# Patient Record
Sex: Male | Born: 1945
Health system: Southern US, Community
[De-identification: ages and names within clinical notes are randomized; demographics above are authoritative.]

## PROBLEM LIST (undated history)

## (undated) DIAGNOSIS — K219 Gastro-esophageal reflux disease without esophagitis: Secondary | ICD-10-CM

## (undated) DIAGNOSIS — I890 Lymphedema, not elsewhere classified: Secondary | ICD-10-CM

## (undated) DIAGNOSIS — M1990 Unspecified osteoarthritis, unspecified site: Secondary | ICD-10-CM

## (undated) DIAGNOSIS — M1712 Unilateral primary osteoarthritis, left knee: Secondary | ICD-10-CM

## (undated) DIAGNOSIS — R609 Edema, unspecified: Secondary | ICD-10-CM

## (undated) DIAGNOSIS — F329 Major depressive disorder, single episode, unspecified: Secondary | ICD-10-CM

## (undated) DIAGNOSIS — M5137 Other intervertebral disc degeneration, lumbosacral region: Secondary | ICD-10-CM

## (undated) DIAGNOSIS — E119 Type 2 diabetes mellitus without complications: Secondary | ICD-10-CM

## (undated) DIAGNOSIS — F32A Depression, unspecified: Secondary | ICD-10-CM

## (undated) DIAGNOSIS — I509 Heart failure, unspecified: Secondary | ICD-10-CM

## (undated) DIAGNOSIS — E1140 Type 2 diabetes mellitus with diabetic neuropathy, unspecified: Secondary | ICD-10-CM

## (undated) DIAGNOSIS — I10 Essential (primary) hypertension: Secondary | ICD-10-CM

## (undated) DIAGNOSIS — K7581 Nonalcoholic steatohepatitis (NASH): Secondary | ICD-10-CM

## (undated) DIAGNOSIS — E785 Hyperlipidemia, unspecified: Secondary | ICD-10-CM

## (undated) DIAGNOSIS — D509 Iron deficiency anemia, unspecified: Secondary | ICD-10-CM

## (undated) DIAGNOSIS — M51379 Other intervertebral disc degeneration, lumbosacral region without mention of lumbar back pain or lower extremity pain: Secondary | ICD-10-CM

## (undated) DIAGNOSIS — M48061 Spinal stenosis, lumbar region without neurogenic claudication: Secondary | ICD-10-CM

## (undated) DIAGNOSIS — J449 Chronic obstructive pulmonary disease, unspecified: Secondary | ICD-10-CM

## (undated) DIAGNOSIS — I5189 Other ill-defined heart diseases: Secondary | ICD-10-CM

## (undated) HISTORY — DX: Type 2 diabetes mellitus with diabetic neuropathy, unspecified: E11.40

## (undated) HISTORY — DX: Hyperlipidemia, unspecified: E78.5

## (undated) HISTORY — DX: Nonalcoholic steatohepatitis (NASH): K75.81

## (undated) HISTORY — DX: Other intervertebral disc degeneration, lumbosacral region: M51.37

## (undated) HISTORY — DX: Chronic obstructive pulmonary disease, unspecified: J44.9

## (undated) HISTORY — DX: Edema, unspecified: R60.9

## (undated) HISTORY — PX: SHOULDER SURGERY: SHX246

## (undated) HISTORY — DX: Other ill-defined heart diseases: I51.89

## (undated) HISTORY — DX: Other intervertebral disc degeneration, lumbosacral region without mention of lumbar back pain or lower extremity pain: M51.379

## (undated) HISTORY — PX: OTHER SURGICAL HISTORY: SHX169

## (undated) HISTORY — DX: Unilateral primary osteoarthritis, left knee: M17.12

## (undated) HISTORY — DX: Spinal stenosis, lumbar region without neurogenic claudication: M48.061

## (undated) HISTORY — PX: HERNIA REPAIR: SHX51

## (undated) HISTORY — PX: TOOTH EXTRACTION: SUR596

---

## 2002-02-19 ENCOUNTER — Emergency Department (HOSPITAL_COMMUNITY): Admission: EM | Admit: 2002-02-19 | Discharge: 2002-02-20 | Payer: Self-pay | Admitting: *Deleted

## 2002-02-20 ENCOUNTER — Encounter: Payer: Self-pay | Admitting: *Deleted

## 2002-04-16 ENCOUNTER — Encounter: Payer: Self-pay | Admitting: Family Medicine

## 2002-04-16 ENCOUNTER — Ambulatory Visit (HOSPITAL_COMMUNITY): Admission: RE | Admit: 2002-04-16 | Discharge: 2002-04-16 | Payer: Self-pay | Admitting: Family Medicine

## 2002-06-02 ENCOUNTER — Encounter: Payer: Self-pay | Admitting: *Deleted

## 2002-06-02 ENCOUNTER — Emergency Department (HOSPITAL_COMMUNITY): Admission: EM | Admit: 2002-06-02 | Discharge: 2002-06-02 | Payer: Self-pay | Admitting: *Deleted

## 2002-06-22 ENCOUNTER — Encounter: Payer: Self-pay | Admitting: Family Medicine

## 2002-06-22 ENCOUNTER — Ambulatory Visit (HOSPITAL_COMMUNITY): Admission: RE | Admit: 2002-06-22 | Discharge: 2002-06-22 | Payer: Self-pay | Admitting: Family Medicine

## 2003-06-24 ENCOUNTER — Ambulatory Visit (HOSPITAL_COMMUNITY): Admission: RE | Admit: 2003-06-24 | Discharge: 2003-06-24 | Payer: Self-pay | Admitting: Internal Medicine

## 2003-06-24 ENCOUNTER — Encounter: Payer: Self-pay | Admitting: Internal Medicine

## 2003-08-19 ENCOUNTER — Ambulatory Visit (HOSPITAL_COMMUNITY): Admission: RE | Admit: 2003-08-19 | Discharge: 2003-08-19 | Payer: Self-pay | Admitting: *Deleted

## 2003-08-26 ENCOUNTER — Ambulatory Visit (HOSPITAL_COMMUNITY): Admission: RE | Admit: 2003-08-26 | Discharge: 2003-08-26 | Payer: Self-pay | Admitting: Internal Medicine

## 2003-10-24 ENCOUNTER — Ambulatory Visit (HOSPITAL_COMMUNITY): Admission: RE | Admit: 2003-10-24 | Discharge: 2003-10-24 | Payer: Self-pay | Admitting: Internal Medicine

## 2004-02-29 ENCOUNTER — Other Ambulatory Visit: Admission: RE | Admit: 2004-02-29 | Discharge: 2004-02-29 | Payer: Self-pay | Admitting: Internal Medicine

## 2004-05-10 ENCOUNTER — Ambulatory Visit (HOSPITAL_COMMUNITY): Admission: RE | Admit: 2004-05-10 | Discharge: 2004-05-10 | Payer: Self-pay | Admitting: Orthopedic Surgery

## 2004-06-15 ENCOUNTER — Ambulatory Visit (HOSPITAL_COMMUNITY): Admission: RE | Admit: 2004-06-15 | Discharge: 2004-06-15 | Payer: Self-pay | Admitting: Orthopedic Surgery

## 2004-06-19 ENCOUNTER — Encounter (HOSPITAL_COMMUNITY): Admission: RE | Admit: 2004-06-19 | Discharge: 2004-06-29 | Payer: Self-pay | Admitting: Orthopedic Surgery

## 2004-07-02 ENCOUNTER — Encounter (HOSPITAL_COMMUNITY): Admission: RE | Admit: 2004-07-02 | Discharge: 2004-08-01 | Payer: Self-pay | Admitting: Orthopedic Surgery

## 2004-08-03 ENCOUNTER — Encounter (HOSPITAL_COMMUNITY): Admission: RE | Admit: 2004-08-03 | Discharge: 2004-09-02 | Payer: Self-pay | Admitting: Orthopedic Surgery

## 2004-08-20 ENCOUNTER — Ambulatory Visit: Payer: Self-pay | Admitting: Orthopedic Surgery

## 2004-08-31 ENCOUNTER — Ambulatory Visit (HOSPITAL_COMMUNITY): Admission: RE | Admit: 2004-08-31 | Discharge: 2004-08-31 | Payer: Self-pay | Admitting: Family Medicine

## 2004-09-03 ENCOUNTER — Encounter (HOSPITAL_COMMUNITY): Admission: RE | Admit: 2004-09-03 | Discharge: 2004-10-03 | Payer: Self-pay | Admitting: Orthopedic Surgery

## 2004-09-06 ENCOUNTER — Ambulatory Visit (HOSPITAL_COMMUNITY): Admission: RE | Admit: 2004-09-06 | Discharge: 2004-09-06 | Payer: Self-pay | Admitting: Family Medicine

## 2004-09-17 ENCOUNTER — Ambulatory Visit: Payer: Self-pay | Admitting: Orthopedic Surgery

## 2004-10-03 ENCOUNTER — Ambulatory Visit: Payer: Self-pay | Admitting: Internal Medicine

## 2004-10-05 ENCOUNTER — Ambulatory Visit (HOSPITAL_COMMUNITY): Admission: RE | Admit: 2004-10-05 | Discharge: 2004-10-05 | Payer: Self-pay | Admitting: Internal Medicine

## 2004-10-05 ENCOUNTER — Encounter (HOSPITAL_COMMUNITY): Admission: RE | Admit: 2004-10-05 | Discharge: 2004-11-04 | Payer: Self-pay | Admitting: Orthopedic Surgery

## 2004-10-08 ENCOUNTER — Ambulatory Visit: Payer: Self-pay | Admitting: Orthopedic Surgery

## 2004-11-05 ENCOUNTER — Ambulatory Visit: Payer: Self-pay | Admitting: Orthopedic Surgery

## 2005-09-25 ENCOUNTER — Encounter (INDEPENDENT_AMBULATORY_CARE_PROVIDER_SITE_OTHER): Payer: Self-pay | Admitting: General Surgery

## 2005-09-25 ENCOUNTER — Ambulatory Visit (HOSPITAL_COMMUNITY): Admission: RE | Admit: 2005-09-25 | Discharge: 2005-09-25 | Payer: Self-pay | Admitting: General Surgery

## 2006-02-28 ENCOUNTER — Ambulatory Visit (HOSPITAL_COMMUNITY): Admission: RE | Admit: 2006-02-28 | Discharge: 2006-02-28 | Payer: Self-pay | Admitting: Family Medicine

## 2006-10-06 ENCOUNTER — Ambulatory Visit: Payer: Self-pay | Admitting: Internal Medicine

## 2006-10-06 DIAGNOSIS — I498 Other specified cardiac arrhythmias: Secondary | ICD-10-CM | POA: Insufficient documentation

## 2006-10-06 LAB — CONVERTED CEMR LAB
Hgb A1c MFr Bld: 7.4 %
Hgb A1c MFr Bld: 7.4 %

## 2006-10-07 ENCOUNTER — Encounter (INDEPENDENT_AMBULATORY_CARE_PROVIDER_SITE_OTHER): Payer: Self-pay | Admitting: Internal Medicine

## 2006-10-07 LAB — CONVERTED CEMR LAB
ALT: 54 units/L — ABNORMAL HIGH (ref 0–53)
AST: 38 units/L — ABNORMAL HIGH (ref 0–37)
Albumin: 4.7 g/dL (ref 3.5–5.2)
Alkaline Phosphatase: 73 units/L (ref 39–117)
BUN: 18 mg/dL (ref 6–23)
Basophils Absolute: 0.1 10*3/uL (ref 0.0–0.1)
Basophils Relative: 2 % — ABNORMAL HIGH (ref 0–1)
CO2: 22 meq/L (ref 19–32)
Calcium: 9.9 mg/dL (ref 8.4–10.5)
Chloride: 105 meq/L (ref 96–112)
Cholesterol: 252 mg/dL — ABNORMAL HIGH (ref 0–200)
Creatinine, Ser: 0.62 mg/dL (ref 0.40–1.50)
Eosinophils Relative: 2 % (ref 0–5)
Glucose, Bld: 172 mg/dL — ABNORMAL HIGH (ref 70–99)
HCT: 46.3 % (ref 39.0–52.0)
HDL: 52 mg/dL (ref >39)
Hemoglobin: 15.9 g/dL (ref 13.0–17.0)
LDL Cholesterol: 136 mg/dL — ABNORMAL HIGH (ref 0–99)
Lymphocytes Relative: 30 % (ref 12–46)
Lymphs Abs: 2.7 10*3/uL (ref 0.7–3.3)
MCHC: 34.3 g/dL (ref 30.0–36.0)
MCV: 85.4 fL (ref 78.0–100.0)
Monocytes Absolute: 0.6 10*3/uL (ref 0.2–0.7)
Monocytes Relative: 7 % (ref 3–11)
Neutro Abs: 5.3 10*3/uL (ref 1.7–7.7)
Neutrophils Relative %: 60 % (ref 43–77)
Platelets: 303 10*3/uL (ref 150–400)
Potassium: 4.4 meq/L (ref 3.5–5.3)
RBC: 5.42 M/uL (ref 4.22–5.81)
RDW: 14.2 % — ABNORMAL HIGH (ref 11.5–14.0)
Sodium: 143 meq/L (ref 135–145)
TSH: 2.718 microintl units/mL (ref 0.350–5.50)
Total Bilirubin: 0.4 mg/dL (ref 0.3–1.2)
Total CHOL/HDL Ratio: 4.8
Total Protein: 7.3 g/dL (ref 6.0–8.3)
Triglycerides: 321 mg/dL — ABNORMAL HIGH (ref <150)
VLDL: 64 mg/dL — ABNORMAL HIGH (ref 0–40)
WBC: 8.8 10*3/uL (ref 4.0–10.5)

## 2006-10-14 ENCOUNTER — Ambulatory Visit: Payer: Self-pay | Admitting: Internal Medicine

## 2006-10-15 ENCOUNTER — Encounter: Payer: Self-pay | Admitting: Internal Medicine

## 2006-10-15 DIAGNOSIS — G5600 Carpal tunnel syndrome, unspecified upper limb: Secondary | ICD-10-CM

## 2006-10-15 DIAGNOSIS — K219 Gastro-esophageal reflux disease without esophagitis: Secondary | ICD-10-CM | POA: Insufficient documentation

## 2006-10-15 DIAGNOSIS — E785 Hyperlipidemia, unspecified: Secondary | ICD-10-CM

## 2006-10-15 DIAGNOSIS — I10 Essential (primary) hypertension: Secondary | ICD-10-CM | POA: Insufficient documentation

## 2006-10-17 ENCOUNTER — Ambulatory Visit (HOSPITAL_COMMUNITY): Admission: RE | Admit: 2006-10-17 | Discharge: 2006-10-17 | Payer: Self-pay | Admitting: Internal Medicine

## 2006-11-13 ENCOUNTER — Ambulatory Visit: Payer: Self-pay | Admitting: Internal Medicine

## 2006-11-19 ENCOUNTER — Ambulatory Visit: Payer: Self-pay | Admitting: Internal Medicine

## 2006-11-19 DIAGNOSIS — B9789 Other viral agents as the cause of diseases classified elsewhere: Secondary | ICD-10-CM | POA: Insufficient documentation

## 2006-11-19 LAB — CONVERTED CEMR LAB: Inflenza A Ag: NEGATIVE

## 2007-01-01 ENCOUNTER — Ambulatory Visit: Payer: Self-pay | Admitting: Internal Medicine

## 2007-01-01 LAB — CONVERTED CEMR LAB: Hgb A1c MFr Bld: 7.4 %

## 2007-01-05 LAB — CONVERTED CEMR LAB
Creatinine, Urine: 62.3 mg/dL
Microalb Creat Ratio: 15.2 mg/g (ref 0.0–30.0)
Microalb, Ur: 0.95 mg/dL (ref 0.00–1.89)

## 2007-01-06 ENCOUNTER — Encounter (INDEPENDENT_AMBULATORY_CARE_PROVIDER_SITE_OTHER): Payer: Self-pay | Admitting: Internal Medicine

## 2007-01-08 LAB — CONVERTED CEMR LAB
ALT: 58 units/L — ABNORMAL HIGH (ref 0–53)
AST: 42 units/L — ABNORMAL HIGH (ref 0–37)
Albumin: 4.4 g/dL (ref 3.5–5.2)
Alkaline Phosphatase: 60 units/L (ref 39–117)
BUN: 10 mg/dL (ref 6–23)
CO2: 25 meq/L (ref 19–32)
Calcium: 9.6 mg/dL (ref 8.4–10.5)
Chloride: 102 meq/L (ref 96–112)
Cholesterol: 184 mg/dL (ref 0–200)
Creatinine, Ser: 0.66 mg/dL (ref 0.40–1.50)
Creatinine, Urine: 126 mg/dL
Glucose, Bld: 160 mg/dL — ABNORMAL HIGH (ref 70–99)
HDL: 46 mg/dL (ref >39)
LDL Cholesterol: 111 mg/dL — ABNORMAL HIGH (ref 0–99)
Microalb Creat Ratio: 22.2 mg/g (ref 0.0–30.0)
Microalb, Ur: 2.8 mg/dL — ABNORMAL HIGH (ref 0.00–1.89)
Potassium: 4.7 meq/L (ref 3.5–5.3)
Sodium: 143 meq/L (ref 135–145)
Total Bilirubin: 0.3 mg/dL (ref 0.3–1.2)
Total CHOL/HDL Ratio: 4
Total Protein: 6.4 g/dL (ref 6.0–8.3)
Triglycerides: 134 mg/dL (ref <150)
VLDL: 27 mg/dL (ref 0–40)

## 2007-03-05 ENCOUNTER — Ambulatory Visit: Payer: Self-pay | Admitting: Internal Medicine

## 2007-03-05 ENCOUNTER — Ambulatory Visit (HOSPITAL_COMMUNITY): Admission: RE | Admit: 2007-03-05 | Discharge: 2007-03-05 | Payer: Self-pay | Admitting: Internal Medicine

## 2007-05-20 ENCOUNTER — Ambulatory Visit: Payer: Self-pay | Admitting: Internal Medicine

## 2007-05-20 DIAGNOSIS — E1149 Type 2 diabetes mellitus with other diabetic neurological complication: Secondary | ICD-10-CM

## 2007-05-20 LAB — CONVERTED CEMR LAB
Blood Glucose, Fingerstick: 184
Hgb A1c MFr Bld: 7.7 %

## 2007-05-21 ENCOUNTER — Encounter (INDEPENDENT_AMBULATORY_CARE_PROVIDER_SITE_OTHER): Payer: Self-pay | Admitting: Internal Medicine

## 2007-05-21 ENCOUNTER — Telehealth (INDEPENDENT_AMBULATORY_CARE_PROVIDER_SITE_OTHER): Payer: Self-pay | Admitting: *Deleted

## 2007-05-21 LAB — CONVERTED CEMR LAB
HCV Ab: NEGATIVE
Hep B S Ab: NEGATIVE
Hepatitis B Surface Ag: NEGATIVE

## 2007-05-22 ENCOUNTER — Telehealth (INDEPENDENT_AMBULATORY_CARE_PROVIDER_SITE_OTHER): Payer: Self-pay | Admitting: *Deleted

## 2007-05-22 DIAGNOSIS — K7581 Nonalcoholic steatohepatitis (NASH): Secondary | ICD-10-CM | POA: Insufficient documentation

## 2007-05-22 LAB — CONVERTED CEMR LAB
ALT: 80 units/L — ABNORMAL HIGH (ref 0–53)
AST: 75 units/L — ABNORMAL HIGH (ref 0–37)
Albumin: 4.6 g/dL (ref 3.5–5.2)
Alkaline Phosphatase: 64 units/L (ref 39–117)
BUN: 11 mg/dL (ref 6–23)
Basophils Absolute: 0.1 10*3/uL (ref 0.0–0.1)
Basophils Relative: 2 % — ABNORMAL HIGH (ref 0–1)
CO2: 24 meq/L (ref 19–32)
Calcium: 9.7 mg/dL (ref 8.4–10.5)
Chloride: 103 meq/L (ref 96–112)
Cholesterol: 176 mg/dL (ref 0–200)
Creatinine, Ser: 0.64 mg/dL (ref 0.40–1.50)
Eosinophils Absolute: 0.2 10*3/uL (ref 0.0–0.7)
Eosinophils Relative: 3 % (ref 0–5)
Glucose, Bld: 161 mg/dL — ABNORMAL HIGH (ref 70–99)
HCT: 44.9 % (ref 39.0–52.0)
HDL: 46 mg/dL (ref >39)
Hemoglobin: 14 g/dL (ref 13.0–17.0)
LDL Cholesterol: 97 mg/dL (ref 0–99)
Lymphocytes Relative: 36 % (ref 12–46)
Lymphs Abs: 2 10*3/uL (ref 0.7–3.3)
MCHC: 31.2 g/dL (ref 30.0–36.0)
MCV: 87 fL (ref 78.0–100.0)
Monocytes Absolute: 0.3 10*3/uL (ref 0.2–0.7)
Monocytes Relative: 6 % (ref 3–11)
Neutro Abs: 2.9 10*3/uL (ref 1.7–7.7)
Neutrophils Relative %: 53 % (ref 43–77)
Platelets: 250 10*3/uL (ref 150–400)
Potassium: 4.7 meq/L (ref 3.5–5.3)
RBC: 5.16 M/uL (ref 4.22–5.81)
RDW: 15.1 % — ABNORMAL HIGH (ref 11.5–14.0)
Sodium: 140 meq/L (ref 135–145)
Total Bilirubin: 0.6 mg/dL (ref 0.3–1.2)
Total CHOL/HDL Ratio: 3.8
Total Protein: 6.8 g/dL (ref 6.0–8.3)
Triglycerides: 163 mg/dL — ABNORMAL HIGH (ref <150)
VLDL: 33 mg/dL (ref 0–40)
WBC: 5.4 10*3/uL (ref 4.0–10.5)

## 2007-06-22 ENCOUNTER — Encounter (INDEPENDENT_AMBULATORY_CARE_PROVIDER_SITE_OTHER): Payer: Self-pay | Admitting: Internal Medicine

## 2007-08-21 ENCOUNTER — Encounter (INDEPENDENT_AMBULATORY_CARE_PROVIDER_SITE_OTHER): Payer: Self-pay | Admitting: Family Medicine

## 2007-09-01 ENCOUNTER — Telehealth (INDEPENDENT_AMBULATORY_CARE_PROVIDER_SITE_OTHER): Payer: Self-pay | Admitting: *Deleted

## 2007-09-01 ENCOUNTER — Encounter (INDEPENDENT_AMBULATORY_CARE_PROVIDER_SITE_OTHER): Payer: Self-pay | Admitting: Internal Medicine

## 2007-09-14 ENCOUNTER — Encounter (INDEPENDENT_AMBULATORY_CARE_PROVIDER_SITE_OTHER): Payer: Self-pay | Admitting: Family Medicine

## 2008-03-08 ENCOUNTER — Encounter (INDEPENDENT_AMBULATORY_CARE_PROVIDER_SITE_OTHER): Payer: Self-pay | Admitting: Internal Medicine

## 2009-09-30 HISTORY — PX: OTHER SURGICAL HISTORY: SHX169

## 2009-09-30 HISTORY — PX: COLONOSCOPY: SHX174

## 2011-02-15 NOTE — Procedures (Signed)
NAME:  ZAYVEN, POWE NO.:  [id number]   MEDICAL RECORD NO.:  [id number]          PATIENT TYPE:  OUT   LOCATION:  RAD                           FACILITY:  [facility]   PHYSICIAN:  Fay Records, MD, FACCDATE OF BIRTH:  Jul 24, 1946   DATE OF PROCEDURE:  10/17/2006  DATE OF DISCHARGE:                                ECHOCARDIOGRAM   TEST INDICATIONS:  The patient is a 65 year old with a history of  palpitations, tachycardia, and hypertension.   2D ECHO WITH ECHO DOPPLER:  Left ventricle is mildly thickened,  interventricular septum at 14 mm, posterior wall at 15 mm.  Left atrium  is mildly dilated. Right atrium and right ventricle are normal.   The aortic root is normal.   The aortic valve is mildly thickened.  There is no insufficiency.  No  stenosis.  The mitral valve is mildly thickened with mild annular  calcification.  There is no insufficiency.  The pulmonic valve is  normal.  No insufficiency.  The tricuspid valve is normal with no  insufficiency.   Overall LV systolic function is normal with an LVEF of approximately  60%.  RVEF is normal.  Note very mild diastolic dysfunction.  No  pericardial effusion is seen.      Fay Records, MD, [facility]  Electronically Signed     PVR/MEDQ  D:  10/17/2006  T:  10/17/2006  Job:  [id number]

## 2011-02-15 NOTE — Op Note (Signed)
NAME:  Michael [name], SCHEFFEL NO.:  [id number]   MEDICAL RECORD NO.:  [id number]          PATIENT TYPE:  AMB   LOCATION:  DAY                           FACILITY:  [facility]   PHYSICIAN:  Jamesetta So, M.D.  DATE OF BIRTH:  [birth year]   DATE OF PROCEDURE:  09/25/2005  DATE OF DISCHARGE:                                 OPERATIVE REPORT   PREOPERATIVE DIAGNOSIS:  Ventral hernia.   POSTOPERATIVE DIAGNOSIS:  Ventral hernia.   PROCEDURE:  Ventral herniorrhaphy with mesh.   SURGEON:  Dr. Aviva Signs.   ANESTHESIA:  General endotracheal.   INDICATIONS:  The patient is a 65 year old white male who is referred for  evaluation and treatment of a ventral hernia. Risks and benefits of the  procedure including bleeding, infection and recurrence of the hernia were  fully explained to the patient, who gave informed consent.   PROCEDURE NOTE:  The patient was placed in the supine position. After  induction of general endotracheal anesthesia, the abdomen was prepped and  draped using the usual sterile technique with Betadine. Surgical site  confirmation was performed.   A midline incision was made just above the umbilicus. This was taken down to  the fascia. The patient was noted to have a hernia sac with omentum within  it. The hernia sac was excised to the fascial edges without difficulty. It  was sent to pathology for further examination. The omentum was reduced into  the abdominal cavity. Polypropylene mesh patch was then secured  circumferentially to the fascia using 0 Prolene interrupted sutures. This  defect measured approximately 3 cm in its greatest diameter. The  subcutaneous layer was reapproximated using 2-0 Vicryl interrupted sutures.  The skin was closed using staples. Sensorcaine 0.5% was instilled into the  surrounding wound. Betadine ointment and dry sterile dressing were applied.   All tape and needle counts were correct at the end of the procedure. The  patient  was extubated in the operating room and went back to recovery room  awake in stable condition.   COMPLICATIONS:  None.   SPECIMEN:  Hernia sac.   BLOOD LOSS:  Minimal.      Jamesetta So, M.D.  Electronically Signed     MAJ/MEDQ  D:  09/25/2005  T:  09/25/2005  Job:  [id number]

## 2011-02-15 NOTE — H&P (Signed)
NAME:  Michael [name], Michael [name] NO.:  [id number]   MEDICAL RECORD NO.:  [id number]          PATIENT TYPE:  AMB   LOCATION:                                FACILITY:  [facility]   PHYSICIAN:  Jamesetta So, M.D.  DATE OF BIRTH:  01/08/1946   DATE OF ADMISSION:  DATE OF DISCHARGE:  LH                                HISTORY & PHYSICAL   CHIEF COMPLAINT:  Ventral hernia.   HISTORY OF PRESENT ILLNESS:  The patient is a 65 year old white male who is  referred for evaluation and treatment of a ventral hernia. It has been  present for some time but is increasing in size and is causing him  discomfort. It is made worse with straining.   PAST MEDICAL HISTORY:  1.  Hypertension.  2.  Noninsulin-dependent diabetes mellitus.   PAST SURGICAL HISTORY:  Shoulder surgery.   CURRENT MEDICATIONS:  Januvia, Lotrel, Vytorin, Nexium.   ALLERGIES:  No known drug allergies.   REVIEW OF SYSTEMS:  The patient denies drinking or smoking.   PHYSICAL EXAMINATION:  GENERAL:  The patient is a well-developed, well-  nourished, white male in no acute distress.  LUNGS:  Clear to auscultation with equal breath sounds bilaterally.  HEART:  Reveals regular rate and rhythm without S3, S4, or murmurs.  ABDOMEN:  Soft and nondistended. He is tender in the supraumbilical region  with a hernia present with 5 cm in size. No hepatosplenomegaly or masses are  noted.   IMPRESSION:  Ventral hernia.   PLAN:  The patient is scheduled for ventral herniorrhaphy on September 25, 2005. The risks and benefits of the procedure including bleeding, infection  and the possibility of recurrence of the hernia were fully explained to the  patient, who gave informed consent.      Jamesetta So, M.D.  Electronically Signed     MAJ/MEDQ  D:  09/12/2005  T:  09/12/2005  Job:  [id number]   cc:   [facility]  Fax: [phone]   Leonides Grills, M.D.  Fax: [phone]

## 2012-05-07 ENCOUNTER — Encounter: Payer: Self-pay | Admitting: Family Medicine

## 2012-05-07 ENCOUNTER — Ambulatory Visit (INDEPENDENT_AMBULATORY_CARE_PROVIDER_SITE_OTHER): Payer: Medicare Other | Admitting: Family Medicine

## 2012-05-07 ENCOUNTER — Ambulatory Visit (HOSPITAL_COMMUNITY)
Admission: RE | Admit: 2012-05-07 | Discharge: 2012-05-07 | Disposition: A | Discharge status: Home / Self Care | Payer: Medicare Other | Source: Ambulatory Visit | Attending: Family Medicine | Admitting: Family Medicine

## 2012-05-07 VITALS — BP 120/70 | HR 103 | Resp 16 | Ht 63.0 in | Wt 211.4 lb

## 2012-05-07 DIAGNOSIS — M25569 Pain in unspecified knee: Secondary | ICD-10-CM

## 2012-05-07 DIAGNOSIS — E785 Hyperlipidemia, unspecified: Secondary | ICD-10-CM

## 2012-05-07 DIAGNOSIS — E119 Type 2 diabetes mellitus without complications: Secondary | ICD-10-CM

## 2012-05-07 DIAGNOSIS — I10 Essential (primary) hypertension: Secondary | ICD-10-CM

## 2012-05-07 DIAGNOSIS — M25561 Pain in right knee: Secondary | ICD-10-CM

## 2012-05-07 NOTE — Patient Instructions (Addendum)
Get the xrays done of the knees-  Continue the current medications Call me when you get the Medicaid F/U 8 weeks

## 2012-05-07 NOTE — Progress Notes (Signed)
  Subjective:    Patient ID: Michael [name], male    DOB: 1946/02/24, 66 y.o.   MRN: [id number]  HPI Pt here to establish care, [place] vet- [employer], followed at [facility] Medications and History reviewed Now on medicare and wishes to establish locally as transportation to [place] very difficult. Chronic knee pain- history of bone spur and OA, given NSAIDS and ultram , has not seen ortho because of waiting list at [facility] DM- last A1C 6.7 in June 2013, has neuropathy as well, on Novolog 70/30 17 units BID Anemia- had transfuion in 2011, seen by hematology, ? Slow GI bleed, had capsule endoscopy, colonoscopy and EGD all normal per report    Review of Systems   GEN- denies fatigue, fever, weight loss,weakness, recent illness HEENT- denies eye drainage, change in vision, nasal discharge, CVS- denies chest pain, palpitations RESP- denies SOB, cough, wheeze ABD- denies N/V, change in stools, abd pain GU- denies dysuria, hematuria, dribbling, incontinence MSK- + joint pain, muscle aches, injury Neuro- denies headache, dizziness, syncope, seizure activity      Objective:   Physical Exam GEN- NAD, alert and oriented x3,  HEENT- PERRL, EOMI, non injected sclera, pink conjunctiva, MMM, oropharynx clear Neck- Supple,  CVS- RRR, no murmur RESP-CTAB ABD-NABS,soft,NT,ND  EXT-trace edema, small varicose veins, mild erythematous hyperpigementation, TTP from calf to ankle  Pulses- Radial, DP- 2+ MSK- Bilateral knee- normal inspection,no effusion, no crepitus, TTP medial and inferior aspects L>R antaglic gait       Assessment & Plan:

## 2012-05-10 ENCOUNTER — Encounter: Payer: Self-pay | Admitting: Family Medicine

## 2012-05-10 NOTE — Assessment & Plan Note (Signed)
Well controlled, no change to meds 

## 2012-05-10 NOTE — Assessment & Plan Note (Signed)
Last A1C looks good, continue 70/30 he has precison xtra monitoring system

## 2012-05-10 NOTE — Assessment & Plan Note (Signed)
Obtain last lipid profile on statin therapy

## 2012-05-10 NOTE — Assessment & Plan Note (Signed)
Xrays show mild arthritis changes, he wants to establish medicaid before seeing ortho for ongoing pain, needs weight loss Continue ultram

## 2012-05-10 NOTE — Assessment & Plan Note (Signed)
Little activity with knee pain

## 2012-06-11 ENCOUNTER — Encounter: Payer: Self-pay | Admitting: Family Medicine

## 2012-06-11 ENCOUNTER — Ambulatory Visit (INDEPENDENT_AMBULATORY_CARE_PROVIDER_SITE_OTHER): Payer: Medicare Other | Admitting: Family Medicine

## 2012-06-11 VITALS — BP 140/74 | HR 100 | Resp 18 | Ht 63.0 in | Wt 211.0 lb

## 2012-06-11 DIAGNOSIS — E119 Type 2 diabetes mellitus without complications: Secondary | ICD-10-CM

## 2012-06-11 DIAGNOSIS — M25561 Pain in right knee: Secondary | ICD-10-CM

## 2012-06-11 DIAGNOSIS — M79606 Pain in leg, unspecified: Secondary | ICD-10-CM

## 2012-06-11 DIAGNOSIS — M25569 Pain in unspecified knee: Secondary | ICD-10-CM

## 2012-06-11 DIAGNOSIS — M79609 Pain in unspecified limb: Secondary | ICD-10-CM

## 2012-06-11 DIAGNOSIS — Z23 Encounter for immunization: Secondary | ICD-10-CM

## 2012-06-11 DIAGNOSIS — Z79899 Other long term (current) drug therapy: Secondary | ICD-10-CM

## 2012-06-11 MED ORDER — HYDROCODONE-ACETAMINOPHEN 5-500 MG PO TABS: 1.0000 | ORAL_TABLET | Freq: Four times a day (QID) | ORAL | Status: DC | PRN

## 2012-06-11 NOTE — Patient Instructions (Addendum)
I will refer you to orthopedics for your knees Start the pain medication as needed I will have the blood flow checked in your legs  Flu shot given/ pneumonia vaccine Get the labs done fasting- we will call with results  Changes f/u in 2 months

## 2012-06-13 LAB — CBC
Hemoglobin: 11.7 g/dL — ABNORMAL LOW (ref 13.0–17.0)
MCHC: 33.5 g/dL (ref 30.0–36.0)
Platelets: 239 10*3/uL (ref 150–400)
RDW: 16.2 % — ABNORMAL HIGH (ref 11.5–15.5)

## 2012-06-13 LAB — COMPREHENSIVE METABOLIC PANEL
Alkaline Phosphatase: 53 U/L (ref 39–117)
Creat: 0.75 mg/dL (ref 0.50–1.35)
Glucose, Bld: 142 mg/dL — ABNORMAL HIGH (ref 70–99)
Sodium: 139 mEq/L (ref 135–145)
Total Bilirubin: 0.5 mg/dL (ref 0.3–1.2)
Total Protein: 6.9 g/dL (ref 6.0–8.3)

## 2012-06-13 LAB — LIPID PANEL
LDL Cholesterol: 75 mg/dL (ref 0–99)
Total CHOL/HDL Ratio: 3.2 Ratio
Triglycerides: 108 mg/dL (ref <150)
VLDL: 22 mg/dL (ref 0–40)

## 2012-06-13 LAB — VITAMIN B12: Vitamin B-12: 483 pg/mL (ref 211–911)

## 2012-06-13 LAB — HEMOGLOBIN A1C: Mean Plasma Glucose: 160 mg/dL — ABNORMAL HIGH (ref <117)

## 2012-06-14 ENCOUNTER — Encounter: Payer: Self-pay | Admitting: Family Medicine

## 2012-06-14 NOTE — Assessment & Plan Note (Signed)
I think his pain in calves and thighs may be referred from knee, he is diabetic, therefore with fatigue with walking will check ABI

## 2012-06-14 NOTE — Progress Notes (Signed)
  Subjective:    Patient ID: Michael [name], male    DOB: 04-May-1946, 66 y.o.   MRN: [id number]  HPI Pt presents with continue knee pain L>R, his legs are also painful,they feels very tired after walking, his knee pops and gives out on him with walking, no swelling. Ultram did not help   Review of Systems - per above  GEN- denies fatigue, fever, weight loss,weakness, recent illness HEENT- denies eye drainage, change in vision, nasal discharge, CVS- denies chest pain, palpitations GU- denies dysuria, hematuria, dribbling, incontinence MSK- + joint pain, muscle aches, injury       Objective:   Physical Exam  GEN-NAD, alert and oriented x3, short stature MSK- Bilateral knee- normal inspection, while ambulating left knee gave out on pt in office, no swelling,liagmens in tact, TTP 2cm above knee, medal and lateral aspects Ext-trace pedal edema, neg homans Pulses- equal bilat      Assessment & Plan:

## 2012-06-14 NOTE — Assessment & Plan Note (Signed)
Vicodin given, referral to ortho

## 2012-07-01 ENCOUNTER — Encounter: Payer: Self-pay | Admitting: Orthopedic Surgery

## 2012-07-01 ENCOUNTER — Ambulatory Visit (INDEPENDENT_AMBULATORY_CARE_PROVIDER_SITE_OTHER): Payer: Medicare Other | Admitting: Orthopedic Surgery

## 2012-07-01 VITALS — BP 138/60 | Ht 63.0 in | Wt 211.0 lb

## 2012-07-01 DIAGNOSIS — M7650 Patellar tendinitis, unspecified knee: Secondary | ICD-10-CM | POA: Insufficient documentation

## 2012-07-01 MED ORDER — DICLOFENAC SODIUM 1 % TD GEL: 1.0000 "application " | Freq: Four times a day (QID) | TRANSDERMAL | Status: DC

## 2012-07-01 NOTE — Patient Instructions (Addendum)
Patellar Tendinitis, Jumper's Knee with Rehab Tendinitis is inflammation of a tendon. Tendonitis of the tendon below the kneecap (patella) is known as patellar tendonitis. Patellar tendonitis is a common cause of pain below the kneecap (infrapatella). Patellar tendonitis may involve a tear (strain) in the ligament. Strains are classified into three categories. Grade 1 strains cause pain, but the tendon is not lengthened. Grade 2 strains include a lengthened ligament, due to the ligament being stretched or partially ruptured. With grade 2 strains there is still function, although function may be decreased. Grade 3 strains involve a complete tear of the tendon or muscle, and function is usually impaired. Patellar tendon strains are usually grade 1 or 2.   SYMPTOMS    Pain, tenderness, swelling, warmth, or redness over the patellar tendon (just below the kneecap).   Pain and loss of strength (sometimes), with forcefully straightening the knee (especially when jumping or rising from a seated or squatting position), or bending the knee completely (squatting or kneeling).   Crackling sound (crepitation) when the tendon is moved or touched.  CAUSES   Patellar tendonitis is caused by injury to the patellar tendon. The inflammation is the body's healing response. Common causes of injury include:  Stress from a sudden increase in intensity, frequency, or duration of training.   Overuse of the quadriceps thigh muscles and patellar tendon.   Direct hit (trauma) to the knee or patellar tendon.  RISK INCREASES WITH:  Sports that require sudden, explosive thigh muscle (quadriceps) contraction, such as jumping, quick starts, or kicking.   Running sports, especially running down hills.   Poor strength and flexibility of the thigh and knee.   Flat feet.  PREVENTION  Warm up and stretch properly before activity.   Allow for adequate recovery between workouts.   Maintain physical fitness:   Strength,  flexibility, and endurance.   Cardiovascular fitness.   Protect the knee joint with taping, protective strapping, bracing, or elastic compression bandage.   Wear arch supports (orthotics).  PROGNOSIS   If treated properly, patellar tendonitis usually heals within 6 weeks.   RELATED COMPLICATIONS    Longer healing time, if not properly treated or if not given enough time to heal.   Recurring symptoms, if activity is resumed too soon, with overuse, with a direct blow, or when using poor technique.   If untreated, tendon rupture requiring surgery.  TREATMENT Treatment first involves the use of ice and medicine, to reduce pain and inflammation. The use of strengthening and stretching exercises may help reduce pain with activity. These exercises may be performed at home or with a therapist. Serious cases of tendonitis may require restraining the knee for 10 to 14 days, to prevent stress on the tendon and to promote healing. Crutches may be used (uncommon) until you can walk without a limp. For cases in which non-surgical treatment is unsuccessful, surgery may be advised, to remove the inflamed tendon lining (sheath). Surgery is rare, and is only advised after at least 6 months of non-surgical treatment. MEDICATION    If pain medicine is needed, nonsteroidal anti-inflammatory medicines (aspirin and ibuprofen), or other minor pain relievers (acetaminophen), are often advised.   Do not take pain medicine for 7 days before surgery.   Prescription pain relievers may be given, if your caregiver thinks they are needed. Use only as directed and only as much as you need.  HEAT AND COLD  Cold treatment (icing) should be applied for 10 to 15 minutes every 2 to 3  hours for inflammation and pain, and immediately after activity that aggravates your symptoms. Use ice packs or an ice massage.   Heat treatment may be used before performing stretching and strengthening activities prescribed by your caregiver,  physical therapist, or athletic trainer. Use a heat pack or a warm water soak.  SEEK MEDICAL CARE IF:  Symptoms get worse or do not improve in 2 weeks, despite treatment.   New, unexplained symptoms develop. (Drugs used in treatment may produce side effects.)    Start PT, call hospital   You have received a steroid shot. 15% of patients experience increased pain at the injection site with in the next 24 hours. This is best treated with ice and tylenol extra strength 2 tabs every 8 hours. If you are still having pain please call the office.

## 2012-07-01 NOTE — Progress Notes (Signed)
Patient ID: Michael [name], male   DOB: April 10, 1946, 66 y.o.   MRN: [id number] Chief Complaint  Patient presents with  . Knee Pain    bilateral knee pain x 1 year, gradual onset, no known injury    REFERRAL : DR Buelah Manis   The patient complains of bilateral anterior knee pain, which started a year ago, and worse over the last 6 months unrelieved by tramadol, and hydrocodone.  He has trouble climbing steps. He is having pain at night with catching. The LEFT knee is worse than the RIGHT. His pain is described as sharp, dull, throbbing, stabbing, and 10 out of 10, and it has become constant. He is also on Lodine, and that hasn't helped as well.  History of fatigue and joint pain joint swelling muscle pain. Otherwise, he lists his review of systems as normal. Current Outpatient Prescriptions on File Prior to Visit  Medication Sig Dispense Refill  . albuterol (PROVENTIL HFA;VENTOLIN HFA) 108 (90 BASE) MCG/ACT inhaler Inhale 2 puffs into the lungs every 6 (six) hours as needed.      Marland Kitchen amLODipine (NORVASC) 10 MG tablet Take 10 mg by mouth daily.      Marland Kitchen aspirin 81 MG tablet Take 81 mg by mouth daily.      . benazepril (LOTENSIN) 40 MG tablet Take 40 mg by mouth daily.      . cloNIDine (CATAPRES) 0.1 MG tablet Take 0.1 mg by mouth 2 (two) times daily.      Marland Kitchen etodolac (LODINE) 400 MG tablet Take 400 mg by mouth 2 (two) times daily.      . ferrous sulfate 325 (65 FE) MG tablet Take 325 mg by mouth. 2 tabs three times daily      . gabapentin (NEURONTIN) 300 MG capsule Take 300 mg by mouth 3 (three) times daily.      Marland Kitchen glipiZIDE (GLUCOTROL) 5 MG tablet Take 10 mg by mouth 2 (two) times daily before a meal.      . hydrochlorothiazide (HYDRODIURIL) 25 MG tablet Take 25 mg by mouth daily.      Marland Kitchen HYDROcodone-acetaminophen (VICODIN) 5-500 MG per tablet Take 1 tablet by mouth every 6 (six) hours as needed for pain.  30 tablet  1  . insulin aspart protamine-insulin aspart (NOVOLOG 70/30) (70-30) 100 UNIT/ML  injection Inject 17 Units into the skin 2 (two) times daily with a meal.      . metFORMIN (GLUCOPHAGE) 1000 MG tablet Take 1,000 mg by mouth 2 (two) times daily with a meal.      . omeprazole (PRILOSEC) 20 MG capsule Take 20 mg by mouth 2 (two) times daily.      . simvastatin (ZOCOR) 40 MG tablet Take 20 mg by mouth every evening.      . traMADol (ULTRAM) 50 MG tablet Take 50 mg by mouth 3 (three) times daily.      . vitamin C (ASCORBIC ACID) 500 MG tablet Take 500 mg by mouth daily.        Past Medical History  Diagnosis Date  . COPD (chronic obstructive pulmonary disease)   . Diabetes mellitus   . Hyperlipidemia   . Hypertension   . Anemia   . NASH (nonalcoholic steatohepatitis)    Past Surgical History  Procedure Date  . Hernia repair   . Shoulder surgery     right  . Laparoscopy     stomach    BP 138/60  Ht 5' 3"  (1.6 m)  Wt 211 lb (  95.709 kg)  BMI 37.38 kg/m2  Vital signs are stable as recorded  General appearance is normal  The patient is alert and oriented x3  The patient's mood and affect are normal  Gait assessment: difficulty with ambulation on with significant limp  The cardiovascular exam reveals normal pulses and temperature without edema swelling.  The lymphatic system is negative for palpable lymph nodes  The sensory exam is normal.  There are no pathologic reflexes.  Balance is normal.   Exam of the knees Inspection anterior knee pain and tenderness over the patellar tendon  Range of motion normal  Stability normal ligaments  Strength normal  Skin normal   (X-rays were done at the hospital: I've reviewed the report and the films and I agree with the report )  Minimal degenerative changes in both x-rays.  Impression bilateral patellar tendinitis.  Recommend physical therapy.  Bilateral cortisone injections.  voltaren Gel 4 times a day.  Followup 3 months   Knee  Injection Procedure Note  Pre-operative Diagnosis: left knee oa    Post-operative Diagnosis: same  Indications: pain  Anesthesia: ethyl chloride   Procedure Details   Verbal consent was obtained for the procedure. Time out was completed.The joint was prepped with alcohol, followed by  Ethyl chloride spray and A 20 gauge needle was inserted into the knee via lateral approach; 49m 1% lidocaine and 1 ml of depomedrol  was then injected into the joint . The needle was removed and the area cleansed and dressed.  Complications:  None; patient tolerated the procedure well. Knee  Injection Procedure Note  Pre-operative Diagnosis: right knee oa  Post-operative Diagnosis: same  Indications: pain  Anesthesia: ethyl chloride   Procedure Details   Verbal consent was obtained for the procedure. Time out was completed.The joint was prepped with alcohol, followed by  Ethyl chloride spray and A 20 gauge needle was inserted into the knee via lateral approach; 471m1% lidocaine and 1 ml of depomedrol  was then injected into the joint . The needle was removed and the area cleansed and dressed.  Complications:  None; patient tolerated the procedure well.

## 2012-07-02 ENCOUNTER — Ambulatory Visit: Payer: Medicare Other | Admitting: Family Medicine

## 2012-07-02 ENCOUNTER — Other Ambulatory Visit: Payer: Self-pay | Admitting: *Deleted

## 2012-07-02 DIAGNOSIS — M7650 Patellar tendinitis, unspecified knee: Secondary | ICD-10-CM

## 2012-07-03 ENCOUNTER — Encounter: Payer: Self-pay | Admitting: Family Medicine

## 2012-07-03 ENCOUNTER — Other Ambulatory Visit: Payer: Self-pay | Admitting: Family Medicine

## 2012-07-03 ENCOUNTER — Ambulatory Visit (INDEPENDENT_AMBULATORY_CARE_PROVIDER_SITE_OTHER): Payer: Medicare Other | Admitting: Family Medicine

## 2012-07-03 ENCOUNTER — Telehealth: Payer: Self-pay | Admitting: Family Medicine

## 2012-07-03 VITALS — BP 146/78 | HR 98 | Resp 15 | Ht 63.0 in | Wt 214.0 lb

## 2012-07-03 DIAGNOSIS — M25561 Pain in right knee: Secondary | ICD-10-CM

## 2012-07-03 DIAGNOSIS — I10 Essential (primary) hypertension: Secondary | ICD-10-CM

## 2012-07-03 DIAGNOSIS — E119 Type 2 diabetes mellitus without complications: Secondary | ICD-10-CM

## 2012-07-03 DIAGNOSIS — M79606 Pain in leg, unspecified: Secondary | ICD-10-CM

## 2012-07-03 DIAGNOSIS — M25569 Pain in unspecified knee: Secondary | ICD-10-CM

## 2012-07-03 DIAGNOSIS — M79609 Pain in unspecified limb: Secondary | ICD-10-CM

## 2012-07-03 MED ORDER — HYDROCODONE-ACETAMINOPHEN 5-500 MG PO TABS: 1.0000 | ORAL_TABLET | Freq: Four times a day (QID) | ORAL | Status: DC | PRN

## 2012-07-03 NOTE — Telephone Encounter (Signed)
Noted  

## 2012-07-03 NOTE — Progress Notes (Signed)
  Subjective:    Patient ID: Michael [name], male    DOB: 1946/03/14, 66 y.o.   MRN: [id number]  HPI Patient here to follow chronic medical problems. He's been seen by orthopedics for his joint pain is to have physical therapy. He request a handicap sticker temporary he is also using a cane at this time. Blood sugars have been low 100s. His last A1c was 7.2% He took his blood pressure medication just prior to visit   Review of Systems  GEN- denies fatigue, fever, weight loss,weakness, recent illness HEENT- denies eye drainage, change in vision, nasal discharge, CVS- denies chest pain, palpitations RESP- denies SOB, cough, wheeze ABD- denies N/V, change in stools, abd pain GU- denies dysuria, hematuria, dribbling, incontinence MSK- + joint pain, muscle aches, injury Neuro- denies headache, dizziness, syncope, seizure activity      Objective:   Physical Exam GEN- NAD, alert and oriented x3, obese,walks with four point cane  HEENT- PERRL, EOMI, non injected sclera, pink conjunctiva, MMM, oropharynx clear Neck- Supple, no thryomegaly CVS- RRR, no murmur RESP-CTAB EXT- trace edema, varicose veins Pulses- Radial, DP- 2+        Assessment & Plan:

## 2012-07-03 NOTE — Assessment & Plan Note (Signed)
BP suboptimal today, recently took meds, trend, no change

## 2012-07-03 NOTE — Assessment & Plan Note (Signed)
Patellar tendinitis, s/p injections, given temporary handicap form

## 2012-07-03 NOTE — Assessment & Plan Note (Signed)
A1C 7.2%, doing well no change to meds on ACEI He plans to schedule podiatry exam

## 2012-07-03 NOTE — Assessment & Plan Note (Signed)
ABI to be scheduled, + risk factors for PAD

## 2012-07-03 NOTE — Assessment & Plan Note (Signed)
Weight gain 3lbs noted, discussed diet, he is limited in activity currently with his knees and is in PT

## 2012-07-03 NOTE — Patient Instructions (Signed)
Call Dr. Berline Lopes for your foot exam  Pain meds refilled Handi-cap form  F/U 3 months

## 2012-07-13 ENCOUNTER — Other Ambulatory Visit: Payer: Self-pay | Admitting: Family Medicine

## 2012-07-13 DIAGNOSIS — E119 Type 2 diabetes mellitus without complications: Secondary | ICD-10-CM

## 2012-07-13 DIAGNOSIS — M79606 Pain in leg, unspecified: Secondary | ICD-10-CM

## 2012-07-14 ENCOUNTER — Ambulatory Visit (HOSPITAL_COMMUNITY): Payer: Medicare Other

## 2012-07-14 ENCOUNTER — Ambulatory Visit (HOSPITAL_COMMUNITY)
Admission: RE | Admit: 2012-07-14 | Discharge: 2012-07-14 | Disposition: A | Discharge status: Home / Self Care | Payer: Medicare Other | Source: Ambulatory Visit | Attending: Orthopedic Surgery | Admitting: Orthopedic Surgery

## 2012-07-14 ENCOUNTER — Ambulatory Visit (HOSPITAL_COMMUNITY)
Admission: RE | Admit: 2012-07-14 | Discharge: 2012-07-14 | Disposition: A | Discharge status: Home / Self Care | Payer: Medicare Other | Source: Ambulatory Visit | Attending: Family Medicine | Admitting: Family Medicine

## 2012-07-14 DIAGNOSIS — E785 Hyperlipidemia, unspecified: Secondary | ICD-10-CM | POA: Insufficient documentation

## 2012-07-14 DIAGNOSIS — M6281 Muscle weakness (generalized): Secondary | ICD-10-CM | POA: Insufficient documentation

## 2012-07-14 DIAGNOSIS — E119 Type 2 diabetes mellitus without complications: Secondary | ICD-10-CM | POA: Insufficient documentation

## 2012-07-14 DIAGNOSIS — M79609 Pain in unspecified limb: Secondary | ICD-10-CM | POA: Insufficient documentation

## 2012-07-14 DIAGNOSIS — M7650 Patellar tendinitis, unspecified knee: Secondary | ICD-10-CM | POA: Diagnosis present

## 2012-07-14 DIAGNOSIS — M25469 Effusion, unspecified knee: Secondary | ICD-10-CM | POA: Insufficient documentation

## 2012-07-14 DIAGNOSIS — I10 Essential (primary) hypertension: Secondary | ICD-10-CM | POA: Insufficient documentation

## 2012-07-14 DIAGNOSIS — R262 Difficulty in walking, not elsewhere classified: Secondary | ICD-10-CM | POA: Insufficient documentation

## 2012-07-14 DIAGNOSIS — IMO0001 Reserved for inherently not codable concepts without codable children: Secondary | ICD-10-CM | POA: Insufficient documentation

## 2012-07-14 DIAGNOSIS — M79606 Pain in leg, unspecified: Secondary | ICD-10-CM

## 2012-07-14 DIAGNOSIS — M25569 Pain in unspecified knee: Secondary | ICD-10-CM | POA: Insufficient documentation

## 2012-07-14 DIAGNOSIS — N644 Mastodynia: Secondary | ICD-10-CM | POA: Insufficient documentation

## 2012-07-14 NOTE — Evaluation (Signed)
Physical Therapy Evaluation  Patient Details  Name: Michael [name] MRN: [id number] Date of Birth: [birth year]  Today's Date: 07/14/2012 Time: 1400-1440 PT Time Calculation (min): 40 min Charges: 1 eval, 1 ice, 8' TE Visit#: 1  of 8   Re-eval: 08/13/12 Assessment Diagnosis: B patellar tendonitis Next MD Visit: Dr. Aline Brochure - unscheduled Prior Therapy: [facility] and was D/C secondary to inability to pay co-pays  Authorization: [facility] medicare  Authorization Time Period:    Authorization Visit#: 1  of 10    Past Medical History:  Past Medical History  Diagnosis Date  . COPD (chronic obstructive pulmonary disease)   . Diabetes mellitus   . Hyperlipidemia   . Hypertension   . Anemia   . NASH (nonalcoholic steatohepatitis)    Past Surgical History:  Past Surgical History  Procedure Date  . Hernia repair   . Shoulder surgery     right  . Laparoscopy     stomach   Subjective Symptoms/Limitations Symptoms: see hx section for PMH Pertinent History: Pt is referred to PT secondary to L knee pain which started about a year ago.  He reports that he was given conservative treatment options, however none have helped, except ice.  He reports he had been riding his bike and stretching and trying to exercise for weight loss when his legs started to give him intense pain.  his c/co is the inability to walk for exercise in order to control weight for his diabetes.  He reports he attended [facility] visits [facility] and was unable to continue due to financial reasons.  He also states he will only be able to attend 2x/week due to same financial reasons.  How long can you stand comfortably?: With cane no problem.  Without cane always uncomfortable How long can you walk comfortably?: Unable to ambulate without cane.  Special Tests: B Knees: + pain with Valgus and Varus tests with hard end feels, - balatable test Patient Stated Goals: "I want to be able to control my weight and walk  again." Pain Assessment Currently in Pain?: Yes Pain Score:   8 (L>R, Pain range: 5-8/10. ) Pain Location: Knee Pain Orientation: Left;Right Pain Relieving Factors: laying in his recliner or straight chair.  Currently on Voltaren gel Effect of Pain on Daily Activities: unable to walk for exercise to control weight  Prior Functioning  Prior Function Vocation: Retired Comments: He enjoys walking for exercise and weight loss.  Going to church and visiting with freinds and family  Cognition/Observation Observation/Other Assessments Observations: notable popping to his L knee during SLS Other Assessments: abdominal hernia.   Sensation/Coordination/Flexibility/Functional Tests Functional Tests Functional Tests: LEFS: 29/80  Assessment RLE PROM (degrees) Right Knee Extension: 3  Right Knee Flexion: 80  (prone) RLE Strength Right Hip Flexion: 4/5 (w/trunk posterior lean) Right Hip Extension: 3-/5 Right Hip ABduction: 3/5 Right Knee Flexion: 4/5 (painful) Right Knee Extension: 4/5 (painful) LLE AROM (degrees) Left Knee Extension: 3  Left Knee Flexion: 90  (prone) LLE Strength Left Hip Flexion: 4/5 (w/trunk posterior lean) Left Hip Extension: 3-/5 Left Hip ABduction: 3/5 Left Knee Flexion: 3+/5 (painful) Left Knee Extension: 4/5 (painful) Palpation Palpation: significant pain and tenderness to B anterior knee joint lines (L>R) as well as to B quadricep musculature and ITB region (L>R)  Mobility/Balance  Ambulation/Gait Ambulation/Gait: Yes Assistive device: Small based quad cane (R hand) Static Standing Balance Single Leg Stance - Right Leg: 10  Single Leg Stance - Left  Leg: 10  Tandem Stance - Right Leg: 10  Tandem Stance - Left Leg: 10  Rhomberg - Eyes Opened: 10  Rhomberg - Eyes Closed: 10    Exercise/Treatments Stretches Hip Flexor Stretch: 1 rep;30 seconds (LLE only off side of bed) Supine Quad Sets: Both;5 reps Bridges: 10 reps;Both Straight Leg Raises:  Both;10 reps Other Supine Knee Exercises: Leg Roll (significant decrease in L hip IR) x10  Modalities Modalities: Cryotherapy Manual Therapy Manual Therapy: Joint mobilization Joint Mobilization: Patellar mobilizations (pt instructed on and educated to complete at home) Grade I-III to B fibular head AP and PA to decrease pain and improve mobility.  Cryotherapy Number Minutes Cryotherapy: 10 Minutes Cryotherapy Location: Knee (bilateral)  Physical Therapy Assessment and Plan PT Assessment and Plan Clinical Impression Statement: Pt is a 66 year old male referred to PT for B knee tendonitis.  After examination pt demonstrates significant pain to B quadriceps and ITB region which is reduced after manual techniques and ice. He demonstrates significant weakness to B LE with reports of increased muscular spasms which is likely due to overall deconditioning.  Pt will benefit from skilled therapeutic intervention in order to improve on the following deficits: Pain;Impaired perceived functional ability;Impaired flexibility;Decreased strength;Difficulty walking;Decreased range of motion Rehab Potential: Fair Clinical Impairments Affecting Rehab Potential: Pt only able to attend 2x week due to financial reasons PT Frequency: Min 2X/week (2-3x/week) PT Duration: 6 weeks PT Treatment/Interventions: Gait training;Functional mobility training;Stair training;Therapeutic activities;Therapeutic exercise;DME instruction;Balance training;Patient/family education;Neuromuscular re-education;Manual techniques;Modalities PT Plan: Add strengthening exercises as pt is able to tolerate (squats, heel/toe raises, ITB stretch, gastroc and soleus stretch, quad stretch), gait training.  Progress to dynamic balance training and decrease pain.  Encourage to continue with ice at home. Manual technqiues to decrease pain into his quads and ITB as needed.    Goals Home Exercise Program Pt will Perform Home Exercise Program:  Independently PT Goal: Perform Home Exercise Program - Progress: Goal set today PT Short Term Goals Time to Complete Short Term Goals: 2 weeks PT Short Term Goal 1: Pt will report greater ease when sitting in a pew at church.  PT Short Term Goal 2: Pt will improve LE strength by 1 muscle grade. PT Short Term Goal 3: Pt will improve prone knee flexion to 95 degrees to BLE. PT Long Term Goals Time to Complete Long Term Goals: 4 weeks PT Long Term Goal 1: Pt will improve LE strength to WNL in order report greater ease with standing and walking ability.  PT Long Term Goal 2: Pt wil improve LE balance in order to ambulate independently in a closed environment.  Long Term Goal 3: Pt will improve LEFS to 40/80 for improved percieved functional ability.  Problem List Patient Active Problem List  Diagnosis  . DIABETES MELLITUS, WITHOUT COMPLICATIONS  . HYPERLIPIDEMIA  . OBESITY, MORBID  . HYPERTENSION  . GERD  . ARTHRITIS  . NASH (nonalcoholic steatohepatitis)  . Knee pain, bilateral  . Leg pain  . Patellar tendonitis  . Leg weakness    PT - End of Session Activity Tolerance: Patient limited by pain  GP Functional Assessment Tool Used: LEFS Functional Limitation: Mobility: Walking and moving around Mobility: Walking and Moving Around Current Status ([id number]): At least 40 percent but less than 60 percent impaired, limited or restricted Mobility: Walking and Moving Around Goal Status [phone]): At least 20 percent but less than 40 percent impaired, limited or restricted  Ladelle Teodoro, PT 07/14/2012, 4:12 PM  Physician Documentation Your  signature is required to indicate approval of the treatment plan as stated above.  Please sign and either send electronically or make a copy of this report for your files and return this physician signed original.   Please mark one 1.__approve of plan  2. ___approve of plan with the following conditions.   ______________________________                                                           _____________________ Physician Signature                                                                                                             Date

## 2012-07-16 ENCOUNTER — Ambulatory Visit (HOSPITAL_COMMUNITY)
Admission: RE | Admit: 2012-07-16 | Discharge: 2012-07-16 | Disposition: A | Discharge status: Home / Self Care | Payer: Medicare Other | Source: Ambulatory Visit | Attending: Orthopedic Surgery | Admitting: Orthopedic Surgery

## 2012-07-16 NOTE — Progress Notes (Addendum)
Physical Therapy Treatment Patient Details  Name: Michael [name] MRN: [id number] Date of Birth: 1946-08-21  Today's Date: 07/16/2012 Time: 0935-1021 PT Time Calculation (min): 46 min Charges: 1 e-stim, 1 ice, 10' TE, 20' manual  Visit#: 2  of 8   Re-eval: 08/13/12   Authorization: [employer] medicare  Authorization Time Period:    Authorization Visit#: 2  of 10    Subjective: Symptoms/Limitations Symptoms: Pt reports that he is still hurting.  I have already done my exercises this morning and may be why its a little sore.  Pain Assessment Currently in Pain?: Yes Pain Score:   6 Pain Location: Knee Pain Orientation: Left;Right  Exercise/Treatments Stretches Hip Flexor Stretch: 3 reps;30 seconds (BLE) Knee: Self-Stretch to increase Flexion: Limitations Knee: Self-Stretch Limitations: ITB stretch 3x30 sec BLE Gastroc Stretch: 3 reps;30 seconds (BLE)  Modalities Modalities: Cryotherapy;Electrical Stimulation Manual Therapy Manual Therapy: Joint mobilization Joint Mobilization: Patellar mobilizations (pt instructed on and educated to complete at home) Grade I-III to B fibular head AP and PA to decrease pain and improve mobility w/STM after to L quadriceps region.   Cryotherapy Number Minutes Cryotherapy: 15 Minutes Cryotherapy Location: Knee (bilateral) Electrical Stimulation Electrical Stimulation Location: to L anterior thigh w/pt in supine with legs elevated  Electrical Stimulation Action: IFES hi/low sweep  Electrical Stimulation Goals: Pain  Physical Therapy Assessment and Plan PT Assessment and Plan Clinical Impression Statement: Pt is limited by pain to his quadriceps region with only minimal fascial restrictions, but continues to have significant pain with PROM L knee flexion in supine position.   Discussed with him talking to his MD about medications and educated on Statin toxicity.  Added stretching exercises to improve flexibility and decrease pain. Pt reports pain 2/10  after e-stim and ice.  PT Frequency:  (2-3x/week) PT Plan: Add strengthening exercises as pt is able to tolerate (squats, heel/toe raises, ITB stretch, gastroc and soleus stretch, quad stretch), gait training.  Progress to dynamic balance training and decrease pain.  Encourage to continue with ice at home. Manual technqiues to decrease pain into his quads and ITB as needed.    Goals    Problem List Patient Active Problem List  Diagnosis  . DIABETES MELLITUS, WITHOUT COMPLICATIONS  . HYPERLIPIDEMIA  . OBESITY, MORBID  . HYPERTENSION  . GERD  . ARTHRITIS  . NASH (nonalcoholic steatohepatitis)  . Knee pain, bilateral  . Leg pain  . Patellar tendonitis  . Leg weakness    PT - End of Session Activity Tolerance: Patient limited by pain  GP  Mervil Wacker, PT 07/16/2012, 10:15 AM

## 2012-07-21 ENCOUNTER — Ambulatory Visit (HOSPITAL_COMMUNITY)
Admission: RE | Admit: 2012-07-21 | Discharge: 2012-07-21 | Disposition: A | Discharge status: Home / Self Care | Payer: Medicare Other | Source: Ambulatory Visit | Attending: Orthopedic Surgery | Admitting: Orthopedic Surgery

## 2012-07-21 NOTE — Progress Notes (Signed)
Physical Therapy Treatment Patient Details  Name: Michael [name] MRN: [id number] Date of Birth: 1946-05-23  Today's Date: 07/21/2012 Time: 2836-6294 PT Time Calculation (min): 48 min Charges: 17' Manual, 8' NMR, 8' TE, 1 estim, 1 ice/heat Visit#: 3  of 8   Re-eval: 08/13/12   Authorization: [employer] medicare  Authorization Time Period:    Authorization Visit#: 3  of 10    Subjective: Symptoms/Limitations Symptoms: Pt continues to report increased pain to anterior thigh.  He denies speaking with doctor about medications.    Exercise/Treatments Seated Other Seated Knee Exercises: Heel Roll outs 7x10 sec holds Supine Bridges: 10 reps Sidelying Hip ADduction: Left;10 reps Clams: manual facilitation to for gluteus medius activation.  4x10sec holds after NMR.   Modalities Modalities: Electrical Stimulation;Moist Heat;Cryotherapy Manual Therapy Manual Therapy: Other (comment) Other Manual Therapy: AROM to L hip IR: 5, R hip IR: 32.  L S/L R pelvic mobilizations w/R hip flexor stretch following.  PRONE: Grade II-III L hip IR mobs to improve mobility, w/quad and hip flexor stretch to follow.  Moist Heat Therapy Number Minutes Moist Heat: 10 Minutes (initally w/estim - pt reports increased discomfort) Cryotherapy Number Minutes Cryotherapy: 5 Minutes (thigh for last 5 minutes of e-stim) Cryotherapy Location: Other (comment) (thigh) Type of Cryotherapy: Ice pack Gaffer Location: to L anterior thigh w/pt in supine with legs elevated  Electrical Stimulation Action: IFES hi/low sweep  Electrical Stimulation Goals: Pain  Physical Therapy Assessment and Plan PT Assessment and Plan Clinical Impression Statement: Pt is significantly limited by R hip IR motion due to restricitions and decreased strenght.  Treatment focused on manually moving hip into appropriate IR position and added exercises to promote hip strengthening.   PT Frequency:   (2-3x/week) PT Plan: Continue to address hip ROM deficits and improve hip strength.  Progress to core strengthening w/LE movemements.     Goals Home Exercise Program Pt will Perform Home Exercise Program: Independently PT Goal: Perform Home Exercise Program - Progress: Progressing toward goal PT Short Term Goals Time to Complete Short Term Goals: 2 weeks PT Short Term Goal 1: Pt will report greater ease when sitting in a pew at church.  PT Short Term Goal 1 - Progress: Progressing toward goal PT Short Term Goal 2: Pt will improve LE strength by 1 muscle grade. PT Short Term Goal 2 - Progress: Progressing toward goal PT Short Term Goal 3: Pt will improve prone knee flexion to 95 degrees to BLE. PT Short Term Goal 3 - Progress: Progressing toward goal PT Long Term Goals Time to Complete Long Term Goals: 4 weeks PT Long Term Goal 1: Pt will improve LE strength to WNL in order report greater ease with standing and walking ability.  PT Long Term Goal 1 - Progress: Progressing toward goal PT Long Term Goal 2: Pt wil improve LE balance in order to ambulate independently in a closed environment.  PT Long Term Goal 2 - Progress: Progressing toward goal Long Term Goal 3: Pt will improve LEFS to 40/80 for improved percieved functional ability. Long Term Goal 3 Progress: Progressing toward goal  Problem List Patient Active Problem List  Diagnosis  . DIABETES MELLITUS, WITHOUT COMPLICATIONS  . HYPERLIPIDEMIA  . OBESITY, MORBID  . HYPERTENSION  . GERD  . ARTHRITIS  . NASH (nonalcoholic steatohepatitis)  . Knee pain, bilateral  . Leg pain  . Patellar tendonitis  . Leg weakness    PT - End of Session Activity Tolerance: Patient limited by  pain  Taneil Lazarus, PT 07/21/2012, 9:58 AM

## 2012-07-23 ENCOUNTER — Ambulatory Visit (HOSPITAL_COMMUNITY)
Admission: RE | Admit: 2012-07-23 | Discharge: 2012-07-23 | Disposition: A | Discharge status: Home / Self Care | Payer: Medicare Other | Source: Ambulatory Visit | Attending: Orthopedic Surgery | Admitting: Orthopedic Surgery

## 2012-07-23 NOTE — Progress Notes (Signed)
Physical Therapy Treatment Patient Details  Name: Michael [name] MRN: [id number] Date of Birth: [birth year]  Today's Date: 07/23/2012 Time: 0806-0856 PT Time Calculation (min): 50 min  Visit#: 4  of 8   Re-eval: 08/13/12 Diagnosis: B patellar tendonitis Next MD Visit: Dr. Aline Brochure -January Prior Therapy: [facility] and was D/C secondary to inability to pay co-pays Authorization: [place] Visit#: 4  of 10   Charges:  therex 8', manual 24', [place] 8'  Subjective: Symptoms/Limitations Symptoms: Pt. states he's not seeing much of an improvement.  States he believes it's all in his knee and something is wrong; states he plans on returning to the MD.  Currently pain 6/10 in L knee. Pain Assessment Currently in Pain?: Yes Pain Score:   6 Pain Location: Knee Pain Orientation: Left   Exercise/Treatments Stretches Hip Flexor Stretch: 3 reps;30 seconds Knee: Self-Stretch to increase Flexion: Limitations Knee: Self-Stretch Limitations: ITB stretch 3x30 sec BLE Gastroc Stretch: 3 reps;30 seconds;Limitations Gastroc Stretch Limitations: B LE's Seated Other Seated Knee Exercises: Heel Roll outs 10x10 sec holds Supine Bridges: 10 reps Straight Leg Raises: Both;10 reps   Modalities Modalities: Ultrasound Manual Therapy Manual Therapy: Myofascial release Myofascial Release: to lateral thigh, gastroc and medial/lateral patellar region following [place] Cryotherapy Cryotherapy Location: Other (comment) (thigh) Ultrasound Ultrasound Location: Medial and lateral patella L Ultrasound Parameters: 1.4 w/cm2 continuous with 3 MHz, 4' on each side Ultrasound Goals: Pain  Physical Therapy Assessment and Plan PT Assessment and Plan Clinical Impression Statement: Overall improvement noted in L thigh with less pain, soreness to touch.  Pt. with difficulty relaxing mm for stretch; grasps L knee in pain frequently with all L LE movement, esp. transitioning from  flexion<->extension.  Focused on pain relief today.  Added [place] to  patellar tendon and MFR techniques to lateral thigh, gastroc and around L knee.  Most tightness found in central gastroc area and distal thigh near knee.  Pt. able to move knee without c/o pain following treatment.  Reported pain reduction of 3 levels to 3/10 at end of session. PT Frequency:  (2-3x/week) PT Plan: Focus on relieving L knee pain while improving LE strength and ROM.  Assess how well [place] helped to relieve pain next visit.      Problem List Patient Active Problem List  Diagnosis  . DIABETES MELLITUS, WITHOUT COMPLICATIONS  . HYPERLIPIDEMIA  . OBESITY, MORBID  . HYPERTENSION  . GERD  . ARTHRITIS  . NASH (nonalcoholic steatohepatitis)  . Knee pain, bilateral  . Leg pain  . Patellar tendonitis  . Leg weakness    PT - End of Session Activity Tolerance: Patient tolerated treatment well General Behavior During Session: [facility] for tasks performed Cognition: [facility] for tasks performed   Teena Irani, PTA/CLT 07/23/2012, 9:14 AM

## 2012-07-27 ENCOUNTER — Ambulatory Visit (INDEPENDENT_AMBULATORY_CARE_PROVIDER_SITE_OTHER): Payer: Medicare Other | Admitting: Family Medicine

## 2012-07-27 ENCOUNTER — Encounter: Payer: Self-pay | Admitting: Family Medicine

## 2012-07-27 VITALS — BP 120/60 | HR 100 | Resp 16 | Ht 63.0 in | Wt 210.4 lb

## 2012-07-27 DIAGNOSIS — M25561 Pain in right knee: Secondary | ICD-10-CM

## 2012-07-27 DIAGNOSIS — I10 Essential (primary) hypertension: Secondary | ICD-10-CM

## 2012-07-27 DIAGNOSIS — M79609 Pain in unspecified limb: Secondary | ICD-10-CM

## 2012-07-27 DIAGNOSIS — M79606 Pain in leg, unspecified: Secondary | ICD-10-CM

## 2012-07-27 DIAGNOSIS — M25569 Pain in unspecified knee: Secondary | ICD-10-CM

## 2012-07-27 DIAGNOSIS — M549 Dorsalgia, unspecified: Secondary | ICD-10-CM

## 2012-07-27 MED ORDER — HYDROCODONE-ACETAMINOPHEN 5-500 MG PO TABS: 1.0000 | ORAL_TABLET | Freq: Four times a day (QID) | ORAL | Status: DC | PRN

## 2012-07-27 MED ORDER — CYCLOBENZAPRINE HCL 10 MG PO TABS: 10.0000 mg | ORAL_TABLET | Freq: Three times a day (TID) | ORAL | Status: DC | PRN

## 2012-07-27 MED ORDER — TRAMADOL HCL 50 MG PO TABS: 50.0000 mg | ORAL_TABLET | Freq: Three times a day (TID) | ORAL | Status: DC

## 2012-07-27 NOTE — Assessment & Plan Note (Signed)
His ABIs looked good with some mild changes from his diabetes in hypertension. I will give him a trial off of his statin drug to see if this affects his leg pains and aches.

## 2012-07-27 NOTE — Progress Notes (Signed)
  Subjective:    Patient ID: Michael [name], male    DOB: Apr 13, 1946, 66 y.o.   MRN: [id number]  HPI Patient here to followup knee pain and back pain. He states that he is been going to physical therapy for approximately 6 weeks and is left knee is no better. He was seen by orthopedic surgery set up physical therapy. He is using full tear in jail as well as alternating tramadol and Vicodin. He continues to use a cane to ambulate. He states he was talking with a physical therapist and was told that his medications may be causing his knee pain. He states he also told that his hernia was causing his knee pain and that he is a grinding from his knee back and he felt in his back. Since his last therapy (Thurs)  appointment he's had a pulling in his Right lower back and side. He denies any change in bowel or bladder. He continues to get muscle cramps and leg pains    Review of Systems  GEN- denies fatigue, fever, weight loss,weakness, recent illness HEENT- denies eye drainage, change in vision, nasal discharge, CVS- denies chest pain, palpitations RESP- denies SOB, cough, wheeze ABD- denies N/V, change in stools, abd pain GU- denies dysuria, hematuria, dribbling, incontinence MSK- + joint pain, muscle aches, injury Neuro- denies headache, dizziness, syncope, seizure activity      Objective:   Physical Exam GEN- NAD, alert and oriented x3, obese,walks with four point cane  HEENT- PERRL, EOMI, non injected sclera, pink conjunctiva, MMM, oropharynx clear CVS- RRR, no murmur RESP-CTAB ABS-NABS,soft,NT,ND, small ventral hernia Back- Spine NT, neg SLR, TTP Right lateral aspect of back, + spasm noted, pain with lateral rotation, flexion, extension, decreased ROM Left knee- no effusion, decreased ROM  EXT- trace edema, varicose veins Pulses- Radial 2+      Assessment & Plan:

## 2012-07-27 NOTE — Assessment & Plan Note (Signed)
Bilateral knee pain with left greater than right. I will have him followup with orthopedic surgery to see if MRI is needed. I've refilled his Vicodin and his tramadol. I discussed that I not think that his blood pressure pills for his diabetes medication is contributing to his knee pain.

## 2012-07-27 NOTE — Assessment & Plan Note (Signed)
Acute back pain after exercising/PT. Muscle relaxant continue other meds, heating pad, stretch

## 2012-07-27 NOTE — Assessment & Plan Note (Signed)
Blood pressure is much improved today. At this point I would not stop any of his antihypertensive. It appears he's had very difficult to control blood pressure in the past and is currently on 4 different agents. I will need to trend his blood pressure over time as he is a new patient to me

## 2012-07-27 NOTE — Patient Instructions (Signed)
Try the muscle relaxant at bedtime for your back Stop the statin ( Zocor) which is the cholesterol medication Continue all other medications  Keep previous f/u appointment

## 2012-07-28 ENCOUNTER — Ambulatory Visit (HOSPITAL_COMMUNITY)
Admission: RE | Admit: 2012-07-28 | Discharge: 2012-07-28 | Disposition: A | Discharge status: Home / Self Care | Payer: Medicare Other | Source: Ambulatory Visit | Attending: Orthopedic Surgery | Admitting: Orthopedic Surgery

## 2012-07-28 NOTE — Progress Notes (Signed)
Physical Therapy Treatment Patient Details  Name: Michael [name] MRN: [id number] Date of Birth: [birth year]  Today's Date: 07/28/2012 Time: 6789-3810 PT Time Calculation (min): 49 min  Visit#: 5  of 8   Re-eval: 08/13/12  Charge: manual 10', [place] 8', self care 10', therex 20'  Authorization: [place] [employer]  Authorization Time Period:    Authorization Visit#: 5  of 10    Subjective: Symptoms/Limitations Symptoms: Pt reported good relief with [place] last session.  Pt stated he strained back muscle last session getting off mat, js on muscle relaxors now for LBP.  Pain is currently 6/10 L knee. Pain Assessment Currently in Pain?: Yes Pain Score:   6 Pain Location: Knee Pain Orientation: Left  Objective:   Exercise/Treatments Stretches Hip Flexor Stretch: 3 reps;30 seconds ITB Stretch: 2 reps;30 seconds Gastroc Stretch: 3 reps;30 seconds;Limitations Gastroc Stretch Limitations: B LE's Supine Bridges: 5 reps;Limitations Bridges Limitations: limited by LBP Sidelying Hip ADduction: Left;10 reps Clams: manual facilitation to for gluteus medius activation max vc-ing to relax hamstring with activity.  4x10sec holds after NMR.  Other Sidelying Knee Exercises: instructed proper technique for sit to s/l to roll only back Other Sidelying Knee Exercises: rolling with R UE and LE leading with cueing to reduce compensation  Modalities Modalities: Ultrasound Manual Therapy Joint Mobilization: Patellar mobilizations (pt instructed on and educated to complete at home) Grade I-III to B fibular head AP and PA to decrease pain and improve mobility w/STM after to L quadriceps region Myofascial Release: to lateral thigh, gastroc and medial/lateral patellar region following [place]  Ultrasound Ultrasound Location: Medial and lateral patella L knee Ultrasound Parameters: 1.4 w/cm2 continuous with 3 MHz, 4' on each side  Ultrasound Goals: Pain  Physical Therapy Assessment and Plan PT Assessment and  Plan Clinical Impression Statement: Reviewed proper technique getting in and out of bed to reduce risk of further injury.  Added rolling to POC to improve functional core and LE stability with multimodal cueing to reduce compensation with new activity.  Pt with difficulty transitioning knee from flexion to extension, limited by pain.  Pain reduced at end of session following [place] and manual MFR techniques to lateral thigh, gastroc and around L knee.  Most tightness found in central gastroc area and distal thigh near knee. PT Plan: Focus on relieving L knee pain while improving LE strength and ROM.  Continue with [place] and manual techniques to relieve pain    Goals    Problem List Patient Active Problem List  Diagnosis  . DIABETES MELLITUS, WITHOUT COMPLICATIONS  . HYPERLIPIDEMIA  . OBESITY, MORBID  . HYPERTENSION  . GERD  . ARTHRITIS  . NASH (nonalcoholic steatohepatitis)  . Knee pain, bilateral  . Leg pain  . Patellar tendonitis  . Leg weakness  . Back pain    PT - End of Session Activity Tolerance: Patient tolerated treatment well;Patient limited by pain General Behavior During Session: [facility] for tasks performed Cognition: [facility] for tasks performed  GP    Aldona Lento 07/28/2012, 11:33 AM

## 2012-07-30 ENCOUNTER — Ambulatory Visit (HOSPITAL_COMMUNITY)
Admission: RE | Admit: 2012-07-30 | Discharge: 2012-07-30 | Disposition: A | Discharge status: Home / Self Care | Payer: Medicare Other | Source: Ambulatory Visit | Attending: Orthopedic Surgery | Admitting: Orthopedic Surgery

## 2012-07-30 NOTE — Progress Notes (Signed)
Physical Therapy Treatment Patient Details  Name: Michael [name] MRN: [id number] Date of Birth: March 12, 1946  Today's Date: 07/30/2012 Time: 0301-3143 PT Time Calculation (min): 48 min Charges: 8' [place], 30' TE, 1 ice Visit#: 6  of 8   Re-eval: 08/13/12    Authorization: [facility] Medicare  Authorization Time Period:    Authorization Visit#: 6  of 10    Subjective: Symptoms/Limitations Symptoms: Pt reports that he has been off his statin medication for about a day and is not having as much pain in his quadricep.  He has most of his pain to his patellar tendon this morning. he reports that he is moving around better Pain Assessment Currently in Pain?: Yes Pain Score:   5 Pain Location: Knee Pain Orientation: Left  Precautions/Restrictions     Exercise/Treatments Stretches Passive Hamstring Stretch: 3 reps;30 seconds Gastroc Stretch: 3 reps;30 seconds;Limitations Gastroc Stretch Limitations: B LE on slant board Standing Rocker Board: 1 minute;2 minutes;Limitations Rocker Board Limitations: 1' AP, 2' S<>S Other Standing Knee Exercises: Hip ABD and Extension w/manual faciliation x10 BLE Seated Other Seated Knee Exercises: Heel Roll outs 10x10 sec holds w/soccer ball Supine Short Arc Quad Sets: Left;20 reps Straight Leg Raises: Left;20 reps Sidelying Hip ABduction: Left;15 reps  Modalities Modalities: Ultrasound Cryotherapy Number Minutes Cryotherapy: 10 Minutes Cryotherapy Location: Knee Type of Cryotherapy: Ice pack (end of session) Ultrasound Ultrasound Location: L Patellar Tendon  Ultrasound Parameters: 3 Mhz, 0.8 w/cm2 x8 minutes @ beginning of session.   Ultrasound Goals: Pain  Physical Therapy Assessment and Plan PT Assessment and Plan Clinical Impression Statement: Pt continues to have greatest limitation with overall decreased core, hip and L LE strength.  Requires max manual facilitation to improve L foot mechanics with standing activities.   PT Plan: Continue  with non-thermal [place] at beginning of tx to decrease inflammation, continue with LE strengthening with proper L foot mechanics (encourage neutral foot) and improve LE strength.  Add squats and hee and toe raises if able.     Goals    Problem List Patient Active Problem List  Diagnosis  . DIABETES MELLITUS, WITHOUT COMPLICATIONS  . HYPERLIPIDEMIA  . OBESITY, MORBID  . HYPERTENSION  . GERD  . ARTHRITIS  . NASH (nonalcoholic steatohepatitis)  . Knee pain, bilateral  . Leg pain  . Patellar tendonitis  . Leg weakness  . Back pain    PT - End of Session Activity Tolerance: Patient tolerated treatment well;Patient limited by pain General Behavior During Session: [facility] for tasks performed Cognition: [facility] for tasks performed  GP    Michael [name] 07/30/2012, 8:45 AM

## 2012-08-04 ENCOUNTER — Ambulatory Visit (HOSPITAL_COMMUNITY)
Admission: RE | Admit: 2012-08-04 | Discharge: 2012-08-04 | Disposition: A | Discharge status: Home / Self Care | Payer: Medicare Other | Source: Ambulatory Visit | Attending: Orthopedic Surgery | Admitting: Orthopedic Surgery

## 2012-08-04 NOTE — Progress Notes (Addendum)
Physical Therapy Treatment Patient Details  Name: Michael [name] MRN: [id number] Date of Birth: [birth year]  Today's Date: 08/04/2012 Time: 0801-0848 PT Time Calculation (min): 47 min  Visit#: 7  of 8   Re-eval: 08/13/12  Charge: therex 35'  [place] 8'  Authorization: [facility] Medicare  Authorization Time Period:    Authorization Visit#: 7  of 10    Subjective: Symptoms/Limitations Symptoms: Pt reported he is sore this morning following his exercises at home.  Pain scale 3/10 Pain Assessment Currently in Pain?: Yes Pain Score:   3  Objective:   Exercise/Treatments Stretches Passive Hamstring Stretch: 3 reps;30 seconds Gastroc Stretch: 3 reps;30 seconds;Limitations Gastroc Stretch Limitations: B LE on slant board Standing Heel Raises: 15 reps;Limitations Heel Raises Limitations: toe raises 15 reps Functional Squat: 10 reps Rocker Board: 2 minutes Rocker Board Limitations: A/P and R/L x 2' Other Standing Knee Exercises: Hip ABD and Extension w/manual faciliation x15 BLE Seated Other Seated Knee Exercises: Heel Roll outs 10x10 sec holds w/ orange ball Sidelying Hip ABduction: Left;15 reps Clams: manual facilitation to for gluteus medius activation max vc-ing to relax hamstring with activity.  10x10sec holds after NMR.   Modalities Modalities: Ultrasound Ultrasound Ultrasound Location: L patellar tendon Ultrasound Parameters: 3MHZ 0.8 w/cm2 pulsed x 8 minutes  Physical Therapy Assessment and Plan PT Assessment and Plan Clinical Impression Statement: Added heel/toe raises and functional squats for LE strengthening.  Pt required max manual faciliatiton to improve L foot mechanics with standing activities. PT Plan: Reassess next session, gcode.  Continue with non-thermal [place] at beginning of tx to decrease inflammation, continue with LE strengthening with proper L foot mechanics (encourage neutral foot) and improve LE strength    Goals    Problem List Patient Active Problem List    Diagnosis  . DIABETES MELLITUS, WITHOUT COMPLICATIONS  . HYPERLIPIDEMIA  . OBESITY, MORBID  . HYPERTENSION  . GERD  . ARTHRITIS  . NASH (nonalcoholic steatohepatitis)  . Knee pain, bilateral  . Leg pain  . Patellar tendonitis  . Leg weakness  . Back pain    PT - End of Session Activity Tolerance: Patient tolerated treatment well General Behavior During Session: [facility] for tasks performed Cognition: [facility] for tasks performed  GP    Aldona Lento 08/04/2012, 9:01 AM

## 2012-08-06 ENCOUNTER — Ambulatory Visit (HOSPITAL_COMMUNITY)
Admission: RE | Admit: 2012-08-06 | Discharge: 2012-08-06 | Disposition: A | Discharge status: Home / Self Care | Payer: Medicare Other | Source: Ambulatory Visit | Attending: Orthopedic Surgery | Admitting: Orthopedic Surgery

## 2012-08-06 DIAGNOSIS — M6281 Muscle weakness (generalized): Secondary | ICD-10-CM | POA: Insufficient documentation

## 2012-08-06 DIAGNOSIS — M25469 Effusion, unspecified knee: Secondary | ICD-10-CM | POA: Insufficient documentation

## 2012-08-06 DIAGNOSIS — IMO0001 Reserved for inherently not codable concepts without codable children: Secondary | ICD-10-CM | POA: Insufficient documentation

## 2012-08-06 DIAGNOSIS — I10 Essential (primary) hypertension: Secondary | ICD-10-CM | POA: Insufficient documentation

## 2012-08-06 DIAGNOSIS — R262 Difficulty in walking, not elsewhere classified: Secondary | ICD-10-CM | POA: Insufficient documentation

## 2012-08-06 DIAGNOSIS — E119 Type 2 diabetes mellitus without complications: Secondary | ICD-10-CM | POA: Insufficient documentation

## 2012-08-06 DIAGNOSIS — M25569 Pain in unspecified knee: Secondary | ICD-10-CM | POA: Insufficient documentation

## 2012-08-06 NOTE — Evaluation (Signed)
Physical Therapy Discharge/G-code  Patient Details  Name: Michael [name] MRN: [id number] Date of Birth: Feb 12, 1946  Today's Date: 08/06/2012 Time: 7106-2694 PT Time Calculation (min): 38 min Treatment/re-eval completed by Michael [name], PT Visit#: 8  of 8   Re-eval: 08/13/12  Authorization: [employer] Medicare  Authorization Visit#: 8  of 10   Charges:  MMT, ROM testing, PPT, therex 15'  Diagnosis: B patellar tendonitis Next MD Visit: Dr. Aline [name] - unscheduled Prior Therapy: [facility] and was D/C secondary to inability to pay co-pays  Subjective Symptoms/Limitations Symptoms: Pt reports that he had to get ready for a yard sale this weekend and climbed up a small ladder and is having increased pain overall. He reports before this weekend he was doing really well and thought that he  Pain Assessment Currently in Pain?: Yes Pain Score:   6 Pain Location: Knee Pain Orientation: Left   Sensation/Coordination/Flexibility/Functional Tests Functional Tests: LEFS: 34/80 (was 29/80)  Assessment LLE AROM (degrees) LLE Overall AROM Comments: Prone hip IR: 0 degress Left Knee Extension: 3 (was 3 degrees) Left Knee Flexion: 90 (was 90 degrees)  LLE Strength Left Hip Flexion: 3/5 (limited by pain to knee, was 4/5) Left Hip Extension: 3/5 (was 3-/5) Left Hip ABduction: 3+/5 (was 3/5) Left Knee Flexion: 3+/5 (was 3+/5) Left Knee Extension: 3/5 (too painful to take resistance; was 4/5)  Palpation Palpation: allodynia to medial knee and distal quadriceps region.  Pain with PROM.  - Valgus and Varus, - anteiror drawer with hard end feels. Unable to perform McMurray due to significant pain with rotation of knee.   Exercise/Treatments Mobility/Balance  Ambulation/Gait Ambulation/Gait: Yes Assistive device: Straight cane (was using QC)  Sidelying Hip ABduction: Left;10 reps Prone  Hamstring Curl: 10 reps Hip Extension: Left;10 reps  Physical Therapy Assessment and  Plan PT Assessment and Plan Clinical Impression Statement: Re-eval and G-code updated for D/C. Michael [name] has attended 8 OP PT visits to address patellar tendonitis (in this clinic, 2 previous visits from another clinic).  Pt initally had pain in thigh region, he has since d/c his statin medication and has decreased overall thigh pain, however continues to has allodynia to knee with active and passive ROM activities to the anterior and medial knee region. At this point pt has minimal gains with strength and continues to have significant limitations with L hip IR which may also be causing increased knee pain.  I remained concerend about his pain with PROM activities and strongly recommend pt to f/u w/MD. PT Plan: D/C w/HEP.    Goals Home Exercise Program Pt will Perform Home Exercise Program: Independently PT Goal: Perform Home Exercise Program - Progress: Progressing toward goal  PT Short Term Goals Time to Complete Short Term Goals: 2 weeks PT Short Term Goal 1: Pt will report greater ease when sitting in a pew at church.  PT Short Term Goal 1 - Progress: Met PT Short Term Goal 2: Pt will improve LE strength by 1 muscle grade. PT Short Term Goal 2 - Progress: Not met PT Short Term Goal 3: Pt will improve prone knee flexion to 95 degrees to BLE. PT Short Term Goal 3 - Progress: Progressing toward goal (90 degrees)  PT Long Term Goals Time to Complete Long Term Goals: 4 weeks PT Long Term Goal 1: Pt will improve LE strength to WNL in order report greater ease with standing and walking ability.  PT Long Term Goal 1 - Progress: Not met PT Long Term  Goal 2: Pt wil improve LE balance in order to ambulate independently in a closed environment.  PT Long Term Goal 2 - Progress: Not met (ambulating with SPC) Long Term Goal 3: Pt will improve LEFS to 40/80 for improved percieved functional ability. Long Term Goal 3 Progress: Not met (34/80)  Problem List Patient Active Problem List  Diagnosis   . DIABETES MELLITUS, WITHOUT COMPLICATIONS  . HYPERLIPIDEMIA  . OBESITY, MORBID  . HYPERTENSION  . GERD  . ARTHRITIS  . NASH (nonalcoholic steatohepatitis)  . Knee pain, bilateral  . Leg pain  . Patellar tendonitis  . Leg weakness  . Back pain    PT - End of Session Activity Tolerance: Patient tolerated treatment well General Behavior During Session: [facility] for tasks performed Cognition: [facility] for tasks performed PT Plan of Care PT Patient Instructions: Discussed concerning symptoms of pain w/PROM activities.  Suggested pt f/u with MD.  Michael [name] and Agree with Plan of Care: Patient  GP Functional Assessment Tool Used: LEFS: 34/80 Functional Limitation: Mobility: Walking and moving around Mobility: Walking and Moving Around Goal Status [phone]): At least 20 percent but less than 40 percent impaired, limited or restricted Mobility: Walking and Moving Around Discharge Status [phone]): At least 40 percent but less than 60 percent impaired, limited or restricted  Michael [name], PTA/CLT 08/06/2012, 8:50 AM

## 2012-08-10 ENCOUNTER — Telehealth: Payer: Self-pay | Admitting: Orthopedic Surgery

## 2012-08-10 NOTE — Telephone Encounter (Signed)
He is to come back in 3 months as scheduled (last appt 07/01/2012)  No other treatment advised

## 2012-08-10 NOTE — Telephone Encounter (Signed)
Michael [name] has been in therapy  for 6 weeks for bilateral knees.  He says the right knee is much better, but the left is actually a lot worse..Today the left knee is swollen and hard to walk on it.  Do you need to see him or order other test before he comes back? His phone # [phone]

## 2012-08-10 NOTE — Telephone Encounter (Signed)
Relayed doctor's reply to the patient

## 2012-08-11 ENCOUNTER — Ambulatory Visit (HOSPITAL_COMMUNITY): Payer: Medicare Other | Admitting: Physical Therapy

## 2012-08-13 ENCOUNTER — Ambulatory Visit (HOSPITAL_COMMUNITY): Payer: Medicare Other

## 2012-08-18 ENCOUNTER — Ambulatory Visit (HOSPITAL_COMMUNITY): Payer: Medicare Other

## 2012-08-20 ENCOUNTER — Ambulatory Visit (HOSPITAL_COMMUNITY): Payer: Medicare Other | Admitting: Physical Therapy

## 2012-09-01 ENCOUNTER — Other Ambulatory Visit: Payer: Self-pay | Admitting: Family Medicine

## 2012-10-02 ENCOUNTER — Ambulatory Visit (INDEPENDENT_AMBULATORY_CARE_PROVIDER_SITE_OTHER): Payer: Medicare Other | Admitting: Family Medicine

## 2012-10-02 ENCOUNTER — Encounter: Payer: Self-pay | Admitting: Family Medicine

## 2012-10-02 VITALS — BP 120/70 | HR 99 | Resp 16 | Ht 63.0 in | Wt 214.0 lb

## 2012-10-02 DIAGNOSIS — M79609 Pain in unspecified limb: Secondary | ICD-10-CM

## 2012-10-02 DIAGNOSIS — D649 Anemia, unspecified: Secondary | ICD-10-CM

## 2012-10-02 DIAGNOSIS — Z125 Encounter for screening for malignant neoplasm of prostate: Secondary | ICD-10-CM

## 2012-10-02 DIAGNOSIS — M25561 Pain in right knee: Secondary | ICD-10-CM

## 2012-10-02 DIAGNOSIS — M25569 Pain in unspecified knee: Secondary | ICD-10-CM

## 2012-10-02 DIAGNOSIS — M79606 Pain in leg, unspecified: Secondary | ICD-10-CM

## 2012-10-02 DIAGNOSIS — E119 Type 2 diabetes mellitus without complications: Secondary | ICD-10-CM

## 2012-10-02 DIAGNOSIS — E785 Hyperlipidemia, unspecified: Secondary | ICD-10-CM

## 2012-10-02 DIAGNOSIS — I10 Essential (primary) hypertension: Secondary | ICD-10-CM

## 2012-10-02 MED ORDER — HYDROCODONE-ACETAMINOPHEN 5-500 MG PO TABS: ORAL_TABLET | ORAL | Status: DC

## 2012-10-02 MED ORDER — OMEPRAZOLE 20 MG PO CPDR: 20.0000 mg | DELAYED_RELEASE_CAPSULE | Freq: Two times a day (BID) | ORAL | Status: DC

## 2012-10-02 MED ORDER — CLONIDINE HCL 0.1 MG PO TABS: 0.1000 mg | ORAL_TABLET | Freq: Two times a day (BID) | ORAL | Status: DC

## 2012-10-02 MED ORDER — INSULIN ASPART PROT & ASPART (70-30 MIX) 100 UNIT/ML ~~LOC~~ SUSP: 20.0000 [IU] | Freq: Two times a day (BID) | SUBCUTANEOUS | Status: DC

## 2012-10-02 MED ORDER — METFORMIN HCL 1000 MG PO TABS: 1000.0000 mg | ORAL_TABLET | Freq: Two times a day (BID) | ORAL | Status: DC

## 2012-10-02 NOTE — Assessment & Plan Note (Signed)
Followup orthopedics I have refilled his hydrocodone they may not be much she can do secondary to the tendinitis in some osteoarthritis

## 2012-10-02 NOTE — Assessment & Plan Note (Signed)
Per above

## 2012-10-02 NOTE — Assessment & Plan Note (Signed)
His LDL 75 he will can team you on Zocor there is no change with his leg pains

## 2012-10-02 NOTE — Patient Instructions (Addendum)
Continue current medications Take your blood sugars fasting each day and record Continue pain meds, follow up with orthopedics for your leg Referral to foot doctor Call your insurance about the eye doctor Labs 1 week before next visit F/U 3 months for Medicare physical

## 2012-10-02 NOTE — Assessment & Plan Note (Signed)
He has gained some weight he has limited mobility due to his leg pains however we stressed the importance of his diet

## 2012-10-02 NOTE — Assessment & Plan Note (Signed)
He is to restart testing blood sugars and discuss the importance of this and adherence to diet. No change the medications at this time last A1c 7%

## 2012-10-02 NOTE — Assessment & Plan Note (Signed)
Well controlled 

## 2012-10-02 NOTE — Progress Notes (Signed)
  Subjective:    Patient ID: Michael [name], male    DOB: 1946/06/28, 67 y.o.   MRN: [id number]  HPI  Patient presents to follow chronic medical problems. Medications reviewed.  Diabetes mellitus he's not been taking his blood sugars because he felt stressed with his leg it was a holiday time anemia he was overeating. She's gained 4 pounds. He still injecting NovoLog 70 3020 units twice a day and taking his metformin and glipizide. His last A1c was 7%.  Leg pain he continues to have pain with bilateral legs and knees. He has been seen by orthopedics. He did not do well with physical therapy but completed in 6 weeks as prescribed. He still using tramadol and Vicodin for pain. He requires the use of a cane. He is followup with orthopedics next week for possible MRI or further imaging. He did stop the statin drug for a few weeks but there was no change in his legs therefore restart his cholesterol medication.  He's not seen a podiatrist and is due to see the eye doctor.  Colonoscopy has had colonoscopy in 2011 with the [facility] after he had an upper GI bleed he states he also had a colonoscopy here in town a few years back  Review of Systems   GEN- denies fatigue, fever, weight loss,weakness, recent illness HEENT- denies eye drainage, change in vision, nasal discharge, CVS- denies chest pain, palpitations RESP- denies SOB, cough, wheeze ABD- denies N/V, change in stools, abd pain GU- denies dysuria, hematuria, dribbling, incontinence MSK- + joint pain, muscle aches, injury Neuro- denies headache, dizziness, syncope, seizure activity      Objective:   Physical Exam GEN- NAD, alert and oriented x3 HEENT- PERRL, EOMI, non injected sclera, pink conjunctiva, MMM, oropharynx clear Neck- Supple,  CVS- RRR, no murmur RESP-CTAB ABD-NABS,soft,NT,ND, ventral hernia  EXT- Trace right pedal edema Pulses- Radial, DP- 2+ See Diabetic Foot Exam        Assessment & Plan:

## 2012-10-07 ENCOUNTER — Encounter: Payer: Self-pay | Admitting: Orthopedic Surgery

## 2012-10-07 ENCOUNTER — Ambulatory Visit (INDEPENDENT_AMBULATORY_CARE_PROVIDER_SITE_OTHER): Payer: Medicare Other | Admitting: Orthopedic Surgery

## 2012-10-07 VITALS — BP 136/70 | Ht 63.0 in | Wt 211.0 lb

## 2012-10-07 DIAGNOSIS — IMO0002 Reserved for concepts with insufficient information to code with codable children: Secondary | ICD-10-CM

## 2012-10-07 DIAGNOSIS — S83209A Unspecified tear of unspecified meniscus, current injury, unspecified knee, initial encounter: Secondary | ICD-10-CM | POA: Insufficient documentation

## 2012-10-07 DIAGNOSIS — M1710 Unilateral primary osteoarthritis, unspecified knee: Secondary | ICD-10-CM | POA: Insufficient documentation

## 2012-10-07 NOTE — Progress Notes (Signed)
Patient ID: Michael [name], male   DOB: November 23, 1945, 67 y.o.   MRN: [id number] Chief Complaint  Patient presents with  . Follow-up    recheck sp left knee injection   Michael [name] presents back after treatment with physical therapy and injection for anterior knee pain. He presents now with diffuse knee pain. X-rays show osteoarthritis. He did not tolerate physical therapy. His knee is actually worse than it was before. He has diffuse knee pain, inability to walk, loss of range of motion a clicking sensation, and giving out of his LEFT knee.  Past Medical History  Diagnosis Date  . COPD (chronic obstructive pulmonary disease)   . Diabetes mellitus   . Hyperlipidemia   . Hypertension   . Anemia   . NASH (nonalcoholic steatohepatitis)   . Diabetic neuropathy     His review of systems reveals no new abnormalities. He says his breathing is normal, now. He's had no diabetic related complications. Her symptoms.  He says his blood pressures been good.  BP 136/70  Ht 5' 3"  (1.6 m)  Wt 211 lb (95.709 kg)  BMI 37.38 kg/m2  Physical Exam(12)  Vital signs:   GENERAL: normal development   CDV: pulses are normal   Skin: normal  Lymph: nodes were not palpable/normal  Psychiatric: awake, alert and oriented  Neuro: normal sensation  MSK  Gait: He is ambulatory with a cane, and a significant limp, favoring his LEFT leg 1 Inspection  LEFT knee shows he has a joint effusion with medial and lateral joint line tenderness 2 Range of Motion Is limited to, 90 of flexion, and a 10 loss of extension 3 Motor Grade 5 4 Stability Normal  + McMurray's test medial meniscal tear  Other side:  5 Inspection of the RIGHT knee shows that he has no effusion, but he does have some anterior knee pain, tenderness. His patellar tendon swelling in the retropatellar tendon fat pad. His range of motion is normal. He has a grade 5. Motor exam 6 Stability test were normal  Imaging Previous imaging shows a  medial joint space narrowing with osteophyte formation  Assessment: Our initial diagnosis of patellar tendinitis, but I think he now has either a torn meniscus or exacerbation of underlying osteoarthritis. He failed physical therapy, failed injection. He is on pain medication, which is not helping    Plan: MRI LEFT knee. If his meniscus is torn I would recommend he have a meniscectomy of his knee. He has significant arthritic changes that I recommend he have a knee replacement.  He is to discuss this with me after reading our material on knee replacement on his next visit

## 2012-10-07 NOTE — Patient Instructions (Addendum)
You have been scheduled for an MRI scan.  Your insurance company requires a precertification prior to scheduling the MRI.  If the MRI scan is not approved we will let you know and make further treatment recommendations according to your insurance's guidelines.  We will schedule you for another  appointment to review the results and make further treatment recommendations    Total Knee Replacement Total knee replacement is a procedure to replace your knee joint with an artificial knee joint (prosthetic knee joint). The purpose of this surgery is to reduce pain and improve your knee function. LET YOUR CAREGIVER KNOW ABOUT:    Any allergies you have.   Any medicines you are taking, including vitamins, herbs, eyedrops, over-the-counter medicines, and creams.   Any problems you have had with the use of anesthetics.   Family history of problems with the use of anesthetics.   Any blood disorders you have, including bleeding problems or clotting problems.   Previous surgeries you have had.  RISKS AND COMPLICATIONS   Generally, total knee replacement is a safe procedure. However, as with any surgical procedure, complications can occur. Possible complications associated with total knee replacement include:  Loss of range of motion of the knee or instability.   Loosening of the prosthesis.   Infection.   Persistent pain.  BEFORE THE PROCEDURE    Your caregiver will instruct you when you need to stop eating and drinking.   Ask your caregiver if you need to change or stop any regular medicines.  PROCEDURE   Just before the procedure you will receive medicine that will make you drowsy (sedative). This will be given through a tube that is inserted into one of your veins (intravenous [IV] tube). Then you will either receive medicine to block pain from the waist down through your legs (spinal block) or medicine to also receive medicine to make you fall asleep (general anesthetic). You may also  receive medicine to block feeling in your leg (nerve block) to help ease pain after surgery. An incision will be made in your knee. Your surgeon will take out any damaged cartilage and bone by sawing off the damaged surfaces. Then the surgeon will put a new metal liner over the sawed off portion of your thigh bone (femur) and a plastic liner over the sawed off portion of one of the bones of your lower leg (tibia). This is to restore alignment and function to your knee. A plastic piece is often used to restore the surface of your knee cap. AFTER THE PROCEDURE   You will be taken to the recovery area. You may have drainage tubes to drain excess fluid from your knee. These tubes attach to a device that removes these fluids. Once you are awake, stable, and taking fluids well, you will be taken to your hospital room. You will receive physical therapy as prescribed by your caregiver. The length of your stay in the hospital after a knee replacement is 2 4 days. Your surgeon may recommend that you spend time (usually an additional 10 14 days) in an extended-care facility to help you begin walking again and improve your range of motion before you go home. You may also be prescribed blood-thinning medicine to decrease your risk of developing blood clots in your leg. Document Released: 12/23/2000 Document Revised: 03/17/2012 Document Reviewed: 10/27/2011 [facility] Patient Information 2013 [place].

## 2012-10-12 ENCOUNTER — Telehealth: Payer: Self-pay | Admitting: Radiology

## 2012-10-12 NOTE — Telephone Encounter (Signed)
Patient has MRI at [facility] on 10-14-12 at 10:45. Patient has [facility], authorization # [id number] and it expires on 11-26-12. Patient will follow up back here in the office for his results.

## 2012-10-14 ENCOUNTER — Ambulatory Visit (HOSPITAL_COMMUNITY)
Admission: RE | Admit: 2012-10-14 | Discharge: 2012-10-14 | Disposition: A | Discharge status: Home / Self Care | Payer: Medicare Other | Source: Ambulatory Visit | Attending: Orthopedic Surgery | Admitting: Orthopedic Surgery

## 2012-10-14 DIAGNOSIS — S83209A Unspecified tear of unspecified meniscus, current injury, unspecified knee, initial encounter: Secondary | ICD-10-CM

## 2012-10-14 DIAGNOSIS — M25569 Pain in unspecified knee: Secondary | ICD-10-CM | POA: Insufficient documentation

## 2012-10-14 DIAGNOSIS — M23329 Other meniscus derangements, posterior horn of medial meniscus, unspecified knee: Secondary | ICD-10-CM | POA: Insufficient documentation

## 2012-10-18 ENCOUNTER — Other Ambulatory Visit: Payer: Self-pay | Admitting: Family Medicine

## 2012-10-26 ENCOUNTER — Encounter: Payer: Self-pay | Admitting: Orthopedic Surgery

## 2012-10-26 ENCOUNTER — Ambulatory Visit (INDEPENDENT_AMBULATORY_CARE_PROVIDER_SITE_OTHER): Payer: Medicare Other | Admitting: Orthopedic Surgery

## 2012-10-26 ENCOUNTER — Telehealth: Payer: Self-pay | Admitting: Orthopedic Surgery

## 2012-10-26 VITALS — BP 130/64 | Ht 63.0 in | Wt 211.0 lb

## 2012-10-26 DIAGNOSIS — S83209A Unspecified tear of unspecified meniscus, current injury, unspecified knee, initial encounter: Secondary | ICD-10-CM

## 2012-10-26 DIAGNOSIS — IMO0002 Reserved for concepts with insufficient information to code with codable children: Secondary | ICD-10-CM

## 2012-10-26 DIAGNOSIS — M1710 Unilateral primary osteoarthritis, unspecified knee: Secondary | ICD-10-CM

## 2012-10-26 DIAGNOSIS — M1712 Unilateral primary osteoarthritis, left knee: Secondary | ICD-10-CM

## 2012-10-26 NOTE — Patient Instructions (Addendum)
Surgery Feb 7th   Arthroscopic Procedure, Knee An arthroscopic procedure can find what is wrong with your knee. PROCEDURE Arthroscopy is a surgical technique that allows your orthopedic surgeon to diagnose and treat your knee injury with accuracy. They will look into your knee through a small instrument. This is almost like a small (pencil sized) telescope. Because arthroscopy affects your knee less than open knee surgery, you can anticipate a more rapid recovery. Taking an active role by following your caregiver's instructions will help with rapid and complete recovery. Use crutches, rest, elevation, ice, and knee exercises as instructed. The length of recovery depends on various factors including type of injury, age, physical condition, medical conditions, and your rehabilitation. Your knee is the joint between the large bones (femur and tibia) in your leg. Cartilage covers these bone ends which are smooth and slippery and allow your knee to bend and move smoothly. Two menisci, thick, semi-lunar shaped pads of cartilage which form a rim inside the joint, help absorb shock and stabilize your knee. Ligaments bind the bones together and support your knee joint. Muscles move the joint, help support your knee, and take stress off the joint itself. Because of this all programs and physical therapy to rehabilitate an injured or repaired knee require rebuilding and strengthening your muscles. AFTER THE PROCEDURE  After the procedure, you will be moved to a recovery area until most of the effects of the medication have worn off. Your caregiver will discuss the test results with you.   Only take over-the-counter or prescription medicines for pain, discomfort, or fever as directed by your caregiver.    You have been scheduled for arthroscocpic knee surgery.  All surgeries carry some risk.  Remember you always have the option of continued nonsurgical treatment. However in this situation the risks vs. the  benefits favor surgery as the best treatment option. The risks of the surgery includes the following but is not limited to bleeding, infection, pulmonary embolus, death from anesthesia, nerve injury vascular injury or need for further surgery, continued pain.  Specific to this procedure the following risks and complications are rare but possible Stiffness, pain, weakness, giving out  I expect  recovery will be in 3-4 weeks some patients take 6 weeks.  You  will need physical therapy after the procedure  Stop any blood thinning medication: such as warfarin, coumadin, naprosyn, ibuprofen, advil, diclofenac, aspirin

## 2012-10-26 NOTE — Telephone Encounter (Signed)
ERROR BE:QUHK issued for work 10/26/12 - Patient did not receive it (Entered in error) and does not need a Work note.  DISREGARD.

## 2012-10-27 ENCOUNTER — Other Ambulatory Visit: Payer: Self-pay | Admitting: *Deleted

## 2012-10-27 DIAGNOSIS — M1712 Unilateral primary osteoarthritis, left knee: Secondary | ICD-10-CM | POA: Insufficient documentation

## 2012-10-27 NOTE — Progress Notes (Signed)
Patient ID: Michael [name], male   DOB: [birth year], 67 y.o.   MRN: [id number] Chief Complaint  Patient presents with  . Follow-up    MRI results/ left knee     Followup patient still complained of severe left knee pain  The patient had an MRI shows torn medial meniscus however has severe 3 compartment arthritis as well  I discussed this with him I gave him 2 options one was surgical treatment one was nonoperative treatment of the 2 surgical options I gave him an option of arthroscopy versus knee replacement  After discussing this with him he decided to go with the arthroscopic surgery mainly because he is not have help at home that it would require  He also was also she. This point in his life and he wishes to try to remain more independent  We have scheduled surgery for February 7  This is converted her preoperative visit and a full H&P will be dictated. It is incorporated by reference.

## 2012-10-28 ENCOUNTER — Encounter (HOSPITAL_COMMUNITY): Payer: Self-pay | Admitting: Pharmacy Technician

## 2012-10-29 ENCOUNTER — Telehealth: Payer: Self-pay | Admitting: Family Medicine

## 2012-10-30 ENCOUNTER — Telehealth: Payer: Self-pay | Admitting: Orthopedic Surgery

## 2012-10-30 NOTE — Telephone Encounter (Signed)
[place], [employer], ph# [phone], regarding out-patient surgery scheduled 11/06/12 at [facility], CPT codes: [phone], [phone], ICD9 836.0, 717.2, 719.46.  Spoke with Leonette Most, intake coordinator - no pre-authorization is required. Her name and today's date 10/30/12, 2:13pm, for reference.

## 2012-10-30 NOTE — Patient Instructions (Addendum)
GARIN MATA  10/30/2012   Your procedure is scheduled on:  11/06/12  Report to [facility] at [place] [facility].  Call this number if you have problems the morning of surgery: [phone]   Remember:   Do not eat food or drink liquids after midnight.   Take these medicines the morning of surgery with A SIP OF WATER: catapres, prilosec, norvasc, lotensin, neurontin   Do not wear jewelry, make-up or nail polish.  Do not wear lotions, powders, or perfumes. You may wear deodorant.  Do not shave 48 hours prior to surgery. Men may shave face and neck.  Do not bring valuables to the hospital.  Contacts, dentures or bridgework may not be worn into surgery.  Leave suitcase in the car. After surgery it may be brought to your room.  For patients admitted to the hospital, checkout time is 11:00 AM the day of  discharge.   Patients discharged the day of surgery will not be allowed to drive  home.  Name and phone number of your driver: family  Special Instructions: Shower using CHG 2 nights before surgery and the night before surgery.  If you shower the day of surgery use CHG.  Use special wash - you have one bottle of CHG for all showers.  You should use approximately 1/3 of the bottle for each shower.   Please read over the following fact sheets that you were given: Pain Booklet, MRSA Information, Surgical Site Infection Prevention, Anesthesia Post-op Instructions and Care and Recovery After Surgery   PATIENT INSTRUCTIONS POST-ANESTHESIA  IMMEDIATELY FOLLOWING SURGERY:  Do not drive or operate machinery for the first twenty four hours after surgery.  Do not make any important decisions for twenty four hours after surgery or while taking narcotic pain medications or sedatives.  If you develop intractable nausea and vomiting or a severe headache please notify your doctor immediately.  FOLLOW-UP:  Please make an appointment with your surgeon as instructed. You do not need to follow up with anesthesia unless  specifically instructed to do so.  WOUND CARE INSTRUCTIONS (if applicable):  Keep a dry clean dressing on the anesthesia/puncture wound site if there is drainage.  Once the wound has quit draining you may leave it open to air.  Generally you should leave the bandage intact for twenty four hours unless there is drainage.  If the epidural site drains for more than 36-48 hours please call the anesthesia department.  QUESTIONS?:  Please feel free to call your physician or the hospital operator if you have any questions, and they will be happy to assist you.      Arthroscopic Procedure, Knee An arthroscopic procedure can find what is wrong with your knee. PROCEDURE Arthroscopy is a surgical technique that allows your orthopedic surgeon to diagnose and treat your knee injury with accuracy. They will look into your knee through a small instrument. This is almost like a small (pencil sized) telescope. Because arthroscopy affects your knee less than open knee surgery, you can anticipate a more rapid recovery. Taking an active role by following your caregiver's instructions will help with rapid and complete recovery. Use crutches, rest, elevation, ice, and knee exercises as instructed. The length of recovery depends on various factors including type of injury, age, physical condition, medical conditions, and your rehabilitation. Your knee is the joint between the large bones (femur and tibia) in your leg. Cartilage covers these bone ends which are smooth and slippery and allow your knee to bend and move  smoothly. Two menisci, thick, semi-lunar shaped pads of cartilage which form a rim inside the joint, help absorb shock and stabilize your knee. Ligaments bind the bones together and support your knee joint. Muscles move the joint, help support your knee, and take stress off the joint itself. Because of this all programs and physical therapy to rehabilitate an injured or repaired knee require rebuilding and  strengthening your muscles. AFTER THE PROCEDURE  After the procedure, you will be moved to a recovery area until most of the effects of the medication have worn off. Your caregiver will discuss the test results with you.  Only take over-the-counter or prescription medicines for pain, discomfort, or fever as directed by your caregiver. SEEK MEDICAL CARE IF:   You have increased bleeding from your wounds.  You see redness, swelling, or have increasing pain in your wounds.  You have pus coming from your wound.  You have an oral temperature above 102 F (38.9 C).  You notice a bad smell coming from the wound or dressing.  You have severe pain with any motion of your knee. SEEK IMMEDIATE MEDICAL CARE IF:   You develop a rash.  You have difficulty breathing.  You have any allergic problems. Document Released: 09/13/2000 Document Revised: 12/09/2011 Document Reviewed: 04/06/2008 [facility] Patient Information 2013 [place].

## 2012-10-31 ENCOUNTER — Other Ambulatory Visit: Payer: Self-pay | Admitting: Family Medicine

## 2012-11-02 ENCOUNTER — Encounter (HOSPITAL_COMMUNITY)
Admission: RE | Admit: 2012-11-02 | Discharge: 2012-11-02 | Disposition: A | Discharge status: Home / Self Care | Payer: Medicare Other | Source: Ambulatory Visit | Attending: Orthopedic Surgery | Admitting: Orthopedic Surgery

## 2012-11-02 ENCOUNTER — Encounter (HOSPITAL_COMMUNITY): Payer: Self-pay

## 2012-11-02 HISTORY — DX: Gastro-esophageal reflux disease without esophagitis: K21.9

## 2012-11-02 LAB — BASIC METABOLIC PANEL
Calcium: 10.3 mg/dL (ref 8.4–10.5)
GFR calc non Af Amer: >90 mL/min (ref >90)
Glucose, Bld: 129 mg/dL — ABNORMAL HIGH (ref 70–99)
Sodium: 130 mEq/L — ABNORMAL LOW (ref 135–145)

## 2012-11-02 LAB — CBC WITH DIFFERENTIAL/PLATELET
Basophils Absolute: 0.1 10*3/uL (ref 0.0–0.1)
Basophils Relative: 1 % (ref 0–1)
Eosinophils Absolute: 0.2 10*3/uL (ref 0.0–0.7)
Eosinophils Relative: 3 % (ref 0–5)
Lymphs Abs: 1.9 10*3/uL (ref 0.7–4.0)
MCH: 27.2 pg (ref 26.0–34.0)
MCHC: 34.3 g/dL (ref 30.0–36.0)
MCV: 79.3 fL (ref 78.0–100.0)
Platelets: 258 10*3/uL (ref 150–400)
RDW: 14.4 % (ref 11.5–15.5)

## 2012-11-02 LAB — SURGICAL PCR SCREEN: MRSA, PCR: NEGATIVE

## 2012-11-02 MED ORDER — CHLORHEXIDINE GLUCONATE 4 % EX LIQD: 60.0000 mL | Freq: Once | CUTANEOUS | Status: DC

## 2012-11-02 NOTE — Progress Notes (Signed)
11/02/12 0924  OBSTRUCTIVE SLEEP APNEA  Have you ever been diagnosed with sleep apnea through a sleep study? No  Do you snore loudly (loud enough to be heard through closed doors)?  0  Do you often feel tired, fatigued, or sleepy during the daytime? 1  Has anyone observed you stop breathing during your sleep? 0  Do you have, or are you being treated for high blood pressure? 1  BMI more than 35 kg/m2? 1  Age over 66 years old? 1  Neck circumference greater than 40 cm/18 inches? 1  Gender: 1  Obstructive Sleep Apnea Score 6   Score 4 or greater  Results sent to PCP

## 2012-11-04 ENCOUNTER — Other Ambulatory Visit: Payer: Self-pay

## 2012-11-04 MED ORDER — SIMVASTATIN 40 MG PO TABS: 20.0000 mg | ORAL_TABLET | Freq: Every evening | ORAL | Status: DC

## 2012-11-04 MED ORDER — GLIPIZIDE 5 MG PO TABS: 10.0000 mg | ORAL_TABLET | Freq: Two times a day (BID) | ORAL | Status: DC

## 2012-11-04 MED ORDER — ETODOLAC 400 MG PO TABS: 400.0000 mg | ORAL_TABLET | Freq: Two times a day (BID) | ORAL | Status: DC

## 2012-11-04 MED ORDER — AMLODIPINE BESYLATE 10 MG PO TABS: 10.0000 mg | ORAL_TABLET | Freq: Every day | ORAL | Status: DC

## 2012-11-04 NOTE — Telephone Encounter (Signed)
meds sent

## 2012-11-05 NOTE — H&P (Signed)
Michael [name] is an 67 y.o. male.   Chief Complaint   Patient presents with   .  Knee Pain     bilateral knee pain x 1 year, gradual onset, no known injury   REFERRAL : DR Buelah Manis  The patient complains of bilateral anterior knee pain, which started a year ago, and worse over the last 6 months unrelieved by tramadol, and hydrocodone.  He has trouble climbing steps. He is having pain at night with catching. The LEFT knee is worse than the RIGHT. His pain is described as sharp, dull, throbbing, stabbing, and 10 out of 10, and it has become constant. He is also on Lodine, and that hasn't helped as well.  History of fatigue and joint pain joint swelling muscle pain. Otherwise, he lists his review of systems as normal.   Past Medical History  Diagnosis Date  . COPD (chronic obstructive pulmonary disease)   . Diabetes mellitus   . Hyperlipidemia   . Hypertension   . Anemia   . NASH (nonalcoholic steatohepatitis)   . Diabetic neuropathy   . GERD (gastroesophageal reflux disease)     Past Surgical History  Procedure Date  . Hernia repair   . Shoulder surgery     right  . Laparoscopy     stomach    Family History  Problem Relation Age of Onset  . Heart disease Mother   . Hyperlipidemia Mother   . Hypertension Mother   . Depression Mother   . Diabetes Mother   . Heart disease Father   . Hypertension Father   . Hyperlipidemia Father   . Diabetes Father   . Heart disease Sister   . Hyperlipidemia Sister   . Hypertension Sister   . Diabetes Sister   . Diabetes Brother   . Heart disease Sister   . Hyperlipidemia Sister   . Hypertension Sister   . Heart disease Sister   . Hyperlipidemia Sister   . Hypertension Sister   . Diabetes Sister   . Diabetes Brother    Social History:  reports that he has quit smoking. He quit smokeless tobacco use about 2 years ago. He reports that he does not drink alcohol or use illicit drugs.  Allergies: No Known Allergies  No prescriptions  prior to admission    No results found for this or any previous visit (from the past 48 hour(s)). No results found.  ROS see notes above  There were no vitals taken for this visit. Physical Exam  Nursing note and vitals reviewed. Constitutional: He is oriented to person, place, and time. He appears well-developed and well-nourished. No distress.  HENT:  Head: Normocephalic and atraumatic.  Eyes: Pupils are equal, round, and reactive to light.  Neck: Normal range of motion. Neck supple. No JVD present. No tracheal deviation present. No thyromegaly present.  Cardiovascular: Normal rate and intact distal pulses.   Respiratory: Effort normal.  GI: Soft. He exhibits no distension.  Musculoskeletal:       Upper extremity exam  The right and left upper extremity:  Inspection and palpation revealed no abnormalities in the upper extremities.  Range of motion is full without contracture. Motor exam is normal with grade 5 strength. The joints are fully reduced without subluxation. There is no atrophy or tremor and muscle tone is normal.  All joints are stable.       Lymphadenopathy:    He has no cervical adenopathy.  Neurological: He is alert and oriented to person, place,  and time. He has normal reflexes.  Skin: Skin is warm and dry. No rash noted. He is not diaphoretic. No erythema. No pallor.  Psychiatric: He has a normal mood and affect. His behavior is normal. Judgment and thought content normal.    Lower extremity exam  The right knee appears to be normal in terms of its range of motion strength stability and is without palpable tenderness although there is prominence of the tibial tubercle and mild tendinitis/tenderness at the patellar tendon insertions.  The left knee overall has a varus alignment with medial joint line tenderness no significant joint effusion no ligamentous laxity normal strength positive McMurray sign   Assessment/Plan Medial meniscal tear with  osteoarthritis as indicated on MRI  Recommend arthroscopy left knee partial medial meniscectomy  Arther Abbott 11/05/2012, 11:59 AM

## 2012-11-06 ENCOUNTER — Encounter (HOSPITAL_COMMUNITY): Payer: Self-pay | Admitting: Anesthesiology

## 2012-11-06 ENCOUNTER — Encounter (HOSPITAL_COMMUNITY): Payer: Self-pay | Admitting: *Deleted

## 2012-11-06 ENCOUNTER — Encounter (HOSPITAL_COMMUNITY)
Admission: RE | Disposition: A | Discharge status: Home / Self Care | Payer: Self-pay | Source: Ambulatory Visit | Attending: Orthopedic Surgery

## 2012-11-06 ENCOUNTER — Ambulatory Visit (HOSPITAL_COMMUNITY): Payer: Medicare Other | Admitting: Anesthesiology

## 2012-11-06 ENCOUNTER — Ambulatory Visit (HOSPITAL_COMMUNITY)
Admission: RE | Admit: 2012-11-06 | Discharge: 2012-11-06 | Disposition: A | Discharge status: Home / Self Care | Payer: Medicare Other | Source: Ambulatory Visit | Attending: Orthopedic Surgery | Admitting: Orthopedic Surgery

## 2012-11-06 DIAGNOSIS — Z01810 Encounter for preprocedural cardiovascular examination: Secondary | ICD-10-CM | POA: Insufficient documentation

## 2012-11-06 DIAGNOSIS — Z01812 Encounter for preprocedural laboratory examination: Secondary | ICD-10-CM | POA: Insufficient documentation

## 2012-11-06 DIAGNOSIS — E119 Type 2 diabetes mellitus without complications: Secondary | ICD-10-CM | POA: Insufficient documentation

## 2012-11-06 DIAGNOSIS — IMO0002 Reserved for concepts with insufficient information to code with codable children: Secondary | ICD-10-CM

## 2012-11-06 DIAGNOSIS — S83209A Unspecified tear of unspecified meniscus, current injury, unspecified knee, initial encounter: Secondary | ICD-10-CM

## 2012-11-06 DIAGNOSIS — I10 Essential (primary) hypertension: Secondary | ICD-10-CM | POA: Insufficient documentation

## 2012-11-06 DIAGNOSIS — J449 Chronic obstructive pulmonary disease, unspecified: Secondary | ICD-10-CM | POA: Insufficient documentation

## 2012-11-06 DIAGNOSIS — M23329 Other meniscus derangements, posterior horn of medial meniscus, unspecified knee: Secondary | ICD-10-CM | POA: Insufficient documentation

## 2012-11-06 DIAGNOSIS — J4489 Other specified chronic obstructive pulmonary disease: Secondary | ICD-10-CM | POA: Insufficient documentation

## 2012-11-06 HISTORY — PX: KNEE ARTHROSCOPY WITH MEDIAL MENISECTOMY: SHX5651

## 2012-11-06 SURGERY — ARTHROSCOPY, KNEE, WITH MEDIAL MENISCECTOMY
Anesthesia: General | Site: Knee | Laterality: Left | Wound class: Clean

## 2012-11-06 MED ORDER — ROCURONIUM BROMIDE 50 MG/5ML IV SOLN
INTRAVENOUS | Status: AC
  Filled 2012-11-06: qty 1

## 2012-11-06 MED ORDER — BUPIVACAINE-EPINEPHRINE PF 0.5-1:200000 % IJ SOLN
INTRAMUSCULAR | Status: AC
  Filled 2012-11-06: qty 20

## 2012-11-06 MED ORDER — HYDROCODONE-ACETAMINOPHEN 7.5-325 MG PO TABS
ORAL_TABLET | ORAL | Status: AC
  Filled 2012-11-06: qty 1

## 2012-11-06 MED ORDER — SODIUM CHLORIDE 0.9 % IR SOLN
Status: DC | PRN
  Administered 2012-11-06: 1000 mL

## 2012-11-06 MED ORDER — KETOROLAC TROMETHAMINE 30 MG/ML IJ SOLN
INTRAMUSCULAR | Status: AC
  Filled 2012-11-06: qty 1

## 2012-11-06 MED ORDER — FENTANYL CITRATE 0.05 MG/ML IJ SOLN
INTRAMUSCULAR | Status: AC
  Filled 2012-11-06: qty 4

## 2012-11-06 MED ORDER — MIDAZOLAM HCL 2 MG/2ML IJ SOLN
INTRAMUSCULAR | Status: AC
  Filled 2012-11-06: qty 2

## 2012-11-06 MED ORDER — ONDANSETRON HCL 4 MG/2ML IJ SOLN
INTRAMUSCULAR | Status: AC
  Filled 2012-11-06: qty 2

## 2012-11-06 MED ORDER — CEFAZOLIN SODIUM-DEXTROSE 2-3 GM-% IV SOLR
INTRAVENOUS | Status: DC | PRN
  Administered 2012-11-06: 2 g via INTRAVENOUS

## 2012-11-06 MED ORDER — EPINEPHRINE HCL 1 MG/ML IJ SOLN
INTRAMUSCULAR | Status: AC
  Filled 2012-11-06: qty 5

## 2012-11-06 MED ORDER — PROMETHAZINE HCL 12.5 MG PO TABS: 12.5000 mg | ORAL_TABLET | Freq: Four times a day (QID) | ORAL | Status: DC | PRN

## 2012-11-06 MED ORDER — ROCURONIUM BROMIDE 100 MG/10ML IV SOLN
INTRAVENOUS | Status: DC | PRN
  Administered 2012-11-06: 25 mg via INTRAVENOUS

## 2012-11-06 MED ORDER — SODIUM CHLORIDE 0.9 % IR SOLN
Status: DC | PRN
  Administered 2012-11-06 (×4)

## 2012-11-06 MED ORDER — HYDROCODONE-ACETAMINOPHEN 5-325 MG PO TABS
1.0000 | ORAL_TABLET | Freq: Once | ORAL | Status: AC
  Administered 2012-11-06: 1 via ORAL

## 2012-11-06 MED ORDER — KETOROLAC TROMETHAMINE 30 MG/ML IJ SOLN
30.0000 mg | Freq: Once | INTRAMUSCULAR | Status: AC
  Administered 2012-11-06: 30 mg via INTRAVENOUS

## 2012-11-06 MED ORDER — PROPOFOL 10 MG/ML IV EMUL
INTRAVENOUS | Status: AC
  Filled 2012-11-06: qty 20

## 2012-11-06 MED ORDER — SUCCINYLCHOLINE CHLORIDE 20 MG/ML IJ SOLN
INTRAMUSCULAR | Status: DC | PRN
  Administered 2012-11-06: 160 mg via INTRAVENOUS

## 2012-11-06 MED ORDER — ONDANSETRON HCL 4 MG/2ML IJ SOLN
4.0000 mg | Freq: Once | INTRAMUSCULAR | Status: AC | PRN
  Administered 2012-11-06: 4 mg via INTRAVENOUS

## 2012-11-06 MED ORDER — MIDAZOLAM HCL 2 MG/2ML IJ SOLN
1.0000 mg | INTRAMUSCULAR | Status: DC | PRN
  Administered 2012-11-06 (×2): 2 mg via INTRAVENOUS

## 2012-11-06 MED ORDER — HYDROCODONE-ACETAMINOPHEN 5-325 MG PO TABS
ORAL_TABLET | ORAL | Status: AC
  Filled 2012-11-06: qty 1

## 2012-11-06 MED ORDER — FENTANYL CITRATE 0.05 MG/ML IJ SOLN
INTRAMUSCULAR | Status: AC
  Filled 2012-11-06: qty 2

## 2012-11-06 MED ORDER — CEFAZOLIN SODIUM-DEXTROSE 2-3 GM-% IV SOLR: 2.0000 g | INTRAVENOUS | Status: DC

## 2012-11-06 MED ORDER — ONDANSETRON HCL 4 MG/2ML IJ SOLN: 4.0000 mg | Freq: Once | INTRAMUSCULAR | Status: DC

## 2012-11-06 MED ORDER — SUCCINYLCHOLINE CHLORIDE 20 MG/ML IJ SOLN
INTRAMUSCULAR | Status: AC
  Filled 2012-11-06: qty 1

## 2012-11-06 MED ORDER — FENTANYL CITRATE 0.05 MG/ML IJ SOLN
25.0000 ug | INTRAMUSCULAR | Status: DC | PRN
  Administered 2012-11-06 (×2): 50 ug via INTRAVENOUS

## 2012-11-06 MED ORDER — FENTANYL CITRATE 0.05 MG/ML IJ SOLN
INTRAMUSCULAR | Status: DC | PRN
  Administered 2012-11-06: 100 ug via INTRAVENOUS
  Administered 2012-11-06 (×4): 50 ug via INTRAVENOUS
  Administered 2012-11-06: 100 ug via INTRAVENOUS

## 2012-11-06 MED ORDER — PROPOFOL 10 MG/ML IV BOLUS
INTRAVENOUS | Status: DC | PRN
Start: 2012-11-06 — End: 2012-11-06
  Administered 2012-11-06: 170 mg via INTRAVENOUS

## 2012-11-06 MED ORDER — CEFAZOLIN SODIUM-DEXTROSE 2-3 GM-% IV SOLR
INTRAVENOUS | Status: AC
  Filled 2012-11-06: qty 50

## 2012-11-06 MED ORDER — BUPIVACAINE-EPINEPHRINE PF 0.5-1:200000 % IJ SOLN
INTRAMUSCULAR | Status: DC | PRN
  Administered 2012-11-06: 60 mL

## 2012-11-06 MED ORDER — MIDAZOLAM HCL 5 MG/5ML IJ SOLN
INTRAMUSCULAR | Status: DC | PRN
  Administered 2012-11-06: 2 mg via INTRAVENOUS

## 2012-11-06 MED ORDER — LIDOCAINE HCL (PF) 1 % IJ SOLN
INTRAMUSCULAR | Status: AC
  Filled 2012-11-06: qty 5

## 2012-11-06 MED ORDER — ONDANSETRON HCL 4 MG/2ML IJ SOLN
4.0000 mg | Freq: Once | INTRAMUSCULAR | Status: AC
  Administered 2012-11-06: 4 mg via INTRAVENOUS

## 2012-11-06 MED ORDER — HYDROCODONE-ACETAMINOPHEN 7.5-325 MG PO TABS: 1.0000 | ORAL_TABLET | ORAL | Status: DC | PRN

## 2012-11-06 MED ORDER — LIDOCAINE HCL 1 % IJ SOLN
INTRAMUSCULAR | Status: DC | PRN
  Administered 2012-11-06: 50 mg via INTRADERMAL

## 2012-11-06 MED ORDER — LACTATED RINGERS IV SOLN
INTRAVENOUS | Status: DC
  Administered 2012-11-06: 1000 mL via INTRAVENOUS

## 2012-11-06 SURGICAL SUPPLY — 53 items
ARTHROWAND PARAGON T2 (SURGICAL WAND)
BAG HAMPER (MISCELLANEOUS) ×2 IMPLANT
BANDAGE ELASTIC 6 VELCRO NS (GAUZE/BANDAGES/DRESSINGS) ×2 IMPLANT
BLADE AGGRESSIVE PLUS 4.0 (BLADE) ×2 IMPLANT
BLADE SURG SZ11 CARB STEEL (BLADE) ×2 IMPLANT
CHLORAPREP W/TINT 26ML (MISCELLANEOUS) ×4 IMPLANT
CLOTH BEACON ORANGE TIMEOUT ST (SAFETY) ×2 IMPLANT
COOLER CRYO IC GRAV AND TUBE (ORTHOPEDIC SUPPLIES) ×2 IMPLANT
CUFF CRYO KNEE LG 20X31 COOLER (ORTHOPEDIC SUPPLIES) IMPLANT
CUFF CRYO KNEE18X23 MED (MISCELLANEOUS) ×1 IMPLANT
CUFF TOURNIQUET SINGLE 34IN LL (TOURNIQUET CUFF) ×1 IMPLANT
CUFF TOURNIQUET SINGLE 44IN (TOURNIQUET CUFF) IMPLANT
CUTTER ANGLED DBL BITE 4.5 (BURR) IMPLANT
DECANTER SPIKE VIAL GLASS SM (MISCELLANEOUS) ×4 IMPLANT
GAUZE SPONGE 4X4 16PLY XRAY LF (GAUZE/BANDAGES/DRESSINGS) ×2 IMPLANT
GAUZE XEROFORM 5X9 LF (GAUZE/BANDAGES/DRESSINGS) ×2 IMPLANT
GLOVE INDICATOR 7.0 STRL GRN (GLOVE) ×1 IMPLANT
GLOVE INDICATOR 8.0 STRL GRN (GLOVE) ×1 IMPLANT
GLOVE SKINSENSE NS SZ8.0 LF (GLOVE) ×1
GLOVE SKINSENSE STRL SZ8.0 LF (GLOVE) ×1 IMPLANT
GLOVE SS BIOGEL STRL SZ 6.5 (GLOVE) IMPLANT
GLOVE SS N UNI LF 8.5 STRL (GLOVE) ×2 IMPLANT
GLOVE SUPERSENSE BIOGEL SZ 6.5 (GLOVE) ×1
GOWN STRL REIN XL XLG (GOWN DISPOSABLE) ×5 IMPLANT
HLDR LEG FOAM (MISCELLANEOUS) ×1 IMPLANT
IV NS IRRIG 3000ML ARTHROMATIC (IV SOLUTION) ×6 IMPLANT
KIT BLADEGUARD II DBL (SET/KITS/TRAYS/PACK) ×2 IMPLANT
KIT ROOM TURNOVER AP CYSTO (KITS) ×2 IMPLANT
LEG HOLDER FOAM (MISCELLANEOUS) ×1
MANIFOLD NEPTUNE II (INSTRUMENTS) ×2 IMPLANT
MARKER SKIN DUAL TIP RULER LAB (MISCELLANEOUS) ×2 IMPLANT
NDL HYPO 18GX1.5 BLUNT FILL (NEEDLE) ×1 IMPLANT
NDL HYPO 21X1.5 SAFETY (NEEDLE) ×1 IMPLANT
NDL SPNL 18GX3.5 QUINCKE PK (NEEDLE) ×1 IMPLANT
NEEDLE HYPO 18GX1.5 BLUNT FILL (NEEDLE) ×2 IMPLANT
NEEDLE HYPO 21X1.5 SAFETY (NEEDLE) ×2 IMPLANT
NEEDLE SPNL 18GX3.5 QUINCKE PK (NEEDLE) ×2 IMPLANT
NS IRRIG 1000ML POUR BTL (IV SOLUTION) ×2 IMPLANT
PACK ARTHRO LIMB DRAPE STRL (MISCELLANEOUS) ×2 IMPLANT
PAD ABD 5X9 TENDERSORB (GAUZE/BANDAGES/DRESSINGS) ×2 IMPLANT
PAD ARMBOARD 7.5X6 YLW CONV (MISCELLANEOUS) ×2 IMPLANT
PADDING CAST COTTON 6X4 STRL (CAST SUPPLIES) ×2 IMPLANT
SET ARTHROSCOPY INST (INSTRUMENTS) ×2 IMPLANT
SET ARTHROSCOPY PUMP TUBE (IRRIGATION / IRRIGATOR) ×2 IMPLANT
SET BASIN LINEN APH (SET/KITS/TRAYS/PACK) ×2 IMPLANT
SPONGE GAUZE 4X4 12PLY (GAUZE/BANDAGES/DRESSINGS) ×2 IMPLANT
SUT ETHILON 3 0 FSL (SUTURE) ×1 IMPLANT
SYR 30ML LL (SYRINGE) ×2 IMPLANT
SYRINGE 10CC LL (SYRINGE) ×2 IMPLANT
WAND 50 DEG COVAC W/CORD (SURGICAL WAND) IMPLANT
WAND 90 DEG TURBOVAC W/CORD (SURGICAL WAND) IMPLANT
WAND ARTHRO PARAGON T2 (SURGICAL WAND) IMPLANT
YANKAUER SUCT BULB TIP 10FT TU (MISCELLANEOUS) ×6 IMPLANT

## 2012-11-06 NOTE — Interval H&P Note (Signed)
History and Physical Interval Note:  11/06/2012 9:25 AM  Michael [name]  has presented today for surgery, with the diagnosis of torn medial meniscus left knee  The various methods of treatment have been discussed with the patient and family. After consideration of risks, benefits and other options for treatment, the patient has consented to  Procedure(s) (LRB) with comments: KNEE ARTHROSCOPY WITH MEDIAL MENISECTOMY (Left) as a surgical intervention .  The patient's history has been reviewed, patient examined, no change in status, stable for surgery.  I have reviewed the patient's chart and labs.  Questions were answered to the patient's satisfaction.     Arther Abbott

## 2012-11-06 NOTE — Addendum Note (Signed)
Addendum  created 11/06/12 1145 by Charmaine Downs, CRNA   Modules edited:Anesthesia Medication Administration

## 2012-11-06 NOTE — Op Note (Signed)
11/06/2012  10:45 AM  PATIENT:  Michael [name]  67 y.o. male  PRE-OPERATIVE DIAGNOSIS:  torn medial meniscus left knee  POST-OPERATIVE DIAGNOSIS:  torn medial meniscus left knee  FINDINGS: MEDIAL MENISCUS TEAR, POSTERIOR HORN; GRADE 2 CHONDRAL LESION PATELLA, SYNOVITIS MODERATE    PROCEDURE:  Procedure(s) (LRB) with comments: KNEE ARTHROSCOPY WITH MEDIAL MENISECTOMY (Left)  Details of procedure: The patient was identified in the preop area and the surgical site was confirmed as left knee and marked. The chart was reviewed and updated. The patient was taken to the operating room and given the appropriate dose of antibiotics per protocol. After intubation the left leg was prepped and draped in sterile technique.  With the left leg in the left leg holder a timeout procedure was completed and and a diagnostic arthroscopy was performed through a lateral portal. A medial portal was established and a second diagnostic scope was performed. Through the medial portal the meniscal tear was resected and the meniscal fragments were removed with a motorized shaver. The meniscus was then probed for stability and found to be stable. Rate debridements were done to improve visualization there was a moderate amount of synovitis in the joint. There was a grade 2 chondral lesion of the lateral facet of the patella which was left intact.  The knee was irrigated and suctioned for any residual meniscal fragments and then closed with 3-0 nylon. Marcaine with epinephrine 60 cc was injected into the knee. A sterile bandage was applied. Cryo/Cuff was placed and activated.  Extubation was performed and the patient was taken to the postoperative suite  for recovery  SURGEON:  Surgeon(s) and Role:    * Carole Civil, MD - Primary  PHYSICIAN ASSISTANT:   ASSISTANTS: none   ANESTHESIA:   general  EBL:  Total I/O In: 800 [I.V.:800] Out: -   BLOOD ADMINISTERED:none  DRAINS: none   LOCAL MEDICATIONS USED:   MARCAINE     SPECIMEN:  No Specimen  DISPOSITION OF SPECIMEN:  N/A  COUNTS:  YES  TOURNIQUET:  * Missing tourniquet times found for documented tourniquets in log:  81950 *  DICTATION: .Dragon Dictation  PLAN OF CARE: Discharge to home after PACU  PATIENT DISPOSITION:  PACU - hemodynamically stable.   Delay start of Pharmacological VTE agent (>24hrs) due to surgical blood loss or risk of bleeding: not applicable

## 2012-11-06 NOTE — OR Nursing (Signed)
30 second scrub to left knee while in pre-procedure.

## 2012-11-06 NOTE — Anesthesia Procedure Notes (Signed)
Procedure Name: Intubation Date/Time: 11/06/2012 9:44 AM Performed by: Charmaine Downs Pre-anesthesia Checklist: Emergency Drugs available, Suction available, Patient being monitored and Patient identified Patient Re-evaluated:Patient Re-evaluated prior to inductionOxygen Delivery Method: Circle system utilized Preoxygenation: Pre-oxygenation with 100% oxygen Intubation Type: IV induction, Cricoid Pressure applied and Rapid sequence Ventilation: Mask ventilation without difficulty Laryngoscope Size: Mac and 3 Grade View: Grade I Tube type: Oral Tube size: 8.0 mm Number of attempts: 1 Airway Equipment and Method: Stylet Placement Confirmation: ETT inserted through vocal cords under direct vision,  positive ETCO2 and breath sounds checked- equal and bilateral Secured at: 22 cm Tube secured with: Tape Dental Injury: Teeth and Oropharynx as per pre-operative assessment

## 2012-11-06 NOTE — Anesthesia Preprocedure Evaluation (Addendum)
Anesthesia Evaluation  Patient identified by MRN, date of birth, ID band Patient awake    Reviewed: Allergy & Precautions, H&P , NPO status , Patient's Chart, lab work & pertinent test results  Airway Mallampati: II TM Distance: >3 FB     Dental  (+) Teeth Intact and Poor Dentition   Pulmonary COPDformer smoker,  breath sounds clear to auscultation        Cardiovascular hypertension, Pt. on medications Rhythm:Regular Rate:Normal     Neuro/Psych    GI/Hepatic GERD-  Medicated and Controlled,(+) Hepatitis - ([facility])  Endo/Other  diabetes, Type 2, Oral Hypoglycemic Agents  Renal/GU      Musculoskeletal   Abdominal   Peds  Hematology   Anesthesia Other Findings   Reproductive/Obstetrics                          Anesthesia Physical Anesthesia Plan  ASA: III  Anesthesia Plan: General   Post-op Pain Management:    Induction: Intravenous, Rapid sequence and Cricoid pressure planned  Airway Management Planned:   Additional Equipment:   Intra-op Plan:   Post-operative Plan: Extubation in OR  Informed Consent: I have reviewed the patients History and Physical, chart, labs and discussed the procedure including the risks, benefits and alternatives for the proposed anesthesia with the patient or authorized representative who has indicated his/her understanding and acceptance.     Plan Discussed with:   Anesthesia Plan Comments:         Anesthesia Quick Evaluation

## 2012-11-06 NOTE — Anesthesia Postprocedure Evaluation (Signed)
  Anesthesia Post-op Note  Patient: Michael [name]  Procedure(s) Performed: Procedure(s) (LRB) with comments: KNEE ARTHROSCOPY WITH MEDIAL MENISECTOMY (Left)  Patient Location: PACU  Anesthesia Type:General  Level of Consciousness: awake, alert , oriented and patient cooperative  Airway and Oxygen Therapy: Patient Spontanous Breathing  Post-op Pain: 3 /10, mild  Post-op Assessment: Post-op Vital signs reviewed, Patient's Cardiovascular Status Stable, Respiratory Function Stable, Patent Airway, Adequate PO intake and Pain level controlled  Post-op Vital Signs: Reviewed and stable  Complications: No apparent anesthesia complications

## 2012-11-06 NOTE — Brief Op Note (Addendum)
11/06/2012  10:45 AM  PATIENT:  Michael [name]  67 y.o. male  PRE-OPERATIVE DIAGNOSIS:  torn medial meniscus left knee  POST-OPERATIVE DIAGNOSIS:  torn medial meniscus left knee  FINDINGS: MEDIAL MENISCUS TEAR, POSTERIOR HORN; GRADE 2 CHONDRAL LESION PATELLA, SYNOVITIS MODERATE    PROCEDURE:  Procedure(s) (LRB) with comments: KNEE ARTHROSCOPY WITH MEDIAL MENISECTOMY (Left)  Details of procedure: The patient was identified in the preop area and the surgical site was confirmed as left knee and marked. The chart was reviewed and updated. The patient was taken to the operating room and given the appropriate dose of antibiotics per protocol. After intubation the left leg was prepped and draped in sterile technique.  With the left leg in the left leg holder a timeout procedure was completed and and a diagnostic arthroscopy was performed through a lateral portal. A medial portal was established and a second diagnostic scope was performed. Through the medial portal the meniscal tear was resected and the meniscal fragments were removed with a motorized shaver. The meniscus was then probed for stability and found to be stable. Rate debridements were done to improve visualization there was a moderate amount of synovitis in the joint. There was a grade 2 chondral lesion of the lateral facet of the patella which was left intact.  The knee was irrigated and suctioned for any residual meniscal fragments and then closed with 3-0 nylon. Marcaine with epinephrine 60 cc was injected into the knee. A sterile bandage was applied. Cryo/Cuff was placed and activated.  Extubation was performed and the patient was taken to the postoperative suite  for recovery  SURGEON:  Surgeon(s) and Role:    * Carole Civil, MD - Primary  PHYSICIAN ASSISTANT:   ASSISTANTS: none   ANESTHESIA:   general  EBL:  Total I/O In: 800 [I.V.:800] Out: -   BLOOD ADMINISTERED:none  DRAINS: none   LOCAL MEDICATIONS USED:   MARCAINE     SPECIMEN:  No Specimen  DISPOSITION OF SPECIMEN:  N/A  COUNTS:  YES  TOURNIQUET:  * Missing tourniquet times found for documented tourniquets in log:  81950 *  DICTATION: .Dragon Dictation  PLAN OF CARE: Discharge to home after PACU  PATIENT DISPOSITION:  PACU - hemodynamically stable.   Delay start of Pharmacological VTE agent (>24hrs) due to surgical blood loss or risk of bleeding: not applicable

## 2012-11-06 NOTE — Transfer of Care (Signed)
Immediate Anesthesia Transfer of Care Note  Patient: Michael [name]  Procedure(s) Performed: Procedure(s) (LRB) with comments: KNEE ARTHROSCOPY WITH MEDIAL MENISECTOMY (Left)  Patient Location: PACU  Anesthesia Type:General  Level of Consciousness: awake and patient cooperative  Airway & Oxygen Therapy: Patient Spontanous Breathing and Patient connected to face mask oxygen  Post-op Assessment: Report given to PACU RN, Post -op Vital signs reviewed and stable and Patient moving all extremities  Post vital signs: Reviewed and stable  Complications: No apparent anesthesia complications

## 2012-11-06 NOTE — Addendum Note (Signed)
Addendum  created 11/06/12 1530 by Vista Deck, CRNA   Modules edited:Charges VN

## 2012-11-09 ENCOUNTER — Encounter (HOSPITAL_COMMUNITY): Payer: Self-pay | Admitting: Orthopedic Surgery

## 2012-11-09 ENCOUNTER — Ambulatory Visit (INDEPENDENT_AMBULATORY_CARE_PROVIDER_SITE_OTHER): Payer: Medicare Other | Admitting: Orthopedic Surgery

## 2012-11-09 VITALS — BP 132/60 | Ht 63.0 in | Wt 211.0 lb

## 2012-11-09 DIAGNOSIS — Z9889 Other specified postprocedural states: Secondary | ICD-10-CM

## 2012-11-09 NOTE — Patient Instructions (Addendum)
Continue ice 3 x a day   Call hospital to arrange PT

## 2012-11-09 NOTE — Progress Notes (Signed)
Patient ID: Michael [name], male   DOB: 08/22/1946, 67 y.o.   MRN: [id number] Chief Complaint  Patient presents with  . Follow-up    Post op 1 SALK DOS 11/06/12   BP 132/60  Ht 5' 3"  (1.6 m)  Wt 211 lb (95.709 kg)  BMI 37.39 kg/m2  Status post arthroscopy left knee partial medial meniscectomy the patient had some mild chondromalacia grade 2 of his patellofemoral joint.  He is doing well pain is well controlled is ready to start therapy sutures are removed suture lines looked good his flexion is 95  Followup 3 weeks start therapy

## 2012-11-16 ENCOUNTER — Ambulatory Visit: Payer: Medicare Other | Admitting: Orthopedic Surgery

## 2012-11-17 ENCOUNTER — Ambulatory Visit (HOSPITAL_COMMUNITY)
Admission: RE | Admit: 2012-11-17 | Discharge: 2012-11-17 | Disposition: A | Discharge status: Home / Self Care | Payer: Medicare Other | Source: Ambulatory Visit | Attending: Orthopedic Surgery | Admitting: Orthopedic Surgery

## 2012-11-17 DIAGNOSIS — E119 Type 2 diabetes mellitus without complications: Secondary | ICD-10-CM | POA: Insufficient documentation

## 2012-11-17 DIAGNOSIS — M25569 Pain in unspecified knee: Secondary | ICD-10-CM | POA: Insufficient documentation

## 2012-11-17 DIAGNOSIS — I10 Essential (primary) hypertension: Secondary | ICD-10-CM | POA: Insufficient documentation

## 2012-11-17 DIAGNOSIS — IMO0001 Reserved for inherently not codable concepts without codable children: Secondary | ICD-10-CM | POA: Insufficient documentation

## 2012-11-17 DIAGNOSIS — M6281 Muscle weakness (generalized): Secondary | ICD-10-CM | POA: Insufficient documentation

## 2012-11-17 NOTE — Evaluation (Signed)
Physical Therapy Evaluation  Patient Details  Name: TAMARICK KOVALCIK MRN: [id number] Date of Birth: Jul 09, 1946  Today's Date: 11/17/2012 Time: 0805-0845 PT Time Calculation (min): 40 min Charges: 1 eval, 15' TE Visit#: 1 of 9  Re-eval: 11/30/12 (re-eval prior to MD apt on 12/01/12) Assessment Diagnosis: L knee SALK procedure Surgical Date: 11/06/12 Next MD Visit: Dr. Aline Brochure - November 30 2012 Prior Therapy: Conservative treatment  Authorization: MEDICARE  Authorization Time Period:    Authorization Visit#: 1 of 10   Past Medical History:  Past Medical History  Diagnosis Date  . COPD (chronic obstructive pulmonary disease)   . Diabetes mellitus   . Hyperlipidemia   . Hypertension   . Anemia   . NASH (nonalcoholic steatohepatitis)   . Diabetic neuropathy   . GERD (gastroesophageal reflux disease)    Past Surgical History:  Past Surgical History  Procedure Laterality Date  . Hernia repair    . Shoulder surgery      right  . Laparoscopy      stomach  . Knee arthroscopy with medial menisectomy Left 11/06/2012    Procedure: KNEE ARTHROSCOPY WITH MEDIAL MENISECTOMY;  Surgeon: Carole Civil, MD;  Location: [facility];  Service: Orthopedics;  Laterality: Left;    Subjective Symptoms/Limitations Pertinent History: Pt is referred to PT s/p L knee SALK on 11/06/12.  He was a previous pt of this facility who attend rehab for conservative treatment before surgery with no lasting effect.  He states that his pain has defintly improved since the surgery. He is using a SPC for community mobility and furniture walking at home. C/co are pain to the knee and hip and difficulty walking independently.  He states he has been doing 3 sets of 25 heel slides each day.  How long can you sit comfortably?: pain with STS How long can you stand comfortably?: less than 5 minutes How long can you walk comfortably?: 5 minutes Patient Stated Goals: "I want to finally ge rid of my SPC" Pain  Assessment Currently in Pain?: Yes Pain Score:   6 Pain Location: Knee Pain Orientation: Left Pain Type: Acute pain;Surgical pain Pain Relieving Factors: hydrocodine, ice Effect of Pain on Daily Activities: unable to ambulate independently.   Sensation/Coordination/Flexibility/Functional Tests Functional Tests Functional Tests: ABC: 24% Functional Tests: 30 sec STS: 0x complete  Assessment LLE AROM (degrees) Left Knee Extension: 0 Left Knee Flexion: 100 LLE PROM (degrees) Left Knee Extension: 0 Left Knee Flexion: 105 LLE Strength Left Hip Flexion: 3/5 Left Hip Extension: 2-/5 Left Hip ABduction: 3+/5 Left Hip ADduction: 2/5 Left Knee Flexion: 3/5 Left Knee Extension: 3/5  Mobility/Balance  Ambulation/Gait Ambulation/Gait: Yes Assistive device: Straight cane Gait Pattern: Trunk flexed;Antalgic;Lateral hip instability Stairs: Yes Stairs Assistance: 6: Modified independent (Device/Increase time) Stair Management Technique: Two rails Posture/Postural Control Posture/Postural Control: Postural limitations Postural Limitations: 18 degrees trunk flexion in standing Static Standing Balance Single Leg Stance - Right Leg: 10 Single Leg Stance - Left Leg: 5 Tandem Stance - Right Leg: 10 Tandem Stance - Left Leg: 5 Rhomberg - Eyes Opened: 10 Rhomberg - Eyes Closed: 10   Exercise/Treatments Standing Heel Raises: 10 reps;Limitations Heel Raises Limitations: Toe raises 10 reps Knee Flexion: Left;Limitations Knee Flexion Limitations: 25 Functional Squat: 10 reps Stairs: 3 RT w/2 handrails on small steps in gym -instruction for reciprocal pattern Seated Long Arc Quad: Left;15 reps Heel Slides: Left;Limitations Heel Slides Limitations: 25 Sidelying Hip ABduction: Left;5 reps Hip ADduction: Left;5 reps Clams: Left; 10 reps  Physical Therapy  Assessment and Plan PT Assessment and Plan Clinical Impression Statement: Pt is a 67 year old male s/p SALK procedure on 11/06/12  and has a hx of conservative PT treatment prior to surgery.  After evaluation, pt is most limited by his impaired gait mechanics and decreased L LE strength.   Pt will benefit from skilled therapeutic intervention in order to improve on the following deficits: Abnormal gait;Decreased balance;Difficulty walking;Decreased strength;Decreased range of motion;Decreased mobility;Increased edema;Impaired perceived functional ability;Pain;Improper body mechanics Rehab Potential: Good PT Frequency: Min 3X/week PT Duration:  (3 weeks) PT Treatment/Interventions: Gait training;Stair training;Functional mobility training;Therapeutic activities;Therapeutic exercise;Balance training;Neuromuscular re-education;Patient/family education;Manual techniques;Modalities PT Plan: Iontophoresis and TENS unit requested from MD if needed.  Focus on improving LE strength and confidence with balance to progress towards independent gait to reach functional goals.     Goals Home Exercise Program Pt will Perform Home Exercise Program: Independently PT Goal: Perform Home Exercise Program - Progress: Goal set today PT Short Term Goals Time to Complete Short Term Goals: 3 weeks PT Short Term Goal 1: PT will report pain less than 3/10 for 75% of his day for improved QOL>  PT Short Term Goal 2: Pt will improve LE strength to [facility] in order to ambulate independently x30 minutes to continue comfortably with community ambulation.  PT Short Term Goal 3: Pt will improve dynamic balance in order to ambulate independently in outdoor environment.  PT Short Term Goal 4: Pt will improve his ABC to greater than 40% for improved percieved functional ability.  PT Short Term Goal 5: Pt will improve L knee AROM to [facility] in order to go from STS w/o UE A.   Problem List Patient Active Problem List  Diagnosis  . DIABETES MELLITUS, WITHOUT COMPLICATIONS  . HYPERLIPIDEMIA  . OBESITY, MORBID  . HYPERTENSION  . GERD  . ARTHRITIS  . NASH  (nonalcoholic steatohepatitis)  . Knee pain, bilateral  . Leg pain  . Patellar tendonitis  . Leg weakness  . Back pain  . Meniscus tear  . Osteoarthritis of left knee  . S/P arthroscopy of left knee    PT Plan of Care PT Home Exercise Plan: see scanned report PT Patient Instructions:  importance of HEP Consulted and Agree with Plan of Care: Patient  GP Functional Assessment Tool Used: ABC Functional Limitation: Mobility: Walking and moving around Mobility: Walking and Moving Around Current Status ([id number]): At least 60 percent but less than 80 percent impaired, limited or restricted Mobility: Walking and Moving Around Goal Status [phone]): At least 40 percent but less than 60 percent impaired, limited or restricted  Jalen Daluz, PT 11/17/2012, 8:56 AM  Physician Documentation Your signature is required to indicate approval of the treatment plan as stated above.  Please sign and either send electronically or make a copy of this report for your files and return this physician signed original.   Please mark one 1.__approve of plan  2. ___approve of plan with the following conditions.   ______________________________                                                          _____________________ Physician Signature  Date

## 2012-11-19 ENCOUNTER — Ambulatory Visit (HOSPITAL_COMMUNITY)
Admission: RE | Admit: 2012-11-19 | Discharge: 2012-11-19 | Disposition: A | Discharge status: Home / Self Care | Payer: Medicare Other | Source: Ambulatory Visit | Attending: Orthopedic Surgery | Admitting: Orthopedic Surgery

## 2012-11-19 NOTE — Progress Notes (Signed)
Physical Therapy Treatment Patient Details  Name: Michael [name] MRN: [id number] Date of Birth: [birth year]  Today's Date: 11/19/2012 Time: 0800-0856 PT Time Calculation (min): 56 min  Visit#: 2 of 9  Re-eval: 11/30/12  Charge: Gait 8', therex 84', ice 10  Authorization: MEDICARE  Authorization Time Period:    Authorization Visit#: 2 of 10   Subjective: Symptoms/Limitations Symptoms: Pt stated knee can tell bad weather is on the way.  Pain scale 6/10 today. Pain Assessment Pain Score:   6 Pain Location: Knee Pain Orientation: Left  Objective:   Exercise/Treatments Aerobic Stationary Bike: 6' for ROM, statrted backwards then able to go forwards Standing Heel Raises: 10 reps;Limitations Heel Raises Limitations: Toe raises 10 reps Knee Flexion: Left;Limitations Knee Flexion Limitations: 25 Lateral Step Up: Left;5 reps;Hand Hold: 1;Step Height: 4" Functional Squat: 15 reps Gait Training: Gait training 48' with QC with cueing for equalized gait mechanics and posture x 48mn Other Standing Knee Exercises: tandem stance 1x 30" each foot in front, tandem stance with R on 4in step 3x 30" Other Standing Knee Exercises: Posture cueing x 1' to decrease trunk flexion prior gait;  hip extension x 10 Seated Long Arc Quad: Left;Limitations Long Arc Quad Limitations: 25 Supine Bridges: 10 reps;Limitations Bridges Limitations: ball between knees Sidelying Hip ABduction: Left;Limitations Hip ADduction: Left;10 reps Clams: Left; 10 reps   Modalities Modalities: Cryotherapy Cryotherapy Number Minutes Cryotherapy: 10 Minutes Cryotherapy Location: Knee Type of Cryotherapy: Ice pack  Physical Therapy Assessment and Plan PT Assessment and Plan Clinical Impression Statement: Gait training to imporove mechanics with QC for heel to toe pattern and equalized stride length to improved gait mechanic and reduce antalgic gait, pt required cueing for posture to reduce trunk flexion. Began  therex for LE strengthenig, pt able to demonstrate appropriate techniques with min cueing for proper positioning for maximum benefits. PT Plan: Iontophoresis and TENS unit requested from MD if needed.  Focus on improving LE strength and confidence with balance to progress towards independent gait to reach functional goals.     Goals    Problem List Patient Active Problem List  Diagnosis  . DIABETES MELLITUS, WITHOUT COMPLICATIONS  . HYPERLIPIDEMIA  . OBESITY, MORBID  . HYPERTENSION  . GERD  . ARTHRITIS  . NASH (nonalcoholic steatohepatitis)  . Knee pain, bilateral  . Leg pain  . Patellar tendonitis  . Leg weakness  . Back pain  . Meniscus tear  . Osteoarthritis of left knee  . S/P arthroscopy of left knee       GP    CAldona Lento2/20/2014, 1:01 PM

## 2012-11-23 ENCOUNTER — Ambulatory Visit (HOSPITAL_COMMUNITY)
Admission: RE | Admit: 2012-11-23 | Discharge: 2012-11-23 | Disposition: A | Discharge status: Home / Self Care | Payer: Medicare Other | Source: Ambulatory Visit | Attending: Orthopedic Surgery | Admitting: Orthopedic Surgery

## 2012-11-23 NOTE — Progress Notes (Signed)
Physical Therapy Treatment Patient Details  Name: MESHILEM MACHUCA MRN: [id number] Date of Birth: 1946/07/14  Today's Date: 11/23/2012 Time: 3785-8850 PT Time Calculation (min): 44 min Charges: 8' [place], 12' Manual, 24' TE Visit#: 3 of 9  Re-eval: 11/30/12    Authorization: MEDICARE  Authorization Time Period:    Authorization Visit#: 3 of 10   Subjective: Symptoms/Limitations Symptoms: Pt reports that he is really sore this weekned.  He has been doing his exercises.  He states that he went to the flea market for 4 hours and only sat about 1x.  He thinks he may have stood on it too long.  Pain Assessment Pain Score:   6 Pain Location: Knee Pain Orientation: Left  Precautions/Restrictions     Exercise/Treatments Stretches Gastroc Stretch: 3 reps;30 seconds Soleus Stretch: 3 reps;60 seconds Standing Other Standing Knee Exercises: tandem gait 1 RT, retro gait 1/2 trip increased popping to knee; heel and toe walking 1 RT Supine Bridges: 10 reps;Limitations Bridges Limitations: 5 sec holds Straight Leg Raises: AAROM;Left;10 reps;Limitations Straight Leg Raises Limitations: iinstruction for isometric holds Other Supine Knee Exercises: L LE marching x15 Sidelying Hip ABduction: Left;15 reps Prone  Hamstring Curl: 10 reps;Limitations Hamstring Curl Limitations: 3# Hip Extension: Left;10 reps;Limitations Hip Extension Limitations: tactile guiding Other Prone Exercises: Hip IR x15 tactile guiding Other Prone Exercises: TKE 10x tactile guding   Modalities Modalities: Ultrasound Manual Therapy Manual Therapy: Myofascial release Myofascial Release: supine w/legs elevated to distal L quadricep to decrease fascial restrictions w/STM after x12 minutes Ultrasound Ultrasound Location: distal quadricep (VMO and ITB region) Ultrasound Parameters: 8 minutes, continous, 1.0 w/cm2 3 mhz (medium head)  Physical Therapy Assessment and Plan PT Assessment and Plan Clinical Impression  Statement: Added balance exercises and prone activities to improve hip strength. Pt has significant atrophy to proximal hip flexor region and demonstrates increased pain with supine SLR secondary to increased activation of distal quadricep due to weakness to proximal quadricep and psoas.  Added [place] at end of session to improve mobility and decrease scar tissue.  PT Plan: Iontophoresis and TENS unit requested from MD if needed.  Continue to improve hip flexor strengthening (standing marching, seated isometric marching).    Goals    Problem List Patient Active Problem List  Diagnosis  . DIABETES MELLITUS, WITHOUT COMPLICATIONS  . HYPERLIPIDEMIA  . OBESITY, MORBID  . HYPERTENSION  . GERD  . ARTHRITIS  . NASH (nonalcoholic steatohepatitis)  . Knee pain, bilateral  . Leg pain  . Patellar tendonitis  . Leg weakness  . Back pain  . Meniscus tear  . Osteoarthritis of left knee  . S/P arthroscopy of left knee   Gracianna Vink. PT 11/23/2012, 9:27 AM

## 2012-11-25 ENCOUNTER — Ambulatory Visit (HOSPITAL_COMMUNITY)
Admission: RE | Admit: 2012-11-25 | Discharge: 2012-11-25 | Disposition: A | Discharge status: Home / Self Care | Payer: Medicare Other | Source: Ambulatory Visit | Attending: Orthopedic Surgery | Admitting: Orthopedic Surgery

## 2012-11-25 NOTE — Progress Notes (Signed)
Physical Therapy Treatment Patient Details  Name: Michael [name] MRN: [id number] Date of Birth: [birth year]  Today's Date: 11/25/2012 Time: 5852-7782 PT Time Calculation (min): 43 min Charges: 35' TE, 8' [place] Visit#: 4 of 9  Re-eval: 11/30/12    Authorization: MEDICARE  Authorization Time Period:    Authorization Visit#: 4 of 10   Subjective: Symptoms/Limitations Symptoms: states the [place] helped a lot.  He has been doing a lot of stair training and his exercises at home. Pain Assessment Pain Score:   5 Pain Location: Knee Pain Orientation: Left  Exercise/Treatments Standing Heel Raises: 15 reps (no HHA) Heel Raises Limitations: Toe raises 15 reps Knee Flexion: Left;15 reps Knee Flexion Limitations: 2# Lateral Step Up: Left;10 reps;Hand Hold: 0;Step Height: 2" Forward Step Up: Left;10 reps;Hand Hold: 0;Step Height: 2" Step Down: Left;10 reps Rocker Board: 2 minutes (S<>S) Other Standing Knee Exercises: tandem gait 1 RT; heel and toe walking 1 RT Other Standing Knee Exercises: 6 in. hurdles R foot leading 1 RT Sidelying Hip ABduction: Left;20 reps Clams: Left; 5 reos 10 sec holds Prone  Hip Extension: Left;10 reps Hip Extension Limitations: tactile guiding Other Prone Exercises: Hip IR x15 tactile guiding; heel squeeze 10x5 sec holds tactile guiding Other Prone Exercises: TKE 10x tactile guding 5 sec holds   Modalities Modalities: Ultrasound Ultrasound Ultrasound Location: distal lateral quadricep Ultrasound Parameters: 8 minutes, continoous, 1.2 w/cm2, 3 mhz (medium head) Ultrasound Goals: Pain  Physical Therapy Assessment and Plan PT Assessment and Plan Clinical Impression Statement: Continues to improve his LE AROM and knee strength, however continues to have greatest difficulty with hip ROM and strength.  Encouraged pt to be aware of using his R hip while walking and w/stairs and encouraged to continue with 4 way SLR.   PT Plan: Iontophoresis and TENS unit  requested from MD if needed.  Continue to improve hip flexor strengthening (standing marching, seated isometric marching).    Goals    Problem List Patient Active Problem List  Diagnosis  . DIABETES MELLITUS, WITHOUT COMPLICATIONS  . HYPERLIPIDEMIA  . OBESITY, MORBID  . HYPERTENSION  . GERD  . ARTHRITIS  . NASH (nonalcoholic steatohepatitis)  . Knee pain, bilateral  . Leg pain  . Patellar tendonitis  . Leg weakness  . Back pain  . Meniscus tear  . Osteoarthritis of left knee  . S/P arthroscopy of left knee       GP    Ysabel Cowgill 11/25/2012, 9:32 AM

## 2012-11-27 ENCOUNTER — Ambulatory Visit (HOSPITAL_COMMUNITY)
Admission: RE | Admit: 2012-11-27 | Discharge: 2012-11-27 | Disposition: A | Discharge status: Home / Self Care | Payer: Medicare Other | Source: Ambulatory Visit | Attending: Orthopedic Surgery | Admitting: Orthopedic Surgery

## 2012-11-27 NOTE — Progress Notes (Signed)
Physical Therapy Treatment Patient Details  Name: Michael [name] MRN: [id number] Date of Birth: April 01, 1946  Today's Date: 11/27/2012 Time: 2355-7322 PT Time Calculation (min): 54 min Charges: 8' [place], 10' Manual, 36' TE Visit#: 5 of 9  Re-eval: 11/30/12    Authorization: MEDICARE  Authorization Time Period:    Authorization Visit#: 5 of 10   Subjective: Symptoms/Limitations Symptoms: Pt reports that he is working a lot on his HEP.  He states that he remains about a 5/10 to his knee.  Pain Assessment Pain Score:   5  Precautions/Restrictions     Exercise/Treatments Standing Lateral Step Up: Left;15 reps;Hand Hold: 2;Step Height: 2";Limitations Lateral Step Up Limitations: attempted hip hikes x5 w/audible popping Forward Step Up: Left;15 reps;Hand Hold: 1;Step Height: 2" Step Down: Left;10 reps;Hand Hold: 1;Step Height: 2" Functional Squat: 15 reps;5 seconds Other Standing Knee Exercises: Hip abduction 10x5 sec holds Other Standing Knee Exercises: 6 in. hurdles R foot leading 1 RT, audbile popping Supine Bridges: Both;15 reps Bridges Limitations: 5 sec holds Straight Leg Raises: Left;2 sets;Limitations Straight Leg Raises Limitations: 7 reps Sidelying Hip ABduction: Left;20 reps Clams: Left; 10 reps 5-10 sec holds Other Sidelying Knee Exercises: Hip IR 5x5 sec holds Prone  Hamstring Curl: 15 reps Hamstring Curl Limitations: 4# Other Prone Exercises: Hip IR x15 tactile guiding   Modalities Modalities: Ultrasound Manual Therapy Manual Therapy: Myofascial release Myofascial Release: supine w/legs elevated to distal quadricep to decrease fascial restrictions w/CTR  Ultrasound Ultrasound Location: distal lateral quadriceps  Ultrasound Parameters: 8 minutes continous 1.2 w/cm2 3 mHz (medium head) Ultrasound Goals: Pain  Physical Therapy Assessment and Plan PT Assessment and Plan Clinical Impression Statement: Pt continues to gait overall strength in his L LE and  focused on improving L hip strength to decrease trendelenberg gait mechanics.  Continues to have significant audible popping throughout L knee when in complete extension and fully weighted position (w/hurdles, hip hikes and SLS). Left hip IR: 8 degrees, R hip IR 28 degrees.  L knee AROM: 0-115 PT Plan: Re-eval next visit prior to MD apt.  Iontophoresis and TENS unit requested from MD if needed.  Continue to improve hip flexor strengthening (standing marching, seated isometric marching).    Goals Home Exercise Program Pt will Perform Home Exercise Program: Independently PT Goal: Perform Home Exercise Program - Progress: Met PT Short Term Goals Time to Complete Short Term Goals: 3 weeks PT Short Term Goal 1: PT will report pain less than 3/10 for 75% of his day for improved QOL>  PT Short Term Goal 1 - Progress: Progressing toward goal PT Short Term Goal 2: Pt will improve LE strength to [facility] in order to ambulate independently x30 minutes to continue comfortably with community ambulation.  PT Short Term Goal 2 - Progress: Progressing toward goal PT Short Term Goal 3: Pt will improve dynamic balance in order to ambulate independently in outdoor environment.  PT Short Term Goal 3 - Progress: Progressing toward goal PT Short Term Goal 4: Pt will improve his ABC to greater than 40% for improved percieved functional ability.  PT Short Term Goal 4 - Progress: Progressing toward goal PT Short Term Goal 5: Pt will improve L knee AROM to [facility] in order to go from STS w/o UE A.  PT Short Term Goal 5 - Progress: Progressing toward goal  Problem List Patient Active Problem List  Diagnosis  . DIABETES MELLITUS, WITHOUT COMPLICATIONS  . HYPERLIPIDEMIA  . OBESITY, MORBID  . HYPERTENSION  . GERD  .  ARTHRITIS  . NASH (nonalcoholic steatohepatitis)  . Knee pain, bilateral  . Leg pain  . Patellar tendonitis  . Leg weakness  . Back pain  . Meniscus tear  . Osteoarthritis of left knee  . S/P arthroscopy  of left knee   Laymon Stockert, PT 11/27/2012, 9:46 AM

## 2012-11-30 ENCOUNTER — Ambulatory Visit (HOSPITAL_COMMUNITY)
Admission: RE | Admit: 2012-11-30 | Discharge: 2012-11-30 | Disposition: A | Discharge status: Home / Self Care | Payer: Medicare Other | Source: Ambulatory Visit | Attending: Orthopedic Surgery | Admitting: Orthopedic Surgery

## 2012-11-30 DIAGNOSIS — M6281 Muscle weakness (generalized): Secondary | ICD-10-CM | POA: Insufficient documentation

## 2012-11-30 DIAGNOSIS — E119 Type 2 diabetes mellitus without complications: Secondary | ICD-10-CM | POA: Insufficient documentation

## 2012-11-30 DIAGNOSIS — IMO0001 Reserved for inherently not codable concepts without codable children: Secondary | ICD-10-CM | POA: Insufficient documentation

## 2012-11-30 DIAGNOSIS — I10 Essential (primary) hypertension: Secondary | ICD-10-CM | POA: Insufficient documentation

## 2012-11-30 DIAGNOSIS — M25569 Pain in unspecified knee: Secondary | ICD-10-CM | POA: Insufficient documentation

## 2012-11-30 NOTE — Progress Notes (Addendum)
Physical Therapy Re-evaluation/treatment  Patient Details  Name: Michael [name] MRN: [id number] Date of Birth: Oct 29, 1945  Today's Date: 11/30/2012 Time: 0800-0850 PT Time Calculation (min): 50 min Charge : MMT x 1, ROM Measurement x 1, [place] x 8', therex 23', self care (ABC scale, self care discussion) x 8 Visit#: 6 of 9  Re-eval: 12/28/12 Authorization: MEDICARE  Authorization Time Period:    Authorization Visit#: 6 of 16   Diagnosis: L knee SALK procedure Surgical Date: 11/06/12 Next MD Visit: Dr. Aline Brochure - November 30 2012 Prior Therapy: Conservative treatment  Subjective Symptoms/Limitations Symptoms: Pt reported pain scale 5/10 L knee believes the weather has to do with increased pain this morning.  Pr stated he thinks overall he has improved 80% overall. How long can you sit comfortably?: Can sit comfortably through church 45 minutes to an hour How long can you stand comfortably?: Able to stand comfortably for 30 minutes How long can you walk comfortably?: Abke to walk comfortably for 15 minutes, able to walk to mail box, through grocery store or mall with AD. Pain Assessment Currently in Pain?: Yes Pain Score:   5 Pain Location: Knee Pain Orientation: Left  Objective:  Sensation/Coordination/Flexibility/Functional Tests Functional Tests Functional Tests: ABC scale 62.81% (was ABC 24%) Functional Tests: 30 sec 8 STS w/o HHA  LLE AROM (degrees) Left Knee Extension: 0 (0) Left Knee Flexion: 105 (100) LLE PROM (degrees) Left Knee Extension: 0 (0) Left Knee Flexion: 108 (105)  LLE Strength Left Hip Flexion: 3/5 (was 3/5) Left Hip Extension: 2+/5 (was 2-/5) Left Hip ABduction: 4/5 (was 3+/5) Left Hip ADduction: 2+/5 (was 2/5) Left Knee Flexion: 3+/5 (was 3/5) Left Knee Extension:  (3+/5 was 3/5)  Exercise/Treatments Standing Other Standing Knee Exercises: hip march hold 5x 10" Seated Other Seated Knee Exercises: seated march 5x 10" with cueing for  posture Supine Other Supine Knee Exercises: isometric hip flexion 5x 10"  Modalities Modalities: Ultrasound Ultrasound Ultrasound Location: distal lateral quadriceps Ultrasound Parameters: 8 minutes continuous 65mz 1.2 w/cm2 (medium size [place]) Ultrasound Goals: Pain  Physical Therapy Assessment and Plan PT Assessment and Plan Clinical Impression Statement: Re-eval complete prior MD apt tomorrow, Mr. GFawcetthas had 6 OPPT sessions over 2 weeks with the following findings:  pt is independent with HEP and compliant 3x daily, pt reported pain average 4-5/10 for 75% of day, improved overall strength though continues to have weak hip and knee musculature, pt able to demonstrate 8 STS with no HHA in 30 seconda and has improved perceived functional ability based on ABC scale, pt continues to ambulate with AD but able to sit, stand and walk comfortably for longer periods of time.  AROM continues to improve 0-105, PROM 108 knee flexion. Frequency: 2x/week Duration: 3 weeks PT Plan: Recommend continuing OPPT for 2 more weeks to improve strength, balance, AROM and reduce pain. Begin iontophoresis next session for pain relief.      Goals Home Exercise Program: Independently: Met (3x a day) PT Short Term Goals: 3 weeks PT Short Term Goal 1: PT will report pain less than 3/10 for 75% of his day for improved QOL> : Progressing toward goal (pain range from 4-7/10 average 5/10) PT Short Term Goal 2: Pt will improve LE strength to [facility] order to ambulate independently x30 minutes to continue comfortably with community ambulation. : Progressing toward goal PT Short Term Goal 3: Pt will improve dynamic balance in order to ambulate independently in outdoor environment.: Progressing toward goal PT Short Term Goal  4: Pt will improve his ABC to greater than 40% for improved percieved functional ability. Met (ABC score 62.81%) PT Short Term Goal 5: Pt will improve L knee AROM to [facility] in order to go from STS w/o  UE A. : Progressing toward goal (Able to complete 8 STS w/o HHA in 30"; AROM 0-105)  Problem List Patient Active Problem List  Diagnosis  . DIABETES MELLITUS, WITHOUT COMPLICATIONS  . HYPERLIPIDEMIA  . OBESITY, MORBID  . HYPERTENSION  . GERD  . ARTHRITIS  . NASH (nonalcoholic steatohepatitis)  . Knee pain, bilateral  . Leg pain  . Patellar tendonitis  . Leg weakness  . Back pain  . Meniscus tear  . Osteoarthritis of left knee  . S/P arthroscopy of left knee    PT - End of Session Activity Tolerance: Patient tolerated treatment well General Behavior During Session: [facility] for tasks performed Cognition: [facility] for tasks performed  GP Functional Assessment Tool Used: ABC Functional Limitation: Mobility: Walking and moving around Mobility: Walking and Moving Around Current Status ([id number]): At least 20 percent but less than 40 percent impaired, limited or restricted Mobility: Walking and Moving Around Goal Status [phone]): At least 1 percent but less than 20 percent impaired, limited or restricted  Aldona Lento 11/30/2012, 10:09 AM

## 2012-12-01 ENCOUNTER — Ambulatory Visit: Payer: Medicare Other | Admitting: Orthopedic Surgery

## 2012-12-02 ENCOUNTER — Ambulatory Visit (HOSPITAL_COMMUNITY)
Admission: RE | Admit: 2012-12-02 | Discharge: 2012-12-02 | Disposition: A | Discharge status: Home / Self Care | Payer: Medicare Other | Source: Ambulatory Visit | Attending: Orthopedic Surgery | Admitting: Orthopedic Surgery

## 2012-12-02 ENCOUNTER — Ambulatory Visit (INDEPENDENT_AMBULATORY_CARE_PROVIDER_SITE_OTHER): Payer: Medicare Other | Admitting: Orthopedic Surgery

## 2012-12-02 VITALS — BP 140/60 | Ht 63.0 in | Wt 211.0 lb

## 2012-12-02 DIAGNOSIS — M1712 Unilateral primary osteoarthritis, left knee: Secondary | ICD-10-CM

## 2012-12-02 DIAGNOSIS — Z9889 Other specified postprocedural states: Secondary | ICD-10-CM

## 2012-12-02 DIAGNOSIS — S83207D Unspecified tear of unspecified meniscus, current injury, left knee, subsequent encounter: Secondary | ICD-10-CM

## 2012-12-02 DIAGNOSIS — M1710 Unilateral primary osteoarthritis, unspecified knee: Secondary | ICD-10-CM

## 2012-12-02 DIAGNOSIS — Z5189 Encounter for other specified aftercare: Secondary | ICD-10-CM

## 2012-12-02 MED ORDER — HYDROCODONE-ACETAMINOPHEN 5-325 MG PO TABS: 1.0000 | ORAL_TABLET | ORAL | Status: DC | PRN

## 2012-12-02 NOTE — Progress Notes (Signed)
Physical Therapy Treatment Patient Details  Name: Michael [name] MRN: [id number] Date of Birth: 06/28/1946  Today's Date: 12/02/2012 Time: 0800-0900 PT Time Calculation (min): 60 min  Visit#: 7 of 12  Re-eval: 12/28/12 Authorization: MEDICARE  Authorization Visit#: 7 of 16  Charges:  therex 40', ionto X 16'  Subjective: Symptoms/Limitations Symptoms: Pt. states his pain is 5/10 today, mostly superior L knee.   Exercise/Treatments Standing Lateral Step Up: 15 reps;Step Height: 2";Hand Hold: 0;Left Forward Step Up: 15 reps;Step Height: 4";Hand Hold: 0;Left Step Down: 15 reps;Step Height: 2";Hand Hold: 1;Left Functional Squat: 15 reps Other Standing Knee Exercises: hip march hold 10x 5" Supine Bridges: Both;15 reps Straight Leg Raises: Left;2 sets;Limitations Straight Leg Raises Limitations: 7 reps Other Supine Knee Exercises: isometric hip flexion 10x 5" Sidelying Hip ABduction: Left;20 reps Clams: Left; 10 reps 5-10 sec holds Prone  Hamstring Curl: 15 reps Hamstring Curl Limitations: 4#   Modalities Modalities: Iontophoresis Iontophoresis Type of Iontophoresis: Dexamethasone Location: L superior knee Dose: 2cc (59m) Time: 12  Physical Therapy Assessment and Plan PT Assessment and Plan Clinical Impression Statement: Pt. returns to MD today (r/s due to inclement weather).  L IR continues to be very tight, requiring tactile cues to complete correctly.  Began iontophoresis with 2cc dexamethasone to help reduce pain in L knee. PT Plan: Continue PT for improved strength, balance, AROM and reduce pain. Continue iontophoresis X 5 more sessions.      Problem List Patient Active Problem List  Diagnosis  . DIABETES MELLITUS, WITHOUT COMPLICATIONS  . HYPERLIPIDEMIA  . OBESITY, MORBID  . HYPERTENSION  . GERD  . ARTHRITIS  . NASH (nonalcoholic steatohepatitis)  . Knee pain, bilateral  . Leg pain  . Patellar tendonitis  . Leg weakness  . Back pain  . Meniscus tear   . Osteoarthritis of left knee  . S/P arthroscopy of left knee    PT - End of Session Activity Tolerance: Patient tolerated treatment well General Behavior During Session: [facility] Cognition: [facility]for tasks performed    12/02/2012, 9:15 AM ATeena Irani PTA/CLT

## 2012-12-02 NOTE — Progress Notes (Signed)
Patient ID: Michael [name], male   DOB: 12-02-1945, 67 y.o.   MRN: [id number] Chief Complaint  Patient presents with  . Follow-up    Post op #2 SALK DOS 11/06/12    Status post arthroscopy left knee partial medial meniscectomy the patient had some mild chondromalacia grade 2 of his patellofemoral joint.   The patient reports some popping in his knee but overall the swelling is going down he has some mild balance issues which are chronic. Therapy is working on that. He has good flexion good extension good straight leg raise  Recommend followup in a month continue therapy until it runs out. Medication Norco 5 mg for pain.

## 2012-12-04 ENCOUNTER — Ambulatory Visit (HOSPITAL_COMMUNITY): Payer: Medicare Other

## 2012-12-07 ENCOUNTER — Ambulatory Visit (HOSPITAL_COMMUNITY)
Admission: RE | Admit: 2012-12-07 | Discharge: 2012-12-07 | Disposition: A | Discharge status: Home / Self Care | Payer: Medicare Other | Source: Ambulatory Visit | Attending: Orthopedic Surgery | Admitting: Orthopedic Surgery

## 2012-12-07 NOTE — Progress Notes (Signed)
Physical Therapy Treatment Patient Details  Name: Michael [name] MRN: [id number] Date of Birth: [birth year]  Today's Date: 12/07/2012 Time: 6004-5997 PT Time Calculation (min): 50 min  Visit#: 8 of 12  Re-eval: 12/28/12 Charges: Therex x 30' Manual x 8' Ice x 10'  Authorization: MEDICARE  Authorization Visit#: 8 of 16   Subjective: Symptoms/Limitations Symptoms: Pt states that he had a reaction to the iontophoresis. Pain Assessment Currently in Pain?: Yes Pain Score:   5 Pain Location: Knee Pain Orientation: Left   Exercise/Treatments Machines for Strengthening Cybex Knee Extension: 1pl x 10 BLE Cybex Knee Flexion: 2pl x 10 BLE Standing Lateral Step Up: 15 reps;Left;Step Height: 4";Hand Hold: 2 Forward Step Up: 15 reps;Step Height: 4";Hand Hold: 0;Left Step Down: 15 reps;Hand Hold: 1;Left;Step Height: 4" Functional Squat: 15 reps Other Standing Knee Exercises: hip march hold 10x 5"   Manual Therapy Manual Therapy: Myofascial release Myofascial Release: MFR to L distal quad with knee in flexion. Cryotherapy Number Minutes Cryotherapy: 10 Minutes Cryotherapy Location: Knee (Left) Type of Cryotherapy: Ice pack  Physical Therapy Assessment and Plan PT Assessment and Plan Clinical Impression Statement: Pt tolerates increased step height with step ups with minimal difficulty. Began cybex machines for quad and ham strength. Pt display significant lack of strength in both mm groups. Completed quad strengthening in supine and encouraged pt to complete TKE and quad sets at home. MFR completed to distal quad to decrease pain, spasms and adhesions. Ice applied to L knee at end of session to limit pain and inflammation. Iontophoresis no completed secondary to reaction after last tx. PT Plan: Continue to improve strength, balance, AROM and reduce pain.     Problem List Patient Active Problem List  Diagnosis  . DIABETES MELLITUS, WITHOUT COMPLICATIONS  . HYPERLIPIDEMIA  .  OBESITY, MORBID  . HYPERTENSION  . GERD  . ARTHRITIS  . NASH (nonalcoholic steatohepatitis)  . Knee pain, bilateral  . Leg pain  . Patellar tendonitis  . Leg weakness  . Back pain  . Meniscus tear  . Osteoarthritis of left knee  . S/P arthroscopy of left knee    PT - End of Session Activity Tolerance: Patient tolerated treatment well General Behavior During Session: [facility] for tasks performed Cognition: [facility] for tasks performed  Rachelle Hora, PTA 12/07/2012, 9:42 AM

## 2012-12-09 ENCOUNTER — Ambulatory Visit (HOSPITAL_COMMUNITY): Payer: Medicare Other

## 2012-12-10 ENCOUNTER — Ambulatory Visit (HOSPITAL_COMMUNITY)
Admission: RE | Admit: 2012-12-10 | Discharge: 2012-12-10 | Disposition: A | Discharge status: Home / Self Care | Payer: Medicare Other | Source: Ambulatory Visit | Attending: Orthopedic Surgery | Admitting: Orthopedic Surgery

## 2012-12-10 NOTE — Progress Notes (Signed)
Physical Therapy Treatment Patient Details  Name: Michael [name] MRN: [id number] Date of Birth: [birth year]  Today's Date: 12/10/2012 Time: 0801-0850 PT Time Calculation (min): 49 min Charge : therex 30', manual 8'. ice 10'  Visit#: 9 of 12  Re-eval: 12/28/12 Assessment Diagnosis: L knee SALK procedure Surgical Date: 11/06/12 Next MD Visit: Dr. Aline Brochure- April Prior Therapy: Conservative treatment  Authorization: MEDICARE  Authorization Time Period:    Authorization Visit#: 9 of 16   Subjective: Symptoms/Limitations Symptoms: Pt reported pain posterior knee to hip, pain scale 5/10 Pain Assessment Currently in Pain?: Yes Pain Score:   5 Pain Location: Knee Pain Orientation: Left;Posterior;Anterior  Objective :   Exercise/Treatments Stretches Passive Hamstring Stretch: 3 reps;60 seconds Machines for Strengthening Cybex Knee Extension: 1pl x 10 BLE Cybex Knee Flexion: 2.5pl x 10 BLE Standing Lateral Step Up: 15 reps;Left;Step Height: 4";Hand Hold: 2 Forward Step Up: 15 reps;Step Height: 4";Hand Hold: 0;Left Step Down: 15 reps;Hand Hold: 1;Left;Step Height: 4" Functional Squat: 15 reps Rocker Board: 2 minutes (R/L intermittent HHA) Gait Training: Gait training 40' with QC with cueing for equalized gait mechanics and posture x 74mn Other Standing Knee Exercises: hip march hold 3x 5"   Modalities Modalities: Cryotherapy Manual Therapy Manual Therapy: Myofascial release Joint Mobilization: patella mobs all directions and grade I-III tib/fib for ROM, decrease adhesions and pain. Myofascial Release: MFR to L distal quad with knee in flexion Cryotherapy Number Minutes Cryotherapy: 10 Minutes Cryotherapy Location: Knee Type of Cryotherapy: Ice pack  Physical Therapy Assessment and Plan PT Assessment and Plan Clinical Impression Statement: Pt with increase antalgic gait pattern initially this session, resumed rockerboard to equalize/increase weight distribution left  LE.and gait training with cueing for proper sequencing with QC and posture to reduce limping.  Audible crepitus noted with stair training this session.  Passive stretches complete for improved hamstring flexibility and pain relief following ice.   PT Plan: Continue to improve strength, balance, AROM and reduce pain.    Goals    Problem List Patient Active Problem List  Diagnosis  . DIABETES MELLITUS, WITHOUT COMPLICATIONS  . HYPERLIPIDEMIA  . OBESITY, MORBID  . HYPERTENSION  . GERD  . ARTHRITIS  . NASH (nonalcoholic steatohepatitis)  . Knee pain, bilateral  . Leg pain  . Patellar tendonitis  . Leg weakness  . Back pain  . Meniscus tear  . Osteoarthritis of left knee  . S/P arthroscopy of left knee    PT - End of Session Activity Tolerance: Patient tolerated treatment well;Patient limited by pain General Behavior During Session: [facility] performed Cognition: [facility]  GP    CAldona Lento3/13/2014, 9:55 AM

## 2012-12-11 ENCOUNTER — Ambulatory Visit (HOSPITAL_COMMUNITY): Payer: Medicare Other | Admitting: Physical Therapy

## 2012-12-14 ENCOUNTER — Ambulatory Visit (HOSPITAL_COMMUNITY): Payer: Medicare Other | Admitting: Physical Therapy

## 2012-12-16 ENCOUNTER — Ambulatory Visit (HOSPITAL_COMMUNITY)
Admission: RE | Admit: 2012-12-16 | Discharge: 2012-12-16 | Disposition: A | Discharge status: Home / Self Care | Payer: Medicare Other | Source: Ambulatory Visit | Attending: Orthopedic Surgery | Admitting: Orthopedic Surgery

## 2012-12-16 NOTE — Progress Notes (Signed)
Physical Therapy Treatment Patient Details  Name: Michael [name] MRN: [id number] Date of Birth: 1945/10/13  Today's Date: 12/16/2012 Time: 0803-0858 PT Time Calculation (min): 55 min  Visit#: 10 of 12  Re-eval: 12/28/12 Authorization: MEDICARE  Authorization Visit#: 40 of 16  Charges:  therex 35'  , manual 8', ice 10'  Subjective:  Pt. States his pain remains 3/10 in the same spot at distal left quad.   Exercise/Treatments Machines for Strengthening Cybex Knee Extension: 1pl  2x10 BLE Cybex Knee Flexion: 2.5pl  2x10 BLE Standing Heel Raises: 15 reps;Limitations Heel Raises Limitations: Toe raises 15 reps Lateral Step Up: 15 reps;Left;Step Height: 4";Hand Hold: 2 Forward Step Up: 15 reps;Step Height: 4";Hand Hold: 0;Left Step Down: 15 reps;Hand Hold: 1;Left;Step Height: 4" Functional Squat: 15 reps Rocker Board: 2 minutes Other Standing Knee Exercises: hip march hold 5x 5"   Modalities Modalities: Cryotherapy Manual Therapy Manual Therapy: Myofascial release Myofascial Release: MFR to Lt distal quad with knee in flexion  Cryotherapy Number Minutes Cryotherapy: 10 Minutes Cryotherapy Location: Knee Type of Cryotherapy: Ice pack  Physical Therapy Assessment and Plan PT Assessment and Plan Clinical Impression Statement: Pt with improving gait since working with therapist last visit.  Continues to have pinpoint pain distal quad with related spasm that remains 3-5/10 with audible crepitus with flexion to extension transitioning.  Able to add one set of reps to cybex strengthening exercises. PT Plan: Continue to improve strength, balance, AROM and reduce pain.  Re-eval X 2 visits.     Problem List Patient Active Problem List  Diagnosis  . DIABETES MELLITUS, WITHOUT COMPLICATIONS  . HYPERLIPIDEMIA  . OBESITY, MORBID  . HYPERTENSION  . GERD  . ARTHRITIS  . NASH (nonalcoholic steatohepatitis)  . Knee pain, bilateral  . Leg pain  . Patellar tendonitis  . Leg weakness   . Back pain  . Meniscus tear  . Osteoarthritis of left knee  . S/P arthroscopy of left knee    PT - End of Session Activity Tolerance: Patient tolerated treatment well;Patient limited by pain General Behavior During Session: [facility] for tasks performed Cognition: [facility] for tasks performed  GP Functional Assessment Tool Used: ABC; updated at visit [place], PTA/CLT 12/16/2012, 9:50 AM

## 2012-12-22 ENCOUNTER — Inpatient Hospital Stay (HOSPITAL_COMMUNITY): Admission: RE | Admit: 2012-12-22 | Payer: Medicare Other | Source: Ambulatory Visit

## 2012-12-24 ENCOUNTER — Ambulatory Visit (HOSPITAL_COMMUNITY)
Admission: RE | Admit: 2012-12-24 | Discharge: 2012-12-24 | Disposition: A | Discharge status: Home / Self Care | Payer: Medicare Other | Source: Ambulatory Visit | Attending: Orthopedic Surgery | Admitting: Orthopedic Surgery

## 2012-12-24 NOTE — Progress Notes (Signed)
Physical Therapy Treatment Patient Details  Name: Michael [name] MRN: [id number] Date of Birth: 1946/02/27  Today's Date: 12/24/2012 Time: 0800-0856 PT Time Calculation (min): 56 min  Visit#: 11 of 12  Re-eval: 12/28/12 Authorization: MEDICARE  Authorization Visit#: 11 of 12  Charges:  therex 40' , icepack 10'  Subjective: Symptoms/Limitations Symptoms: Pt. states his pain is 3/10 in the top of his knee, on the knee cap, especially when he walks.  States it feels like it's torn when he walks.  Pt also reports it pops and rubs when he walks. Pain Assessment Currently in Pain?: Yes Pain Score:   3 Pain Location: Knee Pain Orientation: Left;Posterior;Anterior   Exercise/Treatments Machines for Strengthening Cybex Knee Extension: 1pl  2x15 BLE Cybex Knee Flexion: 2.5pl  2x15 BLE Standing Heel Raises: 15 reps;Limitations Heel Raises Limitations: Toe raises 15 reps Lateral Step Up: 15 reps;Left;Step Height: 4";Hand Hold: 2 Forward Step Up: 15 reps;Step Height: 4";Hand Hold: 0;Left Step Down: 15 reps;Hand Hold: 1;Left;Step Height: 4" Functional Squat: 15 reps Rocker Board: 2 minutes Other Standing Knee Exercises: hip march hold 5x 5"   Modalities Modalities: Cryotherapy Manual Therapy Manual Therapy: Myofascial release Myofascial Release: MFR to Lt distal quad with knee in flexion  Cryotherapy Number Minutes Cryotherapy: 10 Minutes Cryotherapy Location: Knee Type of Cryotherapy: Ice pack  Physical Therapy Assessment and Plan PT Assessment and Plan Clinical Impression Statement: Pt continues to have pinpoint pain distal quad (superior patella) audible crepitus with flexion to extension transitioning and weight bearing.  Pt continues to have most difficulty with  step ups due to this.  Pt. is due for re-evaluation next visit. PT Plan: Re-eval next visit.     Problem List Patient Active Problem List  Diagnosis  . DIABETES MELLITUS, WITHOUT COMPLICATIONS  .  HYPERLIPIDEMIA  . OBESITY, MORBID  . HYPERTENSION  . GERD  . ARTHRITIS  . NASH (nonalcoholic steatohepatitis)  . Knee pain, bilateral  . Leg pain  . Patellar tendonitis  . Leg weakness  . Back pain  . Meniscus tear  . Osteoarthritis of left knee  . S/P arthroscopy of left knee    PT - End of Session Activity Tolerance: Patient tolerated treatment well;Patient limited by pain General Behavior During Session: [facility] for tasks performed Cognition: [facility] for tasks performed   Teena Irani, PTA/CLT 12/24/2012, 8:57 AM

## 2012-12-29 ENCOUNTER — Ambulatory Visit (HOSPITAL_COMMUNITY): Payer: Medicare Other | Admitting: Physical Therapy

## 2012-12-29 ENCOUNTER — Ambulatory Visit (HOSPITAL_COMMUNITY)
Admission: RE | Admit: 2012-12-29 | Discharge: 2012-12-29 | Disposition: A | Discharge status: Home / Self Care | Payer: Medicare Other | Source: Ambulatory Visit | Attending: Orthopedic Surgery | Admitting: Orthopedic Surgery

## 2012-12-29 DIAGNOSIS — M25569 Pain in unspecified knee: Secondary | ICD-10-CM | POA: Insufficient documentation

## 2012-12-29 DIAGNOSIS — I10 Essential (primary) hypertension: Secondary | ICD-10-CM | POA: Insufficient documentation

## 2012-12-29 DIAGNOSIS — M6281 Muscle weakness (generalized): Secondary | ICD-10-CM | POA: Insufficient documentation

## 2012-12-29 DIAGNOSIS — IMO0001 Reserved for inherently not codable concepts without codable children: Secondary | ICD-10-CM | POA: Insufficient documentation

## 2012-12-29 DIAGNOSIS — E119 Type 2 diabetes mellitus without complications: Secondary | ICD-10-CM | POA: Insufficient documentation

## 2012-12-29 LAB — LIPID PANEL
HDL: 49 mg/dL (ref >39)
Total CHOL/HDL Ratio: 2.9 Ratio
VLDL: 22 mg/dL (ref 0–40)

## 2012-12-29 LAB — CBC
HCT: 33.4 % — ABNORMAL LOW (ref 39.0–52.0)
MCH: 25.4 pg — ABNORMAL LOW (ref 26.0–34.0)
MCV: 74.6 fL — ABNORMAL LOW (ref 78.0–100.0)
Platelets: 269 10*3/uL (ref 150–400)
RDW: 15.2 % (ref 11.5–15.5)

## 2012-12-29 LAB — COMPREHENSIVE METABOLIC PANEL
ALT: 42 U/L (ref 0–53)
AST: 28 U/L (ref 0–37)
Alkaline Phosphatase: 63 U/L (ref 39–117)
Creat: 0.72 mg/dL (ref 0.50–1.35)
Sodium: 134 mEq/L — ABNORMAL LOW (ref 135–145)
Total Bilirubin: 0.4 mg/dL (ref 0.3–1.2)
Total Protein: 6.7 g/dL (ref 6.0–8.3)

## 2012-12-29 LAB — PSA, MEDICARE: PSA: 2.27 ng/mL (ref <=4.00)

## 2012-12-29 NOTE — Evaluation (Signed)
Physical Therapy Evaluation  Patient Details  Name: Michael [name] MRN: [id number] Date of Birth: [birth year]  Today's Date: 12/29/2012 Time: 0850-0930 PT Time Calculation (min): 40 min Charges: 1 ROM, 1 MMT, 38' Self Care             Visit#: 12 of 12  Re-eval: 12/28/12  Diagnosis: L knee SALK procedure Surgical Date: 11/06/12 Next MD Visit: Dr. Aline Brochure- April 10  Authorization: MEDICARE    Authorization Time Period:    Authorization Visit#: 12 of 12   Subjective Symptoms/Limitations Symptoms: Pt states that his knee is about 30% better since starting therapy.  Pt still has pain and is still having difficulty walking without his quad cane.  Continues to have increased popping in his knee when walking.  How long can you sit comfortably?: Able to sit for an hour now was 45 minutes. How long can you stand comfortably?: The patient is able to stand with his cane for for 30 minutes( was 30 minutes)  Without cane pt is able to stand for one minute before his leg begins to hurt.   How long can you walk comfortably?: With his cane he is only walking 15 minutes at one time.  Was 15 minutes. Patient Stated Goals: "I want to finally ge rid of my SPC" Pain Assessment Currently in Pain?: Yes Pain Score:   3 (greatest in the past week has been a 5/10) Pain Location: Knee Pain Orientation: Left  Sensation/Coordination/Flexibility/Functional Tests Functional Tests Functional Tests: ABC scale 74% (was 62.8%) Functional Tests: 30 sec STS w/o HHA: 12 reps complete (was 8 reps)  LLE AROM (degrees) Left Knee Extension:  (was 0) Left Knee Flexion:  (was 150) LLE PROM (degrees) Left Knee Extension: 0 (was 0) Left Knee Flexion: 120 (was 108)  LLE Strength Left Hip Flexion: 4/5 (was 3/5) Left Hip Extension: 4/5 (was 2+/5) Left Hip ABduction: 5/5 (was 4/5) Left Hip ADduction: 4/5 (was 2+/5) Left Knee Flexion: 4/5 (was 3+/5) Left Knee Extension: 4/5 (was 3+/5)  Mobility/Balance   Ambulation/Gait Assistive device:  (was SPC) Stairs Assistance: 6: Modified independent (Device/Increase time) Static Standing Balance Single Leg Stance - Right Leg: 10 (was 10) Single Leg Stance - Left Leg: 8 (was 8)   Physical Therapy Assessment and Plan PT Assessment and Plan Clinical Impression Statement: Michael [name] has attended 12 OP PT visits over 4 weeks with the following findings: improved knee AROM 0-120, strength overall 4/5, improved balance confidence to 75%, continues to be limited by severe popping with single leg stance on LLE which causes increased pain.  Discharge session discussed: continuing with SPC due to pain, possibilities and preperations to make if pt decides to undergo a TKR.  Updated HEP to continue with strengtheing.  PT Plan: D/C    Goals Home Exercise Program PT Goal: Perform Home Exercise Program - Progress: Met PT Short Term Goals: 3 weeks PT Short Term Goal 1: PT will report pain less than 3/10 for 75% of his day for improved QOL>  (5/10on average, today 3/10): Progressing toward goal PT Short Term Goal 2: Pt will improve LE strength to [facility] in order to ambulate independently x30 minutes to continue comfortably with community ambulation.  (w/SPC): Partly met PT Short Term Goal 3: Pt will improve dynamic balance in order to ambulate independently in outdoor environment. : Progressing toward goal PT Short Term Goal 4: Pt will improve his ABC to greater than 40% for improved percieved functional ability.  (74%) Met PT Short Term  Goal 5: Pt will improve L knee AROM to [facility] in order to go from STS w/o UE A. : Met  Problem List Patient Active Problem List  Diagnosis  . DIABETES MELLITUS, WITHOUT COMPLICATIONS  . HYPERLIPIDEMIA  . OBESITY, MORBID  . HYPERTENSION  . GERD  . ARTHRITIS  . NASH (nonalcoholic steatohepatitis)  . Knee pain, bilateral  . Leg pain  . Patellar tendonitis  . Leg weakness  . Back pain  . Meniscus tear  . Osteoarthritis of left  knee  . S/P arthroscopy of left knee    PT - End of Session Activity Tolerance: Patient tolerated treatment well;Patient limited by pain General Behavior During Session: [facility] for tasks performed Cognition: [facility] for tasks performed PT Plan of Care PT Home Exercise Plan: see updated report PT Patient Instructions: discussed preperations and procedure of a TKR, use of SPC for pain control, f/u w/MD and advanced HEP.  Consulted and Agree with Plan of Care: Patient  GP Functional Assessment Tool Used: ABC:  Functional Limitation: Mobility: Walking and moving around Mobility: Walking and Moving Around Goal Status [phone]): At least 20 percent but less than 40 percent impaired, limited or restricted Mobility: Walking and Moving Around Discharge Status [id number]): At least 1 percent but less than 20 percent impaired, limited or restricted  Michael [name] 12/29/2012, 9:44 AM  Physician Documentation Your signature is required to indicate approval of the treatment plan as stated above.  Please sign and either send electronically or make a copy of this report for your files and return this physician signed original.   Please mark one 1.__approve of plan  2. ___approve of plan with the following conditions.   ______________________________                                                          _____________________ Physician Signature                                                                                                             Date

## 2013-01-05 ENCOUNTER — Encounter: Payer: Self-pay | Admitting: Family Medicine

## 2013-01-05 ENCOUNTER — Ambulatory Visit: Payer: Medicare Other | Admitting: Orthopedic Surgery

## 2013-01-05 ENCOUNTER — Ambulatory Visit (INDEPENDENT_AMBULATORY_CARE_PROVIDER_SITE_OTHER): Payer: Medicare Other | Admitting: Family Medicine

## 2013-01-05 VITALS — BP 132/70 | HR 99 | Resp 16 | Wt 212.1 lb

## 2013-01-05 DIAGNOSIS — Z1211 Encounter for screening for malignant neoplasm of colon: Secondary | ICD-10-CM

## 2013-01-05 DIAGNOSIS — M1710 Unilateral primary osteoarthritis, unspecified knee: Secondary | ICD-10-CM

## 2013-01-05 DIAGNOSIS — M1712 Unilateral primary osteoarthritis, left knee: Secondary | ICD-10-CM

## 2013-01-05 DIAGNOSIS — I10 Essential (primary) hypertension: Secondary | ICD-10-CM

## 2013-01-05 DIAGNOSIS — Z0000 Encounter for general adult medical examination without abnormal findings: Secondary | ICD-10-CM

## 2013-01-05 DIAGNOSIS — G5691 Unspecified mononeuropathy of right upper limb: Secondary | ICD-10-CM

## 2013-01-05 DIAGNOSIS — IMO0002 Reserved for concepts with insufficient information to code with codable children: Secondary | ICD-10-CM

## 2013-01-05 DIAGNOSIS — Z9889 Other specified postprocedural states: Secondary | ICD-10-CM

## 2013-01-05 DIAGNOSIS — E119 Type 2 diabetes mellitus without complications: Secondary | ICD-10-CM

## 2013-01-05 DIAGNOSIS — G609 Hereditary and idiopathic neuropathy, unspecified: Secondary | ICD-10-CM

## 2013-01-05 DIAGNOSIS — G629 Polyneuropathy, unspecified: Secondary | ICD-10-CM

## 2013-01-05 DIAGNOSIS — G5690 Unspecified mononeuropathy of unspecified upper limb: Secondary | ICD-10-CM

## 2013-01-05 LAB — POC HEMOCCULT BLD/STL (OFFICE/1-CARD/DIAGNOSTIC): Fecal Occult Blood, POC: NEGATIVE

## 2013-01-05 MED ORDER — HYDROCHLOROTHIAZIDE 25 MG PO TABS: 25.0000 mg | ORAL_TABLET | Freq: Every day | ORAL | Status: DC

## 2013-01-05 MED ORDER — GLIPIZIDE 10 MG PO TABS: 10.0000 mg | ORAL_TABLET | Freq: Two times a day (BID) | ORAL | Status: DC

## 2013-01-05 MED ORDER — HYDROCODONE-ACETAMINOPHEN 5-325 MG PO TABS: 1.0000 | ORAL_TABLET | ORAL | Status: DC | PRN

## 2013-01-05 MED ORDER — NAPROXEN 375 MG PO TABS: 375.0000 mg | ORAL_TABLET | Freq: Two times a day (BID) | ORAL | Status: DC

## 2013-01-05 NOTE — Patient Instructions (Addendum)
Labs look good! Get the shingles vaccine from your pharmacy Referral to Eye doctor Referral to neurologist for the neuropathy in your hand Continue current medications Check your blood sugars fasting and in the evening and record New anti-inflammatory naprosyn twice a day  Desitin cream twice a day for next week Start your iron tablets twice a day F/U 3 months

## 2013-01-05 NOTE — Assessment & Plan Note (Signed)
On hydrocodone , will change NSAID to low dose naprosyn, he did not see much difference with etodolac F/U ortho

## 2013-01-05 NOTE — Assessment & Plan Note (Signed)
Known neuropathy in feet Now with symptoms in Right hand, ? Median nerve releated vs diabetes or other neuropathy, as this is interfering with ADL's will send to neurology for nerve conduction studies

## 2013-01-05 NOTE — Addendum Note (Signed)
Addended by: Denman George B on: 01/05/2013 05:10 PM   Modules accepted: Orders

## 2013-01-05 NOTE — Progress Notes (Signed)
Subjective:    Patient ID: Michael [name], male    DOB: 1946/07/18, 67 y.o.   MRN: [id number]  HPI Subjective:   Patient presents for Medicare Annual/Subsequent preventive examination.    Patient here for annual Medicare physical exam. She's concerned about tingling and numbness in his right hand which are worsened over the past 3 weeks he is now been dropping his utensils to feel like he cannot grass as he used to. He is using his right hand more with his cane secondary to left knee pains and status post arthroscopic left knee. His blood sugars fasting have been 100 to 140s he is taking insulin 23 units twice a day without any problems. Fasting labs reviewed today. He is to followup with podiatry after he settles his bill. He is due for eye exam  Review Past Medical/Family/Social: - Reviewed in EMR   Risk Factors  Current exercise habits:  None Dietary issues discussed:  Yes   Cardiac risk factors: Obesity (BMI >= 30 kg/m2).   Depression Screen  (Note: if answer to either of the following is "Yes", a more complete depression screening is indicated)  Over the past two weeks, have you felt down, depressed or hopeless? No Over the past two weeks, have you felt little interest or pleasure in doing things? No Have you lost interest or pleasure in daily life? No Do you often feel hopeless? No Do you cry easily over simple problems? No   Activities of Daily Living  In your present state of health, do you have any difficulty performing the following activities?:  Driving? No  Managing money? No  Feeding yourself? No  Getting from bed to chair? No  Climbing a flight of stairs? Yes Preparing food and eating?: No  Bathing or showering? No  Getting dressed: No  Getting to the toilet? No  Using the toilet:No  Moving around from place to place: No  In the past year have you fallen or had a near fall?:No  Are you sexually active? No  Do you have more than one partner? No   Hearing  Difficulties: No  Do you often ask people to speak up or repeat themselves? No  Do you experience ringing or noises in your ears? No Do you have difficulty understanding soft or whispered voices? No  Do you feel that you have a problem with memory? No Do you often misplace items? No  Do you feel safe at home? Yes  Cognitive Testing  Alert? Yes Normal Appearance?Yes  Oriented to person? Yes Place? Yes  Time? Yes  Recall of three objects? Yes  Can perform simple calculations? Yes  Displays appropriate judgment?Yes  Can read the correct time from a watch face?Yes   List the Names of Other Physician/Practitioners you currently use:    Dr. Aline Brochure- Orthopedics,  Dr. Berline Lopes- Podiatry   Indicate any recent Medical Services you may have received from other than [facility] providers in the past year (date may be approximate).   Screening Tests / Date Colonoscopy      - UTD             Zostavax - DUE Tetanus/tdap- unable to due insurance     Assessment:    Annual wellness medicare exam   Plan:    During the course of the visit the patient was educated and counseled about appropriate screening and preventive services including:   Shingles vaccine. Prescription given to that she can get the vaccine at the pharmacy or  Medicare part D.  Screen negative for depression.  Diet review for nutrition referral? Yes ____ Not Indicated __x__  Patient Instructions (the written plan) was given to the patient.  Medicare Attestation  I have personally reviewed:  The patient's medical and social history  Their use of alcohol, tobacco or illicit drugs  Their current medications and supplements  The patient's functional ability including ADLs,fall risks, home safety risks, cognitive, and hearing and visual impairment  Diet and physical activities  Evidence for depression or mood disorders  The patient's weight, height, BMI, and visual acuity have been recorded in the chart. I have made referrals,  counseling, and provided education to the patient based on review of the above and I have provided the patient with a written personalized care plan for preventive services.        Review of Systems - per above    GEN- denies fatigue, fever, weight loss,weakness, recent illness HEENT- denies eye drainage, change in vision, nasal discharge, CVS- denies chest pain, palpitations RESP- denies SOB, cough, wheeze ABD- denies N/V, change in stools, abd pain GU- denies dysuria, hematuria, dribbling, incontinence MSK- + joint pain, muscle aches, injury Neuro- denies headache, dizziness, syncope, seizure activity, + tingling numbness in Right hand/fingers      Objective:   Physical Exam  GEN- NAD, alert and oriented x3 HEENT- PERRL, EOMI, non injected sclera, pink conjunctiva, MMM, oropharynx clear Neck- Supple,  CVS- Tachycardia, ? PVC, no murmur RESP-CTAB ABD-NABS,soft,NT,ND GU- gluteal crease mild erythema with small liner crack in skin, no drainage, prostate smooth, not enlarged, no nodules EXT- Trace pedal edema Pulses- Radial, DP- 2+ Diabetic foot exam below  EKG-NSR with sinus arrythmia, compared to Feb 2014     Assessment & Plan:

## 2013-01-05 NOTE — Assessment & Plan Note (Signed)
Well controlled 

## 2013-01-05 NOTE — Assessment & Plan Note (Signed)
A1C much improved , continue current regimen Referral to opthalmology Podiatry pending

## 2013-01-05 NOTE — Assessment & Plan Note (Signed)
He is being more active with watching his diet, decreased mobility

## 2013-01-06 LAB — MICROALBUMIN / CREATININE URINE RATIO: Microalb, Ur: 0.5 mg/dL (ref 0.00–1.89)

## 2013-01-07 ENCOUNTER — Encounter: Payer: Self-pay | Admitting: Orthopedic Surgery

## 2013-01-07 ENCOUNTER — Ambulatory Visit (INDEPENDENT_AMBULATORY_CARE_PROVIDER_SITE_OTHER): Payer: Medicare Other | Admitting: Orthopedic Surgery

## 2013-01-07 VITALS — BP 116/60 | Ht 63.0 in | Wt 211.0 lb

## 2013-01-07 DIAGNOSIS — Z9889 Other specified postprocedural states: Secondary | ICD-10-CM

## 2013-01-07 NOTE — Progress Notes (Signed)
Patient ID: Michael [name], male   DOB: 15-Oct-1945, 67 y.o.   MRN: [id number] Chief Complaint  Patient presents with  . Follow-up    1 month follow up SALK DOS 11/06/12    BP 116/60  Ht 5' 3"  (1.6 m)  Wt 211 lb (95.709 kg)  BMI 37.39 kg/m2  The patient complains of soreness in his knees therapy is finished she still using a cane he has good range of motion but has soreness in his quads medial hamstrings posterior hamstrings bursal tissue medially  Recommend anti-inflammatory continue Norco follow up with [place] in 3 months

## 2013-01-07 NOTE — Patient Instructions (Addendum)
Pick up naproxen when you can   Apply ice as needed   Use cane   Return in 3 months

## 2013-01-25 ENCOUNTER — Encounter: Payer: Self-pay | Admitting: Family Medicine

## 2013-01-28 ENCOUNTER — Other Ambulatory Visit: Payer: Self-pay | Admitting: Family Medicine

## 2013-02-08 ENCOUNTER — Other Ambulatory Visit: Payer: Self-pay | Admitting: Family Medicine

## 2013-02-10 ENCOUNTER — Telehealth: Payer: Self-pay | Admitting: Family Medicine

## 2013-02-10 ENCOUNTER — Other Ambulatory Visit: Payer: Self-pay

## 2013-02-10 MED ORDER — INSULIN ASPART PROT & ASPART (70-30 MIX) 100 UNIT/ML ~~LOC~~ SUSP: 23.0000 [IU] | Freq: Two times a day (BID) | SUBCUTANEOUS | Status: DC

## 2013-02-10 NOTE — Telephone Encounter (Signed)
He is out needs a new RX for 23 units 2 x times a day

## 2013-02-10 NOTE — Telephone Encounter (Signed)
This has been sent in

## 2013-02-23 ENCOUNTER — Encounter: Payer: Self-pay | Admitting: Family Medicine

## 2013-03-09 ENCOUNTER — Other Ambulatory Visit: Payer: Self-pay | Admitting: Family Medicine

## 2013-03-09 NOTE — Telephone Encounter (Signed)
Okay to refill- with 3 refills

## 2013-03-09 NOTE — Telephone Encounter (Signed)
?  ok to refill °

## 2013-03-10 NOTE — Telephone Encounter (Signed)
Med refilled.

## 2013-03-12 ENCOUNTER — Other Ambulatory Visit: Payer: Self-pay | Admitting: Family Medicine

## 2013-03-16 NOTE — Telephone Encounter (Signed)
Already refilled with 3 refills on 03/09/13

## 2013-03-29 ENCOUNTER — Other Ambulatory Visit: Payer: Self-pay | Admitting: Family Medicine

## 2013-04-06 ENCOUNTER — Ambulatory Visit: Payer: Medicare Other | Admitting: Family Medicine

## 2013-04-06 ENCOUNTER — Ambulatory Visit: Payer: Self-pay | Admitting: Family Medicine

## 2013-04-08 ENCOUNTER — Ambulatory Visit: Payer: Medicare Other | Admitting: Orthopedic Surgery

## 2013-04-08 ENCOUNTER — Other Ambulatory Visit: Payer: Self-pay

## 2013-04-13 ENCOUNTER — Encounter: Payer: Self-pay | Admitting: Orthopedic Surgery

## 2013-04-13 ENCOUNTER — Ambulatory Visit (INDEPENDENT_AMBULATORY_CARE_PROVIDER_SITE_OTHER): Payer: Medicare Other

## 2013-04-13 ENCOUNTER — Other Ambulatory Visit: Payer: Self-pay | Admitting: Family Medicine

## 2013-04-13 ENCOUNTER — Ambulatory Visit (INDEPENDENT_AMBULATORY_CARE_PROVIDER_SITE_OTHER): Payer: Medicare Other | Admitting: Orthopedic Surgery

## 2013-04-13 ENCOUNTER — Encounter: Payer: Self-pay | Admitting: Family Medicine

## 2013-04-13 ENCOUNTER — Ambulatory Visit (INDEPENDENT_AMBULATORY_CARE_PROVIDER_SITE_OTHER): Payer: Medicare Other | Admitting: Family Medicine

## 2013-04-13 VITALS — BP 144/78 | Ht 63.0 in | Wt 211.0 lb

## 2013-04-13 VITALS — BP 132/70 | HR 68 | Temp 98.6°F | Resp 18 | Wt 210.0 lb

## 2013-04-13 DIAGNOSIS — M79609 Pain in unspecified limb: Secondary | ICD-10-CM

## 2013-04-13 DIAGNOSIS — R609 Edema, unspecified: Secondary | ICD-10-CM

## 2013-04-13 DIAGNOSIS — I890 Lymphedema, not elsewhere classified: Secondary | ICD-10-CM | POA: Insufficient documentation

## 2013-04-13 DIAGNOSIS — Z9889 Other specified postprocedural states: Secondary | ICD-10-CM | POA: Insufficient documentation

## 2013-04-13 DIAGNOSIS — E119 Type 2 diabetes mellitus without complications: Secondary | ICD-10-CM

## 2013-04-13 DIAGNOSIS — G609 Hereditary and idiopathic neuropathy, unspecified: Secondary | ICD-10-CM

## 2013-04-13 DIAGNOSIS — M5137 Other intervertebral disc degeneration, lumbosacral region: Secondary | ICD-10-CM | POA: Insufficient documentation

## 2013-04-13 DIAGNOSIS — I10 Essential (primary) hypertension: Secondary | ICD-10-CM

## 2013-04-13 DIAGNOSIS — M79605 Pain in left leg: Secondary | ICD-10-CM

## 2013-04-13 DIAGNOSIS — M1710 Unilateral primary osteoarthritis, unspecified knee: Secondary | ICD-10-CM

## 2013-04-13 DIAGNOSIS — M1712 Unilateral primary osteoarthritis, left knee: Secondary | ICD-10-CM

## 2013-04-13 DIAGNOSIS — G5600 Carpal tunnel syndrome, unspecified upper limb: Secondary | ICD-10-CM

## 2013-04-13 DIAGNOSIS — G629 Polyneuropathy, unspecified: Secondary | ICD-10-CM

## 2013-04-13 DIAGNOSIS — G5601 Carpal tunnel syndrome, right upper limb: Secondary | ICD-10-CM

## 2013-04-13 DIAGNOSIS — IMO0002 Reserved for concepts with insufficient information to code with codable children: Secondary | ICD-10-CM

## 2013-04-13 DIAGNOSIS — M51379 Other intervertebral disc degeneration, lumbosacral region without mention of lumbar back pain or lower extremity pain: Secondary | ICD-10-CM | POA: Insufficient documentation

## 2013-04-13 LAB — CBC
Platelets: 281 10*3/uL (ref 150–400)
RBC: 4.37 MIL/uL (ref 4.22–5.81)
RDW: 15.9 % — ABNORMAL HIGH (ref 11.5–15.5)
WBC: 5.2 10*3/uL (ref 4.0–10.5)

## 2013-04-13 LAB — HEMOGLOBIN A1C
Hgb A1c MFr Bld: 6.2 % — ABNORMAL HIGH (ref <5.7)
Mean Plasma Glucose: 131 mg/dL — ABNORMAL HIGH (ref <117)

## 2013-04-13 LAB — BASIC METABOLIC PANEL
CO2: 26 mEq/L (ref 19–32)
Chloride: 98 mEq/L (ref 96–112)
Potassium: 4.9 mEq/L (ref 3.5–5.3)
Sodium: 138 mEq/L (ref 135–145)

## 2013-04-13 MED ORDER — GABAPENTIN 400 MG PO CAPS: 400.0000 mg | ORAL_CAPSULE | Freq: Three times a day (TID) | ORAL | Status: DC

## 2013-04-13 MED ORDER — BLOOD GLUCOSE METER KIT: PACK | Status: DC

## 2013-04-13 MED ORDER — FUROSEMIDE 20 MG PO TABS: 20.0000 mg | ORAL_TABLET | Freq: Every day | ORAL | Status: DC

## 2013-04-13 MED ORDER — LANCETS 28G MISC: 1.0000 | Freq: Three times a day (TID) | Status: DC

## 2013-04-13 MED ORDER — GLUCOSE BLOOD VI STRP: ORAL_STRIP | Status: DC

## 2013-04-13 NOTE — Assessment & Plan Note (Signed)
He has ongoing weakness and pain of his left knee he will followup with orthopedics. I will continue providing his hydrocodone

## 2013-04-13 NOTE — Progress Notes (Signed)
  Subjective:    Patient ID: Michael [name], male    DOB: 02-Jan-1946, 67 y.o.   MRN: [id number]  HPI   History of follow chronic medical problems. He continues to have pain with his left knee he is status post arthroscopic secondary to meniscal tear and osteoarthritis. He is followup with orthopedic pending. He is still using the hydrocodone.  Diabetes mellitus he's been out of his test strips and needs a new meter. He denies any symptoms of hypoglycemia. He is still taking his insulin as well as oral medications as prescribed. He was seen by the eye Dr. his podiatry visit is still pending  He noticed more leg swelling after walking around during the day. Often this makes his legs very tight and painful. It is worse on the left side where he has any troubles. He denies any shortness of breath or chest pain.  Neuropathy of right hand. He continues to have tingling and numbness and dropping objects. He also has difficulty tying his shoes he was seen by neurology in [place] however he did not have a good experience care. He states he had a plate 3 hours before he was seen by the physician. He was set up to have nerve conduction studies but decided that he would like to see a different neurologist for his care. He was given a prescription for a brace for his arm for likely diagnosis of carpal, however he did not try this   Review of Systems   GEN- denies fatigue, fever, weight loss,weakness, recent illness HEENT- denies eye drainage, change in vision, nasal discharge, CVS- denies chest pain, palpitations RESP- denies SOB, cough, wheeze ABD- denies N/V, change in stools, abd pain GU- denies dysuria, hematuria, dribbling, incontinence MSK- +joint pain, muscle aches, injury Neuro- denies headache, dizziness, syncope, seizure activity      Objective:   Physical Exam GEN- NAD, alert and oriented x3 HEENT- PERRL, EOMI, non injected sclera, pink conjunctiva, MMM, oropharynx clear Neck- Supple,   CVS- RRR, no murmur RESP-CTAB EXT- +pitting edema Pulses- Radial, DP- 2+ Feet- no open lesions, long nails, thick nails       Assessment & Plan:

## 2013-04-13 NOTE — Addendum Note (Signed)
Addended by: Daylene Posey T on: 04/13/2013 09:59 AM   Modules accepted: Orders

## 2013-04-13 NOTE — Progress Notes (Signed)
Patient ID: Michael [name], male   DOB: 06-22-1946, 67 y.o.   MRN: [id number] Chief Complaint  Patient presents with  . Follow-up    3 month recheck left knee SALK 11/06/12   PRE-OPERATIVE DIAGNOSIS: torn medial meniscus left knee  POST-OPERATIVE DIAGNOSIS: torn medial meniscus left knee  FINDINGS: MEDIAL MENISCUS TEAR, POSTERIOR HORN; GRADE 2 CHONDRAL LESION PATELLA, SYNOVITIS MODERATE    The patient is complaining of left thigh pain some mild left knee pain. His findings from operation are noted and reviewed. He said the pain started just after surgery he thought it might have been the way was walking but it has persisted and gotten worse. He is a diabetic. He had a back x-ray in 2003 which showed degenerative disc disease at L3 and 4 [place] L4 and 5. He was having some back pain back at that time he denies back pain at this time but the pain in his left thigh has become severe  GI system still working fine GU system working fine as well  BP 144/78  Ht 5' 3"  (1.6 m)  Wt 211 lb (95.709 kg)  BMI 37.39 kg/m2 General appearance is normal, the patient is alert and oriented x3 with normal mood and affect. Ambulating with a cane which has persisted pre-and postop. His left knee has some tenderness over the medial joint line some lateral joint line tenderness mild effusion but his range of motion is excellent strength is good he has normal sensation in his leg he has no instability muscle tone is normal skin portals are nice and clean no peripheral edema  He has no back pain or tenderness but he is tenderness left thigh he occasionally will get some pain behind his left knee and left thigh that'll radiate down into his foot  New x-rays were obtained which shows degenerative disc disease starting at T11-T12 and intermittently throughout the lumbar spine  I think this is where his pain source is  I will like to start him on steroids but he is a diabetic he takes insulin sawed opted against that at  this point and recommended that he take 400 mg  gabapentin every 8 hours instead of 300. If you do not improve MRI to give him epidural steroid injections  Encounter Diagnosis  Name Primary?  . Left leg pain Yes

## 2013-04-13 NOTE — Assessment & Plan Note (Signed)
His diabetes has been well-controlled appear we will give him a new meter with supplies. He will continue his current dose of 70 3023 units twice a day. He will continue the glipizide and metformin I will recheck an A1c

## 2013-04-13 NOTE — Assessment & Plan Note (Signed)
I will DC the hydrochlorothiazide. I think this is due to some pooling especially after his surgery his edema has worsened. I will start him on Lasix once a day

## 2013-04-13 NOTE — Assessment & Plan Note (Signed)
Well controlled 

## 2013-04-13 NOTE — Assessment & Plan Note (Signed)
Diagnoses given by neurology he'll be referred to a new neurologist for second opinion and conduction studies

## 2013-04-13 NOTE — Patient Instructions (Addendum)
Continue current medications Iron table once a day Stop the HCTZ, start lasix in morning for leg swelling We will call with lab results Referral to [facility] in [place] diabetes supplies sent  F/U 3 months

## 2013-04-13 NOTE — Assessment & Plan Note (Signed)
I will send him to the neurologist and at his request. She does need conduction studies done on his pain. He was diagnosed with likely carpal tunnel syndrome he has known peripheral neuropathy in his feet secondary to diabetes

## 2013-04-14 ENCOUNTER — Other Ambulatory Visit: Payer: Self-pay | Admitting: Family Medicine

## 2013-04-14 DIAGNOSIS — D509 Iron deficiency anemia, unspecified: Secondary | ICD-10-CM

## 2013-04-14 DIAGNOSIS — Z8719 Personal history of other diseases of the digestive system: Secondary | ICD-10-CM

## 2013-04-14 LAB — IRON AND TIBC
%SAT: 6 % — ABNORMAL LOW (ref 20–55)
TIBC: 410 ug/dL (ref 215–435)
UIBC: 387 ug/dL (ref 125–400)

## 2013-04-18 ENCOUNTER — Telehealth: Payer: Self-pay | Admitting: Family Medicine

## 2013-04-21 ENCOUNTER — Other Ambulatory Visit: Payer: Self-pay | Admitting: Family Medicine

## 2013-04-21 MED ORDER — NEEDLES & SYRINGES MISC: 23.0000 [IU] | Freq: Two times a day (BID) | Status: DC

## 2013-04-21 NOTE — Telephone Encounter (Signed)
Med refilled while on phone with pt

## 2013-04-25 NOTE — Telephone Encounter (Signed)
Completed 04/21/13

## 2013-04-27 ENCOUNTER — Other Ambulatory Visit: Payer: Self-pay | Admitting: Family Medicine

## 2013-04-27 NOTE — Telephone Encounter (Signed)
Medication refilled per protocol. 

## 2013-04-28 ENCOUNTER — Telehealth: Payer: Self-pay | Admitting: Family Medicine

## 2013-04-28 NOTE — Telephone Encounter (Signed)
Stop the lasix- it is possible that the rash is from the medicine, use benadryl for the rash  Elevate legs and take off compression hose as this may irritate the rash If  The rash does not improve by Monday come in to have me look at it After rash clears - go back to the HCTZ and compression hose for now

## 2013-04-28 NOTE — Telephone Encounter (Signed)
..  Patient aware and requested appt. for Monday just to follow up. Appt. Made for 05/03/13.

## 2013-04-30 ENCOUNTER — Telehealth: Payer: Self-pay | Admitting: Family Medicine

## 2013-04-30 NOTE — Telephone Encounter (Signed)
Refill to soon, we ordered this med on 02/2013 with 3 refills its tooo soon

## 2013-05-02 ENCOUNTER — Emergency Department (HOSPITAL_COMMUNITY): Payer: Medicare Other

## 2013-05-02 ENCOUNTER — Emergency Department (HOSPITAL_COMMUNITY)
Admission: EM | Admit: 2013-05-02 | Discharge: 2013-05-02 | Disposition: A | Discharge status: Home / Self Care | Payer: Medicare Other | Attending: Emergency Medicine | Admitting: Emergency Medicine

## 2013-05-02 ENCOUNTER — Encounter (HOSPITAL_COMMUNITY): Payer: Self-pay | Admitting: *Deleted

## 2013-05-02 DIAGNOSIS — J4489 Other specified chronic obstructive pulmonary disease: Secondary | ICD-10-CM | POA: Insufficient documentation

## 2013-05-02 DIAGNOSIS — I10 Essential (primary) hypertension: Secondary | ICD-10-CM | POA: Insufficient documentation

## 2013-05-02 DIAGNOSIS — Z87891 Personal history of nicotine dependence: Secondary | ICD-10-CM | POA: Insufficient documentation

## 2013-05-02 DIAGNOSIS — K219 Gastro-esophageal reflux disease without esophagitis: Secondary | ICD-10-CM | POA: Insufficient documentation

## 2013-05-02 DIAGNOSIS — R5381 Other malaise: Secondary | ICD-10-CM | POA: Insufficient documentation

## 2013-05-02 DIAGNOSIS — Z7982 Long term (current) use of aspirin: Secondary | ICD-10-CM | POA: Insufficient documentation

## 2013-05-02 DIAGNOSIS — Z79899 Other long term (current) drug therapy: Secondary | ICD-10-CM | POA: Insufficient documentation

## 2013-05-02 DIAGNOSIS — J449 Chronic obstructive pulmonary disease, unspecified: Secondary | ICD-10-CM | POA: Insufficient documentation

## 2013-05-02 DIAGNOSIS — R5383 Other fatigue: Secondary | ICD-10-CM

## 2013-05-02 DIAGNOSIS — D649 Anemia, unspecified: Secondary | ICD-10-CM | POA: Insufficient documentation

## 2013-05-02 DIAGNOSIS — M7989 Other specified soft tissue disorders: Secondary | ICD-10-CM | POA: Insufficient documentation

## 2013-05-02 DIAGNOSIS — E785 Hyperlipidemia, unspecified: Secondary | ICD-10-CM | POA: Insufficient documentation

## 2013-05-02 DIAGNOSIS — Z791 Long term (current) use of non-steroidal anti-inflammatories (NSAID): Secondary | ICD-10-CM | POA: Insufficient documentation

## 2013-05-02 DIAGNOSIS — E1149 Type 2 diabetes mellitus with other diabetic neurological complication: Secondary | ICD-10-CM | POA: Insufficient documentation

## 2013-05-02 DIAGNOSIS — E1142 Type 2 diabetes mellitus with diabetic polyneuropathy: Secondary | ICD-10-CM | POA: Insufficient documentation

## 2013-05-02 LAB — CBC WITH DIFFERENTIAL/PLATELET
Basophils Absolute: 0.1 10*3/uL (ref 0.0–0.1)
Eosinophils Absolute: 0.1 10*3/uL (ref 0.0–0.7)
Hemoglobin: 9.7 g/dL — ABNORMAL LOW (ref 13.0–17.0)
Lymphocytes Relative: 22 % (ref 12–46)
MCHC: 30.1 g/dL (ref 30.0–36.0)
Neutrophils Relative %: 65 % (ref 43–77)
Platelets: 183 10*3/uL (ref 150–400)
RDW: 17.8 % — ABNORMAL HIGH (ref 11.5–15.5)

## 2013-05-02 LAB — BASIC METABOLIC PANEL
BUN: 9 mg/dL (ref 6–23)
Calcium: 10.1 mg/dL (ref 8.4–10.5)
Creatinine, Ser: 0.6 mg/dL (ref 0.50–1.35)
GFR calc Af Amer: >90 mL/min (ref >90)
GFR calc non Af Amer: >90 mL/min (ref >90)
Potassium: 4 mEq/L (ref 3.5–5.1)

## 2013-05-02 LAB — URINALYSIS, ROUTINE W REFLEX MICROSCOPIC
Hgb urine dipstick: NEGATIVE
Ketones, ur: NEGATIVE mg/dL
Nitrite: NEGATIVE
Urobilinogen, UA: 0.2 mg/dL (ref 0.0–1.0)

## 2013-05-02 LAB — TROPONIN I: Troponin I: <0.3 ng/mL (ref <0.30)

## 2013-05-02 NOTE — ED Notes (Signed)
Pt ambulated in hall with cane, slow and steady gait. Denies dizziness/weakness. c/o soreness to left hip and knee. nad noted.

## 2013-05-02 NOTE — ED Provider Notes (Signed)
CSN: [id number]     Arrival date & time 05/02/13  1234 History     First MD Initiated Contact with Patient 05/02/13 1254     Chief Complaint  Patient presents with  . Fatigue    Patient is a 67 y.o. male presenting with weakness. The history is provided by the patient.  Weakness This is a new problem. The current episode started more than 2 days ago. The problem occurs daily. The problem has not changed since onset.Pertinent negatives include no chest pain, no abdominal pain, no headaches and no shortness of breath. The symptoms are aggravated by exertion. The symptoms are relieved by rest. He has tried rest for the symptoms. The treatment provided no relief.    Past Medical History  Diagnosis Date  . COPD (chronic obstructive pulmonary disease)   . Diabetes mellitus   . Hyperlipidemia   . Hypertension   . Anemia   . NASH (nonalcoholic steatohepatitis)   . Diabetic neuropathy   . GERD (gastroesophageal reflux disease)    Past Surgical History  Procedure Laterality Date  . Hernia repair    . Shoulder surgery      right  . Laparoscopy      stomach  . Knee arthroscopy with medial menisectomy Left 11/06/2012    Procedure: KNEE ARTHROSCOPY WITH MEDIAL MENISECTOMY;  Surgeon: Carole Civil, MD;  Location: [facility];  Service: Orthopedics;  Laterality: Left;   Family History  Problem Relation Age of Onset  . Heart disease Mother   . Hyperlipidemia Mother   . Hypertension Mother   . Depression Mother   . Diabetes Mother   . Heart disease Father   . Hypertension Father   . Hyperlipidemia Father   . Diabetes Father   . Heart disease Sister   . Hyperlipidemia Sister   . Hypertension Sister   . Diabetes Sister   . Diabetes Brother   . Heart disease Sister   . Hyperlipidemia Sister   . Hypertension Sister   . Heart disease Sister   . Hyperlipidemia Sister   . Hypertension Sister   . Diabetes Sister   . Diabetes Brother    History  Substance Use Topics  . Smoking  status: Former Smoker -- 2.50 packs/day for 44 years  . Smokeless tobacco: Former Systems developer    Quit date: 09/30/2010  . Alcohol Use: No    Review of Systems  Constitutional: Positive for fatigue. Negative for fever.  Respiratory: Negative for cough and shortness of breath.   Cardiovascular: Positive for leg swelling. Negative for chest pain.  Gastrointestinal: Negative for vomiting, abdominal pain, diarrhea and blood in stool.  Genitourinary: Negative for dysuria.  Musculoskeletal: Negative for back pain.  Neurological: Positive for weakness. Negative for dizziness and headaches.  All other systems reviewed and are negative.    Allergies  Review of patient's allergies indicates no known allergies.  Home Medications   Current Outpatient Rx  Name  Route  Sig  Dispense  Refill  . amLODipine (NORVASC) 10 MG tablet   Oral   Take 1 tablet (10 mg total) by mouth daily.   90 tablet   1   . aspirin 81 MG tablet   Oral   Take 81 mg by mouth daily.         . benazepril (LOTENSIN) 40 MG tablet   Oral   Take 40 mg by mouth daily.         . Blood Glucose Monitoring Suppl (BLOOD GLUCOSE METER) kit  Use as instructed  DX250.00   1 each   1   . cloNIDine (CATAPRES) 0.1 MG tablet   Oral   Take 1 tablet (0.1 mg total) by mouth 2 (two) times daily.   60 tablet   6   . diclofenac sodium (VOLTAREN) 1 % GEL   Topical   Apply 1 application topically 4 (four) times daily.   5 Tube   3   . ferrous sulfate 325 (65 FE) MG tablet   Oral   Take 325 mg by mouth 2 (two) times daily.          . furosemide (LASIX) 20 MG tablet   Oral   Take 1 tablet (20 mg total) by mouth daily.   30 tablet   3   . gabapentin (NEURONTIN) 400 MG capsule   Oral   Take 1 capsule (400 mg total) by mouth 3 (three) times daily.   60 capsule   1   . glipiZIDE (GLUCOTROL) 10 MG tablet      TAKE 1 TABLET BY MOUTH 2 TIMES DAILY BEFORE A MEAL.   90 tablet   1   . glucose blood test strip       Use as instructed  DX 250.00   100 each   1   . HYDROcodone-acetaminophen (NORCO/VICODIN) 5-325 MG per tablet      TAKE 1 TABLET EVERY 4 HOURS AS NEEDED FOR PAIN   60 tablet   3   . insulin aspart protamine- aspart (NOVOLOG 70/30) (70-30) 100 UNIT/ML injection   Subcutaneous   Inject 0.23 mLs (23 Units total) into the skin 2 (two) times daily with a meal.   10 mL   5   . Lancets 28G MISC   Does not apply   1 each by Does not apply route 3 (three) times daily. Test three times a day  DX 250.00   1 each   1   . metFORMIN (GLUCOPHAGE) 1000 MG tablet   Oral   Take 1 tablet (1,000 mg total) by mouth 2 (two) times daily with a meal.   60 tablet   6   . naproxen (NAPROSYN) 375 MG tablet      TAKE 1 TABLET (375 MG TOTAL) BY MOUTH 2 (TWO) TIMES DAILY WITH A MEAL. FOR ARTHRITIS   60 tablet   2   . Needles & Syringes MISC   Does not apply   23 Units by Does not apply route 2 (two) times daily.   100 each   1     DX:250.00   . omeprazole (PRILOSEC) 20 MG capsule   Oral   Take 1 capsule (20 mg total) by mouth 2 (two) times daily.   60 capsule   6   . simvastatin (ZOCOR) 40 MG tablet   Oral   Take 0.5 tablets (20 mg total) by mouth every evening.   45 tablet   1   . traMADol (ULTRAM) 50 MG tablet      TAKE 1 TABLET BY MOUTH 3 TIMES DAILY.   60 tablet   3   . vitamin C (ASCORBIC ACID) 500 MG tablet   Oral   Take 500 mg by mouth daily.          BP 168/90  Pulse 105  Temp(Src) 98.7 F (37.1 C) (Oral)  Resp 16  Ht 5' 4"  (1.626 m)  Wt 210 lb (95.255 kg)  BMI 36.03 kg/m2  SpO2 99% Physical Exam CONSTITUTIONAL: Well  developed/well nourished HEAD: Normocephalic/atraumatic EYES: EOMI/PERRL ENMT: Mucous membranes moist NECK: supple no meningeal signs SPINE:entire spine nontender CV: S1/S2 noted, no murmurs/rubs/gallops noted LUNGS: Lungs are clear to auscultation bilaterally, no apparent distress ABDOMEN: soft, nontender, no rebound or  guarding Obese Easily reducible umbilical hernia noted.  No warmth or significant tenderness noted GU:no cva tenderness NEURO: Pt is awake/alert, moves all extremitiesx4, no arm or leg drift.  No facial droop EXTREMITIES: pulses normal, full ROM Symmetric pitting edema to bilateral LE SKIN: warm, color normal PSYCH: no abnormalities of mood noted  ED Course   Procedures   Labs Reviewed  CBC WITH DIFFERENTIAL - Abnormal; Notable for the following:    Hemoglobin 9.7 (*)    HCT 32.2 (*)    MCV 72.9 (*)    MCH 21.9 (*)    RDW 17.8 (*)    All other components within normal limits  BASIC METABOLIC PANEL  TROPONIN I  URINALYSIS, ROUTINE W REFLEX MICROSCOPIC   Pt presents for fatigue for one week.   No focal weakness No cp/sob, doubt ACS at this time No signs of acute anemia He is ambulatory at baseline He has f/u with PCP this week It appears his LE edema is chronic, doubt acute CHF We discussed strict return precautions  MDM  Nursing notes including past medical history and social history reviewed and considered in documentation xrays reviewed and considered Labs/vital reviewed and considered     Date: 05/02/2013  Rate: 86  Rhythm: normal sinus rhythm  QRS Axis: normal  Intervals: normal  ST/T Wave abnormalities: nonspecific ST changes  Conduction Disutrbances:none  Narrative Interpretation:   Old EKG Reviewed: unchanged from 10/2012      Sharyon Cable, MD 05/02/13 1515

## 2013-05-02 NOTE — ED Notes (Signed)
Pt c/o generalized weakness, bilateral lower extremity swelling that started two weeks ago, is concerned that his "blood count" may be low, states that he had to have a blood transfusion in the past and feels the same way. Denies any blood in stool. C/o pain to left hip area and bilateral lower legs.

## 2013-05-03 ENCOUNTER — Ambulatory Visit (INDEPENDENT_AMBULATORY_CARE_PROVIDER_SITE_OTHER): Payer: Medicare Other | Admitting: Family Medicine

## 2013-05-03 ENCOUNTER — Encounter: Payer: Self-pay | Admitting: Family Medicine

## 2013-05-03 VITALS — BP 142/72 | HR 72 | Temp 98.7°F | Resp 18 | Wt 207.0 lb

## 2013-05-03 DIAGNOSIS — R609 Edema, unspecified: Secondary | ICD-10-CM

## 2013-05-03 DIAGNOSIS — R600 Localized edema: Secondary | ICD-10-CM

## 2013-05-03 DIAGNOSIS — I10 Essential (primary) hypertension: Secondary | ICD-10-CM

## 2013-05-03 MED ORDER — HYDROCODONE-ACETAMINOPHEN 5-325 MG PO TABS: ORAL_TABLET | ORAL | Status: DC

## 2013-05-03 MED ORDER — TORSEMIDE 20 MG PO TABS: 20.0000 mg | ORAL_TABLET | Freq: Every day | ORAL | Status: DC

## 2013-05-03 NOTE — Progress Notes (Signed)
  Subjective:    Patient ID: Michael [name], male    DOB: 1946-05-29, 67 y.o.   MRN: [id number]  HPI  Patient here to followup ER visit. He was seen in the ER yesterday secondary to some nonspecific he and weakness he thought he was having a mini stroke. Evaluation was benign with the exception of worsening leg swelling. His blood pressure was also slightly elevated. He's here today for followup of his leg swelling. He was started on Lasix at the last visit however he developed a red itchy rash of bilateral legs after starting this. Since discontinuing the rash has resolved but his swelling has worsened. He's not had any recent steroid therapy. He denies any chest pain, shortness of breath. ER visit and labs reviewed  Review of Systems - per above  GEN- denies fatigue, fever, weight loss,weakness, recent illness HEENT- denies eye drainage, change in vision, nasal discharge, CVS- denies chest pain, palpitations, +leg swelling RESP- denies SOB, cough, wheeze ABD- denies N/V, change in stools, abd pain GU- denies dysuria, hematuria, dribbling, incontinence MSK- denies +pain, muscle aches, injury        Objective:   Physical Exam GEN- NAD, alert and oriented x3 HEENT- PERRL, EOMI, non injected sclera, pink conjunctiva, MMM, oropharynx clear Neck- Supple, no JVD CVS- RRR, no murmur RESP-CTAB ABD-NABS,soft,NT,ND, no hepatomegaly EXT- + 1+ pitting edema to shins Pulses- Radial 2+ DP difficult to palpate        Assessment & Plan:

## 2013-05-03 NOTE — Assessment & Plan Note (Addendum)
Worsening edema. Differentials include medication side effects such as amlodipine in the increase of gabapentin recently. Cardiovascular disease, renal or liver disease. His recent labs do not show any renal or liver disease. It is possible that he does have some underlying cardiovascular disease. I will change him to demadex 20 mg daily. I will stop the amlodipine he will followup on Friday. And we have had no significant change I will continue to change his diuretics an echocardiogram will be ordered

## 2013-05-03 NOTE — Patient Instructions (Addendum)
Hold amlodipine or norvasc (brand name) Do not take lasix Do not take HCTZ F/U Friday for recheck

## 2013-05-03 NOTE — Assessment & Plan Note (Signed)
His leg swelling has worsened. His blood pressure is a little above goal as well. Unfortunately with the swelling I will stop the amlodipine and see . if this is contributing

## 2013-05-07 ENCOUNTER — Encounter: Payer: Self-pay | Admitting: Family Medicine

## 2013-05-07 ENCOUNTER — Ambulatory Visit (INDEPENDENT_AMBULATORY_CARE_PROVIDER_SITE_OTHER): Payer: Medicare Other | Admitting: Family Medicine

## 2013-05-07 VITALS — BP 130/70 | HR 70 | Temp 97.0°F | Resp 18 | Wt 205.0 lb

## 2013-05-07 DIAGNOSIS — R609 Edema, unspecified: Secondary | ICD-10-CM

## 2013-05-07 DIAGNOSIS — R600 Localized edema: Secondary | ICD-10-CM

## 2013-05-07 LAB — BASIC METABOLIC PANEL
BUN: 12 mg/dL (ref 6–23)
Chloride: 98 mEq/L (ref 96–112)
Creat: 0.76 mg/dL (ref 0.50–1.35)
Glucose, Bld: 105 mg/dL — ABNORMAL HIGH (ref 70–99)

## 2013-05-07 MED ORDER — POTASSIUM CHLORIDE CRYS ER 10 MEQ PO TBCR: 10.0000 meq | EXTENDED_RELEASE_TABLET | Freq: Every day | ORAL | Status: DC

## 2013-05-07 NOTE — Progress Notes (Signed)
  Subjective:    Patient ID: Michael [name], male    DOB: [birth year], 67 y.o.   MRN: [id number]  HPI  Pt here to f/u leg edema , seen Monday, significant edema, started on demadex 69m due to rash with lasix. Has done well, legs have gone down some, now able to put on regular shoe, decreased pain in feet with ambulation. Urinating with medication  Review of Systems  GEN- denies fatigue, fever, weight loss,weakness, recent illness HEENT- denies eye drainage, change in vision, nasal discharge, CVS- denies chest pain, palpitations RESP- denies SOB, cough, wheeze Neuro- denies headache, dizziness, syncope, seizure activity      Objective:   Physical Exam GEN- NAD, alert and oriented x3 CVS- RRR, no murmur RESP-CTAB EXT- +pitting edema, improved since Monday, just above ankles Pulses- Radial 2+, DP equal bilat       Assessment & Plan:

## 2013-05-07 NOTE — Patient Instructions (Addendum)
2D echo to be done  We will call with lab results For the weekend take demadex 1 tablet twice a day, take the potassium with the morning dose Keep previous appt

## 2013-05-09 NOTE — Assessment & Plan Note (Addendum)
Improved but still present, increase to demadex 32m BID, with potassium as his K has been on low normal side CHeck renal function Obtain 2D Echo with his other co-morbidites

## 2013-05-13 ENCOUNTER — Ambulatory Visit (HOSPITAL_COMMUNITY)
Admission: RE | Admit: 2013-05-13 | Discharge: 2013-05-13 | Disposition: A | Discharge status: Home / Self Care | Payer: Medicare Other | Source: Ambulatory Visit | Attending: Family Medicine | Admitting: Family Medicine

## 2013-05-13 DIAGNOSIS — I517 Cardiomegaly: Secondary | ICD-10-CM

## 2013-05-13 DIAGNOSIS — E119 Type 2 diabetes mellitus without complications: Secondary | ICD-10-CM | POA: Insufficient documentation

## 2013-05-13 DIAGNOSIS — E785 Hyperlipidemia, unspecified: Secondary | ICD-10-CM | POA: Insufficient documentation

## 2013-05-13 DIAGNOSIS — I10 Essential (primary) hypertension: Secondary | ICD-10-CM | POA: Insufficient documentation

## 2013-05-13 DIAGNOSIS — R609 Edema, unspecified: Secondary | ICD-10-CM | POA: Insufficient documentation

## 2013-05-13 NOTE — Progress Notes (Signed)
*  PRELIMINARY RESULTS* Echocardiogram 2D Echocardiogram has been performed.  Michael [name] 05/13/2013, 1:11 PM

## 2013-05-17 ENCOUNTER — Ambulatory Visit: Payer: Medicare Other | Admitting: Gastroenterology

## 2013-05-25 ENCOUNTER — Ambulatory Visit: Payer: Medicare Other | Admitting: Orthopedic Surgery

## 2013-06-01 ENCOUNTER — Telehealth: Payer: Self-pay | Admitting: Family Medicine

## 2013-06-01 ENCOUNTER — Other Ambulatory Visit: Payer: Self-pay | Admitting: *Deleted

## 2013-06-01 DIAGNOSIS — M5137 Other intervertebral disc degeneration, lumbosacral region: Secondary | ICD-10-CM

## 2013-06-01 MED ORDER — METFORMIN HCL 1000 MG PO TABS: 1000.0000 mg | ORAL_TABLET | Freq: Two times a day (BID) | ORAL | Status: DC

## 2013-06-01 MED ORDER — GABAPENTIN 400 MG PO CAPS: 400.0000 mg | ORAL_CAPSULE | Freq: Three times a day (TID) | ORAL | Status: DC

## 2013-06-01 NOTE — Telephone Encounter (Signed)
Meds refilled.

## 2013-06-01 NOTE — Telephone Encounter (Signed)
Metformin HCL 1000 mg tab 1 BID #60

## 2013-06-07 ENCOUNTER — Ambulatory Visit: Payer: Medicare Other | Admitting: Gastroenterology

## 2013-06-11 ENCOUNTER — Telehealth: Payer: Self-pay | Admitting: Family Medicine

## 2013-06-11 MED ORDER — TRAMADOL HCL 50 MG PO TABS: ORAL_TABLET | ORAL | Status: DC

## 2013-06-11 NOTE — Telephone Encounter (Signed)
Okay to refill? 

## 2013-06-11 NOTE — Telephone Encounter (Signed)
meds refilled 

## 2013-06-15 ENCOUNTER — Ambulatory Visit (INDEPENDENT_AMBULATORY_CARE_PROVIDER_SITE_OTHER): Payer: Medicare Other | Admitting: Orthopedic Surgery

## 2013-06-15 ENCOUNTER — Encounter: Payer: Self-pay | Admitting: Orthopedic Surgery

## 2013-06-15 VITALS — BP 170/81 | Ht 63.0 in | Wt 211.0 lb

## 2013-06-15 DIAGNOSIS — M5126 Other intervertebral disc displacement, lumbar region: Secondary | ICD-10-CM

## 2013-06-15 DIAGNOSIS — F40298 Other specified phobia: Secondary | ICD-10-CM

## 2013-06-15 DIAGNOSIS — F40240 Claustrophobia: Secondary | ICD-10-CM

## 2013-06-15 MED ORDER — DIAZEPAM 10 MG PO TABS: 10.0000 mg | ORAL_TABLET | Freq: Once | ORAL | Status: DC

## 2013-06-15 MED ORDER — PREDNISONE (PAK) 10 MG PO TABS: 10.0000 mg | ORAL_TABLET | Freq: Every day | ORAL | Status: DC

## 2013-06-15 NOTE — Progress Notes (Signed)
Patient ID: Michael [name], male   DOB: 12/12/1945, 67 y.o.   MRN: [id number]  Chief Complaint  Patient presents with  . Follow-up    6 week recheck left thigh pain    BP 170/81  Ht 5' 3"  (1.6 m)  Wt 211 lb (95.709 kg)  BMI 37.39 kg/m2   The patient is complaining of left thigh pain some mild left knee pain. His findings from operation are noted and reviewed. He said the pain started just after surgery he thought it might have been the way was walking but it has persisted and gotten worse. He is a diabetic. He had a back x-ray in 2003 which showed degenerative disc disease at L3 and 4 Summit L4 and 5. He was having some back pain back at that time he denies back pain at this time but the pain in his left thigh has become severe   Archie Balboa tells me that he has not improved after gabapentin.  His bowel and bladder function remain intact he still has a lateral leg pain with numbness and tingling  General appearance is normal, the patient is alert and oriented x3 with normal mood and affect. BP 170/81  Ht 5' 3"  (1.6 m)  Wt 211 lb (95.709 kg)  BMI 37.39 kg/m2 Is walking with a cane he has a mild limp his motor function remains normal.  Encounter Diagnoses  Name Primary?  . Claustrophobia   . HNP (herniated nucleus pulposus), lumbar Yes   Recommend MRI lumbar spine in preparation for epidural steroid injections  Sterapred double strength Dosepak in the interim  Followup after MRI to review results

## 2013-06-15 NOTE — Patient Instructions (Addendum)
MRI  Start prednisone dose pack

## 2013-06-16 ENCOUNTER — Telehealth: Payer: Self-pay | Admitting: *Deleted

## 2013-06-16 NOTE — Telephone Encounter (Signed)
MRI lumbar spine without contrast CPT code [id number] Authr# [id number] valid for 45 calendar days expires 07/31/2013 per Malachy Mood B. Follow up with Dr. Aline Brochure 07/06/13 at 11:00 am Patient is aware of dates and times

## 2013-06-21 ENCOUNTER — Ambulatory Visit (HOSPITAL_COMMUNITY)
Admission: RE | Admit: 2013-06-21 | Discharge: 2013-06-21 | Disposition: A | Discharge status: Home / Self Care | Payer: Medicare Other | Source: Ambulatory Visit | Attending: Orthopedic Surgery | Admitting: Orthopedic Surgery

## 2013-06-21 ENCOUNTER — Ambulatory Visit (HOSPITAL_COMMUNITY): Payer: Medicare Other

## 2013-06-21 DIAGNOSIS — R209 Unspecified disturbances of skin sensation: Secondary | ICD-10-CM | POA: Insufficient documentation

## 2013-06-21 DIAGNOSIS — M5126 Other intervertebral disc displacement, lumbar region: Secondary | ICD-10-CM | POA: Insufficient documentation

## 2013-06-21 DIAGNOSIS — R5381 Other malaise: Secondary | ICD-10-CM | POA: Insufficient documentation

## 2013-06-21 DIAGNOSIS — M545 Low back pain, unspecified: Secondary | ICD-10-CM | POA: Insufficient documentation

## 2013-06-21 DIAGNOSIS — M48061 Spinal stenosis, lumbar region without neurogenic claudication: Secondary | ICD-10-CM | POA: Insufficient documentation

## 2013-06-28 ENCOUNTER — Telehealth: Payer: Self-pay | Admitting: Family Medicine

## 2013-06-28 ENCOUNTER — Other Ambulatory Visit: Payer: Self-pay | Admitting: Family Medicine

## 2013-06-28 NOTE — Telephone Encounter (Signed)
Meds refilled.

## 2013-06-28 NOTE — Telephone Encounter (Signed)
Novolog Mix 70-30 vial inject 0.23 mLs into the skin BID #10.0 mL BD Insulin Syr 0.5 mL [id number] use 23 units BID #100

## 2013-06-29 MED ORDER — INSULIN ASPART PROT & ASPART (70-30 MIX) 100 UNIT/ML ~~LOC~~ SUSP: 23.0000 [IU] | Freq: Two times a day (BID) | SUBCUTANEOUS | Status: DC

## 2013-06-29 MED ORDER — NEEDLES & SYRINGES MISC: 23.0000 [IU] | Freq: Two times a day (BID) | Status: DC

## 2013-06-29 NOTE — Telephone Encounter (Signed)
Meds refilled.

## 2013-07-06 ENCOUNTER — Ambulatory Visit (INDEPENDENT_AMBULATORY_CARE_PROVIDER_SITE_OTHER): Payer: Medicare Other | Admitting: Orthopedic Surgery

## 2013-07-06 ENCOUNTER — Encounter: Payer: Self-pay | Admitting: Orthopedic Surgery

## 2013-07-06 VITALS — BP 170/75

## 2013-07-06 DIAGNOSIS — M48061 Spinal stenosis, lumbar region without neurogenic claudication: Secondary | ICD-10-CM | POA: Insufficient documentation

## 2013-07-06 MED ORDER — HYDROCODONE-ACETAMINOPHEN 5-325 MG PO TABS: ORAL_TABLET | ORAL | Status: DC

## 2013-07-06 NOTE — Patient Instructions (Addendum)
Refer to Neurosurgeon for Spinal Stenosis

## 2013-07-06 NOTE — Progress Notes (Signed)
Patient ID: Michael [name], male   DOB: 1946/02/08, 67 y.o.   MRN: [id number]  MRI F/U back and leg pain   The patient reports per persistent symptoms of pain in his left leg radiating from his back with weakness and aching leg pain despite medical management with Norco, NSAIDs. Had knee arthroscopy with arthritic symptoms noted in the left knee.  He does not complain of bowel or bladder symptoms at this time he has no perineal numbness or tingling  MRI findings are reviewed and listed below and I agree with the report  L2-3: Moderate bulge/ broad-based protrusion combined with mild facet joint degenerative changes and slightly short pedicles causes baseline moderate spinal stenosis. Superimposed left paracentral/ posterior lateral small focal protrusion with mild cephalad extension elevating the posterior longitudinal ligament further compresses the left aspect of the thecal sac.   L3-4: Bulge/broad-based protrusion and spur. Slightly short pedicles. Multifactorial mild spinal stenosis. Lateral extension disk approaches the exiting right L3 nerve root which is not compressed.   L4-5: Mild to moderate facet joint degenerative changes. Bulge. Short pedicles. Multifactorial mild spinal stenosis.   L5-S1: Moderate facet joint degenerative changes. Minimal bulge. No significant spinal stenosis or foraminal narrowing.   IMPRESSION: Degenerative changes and subsequent spinal stenosis most notable on The patient is complaining of left thigh pain some mild left knee pain. His findings from operation are noted and reviewed. He said the pain started just after surgery he thought it might have been the way was walking but it has persisted and gotten worse. He is a diabetic. He had a back x-ray in 2003 which showed degenerative disc disease at L3 and 4 Summit L4 and 5. He was having some back pain back at that time he denies back pain at this time but the pain in his left thigh has become  severe  the left at the L2-3 level as detailed above.   Altered signal intensity of bone marrow most notable L3 and L1 vertebral body. Follow-up as discussed above.  I recommend he see a neurosurgeon for further evaluation is made in management. This is not part of my practice of orthopedics  Meds ordered this encounter  Medications  . HYDROcodone-acetaminophen (NORCO/VICODIN) 5-325 MG per tablet    Sig: TAKE 1 TABLET EVERY 4 HOURS AS NEEDED FOR PAIN    Dispense:  120 tablet    Refill:  0

## 2013-07-07 ENCOUNTER — Other Ambulatory Visit: Payer: Self-pay | Admitting: *Deleted

## 2013-07-07 ENCOUNTER — Telehealth: Payer: Self-pay | Admitting: *Deleted

## 2013-07-07 DIAGNOSIS — M48061 Spinal stenosis, lumbar region without neurogenic claudication: Secondary | ICD-10-CM

## 2013-07-07 NOTE — Telephone Encounter (Signed)
Faxed referral and office notes to [facility]. Awaiting appointment.

## 2013-07-12 ENCOUNTER — Other Ambulatory Visit: Payer: Self-pay | Admitting: *Deleted

## 2013-07-12 DIAGNOSIS — M7650 Patellar tendinitis, unspecified knee: Secondary | ICD-10-CM

## 2013-07-14 ENCOUNTER — Ambulatory Visit (INDEPENDENT_AMBULATORY_CARE_PROVIDER_SITE_OTHER): Payer: Medicare Other | Admitting: Family Medicine

## 2013-07-14 ENCOUNTER — Encounter: Payer: Self-pay | Admitting: Family Medicine

## 2013-07-14 VITALS — BP 136/70 | HR 68 | Temp 98.3°F | Resp 18 | Wt 212.0 lb

## 2013-07-14 DIAGNOSIS — Z23 Encounter for immunization: Secondary | ICD-10-CM

## 2013-07-14 DIAGNOSIS — D649 Anemia, unspecified: Secondary | ICD-10-CM | POA: Insufficient documentation

## 2013-07-14 DIAGNOSIS — M48061 Spinal stenosis, lumbar region without neurogenic claudication: Secondary | ICD-10-CM

## 2013-07-14 DIAGNOSIS — M5137 Other intervertebral disc degeneration, lumbosacral region: Secondary | ICD-10-CM

## 2013-07-14 DIAGNOSIS — R609 Edema, unspecified: Secondary | ICD-10-CM

## 2013-07-14 DIAGNOSIS — I10 Essential (primary) hypertension: Secondary | ICD-10-CM

## 2013-07-14 DIAGNOSIS — E119 Type 2 diabetes mellitus without complications: Secondary | ICD-10-CM

## 2013-07-14 LAB — CBC WITH DIFFERENTIAL/PLATELET
Basophils Absolute: 0.1 10*3/uL (ref 0.0–0.1)
Basophils Relative: 2 % — ABNORMAL HIGH (ref 0–1)
Eosinophils Absolute: 0.2 10*3/uL (ref 0.0–0.7)
HCT: 32.3 % — ABNORMAL LOW (ref 39.0–52.0)
MCH: 22.6 pg — ABNORMAL LOW (ref 26.0–34.0)
MCHC: 31.3 g/dL (ref 30.0–36.0)
Monocytes Absolute: 0.3 10*3/uL (ref 0.1–1.0)
Monocytes Relative: 8 % (ref 3–12)
Neutro Abs: 2.1 10*3/uL (ref 1.7–7.7)
RDW: 18 % — ABNORMAL HIGH (ref 11.5–15.5)

## 2013-07-14 LAB — HEMOGLOBIN A1C
Hgb A1c MFr Bld: 7.3 % — ABNORMAL HIGH (ref <5.7)
Mean Plasma Glucose: 163 mg/dL — ABNORMAL HIGH (ref <117)

## 2013-07-14 MED ORDER — GABAPENTIN 400 MG PO CAPS: 400.0000 mg | ORAL_CAPSULE | Freq: Three times a day (TID) | ORAL | Status: DC

## 2013-07-14 MED ORDER — AMLODIPINE BESYLATE 10 MG PO TABS: 10.0000 mg | ORAL_TABLET | Freq: Every day | ORAL | Status: DC

## 2013-07-14 NOTE — Assessment & Plan Note (Addendum)
He is taking iron tablets once a day. He put off a gastroenterology visit because of the co-pay , denies any active bleeding. I will recheck his CBC today as well as his iron level

## 2013-07-14 NOTE — Assessment & Plan Note (Signed)
Would check his A1c today continue insulin as well as oral medications to

## 2013-07-14 NOTE — Patient Instructions (Addendum)
Continue current medications We will call with lab results Take a water pill and potassium today Script for shingles vaccine given Flu shot given F/U 3 months

## 2013-07-14 NOTE — Progress Notes (Signed)
  Subjective:    Patient ID: Michael [name], male    DOB: 1945/12/21, 67 y.o.   MRN: [id number]  HPI  Patient here to follow chronic medical problems. Diabetes mellitus he recently was on a steroid dose pack at that time his blood sugars were elevated now his fastings are back down to 140s. He is due for A1c checked today no hypoglycemia  Back pain he was seen by his orthopedic surgeon regarding continued thigh pain MRI of the back was then done which showed multiple disc bulge and some spinal stenosis therefore he is been referred to neurosurgery for evaluation treatment. He was given prescription for pain medication by orthopedics he also uses Ultram as needed.  Leg swelling his legs have been doing well last night he ate some hot dogs and states this morning they're a little swollen he does not use any water pill  He requests shingles vaccine prescription   Review of Systems  GEN- denies fatigue, fever, weight loss,weakness, recent illness HEENT- denies eye drainage, change in vision, nasal discharge, CVS- denies chest pain, palpitations RESP- denies SOB, cough, wheeze ABD- denies N/V, change in stools, abd pain GU- denies dysuria, hematuria, dribbling, incontinence MSK- +joint pain, muscle aches, injury Neuro- denies headache, dizziness, syncope, seizure activity      Objective:   Physical Exam GEN- NAD, alert and oriented x3 HEENT- PERRL, EOMI, non injected sclera, pink conjunctiva, MMM, oropharynx clear Neck- Supple,  CVS- RRR, no murmur RESP-CTAB EXT- + pedal edema Pulses- Radial 2+ DP difficult to palpate      Assessment & Plan:

## 2013-07-14 NOTE — Assessment & Plan Note (Signed)
Blood pressure is well-controlled no change the medications

## 2013-07-14 NOTE — Assessment & Plan Note (Signed)
Reviewed MRI and orthopedic note. We'll await neurosurgery appointment. We'll continue him on the gabapentin

## 2013-07-14 NOTE — Assessment & Plan Note (Signed)
Elevate feet, take dose of Demadex today, reiterated diet

## 2013-07-27 ENCOUNTER — Telehealth: Payer: Self-pay | Admitting: Family Medicine

## 2013-07-27 DIAGNOSIS — M48061 Spinal stenosis, lumbar region without neurogenic claudication: Secondary | ICD-10-CM

## 2013-07-27 NOTE — Telephone Encounter (Signed)
Okay to refill, let him know he wont be able to get from the pharmacy until Nov 7

## 2013-07-27 NOTE — Telephone Encounter (Signed)
Needs refill on Hydrocodone 5-337m, will pick up today

## 2013-07-28 MED ORDER — HYDROCODONE-ACETAMINOPHEN 5-325 MG PO TABS: ORAL_TABLET | ORAL | Status: DC

## 2013-07-28 NOTE — Telephone Encounter (Signed)
Rx printed and ready for signature and pick up

## 2013-07-29 ENCOUNTER — Telehealth: Payer: Self-pay | Admitting: Family Medicine

## 2013-07-29 NOTE — Telephone Encounter (Signed)
Patient is needing his Naproxen 375 mg, HCTZ  25 mg , ZOCOR 40 mg .Patient said that Dr. Buelah Manis had changed is SIG for his Novolog and the Pharmacy is needing a new script for it with new SIG:   [facility]  [place]. [phone]

## 2013-07-30 NOTE — Telephone Encounter (Signed)
Patient has an appointment with Dr. Luiz Ochoa on 08/04/13 at 9:45 am. Patient is aware.

## 2013-08-03 MED ORDER — INSULIN ASPART PROT & ASPART (70-30 MIX) 100 UNIT/ML ~~LOC~~ SUSP: 25.0000 [IU] | Freq: Two times a day (BID) | SUBCUTANEOUS | Status: DC

## 2013-08-03 MED ORDER — NAPROXEN 375 MG PO TABS: 375.0000 mg | ORAL_TABLET | Freq: Two times a day (BID) | ORAL | Status: DC

## 2013-08-03 MED ORDER — HYDROCHLOROTHIAZIDE 25 MG PO TABS: 25.0000 mg | ORAL_TABLET | Freq: Every day | ORAL | Status: DC

## 2013-08-03 NOTE — Telephone Encounter (Signed)
Medication refilled per protocol. 

## 2013-08-06 ENCOUNTER — Other Ambulatory Visit: Payer: Self-pay | Admitting: Neurosurgery

## 2013-08-13 ENCOUNTER — Telehealth: Payer: Self-pay | Admitting: *Deleted

## 2013-08-13 MED ORDER — SIMVASTATIN 40 MG PO TABS: 20.0000 mg | ORAL_TABLET | Freq: Every evening | ORAL | Status: DC

## 2013-08-13 NOTE — Telephone Encounter (Signed)
Pharmacy called stating that pt needed refill on Simvastatin, medication refilled

## 2013-08-18 ENCOUNTER — Encounter (HOSPITAL_COMMUNITY): Payer: Self-pay | Admitting: Pharmacy Technician

## 2013-08-23 NOTE — Pre-Procedure Instructions (Signed)
JUNIEL GROENE  08/23/2013   Your procedure is scheduled on:  Mon, Dec 1 @ 7:30 AM  Report to [facility] at 5:30 AM.  Call this number if you have problems the morning of surgery: [phone]   Remember:   Do not eat food or drink liquids after midnight.   Take these medicines the morning of surgery with A SIP OF WATER: Amlodipine(Norvasc),Gabapentin(Neurontin),Pain Pill(if needed),and Omeprazole(Prilosec)              Stop taking your Aleve and Aspirin.No Goody's,BC's,Ibuprofen,Fish Oil,or any Herbal Medications   Do not wear jewelry  Do not wear lotions, powders, or colognes. You may wear deodorant.  Men may shave face and neck.  Do not bring valuables to the hospital.  [facility] is not responsible                  for any belongings or valuables.               Contacts, dentures or bridgework may not be worn into surgery.  Leave suitcase in the car. After surgery it may be brought to your room.  For patients admitted to the hospital, discharge time is determined by your                treatment team.               Patients discharged the day of surgery will not be allowed to drive  home.    Special Instructions: Shower using CHG 2 nights before surgery and the night before surgery.  If you shower the day of surgery use CHG.  Use special wash - you have one bottle of CHG for all showers.  You should use approximately 1/3 of the bottle for each shower.   Please read over the following fact sheets that you were given: Pain Booklet, Coughing and Deep Breathing, MRSA Information and Surgical Site Infection Prevention

## 2013-08-24 ENCOUNTER — Encounter (HOSPITAL_COMMUNITY)
Admission: RE | Admit: 2013-08-24 | Discharge: 2013-08-24 | Disposition: A | Discharge status: Home / Self Care | Payer: Medicare Other | Source: Ambulatory Visit | Attending: Neurosurgery | Admitting: Neurosurgery

## 2013-08-24 ENCOUNTER — Encounter (HOSPITAL_COMMUNITY): Payer: Self-pay

## 2013-08-24 DIAGNOSIS — Z01818 Encounter for other preprocedural examination: Secondary | ICD-10-CM | POA: Insufficient documentation

## 2013-08-24 DIAGNOSIS — Z01812 Encounter for preprocedural laboratory examination: Secondary | ICD-10-CM | POA: Insufficient documentation

## 2013-08-24 HISTORY — DX: Unspecified osteoarthritis, unspecified site: M19.90

## 2013-08-24 LAB — BASIC METABOLIC PANEL
BUN: 10 mg/dL (ref 6–23)
CO2: 26 mEq/L (ref 19–32)
Chloride: 92 mEq/L — ABNORMAL LOW (ref 96–112)
Creatinine, Ser: 0.62 mg/dL (ref 0.50–1.35)

## 2013-08-24 LAB — CBC WITH DIFFERENTIAL/PLATELET
Basophils Absolute: 0.1 10*3/uL (ref 0.0–0.1)
Basophils Relative: 2 % — ABNORMAL HIGH (ref 0–1)
Eosinophils Relative: 3 % (ref 0–5)
HCT: 32.2 % — ABNORMAL LOW (ref 39.0–52.0)
Hemoglobin: 10.6 g/dL — ABNORMAL LOW (ref 13.0–17.0)
Lymphs Abs: 2 10*3/uL (ref 0.7–4.0)
MCHC: 32.9 g/dL (ref 30.0–36.0)
MCV: 71.1 fL — ABNORMAL LOW (ref 78.0–100.0)
Monocytes Absolute: 0.5 10*3/uL (ref 0.1–1.0)
Monocytes Relative: 7 % (ref 3–12)
Neutro Abs: 4.1 10*3/uL (ref 1.7–7.7)
RDW: 15.2 % (ref 11.5–15.5)
WBC: 6.9 10*3/uL (ref 4.0–10.5)

## 2013-08-24 LAB — URINALYSIS, ROUTINE W REFLEX MICROSCOPIC
Bilirubin Urine: NEGATIVE
Glucose, UA: NEGATIVE mg/dL
Hgb urine dipstick: NEGATIVE
Specific Gravity, Urine: 1.007 (ref 1.005–1.030)
pH: 6 (ref 5.0–8.0)

## 2013-08-24 LAB — SURGICAL PCR SCREEN
MRSA, PCR: NEGATIVE
Staphylococcus aureus: NEGATIVE

## 2013-08-29 MED ORDER — CEFAZOLIN SODIUM-DEXTROSE 2-3 GM-% IV SOLR
2.0000 g | INTRAVENOUS | Status: AC
  Administered 2013-08-30: 2 g via INTRAVENOUS
  Filled 2013-08-29: qty 50

## 2013-08-30 ENCOUNTER — Encounter (HOSPITAL_COMMUNITY): Payer: Self-pay | Admitting: *Deleted

## 2013-08-30 ENCOUNTER — Ambulatory Visit (HOSPITAL_COMMUNITY)
Admission: RE | Admit: 2013-08-30 | Discharge: 2013-08-30 | Disposition: A | Discharge status: Home / Self Care | Payer: Medicare Other | Source: Ambulatory Visit | Attending: Neurosurgery | Admitting: Neurosurgery

## 2013-08-30 ENCOUNTER — Encounter (HOSPITAL_COMMUNITY)
Admission: RE | Disposition: A | Discharge status: Home / Self Care | Payer: Self-pay | Source: Ambulatory Visit | Attending: Neurosurgery

## 2013-08-30 ENCOUNTER — Ambulatory Visit (HOSPITAL_COMMUNITY): Payer: Medicare Other | Admitting: Anesthesiology

## 2013-08-30 ENCOUNTER — Encounter (HOSPITAL_COMMUNITY): Payer: Medicare Other | Admitting: Anesthesiology

## 2013-08-30 ENCOUNTER — Ambulatory Visit (HOSPITAL_COMMUNITY): Payer: Medicare Other

## 2013-08-30 DIAGNOSIS — M5126 Other intervertebral disc displacement, lumbar region: Secondary | ICD-10-CM | POA: Insufficient documentation

## 2013-08-30 HISTORY — PX: LUMBAR LAMINECTOMY/DECOMPRESSION MICRODISCECTOMY: SHX5026

## 2013-08-30 LAB — GLUCOSE, CAPILLARY
Glucose-Capillary: 115 mg/dL — ABNORMAL HIGH (ref 70–99)
Glucose-Capillary: 155 mg/dL — ABNORMAL HIGH (ref 70–99)

## 2013-08-30 SURGERY — LUMBAR LAMINECTOMY/DECOMPRESSION MICRODISCECTOMY 1 LEVEL
Anesthesia: General | Laterality: Left | Wound class: Clean

## 2013-08-30 MED ORDER — FENTANYL CITRATE 0.05 MG/ML IJ SOLN: 50.0000 ug | Freq: Once | INTRAMUSCULAR | Status: DC

## 2013-08-30 MED ORDER — KETOROLAC TROMETHAMINE 30 MG/ML IJ SOLN: 30.0000 mg | Freq: Four times a day (QID) | INTRAMUSCULAR | Status: DC

## 2013-08-30 MED ORDER — ARTIFICIAL TEARS OP OINT
TOPICAL_OINTMENT | OPHTHALMIC | Status: DC | PRN
  Administered 2013-08-30: 1 via OPHTHALMIC

## 2013-08-30 MED ORDER — HYDROCODONE-ACETAMINOPHEN 5-325 MG PO TABS: 1.0000 | ORAL_TABLET | ORAL | Status: DC | PRN

## 2013-08-30 MED ORDER — FENTANYL CITRATE 0.05 MG/ML IJ SOLN
INTRAMUSCULAR | Status: DC | PRN
  Administered 2013-08-30 (×3): 50 ug via INTRAVENOUS

## 2013-08-30 MED ORDER — SIMVASTATIN 20 MG PO TABS
20.0000 mg | ORAL_TABLET | Freq: Every evening | ORAL | Status: DC
  Filled 2013-08-30: qty 1

## 2013-08-30 MED ORDER — BISACODYL 10 MG RE SUPP: 10.0000 mg | Freq: Every day | RECTAL | Status: DC | PRN

## 2013-08-30 MED ORDER — ASPIRIN EC 81 MG PO TBEC
81.0000 mg | DELAYED_RELEASE_TABLET | Freq: Every day | ORAL | Status: DC
  Administered 2013-08-30: 81 mg via ORAL
  Filled 2013-08-30: qty 1

## 2013-08-30 MED ORDER — NEOSTIGMINE METHYLSULFATE 1 MG/ML IJ SOLN
INTRAMUSCULAR | Status: DC | PRN
  Administered 2013-08-30: 3 mg via INTRAVENOUS

## 2013-08-30 MED ORDER — GABAPENTIN 400 MG PO CAPS
400.0000 mg | ORAL_CAPSULE | Freq: Two times a day (BID) | ORAL | Status: DC
  Administered 2013-08-30: 400 mg via ORAL
  Filled 2013-08-30 (×2): qty 1

## 2013-08-30 MED ORDER — METFORMIN HCL 500 MG PO TABS
1000.0000 mg | ORAL_TABLET | Freq: Two times a day (BID) | ORAL | Status: DC
  Filled 2013-08-30 (×2): qty 2

## 2013-08-30 MED ORDER — ACETAMINOPHEN 650 MG RE SUPP: 650.0000 mg | RECTAL | Status: DC | PRN

## 2013-08-30 MED ORDER — GLYCOPYRROLATE 0.2 MG/ML IJ SOLN
INTRAMUSCULAR | Status: DC | PRN
  Administered 2013-08-30: 0.4 mg via INTRAVENOUS

## 2013-08-30 MED ORDER — KETOROLAC TROMETHAMINE 30 MG/ML IJ SOLN
INTRAMUSCULAR | Status: AC
  Filled 2013-08-30: qty 1

## 2013-08-30 MED ORDER — LIDOCAINE-EPINEPHRINE 1 %-1:100000 IJ SOLN
INTRAMUSCULAR | Status: DC | PRN
  Administered 2013-08-30: 20 mL

## 2013-08-30 MED ORDER — SODIUM CHLORIDE 0.9 % IJ SOLN: 3.0000 mL | Freq: Two times a day (BID) | INTRAMUSCULAR | Status: DC

## 2013-08-30 MED ORDER — TORSEMIDE 20 MG PO TABS
20.0000 mg | ORAL_TABLET | Freq: Every day | ORAL | Status: DC | PRN
  Filled 2013-08-30: qty 1

## 2013-08-30 MED ORDER — INSULIN ASPART PROT & ASPART (70-30 MIX) 100 UNIT/ML ~~LOC~~ SUSP
25.0000 [IU] | Freq: Two times a day (BID) | SUBCUTANEOUS | Status: DC
  Filled 2013-08-30: qty 10

## 2013-08-30 MED ORDER — PROMETHAZINE HCL 25 MG/ML IJ SOLN: 6.2500 mg | INTRAMUSCULAR | Status: DC | PRN

## 2013-08-30 MED ORDER — PANTOPRAZOLE SODIUM 40 MG PO TBEC: 40.0000 mg | DELAYED_RELEASE_TABLET | Freq: Every day | ORAL | Status: DC

## 2013-08-30 MED ORDER — AMLODIPINE BESYLATE 10 MG PO TABS: 10.0000 mg | ORAL_TABLET | Freq: Every day | ORAL | Status: DC

## 2013-08-30 MED ORDER — GLIPIZIDE 10 MG PO TABS
10.0000 mg | ORAL_TABLET | Freq: Two times a day (BID) | ORAL | Status: DC
  Filled 2013-08-30 (×2): qty 1

## 2013-08-30 MED ORDER — HEMOSTATIC AGENTS (NO CHARGE) OPTIME
TOPICAL | Status: DC | PRN
  Administered 2013-08-30: 1 via TOPICAL

## 2013-08-30 MED ORDER — HYDROMORPHONE HCL PF 1 MG/ML IJ SOLN: 0.2500 mg | INTRAMUSCULAR | Status: DC | PRN

## 2013-08-30 MED ORDER — ONDANSETRON HCL 4 MG/2ML IJ SOLN
INTRAMUSCULAR | Status: DC | PRN
  Administered 2013-08-30: 4 mg via INTRAVENOUS

## 2013-08-30 MED ORDER — ONDANSETRON HCL 4 MG/2ML IJ SOLN: 4.0000 mg | INTRAMUSCULAR | Status: DC | PRN

## 2013-08-30 MED ORDER — KETOROLAC TROMETHAMINE 30 MG/ML IJ SOLN
30.0000 mg | Freq: Four times a day (QID) | INTRAMUSCULAR | Status: DC
  Filled 2013-08-30: qty 1

## 2013-08-30 MED ORDER — INSULIN ASPART 100 UNIT/ML ~~LOC~~ SOLN
0.0000 [IU] | SUBCUTANEOUS | Status: DC
  Administered 2013-08-30: 2 [IU] via SUBCUTANEOUS

## 2013-08-30 MED ORDER — METHOCARBAMOL 100 MG/ML IJ SOLN: 500.0000 mg | Freq: Four times a day (QID) | INTRAVENOUS | Status: DC | PRN

## 2013-08-30 MED ORDER — LACTATED RINGERS IV SOLN
INTRAVENOUS | Status: DC | PRN
  Administered 2013-08-30 (×2): via INTRAVENOUS

## 2013-08-30 MED ORDER — SODIUM CHLORIDE 0.9 % IR SOLN
Status: DC | PRN
  Administered 2013-08-30: 07:00:00

## 2013-08-30 MED ORDER — OXYCODONE-ACETAMINOPHEN 5-325 MG PO TABS
1.0000 | ORAL_TABLET | ORAL | Status: DC | PRN
  Administered 2013-08-30: 2 via ORAL
  Filled 2013-08-30: qty 2

## 2013-08-30 MED ORDER — LIDOCAINE HCL (CARDIAC) 20 MG/ML IV SOLN
INTRAVENOUS | Status: DC | PRN
  Administered 2013-08-30: 100 mg via INTRAVENOUS

## 2013-08-30 MED ORDER — METHOCARBAMOL 500 MG PO TABS: 500.0000 mg | ORAL_TABLET | Freq: Four times a day (QID) | ORAL | Status: DC | PRN

## 2013-08-30 MED ORDER — MORPHINE SULFATE 2 MG/ML IJ SOLN: 1.0000 mg | INTRAMUSCULAR | Status: DC | PRN

## 2013-08-30 MED ORDER — DOCUSATE SODIUM 100 MG PO CAPS
100.0000 mg | ORAL_CAPSULE | Freq: Two times a day (BID) | ORAL | Status: DC
  Administered 2013-08-30: 100 mg via ORAL
  Filled 2013-08-30 (×2): qty 1

## 2013-08-30 MED ORDER — PROPOFOL 10 MG/ML IV BOLUS
INTRAVENOUS | Status: DC | PRN
  Administered 2013-08-30: 160 mg via INTRAVENOUS

## 2013-08-30 MED ORDER — CYCLOBENZAPRINE HCL 10 MG PO TABS: 10.0000 mg | ORAL_TABLET | Freq: Three times a day (TID) | ORAL | Status: DC | PRN

## 2013-08-30 MED ORDER — MAGNESIUM HYDROXIDE 400 MG/5ML PO SUSP: 30.0000 mL | Freq: Every day | ORAL | Status: DC | PRN

## 2013-08-30 MED ORDER — HYDROCHLOROTHIAZIDE 25 MG PO TABS
25.0000 mg | ORAL_TABLET | Freq: Every day | ORAL | Status: DC
  Administered 2013-08-30: 25 mg via ORAL
  Filled 2013-08-30: qty 1

## 2013-08-30 MED ORDER — OXYCODONE HCL 5 MG PO TABS: 5.0000 mg | ORAL_TABLET | Freq: Once | ORAL | Status: DC | PRN

## 2013-08-30 MED ORDER — MIDAZOLAM HCL 2 MG/2ML IJ SOLN: 1.0000 mg | INTRAMUSCULAR | Status: DC | PRN

## 2013-08-30 MED ORDER — PROMETHAZINE HCL 25 MG/ML IJ SOLN: 12.5000 mg | INTRAMUSCULAR | Status: DC | PRN

## 2013-08-30 MED ORDER — OXYCODONE HCL 5 MG/5ML PO SOLN: 5.0000 mg | Freq: Once | ORAL | Status: DC | PRN

## 2013-08-30 MED ORDER — THROMBIN 5000 UNITS EX SOLR
CUTANEOUS | Status: DC | PRN
  Administered 2013-08-30 (×2): 5000 [IU] via TOPICAL

## 2013-08-30 MED ORDER — PROMETHAZINE HCL 25 MG PO TABS: 12.5000 mg | ORAL_TABLET | ORAL | Status: DC | PRN

## 2013-08-30 MED ORDER — LACTATED RINGERS IV SOLN: INTRAVENOUS | Status: DC

## 2013-08-30 MED ORDER — POTASSIUM CHLORIDE CRYS ER 10 MEQ PO TBCR
10.0000 meq | EXTENDED_RELEASE_TABLET | Freq: Every day | ORAL | Status: DC | PRN
  Filled 2013-08-30: qty 1

## 2013-08-30 MED ORDER — 0.9 % SODIUM CHLORIDE (POUR BTL) OPTIME
TOPICAL | Status: DC | PRN
  Administered 2013-08-30: 1000 mL

## 2013-08-30 MED ORDER — MIDAZOLAM HCL 5 MG/5ML IJ SOLN
INTRAMUSCULAR | Status: DC | PRN
  Administered 2013-08-30: 2 mg via INTRAVENOUS

## 2013-08-30 MED ORDER — ACETAMINOPHEN 325 MG PO TABS: 650.0000 mg | ORAL_TABLET | ORAL | Status: DC | PRN

## 2013-08-30 MED ORDER — SODIUM CHLORIDE 0.9 % IJ SOLN: 3.0000 mL | INTRAMUSCULAR | Status: DC | PRN

## 2013-08-30 MED ORDER — ALBUTEROL SULFATE HFA 108 (90 BASE) MCG/ACT IN AERS
INHALATION_SPRAY | RESPIRATORY_TRACT | Status: DC | PRN
  Administered 2013-08-30 (×2): 4 via RESPIRATORY_TRACT

## 2013-08-30 MED ORDER — CEFAZOLIN SODIUM 1-5 GM-% IV SOLN
1.0000 g | Freq: Three times a day (TID) | INTRAVENOUS | Status: DC
  Filled 2013-08-30 (×2): qty 50

## 2013-08-30 MED ORDER — ROCURONIUM BROMIDE 100 MG/10ML IV SOLN
INTRAVENOUS | Status: DC | PRN
  Administered 2013-08-30: 50 mg via INTRAVENOUS

## 2013-08-30 SURGICAL SUPPLY — 51 items
APL SKNCLS STERI-STRIP NONHPOA (GAUZE/BANDAGES/DRESSINGS) ×1
BAG DECANTER FOR FLEXI CONT (MISCELLANEOUS) ×2 IMPLANT
BENZOIN TINCTURE PRP APPL 2/3 (GAUZE/BANDAGES/DRESSINGS) ×2 IMPLANT
BLADE SURG ROTATE 9660 (MISCELLANEOUS) IMPLANT
BUR ROUND FLUTED 5 RND (BURR) ×2 IMPLANT
CANISTER SUCT 3000ML (MISCELLANEOUS) ×2 IMPLANT
CONT SPEC 4OZ CLIKSEAL STRL BL (MISCELLANEOUS) IMPLANT
DRAPE LAPAROTOMY 100X72X124 (DRAPES) ×2 IMPLANT
DRAPE MICROSCOPE LEICA (MISCELLANEOUS) ×2 IMPLANT
DRAPE POUCH INSTRU U-SHP 10X18 (DRAPES) ×2 IMPLANT
DRAPE SURG 17X23 STRL (DRAPES) ×2 IMPLANT
DRESSING TELFA 8X3 (GAUZE/BANDAGES/DRESSINGS) ×2 IMPLANT
DURAPREP 26ML APPLICATOR (WOUND CARE) ×2 IMPLANT
ELECT REM PT RETURN 9FT ADLT (ELECTROSURGICAL) ×2
ELECTRODE REM PT RTRN 9FT ADLT (ELECTROSURGICAL) ×1 IMPLANT
GAUZE SPONGE 4X4 16PLY XRAY LF (GAUZE/BANDAGES/DRESSINGS) IMPLANT
GLOVE ECLIPSE 7.5 STRL STRAW (GLOVE) ×2 IMPLANT
GLOVE ECLIPSE 8.5 STRL (GLOVE) ×1 IMPLANT
GLOVE EXAM NITRILE LRG STRL (GLOVE) IMPLANT
GLOVE EXAM NITRILE MD LF STRL (GLOVE) IMPLANT
GLOVE EXAM NITRILE XL STR (GLOVE) IMPLANT
GLOVE EXAM NITRILE XS STR PU (GLOVE) IMPLANT
GLOVE INDICATOR 7.5 STRL GRN (GLOVE) ×2 IMPLANT
GLOVE SURG SS PI 7.0 STRL IVOR (GLOVE) ×4 IMPLANT
GOWN BRE IMP SLV AUR LG STRL (GOWN DISPOSABLE) ×2 IMPLANT
GOWN BRE IMP SLV AUR XL STRL (GOWN DISPOSABLE) ×2 IMPLANT
GOWN STRL REIN 2XL LVL4 (GOWN DISPOSABLE) IMPLANT
KIT BASIN OR (CUSTOM PROCEDURE TRAY) ×2 IMPLANT
KIT ROOM TURNOVER OR (KITS) ×2 IMPLANT
NDL HYPO 18GX1.5 BLUNT FILL (NEEDLE) IMPLANT
NEEDLE HYPO 18GX1.5 BLUNT FILL (NEEDLE) IMPLANT
NEEDLE HYPO 22GX1.5 SAFETY (NEEDLE) ×4 IMPLANT
NS IRRIG 1000ML POUR BTL (IV SOLUTION) ×2 IMPLANT
PACK LAMINECTOMY NEURO (CUSTOM PROCEDURE TRAY) ×2 IMPLANT
PAD ARMBOARD 7.5X6 YLW CONV (MISCELLANEOUS) ×6 IMPLANT
PATTIES SURGICAL .75X.75 (GAUZE/BANDAGES/DRESSINGS) ×2 IMPLANT
RUBBERBAND STERILE (MISCELLANEOUS) ×4 IMPLANT
SPONGE GAUZE 4X4 12PLY (GAUZE/BANDAGES/DRESSINGS) ×2 IMPLANT
SPONGE LAP 4X18 X RAY DECT (DISPOSABLE) IMPLANT
SPONGE SURGIFOAM ABS GEL SZ50 (HEMOSTASIS) ×2 IMPLANT
STRIP CLOSURE SKIN 1/2X4 (GAUZE/BANDAGES/DRESSINGS) ×2 IMPLANT
SUT PROLENE 6 0 BV (SUTURE) IMPLANT
SUT VIC AB 0 CT1 18XCR BRD8 (SUTURE) ×1 IMPLANT
SUT VIC AB 0 CT1 8-18 (SUTURE) ×2
SUT VIC AB 2-0 CP2 18 (SUTURE) ×2 IMPLANT
SUT VIC AB 3-0 SH 8-18 (SUTURE) ×2 IMPLANT
SYR 20ML ECCENTRIC (SYRINGE) ×2 IMPLANT
SYR 5ML LL (SYRINGE) IMPLANT
TOWEL OR 17X24 6PK STRL BLUE (TOWEL DISPOSABLE) ×2 IMPLANT
TOWEL OR 17X26 10 PK STRL BLUE (TOWEL DISPOSABLE) ×2 IMPLANT
WATER STERILE IRR 1000ML POUR (IV SOLUTION) ×2 IMPLANT

## 2013-08-30 NOTE — Interval H&P Note (Signed)
History and Physical Interval Note:  08/30/2013 8:07 AM  Michael [name]  has presented today for surgery, with the diagnosis of Lumbar hnp without myelopathy  The various methods of treatment have been discussed with the patient and family. After consideration of risks, benefits and other options for treatment, the patient has consented to  Procedure(s) with comments: LUMBAR LAMINECTOMY/DECOMPRESSION MICRODISCECTOMY 1 LEVEL (Left) - Left L2-3 Microdiskectomy as a surgical intervention .  The patient's history has been reviewed, patient examined, no change in status, stable for surgery.  I have reviewed the patient's chart and labs.  Questions were answered to the patient's satisfaction.     Verneda Hollopeter,[place] R

## 2013-08-30 NOTE — Anesthesia Postprocedure Evaluation (Signed)
  Anesthesia Post-op Note  Patient: Michael [name]  Procedure(s) Performed: Procedure(s): LUMBAR LAMINECTOMY/DECOMPRESSION MICRODISCECTOMY LEFT  LUMBAR TWO THREE (Left)  Patient Location: PACU  Anesthesia Type:General  Level of Consciousness: awake  Airway and Oxygen Therapy: Patient Spontanous Breathing  Post-op Pain: mild  Post-op Assessment: Post-op Vital signs reviewed, Patient's Cardiovascular Status Stable, Respiratory Function Stable, Patent Airway, No signs of Nausea or vomiting and Pain level controlled  Post-op Vital Signs: Reviewed and stable  Complications: No apparent anesthesia complications

## 2013-08-30 NOTE — Discharge Summary (Signed)
Physician Discharge Summary  Patient ID: Michael [name] MRN: [id number] DOB/AGE: 66-Jul-1947 66 y.o.  Admit date: 08/30/2013 Discharge date: 08/30/2013  Admission Diagnoses:Lumbar herniated nucleus pulposus without myelopathy, Left L2-3   Discharge Diagnoses: Lumbar herniated nucleus pulposus without myelopathy, Left L2-3  Active Problems:   HNP (herniated nucleus pulposus), lumbar   Discharged Condition: good  Hospital Course: pt admitted on day of surgery  - underwent procedure below  - pt doing well  Consults: None  Treatments: surgery: LUMBAR LAMINECTOMY/DECOMPRESSION MICRODISCECTOMY LEFT LUMBAR TWO THREE, microdisection   Discharge Exam: Blood pressure 138/63, pulse 86, temperature 97.3 F (36.3 C), temperature source Oral, resp. rate 16, SpO2 97.00%. Wound:c/d/i  Disposition: home   Future Appointments Provider Department Dept Phone   10/15/2013 9:00 AM [name], MD [facility] [phone]       Medication List         amLODipine 10 MG tablet  Commonly known as:  NORVASC  Take 1 tablet (10 mg total) by mouth daily.     aspirin EC 81 MG tablet  Take 81 mg by mouth daily.     diclofenac sodium 1 % Gel  Commonly known as:  VOLTAREN  Apply 1 application topically 4 (four) times daily.     gabapentin 400 MG capsule  Commonly known as:  NEURONTIN  Take 400 mg by mouth 2 (two) times daily.     glipiZIDE 5 MG tablet  Commonly known as:  GLUCOTROL  Take 10 mg by mouth 2 (two) times daily before a meal.     hydrochlorothiazide 25 MG tablet  Commonly known as:  HYDRODIURIL  Take 1 tablet (25 mg total) by mouth daily.     HYDROcodone-acetaminophen 5-325 MG per tablet  Commonly known as:  NORCO/VICODIN  Take 1 tablet by mouth every 4 (four) hours as needed for moderate pain.     insulin aspart protamine- aspart (70-30) 100 UNIT/ML injection  Commonly known as:  NOVOLOG MIX 70/30  Inject 0.25 mLs (25 Units total) into the skin 2 (two)  times daily with a meal.     metFORMIN 1000 MG tablet  Commonly known as:  GLUCOPHAGE  Take 1 tablet (1,000 mg total) by mouth 2 (two) times daily with a meal.     naproxen 375 MG tablet  Commonly known as:  NAPROSYN  Take 1 tablet (375 mg total) by mouth 2 (two) times daily with a meal.     omeprazole 20 MG capsule  Commonly known as:  PRILOSEC  Take 1 capsule (20 mg total) by mouth 2 (two) times daily.     potassium chloride 10 MEQ tablet  Commonly known as:  K-DUR,KLOR-CON  Take 10 mEq by mouth daily as needed (when taking torsemide).     simvastatin 40 MG tablet  Commonly known as:  ZOCOR  Take 0.5 tablets (20 mg total) by mouth every evening.     torsemide 20 MG tablet  Commonly known as:  DEMADEX  Take 20 mg by mouth daily as needed (swelling).         SignedOtilio Connors, MD 08/30/2013, 9:48 AM

## 2013-08-30 NOTE — Preoperative (Signed)
Beta Blockers   Reason not to administer Beta Blockers:Not Applicable 

## 2013-08-30 NOTE — Progress Notes (Signed)
Pt doing very well. Home Health was set-up by Stanton Kidney, CM per MD order prior to D/C. Pt given D/C instructions with Rx's, verbal understanding given. Pt D/C'd home via wheelchair @ 1630 per MD order. Holli Humbles, RN

## 2013-08-30 NOTE — H&P (Signed)
See H& P.

## 2013-08-30 NOTE — Progress Notes (Signed)
08/30/13 Spoke with patient about HHC. He selected [facility] from [employer]. agencies list. Alfonzo Beers With [facility] and set up [place] and [place]. No other d/c needs identified. Fuller Plan RN, BSN, CCM

## 2013-08-30 NOTE — Anesthesia Procedure Notes (Signed)
Procedure Name: Intubation Date/Time: 08/30/2013 8:17 AM Performed by: Julian Reil Pre-anesthesia Checklist: Patient identified, Emergency Drugs available, Suction available and Patient being monitored Patient Re-evaluated:Patient Re-evaluated prior to inductionOxygen Delivery Method: Circle system utilized Preoxygenation: Pre-oxygenation with 100% oxygen Intubation Type: IV induction Ventilation: Mask ventilation without difficulty and Oral airway inserted - appropriate to patient size Laryngoscope Size: Mac and 4 Grade View: Grade II Tube type: Oral Tube size: 7.5 mm Number of attempts: 1 Airway Equipment and Method: Stylet Placement Confirmation: ETT inserted through vocal cords under direct vision,  positive ETCO2 and breath sounds checked- equal and bilateral Secured at: 22 cm Tube secured with: Tape Dental Injury: Teeth and Oropharynx as per pre-operative assessment

## 2013-08-30 NOTE — Op Note (Signed)
08/30/2013  9:43 AM  PATIENT:  [name]  67 y.o. male  PRE-OPERATIVE DIAGNOSIS:  Lumbar herniated nucleus pulposus  without myelopathy, Left L2-3  POST-OPERATIVE DIAGNOSIS: same  PROCEDURE:  Procedure(s): LUMBAR LAMINECTOMY/DECOMPRESSION MICRODISCECTOMY LEFT  LUMBAR TWO THREE,  microdisection  SURGEON:  Surgeon(s): Otilio Connors, MD Charlie Pitter, MD -assist  ANESTHESIA:   general  EBL:  Total I/O In: 1000 [I.V.:1000] Out: 20 [Blood:50]  BLOOD ADMINISTERED:none  DRAINS: none   SPECIMEN:  No Specimen  DICTATION: Patient with left hip and thigh and knee pain trouble walking. MRI lumbar spine was done showing disc herniation left-sided L2-3 root compression patient improved with time in for management and decided to proceed with surgical correction left L2-3 lamina discectomy.  And rendered operating room general anesthesia induced patient placed in prone position Wilson frame all pressure points padded. Patient from Dr. fashion segments inject with 20 cc morcellized with epinephrine. He was placed interspace x-rays attention needle was putting at the L2-3 disc incision was then made centered with needle was incision taken the fascia hemostasis obtained cauterization fascia incised on the left side and subperiosteal dissection over the L2 and 3 spinous process lamina to the facets are became retractors placed markers placed interspace another x-rays obtained and this confirmed or positioning at the L2-3 level.  Microscope was brought in for microdissection high-speed drill was used are semi-hemi-laminectomy medial facetectomy is completed with Kerrison punches , ligament flavum  was removed.  We explored the epidural space a large subligamentous disc herniation and the disc space was incised with 15 blade and discectomy done with pituitary rongeurs and curettes. We were finished we did decompression the central canal and the 2 and III nerve roots the.  It hemostasis with bipolar  cauterization Gelfoam thrombin Gelfoam was. Out we. About solution retractors were then removed we did hemostasis fascia closed with 0 Vicryl interrupted sutures excess tissue closed with 021 through Vicryl interrupted sutures skin closed benzoin Steri-Strips dressing was placed patient placed back in spine position woken (and transferred recovery.  PLAN OF CARE: Admit for overnight observation  PATIENT DISPOSITION:  PACU - hemodynamically stable.

## 2013-08-30 NOTE — Transfer of Care (Signed)
Immediate Anesthesia Transfer of Care Note  Patient: [name]  Procedure(s) Performed: Procedure(s): LUMBAR LAMINECTOMY/DECOMPRESSION MICRODISCECTOMY LEFT  LUMBAR TWO THREE (Left)  Patient Location: PACU  Anesthesia Type:General  Level of Consciousness: sedated and responds to stimulation  Airway & Oxygen Therapy: Patient Spontanous Breathing and Patient connected to nasal cannula oxygen  Post-op Assessment: Report given to PACU RN, Post -op Vital signs reviewed and stable and Patient moving all extremities  Post vital signs: Reviewed and stable  Complications: No apparent anesthesia complications

## 2013-08-30 NOTE — Anesthesia Preprocedure Evaluation (Signed)
Anesthesia Evaluation  Patient identified by MRN, date of birth, ID band Patient awake    Reviewed: Allergy & Precautions, H&P , NPO status , Patient's Chart, lab work & pertinent test results  Airway Mallampati: II TM Distance: <3 FB Neck ROM: Full    Dental   Pulmonary shortness of breath, COPDformer smoker,  + rhonchi   + decreased breath sounds      Cardiovascular hypertension, Rhythm:Regular Rate:Normal     Neuro/Psych Spinal stenosis  Neuromuscular disease    GI/Hepatic GERD-  Medicated,  Endo/Other  diabetes  Renal/GU      Musculoskeletal   Abdominal (+) + obese,   Peds  Hematology  (+) anemia ,   Anesthesia Other Findings   Reproductive/Obstetrics                           Anesthesia Physical Anesthesia Plan  ASA: III  Anesthesia Plan: General   Post-op Pain Management:    Induction: Intravenous  Airway Management Planned: Oral ETT  Additional Equipment:   Intra-op Plan:   Post-operative Plan: Extubation in OR  Informed Consent: I have reviewed the patients History and Physical, chart, labs and discussed the procedure including the risks, benefits and alternatives for the proposed anesthesia with the patient or authorized representative who has indicated his/her understanding and acceptance.     Plan Discussed with: CRNA and Surgeon  Anesthesia Plan Comments:         Anesthesia Quick Evaluation

## 2013-08-31 ENCOUNTER — Encounter (HOSPITAL_COMMUNITY): Payer: Self-pay | Admitting: Neurosurgery

## 2013-09-01 ENCOUNTER — Telehealth: Payer: Self-pay | Admitting: Family Medicine

## 2013-09-01 MED ORDER — PRAVASTATIN SODIUM 40 MG PO TABS: 40.0000 mg | ORAL_TABLET | Freq: Every day | ORAL | Status: DC

## 2013-09-01 NOTE — Telephone Encounter (Signed)
scipt sent in and Angie aware of change adn will let pt know

## 2013-09-01 NOTE — Telephone Encounter (Signed)
Called Angie back and aware that waiting on response

## 2013-09-01 NOTE — Telephone Encounter (Signed)
D/C Zocor Change to pravastatin 33m 1 tablet at bedtime, send new scrip  #30 R 3

## 2013-09-01 NOTE — Telephone Encounter (Signed)
The two drugs that are interacting are the simvastin and amlodipine, What do you want to do?

## 2013-09-01 NOTE — Telephone Encounter (Signed)
Level 1 drug interaction she is required to report it and hear back in the same day Michael [name] from [place]

## 2013-09-01 NOTE — Telephone Encounter (Signed)
Michael [name] has called back [phone] is call back number

## 2013-09-14 ENCOUNTER — Telehealth: Payer: Self-pay | Admitting: Family Medicine

## 2013-09-14 NOTE — Telephone Encounter (Signed)
Call back number [phone] Pt is needing a refill on his Hydrocodone

## 2013-09-15 ENCOUNTER — Other Ambulatory Visit: Payer: Self-pay | Admitting: *Deleted

## 2013-09-15 MED ORDER — HYDROCODONE-ACETAMINOPHEN 5-325 MG PO TABS: 1.0000 | ORAL_TABLET | ORAL | Status: DC | PRN

## 2013-09-15 NOTE — Telephone Encounter (Signed)
Med printed ready for dr approval and pt to pick up

## 2013-09-15 NOTE — Telephone Encounter (Signed)
Okay to refill? 

## 2013-09-15 NOTE — Telephone Encounter (Signed)
?   Ok to refill, last refill 08/03/13

## 2013-09-15 NOTE — Telephone Encounter (Signed)
Sent prescription with quanity of 120

## 2013-10-15 ENCOUNTER — Ambulatory Visit: Payer: Medicare Other | Admitting: Family Medicine

## 2013-11-01 ENCOUNTER — Telehealth: Payer: Self-pay | Admitting: *Deleted

## 2013-11-01 MED ORDER — HYDROCHLOROTHIAZIDE 25 MG PO TABS: 25.0000 mg | ORAL_TABLET | Freq: Every day | ORAL | Status: DC

## 2013-11-01 MED ORDER — NAPROXEN 375 MG PO TABS: 375.0000 mg | ORAL_TABLET | Freq: Two times a day (BID) | ORAL | Status: DC

## 2013-11-01 NOTE — Telephone Encounter (Signed)
meds refilled, called pt to let him know needs a 3 mos follow up on vm

## 2013-11-02 ENCOUNTER — Encounter: Payer: Self-pay | Admitting: Family Medicine

## 2013-11-02 ENCOUNTER — Other Ambulatory Visit: Payer: Self-pay | Admitting: Family Medicine

## 2013-11-02 NOTE — Telephone Encounter (Signed)
Medication refill for one time only.  Patient needs to be seen.  Letter sent for patient to call and schedule 

## 2013-11-16 ENCOUNTER — Ambulatory Visit: Payer: Medicare Other | Admitting: Family Medicine

## 2013-11-19 ENCOUNTER — Encounter: Payer: Self-pay | Admitting: Family Medicine

## 2013-11-19 ENCOUNTER — Ambulatory Visit (INDEPENDENT_AMBULATORY_CARE_PROVIDER_SITE_OTHER): Payer: Commercial Managed Care - HMO | Admitting: Family Medicine

## 2013-11-19 VITALS — BP 138/80 | HR 78 | Temp 98.5°F | Resp 18 | Ht 60.0 in | Wt 204.0 lb

## 2013-11-19 DIAGNOSIS — K7689 Other specified diseases of liver: Secondary | ICD-10-CM

## 2013-11-19 DIAGNOSIS — I10 Essential (primary) hypertension: Secondary | ICD-10-CM

## 2013-11-19 DIAGNOSIS — IMO0002 Reserved for concepts with insufficient information to code with codable children: Secondary | ICD-10-CM

## 2013-11-19 DIAGNOSIS — E119 Type 2 diabetes mellitus without complications: Secondary | ICD-10-CM

## 2013-11-19 DIAGNOSIS — D649 Anemia, unspecified: Secondary | ICD-10-CM

## 2013-11-19 DIAGNOSIS — M1712 Unilateral primary osteoarthritis, left knee: Secondary | ICD-10-CM

## 2013-11-19 DIAGNOSIS — R609 Edema, unspecified: Secondary | ICD-10-CM

## 2013-11-19 DIAGNOSIS — K7581 Nonalcoholic steatohepatitis (NASH): Secondary | ICD-10-CM

## 2013-11-19 DIAGNOSIS — M1710 Unilateral primary osteoarthritis, unspecified knee: Secondary | ICD-10-CM

## 2013-11-19 MED ORDER — HYDROCODONE-ACETAMINOPHEN 5-325 MG PO TABS: 1.0000 | ORAL_TABLET | ORAL | Status: DC | PRN

## 2013-11-19 MED ORDER — GLIPIZIDE 10 MG PO TABS: 10.0000 mg | ORAL_TABLET | Freq: Two times a day (BID) | ORAL | Status: DC

## 2013-11-19 NOTE — Patient Instructions (Signed)
Continue current medications Next 3 days- take water pill with potassium  Get the xray of the knee Prevnar shot given Schedule foot doctor appt F/U 3 months

## 2013-11-19 NOTE — Assessment & Plan Note (Signed)
Fasting labs, continue novolog 25units twice a day

## 2013-11-21 NOTE — Assessment & Plan Note (Signed)
Recheck CBC , he wants to hold on GI at this time, we will send pending labs

## 2013-11-21 NOTE — Progress Notes (Signed)
Patient ID: Michael [name], male   DOB: 06-01-1946, 68 y.o.   MRN: [id number]   Subjective:    Patient ID: Michael [name], male    DOB: 05-05-1946, 68 y.o.   MRN: [id number]  Patient presents for 3 mos follow up DM- blood sugars fasting has been 113-137, no hypoglygemia, he has been giving Insulin 23 units twice a day, taking oral medications as prescribed, last A1C 7.3%. Due for fasting labs  Osteoarthritis- needs xrays of left knee, ortho planning for total knee replacement and due to insurance this needs to be ordered by PCP, pain meds also need to be refilled  Anemia- has not seen GI, no active bleeding due for repeat CBC  PREVNAR 13 due    Review Of Systems:  GEN- denies fatigue, fever, weight loss,weakness, recent illness HEENT- denies eye drainage, change in vision, nasal discharge, CVS- denies chest pain, palpitations RESP- denies SOB, cough, wheeze ABD- denies N/V, change in stools, abd pain GU- denies dysuria, hematuria, dribbling, incontinence MSK- denies joint pain, muscle aches, injury Neuro- denies headache, dizziness, syncope, seizure activity       Objective:    BP 138/80  Pulse 78  Temp(Src) 98.5 F (36.9 C) (Oral)  Resp 18  Ht 5' (1.524 m)  Wt 204 lb (92.534 kg)  BMI 39.84 kg/m2 GEN- NAD, alert and oriented x3 HEENT- PERRL, EOMI, non injected sclera, pink conjunctiva, MMM, oropharynx clear CVS- RRR, no murmur RESP-CTAB ABD-NABS,soft,NT,ND EXT- 1+ edema LE 2-3 cm above ankles  Pulses- Radial, DP- 2+        Assessment & Plan:      Problem List Items Addressed This Visit   Osteoarthritis of left knee - Primary   Relevant Medications      HYDROcodone-acetaminophen (NORCO/VICODIN) 5-325 MG per tablet   Other Relevant Orders      DG Knee Complete 4 Views Left   DIABETES MELLITUS, WITHOUT COMPLICATIONS     Fasting labs, continue novolog 25units twice a day      Relevant Medications      glipiZIDE (GLUCOTROL) tablet   Other Relevant Orders     CBC with Differential      Comprehensive metabolic panel      Hemoglobin A1c      Microalbumin / creatinine urine ratio      Lipid panel      Note: This dictation was prepared with Dragon dictation along with smaller phrase technology. Any transcriptional errors that result from this process are unintentional.

## 2013-11-21 NOTE — Assessment & Plan Note (Signed)
Restart diureitc with potassium, elevate legs

## 2013-11-21 NOTE — Assessment & Plan Note (Signed)
Check FLP and LFT

## 2013-11-21 NOTE — Assessment & Plan Note (Signed)
Well controlled, no change to meds 

## 2013-11-26 ENCOUNTER — Ambulatory Visit (HOSPITAL_COMMUNITY)
Admission: RE | Admit: 2013-11-26 | Discharge: 2013-11-26 | Disposition: A | Discharge status: Home / Self Care | Payer: Medicare HMO | Source: Ambulatory Visit | Attending: Family Medicine | Admitting: Family Medicine

## 2013-11-26 DIAGNOSIS — E1149 Type 2 diabetes mellitus with other diabetic neurological complication: Secondary | ICD-10-CM | POA: Insufficient documentation

## 2013-11-26 DIAGNOSIS — M1710 Unilateral primary osteoarthritis, unspecified knee: Secondary | ICD-10-CM | POA: Insufficient documentation

## 2013-11-26 DIAGNOSIS — E1142 Type 2 diabetes mellitus with diabetic polyneuropathy: Secondary | ICD-10-CM | POA: Insufficient documentation

## 2013-11-26 DIAGNOSIS — M1712 Unilateral primary osteoarthritis, left knee: Secondary | ICD-10-CM

## 2013-11-26 DIAGNOSIS — I10 Essential (primary) hypertension: Secondary | ICD-10-CM | POA: Insufficient documentation

## 2013-11-26 LAB — CBC WITH DIFFERENTIAL/PLATELET
BASOS ABS: 0.2 10*3/uL — AB (ref 0.0–0.1)
Basophils Relative: 2 % — ABNORMAL HIGH (ref 0–1)
EOS ABS: 0.1 10*3/uL (ref 0.0–0.7)
EOS PCT: 1 % (ref 0–5)
HCT: 32.1 % — ABNORMAL LOW (ref 39.0–52.0)
Hemoglobin: 9.6 g/dL — ABNORMAL LOW (ref 13.0–17.0)
Lymphocytes Relative: 24 % (ref 12–46)
Lymphs Abs: 1.8 10*3/uL (ref 0.7–4.0)
MCH: 19.5 pg — AB (ref 26.0–34.0)
MCHC: 29.9 g/dL — AB (ref 30.0–36.0)
MCV: 65.2 fL — AB (ref 78.0–100.0)
Monocytes Absolute: 0.5 10*3/uL (ref 0.1–1.0)
Monocytes Relative: 7 % (ref 3–12)
Neutro Abs: 5 10*3/uL (ref 1.7–7.7)
Neutrophils Relative %: 66 % (ref 43–77)
PLATELETS: 295 10*3/uL (ref 150–400)
RBC: 4.92 MIL/uL (ref 4.22–5.81)
RDW: 17 % — AB (ref 11.5–15.5)
WBC: 7.6 10*3/uL (ref 4.0–10.5)

## 2013-11-26 LAB — COMPREHENSIVE METABOLIC PANEL
ALT: 31 U/L (ref 0–53)
AST: 29 U/L (ref 0–37)
Albumin: 4.6 g/dL (ref 3.5–5.2)
Alkaline Phosphatase: 57 U/L (ref 39–117)
BILIRUBIN TOTAL: 0.3 mg/dL (ref 0.2–1.2)
BUN: 9 mg/dL (ref 6–23)
CO2: 27 meq/L (ref 19–32)
Calcium: 9.6 mg/dL (ref 8.4–10.5)
Chloride: 95 mEq/L — ABNORMAL LOW (ref 96–112)
Creat: 0.76 mg/dL (ref 0.50–1.35)
Glucose, Bld: 62 mg/dL — ABNORMAL LOW (ref 70–99)
Potassium: 4.4 mEq/L (ref 3.5–5.3)
Sodium: 133 mEq/L — ABNORMAL LOW (ref 135–145)
Total Protein: 6.8 g/dL (ref 6.0–8.3)

## 2013-11-26 LAB — HEMOGLOBIN A1C
HEMOGLOBIN A1C: 6.5 % — AB (ref <5.7)
Mean Plasma Glucose: 140 mg/dL — ABNORMAL HIGH (ref <117)

## 2013-11-26 LAB — LIPID PANEL
Cholesterol: 147 mg/dL (ref 0–200)
HDL: 37 mg/dL — AB (ref >39)
LDL Cholesterol: 77 mg/dL (ref 0–99)
Total CHOL/HDL Ratio: 4 Ratio
Triglycerides: 165 mg/dL — ABNORMAL HIGH (ref <150)
VLDL: 33 mg/dL (ref 0–40)

## 2013-11-27 LAB — MICROALBUMIN / CREATININE URINE RATIO
Creatinine, Urine: 46.5 mg/dL
Microalb Creat Ratio: 39.8 mg/g — ABNORMAL HIGH (ref 0.0–30.0)
Microalb, Ur: 1.85 mg/dL (ref 0.00–1.89)

## 2013-11-28 ENCOUNTER — Other Ambulatory Visit: Payer: Self-pay | Admitting: Family Medicine

## 2013-11-28 MED ORDER — BENAZEPRIL HCL 40 MG PO TABS: 40.0000 mg | ORAL_TABLET | Freq: Every day | ORAL | Status: DC

## 2013-11-29 NOTE — Progress Notes (Signed)
LMTRC

## 2013-11-29 NOTE — Progress Notes (Signed)
Call placed to patient and patient made aware.   States that he is taking benazepril QD, but he is only taking torsemide as needed for swelling.

## 2013-11-29 NOTE — Progress Notes (Signed)
Call placed to patient and patient made aware.   Patient states he has appointment with orthopedic on 11/30/2013.

## 2013-11-30 ENCOUNTER — Ambulatory Visit (INDEPENDENT_AMBULATORY_CARE_PROVIDER_SITE_OTHER): Payer: Medicare HMO | Admitting: Orthopedic Surgery

## 2013-11-30 ENCOUNTER — Other Ambulatory Visit: Payer: Self-pay | Admitting: *Deleted

## 2013-11-30 VITALS — BP 154/78 | Ht 63.0 in | Wt 204.0 lb

## 2013-11-30 DIAGNOSIS — Z9889 Other specified postprocedural states: Secondary | ICD-10-CM

## 2013-11-30 DIAGNOSIS — IMO0002 Reserved for concepts with insufficient information to code with codable children: Secondary | ICD-10-CM

## 2013-11-30 DIAGNOSIS — M7650 Patellar tendinitis, unspecified knee: Secondary | ICD-10-CM

## 2013-11-30 DIAGNOSIS — M1710 Unilateral primary osteoarthritis, unspecified knee: Secondary | ICD-10-CM

## 2013-11-30 DIAGNOSIS — M1712 Unilateral primary osteoarthritis, left knee: Secondary | ICD-10-CM

## 2013-11-30 MED ORDER — NAPROXEN 375 MG PO TABS: 375.0000 mg | ORAL_TABLET | Freq: Two times a day (BID) | ORAL | Status: DC

## 2013-11-30 MED ORDER — OMEPRAZOLE 20 MG PO CPDR: 20.0000 mg | DELAYED_RELEASE_CAPSULE | Freq: Two times a day (BID) | ORAL | Status: DC

## 2013-11-30 NOTE — Telephone Encounter (Signed)
Refill appropriate and filled per protocol. 

## 2013-11-30 NOTE — Patient Instructions (Signed)
You have received a steroid shot. 15% of patients experience increased pain at the injection site with in the next 24 hours. This is best treated with ice and tylenol extra strength 2 tabs every 8 hours. If you are still having pain please call the office.    

## 2013-12-01 ENCOUNTER — Encounter: Payer: Self-pay | Admitting: Orthopedic Surgery

## 2013-12-01 NOTE — Progress Notes (Signed)
Patient ID: Michael [name], male   DOB: [birth year], 68 y.o.   MRN: [id number]  Chief Complaint  Patient presents with  . Knee Pain    Recurrent Left knee pain   HISTORY: 68 year old male 1 year status post arthroscopy left knee for medial meniscal tear and osteoarthritis with a chronic history patellar tendinitis, also status post recent lumbar laminectomy presents with recurrent diffuse left knee pain. No history of trauma. The patient reports crepitance pain swelling decreased range of motion. Denies catching locking giving way  Other review of systems negative  The past, family history and social history have been reviewed and are recorded in the corresponding sections of epic   Vital signs:   General the patient is well-developed and well-nourished grooming and hygiene are normal Oriented x3 Mood and affect normal Ambulation normal  Inspection of the left knee: Diffuse swelling and diffuse tenderness on the medial and lateral sides of the joint as well as the patellar tendon and quadriceps tendon. Range of motion is 5-125 ligaments remain stable motor exam is normal skin is clean dry and intact portal sites are nontender Cardiovascular exam is normal Sensory exam normal  Imaging Mild joint space narrowing  Impression Encounter Diagnoses  Name Primary?  . Status post arthroscopy of left knee Yes  . Osteoarthritis of left knee   . Patellar tendonitis     Assessment: The patient most likely has acute synovitis of the joint. His x-ray was taken recently and does not show any progression of his arthritis from a tibiofemoral joint space perspective  Recommend injection of the joint and follow as needed he has adequate pain medication.  Procedure Injection left knee Medications: Ethyl chloride for topical anesthetic. Lidocaine 1% 3 cc. 40 mg of Depo-Medrol per mL, 1 mL. Verbal consent. Timeout to confirm site of injection as left knee Alcohol was used to clean the skin  followed by ethyl chloride to anesthetize the skin. A lateral approach was used to inject the knee with lidocaine and Depo-Medrol.  No complications were noted. A sterile bandage was applied.

## 2013-12-28 ENCOUNTER — Telehealth: Payer: Self-pay | Admitting: *Deleted

## 2013-12-28 MED ORDER — HYDROCODONE-ACETAMINOPHEN 5-325 MG PO TABS: 1.0000 | ORAL_TABLET | ORAL | Status: DC | PRN

## 2013-12-28 NOTE — Telephone Encounter (Signed)
Message copied by Sheral Flow on Tue Dec 28, 2013  3:29 PM ------      Message from: [name]      Created: Tue Dec 28, 2013  2:39 PM      Contact: [phone]       Okay to refill, put into chart, you can convert this to a phone note      ----- Message -----         From: Eden Lathe Myleka Moncure, LPN         Sent: 8/32/9191   2:07 PM           To: [name], MD            Ok to refill??            Last office visit 11/19/2013.             Last refill 11/19/2013.            ----- Message -----         From: Charlynne Cousins [place]         Sent: 12/28/2013   9:51 AM           To: Eden Lathe Exa Bomba, LPN            Needs Rx for Hydrocodone             ------

## 2013-12-28 NOTE — Telephone Encounter (Signed)
Prescription printed and patient made aware to come to office to pick up.

## 2013-12-29 ENCOUNTER — Other Ambulatory Visit: Payer: Self-pay | Admitting: Family Medicine

## 2013-12-29 NOTE — Telephone Encounter (Signed)
Message copied by Sheral Flow on Wed Dec 29, 2013 10:32 AM ------      Message from: [facility], Charlynne Cousins      Created: Wed Dec 29, 2013 10:23 AM      Contact: [phone]       He needs refills on the following meds:      Novalog mix 70/30      Metformin 1000 mg tabs      Amlodipine Besylate 10 mgs tabs      HCTZ 25 mg      Pravastatin 40 mgs       ------

## 2013-12-29 NOTE — Telephone Encounter (Signed)
Refill appropriate and filled per protocol. 

## 2014-01-03 ENCOUNTER — Other Ambulatory Visit: Payer: Self-pay | Admitting: *Deleted

## 2014-01-03 MED ORDER — METFORMIN HCL 1000 MG PO TABS: 1000.0000 mg | ORAL_TABLET | Freq: Two times a day (BID) | ORAL | Status: DC

## 2014-01-03 NOTE — Telephone Encounter (Signed)
Refill appropriate and filled per protocol. 

## 2014-01-04 ENCOUNTER — Ambulatory Visit: Payer: Medicare Other | Admitting: Orthopedic Surgery

## 2014-02-08 ENCOUNTER — Ambulatory Visit (INDEPENDENT_AMBULATORY_CARE_PROVIDER_SITE_OTHER): Payer: Commercial Managed Care - HMO | Admitting: Family Medicine

## 2014-02-08 ENCOUNTER — Encounter: Payer: Self-pay | Admitting: Family Medicine

## 2014-02-08 VITALS — BP 136/78 | HR 86 | Temp 97.9°F | Resp 18 | Ht 60.0 in | Wt 202.0 lb

## 2014-02-08 DIAGNOSIS — M1712 Unilateral primary osteoarthritis, left knee: Secondary | ICD-10-CM

## 2014-02-08 DIAGNOSIS — S83209A Unspecified tear of unspecified meniscus, current injury, unspecified knee, initial encounter: Secondary | ICD-10-CM

## 2014-02-08 DIAGNOSIS — M1710 Unilateral primary osteoarthritis, unspecified knee: Secondary | ICD-10-CM

## 2014-02-08 DIAGNOSIS — IMO0002 Reserved for concepts with insufficient information to code with codable children: Secondary | ICD-10-CM

## 2014-02-08 MED ORDER — HYDROCODONE-ACETAMINOPHEN 10-325 MG PO TABS: 1.0000 | ORAL_TABLET | Freq: Three times a day (TID) | ORAL | Status: DC | PRN

## 2014-02-08 NOTE — Assessment & Plan Note (Signed)
Status post arthroscopic  will forward previous notes

## 2014-02-08 NOTE — Assessment & Plan Note (Signed)
Increase his hydrocodone to 10/325 mg to decrease the Tylenol that he is taking. He'll be sent to orthopedics for second opinion appear

## 2014-02-08 NOTE — Progress Notes (Signed)
Patient ID: Michael [name], male   DOB: 20-Feb-1946, 68 y.o.   MRN: [id number]   Subjective:    Patient ID: Michael [name], male    DOB: [birth year], 69 y.o.   MRN: [id number]  Patient presents for Pain in feet and legs  Patient here secondary to worsening bilateral knee pain. He's been seen by orthopedics. He request a referral to have a second opinion regarding his knee pain. He does not have very much arthritis but has had a meniscal tear as well as arthroscopically on the left knee. He feels like he is favoring the right knee there for that is been causing more pain. She continues to get locking of the left knee and sometimes it gives out. He also gets fluid from time to time. He is using Voltaren Gel on his knee and he was using hydrocodone 5/325 some days he did take 1-2 tablets at a time to help ease the pain. He is now out of this medication.   Review Of Systems:  GEN- denies fatigue, fever, weight loss,weakness, recent illness MSK- + joint pain, muscle aches, injury Neuro- denies headache, dizziness, syncope, seizure activity       Objective:    BP 136/78  Pulse 86  Temp(Src) 97.9 F (36.6 C) (Oral)  Resp 18  Ht 5' (1.524 m)  Wt 202 lb (91.627 kg)  BMI 39.45 kg/m2 GEN- NAD, alert and oriented x3 MSK- Left knee- mild effusion, fair ROM, +crepitus, TTP inferior knee, Right knee- normal inspection, fair ROM, walks with cane EXT- trace edema Pulses- Radial 2+        Assessment & Plan:      Problem List Items Addressed This Visit   None      Note: This dictation was prepared with Dragon dictation along with smaller phrase technology. Any transcriptional errors that result from this process are unintentional.

## 2014-02-08 NOTE — Patient Instructions (Signed)
Referral to Dr. Noemi Chapel Change pain medication to hydrocodone 10-339m  F/U 2 months for diabetes

## 2014-02-14 ENCOUNTER — Telehealth: Payer: Self-pay | Admitting: *Deleted

## 2014-02-14 NOTE — Telephone Encounter (Signed)
Faxed [facility] mgmt [employer] on 02/09/14 today received authorization from [facility] with authorization number [id number] to Dr. Fredonia Highland at [facility]

## 2014-03-09 ENCOUNTER — Telehealth: Payer: Self-pay | Admitting: *Deleted

## 2014-03-09 MED ORDER — HYDROCODONE-ACETAMINOPHEN 10-325 MG PO TABS: 1.0000 | ORAL_TABLET | Freq: Three times a day (TID) | ORAL | Status: DC | PRN

## 2014-03-09 NOTE — Telephone Encounter (Signed)
Okay to refill? 

## 2014-03-09 NOTE — Telephone Encounter (Signed)
Pt called needing refill on HYDROcodone-acetaminophen (NORCO) 10-325 MG per tablet

## 2014-03-09 NOTE — Telephone Encounter (Signed)
Med printed ready for provider signature and pt to pick up

## 2014-03-15 ENCOUNTER — Other Ambulatory Visit (HOSPITAL_COMMUNITY): Payer: Self-pay | Admitting: Orthopedic Surgery

## 2014-03-15 DIAGNOSIS — M8430XA Stress fracture, unspecified site, initial encounter for fracture: Secondary | ICD-10-CM

## 2014-03-18 ENCOUNTER — Ambulatory Visit (HOSPITAL_COMMUNITY): Payer: Medicare HMO

## 2014-03-23 ENCOUNTER — Ambulatory Visit (HOSPITAL_COMMUNITY)
Admission: RE | Admit: 2014-03-23 | Discharge: 2014-03-23 | Disposition: A | Discharge status: Home / Self Care | Payer: Medicare HMO | Source: Ambulatory Visit | Attending: Orthopedic Surgery | Admitting: Orthopedic Surgery

## 2014-03-23 DIAGNOSIS — M2390 Unspecified internal derangement of unspecified knee: Secondary | ICD-10-CM | POA: Insufficient documentation

## 2014-03-23 DIAGNOSIS — M8430XA Stress fracture, unspecified site, initial encounter for fracture: Secondary | ICD-10-CM

## 2014-03-23 DIAGNOSIS — M25569 Pain in unspecified knee: Secondary | ICD-10-CM | POA: Insufficient documentation

## 2014-03-23 DIAGNOSIS — M159 Polyosteoarthritis, unspecified: Secondary | ICD-10-CM | POA: Insufficient documentation

## 2014-03-30 ENCOUNTER — Other Ambulatory Visit: Payer: Self-pay | Admitting: Family Medicine

## 2014-03-31 NOTE — Telephone Encounter (Signed)
Prescription sent to pharmacy.

## 2014-04-12 ENCOUNTER — Ambulatory Visit (INDEPENDENT_AMBULATORY_CARE_PROVIDER_SITE_OTHER): Payer: Commercial Managed Care - HMO | Admitting: Family Medicine

## 2014-04-12 ENCOUNTER — Encounter: Payer: Self-pay | Admitting: Family Medicine

## 2014-04-12 VITALS — BP 132/76 | HR 72 | Temp 98.0°F | Resp 16 | Ht 60.0 in | Wt 205.0 lb

## 2014-04-12 DIAGNOSIS — M1710 Unilateral primary osteoarthritis, unspecified knee: Secondary | ICD-10-CM

## 2014-04-12 DIAGNOSIS — I10 Essential (primary) hypertension: Secondary | ICD-10-CM

## 2014-04-12 DIAGNOSIS — G609 Hereditary and idiopathic neuropathy, unspecified: Secondary | ICD-10-CM

## 2014-04-12 DIAGNOSIS — G629 Polyneuropathy, unspecified: Secondary | ICD-10-CM

## 2014-04-12 DIAGNOSIS — L259 Unspecified contact dermatitis, unspecified cause: Secondary | ICD-10-CM

## 2014-04-12 DIAGNOSIS — Z23 Encounter for immunization: Secondary | ICD-10-CM

## 2014-04-12 DIAGNOSIS — E785 Hyperlipidemia, unspecified: Secondary | ICD-10-CM

## 2014-04-12 DIAGNOSIS — L309 Dermatitis, unspecified: Secondary | ICD-10-CM

## 2014-04-12 DIAGNOSIS — E119 Type 2 diabetes mellitus without complications: Secondary | ICD-10-CM

## 2014-04-12 DIAGNOSIS — M1712 Unilateral primary osteoarthritis, left knee: Secondary | ICD-10-CM

## 2014-04-12 MED ORDER — HYDROCODONE-ACETAMINOPHEN 10-325 MG PO TABS: 1.0000 | ORAL_TABLET | Freq: Three times a day (TID) | ORAL | Status: DC | PRN

## 2014-04-12 MED ORDER — CLOBETASOL PROPIONATE 0.05 % EX CREA: 1.0000 "application " | TOPICAL_CREAM | Freq: Two times a day (BID) | CUTANEOUS | Status: DC

## 2014-04-12 NOTE — Assessment & Plan Note (Signed)
He'll have repeat fasting lipids his goal is an A1c less than 7%. He also received Prevnar 13 today

## 2014-04-12 NOTE — Assessment & Plan Note (Signed)
Blood pressure is well-controlled no change in medication 

## 2014-04-12 NOTE — Progress Notes (Signed)
Patient ID: GEOVANNIE [name], male   DOB: [birth year], 68 y.o.   MRN: [id number]   Subjective:    Patient ID: Michael [name], male    DOB: 1946-07-30, 68 y.o.   MRN: [id number]  Patient presents for 2 month F/u and Sores in head  Patient here to followup chronic medical problems. Diabetes mellitus he hasn't been checking his blood sugars on arrival basis his last A1c was 6.5% he is given insulin 70 3023 units twice a day. No hypoglycemia.  Osteoarthritis of the knee status meniscal tear he did have a second opinion with orthopedics he had another steroid injection which has improved his pain. He continues to rely on pain medication he will also undergo another round of physical therapy. Handicap sticker was given to him today   Scalp itching he states that he has a history of psoriasis though mild with a past couple months he's noticed a small itchy bumps on his scalp when he is when out of the sun without baseball cap he actually improved. He is to have a cream that he be used on his elbows as well but he has not required this.   Review Of Systems:  GEN- denies fatigue, fever, weight loss,weakness, recent illness HEENT- denies eye drainage, change in vision, nasal discharge, CVS- denies chest pain, palpitations RESP- denies SOB, cough, wheeze ABD- denies N/V, change in stools, abd pain GU- denies dysuria, hematuria, dribbling, incontinence MSK- + joint pain, muscle aches, injury Neuro- denies headache, dizziness, syncope, seizure activity       Objective:    BP 132/76  Pulse 72  Temp(Src) 98 F (36.7 C) (Oral)  Resp 16  Ht 5' (1.524 m)  Wt 205 lb (92.987 kg)  BMI 40.04 kg/m2 GEN- NAD, alert and oriented x3 HEENT- PERRL, EOMI, non injected sclera, pink conjunctiva, MMM, oropharynx clear Neck- Supple, no bruit CVS- RRR, no murmur RESP-CTAB SKIN- scalp- 2-3 scaley white lesions on right parietal region,mild erythema, no other lesions noted, no lesions on elbows EXT-1+  edema Pulses- Radial, DP- 2+        Assessment & Plan:      Problem List Items Addressed This Visit   HYPERTENSION - Primary   HYPERLIPIDEMIA   Relevant Orders      Lipid panel   DIABETES MELLITUS, WITHOUT COMPLICATIONS   Relevant Orders      CBC with Differential      Comprehensive metabolic panel      Hemoglobin A1c   Dermatitis    Other Visit Diagnoses   Need for prophylactic vaccination against Streptococcus pneumoniae (pneumococcus)        Relevant Orders       Pneumococcal conjugate vaccine 13-valent (Completed)       Note: This dictation was prepared with Dragon dictation along with smaller phrase technology. Any transcriptional errors that result from this process are unintentional.

## 2014-04-12 NOTE — Assessment & Plan Note (Signed)
Secondary to diabetes symptoms are well-controlled

## 2014-04-12 NOTE — Assessment & Plan Note (Signed)
I will give him clobetasol to apply to the lesions in his scalp after washing possible this is psoriasis related he does not have any significant plaques therefore difficult to tell.

## 2014-04-12 NOTE — Patient Instructions (Signed)
Get the labs done fasting, we will call with results Use cream to scalp twice a day  Pain medication for July and August given Prevnar 13 shot given F/U 3 months

## 2014-04-12 NOTE — Assessment & Plan Note (Signed)
Chronic pain medications refilled

## 2014-04-18 ENCOUNTER — Other Ambulatory Visit: Payer: Self-pay | Admitting: *Deleted

## 2014-04-18 ENCOUNTER — Telehealth: Payer: Self-pay | Admitting: *Deleted

## 2014-04-18 DIAGNOSIS — Z1211 Encounter for screening for malignant neoplasm of colon: Secondary | ICD-10-CM

## 2014-04-18 DIAGNOSIS — D509 Iron deficiency anemia, unspecified: Secondary | ICD-10-CM

## 2014-04-18 LAB — CBC WITH DIFFERENTIAL/PLATELET
BASOS PCT: 3 % — AB (ref 0–1)
Basophils Absolute: 0.1 10*3/uL (ref 0.0–0.1)
EOS ABS: 0.1 10*3/uL (ref 0.0–0.7)
Eosinophils Relative: 2 % (ref 0–5)
HEMATOCRIT: 27.3 % — AB (ref 39.0–52.0)
HEMOGLOBIN: 8 g/dL — AB (ref 13.0–17.0)
LYMPHS ABS: 1.4 10*3/uL (ref 0.7–4.0)
Lymphocytes Relative: 31 % (ref 12–46)
MCH: 18.3 pg — AB (ref 26.0–34.0)
MCHC: 29.3 g/dL — ABNORMAL LOW (ref 30.0–36.0)
MCV: 62.5 fL — ABNORMAL LOW (ref 78.0–100.0)
MONO ABS: 0.4 10*3/uL (ref 0.1–1.0)
MONOS PCT: 8 % (ref 3–12)
NEUTROS PCT: 56 % (ref 43–77)
Neutro Abs: 2.6 10*3/uL (ref 1.7–7.7)
Platelets: 230 10*3/uL (ref 150–400)
RBC: 4.37 MIL/uL (ref 4.22–5.81)
RDW: 18.4 % — ABNORMAL HIGH (ref 11.5–15.5)
WBC: 4.6 10*3/uL (ref 4.0–10.5)

## 2014-04-18 LAB — COMPREHENSIVE METABOLIC PANEL
ALBUMIN: 4.2 g/dL (ref 3.5–5.2)
ALK PHOS: 57 U/L (ref 39–117)
ALT: 44 U/L (ref 0–53)
AST: 30 U/L (ref 0–37)
BUN: 13 mg/dL (ref 6–23)
CALCIUM: 8.9 mg/dL (ref 8.4–10.5)
CHLORIDE: 99 meq/L (ref 96–112)
CO2: 30 mEq/L (ref 19–32)
Creat: 0.75 mg/dL (ref 0.50–1.35)
Glucose, Bld: 94 mg/dL (ref 70–99)
POTASSIUM: 4.7 meq/L (ref 3.5–5.3)
SODIUM: 137 meq/L (ref 135–145)
TOTAL PROTEIN: 6.1 g/dL (ref 6.0–8.3)
Total Bilirubin: 0.4 mg/dL (ref 0.2–1.2)

## 2014-04-18 LAB — LIPID PANEL
CHOL/HDL RATIO: 3.2 ratio
CHOLESTEROL: 141 mg/dL (ref 0–200)
HDL: 44 mg/dL (ref >39)
LDL Cholesterol: 82 mg/dL (ref 0–99)
Triglycerides: 77 mg/dL (ref <150)
VLDL: 15 mg/dL (ref 0–40)

## 2014-04-18 LAB — HEMOGLOBIN A1C
Hgb A1c MFr Bld: 7.1 % — ABNORMAL HIGH (ref <5.7)
Mean Plasma Glucose: 157 mg/dL — ABNORMAL HIGH (ref <117)

## 2014-04-18 NOTE — Telephone Encounter (Signed)
Noted, pt to see GI

## 2014-04-18 NOTE — Telephone Encounter (Signed)
Joaquim Lai from [place] [facility] calling for a critical lab stating that pts hemoglobin is 8.0

## 2014-04-19 ENCOUNTER — Encounter: Payer: Self-pay | Admitting: Gastroenterology

## 2014-04-19 ENCOUNTER — Ambulatory Visit (HOSPITAL_COMMUNITY): Payer: Medicare HMO | Attending: Orthopedic Surgery | Admitting: Physical Therapy

## 2014-04-30 ENCOUNTER — Other Ambulatory Visit: Payer: Self-pay | Admitting: Family Medicine

## 2014-05-02 NOTE — Telephone Encounter (Signed)
Refill appropriate and filled per protocol. 

## 2014-05-03 ENCOUNTER — Ambulatory Visit (HOSPITAL_COMMUNITY)
Admission: RE | Admit: 2014-05-03 | Discharge: 2014-05-03 | Disposition: A | Discharge status: Home / Self Care | Payer: Commercial Managed Care - HMO | Source: Ambulatory Visit | Attending: Orthopedic Surgery | Admitting: Orthopedic Surgery

## 2014-05-03 DIAGNOSIS — M25669 Stiffness of unspecified knee, not elsewhere classified: Secondary | ICD-10-CM | POA: Insufficient documentation

## 2014-05-03 DIAGNOSIS — IMO0001 Reserved for inherently not codable concepts without codable children: Secondary | ICD-10-CM | POA: Insufficient documentation

## 2014-05-03 DIAGNOSIS — M25569 Pain in unspecified knee: Secondary | ICD-10-CM | POA: Insufficient documentation

## 2014-05-03 DIAGNOSIS — M25659 Stiffness of unspecified hip, not elsewhere classified: Secondary | ICD-10-CM

## 2014-05-03 DIAGNOSIS — Z9889 Other specified postprocedural states: Secondary | ICD-10-CM

## 2014-05-03 DIAGNOSIS — G8929 Other chronic pain: Secondary | ICD-10-CM

## 2014-05-03 DIAGNOSIS — M25562 Pain in left knee: Secondary | ICD-10-CM

## 2014-05-03 DIAGNOSIS — R262 Difficulty in walking, not elsewhere classified: Secondary | ICD-10-CM

## 2014-05-03 NOTE — Evaluation (Signed)
Physical Therapy Evaluation  Patient Details  Name: Michael [name] MRN: [id number] Date of Birth: 29-Jul-1946  Today's Date: 05/03/2014 Time: 0803-0850 PT Time Calculation (min): 47 min Charge:  Evaluation              Visit#: 1 of 16  Re-eval: 06/02/14 Assessment Diagnosis: Lt knee pain  Surgical Date: 12/09/12 Next MD Visit: 06/05/2014 Prior Therapy: 2014  Authorization: [employer]are    Authorization Visit#: 1 of 10   Past Medical History:  Past Medical History  Diagnosis Date  . COPD (chronic obstructive pulmonary disease)   . Diabetes mellitus   . Hyperlipidemia   . Hypertension   . Anemia   . NASH (nonalcoholic steatohepatitis)   . Diabetic neuropathy   . GERD (gastroesophageal reflux disease)   . Arthritis    Past Surgical History:  Past Surgical History  Procedure Laterality Date  . Hernia repair    . Shoulder surgery      right  . Laparoscopy      stomach  . Knee arthroscopy with medial menisectomy Left 11/06/2012    Procedure: KNEE ARTHROSCOPY WITH MEDIAL MENISECTOMY;  Surgeon: Carole Civil, MD;  Location: [facility];  Service: Orthopedics;  Laterality: Left;  . Lumbar laminectomy/decompression microdiscectomy Left 08/30/2013    Procedure: LUMBAR LAMINECTOMY/DECOMPRESSION MICRODISCECTOMY LEFT  LUMBAR TWO THREE;  Surgeon: Otilio Connors, MD;  Location: [facility];  Service: Neurosurgery;  Laterality: Left;    Subjective Symptoms/Limitations Symptoms: [name] states that he has had a meniscus repair in 2014 and went through therapy but his pain never got any better.  He has been using pain medication to get rest.  He recently has been to see Dr. Noemi Chapel who stated that he is not ready for a TKR and that he does not need arthroscopic surgery.  He did inject his knee with a steroid that helped his pain about 50% for three days but then the pain returned.   Pertinent History: arthroscopic surgery of Lt knee  2014; back surgery 2014. How long can you sit  comfortably?: thirty minutes How long can you stand comfortably?: 15 minutes with a cane How long can you walk comfortably?: with a cane x 5 minutes.  Patient Stated Goals: to have less pain, be able to walk longer and without his cane, be able stand longer.  Pain Assessment Currently in Pain?: Yes Pain Score: 6  (used voltaren gel ) Pain Location: Knee Pain Orientation: Left (more distal quad and quad tendon.) Pain Type: Chronic pain Pain Onset: More than a month ago Pain Frequency: Constant Pain Relieving Factors: pain medication  Effect of Pain on Daily Activities: increases   Balance Screening Balance Screen Has the patient fallen in the past 6 months: No  Prior Functioning  Prior Function Vocation: On disability Leisure: Hobbies-no    Sensation/Coordination/Flexibility/Functional Tests Flexibility Thomas: Positive Obers: Positive 90/90: Positive Functional Tests Functional Tests: foto 26  Assessment LLE AROM (degrees) Left Knee Extension: 5 Left Knee Flexion: 110 LLE Strength Left Hip Flexion: 2+/5 Left Hip Extension: 2-/5 Left Hip ABduction: 2+/5 Left Hip ADduction: 3-/5 Left Knee Flexion: 3+/5 Left Knee Extension: 3/5 Left Ankle Dorsiflexion: 3/5  Exercise/Treatments Mobility/Balance  Ambulation/Gait Ambulation/Gait: Yes Ambulation/Gait Assistance: 6: Modified independent (Device/Increase time) Assistive device: Small based quad cane Gait Pattern: Decreased step length - left;Decreased hip/knee flexion - left;Left circumduction    Sitting Long Arc Quad: 10 reps Other Seated Knee Exercises: Heel/toe raisex 10 Supine Quad Sets: 10 reps Heel Slides:  5 reps Straight Leg Raises: 5 reps     Physical Therapy Assessment and Plan PT Assessment and Plan Clinical Impression Statement: Pt is a 68 yo male who had arthroscopic surgery in March of 2014 but never had pain reduction.  He sought a second opinion who verified that he does not need a TKR.  He has  been sent to therapy to improve his functional ability.  Mr. Nou exam verifies decreased strength, extreme stiffness in his hips, altered gait pattern and increased pain.  He will benefit from skilled PT to address these issues and maximize his functional capability.   Pt will benefit from skilled therapeutic intervention in order to improve on the following deficits: Abnormal gait;Decreased activity tolerance;Decreased balance;Difficulty walking;Decreased strength;Pain;Decreased range of motion Rehab Potential: Good PT Frequency: Min 2X/week PT Duration: 8 weeks PT Treatment/Interventions: Gait training;Therapeutic activities;Therapeutic exercise;Balance training;Manual techniques;Patient/family education PT Plan: Pt to begin stretching, strengthening, balance  and gt training to return pt to maximal functional potential     Goals Home Exercise Program Pt/caregiver will Perform Home Exercise Program: For increased strengthening PT Goal: Perform Home Exercise Program - Progress: Goal set today PT Short Term Goals Time to Complete Short Term Goals: 2 weeks PT Short Term Goal 1: Pain level to be no greater than a 6/10 80% of the day. PT Short Term Goal 2: Pt to be ambulating in his home without his cane 50% of the time PT Short Term Goal 3: Pt to be able to sit for an hour without increased pain to be able to sit thru church service PT Long Term Goals Time to Complete Long Term Goals: 4 weeks PT Long Term Goal 1: I in advance HEP PT Long Term Goal 2: Pt to be able to stand for 15 minutes without increased pain to be able to make a small meal Long Term Goal 3: Pt to be able to ambulate inside without an assistive device. Long Term Goal 4: Pt to be able to walk for 15 minutes for better health habits.   Problem List Patient Active Problem List   Diagnosis Date Noted  . Difficulty in walking(719.7) 05/03/2014  . Knee pain, chronic 05/03/2014  . Stiffness of joint, not elsewhere  classified, pelvic region and thigh 05/03/2014  . Dermatitis 04/12/2014  . HNP (herniated nucleus pulposus), lumbar 08/30/2013  . Anemia 07/14/2013  . Spinal stenosis of lumbar region 07/06/2013  . Carpal tunnel syndrome 04/13/2013  . Peripheral edema 04/13/2013  . Status post arthroscopy of left knee 04/13/2013  . DDD (degenerative disc disease), lumbosacral 04/13/2013  . Peripheral neuropathy 01/05/2013  . S/P arthroscopy of left knee 11/09/2012  . Osteoarthritis of left knee 10/27/2012  . Meniscus tear 10/07/2012  . Patellar tendonitis 07/01/2012  . NASH (nonalcoholic steatohepatitis) 05/22/2007  . DIABETES MELLITUS, WITHOUT COMPLICATIONS 19/37/9024  . HYPERLIPIDEMIA 10/15/2006  . OBESITY, MORBID 10/15/2006  . HYPERTENSION 10/15/2006  . GERD 10/15/2006  . ARTHRITIS 10/15/2006    PT Plan of Care PT Home Exercise Plan: given  GP Functional Assessment Tool Used: foto  Functional Limitation: Mobility: Walking and moving around Mobility: Walking and Moving Around Current Status (O9735): At least 60 percent but less than 80 percent impaired, limited or restricted Mobility: Walking and Moving Around Goal Status [phone]): At least 40 percent but less than 60 percent impaired, limited or restricted  Basha Krygier,CINDY 05/03/2014, 9:23 AM  Physician Documentation Your signature is required to indicate approval of the treatment plan as stated above.  Please sign and  either send electronically or make a copy of this report for your files and return this physician signed original.   Please mark one 1.__approve of plan  2. ___approve of plan with the following conditions.   ______________________________                                                          _____________________ Physician Signature                                                                                                             Date

## 2014-05-04 ENCOUNTER — Ambulatory Visit (HOSPITAL_COMMUNITY)
Admission: RE | Admit: 2014-05-04 | Discharge: 2014-05-04 | Disposition: A | Discharge status: Home / Self Care | Payer: Commercial Managed Care - HMO | Source: Ambulatory Visit | Attending: Orthopedic Surgery | Admitting: Orthopedic Surgery

## 2014-05-04 DIAGNOSIS — G8929 Other chronic pain: Secondary | ICD-10-CM

## 2014-05-04 DIAGNOSIS — M25562 Pain in left knee: Secondary | ICD-10-CM

## 2014-05-04 DIAGNOSIS — R262 Difficulty in walking, not elsewhere classified: Secondary | ICD-10-CM

## 2014-05-04 DIAGNOSIS — M25659 Stiffness of unspecified hip, not elsewhere classified: Secondary | ICD-10-CM

## 2014-05-04 NOTE — Progress Notes (Signed)
Physical Therapy Treatment Patient Details  Name: Michael [name] MRN: [id number] Date of Birth: 06/10/1946  Today's Date: 05/04/2014 Time: 7473-4037 PT Time Calculation (min): 40 min Charge:  There ex [id number] Visit#: 2 of 16  Re-eval: 06/02/14   Subjective: Symptoms/Limitations Symptoms: Pt has no quesitons on exercises that he was given last treatment. Pain Assessment Pain Score: 6  Pain Location: Neck   Exercise/Treatments Mobility/Balance         Stretches Gastroc Stretch: 3 reps;30 seconds (slant board ) Soleus Stretch:  (passive stretching for hip ER, 3 x 1') Aerobic Stationary Bike: nustep hills (3)  level 4 x 8'   Standing Heel Raises: 10 reps Knee Flexion: 10 reps Terminal Knee Extension: 10 reps Rocker Board: 1 minute Seated Long Arc Quad: 10 reps;Weights Long Arc Quad Weight: 4 lbs. Supine Quad Sets: 10 reps Heel Slides: 10 reps Terminal Knee Extension: 10 reps    Physical Therapy Assessment and Plan PT Assessment and Plan Clinical Impression Statement: Pt has extreme tight hip mm.  Completed standing exercises with therapist faciilatiation for proper alignment.  PT Plan: begin prone exercises as well as SLS next treatment     Goals  progessing  Problem List Patient Active Problem List   Diagnosis Date Noted  . Difficulty in walking(719.7) 05/03/2014  . Knee pain, chronic 05/03/2014  . Stiffness of joint, not elsewhere classified, pelvic region and thigh 05/03/2014  . Dermatitis 04/12/2014  . HNP (herniated nucleus pulposus), lumbar 08/30/2013  . Anemia 07/14/2013  . Spinal stenosis of lumbar region 07/06/2013  . Carpal tunnel syndrome 04/13/2013  . Peripheral edema 04/13/2013  . Status post arthroscopy of left knee 04/13/2013  . DDD (degenerative disc disease), lumbosacral 04/13/2013  . Peripheral neuropathy 01/05/2013  . S/P arthroscopy of left knee 11/09/2012  . Osteoarthritis of left knee 10/27/2012  . Meniscus tear 10/07/2012  .  Patellar tendonitis 07/01/2012  . NASH (nonalcoholic steatohepatitis) 05/22/2007  . DIABETES MELLITUS, WITHOUT COMPLICATIONS 09/64/3838  . HYPERLIPIDEMIA 10/15/2006  . OBESITY, MORBID 10/15/2006  . HYPERTENSION 10/15/2006  . GERD 10/15/2006  . ARTHRITIS 10/15/2006       GP    RUSSELL,CINDY 05/04/2014, 3:31 PM

## 2014-05-10 ENCOUNTER — Ambulatory Visit (HOSPITAL_COMMUNITY): Payer: Commercial Managed Care - HMO | Admitting: Physical Therapy

## 2014-05-11 ENCOUNTER — Telehealth: Payer: Self-pay | Admitting: *Deleted

## 2014-05-11 ENCOUNTER — Telehealth: Payer: Self-pay | Admitting: Family Medicine

## 2014-05-11 ENCOUNTER — Other Ambulatory Visit: Payer: Self-pay | Admitting: Family Medicine

## 2014-05-11 DIAGNOSIS — D649 Anemia, unspecified: Secondary | ICD-10-CM

## 2014-05-11 LAB — BASIC METABOLIC PANEL
BUN: 10 mg/dL (ref 6–23)
CALCIUM: 9.5 mg/dL (ref 8.4–10.5)
CO2: 26 meq/L (ref 19–32)
Chloride: 93 mEq/L — ABNORMAL LOW (ref 96–112)
Creat: 0.76 mg/dL (ref 0.50–1.35)
Glucose, Bld: 132 mg/dL — ABNORMAL HIGH (ref 70–99)
POTASSIUM: 4.4 meq/L (ref 3.5–5.3)
SODIUM: 135 meq/L (ref 135–145)

## 2014-05-11 LAB — CBC WITH DIFFERENTIAL/PLATELET
Basophils Absolute: 0.1 10*3/uL (ref 0.0–0.1)
Basophils Relative: 2 % — ABNORMAL HIGH (ref 0–1)
Eosinophils Absolute: 0.1 10*3/uL (ref 0.0–0.7)
Eosinophils Relative: 2 % (ref 0–5)
HCT: 31.4 % — ABNORMAL LOW (ref 39.0–52.0)
HEMOGLOBIN: 9.1 g/dL — AB (ref 13.0–17.0)
LYMPHS ABS: 1.8 10*3/uL (ref 0.7–4.0)
Lymphocytes Relative: 25 % (ref 12–46)
MCH: 19.5 pg — ABNORMAL LOW (ref 26.0–34.0)
MCHC: 29 g/dL — ABNORMAL LOW (ref 30.0–36.0)
MCV: 67.4 fL — ABNORMAL LOW (ref 78.0–100.0)
MONOS PCT: 8 % (ref 3–12)
Monocytes Absolute: 0.6 10*3/uL (ref 0.1–1.0)
Neutro Abs: 4.5 10*3/uL (ref 1.7–7.7)
Neutrophils Relative %: 63 % (ref 43–77)
Platelets: 345 10*3/uL (ref 150–400)
RBC: 4.66 MIL/uL (ref 4.22–5.81)
RDW: 20.9 % — ABNORMAL HIGH (ref 11.5–15.5)
WBC: 7.1 10*3/uL (ref 4.0–10.5)

## 2014-05-11 NOTE — Telephone Encounter (Signed)
Place order for CBC/BMET - he can get this done in [place] if needed, have them send it STAT

## 2014-05-11 NOTE — Telephone Encounter (Signed)
Patient is calling back to talk with [place] about his test results

## 2014-05-11 NOTE — Telephone Encounter (Signed)
Orders placed.   Call placed to patient to make aware.   [place].

## 2014-05-11 NOTE — Telephone Encounter (Signed)
Pt called back and aware to have blood work done and can go to [facility], pt is going to try to go today.

## 2014-05-11 NOTE — Telephone Encounter (Signed)
Orders faxed to [facility].

## 2014-05-11 NOTE — Telephone Encounter (Signed)
Received call from patient.   Reports that GI consult is scheduled for 05/21/2014, but he is not feeling well. States that he is weak and fatigued. Reports that he is concerned that his anemia is worsened.   MD please advise.

## 2014-05-12 ENCOUNTER — Ambulatory Visit (HOSPITAL_COMMUNITY)
Admission: RE | Admit: 2014-05-12 | Discharge: 2014-05-12 | Disposition: A | Discharge status: Home / Self Care | Payer: Commercial Managed Care - HMO | Source: Ambulatory Visit | Attending: Orthopedic Surgery | Admitting: Orthopedic Surgery

## 2014-05-12 DIAGNOSIS — R262 Difficulty in walking, not elsewhere classified: Secondary | ICD-10-CM

## 2014-05-12 DIAGNOSIS — M25562 Pain in left knee: Secondary | ICD-10-CM

## 2014-05-12 DIAGNOSIS — G8929 Other chronic pain: Secondary | ICD-10-CM

## 2014-05-12 DIAGNOSIS — M25659 Stiffness of unspecified hip, not elsewhere classified: Secondary | ICD-10-CM

## 2014-05-12 NOTE — Progress Notes (Signed)
Physical Therapy Treatment Patient Details  Name: Michael [name] MRN: [id number] Date of Birth: 27-Jun-1946  Today's Date: 05/12/2014 Time: 7672-0947 PT Time Calculation (min): 45 min   Charges: [phone] Manual [id number] Visit#: 3 of 16  Re-eval: 06/02/14 Assessment Diagnosis: Lt knee pain  Surgical Date: 12/09/12 Next MD Visit: 06/05/2014 Prior Therapy: 2014  Authorization: [employer] medicare  Authorization Time Period:    Authorization Visit#: 3 of 10   Subjective: Symptoms/Limitations Symptoms: Patient returns to therapy reporting that his red blood cell count was critiacally low , having been hospitalized and now having improved though still low blood cell count patient returns to therapy noting that he is quick to fatigue and has contineud to have kne. pain.  Pertinent History: arthroscopic surgery of Lt knee  2014; back surgery 2014. How long can you sit comfortably?: thirty minutes How long can you stand comfortably?: 15 minutes with a cane How long can you walk comfortably?: with a cane x 5 minutes.  Patient Stated Goals: to have less pain, be able to walk longer and without his cane, be able stand longer.  Pain Assessment Currently in Pain?: Yes Pain Score: 5  Pain Location: Knee Pain Orientation: Left  Exercise/Treatments Stretches Active Hamstring Stretch: 3 reps;30 seconds Quad Stretch: 3 reps;30 seconds Hip Flexor Stretch: 3 reps;30 seconds Gastroc Stretch: 30 seconds;3 reps Aerobic Stationary Bike: nustep hills (3)  level 4 x 8' Standing Functional Squat: 10 reps   Manual therapy: therapist facilitated stretching of quad and rectus femoris, and hip flexors.   Physical Therapy Assessment and Plan PT Assessment and Plan Clinical Impression Statement: ptient continued to display forward flexion posture secondary to excessively limited hip flexor and quadraceps mobility.Therapy was limited in intensity due to patient's having low red blood cell count and  being quick to fatigue. Patient noted improved posture follwoign exercises PT Plan: begin prone exercises as well as SLS next treatement     Goals PT Short Term Goals PT Short Term Goal 1: Pain level to be no greater than a 6/10 80% of the day. PT Short Term Goal 1 - Progress: Progressing toward goal PT Short Term Goal 2: Pt to be ambulating in his home without his cane 50% of the time PT Short Term Goal 2 - Progress: Progressing toward goal PT Short Term Goal 3: Pt to be able to sit for an hour without increased pain to be able to sit thru church service PT Short Term Goal 3 - Progress: Progressing toward goal PT Long Term Goals PT Long Term Goal 1: I in advance HEP PT Long Term Goal 1 - Progress: Progressing toward goal PT Long Term Goal 2: Pt to be able to stand for 15 minutes without increased pain to be able to make a small meal PT Long Term Goal 2 - Progress: Progressing toward goal Long Term Goal 3: Pt to be able to ambulate inside without an assistive device. Long Term Goal 3 Progress: Progressing toward goal Long Term Goal 4: Pt to be able to walk for 15 minutes for better health habits.  Long Term Goal 4 Progress: Progressing toward goal  Problem List Patient Active Problem List   Diagnosis Date Noted  . Difficulty in walking(719.7) 05/03/2014  . Knee pain, chronic 05/03/2014  . Stiffness of joint, not elsewhere classified, pelvic region and thigh 05/03/2014  . Dermatitis 04/12/2014  . HNP (herniated nucleus pulposus), lumbar 08/30/2013  . Anemia 07/14/2013  . Spinal stenosis of lumbar region 07/06/2013  . Carpal  tunnel syndrome 04/13/2013  . Peripheral edema 04/13/2013  . Status post arthroscopy of left knee 04/13/2013  . DDD (degenerative disc disease), lumbosacral 04/13/2013  . Peripheral neuropathy 01/05/2013  . S/P arthroscopy of left knee 11/09/2012  . Osteoarthritis of left knee 10/27/2012  . Meniscus tear 10/07/2012  . Patellar tendonitis 07/01/2012  . NASH  (nonalcoholic steatohepatitis) 05/22/2007  . DIABETES MELLITUS, WITHOUT COMPLICATIONS 95/39/6728  . HYPERLIPIDEMIA 10/15/2006  . OBESITY, MORBID 10/15/2006  . HYPERTENSION 10/15/2006  . GERD 10/15/2006  . ARTHRITIS 10/15/2006    PT - End of Session Activity Tolerance: Patient tolerated treatment well General Behavior During Therapy: [facility] for tasks assessed/performed  GP    Elvera Almario R 05/12/2014, 6:54 PM

## 2014-05-16 ENCOUNTER — Other Ambulatory Visit: Payer: Self-pay | Admitting: Family Medicine

## 2014-05-16 ENCOUNTER — Inpatient Hospital Stay (HOSPITAL_COMMUNITY): Admission: RE | Admit: 2014-05-16 | Payer: Medicare HMO | Source: Ambulatory Visit | Admitting: Physical Therapy

## 2014-05-17 ENCOUNTER — Telehealth: Payer: Self-pay | Admitting: Family Medicine

## 2014-05-17 NOTE — Telephone Encounter (Signed)
Call placed to patient. LMTRC.  

## 2014-05-17 NOTE — Telephone Encounter (Signed)
Refill appropriate and filled per protocol. 

## 2014-05-17 NOTE — Telephone Encounter (Signed)
Patient is calling to say that [facility] sent him a letter saying that there was a recall on his hydrochlorothiazide and would like to talk to you about this    [phone]

## 2014-05-18 NOTE — Telephone Encounter (Signed)
Pt received a letter from [facility] stating that there could have been a mix up with his medications. It states that he could potentially have Plavix in his bottle with his HCTZ and it informed him to call his PCP. Spoke to pt has he has checked all his pills and they all match so he does not think he has taken any of the Plavix. Informed pt that there is not much to be done about it as long as he was not having any unusual bleeding. Pt states that he is not having any bleeding problems at all. He is scheduled for a colonoscopy next month. He is denies any blood in stool, blood in urine, stomach pain or unusual bruising. Informed pt that if any of these symptoms arise to please call our office back and to continue to check his pills for the next few months just precautionary. He verbalizes understanding.

## 2014-05-18 NOTE — Telephone Encounter (Signed)
Call placed to patient. LMTRC.  

## 2014-05-19 ENCOUNTER — Ambulatory Visit (HOSPITAL_COMMUNITY): Payer: Medicare HMO

## 2014-05-24 ENCOUNTER — Ambulatory Visit (HOSPITAL_COMMUNITY): Payer: Medicare HMO | Admitting: Physical Therapy

## 2014-05-25 ENCOUNTER — Other Ambulatory Visit: Payer: Self-pay | Admitting: Gastroenterology

## 2014-05-25 ENCOUNTER — Ambulatory Visit (INDEPENDENT_AMBULATORY_CARE_PROVIDER_SITE_OTHER): Payer: Commercial Managed Care - HMO | Admitting: Gastroenterology

## 2014-05-25 ENCOUNTER — Encounter: Payer: Self-pay | Admitting: Gastroenterology

## 2014-05-25 ENCOUNTER — Encounter (INDEPENDENT_AMBULATORY_CARE_PROVIDER_SITE_OTHER): Payer: Self-pay

## 2014-05-25 VITALS — BP 155/86 | HR 117 | Temp 97.9°F | Ht 62.0 in | Wt 212.4 lb

## 2014-05-25 DIAGNOSIS — D649 Anemia, unspecified: Secondary | ICD-10-CM

## 2014-05-25 DIAGNOSIS — R160 Hepatomegaly, not elsewhere classified: Secondary | ICD-10-CM

## 2014-05-25 DIAGNOSIS — K7689 Other specified diseases of liver: Secondary | ICD-10-CM | POA: Insufficient documentation

## 2014-05-25 NOTE — Progress Notes (Addendum)
Primary Care Physician:  Vic Blackbird, MD Primary Gastroenterologist:  Dr. Gala Romney  Chief Complaint  Patient presents with  . Anemia  . Colonoscopy    HPI:   Michael [name] presents today at the request of Dr. Buelah Manis secondary to anemia and need for colonoscopy. Patient reports colonoscopy/EGD by Dr. Gala Romney around 2005. Then repeat colonoscopy/EGD and capsule study in [place] about 2-3 years ago.  [facility]. Reports not available at time of visit. Last blood transfusion at [facility] in [place] a few years ago. Was off iron in July and dropped to 8 grams. Now 9.1 most recently. Appears he hangs around in the 9/10 range. Last Hematology in [place]. No hematochezia, melena. Has a large ventral hernia. Not an operable candidate. No N/V, reflux exacerbations. No esophageal dysphagia. Occasional Aleve but tries to avoid. No aspirin powders. Mobic daily. Prilosec BID.   Past Medical History  Diagnosis Date  . COPD (chronic obstructive pulmonary disease)   . Diabetes mellitus   . Hyperlipidemia   . Hypertension   . Anemia   . NASH (nonalcoholic steatohepatitis)   . Diabetic neuropathy   . GERD (gastroesophageal reflux disease)   . Arthritis     Past Surgical History  Procedure Laterality Date  . Hernia repair    . Shoulder surgery      right  . Lipoma removal      stomach  . Knee arthroscopy with medial menisectomy Left 11/06/2012    Procedure: KNEE ARTHROSCOPY WITH MEDIAL MENISECTOMY;  Surgeon: Carole Civil, MD;  Location: [facility];  Service: Orthopedics;  Laterality: Left;  . Lumbar laminectomy/decompression microdiscectomy Left 08/30/2013    Procedure: LUMBAR LAMINECTOMY/DECOMPRESSION MICRODISCECTOMY LEFT  LUMBAR TWO THREE;  Surgeon: Otilio Connors, MD;  Location: [place] [facility];  Service: Neurosurgery;  Laterality: Left;  . Egd with enteroscopy  2011    [facility]: normal esophagus and stomach. Normal duodenum, jejunum. No evidence of AVMs.   . Colonoscopy  2011      [facility]: normal colon, normal distal ileum    Current Outpatient Prescriptions  Medication Sig Dispense Refill  . amLODipine (NORVASC) 10 MG tablet TAKE 1 TABLET (10 MG TOTAL) BY MOUTH DAILY.  90 tablet  1  . aspirin EC 81 MG tablet Take 81 mg by mouth daily.      . benazepril (LOTENSIN) 40 MG tablet Take 1 tablet (40 mg total) by mouth daily.  30 tablet  3  . clobetasol cream (TEMOVATE) 3.53 % Apply 1 application topically 2 (two) times daily. Apply to scalp BID for dermatitis  30 g  3  . diclofenac sodium (VOLTAREN) 1 % GEL Apply 1 application topically 4 (four) times daily.  5 Tube  3  . glipiZIDE (GLUCOTROL) 10 MG tablet TAKE 1 TABLET BY MOUTH TWICE A DAY BEFORE MEALS  60 tablet  3  . hydrochlorothiazide (HYDRODIURIL) 25 MG tablet TAKE 1 TABLET BY MOUTH EVERY DAY  30 tablet  3  . meloxicam (MOBIC) 15 MG tablet Take 15 mg by mouth daily.       . metFORMIN (GLUCOPHAGE) 1000 MG tablet Take 1 tablet (1,000 mg total) by mouth 2 (two) times daily with a meal.  60 tablet  6  . omeprazole (PRILOSEC) 20 MG capsule Take 1 capsule (20 mg total) by mouth 2 (two) times daily.  60 capsule  6  . potassium chloride (K-DUR,KLOR-CON) 10 MEQ tablet Take 10 mEq by mouth daily as needed (when taking  torsemide).      . pravastatin (PRAVACHOL) 40 MG tablet TAKE 1 TABLET BY MOUTH EVERY DAY  30 tablet  3  . torsemide (DEMADEX) 20 MG tablet Take 20 mg by mouth daily as needed (swelling).      . B-D INS SYRINGE 0.5CC/31GX5/16 31G X 5/16" 0.5 ML MISC 23 UNITS BY DOES NOT APPLY ROUTE 2 (TWO) TIMES DAILY.  100 each  5  . gabapentin (NEURONTIN) 400 MG capsule TAKE 1 CAPSULE (400 MG TOTAL) BY MOUTH 2 TIMES DAILY.      Marland Kitchen HYDROcodone-acetaminophen (NORCO) 10-325 MG per tablet Take 1 tablet by mouth every 8 (eight) hours as needed.  90 tablet  0  . insulin aspart protamine- aspart (NOVOLOG MIX 70/30) (70-30) 100 UNIT/ML injection INJECT 20- 30 units INTO THE SKIN 2 (TWO) TIMES DAILY WITH A MEAL.  20 mL  6   No current  facility-administered medications for this visit.    Allergies as of 05/25/2014  . (No Known Allergies)    Family History  Problem Relation Age of Onset  . Heart disease Mother   . Hyperlipidemia Mother   . Hypertension Mother   . Depression Mother   . Diabetes Mother   . Heart disease Father   . Hypertension Father   . Hyperlipidemia Father   . Diabetes Father   . Heart disease Sister   . Hyperlipidemia Sister   . Hypertension Sister   . Diabetes Sister   . Diabetes Brother   . Heart disease Sister   . Hyperlipidemia Sister   . Hypertension Sister   . Heart disease Sister   . Hyperlipidemia Sister   . Hypertension Sister   . Diabetes Sister   . Diabetes Brother   . Rectal cancer Mother     History   Social History  . Marital Status: Widowed    Spouse Name: N/A    Number of Children: N/A  . Years of Education: 12   Occupational History  . Not on file.   Social History Main Topics  . Smoking status: Former Smoker -- 2.50 packs/day for 44 years  . Smokeless tobacco: Former Systems developer    Quit date: 09/30/2010  . Alcohol Use: No  . Drug Use: No  . Sexual Activity: Not on file   Other Topics Concern  . Not on file   Social History Narrative  . No narrative on file    Review of Systems: Gen: see HPI CV: Denies chest pain, heart palpitations, peripheral edema, syncope.  Resp: Denies shortness of breath at rest or with exertion. Denies wheezing or cough.  GI: see HPI GU : nocturnal urination MS: +joint pain, knees Derm: Denies rash, itching, dry skin Psych: Denies depression, anxiety, memory loss, and confusion Heme: Denies bruising, bleeding, and enlarged lymph nodes.  Physical Exam: BP 155/86  Pulse 117  Temp(Src) 97.9 F (36.6 C) (Oral)  Ht 5' 2"  (1.575 m)  Wt 212 lb 6.4 oz (96.344 kg)  BMI 38.84 kg/m2 General:   Alert and oriented. Pleasant and cooperative. Well-nourished and well-developed.  Head:  Normocephalic and atraumatic. Eyes:  Without  icterus, sclera clear and conjunctiva pink.  Ears:  Normal auditory acuity. Nose:  No deformity, discharge,  or lesions. Mouth:  No deformity or lesions, oral mucosa pink.  Lungs:  Clear to auscultation bilaterally. No wheezes, rales, or rhonchi. No distress.  Heart:  S1, S2 present without murmurs appreciated.  Abdomen:  +BS, soft, non-tender and non-distended. Ventral hernia. Query hepatomegaly  Rectal:  Deferred  Msk:  Symmetrical without gross deformities. Normal posture. Extremities:  1+ lower extremity edema Neurologic:  Alert and  oriented x4;  grossly normal neurologically. Skin:  Intact without significant lesions or rashes. Psych:  Alert and cooperative. Normal mood and affect.   Lab Results  Component Value Date   WBC 7.1 05/11/2014   HGB 9.1* 05/11/2014   HCT 31.4* 05/11/2014   MCV 67.4* 05/11/2014   PLT 345 05/11/2014   Lab Results  Component Value Date   ALT 44 04/18/2014   AST 30 04/18/2014   ALKPHOS 57 04/18/2014   BILITOT 0.4 04/18/2014   Lab Results  Component Value Date   IRON 20* 06/07/2014   TIBC 410 04/13/2013   FERRITIN 7* 06/07/2014

## 2014-05-25 NOTE — Patient Instructions (Signed)
Please have blood work completed when you are able. I am checking your iron levels specifically.  I need to request all of the records to see exactly what has been done.   In the meantime, let's get a special ultrasound of your liver to evaluate due to your history of fatty liver.

## 2014-05-26 ENCOUNTER — Telehealth: Payer: Self-pay | Admitting: *Deleted

## 2014-05-26 ENCOUNTER — Ambulatory Visit (HOSPITAL_COMMUNITY): Payer: Medicare HMO | Admitting: Physical Therapy

## 2014-05-26 NOTE — Telephone Encounter (Signed)
Release form was mailed to pt to get these records.

## 2014-05-26 NOTE — Telephone Encounter (Signed)
I believe it was at the [place]? In [place]?

## 2014-05-26 NOTE — Telephone Encounter (Signed)
Submitted [employer] referral thru acuity connect for authorization for [place] with approval number [id number], once received will fax to [facility].

## 2014-05-26 NOTE — Telephone Encounter (Signed)
[facility] called back and stated they do not have pt in their system.

## 2014-05-26 NOTE — Telephone Encounter (Signed)
Trish from [place] records called stating they do not have anything on this pt for a colonoscopy or a EGD Wannetta Sender stated she went as far back as 1997 and did not see anything. I called [place] and had to [facility].

## 2014-05-28 ENCOUNTER — Other Ambulatory Visit: Payer: Self-pay | Admitting: Family Medicine

## 2014-05-28 NOTE — Telephone Encounter (Signed)
Refill appropriate and filled per protocol. 

## 2014-05-31 DIAGNOSIS — D509 Iron deficiency anemia, unspecified: Secondary | ICD-10-CM | POA: Insufficient documentation

## 2014-05-31 NOTE — Assessment & Plan Note (Signed)
68 year old with history of IDA and reportedly an extensive work-up in [place] a few years ago. No overt signs of GI bleeding; need outside reports from the [place]. As of note, mother with history of rectal cancer. Check CBC, iron, ferritin now. Further recommendations after review of outside records. Will likely need TCS/EGD in near future.

## 2014-05-31 NOTE — Assessment & Plan Note (Signed)
On exam. Known fatty liver. Proceed with elastography.

## 2014-06-01 ENCOUNTER — Other Ambulatory Visit: Payer: Self-pay | Admitting: Family Medicine

## 2014-06-01 NOTE — Progress Notes (Signed)
Cc to pcp °

## 2014-06-07 LAB — IRON: IRON: 20 ug/dL — AB (ref 42–165)

## 2014-06-07 LAB — FERRITIN: Ferritin: 7 ng/mL — ABNORMAL LOW (ref 22–322)

## 2014-06-08 ENCOUNTER — Ambulatory Visit (HOSPITAL_COMMUNITY)
Admission: RE | Admit: 2014-06-08 | Discharge: 2014-06-08 | Disposition: A | Discharge status: Home / Self Care | Payer: Medicare HMO | Source: Ambulatory Visit | Attending: Gastroenterology | Admitting: Gastroenterology

## 2014-06-08 ENCOUNTER — Telehealth: Payer: Self-pay | Admitting: Gastroenterology

## 2014-06-08 DIAGNOSIS — R160 Hepatomegaly, not elsewhere classified: Secondary | ICD-10-CM | POA: Insufficient documentation

## 2014-06-08 DIAGNOSIS — K7689 Other specified diseases of liver: Secondary | ICD-10-CM | POA: Diagnosis present

## 2014-06-08 NOTE — Progress Notes (Signed)
Quick Note:  Metavir score of 4, which likely indicates early cirrhosis. Probably need to check viral markers. Other than that, his last LFTs are normal. Could hold off on in depth work-up, as this is likely NASH. If LFTs bumped, then I would check autoimmune/PBC serologies. For now, just viral markers.   ______

## 2014-06-08 NOTE — Progress Notes (Signed)
Quick Note:  Ferritin 7. Iron 20. IDA. I have requested medical records so we can plan on the next step. ______

## 2014-06-08 NOTE — Telephone Encounter (Signed)
Michael [name] mailed the release of information to the patient on 05/25/2014 and Patient brought me his signed release of information yesterday and I faxed it to the [facility] and it's marked that we need records ASAP. I will fax it again today.

## 2014-06-08 NOTE — Telephone Encounter (Signed)
We need to contact patient and find out where exactly he had his prior work-up. He had an extensive work-up at the [place] I believe.   Please call him and find out where his last EGD, TCS, capsule study was performed. Let's try to get the records ASAP if we can. Thanks!

## 2014-06-15 ENCOUNTER — Ambulatory Visit (INDEPENDENT_AMBULATORY_CARE_PROVIDER_SITE_OTHER): Payer: Commercial Managed Care - HMO | Admitting: Family Medicine

## 2014-06-15 ENCOUNTER — Encounter: Payer: Self-pay | Admitting: Family Medicine

## 2014-06-15 VITALS — BP 140/78 | HR 68 | Temp 98.3°F | Resp 16 | Ht 60.0 in | Wt 209.0 lb

## 2014-06-15 DIAGNOSIS — M1710 Unilateral primary osteoarthritis, unspecified knee: Secondary | ICD-10-CM

## 2014-06-15 DIAGNOSIS — R262 Difficulty in walking, not elsewhere classified: Secondary | ICD-10-CM

## 2014-06-15 DIAGNOSIS — M5137 Other intervertebral disc degeneration, lumbosacral region: Secondary | ICD-10-CM

## 2014-06-15 DIAGNOSIS — M1712 Unilateral primary osteoarthritis, left knee: Secondary | ICD-10-CM

## 2014-06-15 DIAGNOSIS — Z23 Encounter for immunization: Secondary | ICD-10-CM

## 2014-06-15 MED ORDER — HYDROCODONE-ACETAMINOPHEN 10-325 MG PO TABS: 1.0000 | ORAL_TABLET | Freq: Three times a day (TID) | ORAL | Status: DC | PRN

## 2014-06-15 MED ORDER — INSULIN ASPART PROT & ASPART (70-30 MIX) 100 UNIT/ML ~~LOC~~ SUSP: SUBCUTANEOUS | Status: DC

## 2014-06-15 NOTE — Progress Notes (Signed)
Patient ID: Michael [name], male   DOB: 1946/04/17, 68 y.o.   MRN: [id number]   Subjective:    Patient ID: Michael [name], male    DOB: [birth year], 68 y.o.   MRN: [id number]  Patient presents for B Leg Pain and Flu Vaccination  patient here with chronic knee pain. He's been seen by a [facility] he is status post a gel injection in both knees and they're waiting to see how he responds to this. He would like a refill on his hydrocodone which helps him on a regular basis achieve his ADLs. He also requests a shower bench as he is having difficulty bending his knee and up to get into the shower and also a grabber to help assist with dressing and picking up things around the house.  He's also due for flu shot    Review Of Systems:  GEN- denies fatigue, fever, weight loss,weakness, recent illness HEENT- denies eye drainage, change in vision, nasal discharge, CVS- denies chest pain, palpitations RESP- denies SOB, cough, wheeze ABD- denies N/V, change in stools, abd pain GU- denies dysuria, hematuria, dribbling, incontinence MSK- + joint pain, muscle aches, injury Neuro- denies headache, dizziness, syncope, seizure activity       Objective:    BP 140/78  Pulse 68  Temp(Src) 98.3 F (36.8 C) (Oral)  Resp 16  Ht 5' (1.524 m)  Wt 209 lb (94.802 kg)  BMI 40.82 kg/m2 GEN- NAD, alert and oriented x3 MSK- Decreased ROM bilat knees. Mild swelling of left knee, +crepitus Ext- trace pedal edema        Assessment & Plan:      Problem List Items Addressed This Visit   Osteoarthritis of left knee     I refilled his hydrocodone which keeps him a least active with his bilateral knee pain. He will continue following up with orthopedics I have given a prescription for this month as well as next month. I think he would benefit from the shower bench as well as a grabber to keep him active in his home    Relevant Medications      HYDROcodone-acetaminophen (NORCO) 10-325 MG per  tablet   Difficulty in walking(719.7)   DDD (degenerative disc disease), lumbosacral   Relevant Medications      HYDROcodone-acetaminophen (NORCO) 10-325 MG per tablet    Other Visit Diagnoses   Need for prophylactic vaccination and inoculation against influenza    -  Primary       Note: This dictation was prepared with Dragon dictation along with smaller phrase technology. Any transcriptional errors that result from this process are unintentional.

## 2014-06-15 NOTE — Assessment & Plan Note (Signed)
I refilled his hydrocodone which keeps him a least active with his bilateral knee pain. He will continue following up with orthopedics I have given a prescription for this month as well as next month. I think he would benefit from the shower bench as well as a grabber to keep him active in his home

## 2014-06-15 NOTE — Addendum Note (Signed)
Addended by: Vic Blackbird F on: 06/15/2014 02:09 PM   Modules accepted: Orders

## 2014-06-15 NOTE — Patient Instructions (Signed)
Flu shot given Pain medication given Continue current medications F/U as previous for diabetes

## 2014-06-17 ENCOUNTER — Encounter: Payer: Self-pay | Admitting: Gastroenterology

## 2014-06-17 NOTE — Progress Notes (Signed)
Patient had a colonoscopy and EGD with enteroscopy in 2011. No source for IDA at that time. I do not see where he had a capsule study.  Needs TCS/EGD with Dr. Gala Romney as soon as possible. His 30 days are almost up. It took awhile to get records as we had to have them mailed.   Will likely need capsule study later if TCS/EGD benign.

## 2014-06-17 NOTE — Progress Notes (Signed)
Quick Note:  Needs Hep B surface antigen and Hep C antibody ______

## 2014-06-20 NOTE — Progress Notes (Signed)
[facility]MR does not have anytime before his 30 days runs out. He will have to come back into the office.Please advise

## 2014-06-21 ENCOUNTER — Other Ambulatory Visit: Payer: Self-pay | Admitting: Family Medicine

## 2014-06-21 NOTE — Telephone Encounter (Signed)
Refill appropriate and filled per protocol. 

## 2014-06-23 ENCOUNTER — Encounter: Payer: Self-pay | Admitting: General Practice

## 2014-06-23 ENCOUNTER — Other Ambulatory Visit: Payer: Self-pay

## 2014-06-23 ENCOUNTER — Other Ambulatory Visit: Payer: Self-pay | Admitting: Gastroenterology

## 2014-06-23 ENCOUNTER — Telehealth: Payer: Self-pay | Admitting: General Practice

## 2014-06-23 DIAGNOSIS — K7460 Unspecified cirrhosis of liver: Secondary | ICD-10-CM

## 2014-06-23 NOTE — Progress Notes (Signed)
I called the patient and Michael [name]

## 2014-06-23 NOTE — Progress Notes (Signed)
Per Vicente Males the patient will need to come back in for an updated H&P, she will see him next week in an urgent appt slot.   He will not be charged for that visit since it took so long to get outside records.

## 2014-06-23 NOTE — Progress Notes (Signed)
Pt is aware of appt

## 2014-06-23 NOTE — Telephone Encounter (Signed)
I spoke with pt about results.

## 2014-06-23 NOTE — Progress Notes (Signed)
Pt has an appt 9/30 at 11:30am

## 2014-06-23 NOTE — Telephone Encounter (Signed)
Patient would like his lab results.    Routing to [employer]

## 2014-06-23 NOTE — Progress Notes (Signed)
Letter mailed concerning appt time

## 2014-06-29 ENCOUNTER — Ambulatory Visit (INDEPENDENT_AMBULATORY_CARE_PROVIDER_SITE_OTHER): Payer: Medicare HMO | Admitting: Gastroenterology

## 2014-06-29 ENCOUNTER — Other Ambulatory Visit: Payer: Self-pay

## 2014-06-29 ENCOUNTER — Encounter: Payer: Self-pay | Admitting: Gastroenterology

## 2014-06-29 VITALS — BP 142/80 | HR 76 | Temp 98.7°F | Ht 62.0 in | Wt 212.0 lb

## 2014-06-29 DIAGNOSIS — K7400 Hepatic fibrosis, unspecified: Secondary | ICD-10-CM

## 2014-06-29 DIAGNOSIS — D500 Iron deficiency anemia secondary to blood loss (chronic): Secondary | ICD-10-CM | POA: Insufficient documentation

## 2014-06-29 DIAGNOSIS — K7460 Unspecified cirrhosis of liver: Secondary | ICD-10-CM

## 2014-06-29 DIAGNOSIS — D509 Iron deficiency anemia, unspecified: Secondary | ICD-10-CM | POA: Insufficient documentation

## 2014-06-29 MED ORDER — PEG 3350-KCL-NA BICARB-NACL 420 G PO SOLR: 4000.0000 mL | ORAL | Status: DC

## 2014-06-29 NOTE — Progress Notes (Signed)
Referring Provider: [name], MD Primary Care Physician:  Vic Blackbird, MD Primary GI: Dr. Gala Romney   Chief Complaint  Patient presents with  . Follow-up    update H & P    HPI:   Michael [name] presents today for an H&P prior to colonoscopy/EGD due to IDA. He has a history of IDA with last GI evaluation in 2011 at the [facility]. At that time, EGD with enteroscopy showed normal esophagus, stomach, normal duodenum and jejunum. No evidence of AVMs. Colonoscopy normal with normal TI. Recent ferritin 7, iron 20. Hgb 9. [place] with elastography performed in September with Metavir score of 4. Needs viral markers. LFTs normal. Likely dealing with NASH.  States he woke up during the procedures in [place]. Needs Propofol. Some low-volume hematochezia. No melena. 3-4 soft bowel movements a day. On Meloxicam and Naproxen once a day. Prilosec BID. On iron once daily. Feels fatigued. Lower leg pain, knee pain. Receiving injections in knees.   Past Medical History  Diagnosis Date  . COPD (chronic obstructive pulmonary disease)   . Diabetes mellitus   . Hyperlipidemia   . Hypertension   . Anemia   . NASH (nonalcoholic steatohepatitis)   . Diabetic neuropathy   . GERD (gastroesophageal reflux disease)   . Arthritis     Past Surgical History  Procedure Laterality Date  . Hernia repair    . Shoulder surgery      right  . Lipoma removal      stomach  . Knee arthroscopy with medial menisectomy Left 11/06/2012    Procedure: KNEE ARTHROSCOPY WITH MEDIAL MENISECTOMY;  Surgeon: Carole Civil, MD;  Location: [facility];  Service: Orthopedics;  Laterality: Left;  . Lumbar laminectomy/decompression microdiscectomy Left 08/30/2013    Procedure: LUMBAR LAMINECTOMY/DECOMPRESSION MICRODISCECTOMY LEFT  LUMBAR TWO THREE;  Surgeon: Otilio Connors, MD;  Location: [place] [facility];  Service: Neurosurgery;  Laterality: Left;  . Egd with enteroscopy  2011    [facility]: normal esophagus and stomach. Normal  duodenum, jejunum. No evidence of AVMs.   . Colonoscopy  2011    [facility]: normal colon, normal distal ileum    Current Outpatient Prescriptions  Medication Sig Dispense Refill  . amLODipine (NORVASC) 10 MG tablet TAKE 1 TABLET (10 MG TOTAL) BY MOUTH DAILY.  90 tablet  1  . aspirin EC 81 MG tablet Take 81 mg by mouth daily.      . benazepril (LOTENSIN) 40 MG tablet Take 1 tablet (40 mg total) by mouth daily.  30 tablet  3  . clobetasol cream (TEMOVATE) 2.45 % Apply 1 application topically 2 (two) times daily. Apply to scalp BID for dermatitis  30 g  3  . diclofenac sodium (VOLTAREN) 1 % GEL Apply 1 application topically 4 (four) times daily.  5 Tube  3  . Ferrous Sulfate (IRON) 325 (65 FE) MG TABS Take by mouth.      . gabapentin (NEURONTIN) 400 MG capsule TAKE 1 CAPSULE (400 MG TOTAL) BY MOUTH 2 TIMES DAILY.      Marland Kitchen glipiZIDE (GLUCOTROL) 10 MG tablet TAKE 1 TABLET BY MOUTH TWICE A DAY BEFORE MEALS  60 tablet  3  . hydrochlorothiazide (HYDRODIURIL) 25 MG tablet TAKE 1 TABLET BY MOUTH EVERY DAY  30 tablet  3  . HYDROcodone-acetaminophen (NORCO) 10-325 MG per tablet Take 1 tablet by mouth every 8 (eight) hours as needed.  90 tablet  0  . insulin aspart protamine- aspart (  NOVOLOG MIX 70/30) (70-30) 100 UNIT/ML injection INJECT 20- 30 units INTO THE SKIN 2 (TWO) TIMES DAILY WITH A MEAL.  20 mL  6  . meloxicam (MOBIC) 15 MG tablet Take 15 mg by mouth daily.       . metFORMIN (GLUCOPHAGE) 1000 MG tablet Take 1 tablet (1,000 mg total) by mouth 2 (two) times daily with a meal.  60 tablet  6  . naproxen (NAPROSYN) 375 MG tablet TAKE 1 TABLET BY MOUTH TWICE A DAY WITH FOOD  60 tablet  6  . omeprazole (PRILOSEC) 20 MG capsule Take 1 capsule (20 mg total) by mouth 2 (two) times daily.  60 capsule  6  . potassium chloride (K-DUR,KLOR-CON) 10 MEQ tablet Take 10 mEq by mouth daily as needed (when taking torsemide).      . pravastatin (PRAVACHOL) 40 MG tablet TAKE 1 TABLET BY MOUTH EVERY DAY  30 tablet  3  .  torsemide (DEMADEX) 20 MG tablet Take 20 mg by mouth daily as needed (swelling).      . B-D INS SYRINGE 0.5CC/31GX5/16 31G X 5/16" 0.5 ML MISC 23 UNITS BY DOES NOT APPLY ROUTE 2 (TWO) TIMES DAILY.  100 each  5   No current facility-administered medications for this visit.    Allergies as of 06/29/2014  . (No Known Allergies)    Family History  Problem Relation Age of Onset  . Heart disease Mother   . Hyperlipidemia Mother   . Hypertension Mother   . Depression Mother   . Diabetes Mother   . Heart disease Father   . Hypertension Father   . Hyperlipidemia Father   . Diabetes Father   . Heart disease Sister   . Hyperlipidemia Sister   . Hypertension Sister   . Diabetes Sister   . Diabetes Brother   . Heart disease Sister   . Hyperlipidemia Sister   . Hypertension Sister   . Heart disease Sister   . Hyperlipidemia Sister   . Hypertension Sister   . Diabetes Sister   . Diabetes Brother   . Rectal cancer Mother     History   Social History  . Marital Status: Widowed    Spouse Name: N/A    Number of Children: N/A  . Years of Education: 46   Social History Main Topics  . Smoking status: Former Smoker -- 2.50 packs/day for 44 years  . Smokeless tobacco: Former Systems developer    Quit date: 09/30/2010  . Alcohol Use: No  . Drug Use: No  . Sexual Activity: None   Other Topics Concern  . None   Social History Narrative  . None    Review of Systems: As mentioned in HPI  Physical Exam: BP 142/80  Pulse 76  Temp(Src) 98.7 F (37.1 C) (Oral)  Ht 5' 2"  (1.575 m)  Wt 212 lb (96.163 kg)  BMI 38.77 kg/m2 General:   Alert and oriented. No distress noted. Pleasant and cooperative.  Head:  Normocephalic and atraumatic. Eyes:  Conjuctiva clear without scleral icterus. Mouth:  Oral mucosa pink and moist.  Heart:  S1, S2 present without murmurs, rubs, or gallops. Regular rate and rhythm. Abdomen:  +BS, soft, non-tender and non-distended. No rebound or guarding. Hepatomegaly.    Msk:  Stooped over, shuffling gait Extremities:  1+ edema Neurologic:  Alert and  oriented x4;  grossly normal neurologically. Skin:  Intact without significant lesions or rashes. Psych:  Alert and cooperative. Normal mood and affect.  Lab Results  Component  Value Date   WBC 7.1 05/11/2014   HGB 9.1* 05/11/2014   HCT 31.4* 05/11/2014   MCV 67.4* 05/11/2014   PLT 345 05/11/2014   Lab Results  Component Value Date   IRON 20* 06/07/2014   TIBC 410 04/13/2013   FERRITIN 7* 06/07/2014   Lab Results  Component Value Date   ALT 44 04/18/2014   AST 30 04/18/2014   ALKPHOS 57 04/18/2014   BILITOT 0.4 04/18/2014

## 2014-06-29 NOTE — Assessment & Plan Note (Addendum)
68 year old male with IDA, prior work-up in 2011 at [facility] with negative EGD/enteroscopy and colonoscopy. Patient reports capsule study done at this time but no records available. No overt signs of GI bleeding or concerning GI symptoms. On Mobic AND naprosyn, which could cause insult anywhere in the GI tract. Mother with history of rectal cancer. Prior history of failed sedation.  Proceed with TCS/EGD with Dr. Gala Romney in near future: the risks, benefits, and alternatives have been discussed with the patient in detail. The patient states understanding and desires to proceed. PROPOFOL for sedation Limit naprosyn and Mobic May ultimately need capsule study Increase iron to BID

## 2014-06-29 NOTE — Progress Notes (Signed)
cc'ed to pcp °

## 2014-06-29 NOTE — Patient Instructions (Signed)
Increase iron to twice a day.   We have scheduled you for a colonoscopy and upper endoscopy with Dr. Gala Romney.   Take 1/2 dose of glipizide the evening before the procedure, take 1/2 dose of metformin the evening before the procedure, take 1/2 dose of Novolog the evening before the procedure. NO diabetes medication the day of the procedure.

## 2014-06-29 NOTE — Assessment & Plan Note (Signed)
elastography ordered due to hepatomegaly. Metavir score of F4. Obtain viral markers. LFTs normal. Likely secondary to NASH. EGD already planned due to IDA. Needs [place] every 6 months and hepatitis vaccinations. Prescription provided.

## 2014-06-30 LAB — HEPATITIS B SURFACE ANTIGEN: Hepatitis B Surface Ag: NEGATIVE

## 2014-06-30 LAB — HEPATITIS C ANTIBODY: HCV Ab: NEGATIVE

## 2014-07-04 NOTE — Progress Notes (Signed)
Quick Note:  Viral markers negative. Likely fibrosis secondary to NASH, possible early cirrhosis.  EGD/colonoscopy upcoming. ______

## 2014-07-05 ENCOUNTER — Encounter (HOSPITAL_COMMUNITY): Payer: Self-pay | Admitting: Pharmacy Technician

## 2014-07-13 ENCOUNTER — Encounter: Payer: Self-pay | Admitting: Family Medicine

## 2014-07-13 ENCOUNTER — Ambulatory Visit (INDEPENDENT_AMBULATORY_CARE_PROVIDER_SITE_OTHER): Payer: Commercial Managed Care - HMO | Admitting: Family Medicine

## 2014-07-13 VITALS — BP 130/82 | HR 68 | Temp 98.0°F | Resp 16 | Ht 60.0 in | Wt 212.0 lb

## 2014-07-13 DIAGNOSIS — K7400 Hepatic fibrosis, unspecified: Secondary | ICD-10-CM

## 2014-07-13 DIAGNOSIS — E1149 Type 2 diabetes mellitus with other diabetic neurological complication: Secondary | ICD-10-CM

## 2014-07-13 DIAGNOSIS — E785 Hyperlipidemia, unspecified: Secondary | ICD-10-CM

## 2014-07-13 DIAGNOSIS — Z23 Encounter for immunization: Secondary | ICD-10-CM

## 2014-07-13 DIAGNOSIS — M1712 Unilateral primary osteoarthritis, left knee: Secondary | ICD-10-CM

## 2014-07-13 DIAGNOSIS — G629 Polyneuropathy, unspecified: Secondary | ICD-10-CM

## 2014-07-13 DIAGNOSIS — K7581 Nonalcoholic steatohepatitis (NASH): Secondary | ICD-10-CM

## 2014-07-13 DIAGNOSIS — E1140 Type 2 diabetes mellitus with diabetic neuropathy, unspecified: Secondary | ICD-10-CM

## 2014-07-13 DIAGNOSIS — I10 Essential (primary) hypertension: Secondary | ICD-10-CM

## 2014-07-13 MED ORDER — HYDROCODONE-ACETAMINOPHEN 10-325 MG PO TABS: 1.0000 | ORAL_TABLET | Freq: Four times a day (QID) | ORAL | Status: DC | PRN

## 2014-07-13 NOTE — Assessment & Plan Note (Signed)
I will obtain the labs from the [facility] his last A1c was 7.1% just 2 months ago. No changes were made to his medications. Regarding her home health aide I think that he would benefit from having a to the home to assist him with activities of daily living he is significant amount of difficulty getting around his home as well as leaving him to come to doctor's appointment treatment to get to a grocery store. He would benefit from the shower stool and bedside commode as well to assist him in the home. His balance is poor due to the OA of his knees and his gait is unstable. He will start using the walker for the time being to assist with ambulation

## 2014-07-13 NOTE — Patient Instructions (Signed)
Continue current meds Release of records- Last set of labs from [place]  Hepatitis B/A vaccine given  F/U 3 months

## 2014-07-13 NOTE — Assessment & Plan Note (Signed)
Well controlled no change to meds

## 2014-07-13 NOTE — Assessment & Plan Note (Signed)
Chronic knee pain, pain meds given for Oct , Nov  F/u with ortho

## 2014-07-13 NOTE — Progress Notes (Signed)
Patient ID: Michael [name], male   DOB: Mar 03, 1946, 68 y.o.   MRN: [id number]   Subjective:    Patient ID: Michael [name], male    DOB: 1946-05-25, 68 y.o.   MRN: [id number]  Patient presents for 3 month F/U  patient seen by his physician at the [facility] had blood work done there he states his A1c was 7% he also had cholesterol done which was normal and he was told that his anemia was good. He was seen by gastroenterology they're planning for colonoscopy. There is concern for non-alcoholic steatohepatitis it was recommended that he get the vaccination for hepatitis A and B. which she was very weary about therefore he came into this visit did discuss this. He also continues to have difficulties regarding his knee pain he was seen by orthopedics had gel injections done continues to have significant pain he is using his cane but feels very unsteady with this. We also tried to get him some eighth at home to assist him in the rest room however this was denied by Medicare but the [employer] will pick up the For this. He also requests a nurse aide to assist him at home because of his declining health and joint issues.    Review Of Systems:  GEN- denies fatigue, fever, weight loss,weakness, recent illness HEENT- denies eye drainage, change in vision, nasal discharge, CVS- denies chest pain, palpitations RESP- denies SOB, cough, wheeze ABD- denies N/V, change in stools, abd pain GU- denies dysuria, hematuria, dribbling, incontinence MSK- + joint pain, muscle aches, injury Neuro- denies headache, dizziness, syncope, seizure activity       Objective:    BP 130/82  Pulse 68  Temp(Src) 98 F (36.7 C) (Oral)  Resp 16  Ht 5' (1.524 m)  Wt 212 lb (96.163 kg)  BMI 41.40 kg/m2 GEN- NAD, alert and oriented x3 HEENT- PERRL, EOMI, non injected sclera, pink conjunctiva, MMM, oropharynx clear Neck- Supple,  CVS- RRR, no murmur RESP-CTAB MSK- Decreased ROM bilat knees, no  effusion, antalgic gait, walks with cane- unsteady, Decreased ROM Spine EXT- trace edema Pulses- Radial, DP- 2+        Assessment & Plan:      Problem List Items Addressed This Visit   Type II diabetes mellitus with neurological manifestations - Primary     I will obtain the labs from the [facility] his last A1c was 7.1% just 2 months ago. No changes were made to his medications. Regarding her home health aide I think that he would benefit from having a to the home to assist him with activities of daily living he is significant amount of difficulty getting around his home as well as leaving him to come to doctor's appointment treatment to get to a grocery store. He would benefit from the shower stool and bedside commode as well to assist him in the home. His balance is poor due to the OA of his knees and his gait is unstable. He will start using the walker for the time being to assist with ambulation    Peripheral neuropathy   Osteoarthritis of left knee     Chronic knee pain, pain meds given for Oct , Nov  F/u with ortho    Relevant Medications      HYDROcodone-acetaminophen (NORCO) 10-325 MG per tablet   OBESITY, MORBID   NASH (nonalcoholic steatohepatitis)     Hep A and B Vaccine Given    Liver fibrosis   Hyperlipidemia  Essential hypertension    Other Visit Diagnoses   Need for prophylactic vaccination and inoculation against viral hepatitis        Relevant Orders       Hepatitis A hepatitis B combined vaccine IM (Completed)       Note: This dictation was prepared with Dragon dictation along with smaller phrase technology. Any transcriptional errors that result from this process are unintentional.

## 2014-07-13 NOTE — Assessment & Plan Note (Signed)
Hep A and B Vaccine Given

## 2014-07-15 ENCOUNTER — Other Ambulatory Visit: Payer: Self-pay

## 2014-07-15 ENCOUNTER — Encounter (HOSPITAL_COMMUNITY)
Admission: RE | Admit: 2014-07-15 | Discharge: 2014-07-15 | Disposition: A | Discharge status: Home / Self Care | Payer: Medicare HMO | Source: Ambulatory Visit | Attending: Internal Medicine | Admitting: Internal Medicine

## 2014-07-15 ENCOUNTER — Encounter (HOSPITAL_COMMUNITY): Payer: Self-pay

## 2014-07-15 DIAGNOSIS — Z01818 Encounter for other preprocedural examination: Secondary | ICD-10-CM | POA: Diagnosis not present

## 2014-07-15 DIAGNOSIS — R000 Tachycardia, unspecified: Secondary | ICD-10-CM | POA: Diagnosis not present

## 2014-07-15 HISTORY — DX: Major depressive disorder, single episode, unspecified: F32.9

## 2014-07-15 HISTORY — DX: Depression, unspecified: F32.A

## 2014-07-15 LAB — BASIC METABOLIC PANEL
Anion gap: 15 (ref 5–15)
BUN: 12 mg/dL (ref 6–23)
CO2: 25 mEq/L (ref 19–32)
Calcium: 9.7 mg/dL (ref 8.4–10.5)
Chloride: 92 mEq/L — ABNORMAL LOW (ref 96–112)
Creatinine, Ser: 0.72 mg/dL (ref 0.50–1.35)
GFR calc non Af Amer: >90 mL/min (ref >90)
GLUCOSE: 169 mg/dL — AB (ref 70–99)
POTASSIUM: 4.5 meq/L (ref 3.7–5.3)
Sodium: 132 mEq/L — ABNORMAL LOW (ref 137–147)

## 2014-07-15 LAB — HEMOGLOBIN AND HEMATOCRIT, BLOOD
HCT: 33.5 % — ABNORMAL LOW (ref 39.0–52.0)
HEMOGLOBIN: 10.3 g/dL — AB (ref 13.0–17.0)

## 2014-07-15 NOTE — Patient Instructions (Signed)
AZUL BRUMETT  07/15/2014   Your procedure is scheduled on:  07/21/2014  Report to [facility] at  43 AM    Call this number if you have problems the morning of surgery: [phone]   Remember:   Do not eat food or drink liquids after midnight.   Take these medicines the morning of surgery with A SIP OF WATER:  Norvasc, benazepril, gabapentin, hydrocodone, mobic, prilosec   Do not wear jewelry, make-up or nail polish.  Do not wear lotions, powders, or perfumes.  Do not shave 48 hours prior to surgery. Men may shave face and neck.  Do not bring valuables to the hospital.  [facility] is not responsible  for any belongings or valuables.               Contacts, dentures or bridgework may not be worn into surgery.  Leave suitcase in the car. After surgery it may be brought to your room.  For patients admitted to the hospital, discharge time is determined by your treatment team.               Patients discharged the day of surgery will not be allowed to drive home.  Name and phone number of your driver: family  Special Instructions: N/A   Please read over the following fact sheets that you were given: Pain Booklet, Coughing and Deep Breathing, Surgical Site Infection Prevention, Anesthesia Post-op Instructions and Care and Recovery After Surgery Esophagogastroduodenoscopy Esophagogastroduodenoscopy (EGD) is a procedure to examine the lining of the esophagus, stomach, and first part of the small intestine (duodenum). A long, flexible, lighted tube with a camera attached (endoscope) is inserted down the throat to view these organs. This procedure is done to detect problems or abnormalities, such as inflammation, bleeding, ulcers, or growths, in order to treat them. The procedure lasts about 5-20 minutes. It is usually an outpatient procedure, but it may need to be performed in emergency cases in the hospital. LET YOUR CAREGIVER KNOW ABOUT:   Allergies to food or  medicine.  All medicines you are taking, including vitamins, herbs, eyedrops, and over-the-counter medicines and creams.  Use of steroids (by mouth or creams).  Previous problems you or members of your family have had with the use of anesthetics.  Any blood disorders you have.  Previous surgeries you have had.  Other health problems you have.  Possibility of pregnancy, if this applies. RISKS AND COMPLICATIONS  Generally, EGD is a safe procedure. However, as with any procedure, complications can occur. Possible complications include:  Infection.  Bleeding.  Tearing (perforation) of the esophagus, stomach, or duodenum.  Difficulty breathing or not being able to breath.  Excessive sweating.  Spasms of the larynx.  Slowed heartbeat.  Low blood pressure. BEFORE THE PROCEDURE  Do not eat or drink anything for 6-8 hours before the procedure or as directed by your caregiver.  Ask your caregiver about changing or stopping your regular medicines.  If you wear dentures, be prepared to remove them before the procedure.  Arrange for someone to drive you home after the procedure. PROCEDURE   A vein will be accessed to give medicines and fluids. A medicine to relax you (sedative) and a pain reliever will be given through that access into the vein.  A numbing medicine (local anesthetic) may be sprayed on your throat for comfort and to stop you from gagging or coughing.  A mouth guard may be placed in your  mouth to protect your teeth and to keep you from biting on the endoscope.  You will be asked to lie on your left side.  The endoscope is inserted down your throat and into the esophagus, stomach, and duodenum.  Air is put through the endoscope to allow your caregiver to view the lining of your esophagus clearly.  The esophagus, stomach, and duodenum is then examined. During the exam, your caregiver may:  Remove tissue to be examined under a microscope (biopsy) for  inflammation, infection, or other medical problems.  Remove growths.  Remove objects (foreign bodies) that are stuck.  Treat any bleeding with medicines or other devices that stop tissues from bleeding (hot cautery, clipping devices).  Widen (dilate) or stretch narrowed areas of the esophagus and stomach.  The endoscope will then be withdrawn. AFTER THE PROCEDURE  You will be taken to a recovery area to be monitored. You will be able to go home once you are stable and alert.  Do not eat or drink anything until the local anesthetic and numbing medicines have worn off. You may choke.  It is normal to feel bloated, have pain with swallowing, or have a sore throat for a short time. This will wear off.  Your caregiver should be able to discuss his or her findings with you. It will take longer to discuss the test results if any biopsies were taken. Document Released: 01/17/2005 Document Revised: 01/31/2014 Document Reviewed: 08/19/2012 [facility] Patient Information 2015 [place], [place]. This information is not intended to replace advice given to you by your health care provider. Make sure you discuss any questions you have with your health care provider. Colonoscopy A colonoscopy is an exam to look at the entire large intestine (colon). This exam can help find problems such as tumors, polyps, inflammation, and areas of bleeding. The exam takes about 1 hour.  LET [facility] CARE PROVIDER KNOW ABOUT:   Any allergies you have.  All medicines you are taking, including vitamins, herbs, eye drops, creams, and over-the-counter medicines.  Previous problems you or members of your family have had with the use of anesthetics.  Any blood disorders you have.  Previous surgeries you have had.  Medical conditions you have. RISKS AND COMPLICATIONS  Generally, this is a safe procedure. However, as with any procedure, complications can occur. Possible complications include:  Bleeding.  Tearing or  rupture of the colon wall.  Reaction to medicines given during the exam.  Infection (rare). BEFORE THE PROCEDURE   Ask your health care provider about changing or stopping your regular medicines.  You may be prescribed an oral bowel prep. This involves drinking a large amount of medicated liquid, starting the day before your procedure. The liquid will cause you to have multiple loose stools until your stool is almost clear or light green. This cleans out your colon in preparation for the procedure.  Do not eat or drink anything else once you have started the bowel prep, unless your health care provider tells you it is safe to do so.  Arrange for someone to drive you home after the procedure. PROCEDURE   You will be given medicine to help you relax (sedative).  You will lie on your side with your knees bent.  A long, flexible tube with a light and camera on the end (colonoscope) will be inserted through the rectum and into the colon. The camera sends video back to a computer screen as it moves through the colon. The colonoscope also releases  carbon dioxide gas to inflate the colon. This helps your health care provider see the area better.  During the exam, your health care provider may take a small tissue sample (biopsy) to be examined under a microscope if any abnormalities are found.  The exam is finished when the entire colon has been viewed. AFTER THE PROCEDURE   Do not drive for 24 hours after the exam.  You may have a small amount of blood in your stool.  You may pass moderate amounts of gas and have mild abdominal cramping or bloating. This is caused by the gas used to inflate your colon during the exam.  Ask when your test results will be ready and how you will get your results. Make sure you get your test results. Document Released: 09/13/2000 Document Revised: 07/07/2013 Document Reviewed: 05/24/2013 [facility] Patient Information 2015 [place], [place]. This information is  not intended to replace advice given to you by your health care provider. Make sure you discuss any questions you have with your health care provider. PATIENT INSTRUCTIONS POST-ANESTHESIA  IMMEDIATELY FOLLOWING SURGERY:  Do not drive or operate machinery for the first twenty four hours after surgery.  Do not make any important decisions for twenty four hours after surgery or while taking narcotic pain medications or sedatives.  If you develop intractable nausea and vomiting or a severe headache please notify your doctor immediately.  FOLLOW-UP:  Please make an appointment with your surgeon as instructed. You do not need to follow up with anesthesia unless specifically instructed to do so.  WOUND CARE INSTRUCTIONS (if applicable):  Keep a dry clean dressing on the anesthesia/puncture wound site if there is drainage.  Once the wound has quit draining you may leave it open to air.  Generally you should leave the bandage intact for twenty four hours unless there is drainage.  If the epidural site drains for more than 36-48 hours please call the anesthesia department.  QUESTIONS?:  Please feel free to call your physician or the hospital operator if you have any questions, and they will be happy to assist you.

## 2014-07-15 NOTE — Progress Notes (Signed)
07/15/14 1013  OBSTRUCTIVE SLEEP APNEA  Have you ever been diagnosed with sleep apnea through a sleep study? No  Do you snore loudly (loud enough to be heard through closed doors)?  0  Do you often feel tired, fatigued, or sleepy during the daytime? 0  Has anyone observed you stop breathing during your sleep? 0  Do you have, or are you being treated for high blood pressure? 1  BMI more than 35 kg/m2? 1  Age over 67 years old? 1  Neck circumference greater than 40 cm/16 inches? 0  Gender: 1  Obstructive Sleep Apnea Score 4  Score 4 or greater  Results sent to PCP

## 2014-07-15 NOTE — Pre-Procedure Instructions (Signed)
Patient given information to sign up for my chart at home. 

## 2014-07-21 ENCOUNTER — Ambulatory Visit: Admit: 2014-07-21 | Payer: Self-pay | Admitting: Internal Medicine

## 2014-07-21 ENCOUNTER — Ambulatory Visit (HOSPITAL_COMMUNITY): Payer: Medicare HMO | Admitting: Certified Registered Nurse Anesthetist

## 2014-07-21 ENCOUNTER — Observation Stay (HOSPITAL_COMMUNITY)
Admission: RE | Admit: 2014-07-21 | Discharge: 2014-07-22 | Disposition: A | Discharge status: Home / Self Care | Payer: Medicare HMO | Source: Ambulatory Visit | Attending: Internal Medicine | Admitting: Internal Medicine

## 2014-07-21 ENCOUNTER — Encounter (HOSPITAL_COMMUNITY)
Admission: RE | Disposition: A | Discharge status: Home / Self Care | Payer: Self-pay | Source: Ambulatory Visit | Attending: Internal Medicine

## 2014-07-21 ENCOUNTER — Other Ambulatory Visit: Payer: Self-pay

## 2014-07-21 ENCOUNTER — Encounter (HOSPITAL_COMMUNITY): Payer: Self-pay | Admitting: *Deleted

## 2014-07-21 DIAGNOSIS — Z87891 Personal history of nicotine dependence: Secondary | ICD-10-CM | POA: Insufficient documentation

## 2014-07-21 DIAGNOSIS — E1140 Type 2 diabetes mellitus with diabetic neuropathy, unspecified: Secondary | ICD-10-CM

## 2014-07-21 DIAGNOSIS — D649 Anemia, unspecified: Secondary | ICD-10-CM | POA: Diagnosis present

## 2014-07-21 DIAGNOSIS — G629 Polyneuropathy, unspecified: Secondary | ICD-10-CM

## 2014-07-21 DIAGNOSIS — L309 Dermatitis, unspecified: Secondary | ICD-10-CM

## 2014-07-21 DIAGNOSIS — I10 Essential (primary) hypertension: Secondary | ICD-10-CM | POA: Diagnosis not present

## 2014-07-21 DIAGNOSIS — E66813 Obesity, class 3: Secondary | ICD-10-CM | POA: Diagnosis present

## 2014-07-21 DIAGNOSIS — K3189 Other diseases of stomach and duodenum: Secondary | ICD-10-CM

## 2014-07-21 DIAGNOSIS — K2970 Gastritis, unspecified, without bleeding: Secondary | ICD-10-CM | POA: Diagnosis not present

## 2014-07-21 DIAGNOSIS — R600 Localized edema: Secondary | ICD-10-CM

## 2014-07-21 DIAGNOSIS — F329 Major depressive disorder, single episode, unspecified: Secondary | ICD-10-CM | POA: Insufficient documentation

## 2014-07-21 DIAGNOSIS — D509 Iron deficiency anemia, unspecified: Secondary | ICD-10-CM | POA: Diagnosis present

## 2014-07-21 DIAGNOSIS — R160 Hepatomegaly, not elsewhere classified: Secondary | ICD-10-CM

## 2014-07-21 DIAGNOSIS — J449 Chronic obstructive pulmonary disease, unspecified: Secondary | ICD-10-CM | POA: Insufficient documentation

## 2014-07-21 DIAGNOSIS — K7400 Hepatic fibrosis, unspecified: Secondary | ICD-10-CM

## 2014-07-21 DIAGNOSIS — K219 Gastro-esophageal reflux disease without esophagitis: Secondary | ICD-10-CM | POA: Diagnosis present

## 2014-07-21 DIAGNOSIS — M5126 Other intervertebral disc displacement, lumbar region: Secondary | ICD-10-CM

## 2014-07-21 DIAGNOSIS — M48061 Spinal stenosis, lumbar region without neurogenic claudication: Secondary | ICD-10-CM

## 2014-07-21 DIAGNOSIS — E1149 Type 2 diabetes mellitus with other diabetic neurological complication: Secondary | ICD-10-CM

## 2014-07-21 DIAGNOSIS — Z860100 Personal history of colon polyps, unspecified: Secondary | ICD-10-CM

## 2014-07-21 DIAGNOSIS — K7581 Nonalcoholic steatohepatitis (NASH): Secondary | ICD-10-CM

## 2014-07-21 DIAGNOSIS — R001 Bradycardia, unspecified: Secondary | ICD-10-CM | POA: Diagnosis not present

## 2014-07-21 DIAGNOSIS — R609 Edema, unspecified: Secondary | ICD-10-CM

## 2014-07-21 DIAGNOSIS — E785 Hyperlipidemia, unspecified: Secondary | ICD-10-CM | POA: Diagnosis not present

## 2014-07-21 DIAGNOSIS — Z5309 Procedure and treatment not carried out because of other contraindication: Secondary | ICD-10-CM | POA: Insufficient documentation

## 2014-07-21 DIAGNOSIS — Z6837 Body mass index (BMI) 37.0-37.9, adult: Secondary | ICD-10-CM | POA: Diagnosis not present

## 2014-07-21 DIAGNOSIS — M51379 Other intervertebral disc degeneration, lumbosacral region without mention of lumbar back pain or lower extremity pain: Secondary | ICD-10-CM

## 2014-07-21 DIAGNOSIS — M5137 Other intervertebral disc degeneration, lumbosacral region: Secondary | ICD-10-CM

## 2014-07-21 DIAGNOSIS — I455 Other specified heart block: Secondary | ICD-10-CM

## 2014-07-21 DIAGNOSIS — Z9889 Other specified postprocedural states: Secondary | ICD-10-CM

## 2014-07-21 HISTORY — DX: Type 2 diabetes mellitus without complications: E11.9

## 2014-07-21 HISTORY — PX: BIOPSY: SHX5522

## 2014-07-21 HISTORY — PX: COLONOSCOPY WITH PROPOFOL: SHX5780

## 2014-07-21 HISTORY — PX: ESOPHAGOGASTRODUODENOSCOPY (EGD) WITH PROPOFOL: SHX5813

## 2014-07-21 HISTORY — DX: Iron deficiency anemia, unspecified: D50.9

## 2014-07-21 HISTORY — DX: Essential (primary) hypertension: I10

## 2014-07-21 LAB — COMPREHENSIVE METABOLIC PANEL
ALT: 70 U/L — ABNORMAL HIGH (ref 0–53)
AST: 68 U/L — AB (ref 0–37)
Albumin: 3.9 g/dL (ref 3.5–5.2)
Alkaline Phosphatase: 73 U/L (ref 39–117)
Anion gap: 10 (ref 5–15)
BUN: 7 mg/dL (ref 6–23)
CO2: 29 mEq/L (ref 19–32)
Calcium: 9.7 mg/dL (ref 8.4–10.5)
Chloride: 97 mEq/L (ref 96–112)
Creatinine, Ser: 0.78 mg/dL (ref 0.50–1.35)
GFR calc Af Amer: >90 mL/min (ref >90)
GFR calc non Af Amer: >90 mL/min (ref >90)
Glucose, Bld: 226 mg/dL — ABNORMAL HIGH (ref 70–99)
Potassium: 4.6 mEq/L (ref 3.7–5.3)
Sodium: 136 mEq/L — ABNORMAL LOW (ref 137–147)
TOTAL PROTEIN: 7 g/dL (ref 6.0–8.3)
Total Bilirubin: 0.2 mg/dL — ABNORMAL LOW (ref 0.3–1.2)

## 2014-07-21 LAB — CBC
HCT: 33.8 % — ABNORMAL LOW (ref 39.0–52.0)
Hemoglobin: 10.5 g/dL — ABNORMAL LOW (ref 13.0–17.0)
MCH: 21.9 pg — AB (ref 26.0–34.0)
MCHC: 31.1 g/dL (ref 30.0–36.0)
MCV: 70.4 fL — AB (ref 78.0–100.0)
PLATELETS: 247 10*3/uL (ref 150–400)
RBC: 4.8 MIL/uL (ref 4.22–5.81)
RDW: 18.3 % — ABNORMAL HIGH (ref 11.5–15.5)
WBC: 3.8 10*3/uL — ABNORMAL LOW (ref 4.0–10.5)

## 2014-07-21 LAB — TROPONIN I
Troponin I: <0.3 ng/mL (ref <0.30)
Troponin I: <0.3 ng/mL (ref <0.30)

## 2014-07-21 LAB — GLUCOSE, CAPILLARY
GLUCOSE-CAPILLARY: 131 mg/dL — AB (ref 70–99)
Glucose-Capillary: 180 mg/dL — ABNORMAL HIGH (ref 70–99)
Glucose-Capillary: 131 mg/dL — ABNORMAL HIGH (ref 70–99)

## 2014-07-21 LAB — MAGNESIUM: Magnesium: 1.8 mg/dL (ref 1.5–2.5)

## 2014-07-21 LAB — MRSA PCR SCREENING: MRSA BY PCR: NEGATIVE

## 2014-07-21 SURGERY — COLONOSCOPY WITH PROPOFOL
Anesthesia: Monitor Anesthesia Care

## 2014-07-21 SURGERY — COLONOSCOPY
Anesthesia: Monitor Anesthesia Care

## 2014-07-21 MED ORDER — INSULIN ASPART 100 UNIT/ML ~~LOC~~ SOLN
0.0000 [IU] | Freq: Three times a day (TID) | SUBCUTANEOUS | Status: DC
  Administered 2014-07-21: 3 [IU] via SUBCUTANEOUS
  Administered 2014-07-22 (×2): 2 [IU] via SUBCUTANEOUS

## 2014-07-21 MED ORDER — PROPOFOL INFUSION 10 MG/ML OPTIME
INTRAVENOUS | Status: DC | PRN
  Administered 2014-07-21: 100 ug/kg/min via INTRAVENOUS

## 2014-07-21 MED ORDER — FERROUS SULFATE 325 (65 FE) MG PO TABS
325.0000 mg | ORAL_TABLET | Freq: Every day | ORAL | Status: DC
Start: 2014-07-21 — End: 2014-07-22
  Administered 2014-07-21 – 2014-07-22 (×2): 325 mg via ORAL
  Filled 2014-07-21 (×2): qty 1

## 2014-07-21 MED ORDER — BISACODYL 10 MG RE SUPP: 10.0000 mg | Freq: Every day | RECTAL | Status: DC | PRN

## 2014-07-21 MED ORDER — POTASSIUM CHLORIDE CRYS ER 10 MEQ PO TBCR: 10.0000 meq | EXTENDED_RELEASE_TABLET | Freq: Every day | ORAL | Status: DC | PRN

## 2014-07-21 MED ORDER — ALUM & MAG HYDROXIDE-SIMETH 200-200-20 MG/5ML PO SUSP: 30.0000 mL | Freq: Four times a day (QID) | ORAL | Status: DC | PRN

## 2014-07-21 MED ORDER — GABAPENTIN 400 MG PO CAPS
400.0000 mg | ORAL_CAPSULE | Freq: Two times a day (BID) | ORAL | Status: DC
  Administered 2014-07-21 – 2014-07-22 (×3): 400 mg via ORAL
  Filled 2014-07-21 (×3): qty 1

## 2014-07-21 MED ORDER — STERILE WATER FOR IRRIGATION IR SOLN
Status: DC | PRN
  Administered 2014-07-21: 11:00:00

## 2014-07-21 MED ORDER — MORPHINE SULFATE 2 MG/ML IJ SOLN: 1.0000 mg | INTRAMUSCULAR | Status: DC | PRN

## 2014-07-21 MED ORDER — METFORMIN HCL 500 MG PO TABS
1000.0000 mg | ORAL_TABLET | Freq: Two times a day (BID) | ORAL | Status: DC
  Administered 2014-07-21 – 2014-07-22 (×2): 1000 mg via ORAL
  Filled 2014-07-21 (×2): qty 2

## 2014-07-21 MED ORDER — ONDANSETRON HCL 4 MG/2ML IJ SOLN: 4.0000 mg | Freq: Four times a day (QID) | INTRAMUSCULAR | Status: DC | PRN

## 2014-07-21 MED ORDER — FENTANYL CITRATE 0.05 MG/ML IJ SOLN: 25.0000 ug | INTRAMUSCULAR | Status: DC | PRN

## 2014-07-21 MED ORDER — ONDANSETRON HCL 4 MG/2ML IJ SOLN
4.0000 mg | Freq: Once | INTRAMUSCULAR | Status: AC
  Administered 2014-07-21: 4 mg via INTRAVENOUS

## 2014-07-21 MED ORDER — ENOXAPARIN SODIUM 40 MG/0.4ML ~~LOC~~ SOLN
40.0000 mg | SUBCUTANEOUS | Status: DC
  Administered 2014-07-21: 40 mg via SUBCUTANEOUS
  Filled 2014-07-21: qty 0.4

## 2014-07-21 MED ORDER — LIDOCAINE VISCOUS 2 % MT SOLN
7.5000 mL | Freq: Once | OROMUCOSAL | Status: AC
  Administered 2014-07-21: 7.5 mL via OROMUCOSAL
  Filled 2014-07-21: qty 10

## 2014-07-21 MED ORDER — HYDROCHLOROTHIAZIDE 25 MG PO TABS
25.0000 mg | ORAL_TABLET | Freq: Every day | ORAL | Status: DC
  Administered 2014-07-21 – 2014-07-22 (×2): 25 mg via ORAL
  Filled 2014-07-21 (×2): qty 1

## 2014-07-21 MED ORDER — GLYCOPYRROLATE 0.2 MG/ML IJ SOLN
INTRAMUSCULAR | Status: DC | PRN
  Administered 2014-07-21: 0.2 mg via INTRAVENOUS

## 2014-07-21 MED ORDER — PROPOFOL 10 MG/ML IV EMUL
INTRAVENOUS | Status: AC
Start: 2014-07-21 — End: 2014-07-21
  Filled 2014-07-21: qty 20

## 2014-07-21 MED ORDER — FENTANYL CITRATE 0.05 MG/ML IJ SOLN
INTRAMUSCULAR | Status: AC
  Filled 2014-07-21: qty 2

## 2014-07-21 MED ORDER — PRAVASTATIN SODIUM 40 MG PO TABS
40.0000 mg | ORAL_TABLET | Freq: Every day | ORAL | Status: DC
  Administered 2014-07-21 – 2014-07-22 (×2): 40 mg via ORAL
  Filled 2014-07-21 (×2): qty 1

## 2014-07-21 MED ORDER — LIDOCAINE VISCOUS 2 % MT SOLN
15.0000 mL | Freq: Once | OROMUCOSAL | Status: AC
  Administered 2014-07-21: 7.5 mL via OROMUCOSAL
  Filled 2014-07-21: qty 15

## 2014-07-21 MED ORDER — MAGNESIUM CITRATE PO SOLN: 1.0000 | Freq: Once | ORAL | Status: AC | PRN

## 2014-07-21 MED ORDER — SENNOSIDES-DOCUSATE SODIUM 8.6-50 MG PO TABS: 1.0000 | ORAL_TABLET | Freq: Every evening | ORAL | Status: DC | PRN

## 2014-07-21 MED ORDER — AMLODIPINE BESYLATE 5 MG PO TABS
10.0000 mg | ORAL_TABLET | Freq: Every day | ORAL | Status: DC
  Administered 2014-07-21 – 2014-07-22 (×2): 10 mg via ORAL
  Filled 2014-07-21 (×2): qty 2

## 2014-07-21 MED ORDER — HYDROCODONE-ACETAMINOPHEN 10-325 MG PO TABS
1.0000 | ORAL_TABLET | Freq: Four times a day (QID) | ORAL | Status: DC | PRN
  Administered 2014-07-21 – 2014-07-22 (×2): 1 via ORAL
  Filled 2014-07-21 (×2): qty 1

## 2014-07-21 MED ORDER — SODIUM CHLORIDE 0.9 % IJ SOLN
3.0000 mL | Freq: Two times a day (BID) | INTRAMUSCULAR | Status: DC
Start: 2014-07-21 — End: 2014-07-22
  Administered 2014-07-21 – 2014-07-22 (×3): 3 mL via INTRAVENOUS

## 2014-07-21 MED ORDER — LIDOCAINE VISCOUS 2 % MT SOLN
OROMUCOSAL | Status: AC
  Filled 2014-07-21: qty 15

## 2014-07-21 MED ORDER — SODIUM CHLORIDE 0.9 % IV SOLN: 250.0000 mL | INTRAVENOUS | Status: DC | PRN

## 2014-07-21 MED ORDER — SODIUM CHLORIDE 0.9 % IJ SOLN: 3.0000 mL | INTRAMUSCULAR | Status: DC | PRN

## 2014-07-21 MED ORDER — BENAZEPRIL HCL 10 MG PO TABS
40.0000 mg | ORAL_TABLET | Freq: Every day | ORAL | Status: DC
  Administered 2014-07-21 – 2014-07-22 (×2): 40 mg via ORAL
  Filled 2014-07-21 (×2): qty 4

## 2014-07-21 MED ORDER — INSULIN ASPART 100 UNIT/ML ~~LOC~~ SOLN: 0.0000 [IU] | Freq: Every day | SUBCUTANEOUS | Status: DC

## 2014-07-21 MED ORDER — ATROPINE SULFATE 1 MG/ML IJ SOLN
INTRAMUSCULAR | Status: DC | PRN
  Administered 2014-07-21 (×2): .5 mg via INTRAVENOUS

## 2014-07-21 MED ORDER — PANTOPRAZOLE SODIUM 40 MG PO TBEC
40.0000 mg | DELAYED_RELEASE_TABLET | Freq: Every day | ORAL | Status: DC
  Administered 2014-07-21 – 2014-07-22 (×2): 40 mg via ORAL
  Filled 2014-07-21 (×2): qty 1

## 2014-07-21 MED ORDER — LACTATED RINGERS IV SOLN
INTRAVENOUS | Status: DC
  Administered 2014-07-21: 1000 mL via INTRAVENOUS
  Administered 2014-07-21: 10:00:00 via INTRAVENOUS

## 2014-07-21 MED ORDER — MIDAZOLAM HCL 2 MG/2ML IJ SOLN
1.0000 mg | INTRAMUSCULAR | Status: DC | PRN
  Administered 2014-07-21: 2 mg via INTRAVENOUS

## 2014-07-21 MED ORDER — ASPIRIN EC 81 MG PO TBEC
81.0000 mg | DELAYED_RELEASE_TABLET | Freq: Every day | ORAL | Status: DC
  Administered 2014-07-21 – 2014-07-22 (×2): 81 mg via ORAL
  Filled 2014-07-21 (×2): qty 1

## 2014-07-21 MED ORDER — GLIPIZIDE 5 MG PO TABS
10.0000 mg | ORAL_TABLET | Freq: Two times a day (BID) | ORAL | Status: DC
  Administered 2014-07-21 – 2014-07-22 (×2): 10 mg via ORAL
  Filled 2014-07-21 (×2): qty 2

## 2014-07-21 MED ORDER — LIDOCAINE HCL (PF) 1 % IJ SOLN
INTRAMUSCULAR | Status: AC
  Filled 2014-07-21: qty 5

## 2014-07-21 MED ORDER — WATER FOR IRRIGATION, STERILE IR SOLN
Status: DC | PRN
  Administered 2014-07-21: 1000 mL

## 2014-07-21 MED ORDER — LIDOCAINE HCL (CARDIAC) 20 MG/ML IV SOLN
INTRAVENOUS | Status: DC | PRN
  Administered 2014-07-21: 50 mg via INTRAVENOUS

## 2014-07-21 MED ORDER — DICLOFENAC SODIUM 1 % TD GEL
1.0000 "application " | Freq: Four times a day (QID) | TRANSDERMAL | Status: DC
  Administered 2014-07-21 – 2014-07-22 (×3): 1 via TOPICAL
  Filled 2014-07-21: qty 100

## 2014-07-21 MED ORDER — GLYCOPYRROLATE 0.2 MG/ML IJ SOLN
INTRAMUSCULAR | Status: AC
  Filled 2014-07-21: qty 1

## 2014-07-21 MED ORDER — ONDANSETRON HCL 4 MG/2ML IJ SOLN
INTRAMUSCULAR | Status: AC
  Filled 2014-07-21: qty 2

## 2014-07-21 MED ORDER — TRAZODONE HCL 50 MG PO TABS: 25.0000 mg | ORAL_TABLET | Freq: Every evening | ORAL | Status: DC | PRN

## 2014-07-21 MED ORDER — FENTANYL CITRATE 0.05 MG/ML IJ SOLN
25.0000 ug | INTRAMUSCULAR | Status: DC
  Administered 2014-07-21: 25 ug via INTRAVENOUS
  Filled 2014-07-21: qty 2

## 2014-07-21 MED ORDER — MELOXICAM 7.5 MG PO TABS
15.0000 mg | ORAL_TABLET | Freq: Every day | ORAL | Status: DC
  Administered 2014-07-21 – 2014-07-22 (×2): 15 mg via ORAL
  Filled 2014-07-21 (×5): qty 2

## 2014-07-21 MED ORDER — ONDANSETRON HCL 4 MG PO TABS: 4.0000 mg | ORAL_TABLET | Freq: Four times a day (QID) | ORAL | Status: DC | PRN

## 2014-07-21 MED ORDER — MIDAZOLAM HCL 2 MG/2ML IJ SOLN
INTRAMUSCULAR | Status: AC
  Filled 2014-07-21: qty 2

## 2014-07-21 MED ORDER — ONDANSETRON HCL 4 MG/2ML IJ SOLN: 4.0000 mg | Freq: Once | INTRAMUSCULAR | Status: DC | PRN

## 2014-07-21 SURGICAL SUPPLY — 27 items
BLOCK BITE 60FR ADLT L/F BLUE (MISCELLANEOUS) ×2 IMPLANT
DEVICE CLIP HEMOSTAT 235CM (CLIP) IMPLANT
ELECT REM PT RETURN 9FT ADLT (ELECTROSURGICAL)
ELECTRODE REM PT RTRN 9FT ADLT (ELECTROSURGICAL) IMPLANT
FCP BXJMBJMB 240X2.8X (CUTTING FORCEPS)
FLOOR PAD 36X40 (MISCELLANEOUS) ×3
FORCEPS BIOP RAD 4 LRG CAP 4 (CUTTING FORCEPS) ×2 IMPLANT
FORCEPS BIOP RJ4 240 W/NDL (CUTTING FORCEPS)
FORCEPS BXJMBJMB 240X2.8X (CUTTING FORCEPS) IMPLANT
FORMALIN 10 PREFIL 20ML (MISCELLANEOUS) ×4 IMPLANT
INJECTOR/SNARE I SNARE (MISCELLANEOUS) IMPLANT
KIT CLEAN ENDO COMPLIANCE (KITS) ×3 IMPLANT
LUBRICANT JELLY 4.5OZ STERILE (MISCELLANEOUS) ×2 IMPLANT
MANIFOLD NEPTUNE II (INSTRUMENTS) ×2 IMPLANT
NDL SCLEROTHERAPY 25GX240 (NEEDLE) IMPLANT
NEEDLE SCLEROTHERAPY 25GX240 (NEEDLE) IMPLANT
PAD FLOOR 36X40 (MISCELLANEOUS) IMPLANT
PROBE APC STR FIRE (PROBE) IMPLANT
PROBE INJECTION GOLD (MISCELLANEOUS)
PROBE INJECTION GOLD 7FR (MISCELLANEOUS) IMPLANT
SNARE ROTATE MED OVAL 20MM (MISCELLANEOUS) IMPLANT
SNARE SHORT THROW 13M SML OVAL (MISCELLANEOUS) ×3 IMPLANT
SYR 50ML LL SCALE MARK (SYRINGE) IMPLANT
SYR INFLATION 60ML (SYRINGE) ×3 IMPLANT
TRAP SPECIMEN MUCOUS 40CC (MISCELLANEOUS) IMPLANT
TUBING IRRIGATION ENDOGATOR (MISCELLANEOUS) ×2 IMPLANT
WATER STERILE IRR 1000ML POUR (IV SOLUTION) ×2 IMPLANT

## 2014-07-21 NOTE — Op Note (Signed)
[facility] [address] [place], [id number]   ENDOSCOPY PROCEDURE REPORT  PATIENT: Michael [name], Michael [name]  MR#: [id number] BIRTHDATE: December 06, 1945 , 57  yrs. old GENDER: male ENDOSCOPIST: R.  Garfield Cornea, MD FACP FACG REFERRED BY:  Vic Blackbird, M.D. PROCEDURE DATE:  08/08/14 PROCEDURE:  EGD w/ biopsy INDICATIONS:  iron deficiency anemia. MEDICATIONS: deep sedation per Dr.  [facility] ASA CLASS:      Class II  CONSENT: The risks, benefits, limitations, alternatives and imponderables have been discussed.  The potential for biopsy, esophogeal dilation, etc. have also been reviewed.  Questions have been answered.  All parties agreeable.  Please see the history and physical in the medical record for more information.  DESCRIPTION OF PROCEDURE: After the risks benefits and alternatives of the procedure were thoroughly explained, informed consent was obtained.  The    endoscope was introduced through the mouth and advanced to the second portion of the duodenum , limited by Without limitations. The instrument was slowly withdrawn as the mucosa was fully examined.    Normal esophagus.  Stomach empty.  fibrotic-appearing mucosa with overlying erosions in the antrum extending down the pyloric channel.  Single erosion in the pyloric channel itself easily bled with gentle scope manipulation.  No obvious infiltrating process or Frank peptic ulcer.  Pylorus patent and easily traversed. Examination of bulb and second portion revealed no abnormalities. Retroflexed views revealed no abnormalities and Retroflexed views revealed as previously described.  Biopsies of the abnormal antrum and biopsies of normal-appearing duodenal mucosa taken for histologic study   The scope was then withdrawn from the patient and the procedure completed.  COMPLICATIONS: There were no immediate complications.  ENDOSCOPIC IMPRESSION: Inflammatory changes involving the gastric  mucosa most likely NSAID effect. I suspect NSAID effect has much to do with his iron deficiency anemia -  status post gastric and duodenal biopsy (latter to screen for celiac disease)  RECOMMENDATIONS: Followup on pathology. See colonoscopy report.  REPEAT EXAM:  eSigned:  R. Garfield Cornea, MD Rosalita Chessman [facility] 08-08-2014 10:58 AM    CC:  CPT CODES: ICD CODES:  The ICD and CPT codes recommended by this software are interpretations from the data that the clinical staff has captured with the software.  The verification of the translation of this report to the ICD and CPT codes and modifiers is the sole responsibility of the health care institution and practicing physician where this report was generated.  [employer] will not be held responsible for the validity of the ICD and CPT codes included on this report.  AMA assumes no liability for data contained or not contained herein. CPT is a Designer, television/film set of the [employer].  PATIENT NAME:  [name], [name] MR#: [id number]

## 2014-07-21 NOTE — Plan of Care (Signed)
Bases on Pt HR issues in OR (during current procedures) and orders for pt to go to stepdown bed, pt is being transferred to ICU. Pt and family notified of change and report called to ICU, RN.

## 2014-07-21 NOTE — H&P (View-Only) (Signed)
Referring Provider: [name], MD Primary Care Physician:  Vic Blackbird, MD Primary GI: Dr. Gala Romney   Chief Complaint  Patient presents with  . Follow-up    update H & P    HPI:   Michael [name] presents today for an H&P prior to colonoscopy/EGD due to IDA. He has a history of IDA with last GI evaluation in 2011 at the [facility]. At that time, EGD with enteroscopy showed normal esophagus, stomach, normal duodenum and jejunum. No evidence of AVMs. Colonoscopy normal with normal TI. Recent ferritin 7, iron 20. Hgb 9. [place] with elastography performed in September with Metavir score of 4. Needs viral markers. LFTs normal. Likely dealing with NASH.  States he woke up during the procedures in [place]. Needs Propofol. Some low-volume hematochezia. No melena. 3-4 soft bowel movements a day. On Meloxicam and Naproxen once a day. Prilosec BID. On iron once daily. Feels fatigued. Lower leg pain, knee pain. Receiving injections in knees.   Past Medical History  Diagnosis Date  . COPD (chronic obstructive pulmonary disease)   . Diabetes mellitus   . Hyperlipidemia   . Hypertension   . Anemia   . NASH (nonalcoholic steatohepatitis)   . Diabetic neuropathy   . GERD (gastroesophageal reflux disease)   . Arthritis     Past Surgical History  Procedure Laterality Date  . Hernia repair    . Shoulder surgery      right  . Lipoma removal      stomach  . Knee arthroscopy with medial menisectomy Left 11/06/2012    Procedure: KNEE ARTHROSCOPY WITH MEDIAL MENISECTOMY;  Surgeon: Carole Civil, MD;  Location: [facility];  Service: Orthopedics;  Laterality: Left;  . Lumbar laminectomy/decompression microdiscectomy Left 08/30/2013    Procedure: LUMBAR LAMINECTOMY/DECOMPRESSION MICRODISCECTOMY LEFT  LUMBAR TWO THREE;  Surgeon: Otilio Connors, MD;  Location: [place] [facility];  Service: Neurosurgery;  Laterality: Left;  . Egd with enteroscopy  2011    [facility]: normal esophagus and stomach. Normal  duodenum, jejunum. No evidence of AVMs.   . Colonoscopy  2011    [facility]: normal colon, normal distal ileum    Current Outpatient Prescriptions  Medication Sig Dispense Refill  . amLODipine (NORVASC) 10 MG tablet TAKE 1 TABLET (10 MG TOTAL) BY MOUTH DAILY.  90 tablet  1  . aspirin EC 81 MG tablet Take 81 mg by mouth daily.      . benazepril (LOTENSIN) 40 MG tablet Take 1 tablet (40 mg total) by mouth daily.  30 tablet  3  . clobetasol cream (TEMOVATE) 0.93 % Apply 1 application topically 2 (two) times daily. Apply to scalp BID for dermatitis  30 g  3  . diclofenac sodium (VOLTAREN) 1 % GEL Apply 1 application topically 4 (four) times daily.  5 Tube  3  . Ferrous Sulfate (IRON) 325 (65 FE) MG TABS Take by mouth.      . gabapentin (NEURONTIN) 400 MG capsule TAKE 1 CAPSULE (400 MG TOTAL) BY MOUTH 2 TIMES DAILY.      Marland Kitchen glipiZIDE (GLUCOTROL) 10 MG tablet TAKE 1 TABLET BY MOUTH TWICE A DAY BEFORE MEALS  60 tablet  3  . hydrochlorothiazide (HYDRODIURIL) 25 MG tablet TAKE 1 TABLET BY MOUTH EVERY DAY  30 tablet  3  . HYDROcodone-acetaminophen (NORCO) 10-325 MG per tablet Take 1 tablet by mouth every 8 (eight) hours as needed.  90 tablet  0  . insulin aspart protamine- aspart (  NOVOLOG MIX 70/30) (70-30) 100 UNIT/ML injection INJECT 20- 30 units INTO THE SKIN 2 (TWO) TIMES DAILY WITH A MEAL.  20 mL  6  . meloxicam (MOBIC) 15 MG tablet Take 15 mg by mouth daily.       . metFORMIN (GLUCOPHAGE) 1000 MG tablet Take 1 tablet (1,000 mg total) by mouth 2 (two) times daily with a meal.  60 tablet  6  . naproxen (NAPROSYN) 375 MG tablet TAKE 1 TABLET BY MOUTH TWICE A DAY WITH FOOD  60 tablet  6  . omeprazole (PRILOSEC) 20 MG capsule Take 1 capsule (20 mg total) by mouth 2 (two) times daily.  60 capsule  6  . potassium chloride (K-DUR,KLOR-CON) 10 MEQ tablet Take 10 mEq by mouth daily as needed (when taking torsemide).      . pravastatin (PRAVACHOL) 40 MG tablet TAKE 1 TABLET BY MOUTH EVERY DAY  30 tablet  3  .  torsemide (DEMADEX) 20 MG tablet Take 20 mg by mouth daily as needed (swelling).      . B-D INS SYRINGE 0.5CC/31GX5/16 31G X 5/16" 0.5 ML MISC 23 UNITS BY DOES NOT APPLY ROUTE 2 (TWO) TIMES DAILY.  100 each  5   No current facility-administered medications for this visit.    Allergies as of 06/29/2014  . (No Known Allergies)    Family History  Problem Relation Age of Onset  . Heart disease Mother   . Hyperlipidemia Mother   . Hypertension Mother   . Depression Mother   . Diabetes Mother   . Heart disease Father   . Hypertension Father   . Hyperlipidemia Father   . Diabetes Father   . Heart disease Sister   . Hyperlipidemia Sister   . Hypertension Sister   . Diabetes Sister   . Diabetes Brother   . Heart disease Sister   . Hyperlipidemia Sister   . Hypertension Sister   . Heart disease Sister   . Hyperlipidemia Sister   . Hypertension Sister   . Diabetes Sister   . Diabetes Brother   . Rectal cancer Mother     History   Social History  . Marital Status: Widowed    Spouse Name: N/A    Number of Children: N/A  . Years of Education: 47   Social History Main Topics  . Smoking status: Former Smoker -- 2.50 packs/day for 44 years  . Smokeless tobacco: Former Systems developer    Quit date: 09/30/2010  . Alcohol Use: No  . Drug Use: No  . Sexual Activity: None   Other Topics Concern  . None   Social History Narrative  . None    Review of Systems: As mentioned in HPI  Physical Exam: BP 142/80  Pulse 76  Temp(Src) 98.7 F (37.1 C) (Oral)  Ht 5' 2"  (1.575 m)  Wt 212 lb (96.163 kg)  BMI 38.77 kg/m2 General:   Alert and oriented. No distress noted. Pleasant and cooperative.  Head:  Normocephalic and atraumatic. Eyes:  Conjuctiva clear without scleral icterus. Mouth:  Oral mucosa pink and moist.  Heart:  S1, S2 present without murmurs, rubs, or gallops. Regular rate and rhythm. Abdomen:  +BS, soft, non-tender and non-distended. No rebound or guarding. Hepatomegaly.    Msk:  Stooped over, shuffling gait Extremities:  1+ edema Neurologic:  Alert and  oriented x4;  grossly normal neurologically. Skin:  Intact without significant lesions or rashes. Psych:  Alert and cooperative. Normal mood and affect.  Lab Results  Component  Value Date   WBC 7.1 05/11/2014   HGB 9.1* 05/11/2014   HCT 31.4* 05/11/2014   MCV 67.4* 05/11/2014   PLT 345 05/11/2014   Lab Results  Component Value Date   IRON 20* 06/07/2014   TIBC 410 04/13/2013   FERRITIN 7* 06/07/2014   Lab Results  Component Value Date   ALT 44 04/18/2014   AST 30 04/18/2014   ALKPHOS 57 04/18/2014   BILITOT 0.4 04/18/2014

## 2014-07-21 NOTE — Anesthesia Preprocedure Evaluation (Signed)
Anesthesia Evaluation  Patient identified by MRN, date of birth, ID band Patient awake    Reviewed: Allergy & Precautions, H&P , NPO status , Patient's Chart, lab work & pertinent test results  Airway Mallampati: II TM Distance: >3 FB     Dental  (+) Teeth Intact, Poor Dentition,    Pulmonary COPDformer smoker,  breath sounds clear to auscultation        Cardiovascular hypertension, Pt. on medications Rhythm:Regular Rate:Normal     Neuro/Psych    GI/Hepatic GERD-  Medicated and Controlled,(+) Hepatitis - (NASH)  Endo/Other  diabetes, Type 2, Oral Hypoglycemic Agents  Renal/GU      Musculoskeletal   Abdominal   Peds  Hematology  (+) anemia ,   Anesthesia Other Findings   Reproductive/Obstetrics                           Anesthesia Physical Anesthesia Plan  ASA: III  Anesthesia Plan: MAC   Post-op Pain Management:    Induction: Intravenous  Airway Management Planned: Simple Face Mask  Additional Equipment:   Intra-op Plan:   Post-operative Plan:   Informed Consent: I have reviewed the patients History and Physical, chart, labs and discussed the procedure including the risks, benefits and alternatives for the proposed anesthesia with the patient or authorized representative who has indicated his/her understanding and acceptance.     Plan Discussed with:   Anesthesia Plan Comments:         Anesthesia Quick Evaluation

## 2014-07-21 NOTE — Consult Note (Signed)
Consulting cardiologist: Dr. Satira Sark  Clinical Summary Michael [name] is a 68 y.o.male with past medical history outlined below, referred today for upper and lower endoscopy with Dr. Gala Romney for further evaluation of iron deficiency anemia. Sedation was managed by the Anesthesia service. Patient initially treated with Versed and fentanyl, underwent EGD without event. Also treated with propofol for colonoscopy. Procedure had to be interrupted due to development of severe bradycardia and sinus pause, heart rate down in the 20s for a period of time. Patient was hemodynamically stable and did not require CPR. He was treated with Robinul, had withdrawal of the scope, and heart rate did increase after this. Also received of atropine.  I was asked by Dr. Gala Romney to evaluate the patient in the PACU. On my interview, patient's heart rate was in the 90-100 range in sinus rhythm and he was awake, not complaining of any chest pain or breathlessness. Still with oxygen on for general supportive measures. He denies any history of known heart disease, specifically no previous myocardial infarction, no arrhythmias, no unexplained syncope.   ECG done in the PACU showed sinus tachycardia at 102 beats per minute with poor anterior R wave progression, rule out old anterior infarct pattern. Prior tracings reviewed with similar findings.  Echocardiogram from August 2014 reported mild LVH with LVEF 98-92%, grade 1 diastolic dysfunction with increased filling pressures, mild left atrial enlargement, unable to assess PASP.  No Known Allergies  Medications No current facility-administered medications on file prior to encounter.   Current Outpatient Prescriptions on File Prior to Encounter  Medication Sig Dispense Refill  . aspirin EC 81 MG tablet Take 81 mg by mouth daily.      . benazepril (LOTENSIN) 40 MG tablet Take 1 tablet (40 mg total) by mouth daily.  30 tablet  3  . diclofenac sodium (VOLTAREN) 1 % GEL  Apply 1 application topically 4 (four) times daily.  5 Tube  3  . Ferrous Sulfate (IRON) 325 (65 FE) MG TABS Take 1 tablet by mouth daily.       . meloxicam (MOBIC) 15 MG tablet Take 15 mg by mouth daily.       . metFORMIN (GLUCOPHAGE) 1000 MG tablet Take 1 tablet (1,000 mg total) by mouth 2 (two) times daily with a meal.  60 tablet  6  . omeprazole (PRILOSEC) 20 MG capsule Take 1 capsule (20 mg total) by mouth 2 (two) times daily.  60 capsule  6  . polyethylene glycol-electrolytes (TRILYTE) 420 G solution Take 4,000 mLs by mouth as directed.  4000 mL  0  . potassium chloride (K-DUR,KLOR-CON) 10 MEQ tablet Take 10 mEq by mouth daily as needed (when taking torsemide).      . torsemide (DEMADEX) 20 MG tablet Take 20 mg by mouth daily as needed (swelling).        Past Medical History  Diagnosis Date  . COPD (chronic obstructive pulmonary disease)   . Type 2 diabetes mellitus   . Hyperlipidemia   . Essential hypertension   . Iron deficiency anemia   . NASH (nonalcoholic steatohepatitis)   . Diabetic neuropathy   . GERD (gastroesophageal reflux disease)   . Arthritis   . Depression     Past Surgical History  Procedure Laterality Date  . Hernia repair    . Shoulder surgery      Rght-rotator cuff  . Lipoma removal      Stomach  . Knee arthroscopy with medial menisectomy Left 11/06/2012  Procedure: KNEE ARTHROSCOPY WITH MEDIAL MENISECTOMY;  Surgeon: Carole Civil, MD;  Location: [facility];  Service: Orthopedics;  Laterality: Left;  . Lumbar laminectomy/decompression microdiscectomy Left 08/30/2013    Procedure: LUMBAR LAMINECTOMY/DECOMPRESSION MICRODISCECTOMY LEFT  LUMBAR TWO THREE;  Surgeon: Otilio Connors, MD;  Location: [facility];  Service: Neurosurgery;  Laterality: Left;  . Egd with enteroscopy  2011    [facility]: normal esophagus and stomach. Normal duodenum, jejunum. No evidence of AVMs.   . Colonoscopy  2011    [facility]: normal colon, normal distal ileum    Family  History  Problem Relation Age of Onset  . Heart disease Mother   . Hyperlipidemia Mother   . Hypertension Mother   . Depression Mother   . Diabetes Mother   . Heart disease Father   . Hypertension Father   . Hyperlipidemia Father   . Diabetes Father   . Heart disease Sister   . Hyperlipidemia Sister   . Hypertension Sister   . Diabetes Sister   . Diabetes Brother   . Heart disease Sister   . Hyperlipidemia Sister   . Hypertension Sister   . Heart disease Sister   . Hyperlipidemia Sister   . Hypertension Sister   . Diabetes Sister   . Diabetes Brother   . Rectal cancer Mother     Social History Mr. Williard reports that he has quit smoking. His smoking use included Cigarettes. He has a 110 pack-year smoking history. He quit smokeless tobacco use about 14 years ago. Mr. Mcfarland reports that he does not drink alcohol.  Review of Systems Patient reports feeling tired recently. No exertional chest pain. No palpitations or syncope. Hemoglobin as low as 8.0 as of July, most recently up to 10.3 on iron. No fevers or chills. No orthopnea or PND. Other systems reviewed and negative.  Physical Examination Blood pressure 128/66, pulse 85, temperature 97.7 F (36.5 C), temperature source Oral, resp. rate 17, SpO2 100.00%.  Intake/Output Summary (Last 24 hours) at 07/21/14 1110 Last data filed at 07/21/14 1050  Gross per 24 hour  Intake    500 ml  Output      0 ml  Net    500 ml    Obese male, wearing supplemental oxygen, no distress. HEENT: Conjunctiva and lids normal, oropharynx clear. Neck: Supple, increased girth, no obvious elevated JVP or carotid bruits, no thyromegaly. Lungs: Clear to auscultation, nonlabored breathing at rest. Cardiac: Regular rate and rhythm, no S3 or significant systolic murmur, no pericardial rub. Abdomen: Soft, nontender, bowel sounds present. Extremities: No pitting edema, distal pulses 2+. Skin: Warm and dry. Musculoskeletal: No  kyphosis. Neuropsychiatric: Alert and oriented x3, affect grossly appropriate.   Lab Results  Basic Metabolic Panel:  Recent Labs Lab 07/15/14 1024  NA 132*  K 4.5  CL 92*  CO2 25  GLUCOSE 169*  BUN 12  CREATININE 0.72  CALCIUM 9.7    CBC:  Recent Labs Lab 07/15/14 1024  HGB 10.3*  HCT 33.5*    Impression  1. Status post episode of profound bradycardia and sinus pause, most likely attributable to medications and increased vagal tone during colonoscopy. Patient has no known history of conduction system disease or syncope. Baseline ECG shows normal intervals, poor R wave progression which is old compared to prior tracings.  2. Essential hypertension, on Norvasc, HCTZ, and Lotensin as an outpatient.  3. Fatigue with iron deficiency anemia, GI workup ongoing. EGD was completed, colonoscopy interrupted.  4. COPD.  5. Type 2 diabetes mellitus with diabetic neuropathy.   Recommendations  Discussed with Dr. Gala Romney and reviewed with the patient. Would recommend observation overnight on telemetry, mainly to exclude any additional episodes of significant bradycardia or pauses that are not in the setting of any obvious precipitants. At this point doubt significant conduction system disease. Continue current baseline medical regimen. Followup ECG in the a.m.  Satira Sark, M.D., F.A.C.C.

## 2014-07-21 NOTE — Plan of Care (Signed)
Problem: Food- and Nutrition-Related Knowledge Deficit (NB-1.1) Goal: Nutrition education Formal process to instruct or train a patient/client in a skill or to impart knowledge to help patients/clients voluntarily manage or modify food choices and eating behavior to maintain or improve health. Outcome: Adequate for Discharge  RD consulted for nutrition education regarding diabetes and weight loss.     Lab Results  Component Value Date    HGBA1C 7.1* 04/18/2014   RD provided "weight Loss Tips", "Plate Method" handout and Healthy Food Shopping Guide. Discussed different food groups and their effects on blood sugar, emphasizing carbohydrate-containing foods. Provided list of carbohydrates and recommended serving sizes of common foods.  Discussed importance of controlled and consistent carbohydrate intake throughout the day. Provided examples of ways to balance meals/snacks and encouraged intake of high-fiber, whole grain complex carbohydrates. Emphasized the importance of hydration with calorie-free beverages and limiting sugar-sweetened beverages. Encouraged pt to discuss physical activity options with physician. Teach back method used.  Expect good compliance  Body mass index is 37.84 kg/(m^2). Pt meets criteria for obesity class II based on current BMI.  Current diet order is CHO Modified pt is consuming approximately 50-75% of meals at this time. Labs and medications reviewed. No further nutrition interventions warranted at this time. RD contact information provided. If additional nutrition issues arise, please re-consult RD.  Colman Cater MS,RD,CSG,LDN Office: [phone] Pager: [phone]

## 2014-07-21 NOTE — H&P (Signed)
[facility] History and Physical  Michael [name] NGE:[id number] DOB: 1946/05/05 DOA: 07/21/2014  Referring physician: Gala Romney PCP: Vic Blackbird, MD   Chief Complaint: bradycardia  HPI: Michael [name] is a very pleasant 68 y.o. male with a past medical history that includes diabetes type 2, COPD, hypertension, iron deficiency anemia, NASH who while undergoing colonoscopy today developed severe bradycardia with a heart rate in the 20s for 2-3 minutes per GI note. Procedure was interrupted [facility] asked to admit.  Chart review indicates patient scheduled for upper and lower endoscopy with Dr. work for evaluation of ongoing iron deficiency anemia. Patient was given Versed and fentanyl underwent EGD without problem. He was given propofol for colonoscopy but developed severe bradycardia. He was treated with robinul and atropine and the procedure was terminated. His heart rate rebounded quickly. He was evaluated by Dr. Domenic Polite with cardiology while in the PACU. who opined severe bradycardia and sinus pauses likely related to medications and increased vagal tone during colonoscopy.  The time of my exam he is afebrile hemodynamically stable with a heart rate of 98. He denies any chest pain palpitations or history of shortness of breath.   Review of Systems:  10 point review of systems complete. Patient does indicate chronic bilateral knee and hip pain and occasional lower extremity edema, otherwise all systems are negative  Past Medical History  Diagnosis Date  . COPD (chronic obstructive pulmonary disease)   . Type 2 diabetes mellitus   . Hyperlipidemia   . Essential hypertension   . Iron deficiency anemia   . NASH (nonalcoholic steatohepatitis)   . Diabetic neuropathy   . GERD (gastroesophageal reflux disease)   . Arthritis   . Depression    Past Surgical History  Procedure Laterality Date  . Hernia repair    . Shoulder surgery      Rght-rotator cuff  . Lipoma  removal      Stomach  . Knee arthroscopy with medial menisectomy Left 11/06/2012    Procedure: KNEE ARTHROSCOPY WITH MEDIAL MENISECTOMY;  Surgeon: Carole Civil, MD;  Location: [facility];  Service: Orthopedics;  Laterality: Left;  . Lumbar laminectomy/decompression microdiscectomy Left 08/30/2013    Procedure: LUMBAR LAMINECTOMY/DECOMPRESSION MICRODISCECTOMY LEFT  LUMBAR TWO THREE;  Surgeon: Otilio Connors, MD;  Location: [place] [facility];  Service: Neurosurgery;  Laterality: Left;  . Egd with enteroscopy  2011    [facility]: normal esophagus and stomach. Normal duodenum, jejunum. No evidence of AVMs.   . Colonoscopy  2011    [facility]: normal colon, normal distal ileum   Social History:  reports that he has quit smoking. His smoking use included Cigarettes. He has a 110 pack-year smoking history. He quit smokeless tobacco use about 14 years ago. He reports that he does not drink alcohol or use illicit drugs.  No Known Allergies  Family History  Problem Relation Age of Onset  . Heart disease Mother   . Hyperlipidemia Mother   . Hypertension Mother   . Depression Mother   . Diabetes Mother   . Heart disease Father   . Hypertension Father   . Hyperlipidemia Father   . Diabetes Father   . Heart disease Sister   . Hyperlipidemia Sister   . Hypertension Sister   . Diabetes Sister   . Diabetes Brother   . Heart disease Sister   . Hyperlipidemia Sister   . Hypertension Sister   . Heart disease Sister   . Hyperlipidemia Sister   . Hypertension  Sister   . Diabetes Sister   . Diabetes Brother   . Rectal cancer Mother      Prior to Admission medications   Medication Sig Start Date End Date Taking? Authorizing Provider  amLODipine (NORVASC) 10 MG tablet Take 10 mg by mouth daily.   Yes Historical Provider, MD  aspirin EC 81 MG tablet Take 81 mg by mouth daily.   Yes Historical Provider, MD  B-D INS SYRINGE 0.5CC/31GX5/16 31G X 5/16" 0.5 ML MISC  07/07/14  Yes Historical Provider, MD    benazepril (LOTENSIN) 40 MG tablet Take 1 tablet (40 mg total) by mouth daily. 11/28/13  Yes [name], MD  diclofenac sodium (VOLTAREN) 1 % GEL Apply 1 application topically 4 (four) times daily. 07/01/12  Yes Carole Civil, MD  Ferrous Sulfate (IRON) 325 (65 FE) MG TABS Take 1 tablet by mouth daily.    Yes Historical Provider, MD  gabapentin (NEURONTIN) 400 MG capsule Take 400 mg by mouth 2 (two) times daily.   Yes Historical Provider, MD  glipiZIDE (GLUCOTROL) 10 MG tablet Take 10 mg by mouth 2 (two) times daily before a meal.   Yes Historical Provider, MD  hydrochlorothiazide (HYDRODIURIL) 25 MG tablet Take 25 mg by mouth daily.   Yes Historical Provider, MD  HYDROcodone-acetaminophen (NORCO) 10-325 MG per tablet Take 1 tablet by mouth every 6 (six) hours as needed for moderate pain. 07/13/14  Yes [name], MD  insulin aspart protamine- aspart (NOVOLOG MIX 70/30) (70-30) 100 UNIT/ML injection Inject 23 Units into the skin 2 (two) times daily with a meal.   Yes Historical Provider, MD  meloxicam (MOBIC) 15 MG tablet Take 15 mg by mouth daily.  04/05/14  Yes Historical Provider, MD  metFORMIN (GLUCOPHAGE) 1000 MG tablet Take 1 tablet (1,000 mg total) by mouth 2 (two) times daily with a meal. 01/03/14  Yes [name], MD  omeprazole (PRILOSEC) 20 MG capsule Take 1 capsule (20 mg total) by mouth 2 (two) times daily. 11/30/13  Yes [name], MD  polyethylene glycol-electrolytes (TRILYTE) 420 G solution Take 4,000 mLs by mouth as directed. 06/29/14  Yes Daneil Dolin, MD  potassium chloride (K-DUR,KLOR-CON) 10 MEQ tablet Take 10 mEq by mouth daily as needed (when taking torsemide).   Yes Historical Provider, MD  pravastatin (PRAVACHOL) 40 MG tablet Take 40 mg by mouth daily.   Yes Historical Provider, MD  torsemide (DEMADEX) 20 MG tablet Take 20 mg by mouth daily as needed (swelling).   Yes Historical Provider, MD   Physical Exam: Filed Vitals:   07/21/14 1005 07/21/14 1047  07/21/14 1115 07/21/14 1130  BP: 128/66 119/61 135/73 131/74  Pulse:   93 104  Temp:  97.7 F (36.5 C)    TempSrc:      Resp: 15 17 15 18   SpO2: 97% 100% 100% 100%    Wt Readings from Last 3 Encounters:  07/13/14 96.163 kg (212 lb)  06/29/14 96.163 kg (212 lb)  06/15/14 94.802 kg (209 lb)    General:  Appears calm and comfortable, obese Eyes: PERRL, normal lids, irises & conjunctiva ENT: grossly normal hearing, mucous membranes of his mouth pink but slightly dry Neck: no LAD, masses or thyromegaly Cardiovascular: RRR, no m/r/g. Trace lower extremity edema Respiratory: CTA bilaterally, no w/r/r. Normal respiratory effort. Abdomen: soft, ntnd, obese positive bowel sounds  Skin: no rash or induration seen on limited exam Musculoskeletal: grossly normal tone BUE/BLE Psychiatric: grossly normal mood and affect,  speech fluent and appropriate Neurologic: grossly non-focal. Speech clear facial symmetry           Labs on Admission:  Basic Metabolic Panel:  Recent Labs Lab 07/15/14 1024  NA 132*  K 4.5  CL 92*  CO2 25  GLUCOSE 169*  BUN 12  CREATININE 0.72  CALCIUM 9.7   Liver Function Tests: No results found for this basename: AST, ALT, ALKPHOS, BILITOT, PROT, ALBUMIN,  in the last 168 hours No results found for this basename: LIPASE, AMYLASE,  in the last 168 hours No results found for this basename: AMMONIA,  in the last 168 hours CBC:  Recent Labs Lab 07/15/14 1024  HGB 10.3*  HCT 33.5*   Cardiac Enzymes: No results found for this basename: CKTOTAL, CKMB, CKMBINDEX, TROPONINI,  in the last 168 hours  BNP (last 3 results) No results found for this basename: PROBNP,  in the last 8760 hours CBG:  Recent Labs Lab 07/21/14 [place]*    Radiological Exams on Admission: No results found.  EKG: Independently reviewed sinus tachycardia  Assessment/Plan Principal Problem:   Bradycardia/sinus pause:  Evaluated in the PACU by cardiology who opined  likely secondary to indications and increased vagal tone during colonoscopy. No history of chest pain palpitations shortness of breath syncope or near-syncope. EKG with sinus tach. Will admit for observation to the step down unit. Will repeat EKG in the morning. Apprecieate cardiology assistance  Active Problems:  Anemia: Chronic iron deficiency anemia. Being followed by Dr. Donzetta Kohut. Chart review indicates patient had GI evaluation in 2011 at [facility]. EGD done today reveals inflammatory changes involving the gastric mucosa most likely NSAID related. Colonoscopy interrupted as noted above. Get CBC to monitor hemoglobin. Followup outpatient    Type II diabetes mellitus with neurological manifestations: Chart review indicates hemoglobin A1c 72 months ago. I will repeat. I will continue his oral agents and use sliding scale insulin for optimal control. Provide with a heart healthy car modified diet    Hyperlipidemia: Obtain a lipid panel. Continue statin    Essential hypertension: Controlled in the PACU. I medications include Norvasc, and as a PERRL, hydrochlorothiazide, torsemide. Will continue these for now. Watch her closely.    GERD: Continue PPI    Arthritis. History of bilateral knee and hip pain. Reports his PCP recommended a walker. Will request physical therapy evaluation tomorrow may benefit from home health     Dr Domenic Polite Code Status: full DVT Prophylaxis: Family Communication: none present Disposition Plan: home hopefully in am  Time spent: 66 minutes  [facility] Pager [phone]

## 2014-07-21 NOTE — Transfer of Care (Signed)
Immediate Anesthesia Transfer of Care Note  Patient: Michael [name]  Procedure(s) Performed: Procedure(s): ATTEMPTED COLONOSCOPY WITH PROPOFOL-HAD TO STOP DUE TO BRADYCARDIA (N/A) ESOPHAGOGASTRODUODENOSCOPY (EGD) WITH PROPOFOL (N/A) BIOPSY (N/A)  Patient Location: PACU  Anesthesia Type:MAC  Level of Consciousness: awake, alert  and oriented  Airway & Oxygen Therapy: Patient Spontanous Breathing and Patient connected to face mask oxygen  Post-op Assessment: Report given to PACU RN and Post -op Vital signs reviewed and stable  Post vital signs: Reviewed and stable  Complications: No apparent anesthesia complications

## 2014-07-21 NOTE — Interval H&P Note (Signed)
History and Physical Interval Note:  07/21/2014 9:10 AM  Michael [name]  has presented today for surgery, with the diagnosis of ida  The various methods of treatment have been discussed with the patient and family. After consideration of risks, benefits and other options for treatment, the patient has consented to  Procedure(s) with comments: COLONOSCOPY WITH PROPOFOL (N/A) - 945 ESOPHAGOGASTRODUODENOSCOPY (EGD) WITH PROPOFOL (N/A) as a surgical intervention .  The patient's history has been reviewed, patient examined, no change in status, stable for surgery.  I have reviewed the patient's chart and labs.  Questions were answered to the patient's satisfaction.     Michael [name]  No change. EGD and colonoscopy per plan.The risks, benefits, limitations, imponderables and alternatives regarding both EGD and colonoscopy have been reviewed with the patient. Questions have been answered. All parties agreeable.

## 2014-07-21 NOTE — Anesthesia Postprocedure Evaluation (Signed)
  Anesthesia Post-op Note  Patient: Michael [name]  Procedure(s) Performed: Procedure(s): ATTEMPTED COLONOSCOPY WITH PROPOFOL-HAD TO STOP DUE TO BRADYCARDIA (N/A) ESOPHAGOGASTRODUODENOSCOPY (EGD) WITH PROPOFOL (N/A) BIOPSY (N/A)  Patient Location: PACU  Anesthesia Type:MAC  Level of Consciousness: awake, alert  and oriented  Airway and Oxygen Therapy: Patient Spontanous Breathing  Post-op Pain: none  Post-op Assessment: Post-op Vital signs reviewed, Patient's Cardiovascular Status Stable, Respiratory Function Stable, Patent Airway and No signs of Nausea or vomiting  Post-op Vital Signs: Reviewed and stable  Last Vitals:  Filed Vitals:   07/21/14 1344  BP: 129/65  Pulse: 88  Temp: 36.7 C  Resp: 25    Complications: No apparent anesthesia complications  Late entry, patient seen before discharge.

## 2014-07-21 NOTE — Op Note (Addendum)
[facility] [address] [place], [address]   COLONOSCOPY PROCEDURE REPORT  PATIENT: Michael [name], Michael [name]  MR#: [id number] BIRTHDATE: [birth year] , 65  yrs. old GENDER: male ENDOSCOPIST: R.  Garfield Cornea, MD FACP [facility] REFERRED TK:[id number] , M.D. PROCEDURE DATE:  08-08-14 PROCEDURE:   Colonoscopy, diagnostic incomplete INDICATIONS:iron deficiency anemia. MEDICATIONS: deep sedation per Dr.  [facility]/robinal and atropine ASA CLASS:       Class III  CONSENT: The risks, benefits, alternatives and imponderables including but not limited to bleeding, perforation as well as the possibility of a missed lesion have been reviewed.  The potential for biopsy, lesion removal, etc. have also been discussed. Questions have been answered.  All parties agreeable.  Please see the history and physical in the medical record for more information.  DESCRIPTION OF PROCEDURE:   After the risks benefits and alternatives of the procedure were thoroughly explained, informed consent was obtained.  The digital rectal exam revealed no rectal mass.   The     endoscope was introduced through the anus and advanced to the descending colon.      The quality of the prep was The instrument was then slowly withdrawn as the colon was fully examined.      COLON FINDINGS: The scope was easily advanced from the rectum well into the descending colon.  It was a straight shot.  During this advancement, the patient was noted to suddenly become bradycardiac in the 20s.  The scope was withdrawn.  The patient remained bradycardia in the 20s for 2-3 minutes in spite of Robinol and atropine.  He did not require CPR.  His heart rate subsequently rebounded.  Procedure terminated.  Was moved to the PACU.  Where he remained stable.   Not applicable.  Retroflexion was not performed. .  Withdrawal time=minutes 0 seconds.  The scope was withdrawn and the procedure completed.cecal withdrawal  time not applicable. COMPLICATIONS: There were no immediate complications.  ENDOSCOPIC IMPRESSION: Incomplete colonoscopy secondary termination of procedure because of bradycardia  RECOMMENDATIONS: Discussed  with Dr. Patsey Berthold. I discussed with Dr. Domenic Polite, the cardiologist. I discussed with Dr. Roderic Palau the hospitalist. He will be seen by cardiology in the PACU. He will be observed overnight at a minimum. We'll have to plan on assessing his colon at a later date.    I have discussed at length with the patient's sister, who accompanies him today at the hospital.  eSigned:  R. Garfield Cornea, MD [facility] Aug 08, 2014 11:14 AM Revised: 08-08-14 11:14 AM  cc:  CPT CODES: ICD CODES:  The ICD and CPT codes recommended by this software are interpretations from the data that the clinical staff has captured with the software.  The verification of the translation of this report to the ICD and CPT codes and modifiers is the sole responsibility of the health care institution and practicing physician where this report was generated.  [place]. will not be held responsible for the validity of the ICD and CPT codes included on this report.  AMA assumes no liability for data contained or not contained herein. CPT is a Designer, television/film set of the [employer].

## 2014-07-21 NOTE — H&P (Signed)
Patient seen and examined. Note reviewed.  Patient was undergoing EGD and colonoscopy today for further evaluation of anemia. During his colonoscopy, he became bradycardic with a heart rate in the 20s. Patient received one dose of atropine with improvement of his heart rate. He was seen by cardiology and was noted that he did not have any history of conduction system disease. EKG showed sinus tachycardia. It was felt that his bradycardia may be related to medications received as well as vagal response during colonoscopy. He'll be monitored overnight. If his heart rate/blood pressure remained stable, he can likely discharge home tomorrow.  MEMON,JEHANZEB

## 2014-07-22 ENCOUNTER — Encounter (HOSPITAL_COMMUNITY): Payer: Self-pay | Admitting: Internal Medicine

## 2014-07-22 ENCOUNTER — Other Ambulatory Visit: Payer: Self-pay | Admitting: Family Medicine

## 2014-07-22 DIAGNOSIS — D509 Iron deficiency anemia, unspecified: Secondary | ICD-10-CM | POA: Diagnosis not present

## 2014-07-22 LAB — GLUCOSE, CAPILLARY
GLUCOSE-CAPILLARY: 134 mg/dL — AB (ref 70–99)
Glucose-Capillary: 123 mg/dL — ABNORMAL HIGH (ref 70–99)

## 2014-07-22 LAB — HEMOGLOBIN A1C
Hgb A1c MFr Bld: 7 % — ABNORMAL HIGH (ref <5.7)
Mean Plasma Glucose: 154 mg/dL — ABNORMAL HIGH (ref <117)

## 2014-07-22 LAB — TROPONIN I

## 2014-07-22 LAB — TSH: TSH: 4.59 u[IU]/mL — AB (ref 0.350–4.500)

## 2014-07-22 NOTE — Telephone Encounter (Signed)
Refill appropriate and filled per protocol. 

## 2014-07-22 NOTE — Discharge Summary (Signed)
Physician Discharge Summary  Michael [name] MVV:[id number] DOB: November 09, 1945 DOA: 07/21/2014  PCP: Vic Blackbird, MD  Admit date: 07/21/2014 Discharge date: 07/22/2014  Time spent: 40 minutes  Recommendations for Outpatient Follow-up:  1. Follow up with Dr Gala Romney in 1-2 weeks for reschedule  Discharge Diagnoses:  Principal Problem:   Bradycardia Active Problems:   Type II diabetes mellitus with neurological manifestations   Hyperlipidemia   OBESITY, MORBID   Essential hypertension   GERD   Anemia   Sinus pause   Discharge Condition: stable  Diet recommendation: heart healthy carb modified  Filed Weights   07/21/14 1355 07/22/14 0500  Weight: 90.8 kg (200 lb 2.8 oz) 90.5 kg (199 lb 8.3 oz)    History of present illness:  Michael [name] is a very pleasant 68 y.o. male with a past medical history that includes diabetes type 2, COPD, hypertension, iron deficiency anemia, NASH who while undergoing colonoscopy 07/21/14 developed severe bradycardia with a heart rate in the 20s for 2-3 minutes per GI note. Procedure was interrupted [facility] asked to admit.  Chart review indicated patient scheduled for upper and lower endoscopy with Dr. Gala Romney for evaluation of ongoing iron deficiency anemia. Patient was given Versed and fentanyl underwent EGD without problem. He was given propofol for colonoscopy but developed severe bradycardia. He was treated with robinul and atropine and the procedure was terminated. His heart rate rebounded quickly. He was evaluated by Dr. Domenic Polite with cardiology while in the PACU. who opined severe bradycardia and sinus pauses likely related to medications and increased vagal tone during colonoscopy.  The time of my exam he was afebrile hemodynamically stable with a heart rate of 98. He denied any chest pain palpitations or history of shortness of breath.    Hospital Course:  Bradycardia/sinus pause: Evaluated in the PACU by cardiology who opined likely  secondary to medications and increased vagal tone during colonoscopy. No history of chest pain palpitations shortness of breath syncope or near-syncope. EKG with sinus tach. Repeat EKG no acute changes. No further episodes  Active Problems:  Anemia: Chronic iron deficiency anemia. Being followed by Dr. Donzetta Kohut. Chart review indicates patient had GI evaluation in 2011 at [facility]. EGD done 07/21/14 reveals inflammatory changes involving the gastric mucosa most likely NSAID related. Colonoscopy interrupted as noted above. Dr Roseanne Kaufman office will contact for follow up   Type II diabetes mellitus with neurological manifestations: Chart review indicates hemoglobin A1c 7 2 months ago. Provided with education regarding diet for improved glucose control and weight loss.   Hyperlipidemia  Essential hypertension: Controlled.   GERD: Continue PPI  Arthritis. History of bilateral knee and hip pain. Reports his PCP recommended a walker. Will request physical therapy evaluation tomorrow may benefit from home health    Procedures:  EGD 07/21/14  Consultations:  Cardiology   GI  Discharge Exam: [name] Vitals:   07/22/14 1202  BP:   Pulse:   Temp: 98.7 F (37.1 C)  Resp:     General: well nourished sitting on side of bed Cardiovascular: RRR No MGR No LE edema Respiratory: normal effort BS clear bilaterally  Discharge Instructions You were cared for by a hospitalist during your hospital stay. If you have any questions about your discharge medications or the care you received while you were in the hospital after you are discharged, you can call the unit and asked to speak with the hospitalist on call if the hospitalist that took care of you is not available. Once you  are discharged, your primary care physician will handle any further medical issues. Please note that NO REFILLS for any discharge medications will be authorized once you are discharged, as it is imperative that you return  to your primary care physician (or establish a relationship with a primary care physician if you do not have one) for your aftercare needs so that they can reassess your need for medications and monitor your lab values.  Discharge Instructions   Diet - low sodium heart healthy    Complete by:  As directed      Discharge instructions    Complete by:  As directed   Take medications as directed Dr Roseanne Kaufman office will follow up     Increase activity slowly    Complete by:  As directed           Current Discharge Medication List    CONTINUE these medications which have NOT CHANGED   Details  amLODipine (NORVASC) 10 MG tablet Take 10 mg by mouth daily.    aspirin EC 81 MG tablet Take 81 mg by mouth daily.    B-D INS SYRINGE 0.5CC/31GX5/16 31G X 5/16" 0.5 ML MISC     benazepril (LOTENSIN) 40 MG tablet Take 1 tablet (40 mg total) by mouth daily. Qty: 30 tablet, Refills: 3    diclofenac sodium (VOLTAREN) 1 % GEL Apply 1 application topically 4 (four) times daily. Qty: 5 Tube, Refills: 3   Associated Diagnoses: Patellar tendonitis    Ferrous Sulfate (IRON) 325 (65 FE) MG TABS Take 1 tablet by mouth daily.     gabapentin (NEURONTIN) 400 MG capsule Take 400 mg by mouth 2 (two) times daily.    glipiZIDE (GLUCOTROL) 10 MG tablet Take 10 mg by mouth 2 (two) times daily before a meal.    hydrochlorothiazide (HYDRODIURIL) 25 MG tablet Take 25 mg by mouth daily.    HYDROcodone-acetaminophen (NORCO) 10-325 MG per tablet Take 1 tablet by mouth every 6 (six) hours as needed for moderate pain. Qty: 90 tablet, Refills: 0    insulin aspart protamine- aspart (NOVOLOG MIX 70/30) (70-30) 100 UNIT/ML injection Inject 23 Units into the skin 2 (two) times daily with a meal.    meloxicam (MOBIC) 15 MG tablet Take 15 mg by mouth daily.     omeprazole (PRILOSEC) 20 MG capsule Take 1 capsule (20 mg total) by mouth 2 (two) times daily. Qty: 60 capsule, Refills: 6    polyethylene glycol-electrolytes  (TRILYTE) 420 G solution Take 4,000 mLs by mouth as directed. Qty: 4000 mL, Refills: 0    potassium chloride (K-DUR,KLOR-CON) 10 MEQ tablet Take 10 mEq by mouth daily as needed (when taking torsemide).    pravastatin (PRAVACHOL) 40 MG tablet Take 40 mg by mouth daily.    torsemide (DEMADEX) 20 MG tablet Take 20 mg by mouth daily as needed (swelling).    metFORMIN (GLUCOPHAGE) 1000 MG tablet TAKE 1 TABLET (1,000 MG TOTAL) BY MOUTH 2 (TWO) TIMES DAILY WITH A MEAL. Qty: 60 tablet, Refills: 6    naproxen (NAPROSYN) 375 MG tablet        No Known Allergies Follow-up Information   Follow up with Manus Rudd, MD. (office will contact for follow)    Specialty:  Gastroenterology   Contact information:   [address] [place] [address] [phone]        The results of significant diagnostics from this hospitalization (including imaging, microbiology, ancillary and laboratory) are listed below for reference.    Significant Diagnostic  Studies: No results found.  Microbiology: Recent Results (from the past 240 hour(s))  MRSA PCR SCREENING     Status: None   Collection Time    07/21/14  2:15 PM      Result Value Ref Range Status   MRSA by PCR NEGATIVE  NEGATIVE Final   Comment:            The GeneXpert MRSA Assay (FDA     approved for NASAL specimens     only), is one component of a     comprehensive MRSA colonization     surveillance program. It is not     intended to diagnose MRSA     infection nor to guide or     monitor treatment for     MRSA infections.     Labs: Basic Metabolic Panel:  Recent Labs Lab 07/21/14 1505  NA 136*  K 4.6  CL 97  CO2 29  GLUCOSE 226*  BUN 7  CREATININE 0.78  CALCIUM 9.7  MG 1.8   Liver Function Tests:  Recent Labs Lab 07/21/14 1505  AST 68*  ALT 70*  ALKPHOS 73  BILITOT 0.2*  PROT 7.0  ALBUMIN 3.9   No results found for this basename: LIPASE, AMYLASE,  in the last 168 hours No results found for this basename:  AMMONIA,  in the last 168 hours CBC:  Recent Labs Lab 07/21/14 1505  WBC 3.8*  HGB 10.5*  HCT 33.8*  MCV 70.4*  PLT 247   Cardiac Enzymes:  Recent Labs Lab 07/21/14 1505 07/21/14 1924 07/22/14 0114  TROPONINI <0.30 <0.30 <0.30   BNP: BNP (last 3 results) No results found for this basename: PROBNP,  in the last 8760 hours CBG:  Recent Labs Lab 07/21/14 0939 07/21/14 1715 07/21/14 2116 07/22/14 0746 07/22/14 1117  GLUCAP 131* 180* 131* 123* 134*       Signed:  Michelangelo [name] M  [facility] 07/22/2014, 12:24 PM

## 2014-07-22 NOTE — Plan of Care (Signed)
Problem: Phase I Progression Outcomes Goal: Pain controlled with appropriate interventions Outcome: Progressing Taking PRN's as ordered for pain Goal: OOB as tolerated unless otherwise ordered Outcome: Completed/Met Date Met:  07/22/14 OOB, BRP, walks with a cane without incident, gait steady Goal: Initial discharge plan identified Outcome: Completed/Met Date Met:  07/22/14 Home Goal: Voiding-avoid urinary catheter unless indicated Outcome: Completed/Met Date Met:  07/22/14 Voiding without issue    Goal: Hemodynamically stable Outcome: Progressing Vital signs remain stable without intervention

## 2014-07-22 NOTE — Progress Notes (Signed)
REVIEWED.  

## 2014-07-22 NOTE — Evaluation (Signed)
Physical Therapy Evaluation Patient Details Name: KENDALE REMBOLD MRN: [id number] DOB: 24-Apr-1946 Today's Date: 07/22/2014   History of Present Illness  JACOREY DONAWAY is a very pleasant 68 y.o. male with a past medical history that includes diabetes type 2, COPD, hypertension, iron deficiency anemia, NASH who while undergoing colonoscopy today developed severe bradycardia with a heart rate in the 20s for 2-3 minutes per GI note. Procedure was interrupted [facility] hospitalists asked to admit.  Clinical Impression  Pt presents with some general LE weakness and decrease balance affecting his mobility and Independence. Pt lives alone and ambulated with a quad cane. Pt is currently not safe to use his cane without assistance. Pt is modified Independent with a RW. Pt is agreeable to use a RW at home. PT recommends d/c home with RW and follow-up HHPT for LE strengthening, balance and improved safety. Pt is agreeable to above recommendations. Pt is d/c from acute PT services and follow-up with HHPT.    Follow Up Recommendations Home health PT    Equipment Recommendations  None recommended by PT    Recommendations for Other Services       Precautions / Restrictions Precautions Precautions: Fall Restrictions Weight Bearing Restrictions: No      Mobility  Bed Mobility Overal bed mobility: Modified Independent                Transfers Overall transfer level: Modified independent Equipment used: Rolling walker (2 wheeled)             General transfer comment: verbal cues to increase forward weight shift to aaist with initial rise up, cues for hand placement with RW. After therapist cuing, pt returned demonstrated transfer with mod. Independence.  Ambulation/Gait Ambulation/Gait assistance: Modified independent (Device/Increase time) Ambulation Distance (Feet): 90 Feet Assistive device: Rolling walker (2 wheeled) Gait Pattern/deviations: Step-through pattern;Decreased stride  length;Trunk flexed Gait velocity: decreased Gait velocity interpretation: Below normal speed for age/gender General Gait Details: Pt ambulated with a quad PTA. Pt is currently min assist with quad cane on level surfaces. Pt with significant trunk flexion and imbalance with cane. Recommend using a RW for improved safety and Independence.  Stairs            Wheelchair Mobility    Modified Rankin (Stroke Patients Only)       Balance Overall balance assessment: Needs assistance   Sitting balance-Leahy Scale: Good     Standing balance support: No upper extremity supported Standing balance-Leahy Scale: Fair                               Pertinent Vitals/Pain Pain Assessment: No/denies pain    Home Living Family/patient expects to be discharged to:: Private residence Living Arrangements: Alone   Type of Home: Apartment Home Access: Stairs to enter   [employer] of Steps: 1 Home Layout: One level Home Equipment: Walker - 2 wheels;Cane - quad      Prior Function Level of Independence: Independent with assistive device(s)               Hand Dominance        Extremity/Trunk Assessment   Upper Extremity Assessment: Defer to OT evaluation           Lower Extremity Assessment: Generalized weakness         Communication   Communication: No difficulties  Cognition Arousal/Alertness: Awake/alert Behavior During Therapy: WFL for tasks assessed/performed Overall Cognitive Status: Within  Functional Limits for tasks assessed                      General Comments      Exercises General Exercises - Lower Extremity Long Arc Quad: AROM;Strengthening;Both;5 reps;Seated Hip ABduction/ADduction: AROM;Strengthening;Both;5 reps;Seated (isometric adductor pillow squeeze) Hip Flexion/Marching: AROM;Strengthening;Both;5 reps;Standing Heel Raises: AROM;Strengthening;Both;5 reps;Standing Mini-Sqauts: AROM;Strengthening;Both;5  reps;Standing      Assessment/Plan    PT Assessment All further PT needs can be met in the next venue of care  PT Diagnosis Difficulty walking;Generalized weakness   PT Problem List Decreased strength;Decreased balance;Decreased mobility;Decreased activity tolerance  PT Treatment Interventions     PT Goals (Current goals can be found in the Care Plan section)      Frequency     Barriers to discharge        Co-evaluation               End of Session Equipment Utilized During Treatment: Gait belt Activity Tolerance: Patient tolerated treatment well Patient left: in bed;with call bell/phone within reach Nurse Communication: Mobility status    Functional Assessment Tool Used: clinical judgement Functional Limitation: Mobility: Walking and moving around Mobility: Walking and Moving Around Current Status (R4854): At least 1 percent but less than 20 percent impaired, limited or restricted Mobility: Walking and Moving Around Goal Status [phone]): At least 1 percent but less than 20 percent impaired, limited or restricted Mobility: Walking and Moving Around Discharge Status [phone]): At least 1 percent but less than 20 percent impaired, limited or restricted    Time: 0814-0844 PT Time Calculation (min): 30 min   Charges:   PT Evaluation $Initial PT Evaluation Tier I: 1 Procedure PT Treatments $Gait Training: 8-22 mins   PT G Codes:   Functional Assessment Tool Used: clinical judgement Functional Limitation: Mobility: Walking and moving around    Constantine, Colon Flattery 07/22/2014, 8:54 AM

## 2014-07-22 NOTE — Progress Notes (Signed)
Telemetry shows NSR and sinus tach overnight, no bradycardia or pauses. EKG this AM NSR. No further cardiac testing indicated at this time. Will signoff inpatient care.   [name] MD

## 2014-07-22 NOTE — Discharge Summary (Signed)
Patient seen and examined. Note reviewed.  This patient was admitted to the hospital after he was noted to be significantly bradycardic during colonoscopy. Patient was undergoing endoscopy and colonoscopy for evaluation of anemia. During his colonoscopy he became bradycardic with a heart rate into the 20s. This lasted several minutes and he received one dose of atropine. Bradycardia then resolved and he did not have any recurrence. EKG did not show any signs of heart block. He was seen by cardiology they recommended overnight observation. EKG was repeated the following morning and again did not show any acute abnormalities. Cardiac enzymes were found to be negative. The patient is otherwise asymptomatic. His bradycardia was felt to be related to medications versus possible vagal response during colonoscopy. He is otherwise stable for discharge today.  Michael [name]

## 2014-07-22 NOTE — Progress Notes (Signed)
Subjective:  Feels fine. No further bradycardia last night.   Objective: Vital signs in last 24 hours: Temp:  [97.7 F (36.5 C)-98.8 F (37.1 C)] 98 F (36.7 C) (10/23 0400) Pulse Rate:  [71-113] 75 (10/23 0700) Resp:  [11-47] 14 (10/23 0700) BP: (77-149)/(28-126) 134/50 mmHg (10/23 0700) SpO2:  [88 %-100 %] 99 % (10/23 0700) Weight:  [199 lb 8.3 oz (90.5 kg)-200 lb 2.8 oz (90.8 kg)] 199 lb 8.3 oz (90.5 kg) (10/23 0500) Last BM Date: 07/21/14 General:   Alert,  Well-developed, well-nourished, pleasant and cooperative in NAD Head:  Normocephalic and atraumatic. Eyes:  Sclera clear, no icterus.  Abdomen:  Soft, nontender and nondistended.  Extremities:  Without clubbing, deformity or edema. Neurologic:  Alert and  oriented x4;  grossly normal neurologically. Skin:  Intact without significant lesions or rashes. Psych:  Alert and cooperative. Normal mood and affect.  Intake/Output from previous day: 10/22 0701 - 10/23 0700 In: 2023 [P.O.:1320; I.V.:703] Out: 2160 [Urine:2160] Intake/Output this shift:    Lab Results: CBC  Recent Labs  07/21/14 1505  WBC 3.8*  HGB 10.5*  HCT 33.8*  MCV 70.4*  PLT 247   BMET  Recent Labs  07/21/14 1505  NA 136*  K 4.6  CL 97  CO2 29  GLUCOSE 226*  BUN 7  CREATININE 0.78  CALCIUM 9.7   LFTs  Recent Labs  07/21/14 1505  BILITOT 0.2*  ALKPHOS 73  AST 68*  ALT 70*  PROT 7.0  ALBUMIN 3.9   No results found for this basename: LIPASE,  in the last 72 hours PT/INR No results found for this basename: LABPROT, INR,  in the last 72 hours    Imaging Studies: No results found.[2 weeks]   Assessment: 68 y/o with h/o IDA who underwent EGD yesterday which revealed inflammatory changes involving gastric mucosa (s/p bx) and while undergoing colonoscopy he developed bradycardia in the 20s which lasted for 2-3 minutes after scope was removed and given Robinol and atropine. Procedure terminated and patient admitted for observation.    Hepatic fibrosis, Metavir F4, likely NASH. Followed as outpatient. Mild bump in AST/ALT yesterday. F/u as outpatient as already scheduled.   Plan: 1. F/u pending path. 2. Will plan on elective colonoscopy at a later date.    LOS: 1 day   Neil Crouch  07/22/2014, 8:11 AM

## 2014-07-22 NOTE — Progress Notes (Signed)
Pt is to be discharged home today. Pt is in NAD, IV is out, all paperwork has been reviewed/discussed with patient, and there are no questions/concerns at this time. Assessment is unchanged from this morning. Pt is to be accompanied downstairs by staff and family via wheelchair.  

## 2014-07-24 ENCOUNTER — Encounter: Payer: Self-pay | Admitting: Internal Medicine

## 2014-07-25 ENCOUNTER — Telehealth: Payer: Self-pay

## 2014-07-25 ENCOUNTER — Encounter: Payer: Self-pay | Admitting: Internal Medicine

## 2014-07-25 NOTE — Telephone Encounter (Signed)
Letter from: Daneil Dolin    Send letter to patient.  Send copy of letter with path to referring provider and PCP.   Need ov to re-group and get tcs re-scheduled with propofol. Will need pre-medicaion w atropine

## 2014-07-25 NOTE — Telephone Encounter (Signed)
APPT MADE, LETTER SENT

## 2014-07-25 NOTE — Anesthesia Postprocedure Evaluation (Signed)
  Anesthesia Post-op Note  Patient: Michael [name]  Procedure(s) Performed: Procedure(s): ATTEMPTED COLONOSCOPY WITH PROPOFOL-HAD TO STOP DUE TO BRADYCARDIA (N/A) ESOPHAGOGASTRODUODENOSCOPY (EGD) WITH PROPOFOL (N/A) BIOPSY (N/A)  Patient Location: room 3a  Anesthesia Type:MAC  Level of Consciousness: awake, alert , oriented and patient cooperative  Airway and Oxygen Therapy: Patient Spontanous Breathing and Patient connected to face mask oxygen  Post-op Pain: none  Post-op Assessment: Post-op Vital signs reviewed, Patient's Cardiovascular Status Stable, Respiratory Function Stable, Patent Airway and No signs of Nausea or vomiting  Post-op Vital Signs: Reviewed and stable  Last Vitals:  Filed Vitals:   07/22/14 1202  BP:   Pulse:   Temp: 37.1 C  Resp:     Complications: No apparent anesthesia complications

## 2014-07-25 NOTE — Telephone Encounter (Signed)
Letter mailed to pt. Michael [name], please schedule ov

## 2014-07-25 NOTE — Addendum Note (Signed)
Addendum created 07/25/14 0845 by Charmaine Downs, CRNA   Modules edited: Notes Section   Notes Section:  File: [id number]

## 2014-07-28 ENCOUNTER — Other Ambulatory Visit: Payer: Self-pay | Admitting: Family Medicine

## 2014-07-28 NOTE — Telephone Encounter (Signed)
Refill appropriate and filled per protocol. 

## 2014-08-02 ENCOUNTER — Telehealth: Payer: Self-pay | Admitting: Family Medicine

## 2014-08-02 NOTE — Telephone Encounter (Signed)
I did change to every 6 hours if needed but still quantity 90, so go ahead and dispense 90, he can come get a new script for December and I will increase pill count to 100, they can tear up the script that states december I dont want him taking four every single day

## 2014-08-02 NOTE — Telephone Encounter (Signed)
Pharmacy given provider directives and will inform patient

## 2014-08-02 NOTE — Telephone Encounter (Signed)
Pt had Rx dated 06/15/14 for Hydrocodone #90 q8hr and he filled it om 07/12/14.  Now he is there today with two new Rx's.  Dated 07/13/14.  They are for #90 but Q6hrs.  This is not a 30 day supply??  Pharmacy wants to clarify.  Pt says he is out, if taking 4 x day, he would be out today?  Please advise?

## 2014-08-23 ENCOUNTER — Other Ambulatory Visit: Payer: Self-pay | Admitting: Family Medicine

## 2014-08-23 NOTE — Telephone Encounter (Signed)
Medication refilled per protocol. 

## 2014-08-27 ENCOUNTER — Other Ambulatory Visit: Payer: Self-pay | Admitting: Family Medicine

## 2014-08-30 ENCOUNTER — Ambulatory Visit: Payer: Commercial Managed Care - HMO | Admitting: Gastroenterology

## 2014-08-30 NOTE — Telephone Encounter (Signed)
Refill appropriate and filled per protocol. 

## 2014-08-31 ENCOUNTER — Other Ambulatory Visit: Payer: Self-pay | Admitting: *Deleted

## 2014-08-31 DIAGNOSIS — M7650 Patellar tendinitis, unspecified knee: Secondary | ICD-10-CM

## 2014-08-31 MED ORDER — DICLOFENAC SODIUM 1 % TD GEL: 4.0000 g | Freq: Four times a day (QID) | TRANSDERMAL | Status: DC

## 2014-09-02 ENCOUNTER — Ambulatory Visit (INDEPENDENT_AMBULATORY_CARE_PROVIDER_SITE_OTHER): Payer: Commercial Managed Care - HMO | Admitting: Family Medicine

## 2014-09-02 ENCOUNTER — Encounter: Payer: Self-pay | Admitting: Family Medicine

## 2014-09-02 VITALS — BP 136/78 | HR 82 | Temp 98.3°F | Resp 16 | Ht 60.0 in | Wt 207.0 lb

## 2014-09-02 DIAGNOSIS — M1712 Unilateral primary osteoarthritis, left knee: Secondary | ICD-10-CM

## 2014-09-02 DIAGNOSIS — M4806 Spinal stenosis, lumbar region: Secondary | ICD-10-CM

## 2014-09-02 DIAGNOSIS — M48061 Spinal stenosis, lumbar region without neurogenic claudication: Secondary | ICD-10-CM

## 2014-09-02 MED ORDER — HYDROCODONE-ACETAMINOPHEN 10-325 MG PO TABS: 1.0000 | ORAL_TABLET | Freq: Four times a day (QID) | ORAL | Status: DC | PRN

## 2014-09-02 NOTE — Patient Instructions (Signed)
Pain medications refilled F/U as previous

## 2014-09-02 NOTE — Progress Notes (Signed)
Patient ID: Michael [name], male   DOB: 1945/10/07, 68 y.o.   MRN: [id number]   Subjective:    Patient ID: Michael [name], male    DOB: 1946/07/16, 68 y.o.   MRN: [id number]  Patient presents for Follow-up  patient here for medication check. He was unable to get his hydrocodone I was planning to give him 100 tablets for this month but he does not have the prescription. She is doing fairly well with the hydrocodone but does have to take an extra one every now live for his knees. He is still being followed by orthopedics. Of note he was recently admitted secondary to a reaction with purple fall while he was undergoing his colonoscopy he had severe bradycardia and at one point his heart stop and CPR was initiated he was seen by cardiology didn't think that this was likely a fluke is not have any known cardiac problems. There was unable to proceed with the colonoscopy    Review Of Systems:  GEN- denies fatigue, fever, weight loss,weakness, recent illness HEENT- denies eye drainage, change in vision, nasal discharge, CVS- denies chest pain, palpitations RESP- denies SOB, cough, wheeze ABD- denies N/V, change in stools, abd pain GU- denies dysuria, hematuria, dribbling, incontinence MSK- +joint pain, muscle aches, injury Neuro- denies headache, dizziness, syncope, seizure activity       Objective:    BP 136/78 mmHg  Pulse 82  Temp(Src) 98.3 F (36.8 C)  Resp 16  Ht 5' (1.524 m)  Wt 207 lb (93.895 kg)  BMI 40.43 kg/m2 GEN- NAD, alert and oriented x3 CVS- RRR, no murmur RESP-CTAB EXT- pedal  edema Pulses- Radial 2+        Assessment & Plan:      Problem List Items Addressed This Visit    None      Note: This dictation was prepared with Dragon dictation along with smaller phrase technology. Any transcriptional errors that result from this process are unintentional.

## 2014-09-02 NOTE — Assessment & Plan Note (Addendum)
Pain meds changed to quantity 100, no other changes needed Handicap form also completed for permanent card Scripts given for Dec and Jan

## 2014-09-04 ENCOUNTER — Other Ambulatory Visit: Payer: Self-pay | Admitting: Family Medicine

## 2014-09-06 NOTE — Telephone Encounter (Signed)
Refill appropriate and filled per protocol. 

## 2014-09-27 ENCOUNTER — Other Ambulatory Visit: Payer: Self-pay | Admitting: Family Medicine

## 2014-10-12 ENCOUNTER — Encounter: Payer: Self-pay | Admitting: Family Medicine

## 2014-10-12 ENCOUNTER — Ambulatory Visit (INDEPENDENT_AMBULATORY_CARE_PROVIDER_SITE_OTHER): Payer: Medicare Other | Admitting: Family Medicine

## 2014-10-12 ENCOUNTER — Ambulatory Visit: Payer: Commercial Managed Care - HMO | Admitting: Family Medicine

## 2014-10-12 VITALS — BP 134/70 | HR 72 | Temp 98.0°F | Resp 16 | Ht 60.0 in | Wt 204.0 lb

## 2014-10-12 DIAGNOSIS — R7989 Other specified abnormal findings of blood chemistry: Secondary | ICD-10-CM

## 2014-10-12 DIAGNOSIS — I10 Essential (primary) hypertension: Secondary | ICD-10-CM | POA: Diagnosis not present

## 2014-10-12 DIAGNOSIS — B372 Candidiasis of skin and nail: Secondary | ICD-10-CM

## 2014-10-12 DIAGNOSIS — K7581 Nonalcoholic steatohepatitis (NASH): Secondary | ICD-10-CM | POA: Diagnosis not present

## 2014-10-12 DIAGNOSIS — M1712 Unilateral primary osteoarthritis, left knee: Secondary | ICD-10-CM | POA: Diagnosis not present

## 2014-10-12 DIAGNOSIS — G629 Polyneuropathy, unspecified: Secondary | ICD-10-CM

## 2014-10-12 DIAGNOSIS — E785 Hyperlipidemia, unspecified: Secondary | ICD-10-CM

## 2014-10-12 DIAGNOSIS — E1149 Type 2 diabetes mellitus with other diabetic neurological complication: Secondary | ICD-10-CM

## 2014-10-12 DIAGNOSIS — E1140 Type 2 diabetes mellitus with diabetic neuropathy, unspecified: Secondary | ICD-10-CM | POA: Diagnosis not present

## 2014-10-12 LAB — CBC WITH DIFFERENTIAL/PLATELET
BASOS PCT: 1 % (ref 0–1)
Basophils Absolute: 0.1 10*3/uL (ref 0.0–0.1)
Eosinophils Absolute: 0.1 10*3/uL (ref 0.0–0.7)
Eosinophils Relative: 1 % (ref 0–5)
HCT: 34.1 % — ABNORMAL LOW (ref 39.0–52.0)
Hemoglobin: 11.2 g/dL — ABNORMAL LOW (ref 13.0–17.0)
Lymphocytes Relative: 26 % (ref 12–46)
Lymphs Abs: 1.5 10*3/uL (ref 0.7–4.0)
MCH: 24.2 pg — ABNORMAL LOW (ref 26.0–34.0)
MCHC: 32.8 g/dL (ref 30.0–36.0)
MCV: 73.7 fL — AB (ref 78.0–100.0)
MPV: 8.7 fL (ref 8.6–12.4)
Monocytes Absolute: 0.4 10*3/uL (ref 0.1–1.0)
Monocytes Relative: 8 % (ref 3–12)
NEUTROS ABS: 3.6 10*3/uL (ref 1.7–7.7)
Neutrophils Relative %: 64 % (ref 43–77)
Platelets: 301 10*3/uL (ref 150–400)
RBC: 4.63 MIL/uL (ref 4.22–5.81)
RDW: 17.8 % — AB (ref 11.5–15.5)
WBC: 5.6 10*3/uL (ref 4.0–10.5)

## 2014-10-12 LAB — T3, FREE: T3, Free: 2.9 pg/mL (ref 2.3–4.2)

## 2014-10-12 LAB — TSH: TSH: 6.124 u[IU]/mL — ABNORMAL HIGH (ref 0.350–4.500)

## 2014-10-12 LAB — T4, FREE: Free T4: 1.16 ng/dL (ref 0.80–1.80)

## 2014-10-12 MED ORDER — INSULIN GLARGINE 100 UNIT/ML SOLOSTAR PEN: 30.0000 [IU] | PEN_INJECTOR | Freq: Every day | SUBCUTANEOUS | Status: DC

## 2014-10-12 MED ORDER — NYSTATIN 100000 UNIT/GM EX CREA: 1.0000 "application " | TOPICAL_CREAM | Freq: Two times a day (BID) | CUTANEOUS | Status: DC

## 2014-10-12 NOTE — Assessment & Plan Note (Signed)
Recheck liver function as well as cholesterol level from recent hospitalization

## 2014-10-12 NOTE — Assessment & Plan Note (Signed)
Blood pressure well controlled

## 2014-10-12 NOTE — Assessment & Plan Note (Signed)
A1C at goal, d/c 70/30 due to coverage change to Lantus 30 units at bedtime, continue oral meds

## 2014-10-12 NOTE — Patient Instructions (Addendum)
Reschedule with foot doctor Use fungal cream twice a day  Insulin changed to Lantus 30 units at bedtime, continue Metformin We will call with lab results Use the antibiotic cream once a day  F/U 3 months

## 2014-10-12 NOTE — Assessment & Plan Note (Signed)
Continue with pain medication and follow-up with orthopedics

## 2014-10-12 NOTE — Assessment & Plan Note (Signed)
Continue gabapentin he will also make a follow-up with podiatry for nail trimming and foot care

## 2014-10-12 NOTE — Progress Notes (Signed)
Patient ID: Michael [name], male   DOB: [birth year], 68 y.o.   MRN: [id number]   Subjective:    Patient ID: Michael [name], male    DOB: 1945/11/10, 69 y.o.   MRN: [id number]  Patient presents for F/U and Rash  patient to follow-up chronic medical problems. Diabetes mellitus his last A1c was 7% 2 months ago he needs to switch his insulin as it is no longer covered by his insurance. No hypoglycemia symptoms. He did not bring his meter with him today.  He is concerned about a rash that has been on his buttocks for the past few weeks. He thinks it started when he received a new shower bench. It was red and itchy he started using some cortisone and then some type of wound care cream he did discontinue this as it dried up however then he noticed that it was red again and he used Voltarem gel and ETOH and  the skin opened up. He has not had any drainage from the lesion recently    Review Of Systems:  GEN- denies fatigue, fever, weight loss,weakness, recent illness HEENT- denies eye drainage, change in vision, nasal discharge, CVS- denies chest pain, palpitations RESP- denies SOB, cough, wheeze ABD- denies N/V, change in stools, abd pain GU- denies dysuria, hematuria, dribbling, incontinence MSK- +joint pain, muscle aches, injury Neuro- denies headache, dizziness, syncope, seizure activity       Objective:    BP 134/70 mmHg  Pulse 72  Temp(Src) 98 F (36.7 C) (Oral)  Resp 16  Ht 5' (1.524 m)  Wt 204 lb (92.534 kg)  BMI 39.84 kg/m2 GEN- NAD, alert and oriented x3 HEENT- PERRL, EOMI, non injected sclera, pink conjunctiva, MMM, oropharynx clear Neck- Supple, no thyromegaly CVS- RRR, no murmur RESP-CTAB Skin- erythema of gluteal clert, small quarter size of redness with scab right cheek, no induration, no fluctuance, mild TTP EXT- pedal L >R  edema Pulses- Radial, DP- 2+        Assessment & Plan:      Problem List Items Addressed This Visit      Unprioritized   Type II  diabetes mellitus with neurological manifestations   Relevant Medications   Insulin Glargine (LANTUS) 100 UNIT/ML Solostar Pen   Peripheral neuropathy   Osteoarthritis of left knee   OBESITY, MORBID   Relevant Medications   Insulin Glargine (LANTUS) 100 UNIT/ML Solostar Pen   NASH (nonalcoholic steatohepatitis)   Relevant Orders   Lipid panel   Essential hypertension - Primary   Relevant Orders   Comprehensive metabolic panel   CBC with Differential    Other Visit Diagnoses    Abnormal TSH        Relevant Orders    TSH    T3, free    T4, free    Candidal intertrigo        He has some use in the gluteal cleft with a lot of moisture however have him air dry and to use a little topical nystatin he will use topical antibiotic cream  On buttocks lesion    Relevant Medications    nystatin cream (MYCOSTATIN)       Note: This dictation was prepared with Dragon dictation along with smaller phrase technology. Any transcriptional errors that result from this process are unintentional.

## 2014-10-13 ENCOUNTER — Telehealth: Payer: Self-pay | Admitting: Family Medicine

## 2014-10-13 LAB — COMPREHENSIVE METABOLIC PANEL
ALBUMIN: 4.2 g/dL (ref 3.5–5.2)
ALT: 40 U/L (ref 0–53)
AST: 32 U/L (ref 0–37)
Alkaline Phosphatase: 59 U/L (ref 39–117)
BUN: 12 mg/dL (ref 6–23)
CALCIUM: 10 mg/dL (ref 8.4–10.5)
CHLORIDE: 86 meq/L — AB (ref 96–112)
CO2: 25 mEq/L (ref 19–32)
Creat: 0.69 mg/dL (ref 0.50–1.35)
Glucose, Bld: 100 mg/dL — ABNORMAL HIGH (ref 70–99)
POTASSIUM: 5 meq/L (ref 3.5–5.3)
Sodium: 127 mEq/L — ABNORMAL LOW (ref 135–145)
Total Bilirubin: 0.5 mg/dL (ref 0.2–1.2)
Total Protein: 6.8 g/dL (ref 6.0–8.3)

## 2014-10-13 LAB — LIPID PANEL
Cholesterol: 132 mg/dL (ref 0–200)
HDL: 42 mg/dL (ref >39)
LDL CALC: 68 mg/dL (ref 0–99)
Total CHOL/HDL Ratio: 3.1 Ratio
Triglycerides: 108 mg/dL (ref <150)
VLDL: 22 mg/dL (ref 0–40)

## 2014-10-13 NOTE — Telephone Encounter (Signed)
Patient called requesting that we fax a list of all of his copays to his landlord for reduction of rent, I agreed to do this until I spoke with Larene Beach and we concluded that we needed to mail this to him instead of faxing to landlord, I call the patient back to let him know this and it was fine with him, patient has no transportation.

## 2014-10-14 NOTE — Telephone Encounter (Signed)
I have printed and mailed patient an itemized statement for dates of service 09/30/13-09/29/14.

## 2014-10-17 ENCOUNTER — Other Ambulatory Visit: Payer: Self-pay | Admitting: *Deleted

## 2014-10-17 ENCOUNTER — Telehealth: Payer: Self-pay | Admitting: Family Medicine

## 2014-10-17 DIAGNOSIS — E871 Hypo-osmolality and hyponatremia: Secondary | ICD-10-CM

## 2014-10-17 MED ORDER — LEVOTHYROXINE SODIUM 25 MCG PO TABS: 25.0000 ug | ORAL_TABLET | Freq: Every day | ORAL | Status: DC

## 2014-11-02 ENCOUNTER — Telehealth: Payer: Self-pay | Admitting: Family Medicine

## 2014-11-02 MED ORDER — HYDROCODONE-ACETAMINOPHEN 10-325 MG PO TABS: 1.0000 | ORAL_TABLET | Freq: Four times a day (QID) | ORAL | Status: DC | PRN

## 2014-11-02 NOTE — Telephone Encounter (Signed)
Okay to refill give both Feb and March

## 2014-11-02 NOTE — Addendum Note (Signed)
Addended by: Sheral Flow on: 11/02/2014 02:01 PM   Modules accepted: Orders, Medications

## 2014-11-02 NOTE — Telephone Encounter (Signed)
Ok to refill??  Last office visit 10/12/2014.  Last refill 09/02/2014, prescription printed for 09/2014.

## 2014-11-02 NOTE — Telephone Encounter (Signed)
Prescription printed and patient made aware to come to office to pick up.

## 2014-11-02 NOTE — Telephone Encounter (Signed)
Patient calling to get refill on hydrocodone  [phone]

## 2014-11-07 DIAGNOSIS — E1151 Type 2 diabetes mellitus with diabetic peripheral angiopathy without gangrene: Secondary | ICD-10-CM | POA: Diagnosis not present

## 2014-11-07 DIAGNOSIS — E1140 Type 2 diabetes mellitus with diabetic neuropathy, unspecified: Secondary | ICD-10-CM | POA: Diagnosis not present

## 2014-11-23 ENCOUNTER — Other Ambulatory Visit: Payer: Self-pay | Admitting: Family Medicine

## 2014-11-30 ENCOUNTER — Ambulatory Visit (INDEPENDENT_AMBULATORY_CARE_PROVIDER_SITE_OTHER): Payer: Medicare Other | Admitting: Family Medicine

## 2014-11-30 ENCOUNTER — Encounter: Payer: Self-pay | Admitting: Family Medicine

## 2014-11-30 VITALS — BP 146/88 | HR 80 | Temp 98.8°F | Resp 18 | Ht 59.0 in | Wt 207.0 lb

## 2014-11-30 DIAGNOSIS — L03317 Cellulitis of buttock: Secondary | ICD-10-CM | POA: Diagnosis not present

## 2014-11-30 DIAGNOSIS — R609 Edema, unspecified: Secondary | ICD-10-CM

## 2014-11-30 DIAGNOSIS — E038 Other specified hypothyroidism: Secondary | ICD-10-CM

## 2014-11-30 DIAGNOSIS — E039 Hypothyroidism, unspecified: Secondary | ICD-10-CM | POA: Insufficient documentation

## 2014-11-30 DIAGNOSIS — M1712 Unilateral primary osteoarthritis, left knee: Secondary | ICD-10-CM | POA: Diagnosis not present

## 2014-11-30 LAB — TSH: TSH: 3.985 u[IU]/mL (ref 0.350–4.500)

## 2014-11-30 LAB — T3, FREE: T3, Free: 2.6 pg/mL (ref 2.3–4.2)

## 2014-11-30 LAB — T4, FREE: Free T4: 1.26 ng/dL (ref 0.80–1.80)

## 2014-11-30 MED ORDER — SULFAMETHOXAZOLE-TRIMETHOPRIM 800-160 MG PO TABS: 1.0000 | ORAL_TABLET | Freq: Two times a day (BID) | ORAL | Status: DC

## 2014-11-30 MED ORDER — OXYCODONE-ACETAMINOPHEN 5-325 MG PO TABS: 1.0000 | ORAL_TABLET | Freq: Four times a day (QID) | ORAL | Status: DC | PRN

## 2014-11-30 MED ORDER — POTASSIUM CHLORIDE CRYS ER 10 MEQ PO TBCR: EXTENDED_RELEASE_TABLET | ORAL | Status: DC

## 2014-11-30 MED ORDER — TORSEMIDE 20 MG PO TABS: 20.0000 mg | ORAL_TABLET | Freq: Every day | ORAL | Status: DC

## 2014-11-30 NOTE — Assessment & Plan Note (Signed)
Change to percocet, f/u with ortho for further recommendations

## 2014-11-30 NOTE — Assessment & Plan Note (Signed)
Recheck TFT on synthroid, titrate as needed

## 2014-11-30 NOTE — Assessment & Plan Note (Signed)
Increase demadex to 67m BID with the potassium Discussed low salt diet

## 2014-11-30 NOTE — Progress Notes (Signed)
Patient ID: Michael [name], male   DOB: [birth year], 69 y.o.   MRN: [id number]   Subjective:    Patient ID: Michael [name], male    DOB: 08-23-1946, 69 y.o.   MRN: [id number]  Patient presents for Leg Pain/ BLE Edema  patient here with bilateral lower extremity edema. He states that he started taking the [facility] last week and he has been starting to P but the swelling has not gone down all the way. He uses Meals [employer] Wheels and unfortunately often gets canned foods and things with a lot of salt in them. He is also not very mobile because of arthritis in his knees which is worsening. He requested a stronger pain medication as the hydrocodone is not helping. He is seeing his orthopedist next week. He denies any chest pain or shortness of breath with the leg swelling    Review Of Systems:  GEN- denies fatigue, fever, weight loss,weakness, recent illness HEENT- denies eye drainage, change in vision, nasal discharge, CVS- denies chest pain, palpitations RESP- denies SOB, cough, wheeze ABD- denies N/V, change in stools, abd pain GU- denies dysuria, hematuria, dribbling, incontinence MSK- denies joint pain, muscle aches, injury Neuro- denies headache, dizziness, syncope, seizure activity       Objective:    BP 146/88 mmHg  Pulse 80  Temp(Src) 98.8 F (37.1 C) (Oral)  Resp 18  Ht 4' 11"  (1.499 m)  Wt 207 lb (93.895 kg)  BMI 41.79 kg/m2 GEN- NAD, alert and oriented x3 HEENT- PERRL, EOMI, non injected sclera, pink conjunctiva, MMM, oropharynx clear Neck- Supple, no JVD CVS- RRR, no murmur RESP-CTAB Skin- erythema of gluteal  Right buttocks surrounding small quarter size scab right cheek,, no induration, no fluctuance, mild TTP EXT- 1+ pitting pedal L >R  edema Pulses- Radial 2+, DP- 1+        Assessment & Plan:      Problem List Items Addressed This Visit      Unprioritized   Peripheral edema   Osteoarthritis of left knee   Relevant Medications   OXYCODONE-acetaminophen   5-325 mg po tabs   Hypothyroidism - Primary   Relevant Orders   TSH (Completed)   T3, free (Completed)   T4, free (Completed)    Other Visit Diagnoses    Cellulitis of buttock        Treat with bactrim BID as not healed and appears a little worse today       Note: This dictation was prepared with Dragon dictation along with smaller phrase technology. Any transcriptional errors that result from this process are unintentional.

## 2014-11-30 NOTE — Patient Instructions (Signed)
Pain medication changed to percocet every 6 hours as needed Increase water pill Demadex to 67m twice a day, take with potassium for 1 week Hold the HCTZ while taking this Take antibiotic as prescribed We will call with lab results F/U as previous

## 2014-12-05 ENCOUNTER — Encounter: Payer: Self-pay | Admitting: *Deleted

## 2014-12-06 ENCOUNTER — Telehealth: Payer: Self-pay | Admitting: Family Medicine

## 2014-12-06 DIAGNOSIS — S83242A Other tear of medial meniscus, current injury, left knee, initial encounter: Secondary | ICD-10-CM | POA: Diagnosis not present

## 2014-12-06 DIAGNOSIS — S83241A Other tear of medial meniscus, current injury, right knee, initial encounter: Secondary | ICD-10-CM | POA: Diagnosis not present

## 2014-12-06 NOTE — Telephone Encounter (Signed)
Called Stacy back and she stated that pt had informed her that he had changed insurance company and wanted to be sure we have him with [facility] Medicare Complete and not [employer], pt does have [employer] complete so does not need a referral place to be seen.

## 2014-12-06 NOTE — Telephone Encounter (Signed)
Stacy from dr Penelope Coop office calling to get referral for appt he has today  Please call her at [phone]

## 2014-12-07 ENCOUNTER — Other Ambulatory Visit (HOSPITAL_COMMUNITY): Payer: Self-pay | Admitting: Orthopedic Surgery

## 2014-12-07 DIAGNOSIS — M25561 Pain in right knee: Secondary | ICD-10-CM

## 2014-12-14 ENCOUNTER — Other Ambulatory Visit: Payer: Self-pay | Admitting: Family Medicine

## 2014-12-14 NOTE — Telephone Encounter (Signed)
Call pt and see why he is requesting antibiotic refill

## 2014-12-14 NOTE — Telephone Encounter (Signed)
Ok to refill Bactrim DS?  Last ov 03/16

## 2014-12-15 ENCOUNTER — Ambulatory Visit (HOSPITAL_COMMUNITY)
Admission: RE | Admit: 2014-12-15 | Discharge: 2014-12-15 | Disposition: A | Discharge status: Home / Self Care | Payer: Medicare Other | Source: Ambulatory Visit | Attending: Orthopedic Surgery | Admitting: Orthopedic Surgery

## 2014-12-15 DIAGNOSIS — M25561 Pain in right knee: Secondary | ICD-10-CM

## 2014-12-20 DIAGNOSIS — M170 Bilateral primary osteoarthritis of knee: Secondary | ICD-10-CM | POA: Diagnosis not present

## 2014-12-20 NOTE — Telephone Encounter (Signed)
Call placed to patient. LMTRC.  

## 2014-12-20 NOTE — Telephone Encounter (Signed)
See why pt is requesting refill

## 2014-12-21 NOTE — Telephone Encounter (Signed)
Noted, will see on Friday, no further antibiotics

## 2014-12-21 NOTE — Telephone Encounter (Signed)
Patient returned call.   States that he completed ABTx for cellulitis to buttocks, but the open area has not resolved. States that there is some drainage remaining, but the drainage is clear in coloration. Reports that his friend who is a CNA looked at the area and states that area appeared to be pressure related.   Appointment scheduled for patient to come in on Friday for F/U to assess area.

## 2014-12-23 ENCOUNTER — Encounter: Payer: Self-pay | Admitting: Family Medicine

## 2014-12-23 ENCOUNTER — Ambulatory Visit (INDEPENDENT_AMBULATORY_CARE_PROVIDER_SITE_OTHER): Payer: Medicare Other | Admitting: Family Medicine

## 2014-12-23 VITALS — BP 138/70 | HR 76 | Temp 98.1°F | Resp 18 | Ht 59.0 in | Wt 207.0 lb

## 2014-12-23 DIAGNOSIS — L89312 Pressure ulcer of right buttock, stage 2: Secondary | ICD-10-CM | POA: Diagnosis not present

## 2014-12-23 DIAGNOSIS — B372 Candidiasis of skin and nail: Secondary | ICD-10-CM

## 2014-12-23 MED ORDER — OXYCODONE-ACETAMINOPHEN 5-325 MG PO TABS: 1.0000 | ORAL_TABLET | Freq: Four times a day (QID) | ORAL | Status: DC | PRN

## 2014-12-23 MED ORDER — NYSTATIN 100000 UNIT/GM EX CREA
1.0000 | TOPICAL_CREAM | Freq: Two times a day (BID) | CUTANEOUS | Status: DC
Start: 2014-12-23 — End: 2016-04-16

## 2014-12-23 NOTE — Patient Instructions (Signed)
Nurse to be set up Keep duoderm on No further antibiotics Use cream in crease of buttocks Try to switch positions  F/U as previous

## 2014-12-23 NOTE — Addendum Note (Signed)
Addended by: Vic Blackbird F on: 12/23/2014 02:00 PM   Modules accepted: Level of Service

## 2014-12-23 NOTE — Progress Notes (Signed)
Patient ID: Michael [name], male   DOB: September 08, 1946, 69 y.o.   MRN: [id number]   Subjective:    Patient ID: Michael [name], male    DOB: 1946/03/27, 69 y.o.   MRN: [id number]  Patient presents for Open Are to Buttocks  Patient here to follow-up open area to his buttocks. He complained of this back in January at that time he thought it made in due to a new shower seat he was sitting on his also found to have significant intertrigo in the gluteal cleft which I treated with topical antifungal this improved however the area persisted on his buttocks. He returned on March 2 at that time he had evidence of cellulitis with some drainage therefore put him on a course of Bactrim. He tends to sit most of the day because of his chronic knee pain and he also gets a lot of sweating in the area. He states that the area still open and a little sore and that it drains clear fluid and occasionally blood if he is rubbing it the wrong way. He states he has tried sitting on pillows to help take off some of the stress from the area. He is unable to visualize the area on his own   Review Of Systems:  GEN- denies fatigue, fever, weight loss,weakness, recent illness HEENT- denies eye drainage, change in vision, nasal discharge, CVS- denies chest pain, palpitations RESP- denies SOB, cough, wheeze ABD- denies N/V, change in stools, abd pain GU- denies dysuria, hematuria, dribbling, incontinence MSK-+ joint pain, muscle aches, injury Neuro- denies headache, dizziness, syncope, seizure activity       Objective:    BP 138/70 mmHg  Pulse 76  Temp(Src) 98.1 F (36.7 C) (Oral)  Resp 18  Ht 4' 11"  (1.499 m)  Wt 207 lb (93.895 kg)  BMI 41.79 kg/m2   GEN-NAD,alert and oriented x 3 Skin- mild erythema of gluteal cleft,  Right buttocks surrounding small nickle size scab right cheek,, no induration, no fluctuance, mild TTP, no draiange EXT- mild pedal L >R  edema Pulses- Radial 2+,         Assessment & Plan:      Problem List Items Addressed This Visit    None    Visit Diagnoses    Pressure sore on buttocks, left, stage II    -  Primary    Now more pressure sore with poor healing, diabetes fairly well controlled, no sign of infection but still has some yeast in cleft. Applied duoderm, will have [place] dress wound as pt unable to see it and has difficulty, high risk for further breakdown       Note: This dictation was prepared with Dragon dictation along with smaller phrase technology. Any transcriptional errors that result from this process are unintentional.

## 2014-12-24 DIAGNOSIS — G629 Polyneuropathy, unspecified: Secondary | ICD-10-CM | POA: Diagnosis not present

## 2014-12-24 DIAGNOSIS — I10 Essential (primary) hypertension: Secondary | ICD-10-CM | POA: Diagnosis not present

## 2014-12-24 DIAGNOSIS — E119 Type 2 diabetes mellitus without complications: Secondary | ICD-10-CM | POA: Diagnosis not present

## 2014-12-24 DIAGNOSIS — K7581 Nonalcoholic steatohepatitis (NASH): Secondary | ICD-10-CM | POA: Diagnosis not present

## 2014-12-24 DIAGNOSIS — M1712 Unilateral primary osteoarthritis, left knee: Secondary | ICD-10-CM | POA: Diagnosis not present

## 2014-12-24 DIAGNOSIS — E669 Obesity, unspecified: Secondary | ICD-10-CM | POA: Diagnosis not present

## 2014-12-24 DIAGNOSIS — Z794 Long term (current) use of insulin: Secondary | ICD-10-CM | POA: Diagnosis not present

## 2014-12-24 DIAGNOSIS — L89312 Pressure ulcer of right buttock, stage 2: Secondary | ICD-10-CM | POA: Diagnosis not present

## 2014-12-24 DIAGNOSIS — M4806 Spinal stenosis, lumbar region: Secondary | ICD-10-CM | POA: Diagnosis not present

## 2014-12-24 DIAGNOSIS — D509 Iron deficiency anemia, unspecified: Secondary | ICD-10-CM | POA: Diagnosis not present

## 2014-12-26 DIAGNOSIS — L89312 Pressure ulcer of right buttock, stage 2: Secondary | ICD-10-CM | POA: Diagnosis not present

## 2014-12-30 ENCOUNTER — Telehealth: Payer: Self-pay | Admitting: *Deleted

## 2014-12-30 DIAGNOSIS — E119 Type 2 diabetes mellitus without complications: Secondary | ICD-10-CM | POA: Diagnosis not present

## 2014-12-30 DIAGNOSIS — E669 Obesity, unspecified: Secondary | ICD-10-CM | POA: Diagnosis not present

## 2014-12-30 DIAGNOSIS — Z794 Long term (current) use of insulin: Secondary | ICD-10-CM | POA: Diagnosis not present

## 2014-12-30 DIAGNOSIS — G629 Polyneuropathy, unspecified: Secondary | ICD-10-CM | POA: Diagnosis not present

## 2014-12-30 DIAGNOSIS — M1712 Unilateral primary osteoarthritis, left knee: Secondary | ICD-10-CM | POA: Diagnosis not present

## 2014-12-30 DIAGNOSIS — K7581 Nonalcoholic steatohepatitis (NASH): Secondary | ICD-10-CM | POA: Diagnosis not present

## 2014-12-30 DIAGNOSIS — L89312 Pressure ulcer of right buttock, stage 2: Secondary | ICD-10-CM | POA: Diagnosis not present

## 2014-12-30 DIAGNOSIS — I10 Essential (primary) hypertension: Secondary | ICD-10-CM | POA: Diagnosis not present

## 2014-12-30 DIAGNOSIS — D509 Iron deficiency anemia, unspecified: Secondary | ICD-10-CM | POA: Diagnosis not present

## 2014-12-30 DIAGNOSIS — M4806 Spinal stenosis, lumbar region: Secondary | ICD-10-CM | POA: Diagnosis not present

## 2014-12-30 NOTE — Telephone Encounter (Signed)
okay

## 2014-12-30 NOTE — Telephone Encounter (Signed)
Received call from [facility] with [facility].   Requested verbal order for PT/OT/ SW to eval and tx as indicated for weakness and HH Adie. Verbal orders given.   Also states that patient glucometer was noted not working on 12/30/2014. Requested new orders to be called to [facility] for replacement glucometer. Prescription called to pharmacy.  MD to be made aware.

## 2015-01-03 ENCOUNTER — Telehealth: Payer: Self-pay | Admitting: Family Medicine

## 2015-01-03 DIAGNOSIS — E119 Type 2 diabetes mellitus without complications: Secondary | ICD-10-CM | POA: Diagnosis not present

## 2015-01-03 DIAGNOSIS — I10 Essential (primary) hypertension: Secondary | ICD-10-CM | POA: Diagnosis not present

## 2015-01-03 DIAGNOSIS — K7581 Nonalcoholic steatohepatitis (NASH): Secondary | ICD-10-CM | POA: Diagnosis not present

## 2015-01-03 DIAGNOSIS — E669 Obesity, unspecified: Secondary | ICD-10-CM | POA: Diagnosis not present

## 2015-01-03 DIAGNOSIS — L89312 Pressure ulcer of right buttock, stage 2: Secondary | ICD-10-CM

## 2015-01-03 DIAGNOSIS — L89152 Pressure ulcer of sacral region, stage 2: Secondary | ICD-10-CM

## 2015-01-03 DIAGNOSIS — M1712 Unilateral primary osteoarthritis, left knee: Secondary | ICD-10-CM | POA: Diagnosis not present

## 2015-01-03 DIAGNOSIS — G629 Polyneuropathy, unspecified: Secondary | ICD-10-CM | POA: Diagnosis not present

## 2015-01-03 DIAGNOSIS — Z794 Long term (current) use of insulin: Secondary | ICD-10-CM | POA: Diagnosis not present

## 2015-01-03 DIAGNOSIS — M4806 Spinal stenosis, lumbar region: Secondary | ICD-10-CM | POA: Diagnosis not present

## 2015-01-03 DIAGNOSIS — D509 Iron deficiency anemia, unspecified: Secondary | ICD-10-CM | POA: Diagnosis not present

## 2015-01-03 MED ORDER — UNABLE TO FIND
Status: DC
Start: 2015-01-03 — End: 2015-05-01

## 2015-01-03 NOTE — Telephone Encounter (Signed)
Okay to write orders i will sign

## 2015-01-03 NOTE — Telephone Encounter (Signed)
Rx for hosp bed to [facility]

## 2015-01-03 NOTE — Telephone Encounter (Signed)
Pt has Stage Two pressure sore.  Has been sleeping in recliner because can't get in and out of bed.  Please send written RX to [facility] for [facility] bed with 1/2 rails with gel overlay.  Fax # [phone].

## 2015-01-04 DIAGNOSIS — M4806 Spinal stenosis, lumbar region: Secondary | ICD-10-CM | POA: Diagnosis not present

## 2015-01-04 DIAGNOSIS — L89312 Pressure ulcer of right buttock, stage 2: Secondary | ICD-10-CM

## 2015-01-04 DIAGNOSIS — G629 Polyneuropathy, unspecified: Secondary | ICD-10-CM | POA: Diagnosis not present

## 2015-01-04 DIAGNOSIS — K7581 Nonalcoholic steatohepatitis (NASH): Secondary | ICD-10-CM | POA: Diagnosis not present

## 2015-01-04 DIAGNOSIS — E669 Obesity, unspecified: Secondary | ICD-10-CM | POA: Diagnosis not present

## 2015-01-04 DIAGNOSIS — Z794 Long term (current) use of insulin: Secondary | ICD-10-CM | POA: Diagnosis not present

## 2015-01-04 DIAGNOSIS — M1712 Unilateral primary osteoarthritis, left knee: Secondary | ICD-10-CM

## 2015-01-04 DIAGNOSIS — I10 Essential (primary) hypertension: Secondary | ICD-10-CM

## 2015-01-04 DIAGNOSIS — E119 Type 2 diabetes mellitus without complications: Secondary | ICD-10-CM

## 2015-01-04 DIAGNOSIS — D509 Iron deficiency anemia, unspecified: Secondary | ICD-10-CM | POA: Diagnosis not present

## 2015-01-04 NOTE — Progress Notes (Signed)
Addendum to note for Medicare DME: Pt requires Gel Overlay for bed, to help with skin breakdown and prevent worsening of sacral ulcers  Due to debility caused by his chronic knee pain, currently uses walker, getting up from typical recline and bed also difficult and he is not turning as needed. I recommend hospital bed to assist with positioning in bed to prevent worsening of current ulcer and prevention of new ulcers

## 2015-01-05 DIAGNOSIS — E119 Type 2 diabetes mellitus without complications: Secondary | ICD-10-CM | POA: Diagnosis not present

## 2015-01-05 DIAGNOSIS — Z794 Long term (current) use of insulin: Secondary | ICD-10-CM | POA: Diagnosis not present

## 2015-01-05 DIAGNOSIS — M1712 Unilateral primary osteoarthritis, left knee: Secondary | ICD-10-CM | POA: Diagnosis not present

## 2015-01-05 DIAGNOSIS — M4806 Spinal stenosis, lumbar region: Secondary | ICD-10-CM | POA: Diagnosis not present

## 2015-01-05 DIAGNOSIS — D509 Iron deficiency anemia, unspecified: Secondary | ICD-10-CM | POA: Diagnosis not present

## 2015-01-05 DIAGNOSIS — E669 Obesity, unspecified: Secondary | ICD-10-CM | POA: Diagnosis not present

## 2015-01-05 DIAGNOSIS — L89312 Pressure ulcer of right buttock, stage 2: Secondary | ICD-10-CM | POA: Diagnosis not present

## 2015-01-05 DIAGNOSIS — G629 Polyneuropathy, unspecified: Secondary | ICD-10-CM | POA: Diagnosis not present

## 2015-01-05 DIAGNOSIS — I10 Essential (primary) hypertension: Secondary | ICD-10-CM | POA: Diagnosis not present

## 2015-01-05 DIAGNOSIS — K7581 Nonalcoholic steatohepatitis (NASH): Secondary | ICD-10-CM | POA: Diagnosis not present

## 2015-01-06 DIAGNOSIS — E119 Type 2 diabetes mellitus without complications: Secondary | ICD-10-CM | POA: Diagnosis not present

## 2015-01-06 DIAGNOSIS — M1712 Unilateral primary osteoarthritis, left knee: Secondary | ICD-10-CM | POA: Diagnosis not present

## 2015-01-06 DIAGNOSIS — E669 Obesity, unspecified: Secondary | ICD-10-CM | POA: Diagnosis not present

## 2015-01-06 DIAGNOSIS — Z794 Long term (current) use of insulin: Secondary | ICD-10-CM | POA: Diagnosis not present

## 2015-01-06 DIAGNOSIS — K7581 Nonalcoholic steatohepatitis (NASH): Secondary | ICD-10-CM | POA: Diagnosis not present

## 2015-01-06 DIAGNOSIS — M4806 Spinal stenosis, lumbar region: Secondary | ICD-10-CM | POA: Diagnosis not present

## 2015-01-06 DIAGNOSIS — D509 Iron deficiency anemia, unspecified: Secondary | ICD-10-CM | POA: Diagnosis not present

## 2015-01-06 DIAGNOSIS — L89312 Pressure ulcer of right buttock, stage 2: Secondary | ICD-10-CM | POA: Diagnosis not present

## 2015-01-06 DIAGNOSIS — G629 Polyneuropathy, unspecified: Secondary | ICD-10-CM | POA: Diagnosis not present

## 2015-01-06 DIAGNOSIS — I10 Essential (primary) hypertension: Secondary | ICD-10-CM | POA: Diagnosis not present

## 2015-01-09 ENCOUNTER — Telehealth: Payer: Self-pay | Admitting: *Deleted

## 2015-01-09 ENCOUNTER — Other Ambulatory Visit: Payer: Self-pay | Admitting: Family Medicine

## 2015-01-09 DIAGNOSIS — M1712 Unilateral primary osteoarthritis, left knee: Secondary | ICD-10-CM | POA: Diagnosis not present

## 2015-01-09 DIAGNOSIS — M4806 Spinal stenosis, lumbar region: Secondary | ICD-10-CM | POA: Diagnosis not present

## 2015-01-09 DIAGNOSIS — K7581 Nonalcoholic steatohepatitis (NASH): Secondary | ICD-10-CM | POA: Diagnosis not present

## 2015-01-09 DIAGNOSIS — G629 Polyneuropathy, unspecified: Secondary | ICD-10-CM | POA: Diagnosis not present

## 2015-01-09 DIAGNOSIS — Z794 Long term (current) use of insulin: Secondary | ICD-10-CM | POA: Diagnosis not present

## 2015-01-09 DIAGNOSIS — D509 Iron deficiency anemia, unspecified: Secondary | ICD-10-CM | POA: Diagnosis not present

## 2015-01-09 DIAGNOSIS — E119 Type 2 diabetes mellitus without complications: Secondary | ICD-10-CM | POA: Diagnosis not present

## 2015-01-09 DIAGNOSIS — I10 Essential (primary) hypertension: Secondary | ICD-10-CM | POA: Diagnosis not present

## 2015-01-09 DIAGNOSIS — E669 Obesity, unspecified: Secondary | ICD-10-CM | POA: Diagnosis not present

## 2015-01-09 DIAGNOSIS — L89312 Pressure ulcer of right buttock, stage 2: Secondary | ICD-10-CM | POA: Diagnosis not present

## 2015-01-09 NOTE — Telephone Encounter (Signed)
Okay 

## 2015-01-09 NOTE — Telephone Encounter (Signed)
Refill appropriate and filled per protocol. 

## 2015-01-09 NOTE — Telephone Encounter (Signed)
Received a call from [place] from [facility] stating she is seeing this patient and needs verbal order for OT twice a week for 3 weeks and also for a [place] aide for 2 weeks to help him with some ADL's. I went ahead and gave the orders and told CAT that I will call her if anything changes.

## 2015-01-10 DIAGNOSIS — M4806 Spinal stenosis, lumbar region: Secondary | ICD-10-CM | POA: Diagnosis not present

## 2015-01-10 DIAGNOSIS — I10 Essential (primary) hypertension: Secondary | ICD-10-CM | POA: Diagnosis not present

## 2015-01-10 DIAGNOSIS — E669 Obesity, unspecified: Secondary | ICD-10-CM | POA: Diagnosis not present

## 2015-01-10 DIAGNOSIS — G629 Polyneuropathy, unspecified: Secondary | ICD-10-CM | POA: Diagnosis not present

## 2015-01-10 DIAGNOSIS — K7581 Nonalcoholic steatohepatitis (NASH): Secondary | ICD-10-CM | POA: Diagnosis not present

## 2015-01-10 DIAGNOSIS — D509 Iron deficiency anemia, unspecified: Secondary | ICD-10-CM | POA: Diagnosis not present

## 2015-01-10 DIAGNOSIS — E119 Type 2 diabetes mellitus without complications: Secondary | ICD-10-CM | POA: Diagnosis not present

## 2015-01-10 DIAGNOSIS — Z794 Long term (current) use of insulin: Secondary | ICD-10-CM | POA: Diagnosis not present

## 2015-01-10 DIAGNOSIS — L89312 Pressure ulcer of right buttock, stage 2: Secondary | ICD-10-CM | POA: Diagnosis not present

## 2015-01-10 DIAGNOSIS — M1712 Unilateral primary osteoarthritis, left knee: Secondary | ICD-10-CM | POA: Diagnosis not present

## 2015-01-11 DIAGNOSIS — E119 Type 2 diabetes mellitus without complications: Secondary | ICD-10-CM | POA: Diagnosis not present

## 2015-01-11 DIAGNOSIS — M1712 Unilateral primary osteoarthritis, left knee: Secondary | ICD-10-CM | POA: Diagnosis not present

## 2015-01-11 DIAGNOSIS — M4806 Spinal stenosis, lumbar region: Secondary | ICD-10-CM | POA: Diagnosis not present

## 2015-01-11 DIAGNOSIS — I10 Essential (primary) hypertension: Secondary | ICD-10-CM | POA: Diagnosis not present

## 2015-01-11 DIAGNOSIS — L89312 Pressure ulcer of right buttock, stage 2: Secondary | ICD-10-CM | POA: Diagnosis not present

## 2015-01-11 DIAGNOSIS — K7581 Nonalcoholic steatohepatitis (NASH): Secondary | ICD-10-CM | POA: Diagnosis not present

## 2015-01-11 DIAGNOSIS — Z794 Long term (current) use of insulin: Secondary | ICD-10-CM | POA: Diagnosis not present

## 2015-01-11 DIAGNOSIS — D509 Iron deficiency anemia, unspecified: Secondary | ICD-10-CM | POA: Diagnosis not present

## 2015-01-11 DIAGNOSIS — G629 Polyneuropathy, unspecified: Secondary | ICD-10-CM | POA: Diagnosis not present

## 2015-01-11 DIAGNOSIS — E669 Obesity, unspecified: Secondary | ICD-10-CM | POA: Diagnosis not present

## 2015-01-11 NOTE — Progress Notes (Signed)
Addendum:   Due to debility with OA knees, gait instability, he now has sacral decubitus ulcer which is worsening.  Pt requires frequent re-positioning of the body in ways that can not be achieved with a regular bed or pillow to eliminate pain and reduce pressure on areas of concern.  He also needs gel overlay due to known peripheral neuropathy that alters his sensory preception

## 2015-01-12 DIAGNOSIS — M1712 Unilateral primary osteoarthritis, left knee: Secondary | ICD-10-CM | POA: Diagnosis not present

## 2015-01-12 DIAGNOSIS — D509 Iron deficiency anemia, unspecified: Secondary | ICD-10-CM | POA: Diagnosis not present

## 2015-01-12 DIAGNOSIS — K7581 Nonalcoholic steatohepatitis (NASH): Secondary | ICD-10-CM | POA: Diagnosis not present

## 2015-01-12 DIAGNOSIS — G629 Polyneuropathy, unspecified: Secondary | ICD-10-CM | POA: Diagnosis not present

## 2015-01-12 DIAGNOSIS — I10 Essential (primary) hypertension: Secondary | ICD-10-CM | POA: Diagnosis not present

## 2015-01-12 DIAGNOSIS — E669 Obesity, unspecified: Secondary | ICD-10-CM | POA: Diagnosis not present

## 2015-01-12 DIAGNOSIS — Z794 Long term (current) use of insulin: Secondary | ICD-10-CM | POA: Diagnosis not present

## 2015-01-12 DIAGNOSIS — L89312 Pressure ulcer of right buttock, stage 2: Secondary | ICD-10-CM | POA: Diagnosis not present

## 2015-01-12 DIAGNOSIS — M4806 Spinal stenosis, lumbar region: Secondary | ICD-10-CM | POA: Diagnosis not present

## 2015-01-12 DIAGNOSIS — E119 Type 2 diabetes mellitus without complications: Secondary | ICD-10-CM | POA: Diagnosis not present

## 2015-01-13 DIAGNOSIS — G629 Polyneuropathy, unspecified: Secondary | ICD-10-CM | POA: Diagnosis not present

## 2015-01-13 DIAGNOSIS — Z794 Long term (current) use of insulin: Secondary | ICD-10-CM | POA: Diagnosis not present

## 2015-01-13 DIAGNOSIS — I10 Essential (primary) hypertension: Secondary | ICD-10-CM | POA: Diagnosis not present

## 2015-01-13 DIAGNOSIS — M4806 Spinal stenosis, lumbar region: Secondary | ICD-10-CM | POA: Diagnosis not present

## 2015-01-13 DIAGNOSIS — E669 Obesity, unspecified: Secondary | ICD-10-CM | POA: Diagnosis not present

## 2015-01-13 DIAGNOSIS — M1712 Unilateral primary osteoarthritis, left knee: Secondary | ICD-10-CM | POA: Diagnosis not present

## 2015-01-13 DIAGNOSIS — D509 Iron deficiency anemia, unspecified: Secondary | ICD-10-CM | POA: Diagnosis not present

## 2015-01-13 DIAGNOSIS — L89312 Pressure ulcer of right buttock, stage 2: Secondary | ICD-10-CM | POA: Diagnosis not present

## 2015-01-13 DIAGNOSIS — E119 Type 2 diabetes mellitus without complications: Secondary | ICD-10-CM | POA: Diagnosis not present

## 2015-01-13 DIAGNOSIS — K7581 Nonalcoholic steatohepatitis (NASH): Secondary | ICD-10-CM | POA: Diagnosis not present

## 2015-01-17 DIAGNOSIS — Z794 Long term (current) use of insulin: Secondary | ICD-10-CM | POA: Diagnosis not present

## 2015-01-17 DIAGNOSIS — M1712 Unilateral primary osteoarthritis, left knee: Secondary | ICD-10-CM | POA: Diagnosis not present

## 2015-01-17 DIAGNOSIS — E669 Obesity, unspecified: Secondary | ICD-10-CM | POA: Diagnosis not present

## 2015-01-17 DIAGNOSIS — K7581 Nonalcoholic steatohepatitis (NASH): Secondary | ICD-10-CM | POA: Diagnosis not present

## 2015-01-17 DIAGNOSIS — E119 Type 2 diabetes mellitus without complications: Secondary | ICD-10-CM | POA: Diagnosis not present

## 2015-01-17 DIAGNOSIS — G629 Polyneuropathy, unspecified: Secondary | ICD-10-CM | POA: Diagnosis not present

## 2015-01-17 DIAGNOSIS — I10 Essential (primary) hypertension: Secondary | ICD-10-CM | POA: Diagnosis not present

## 2015-01-17 DIAGNOSIS — L89312 Pressure ulcer of right buttock, stage 2: Secondary | ICD-10-CM | POA: Diagnosis not present

## 2015-01-17 DIAGNOSIS — D509 Iron deficiency anemia, unspecified: Secondary | ICD-10-CM | POA: Diagnosis not present

## 2015-01-17 DIAGNOSIS — M4806 Spinal stenosis, lumbar region: Secondary | ICD-10-CM | POA: Diagnosis not present

## 2015-01-18 ENCOUNTER — Encounter: Payer: Self-pay | Admitting: Family Medicine

## 2015-01-18 ENCOUNTER — Ambulatory Visit (INDEPENDENT_AMBULATORY_CARE_PROVIDER_SITE_OTHER): Payer: Medicare Other | Admitting: Family Medicine

## 2015-01-18 ENCOUNTER — Ambulatory Visit: Payer: Medicare Other | Admitting: Family Medicine

## 2015-01-18 VITALS — BP 136/78 | HR 76 | Temp 98.6°F | Resp 16 | Ht 59.0 in | Wt 208.0 lb

## 2015-01-18 DIAGNOSIS — D229 Melanocytic nevi, unspecified: Secondary | ICD-10-CM

## 2015-01-18 DIAGNOSIS — Z0000 Encounter for general adult medical examination without abnormal findings: Secondary | ICD-10-CM | POA: Diagnosis not present

## 2015-01-18 DIAGNOSIS — E1149 Type 2 diabetes mellitus with other diabetic neurological complication: Secondary | ICD-10-CM

## 2015-01-18 DIAGNOSIS — D239 Other benign neoplasm of skin, unspecified: Secondary | ICD-10-CM | POA: Diagnosis not present

## 2015-01-18 DIAGNOSIS — I10 Essential (primary) hypertension: Secondary | ICD-10-CM | POA: Diagnosis not present

## 2015-01-18 DIAGNOSIS — E785 Hyperlipidemia, unspecified: Secondary | ICD-10-CM | POA: Diagnosis not present

## 2015-01-18 DIAGNOSIS — E1140 Type 2 diabetes mellitus with diabetic neuropathy, unspecified: Secondary | ICD-10-CM

## 2015-01-18 LAB — CBC WITH DIFFERENTIAL/PLATELET
Basophils Absolute: 0.1 10*3/uL (ref 0.0–0.1)
Basophils Relative: 2 % — ABNORMAL HIGH (ref 0–1)
EOS PCT: 1 % (ref 0–5)
Eosinophils Absolute: 0.1 10*3/uL (ref 0.0–0.7)
HEMATOCRIT: 33.2 % — AB (ref 39.0–52.0)
Hemoglobin: 10.6 g/dL — ABNORMAL LOW (ref 13.0–17.0)
Lymphocytes Relative: 22 % (ref 12–46)
Lymphs Abs: 1.3 10*3/uL (ref 0.7–4.0)
MCH: 22.7 pg — AB (ref 26.0–34.0)
MCHC: 31.9 g/dL (ref 30.0–36.0)
MCV: 71.2 fL — ABNORMAL LOW (ref 78.0–100.0)
MONO ABS: 0.5 10*3/uL (ref 0.1–1.0)
MPV: 8.9 fL (ref 8.6–12.4)
Monocytes Relative: 8 % (ref 3–12)
NEUTROS ABS: 3.8 10*3/uL (ref 1.7–7.7)
Neutrophils Relative %: 67 % (ref 43–77)
PLATELETS: 272 10*3/uL (ref 150–400)
RBC: 4.66 MIL/uL (ref 4.22–5.81)
RDW: 16.6 % — AB (ref 11.5–15.5)
WBC: 5.7 10*3/uL (ref 4.0–10.5)

## 2015-01-18 LAB — COMPREHENSIVE METABOLIC PANEL
ALBUMIN: 4.1 g/dL (ref 3.5–5.2)
ALT: 46 U/L (ref 0–53)
AST: 33 U/L (ref 0–37)
Alkaline Phosphatase: 59 U/L (ref 39–117)
BUN: 9 mg/dL (ref 6–23)
CALCIUM: 9.3 mg/dL (ref 8.4–10.5)
CO2: 22 mEq/L (ref 19–32)
Chloride: 96 mEq/L (ref 96–112)
Creat: 0.59 mg/dL (ref 0.50–1.35)
Glucose, Bld: 92 mg/dL (ref 70–99)
POTASSIUM: 4.1 meq/L (ref 3.5–5.3)
Sodium: 133 mEq/L — ABNORMAL LOW (ref 135–145)
Total Bilirubin: 0.4 mg/dL (ref 0.2–1.2)
Total Protein: 6.3 g/dL (ref 6.0–8.3)

## 2015-01-18 LAB — MICROALBUMIN / CREATININE URINE RATIO
CREATININE, URINE: 48 mg/dL
MICROALB/CREAT RATIO: 45.8 mg/g — AB (ref 0.0–30.0)
Microalb, Ur: 2.2 mg/dL — ABNORMAL HIGH (ref <2.0)

## 2015-01-18 LAB — HEMOGLOBIN A1C
Hgb A1c MFr Bld: 6.4 % — ABNORMAL HIGH (ref <5.7)
Mean Plasma Glucose: 137 mg/dL — ABNORMAL HIGH (ref <117)

## 2015-01-18 MED ORDER — OXYCODONE-ACETAMINOPHEN 5-325 MG PO TABS: 1.0000 | ORAL_TABLET | Freq: Four times a day (QID) | ORAL | Status: DC | PRN

## 2015-01-18 NOTE — Progress Notes (Signed)
Patient ID: Michael [name], male   DOB: 09-26-1946, 69 y.o.   MRN: [id number] Subjective:   Patient presents for Medicare Annual/Subsequent preventive examination.    Pt here for CPE. No particular concerns. He needs a letter stating he has never had CVA, on some hospital record this was found by his life insurance company, I have no record of any CVA.   DM- CBG range 70-100 fasting, 1 episode of hypoglycemia, resolved with eating, he is taking meds as prescribed. No change in his weight, and his mobility has declined.  Currently being followed by [facility] for sacral decub which is healing nicely. Also getting PT/OT   He has mole on left cheek that has been increasing in size Review Past Medical/Family/Social: Per EMR   Risk Factors  Current exercise habits: None Dietary issues discussed: Yes  Cardiac risk factors: Obesity (BMI >= 30 kg/m2). DM, HTN  Depression Screen  (Note: if answer to either of the following is "Yes", a more complete depression screening is indicated)  Over the past two weeks, have you felt down, depressed or hopeless? Yes Over the past two weeks, have you felt little interest or pleasure in doing things? Yes Have you lost interest or pleasure in daily life? No Do you often feel hopeless? No Do you cry easily over simple problems? No   Activities of Daily Living  In your present state of health, do you have any difficulty performing the following activities?:  Driving? yes Managing money? No  Feeding yourself? No  Getting from bed to chair? No  Climbing a flight of stairs? yes  Preparing food and eating?: No  Bathing or showering? No  Getting dressed: No  Getting to the toilet? No  Using the toilet:No  Moving around from place to place: yes In the past year have you fallen or had a near fall?:No  Are you sexually active? No  Do you have more than one partner? No   Hearing Difficulties: No  Do you often ask people to speak up or repeat themselves? No  Do you  experience ringing or noises in your ears? No Do you have difficulty understanding soft or whispered voices? No  Do you feel that you have a problem with memory? No Do you often misplace items? No  Do you feel safe at home? Yes  Cognitive Testing  Alert? Yes Normal Appearance?Yes  Oriented to person? Yes Place? Yes  Time? Yes  Recall of three objects? Yes  Can perform simple calculations? Yes  Displays appropriate judgment?Yes  Can read the correct time from a watch face?Yes   List the Names of Other Physician/Practitioners you currently use: [facility], orthopedics, HH, GI    Screening Tests / Date Colonoscopy    - UTD                 Zostavax - UTD Influenza Vaccine -UTD Tetanus/tdap- unable due fiances  ROS: GEN- denies fatigue, fever, weight loss,weakness, recent illness HEENT- denies eye drainage, change in vision, nasal discharge, CVS- denies chest pain, palpitations RESP- denies SOB, cough, wheeze ABD- denies N/V, change in stools, abd pain GU- denies dysuria, hematuria, dribbling, incontinence MSK-+ joint pain, muscle aches, injury Neuro- denies headache, dizziness, syncope, seizure activity  PHYSICAL: GEN- NAD, alert and oriented x3,walks with walker HEENT- PERRL, EOMI, non injected sclera, pink conjunctiva, MMM, oropharynx clear Neck- Supple,  CVS- RRR, no murmur ABD-NABS,soft,NT,ND RESP-CTAB Skin- erythematous mole on lower left lid, hyperpigmented pedunculated Nevus lesion on left cheek/ multiple  SK on arms, back, moles on neck EXT- 1+ pedal edema Pulses- Radial 2+, DP- 1+    Assessment:    Annual wellness medicare exam   Plan:    During the course of the visit the patient was educated and counseled about appropriate screening and preventive services including:  TDAP- will be done as needed for any injury, unable to afford at this time .  Screen + for depression. PHQ- 9 score of 12, his chronic knee pain, decreased mobility has lead to his low mood, he  does not need meds at this time, will work on daily activities he enjoys to keep his mood uplifted and monitor for now.   Diet review for nutrition referral? Yes ____ Not Indicated __x__  Patient Instructions (the written plan) was given to the patient.  Medicare Attestation  I have personally reviewed:  The patient's medical and social history  Their use of alcohol, tobacco or illicit drugs  Their current medications and supplements  The patient's functional ability including ADLs,fall risks, home safety risks, cognitive, and hearing and visual impairment  Diet and physical activities  Evidence for depression or mood disorders  The patient's weight, height, BMI, and visual acuity have been recorded in the chart. I have made referrals, counseling, and provided education to the patient based on review of the above and I have provided the patient with a written personalized care plan for preventive services.

## 2015-01-18 NOTE — Assessment & Plan Note (Signed)
cholesteorl at goal, continue statin drug

## 2015-01-18 NOTE — Patient Instructions (Signed)
We will call with lab results Continue all other medication Pain medication as prescribed  Schedule Eye doctor appointment Referral to Dermatology for mole removal F/U 4 months

## 2015-01-18 NOTE — Assessment & Plan Note (Signed)
Blood pressure, looks good today off HCTZ, continue norvasc and ACEI

## 2015-01-18 NOTE — Assessment & Plan Note (Addendum)
Recheck A1C, will also recheck sodium he had mild hyponatremia at last visit.  He diabetes has been fairly well controlled He will set up eye doctor appointment Followed by podiatry

## 2015-01-18 NOTE — Assessment & Plan Note (Signed)
Mole on face enlarging in size, needs removal Also has lesion on lower eyelid that needs evaluation Referral to dermatology

## 2015-01-19 DIAGNOSIS — G629 Polyneuropathy, unspecified: Secondary | ICD-10-CM | POA: Diagnosis not present

## 2015-01-19 DIAGNOSIS — L89312 Pressure ulcer of right buttock, stage 2: Secondary | ICD-10-CM | POA: Diagnosis not present

## 2015-01-19 DIAGNOSIS — E119 Type 2 diabetes mellitus without complications: Secondary | ICD-10-CM | POA: Diagnosis not present

## 2015-01-19 DIAGNOSIS — D509 Iron deficiency anemia, unspecified: Secondary | ICD-10-CM | POA: Diagnosis not present

## 2015-01-19 DIAGNOSIS — I10 Essential (primary) hypertension: Secondary | ICD-10-CM | POA: Diagnosis not present

## 2015-01-19 DIAGNOSIS — M4806 Spinal stenosis, lumbar region: Secondary | ICD-10-CM | POA: Diagnosis not present

## 2015-01-19 DIAGNOSIS — K7581 Nonalcoholic steatohepatitis (NASH): Secondary | ICD-10-CM | POA: Diagnosis not present

## 2015-01-19 DIAGNOSIS — E669 Obesity, unspecified: Secondary | ICD-10-CM | POA: Diagnosis not present

## 2015-01-19 DIAGNOSIS — M1712 Unilateral primary osteoarthritis, left knee: Secondary | ICD-10-CM | POA: Diagnosis not present

## 2015-01-19 DIAGNOSIS — Z794 Long term (current) use of insulin: Secondary | ICD-10-CM | POA: Diagnosis not present

## 2015-01-20 DIAGNOSIS — Z794 Long term (current) use of insulin: Secondary | ICD-10-CM | POA: Diagnosis not present

## 2015-01-20 DIAGNOSIS — E119 Type 2 diabetes mellitus without complications: Secondary | ICD-10-CM | POA: Diagnosis not present

## 2015-01-20 DIAGNOSIS — L89312 Pressure ulcer of right buttock, stage 2: Secondary | ICD-10-CM | POA: Diagnosis not present

## 2015-01-20 DIAGNOSIS — I10 Essential (primary) hypertension: Secondary | ICD-10-CM | POA: Diagnosis not present

## 2015-01-20 DIAGNOSIS — K7581 Nonalcoholic steatohepatitis (NASH): Secondary | ICD-10-CM | POA: Diagnosis not present

## 2015-01-20 DIAGNOSIS — D509 Iron deficiency anemia, unspecified: Secondary | ICD-10-CM | POA: Diagnosis not present

## 2015-01-20 DIAGNOSIS — E669 Obesity, unspecified: Secondary | ICD-10-CM | POA: Diagnosis not present

## 2015-01-20 DIAGNOSIS — M4806 Spinal stenosis, lumbar region: Secondary | ICD-10-CM | POA: Diagnosis not present

## 2015-01-20 DIAGNOSIS — M1712 Unilateral primary osteoarthritis, left knee: Secondary | ICD-10-CM | POA: Diagnosis not present

## 2015-01-20 DIAGNOSIS — G629 Polyneuropathy, unspecified: Secondary | ICD-10-CM | POA: Diagnosis not present

## 2015-01-24 DIAGNOSIS — K7581 Nonalcoholic steatohepatitis (NASH): Secondary | ICD-10-CM | POA: Diagnosis not present

## 2015-01-24 DIAGNOSIS — Z794 Long term (current) use of insulin: Secondary | ICD-10-CM | POA: Diagnosis not present

## 2015-01-24 DIAGNOSIS — M1712 Unilateral primary osteoarthritis, left knee: Secondary | ICD-10-CM | POA: Diagnosis not present

## 2015-01-24 DIAGNOSIS — E119 Type 2 diabetes mellitus without complications: Secondary | ICD-10-CM | POA: Diagnosis not present

## 2015-01-24 DIAGNOSIS — D509 Iron deficiency anemia, unspecified: Secondary | ICD-10-CM | POA: Diagnosis not present

## 2015-01-24 DIAGNOSIS — L89312 Pressure ulcer of right buttock, stage 2: Secondary | ICD-10-CM | POA: Diagnosis not present

## 2015-01-24 DIAGNOSIS — G629 Polyneuropathy, unspecified: Secondary | ICD-10-CM | POA: Diagnosis not present

## 2015-01-24 DIAGNOSIS — M4806 Spinal stenosis, lumbar region: Secondary | ICD-10-CM | POA: Diagnosis not present

## 2015-01-24 DIAGNOSIS — I10 Essential (primary) hypertension: Secondary | ICD-10-CM | POA: Diagnosis not present

## 2015-01-24 DIAGNOSIS — E669 Obesity, unspecified: Secondary | ICD-10-CM | POA: Diagnosis not present

## 2015-01-25 DIAGNOSIS — D509 Iron deficiency anemia, unspecified: Secondary | ICD-10-CM | POA: Diagnosis not present

## 2015-01-25 DIAGNOSIS — E119 Type 2 diabetes mellitus without complications: Secondary | ICD-10-CM | POA: Diagnosis not present

## 2015-01-25 DIAGNOSIS — L89312 Pressure ulcer of right buttock, stage 2: Secondary | ICD-10-CM | POA: Diagnosis not present

## 2015-01-25 DIAGNOSIS — K7581 Nonalcoholic steatohepatitis (NASH): Secondary | ICD-10-CM | POA: Diagnosis not present

## 2015-01-25 DIAGNOSIS — G629 Polyneuropathy, unspecified: Secondary | ICD-10-CM | POA: Diagnosis not present

## 2015-01-25 DIAGNOSIS — E669 Obesity, unspecified: Secondary | ICD-10-CM | POA: Diagnosis not present

## 2015-01-25 DIAGNOSIS — M1712 Unilateral primary osteoarthritis, left knee: Secondary | ICD-10-CM | POA: Diagnosis not present

## 2015-01-25 DIAGNOSIS — M4806 Spinal stenosis, lumbar region: Secondary | ICD-10-CM | POA: Diagnosis not present

## 2015-01-25 DIAGNOSIS — I10 Essential (primary) hypertension: Secondary | ICD-10-CM | POA: Diagnosis not present

## 2015-01-25 DIAGNOSIS — Z794 Long term (current) use of insulin: Secondary | ICD-10-CM | POA: Diagnosis not present

## 2015-01-26 DIAGNOSIS — M1712 Unilateral primary osteoarthritis, left knee: Secondary | ICD-10-CM | POA: Diagnosis not present

## 2015-01-26 DIAGNOSIS — I10 Essential (primary) hypertension: Secondary | ICD-10-CM | POA: Diagnosis not present

## 2015-01-26 DIAGNOSIS — M4806 Spinal stenosis, lumbar region: Secondary | ICD-10-CM | POA: Diagnosis not present

## 2015-01-26 DIAGNOSIS — Z794 Long term (current) use of insulin: Secondary | ICD-10-CM | POA: Diagnosis not present

## 2015-01-26 DIAGNOSIS — L89312 Pressure ulcer of right buttock, stage 2: Secondary | ICD-10-CM | POA: Diagnosis not present

## 2015-01-26 DIAGNOSIS — K7581 Nonalcoholic steatohepatitis (NASH): Secondary | ICD-10-CM | POA: Diagnosis not present

## 2015-01-26 DIAGNOSIS — E119 Type 2 diabetes mellitus without complications: Secondary | ICD-10-CM | POA: Diagnosis not present

## 2015-01-26 DIAGNOSIS — L918 Other hypertrophic disorders of the skin: Secondary | ICD-10-CM | POA: Diagnosis not present

## 2015-01-26 DIAGNOSIS — L820 Inflamed seborrheic keratosis: Secondary | ICD-10-CM | POA: Diagnosis not present

## 2015-01-26 DIAGNOSIS — E669 Obesity, unspecified: Secondary | ICD-10-CM | POA: Diagnosis not present

## 2015-01-26 DIAGNOSIS — G629 Polyneuropathy, unspecified: Secondary | ICD-10-CM | POA: Diagnosis not present

## 2015-01-26 DIAGNOSIS — D225 Melanocytic nevi of trunk: Secondary | ICD-10-CM | POA: Diagnosis not present

## 2015-01-26 DIAGNOSIS — D509 Iron deficiency anemia, unspecified: Secondary | ICD-10-CM | POA: Diagnosis not present

## 2015-02-04 DIAGNOSIS — L89312 Pressure ulcer of right buttock, stage 2: Secondary | ICD-10-CM | POA: Diagnosis not present

## 2015-02-07 ENCOUNTER — Encounter: Payer: Self-pay | Admitting: Orthopedic Surgery

## 2015-02-07 ENCOUNTER — Other Ambulatory Visit: Payer: Self-pay | Admitting: Orthopedic Surgery

## 2015-02-07 ENCOUNTER — Ambulatory Visit (HOSPITAL_COMMUNITY)
Admission: RE | Admit: 2015-02-07 | Discharge: 2015-02-07 | Disposition: A | Discharge status: Home / Self Care | Payer: Medicare Other | Source: Ambulatory Visit | Attending: Orthopedic Surgery | Admitting: Orthopedic Surgery

## 2015-02-07 ENCOUNTER — Ambulatory Visit (INDEPENDENT_AMBULATORY_CARE_PROVIDER_SITE_OTHER): Payer: Medicare Other | Admitting: Orthopedic Surgery

## 2015-02-07 VITALS — BP 169/93 | Ht 59.0 in | Wt 208.0 lb

## 2015-02-07 DIAGNOSIS — M25562 Pain in left knee: Secondary | ICD-10-CM | POA: Diagnosis not present

## 2015-02-07 DIAGNOSIS — M1712 Unilateral primary osteoarthritis, left knee: Secondary | ICD-10-CM

## 2015-02-07 DIAGNOSIS — M5137 Other intervertebral disc degeneration, lumbosacral region: Secondary | ICD-10-CM

## 2015-02-07 DIAGNOSIS — Z9889 Other specified postprocedural states: Secondary | ICD-10-CM

## 2015-02-07 DIAGNOSIS — M7652 Patellar tendinitis, left knee: Secondary | ICD-10-CM | POA: Diagnosis not present

## 2015-02-07 NOTE — Progress Notes (Signed)
Patient ID: Michael [name], male   DOB: 1946/03/31, 69 y.o.   MRN: [id number]  Chief Complaint  Patient presents with  . Follow-up    recurring left knee pain, last seen 11/30/13, salk mm 11/06/12    Mr. Mendenhall has had knee arthroscopy in 2014 at a torn medial meniscus and grade 2 chondral changes of the medial femoral condyle presents now with no new complaints of injury but increasing left knee pain over the medial joint line and somewhat globally around the knee. He was treated with hyaluronic acid injection 3 in the left knee with no relief this was done in [place]  He had an MRI after his meniscal surgery in June 2015 and it showed no new tear.  He's had back surgery since I saw him by Dr. Luiz Ochoa for bulging disks. He presents now for evaluation of his increasing knee pain on the left  We do note on review of systems he developed skin problem with a pressure ulcer, he has bilateral pitting edema which is severe he is under treatment with torsemide. He has weakness in his legs and sometimes has bilateral leg pain    BP 169/93 mmHg  Ht 4' 11"  (1.499 m)  Wt 208 lb (94.348 kg)  BMI 41.99 kg/m2 he is ambulatory with a walker. He has trouble getting out of the chair with weakness in his proximal thigh musculature. His body habitus is mesomorphic is oriented 3 his mood is normal he is ambulatory  He has bilateral pitting edema skin discoloration from fluid overload or venous stasis disease pulses are intact  Medial joint line of the left knee is tender right knee has not  His knee flexion is 115 flexion. His knee does not quite come to full extension but is stable  I think we need to start with a new plain film weightbearing of possible to see if his arthritis has progressed  We also need to work on the edema in his legs because if he needs surgery he has too much edema at this point to do anything surgically. He also has possibility of a new pressure sore which needs to be addressed  prior to any surgery  Diagnosis osteoarthritis of the knee lumbar degenerative disc disease chronic pain Encounter Diagnoses  Name Primary?  . Status post arthroscopy of left knee Yes  . Primary osteoarthritis of left knee   . Patellar tendonitis, left   . DDD (degenerative disc disease), lumbosacral    Follow-up in one day with x-rays

## 2015-02-08 ENCOUNTER — Encounter: Payer: Self-pay | Admitting: Orthopedic Surgery

## 2015-02-08 ENCOUNTER — Ambulatory Visit (INDEPENDENT_AMBULATORY_CARE_PROVIDER_SITE_OTHER): Payer: Medicare Other | Admitting: Orthopedic Surgery

## 2015-02-08 VITALS — BP 153/88 | Ht 59.0 in | Wt 214.0 lb

## 2015-02-08 DIAGNOSIS — M48061 Spinal stenosis, lumbar region without neurogenic claudication: Secondary | ICD-10-CM

## 2015-02-08 DIAGNOSIS — Z9889 Other specified postprocedural states: Secondary | ICD-10-CM

## 2015-02-08 DIAGNOSIS — M4806 Spinal stenosis, lumbar region: Secondary | ICD-10-CM | POA: Diagnosis not present

## 2015-02-08 DIAGNOSIS — M5137 Other intervertebral disc degeneration, lumbosacral region: Secondary | ICD-10-CM | POA: Diagnosis not present

## 2015-02-08 DIAGNOSIS — M1712 Unilateral primary osteoarthritis, left knee: Secondary | ICD-10-CM

## 2015-02-08 NOTE — Patient Instructions (Signed)
You have decided to proceed with knee replacement surgery. You have decided not to continue with nonoperative measures such as but not limited to oral medication, weight loss, activity modification, physical therapy, bracing, or injection.  We will perform the procedure commonly known as total knee replacement. Some of the risks associated with knee replacement surgery include but are not limited to Bleeding Infection Swelling Stiffness Blood clot Pain that persists even after surgery  Infection is especially devastating complication of knee surgery although rare. If infection does occur your implant will usually have to be removed and several surgeries will be needed to eradicate the infection prior to performing a repeat replacement.   If you're not comfortable with these risks and would like to continue with nonoperative treatment please let Dr. Aline Brochure know prior to your surgery.

## 2015-02-08 NOTE — Progress Notes (Signed)
Patient ID: Michael [name], male   DOB: 1945-10-20, 68 y.o.   MRN: [id number] Chief Complaint  Patient presents with  . Follow-up    follow up left knee    Mr. Godeaux x-rays were done at the [facility] diagnostic center yesterday as our x-ray equipment was down. He complains of aggressive left knee pain proximal weakness in his legs status post lumbar discectomy and fusion. Presents with medial knee pain which is increasing  His x-ray shows that his joint spaces narrowed significantly has a varus tendency towards alignment in the knee. I did retest his extensor mechanism and it is intact  His major problem right now is peripheral edema. I will have to get his medical doctor involved to try to get the fluid off of his legs as this places him at increased risk of blood clot because he has venous stasis disease and wound problems  Second problem is he had a pressure ulcer which he thinks might be opening back up and that needs to be cleared before he can have surgery  We discussed the surgery today and I'll see him in a month just like to keep track of what's going on when we can pull the trigger on the surgery

## 2015-02-13 ENCOUNTER — Ambulatory Visit (INDEPENDENT_AMBULATORY_CARE_PROVIDER_SITE_OTHER): Payer: Medicare Other | Admitting: Family Medicine

## 2015-02-13 ENCOUNTER — Encounter: Payer: Self-pay | Admitting: Family Medicine

## 2015-02-13 ENCOUNTER — Telehealth: Payer: Self-pay | Admitting: Family Medicine

## 2015-02-13 VITALS — BP 140/82 | HR 90 | Temp 98.4°F | Resp 18 | Wt 212.0 lb

## 2015-02-13 DIAGNOSIS — M1712 Unilateral primary osteoarthritis, left knee: Secondary | ICD-10-CM | POA: Diagnosis not present

## 2015-02-13 DIAGNOSIS — R609 Edema, unspecified: Secondary | ICD-10-CM | POA: Diagnosis not present

## 2015-02-13 DIAGNOSIS — L89152 Pressure ulcer of sacral region, stage 2: Secondary | ICD-10-CM | POA: Diagnosis not present

## 2015-02-13 MED ORDER — OXYCODONE-ACETAMINOPHEN 7.5-325 MG PO TABS: 1.0000 | ORAL_TABLET | Freq: Three times a day (TID) | ORAL | Status: DC | PRN

## 2015-02-13 MED ORDER — TORSEMIDE 20 MG PO TABS: ORAL_TABLET | ORAL | Status: DC

## 2015-02-13 MED ORDER — POTASSIUM CHLORIDE CRYS ER 10 MEQ PO TBCR: EXTENDED_RELEASE_TABLET | ORAL | Status: DC

## 2015-02-13 NOTE — Telephone Encounter (Signed)
Called pt back and he stated that he never received the lasix that was supposed to have been sent to phaarmacy. I informed pt that all the meds that Dr. Buelah Manis was sent in and that his pharmacy shuld have delivered the fluid pill aso and call them and see what the problem was.

## 2015-02-13 NOTE — Assessment & Plan Note (Signed)
Increase demadex to 53m in AM and 263min PM with potassium Recheck in 2 weeks, needs BMET then as well

## 2015-02-13 NOTE — Telephone Encounter (Signed)
[phone] PT has called and left a VM stating that Dr Buelah Manis was going to call him in a fluid pill but when [place] delivered it it was a potassium pill and he already has that one.

## 2015-02-13 NOTE — Progress Notes (Signed)
Patient ID: Michael [name], male   DOB: 01-Jun-1946, 69 y.o.   MRN: [id number]   Subjective:    Patient ID: Michael [name], male    DOB: 1946-03-21, 69 y.o.   MRN: [id number]  Patient presents for discuss knee surgery; Pressure Ulcer; and Medication Refill  patient here secondary to worsening leg swelling. He was seen by his orthopedic surgeon who would like to replace his left knee however he had bilateral swelling as well as the return of his sacral decubitus ulcer. His pain is also not controlled and they will not prescribe medications until after his surgery. He has been taken his diuretic 20 mg twice a day that he typically takes no morning and the second dose around noon. He states that his legs will go down some but as he is up throughout the day they swell again. Because he has been trying to elevate his legs some he thinks that the sacral ulcer has started again. He is no longer getting care from skilled nursing.     Review Of Systems:  GEN- denies fatigue, fever, weight loss,weakness, recent illness HEENT- denies eye drainage, change in vision, nasal discharge, CVS- denies chest pain, palpitations RESP- denies SOB, cough, wheeze ABD- denies N/V, change in stools, abd pain GU- denies dysuria, hematuria, dribbling, incontinence MSK-+ joint pain, muscle aches, injury Neuro- denies headache, dizziness, syncope, seizure activity       Objective:    BP 140/82 mmHg  Pulse 90  Temp(Src) 98.4 F (36.9 C) (Oral)  Resp 18  Wt 212 lb (96.163 kg) GEN- NAD, alert and oriented x3,walks with walker HEENT- PERRL, EOMI, non injected sclera, pink conjunctiva, MMM, oropharynx clear CVS- RRR, no murmur RESP-CTAB ABD-NABS,soft,NT,ND EXT- 1+ edema Skin- stage II ulceration -same as previous right buttocks, mild erythema of gluteal cleft Pulses- Radial, DP- 1+        Assessment & Plan:      Problem List Items Addressed This Visit    Stage II pressure ulcer of sacral region    Referral  for [place] to assist again, he has hospital bed, discussed not sitting all day and chagning positions He has limited mobility therefore sits due to his severe OA of knees      Peripheral edema    Increase demadex to 28m in AM and 229min PM with potassium Recheck in 2 weeks, needs BMET then as well      Osteoarthritis of left knee - Primary    Change pain meds to perococet 7.58m58mID #90 given      Relevant Medications   oxyCODONE-acetaminophen (PERCOCET) 7.5-325 MG per tablet      Note: This dictation was prepared with Dragon dictation along with smaller phrase technology. Any transcriptional errors that result from this process are unintentional.

## 2015-02-13 NOTE — Assessment & Plan Note (Signed)
Referral for [facility] to assist again, he has hospital bed, discussed not sitting all day and chagning positions He has limited mobility therefore sits due to his severe OA of knees

## 2015-02-13 NOTE — Assessment & Plan Note (Signed)
Change pain meds to perococet 7.46m TID #90 given

## 2015-02-13 NOTE — Patient Instructions (Signed)
Pain medication made stronger Nurse for sacral ulcer Increase demadex to 2 in morning and 1 in evening at 5pm, take with potassium F/U in 2 weeks

## 2015-02-16 DIAGNOSIS — Z794 Long term (current) use of insulin: Secondary | ICD-10-CM | POA: Diagnosis not present

## 2015-02-16 DIAGNOSIS — L89152 Pressure ulcer of sacral region, stage 2: Secondary | ICD-10-CM | POA: Diagnosis not present

## 2015-02-16 DIAGNOSIS — I872 Venous insufficiency (chronic) (peripheral): Secondary | ICD-10-CM | POA: Diagnosis not present

## 2015-02-16 DIAGNOSIS — K7581 Nonalcoholic steatohepatitis (NASH): Secondary | ICD-10-CM | POA: Diagnosis not present

## 2015-02-16 DIAGNOSIS — M1712 Unilateral primary osteoarthritis, left knee: Secondary | ICD-10-CM | POA: Diagnosis not present

## 2015-02-16 DIAGNOSIS — E1140 Type 2 diabetes mellitus with diabetic neuropathy, unspecified: Secondary | ICD-10-CM | POA: Diagnosis not present

## 2015-02-16 DIAGNOSIS — M25561 Pain in right knee: Secondary | ICD-10-CM | POA: Diagnosis not present

## 2015-02-17 ENCOUNTER — Telehealth: Payer: Self-pay | Admitting: *Deleted

## 2015-02-17 NOTE — Telephone Encounter (Signed)
Received a call from [place] at [facility] stating she has received prescription for compression stockings with Measurement of 20-30 strength and stated it was off the charts. Measurements cant fit him and may need custom speciality fit.  If questions please call Sharee Pimple back at [phone]  See had faxed me the prescription to to me to review. I placed it on your desk Margreta Journey

## 2015-02-17 NOTE — Telephone Encounter (Signed)
Call placed to [facility].   States that patient measurements are unique.   Reports that ankle measurements are large where calf measurements are smaller.   States that no hose in any compression strength will accommodate unique measurements.   Advised to discard order.

## 2015-02-17 NOTE — Telephone Encounter (Signed)
If they are custom why can't they fit him?, do they have any other type of compression sock or Ted hose

## 2015-02-20 NOTE — Telephone Encounter (Signed)
Due to patient ankle width, no compression hose will be able to fit calves correctly.   HH is able to perform ACE wraps with compression and teach patient how to apply.   Verbal order given.

## 2015-02-21 DIAGNOSIS — E1140 Type 2 diabetes mellitus with diabetic neuropathy, unspecified: Secondary | ICD-10-CM | POA: Diagnosis not present

## 2015-02-21 DIAGNOSIS — Z794 Long term (current) use of insulin: Secondary | ICD-10-CM | POA: Diagnosis not present

## 2015-02-21 DIAGNOSIS — M25561 Pain in right knee: Secondary | ICD-10-CM | POA: Diagnosis not present

## 2015-02-21 DIAGNOSIS — I872 Venous insufficiency (chronic) (peripheral): Secondary | ICD-10-CM | POA: Diagnosis not present

## 2015-02-21 DIAGNOSIS — M1712 Unilateral primary osteoarthritis, left knee: Secondary | ICD-10-CM | POA: Diagnosis not present

## 2015-02-21 DIAGNOSIS — K7581 Nonalcoholic steatohepatitis (NASH): Secondary | ICD-10-CM | POA: Diagnosis not present

## 2015-02-21 DIAGNOSIS — L89152 Pressure ulcer of sacral region, stage 2: Secondary | ICD-10-CM | POA: Diagnosis not present

## 2015-02-22 ENCOUNTER — Other Ambulatory Visit: Payer: Self-pay | Admitting: Family Medicine

## 2015-02-22 NOTE — Telephone Encounter (Signed)
Medication refilled per protocol. 

## 2015-02-23 DIAGNOSIS — E1140 Type 2 diabetes mellitus with diabetic neuropathy, unspecified: Secondary | ICD-10-CM | POA: Diagnosis not present

## 2015-02-23 DIAGNOSIS — M1712 Unilateral primary osteoarthritis, left knee: Secondary | ICD-10-CM | POA: Diagnosis not present

## 2015-02-23 DIAGNOSIS — K7581 Nonalcoholic steatohepatitis (NASH): Secondary | ICD-10-CM | POA: Diagnosis not present

## 2015-02-23 DIAGNOSIS — I872 Venous insufficiency (chronic) (peripheral): Secondary | ICD-10-CM | POA: Diagnosis not present

## 2015-02-23 DIAGNOSIS — L89152 Pressure ulcer of sacral region, stage 2: Secondary | ICD-10-CM | POA: Diagnosis not present

## 2015-02-23 DIAGNOSIS — Z794 Long term (current) use of insulin: Secondary | ICD-10-CM | POA: Diagnosis not present

## 2015-02-23 DIAGNOSIS — M25561 Pain in right knee: Secondary | ICD-10-CM | POA: Diagnosis not present

## 2015-02-27 DIAGNOSIS — Z794 Long term (current) use of insulin: Secondary | ICD-10-CM | POA: Diagnosis not present

## 2015-02-27 DIAGNOSIS — K7581 Nonalcoholic steatohepatitis (NASH): Secondary | ICD-10-CM | POA: Diagnosis not present

## 2015-02-27 DIAGNOSIS — I872 Venous insufficiency (chronic) (peripheral): Secondary | ICD-10-CM | POA: Diagnosis not present

## 2015-02-27 DIAGNOSIS — M25561 Pain in right knee: Secondary | ICD-10-CM | POA: Diagnosis not present

## 2015-02-27 DIAGNOSIS — L89152 Pressure ulcer of sacral region, stage 2: Secondary | ICD-10-CM | POA: Diagnosis not present

## 2015-02-27 DIAGNOSIS — M1712 Unilateral primary osteoarthritis, left knee: Secondary | ICD-10-CM | POA: Diagnosis not present

## 2015-02-27 DIAGNOSIS — E1140 Type 2 diabetes mellitus with diabetic neuropathy, unspecified: Secondary | ICD-10-CM | POA: Diagnosis not present

## 2015-02-28 ENCOUNTER — Telehealth: Payer: Self-pay | Admitting: Family Medicine

## 2015-02-28 ENCOUNTER — Ambulatory Visit: Payer: Medicare Other | Admitting: Family Medicine

## 2015-02-28 DIAGNOSIS — L89152 Pressure ulcer of sacral region, stage 2: Secondary | ICD-10-CM | POA: Diagnosis not present

## 2015-02-28 NOTE — Telephone Encounter (Signed)
[phone] Juliann Pulse a nurse from [facility] called and left a message on the VM regarding the pt and his legs swelling

## 2015-02-28 NOTE — Telephone Encounter (Signed)
Returned call to [place], [facility] SN with [facility].   Reports that patient was seen on 02/27/2015. States that he has not been able to reach legs to do ACE wraps himself, and has no teachable help in home. Reports that Pro4 wraps were begun, but patient states that he cannot tolerate.   Per Juliann Pulse, patient stated that wraps were too tight and caused severe itching. Juliann Pulse states that there is some petechiae noted on L lower extremity, but no blisters.  Patient refused to allow application of Pro4 wraps on 02/27/2015.  Juliann Pulse states that [facility], PT has 1 visit remaining. States that she feels that patient would benefit from going to [facility] for edema, and patient can try Pro4 Lite wraps.   Patient has F/U appointment on 03/01/2015.  MD to be made aware.

## 2015-03-01 ENCOUNTER — Ambulatory Visit (INDEPENDENT_AMBULATORY_CARE_PROVIDER_SITE_OTHER): Payer: Medicare Other | Admitting: Family Medicine

## 2015-03-01 VITALS — BP 138/78 | HR 98 | Temp 98.4°F | Resp 18

## 2015-03-01 DIAGNOSIS — R609 Edema, unspecified: Secondary | ICD-10-CM | POA: Diagnosis not present

## 2015-03-01 DIAGNOSIS — L8991 Pressure ulcer of unspecified site, stage 1: Secondary | ICD-10-CM | POA: Diagnosis not present

## 2015-03-01 DIAGNOSIS — I10 Essential (primary) hypertension: Secondary | ICD-10-CM

## 2015-03-01 LAB — BASIC METABOLIC PANEL
BUN: 9 mg/dL (ref 6–23)
CO2: 23 mEq/L (ref 19–32)
CREATININE: 0.7 mg/dL (ref 0.50–1.35)
Calcium: 9.3 mg/dL (ref 8.4–10.5)
Chloride: 96 mEq/L (ref 96–112)
Glucose, Bld: 194 mg/dL — ABNORMAL HIGH (ref 70–99)
Potassium: 4.4 mEq/L (ref 3.5–5.3)
SODIUM: 134 meq/L — AB (ref 135–145)

## 2015-03-01 MED ORDER — OXYCODONE-ACETAMINOPHEN 7.5-325 MG PO TABS: 1.0000 | ORAL_TABLET | Freq: Three times a day (TID) | ORAL | Status: DC | PRN

## 2015-03-01 NOTE — Patient Instructions (Signed)
Compression wraps Buttocks is improved We will call with lab results F/U as previous

## 2015-03-02 ENCOUNTER — Encounter: Payer: Self-pay | Admitting: Family Medicine

## 2015-03-02 DIAGNOSIS — E1140 Type 2 diabetes mellitus with diabetic neuropathy, unspecified: Secondary | ICD-10-CM | POA: Diagnosis not present

## 2015-03-02 DIAGNOSIS — M25561 Pain in right knee: Secondary | ICD-10-CM | POA: Diagnosis not present

## 2015-03-02 DIAGNOSIS — L89152 Pressure ulcer of sacral region, stage 2: Secondary | ICD-10-CM | POA: Diagnosis not present

## 2015-03-02 DIAGNOSIS — K7581 Nonalcoholic steatohepatitis (NASH): Secondary | ICD-10-CM | POA: Diagnosis not present

## 2015-03-02 DIAGNOSIS — Z794 Long term (current) use of insulin: Secondary | ICD-10-CM | POA: Diagnosis not present

## 2015-03-02 DIAGNOSIS — M1712 Unilateral primary osteoarthritis, left knee: Secondary | ICD-10-CM | POA: Diagnosis not present

## 2015-03-02 DIAGNOSIS — I872 Venous insufficiency (chronic) (peripheral): Secondary | ICD-10-CM | POA: Diagnosis not present

## 2015-03-02 NOTE — Progress Notes (Signed)
Patient ID: OLUWASEYI RAFFEL, male   DOB: 08-07-1946, 69 y.o.   MRN: [id number]   Subjective:    Patient ID: CAINEN BURNHAM, male    DOB: 19-Sep-1946, 69 y.o.   MRN: [id number]  Patient presents for 2 week F/U  Pt here for interim follow up on sacral decub and leg swelling. Has been on torsemide 6m in AM and 263min PM, states he urinates 20 times a day. Elevates legs, HH has  Tried a compression wraps but he was not able to tolerate, they want a new order for ProLite wraps. In general though leg swelling is much improved, he states in the AM his legs are normal.  HH also caring for recurrent stage II sacral decub  No new concerns today     Review Of Systems:  GEN- denies fatigue, fever, weight loss,weakness, recent illness HEENT- denies eye drainage, change in vision, nasal discharge, CVS- denies chest pain, palpitations RESP- denies SOB, cough, wheeze ABD- denies N/V, change in stools, abd pain GU- denies dysuria, hematuria, dribbling, incontinence MSK- +joint pain, muscle aches, injury Neuro- denies headache, dizziness, syncope, seizure activity       Objective:    BP 138/78 mmHg  Pulse 98  Temp(Src) 98.4 F (36.9 C) (Oral)  Resp 18 GEN- NAD, alert and oriented x3,walks with walker CVS- RRR, no murmur RESP-CTAB EXT- 1+ edema Skin- stage I sacral decub -right buttocks, mild erythema of gluteal cleft Pulses- Radial, DP- 1+       Assessment & Plan:      Problem List Items Addressed This Visit    Peripheral edema - Primary   Relevant Orders   Basic metabolic panel (Completed)   Essential hypertension      Note: This dictation was prepared with Dragon dictation along with smaller phrase technology. Any transcriptional errors that result from this process are unintentional.

## 2015-03-02 NOTE — Assessment & Plan Note (Signed)
BMET done today, I do see improvement in leg swelling today He would benefit from the wraps HH to apply Pro-Lite so he can tolerate Continue elevating feet

## 2015-03-02 NOTE — Assessment & Plan Note (Signed)
BP tolerating Diuretic

## 2015-03-02 NOTE — Assessment & Plan Note (Signed)
Improved pressure sore, will continue to dress for next week or 2, then this should resolve as long as he tries to move around and not sit all day

## 2015-03-07 DIAGNOSIS — I872 Venous insufficiency (chronic) (peripheral): Secondary | ICD-10-CM | POA: Diagnosis not present

## 2015-03-07 DIAGNOSIS — M25561 Pain in right knee: Secondary | ICD-10-CM | POA: Diagnosis not present

## 2015-03-07 DIAGNOSIS — M1712 Unilateral primary osteoarthritis, left knee: Secondary | ICD-10-CM | POA: Diagnosis not present

## 2015-03-07 DIAGNOSIS — Z794 Long term (current) use of insulin: Secondary | ICD-10-CM | POA: Diagnosis not present

## 2015-03-07 DIAGNOSIS — L89152 Pressure ulcer of sacral region, stage 2: Secondary | ICD-10-CM | POA: Diagnosis not present

## 2015-03-07 DIAGNOSIS — L89312 Pressure ulcer of right buttock, stage 2: Secondary | ICD-10-CM | POA: Diagnosis not present

## 2015-03-07 DIAGNOSIS — K7581 Nonalcoholic steatohepatitis (NASH): Secondary | ICD-10-CM | POA: Diagnosis not present

## 2015-03-07 DIAGNOSIS — E1140 Type 2 diabetes mellitus with diabetic neuropathy, unspecified: Secondary | ICD-10-CM | POA: Diagnosis not present

## 2015-03-13 ENCOUNTER — Ambulatory Visit (INDEPENDENT_AMBULATORY_CARE_PROVIDER_SITE_OTHER): Payer: Medicare Other | Admitting: Orthopedic Surgery

## 2015-03-13 ENCOUNTER — Encounter: Payer: Self-pay | Admitting: Orthopedic Surgery

## 2015-03-13 ENCOUNTER — Other Ambulatory Visit: Payer: Self-pay | Admitting: Family Medicine

## 2015-03-13 VITALS — BP 159/85 | Ht 59.0 in | Wt 209.0 lb

## 2015-03-13 DIAGNOSIS — M48061 Spinal stenosis, lumbar region without neurogenic claudication: Secondary | ICD-10-CM

## 2015-03-13 DIAGNOSIS — M1712 Unilateral primary osteoarthritis, left knee: Secondary | ICD-10-CM

## 2015-03-13 DIAGNOSIS — M4806 Spinal stenosis, lumbar region: Secondary | ICD-10-CM | POA: Diagnosis not present

## 2015-03-13 NOTE — Progress Notes (Signed)
Chief Complaint  Patient presents with  . Follow-up    1 month recheck on left knee and discuss surgery.    The patient comes in with 2 major issues preventing him from getting knee replacement wine he had severe peripheral edema and that has been treated with torsemide and compression wraps  He also had a stage II decubitus sacral ulcer which I reviewed and looked at today and it has healed  Mr. Rosero is somewhat in denial that his spinal stenosis is contributing to his dysfunction at this point. He walks with a walker walks with his back severely flexed. He had a laminectomy by Dr. Trenton Gammon and Dr. Luiz Ochoa back in 2014 recovered well but doesn't seem to understand that this is causing some of his problem with walking.  Review of systems he denies shortness of breath he denies chest pain he does have the peripheral edema he has left knee pain no back pain  BP 159/85 mmHg  Ht 4' 11"  (1.499 m)  Wt 209 lb (94.802 kg)  BMI 42.19 kg/m2 Medium build with obesity which is truncal. Mood is pleasant gait is supported by a walker with a severely flexed posture in his lumbar sacral area. He has tenderness around the patella medial and lateral joint line no effusion knee flexion approximately 115 mild flexion contracture knee is stable his extension strength is normal skin is intact over his left knee and sacral decubitus has healed since her exam is normal distally has peripheral edema bilaterally which is pitting much improved from his last visit  X-rays show arthritis of his knee his knee scope showed arthritis severe  He presents for left total knee arthroplasty  Risks and benefits of the surgery have been explained I have specifically reviewed the following related to his lumbar spine #1 he has spinal stenosis and that is creating some of his giving way symptoms in his left knee #2 he can get left knee pain from spinal stenosis #3 he has severe peripheral edema which may worsen after surgery.

## 2015-03-13 NOTE — Telephone Encounter (Signed)
Refill appropriate and filled per protocol. 

## 2015-03-13 NOTE — Patient Instructions (Signed)
SURGERY 04/05/15 LEFT TOTAL KNEE REPLACEMENT  You have decided to proceed with knee replacement surgery. You have decided not to continue with nonoperative measures such as but not limited to oral medication, weight loss, activity modification, physical therapy, bracing, or injection.  We will perform the procedure commonly known as total knee replacement. Some of the risks associated with knee replacement surgery include but are not limited to Bleeding Infection Swelling Stiffness Blood clot Pain that persists even after surgery  Infection is especially devastating complication of knee surgery although rare. If infection does occur your implant will usually have to be removed and several surgeries will be needed to eradicate the infection prior to performing a repeat replacement.   If you're not comfortable with these risks and would like to continue with nonoperative treatment please let Dr. Aline Brochure know prior to your surgery.   Total Knee Replacement  Total knee replacement is a surgery to replace your damaged knee joint. Your knee joint is replaced with a man-made (artificial) knee joint. The man-made knee joint is called a prosthesis. This surgery is done to reduce pain. It is also done to allow you to be more active. BEFORE THE PROCEDURE   Do not eat or drink anything after midnight on the night before the procedure or as told by your doctor.  Ask your doctor if you need to change or stop any medicines. This is important if you take diabetes medicines or blood thinners. PROCEDURE   You will be given medicine that makes you relax (sedative). This will be given through an IV tube.  Then you will be given one of the following:  A medicine that numbs your body from the waist down (spinal anesthetic).  A medicine that makes you fall asleep (general anesthetic).  A medicine to block feeling in your leg (nerve block).  A cut (incision) will be made in the front of your  knee.  Damaged parts of your knee joint will be taken out.  A new metal liner will be put over the part of the thigh bone that is taken out.  A plastic liner will be put over the shin bone.  A plastic piece will often be put over the surface of your knee cap. AFTER THE PROCEDURE   You will be taken to the recovery area.  You may have tubes to drain fluid from your knee.  Once you are doing okay, you will be taken to your hospital room.  You will do some exercises (physical therapy) at the hospital.  Your doctor may want you to spend some time in an extended-care facility. They can help you begin walking again before you go home.  Your doctor may have you take medicine to thin your blood. This helps to prevent blood clots in your leg. Document Released: 12/09/2011 Document Revised: 01/31/2014 Document Reviewed: 12/09/2011 [facility] Patient Information 2015 [place], [place]. This information is not intended to replace advice given to you by your health care provider. Make sure you discuss any questions you have with your health care provider.

## 2015-03-14 ENCOUNTER — Telehealth: Payer: Self-pay | Admitting: Orthopedic Surgery

## 2015-03-14 NOTE — Telephone Encounter (Addendum)
Regarding in-patient/admit surgery scheduled at [facility] for 04/05/15*Update: Surgery Date changed to 04/07/15, for CPT 27447, ICD-10 M17.12, Z98.89, contacted insurer [employer].  Per Shirlean Mylar T, 03/14/15, 5:36p.m, received "Pending" Authorization# [id number]; states we will be notified regarding faxing of clinicals. Spoke with Maurine Cane, 03/15/15 regarding the procedure date change; awaiting faxed request for clinicals from Utilization review department.

## 2015-03-15 ENCOUNTER — Other Ambulatory Visit: Payer: Self-pay | Admitting: *Deleted

## 2015-03-15 DIAGNOSIS — M1712 Unilateral primary osteoarthritis, left knee: Secondary | ICD-10-CM | POA: Diagnosis not present

## 2015-03-15 DIAGNOSIS — M25561 Pain in right knee: Secondary | ICD-10-CM | POA: Diagnosis not present

## 2015-03-15 DIAGNOSIS — Z794 Long term (current) use of insulin: Secondary | ICD-10-CM | POA: Diagnosis not present

## 2015-03-15 DIAGNOSIS — L89152 Pressure ulcer of sacral region, stage 2: Secondary | ICD-10-CM | POA: Diagnosis not present

## 2015-03-15 DIAGNOSIS — K7581 Nonalcoholic steatohepatitis (NASH): Secondary | ICD-10-CM | POA: Diagnosis not present

## 2015-03-15 DIAGNOSIS — E1140 Type 2 diabetes mellitus with diabetic neuropathy, unspecified: Secondary | ICD-10-CM | POA: Diagnosis not present

## 2015-03-15 DIAGNOSIS — I872 Venous insufficiency (chronic) (peripheral): Secondary | ICD-10-CM | POA: Diagnosis not present

## 2015-03-17 ENCOUNTER — Telehealth: Payer: Self-pay | Admitting: *Deleted

## 2015-03-17 NOTE — Telephone Encounter (Signed)
03/16/15 Faxed clinicals as per [employer] request to Fax # [phone]. Status pending.

## 2015-03-17 NOTE — Telephone Encounter (Signed)
noted 

## 2015-03-17 NOTE — Telephone Encounter (Signed)
Received call from [place], [place] with [facility].   Reports that she has gotten approval from company to continue services x3 weeks for edema. States that patient is scheduled for surgery in 2 weeks.   Requested verbal order to continue services x3 weeks.   VO given.

## 2015-03-20 DIAGNOSIS — L89152 Pressure ulcer of sacral region, stage 2: Secondary | ICD-10-CM | POA: Diagnosis not present

## 2015-03-21 DIAGNOSIS — M25561 Pain in right knee: Secondary | ICD-10-CM | POA: Diagnosis not present

## 2015-03-21 DIAGNOSIS — L89152 Pressure ulcer of sacral region, stage 2: Secondary | ICD-10-CM | POA: Diagnosis not present

## 2015-03-21 DIAGNOSIS — E1140 Type 2 diabetes mellitus with diabetic neuropathy, unspecified: Secondary | ICD-10-CM | POA: Diagnosis not present

## 2015-03-21 DIAGNOSIS — Z794 Long term (current) use of insulin: Secondary | ICD-10-CM | POA: Diagnosis not present

## 2015-03-21 DIAGNOSIS — K7581 Nonalcoholic steatohepatitis (NASH): Secondary | ICD-10-CM | POA: Diagnosis not present

## 2015-03-21 DIAGNOSIS — I872 Venous insufficiency (chronic) (peripheral): Secondary | ICD-10-CM | POA: Diagnosis not present

## 2015-03-21 DIAGNOSIS — M1712 Unilateral primary osteoarthritis, left knee: Secondary | ICD-10-CM | POA: Diagnosis not present

## 2015-03-24 ENCOUNTER — Other Ambulatory Visit: Payer: Self-pay | Admitting: Family Medicine

## 2015-03-24 NOTE — Telephone Encounter (Signed)
Medication refilled per protocol. 

## 2015-03-28 DIAGNOSIS — M1712 Unilateral primary osteoarthritis, left knee: Secondary | ICD-10-CM | POA: Diagnosis not present

## 2015-03-28 DIAGNOSIS — Z794 Long term (current) use of insulin: Secondary | ICD-10-CM | POA: Diagnosis not present

## 2015-03-28 DIAGNOSIS — K7581 Nonalcoholic steatohepatitis (NASH): Secondary | ICD-10-CM | POA: Diagnosis not present

## 2015-03-28 DIAGNOSIS — E1140 Type 2 diabetes mellitus with diabetic neuropathy, unspecified: Secondary | ICD-10-CM | POA: Diagnosis not present

## 2015-03-28 DIAGNOSIS — I872 Venous insufficiency (chronic) (peripheral): Secondary | ICD-10-CM | POA: Diagnosis not present

## 2015-03-28 DIAGNOSIS — M25561 Pain in right knee: Secondary | ICD-10-CM | POA: Diagnosis not present

## 2015-03-28 DIAGNOSIS — L89152 Pressure ulcer of sacral region, stage 2: Secondary | ICD-10-CM | POA: Diagnosis not present

## 2015-04-04 ENCOUNTER — Other Ambulatory Visit: Payer: Self-pay | Admitting: *Deleted

## 2015-04-04 ENCOUNTER — Telehealth: Payer: Self-pay | Admitting: Family Medicine

## 2015-04-04 ENCOUNTER — Encounter (HOSPITAL_COMMUNITY)
Admission: RE | Admit: 2015-04-04 | Discharge: 2015-04-04 | Disposition: A | Discharge status: Home / Self Care | Payer: Medicare Other | Source: Ambulatory Visit | Attending: Orthopedic Surgery | Admitting: Orthopedic Surgery

## 2015-04-04 ENCOUNTER — Encounter (HOSPITAL_COMMUNITY): Payer: Self-pay

## 2015-04-04 ENCOUNTER — Other Ambulatory Visit: Payer: Self-pay

## 2015-04-04 DIAGNOSIS — Z01818 Encounter for other preprocedural examination: Secondary | ICD-10-CM | POA: Diagnosis not present

## 2015-04-04 LAB — CBC WITH DIFFERENTIAL/PLATELET
Basophils Absolute: 0.1 10*3/uL (ref 0.0–0.1)
Basophils Relative: 2 % — ABNORMAL HIGH (ref 0–1)
Eosinophils Absolute: 0.1 10*3/uL (ref 0.0–0.7)
Eosinophils Relative: 3 % (ref 0–5)
HCT: 31.9 % — ABNORMAL LOW (ref 39.0–52.0)
HEMOGLOBIN: 9.7 g/dL — AB (ref 13.0–17.0)
LYMPHS PCT: 28 % (ref 12–46)
Lymphs Abs: 1.3 10*3/uL (ref 0.7–4.0)
MCH: 22.5 pg — ABNORMAL LOW (ref 26.0–34.0)
MCHC: 30.4 g/dL (ref 30.0–36.0)
MCV: 73.8 fL — AB (ref 78.0–100.0)
Monocytes Absolute: 0.3 10*3/uL (ref 0.1–1.0)
Monocytes Relative: 6 % (ref 3–12)
NEUTROS PCT: 62 % (ref 43–77)
Neutro Abs: 3 10*3/uL (ref 1.7–7.7)
Platelets: 234 10*3/uL (ref 150–400)
RBC: 4.32 MIL/uL (ref 4.22–5.81)
RDW: 16.6 % — ABNORMAL HIGH (ref 11.5–15.5)
WBC: 4.8 10*3/uL (ref 4.0–10.5)

## 2015-04-04 LAB — ABO/RH: ABO/RH(D): A POS

## 2015-04-04 LAB — BASIC METABOLIC PANEL
ANION GAP: 10 (ref 5–15)
BUN: 11 mg/dL (ref 6–20)
CALCIUM: 9 mg/dL (ref 8.9–10.3)
CHLORIDE: 101 mmol/L (ref 101–111)
CO2: 25 mmol/L (ref 22–32)
Creatinine, Ser: 0.62 mg/dL (ref 0.61–1.24)
GFR calc Af Amer: >60 mL/min (ref >60)
GFR calc non Af Amer: >60 mL/min (ref >60)
Glucose, Bld: 147 mg/dL — ABNORMAL HIGH (ref 65–99)
Potassium: 3.9 mmol/L (ref 3.5–5.1)
Sodium: 136 mmol/L (ref 135–145)

## 2015-04-04 LAB — SURGICAL PCR SCREEN
MRSA, PCR: NEGATIVE
STAPHYLOCOCCUS AUREUS: NEGATIVE

## 2015-04-04 LAB — APTT: APTT: 26 s (ref 24–37)

## 2015-04-04 LAB — PROTIME-INR
INR: 1.11 (ref 0.00–1.49)
Prothrombin Time: 14.5 seconds (ref 11.6–15.2)

## 2015-04-04 NOTE — Telephone Encounter (Signed)
Okay to refill, call and see what came up at his Pre-op

## 2015-04-04 NOTE — Telephone Encounter (Signed)
[phone] Pt called and is wanting a refill on oxyCODONE-acetaminophen (PERCOCET) 7.5-325 MG per tablet (he states that he was suppose to have surgery Friday but due to something that came up in his pre op today he is not going to be able to have the surgery)

## 2015-04-04 NOTE — Telephone Encounter (Signed)
Ok to refill??  Last office visit/ refill 03/01/2015.

## 2015-04-04 NOTE — Patient Instructions (Signed)
Michael [name]  04/04/2015     @PREFPERIOPPHARMACY @   Your procedure is scheduled on  04/07/2015  Report to [facility] at  615  A.M.  Call this number if you have problems the morning of surgery:  [phone]   Remember:  Do not eat food or drink liquids after midnight.  Take these medicines the morning of surgery with A SIP OF WATER  Amlodipine, benazepril, neurontin, levothyroxine, mobic, prilosec, oxycodone.   Do not wear jewelry, make-up or nail polish.  Do not wear lotions, powders, or perfumes.   Do not shave 48 hours prior to surgery.  Men may shave face and neck.  Do not bring valuables to the hospital.  [facility] is not responsible for any belongings or valuables.  Contacts, dentures or bridgework may not be worn into surgery.  Leave your suitcase in the car.  After surgery it may be brought to your room.  For patients admitted to the hospital, discharge time will be determined by your treatment team.  Patients discharged the day of surgery will not be allowed to drive home.   Name and phone number of your driver:   family Special instructions:  none  Please read over the following fact sheets that you were given. Pain Booklet, Coughing and Deep Breathing, Surgical Site Infection Prevention, Anesthesia Post-op Instructions and Care and Recovery After Surgery      Total Knee Replacement Total knee replacement is a procedure to replace your knee joint with an artificial knee joint (prosthetic knee joint). The purpose of this surgery is to reduce pain and improve your knee function. LET [facility] CARE PROVIDER KNOW ABOUT:   Any allergies you have.  All medicines you are taking, including vitamins, herbs, eye drops, creams, and over-the-counter medicines.  Previous problems you or members of your family have had with the use of anesthetics.  Any blood disorders you have.  Previous surgeries you have had.  Medical conditions you have. RISKS AND  COMPLICATIONS  Generally, total knee replacement is a safe procedure. However, problems can occur and include:  Loss of range of motion of the knee or instability.  Loosening of the prosthesis.  Infection.  Persistent pain. BEFORE THE PROCEDURE   Do not eat or drink anything after midnight on the night before the procedure or as directed by your health care provider.  Ask your health care provider about changing or stopping your regular medicines. This is especially important if you are taking diabetes medicines or blood thinners. PROCEDURE   Just before the procedure, you will receive medicine that will make you drowsy (sedative). This will be given through a tube that is inserted into one of your veins (IV tube).  Then you will be given one of the following:  A medicine injected into your spine that numbs your body below the waist (spinal anesthetic).  A medicine that makes you fall asleep (general anesthetic).  You may also receive medicine to block feeling in your leg (nerve block) to help ease pain after surgery.  An incision will be made in your knee. Your surgeon will take out any damaged cartilage and bone by sawing off the damaged surfaces.  The surgeon will then put a new metal liner over the sawed-off portion of your thigh bone (femur) and a plastic liner over the sawed-off portion of one of the bones of your lower leg (tibia). This is to restore alignment and function to your knee. A  plastic piece is often used to restore the surface of your knee cap. AFTER THE PROCEDURE   You will be taken to the recovery area.  You may have drainage tubes to drain excess fluid from your knee. These tubes attach to a device that removes these fluids.  Once you are awake, stable, and taking fluids well, you will be taken to your hospital room.  You will receive physical therapy as prescribed by your health care provider.  Your surgeon may recommend that you spend time (usually an  additional 10-14 days) in an extended-care facility to help you begin walking again and improve your range of motion before you go home.  You may also be prescribed blood-thinning medicine to decrease your risk of developing blood clots in your leg. Document Released: 12/23/2000 Document Revised: 01/31/2014 Document Reviewed: 10/27/2011 [facility] Patient Information 2015 [place], [place]. This information is not intended to replace advice given to you by your health care provider. Make sure you discuss any questions you have with your health care provider. PATIENT INSTRUCTIONS POST-ANESTHESIA  IMMEDIATELY FOLLOWING SURGERY:  Do not drive or operate machinery for the first twenty four hours after surgery.  Do not make any important decisions for twenty four hours after surgery or while taking narcotic pain medications or sedatives.  If you develop intractable nausea and vomiting or a severe headache please notify your doctor immediately.  FOLLOW-UP:  Please make an appointment with your surgeon as instructed. You do not need to follow up with anesthesia unless specifically instructed to do so.  WOUND CARE INSTRUCTIONS (if applicable):  Keep a dry clean dressing on the anesthesia/puncture wound site if there is drainage.  Once the wound has quit draining you may leave it open to air.  Generally you should leave the bandage intact for twenty four hours unless there is drainage.  If the epidural site drains for more than 36-48 hours please call the anesthesia department.  QUESTIONS?:  Please feel free to call your physician or the hospital operator if you have any questions, and they will be happy to assist you.

## 2015-04-04 NOTE — Progress Notes (Signed)
   04/04/15 1004  OBSTRUCTIVE SLEEP APNEA  Have you ever been diagnosed with sleep apnea through a sleep study? No  Do you snore loudly (loud enough to be heard through closed doors)?  0  Do you often feel tired, fatigued, or sleepy during the daytime? 0  Has anyone observed you stop breathing during your sleep? 0  Do you have, or are you being treated for high blood pressure? 1  BMI more than 35 kg/m2? 1  Age over 68 years old? 1  Neck circumference greater than 40 cm/16 inches? 0  Gender: 1

## 2015-04-04 NOTE — Pre-Procedure Instructions (Signed)
Patient given information to sign up for my chart at home. Patient does not have smart phone or computer and does not want to stay and watch emmi video.

## 2015-04-05 ENCOUNTER — Other Ambulatory Visit: Payer: Self-pay | Admitting: *Deleted

## 2015-04-05 DIAGNOSIS — M1712 Unilateral primary osteoarthritis, left knee: Secondary | ICD-10-CM | POA: Diagnosis not present

## 2015-04-05 DIAGNOSIS — M25561 Pain in right knee: Secondary | ICD-10-CM | POA: Diagnosis not present

## 2015-04-05 DIAGNOSIS — D649 Anemia, unspecified: Secondary | ICD-10-CM

## 2015-04-05 DIAGNOSIS — K7581 Nonalcoholic steatohepatitis (NASH): Secondary | ICD-10-CM | POA: Diagnosis not present

## 2015-04-05 DIAGNOSIS — E1140 Type 2 diabetes mellitus with diabetic neuropathy, unspecified: Secondary | ICD-10-CM | POA: Diagnosis not present

## 2015-04-05 DIAGNOSIS — Z794 Long term (current) use of insulin: Secondary | ICD-10-CM | POA: Diagnosis not present

## 2015-04-05 DIAGNOSIS — I872 Venous insufficiency (chronic) (peripheral): Secondary | ICD-10-CM | POA: Diagnosis not present

## 2015-04-05 DIAGNOSIS — L89152 Pressure ulcer of sacral region, stage 2: Secondary | ICD-10-CM | POA: Diagnosis not present

## 2015-04-05 MED ORDER — OXYCODONE-ACETAMINOPHEN 7.5-325 MG PO TABS: 1.0000 | ORAL_TABLET | Freq: Three times a day (TID) | ORAL | Status: DC | PRN

## 2015-04-05 NOTE — Telephone Encounter (Signed)
Call pt he will need to f/u with GI about his anemia before the surgery , he can schedule this

## 2015-04-05 NOTE — Telephone Encounter (Signed)
Call placed to patient.   States that blood work noted too low at pre-op and he will be set up for injections. Reviewed labs and noted Hgb 9.7/ Hct 31.9.  Prescription printed and patient made aware to come to office to pick up after 2pm on 04/05/2015.  Also spoke with [place], [place] with [facility]. Reports that SN will have to discharge as no further SN visits allowed by pt insurance. Patient to be discharged as of 04/05/2015.  MD to be made aware.

## 2015-04-05 NOTE — Telephone Encounter (Signed)
Received call from [facility] with [employer].   Reports that patient filled pain prescription on 03/13/2015 for 90 tabs and is to early to fill at this time. Patient is completely out of medication and reports that he has been taking medication more frequently than TID PRN ordered on prescription.   As prescription is written now, patient cannot fill until at least 04/10/2015. Dorothea Ogle states that directions and quantity can be increased via phone and patient can pick up today.   MD please advise.

## 2015-04-05 NOTE — Telephone Encounter (Signed)
Okay to change to eery 6 hours

## 2015-04-05 NOTE — Telephone Encounter (Signed)
04/04/15 Status is "Approved" for surgery as noted, for admit/in-patient date 04/05/15, under same Authorization/Reference, # [id number]

## 2015-04-06 ENCOUNTER — Telehealth: Payer: Self-pay | Admitting: Orthopedic Surgery

## 2015-04-06 ENCOUNTER — Other Ambulatory Visit: Payer: Self-pay | Admitting: *Deleted

## 2015-04-06 ENCOUNTER — Telehealth: Payer: Self-pay | Admitting: Family Medicine

## 2015-04-06 DIAGNOSIS — L89312 Pressure ulcer of right buttock, stage 2: Secondary | ICD-10-CM | POA: Diagnosis not present

## 2015-04-06 NOTE — Telephone Encounter (Signed)
He has a prescription of Percocet 7-1/2 mg every 8 hours dated July 6

## 2015-04-06 NOTE — Telephone Encounter (Signed)
Call placed to pharmacy and Dorothea Ogle made aware.   No change in quantity given.   Call placed to patient to make aware.

## 2015-04-06 NOTE — Telephone Encounter (Signed)
Routing to Dr Harrison 

## 2015-04-06 NOTE — Telephone Encounter (Signed)
Update 04/06/15 - Patient's surgery is now cancelled, per Dr Aline Brochure, due to medical reasons; no re-schedule date has been determined at this time. [employer] to notify; relayed to [employer], 04/06/15,5:35PM,done

## 2015-04-06 NOTE — Telephone Encounter (Signed)
Per phone with resolution specialist, Wyonia Hough, at [employer]; verified that Admit date/Surgery date (CPT [phone]) is 04/07/15; Confirmed this date is now on file under same Authorization# [id number].

## 2015-04-06 NOTE — Telephone Encounter (Signed)
Patient is calling stating that he cant sleep at night due to pain, he is asking for pain medication until Knee Replacement can be done, please advise?

## 2015-04-06 NOTE — Telephone Encounter (Signed)
NOTED

## 2015-04-06 NOTE — Telephone Encounter (Signed)
Patient is calling about some lab results that were done at [facility] for surgery  [phone]

## 2015-04-06 NOTE — Telephone Encounter (Signed)
Call placed to patient.   Advised to discuss lab results with Dr. Aline Brochure.

## 2015-04-07 ENCOUNTER — Encounter (HOSPITAL_COMMUNITY): Admission: RE | Payer: Self-pay | Source: Ambulatory Visit

## 2015-04-07 ENCOUNTER — Inpatient Hospital Stay (HOSPITAL_COMMUNITY): Admission: RE | Admit: 2015-04-07 | Payer: Medicare Other | Source: Ambulatory Visit | Admitting: Orthopedic Surgery

## 2015-04-07 DIAGNOSIS — L89152 Pressure ulcer of sacral region, stage 2: Secondary | ICD-10-CM | POA: Diagnosis not present

## 2015-04-07 DIAGNOSIS — M1712 Unilateral primary osteoarthritis, left knee: Secondary | ICD-10-CM | POA: Diagnosis not present

## 2015-04-07 DIAGNOSIS — M25561 Pain in right knee: Secondary | ICD-10-CM | POA: Diagnosis not present

## 2015-04-07 DIAGNOSIS — E1140 Type 2 diabetes mellitus with diabetic neuropathy, unspecified: Secondary | ICD-10-CM | POA: Diagnosis not present

## 2015-04-07 SURGERY — ARTHROPLASTY, KNEE, TOTAL
Anesthesia: Spinal | Laterality: Left

## 2015-04-09 LAB — TYPE AND SCREEN
ABO/RH(D): A POS
ANTIBODY SCREEN: NEGATIVE
UNIT DIVISION: 0
Unit division: 0
Unit division: 0
Unit division: 0

## 2015-04-10 ENCOUNTER — Other Ambulatory Visit: Payer: Self-pay | Admitting: Family Medicine

## 2015-04-10 ENCOUNTER — Telehealth: Payer: Self-pay | Admitting: Orthopedic Surgery

## 2015-04-10 NOTE — Telephone Encounter (Signed)
Michael [name] is calling asking about the injections he was supposed to be getting at the hospital for low Hgb, he has not heard a word, please advise?

## 2015-04-10 NOTE — Telephone Encounter (Signed)
Patient aware that shortstay will call him with appt

## 2015-04-11 NOTE — Telephone Encounter (Signed)
Refill appropriate and filled per protocol. 

## 2015-04-14 NOTE — Progress Notes (Signed)
Incomplete orders received from Dr Aline Brochure for [place]. Dr Aline Brochure notified. Instructed to cancel order for procrit and he would take care of new order on Monday 04/10/15.

## 2015-04-17 ENCOUNTER — Other Ambulatory Visit: Payer: Self-pay | Admitting: Family Medicine

## 2015-04-17 ENCOUNTER — Ambulatory Visit: Payer: Medicare Other | Admitting: Orthopedic Surgery

## 2015-04-17 ENCOUNTER — Other Ambulatory Visit: Payer: Self-pay | Admitting: *Deleted

## 2015-04-17 ENCOUNTER — Telehealth: Payer: Self-pay | Admitting: Orthopedic Surgery

## 2015-04-17 DIAGNOSIS — D649 Anemia, unspecified: Secondary | ICD-10-CM

## 2015-04-17 MED ORDER — PRAVASTATIN SODIUM 40 MG PO TABS: 40.0000 mg | ORAL_TABLET | Freq: Every evening | ORAL | Status: DC

## 2015-04-17 NOTE — Telephone Encounter (Signed)
Routing to Carol

## 2015-04-17 NOTE — Telephone Encounter (Signed)
No

## 2015-04-17 NOTE — Telephone Encounter (Signed)
(  1) I called patient and notified that this appointment for 04/20/15(which was for post op) has been cancelled until surgery is re-scheduled. (2) Received call back from [place], ph# [phone], to follow up on hemoglobin injection. Please call.

## 2015-04-17 NOTE — Telephone Encounter (Signed)
Does patient need to keep post op appt?

## 2015-04-17 NOTE — Telephone Encounter (Signed)
Call received from patient regarding (1) the hemoglobin injection, which has not yet been scheduled; he states he had called to [facility] to follow up on status; states he had asked for Bard Herbert, who in turn contacted supervisor at [facility].  Our office had faxed the order as noted, including a 3rd time today, to [facility].  Patient was given name of Abbie Sons, at [facility], who was to be working on this.  (2) patient's appointment slot is still held for 04/20/15, which was to be his post op#1 visit (surgery had been cancelled due to the medical reasons previously noted.  Does he need to be seen for a visit at this time for any reason?

## 2015-04-17 NOTE — Telephone Encounter (Signed)
Medication refilled per protocol. 

## 2015-04-18 NOTE — Telephone Encounter (Signed)
Michael [name] states patient is aware that hematology will be handling the injection

## 2015-04-20 ENCOUNTER — Ambulatory Visit: Payer: Medicare Other | Admitting: Orthopedic Surgery

## 2015-04-20 ENCOUNTER — Encounter: Payer: Self-pay | Admitting: Physician Assistant

## 2015-04-20 ENCOUNTER — Ambulatory Visit (INDEPENDENT_AMBULATORY_CARE_PROVIDER_SITE_OTHER): Payer: Medicare Other | Admitting: Physician Assistant

## 2015-04-20 DIAGNOSIS — R600 Localized edema: Secondary | ICD-10-CM

## 2015-04-20 DIAGNOSIS — I8311 Varicose veins of right lower extremity with inflammation: Secondary | ICD-10-CM | POA: Diagnosis not present

## 2015-04-20 DIAGNOSIS — I10 Essential (primary) hypertension: Secondary | ICD-10-CM | POA: Diagnosis not present

## 2015-04-20 DIAGNOSIS — I872 Venous insufficiency (chronic) (peripheral): Secondary | ICD-10-CM

## 2015-04-20 DIAGNOSIS — I8312 Varicose veins of left lower extremity with inflammation: Secondary | ICD-10-CM

## 2015-04-20 MED ORDER — NEBIVOLOL HCL 10 MG PO TABS: 10.0000 mg | ORAL_TABLET | Freq: Every day | ORAL | Status: DC

## 2015-04-20 NOTE — Progress Notes (Signed)
Patient ID: Michael [name] MRN: [id number], DOB: Feb 13, 1946, 69 y.o. Date of Encounter: _0 @  Chief Complaint:  Chief Complaint  Patient presents with  . infected leg    left leg and feels like it has moved to right leg, has some itching and burning and tingles in left leg, some edema  . OTHER    needs RX for oxycodone refilled( was refilled on 04/05/15)    HPI: 69 y.o. year old white male  presents with above.   The reason that he came in today and scheduled this visit today was because he has noticed some spots on his lower legs for the past 1-1/2 weeks. He is concerned that these are some type of infection such as MRSA that he may have caught while at the hospital and it may prevent him from undergoing knee surgery that he is trying to have scheduled.  Says that these bumps start out as "pimples "and then clear liquid comes out.  I asked him how he thinks his swelling today compares to how it has been. He says that home health nurse was coming and doing wraps that Dr. Buelah Manis ordered. Says  that the [facility] probably stopped coming about one month ago. He thinks that the swelling today is about the same as it was a month ago. That the amount of swelling is stable.   Past Medical History  Diagnosis Date  . COPD (chronic obstructive pulmonary disease)   . Type 2 diabetes mellitus   . Hyperlipidemia   . Essential hypertension   . Iron deficiency anemia   . NASH (nonalcoholic steatohepatitis)   . Diabetic neuropathy   . GERD (gastroesophageal reflux disease)   . Arthritis   . Depression      Home Meds: Outpatient Prescriptions Prior to Visit  Medication Sig Dispense Refill  . aspirin EC 81 MG tablet Take 81 mg by mouth daily.    . B-D INS SYRINGE 0.5CC/31GX5/16 31G X 5/16" 0.5 ML MISC     . benazepril (LOTENSIN) 40 MG tablet TAKE ONE TABLET BY MOUTH DAILY. 90 tablet 1  . diclofenac sodium (VOLTAREN) 1 % GEL Apply 4 g topically 4 (four) times daily. 5 Tube 5  . Ferrous  Sulfate (IRON) 325 (65 FE) MG TABS Take 1 tablet by mouth daily.     Marland Kitchen gabapentin (NEURONTIN) 400 MG capsule TAKE ONE CAPSULE BY MOUTH THREE TIMES DAILY. 90 capsule 2  . glipiZIDE (GLUCOTROL) 10 MG tablet TAKE ONE TABLET BY MOUTH TWICE DAILY BEFORE MEALS. 180 tablet 1  . Insulin Glargine (LANTUS) 100 UNIT/ML Solostar Pen Inject 30 Units into the skin at bedtime. 15 mL 11  . levothyroxine (SYNTHROID, LEVOTHROID) 25 MCG tablet TAKE 1 TABLET BY MOUTH DAILY BEFORE BREAKFAST. 30 tablet 6  . meloxicam (MOBIC) 15 MG tablet Take 15 mg by mouth daily.     . metFORMIN (GLUCOPHAGE) 1000 MG tablet TAKE ONE TABLET BY MOUTH TWICE DAILY WITH A MEAL. 60 tablet 6  . naproxen (NAPROSYN) 375 MG tablet     . nystatin cream (MYCOSTATIN) Apply 1 application topically 2 (two) times daily. 45 g 1  . omeprazole (PRILOSEC) 20 MG capsule TAKE ONE CAPSULE BY MOUTH TWICE DAILY. 60 capsule 11  . oxyCODONE-acetaminophen (PERCOCET) 7.5-325 MG per tablet Take 1 tablet by mouth every 8 (eight) hours as needed for severe pain. 90 tablet 0  . polyethylene glycol-electrolytes (TRILYTE) 420 G solution Take 4,000 mLs by mouth as directed. 4000 mL 0  .  potassium chloride (KLOR-CON M10) 10 MEQ tablet TAKE 1 TABLET (10 MEQ TOTAL) BY MOUTH twice a day with fluid pill (Patient taking differently: Take 10 mEq by mouth 2 (two) times daily. TAKE 1 TABLET (10 MEQ TOTAL) BY MOUTH twice a day with fluid pill) 60 tablet 3  . pravastatin (PRAVACHOL) 40 MG tablet Take 1 tablet (40 mg total) by mouth every evening. 90 tablet 1  . torsemide (DEMADEX) 20 MG tablet Take 2 in AM and 1 at 5pm with potassium (Patient taking differently: Take 40 mg by mouth daily. Take 2 in the morning.) 90 tablet 3  . UNABLE TO FIND Deliver one Semi-Electric Hospital Bed with 1/2 rails and Gel overlay. 1 each 0  . amLODipine (NORVASC) 10 MG tablet TAKE ONE TABLET BY MOUTH DAILY. 90 tablet 1   No facility-administered medications prior to visit.    Allergies: No Known  Allergies  History   Social History  . Marital Status: Widowed    Spouse Name: N/A  . Number of Children: N/A  . Years of Education: 12   Occupational History  . Not on file.   Social History Main Topics  . Smoking status: Former Smoker -- 2.50 packs/day for 44 years    Types: Cigarettes  . Smokeless tobacco: Former Systems developer    Quit date: 10/01/1999  . Alcohol Use: No  . Drug Use: No  . Sexual Activity: Yes    Birth Control/ Protection: None   Other Topics Concern  . Not on file   Social History Narrative    Family History  Problem Relation Age of Onset  . Heart disease Mother   . Hyperlipidemia Mother   . Hypertension Mother   . Depression Mother   . Diabetes Mother   . Heart disease Father   . Hypertension Father   . Hyperlipidemia Father   . Diabetes Father   . Heart disease Sister   . Hyperlipidemia Sister   . Hypertension Sister   . Diabetes Sister   . Diabetes Brother   . Heart disease Sister   . Hyperlipidemia Sister   . Hypertension Sister   . Heart disease Sister   . Hyperlipidemia Sister   . Hypertension Sister   . Diabetes Sister   . Diabetes Brother   . Rectal cancer Mother      Review of Systems:  See HPI for pertinent ROS. All other ROS negative.    Physical Exam: Blood pressure 140/80, pulse 90, temperature 98.6 F (37 C), temperature source Oral, resp. rate 20, weight 216 lb (97.977 kg)., Body mass index is 37.06 kg/(m^2). General: Morbidly Obese WM. Appears in no acute distress. Neck: Supple. No thyromegaly. No lymphadenopathy. Lungs: Clear bilaterally to auscultation without wheezes, rales, or rhonchi. Breathing is unlabored. Heart: RRR with S1 S2. No murmurs, rubs, or gallops. Musculoskeletal:  Strength and tone normal for age. Extremities/Skin: He has 3+ pitting edema up to just above ankles.  Has 2+edema 1/3-1/2 way up calves bilaterally.  He has 5 "spots" on his left shin and 2 "spots" on his right lateral shin. Each of these  "spots" are minimally raised, pink areas. Each is <1cm diameter.  No signs of infection and no signs of cellulitis.   Neuro: Alert and oriented X 3. Moves all extremities spontaneously. Gait is normal. CNII-XII grossly in tact. Psych:  Responds to questions appropriately with a normal affect.     ASSESSMENT AND PLAN:  69 y.o. year old male with  1. OBESITY, MORBID  2. Essential hypertension Stop Norvasc in case this is contributing to edema.  Will start Bystolic to keep blood pressure controlled with stopping of Norvasc. - nebivolol (BYSTOLIC) 10 MG tablet; Take 1 tablet (10 mg total) by mouth daily.  Dispense: 30 tablet; Refill: 3  3. Venous stasis dermatitis of both lower extremities  4. Bilateral lower extremity edema   When I went to discuss new dosing of Demadex as I was planning to increase the dose--- he says that currently he is just doing the 2 pills in the morning and is not doing a pill at 5 PM anymore. When Dr. Buelah Manis arranged for the nurse to do the wraps to his legs that the evening dose was stopped. Told him to restart that 5 PM dose. He turned to taking 2 in the morning and 1 at 5 PM. I reviewed that he had a be met 04/04/15 at which time potassium BUN and creatinine were all normal.  Also discussed with him: TED hose--- he says that in the past he went to [place] and they even discussed ordering in a larger size but said that they did not get in and size large enough to fit his ankles and his ankles were 2 big to fit even the largest size they could order. I talked to him about checking with [facility] or [facility] to see if they can get a larger size.  He also referred to "elevating his legs "during our conversation. Says that he sits in the recliner with his legs elevated. I explained to him that all this does is gets the fluid up to his thighs. Not getting the fluid all the way back up to his heart. He needs to lie flat on the  couch/sofa or on a bed and then have his legs elevated up higher than heart level.  He is to stop Norvasc and replace this with Bystolic. He is to resume the p.m. dose of Demadex.  He is to schedule follow-up visit with Dr. Buelah Manis an approximate 5 days. The beginning of the upcoming week.     Signed, [address] [place], [place], [facility] 04/20/2015 10:47 AM

## 2015-04-24 ENCOUNTER — Encounter: Payer: Self-pay | Admitting: Family Medicine

## 2015-04-24 ENCOUNTER — Ambulatory Visit (INDEPENDENT_AMBULATORY_CARE_PROVIDER_SITE_OTHER): Payer: Medicare Other | Admitting: Family Medicine

## 2015-04-24 VITALS — BP 138/72 | HR 88 | Temp 98.2°F | Resp 16 | Ht 59.0 in | Wt 216.0 lb

## 2015-04-24 DIAGNOSIS — I5189 Other ill-defined heart diseases: Secondary | ICD-10-CM

## 2015-04-24 DIAGNOSIS — I872 Venous insufficiency (chronic) (peripheral): Secondary | ICD-10-CM

## 2015-04-24 DIAGNOSIS — I8312 Varicose veins of left lower extremity with inflammation: Secondary | ICD-10-CM | POA: Diagnosis not present

## 2015-04-24 DIAGNOSIS — R609 Edema, unspecified: Secondary | ICD-10-CM | POA: Diagnosis not present

## 2015-04-24 DIAGNOSIS — I8311 Varicose veins of right lower extremity with inflammation: Secondary | ICD-10-CM

## 2015-04-24 DIAGNOSIS — I519 Heart disease, unspecified: Secondary | ICD-10-CM | POA: Diagnosis not present

## 2015-04-24 DIAGNOSIS — E1140 Type 2 diabetes mellitus with diabetic neuropathy, unspecified: Secondary | ICD-10-CM | POA: Diagnosis not present

## 2015-04-24 DIAGNOSIS — E1149 Type 2 diabetes mellitus with other diabetic neurological complication: Secondary | ICD-10-CM

## 2015-04-24 LAB — HEMOGLOBIN A1C, FINGERSTICK: HEMOGLOBIN A1C, FINGERSTICK: 6.7 % — AB (ref <5.7)

## 2015-04-24 MED ORDER — CLOBETASOL PROPIONATE 0.05 % EX CREA: 1.0000 "application " | TOPICAL_CREAM | Freq: Two times a day (BID) | CUTANEOUS | Status: DC

## 2015-04-24 MED ORDER — OXYCODONE-ACETAMINOPHEN 7.5-325 MG PO TABS: 1.0000 | ORAL_TABLET | Freq: Four times a day (QID) | ORAL | Status: DC | PRN

## 2015-04-24 MED ORDER — BUMETANIDE 1 MG PO TABS: 1.0000 mg | ORAL_TABLET | Freq: Two times a day (BID) | ORAL | Status: DC

## 2015-04-24 NOTE — Progress Notes (Signed)
Patient ID: Michael [name], male   DOB: Apr 15, 1946, 69 y.o.   MRN: [id number]   Subjective:    Patient ID: Michael [name], male    DOB: 10-06-1945, 69 y.o.   MRN: [id number]  Patient presents for F/U Edema  agent here for interim follow-up on his chronic peripheral edema. This is been persistent for the past 3-4 months. He was on Lasix in the past this did not help therefore he was changed to furosemide which is working for some time. He has been elevating his legs however he has been unable to get compression hose that fit. He is urinating a significant amount but still has edema. We tried compression wraps the past few weeks with [facility] but this did not help the swelling actually now has some irritation to the skin after the wraps were last taken off. He describes this as an itching sensation states that he had 2 spots that leak some clear fluid. He was seen last week at our office at that time amlodipine was discontinued which I believe we have tried this in the past but did not help his swelling. There is no significant difference with his swelling. He is currently on bi-systolic. He is unable to have his knee replacement done secondary to the swelling as well as chronic anemia which she does have follow-up with hematology for    Review Of Systems:  GEN- denies fatigue, fever, weight loss,weakness, recent illness HEENT- denies eye drainage, change in vision, nasal discharge, CVS- denies chest pain, palpitations RESP- denies SOB, cough, wheeze ABD- denies N/V, change in stools, abd pain GU- denies dysuria, hematuria, dribbling, incontinence MSK- +joint pain, muscle aches, injury Neuro- denies headache, dizziness, syncope, seizure activity       Objective:    BP 138/72 mmHg  Pulse 88  Temp(Src) 98.2 F (36.8 C) (Oral)  Resp 16  Ht 4' 11"  (1.499 m)  Wt 216 lb (97.977 kg)  BMI 43.60 kg/m2 GEN- NAD, alert and oriented x3 CVS- RRR, no  murmur RESP-CTAB ABD-NABS,soft,NT,ND EXT- 1+ pitting edema to kneeds Pulses- Radial, DP- 1+        Assessment & Plan:      Problem List Items Addressed This Visit    Venous stasis dermatitis - Primary    Clobetasol topical twice a day      Type II diabetes mellitus with neurological manifestations    A1c in office today 6.7% showing very good control of his diabetes to change her current regimen      Relevant Orders   Hemoglobin A1C, fingerstick (Completed)   Peripheral edema    Chronic peripheral edema with some mild diastolic dysfunction noted on echo performed in 2014. He is tried and failed Lasix currently on higher dose torsemide and he is urinating very well however there is no change in the edema. The edema is prohibiting him from having his knee surgery. I will change him to Bumex and I'm also going to consult cardiology. We have tried getting compression hose however due to his dimensions they're finding difficulty getting stockings for him. We've also tried compression wraps for the past month but these did not help      Relevant Orders   Ambulatory referral to Cardiology   Diastolic dysfunction   Relevant Orders   Ambulatory referral to Cardiology      Note: This dictation was prepared with Dragon dictation along with smaller phrase technology. Any transcriptional errors that result from this process are unintentional.

## 2015-04-24 NOTE — Assessment & Plan Note (Signed)
A1c in office today 6.7% showing very good control of his diabetes to change her current regimen

## 2015-04-24 NOTE — Patient Instructions (Signed)
Referral to cardiology Start bumex twice a day  Stop the demadex Use the cream on legs  F/U as previous

## 2015-04-24 NOTE — Assessment & Plan Note (Signed)
Chronic peripheral edema with some mild diastolic dysfunction noted on echo performed in 2014. He is tried and failed Lasix currently on higher dose torsemide and he is urinating very well however there is no change in the edema. The edema is prohibiting him from having his knee surgery. I will change him to Bumex and I'm also going to consult cardiology. We have tried getting compression hose however due to his dimensions they're finding difficulty getting stockings for him. We've also tried compression wraps for the past month but these did not help

## 2015-04-24 NOTE — Assessment & Plan Note (Signed)
Clobetasol topical twice a day

## 2015-05-01 ENCOUNTER — Encounter: Payer: Medicare Other | Admitting: Family Medicine

## 2015-05-01 ENCOUNTER — Encounter: Payer: Self-pay | Admitting: Cardiology

## 2015-05-01 ENCOUNTER — Ambulatory Visit (INDEPENDENT_AMBULATORY_CARE_PROVIDER_SITE_OTHER): Payer: Medicare Other | Admitting: Cardiology

## 2015-05-01 VITALS — BP 138/72 | HR 78 | Ht 64.0 in | Wt 224.0 lb

## 2015-05-01 DIAGNOSIS — R0602 Shortness of breath: Secondary | ICD-10-CM

## 2015-05-01 DIAGNOSIS — R600 Localized edema: Secondary | ICD-10-CM

## 2015-05-01 MED ORDER — METOLAZONE 5 MG PO TABS: ORAL_TABLET | ORAL | Status: DC

## 2015-05-01 NOTE — Patient Instructions (Signed)
Your physician recommends that you schedule a follow-up appointment in:  1 week    Take Metolazone 5 mg tomorrow am only    Get lab work in 1 week    Your physician has requested that you have an echocardiogram. Echocardiography is a painless test that uses sound waves to create images of your heart. It provides your doctor with information about the size and shape of your heart and how well your heart's chambers and valves are working. This procedure takes approximately one hour. There are no restrictions for this procedure.      Thank you for choosing [facility] !

## 2015-05-01 NOTE — Progress Notes (Signed)
Patient ID: ZI [place], male   DOB: 01/21/1946, 69 y.o.   MRN: [id number]     Clinical Summary Mr. Gelles is a 69 y.o.male seen today as a new patient for the following medical problems.  1. Lower extremity edema - symptoms started 4 months ago.  - norvasc stopped, no change in symptoms - notes some SOB over that time. Sleeps in recliner chronically for leg comfort but also reports orthopnea over the last few months - was not able to be fitted for compression stockings due to leg size - recently changed to bumex by pcp as lasix and toresmide ineffective, taking bumex58m bid. Weight 216 up to 224 lbs. Last labs 3 weeks ago Cr 0.62 - 04/2013 echo LVEF 552-84% grade I diastolic dysfunction  2. COPD - followed by pcp  3. Hx of NASH  4. OSA screen - unsure if snores, + daytime somnolence  Past Medical History  Diagnosis Date  . COPD (chronic obstructive pulmonary disease)   . Type 2 diabetes mellitus   . Hyperlipidemia   . Essential hypertension   . Iron deficiency anemia   . NASH (nonalcoholic steatohepatitis)   . Diabetic neuropathy   . GERD (gastroesophageal reflux disease)   . Arthritis   . Depression   . Diastolic dysfunction      No Known Allergies   Current Outpatient Prescriptions  Medication Sig Dispense Refill  . ACCU-CHEK AVIVA PLUS test strip     . aspirin EC 81 MG tablet Take 81 mg by mouth daily.    . B-D INS SYRINGE 0.5CC/31GX5/16 31G X 5/16" 0.5 ML MISC     . benazepril (LOTENSIN) 40 MG tablet TAKE ONE TABLET BY MOUTH DAILY. 90 tablet 1  . bumetanide (BUMEX) 1 MG tablet Take 1 tablet (1 mg total) by mouth 2 (two) times daily. 60 tablet 3  . clobetasol cream (TEMOVATE) 01.32% Apply 1 application topically 2 (two) times daily. 45 g 1  . diclofenac sodium (VOLTAREN) 1 % GEL Apply 4 g topically 4 (four) times daily. 5 Tube 5  . Ferrous Sulfate (IRON) 325 (65 FE) MG TABS Take 1 tablet by mouth daily.     .Marland Kitchengabapentin (NEURONTIN) 400 MG capsule TAKE ONE  CAPSULE BY MOUTH THREE TIMES DAILY. 90 capsule 2  . glipiZIDE (GLUCOTROL) 10 MG tablet TAKE ONE TABLET BY MOUTH TWICE DAILY BEFORE MEALS. 180 tablet 1  . Insulin Glargine (LANTUS) 100 UNIT/ML Solostar Pen Inject 30 Units into the skin at bedtime. 15 mL 11  . levothyroxine (SYNTHROID, LEVOTHROID) 25 MCG tablet TAKE 1 TABLET BY MOUTH DAILY BEFORE BREAKFAST. 30 tablet 6  . meloxicam (MOBIC) 15 MG tablet Take 15 mg by mouth daily.     . metFORMIN (GLUCOPHAGE) 1000 MG tablet TAKE ONE TABLET BY MOUTH TWICE DAILY WITH A MEAL. 60 tablet 6  . naproxen (NAPROSYN) 375 MG tablet     . nebivolol (BYSTOLIC) 10 MG tablet Take 1 tablet (10 mg total) by mouth daily. 30 tablet 3  . nystatin cream (MYCOSTATIN) Apply 1 application topically 2 (two) times daily. 45 g 1  . omeprazole (PRILOSEC) 20 MG capsule TAKE ONE CAPSULE BY MOUTH TWICE DAILY. 60 capsule 11  . oxyCODONE-acetaminophen (PERCOCET) 7.5-325 MG per tablet Take 1 tablet by mouth every 6 (six) hours as needed for severe pain. 90 tablet 0  . polyethylene glycol-electrolytes (TRILYTE) 420 G solution Take 4,000 mLs by mouth as directed. 4000 mL 0  . potassium chloride (KLOR-CON M10) 10 MEQ  tablet TAKE 1 TABLET (10 MEQ TOTAL) BY MOUTH twice a day with fluid pill (Patient taking differently: Take 10 mEq by mouth 2 (two) times daily. TAKE 1 TABLET (10 MEQ TOTAL) BY MOUTH twice a day with fluid pill) 60 tablet 3  . pravastatin (PRAVACHOL) 40 MG tablet Take 1 tablet (40 mg total) by mouth every evening. 90 tablet 1  . UNABLE TO FIND Deliver one Semi-Electric Hospital Bed with 1/2 rails and Gel overlay. 1 each 0   No current facility-administered medications for this visit.     Past Surgical History  Procedure Laterality Date  . Hernia repair    . Shoulder surgery      Rght-rotator cuff  . Lipoma removal      Stomach  . Knee arthroscopy with medial menisectomy Left 11/06/2012    Procedure: KNEE ARTHROSCOPY WITH MEDIAL MENISECTOMY;  Surgeon: Carole Civil, MD;  Location: [facility];  Service: Orthopedics;  Laterality: Left;  . Lumbar laminectomy/decompression microdiscectomy Left 08/30/2013    Procedure: LUMBAR LAMINECTOMY/DECOMPRESSION MICRODISCECTOMY LEFT  LUMBAR TWO THREE;  Surgeon: Otilio Connors, MD;  Location: [place] [facility];  Service: Neurosurgery;  Laterality: Left;  . Egd with enteroscopy  2011    [facility]: normal esophagus and stomach. Normal duodenum, jejunum. No evidence of AVMs.   . Colonoscopy  2011    [facility]: normal colon, normal distal ileum  . Colonoscopy with propofol N/A 07/21/2014    Procedure: ATTEMPTED COLONOSCOPY WITH PROPOFOL-HAD TO STOP DUE TO BRADYCARDIA;  Surgeon: Daneil Dolin, MD;  Location: [facility];  Service: Endoscopy;  Laterality: N/A;  . Esophagogastroduodenoscopy (egd) with propofol N/A 07/21/2014    Procedure: ESOPHAGOGASTRODUODENOSCOPY (EGD) WITH PROPOFOL;  Surgeon: Daneil Dolin, MD;  Location: [facility];  Service: Endoscopy;  Laterality: N/A;  . Esophageal biopsy N/A 07/21/2014    Procedure: BIOPSY;  Surgeon: Daneil Dolin, MD;  Location: [facility];  Service: Endoscopy;  Laterality: N/A;     No Known Allergies    Family History  Problem Relation Age of Onset  . Heart disease Mother   . Hyperlipidemia Mother   . Hypertension Mother   . Depression Mother   . Diabetes Mother   . Heart disease Father   . Hypertension Father   . Hyperlipidemia Father   . Diabetes Father   . Heart disease Sister   . Hyperlipidemia Sister   . Hypertension Sister   . Diabetes Sister   . Diabetes Brother   . Heart disease Sister   . Hyperlipidemia Sister   . Hypertension Sister   . Heart disease Sister   . Hyperlipidemia Sister   . Hypertension Sister   . Diabetes Sister   . Diabetes Brother   . Rectal cancer Mother      Social History Mr. Rathert reports that he has quit smoking. His smoking use included Cigarettes. He has a 110 pack-year smoking history. He quit smokeless tobacco use about 15 years  ago. Mr. Poplaski reports that he does not drink alcohol.   Review of Systems CONSTITUTIONAL: No weight loss, fever, chills, weakness or fatigue.  HEENT: Eyes: No visual loss, blurred vision, double vision or yellow sclerae.No hearing loss, sneezing, congestion, runny nose or sore throat.  SKIN: No rash or itching.  CARDIOVASCULAR: per HPI RESPIRATORY: +SOB GASTROINTESTINAL: No anorexia, nausea, vomiting or diarrhea. No abdominal pain or blood.  GENITOURINARY: No burning on urination, no polyuria NEUROLOGICAL: No headache, dizziness, syncope, paralysis, ataxia, numbness or tingling in the extremities.  No change in bowel or bladder control.  MUSCULOSKELETAL: leg pain  LYMPHATICS: No enlarged nodes. No history of splenectomy.  PSYCHIATRIC: No history of depression or anxiety.  ENDOCRINOLOGIC: No reports of sweating, cold or heat intolerance. No polyuria or polydipsia.  Marland Kitchen   Physical Examination Filed Vitals:   05/01/15 1348  BP: 138/72  Pulse: 78   Filed Vitals:   05/01/15 1348  Height: 5' 4"  (1.626 m)  Weight: 224 lb (101.606 kg)    Gen: resting comfortably, no acute distress HEENT: no scleral icterus, pupils equal round and reactive, no palptable cervical adenopathy,  CV:: RRR, no m/r/g, + JVD just below angle of jaw Resp: Clear to auscultation bilaterally GI: abdomen is soft, non-tender, non-distended, normal bowel sounds, no hepatosplenomegaly MSK: extremities are warm, 2+ bilateral edema Skin: warm, no rash Neuro:  no focal deficits Psych: appropriate affect     Assessment and Plan   1. LE edema - did not responsd to lasix or torsemide, changed to bumex recenlty and still gaining weight - will repeat echo - will give metolazone 28m once, continue bumex. Follow up in 1 week, if no significant diuresis plan is for hospital admission form clinic for IV diuresis.      JArnoldo Lenis M.D.

## 2015-05-02 ENCOUNTER — Encounter (HOSPITAL_COMMUNITY): Payer: Medicare Other

## 2015-05-02 ENCOUNTER — Encounter (HOSPITAL_COMMUNITY): Payer: Medicare Other | Attending: Oncology | Admitting: Oncology

## 2015-05-02 ENCOUNTER — Encounter (HOSPITAL_COMMUNITY): Payer: Self-pay | Admitting: Oncology

## 2015-05-02 ENCOUNTER — Other Ambulatory Visit (HOSPITAL_COMMUNITY): Payer: Self-pay

## 2015-05-02 VITALS — BP 136/66 | HR 77 | Temp 98.5°F | Resp 18 | Ht 59.0 in | Wt 222.0 lb

## 2015-05-02 DIAGNOSIS — R0600 Dyspnea, unspecified: Secondary | ICD-10-CM | POA: Diagnosis not present

## 2015-05-02 DIAGNOSIS — M19041 Primary osteoarthritis, right hand: Secondary | ICD-10-CM

## 2015-05-02 DIAGNOSIS — M19042 Primary osteoarthritis, left hand: Secondary | ICD-10-CM | POA: Diagnosis not present

## 2015-05-02 DIAGNOSIS — M25562 Pain in left knee: Secondary | ICD-10-CM

## 2015-05-02 DIAGNOSIS — D649 Anemia, unspecified: Secondary | ICD-10-CM

## 2015-05-02 DIAGNOSIS — Z808 Family history of malignant neoplasm of other organs or systems: Secondary | ICD-10-CM

## 2015-05-02 DIAGNOSIS — E119 Type 2 diabetes mellitus without complications: Secondary | ICD-10-CM

## 2015-05-02 DIAGNOSIS — D509 Iron deficiency anemia, unspecified: Secondary | ICD-10-CM | POA: Diagnosis not present

## 2015-05-02 LAB — CBC WITH DIFFERENTIAL/PLATELET
BASOS ABS: 0.2 10*3/uL — AB (ref 0.0–0.1)
Basophils Relative: 3 % — ABNORMAL HIGH (ref 0–1)
Eosinophils Absolute: 0.1 10*3/uL (ref 0.0–0.7)
Eosinophils Relative: 2 % (ref 0–5)
HCT: 31.7 % — ABNORMAL LOW (ref 39.0–52.0)
Hemoglobin: 9.5 g/dL — ABNORMAL LOW (ref 13.0–17.0)
LYMPHS ABS: 1.4 10*3/uL (ref 0.7–4.0)
Lymphocytes Relative: 27 % (ref 12–46)
MCH: 21.8 pg — AB (ref 26.0–34.0)
MCHC: 30 g/dL (ref 30.0–36.0)
MCV: 72.9 fL — ABNORMAL LOW (ref 78.0–100.0)
MONO ABS: 0.4 10*3/uL (ref 0.1–1.0)
Monocytes Relative: 8 % (ref 3–12)
Neutro Abs: 3.1 10*3/uL (ref 1.7–7.7)
Neutrophils Relative %: 60 % (ref 43–77)
Platelets: 238 10*3/uL (ref 150–400)
RBC: 4.35 MIL/uL (ref 4.22–5.81)
RDW: 17 % — ABNORMAL HIGH (ref 11.5–15.5)
WBC: 5.2 10*3/uL (ref 4.0–10.5)

## 2015-05-02 LAB — IRON AND TIBC
Iron: 20 ug/dL — ABNORMAL LOW (ref 45–182)
SATURATION RATIOS: 5 % — AB (ref 17.9–39.5)
TIBC: 434 ug/dL (ref 250–450)
UIBC: 414 ug/dL

## 2015-05-02 LAB — COMPREHENSIVE METABOLIC PANEL
ALBUMIN: 4 g/dL (ref 3.5–5.0)
ALT: 47 U/L (ref 17–63)
ANION GAP: 12 (ref 5–15)
AST: 53 U/L — ABNORMAL HIGH (ref 15–41)
Alkaline Phosphatase: 70 U/L (ref 38–126)
BILIRUBIN TOTAL: 0.4 mg/dL (ref 0.3–1.2)
BUN: 10 mg/dL (ref 6–20)
CO2: 27 mmol/L (ref 22–32)
Calcium: 9.2 mg/dL (ref 8.9–10.3)
Chloride: 95 mmol/L — ABNORMAL LOW (ref 101–111)
Creatinine, Ser: 0.63 mg/dL (ref 0.61–1.24)
GFR calc non Af Amer: >60 mL/min (ref >60)
Glucose, Bld: 122 mg/dL — ABNORMAL HIGH (ref 65–99)
POTASSIUM: 4.4 mmol/L (ref 3.5–5.1)
SODIUM: 134 mmol/L — AB (ref 135–145)
Total Protein: 6.5 g/dL (ref 6.5–8.1)

## 2015-05-02 LAB — VITAMIN B12: Vitamin B-12: 291 pg/mL (ref 180–914)

## 2015-05-02 LAB — FOLATE: Folate: 26.9 ng/mL (ref >5.9)

## 2015-05-02 LAB — RETICULOCYTES
RBC.: 4.35 MIL/uL (ref 4.22–5.81)
RETIC COUNT ABSOLUTE: 82.7 10*3/uL (ref 19.0–186.0)
RETIC CT PCT: 1.9 % (ref 0.4–3.1)

## 2015-05-02 LAB — FERRITIN: FERRITIN: 8 ng/mL — AB (ref 24–336)

## 2015-05-02 LAB — LACTATE DEHYDROGENASE: LDH: 175 U/L (ref 98–192)

## 2015-05-02 LAB — MAGNESIUM: Magnesium: 1.5 mg/dL — ABNORMAL LOW (ref 1.7–2.4)

## 2015-05-02 LAB — TSH: TSH: 8.067 u[IU]/mL — ABNORMAL HIGH (ref 0.350–4.500)

## 2015-05-02 NOTE — Patient Instructions (Signed)
[place] at [facility] Discharge Instructions  RECOMMENDATIONS MADE BY THE CONSULTANT AND ANY TEST RESULTS WILL BE SENT TO YOUR REFERRING PHYSICIAN.  Exam and discussion today with Kirby Crigler, PA-C. Lab work today. Return as scheduled for follow-up office visit. Please call the clinic should you have any questions or concerns.   Thank you for choosing [place] at [facility] to provide your oncology and hematology care.  To afford each patient quality time with our provider, please arrive at least 15 minutes before your scheduled appointment time.    You need to re-schedule your appointment should you arrive 10 or more minutes late.  We strive to give you quality time with our providers, and arriving late affects you and other patients whose appointments are after yours.  Also, if you no show three or more times for appointments you may be dismissed from the clinic at the providers discretion.     Again, thank you for choosing [facility].  Our hope is that these requests will decrease the amount of time that you wait before being seen by our physicians.       _____________________________________________________________  Should you have questions after your visit to [facility], please contact our office at [phone] between the hours of 8:30 a.m. and 4:30 p.m.  Voicemails left after 4:30 p.m. will not be returned until the following business day.  For prescription refill requests, have your pharmacy contact our office.

## 2015-05-02 NOTE — Progress Notes (Unsigned)
LABS DRAWN

## 2015-05-02 NOTE — Progress Notes (Signed)
[facility] Hematology/Oncology Consultation   Name: Michael [name]      MRN: [id number]    Location: Room/bed info not found  Date: 05/05/2015 Time:7:20 AM   REFERRING PHYSICIAN:  Arther Abbott, MD  REASON FOR CONSULT:  Anemia   DIAGNOSIS:  Microcytic, hypochromic anemia  HISTORY OF PRESENT ILLNESS:   Michael [name] is a 69 year old white man with a past medical history significant for DM, spinal stenosis, PN, morbid obesity, NASH, hypothyroidism, hyperlipidemia, hepatomegaly, diastolic cardiac dysfunction, DDD and documented history of iron deficiency anemia who was referred to the [facility] for anemia in the pre-op setting with request for Procrit injections to increase Hgb prior to total knee arthroplasty.  Chart is reviewed.  Of note, the patient's Hgb was WNL until about Sept 2013 at which time he was noted to be anemic and persistently anemic from then on.  I personally reviewed and went over laboratory results with the patient.  The results are noted within this dictation.  After review of labs I note a ferritin of 7 in Sept 2015 when he was evaluated by GI.  EGD and Colonoscopy were recommended and on 07/21/2014, the patient underwent EGD/Colonoscopy.  EGD demonstrated inflammatory changes associated with NSAID use.  Colonoscopy was aborted due to bradycardia requiring 24 hour observation in the inpatient setting and work-up by cardiology.  Renal function is WNL.    He reports a history of anemia that has been worked-up by [facility].  He describes his anemia cause being from small bowel AVMs.  He reports a history of PRBC transfusion in [place], [place] for a Hgb of 4.5 g/dL.  He denies any blood in his stools or dark sticky stools.  He denies any B symptoms and he has actually gains weight during chart review, but I suspect most of that is from dependent edema.  He denies any Pica or ice cravings.     PAST MEDICAL HISTORY:   Past Medical History    Diagnosis Date  . COPD (chronic obstructive pulmonary disease)   . Type 2 diabetes mellitus   . Hyperlipidemia   . Essential hypertension   . Iron deficiency anemia   . NASH (nonalcoholic steatohepatitis)   . Diabetic neuropathy   . GERD (gastroesophageal reflux disease)   . Arthritis   . Depression   . Diastolic dysfunction   . Left knee DJD     ALLERGIES: No Known Allergies    MEDICATIONS: I have reviewed the patient's current medications.    Current Outpatient Prescriptions on File Prior to Visit  Medication Sig Dispense Refill  . ACCU-CHEK AVIVA PLUS test strip     . aspirin EC 81 MG tablet Take 81 mg by mouth daily.    . B-D INS SYRINGE 0.5CC/31GX5/16 31G X 5/16" 0.5 ML MISC     . benazepril (LOTENSIN) 40 MG tablet TAKE ONE TABLET BY MOUTH DAILY. 90 tablet 1  . bumetanide (BUMEX) 1 MG tablet Take 1 tablet (1 mg total) by mouth 2 (two) times daily. 60 tablet 3  . clobetasol cream (TEMOVATE) 8.45 % Apply 1 application topically 2 (two) times daily. 45 g 1  . diclofenac sodium (VOLTAREN) 1 % GEL Apply 4 g topically 4 (four) times daily. 5 Tube 5  . Ferrous Sulfate (IRON) 325 (65 FE) MG TABS Take 1 tablet by mouth daily.     Marland Kitchen gabapentin (NEURONTIN) 400 MG capsule TAKE ONE CAPSULE BY MOUTH  THREE TIMES DAILY. 90 capsule 2  . glipiZIDE (GLUCOTROL) 10 MG tablet TAKE ONE TABLET BY MOUTH TWICE DAILY BEFORE MEALS. 180 tablet 1  . Insulin Glargine (LANTUS) 100 UNIT/ML Solostar Pen Inject 30 Units into the skin at bedtime. 15 mL 11  . meloxicam (MOBIC) 15 MG tablet Take 15 mg by mouth daily.     . metFORMIN (GLUCOPHAGE) 1000 MG tablet TAKE ONE TABLET BY MOUTH TWICE DAILY WITH A MEAL. 60 tablet 6  . naproxen (NAPROSYN) 375 MG tablet     . nebivolol (BYSTOLIC) 10 MG tablet Take 1 tablet (10 mg total) by mouth daily. 30 tablet 3  . nystatin cream (MYCOSTATIN) Apply 1 application topically 2 (two) times daily. 45 g 1  . omeprazole (PRILOSEC) 20 MG capsule TAKE ONE CAPSULE BY MOUTH TWICE  DAILY. 60 capsule 11  . oxyCODONE-acetaminophen (PERCOCET) 7.5-325 MG per tablet Take 1 tablet by mouth every 6 (six) hours as needed for severe pain. 90 tablet 0  . potassium chloride (KLOR-CON M10) 10 MEQ tablet TAKE 1 TABLET (10 MEQ TOTAL) BY MOUTH twice a day with fluid pill (Patient taking differently: Take 10 mEq by mouth 2 (two) times daily. TAKE 1 TABLET (10 MEQ TOTAL) BY MOUTH twice a day with fluid pill) 60 tablet 3  . pravastatin (PRAVACHOL) 40 MG tablet Take 1 tablet (40 mg total) by mouth every evening. 90 tablet 1   No current facility-administered medications on file prior to visit.     PAST SURGICAL HISTORY Past Surgical History  Procedure Laterality Date  . Hernia repair    . Shoulder surgery      Rght-rotator cuff  . Lipoma removal      Stomach  . Knee arthroscopy with medial menisectomy Left 11/06/2012    Procedure: KNEE ARTHROSCOPY WITH MEDIAL MENISECTOMY;  Surgeon: Carole Civil, MD;  Location: [facility];  Service: Orthopedics;  Laterality: Left;  . Lumbar laminectomy/decompression microdiscectomy Left 08/30/2013    Procedure: LUMBAR LAMINECTOMY/DECOMPRESSION MICRODISCECTOMY LEFT  LUMBAR TWO THREE;  Surgeon: Otilio Connors, MD;  Location: [place] [facility];  Service: Neurosurgery;  Laterality: Left;  . Egd with enteroscopy  2011    [facility]: normal esophagus and stomach. Normal duodenum, jejunum. No evidence of AVMs.   . Colonoscopy  2011    [facility]: normal colon, normal distal ileum  . Colonoscopy with propofol N/A 07/21/2014    Procedure: ATTEMPTED COLONOSCOPY WITH PROPOFOL-HAD TO STOP DUE TO BRADYCARDIA;  Surgeon: Daneil Dolin, MD;  Location: [facility];  Service: Endoscopy;  Laterality: N/A;  . Esophagogastroduodenoscopy (egd) with propofol N/A 07/21/2014    Procedure: ESOPHAGOGASTRODUODENOSCOPY (EGD) WITH PROPOFOL;  Surgeon: Daneil Dolin, MD;  Location: [facility];  Service: Endoscopy;  Laterality: N/A;  . Esophageal biopsy N/A 07/21/2014    Procedure: BIOPSY;   Surgeon: Daneil Dolin, MD;  Location: [facility];  Service: Endoscopy;  Laterality: N/A;    FAMILY HISTORY: Family History  Problem Relation Age of Onset  . Heart disease Mother   . Hyperlipidemia Mother   . Hypertension Mother   . Depression Mother   . Diabetes Mother   . Heart disease Father   . Hypertension Father   . Hyperlipidemia Father   . Diabetes Father   . Heart disease Sister   . Hyperlipidemia Sister   . Hypertension Sister   . Diabetes Sister   . Diabetes Brother   . Heart disease Sister   . Hyperlipidemia Sister   . Hypertension Sister   .  Heart disease Sister   . Hyperlipidemia Sister   . Hypertension Sister   . Diabetes Sister   . Diabetes Brother   . Rectal cancer Mother     SOCIAL HISTORY:  reports that he has quit smoking. His smoking use included Cigarettes. He has a 110 pack-year smoking history. He quit smokeless tobacco use about 15 years ago. He reports that he does not drink alcohol or use illicit drugs.  PERFORMANCE STATUS: The patient's performance status is 2 - Symptomatic, <50% confined to bed  PHYSICAL EXAM: Most Recent Vital Signs: Blood pressure 136/66, pulse 77, temperature 98.5 F (36.9 C), temperature source Oral, resp. rate 18, height 4' 11"  (1.499 m), weight 222 lb (100.699 kg), SpO2 96 %. General appearance: alert, cooperative, no distress, morbidly obese and accompanied by friend, Faith Head: Normocephalic, without obvious abnormality, atraumatic Eyes: negative findings: lids and lashes normal, conjunctivae and sclerae normal, corneas clear and pupils equal, round, reactive to light and accomodation Neck: no adenopathy and supple, symmetrical, trachea midline Lungs: clear to auscultation bilaterally and normal percussion bilaterally Heart: regular rate and rhythm, S1, S2 normal, no murmur, click, rub or gallop Abdomen: normal findings: bowel sounds normal and soft, non-tender and abnormal findings:  obese Extremities: edema dependent  bilaterally 3+ pitting, osteoarthritis of fingers, particularly at the DIPs. Skin: Skin color, texture, turgor normal. No rashes or lesions Lymph nodes: Cervical, supraclavicular, and axillary nodes normal. Neurologic: Grossly normal  LABORATORY DATA:  CBC    Component Value Date/Time   WBC 5.2 05/02/2015 1330   RBC 4.35 05/02/2015 1330   RBC 4.35 05/02/2015 1330   HGB 9.5* 05/02/2015 1330   HCT 31.7* 05/02/2015 1330   PLT 238 05/02/2015 1330   MCV 72.9* 05/02/2015 1330   MCH 21.8* 05/02/2015 1330   MCHC 30.0 05/02/2015 1330   RDW 17.0* 05/02/2015 1330   LYMPHSABS 1.4 05/02/2015 1330   MONOABS 0.4 05/02/2015 1330   EOSABS 0.1 05/02/2015 1330   BASOSABS 0.2* 05/02/2015 1330    Lab Results  Component Value Date   IRON 20* 05/02/2015   TIBC 434 05/02/2015   FERRITIN 8* 05/02/2015     Chemistry      Component Value Date/Time   NA 134* 05/02/2015 1330   K 4.4 05/02/2015 1330   CL 95* 05/02/2015 1330   CO2 27 05/02/2015 1330   BUN 10 05/02/2015 1330   CREATININE 0.63 05/02/2015 1330   CREATININE 0.70 03/01/2015 1245      Component Value Date/Time   CALCIUM 9.2 05/02/2015 1330   ALKPHOS 70 05/02/2015 1330   AST 53* 05/02/2015 1330   ALT 47 05/02/2015 1330   BILITOT 0.4 05/02/2015 1330        PATHOLOGY:  None   ASSESSMENT:  1. Microcytic, hypochromic anemia, one ferrous sulfate 325 mg with poor compliance. 2. Left knee pain, causing debility 3. LE edema, dependent 4. Osteoarthritis of fingers, particularly DIP  Patient Active Problem List   Diagnosis Date Noted  . Diastolic dysfunction 29/56/2130  . Venous stasis dermatitis 04/20/2015  . Stage I pressure sore 02/13/2015  . Atypical nevi 01/18/2015  . Intertriginous candidiasis 12/23/2014  . Hypothyroidism 11/30/2014  . Bradycardia 07/21/2014  . Sinus pause 07/21/2014  . Liver fibrosis 06/29/2014  . IDA (iron deficiency anemia) 06/29/2014  . Anemia, unspecified 05/31/2014  . Hepatomegaly 05/25/2014  .  Difficulty in walking(719.7) 05/03/2014  . Knee pain, chronic 05/03/2014  . Stiffness of joint, not elsewhere classified, pelvic region and thigh 05/03/2014  .  Dermatitis 04/12/2014  . HNP (herniated nucleus pulposus), lumbar 08/30/2013  . Anemia 07/14/2013  . Spinal stenosis of lumbar region 07/06/2013  . Carpal tunnel syndrome 04/13/2013  . Peripheral edema 04/13/2013  . Status post arthroscopy of left knee 04/13/2013  . DDD (degenerative disc disease), lumbosacral 04/13/2013  . Peripheral neuropathy 01/05/2013  . S/P arthroscopy of left knee 11/09/2012  . Osteoarthritis of left knee 10/27/2012  . Meniscus tear 10/07/2012  . Patellar tendonitis 07/01/2012  . NASH (nonalcoholic steatohepatitis) 05/22/2007  . Type II diabetes mellitus with neurological manifestations 05/20/2007  . Hyperlipidemia 10/15/2006  . OBESITY, MORBID 10/15/2006  . Essential hypertension 10/15/2006  . GERD 10/15/2006  . ARTHRITIS 10/15/2006     PLAN:  1. Chart reviewed 2. I personally reviewed and went over laboratory results with the patient.  The results are noted within this dictation. 3. I personally reviewed and went over radiographic studies with the patient.  The results are noted within this dictation.   4. Incomplete anemia work-up.  Now patient is planning for total knee arthoplasty by Dr. Aline Brochure and this is on hold due to anemia.  It is requested that we perform Procrit injections in the pre-operative setting, but anemia needs to be worked-up prior to ESA therapy since the patient has no signs of chronic renal disease and chronic diseases associated with anemia of chronic disease. 5. Labs today: CBC diff, CMET, anemia panel, retic count, Haptoglobin, EPO level, LDH, MM panel, and peripheral smear for pathology review.  Will add Mg and TSH for Dr. Harl Bowie to save the patient an additional venipuncture.  Additionally, TSH will help [place] for anemia work-up. 6. Stool cards x 3 7. I suspect iron  deficiency anemia given his microcytosis and hypochromicity with a documented ferritin of 7 in September 2015. Also known history of AVMS' per the patient's report.   If this is the case, Iron replacement should be first line of therapy and if normal marrow function would expect a brisk response to adequate iron replacement.  EGD is reviewed from September 2015 and is noted to demonstrate inflammatory changes associated with NSAID use.  Procrit is indicated in the perioperative setting with the goal of a hemoglobin of 10- 13 g/dL WITH prophylactic anticoagulation ) 300 units/kg/day for 15 days total, beginning 10 days prior to surgery, on the day of surgery, and for 4 days after surgery or 600 units/kg once weekly for 4 doses given on day 21-, 14-, 7- days prior to surgery and on the day of surgery. Once Iron is replaced, if no other obvious cause for anemia is found and patient remains anemic will initiate procrit as above. Additional anemia eval can then occur postoperatively. 7. Return next week for review of lab results and intervention as indicated.    All questions were answered. The patient knows to call the clinic with any problems, questions or concerns. We can certainly see the patient much sooner if necessary.  This note is electronically signed by: Molli Hazard, MD 05/05/2015 7:20 AM

## 2015-05-03 ENCOUNTER — Other Ambulatory Visit (HOSPITAL_COMMUNITY): Payer: Self-pay | Admitting: Oncology

## 2015-05-03 ENCOUNTER — Other Ambulatory Visit: Payer: Self-pay | Admitting: *Deleted

## 2015-05-03 ENCOUNTER — Ambulatory Visit (HOSPITAL_COMMUNITY)
Admission: RE | Admit: 2015-05-03 | Discharge: 2015-05-03 | Disposition: A | Discharge status: Home / Self Care | Payer: Medicare Other | Source: Ambulatory Visit | Attending: Cardiology | Admitting: Cardiology

## 2015-05-03 DIAGNOSIS — R0602 Shortness of breath: Secondary | ICD-10-CM | POA: Diagnosis not present

## 2015-05-03 DIAGNOSIS — D509 Iron deficiency anemia, unspecified: Secondary | ICD-10-CM

## 2015-05-03 LAB — PATHOLOGIST SMEAR REVIEW

## 2015-05-03 LAB — KAPPA/LAMBDA LIGHT CHAINS
Kappa free light chain: 20.1 mg/L — ABNORMAL HIGH (ref 3.30–19.40)
Kappa, lambda light chain ratio: 1.18 (ref 0.26–1.65)
Lambda free light chains: 17.07 mg/L (ref 5.71–26.30)

## 2015-05-03 LAB — ERYTHROPOIETIN: ERYTHROPOIETIN: 170.4 m[IU]/mL — AB (ref 2.6–18.5)

## 2015-05-03 LAB — HAPTOGLOBIN: Haptoglobin: 151 mg/dL (ref 34–200)

## 2015-05-03 MED ORDER — LEVOTHYROXINE SODIUM 50 MCG PO TABS
50.0000 ug | ORAL_TABLET | Freq: Every day | ORAL | Status: DC
Start: 2015-05-03 — End: 2015-10-09

## 2015-05-04 ENCOUNTER — Telehealth: Payer: Self-pay | Admitting: *Deleted

## 2015-05-04 LAB — MULTIPLE MYELOMA PANEL, SERUM
ALBUMIN SERPL ELPH-MCNC: 3.7 g/dL (ref 2.9–4.4)
ALBUMIN/GLOB SERPL: 1.5 (ref 0.7–1.7)
Alpha 1: 0.2 g/dL (ref 0.0–0.4)
Alpha2 Glob SerPl Elph-Mcnc: 0.8 g/dL (ref 0.4–1.0)
B-GLOBULIN SERPL ELPH-MCNC: 1.1 g/dL (ref 0.7–1.3)
GAMMA GLOB SERPL ELPH-MCNC: 0.4 g/dL (ref 0.4–1.8)
Globulin, Total: 2.5 g/dL (ref 2.2–3.9)
IGA: 158 mg/dL (ref 61–437)
IgG (Immunoglobin G), Serum: 473 mg/dL — ABNORMAL LOW (ref 700–1600)
IgM, Serum: 38 mg/dL (ref 20–172)
Total Protein ELP: 6.2 g/dL (ref 6.0–8.5)

## 2015-05-04 MED ORDER — METOLAZONE 5 MG PO TABS: ORAL_TABLET | ORAL | Status: DC

## 2015-05-04 NOTE — Telephone Encounter (Signed)
Patient notified and voiced understanding of instructions

## 2015-05-04 NOTE — Telephone Encounter (Signed)
-----   Message from [name], MD sent at 05/04/2015 10:00 AM EDT ----- Arloa Koh write him a prescription for metolazone 49m q week prn swelling. He already took one dose this week, I'd like him to take another tomorrow.   JZandra AbtsMD ----- Message -----    From: LLevonne Hubert LPN    Sent: 84/02/478  9:51 AM      To: JArnoldo Lenis MD  Patient states that the swelling he had at last visit has not improved.

## 2015-05-05 ENCOUNTER — Encounter (HOSPITAL_BASED_OUTPATIENT_CLINIC_OR_DEPARTMENT_OTHER): Payer: Medicare Other

## 2015-05-05 VITALS — BP 137/79 | HR 84 | Temp 98.0°F | Resp 18

## 2015-05-05 DIAGNOSIS — D509 Iron deficiency anemia, unspecified: Secondary | ICD-10-CM

## 2015-05-05 DIAGNOSIS — D649 Anemia, unspecified: Secondary | ICD-10-CM

## 2015-05-05 MED ORDER — SODIUM CHLORIDE 0.9 % IJ SOLN
10.0000 mL | Freq: Once | INTRAMUSCULAR | Status: AC
  Administered 2015-05-05: 10 mL via INTRAVENOUS

## 2015-05-05 MED ORDER — SODIUM CHLORIDE 0.9 % IV SOLN
510.0000 mg | Freq: Once | INTRAVENOUS | Status: AC
  Administered 2015-05-05: 510 mg via INTRAVENOUS
  Filled 2015-05-05: qty 17

## 2015-05-05 MED ORDER — SODIUM CHLORIDE 0.9 % IV SOLN
INTRAVENOUS | Status: DC
  Administered 2015-05-05: 09:00:00 via INTRAVENOUS

## 2015-05-05 NOTE — Progress Notes (Signed)
Tolerated iron infusion well.

## 2015-05-05 NOTE — Patient Instructions (Signed)
[place] at [facility] Discharge Instructions  RECOMMENDATIONS MADE BY THE CONSULTANT AND ANY TEST RESULTS WILL BE SENT TO YOUR REFERRING PHYSICIAN.  Today you received an iron infusion of Feraheme 510 mg. Return as scheduled.  Thank you for choosing [place] at [facility] to provide your oncology and hematology care.  To afford each patient quality time with our provider, please arrive at least 15 minutes before your scheduled appointment time.    You need to re-schedule your appointment should you arrive 10 or more minutes late.  We strive to give you quality time with our providers, and arriving late affects you and other patients whose appointments are after yours.  Also, if you no show three or more times for appointments you may be dismissed from the clinic at the providers discretion.     Again, thank you for choosing [facility].  Our hope is that these requests will decrease the amount of time that you wait before being seen by our physicians.       _____________________________________________________________  Should you have questions after your visit to [facility], please contact our office at [phone] between the hours of 8:30 a.m. and 4:30 p.m.  Voicemails left after 4:30 p.m. will not be returned until the following business day.  For prescription refill requests, have your pharmacy contact our office.

## 2015-05-07 DIAGNOSIS — L89312 Pressure ulcer of right buttock, stage 2: Secondary | ICD-10-CM | POA: Diagnosis not present

## 2015-05-08 ENCOUNTER — Ambulatory Visit (INDEPENDENT_AMBULATORY_CARE_PROVIDER_SITE_OTHER): Payer: Medicare Other | Admitting: Adult Health

## 2015-05-08 ENCOUNTER — Encounter: Payer: Self-pay | Admitting: Adult Health

## 2015-05-08 VITALS — BP 124/62 | HR 82 | Ht 59.0 in | Wt 216.4 lb

## 2015-05-08 DIAGNOSIS — Z79899 Other long term (current) drug therapy: Secondary | ICD-10-CM | POA: Diagnosis not present

## 2015-05-08 MED ORDER — BUMETANIDE 1 MG PO TABS: ORAL_TABLET | ORAL | Status: DC

## 2015-05-08 MED ORDER — POTASSIUM CHLORIDE ER 10 MEQ PO TBCR: EXTENDED_RELEASE_TABLET | ORAL | Status: DC

## 2015-05-08 NOTE — Progress Notes (Deleted)
Name: Michael [name]    DOB: 1946/06/18  Age: 69 y.o.  MR#: [id number]       PCP:  Vic Blackbird, MD      Insurance: Payor: [employer] MEDICARE / Plan: [facility] MEDICARE / Product Type: *No Product type* /   CC:    Chief Complaint  Patient presents with  . Leg Swelling  . Hypertension    VS Filed Vitals:   05/08/15 1517  BP: 124/62  Pulse: 82  Height: 4' 11"  (1.499 m)  Weight: 216 lb 6.4 oz (98.158 kg)  SpO2: 97%    Weights Current Weight  05/08/15 216 lb 6.4 oz (98.158 kg)  05/02/15 222 lb (100.699 kg)  05/01/15 224 lb (101.606 kg)    Blood Pressure  BP Readings from Last 3 Encounters:  05/08/15 124/62  05/05/15 137/79  05/02/15 136/66     Admit date:  (Not on file) Last encounter with RMR:  Visit date not found   Allergy Review of patient's allergies indicates no known allergies.  Current Outpatient Prescriptions  Medication Sig Dispense Refill  . ACCU-CHEK AVIVA PLUS test strip     . aspirin EC 81 MG tablet Take 81 mg by mouth daily.    . B-D INS SYRINGE 0.5CC/31GX5/16 31G X 5/16" 0.5 ML MISC     . benazepril (LOTENSIN) 40 MG tablet TAKE ONE TABLET BY MOUTH DAILY. 90 tablet 1  . bumetanide (BUMEX) 1 MG tablet Take 1 tablet (1 mg total) by mouth 2 (two) times daily. 60 tablet 3  . clobetasol cream (TEMOVATE) 0.62 % Apply 1 application topically 2 (two) times daily. 45 g 1  . diclofenac sodium (VOLTAREN) 1 % GEL Apply 4 g topically 4 (four) times daily. 5 Tube 5  . Ferrous Sulfate (IRON) 325 (65 FE) MG TABS Take 1 tablet by mouth daily.     Marland Kitchen gabapentin (NEURONTIN) 400 MG capsule TAKE ONE CAPSULE BY MOUTH THREE TIMES DAILY. 90 capsule 2  . glipiZIDE (GLUCOTROL) 10 MG tablet TAKE ONE TABLET BY MOUTH TWICE DAILY BEFORE MEALS. 180 tablet 1  . Insulin Glargine (LANTUS) 100 UNIT/ML Solostar Pen Inject 30 Units into the skin at bedtime. 15 mL 11  . levothyroxine (SYNTHROID, LEVOTHROID) 50 MCG tablet Take 1 tablet (50 mcg total) by mouth daily before breakfast. 30  tablet 1  . meloxicam (MOBIC) 15 MG tablet Take 15 mg by mouth daily.     . metFORMIN (GLUCOPHAGE) 1000 MG tablet TAKE ONE TABLET BY MOUTH TWICE DAILY WITH A MEAL. 60 tablet 6  . metolazone (ZAROXOLYN) 5 MG tablet TAKE ONE TABLET BY MOUTH WEEKLY AS NEEDED FOR SWELLING 5 tablet 6  . naproxen (NAPROSYN) 375 MG tablet     . nebivolol (BYSTOLIC) 10 MG tablet Take 1 tablet (10 mg total) by mouth daily. 30 tablet 3  . nystatin cream (MYCOSTATIN) Apply 1 application topically 2 (two) times daily. 45 g 1  . omeprazole (PRILOSEC) 20 MG capsule TAKE ONE CAPSULE BY MOUTH TWICE DAILY. 60 capsule 11  . oxyCODONE-acetaminophen (PERCOCET) 7.5-325 MG per tablet Take 1 tablet by mouth every 6 (six) hours as needed for severe pain. 90 tablet 0  . potassium chloride (KLOR-CON M10) 10 MEQ tablet TAKE 1 TABLET (10 MEQ TOTAL) BY MOUTH twice a day with fluid pill (Patient taking differently: Take 10 mEq by mouth 2 (two) times daily. TAKE 1 TABLET (10 MEQ TOTAL) BY MOUTH twice a day with fluid pill) 60 tablet 3  . pravastatin (PRAVACHOL) 40  MG tablet Take 1 tablet (40 mg total) by mouth every evening. 90 tablet 1   No current facility-administered medications for this visit.    Discontinued Meds:   There are no discontinued medications.  Patient Active Problem List   Diagnosis Date Noted  . Diastolic dysfunction 35/57/3220  . Venous stasis dermatitis 04/20/2015  . Stage I pressure sore 02/13/2015  . Atypical nevi 01/18/2015  . Intertriginous candidiasis 12/23/2014  . Hypothyroidism 11/30/2014  . Bradycardia 07/21/2014  . Sinus pause 07/21/2014  . Liver fibrosis 06/29/2014  . IDA (iron deficiency anemia) 06/29/2014  . Anemia, unspecified 05/31/2014  . Hepatomegaly 05/25/2014  . Difficulty in walking(719.7) 05/03/2014  . Knee pain, chronic 05/03/2014  . Stiffness of joint, not elsewhere classified, pelvic region and thigh 05/03/2014  . Dermatitis 04/12/2014  . HNP (herniated nucleus pulposus), lumbar  08/30/2013  . Anemia 07/14/2013  . Spinal stenosis of lumbar region 07/06/2013  . Carpal tunnel syndrome 04/13/2013  . Peripheral edema 04/13/2013  . Status post arthroscopy of left knee 04/13/2013  . DDD (degenerative disc disease), lumbosacral 04/13/2013  . Peripheral neuropathy 01/05/2013  . S/P arthroscopy of left knee 11/09/2012  . Osteoarthritis of left knee 10/27/2012  . Meniscus tear 10/07/2012  . Patellar tendonitis 07/01/2012  . NASH (nonalcoholic steatohepatitis) 05/22/2007  . Type II diabetes mellitus with neurological manifestations 05/20/2007  . Hyperlipidemia 10/15/2006  . OBESITY, MORBID 10/15/2006  . Essential hypertension 10/15/2006  . GERD 10/15/2006  . ARTHRITIS 10/15/2006    LABS    Component Value Date/Time   NA 134* 05/02/2015 1330   NA 136 04/04/2015 1015   NA 134* 03/01/2015 1245   K 4.4 05/02/2015 1330   K 3.9 04/04/2015 1015   K 4.4 03/01/2015 1245   CL 95* 05/02/2015 1330   CL 101 04/04/2015 1015   CL 96 03/01/2015 1245   CO2 27 05/02/2015 1330   CO2 25 04/04/2015 1015   CO2 23 03/01/2015 1245   GLUCOSE 122* 05/02/2015 1330   GLUCOSE 147* 04/04/2015 1015   GLUCOSE 194* 03/01/2015 1245   BUN 10 05/02/2015 1330   BUN 11 04/04/2015 1015   BUN 9 03/01/2015 1245   CREATININE 0.63 05/02/2015 1330   CREATININE 0.62 04/04/2015 1015   CREATININE 0.70 03/01/2015 1245   CREATININE 0.59 01/18/2015 0847   CREATININE 0.69 10/12/2014 0849   CREATININE 0.78 07/21/2014 1505   CALCIUM 9.2 05/02/2015 1330   CALCIUM 9.0 04/04/2015 1015   CALCIUM 9.3 03/01/2015 1245   GFRNONAA >60 05/02/2015 1330   GFRNONAA >60 04/04/2015 1015   GFRNONAA >90 07/21/2014 1505   GFRAA >60 05/02/2015 1330   GFRAA >60 04/04/2015 1015   GFRAA >90 07/21/2014 1505   CMP     Component Value Date/Time   NA 134* 05/02/2015 1330   K 4.4 05/02/2015 1330   CL 95* 05/02/2015 1330   CO2 27 05/02/2015 1330   GLUCOSE 122* 05/02/2015 1330   BUN 10 05/02/2015 1330   CREATININE  0.63 05/02/2015 1330   CREATININE 0.70 03/01/2015 1245   CALCIUM 9.2 05/02/2015 1330   PROT 6.5 05/02/2015 1330   ALBUMIN 4.0 05/02/2015 1330   AST 53* 05/02/2015 1330   ALT 47 05/02/2015 1330   ALKPHOS 70 05/02/2015 1330   BILITOT 0.4 05/02/2015 1330   GFRNONAA >60 05/02/2015 1330   GFRAA >60 05/02/2015 1330       Component Value Date/Time   WBC 5.2 05/02/2015 1330   WBC 4.8 04/04/2015 1015   WBC 5.7  01/18/2015 0847   HGB 9.5* 05/02/2015 1330   HGB 9.7* 04/04/2015 1015   HGB 10.6* 01/18/2015 0847   HCT 31.7* 05/02/2015 1330   HCT 31.9* 04/04/2015 1015   HCT 33.2* 01/18/2015 0847   MCV 72.9* 05/02/2015 1330   MCV 73.8* 04/04/2015 1015   MCV 71.2* 01/18/2015 0847    Lipid Panel     Component Value Date/Time   CHOL 132 10/12/2014 0849   TRIG 108 10/12/2014 0849   HDL 42 10/12/2014 0849   CHOLHDL 3.1 10/12/2014 0849   VLDL 22 10/12/2014 0849   LDLCALC 68 10/12/2014 0849    ABG No results found for: PHART, PCO2ART, PO2ART, HCO3, TCO2, ACIDBASEDEF, O2SAT   Lab Results  Component Value Date   TSH 8.067* 05/02/2015   BNP (last 3 results) No results for input(s): BNP in the last 8760 hours.  ProBNP (last 3 results) No results for input(s): PROBNP in the last 8760 hours.  Cardiac Panel (last 3 results) No results for input(s): CKTOTAL, CKMB, TROPONINI, RELINDX in the last 72 hours.  Iron/TIBC/Ferritin/ %Sat    Component Value Date/Time   IRON 20* 05/02/2015 1331   TIBC 434 05/02/2015 1331   FERRITIN 8* 05/02/2015 1331   IRONPCTSAT 5* 05/02/2015 1331   IRONPCTSAT 6* 04/13/2013 0846     EKG Orders placed or performed in visit on 04/04/15  . EKG 12-Lead  . EKG 12-Lead     Prior Assessment and Plan Problem List as of 05/08/2015      Cardiovascular and Mediastinum   Essential hypertension   Last Assessment & Plan 03/01/2015 Office Visit Written 03/02/2015 10:40 AM by [name], MD    BP tolerating Diuretic      Sinus pause     Digestive   GERD    NASH (nonalcoholic steatohepatitis)   Last Assessment & Plan 10/12/2014 Office Visit Written 10/12/2014  1:35 PM by [name], MD    Recheck liver function as well as cholesterol level from recent hospitalization      Hepatomegaly   Last Assessment & Plan 05/25/2014 Office Visit Written 05/31/2014  5:12 PM by Orvil Feil, NP    On exam. Known fatty liver. Proceed with elastography.       Liver fibrosis   Last Assessment & Plan 06/29/2014 Office Visit Written 06/29/2014  2:31 PM by Orvil Feil, NP    elastography ordered due to hepatomegaly. Metavir score of F4. Obtain viral markers. LFTs normal. Likely secondary to NASH. EGD already planned due to IDA. Needs [place] every 6 months and hepatitis vaccinations. Prescription provided.         Endocrine   Type II diabetes mellitus with neurological manifestations   Last Assessment & Plan 04/24/2015 Office Visit Written 04/24/2015  1:57 PM by [name], MD    A1c in office today 6.7% showing very good control of his diabetes to change her current regimen      Hypothyroidism   Last Assessment & Plan 11/30/2014 Office Visit Written 11/30/2014  9:13 PM by [name], MD    Recheck TFT on synthroid, titrate as needed        Nervous and Auditory   Peripheral neuropathy   Last Assessment & Plan 10/12/2014 Office Visit Written 10/12/2014  1:36 PM by [name], MD    Continue gabapentin he will also make a follow-up with podiatry for nail trimming and foot care      Carpal tunnel syndrome   Last  Assessment & Plan 04/13/2013 Office Visit Written 04/13/2013  9:29 AM by [name], MD    Diagnoses given by neurology he'll be referred to a new neurologist for second opinion and conduction studies        Musculoskeletal and Integument   ARTHRITIS   Patellar tendonitis   Meniscus tear   Last Assessment & Plan 02/08/2014 Office Visit Written 02/08/2014  4:47 PM by [name], MD    Status post arthroscopic  will forward  previous notes      Osteoarthritis of left knee   Last Assessment & Plan 02/13/2015 Office Visit Written 02/13/2015  5:18 PM by [name], MD    Change pain meds to perococet 7.41m TID #90 given      DDD (degenerative disc disease), lumbosacral   HNP (herniated nucleus pulposus), lumbar   Dermatitis   Last Assessment & Plan 04/12/2014 Office Visit Written 04/12/2014  5:22 PM by KAlycia Rossetti MD    I will give him clobetasol to apply to the lesions in his scalp after washing possible this is psoriasis related he does not have any significant plaques therefore difficult to tell.      Atypical nevi   Last Assessment & Plan 01/18/2015 Office Visit Written 01/18/2015  8:50 AM by KAlycia Rossetti MD    Mole on face enlarging in size, needs removal Also has lesion on lower eyelid that needs evaluation Referral to dermatology      Stage I pressure sore   Last Assessment & Plan 03/01/2015 Office Visit Written 03/02/2015 10:41 AM by KAlycia Rossetti MD    Improved pressure sore, will continue to dress for next week or 2, then this should resolve as long as he tries to move around and not sit all day      Venous stasis dermatitis   Last Assessment & Plan 04/24/2015 Office Visit Written 04/24/2015  1:56 PM by KAlycia Rossetti MD    Clobetasol topical twice a day        Other   Hyperlipidemia   Last Assessment & Plan 01/18/2015 Office Visit Written 01/18/2015  8:48 AM by KAlycia Rossetti MD    cholesteorl at goal, continue statin drug      OBESITY, MORBID   Last Assessment & Plan 01/05/2013 Office Visit Written 01/05/2013  8:53 AM by KAlycia Rossetti MD    He is being more active with watching his diet, decreased mobility      S/P arthroscopy of left knee   Peripheral edema   Last Assessment & Plan 04/24/2015 Office Visit Written 04/24/2015  1:58 PM by KAlycia Rossetti MD    Chronic peripheral edema with some mild diastolic dysfunction noted on echo performed in 2014. He is tried and  failed Lasix currently on higher dose torsemide and he is urinating very well however there is no change in the edema. The edema is prohibiting him from having his knee surgery. I will change him to Bumex and I'm also going to consult cardiology. We have tried getting compression hose however due to his dimensions they're finding difficulty getting stockings for him. We've also tried compression wraps for the past month but these did not help      Status post arthroscopy of left knee   Spinal stenosis of lumbar region   Last Assessment & Plan 07/14/2013 Office Visit Written 07/14/2013  5:46 PM by KAlycia Rossetti MD    Reviewed MRI and orthopedic note. We'll  await neurosurgery appointment. We'll continue him on the gabapentin      Anemia   Last Assessment & Plan 11/19/2013 Office Visit Written 11/21/2013  8:30 AM by [name], MD    Recheck CBC , he wants to hold on GI at this time, we will send pending labs      Difficulty in walking(719.7)   Knee pain, chronic   Stiffness of joint, not elsewhere classified, pelvic region and thigh   Anemia, unspecified   Last Assessment & Plan 05/25/2014 Office Visit Written 05/31/2014  5:12 PM by Orvil Feil, NP    69 year old with history of IDA and reportedly an extensive work-up in [place] a few years ago. No overt signs of GI bleeding; need outside reports from the [place]. As of note, mother with history of rectal cancer. Check CBC, iron, ferritin now. Further recommendations after review of outside records. Will likely need TCS/EGD in near future.       IDA (iron deficiency anemia)   Last Assessment & Plan 06/29/2014 Office Visit Edited 06/29/2014  2:30 PM by Orvil Feil, NP    69 year old male with IDA, prior work-up in 2011 at [facility] with negative EGD/enteroscopy and colonoscopy. Patient reports capsule study done at this time but no records available. No overt signs of GI bleeding or concerning GI symptoms. On Mobic AND naprosyn, which could  cause insult anywhere in the GI tract. Mother with history of rectal cancer. Prior history of failed sedation.  Proceed with TCS/EGD with Dr. Gala Romney in near future: the risks, benefits, and alternatives have been discussed with the patient in detail. The patient states understanding and desires to proceed. PROPOFOL for sedation Limit naprosyn and Mobic May ultimately need capsule study Increase iron to BID      Bradycardia   Intertriginous candidiasis   Diastolic dysfunction       Imaging: No results found.

## 2015-05-08 NOTE — Progress Notes (Signed)
Cardiology Office Note   Date:  05/08/2015   ID:  Aniruddh, Ciavarella July 02, 1946, MRN [id number]  PCP:  Vic Blackbird, MD  Cardiologist:  Cloria Spring, NP   Chief Complaint  Patient presents with  . Leg Swelling  . Hypertension      History of Present Illness: ROALD LUKACS is a 69 y.o. male who presents for ongoing assessment and management of hypertension, with history of COPD, diabetes, hyperlipidemia.  Most recent echocardiogram dated 05/03/2015 demonstrated normal LVEF of 60-65% with grade II diastolic dysfunction, no change from prior echo.  The patient was given metolazone and was to continue Bumex.    Left ventricle: The cavity size was normal. Wall thickness was increased in a pattern of mild LVH. Systolic function was normal. The estimated ejection fraction was in the range of 60% to 65%. Wall motion was normal; there were no regional wall motion abnormalities. Features are consistent with a pseudonormal left ventricular filling pattern, with concomitant abnormal relaxation and increased filling pressure (grade 2 diastolic dysfunction). Doppler parameters are consistent with high ventricular filling pressure. - Aortic valve: Mildly calcified annulus. - Mitral valve: Mildly calcified annulus. There was trivial regurgitation. - Left atrium: The atrium was mildly to moderately dilated.  He states he is feeling much better. Has lost 6 lbs, and can fit into his shoes and socks again. However, he is still eating salty foods. He states that he has stopped adding salt to his food except when he eats tomato sandwiches.  He still sleeps in a recliner as it is much more comfortable for him. He is medically complaint.   Past Medical History  Diagnosis Date  . COPD (chronic obstructive pulmonary disease)   . Type 2 diabetes mellitus   . Hyperlipidemia   . Essential hypertension   . Iron deficiency anemia   . NASH (nonalcoholic steatohepatitis)    . Diabetic neuropathy   . GERD (gastroesophageal reflux disease)   . Arthritis   . Depression   . Diastolic dysfunction   . Left knee DJD     Past Surgical History  Procedure Laterality Date  . Hernia repair    . Shoulder surgery      Rght-rotator cuff  . Lipoma removal      Stomach  . Knee arthroscopy with medial menisectomy Left 11/06/2012    Procedure: KNEE ARTHROSCOPY WITH MEDIAL MENISECTOMY;  Surgeon: Carole Civil, MD;  Location: [facility];  Service: Orthopedics;  Laterality: Left;  . Lumbar laminectomy/decompression microdiscectomy Left 08/30/2013    Procedure: LUMBAR LAMINECTOMY/DECOMPRESSION MICRODISCECTOMY LEFT  LUMBAR TWO THREE;  Surgeon: Otilio Connors, MD;  Location: [place] [facility];  Service: Neurosurgery;  Laterality: Left;  . Egd with enteroscopy  2011    [facility]: normal esophagus and stomach. Normal duodenum, jejunum. No evidence of AVMs.   . Colonoscopy  2011    [facility]: normal colon, normal distal ileum  . Colonoscopy with propofol N/A 07/21/2014    Procedure: ATTEMPTED COLONOSCOPY WITH PROPOFOL-HAD TO STOP DUE TO BRADYCARDIA;  Surgeon: Daneil Dolin, MD;  Location: [facility];  Service: Endoscopy;  Laterality: N/A;  . Esophagogastroduodenoscopy (egd) with propofol N/A 07/21/2014    Procedure: ESOPHAGOGASTRODUODENOSCOPY (EGD) WITH PROPOFOL;  Surgeon: Daneil Dolin, MD;  Location: [facility];  Service: Endoscopy;  Laterality: N/A;  . Esophageal biopsy N/A 07/21/2014    Procedure: BIOPSY;  Surgeon: Daneil Dolin, MD;  Location: [facility];  Service: Endoscopy;  Laterality: N/A;  Current Outpatient Prescriptions  Medication Sig Dispense Refill  . ACCU-CHEK AVIVA PLUS test strip     . aspirin EC 81 MG tablet Take 81 mg by mouth daily.    . B-D INS SYRINGE 0.5CC/31GX5/16 31G X 5/16" 0.5 ML MISC     . benazepril (LOTENSIN) 40 MG tablet TAKE ONE TABLET BY MOUTH DAILY. 90 tablet 1  . bumetanide (BUMEX) 1 MG tablet Take 1 tablet (1 mg total) by mouth 2 (two) times daily.  60 tablet 3  . clobetasol cream (TEMOVATE) 9.41 % Apply 1 application topically 2 (two) times daily. 45 g 1  . diclofenac sodium (VOLTAREN) 1 % GEL Apply 4 g topically 4 (four) times daily. 5 Tube 5  . Ferrous Sulfate (IRON) 325 (65 FE) MG TABS Take 1 tablet by mouth daily.     Marland Kitchen gabapentin (NEURONTIN) 400 MG capsule TAKE ONE CAPSULE BY MOUTH THREE TIMES DAILY. 90 capsule 2  . glipiZIDE (GLUCOTROL) 10 MG tablet TAKE ONE TABLET BY MOUTH TWICE DAILY BEFORE MEALS. 180 tablet 1  . Insulin Glargine (LANTUS) 100 UNIT/ML Solostar Pen Inject 30 Units into the skin at bedtime. 15 mL 11  . levothyroxine (SYNTHROID, LEVOTHROID) 50 MCG tablet Take 1 tablet (50 mcg total) by mouth daily before breakfast. 30 tablet 1  . meloxicam (MOBIC) 15 MG tablet Take 15 mg by mouth daily.     . metFORMIN (GLUCOPHAGE) 1000 MG tablet TAKE ONE TABLET BY MOUTH TWICE DAILY WITH A MEAL. 60 tablet 6  . metolazone (ZAROXOLYN) 5 MG tablet TAKE ONE TABLET BY MOUTH WEEKLY AS NEEDED FOR SWELLING 5 tablet 6  . naproxen (NAPROSYN) 375 MG tablet     . nebivolol (BYSTOLIC) 10 MG tablet Take 1 tablet (10 mg total) by mouth daily. 30 tablet 3  . nystatin cream (MYCOSTATIN) Apply 1 application topically 2 (two) times daily. 45 g 1  . omeprazole (PRILOSEC) 20 MG capsule TAKE ONE CAPSULE BY MOUTH TWICE DAILY. 60 capsule 11  . oxyCODONE-acetaminophen (PERCOCET) 7.5-325 MG per tablet Take 1 tablet by mouth every 6 (six) hours as needed for severe pain. 90 tablet 0  . potassium chloride (KLOR-CON M10) 10 MEQ tablet TAKE 1 TABLET (10 MEQ TOTAL) BY MOUTH twice a day with fluid pill (Patient taking differently: Take 10 mEq by mouth 2 (two) times daily. TAKE 1 TABLET (10 MEQ TOTAL) BY MOUTH twice a day with fluid pill) 60 tablet 3  . pravastatin (PRAVACHOL) 40 MG tablet Take 1 tablet (40 mg total) by mouth every evening. 90 tablet 1   No current facility-administered medications for this visit.    Allergies:   Review of patient's allergies  indicates no known allergies.    Social History:  The patient  reports that he quit smoking about 15 years ago. His smoking use included Cigarettes. He has a 110 pack-year smoking history. He quit smokeless tobacco use about 15 years ago. He reports that he does not drink alcohol or use illicit drugs.   Family History:  The patient's family history includes Depression in his mother; Diabetes in his brother, brother, father, mother, sister, and sister; Heart disease in his father, mother, sister, sister, and sister; Hyperlipidemia in his father, mother, sister, sister, and sister; Hypertension in his father, mother, sister, sister, and sister; Rectal cancer in his mother.    ROS: .   All other systems are reviewed and negative.Unless otherwise mentioned in  H&P above.   PHYSICAL EXAM: VS:  BP 124/62 mmHg  Pulse  82  Ht 4' 11"  (1.499 m)  Wt 216 lb 6.4 oz (98.158 kg)  BMI 43.68 kg/m2  SpO2 97% , BMI Body mass index is 43.68 kg/(m^2). GEN: Well nourished, well developed, in no acute distress HEENT: normal Neck: no JVD, carotid bruits, or masses Cardiac: RRR soft S3.; no murmurs, rubs, or gallops 2+-3+ pitting pretibial  Edema, but better according to the patient.  Respiratory:  clear to auscultation bilaterally, normal work of breathing GI: soft, nontender, nondistended, + BS MS: no deformity or atrophy Skin: warm and dry, no rash Neuro:  Strength and sensation are intact Psych: euthymic mood, full affect  Recent Labs: 05/02/2015: ALT 47; BUN 10; Creatinine, Ser 0.63; Hemoglobin 9.5*; Magnesium 1.5*; Platelets 238; Potassium 4.4; Sodium 134*; TSH 8.067*    Lipid Panel    Component Value Date/Time   CHOL 132 10/12/2014 0849   TRIG 108 10/12/2014 0849   HDL 42 10/12/2014 0849   CHOLHDL 3.1 10/12/2014 0849   VLDL 22 10/12/2014 0849   LDLCALC 68 10/12/2014 0849      Wt Readings from Last 3 Encounters:  05/08/15 216 lb 6.4 oz (98.158 kg)  05/02/15 222 lb (100.699 kg)  05/01/15 224  lb (101.606 kg)      Other studies Reviewed: Additional studies/ records that were reviewed today include: None Review of the above records demonstrates: None   ASSESSMENT AND PLAN:  1. Chronic Diastolic Heart Failure:  He continues to have fluid overload with 2+-3+ pretibial edema, which he states is significantly better per patient.  I will icnrease his Bumex to 2 mg in am and continue 1 mg in afternoon. He will continue metolazone weekly. He states his lowest wt was 201 lbs, making him 15 lbs heavier but 6 lbs less than he was on last week's visit.  He will go up on his potassium to 2 tablets (20 mEq total). I will order repeat labs in one week. He will be seen in a week. He is counseled on low sodium diet. He states he will put his salt shaker away. Will try to keep him out of the hospital If he continues to be edematous, will need to admit.   2.Anemia: Iron deficiency. Receives iron transfusions from oncology. Last Hgb was 9.5 on 05/02/2015. Uncertain if this is from moderate CHF vs iron deficiency. Will see him again in one week with labs as discussed above. LFT's are reviewed with mildly elevated AST noted at 53.  Will monitor with increased dose of Bumex.   3. Hypertension: BP is currently controlled. May need to decrease lisinopril if BP drops with further diureses. Will follow up closely.    Current medicines are reviewed at length with the patient today.    Labs/ tests ordered today include: CBC and BMET No orders of the defined types were placed in this encounter.     Disposition:   FU with me or DR. BRANCH in one week.  Signed, Jory Sims, NP  05/08/2015 3:19 PM    [facility] [address], [place], [place] [address] Phone: [phone]; Fax: [phone]

## 2015-05-08 NOTE — Patient Instructions (Addendum)
Your physician recommends that you schedule a follow-up appointment in: 1 month   Your physician recommends that you return for lab work in: 1 week - CMET , CBC  Increase your Bumex to 2 mg in the AM (continue to take 1 mg in the PM)  Increase your Potassium to 2 tablets in the AM (continue taking 1 tablet in the PM)  Thank you for choosing [facility]!

## 2015-05-10 ENCOUNTER — Ambulatory Visit (HOSPITAL_COMMUNITY): Payer: Medicare Other | Admitting: Oncology

## 2015-05-12 ENCOUNTER — Encounter (HOSPITAL_COMMUNITY): Payer: Medicare Other

## 2015-05-12 ENCOUNTER — Encounter (HOSPITAL_BASED_OUTPATIENT_CLINIC_OR_DEPARTMENT_OTHER): Payer: Medicare Other

## 2015-05-12 ENCOUNTER — Encounter (HOSPITAL_BASED_OUTPATIENT_CLINIC_OR_DEPARTMENT_OTHER): Payer: Medicare Other | Admitting: Oncology

## 2015-05-12 VITALS — BP 140/60 | HR 77 | Temp 98.3°F | Resp 20

## 2015-05-12 VITALS — BP 123/60 | HR 68 | Temp 98.3°F | Resp 20

## 2015-05-12 DIAGNOSIS — K7400 Hepatic fibrosis, unspecified: Secondary | ICD-10-CM

## 2015-05-12 DIAGNOSIS — D509 Iron deficiency anemia, unspecified: Secondary | ICD-10-CM

## 2015-05-12 DIAGNOSIS — E038 Other specified hypothyroidism: Secondary | ICD-10-CM

## 2015-05-12 DIAGNOSIS — D649 Anemia, unspecified: Secondary | ICD-10-CM | POA: Diagnosis not present

## 2015-05-12 LAB — COMPREHENSIVE METABOLIC PANEL
ALK PHOS: 66 U/L (ref 38–126)
ALT: 62 U/L (ref 17–63)
AST: 54 U/L — AB (ref 15–41)
Albumin: 4 g/dL (ref 3.5–5.0)
Anion gap: 9 (ref 5–15)
BUN: 9 mg/dL (ref 6–20)
CALCIUM: 9 mg/dL (ref 8.9–10.3)
CHLORIDE: 97 mmol/L — AB (ref 101–111)
CO2: 26 mmol/L (ref 22–32)
Creatinine, Ser: 0.63 mg/dL (ref 0.61–1.24)
GFR calc Af Amer: >60 mL/min (ref >60)
GLUCOSE: 110 mg/dL — AB (ref 65–99)
POTASSIUM: 4.5 mmol/L (ref 3.5–5.1)
Sodium: 132 mmol/L — ABNORMAL LOW (ref 135–145)
Total Bilirubin: 0.3 mg/dL (ref 0.3–1.2)
Total Protein: 6.6 g/dL (ref 6.5–8.1)

## 2015-05-12 LAB — CBC WITH DIFFERENTIAL/PLATELET
Basophils Absolute: 0.1 10*3/uL (ref 0.0–0.1)
Basophils Relative: 2 % — ABNORMAL HIGH (ref 0–1)
EOS ABS: 0.1 10*3/uL (ref 0.0–0.7)
EOS PCT: 2 % (ref 0–5)
HEMATOCRIT: 33.4 % — AB (ref 39.0–52.0)
HEMOGLOBIN: 10.3 g/dL — AB (ref 13.0–17.0)
LYMPHS PCT: 27 % (ref 12–46)
Lymphs Abs: 1.8 10*3/uL (ref 0.7–4.0)
MCH: 23 pg — ABNORMAL LOW (ref 26.0–34.0)
MCHC: 30.8 g/dL (ref 30.0–36.0)
MCV: 74.6 fL — ABNORMAL LOW (ref 78.0–100.0)
Monocytes Absolute: 0.3 10*3/uL (ref 0.1–1.0)
Monocytes Relative: 5 % (ref 3–12)
Neutro Abs: 4.2 10*3/uL (ref 1.7–7.7)
Neutrophils Relative %: 64 % (ref 43–77)
Platelets: 230 10*3/uL (ref 150–400)
RBC: 4.48 MIL/uL (ref 4.22–5.81)
RDW: 18.2 % — AB (ref 11.5–15.5)
WBC: 6.5 10*3/uL (ref 4.0–10.5)

## 2015-05-12 LAB — MAGNESIUM: Magnesium: 1.5 mg/dL — ABNORMAL LOW (ref 1.7–2.4)

## 2015-05-12 LAB — TSH: TSH: 7.591 u[IU]/mL — ABNORMAL HIGH (ref 0.350–4.500)

## 2015-05-12 MED ORDER — SODIUM CHLORIDE 0.9 % IV SOLN
INTRAVENOUS | Status: DC
  Administered 2015-05-12: 14:00:00 via INTRAVENOUS

## 2015-05-12 MED ORDER — SODIUM CHLORIDE 0.9 % IV SOLN
510.0000 mg | Freq: Once | INTRAVENOUS | Status: AC
  Administered 2015-05-12: 510 mg via INTRAVENOUS
  Filled 2015-05-12: qty 17

## 2015-05-12 NOTE — Progress Notes (Signed)
Michael [name] Tolerated iron infusion well discharged via wheelchair

## 2015-05-12 NOTE — Patient Instructions (Signed)
[place] at [facility] Discharge Instructions  RECOMMENDATIONS MADE BY THE CONSULTANT AND ANY TEST RESULTS WILL BE SENT TO YOUR REFERRING PHYSICIAN.  IV iron infusion  Please follow up as scheduled Call the clinic if you have any questions or concerns  Thank you for choosing [place] at [facility] to provide your oncology and hematology care.  To afford each patient quality time with our provider, please arrive at least 15 minutes before your scheduled appointment time.    You need to re-schedule your appointment should you arrive 10 or more minutes late.  We strive to give you quality time with our providers, and arriving late affects you and other patients whose appointments are after yours.  Also, if you no show three or more times for appointments you may be dismissed from the clinic at the providers discretion.     Again, thank you for choosing [facility]  Our hope is that these requests will decrease the amount of time that you wait before being seen by our physicians.       _____________________________________________________________  Should you have questions after your visit to [facility], please contact our office at [phone] between the hours of 8:30 a.m. and 4:30 p.m.  Voicemails left after 4:30 p.m. will not be returned until the following business day.  For prescription refill requests, have your pharmacy contact our office.

## 2015-05-12 NOTE — Assessment & Plan Note (Addendum)
Iron deficiency anemia  Patient was referred to [place] from his orthopod, Dr. Aline Brochure, when he was noted to be anemic as part of the pre-op work-up.    Work-up reveals a microcytic, hypochromic anemia with a single-digit ferritin.  As a result, his iron deficit was calculated to be around 900.  Therefore, he was started on IV feraheme, 510 mg, on 05/05/2015 and he is receiving his second dose today, 05/12/2015.  Will repeat labs in 1 week and if Hgb is improved, will let Dr. Aline Brochure know so surgery can be planned again.  Labs in 6 weeks: cbc diff, ferritin  Return in 6 weeks for follow-up.

## 2015-05-12 NOTE — Progress Notes (Signed)
Michael Blackbird, MD [address] [place] [address]  Iron deficiency anemia - Plan: CBC, CBC with Differential, Ferritin  CURRENT THERAPY: S/P IV Feraheme 510 mg on 05/04/2014 with plans to administer a second dose today (05/12/2015).  INTERVAL HISTORY: Michael [name] 69 y.o. male returns for followup of iron deficiency anemia.  I personally reviewed and went over laboratory results with the patient.  The results are noted within this dictation.  He is educated regarding iron deficiency anemia.  Iron deficiency anemia is the most common anemia.  Beside playing a critical role as an oxygen carrier in the heme group of hemoglobin, iron is found in many key proteins in the cells, such as cytochromes and myoglobin, so it is not unexpected that a lack of iron has effects other than anemia.  Three studies have focused on nonanemic iron deficiency leading to fatigue.  Two studies showed that oral iron supplementation reduces fatigue, with no significant change in hemoglobin levels, in women with a ferritin level of less than 50 ng/mL, and a third study showed a lessening of fatigue with parental iron administration in women with a ferritin level of 15 ng/mL or less or an iron saturation of 20% or less.   Owing to obligate iron loss through menses, women are at greater risk for iron deficiency than men.  Iron loss in all women averages 1-3 ng per day, and dietary intake is often inadequate to maintain a positive iron balance.  A 1967 study showed that 25% of healthy, college-age women had no bone marrow iron stores and that another 33% had low stores.  Pregnancy adds to demands for iron, with requirements increasing to 6 ng per day by the end of pregnancy.  Athletes are another group at risk for iron deficiency.  Gastrointestinal tract blood is the source of iron loss, and exercise-induced hemolysis leads to urinary iron losses.  Decreased absorption of iron has also been implicated  as a cause of iron deficiency, because of levels of hepcidin are often elevated in athletes owing to training-induced inflammation.    Obesity and its surgical treatment are also at risk factors for iron deficiency.  Obese patients are often iron-deficient, with increased hepcidin level being implicated in decreased absorption.  After bariatric surgery, the incidence of iron deficiency can be as high as 50%.  Because the main site of iron absorption is the duodenum, surgeries that involve bypassing this part of the bowel are associated with an increased incidence of iron deficiency.  However, iron deficiency is seen as a sequela of most types of bariatric surgery.    -NEJM Volume 371, No 14, pg 1325-1326   We will replace his IV iron with a second dose of IV Feraheme today.  I suspect within 1-2 weeks, his Hgb will be appropriate for surgery and will defer timing of that to Dr. Aline Brochure.    Past Medical History  Diagnosis Date  . COPD (chronic obstructive pulmonary disease)   . Type 2 diabetes mellitus   . Hyperlipidemia   . Essential hypertension   . Iron deficiency anemia   . NASH (nonalcoholic steatohepatitis)   . Diabetic neuropathy   . GERD (gastroesophageal reflux disease)   . Arthritis   . Depression   . Diastolic dysfunction   . Left knee DJD     has Type II diabetes mellitus with neurological manifestations; Hyperlipidemia; OBESITY, MORBID; Essential hypertension; GERD; ARTHRITIS; NASH (nonalcoholic steatohepatitis); Patellar tendonitis;  Meniscus tear; Osteoarthritis of left knee; S/P arthroscopy of left knee; Peripheral neuropathy; Carpal tunnel syndrome; Peripheral edema; Status post arthroscopy of left knee; DDD (degenerative disc disease), lumbosacral; Spinal stenosis of lumbar region; Anemia; HNP (herniated nucleus pulposus), lumbar; Dermatitis; Difficulty in walking(719.7); Knee pain, chronic; Stiffness of joint, not elsewhere classified, pelvic region and thigh;  Hepatomegaly; Iron deficiency anemia; Liver fibrosis; IDA (iron deficiency anemia); Bradycardia; Sinus pause; Hypothyroidism; Intertriginous candidiasis; Atypical nevi; Stage I pressure sore; Venous stasis dermatitis; and Diastolic dysfunction on his problem list.     has No Known Allergies.  Current Outpatient Prescriptions on File Prior to Visit  Medication Sig Dispense Refill  . ACCU-CHEK AVIVA PLUS test strip     . aspirin EC 81 MG tablet Take 81 mg by mouth daily.    . B-D INS SYRINGE 0.5CC/31GX5/16 31G X 5/16" 0.5 ML MISC     . benazepril (LOTENSIN) 40 MG tablet TAKE ONE TABLET BY MOUTH DAILY. 90 tablet 1  . bumetanide (BUMEX) 1 MG tablet Take 2 mg in the AM / continue to take 1 mg in the PM 75 tablet 3  . clobetasol cream (TEMOVATE) 9.32 % Apply 1 application topically 2 (two) times daily. 45 g 1  . diclofenac sodium (VOLTAREN) 1 % GEL Apply 4 g topically 4 (four) times daily. 5 Tube 5  . Ferrous Sulfate (IRON) 325 (65 FE) MG TABS Take 1 tablet by mouth daily.     Marland Kitchen gabapentin (NEURONTIN) 400 MG capsule TAKE ONE CAPSULE BY MOUTH THREE TIMES DAILY. 90 capsule 2  . glipiZIDE (GLUCOTROL) 10 MG tablet TAKE ONE TABLET BY MOUTH TWICE DAILY BEFORE MEALS. 180 tablet 1  . Insulin Glargine (LANTUS) 100 UNIT/ML Solostar Pen Inject 30 Units into the skin at bedtime. 15 mL 11  . levothyroxine (SYNTHROID, LEVOTHROID) 50 MCG tablet Take 1 tablet (50 mcg total) by mouth daily before breakfast. 30 tablet 1  . meloxicam (MOBIC) 15 MG tablet Take 15 mg by mouth daily.     . metFORMIN (GLUCOPHAGE) 1000 MG tablet TAKE ONE TABLET BY MOUTH TWICE DAILY WITH A MEAL. 60 tablet 6  . metolazone (ZAROXOLYN) 5 MG tablet TAKE ONE TABLET BY MOUTH WEEKLY AS NEEDED FOR SWELLING 5 tablet 6  . naproxen (NAPROSYN) 375 MG tablet     . nebivolol (BYSTOLIC) 10 MG tablet Take 1 tablet (10 mg total) by mouth daily. 30 tablet 3  . nystatin cream (MYCOSTATIN) Apply 1 application topically 2 (two) times daily. 45 g 1  .  omeprazole (PRILOSEC) 20 MG capsule TAKE ONE CAPSULE BY MOUTH TWICE DAILY. 60 capsule 11  . oxyCODONE-acetaminophen (PERCOCET) 7.5-325 MG per tablet Take 1 tablet by mouth every 6 (six) hours as needed for severe pain. 90 tablet 0  . potassium chloride (K-DUR) 10 MEQ tablet Take 2 tablets in the AM / Take 1 tablet in the PM 90 tablet 3  . pravastatin (PRAVACHOL) 40 MG tablet Take 1 tablet (40 mg total) by mouth every evening. 90 tablet 1   No current facility-administered medications on file prior to visit.    Past Surgical History  Procedure Laterality Date  . Hernia repair    . Shoulder surgery      Rght-rotator cuff  . Lipoma removal      Stomach  . Knee arthroscopy with medial menisectomy Left 11/06/2012    Procedure: KNEE ARTHROSCOPY WITH MEDIAL MENISECTOMY;  Surgeon: Carole Civil, MD;  Location: [facility];  Service: Orthopedics;  Laterality: Left;  .  Lumbar laminectomy/decompression microdiscectomy Left 08/30/2013    Procedure: LUMBAR LAMINECTOMY/DECOMPRESSION MICRODISCECTOMY LEFT  LUMBAR TWO THREE;  Surgeon: Otilio Connors, MD;  Location: [facility];  Service: Neurosurgery;  Laterality: Left;  . Egd with enteroscopy  2011    [facility]: normal esophagus and stomach. Normal duodenum, jejunum. No evidence of AVMs.   . Colonoscopy  2011    [facility]: normal colon, normal distal ileum  . Colonoscopy with propofol N/A 07/21/2014    Procedure: ATTEMPTED COLONOSCOPY WITH PROPOFOL-HAD TO STOP DUE TO BRADYCARDIA;  Surgeon: Daneil Dolin, MD;  Location: [facility];  Service: Endoscopy;  Laterality: N/A;  . Esophagogastroduodenoscopy (egd) with propofol N/A 07/21/2014    Procedure: ESOPHAGOGASTRODUODENOSCOPY (EGD) WITH PROPOFOL;  Surgeon: Daneil Dolin, MD;  Location: [facility];  Service: Endoscopy;  Laterality: N/A;  . Esophageal biopsy N/A 07/21/2014    Procedure: BIOPSY;  Surgeon: Daneil Dolin, MD;  Location: [facility];  Service: Endoscopy;  Laterality: N/A;    Denies any headaches,  dizziness, double vision, fevers, chills, night sweats, nausea, vomiting, diarrhea, constipation, chest pain, heart palpitations, shortness of breath, blood in stool, black tarry stool, urinary pain, urinary burning, urinary frequency, hematuria.   PHYSICAL EXAMINATION  ECOG PERFORMANCE STATUS: 2 - Symptomatic, <50% confined to bed  There were no vitals filed for this visit.  GENERAL:alert, no distress, well developed, comfortable, cooperative, obese, smiling and unaccompanied today, in chemo-recliner for IV Feraheme. SKIN: skin color, texture, turgor are normal, no rashes or significant lesions HEAD: Normocephalic, No masses, lesions, tenderness or abnormalities EYES: normal, PERRLA, EOMI, Conjunctiva are pink and non-injected EARS: External ears normal OROPHARYNX:lips, buccal mucosa, and tongue normal and mucous membranes are moist  NECK: supple, no adenopathy, thyroid normal size, non-tender, without nodularity, no stridor, non-tender, trachea midline LYMPH:  no palpable lymphadenopathy BREAST:not examined LUNGS: clear to auscultation  HEART: regular rate & rhythm ABDOMEN:abdomen soft, non-tender, obese and normal bowel sounds BACK: Back symmetric, no curvature. EXTREMITIES:less then 2 second capillary refill, no joint deformities, effusion, or inflammation, no skin discoloration, positive findings:  edema B/L LE edema, 2+ pitting.  NEURO: alert & oriented x 3 with fluent speech, no focal motor/sensory deficits   LABORATORY DATA: CBC    Component Value Date/Time   WBC 5.2 05/02/2015 1330   RBC 4.35 05/02/2015 1330   RBC 4.35 05/02/2015 1330   HGB 9.5* 05/02/2015 1330   HCT 31.7* 05/02/2015 1330   PLT 238 05/02/2015 1330   MCV 72.9* 05/02/2015 1330   MCH 21.8* 05/02/2015 1330   MCHC 30.0 05/02/2015 1330   RDW 17.0* 05/02/2015 1330   LYMPHSABS 1.4 05/02/2015 1330   MONOABS 0.4 05/02/2015 1330   EOSABS 0.1 05/02/2015 1330   BASOSABS 0.2* 05/02/2015 1330      Chemistry       Component Value Date/Time   NA 134* 05/02/2015 1330   K 4.4 05/02/2015 1330   CL 95* 05/02/2015 1330   CO2 27 05/02/2015 1330   BUN 10 05/02/2015 1330   CREATININE 0.63 05/02/2015 1330   CREATININE 0.70 03/01/2015 1245      Component Value Date/Time   CALCIUM 9.2 05/02/2015 1330   ALKPHOS 70 05/02/2015 1330   AST 53* 05/02/2015 1330   ALT 47 05/02/2015 1330   BILITOT 0.4 05/02/2015 1330      Lab Results  Component Value Date   IRON 20* 05/02/2015   TIBC 434 05/02/2015   FERRITIN 8* 05/02/2015   Lab Results  Component Value Date  VXYIAXKP53 291 05/02/2015   Lab Results  Component Value Date   FOLATE 26.9 05/02/2015   Lab Results  Component Value Date   PROT 6.5 05/02/2015   IGGSERUM 473* 05/02/2015   IGA 158 05/02/2015   IGMSERUM 38 05/02/2015   KPAFRELGTCHN 20.10* 05/02/2015   LAMBDASER 17.07 05/02/2015   KAPLAMBRATIO 1.18 05/02/2015     PENDING LABS:   RADIOGRAPHIC STUDIES:  No results found.   PATHOLOGY:    ASSESSMENT AND PLAN:  Iron deficiency anemia Iron deficiency anemia  Patient was referred to [place] from his orthopod, Dr. Aline Brochure, when he was noted to be anemic as part of the pre-op work-up.    Work-up reveals a microcytic, hypochromic anemia with a single-digit ferritin.  As a result, his iron deficit was calculated to be around 900.  Therefore, he was started on IV feraheme, 510 mg, on 05/05/2015 and he is receiving his second dose today, 05/12/2015.  Will repeat labs in 1 week and if Hgb is improved, will let Dr. Aline Brochure know so surgery can be planned again.  Labs in 6 weeks: cbc diff, ferritin  Return in 6 weeks for follow-up.   THERAPY PLAN:  IV Feraheme today as planned to complete a total dose of 1020 mg.  I suspect his Hgb will be appropriate for surgery within 2 weeks.  When his Hgb is improved, we will contact Dr. Ruthe Mannan office accordingly.  All questions were answered. The patient knows to call the clinic with any  problems, questions or concerns. We can certainly see the patient much sooner if necessary.  Patient and plan discussed with Dr. Ancil Linsey and she is in agreement with the aforementioned.   This note is electronically signed by: Doy Mince 05/12/2015 12:57 PM

## 2015-05-12 NOTE — Addendum Note (Signed)
Addended by: Gerhard Perches on: 05/12/2015 02:22 PM   Modules accepted: Medications

## 2015-05-12 NOTE — Patient Instructions (Signed)
[name] at [facility] Discharge Instructions  RECOMMENDATIONS MADE BY THE CONSULTANT AND ANY TEST RESULTS WILL BE SENT TO YOUR REFERRING PHYSICIAN.  Labs in 1 week .  If HGB looks good, we will not do labs next week and [name] will call Dr. Aline Brochure  Labs in 6 weeks and to see [name] in 5 weeks.  Thank you for choosing [place] at [facility] to provide your oncology and hematology care.  To afford each patient quality time with our provider, please arrive at least 15 minutes before your scheduled appointment time.    You need to re-schedule your appointment should you arrive 10 or more minutes late.  We strive to give you quality time with our providers, and arriving late affects you and other patients whose appointments are after yours.  Also, if you no show three or more times for appointments you may be dismissed from the clinic at the providers discretion.     Again, thank you for choosing [facility].  Our hope is that these requests will decrease the amount of time that you wait before being seen by our physicians.       _____________________________________________________________  Should you have questions after your visit to [facility], please contact our office at [phone] between the hours of 8:30 a.m. and 4:30 p.m.  Voicemails left after 4:30 p.m. will not be returned until the following business day.  For prescription refill requests, have your pharmacy contact our office.

## 2015-05-16 ENCOUNTER — Ambulatory Visit (INDEPENDENT_AMBULATORY_CARE_PROVIDER_SITE_OTHER): Payer: Medicare Other | Admitting: Adult Health

## 2015-05-16 ENCOUNTER — Encounter: Payer: Self-pay | Admitting: Adult Health

## 2015-05-16 VITALS — BP 124/58 | HR 77 | Ht 59.0 in | Wt 216.0 lb

## 2015-05-16 DIAGNOSIS — I10 Essential (primary) hypertension: Secondary | ICD-10-CM | POA: Diagnosis not present

## 2015-05-16 DIAGNOSIS — I5032 Chronic diastolic (congestive) heart failure: Secondary | ICD-10-CM

## 2015-05-16 NOTE — Progress Notes (Deleted)
Name: Michael [name]    DOB: 09/12/1946  Age: 69 y.o.  MR#: [id number]       PCP:  Vic Blackbird, MD      Insurance: Payor: [employer] MEDICARE / Plan: [facility] MEDICARE / Product Type: *No Product type* /   CC:    Chief Complaint  Patient presents with  . Hypertension  . Edema    VS Filed Vitals:   05/16/15 1320  BP: 124/58  Pulse: 77  Height: 4' 11"  (1.499 m)  Weight: 216 lb (97.977 kg)  SpO2: 98%    Weights Current Weight  05/16/15 216 lb (97.977 kg)  05/08/15 216 lb 6.4 oz (98.158 kg)  05/02/15 222 lb (100.699 kg)    Blood Pressure  BP Readings from Last 3 Encounters:  05/16/15 124/58  05/12/15 140/60  05/12/15 123/60     Admit date:  (Not on file) Last encounter with RMR:  05/08/2015   Allergy Review of patient's allergies indicates no known allergies.  Current Outpatient Prescriptions  Medication Sig Dispense Refill  . ACCU-CHEK AVIVA PLUS test strip     . aspirin EC 81 MG tablet Take 81 mg by mouth daily.    . B-D INS SYRINGE 0.5CC/31GX5/16 31G X 5/16" 0.5 ML MISC     . benazepril (LOTENSIN) 40 MG tablet TAKE ONE TABLET BY MOUTH DAILY. 90 tablet 1  . bumetanide (BUMEX) 1 MG tablet Take 2 mg in the AM / continue to take 1 mg in the PM 75 tablet 3  . clobetasol cream (TEMOVATE) 6.29 % Apply 1 application topically 2 (two) times daily. 45 g 1  . diclofenac sodium (VOLTAREN) 1 % GEL Apply 4 g topically 4 (four) times daily. 5 Tube 5  . Ferrous Sulfate (IRON) 325 (65 FE) MG TABS Take 1 tablet by mouth daily.     Marland Kitchen gabapentin (NEURONTIN) 400 MG capsule TAKE ONE CAPSULE BY MOUTH THREE TIMES DAILY. 90 capsule 2  . glipiZIDE (GLUCOTROL) 10 MG tablet TAKE ONE TABLET BY MOUTH TWICE DAILY BEFORE MEALS. 180 tablet 1  . Insulin Glargine (LANTUS) 100 UNIT/ML Solostar Pen Inject 30 Units into the skin at bedtime. 15 mL 11  . levothyroxine (SYNTHROID, LEVOTHROID) 50 MCG tablet Take 1 tablet (50 mcg total) by mouth daily before breakfast. 30 tablet 1  . meloxicam (MOBIC) 15  MG tablet Take 15 mg by mouth daily.     . metFORMIN (GLUCOPHAGE) 1000 MG tablet TAKE ONE TABLET BY MOUTH TWICE DAILY WITH A MEAL. 60 tablet 6  . metolazone (ZAROXOLYN) 5 MG tablet TAKE ONE TABLET BY MOUTH WEEKLY AS NEEDED FOR SWELLING 5 tablet 6  . naproxen (NAPROSYN) 375 MG tablet     . nebivolol (BYSTOLIC) 10 MG tablet Take 1 tablet (10 mg total) by mouth daily. 30 tablet 3  . nystatin cream (MYCOSTATIN) Apply 1 application topically 2 (two) times daily. 45 g 1  . omeprazole (PRILOSEC) 20 MG capsule TAKE ONE CAPSULE BY MOUTH TWICE DAILY. 60 capsule 11  . oxyCODONE-acetaminophen (PERCOCET) 7.5-325 MG per tablet Take 1 tablet by mouth every 6 (six) hours as needed for severe pain. 90 tablet 0  . potassium chloride (K-DUR) 10 MEQ tablet Take 2 tablets in the AM / Take 1 tablet in the PM 90 tablet 3  . pravastatin (PRAVACHOL) 40 MG tablet Take 1 tablet (40 mg total) by mouth every evening. 90 tablet 1   No current facility-administered medications for this visit.    Discontinued Meds:  There are no discontinued medications.  Patient Active Problem List   Diagnosis Date Noted  . Diastolic dysfunction 25/42/7062  . Venous stasis dermatitis 04/20/2015  . Stage I pressure sore 02/13/2015  . Atypical nevi 01/18/2015  . Intertriginous candidiasis 12/23/2014  . Hypothyroidism 11/30/2014  . Bradycardia 07/21/2014  . Sinus pause 07/21/2014  . Liver fibrosis 06/29/2014  . IDA (iron deficiency anemia) 06/29/2014  . Iron deficiency anemia 05/31/2014  . Hepatomegaly 05/25/2014  . Difficulty in walking(719.7) 05/03/2014  . Knee pain, chronic 05/03/2014  . Stiffness of joint, not elsewhere classified, pelvic region and thigh 05/03/2014  . Dermatitis 04/12/2014  . HNP (herniated nucleus pulposus), lumbar 08/30/2013  . Anemia 07/14/2013  . Spinal stenosis of lumbar region 07/06/2013  . Carpal tunnel syndrome 04/13/2013  . Peripheral edema 04/13/2013  . Status post arthroscopy of left knee  04/13/2013  . DDD (degenerative disc disease), lumbosacral 04/13/2013  . Peripheral neuropathy 01/05/2013  . S/P arthroscopy of left knee 11/09/2012  . Osteoarthritis of left knee 10/27/2012  . Meniscus tear 10/07/2012  . Patellar tendonitis 07/01/2012  . NASH (nonalcoholic steatohepatitis) 05/22/2007  . Type II diabetes mellitus with neurological manifestations 05/20/2007  . Hyperlipidemia 10/15/2006  . OBESITY, MORBID 10/15/2006  . Essential hypertension 10/15/2006  . GERD 10/15/2006  . ARTHRITIS 10/15/2006    LABS    Component Value Date/Time   NA 132* 05/12/2015 1355   NA 134* 05/02/2015 1330   NA 136 04/04/2015 1015   K 4.5 05/12/2015 1355   K 4.4 05/02/2015 1330   K 3.9 04/04/2015 1015   CL 97* 05/12/2015 1355   CL 95* 05/02/2015 1330   CL 101 04/04/2015 1015   CO2 26 05/12/2015 1355   CO2 27 05/02/2015 1330   CO2 25 04/04/2015 1015   GLUCOSE 110* 05/12/2015 1355   GLUCOSE 122* 05/02/2015 1330   GLUCOSE 147* 04/04/2015 1015   BUN 9 05/12/2015 1355   BUN 10 05/02/2015 1330   BUN 11 04/04/2015 1015   CREATININE 0.63 05/12/2015 1355   CREATININE 0.63 05/02/2015 1330   CREATININE 0.62 04/04/2015 1015   CREATININE 0.70 03/01/2015 1245   CREATININE 0.59 01/18/2015 0847   CREATININE 0.69 10/12/2014 0849   CALCIUM 9.0 05/12/2015 1355   CALCIUM 9.2 05/02/2015 1330   CALCIUM 9.0 04/04/2015 1015   GFRNONAA >60 05/12/2015 1355   GFRNONAA >60 05/02/2015 1330   GFRNONAA >60 04/04/2015 1015   GFRAA >60 05/12/2015 1355   GFRAA >60 05/02/2015 1330   GFRAA >60 04/04/2015 1015   CMP     Component Value Date/Time   NA 132* 05/12/2015 1355   K 4.5 05/12/2015 1355   CL 97* 05/12/2015 1355   CO2 26 05/12/2015 1355   GLUCOSE 110* 05/12/2015 1355   BUN 9 05/12/2015 1355   CREATININE 0.63 05/12/2015 1355   CREATININE 0.70 03/01/2015 1245   CALCIUM 9.0 05/12/2015 1355   PROT 6.6 05/12/2015 1355   ALBUMIN 4.0 05/12/2015 1355   AST 54* 05/12/2015 1355   ALT 62 05/12/2015  1355   ALKPHOS 66 05/12/2015 1355   BILITOT 0.3 05/12/2015 1355   GFRNONAA >60 05/12/2015 1355   GFRAA >60 05/12/2015 1355       Component Value Date/Time   WBC 6.5 05/12/2015 1355   WBC 5.2 05/02/2015 1330   WBC 4.8 04/04/2015 1015   HGB 10.3* 05/12/2015 1355   HGB 9.5* 05/02/2015 1330   HGB 9.7* 04/04/2015 1015   HCT 33.4* 05/12/2015 1355   HCT 31.7*  05/02/2015 1330   HCT 31.9* 04/04/2015 1015   MCV 74.6* 05/12/2015 1355   MCV 72.9* 05/02/2015 1330   MCV 73.8* 04/04/2015 1015    Lipid Panel     Component Value Date/Time   CHOL 132 10/12/2014 0849   TRIG 108 10/12/2014 0849   HDL 42 10/12/2014 0849   CHOLHDL 3.1 10/12/2014 0849   VLDL 22 10/12/2014 0849   LDLCALC 68 10/12/2014 0849    ABG No results found for: PHART, PCO2ART, PO2ART, HCO3, TCO2, ACIDBASEDEF, O2SAT   Lab Results  Component Value Date   TSH 7.591* 05/12/2015   BNP (last 3 results) No results for input(s): BNP in the last 8760 hours.  ProBNP (last 3 results) No results for input(s): PROBNP in the last 8760 hours.  Cardiac Panel (last 3 results) No results for input(s): CKTOTAL, CKMB, TROPONINI, RELINDX in the last 72 hours.  Iron/TIBC/Ferritin/ %Sat    Component Value Date/Time   IRON 20* 05/02/2015 1331   TIBC 434 05/02/2015 1331   FERRITIN 8* 05/02/2015 1331   IRONPCTSAT 5* 05/02/2015 1331   IRONPCTSAT 6* 04/13/2013 0846     EKG Orders placed or performed in visit on 04/04/15  . EKG 12-Lead  . EKG 12-Lead     Prior Assessment and Plan Problem List as of 05/16/2015      Cardiovascular and Mediastinum   Essential hypertension   Last Assessment & Plan 03/01/2015 Office Visit Written 03/02/2015 10:40 AM by [name], MD    BP tolerating Diuretic      Sinus pause     Digestive   GERD   NASH (nonalcoholic steatohepatitis)   Last Assessment & Plan 10/12/2014 Office Visit Written 10/12/2014  1:35 PM by [name], MD    Recheck liver function as well as cholesterol level  from recent hospitalization      Hepatomegaly   Last Assessment & Plan 05/25/2014 Office Visit Written 05/31/2014  5:12 PM by Orvil Feil, NP    On exam. Known fatty liver. Proceed with elastography.       Liver fibrosis   Last Assessment & Plan 06/29/2014 Office Visit Written 06/29/2014  2:31 PM by Orvil Feil, NP    elastography ordered due to hepatomegaly. Metavir score of F4. Obtain viral markers. LFTs normal. Likely secondary to NASH. EGD already planned due to IDA. Needs [place] every 6 months and hepatitis vaccinations. Prescription provided.         Endocrine   Type II diabetes mellitus with neurological manifestations   Last Assessment & Plan 04/24/2015 Office Visit Written 04/24/2015  1:57 PM by [name], MD    A1c in office today 6.7% showing very good control of his diabetes to change her current regimen      Hypothyroidism   Last Assessment & Plan 11/30/2014 Office Visit Written 11/30/2014  9:13 PM by [name], MD    Recheck TFT on synthroid, titrate as needed        Nervous and Auditory   Peripheral neuropathy   Last Assessment & Plan 10/12/2014 Office Visit Written 10/12/2014  1:36 PM by [name], MD    Continue gabapentin he will also make a follow-up with podiatry for nail trimming and foot care      Carpal tunnel syndrome   Last Assessment & Plan 04/13/2013 Office Visit Written 04/13/2013  9:29 AM by [name], MD    Diagnoses given by neurology he'll be referred to a new neurologist  for second opinion and conduction studies        Musculoskeletal and Integument   ARTHRITIS   Patellar tendonitis   Meniscus tear   Last Assessment & Plan 02/08/2014 Office Visit Written 02/08/2014  4:47 PM by [name], MD    Status post arthroscopic  will forward previous notes      Osteoarthritis of left knee   Last Assessment & Plan 02/13/2015 Office Visit Written 02/13/2015  5:18 PM by [name], MD    Change pain meds to perococet 7.14m TID #90  given      DDD (degenerative disc disease), lumbosacral   HNP (herniated nucleus pulposus), lumbar   Dermatitis   Last Assessment & Plan 04/12/2014 Office Visit Written 04/12/2014  5:22 PM by KAlycia Rossetti MD    I will give him clobetasol to apply to the lesions in his scalp after washing possible this is psoriasis related he does not have any significant plaques therefore difficult to tell.      Atypical nevi   Last Assessment & Plan 01/18/2015 Office Visit Written 01/18/2015  8:50 AM by KAlycia Rossetti MD    Mole on face enlarging in size, needs removal Also has lesion on lower eyelid that needs evaluation Referral to dermatology      Stage I pressure sore   Last Assessment & Plan 03/01/2015 Office Visit Written 03/02/2015 10:41 AM by KAlycia Rossetti MD    Improved pressure sore, will continue to dress for next week or 2, then this should resolve as long as he tries to move around and not sit all day      Venous stasis dermatitis   Last Assessment & Plan 04/24/2015 Office Visit Written 04/24/2015  1:56 PM by KAlycia Rossetti MD    Clobetasol topical twice a day        Other   Hyperlipidemia   Last Assessment & Plan 01/18/2015 Office Visit Written 01/18/2015  8:48 AM by KAlycia Rossetti MD    cholesteorl at goal, continue statin drug      OBESITY, MORBID   Last Assessment & Plan 01/05/2013 Office Visit Written 01/05/2013  8:53 AM by KAlycia Rossetti MD    He is being more active with watching his diet, decreased mobility      S/P arthroscopy of left knee   Peripheral edema   Last Assessment & Plan 04/24/2015 Office Visit Written 04/24/2015  1:58 PM by KAlycia Rossetti MD    Chronic peripheral edema with some mild diastolic dysfunction noted on echo performed in 2014. He is tried and failed Lasix currently on higher dose torsemide and he is urinating very well however there is no change in the edema. The edema is prohibiting him from having his knee surgery. I will change him to  Bumex and I'm also going to consult cardiology. We have tried getting compression hose however due to his dimensions they're finding difficulty getting stockings for him. We've also tried compression wraps for the past month but these did not help      Status post arthroscopy of left knee   Spinal stenosis of lumbar region   Last Assessment & Plan 07/14/2013 Office Visit Written 07/14/2013  5:46 PM by KAlycia Rossetti MD    Reviewed MRI and orthopedic note. We'll await neurosurgery appointment. We'll continue him on the gabapentin      Anemia   Last Assessment & Plan 11/19/2013 Office Visit Written 11/21/2013  8:30 AM by  [name], MD    Recheck CBC , he wants to hold on GI at this time, we will send pending labs      Difficulty in walking(719.7)   Knee pain, chronic   Stiffness of joint, not elsewhere classified, pelvic region and thigh   Iron deficiency anemia   Last Assessment & Plan 05/12/2015 Office Visit Edited 05/12/2015 12:57 PM by Baird Cancer, PA-C    Iron deficiency anemia  Patient was referred to [place] from his orthopod, Dr. Aline Brochure, when he was noted to be anemic as part of the pre-op work-up.    Work-up reveals a microcytic, hypochromic anemia with a single-digit ferritin.  As a result, his iron deficit was calculated to be around 900.  Therefore, he was started on IV feraheme, 510 mg, on 05/05/2015 and he is receiving his second dose today, 05/12/2015.  Will repeat labs in 1 week and if Hgb is improved, will let Dr. Aline Brochure know so surgery can be planned again.  Labs in 6 weeks: cbc diff, ferritin  Return in 6 weeks for follow-up.      IDA (iron deficiency anemia)   Last Assessment & Plan 06/29/2014 Office Visit Edited 06/29/2014  2:30 PM by Orvil Feil, NP    69 year old male with IDA, prior work-up in 2011 at [facility] with negative EGD/enteroscopy and colonoscopy. Patient reports capsule study done at this time but no records available. No overt signs of GI  bleeding or concerning GI symptoms. On Mobic AND naprosyn, which could cause insult anywhere in the GI tract. Mother with history of rectal cancer. Prior history of failed sedation.  Proceed with TCS/EGD with Dr. Gala Romney in near future: the risks, benefits, and alternatives have been discussed with the patient in detail. The patient states understanding and desires to proceed. PROPOFOL for sedation Limit naprosyn and Mobic May ultimately need capsule study Increase iron to BID      Bradycardia   Intertriginous candidiasis   Diastolic dysfunction       Imaging: No results found.

## 2015-05-16 NOTE — Progress Notes (Signed)
Cardiology Office Note   Date:  05/16/2015   ID:  [name], DOB 30-Nov-1945, MRN [id number]  PCP:  Vic Blackbird, MD  Cardiologist:  Cloria Spring, NP   Chief Complaint  Patient presents with  . Hypertension  . Edema      History of Present Illness: Michael [name] is a 69 y.o. male who presents for ongoing assessment and management of hypertension, with history of lower extremity edema, COPD, diabetes, and hyperlipidemia. This is a focused visit for chronic diastolic CHF.   The patient echocardiogram dated 05/02/2005 he demonstrated no LVEF of 60-65% with grade 2 diastolic dysfunction.  He was last seen in the office on 05/08/2015, he lost 6 pounds of fluid.  Unfortunately, he continued to have evidence of fluid overload with 2+3+ pretibial edema, even as the patient stated it was much better.  I increased his Bumex to 2 mg in a.m,  1 mg in the afternoon.  He was to continue metolazone weekly.  His lowest weight was 201 pounds, and a last office visit he was 15 pounds heavier than he was at that weight.  A repeat be met was ordered, along with a CBC.  Blood pressure was controlled.  Followup labs dated 05/12/2015, sodium 132, potassium 4.5, chloride 97, CO2 26, glucose 110, creatinine 0.63, BUN 9, AST 54; Mg 1.5, Hgb 10.3/Hct 33.4.   On this office visit, he has not lost any weight despite the increased dose of Bumex. He is eating salty frozen dinners."Hungry Man" but denies adding salt. He has continued LEE edema. He is medically compliant.   Past Medical History  Diagnosis Date  . COPD (chronic obstructive pulmonary disease)   . Type 2 diabetes mellitus   . Hyperlipidemia   . Essential hypertension   . Iron deficiency anemia   . NASH (nonalcoholic steatohepatitis)   . Diabetic neuropathy   . GERD (gastroesophageal reflux disease)   . Arthritis   . Depression   . Diastolic dysfunction   . Left knee DJD     Past Surgical History  Procedure Laterality Date   . Hernia repair    . Shoulder surgery      Rght-rotator cuff  . Lipoma removal      Stomach  . Knee arthroscopy with medial menisectomy Left 11/06/2012    Procedure: KNEE ARTHROSCOPY WITH MEDIAL MENISECTOMY;  Surgeon: Carole Civil, MD;  Location: [facility];  Service: Orthopedics;  Laterality: Left;  . Lumbar laminectomy/decompression microdiscectomy Left 08/30/2013    Procedure: LUMBAR LAMINECTOMY/DECOMPRESSION MICRODISCECTOMY LEFT  LUMBAR TWO THREE;  Surgeon: Otilio Connors, MD;  Location: [facility];  Service: Neurosurgery;  Laterality: Left;  . Egd with enteroscopy  2011    [facility]: normal esophagus and stomach. Normal duodenum, jejunum. No evidence of AVMs.   . Colonoscopy  2011    [facility]: normal colon, normal distal ileum  . Colonoscopy with propofol N/A 07/21/2014    Procedure: ATTEMPTED COLONOSCOPY WITH PROPOFOL-HAD TO STOP DUE TO BRADYCARDIA;  Surgeon: Daneil Dolin, MD;  Location: [facility];  Service: Endoscopy;  Laterality: N/A;  . Esophagogastroduodenoscopy (egd) with propofol N/A 07/21/2014    Procedure: ESOPHAGOGASTRODUODENOSCOPY (EGD) WITH PROPOFOL;  Surgeon: Daneil Dolin, MD;  Location: [facility];  Service: Endoscopy;  Laterality: N/A;  . Esophageal biopsy N/A 07/21/2014    Procedure: BIOPSY;  Surgeon: Daneil Dolin, MD;  Location: [facility];  Service: Endoscopy;  Laterality: N/A;     Current Outpatient Prescriptions  Medication  Sig Dispense Refill  . ACCU-CHEK AVIVA PLUS test strip     . aspirin EC 81 MG tablet Take 81 mg by mouth daily.    . B-D INS SYRINGE 0.5CC/31GX5/16 31G X 5/16" 0.5 ML MISC     . benazepril (LOTENSIN) 40 MG tablet TAKE ONE TABLET BY MOUTH DAILY. 90 tablet 1  . bumetanide (BUMEX) 1 MG tablet Take 2 mg in the AM / continue to take 1 mg in the PM 75 tablet 3  . clobetasol cream (TEMOVATE) 6.97 % Apply 1 application topically 2 (two) times daily. 45 g 1  . diclofenac sodium (VOLTAREN) 1 % GEL Apply 4 g topically 4 (four) times daily. 5 Tube 5  .  Ferrous Sulfate (IRON) 325 (65 FE) MG TABS Take 1 tablet by mouth daily.     Marland Kitchen gabapentin (NEURONTIN) 400 MG capsule TAKE ONE CAPSULE BY MOUTH THREE TIMES DAILY. 90 capsule 2  . glipiZIDE (GLUCOTROL) 10 MG tablet TAKE ONE TABLET BY MOUTH TWICE DAILY BEFORE MEALS. 180 tablet 1  . Insulin Glargine (LANTUS) 100 UNIT/ML Solostar Pen Inject 30 Units into the skin at bedtime. 15 mL 11  . levothyroxine (SYNTHROID, LEVOTHROID) 50 MCG tablet Take 1 tablet (50 mcg total) by mouth daily before breakfast. 30 tablet 1  . meloxicam (MOBIC) 15 MG tablet Take 15 mg by mouth daily.     . metFORMIN (GLUCOPHAGE) 1000 MG tablet TAKE ONE TABLET BY MOUTH TWICE DAILY WITH A MEAL. 60 tablet 6  . metolazone (ZAROXOLYN) 5 MG tablet TAKE ONE TABLET BY MOUTH WEEKLY AS NEEDED FOR SWELLING 5 tablet 6  . naproxen (NAPROSYN) 375 MG tablet     . nebivolol (BYSTOLIC) 10 MG tablet Take 1 tablet (10 mg total) by mouth daily. 30 tablet 3  . nystatin cream (MYCOSTATIN) Apply 1 application topically 2 (two) times daily. 45 g 1  . omeprazole (PRILOSEC) 20 MG capsule TAKE ONE CAPSULE BY MOUTH TWICE DAILY. 60 capsule 11  . oxyCODONE-acetaminophen (PERCOCET) 7.5-325 MG per tablet Take 1 tablet by mouth every 6 (six) hours as needed for severe pain. 90 tablet 0  . potassium chloride (K-DUR) 10 MEQ tablet Take 2 tablets in the AM / Take 1 tablet in the PM 90 tablet 3  . pravastatin (PRAVACHOL) 40 MG tablet Take 1 tablet (40 mg total) by mouth every evening. 90 tablet 1   No current facility-administered medications for this visit.    Allergies:   Review of patient's allergies indicates no known allergies.    Social History:  The patient  reports that he quit smoking about 15 years ago. His smoking use included Cigarettes. He has a 110 pack-year smoking history. He quit smokeless tobacco use about 15 years ago. He reports that he does not drink alcohol or use illicit drugs.   Family History:  The patient's family history includes  Depression in his mother; Diabetes in his brother, brother, father, mother, sister, and sister; Heart disease in his father, mother, sister, sister, and sister; Hyperlipidemia in his father, mother, sister, sister, and sister; Hypertension in his father, mother, sister, sister, and sister; Rectal cancer in his mother.    ROS: .   All other systems are reviewed and negative.Unless otherwise mentioned in  H&P above.   PHYSICAL EXAM: VS:  BP 124/58 mmHg  Pulse 77  Ht _0  (1.499 m)  Wt 216 lb (97.977 kg)  BMI 43.60 kg/m2  SpO2 98% , BMI Body mass index is 43.6 kg/(m^2). GEN: Well  nourished, well developed, in no acute distress HEENT: normal Neck: no JVD, carotid bruits, or masses Cardiac: IRRR; no murmurs, rubs, or gallops,no edema  Respiratory:  clear to auscultation bilaterally, normal work of breathing GI: soft, nontender, mildly distended, + BS MS: no deformity or atrophy2+=3+ pitting edema bilaterally to the feet.  Skin: warm and dry, no rash Neuro:  Strength and sensation are intact Psych: euthymic mood, full affect   Recent Labs: 05/12/2015: ALT 62; BUN 9; Creatinine, Ser 0.63; Hemoglobin 10.3*; Magnesium 1.5*; Platelets 230; Potassium 4.5; Sodium 132*; TSH 7.591*    Lipid Panel    Component Value Date/Time   CHOL 132 10/12/2014 0849   TRIG 108 10/12/2014 0849   HDL 42 10/12/2014 0849   CHOLHDL 3.1 10/12/2014 0849   VLDL 22 10/12/2014 0849   LDLCALC 68 10/12/2014 0849      Wt Readings from Last 3 Encounters:  05/16/15 216 lb (97.977 kg)  05/08/15 216 lb 6.4 oz (98.158 kg)  05/02/15 222 lb (100.699 kg)      Other studies Reviewed: Additional studies/ records that were reviewed today include: N/A Review of the above records demonstrates: N/A   ASSESSMENT AND PLAN:  1.Chronic Diastolic Dysfunction: I have had a long discussion with him concerning the salt he is still ingesting, causing him to continue to retain fluid. I am considering having him admitted for  IV diureses. He is not in favor of admission and wants to try harder at home concerning his salt content.  I have asked Dr. Harl Bowie, his primary cardiologist to help me make a decision about admission vs continued medical management.   Dr. Harl Bowie has seen and examined the patient with me. He recommends increasing metolazone dose to 3 times a week. He has already taken metolazone 5 mg on Monday, he will take it again on Wed, and again on Friday. He will increase his potassium to 20 mEq BID on the days he takes the metolazone. He will also be given magnesium oxide, 400 mg BID as his magnesium was 1.5. He will have BMET drawn in one week and be seen again in one week. If he has not lost any weight and remains edematous. He will be admitted. Patient verbalizes understanding.    Current medicines are reviewed at length with the patient today.    Labs/ tests ordered today include: BMET No orders of the defined types were placed in this encounter.     Disposition:   FU with in one week.  Signed, Jory Sims, NP  05/16/2015 1:22 PM    [facility] [address] [place], [place] [address] Phone: [phone]; Fax: [phone]

## 2015-05-16 NOTE — Patient Instructions (Addendum)
Your physician recommends that you schedule a follow-up appointment in: 1 week with Jory Sims, NP.  Your physician has recommended you make the following change in your medication:   Please take Metolazone on Wednesday and Friday. On the days that you take your Metolazone take Potassium 20 meq in the morning and 20 meq in the evening.  Start Magnesium 400 mg Two times daily.   Your physician recommends that you return for lab work on Monday (BET, Mag)  Thank you for choosing [facility]!

## 2015-05-18 ENCOUNTER — Encounter (HOSPITAL_COMMUNITY): Payer: Medicare Other

## 2015-05-18 DIAGNOSIS — D509 Iron deficiency anemia, unspecified: Secondary | ICD-10-CM

## 2015-05-18 DIAGNOSIS — D649 Anemia, unspecified: Secondary | ICD-10-CM | POA: Diagnosis not present

## 2015-05-18 LAB — CBC
HEMATOCRIT: 36 % — AB (ref 39.0–52.0)
HEMOGLOBIN: 11.4 g/dL — AB (ref 13.0–17.0)
MCH: 23.8 pg — ABNORMAL LOW (ref 26.0–34.0)
MCHC: 31.7 g/dL (ref 30.0–36.0)
MCV: 75.2 fL — AB (ref 78.0–100.0)
Platelets: 240 10*3/uL (ref 150–400)
RBC: 4.79 MIL/uL (ref 4.22–5.81)
RDW: 20.2 % — AB (ref 11.5–15.5)
WBC: 6.6 10*3/uL (ref 4.0–10.5)

## 2015-05-19 ENCOUNTER — Other Ambulatory Visit (HOSPITAL_COMMUNITY): Payer: Medicare Other

## 2015-05-22 ENCOUNTER — Ambulatory Visit (INDEPENDENT_AMBULATORY_CARE_PROVIDER_SITE_OTHER): Payer: Medicare Other | Admitting: Family Medicine

## 2015-05-22 ENCOUNTER — Encounter: Payer: Self-pay | Admitting: Family Medicine

## 2015-05-22 VITALS — BP 136/74 | HR 78 | Temp 98.2°F | Resp 16 | Ht 59.0 in | Wt 210.0 lb

## 2015-05-22 DIAGNOSIS — E038 Other specified hypothyroidism: Secondary | ICD-10-CM | POA: Diagnosis not present

## 2015-05-22 DIAGNOSIS — I5032 Chronic diastolic (congestive) heart failure: Secondary | ICD-10-CM | POA: Diagnosis not present

## 2015-05-22 DIAGNOSIS — D509 Iron deficiency anemia, unspecified: Secondary | ICD-10-CM | POA: Diagnosis not present

## 2015-05-22 DIAGNOSIS — I10 Essential (primary) hypertension: Secondary | ICD-10-CM

## 2015-05-22 DIAGNOSIS — R609 Edema, unspecified: Secondary | ICD-10-CM | POA: Diagnosis not present

## 2015-05-22 MED ORDER — OXYCODONE-ACETAMINOPHEN 7.5-325 MG PO TABS: 1.0000 | ORAL_TABLET | Freq: Four times a day (QID) | ORAL | Status: DC | PRN

## 2015-05-22 NOTE — Progress Notes (Signed)
Patient ID: Michael [name], male   DOB: 08-04-1946, 69 y.o.   MRN: [id number]   Subjective:    Patient ID: Michael [name], male    DOB: 1946/02/08, 69 y.o.   MRN: [id number]  Patient presents for 4 month F/U  patient here for follow-up on chronic medical problems. Chronic iron deficiency anemia secondary to questionable slow GI bleed which we never found the source of. He has been evaluated by GI multiple times. When he tried to have his preop done he did not have his knee surgery because his hemoglobin was too low he was then referred to hematology he's had 2 iron transfusions and his hemoglobin is now up to 11.4. He has been cleared to have surgery from hematology standpoint. Her second issue has been his peripheral edema which has been very difficult to control he was seen by cardiology echocardiogram was repeated and showed diastolic dysfunction he was titrated on his Bumex and Metalazone was added he was not losing any significant weight until this past week his weight is down 6 pounds he states that he cut out the salt intake and has been taking his evening dose of medication.     Review Of Systems:  GEN- denies fatigue, fever, weight loss,weakness, recent illness HEENT- denies eye drainage, change in vision, nasal discharge, CVS- denies chest pain, palpitations RESP- denies SOB, cough, wheeze ABD- denies N/V, change in stools, abd pain GU- denies dysuria, hematuria, dribbling, incontinence MSK- + joint pain, muscle aches, injury Neuro- denies headache, dizziness, syncope, seizure activity       Objective:    BP 136/74 mmHg  Pulse 78  Temp(Src) 98.2 F (36.8 C) (Oral)  Resp 16  Ht 4' 11"  (1.499 m)  Wt 210 lb (95.255 kg)  BMI 42.39 kg/m2 GEN- NAD, alert and oriented x3,sitting in chair, walks with walker  CVS- RRR, no murmur RESP-CTAB ABD-NABS,soft,NT,ND EXT- improved 1+ pitting edema to knees  Pulses- Radial, DP- 1+         Assessment & Plan:      Problem List  Items Addressed This Visit    None      Note: This dictation was prepared with Dragon dictation along with smaller phrase technology. Any transcriptional errors that result from this process are unintentional.

## 2015-05-22 NOTE — Assessment & Plan Note (Signed)
His fluid retention is much improved. F/U with cardiology tomorrow, hopefully he can be cleared for his knee replacement His BMET and mag level will be rechecked today

## 2015-05-22 NOTE — Assessment & Plan Note (Signed)
Hb improved, above baseline, cleared for surgery

## 2015-05-22 NOTE — Patient Instructions (Addendum)
Continue current medications We will call with lab results We will let you know when you are cleared for surgery  F/U 2 months

## 2015-05-22 NOTE — Assessment & Plan Note (Signed)
TFT abnormal past 2 weeks, however with anemia and signficant edema causing stress on body, I will wait about 8 weeks and recheck these levels, no change to medication dose

## 2015-05-22 NOTE — Assessment & Plan Note (Signed)
Blood pressure improved, with diuresis

## 2015-05-23 ENCOUNTER — Ambulatory Visit (INDEPENDENT_AMBULATORY_CARE_PROVIDER_SITE_OTHER): Payer: Medicare Other | Admitting: Adult Health

## 2015-05-23 ENCOUNTER — Encounter: Payer: Self-pay | Admitting: Adult Health

## 2015-05-23 VITALS — BP 128/74 | HR 74 | Ht 59.0 in | Wt 205.0 lb

## 2015-05-23 DIAGNOSIS — I10 Essential (primary) hypertension: Secondary | ICD-10-CM | POA: Diagnosis not present

## 2015-05-23 DIAGNOSIS — I5032 Chronic diastolic (congestive) heart failure: Secondary | ICD-10-CM | POA: Diagnosis not present

## 2015-05-23 LAB — BASIC METABOLIC PANEL
BUN: 10 mg/dL (ref 7–25)
CALCIUM: 9.3 mg/dL (ref 8.6–10.3)
CO2: 22 mmol/L (ref 20–31)
CREATININE: 0.6 mg/dL — AB (ref 0.70–1.25)
Chloride: 87 mmol/L — ABNORMAL LOW (ref 98–110)
Glucose, Bld: 97 mg/dL (ref 65–99)
Potassium: 4.3 mmol/L (ref 3.5–5.3)
Sodium: 124 mmol/L — ABNORMAL LOW (ref 135–146)

## 2015-05-23 LAB — MAGNESIUM: Magnesium: 1.4 mg/dL — ABNORMAL LOW (ref 1.5–2.5)

## 2015-05-23 MED ORDER — MAGNESIUM 400 MG PO TABS: 400.0000 mg | ORAL_TABLET | Freq: Every day | ORAL | Status: DC

## 2015-05-23 NOTE — Patient Instructions (Addendum)
Your physician recommends that you schedule a follow-up appointment in: 2 weeks with K.Lawrence, NP  Your physician has recommended you make the following change in your medication:   STOP Metolazone   Start Magnesium 400 mg Take 2 Tablets today and Starting Tomorrow Take 1 Tablet   Your physician recommends that you return for lab work in: BMET, Mg   Thank you for choosing [facility]!

## 2015-05-23 NOTE — Progress Notes (Signed)
Name: Michael [name]    DOB: 1946/07/12  Age: 69 y.o.  MR#: [id number]       PCP:  Vic Blackbird, MD      Insurance: Payor: [employer] MEDICARE / Plan: [facility] MEDICARE / Product Type: *No Product type* /   CC:   No chief complaint on file.   VS Filed Vitals:   05/23/15 1302  BP: 128/74  Pulse: 74  Height: 4' 11"  (1.499 m)  Weight: 205 lb (92.987 kg)  SpO2: 97%    Weights Current Weight  05/23/15 205 lb (92.987 kg)  05/22/15 210 lb (95.255 kg)  05/16/15 216 lb (97.977 kg)    Blood Pressure  BP Readings from Last 3 Encounters:  05/23/15 128/74  05/22/15 136/74  05/16/15 124/58     Admit date:  (Not on file) Last encounter with RMR:  05/16/2015   Allergy Review of patient's allergies indicates no known allergies.  Current Outpatient Prescriptions  Medication Sig Dispense Refill  . ACCU-CHEK AVIVA PLUS test strip     . aspirin EC 81 MG tablet Take 81 mg by mouth daily.    . B-D INS SYRINGE 0.5CC/31GX5/16 31G X 5/16" 0.5 ML MISC     . benazepril (LOTENSIN) 40 MG tablet TAKE ONE TABLET BY MOUTH DAILY. 90 tablet 1  . bumetanide (BUMEX) 1 MG tablet Take 2 mg in the AM / continue to take 1 mg in the PM 75 tablet 3  . clobetasol cream (TEMOVATE) 6.62 % Apply 1 application topically 2 (two) times daily. 45 g 1  . diclofenac sodium (VOLTAREN) 1 % GEL Apply 4 g topically 4 (four) times daily. 5 Tube 5  . Ferrous Sulfate (IRON) 325 (65 FE) MG TABS Take 1 tablet by mouth daily.     Marland Kitchen gabapentin (NEURONTIN) 400 MG capsule TAKE ONE CAPSULE BY MOUTH THREE TIMES DAILY. 90 capsule 2  . glipiZIDE (GLUCOTROL) 10 MG tablet TAKE ONE TABLET BY MOUTH TWICE DAILY BEFORE MEALS. 180 tablet 1  . Insulin Glargine (LANTUS) 100 UNIT/ML Solostar Pen Inject 30 Units into the skin at bedtime. 15 mL 11  . levothyroxine (SYNTHROID, LEVOTHROID) 50 MCG tablet Take 1 tablet (50 mcg total) by mouth daily before breakfast. 30 tablet 1  . Magnesium 400 MG TABS Take 400 mg by mouth daily. 30 tablet 6  .  meloxicam (MOBIC) 15 MG tablet Take 15 mg by mouth daily.     . metFORMIN (GLUCOPHAGE) 1000 MG tablet TAKE ONE TABLET BY MOUTH TWICE DAILY WITH A MEAL. 60 tablet 6  . metolazone (ZAROXOLYN) 5 MG tablet TAKE ONE TABLET BY MOUTH WEEKLY AS NEEDED FOR SWELLING 5 tablet 6  . naproxen (NAPROSYN) 375 MG tablet     . nebivolol (BYSTOLIC) 10 MG tablet Take 1 tablet (10 mg total) by mouth daily. 30 tablet 3  . nystatin cream (MYCOSTATIN) Apply 1 application topically 2 (two) times daily. 45 g 1  . omeprazole (PRILOSEC) 20 MG capsule TAKE ONE CAPSULE BY MOUTH TWICE DAILY. 60 capsule 11  . oxyCODONE-acetaminophen (PERCOCET) 7.5-325 MG per tablet Take 1 tablet by mouth every 6 (six) hours as needed for severe pain. 90 tablet 0  . potassium chloride (K-DUR) 10 MEQ tablet Take 2 tablets in the AM / Take 1 tablet in the PM 90 tablet 3  . pravastatin (PRAVACHOL) 40 MG tablet Take 1 tablet (40 mg total) by mouth every evening. 90 tablet 1   No current facility-administered medications for this visit.  Discontinued Meds:   There are no discontinued medications.  Patient Active Problem List   Diagnosis Date Noted  . Diastolic CHF 03/50/0938  . Diastolic dysfunction 18/29/9371  . Venous stasis dermatitis 04/20/2015  . Stage I pressure sore 02/13/2015  . Atypical nevi 01/18/2015  . Intertriginous candidiasis 12/23/2014  . Hypothyroidism 11/30/2014  . Bradycardia 07/21/2014  . Sinus pause 07/21/2014  . Liver fibrosis 06/29/2014  . IDA (iron deficiency anemia) 06/29/2014  . Iron deficiency anemia 05/31/2014  . Hepatomegaly 05/25/2014  . Difficulty in walking(719.7) 05/03/2014  . Knee pain, chronic 05/03/2014  . Stiffness of joint, not elsewhere classified, pelvic region and thigh 05/03/2014  . Dermatitis 04/12/2014  . HNP (herniated nucleus pulposus), lumbar 08/30/2013  . Spinal stenosis of lumbar region 07/06/2013  . Carpal tunnel syndrome 04/13/2013  . Peripheral edema 04/13/2013  . Status post  arthroscopy of left knee 04/13/2013  . DDD (degenerative disc disease), lumbosacral 04/13/2013  . Peripheral neuropathy 01/05/2013  . S/P arthroscopy of left knee 11/09/2012  . Osteoarthritis of left knee 10/27/2012  . Meniscus tear 10/07/2012  . Patellar tendonitis 07/01/2012  . NASH (nonalcoholic steatohepatitis) 05/22/2007  . Type II diabetes mellitus with neurological manifestations 05/20/2007  . Hyperlipidemia 10/15/2006  . OBESITY, MORBID 10/15/2006  . Essential hypertension 10/15/2006  . GERD 10/15/2006    LABS    Component Value Date/Time   NA 124* 05/22/2015 0935   NA 132* 05/12/2015 1355   NA 134* 05/02/2015 1330   K 4.3 05/22/2015 0935   K 4.5 05/12/2015 1355   K 4.4 05/02/2015 1330   CL 87* 05/22/2015 0935   CL 97* 05/12/2015 1355   CL 95* 05/02/2015 1330   CO2 22 05/22/2015 0935   CO2 26 05/12/2015 1355   CO2 27 05/02/2015 1330   GLUCOSE 97 05/22/2015 0935   GLUCOSE 110* 05/12/2015 1355   GLUCOSE 122* 05/02/2015 1330   BUN 10 05/22/2015 0935   BUN 9 05/12/2015 1355   BUN 10 05/02/2015 1330   CREATININE 0.60* 05/22/2015 0935   CREATININE 0.63 05/12/2015 1355   CREATININE 0.63 05/02/2015 1330   CREATININE 0.62 04/04/2015 1015   CREATININE 0.70 03/01/2015 1245   CREATININE 0.59 01/18/2015 0847   CALCIUM 9.3 05/22/2015 0935   CALCIUM 9.0 05/12/2015 1355   CALCIUM 9.2 05/02/2015 1330   GFRNONAA >60 05/12/2015 1355   GFRNONAA >60 05/02/2015 1330   GFRNONAA >60 04/04/2015 1015   GFRAA >60 05/12/2015 1355   GFRAA >60 05/02/2015 1330   GFRAA >60 04/04/2015 1015   CMP     Component Value Date/Time   NA 124* 05/22/2015 0935   K 4.3 05/22/2015 0935   CL 87* 05/22/2015 0935   CO2 22 05/22/2015 0935   GLUCOSE 97 05/22/2015 0935   BUN 10 05/22/2015 0935   CREATININE 0.60* 05/22/2015 0935   CREATININE 0.63 05/12/2015 1355   CALCIUM 9.3 05/22/2015 0935   PROT 6.6 05/12/2015 1355   ALBUMIN 4.0 05/12/2015 1355   AST 54* 05/12/2015 1355   ALT 62 05/12/2015  1355   ALKPHOS 66 05/12/2015 1355   BILITOT 0.3 05/12/2015 1355   GFRNONAA >60 05/12/2015 1355   GFRAA >60 05/12/2015 1355       Component Value Date/Time   WBC 6.6 05/18/2015 1218   WBC 6.5 05/12/2015 1355   WBC 5.2 05/02/2015 1330   HGB 11.4* 05/18/2015 1218   HGB 10.3* 05/12/2015 1355   HGB 9.5* 05/02/2015 1330   HCT 36.0* 05/18/2015 1218   HCT  33.4* 05/12/2015 1355   HCT 31.7* 05/02/2015 1330   MCV 75.2* 05/18/2015 1218   MCV 74.6* 05/12/2015 1355   MCV 72.9* 05/02/2015 1330    Lipid Panel     Component Value Date/Time   CHOL 132 10/12/2014 0849   TRIG 108 10/12/2014 0849   HDL 42 10/12/2014 0849   CHOLHDL 3.1 10/12/2014 0849   VLDL 22 10/12/2014 0849   LDLCALC 68 10/12/2014 0849    ABG No results found for: PHART, PCO2ART, PO2ART, HCO3, TCO2, ACIDBASEDEF, O2SAT   Lab Results  Component Value Date   TSH 7.591* 05/12/2015   BNP (last 3 results) No results for input(s): BNP in the last 8760 hours.  ProBNP (last 3 results) No results for input(s): PROBNP in the last 8760 hours.  Cardiac Panel (last 3 results) No results for input(s): CKTOTAL, CKMB, TROPONINI, RELINDX in the last 72 hours.  Iron/TIBC/Ferritin/ %Sat    Component Value Date/Time   IRON 20* 05/02/2015 1331   TIBC 434 05/02/2015 1331   FERRITIN 8* 05/02/2015 1331   IRONPCTSAT 5* 05/02/2015 1331   IRONPCTSAT 6* 04/13/2013 0846     EKG Orders placed or performed in visit on 04/04/15  . EKG 12-Lead  . EKG 12-Lead     Prior Assessment and Plan Problem List as of 05/23/2015      Cardiovascular and Mediastinum   Essential hypertension   Last Assessment & Plan 05/22/2015 Office Visit Written 05/22/2015  9:37 AM by [name], MD    Blood pressure improved, with diuresis      Sinus pause   Diastolic CHF     Digestive   GERD   NASH (nonalcoholic steatohepatitis)   Last Assessment & Plan 10/12/2014 Office Visit Written 10/12/2014  1:35 PM by [name], MD    Recheck liver  function as well as cholesterol level from recent hospitalization      Hepatomegaly   Last Assessment & Plan 05/25/2014 Office Visit Written 05/31/2014  5:12 PM by Orvil Feil, NP    On exam. Known fatty liver. Proceed with elastography.       Liver fibrosis   Last Assessment & Plan 06/29/2014 Office Visit Written 06/29/2014  2:31 PM by Orvil Feil, NP    elastography ordered due to hepatomegaly. Metavir score of F4. Obtain viral markers. LFTs normal. Likely secondary to NASH. EGD already planned due to IDA. Needs [place] every 6 months and hepatitis vaccinations. Prescription provided.         Endocrine   Type II diabetes mellitus with neurological manifestations   Last Assessment & Plan 04/24/2015 Office Visit Written 04/24/2015  1:57 PM by [name], MD    A1c in office today 6.7% showing very good control of his diabetes to change her current regimen      Hypothyroidism   Last Assessment & Plan 05/22/2015 Office Visit Written 05/22/2015  9:37 AM by [name], MD    TFT abnormal past 2 weeks, however with anemia and signficant edema causing stress on body, I will wait about 8 weeks and recheck these levels, no change to medication dose        Nervous and Auditory   Peripheral neuropathy   Last Assessment & Plan 10/12/2014 Office Visit Written 10/12/2014  1:36 PM by [name], MD    Continue gabapentin he will also make a follow-up with podiatry for nail trimming and foot care      Carpal tunnel syndrome  Last Assessment & Plan 04/13/2013 Office Visit Written 04/13/2013  9:29 AM by [name], MD    Diagnoses given by neurology he'll be referred to a new neurologist for second opinion and conduction studies        Musculoskeletal and Integument   Patellar tendonitis   Meniscus tear   Last Assessment & Plan 02/08/2014 Office Visit Written 02/08/2014  4:47 PM by [name], MD    Status post arthroscopic  will forward previous notes      Osteoarthritis of  left knee   Last Assessment & Plan 02/13/2015 Office Visit Written 02/13/2015  5:18 PM by [name], MD    Change pain meds to perococet 7.15m TID #90 given      DDD (degenerative disc disease), lumbosacral   HNP (herniated nucleus pulposus), lumbar   Dermatitis   Last Assessment & Plan 04/12/2014 Office Visit Written 04/12/2014  5:22 PM by KAlycia Rossetti MD    I will give him clobetasol to apply to the lesions in his scalp after washing possible this is psoriasis related he does not have any significant plaques therefore difficult to tell.      Atypical nevi   Last Assessment & Plan 01/18/2015 Office Visit Written 01/18/2015  8:50 AM by KAlycia Rossetti MD    Mole on face enlarging in size, needs removal Also has lesion on lower eyelid that needs evaluation Referral to dermatology      Stage I pressure sore   Last Assessment & Plan 03/01/2015 Office Visit Written 03/02/2015 10:41 AM by KAlycia Rossetti MD    Improved pressure sore, will continue to dress for next week or 2, then this should resolve as long as he tries to move around and not sit all day      Venous stasis dermatitis   Last Assessment & Plan 04/24/2015 Office Visit Written 04/24/2015  1:56 PM by KAlycia Rossetti MD    Clobetasol topical twice a day        Other   Hyperlipidemia   Last Assessment & Plan 01/18/2015 Office Visit Written 01/18/2015  8:48 AM by KAlycia Rossetti MD    cholesteorl at goal, continue statin drug      OBESITY, MORBID   Last Assessment & Plan 01/05/2013 Office Visit Written 01/05/2013  8:53 AM by KAlycia Rossetti MD    He is being more active with watching his diet, decreased mobility      S/P arthroscopy of left knee   Peripheral edema   Last Assessment & Plan 05/22/2015 Office Visit Written 05/22/2015  9:36 AM by KAlycia Rossetti MD    His fluid retention is much improved. F/U with cardiology tomorrow, hopefully he can be cleared for his knee replacement His BMET and mag level will be  rechecked today       Status post arthroscopy of left knee   Spinal stenosis of lumbar region   Last Assessment & Plan 07/14/2013 Office Visit Written 07/14/2013  5:46 PM by KAlycia Rossetti MD    Reviewed MRI and orthopedic note. We'll await neurosurgery appointment. We'll continue him on the gabapentin      Difficulty in walking(719.7)   Knee pain, chronic   Stiffness of joint, not elsewhere classified, pelvic region and thigh   Iron deficiency anemia   Last Assessment & Plan 05/22/2015 Office Visit Written 05/22/2015  9:36 AM by KAlycia Rossetti MD    Hb improved, above baseline, cleared for  surgery       IDA (iron deficiency anemia)   Last Assessment & Plan 06/29/2014 Office Visit Edited 06/29/2014  2:30 PM by Orvil Feil, NP    69 year old male with IDA, prior work-up in 2011 at [facility] with negative EGD/enteroscopy and colonoscopy. Patient reports capsule study done at this time but no records available. No overt signs of GI bleeding or concerning GI symptoms. On Mobic AND naprosyn, which could cause insult anywhere in the GI tract. Mother with history of rectal cancer. Prior history of failed sedation.  Proceed with TCS/EGD with Dr. Gala Romney in near future: the risks, benefits, and alternatives have been discussed with the patient in detail. The patient states understanding and desires to proceed. PROPOFOL for sedation Limit naprosyn and Mobic May ultimately need capsule study Increase iron to BID      Bradycardia   Intertriginous candidiasis   Diastolic dysfunction       Imaging: No results found.

## 2015-05-23 NOTE — Progress Notes (Signed)
Cardiology Office Note   Date:  05/23/2015   ID:  Biran, Mayberry 11-07-1945, MRN [id number]  PCP:  Vic Blackbird, MD  Cardiologist:  Cloria Spring, NP   No chief complaint on file.     History of Present Illness: Michael [name] is a 69 y.o. male who presents for ongoing assessment and management of hypertension, with history of lower extremity edema, COPD, diabetes, and hyperlipidemia. This is a focused visit for chronic diastolic CHF.  The patient echocardiogram dated 05/02/2005 he demonstrated no LVEF of 60-65% with grade 2 diastolic dysfunction. He was last seen in the office on 08/016/2016,. Unfortunately, he continued to have evidence of fluid overload with 2+3+ pretibial edema, even as the patient stated it was much better. I increased his Bumex to 2 mg in a.m, 1 mg in the afternoon.He was started on metolazone 3 times a week. Marland Kitchen His lowest weight was 201 pounds, and a last office visit he was 15 pounds heavier than he was at that weight. BMET was ordered on follow up.  Na-124, Potassium 4.3, Ch 87, Creatinine 0.60. Magnesium 1.4! He was to start po magnesium replacement and decrease metolazone twice a week based upon the labs. He is here today to evaluate status and need to hospitalize if he is not improved. He has seen Dr. Buelah Manis in the interim. He had lost 6 lbs. Today his wt is down an additional 5 lbs for a total of 11 lbs. He is feeling much better.   Past Medical History  Diagnosis Date  . COPD (chronic obstructive pulmonary disease)   . Type 2 diabetes mellitus   . Hyperlipidemia   . Essential hypertension   . Iron deficiency anemia   . NASH (nonalcoholic steatohepatitis)   . Diabetic neuropathy   . GERD (gastroesophageal reflux disease)   . Arthritis   . Depression   . Diastolic dysfunction   . Left knee DJD     Past Surgical History  Procedure Laterality Date  . Hernia repair    . Shoulder surgery      Rght-rotator cuff  . Lipoma  removal      Stomach  . Knee arthroscopy with medial menisectomy Left 11/06/2012    Procedure: KNEE ARTHROSCOPY WITH MEDIAL MENISECTOMY;  Surgeon: Carole Civil, MD;  Location: [facility];  Service: Orthopedics;  Laterality: Left;  . Lumbar laminectomy/decompression microdiscectomy Left 08/30/2013    Procedure: LUMBAR LAMINECTOMY/DECOMPRESSION MICRODISCECTOMY LEFT  LUMBAR TWO THREE;  Surgeon: Otilio Connors, MD;  Location: [place] [facility];  Service: Neurosurgery;  Laterality: Left;  . Egd with enteroscopy  2011    [facility]: normal esophagus and stomach. Normal duodenum, jejunum. No evidence of AVMs.   . Colonoscopy  2011    [facility]: normal colon, normal distal ileum  . Colonoscopy with propofol N/A 07/21/2014    Procedure: ATTEMPTED COLONOSCOPY WITH PROPOFOL-HAD TO STOP DUE TO BRADYCARDIA;  Surgeon: Daneil Dolin, MD;  Location: [facility];  Service: Endoscopy;  Laterality: N/A;  . Esophagogastroduodenoscopy (egd) with propofol N/A 07/21/2014    Procedure: ESOPHAGOGASTRODUODENOSCOPY (EGD) WITH PROPOFOL;  Surgeon: Daneil Dolin, MD;  Location: [facility];  Service: Endoscopy;  Laterality: N/A;  . Esophageal biopsy N/A 07/21/2014    Procedure: BIOPSY;  Surgeon: Daneil Dolin, MD;  Location: [facility];  Service: Endoscopy;  Laterality: N/A;     Current Outpatient Prescriptions  Medication Sig Dispense Refill  . ACCU-CHEK AVIVA PLUS test strip     . aspirin  EC 81 MG tablet Take 81 mg by mouth daily.    . B-D INS SYRINGE 0.5CC/31GX5/16 31G X 5/16" 0.5 ML MISC     . benazepril (LOTENSIN) 40 MG tablet TAKE ONE TABLET BY MOUTH DAILY. 90 tablet 1  . bumetanide (BUMEX) 1 MG tablet Take 2 mg in the AM / continue to take 1 mg in the PM 75 tablet 3  . clobetasol cream (TEMOVATE) 1.94 % Apply 1 application topically 2 (two) times daily. 45 g 1  . diclofenac sodium (VOLTAREN) 1 % GEL Apply 4 g topically 4 (four) times daily. 5 Tube 5  . Ferrous Sulfate (IRON) 325 (65 FE) MG TABS Take 1 tablet by mouth daily.      Marland Kitchen gabapentin (NEURONTIN) 400 MG capsule TAKE ONE CAPSULE BY MOUTH THREE TIMES DAILY. 90 capsule 2  . glipiZIDE (GLUCOTROL) 10 MG tablet TAKE ONE TABLET BY MOUTH TWICE DAILY BEFORE MEALS. 180 tablet 1  . Insulin Glargine (LANTUS) 100 UNIT/ML Solostar Pen Inject 30 Units into the skin at bedtime. 15 mL 11  . levothyroxine (SYNTHROID, LEVOTHROID) 50 MCG tablet Take 1 tablet (50 mcg total) by mouth daily before breakfast. 30 tablet 1  . Magnesium 400 MG TABS Take 400 mg by mouth daily. 30 tablet 6  . meloxicam (MOBIC) 15 MG tablet Take 15 mg by mouth daily.     . metFORMIN (GLUCOPHAGE) 1000 MG tablet TAKE ONE TABLET BY MOUTH TWICE DAILY WITH A MEAL. 60 tablet 6  . metolazone (ZAROXOLYN) 5 MG tablet TAKE ONE TABLET BY MOUTH WEEKLY AS NEEDED FOR SWELLING 5 tablet 6  . naproxen (NAPROSYN) 375 MG tablet     . nebivolol (BYSTOLIC) 10 MG tablet Take 1 tablet (10 mg total) by mouth daily. 30 tablet 3  . nystatin cream (MYCOSTATIN) Apply 1 application topically 2 (two) times daily. 45 g 1  . omeprazole (PRILOSEC) 20 MG capsule TAKE ONE CAPSULE BY MOUTH TWICE DAILY. 60 capsule 11  . oxyCODONE-acetaminophen (PERCOCET) 7.5-325 MG per tablet Take 1 tablet by mouth every 6 (six) hours as needed for severe pain. 90 tablet 0  . potassium chloride (K-DUR) 10 MEQ tablet Take 2 tablets in the AM / Take 1 tablet in the PM 90 tablet 3  . pravastatin (PRAVACHOL) 40 MG tablet Take 1 tablet (40 mg total) by mouth every evening. 90 tablet 1   No current facility-administered medications for this visit.    Allergies:   Review of patient's allergies indicates no known allergies.    Social History:  The patient  reports that he quit smoking about 15 years ago. His smoking use included Cigarettes. He has a 110 pack-year smoking history. He quit smokeless tobacco use about 15 years ago. He reports that he does not drink alcohol or use illicit drugs.   Family History:  The patient's family history includes Depression in his  mother; Diabetes in his brother, brother, father, mother, sister, and sister; Heart disease in his father, mother, sister, sister, and sister; Hyperlipidemia in his father, mother, sister, sister, and sister; Hypertension in his father, mother, sister, sister, and sister; Rectal cancer in his mother.    ROS: .   All other systems are reviewed and negative.Unless otherwise mentioned in H&P above.   PHYSICAL EXAM: VS:  Ht 4' 11"  (1.499 m)  Wt 205 lb (92.987 kg)  BMI 41.38 kg/m2 , BMI Body mass index is 41.38 kg/(m^2). GEN: Well nourished, well developed, in no acute distress HEENT: normal Neck: no JVD,  carotid bruits, or masses Cardiac: RRR; no murmurs, rubs, or gallops,no edema  Respiratory:  clear to auscultation bilaterally, normal work of breathing GI: soft, nontender, nondistended, + BS MS: no deformity or atrophyMild ankle edema in the dependent position.  Skin: warm and dry, no rash Neuro:  Strength is diminished  and sensation are intact Psych: euthymic mood, full affect  Recent Labs: 05/12/2015: ALT 62; TSH 7.591* 05/18/2015: Hemoglobin 11.4*; Platelets 240 05/22/2015: BUN 10; Creat 0.60*; Magnesium 1.4*; Potassium 4.3; Sodium 124*    Lipid Panel    Component Value Date/Time   CHOL 132 10/12/2014 0849   TRIG 108 10/12/2014 0849   HDL 42 10/12/2014 0849   CHOLHDL 3.1 10/12/2014 0849   VLDL 22 10/12/2014 0849   LDLCALC 68 10/12/2014 0849      Wt Readings from Last 3 Encounters:  05/23/15 205 lb (92.987 kg)  05/22/15 210 lb (95.255 kg)  05/16/15 216 lb (97.977 kg)      Other studies Reviewed: Additional studies/ records that were reviewed today include: Recent labs.  Review of the above records demonstrates: Mg 1.4, Na of 124.    ASSESSMENT AND PLAN:  1.  Chronic Diastolic CHF: He has diuresed very well from metolazone and low sodium diet. Will stop metolazone and continue Bumex 2 mg in am and 1 mg in pm. He will be given magnesium replacement. Will repeat labs  in one week and see him in two weeks. He is instructed to adhere to low sodium diet and continue to weigh himself daily.    2. Hypertension: Very well controlled. No changes in regimen other than discussed above.    Current medicines are reviewed at length with the patient today.    Labs/ tests ordered today include: BMET and Mg in one week.  No orders of the defined types were placed in this encounter.     Disposition:   FU with cardiology in 2 weeks.   Signed, Michael Sims, NP  05/23/2015 1:04 PM    [facility] [address] [place], [place] [address] Phone: [phone]; Fax: [phone]

## 2015-05-23 NOTE — Addendum Note (Signed)
Addended by: Debbora Lacrosse R on: 05/23/2015 08:27 AM   Modules accepted: Orders

## 2015-05-30 ENCOUNTER — Other Ambulatory Visit (HOSPITAL_COMMUNITY)
Admission: RE | Admit: 2015-05-30 | Discharge: 2015-05-30 | Disposition: A | Discharge status: Home / Self Care | Payer: Medicare Other | Source: Ambulatory Visit | Attending: Adult Health | Admitting: Adult Health

## 2015-05-30 DIAGNOSIS — I5032 Chronic diastolic (congestive) heart failure: Secondary | ICD-10-CM | POA: Insufficient documentation

## 2015-05-30 DIAGNOSIS — I10 Essential (primary) hypertension: Secondary | ICD-10-CM | POA: Diagnosis not present

## 2015-05-30 DIAGNOSIS — Z79899 Other long term (current) drug therapy: Secondary | ICD-10-CM | POA: Diagnosis not present

## 2015-05-30 LAB — BASIC METABOLIC PANEL
ANION GAP: 10 (ref 5–15)
BUN: 13 mg/dL (ref 6–20)
CO2: 25 mmol/L (ref 22–32)
Calcium: 8.6 mg/dL — ABNORMAL LOW (ref 8.9–10.3)
Chloride: 92 mmol/L — ABNORMAL LOW (ref 101–111)
Creatinine, Ser: 0.71 mg/dL (ref 0.61–1.24)
GLUCOSE: 198 mg/dL — AB (ref 65–99)
POTASSIUM: 4.1 mmol/L (ref 3.5–5.1)
Sodium: 127 mmol/L — ABNORMAL LOW (ref 135–145)

## 2015-05-30 LAB — MAGNESIUM: Magnesium: 1.2 mg/dL — ABNORMAL LOW (ref 1.7–2.4)

## 2015-05-31 ENCOUNTER — Telehealth: Payer: Self-pay | Admitting: *Deleted

## 2015-05-31 MED ORDER — MAGNESIUM 400 MG PO TABS: 400.0000 mg | ORAL_TABLET | Freq: Two times a day (BID) | ORAL | Status: DC

## 2015-05-31 NOTE — Telephone Encounter (Signed)
-----   Message from Lendon Colonel, NP sent at 05/30/2015 12:56 PM EDT ----- Increase magnesium to 400 mg BID. Use metolazone PRN only. Continue daily wts. Call for wt gain.

## 2015-05-31 NOTE — Telephone Encounter (Signed)
Patient notified of changes. Orders placed patient voiced understanding. Patient would like to know if this is postponing his clearance for knee surgery. Please advise.

## 2015-06-01 NOTE — Telephone Encounter (Signed)
Uncertain. Will discuss on follow up appt planned with Dr. Harl Bowie. Please make sure he has the appt scheduled with Dr. Harl Bowie

## 2015-06-02 ENCOUNTER — Other Ambulatory Visit: Payer: Self-pay | Admitting: Family Medicine

## 2015-06-06 NOTE — Telephone Encounter (Signed)
Refill appropriate and filled per protocol. 

## 2015-06-07 DIAGNOSIS — L89312 Pressure ulcer of right buttock, stage 2: Secondary | ICD-10-CM | POA: Diagnosis not present

## 2015-06-08 ENCOUNTER — Ambulatory Visit: Payer: Medicare Other | Admitting: Adult Health

## 2015-06-09 ENCOUNTER — Ambulatory Visit (INDEPENDENT_AMBULATORY_CARE_PROVIDER_SITE_OTHER): Payer: Medicare Other | Admitting: Family Medicine

## 2015-06-09 ENCOUNTER — Encounter: Payer: Self-pay | Admitting: Family Medicine

## 2015-06-09 VITALS — BP 138/74 | HR 78 | Temp 98.1°F | Resp 14 | Ht 59.0 in | Wt 214.0 lb

## 2015-06-09 DIAGNOSIS — Z23 Encounter for immunization: Secondary | ICD-10-CM

## 2015-06-09 DIAGNOSIS — R609 Edema, unspecified: Secondary | ICD-10-CM

## 2015-06-09 DIAGNOSIS — L89152 Pressure ulcer of sacral region, stage 2: Secondary | ICD-10-CM

## 2015-06-09 NOTE — Patient Instructions (Addendum)
Referral to [facility] Take the the zaroxolyn water pill Elevate your legs and keep pressure off the right buttocks Continue current medications F/U as previous

## 2015-06-09 NOTE — Assessment & Plan Note (Signed)
Significant edema noted today his weight is also up again. advised to take another tablet of Zaroxolyn

## 2015-06-09 NOTE — Progress Notes (Signed)
Patient ID: Michael [name], male   DOB: 1946/01/14, 69 y.o.   MRN: [id number]   Subjective:    Patient ID: Michael [name], male    DOB: 1946/05/27, 69 y.o.   MRN: [id number]  Patient presents for Open Area to Buttocks and Immunization  patient here with recurrent  Sacral decubitus ulcer. He states a few days ago he noticed a blister and it popped when he was sliding on his shower bench he is unable to reach the area to clean it. He has some discomfort with sitting on the ulcer.     Review Of Systems:  GEN- denies fatigue, fever, weight loss,weakness, recent illness HEENT- denies eye drainage, change in vision, nasal discharge, CVS- denies chest pain, palpitations RESP- denies SOB, cough, wheeze ABD- denies N/V, change in stools, abd pain GU- denies dysuria, hematuria, dribbling, incontinence MSK- denies joint pain, muscle aches, injury Neuro- denies headache, dizziness, syncope, seizure activity       Objective:    BP 138/74 mmHg  Pulse 78  Temp(Src) 98.1 F (36.7 C) (Oral)  Resp 14  Ht 4' 11"  (1.499 m)  Wt 214 lb (97.07 kg)  BMI 43.20 kg/m2 GEN- NAD, alert and oriented x3,walks with walker CVS- RRR, no murmur RESP-CTAB EXT- 1+ edema Skin- stage 2 sacral decub -right buttocks, mild erythema of gluteal cleft ( quarter size ulceration) Pulses- Radial, DP- 1+      Assessment & Plan:      Problem List Items Addressed This Visit    None    Visit Diagnoses    Need for prophylactic vaccination and inoculation against influenza    -  Primary    Relevant Orders    Flu Vaccine QUAD 36+ mos PF IM (Fluarix & Fluzone Quad PF)       Note: This dictation was prepared with Dragon dictation along with smaller Company secretary. Any transcriptional errors that result from this process are unintentional.

## 2015-06-09 NOTE — Assessment & Plan Note (Signed)
Duoderm applied to the ulcer here in the office we did not have any hydrocolloid dressing. We'll get him set up for home health again. Was important to have him not sitting in one position since he has had his knee problems and his leg swelling he is very immobile continues to get breakdown in the saying spot. He has a hospital bed also advised him to alternate pillows to take the pressure off of the sacral region.

## 2015-06-11 DIAGNOSIS — Z794 Long term (current) use of insulin: Secondary | ICD-10-CM | POA: Diagnosis not present

## 2015-06-11 DIAGNOSIS — I5030 Unspecified diastolic (congestive) heart failure: Secondary | ICD-10-CM | POA: Diagnosis not present

## 2015-06-11 DIAGNOSIS — I10 Essential (primary) hypertension: Secondary | ICD-10-CM | POA: Diagnosis not present

## 2015-06-11 DIAGNOSIS — Z4800 Encounter for change or removal of nonsurgical wound dressing: Secondary | ICD-10-CM | POA: Diagnosis not present

## 2015-06-11 DIAGNOSIS — K7581 Nonalcoholic steatohepatitis (NASH): Secondary | ICD-10-CM | POA: Diagnosis not present

## 2015-06-11 DIAGNOSIS — E1140 Type 2 diabetes mellitus with diabetic neuropathy, unspecified: Secondary | ICD-10-CM | POA: Diagnosis not present

## 2015-06-11 DIAGNOSIS — D649 Anemia, unspecified: Secondary | ICD-10-CM | POA: Diagnosis not present

## 2015-06-11 DIAGNOSIS — E1149 Type 2 diabetes mellitus with other diabetic neurological complication: Secondary | ICD-10-CM | POA: Diagnosis not present

## 2015-06-11 DIAGNOSIS — R609 Edema, unspecified: Secondary | ICD-10-CM | POA: Diagnosis not present

## 2015-06-11 DIAGNOSIS — L89152 Pressure ulcer of sacral region, stage 2: Secondary | ICD-10-CM | POA: Diagnosis not present

## 2015-06-12 ENCOUNTER — Ambulatory Visit (INDEPENDENT_AMBULATORY_CARE_PROVIDER_SITE_OTHER): Payer: Medicare Other | Admitting: Cardiology

## 2015-06-12 ENCOUNTER — Encounter: Payer: Self-pay | Admitting: Cardiology

## 2015-06-12 VITALS — BP 120/70 | HR 68 | Ht 59.0 in | Wt 210.4 lb

## 2015-06-12 DIAGNOSIS — I5032 Chronic diastolic (congestive) heart failure: Secondary | ICD-10-CM

## 2015-06-12 DIAGNOSIS — R600 Localized edema: Secondary | ICD-10-CM

## 2015-06-12 MED ORDER — METOLAZONE 2.5 MG PO TABS: ORAL_TABLET | ORAL | Status: DC

## 2015-06-12 MED ORDER — BUMETANIDE 2 MG PO TABS: 2.0000 mg | ORAL_TABLET | Freq: Two times a day (BID) | ORAL | Status: DC

## 2015-06-12 NOTE — Progress Notes (Signed)
Patient ID: Michael [name], male   DOB: [birth year], 69 y.o.   MRN: [id number]     Clinical Summary Michael [name] is a 69 y.o.male seen today for follow up of the following medical problems.   1. Chronic diastolic heart failure - echo 05/2015 with normal LVEF 60-65%, grade II diastolic dysfunction - over the last few months severe edema. Initial diuretics not succesful in diuresing, titrated up to bumex along with intermittent dosing of metolazone.  - he was 224 lbs on our first visit 05/01/15 with severe LE edema. Last visit 05/23/15 with NP Lawrence weight down to 205 lbs. Weight at pcp appointment up to 214 lbs. Weight down to 210 lbs today.  - Na down to 124 with metolazone, have stopped and now trending up. K normal, Cr remains stable. Mg is low and he has been on replacement, recently increased to 434m bid.   - continues to have some LE edema  2. COPD - followed by pcp  3. Hx of NASH  4. Hypothyroid - recent increase with synthroid  Past Medical History  Diagnosis Date  . COPD (chronic obstructive pulmonary disease)   . Type 2 diabetes mellitus   . Hyperlipidemia   . Essential hypertension   . Iron deficiency anemia   . NASH (nonalcoholic steatohepatitis)   . Diabetic neuropathy   . GERD (gastroesophageal reflux disease)   . Arthritis   . Depression   . Diastolic dysfunction   . Left knee DJD      No Known Allergies   Current Outpatient Prescriptions  Medication Sig Dispense Refill  . ACCU-CHEK AVIVA PLUS test strip     . aspirin EC 81 MG tablet Take 81 mg by mouth daily.    . B-D INS SYRINGE 0.5CC/31GX5/16 31G X 5/16" 0.5 ML MISC     . benazepril (LOTENSIN) 40 MG tablet TAKE ONE TABLET BY MOUTH DAILY. 90 tablet 1  . bumetanide (BUMEX) 1 MG tablet Take 2 mg in the AM / continue to take 1 mg in the PM 75 tablet 3  . clobetasol cream (TEMOVATE) 0.05 % APPLY TO AFFECTED AREA 2 TIMES DAILY. 45 g 11  . diclofenac sodium (VOLTAREN) 1 % GEL Apply 4 g topically 4 (four) times  daily. 5 Tube 5  . Ferrous Sulfate (IRON) 325 (65 FE) MG TABS Take 1 tablet by mouth daily.     .Marland Kitchengabapentin (NEURONTIN) 400 MG capsule TAKE ONE CAPSULE BY MOUTH THREE TIMES DAILY. 90 capsule 2  . glipiZIDE (GLUCOTROL) 10 MG tablet TAKE ONE TABLET BY MOUTH TWICE DAILY BEFORE MEALS. 180 tablet 1  . Insulin Glargine (LANTUS) 100 UNIT/ML Solostar Pen Inject 30 Units into the skin at bedtime. 15 mL 11  . levothyroxine (SYNTHROID, LEVOTHROID) 50 MCG tablet Take 1 tablet (50 mcg total) by mouth daily before breakfast. 30 tablet 1  . Magnesium 400 MG TABS Take 400 mg by mouth 2 (two) times daily. 60 tablet 6  . meloxicam (MOBIC) 15 MG tablet Take 15 mg by mouth daily.     . metFORMIN (GLUCOPHAGE) 1000 MG tablet TAKE ONE TABLET BY MOUTH TWICE DAILY WITH A MEAL. 60 tablet 6  . metolazone (ZAROXOLYN) 5 MG tablet TAKE ONE TABLET BY MOUTH WEEKLY AS NEEDED FOR SWELLING 5 tablet 6  . naproxen (NAPROSYN) 375 MG tablet     . nebivolol (BYSTOLIC) 10 MG tablet Take 1 tablet (10 mg total) by mouth daily. 30 tablet 3  . nystatin cream (MYCOSTATIN) Apply 1 application  topically 2 (two) times daily. 45 g 1  . omeprazole (PRILOSEC) 20 MG capsule TAKE ONE CAPSULE BY MOUTH TWICE DAILY. 60 capsule 11  . oxyCODONE-acetaminophen (PERCOCET) 7.5-325 MG per tablet Take 1 tablet by mouth every 6 (six) hours as needed for severe pain. 90 tablet 0  . potassium chloride (K-DUR) 10 MEQ tablet Take 2 tablets in the AM / Take 1 tablet in the PM 90 tablet 3  . pravastatin (PRAVACHOL) 40 MG tablet Take 1 tablet (40 mg total) by mouth every evening. 90 tablet 1   No current facility-administered medications for this visit.     Past Surgical History  Procedure Laterality Date  . Hernia repair    . Shoulder surgery      Rght-rotator cuff  . Lipoma removal      Stomach  . Knee arthroscopy with medial menisectomy Left 11/06/2012    Procedure: KNEE ARTHROSCOPY WITH MEDIAL MENISECTOMY;  Surgeon: Carole Civil, MD;  Location: [facility];  Service: Orthopedics;  Laterality: Left;  . Lumbar laminectomy/decompression microdiscectomy Left 08/30/2013    Procedure: LUMBAR LAMINECTOMY/DECOMPRESSION MICRODISCECTOMY LEFT  LUMBAR TWO THREE;  Surgeon: Otilio Connors, MD;  Location: [place] NEUR[facility] ORS;  Service: Neurosurgery;  Laterality: Left;  . Egd with enteroscopy  2011    [facility]: normal esophagus and stomach. Normal duodenum, jejunum. No evidence of AVMs.   . Colonoscopy  2011    [facility]: normal colon, normal distal ileum  . Colonoscopy with propofol N/A 07/21/2014    Procedure: ATTEMPTED COLONOSCOPY WITH PROPOFOL-HAD TO STOP DUE TO BRADYCARDIA;  Surgeon: Daneil Dolin, MD;  Location: [facility];  Service: Endoscopy;  Laterality: N/A;  . Esophagogastroduodenoscopy (egd) with propofol N/A 07/21/2014    Procedure: ESOPHAGOGASTRODUODENOSCOPY (EGD) WITH PROPOFOL;  Surgeon: Daneil Dolin, MD;  Location: [facility];  Service: Endoscopy;  Laterality: N/A;  . Esophageal biopsy N/A 07/21/2014    Procedure: BIOPSY;  Surgeon: Daneil Dolin, MD;  Location: [facility];  Service: Endoscopy;  Laterality: N/A;     No Known Allergies    Family History  Problem Relation Age of Onset  . Heart disease Mother   . Hyperlipidemia Mother   . Hypertension Mother   . Depression Mother   . Diabetes Mother   . Heart disease Father   . Hypertension Father   . Hyperlipidemia Father   . Diabetes Father   . Heart disease Sister   . Hyperlipidemia Sister   . Hypertension Sister   . Diabetes Sister   . Diabetes Brother   . Heart disease Sister   . Hyperlipidemia Sister   . Hypertension Sister   . Heart disease Sister   . Hyperlipidemia Sister   . Hypertension Sister   . Diabetes Sister   . Diabetes Brother   . Rectal cancer Mother      Social History Michael [name] reports that he quit smoking about 15 years ago. His smoking use included Cigarettes. He has a 110 pack-year smoking history. He quit smokeless tobacco use about 15 years ago. Michael [name]  reports that he does not drink alcohol.   Review of Systems CONSTITUTIONAL: No weight loss, fever, chills, weakness or fatigue.  HEENT: Eyes: No visual loss, blurred vision, double vision or yellow sclerae.No hearing loss, sneezing, congestion, runny nose or sore throat.  SKIN: No rash or itching.  CARDIOVASCULAR: per HPI RESPIRATORY: No cough or sputum.  GASTROINTESTINAL: No anorexia, nausea, vomiting or diarrhea. No abdominal pain or blood.  GENITOURINARY: No  burning on urination, no polyuria NEUROLOGICAL: No headache, dizziness, syncope, paralysis, ataxia, numbness or tingling in the extremities. No change in bowel or bladder control.  MUSCULOSKELETAL: No muscle, back pain, joint pain or stiffness.  LYMPHATICS: No enlarged nodes. No history of splenectomy.  PSYCHIATRIC: No history of depression or anxiety.  ENDOCRINOLOGIC: No reports of sweating, cold or heat intolerance. No polyuria or polydipsia.  Marland Kitchen   Physical Examination Filed Vitals:   06/12/15 1537  BP: 120/70  Pulse: 68   Filed Vitals:   06/12/15 1537  Height: 4' 11"  (1.499 m)  Weight: 210 lb 6.4 oz (95.437 kg)    Gen: resting comfortably, no acute distress HEENT: no scleral icterus, pupils equal round and reactive, no palptable cervical adenopathy,  CV: RRR, no m/r/g, no JVD Resp: Clear to auscultation bilaterally GI: abdomen is soft, non-tender, non-distended, normal bowel sounds, no hepatosplenomegaly MSK: extremities are warm, 2+ bilateral LE edema  Skin: warm, no rash Neuro:  no focal deficits Psych: appropriate affect   Diagnostic Studies  05/2015 echo Study Conclusions  - Left ventricle: The cavity size was normal. Wall thickness was increased in a pattern of mild LVH. Systolic function was normal. The estimated ejection fraction was in the range of 60% to 65%. Wall motion was normal; there were no regional wall motion abnormalities. Features are consistent with a pseudonormal  left ventricular filling pattern, with concomitant abnormal relaxation and increased filling pressure (grade 2 diastolic dysfunction). Doppler parameters are consistent with high ventricular filling pressure. - Aortic valve: Mildly calcified annulus. - Mitral valve: Mildly calcified annulus. There was trivial regurgitation. - Left atrium: The atrium was mildly to moderately dilated. Volume/bsa, S: 34.4 ml/m^2. Volume/bsa, ES (1-plane Simpson&'s, A4C): 31.3 ml/m^2.   Assessment and Plan  1. LE edema - did not responsd to lasix or torsemide, currently on bumex with prn metolazone. Significant hyponatremia when he took his metolazone more frequently - will increase bumex to 40m bid, decrease metolazone to 2.534mprn, at most to take once a week   F/u 2 weeks. Will need repeat BMET and Mg at that appointment       JoArnoldo LenisM.D.

## 2015-06-12 NOTE — Patient Instructions (Signed)
Your physician recommends that you schedule a follow-up appointment in: 2 WEEKS WITH DR. BRANCH OR Jory Sims, NP IN [place]   Your physician has recommended you make the following change in your medication:   INCREASE BUMEX 2 MG TWICE DAILY  DECREASE METOLAZONE 2.5 MG WEEKLY AS NEEDED FOR SEVERE SWELLING/WEIGHT GAIN  Thank you for choosing [facility]!!

## 2015-06-13 DIAGNOSIS — E1140 Type 2 diabetes mellitus with diabetic neuropathy, unspecified: Secondary | ICD-10-CM | POA: Diagnosis not present

## 2015-06-13 DIAGNOSIS — I10 Essential (primary) hypertension: Secondary | ICD-10-CM | POA: Diagnosis not present

## 2015-06-13 DIAGNOSIS — R609 Edema, unspecified: Secondary | ICD-10-CM | POA: Diagnosis not present

## 2015-06-13 DIAGNOSIS — E1149 Type 2 diabetes mellitus with other diabetic neurological complication: Secondary | ICD-10-CM | POA: Diagnosis not present

## 2015-06-13 DIAGNOSIS — I5030 Unspecified diastolic (congestive) heart failure: Secondary | ICD-10-CM | POA: Diagnosis not present

## 2015-06-13 DIAGNOSIS — Z794 Long term (current) use of insulin: Secondary | ICD-10-CM | POA: Diagnosis not present

## 2015-06-13 DIAGNOSIS — Z4800 Encounter for change or removal of nonsurgical wound dressing: Secondary | ICD-10-CM | POA: Diagnosis not present

## 2015-06-13 DIAGNOSIS — D649 Anemia, unspecified: Secondary | ICD-10-CM | POA: Diagnosis not present

## 2015-06-13 DIAGNOSIS — K7581 Nonalcoholic steatohepatitis (NASH): Secondary | ICD-10-CM | POA: Diagnosis not present

## 2015-06-13 DIAGNOSIS — L89152 Pressure ulcer of sacral region, stage 2: Secondary | ICD-10-CM | POA: Diagnosis not present

## 2015-06-19 ENCOUNTER — Telehealth: Payer: Self-pay | Admitting: Family Medicine

## 2015-06-19 MED ORDER — OXYCODONE-ACETAMINOPHEN 7.5-325 MG PO TABS: 1.0000 | ORAL_TABLET | Freq: Four times a day (QID) | ORAL | Status: DC | PRN

## 2015-06-19 NOTE — Telephone Encounter (Signed)
ok 

## 2015-06-19 NOTE — Telephone Encounter (Signed)
PT is calling for a refill of pain medication.  Ph: [phone]

## 2015-06-19 NOTE — Telephone Encounter (Signed)
Ok to refill??  Last office visit 06/09/2015.  Last refill 05/22/2015.

## 2015-06-19 NOTE — Telephone Encounter (Signed)
Prescription printed and patient made aware to come to office to pick up after 2pm on 06/19/2015.

## 2015-06-20 DIAGNOSIS — R609 Edema, unspecified: Secondary | ICD-10-CM | POA: Diagnosis not present

## 2015-06-20 DIAGNOSIS — I5030 Unspecified diastolic (congestive) heart failure: Secondary | ICD-10-CM | POA: Diagnosis not present

## 2015-06-20 DIAGNOSIS — D649 Anemia, unspecified: Secondary | ICD-10-CM | POA: Diagnosis not present

## 2015-06-20 DIAGNOSIS — K7581 Nonalcoholic steatohepatitis (NASH): Secondary | ICD-10-CM | POA: Diagnosis not present

## 2015-06-20 DIAGNOSIS — E1140 Type 2 diabetes mellitus with diabetic neuropathy, unspecified: Secondary | ICD-10-CM | POA: Diagnosis not present

## 2015-06-20 DIAGNOSIS — L89152 Pressure ulcer of sacral region, stage 2: Secondary | ICD-10-CM | POA: Diagnosis not present

## 2015-06-20 DIAGNOSIS — Z794 Long term (current) use of insulin: Secondary | ICD-10-CM | POA: Diagnosis not present

## 2015-06-20 DIAGNOSIS — I10 Essential (primary) hypertension: Secondary | ICD-10-CM | POA: Diagnosis not present

## 2015-06-20 DIAGNOSIS — Z4800 Encounter for change or removal of nonsurgical wound dressing: Secondary | ICD-10-CM | POA: Diagnosis not present

## 2015-06-20 DIAGNOSIS — E1149 Type 2 diabetes mellitus with other diabetic neurological complication: Secondary | ICD-10-CM | POA: Diagnosis not present

## 2015-06-22 ENCOUNTER — Encounter (HOSPITAL_COMMUNITY): Payer: Self-pay | Admitting: Hematology & Oncology

## 2015-06-22 ENCOUNTER — Encounter (HOSPITAL_COMMUNITY): Payer: Medicare Other | Attending: Oncology | Admitting: Hematology & Oncology

## 2015-06-22 ENCOUNTER — Encounter (HOSPITAL_COMMUNITY): Payer: Medicare Other

## 2015-06-22 VITALS — BP 117/63 | HR 76 | Temp 98.5°F | Resp 18 | Wt 212.7 lb

## 2015-06-22 DIAGNOSIS — I5030 Unspecified diastolic (congestive) heart failure: Secondary | ICD-10-CM | POA: Diagnosis not present

## 2015-06-22 DIAGNOSIS — K7581 Nonalcoholic steatohepatitis (NASH): Secondary | ICD-10-CM | POA: Diagnosis not present

## 2015-06-22 DIAGNOSIS — D509 Iron deficiency anemia, unspecified: Secondary | ICD-10-CM

## 2015-06-22 DIAGNOSIS — L89152 Pressure ulcer of sacral region, stage 2: Secondary | ICD-10-CM | POA: Diagnosis not present

## 2015-06-22 DIAGNOSIS — R609 Edema, unspecified: Secondary | ICD-10-CM | POA: Diagnosis not present

## 2015-06-22 DIAGNOSIS — I10 Essential (primary) hypertension: Secondary | ICD-10-CM | POA: Diagnosis not present

## 2015-06-22 DIAGNOSIS — Z794 Long term (current) use of insulin: Secondary | ICD-10-CM | POA: Diagnosis not present

## 2015-06-22 DIAGNOSIS — D649 Anemia, unspecified: Secondary | ICD-10-CM | POA: Insufficient documentation

## 2015-06-22 DIAGNOSIS — E1140 Type 2 diabetes mellitus with diabetic neuropathy, unspecified: Secondary | ICD-10-CM | POA: Diagnosis not present

## 2015-06-22 DIAGNOSIS — E1149 Type 2 diabetes mellitus with other diabetic neurological complication: Secondary | ICD-10-CM | POA: Diagnosis not present

## 2015-06-22 DIAGNOSIS — Z4800 Encounter for change or removal of nonsurgical wound dressing: Secondary | ICD-10-CM | POA: Diagnosis not present

## 2015-06-22 LAB — CBC WITH DIFFERENTIAL/PLATELET
BASOS ABS: 0.1 10*3/uL (ref 0.0–0.1)
BASOS PCT: 2 %
EOS ABS: 0.1 10*3/uL (ref 0.0–0.7)
EOS PCT: 2 %
HEMATOCRIT: 34.6 % — AB (ref 39.0–52.0)
Hemoglobin: 11.5 g/dL — ABNORMAL LOW (ref 13.0–17.0)
Lymphocytes Relative: 30 %
Lymphs Abs: 1.4 10*3/uL (ref 0.7–4.0)
MCH: 26.1 pg (ref 26.0–34.0)
MCHC: 33.2 g/dL (ref 30.0–36.0)
MCV: 78.5 fL (ref 78.0–100.0)
MONO ABS: 0.3 10*3/uL (ref 0.1–1.0)
MONOS PCT: 6 %
NEUTROS ABS: 2.9 10*3/uL (ref 1.7–7.7)
Neutrophils Relative %: 60 %
PLATELETS: 226 10*3/uL (ref 150–400)
RBC: 4.41 MIL/uL (ref 4.22–5.81)
RDW: 20.1 % — AB (ref 11.5–15.5)
WBC: 4.8 10*3/uL (ref 4.0–10.5)

## 2015-06-22 LAB — FERRITIN: Ferritin: 107 ng/mL (ref 24–336)

## 2015-06-22 NOTE — Patient Instructions (Signed)
[place] at [facility] Discharge Instructions  RECOMMENDATIONS MADE BY THE CONSULTANT AND ANY TEST RESULTS WILL BE SENT TO YOUR REFERRING PHYSICIAN.  Exam and discussion by Dr. Whitney Muse. Will let you know if you need any additional iron infusions. Report increased shortness of breath, increased fatigue or other concerns.  Labs every 6 weeks and office visit in 4 months.  Thank you for choosing [place] at [facility] to provide your oncology and hematology care.  To afford each patient quality time with our provider, please arrive at least 15 minutes before your scheduled appointment time.    You need to re-schedule your appointment should you arrive 10 or more minutes late.  We strive to give you quality time with our providers, and arriving late affects you and other patients whose appointments are after yours.  Also, if you no show three or more times for appointments you may be dismissed from the clinic at the providers discretion.     Again, thank you for choosing [facility].  Our hope is that these requests will decrease the amount of time that you wait before being seen by our physicians.       _____________________________________________________________  Should you have questions after your visit to [facility], please contact our office at [phone] between the hours of 8:30 a.m. and 4:30 p.m.  Voicemails left after 4:30 p.m. will not be returned until the following business day.  For prescription refill requests, have your pharmacy contact our office.

## 2015-06-22 NOTE — Progress Notes (Unsigned)
Labs drawn

## 2015-06-22 NOTE — Progress Notes (Signed)
[facility] Hematology/Oncology Consultation   Name: Michael [name]      MRN: [id number]     Date: 06/22/2015 Time:10:24 AM   REFERRING PHYSICIAN:  Arther Abbott, MD  REASON FOR CONSULT:  Anemia   DIAGNOSIS:  Microcytic, hypochromic anemia Iron deficiency IV iron replacement  HISTORY OF PRESENT ILLNESS:   Michael [name] is a 69 year old white man with a past medical history significant for DM, spinal stenosis, PN, morbid obesity, NASH, hypothyroidism, hyperlipidemia, hepatomegaly, diastolic cardiac dysfunction, DDD and documented history of iron deficiency anemia who was referred to the [facility] for anemia in the pre-op setting with request for Procrit injections to increase Hgb prior to total knee arthroplasty.  Chart is reviewed.  Of note, the patient's Hgb was WNL until about Sept 2013 at which time he was noted to be anemic and persistently anemic from then on. After review of labs I note a ferritin of 7 in Sept 2015 when he was evaluated by GI.  EGD and Colonoscopy were recommended and on 07/21/2014, the patient underwent EGD/Colonoscopy.  EGD demonstrated inflammatory changes associated with NSAID use.  Colonoscopy was aborted due to bradycardia requiring 24 hour observation in the inpatient setting and work-up by cardiology.  Renal function is WNL.    He reports a history of anemia that has been worked-up by [place].  He describes his anemia cause being from small bowel AVMs.  He reports a history of PRBC transfusion in [place], [place] for a Hgb of 4.5 g/dL.  He denies any blood in his stools or dark sticky stools.  He denies any B symptoms and he has actually gains weight during chart review, but I suspect most of that is from dependent edema.  He denies any Pica or ice cravings.    The patient did not have his knee surgery with Dr. Aline Brochure due to "too much fluid." He notes that he has been waiting for this surgery for a long time and he is ready to  get it done.  He is present in a wheelchair today.  He wonders why he has to continue following with [place] since his blood counts are better.  PAST MEDICAL HISTORY:   Past Medical History  Diagnosis Date  . COPD (chronic obstructive pulmonary disease)   . Type 2 diabetes mellitus   . Hyperlipidemia   . Essential hypertension   . Iron deficiency anemia   . NASH (nonalcoholic steatohepatitis)   . Diabetic neuropathy   . GERD (gastroesophageal reflux disease)   . Arthritis   . Depression   . Diastolic dysfunction   . Left knee DJD     ALLERGIES: No Known Allergies    MEDICATIONS: I have reviewed the patient's current medications.    Current Outpatient Prescriptions on File Prior to Visit  Medication Sig Dispense Refill  . ACCU-CHEK AVIVA PLUS test strip     . aspirin EC 81 MG tablet Take 81 mg by mouth daily.    . B-D INS SYRINGE 0.5CC/31GX5/16 31G X 5/16" 0.5 ML MISC     . benazepril (LOTENSIN) 40 MG tablet TAKE ONE TABLET BY MOUTH DAILY. 90 tablet 1  . bumetanide (BUMEX) 2 MG tablet Take 1 tablet (2 mg total) by mouth 2 (two) times daily. 60 tablet 1  . clobetasol cream (TEMOVATE) 0.05 % APPLY TO AFFECTED AREA 2 TIMES DAILY. 45 g 11  . diclofenac sodium (VOLTAREN) 1 % GEL Apply 4  g topically 4 (four) times daily. 5 Tube 5  . Ferrous Sulfate (IRON) 325 (65 FE) MG TABS Take 1 tablet by mouth daily.     Marland Kitchen gabapentin (NEURONTIN) 400 MG capsule TAKE ONE CAPSULE BY MOUTH THREE TIMES DAILY. 90 capsule 2  . glipiZIDE (GLUCOTROL) 10 MG tablet TAKE ONE TABLET BY MOUTH TWICE DAILY BEFORE MEALS. 180 tablet 1  . Insulin Glargine (LANTUS) 100 UNIT/ML Solostar Pen Inject 30 Units into the skin at bedtime. 15 mL 11  . levothyroxine (SYNTHROID, LEVOTHROID) 50 MCG tablet Take 1 tablet (50 mcg total) by mouth daily before breakfast. 30 tablet 1  . Magnesium 400 MG TABS Take 400 mg by mouth 2 (two) times daily. 60 tablet 6  . meloxicam (MOBIC) 15 MG tablet Take 15 mg by mouth daily.     . metFORMIN  (GLUCOPHAGE) 1000 MG tablet TAKE ONE TABLET BY MOUTH TWICE DAILY WITH A MEAL. 60 tablet 6  . metolazone (ZAROXOLYN) 2.5 MG tablet TAKE 1 TAB WEEKLY AS NEEDED FOR SEVERE SWELLING/WEIGHT GAIN 90 tablet 3  . nebivolol (BYSTOLIC) 10 MG tablet Take 1 tablet (10 mg total) by mouth daily. 30 tablet 3  . nystatin cream (MYCOSTATIN) Apply 1 application topically 2 (two) times daily. 45 g 1  . omeprazole (PRILOSEC) 20 MG capsule TAKE ONE CAPSULE BY MOUTH TWICE DAILY. 60 capsule 11  . oxyCODONE-acetaminophen (PERCOCET) 7.5-325 MG per tablet Take 1 tablet by mouth every 6 (six) hours as needed for severe pain. 90 tablet 0  . potassium chloride (K-DUR) 10 MEQ tablet Take 2 tablets in the AM / Take 1 tablet in the PM 90 tablet 3  . pravastatin (PRAVACHOL) 40 MG tablet Take 1 tablet (40 mg total) by mouth every evening. 90 tablet 1   No current facility-administered medications on file prior to visit.     PAST SURGICAL HISTORY Past Surgical History  Procedure Laterality Date  . Hernia repair    . Shoulder surgery      Rght-rotator cuff  . Lipoma removal      Stomach  . Knee arthroscopy with medial menisectomy Left 11/06/2012    Procedure: KNEE ARTHROSCOPY WITH MEDIAL MENISECTOMY;  Surgeon: Carole Civil, MD;  Location: [facility];  Service: Orthopedics;  Laterality: Left;  . Lumbar laminectomy/decompression microdiscectomy Left 08/30/2013    Procedure: LUMBAR LAMINECTOMY/DECOMPRESSION MICRODISCECTOMY LEFT  LUMBAR TWO THREE;  Surgeon: Otilio Connors, MD;  Location: [place] [facility]UR[facility];  Service: Neurosurgery;  Laterality: Left;  . Egd with enteroscopy  2011    [facility]: normal esophagus and stomach. Normal duodenum, jejunum. No evidence of AVMs.   . Colonoscopy  2011    [facility]: normal colon, normal distal ileum  . Colonoscopy with propofol N/A 07/21/2014    Procedure: ATTEMPTED COLONOSCOPY WITH PROPOFOL-HAD TO STOP DUE TO BRADYCARDIA;  Surgeon: Daneil Dolin, MD;  Location: [facility];  Service: Endoscopy;   Laterality: N/A;  . Esophagogastroduodenoscopy (egd) with propofol N/A 07/21/2014    Procedure: ESOPHAGOGASTRODUODENOSCOPY (EGD) WITH PROPOFOL;  Surgeon: Daneil Dolin, MD;  Location: [facility];  Service: Endoscopy;  Laterality: N/A;  . Esophageal biopsy N/A 07/21/2014    Procedure: BIOPSY;  Surgeon: Daneil Dolin, MD;  Location: [facility];  Service: Endoscopy;  Laterality: N/A;    FAMILY HISTORY: Family History  Problem Relation Age of Onset  . Heart disease Mother   . Hyperlipidemia Mother   . Hypertension Mother   . Depression Mother   . Diabetes Mother   .  Heart disease Father   . Hypertension Father   . Hyperlipidemia Father   . Diabetes Father   . Heart disease Sister   . Hyperlipidemia Sister   . Hypertension Sister   . Diabetes Sister   . Diabetes Brother   . Heart disease Sister   . Hyperlipidemia Sister   . Hypertension Sister   . Heart disease Sister   . Hyperlipidemia Sister   . Hypertension Sister   . Diabetes Sister   . Diabetes Brother   . Rectal cancer Mother     SOCIAL HISTORY:  reports that he quit smoking about 15 years ago. His smoking use included Cigarettes. He has a 110 pack-year smoking history. He quit smokeless tobacco use about 15 years ago. He reports that he does not drink alcohol or use illicit drugs.  PERFORMANCE STATUS: The patient's performance status is 2 - Symptomatic, <50% confined to bed   PHYSICAL EXAM: Most Recent Vital Signs: Blood pressure 117/63, pulse 76, temperature 98.5 F (36.9 C), temperature source Oral, resp. rate 18, weight 212 lb 11.2 oz (96.48 kg), SpO2 98 %. General appearance: alert, cooperative, no distress, morbidly obese and accompanied by friend, Faith.  Present in Wheelchair Head: Normocephalic, without obvious abnormality, atraumatic Eyes: negative findings: lids and lashes normal, conjunctivae and sclerae normal, corneas clear and pupils equal, round, reactive to light and accomodation Neck: no adenopathy and  supple, symmetrical, trachea midline Lungs: clear to auscultation bilaterally and normal percussion bilaterally Heart: regular rate and rhythm, S1, S2 normal, no murmur, click, rub or gallop Abdomen: normal findings: bowel sounds normal and soft, non-tender and abnormal findings:  obese Extremities: edema dependent bilaterally 3+ pitting, osteoarthritis of fingers, particularly at the DIPs. Skin: Skin color, texture, turgor normal. No rashes or lesions Lymph nodes: Cervical, supraclavicular, and axillary nodes normal. Neurologic: Grossly normal  LABORATORY DATA:  I have reviewed the data below as listed. CBC    Component Value Date/Time   WBC 4.8 06/22/2015 0951   RBC 4.41 06/22/2015 0951   RBC 4.35 05/02/2015 1330   HGB 11.5* 06/22/2015 0951   HCT 34.6* 06/22/2015 0951   PLT 226 06/22/2015 0951   MCV 78.5 06/22/2015 0951   MCH 26.1 06/22/2015 0951   MCHC 33.2 06/22/2015 0951   RDW 20.1* 06/22/2015 0951   LYMPHSABS 1.4 06/22/2015 0951   MONOABS 0.3 06/22/2015 0951   EOSABS 0.1 06/22/2015 0951   BASOSABS 0.1 06/22/2015 0951    Lab Results  Component Value Date   IRON 20* 05/02/2015   TIBC 434 05/02/2015   FERRITIN 8* 05/02/2015     Chemistry      Component Value Date/Time   NA 127* 05/30/2015 1030   K 4.1 05/30/2015 1030   CL 92* 05/30/2015 1030   CO2 25 05/30/2015 1030   BUN 13 05/30/2015 1030   CREATININE 0.71 05/30/2015 1030   CREATININE 0.60* 05/22/2015 0935      Component Value Date/Time   CALCIUM 8.6* 05/30/2015 1030   ALKPHOS 66 05/12/2015 1355   AST 54* 05/12/2015 1355   ALT 62 05/12/2015 1355   BILITOT 0.3 05/12/2015 1355        PATHOLOGY:  None   ASSESSMENT:  1. Microcytic, hypochromic anemia, one ferrous sulfate 325 mg with poor compliance. 2. Left knee pain, causing debility 3. LE edema, dependent 4. Osteoarthritis of fingers, particularly DIP 5. CHF  Patient Active Problem List   Diagnosis Date Noted  . Diastolic CHF 97/10/6376  .  Diastolic dysfunction 58/85/0277  .  Venous stasis dermatitis 04/20/2015  . Stage II pressure ulcer of sacral region 02/13/2015  . Atypical nevi 01/18/2015  . Intertriginous candidiasis 12/23/2014  . Hypothyroidism 11/30/2014  . Bradycardia 07/21/2014  . Sinus pause 07/21/2014  . Liver fibrosis 06/29/2014  . IDA (iron deficiency anemia) 06/29/2014  . Iron deficiency anemia 05/31/2014  . Hepatomegaly 05/25/2014  . Difficulty in walking(719.7) 05/03/2014  . Knee pain, chronic 05/03/2014  . Stiffness of joint, not elsewhere classified, pelvic region and thigh 05/03/2014  . Dermatitis 04/12/2014  . HNP (herniated nucleus pulposus), lumbar 08/30/2013  . Spinal stenosis of lumbar region 07/06/2013  . Carpal tunnel syndrome 04/13/2013  . Peripheral edema 04/13/2013  . Status post arthroscopy of left knee 04/13/2013  . DDD (degenerative disc disease), lumbosacral 04/13/2013  . Peripheral neuropathy 01/05/2013  . S/P arthroscopy of left knee 11/09/2012  . Osteoarthritis of left knee 10/27/2012  . Meniscus tear 10/07/2012  . Patellar tendonitis 07/01/2012  . NASH (nonalcoholic steatohepatitis) 05/22/2007  . Type II diabetes mellitus with neurological manifestations 05/20/2007  . Hyperlipidemia 10/15/2006  . OBESITY, MORBID 10/15/2006  . Essential hypertension 10/15/2006  . GERD 10/15/2006   I reviewed with the patient that his blood counts are better after iron replacement. From a hematologic perspective he was okay to proceed with surgery. Unfortunately his cardiac function needs to be better prior to surgery.  I advised that he should continue follow-up with [place] as I anticipate his iron deficiency is from his history of AVMs. In the interval between now and when he has his knee replacement he could certainly become quite anemic again. I have recommended following a CBC and ferritin level every 6 weeks. Based upon the stability of these labs we can space those checks out over time. I  anticipate seeing him back in 4 months for an office visit.  All questions were answered. The patient knows to call the clinic with any problems, questions or concerns. We can certainly see the patient much sooner if necessary.   This document serves as a record of services personally performed by Ancil Linsey, MD. It was created on her behalf by Janace Hoard, a trained medical scribe. The creation of this record is based on the scribe's personal observations and the provider's statements to them. This document has been checked and approved by the attending provider.  I have reviewed the above documentation for accuracy and completeness, and I agree with the above.  This note is electronically signed.  Kelby Fam. Whitney Muse, MD

## 2015-06-26 ENCOUNTER — Encounter: Payer: Self-pay | Admitting: Adult Health

## 2015-06-26 ENCOUNTER — Telehealth: Payer: Self-pay | Admitting: Orthopedic Surgery

## 2015-06-26 ENCOUNTER — Ambulatory Visit (INDEPENDENT_AMBULATORY_CARE_PROVIDER_SITE_OTHER): Payer: Medicare Other | Admitting: Adult Health

## 2015-06-26 VITALS — BP 98/52 | HR 77 | Ht 59.0 in | Wt 214.0 lb

## 2015-06-26 DIAGNOSIS — I10 Essential (primary) hypertension: Secondary | ICD-10-CM

## 2015-06-26 DIAGNOSIS — L89152 Pressure ulcer of sacral region, stage 2: Secondary | ICD-10-CM | POA: Diagnosis not present

## 2015-06-26 DIAGNOSIS — I5030 Unspecified diastolic (congestive) heart failure: Secondary | ICD-10-CM | POA: Diagnosis not present

## 2015-06-26 DIAGNOSIS — I5032 Chronic diastolic (congestive) heart failure: Secondary | ICD-10-CM | POA: Diagnosis not present

## 2015-06-26 DIAGNOSIS — R609 Edema, unspecified: Secondary | ICD-10-CM | POA: Diagnosis not present

## 2015-06-26 NOTE — Telephone Encounter (Signed)
Routing to Dr Harrison 

## 2015-06-26 NOTE — Patient Instructions (Signed)
Your physician recommends that you continue on your current medications as directed. Please refer to the Current Medication list given to you today.     Your physician recommends that you schedule a follow-up appointment in: as needed      Thank you for choosing [facility] !

## 2015-06-26 NOTE — Telephone Encounter (Signed)
Patient called to request advice and/or follow up appointment for left knee, for which his total knee surgery was cancelled in June, due to medical issues. He states "all of that is fixed now" - he is asking if he may either schedule the surgery, or schedule a cortisone injection if Dr Aline Brochure thinks that may help his pain.  Please advise, ph# [phone]

## 2015-06-26 NOTE — Progress Notes (Signed)
Cardiology Office Note   Date:  06/26/2015   ID:  Hunt, Zajicek 28-Oct-1945, MRN [id number]  PCP:  Vic Blackbird, MD  Cardiologist:  Harl Bowie ([facility])/ Jory Sims, NP   Chief Complaint  Patient presents with  . Coronary Artery Disease  . Hypertension      History of Present Illness: Michael [name] is a 69 y.o. male who presents for close followup in the setting of chronic diastolic heart failure, grade 1 diastolic dysfunction, with history of hypertension, NASH, and COPD. He was last seen by Dr. Harl Bowie on 06/12/2015, at that time.  He was undergoing treatment for lower extremity edema, which was not responsive to Lasix or torsemide.  He was placed on Bumex and when necessary metolazone.  It was noted on followup labs that he had hyponatremia, when he took his metolazone.  Bumex was increased to 2 mg twice a day and metolazone was decreased to 2.5 mg when necessary, to be taken at most once a week.  The patient was to follow up with a repeat BMET and Mg on this appointment. Prior labs included sodium 127, potassium 4.1, chloride 92, CO2 25, BUN 13, creatinine 0.71 Weight  time of last visit 210 pounds.  He comes today angry that he continues to retain fluid.  He has chosen not to restrict salt in his diet.  He states that he is wheelchair-bound and will not eat what is most convenient for him to prepare.  He does not want to change any of his medication regimens, which will require him to urinate more as this is difficult for him to do in a wheelchair.  Past Medical History  Diagnosis Date  . COPD (chronic obstructive pulmonary disease)   . Type 2 diabetes mellitus   . Hyperlipidemia   . Essential hypertension   . Iron deficiency anemia   . NASH (nonalcoholic steatohepatitis)   . Diabetic neuropathy   . GERD (gastroesophageal reflux disease)   . Arthritis   . Depression   . Diastolic dysfunction   . Left knee DJD     Past Surgical History  Procedure Laterality Date  .  Hernia repair    . Shoulder surgery      Rght-rotator cuff  . Lipoma removal      Stomach  . Knee arthroscopy with medial menisectomy Left 11/06/2012    Procedure: KNEE ARTHROSCOPY WITH MEDIAL MENISECTOMY;  Surgeon: Carole Civil, MD;  Location: [facility];  Service: Orthopedics;  Laterality: Left;  . Lumbar laminectomy/decompression microdiscectomy Left 08/30/2013    Procedure: LUMBAR LAMINECTOMY/DECOMPRESSION MICRODISCECTOMY LEFT  LUMBAR TWO THREE;  Surgeon: Otilio Connors, MD;  Location: [place] [facility];  Service: Neurosurgery;  Laterality: Left;  . Egd with enteroscopy  2011    [facility]: normal esophagus and stomach. Normal duodenum, jejunum. No evidence of AVMs.   . Colonoscopy  2011    [facility]: normal colon, normal distal ileum  . Colonoscopy with propofol N/A 07/21/2014    Procedure: ATTEMPTED COLONOSCOPY WITH PROPOFOL-HAD TO STOP DUE TO BRADYCARDIA;  Surgeon: Daneil Dolin, MD;  Location: [facility];  Service: Endoscopy;  Laterality: N/A;  . Esophagogastroduodenoscopy (egd) with propofol N/A 07/21/2014    Procedure: ESOPHAGOGASTRODUODENOSCOPY (EGD) WITH PROPOFOL;  Surgeon: Daneil Dolin, MD;  Location: [facility];  Service: Endoscopy;  Laterality: N/A;  . Esophageal biopsy N/A 07/21/2014    Procedure: BIOPSY;  Surgeon: Daneil Dolin, MD;  Location: [facility];  Service: Endoscopy;  Laterality: N/A;  Current Outpatient Prescriptions  Medication Sig Dispense Refill  . ACCU-CHEK AVIVA PLUS test strip     . aspirin EC 81 MG tablet Take 81 mg by mouth daily.    . B-D INS SYRINGE 0.5CC/31GX5/16 31G X 5/16" 0.5 ML MISC     . benazepril (LOTENSIN) 40 MG tablet TAKE ONE TABLET BY MOUTH DAILY. 90 tablet 1  . bumetanide (BUMEX) 2 MG tablet Take 1 tablet (2 mg total) by mouth 2 (two) times daily. 60 tablet 1  . clobetasol cream (TEMOVATE) 0.05 % APPLY TO AFFECTED AREA 2 TIMES DAILY. 45 g 11  . diclofenac sodium (VOLTAREN) 1 % GEL Apply 4 g topically 4 (four) times daily. 5 Tube 5  . Ferrous  Sulfate (IRON) 325 (65 FE) MG TABS Take 1 tablet by mouth daily.     Marland Kitchen gabapentin (NEURONTIN) 400 MG capsule TAKE ONE CAPSULE BY MOUTH THREE TIMES DAILY. 90 capsule 2  . glipiZIDE (GLUCOTROL) 10 MG tablet TAKE ONE TABLET BY MOUTH TWICE DAILY BEFORE MEALS. 180 tablet 1  . Insulin Glargine (LANTUS) 100 UNIT/ML Solostar Pen Inject 30 Units into the skin at bedtime. 15 mL 11  . levothyroxine (SYNTHROID, LEVOTHROID) 50 MCG tablet Take 1 tablet (50 mcg total) by mouth daily before breakfast. 30 tablet 1  . Magnesium 400 MG TABS Take 400 mg by mouth 2 (two) times daily. 60 tablet 6  . meloxicam (MOBIC) 15 MG tablet Take 15 mg by mouth daily.     . metFORMIN (GLUCOPHAGE) 1000 MG tablet TAKE ONE TABLET BY MOUTH TWICE DAILY WITH A MEAL. 60 tablet 6  . metolazone (ZAROXOLYN) 2.5 MG tablet TAKE 1 TAB WEEKLY AS NEEDED FOR SEVERE SWELLING/WEIGHT GAIN 90 tablet 3  . nebivolol (BYSTOLIC) 10 MG tablet Take 1 tablet (10 mg total) by mouth daily. 30 tablet 3  . nystatin cream (MYCOSTATIN) Apply 1 application topically 2 (two) times daily. 45 g 1  . omeprazole (PRILOSEC) 20 MG capsule TAKE ONE CAPSULE BY MOUTH TWICE DAILY. 60 capsule 11  . oxyCODONE-acetaminophen (PERCOCET) 7.5-325 MG per tablet Take 1 tablet by mouth every 6 (six) hours as needed for severe pain. 90 tablet 0  . potassium chloride (K-DUR) 10 MEQ tablet Take 2 tablets in the AM / Take 1 tablet in the PM 90 tablet 3  . pravastatin (PRAVACHOL) 40 MG tablet Take 1 tablet (40 mg total) by mouth every evening. 90 tablet 1   No current facility-administered medications for this visit.    Allergies:   Review of patient's allergies indicates no known allergies.    Social History:  The patient  reports that he quit smoking about 15 years ago. His smoking use included Cigarettes. He has a 110 pack-year smoking history. He quit smokeless tobacco use about 15 years ago. He reports that he does not drink alcohol or use illicit drugs.   Family History:  The  patient's family history includes Depression in his mother; Diabetes in his brother, brother, father, mother, sister, and sister; Heart disease in his father, mother, sister, sister, and sister; Hyperlipidemia in his father, mother, sister, sister, and sister; Hypertension in his father, mother, sister, sister, and sister; Rectal cancer in his mother.    ROS: All other systems are reviewed and negative. Unless otherwise mentioned in H&P    PHYSICAL EXAM: VS:  There were no vitals taken for this visit. , BMI There is no weight on file to calculate BMI. GEN: Well nourished, well developed, in no acute distress HEENT:  normal Neck: no JVD, carotid bruits, or masses Cardiac: RRR; no murmurs, rubs, or gallops,no edema  Respiratory:  Clear to auscultation bilaterally, normal work of breathing GI: soft, nontender, nondistended, + BS MS: no deformity or atrophy1+ to 2+ pitting edema. Skin: warm and dry, no rash Neuro:  Strength and sensation are intact Psych: euthymic mood, full affect  Recent Labs: 05/12/2015: ALT 62; TSH 7.591* 05/30/2015: BUN 13; Creatinine, Ser 0.71; Magnesium 1.2*; Potassium 4.1; Sodium 127* 06/22/2015: Hemoglobin 11.5*; Platelets 226    Lipid Panel    Component Value Date/Time   CHOL 132 10/12/2014 0849   TRIG 108 10/12/2014 0849   HDL 42 10/12/2014 0849   CHOLHDL 3.1 10/12/2014 0849   VLDL 22 10/12/2014 0849   LDLCALC 68 10/12/2014 0849      Wt Readings from Last 3 Encounters:  06/22/15 212 lb 11.2 oz (96.48 kg)  06/12/15 210 lb 6.4 oz (95.437 kg)  06/09/15 214 lb (97.07 kg)      ASSESSMENT AND PLAN:  1.Chronic Diastolic CHF: The patient is frustrated with his need to restrict salt intake diuretics.  He states, that he has been working on this for months and cannot seem to keep the fluids down.  He would like to stop seeing cardiology for a while and call us when he needs [place].  He does not wish to restrict his salt intake.  He will continue his medications.    Current medicines are reviewed at length with the patient today.  Have advised the patient to continue with cardiology as we have not given up on him on her willing to continue to work with him to manage his fluid.  He states he does not wish to continue any longer.  He will continue with his primary care physician, Dr. Buelah Manis only.  2. Hypertension: he is slightly hypotensive today.  It would be my recommendation that he decrease his benazepril 20 mg daily.  He does not wish to followup for ongoing evaluation.  When he sees his primary care physician, I would like for her to consider doing this.  3. Chronic lower extremity edema: I advised that he use TED hose, but he states that [place], not to proceed with this at this time, and he does not wish to continue to CS therefore, he will follow up with primary care, and we will make those recommendations.  Labs/ tests ordered today include:None No orders of the defined types were placed in this encounter.     Disposition:   FU with PRN  Signed, Jory Sims, NP  06/26/2015 7:26 AM    [facility] [address] [place], [place] [address] Phone: [phone]; Fax: [phone]

## 2015-06-26 NOTE — Progress Notes (Signed)
Name: Michael [name]    DOB: Sep 04, 1946  Age: 69 y.o.  MR#: [id number]       PCP:  Vic Blackbird, MD      Insurance: Payor: [employer] MEDICARE / Plan: [facility] MEDICARE / Product Type: *No Product type* /   CC:    Chief Complaint  Patient presents with  . Coronary Artery Disease  . Hypertension    VS Filed Vitals:   06/26/15 1506  BP: 98/52  Pulse: 77  Height: 4' 11"  (1.499 m)  Weight: 214 lb (97.07 kg)  SpO2: 95%    Weights Current Weight  06/26/15 214 lb (97.07 kg)  06/22/15 212 lb 11.2 oz (96.48 kg)  06/12/15 210 lb 6.4 oz (95.437 kg)    Blood Pressure  BP Readings from Last 3 Encounters:  06/26/15 98/52  06/22/15 117/63  06/12/15 120/70     Admit date:  (Not on file) Last encounter with RMR:  05/23/2015   Allergy Review of patient's allergies indicates no known allergies.  Current Outpatient Prescriptions  Medication Sig Dispense Refill  . ACCU-CHEK AVIVA PLUS test strip     . aspirin EC 81 MG tablet Take 81 mg by mouth daily.    . B-D INS SYRINGE 0.5CC/31GX5/16 31G X 5/16" 0.5 ML MISC     . benazepril (LOTENSIN) 40 MG tablet TAKE ONE TABLET BY MOUTH DAILY. 90 tablet 1  . bumetanide (BUMEX) 2 MG tablet Take 1 tablet (2 mg total) by mouth 2 (two) times daily. 60 tablet 1  . clobetasol cream (TEMOVATE) 0.05 % APPLY TO AFFECTED AREA 2 TIMES DAILY. 45 g 11  . diclofenac sodium (VOLTAREN) 1 % GEL Apply 4 g topically 4 (four) times daily. 5 Tube 5  . Ferrous Sulfate (IRON) 325 (65 FE) MG TABS Take 1 tablet by mouth daily.     Marland Kitchen gabapentin (NEURONTIN) 400 MG capsule TAKE ONE CAPSULE BY MOUTH THREE TIMES DAILY. 90 capsule 2  . glipiZIDE (GLUCOTROL) 10 MG tablet TAKE ONE TABLET BY MOUTH TWICE DAILY BEFORE MEALS. 180 tablet 1  . Insulin Glargine (LANTUS) 100 UNIT/ML Solostar Pen Inject 30 Units into the skin at bedtime. 15 mL 11  . levothyroxine (SYNTHROID, LEVOTHROID) 50 MCG tablet Take 1 tablet (50 mcg total) by mouth daily before breakfast. 30 tablet 1  . Magnesium  400 MG TABS Take 400 mg by mouth 2 (two) times daily. 60 tablet 6  . meloxicam (MOBIC) 15 MG tablet Take 15 mg by mouth daily.     . metFORMIN (GLUCOPHAGE) 1000 MG tablet TAKE ONE TABLET BY MOUTH TWICE DAILY WITH A MEAL. 60 tablet 6  . metolazone (ZAROXOLYN) 2.5 MG tablet TAKE 1 TAB WEEKLY AS NEEDED FOR SEVERE SWELLING/WEIGHT GAIN 90 tablet 3  . nebivolol (BYSTOLIC) 10 MG tablet Take 1 tablet (10 mg total) by mouth daily. 30 tablet 3  . nystatin cream (MYCOSTATIN) Apply 1 application topically 2 (two) times daily. 45 g 1  . omeprazole (PRILOSEC) 20 MG capsule TAKE ONE CAPSULE BY MOUTH TWICE DAILY. 60 capsule 11  . oxyCODONE-acetaminophen (PERCOCET) 7.5-325 MG per tablet Take 1 tablet by mouth every 6 (six) hours as needed for severe pain. 90 tablet 0  . potassium chloride (K-DUR) 10 MEQ tablet Take 2 tablets in the AM / Take 1 tablet in the PM 90 tablet 3  . pravastatin (PRAVACHOL) 40 MG tablet Take 1 tablet (40 mg total) by mouth every evening. 90 tablet 1   No current facility-administered medications for this  visit.    Discontinued Meds:   There are no discontinued medications.  Patient Active Problem List   Diagnosis Date Noted  . Diastolic CHF 09/60/4540  . Diastolic dysfunction 98/07/9146  . Venous stasis dermatitis 04/20/2015  . Stage II pressure ulcer of sacral region 02/13/2015  . Atypical nevi 01/18/2015  . Intertriginous candidiasis 12/23/2014  . Hypothyroidism 11/30/2014  . Bradycardia 07/21/2014  . Sinus pause 07/21/2014  . Liver fibrosis 06/29/2014  . IDA (iron deficiency anemia) 06/29/2014  . Iron deficiency anemia 05/31/2014  . Hepatomegaly 05/25/2014  . Difficulty in walking(719.7) 05/03/2014  . Knee pain, chronic 05/03/2014  . Stiffness of joint, not elsewhere classified, pelvic region and thigh 05/03/2014  . Dermatitis 04/12/2014  . HNP (herniated nucleus pulposus), lumbar 08/30/2013  . Spinal stenosis of lumbar region 07/06/2013  . Carpal tunnel syndrome  04/13/2013  . Peripheral edema 04/13/2013  . Status post arthroscopy of left knee 04/13/2013  . DDD (degenerative disc disease), lumbosacral 04/13/2013  . Peripheral neuropathy 01/05/2013  . S/P arthroscopy of left knee 11/09/2012  . Osteoarthritis of left knee 10/27/2012  . Meniscus tear 10/07/2012  . Patellar tendonitis 07/01/2012  . NASH (nonalcoholic steatohepatitis) 05/22/2007  . Type II diabetes mellitus with neurological manifestations 05/20/2007  . Hyperlipidemia 10/15/2006  . OBESITY, MORBID 10/15/2006  . Essential hypertension 10/15/2006  . GERD 10/15/2006    LABS    Component Value Date/Time   NA 127* 05/30/2015 1030   NA 124* 05/22/2015 0935   NA 132* 05/12/2015 1355   K 4.1 05/30/2015 1030   K 4.3 05/22/2015 0935   K 4.5 05/12/2015 1355   CL 92* 05/30/2015 1030   CL 87* 05/22/2015 0935   CL 97* 05/12/2015 1355   CO2 25 05/30/2015 1030   CO2 22 05/22/2015 0935   CO2 26 05/12/2015 1355   GLUCOSE 198* 05/30/2015 1030   GLUCOSE 97 05/22/2015 0935   GLUCOSE 110* 05/12/2015 1355   BUN 13 05/30/2015 1030   BUN 10 05/22/2015 0935   BUN 9 05/12/2015 1355   CREATININE 0.71 05/30/2015 1030   CREATININE 0.60* 05/22/2015 0935   CREATININE 0.63 05/12/2015 1355   CREATININE 0.63 05/02/2015 1330   CREATININE 0.70 03/01/2015 1245   CREATININE 0.59 01/18/2015 0847   CALCIUM 8.6* 05/30/2015 1030   CALCIUM 9.3 05/22/2015 0935   CALCIUM 9.0 05/12/2015 1355   GFRNONAA >60 05/30/2015 1030   GFRNONAA >60 05/12/2015 1355   GFRNONAA >60 05/02/2015 1330   GFRAA >60 05/30/2015 1030   GFRAA >60 05/12/2015 1355   GFRAA >60 05/02/2015 1330   CMP     Component Value Date/Time   NA 127* 05/30/2015 1030   K 4.1 05/30/2015 1030   CL 92* 05/30/2015 1030   CO2 25 05/30/2015 1030   GLUCOSE 198* 05/30/2015 1030   BUN 13 05/30/2015 1030   CREATININE 0.71 05/30/2015 1030   CREATININE 0.60* 05/22/2015 0935   CALCIUM 8.6* 05/30/2015 1030   PROT 6.6 05/12/2015 1355   ALBUMIN 4.0  05/12/2015 1355   AST 54* 05/12/2015 1355   ALT 62 05/12/2015 1355   ALKPHOS 66 05/12/2015 1355   BILITOT 0.3 05/12/2015 1355   GFRNONAA >60 05/30/2015 1030   GFRAA >60 05/30/2015 1030       Component Value Date/Time   WBC 4.8 06/22/2015 0951   WBC 6.6 05/18/2015 1218   WBC 6.5 05/12/2015 1355   HGB 11.5* 06/22/2015 0951   HGB 11.4* 05/18/2015 1218   HGB 10.3* 05/12/2015 1355  HCT 34.6* 06/22/2015 0951   HCT 36.0* 05/18/2015 1218   HCT 33.4* 05/12/2015 1355   MCV 78.5 06/22/2015 0951   MCV 75.2* 05/18/2015 1218   MCV 74.6* 05/12/2015 1355    Lipid Panel     Component Value Date/Time   CHOL 132 10/12/2014 0849   TRIG 108 10/12/2014 0849   HDL 42 10/12/2014 0849   CHOLHDL 3.1 10/12/2014 0849   VLDL 22 10/12/2014 0849   LDLCALC 68 10/12/2014 0849    ABG No results found for: PHART, PCO2ART, PO2ART, HCO3, TCO2, ACIDBASEDEF, O2SAT   Lab Results  Component Value Date   TSH 7.591* 05/12/2015   BNP (last 3 results) No results for input(s): BNP in the last 8760 hours.  ProBNP (last 3 results) No results for input(s): PROBNP in the last 8760 hours.  Cardiac Panel (last 3 results) No results for input(s): CKTOTAL, CKMB, TROPONINI, RELINDX in the last 72 hours.  Iron/TIBC/Ferritin/ %Sat    Component Value Date/Time   IRON 20* 05/02/2015 1331   TIBC 434 05/02/2015 1331   FERRITIN 107 06/22/2015 0951   IRONPCTSAT 5* 05/02/2015 1331   IRONPCTSAT 6* 04/13/2013 0846     EKG Orders placed or performed in visit on 04/04/15  . EKG 12-Lead  . EKG 12-Lead     Prior Assessment and Plan Problem List as of 06/26/2015      Cardiovascular and Mediastinum   Essential hypertension   Last Assessment & Plan 05/22/2015 Office Visit Written 05/22/2015  9:37 AM by [name], MD    Blood pressure improved, with diuresis      Sinus pause   Diastolic CHF     Digestive   GERD   NASH (nonalcoholic steatohepatitis)   Last Assessment & Plan 10/12/2014 Office Visit Written  10/12/2014  1:35 PM by [name], MD    Recheck liver function as well as cholesterol level from recent hospitalization      Hepatomegaly   Last Assessment & Plan 05/25/2014 Office Visit Written 05/31/2014  5:12 PM by Orvil Feil, NP    On exam. Known fatty liver. Proceed with elastography.       Liver fibrosis   Last Assessment & Plan 06/29/2014 Office Visit Written 06/29/2014  2:31 PM by Orvil Feil, NP    elastography ordered due to hepatomegaly. Metavir score of F4. Obtain viral markers. LFTs normal. Likely secondary to NASH. EGD already planned due to IDA. Needs [place] every 6 months and hepatitis vaccinations. Prescription provided.         Endocrine   Type II diabetes mellitus with neurological manifestations   Last Assessment & Plan 04/24/2015 Office Visit Written 04/24/2015  1:57 PM by [name], MD    A1c in office today 6.7% showing very good control of his diabetes to change her current regimen      Hypothyroidism   Last Assessment & Plan 05/22/2015 Office Visit Written 05/22/2015  9:37 AM by [name], MD    TFT abnormal past 2 weeks, however with anemia and signficant edema causing stress on body, I will wait about 8 weeks and recheck these levels, no change to medication dose        Nervous and Auditory   Peripheral neuropathy   Last Assessment & Plan 10/12/2014 Office Visit Written 10/12/2014  1:36 PM by [name], MD    Continue gabapentin he will also make a follow-up with podiatry for nail trimming and foot care  Carpal tunnel syndrome   Last Assessment & Plan 04/13/2013 Office Visit Written 04/13/2013  9:29 AM by [name], MD    Diagnoses given by neurology he'll be referred to a new neurologist for second opinion and conduction studies        Musculoskeletal and Integument   Patellar tendonitis   Meniscus tear   Last Assessment & Plan 02/08/2014 Office Visit Written 02/08/2014  4:47 PM by [name], MD    Status post  arthroscopic  will forward previous notes      Osteoarthritis of left knee   Last Assessment & Plan 02/13/2015 Office Visit Written 02/13/2015  5:18 PM by [name], MD    Change pain meds to perococet 7.81m TID #90 given      DDD (degenerative disc disease), lumbosacral   HNP (herniated nucleus pulposus), lumbar   Dermatitis   Last Assessment & Plan 04/12/2014 Office Visit Written 04/12/2014  5:22 PM by KAlycia Rossetti MD    I will give him clobetasol to apply to the lesions in his scalp after washing possible this is psoriasis related he does not have any significant plaques therefore difficult to tell.      Atypical nevi   Last Assessment & Plan 01/18/2015 Office Visit Written 01/18/2015  8:50 AM by KAlycia Rossetti MD    Mole on face enlarging in size, needs removal Also has lesion on lower eyelid that needs evaluation Referral to dermatology      Stage II pressure ulcer of sacral region   Last Assessment & Plan 06/09/2015 Office Visit Written 06/09/2015  5:20 PM by KAlycia Rossetti MD    Duoderm applied to the ulcer here in the office we did not have any hydrocolloid dressing. We'll get him set up for home health again. Was important to have him not sitting in one position since he has had his knee problems and his leg swelling he is very immobile continues to get breakdown in the saying spot. He has a hospital bed also advised him to alternate pillows to take the pressure off of the sacral region.      Venous stasis dermatitis   Last Assessment & Plan 04/24/2015 Office Visit Written 04/24/2015  1:56 PM by KAlycia Rossetti MD    Clobetasol topical twice a day        Other   Hyperlipidemia   Last Assessment & Plan 01/18/2015 Office Visit Written 01/18/2015  8:48 AM by KAlycia Rossetti MD    cholesteorl at goal, continue statin drug      OBESITY, MORBID   Last Assessment & Plan 01/05/2013 Office Visit Written 01/05/2013  8:53 AM by KAlycia Rossetti MD    He is being more  active with watching his diet, decreased mobility      S/P arthroscopy of left knee   Peripheral edema   Last Assessment & Plan 06/09/2015 Office Visit Written 06/09/2015  5:20 PM by KAlycia Rossetti MD     Significant edema noted today his weight is also up again. advised to take another tablet of Zaroxolyn      Status post arthroscopy of left knee   Spinal stenosis of lumbar region   Last Assessment & Plan 07/14/2013 Office Visit Written 07/14/2013  5:46 PM by KAlycia Rossetti MD    Reviewed MRI and orthopedic note. We'll await neurosurgery appointment. We'll continue him on the gabapentin      Difficulty in walking(719.7)   Knee  pain, chronic   Stiffness of joint, not elsewhere classified, pelvic region and thigh   Iron deficiency anemia   Last Assessment & Plan 05/22/2015 Office Visit Written 05/22/2015  9:36 AM by [name], MD    Hb improved, above baseline, cleared for surgery       IDA (iron deficiency anemia)   Last Assessment & Plan 06/29/2014 Office Visit Edited 06/29/2014  2:30 PM by Orvil Feil, NP    69 year old male with IDA, prior work-up in 2011 at [facility] with negative EGD/enteroscopy and colonoscopy. Patient reports capsule study done at this time but no records available. No overt signs of GI bleeding or concerning GI symptoms. On Mobic AND naprosyn, which could cause insult anywhere in the GI tract. Mother with history of rectal cancer. Prior history of failed sedation.  Proceed with TCS/EGD with Dr. Gala Romney in near future: the risks, benefits, and alternatives have been discussed with the patient in detail. The patient states understanding and desires to proceed. PROPOFOL for sedation Limit naprosyn and Mobic May ultimately need capsule study Increase iron to BID      Bradycardia   Intertriginous candidiasis   Diastolic dysfunction       Imaging: No results found.

## 2015-06-27 DIAGNOSIS — R609 Edema, unspecified: Secondary | ICD-10-CM | POA: Diagnosis not present

## 2015-06-27 DIAGNOSIS — K7581 Nonalcoholic steatohepatitis (NASH): Secondary | ICD-10-CM | POA: Diagnosis not present

## 2015-06-27 DIAGNOSIS — Z794 Long term (current) use of insulin: Secondary | ICD-10-CM | POA: Diagnosis not present

## 2015-06-27 DIAGNOSIS — D649 Anemia, unspecified: Secondary | ICD-10-CM | POA: Diagnosis not present

## 2015-06-27 DIAGNOSIS — I10 Essential (primary) hypertension: Secondary | ICD-10-CM | POA: Diagnosis not present

## 2015-06-27 DIAGNOSIS — L89152 Pressure ulcer of sacral region, stage 2: Secondary | ICD-10-CM | POA: Diagnosis not present

## 2015-06-27 DIAGNOSIS — I5030 Unspecified diastolic (congestive) heart failure: Secondary | ICD-10-CM | POA: Diagnosis not present

## 2015-06-27 DIAGNOSIS — Z4800 Encounter for change or removal of nonsurgical wound dressing: Secondary | ICD-10-CM | POA: Diagnosis not present

## 2015-06-27 DIAGNOSIS — E1140 Type 2 diabetes mellitus with diabetic neuropathy, unspecified: Secondary | ICD-10-CM | POA: Diagnosis not present

## 2015-06-27 DIAGNOSIS — E1149 Type 2 diabetes mellitus with other diabetic neurological complication: Secondary | ICD-10-CM | POA: Diagnosis not present

## 2015-06-27 NOTE — Telephone Encounter (Signed)
appt for oct 10  With any mediacl reports i need to prepare for surgery

## 2015-06-27 NOTE — Telephone Encounter (Signed)
Please advise and schedule patient, thank you

## 2015-06-30 DIAGNOSIS — Z4800 Encounter for change or removal of nonsurgical wound dressing: Secondary | ICD-10-CM | POA: Diagnosis not present

## 2015-06-30 DIAGNOSIS — I5030 Unspecified diastolic (congestive) heart failure: Secondary | ICD-10-CM | POA: Diagnosis not present

## 2015-06-30 DIAGNOSIS — E1149 Type 2 diabetes mellitus with other diabetic neurological complication: Secondary | ICD-10-CM | POA: Diagnosis not present

## 2015-06-30 DIAGNOSIS — I10 Essential (primary) hypertension: Secondary | ICD-10-CM | POA: Diagnosis not present

## 2015-06-30 DIAGNOSIS — Z794 Long term (current) use of insulin: Secondary | ICD-10-CM | POA: Diagnosis not present

## 2015-06-30 DIAGNOSIS — E1140 Type 2 diabetes mellitus with diabetic neuropathy, unspecified: Secondary | ICD-10-CM | POA: Diagnosis not present

## 2015-06-30 DIAGNOSIS — L89152 Pressure ulcer of sacral region, stage 2: Secondary | ICD-10-CM | POA: Diagnosis not present

## 2015-06-30 DIAGNOSIS — R609 Edema, unspecified: Secondary | ICD-10-CM | POA: Diagnosis not present

## 2015-06-30 DIAGNOSIS — D649 Anemia, unspecified: Secondary | ICD-10-CM | POA: Diagnosis not present

## 2015-06-30 DIAGNOSIS — K7581 Nonalcoholic steatohepatitis (NASH): Secondary | ICD-10-CM | POA: Diagnosis not present

## 2015-07-03 DIAGNOSIS — Z4800 Encounter for change or removal of nonsurgical wound dressing: Secondary | ICD-10-CM | POA: Diagnosis not present

## 2015-07-03 DIAGNOSIS — I10 Essential (primary) hypertension: Secondary | ICD-10-CM | POA: Diagnosis not present

## 2015-07-03 DIAGNOSIS — L89152 Pressure ulcer of sacral region, stage 2: Secondary | ICD-10-CM | POA: Diagnosis not present

## 2015-07-03 DIAGNOSIS — K7581 Nonalcoholic steatohepatitis (NASH): Secondary | ICD-10-CM | POA: Diagnosis not present

## 2015-07-03 DIAGNOSIS — R609 Edema, unspecified: Secondary | ICD-10-CM | POA: Diagnosis not present

## 2015-07-03 DIAGNOSIS — D649 Anemia, unspecified: Secondary | ICD-10-CM | POA: Diagnosis not present

## 2015-07-03 DIAGNOSIS — E1149 Type 2 diabetes mellitus with other diabetic neurological complication: Secondary | ICD-10-CM | POA: Diagnosis not present

## 2015-07-03 DIAGNOSIS — I5030 Unspecified diastolic (congestive) heart failure: Secondary | ICD-10-CM | POA: Diagnosis not present

## 2015-07-03 DIAGNOSIS — E1140 Type 2 diabetes mellitus with diabetic neuropathy, unspecified: Secondary | ICD-10-CM | POA: Diagnosis not present

## 2015-07-03 DIAGNOSIS — Z794 Long term (current) use of insulin: Secondary | ICD-10-CM | POA: Diagnosis not present

## 2015-07-04 NOTE — Telephone Encounter (Signed)
Called patient - appointment is scheduled accordingly (left voice mail reminder)

## 2015-07-07 DIAGNOSIS — E1140 Type 2 diabetes mellitus with diabetic neuropathy, unspecified: Secondary | ICD-10-CM | POA: Diagnosis not present

## 2015-07-07 DIAGNOSIS — I5030 Unspecified diastolic (congestive) heart failure: Secondary | ICD-10-CM | POA: Diagnosis not present

## 2015-07-07 DIAGNOSIS — R609 Edema, unspecified: Secondary | ICD-10-CM | POA: Diagnosis not present

## 2015-07-07 DIAGNOSIS — E1149 Type 2 diabetes mellitus with other diabetic neurological complication: Secondary | ICD-10-CM | POA: Diagnosis not present

## 2015-07-07 DIAGNOSIS — K7581 Nonalcoholic steatohepatitis (NASH): Secondary | ICD-10-CM | POA: Diagnosis not present

## 2015-07-07 DIAGNOSIS — L89152 Pressure ulcer of sacral region, stage 2: Secondary | ICD-10-CM | POA: Diagnosis not present

## 2015-07-07 DIAGNOSIS — L89312 Pressure ulcer of right buttock, stage 2: Secondary | ICD-10-CM | POA: Diagnosis not present

## 2015-07-07 DIAGNOSIS — I10 Essential (primary) hypertension: Secondary | ICD-10-CM | POA: Diagnosis not present

## 2015-07-07 DIAGNOSIS — Z4800 Encounter for change or removal of nonsurgical wound dressing: Secondary | ICD-10-CM | POA: Diagnosis not present

## 2015-07-07 DIAGNOSIS — D649 Anemia, unspecified: Secondary | ICD-10-CM | POA: Diagnosis not present

## 2015-07-07 DIAGNOSIS — Z794 Long term (current) use of insulin: Secondary | ICD-10-CM | POA: Diagnosis not present

## 2015-07-10 ENCOUNTER — Ambulatory Visit (INDEPENDENT_AMBULATORY_CARE_PROVIDER_SITE_OTHER): Payer: Medicare Other | Admitting: Orthopedic Surgery

## 2015-07-10 ENCOUNTER — Other Ambulatory Visit: Payer: Self-pay | Admitting: *Deleted

## 2015-07-10 ENCOUNTER — Telehealth: Payer: Self-pay | Admitting: *Deleted

## 2015-07-10 VITALS — BP 164/91 | Ht 59.0 in | Wt 214.0 lb

## 2015-07-10 DIAGNOSIS — M7652 Patellar tendinitis, left knee: Secondary | ICD-10-CM

## 2015-07-10 DIAGNOSIS — I878 Other specified disorders of veins: Secondary | ICD-10-CM

## 2015-07-10 DIAGNOSIS — M4806 Spinal stenosis, lumbar region: Secondary | ICD-10-CM

## 2015-07-10 DIAGNOSIS — M5137 Other intervertebral disc degeneration, lumbosacral region: Secondary | ICD-10-CM

## 2015-07-10 DIAGNOSIS — M1712 Unilateral primary osteoarthritis, left knee: Secondary | ICD-10-CM | POA: Diagnosis not present

## 2015-07-10 DIAGNOSIS — D638 Anemia in other chronic diseases classified elsewhere: Secondary | ICD-10-CM

## 2015-07-10 DIAGNOSIS — M48061 Spinal stenosis, lumbar region without neurogenic claudication: Secondary | ICD-10-CM

## 2015-07-10 DIAGNOSIS — Z9889 Other specified postprocedural states: Secondary | ICD-10-CM | POA: Diagnosis not present

## 2015-07-10 DIAGNOSIS — R609 Edema, unspecified: Secondary | ICD-10-CM

## 2015-07-10 NOTE — Patient Instructions (Signed)
We will refer you for a vascular consult for chronic venous stasis  We will schedule MRI for you and call you with appt and results

## 2015-07-10 NOTE — Telephone Encounter (Signed)
REFERRAL FAXED TO [facility] [place]

## 2015-07-10 NOTE — Progress Notes (Signed)
FOLLOW UP :   Chief Complaint  Patient presents with  . Follow-up    follow up bilateral knee pain, discuss Left TKA    Michael [name] has multiple problems in addition to his left knee arthritis  Encounter Diagnoses  Name Primary?  . Primary osteoarthritis of left knee Yes  . Spinal stenosis of lumbar region   . Status post arthroscopy of left knee   . DDD (degenerative disc disease), lumbosacral   . Patellar tendonitis, left   . Anemia, chronic disease   . Peripheral edema     He did correct his anemia. However he is walking with such a flexed posture that he is not ready for knee replacement he needs to have his back checked so we will do a new MRI  His peripheral edema is also out of control and make some highly susceptible for blood clot and in that regard is not ready for surgery either  I reviewed his most recent x-ray and is arthritis shows moderate disease but not severe and as I told them before I think primarily his problem is in his back  In 2014 Dr. Luiz Ochoa and Dr. Ethelene Hal did a laminectomy on him and he has worsened in the last year I would say.  Review of systems he does not complain of back pain but I suspect because he walks with such lumbo-sacral flexion that he has relieved his symptoms.  He cannot have knee replacement surgery though until his vascular status is evaluated and treated and his lumbar spine condition is treated.  So at this point we will repeat his MRI I will set up a vascular consult I will see him after the vascular consult  Time spent 17 minutes

## 2015-07-11 DIAGNOSIS — E1140 Type 2 diabetes mellitus with diabetic neuropathy, unspecified: Secondary | ICD-10-CM | POA: Diagnosis not present

## 2015-07-11 DIAGNOSIS — L89152 Pressure ulcer of sacral region, stage 2: Secondary | ICD-10-CM | POA: Diagnosis not present

## 2015-07-11 DIAGNOSIS — E1149 Type 2 diabetes mellitus with other diabetic neurological complication: Secondary | ICD-10-CM | POA: Diagnosis not present

## 2015-07-11 DIAGNOSIS — R609 Edema, unspecified: Secondary | ICD-10-CM | POA: Diagnosis not present

## 2015-07-11 DIAGNOSIS — D649 Anemia, unspecified: Secondary | ICD-10-CM | POA: Diagnosis not present

## 2015-07-11 DIAGNOSIS — K7581 Nonalcoholic steatohepatitis (NASH): Secondary | ICD-10-CM | POA: Diagnosis not present

## 2015-07-11 DIAGNOSIS — Z794 Long term (current) use of insulin: Secondary | ICD-10-CM | POA: Diagnosis not present

## 2015-07-11 DIAGNOSIS — Z4800 Encounter for change or removal of nonsurgical wound dressing: Secondary | ICD-10-CM | POA: Diagnosis not present

## 2015-07-11 DIAGNOSIS — I10 Essential (primary) hypertension: Secondary | ICD-10-CM | POA: Diagnosis not present

## 2015-07-11 DIAGNOSIS — I5030 Unspecified diastolic (congestive) heart failure: Secondary | ICD-10-CM | POA: Diagnosis not present

## 2015-07-13 ENCOUNTER — Other Ambulatory Visit: Payer: Self-pay | Admitting: *Deleted

## 2015-07-13 MED ORDER — DIAZEPAM 5 MG PO TABS: ORAL_TABLET | ORAL | Status: DC

## 2015-07-17 ENCOUNTER — Telehealth: Payer: Self-pay | Admitting: Family Medicine

## 2015-07-17 DIAGNOSIS — Z4800 Encounter for change or removal of nonsurgical wound dressing: Secondary | ICD-10-CM | POA: Diagnosis not present

## 2015-07-17 DIAGNOSIS — K7581 Nonalcoholic steatohepatitis (NASH): Secondary | ICD-10-CM | POA: Diagnosis not present

## 2015-07-17 DIAGNOSIS — E1140 Type 2 diabetes mellitus with diabetic neuropathy, unspecified: Secondary | ICD-10-CM | POA: Diagnosis not present

## 2015-07-17 DIAGNOSIS — I5030 Unspecified diastolic (congestive) heart failure: Secondary | ICD-10-CM | POA: Diagnosis not present

## 2015-07-17 DIAGNOSIS — R609 Edema, unspecified: Secondary | ICD-10-CM | POA: Diagnosis not present

## 2015-07-17 DIAGNOSIS — D649 Anemia, unspecified: Secondary | ICD-10-CM | POA: Diagnosis not present

## 2015-07-17 DIAGNOSIS — Z794 Long term (current) use of insulin: Secondary | ICD-10-CM | POA: Diagnosis not present

## 2015-07-17 DIAGNOSIS — E1149 Type 2 diabetes mellitus with other diabetic neurological complication: Secondary | ICD-10-CM | POA: Diagnosis not present

## 2015-07-17 DIAGNOSIS — I10 Essential (primary) hypertension: Secondary | ICD-10-CM | POA: Diagnosis not present

## 2015-07-17 DIAGNOSIS — L89152 Pressure ulcer of sacral region, stage 2: Secondary | ICD-10-CM | POA: Diagnosis not present

## 2015-07-17 MED ORDER — OXYCODONE-ACETAMINOPHEN 7.5-325 MG PO TABS: 1.0000 | ORAL_TABLET | Freq: Four times a day (QID) | ORAL | Status: DC | PRN

## 2015-07-17 NOTE — Telephone Encounter (Signed)
Prescription printed and patient made aware to come to office to pick up after 2pm on 07/17/2015.

## 2015-07-17 NOTE — Telephone Encounter (Signed)
Ok to refill??  Last office visit 06/09/2015.  Last refill 06/19/2015.

## 2015-07-17 NOTE — Telephone Encounter (Signed)
Patient asking for rx for his hydrocodone, he is out of this

## 2015-07-17 NOTE — Telephone Encounter (Signed)
Okay to refill? 

## 2015-07-21 ENCOUNTER — Encounter: Payer: Self-pay | Admitting: Vascular Surgery

## 2015-07-24 ENCOUNTER — Other Ambulatory Visit: Payer: Self-pay | Admitting: *Deleted

## 2015-07-24 ENCOUNTER — Encounter: Payer: Self-pay | Admitting: *Deleted

## 2015-07-24 ENCOUNTER — Ambulatory Visit (INDEPENDENT_AMBULATORY_CARE_PROVIDER_SITE_OTHER): Payer: Medicare Other | Admitting: Family Medicine

## 2015-07-24 ENCOUNTER — Encounter: Payer: Self-pay | Admitting: Family Medicine

## 2015-07-24 VITALS — BP 134/78 | HR 82 | Temp 98.2°F | Resp 16 | Ht 59.0 in | Wt 215.0 lb

## 2015-07-24 DIAGNOSIS — E1149 Type 2 diabetes mellitus with other diabetic neurological complication: Secondary | ICD-10-CM | POA: Diagnosis not present

## 2015-07-24 DIAGNOSIS — R609 Edema, unspecified: Secondary | ICD-10-CM | POA: Diagnosis not present

## 2015-07-24 DIAGNOSIS — I872 Venous insufficiency (chronic) (peripheral): Secondary | ICD-10-CM

## 2015-07-24 DIAGNOSIS — R262 Difficulty in walking, not elsewhere classified: Secondary | ICD-10-CM | POA: Diagnosis not present

## 2015-07-24 DIAGNOSIS — I10 Essential (primary) hypertension: Secondary | ICD-10-CM | POA: Diagnosis not present

## 2015-07-24 NOTE — Patient Instructions (Addendum)
Release of records- [place] set of labs and PCP note  Physical therapy to be done I will follow up the MRI and the vascular surgeon Watch the sodium in your diet TED hose  F/U 3 months

## 2015-07-24 NOTE — Assessment & Plan Note (Signed)
peristant edema, known diastolic HF, he is complaint with meds. Will get TED HOSE  Will f/u with vascular surgery, discussed with pt that medication management and trying to increase mobility may be only thing that can be done

## 2015-07-24 NOTE — Assessment & Plan Note (Signed)
Obtain labs from [place], no change to meds at this time, no hypoglycemia symptoms

## 2015-07-24 NOTE — Assessment & Plan Note (Addendum)
Home PT    addendum to visit. [name] is having increased gait instability and difficulty walking. He was evaluated by vascular surgery he has primary lymphedema. He is unable to get around with the use of his [place] Rile because of lower extremity weakness and upper extremity weakness. He cannot sufficiently use a cane or crutches either. A wheelchair will allow him to safely propel himself around his home and he also has a caregiver [place] system with the well care when he has to come to doctors visits.

## 2015-07-24 NOTE — Progress Notes (Signed)
Patient ID: OSKER AYOUB, male   DOB: August 06, 1946, 69 y.o.   MRN: [id number]   Subjective:    Patient ID: MIHRAN LEBARRON, male    DOB: 09/19/1946, 69 y.o.   MRN: [id number]  Patient presents for 2 month F/U  patient here for interim follow-up. He was sent to cardiology secondary to persistent lower extremity edema. He has chronic diastolic heart failure. His diuretics were changed and he would drop weight as well as edema however it returned. He is now on Bumex and uses Zaroxolyn only as needed for severe edema. He is also trying to monitor his diet. I reviewed the last cardiology note seems that there was some frustration he was concerned about paying the co-pay to go to the cardiologist. He was seen by orthopedic surgeon for his chronic knee pain we've been trying to solve the peripheral edema issue before he can have knee surgery. He is referred to have an MRI of his back because of his worsening posture and he was also referred to vascular surgery for evaluation.   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 He is still with his current home health nurse who is working on the sacral decubitus ulcer that continues to reoccur from sitting so much. They also request physical therapy to help with his gait and balance.   He had fasting labs done at the [facility] do not have copies of these today he was told that his blood sugar looked good. He states that his blood sugars typically runs from 80 to low 100s fasting he is taking Lantus 30 units and his metformin.    Review Of Systems:  GEN- denies fatigue, fever, weight loss,weakness, recent illness HEENT- denies eye drainage, change in vision, nasal discharge, CVS- denies chest pain, palpitations RESP- denies SOB, cough, wheeze ABD- denies N/V, change in stools, abd  pain GU- denies dysuria, hematuria, dribbling, incontinence MSK- +joint pain, muscle aches, injury Neuro- denies headache, dizziness, syncope, seizure activity       Objective:    BP 134/78 mmHg  Pulse 82  Temp(Src) 98.2 F (36.8 C) (Oral)  Resp 16  Ht 4' 11"  (1.499 m)  Wt 215 lb (97.523 kg)  BMI 43.40 kg/m2 GEN- NAD, alert and oriented x3,walks with walker HEENT-PERRL, EOMI non icteric pink conjunctiva  CVS- RRR, no murmur RESP-CTAB EXT- 1+  Pitting edema Skin- stage 1  sacral decub -right buttocks, mild erythema of gluteal cleft  Pulses- Radial, DP- 1+        Assessment & Plan:      Problem List Items Addressed This Visit    Type II diabetes mellitus with neurological manifestations ([place]) - Primary   Peripheral edema   Essential hypertension      Note: This dictation was prepared with Dragon dictation along with smaller phrase technology. Any transcriptional errors that result from this process are unintentional.

## 2015-07-24 NOTE — Assessment & Plan Note (Signed)
Continue current meds, BP seems to be fluctuating between offices

## 2015-07-25 DIAGNOSIS — E1149 Type 2 diabetes mellitus with other diabetic neurological complication: Secondary | ICD-10-CM | POA: Diagnosis not present

## 2015-07-25 DIAGNOSIS — E1151 Type 2 diabetes mellitus with diabetic peripheral angiopathy without gangrene: Secondary | ICD-10-CM | POA: Diagnosis not present

## 2015-07-25 DIAGNOSIS — Z4800 Encounter for change or removal of nonsurgical wound dressing: Secondary | ICD-10-CM | POA: Diagnosis not present

## 2015-07-25 DIAGNOSIS — I10 Essential (primary) hypertension: Secondary | ICD-10-CM | POA: Diagnosis not present

## 2015-07-25 DIAGNOSIS — E1140 Type 2 diabetes mellitus with diabetic neuropathy, unspecified: Secondary | ICD-10-CM | POA: Diagnosis not present

## 2015-07-25 DIAGNOSIS — R609 Edema, unspecified: Secondary | ICD-10-CM | POA: Diagnosis not present

## 2015-07-25 DIAGNOSIS — Z794 Long term (current) use of insulin: Secondary | ICD-10-CM | POA: Diagnosis not present

## 2015-07-25 DIAGNOSIS — D649 Anemia, unspecified: Secondary | ICD-10-CM | POA: Diagnosis not present

## 2015-07-25 DIAGNOSIS — K7581 Nonalcoholic steatohepatitis (NASH): Secondary | ICD-10-CM | POA: Diagnosis not present

## 2015-07-25 DIAGNOSIS — L89152 Pressure ulcer of sacral region, stage 2: Secondary | ICD-10-CM | POA: Diagnosis not present

## 2015-07-25 DIAGNOSIS — I5030 Unspecified diastolic (congestive) heart failure: Secondary | ICD-10-CM | POA: Diagnosis not present

## 2015-07-25 DIAGNOSIS — L89312 Pressure ulcer of right buttock, stage 2: Secondary | ICD-10-CM | POA: Diagnosis not present

## 2015-07-26 DIAGNOSIS — K7581 Nonalcoholic steatohepatitis (NASH): Secondary | ICD-10-CM | POA: Diagnosis not present

## 2015-07-26 DIAGNOSIS — E1140 Type 2 diabetes mellitus with diabetic neuropathy, unspecified: Secondary | ICD-10-CM | POA: Diagnosis not present

## 2015-07-26 DIAGNOSIS — I5030 Unspecified diastolic (congestive) heart failure: Secondary | ICD-10-CM | POA: Diagnosis not present

## 2015-07-26 DIAGNOSIS — R809 Proteinuria, unspecified: Secondary | ICD-10-CM | POA: Diagnosis not present

## 2015-07-26 DIAGNOSIS — L89312 Pressure ulcer of right buttock, stage 2: Secondary | ICD-10-CM | POA: Diagnosis not present

## 2015-07-26 DIAGNOSIS — E1149 Type 2 diabetes mellitus with other diabetic neurological complication: Secondary | ICD-10-CM | POA: Diagnosis not present

## 2015-07-26 DIAGNOSIS — L89152 Pressure ulcer of sacral region, stage 2: Secondary | ICD-10-CM | POA: Diagnosis not present

## 2015-07-26 DIAGNOSIS — Z4800 Encounter for change or removal of nonsurgical wound dressing: Secondary | ICD-10-CM | POA: Diagnosis not present

## 2015-07-26 DIAGNOSIS — Z794 Long term (current) use of insulin: Secondary | ICD-10-CM | POA: Diagnosis not present

## 2015-07-26 DIAGNOSIS — R609 Edema, unspecified: Secondary | ICD-10-CM | POA: Diagnosis not present

## 2015-07-26 DIAGNOSIS — R269 Unspecified abnormalities of gait and mobility: Secondary | ICD-10-CM | POA: Diagnosis not present

## 2015-07-26 DIAGNOSIS — D649 Anemia, unspecified: Secondary | ICD-10-CM | POA: Diagnosis not present

## 2015-07-26 DIAGNOSIS — I10 Essential (primary) hypertension: Secondary | ICD-10-CM | POA: Diagnosis not present

## 2015-07-27 ENCOUNTER — Telehealth: Payer: Self-pay | Admitting: *Deleted

## 2015-07-27 DIAGNOSIS — D649 Anemia, unspecified: Secondary | ICD-10-CM | POA: Diagnosis not present

## 2015-07-27 DIAGNOSIS — L89152 Pressure ulcer of sacral region, stage 2: Secondary | ICD-10-CM | POA: Diagnosis not present

## 2015-07-27 DIAGNOSIS — I10 Essential (primary) hypertension: Secondary | ICD-10-CM | POA: Diagnosis not present

## 2015-07-27 DIAGNOSIS — I5030 Unspecified diastolic (congestive) heart failure: Secondary | ICD-10-CM | POA: Diagnosis not present

## 2015-07-27 DIAGNOSIS — R609 Edema, unspecified: Secondary | ICD-10-CM | POA: Diagnosis not present

## 2015-07-27 DIAGNOSIS — E1140 Type 2 diabetes mellitus with diabetic neuropathy, unspecified: Secondary | ICD-10-CM | POA: Diagnosis not present

## 2015-07-27 DIAGNOSIS — K7581 Nonalcoholic steatohepatitis (NASH): Secondary | ICD-10-CM | POA: Diagnosis not present

## 2015-07-27 DIAGNOSIS — Z4800 Encounter for change or removal of nonsurgical wound dressing: Secondary | ICD-10-CM | POA: Diagnosis not present

## 2015-07-27 DIAGNOSIS — E1149 Type 2 diabetes mellitus with other diabetic neurological complication: Secondary | ICD-10-CM | POA: Diagnosis not present

## 2015-07-27 DIAGNOSIS — Z794 Long term (current) use of insulin: Secondary | ICD-10-CM | POA: Diagnosis not present

## 2015-07-27 NOTE — Telephone Encounter (Signed)
-----   Message from [name], MD sent at 07/26/2015  5:16 PM EDT ----- Regarding: Call pt- put in phone note   A1C is down to 5.9%, decrease lantus to 25 units to prevent hypoglycemia

## 2015-07-27 NOTE — Telephone Encounter (Signed)
Call placed to patient and patient made aware.  

## 2015-07-31 DIAGNOSIS — K7581 Nonalcoholic steatohepatitis (NASH): Secondary | ICD-10-CM | POA: Diagnosis not present

## 2015-07-31 DIAGNOSIS — L89152 Pressure ulcer of sacral region, stage 2: Secondary | ICD-10-CM | POA: Diagnosis not present

## 2015-07-31 DIAGNOSIS — D649 Anemia, unspecified: Secondary | ICD-10-CM | POA: Diagnosis not present

## 2015-07-31 DIAGNOSIS — Z4800 Encounter for change or removal of nonsurgical wound dressing: Secondary | ICD-10-CM | POA: Diagnosis not present

## 2015-07-31 DIAGNOSIS — I5030 Unspecified diastolic (congestive) heart failure: Secondary | ICD-10-CM | POA: Diagnosis not present

## 2015-07-31 DIAGNOSIS — I10 Essential (primary) hypertension: Secondary | ICD-10-CM | POA: Diagnosis not present

## 2015-07-31 DIAGNOSIS — R609 Edema, unspecified: Secondary | ICD-10-CM | POA: Diagnosis not present

## 2015-07-31 DIAGNOSIS — E1149 Type 2 diabetes mellitus with other diabetic neurological complication: Secondary | ICD-10-CM | POA: Diagnosis not present

## 2015-07-31 DIAGNOSIS — E1140 Type 2 diabetes mellitus with diabetic neuropathy, unspecified: Secondary | ICD-10-CM | POA: Diagnosis not present

## 2015-07-31 DIAGNOSIS — Z794 Long term (current) use of insulin: Secondary | ICD-10-CM | POA: Diagnosis not present

## 2015-08-01 ENCOUNTER — Other Ambulatory Visit (HOSPITAL_COMMUNITY): Payer: Self-pay

## 2015-08-01 ENCOUNTER — Other Ambulatory Visit: Payer: Self-pay | Admitting: Physician Assistant

## 2015-08-02 ENCOUNTER — Ambulatory Visit (HOSPITAL_COMMUNITY): Payer: Medicare Other

## 2015-08-02 ENCOUNTER — Other Ambulatory Visit: Payer: Self-pay | Admitting: *Deleted

## 2015-08-02 ENCOUNTER — Telehealth: Payer: Self-pay | Admitting: Orthopedic Surgery

## 2015-08-02 ENCOUNTER — Telehealth: Payer: Self-pay | Admitting: *Deleted

## 2015-08-02 DIAGNOSIS — M48061 Spinal stenosis, lumbar region without neurogenic claudication: Secondary | ICD-10-CM

## 2015-08-02 NOTE — Telephone Encounter (Signed)
agree

## 2015-08-02 NOTE — Telephone Encounter (Signed)
Received call from [place], [place] with [facility].   Requested to add OT services to patient care.   VO given for OT to eval and tx as indicated.

## 2015-08-02 NOTE — Telephone Encounter (Signed)
Patient called to relay that he had to call [facility] and cancel his MRI appointment for today, 08/02/15, 12:30p.m, due to having increased leg pain and very limited mobility.  States that [facility] radiologist relayed that he may be more comfortable having his MRI in [place]. Patient aware may need to be re-pre-authorized.  Please advise.

## 2015-08-02 NOTE — Telephone Encounter (Signed)
Refill appropriate and filled per protocol. 

## 2015-08-03 ENCOUNTER — Other Ambulatory Visit: Payer: Self-pay | Admitting: Family Medicine

## 2015-08-03 ENCOUNTER — Other Ambulatory Visit (HOSPITAL_COMMUNITY): Payer: Medicare Other

## 2015-08-03 DIAGNOSIS — Z4800 Encounter for change or removal of nonsurgical wound dressing: Secondary | ICD-10-CM | POA: Diagnosis not present

## 2015-08-03 DIAGNOSIS — K7581 Nonalcoholic steatohepatitis (NASH): Secondary | ICD-10-CM | POA: Diagnosis not present

## 2015-08-03 DIAGNOSIS — E1140 Type 2 diabetes mellitus with diabetic neuropathy, unspecified: Secondary | ICD-10-CM | POA: Diagnosis not present

## 2015-08-03 DIAGNOSIS — R609 Edema, unspecified: Secondary | ICD-10-CM | POA: Diagnosis not present

## 2015-08-03 DIAGNOSIS — Z794 Long term (current) use of insulin: Secondary | ICD-10-CM | POA: Diagnosis not present

## 2015-08-03 DIAGNOSIS — I10 Essential (primary) hypertension: Secondary | ICD-10-CM | POA: Diagnosis not present

## 2015-08-03 DIAGNOSIS — I5030 Unspecified diastolic (congestive) heart failure: Secondary | ICD-10-CM | POA: Diagnosis not present

## 2015-08-03 DIAGNOSIS — D649 Anemia, unspecified: Secondary | ICD-10-CM | POA: Diagnosis not present

## 2015-08-03 DIAGNOSIS — L89152 Pressure ulcer of sacral region, stage 2: Secondary | ICD-10-CM | POA: Diagnosis not present

## 2015-08-03 DIAGNOSIS — E1149 Type 2 diabetes mellitus with other diabetic neurological complication: Secondary | ICD-10-CM | POA: Diagnosis not present

## 2015-08-03 NOTE — Telephone Encounter (Signed)
Refill appropriate and filled per protocol. 

## 2015-08-04 ENCOUNTER — Encounter: Payer: Self-pay | Admitting: Vascular Surgery

## 2015-08-04 DIAGNOSIS — R609 Edema, unspecified: Secondary | ICD-10-CM | POA: Diagnosis not present

## 2015-08-04 DIAGNOSIS — D649 Anemia, unspecified: Secondary | ICD-10-CM | POA: Diagnosis not present

## 2015-08-04 DIAGNOSIS — I10 Essential (primary) hypertension: Secondary | ICD-10-CM | POA: Diagnosis not present

## 2015-08-04 DIAGNOSIS — L89152 Pressure ulcer of sacral region, stage 2: Secondary | ICD-10-CM | POA: Diagnosis not present

## 2015-08-04 DIAGNOSIS — K7581 Nonalcoholic steatohepatitis (NASH): Secondary | ICD-10-CM | POA: Diagnosis not present

## 2015-08-04 DIAGNOSIS — Z4800 Encounter for change or removal of nonsurgical wound dressing: Secondary | ICD-10-CM | POA: Diagnosis not present

## 2015-08-04 DIAGNOSIS — E1140 Type 2 diabetes mellitus with diabetic neuropathy, unspecified: Secondary | ICD-10-CM | POA: Diagnosis not present

## 2015-08-04 DIAGNOSIS — I5030 Unspecified diastolic (congestive) heart failure: Secondary | ICD-10-CM | POA: Diagnosis not present

## 2015-08-04 DIAGNOSIS — E1149 Type 2 diabetes mellitus with other diabetic neurological complication: Secondary | ICD-10-CM | POA: Diagnosis not present

## 2015-08-04 DIAGNOSIS — Z794 Long term (current) use of insulin: Secondary | ICD-10-CM | POA: Diagnosis not present

## 2015-08-07 DIAGNOSIS — I10 Essential (primary) hypertension: Secondary | ICD-10-CM | POA: Diagnosis not present

## 2015-08-07 DIAGNOSIS — Z794 Long term (current) use of insulin: Secondary | ICD-10-CM | POA: Diagnosis not present

## 2015-08-07 DIAGNOSIS — I5030 Unspecified diastolic (congestive) heart failure: Secondary | ICD-10-CM | POA: Diagnosis not present

## 2015-08-07 DIAGNOSIS — Z4800 Encounter for change or removal of nonsurgical wound dressing: Secondary | ICD-10-CM | POA: Diagnosis not present

## 2015-08-07 DIAGNOSIS — E1149 Type 2 diabetes mellitus with other diabetic neurological complication: Secondary | ICD-10-CM | POA: Diagnosis not present

## 2015-08-07 DIAGNOSIS — L89312 Pressure ulcer of right buttock, stage 2: Secondary | ICD-10-CM | POA: Diagnosis not present

## 2015-08-07 DIAGNOSIS — E1140 Type 2 diabetes mellitus with diabetic neuropathy, unspecified: Secondary | ICD-10-CM | POA: Diagnosis not present

## 2015-08-07 DIAGNOSIS — L89152 Pressure ulcer of sacral region, stage 2: Secondary | ICD-10-CM | POA: Diagnosis not present

## 2015-08-07 DIAGNOSIS — D649 Anemia, unspecified: Secondary | ICD-10-CM | POA: Diagnosis not present

## 2015-08-07 DIAGNOSIS — K7581 Nonalcoholic steatohepatitis (NASH): Secondary | ICD-10-CM | POA: Diagnosis not present

## 2015-08-07 DIAGNOSIS — R609 Edema, unspecified: Secondary | ICD-10-CM | POA: Diagnosis not present

## 2015-08-08 ENCOUNTER — Ambulatory Visit (HOSPITAL_COMMUNITY)
Admission: RE | Admit: 2015-08-08 | Discharge: 2015-08-08 | Disposition: A | Discharge status: Home / Self Care | Payer: Medicare Other | Source: Ambulatory Visit | Attending: Vascular Surgery | Admitting: Vascular Surgery

## 2015-08-08 ENCOUNTER — Telehealth: Payer: Self-pay | Admitting: Family Medicine

## 2015-08-08 ENCOUNTER — Ambulatory Visit
Admission: RE | Admit: 2015-08-08 | Discharge: 2015-08-08 | Disposition: A | Discharge status: Home / Self Care | Payer: Medicare Other | Source: Ambulatory Visit | Attending: Orthopedic Surgery | Admitting: Orthopedic Surgery

## 2015-08-08 ENCOUNTER — Encounter: Payer: Self-pay | Admitting: Vascular Surgery

## 2015-08-08 ENCOUNTER — Ambulatory Visit (INDEPENDENT_AMBULATORY_CARE_PROVIDER_SITE_OTHER): Payer: Medicare Other | Admitting: Vascular Surgery

## 2015-08-08 VITALS — BP 173/80 | HR 71 | Temp 97.4°F | Resp 18 | Ht 59.0 in | Wt 212.0 lb

## 2015-08-08 DIAGNOSIS — M4806 Spinal stenosis, lumbar region: Secondary | ICD-10-CM | POA: Diagnosis not present

## 2015-08-08 DIAGNOSIS — I872 Venous insufficiency (chronic) (peripheral): Secondary | ICD-10-CM | POA: Insufficient documentation

## 2015-08-08 DIAGNOSIS — I890 Lymphedema, not elsewhere classified: Secondary | ICD-10-CM

## 2015-08-08 DIAGNOSIS — M48061 Spinal stenosis, lumbar region without neurogenic claudication: Secondary | ICD-10-CM

## 2015-08-08 NOTE — Progress Notes (Signed)
Filed Vitals:   08/08/15 1110 08/08/15 1111  BP: 175/75 173/80  Pulse: 71 71  Temp: 97.4 F (36.3 C)   Resp: 18   Height: 4' 11"  (1.499 m)   Weight: 212 lb (96.163 kg)   SpO2: 96%

## 2015-08-08 NOTE — Telephone Encounter (Signed)
[facility] CALLING TO GET VERBAL ORDER FOR THIS PATIENT  [phone]

## 2015-08-08 NOTE — Progress Notes (Signed)
[facility] [place]  Patient name: Michael [name] MRN: [id number] DOB: 11/04/1945 Sex: male  REASON FOR CONSULT: Lower extremity swelling, rule out venous hypertension  HPI: Michael [name] is a 69 y.o. male, who is here today for evaluation of bilateral lower extremity swelling. He is a has multiple medical difficulties as outlined below. He presents today to rule out DVT or other venous pathology. He reports that no one is discuss the possibility of lymphedema with him in the past. He reports that he has tried diuresis and is unfortunately had the days where he has had to go to the restroom in addition to 20 times but still has swelling in his lower extremities. Also does have a history of congestive heart failure. He has no history of ulcerations and no lower extremity arterial insufficiency. He does have beer degenerative disc disease make it difficult for him to get around as well.  Past Medical History  Diagnosis Date  . COPD (chronic obstructive pulmonary disease) ([place])   . Type 2 diabetes mellitus ([place])   . Hyperlipidemia   . Essential hypertension   . Iron deficiency anemia   . NASH (nonalcoholic steatohepatitis)   . Diabetic neuropathy ([place])   . GERD (gastroesophageal reflux disease)   . Arthritis   . Depression   . Diastolic dysfunction   . Left knee DJD   . Lumbar spinal stenosis   . DDD (degenerative disc disease), lumbosacral   . Peripheral edema     Family History  Problem Relation Age of Onset  . Heart disease Mother   . Hyperlipidemia Mother   . Hypertension Mother   . Depression Mother   . Diabetes Mother   . Rectal cancer Mother   . Cancer Mother   . Heart disease Father   . Hypertension Father   . Hyperlipidemia Father   . Diabetes Father   . Cancer Father   . Heart disease Sister   . Hyperlipidemia Sister   . Hypertension Sister   . Diabetes Sister   . Diabetes Brother   . Heart disease Sister   . Hyperlipidemia Sister   .  Hypertension Sister   . Heart disease Sister   . Hyperlipidemia Sister   . Hypertension Sister   . Diabetes Sister   . Diabetes Brother     SOCIAL HISTORY: Social History   Social History  . Marital Status: Widowed    Spouse Name: N/A  . Number of Children: N/A  . Years of Education: 12   Occupational History  . Not on file.   Social History Main Topics  . Smoking status: Former Smoker -- 2.50 packs/day for 44 years    Types: Cigarettes    Quit date: 10/01/1999  . Smokeless tobacco: Former Systems developer    Quit date: 10/01/1999  . Alcohol Use: No  . Drug Use: No  . Sexual Activity: Yes    Birth Control/ Protection: None   Other Topics Concern  . Not on file   Social History Narrative    No Known Allergies  Current Outpatient Prescriptions  Medication Sig Dispense Refill  . ACCU-CHEK AVIVA PLUS test strip     . aspirin EC 81 MG tablet Take 81 mg by mouth daily.    . B-D INS SYRINGE 0.5CC/31GX5/16 31G X 5/16" 0.5 ML MISC     . benazepril (LOTENSIN) 40 MG tablet TAKE ONE TABLET BY MOUTH DAILY. 90 tablet 1  . bumetanide (BUMEX) 2 MG tablet Take  1 tablet (2 mg total) by mouth 2 (two) times daily. 60 tablet 1  . BYSTOLIC 10 MG tablet TAKE ONE TABLET BY MOUTH DAILY. 30 tablet 6  . clobetasol cream (TEMOVATE) 0.05 % APPLY TO AFFECTED AREA 2 TIMES DAILY. 45 g 11  . diazepam (VALIUM) 5 MG tablet One tab by mouth prior to MRI 1 tablet 0  . diclofenac sodium (VOLTAREN) 1 % GEL Apply 4 g topically 4 (four) times daily. 5 Tube 5  . Ferrous Sulfate (IRON) 325 (65 FE) MG TABS Take 1 tablet by mouth daily.     Marland Kitchen gabapentin (NEURONTIN) 400 MG capsule TAKE ONE CAPSULE BY MOUTH THREE TIMES DAILY. 90 capsule 0  . glipiZIDE (GLUCOTROL) 10 MG tablet TAKE ONE TABLET BY MOUTH TWICE DAILY BEFORE MEALS. 180 tablet 1  . Insulin Glargine (LANTUS) 100 UNIT/ML Solostar Pen Inject 30 Units into the skin at bedtime. 15 mL 11  . levothyroxine (SYNTHROID, LEVOTHROID) 50 MCG tablet Take 1 tablet (50 mcg  total) by mouth daily before breakfast. 30 tablet 1  . Magnesium 400 MG TABS Take 400 mg by mouth 2 (two) times daily. 60 tablet 6  . meloxicam (MOBIC) 15 MG tablet Take 15 mg by mouth daily.     . metFORMIN (GLUCOPHAGE) 1000 MG tablet TAKE ONE TABLET BY MOUTH TWICE DAILY WITH A MEAL. 60 tablet 6  . metolazone (ZAROXOLYN) 2.5 MG tablet TAKE 1 TAB WEEKLY AS NEEDED FOR SEVERE SWELLING/WEIGHT GAIN 90 tablet 3  . nystatin cream (MYCOSTATIN) Apply 1 application topically 2 (two) times daily. 45 g 1  . omeprazole (PRILOSEC) 20 MG capsule TAKE ONE CAPSULE BY MOUTH TWICE DAILY. 60 capsule 11  . oxyCODONE-acetaminophen (PERCOCET) 7.5-325 MG tablet Take 1 tablet by mouth every 6 (six) hours as needed for severe pain. 90 tablet 0  . potassium chloride (K-DUR) 10 MEQ tablet Take 2 tablets in the AM / Take 1 tablet in the PM 90 tablet 3  . pravastatin (PRAVACHOL) 40 MG tablet Take 1 tablet (40 mg total) by mouth every evening. 90 tablet 1   No current facility-administered medications for this visit.    REVIEW OF SYSTEMS:  [X]  denotes positive finding, [ ]  denotes negative finding Cardiac  Comments:  Chest pain or chest pressure:    Shortness of breath upon exertion:    Short of breath when lying flat: x   Irregular heart rhythm:        Vascular    Pain in calf, thigh, or hip brought on by ambulation:    Pain in feet at night that wakes you up from your sleep:     Blood clot in your veins:    Leg swelling:         Pulmonary    Oxygen at home:    Productive cough:     Wheezing:         Neurologic    Sudden weakness in arms or legs:     Sudden numbness in arms or legs:     Sudden onset of difficulty speaking or slurred speech:    Temporary loss of vision in one eye:     Problems with dizziness:         Gastrointestinal    Blood in stool:     Vomited blood:         Genitourinary    Burning when urinating:     Blood in urine:        Psychiatric    Major  depression:           Hematologic    Bleeding problems:    Problems with blood clotting too easily:        Skin    Rashes or ulcers:        Constitutional    Fever or chills:      PHYSICAL EXAM: Filed Vitals:   08/08/15 1110 08/08/15 1111  BP: 175/75 173/80  Pulse: 71 71  Temp: 97.4 F (36.3 C)   Resp: 18   Height: 4' 11"  (1.499 m)   Weight: 212 lb (96.163 kg)   SpO2: 96%     GENERAL: The patient is a well-nourished male, in no acute distress. The vital signs are documented above. VASCULAR: 2+ dorsalis pedis pulses bilaterally and 2+ radial pulses bilaterally PULMONARY: There is good air exchange  ABDOMEN: Soft and non-tender  MUSCULOSKELETAL: There are no major deformities or cyanosis. NEUROLOGIC: No focal weakness or paresthesias are detected. SKIN: Thickening and changes on his distal thighs extending onto the dorsum of his foot classic for lymphedema. PSYCHIATRIC: The patient has a normal affect.  DATA:  Venous duplex shows no evidence of DVT. No superficial or deep venous reflux in the left leg. Does have some reflux in his right common femoral and femoral vein. Also some reflux in his great saphenous vein.  MEDICAL ISSUES: Had long discussion with patient regarding his swelling. Reassured him does not have any evidence of DVT or other dangerous issues. Also when he is normal arterial flow. He does seem to have classic primary lymphedema bilaterally with extensive skin changes extending onto the dorsum of his feet. Explained the difficulty in management of this. Explained this would involve elevation compression and evaluation with a lymphedema specialist for a potential consideration of therapeutic massage. He was reassured that this was not limb threatening or life threatening. We will give him contact information for lymphedema therapist in [place] for further evaluation. He will see [place] again on an as-needed basis   Evelynn Hench [facility] [employer]:  [phone]

## 2015-08-09 DIAGNOSIS — I10 Essential (primary) hypertension: Secondary | ICD-10-CM | POA: Diagnosis not present

## 2015-08-09 DIAGNOSIS — Z794 Long term (current) use of insulin: Secondary | ICD-10-CM | POA: Diagnosis not present

## 2015-08-09 DIAGNOSIS — E1140 Type 2 diabetes mellitus with diabetic neuropathy, unspecified: Secondary | ICD-10-CM | POA: Diagnosis not present

## 2015-08-09 DIAGNOSIS — R609 Edema, unspecified: Secondary | ICD-10-CM | POA: Diagnosis not present

## 2015-08-09 DIAGNOSIS — L89152 Pressure ulcer of sacral region, stage 2: Secondary | ICD-10-CM | POA: Diagnosis not present

## 2015-08-09 DIAGNOSIS — I5030 Unspecified diastolic (congestive) heart failure: Secondary | ICD-10-CM | POA: Diagnosis not present

## 2015-08-09 DIAGNOSIS — E1149 Type 2 diabetes mellitus with other diabetic neurological complication: Secondary | ICD-10-CM | POA: Diagnosis not present

## 2015-08-09 DIAGNOSIS — Z4800 Encounter for change or removal of nonsurgical wound dressing: Secondary | ICD-10-CM | POA: Diagnosis not present

## 2015-08-09 DIAGNOSIS — D649 Anemia, unspecified: Secondary | ICD-10-CM | POA: Diagnosis not present

## 2015-08-09 DIAGNOSIS — K7581 Nonalcoholic steatohepatitis (NASH): Secondary | ICD-10-CM | POA: Diagnosis not present

## 2015-08-09 NOTE — Telephone Encounter (Signed)
Call placed tp [facility].   Kat, OT requested to extend OT services for upper extremity strengthening, and ADL functioning 1 week x1, 3week x2.   Verbal order given.

## 2015-08-09 NOTE — Telephone Encounter (Signed)
Agree with above 

## 2015-08-11 ENCOUNTER — Telehealth: Payer: Self-pay | Admitting: *Deleted

## 2015-08-11 MED ORDER — OXYCODONE-ACETAMINOPHEN 7.5-325 MG PO TABS: 1.0000 | ORAL_TABLET | Freq: Four times a day (QID) | ORAL | Status: DC | PRN

## 2015-08-11 NOTE — Telephone Encounter (Signed)
Prescription printed postdated and patient made aware to come to office to pick up.

## 2015-08-11 NOTE — Telephone Encounter (Signed)
To MD

## 2015-08-11 NOTE — Telephone Encounter (Signed)
Okay to refill, but he can not get until Monday, too early

## 2015-08-11 NOTE — Telephone Encounter (Signed)
Pt called needing refill on Oxycodone 7.5-325 mg  LRF: 07/17/15  LOV: 10/254/16  States would like to pick up today  Call back number [phone]

## 2015-08-14 ENCOUNTER — Telehealth: Payer: Self-pay | Admitting: *Deleted

## 2015-08-14 ENCOUNTER — Other Ambulatory Visit: Payer: Self-pay | Admitting: *Deleted

## 2015-08-14 DIAGNOSIS — M48061 Spinal stenosis, lumbar region without neurogenic claudication: Secondary | ICD-10-CM

## 2015-08-14 NOTE — Telephone Encounter (Signed)
appt with Dr Donnetta Hutching 08/08/15

## 2015-08-14 NOTE — Telephone Encounter (Signed)
REFERRAL FAXED TO DR Carloyn Manner AND DR Lyla Son

## 2015-08-15 DIAGNOSIS — L89312 Pressure ulcer of right buttock, stage 2: Secondary | ICD-10-CM | POA: Diagnosis not present

## 2015-08-15 DIAGNOSIS — R269 Unspecified abnormalities of gait and mobility: Secondary | ICD-10-CM | POA: Diagnosis not present

## 2015-08-15 DIAGNOSIS — R809 Proteinuria, unspecified: Secondary | ICD-10-CM | POA: Diagnosis not present

## 2015-08-16 ENCOUNTER — Ambulatory Visit (HOSPITAL_COMMUNITY): Payer: Medicare Other | Attending: Vascular Surgery | Admitting: Physical Therapy

## 2015-08-16 DIAGNOSIS — M25562 Pain in left knee: Secondary | ICD-10-CM | POA: Diagnosis not present

## 2015-08-16 DIAGNOSIS — R262 Difficulty in walking, not elsewhere classified: Secondary | ICD-10-CM | POA: Diagnosis not present

## 2015-08-16 DIAGNOSIS — M25561 Pain in right knee: Secondary | ICD-10-CM | POA: Insufficient documentation

## 2015-08-16 DIAGNOSIS — I890 Lymphedema, not elsewhere classified: Secondary | ICD-10-CM

## 2015-08-16 NOTE — Therapy (Addendum)
[place], [place], [address] Phone: [phone]   Fax:  [phone]  Physical Therapy Evaluation  Patient Details  Name: Michael [name] MRN: [id number] Date of Birth: 08-19-1946 Referring Provider: Dr. Donnetta Hutching   Encounter Date: 08/16/2015      PT End of Session - 08/16/15 1442    Visit Number 1   Number of Visits 12   Date for PT Re-Evaluation 09/22/15   PT Start Time 1300   PT Stop Time 1426   PT Time Calculation (min) 86 min   Activity Tolerance Patient tolerated treatment well   Behavior During Therapy [facility] for tasks assessed/performed      Past Medical History  Diagnosis Date  . COPD (chronic obstructive pulmonary disease) ([place])   . Type 2 diabetes mellitus ([place])   . Hyperlipidemia   . Essential hypertension   . Iron deficiency anemia   . NASH (nonalcoholic steatohepatitis)   . Diabetic neuropathy ([place])   . GERD (gastroesophageal reflux disease)   . Arthritis   . Depression   . Diastolic dysfunction   . Left knee DJD   . Lumbar spinal stenosis   . DDD (degenerative disc disease), lumbosacral   . Peripheral edema     Past Surgical History  Procedure Laterality Date  . Hernia repair    . Shoulder surgery      Rght-rotator cuff  . Lipoma removal      Stomach  . Knee arthroscopy with medial menisectomy Left 11/06/2012    Procedure: KNEE ARTHROSCOPY WITH MEDIAL MENISECTOMY;  Surgeon: Carole Civil, MD;  Location: [facility];  Service: Orthopedics;  Laterality: Left;  . Lumbar laminectomy/decompression microdiscectomy Left 08/30/2013    Procedure: LUMBAR LAMINECTOMY/DECOMPRESSION MICRODISCECTOMY LEFT  LUMBAR TWO THREE;  Surgeon: Otilio Connors, MD;  Location: [place] [facility]UR[facility] ORS;  Service: Neurosurgery;  Laterality: Left;  . Egd with enteroscopy  2011    [facility]: normal esophagus and stomach. Normal duodenum, jejunum. No evidence of AVMs.   . Colonoscopy  2011    [facility]: normal colon, normal distal  ileum  . Colonoscopy with propofol N/A 07/21/2014    Procedure: ATTEMPTED COLONOSCOPY WITH PROPOFOL-HAD TO STOP DUE TO BRADYCARDIA;  Surgeon: Daneil Dolin, MD;  Location: [facility];  Service: Endoscopy;  Laterality: N/A;  . Esophagogastroduodenoscopy (egd) with propofol N/A 07/21/2014    Procedure: ESOPHAGOGASTRODUODENOSCOPY (EGD) WITH PROPOFOL;  Surgeon: Daneil Dolin, MD;  Location: [facility];  Service: Endoscopy;  Laterality: N/A;  . Esophageal biopsy N/A 07/21/2014    Procedure: BIOPSY;  Surgeon: Daneil Dolin, MD;  Location: [facility];  Service: Endoscopy;  Laterality: N/A;    There were no vitals filed for this visit.  Visit Diagnosis:  Arthralgia of both lower legs  Lymphedema  Difficulty walking      Subjective Assessment - 08/16/15 1307    Subjective Michael [name] states that for the past year he has been having increased swelling in B LE.  He was placed on diuertic by his family MD but the swelling has not gone down.Marland Kitchen  He states he has gotten to a point where he uses a walker to walk.   This is secondary to both the swelling in his legs,( heaviness), as well as the arthritis in his Lt knee. He went to see Dr. Donnetta Hutching who has referred him to therapy for lymphedema .   Pertinent History COPD, DM, HNT, OA, JDJ Lt knee , DDD lumbar spine .  How long can you sit comfortably? no problem   How long can you stand comfortably? less than 2 minutes    How long can you walk comfortably? with walker less than 2 minutes.    Currently in Pain? Yes   Pain Score 10-Worst pain ever   Pain Location Leg   Pain Orientation Left;Right   Pain Descriptors / Indicators Tightness;Heaviness   Pain Onset More than a month ago   Pain Frequency Constant            [facility] PT Assessment - 08/16/15 0001    Assessment   Medical Diagnosis B lymphedema    Referring Provider Dr. Donnetta Hutching    Onset Date/Surgical Date 08/15/14   Next MD Visit --  as needed    Prior Therapy not for this diagnosis    Precautions    Precautions None   Restrictions   Weight Bearing Restrictions No   Balance Screen   Has the patient fallen in the past 6 months No   Has the patient had a decrease in activity level because of a fear of falling?  Yes   Is the patient reluctant to leave their home because of a fear of falling?  Yes   [place] residence   Living Arrangements Alone   Prior Function   Level of [place] device for independence;Needs assistance with homemaking   Laundry --  has girl that does this    Light Housekeeping Moderate   Shopping --  has girl to assit in shopping   Vocation On disability   Cognition   Overall Cognitive Status Within Functional Limits for tasks assessed   Observation/Other Assessments   Other Surveys  --  life impact skills 62    Observation/Other Assessments-Edema    Edema --  + stemmer, + papillomas        Date 08/16/2015 08/16/2015   Rt LT   MTP 27.50 28.2  ankle 32.00 34.20  4cm 30.80 32.20  8cm 33.30 32.10  12 cm 36.60 35.50  16cm 39.80 38.80  20cm 42.50 42.00  24cm 42.80 43.60  28cm 39.80 43.60  32cm 38.00 41.00  36cm    40cm    44cm    48cm                        Sum of squares 13427.51 14044.74  Total Volume 4274.1107 7121.9758                    [facility] Adult PT Treatment/Exercise - 08/16/15 0001    Manual Therapy   Manual Therapy Manual Lymphatic Drainage (MLD)   Manual Lymphatic Drainage (MLD) Pt recieved supraclavicular, deep and superfical abdominal followed by routing fluid using inguinal/axillary anastomosis followed by LE B.  Manual was performed anteriorly only secondary to time restraints..                PT Education - 08/16/15 1435    Education provided Yes   Education Details Pt educated on the chronic nature of lymphedema, Given exercises to promote lymph system circulation and diaphragmic breathing.  Pt was also given information sheet on lymphedema;  things to avoid and risks of including cellulitis.    Person(s) Educated Patient   Methods Explanation;Handout   Comprehension Verbalized understanding;Need further instruction          PT Short Term Goals - 08/16/15 1449    PT SHORT TERM GOAL #  1   Title Pt will have a 15% reduction in 2 233ks for improved fitting of clothing    Time 2   Period Weeks   PT SHORT TERM GOAL #2   Title Pt will be able to identify the signs and symptoms of cellulitis due to prolong lymphstasisi    Time 1   Period Weeks   PT SHORT TERM GOAL #3   Title Pt will have a 3 cm decrease at dorsum of foot to improve ability to don shoes    Time 2   Period Weeks   PT SHORT TERM GOAL #4   Title Pt will be I in remedial exercises to promote lymph circulaiton    Time 2   Period Weeks           PT Long Term Goals - 2015-08-19 1451    PT LONG TERM GOAL #1   Title Pt will have a 30% reduction in fluid to promote walking ( pt to be walking at least 5 minutes at a time)   Time 4   Period Weeks   PT LONG TERM GOAL #2   Title Pt to have acquired comprssion garment for LE    Time 4   Period Weeks   PT LONG TERM GOAL #3   Title Pt pain level to be decreased to no greater than a 5   Time 4   Period Weeks   PT LONG TERM GOAL #4   Title Pt life impact to decrease by at least 15 points    Time 4   Period Weeks               Plan - 2015/08/19 1443    Clinical Impression Statement Mr. Sellitto is a 69 yo male who has had progressive swelling of his LE for over a year now.  He states that he has been placed on diuertics with no affect.  He was then referred to a vascular surgeon who has referred him to physical therapy.  Mr. Loya has + induration, + stemmer sign and + papillomas on B LE with Lt greater than the right.  He will benefit from skilled therapy for manual decongestive techniques followed by multi -layer short stretch bandaging to reduce the volume in his LE so that he may comfortably wear  compession garments to contain his swelling.  It was also noted that his abdominal area had significant induration.  He was urged to get a spandex t-shirt to give minimal compression to his abdominal area.     Pt will benefit from skilled therapeutic intervention in order to improve on the following deficits Pain;Difficulty walking;Decreased activity tolerance;Increased edema   Rehab Potential Good   PT Frequency 3x / week   PT Duration 6 weeks   PT Treatment/Interventions Manual lymph drainage;Compression bandaging;Patient/family education   PT Next Visit Plan Pt was given order form for short stretch bandages.  When pt returns foam will need to be cut for LE folllowed by manual techniques and bandaging.    Consulted and Agree with Plan of Care Patient          G-Codes - 08-19-2015 1454    Functional Assessment Tool Used Life impace score    Functional Limitation Other PT primary   Other PT Primary Current Status (X3244) At least 60 percent but less than 80 percent impaired, limited or restricted   Other PT Primary Goal Status (W1027) At least 40 percent but less than 60 percent impaired, limited  or restricted       Problem List Patient Active Problem List   Diagnosis Date Noted  . Diastolic CHF ([place]) 27/61/4709  . Diastolic dysfunction 29/57/4734  . Venous stasis dermatitis 04/20/2015  . Stage II pressure ulcer of sacral region 02/13/2015  . Atypical nevi 01/18/2015  . Intertriginous candidiasis 12/23/2014  . Hypothyroidism 11/30/2014  . Bradycardia 07/21/2014  . Sinus pause 07/21/2014  . Liver fibrosis ([place]) 06/29/2014  . IDA (iron deficiency anemia) 06/29/2014  . Iron deficiency anemia 05/31/2014  . Hepatomegaly 05/25/2014  . Difficulty walking 05/03/2014  . Knee pain, chronic 05/03/2014  . Stiffness of joint, not elsewhere classified, pelvic region and thigh 05/03/2014  . Dermatitis 04/12/2014  . HNP (herniated nucleus pulposus), lumbar 08/30/2013  . Spinal stenosis of  lumbar region 07/06/2013  . Carpal tunnel syndrome 04/13/2013  . Peripheral edema 04/13/2013  . Status post arthroscopy of left knee 04/13/2013  . DDD (degenerative disc disease), lumbosacral 04/13/2013  . Peripheral neuropathy ([place]) 01/05/2013  . S/P arthroscopy of left knee 11/09/2012  . Osteoarthritis of left knee 10/27/2012  . Meniscus tear 10/07/2012  . Patellar tendonitis 07/01/2012  . NASH (nonalcoholic steatohepatitis) 05/22/2007  . Type II diabetes mellitus with neurological manifestations ([place]) 05/20/2007  . Hyperlipidemia 10/15/2006  . OBESITY, MORBID 10/15/2006  . Essential hypertension 10/15/2006  . GERD 10/15/2006    Rayetta Humphrey, PT CLT [phone] 08/16/2015, 2:56 PM  [place] [address] [place], [place], [address] Phone: [phone]   Fax:  [phone]  Name: HIKEEM ANDERSSON MRN: [id number] Date of Birth: 12-07-1945   09/15/15  PHYSICAL THERAPY DISCHARGE SUMMARY  Visits from Start of Care: 1  Current functional level related to goals / functional outcomes: same   Remaining deficits: same   Education / Equipment: none  Plan: Patient agrees to discharge.  Patient goals were not met. Patient is being discharged due to financial reasons.  ?????       Pt has not shown since initial evaluation.  Contacted pt today who states that he is not financially able to afford therapy at this time.   Rayetta Humphrey, [place] CLT [phone]

## 2015-08-28 ENCOUNTER — Telehealth (HOSPITAL_COMMUNITY): Payer: Self-pay | Admitting: Physical Therapy

## 2015-08-28 NOTE — Telephone Encounter (Signed)
Pt called to cx apptments until Dec 12th, Nurse at Dr. Dorian Heckle office said not to wrap his legs, he will put the cream on sores from the MD and see [place] on Dec. 12th. This is the patient's decision, pt was offered that we would call MD and talk with Dr. Vertell Novak  NF 08/28/15

## 2015-08-29 ENCOUNTER — Ambulatory Visit (HOSPITAL_COMMUNITY): Payer: Medicare Other | Admitting: Physical Therapy

## 2015-08-31 ENCOUNTER — Encounter (HOSPITAL_COMMUNITY): Payer: Medicare Other | Admitting: Physical Therapy

## 2015-09-04 ENCOUNTER — Encounter (HOSPITAL_COMMUNITY): Payer: Medicare Other | Admitting: Physical Therapy

## 2015-09-06 ENCOUNTER — Encounter (HOSPITAL_COMMUNITY): Payer: Medicare Other | Admitting: Physical Therapy

## 2015-09-06 ENCOUNTER — Other Ambulatory Visit: Payer: Self-pay | Admitting: Family Medicine

## 2015-09-06 DIAGNOSIS — L89312 Pressure ulcer of right buttock, stage 2: Secondary | ICD-10-CM | POA: Diagnosis not present

## 2015-09-06 NOTE — Telephone Encounter (Signed)
Refill appropriate and filled per protocol. 

## 2015-09-08 ENCOUNTER — Encounter: Payer: Self-pay | Admitting: Family Medicine

## 2015-09-08 ENCOUNTER — Ambulatory Visit (INDEPENDENT_AMBULATORY_CARE_PROVIDER_SITE_OTHER): Payer: Medicare Other | Admitting: Family Medicine

## 2015-09-08 VITALS — BP 138/72 | HR 78 | Temp 98.3°F | Resp 18 | Wt 217.0 lb

## 2015-09-08 DIAGNOSIS — S46911A Strain of unspecified muscle, fascia and tendon at shoulder and upper arm level, right arm, initial encounter: Secondary | ICD-10-CM | POA: Diagnosis not present

## 2015-09-08 DIAGNOSIS — I890 Lymphedema, not elsewhere classified: Secondary | ICD-10-CM

## 2015-09-08 DIAGNOSIS — S161XXA Strain of muscle, fascia and tendon at neck level, initial encounter: Secondary | ICD-10-CM

## 2015-09-08 MED ORDER — OXYCODONE-ACETAMINOPHEN 7.5-325 MG PO TABS: 1.0000 | ORAL_TABLET | Freq: Four times a day (QID) | ORAL | Status: DC | PRN

## 2015-09-08 MED ORDER — DIAZEPAM 5 MG PO TABS: 5.0000 mg | ORAL_TABLET | Freq: Every evening | ORAL | Status: DC | PRN

## 2015-09-08 NOTE — Assessment & Plan Note (Addendum)
Diagnosis now is more lymphedema. Advised him to go ahead and proceed with lymphedema clinic for next week. He is not taking the metaLAZON on a regular basis he does not get much benefit from this. Continue to discuss importance of dietary adherence. Any movement would actually help but he is very limited and is now in a wheelchair most of the day.   He has an appointment set up with neurosurgery advised to go and discuss his concerns I don't think that he is a good surgical candidate

## 2015-09-08 NOTE — Progress Notes (Signed)
Patient ID: Michael [name], male   DOB: 09-22-1946, 69 y.o.   MRN: [id number]   Subjective:    Patient ID: Michael [name], male    DOB: 11-Feb-1946, 69 y.o.   MRN: [id number]  Patient presents for R Side Neck/ Shoulder Pain  patient here with right sided neck pain and shoulder pain. He has been sleeping in his recliner because he's had difficulty getting into his hospital bed. He denies any chest pain or shortness of breath associated. When he tries to raise his arm up he gets a catching in his shoulder and a pulling sensation in his neck. He has used some topical anti-inflammatory which is health as well as a heating pad.  He has not had any injury and no recent falls. He was seen by vascular for his chronic edema of the diagnosis is now lymphedema. He was seen at the lymphedema clinic once he did purchase his supplies and had some open wounds for couple weeks now he is going to go back to the clinic next week to start the process of wrapping again. We did try these compression wraps before that this may be a little bit different from what he has had in the past but we did not have any success with his swelling.  At this point orthopedics has decided that he is too high risk from other comorbidities to have knee replacement. He had another MRI done which showed his persistent spinal stenosis and degenerative changes there refer him to a neurosurgeon for possible epidural injections that he is not having any true radicular symptoms. He is on the fence about whether he wants injections.    Review Of Systems:  GEN- denies fatigue, fever, weight loss,weakness, recent illness HEENT- denies eye drainage, change in vision, nasal discharge, CVS- denies chest pain, palpitations RESP- denies SOB, cough, wheeze ABD- denies N/V, change in stools, abd pain GU- denies dysuria, hematuria, dribbling, incontinence MSK- + joint pain, muscle aches, injury Neuro- denies headache, dizziness, syncope, seizure  activity       Objective:    BP 138/72 mmHg  Pulse 78  Temp(Src) 98.3 F (36.8 C) (Oral)  Resp 18  Wt 217 lb (98.431 kg) GEN- NAD, alert and oriented x3, in wheelchair, weight up 5lbs  HEENT-PERRL, EOMI non icteric pink conjunctiva  Neck- no JVD, fair ROM, pain with rotation to left, mild spasm of trapezium and tenderness along SCM CVS- RRR, no murmur RESP-CTAB EXT- 1+  Pitting edema bilat  MSK- decreased ROM Right shoulder compared to left, neg empty can, biceps in tact, unable to raise arm above shoulder height without pain       Assessment & Plan:      Problem List Items Addressed This Visit    Lymphedema    Diagnosis now is more lymphedema. Advised him to go ahead and proceed with lymphedema clinic for next week. He is not taking the metaLAZON on a regular basis he does not get much benefit from this. Continue to discuss importance of dietary adherence. Any movement would actually help but he is very limited and is now in a wheelchair most of the day.   He has an appointment set up with neurosurgery advised to go and discuss his concerns I don't think that he is a good surgical candidate       Other Visit Diagnoses    Neck strain, initial encounter    -  Primary    Very acute-past few days, sleeping in recliner,  will give Valium is in on Mobic, percocet, use voltaren topical as needed and heating pad, hold on imaging    Shoulder strain, right, initial encounter        Discussed home ROM exercises as well       Note: This dictation was prepared with Dragon dictation along with smaller phrase technology. Any transcriptional errors that result from this process are unintentional.

## 2015-09-08 NOTE — Patient Instructions (Signed)
Take valium at bedtime Pain meds refilled Proceed with the lymphedema clinic F/U as previous

## 2015-09-11 ENCOUNTER — Encounter (HOSPITAL_COMMUNITY): Payer: Medicare Other | Admitting: Physical Therapy

## 2015-09-13 ENCOUNTER — Encounter (HOSPITAL_COMMUNITY): Payer: Medicare Other | Admitting: Physical Therapy

## 2015-09-14 ENCOUNTER — Other Ambulatory Visit (HOSPITAL_COMMUNITY): Payer: Medicare Other

## 2015-09-14 DIAGNOSIS — L89312 Pressure ulcer of right buttock, stage 2: Secondary | ICD-10-CM | POA: Diagnosis not present

## 2015-09-15 ENCOUNTER — Ambulatory Visit (HOSPITAL_COMMUNITY): Payer: Medicare Other | Admitting: Physical Therapy

## 2015-09-15 ENCOUNTER — Telehealth (HOSPITAL_COMMUNITY): Payer: Self-pay | Admitting: Physical Therapy

## 2015-09-15 NOTE — Telephone Encounter (Signed)
Called pt about missed appointment.  Pt states that he is not able to afford the co-pay at this time and to cancel all further appointments.   Rayetta Humphrey, [place] CLT [phone]

## 2015-09-18 ENCOUNTER — Encounter (HOSPITAL_COMMUNITY): Payer: Medicare Other | Admitting: Physical Therapy

## 2015-09-20 ENCOUNTER — Encounter (HOSPITAL_COMMUNITY): Payer: Medicare Other | Admitting: Physical Therapy

## 2015-09-21 ENCOUNTER — Encounter (HOSPITAL_COMMUNITY): Payer: Self-pay

## 2015-09-21 DIAGNOSIS — T1490 Injury, unspecified: Secondary | ICD-10-CM | POA: Diagnosis not present

## 2015-09-26 ENCOUNTER — Encounter (HOSPITAL_COMMUNITY): Payer: Medicare Other | Admitting: Physical Therapy

## 2015-09-28 ENCOUNTER — Encounter (HOSPITAL_COMMUNITY): Payer: Medicare Other | Admitting: Physical Therapy

## 2015-09-29 ENCOUNTER — Ambulatory Visit (INDEPENDENT_AMBULATORY_CARE_PROVIDER_SITE_OTHER): Payer: Medicare Other | Admitting: Family Medicine

## 2015-09-29 ENCOUNTER — Encounter: Payer: Self-pay | Admitting: Family Medicine

## 2015-09-29 VITALS — BP 138/72 | HR 88 | Temp 97.8°F | Resp 16

## 2015-09-29 DIAGNOSIS — I872 Venous insufficiency (chronic) (peripheral): Secondary | ICD-10-CM

## 2015-09-29 DIAGNOSIS — I8311 Varicose veins of right lower extremity with inflammation: Secondary | ICD-10-CM | POA: Diagnosis not present

## 2015-09-29 DIAGNOSIS — I890 Lymphedema, not elsewhere classified: Secondary | ICD-10-CM

## 2015-09-29 DIAGNOSIS — I8312 Varicose veins of left lower extremity with inflammation: Secondary | ICD-10-CM | POA: Diagnosis not present

## 2015-09-29 MED ORDER — OXYCODONE-ACETAMINOPHEN 7.5-325 MG PO TABS: 1.0000 | ORAL_TABLET | Freq: Four times a day (QID) | ORAL | Status: DC | PRN

## 2015-09-29 NOTE — Progress Notes (Signed)
Patient ID: Michael [name], male   DOB: [birth year], 69 y.o.   MRN: [id number]   Subjective:    Patient ID: Michael [name], male    DOB: 10-19-1945, 69 y.o.   MRN: [id number]  Patient presents for LE Edema/ Pain  patient here due to worsening lower extremity edema. He has known chronic venous stasis/lymphedema. He has been seen by vascular cardiology and myself. He was supposed be set up to have lipedema treatments on however he is unable to afford this and they advised him that he has to come 5 days a week in order to be in her program and he cannot afford to $200 a week. He also suffers with the damage changes in his back as well as severe arthritis in his knees which he has had arthroscopic done on and still has no improvement in his function. He pretty much sits in his wheelchair all day. For the past few days he has noticed some weeping from his legs states that his socks get covered in a yellowish clear fluid comes out of his legs because they're so swollen. He states that they will often get this large parties not have this much weeping. He did take extra fluid pills as directed by cardiology but has not seen any improvement.  No CP, no SOB   Review Of Systems:  GEN- denies fatigue, fever, weight loss,weakness, recent illness HEENT- denies eye drainage, change in vision, nasal discharge, CVS- denies chest pain, palpitations RESP- denies SOB, cough, wheeze MSK- + joint pain, muscle aches, injury Neuro- denies headache, dizziness, syncope, seizure activity       Objective:    BP 138/72 mmHg  Pulse 88  Temp(Src) 97.8 F (36.6 C) (Oral)  Resp 16 GEN- NAD, alert and oriented x3,sitting in wheelchair  HEENT- PERRL, EOMI, non injected sclera, pink conjunctiva, MMM, oropharynx clear Neck- Supple, no JVD  CVS- RRR, no murmur RESP-CTAB EXT- Severe edema from knees to top of feet  weeping from Left leg, venous stasis dermatitis bilat shins, peeling skin on feet/legs        Assessment &  Plan:      Problem List Items Addressed This Visit    Venous stasis dermatitis   Lymphedema - Primary    Worsening edema. I query how much sodium intake use had recently as he did not have severe edema like this a couple weeks ago when I saw him. At this point he now has weeping lesions. His oral diuretics are not helping. He is not having any cardiopulmonary symptoms. We placed Unna boots on his legs here in the office. We will get home health nursing to assist with him at home with his wraps as he cannot afford the lymphedema clinic and he does have the supplies at home needed to treat the lymphedema.         Note: This dictation was prepared with Dragon dictation along with smaller phrase technology. Any transcriptional errors that result from this process are unintentional.

## 2015-09-29 NOTE — Patient Instructions (Addendum)
[facility] to be set up for unna boot removal  Elevate legs  Continue current medications F/U as previous

## 2015-09-29 NOTE — Assessment & Plan Note (Signed)
Worsening edema. I query how much sodium intake use had recently as he did not have severe edema like this a couple weeks ago when I saw him. At this point he now has weeping lesions. His oral diuretics are not helping. He is not having any cardiopulmonary symptoms. We placed Unna boots on his legs here in the office. We will get home health nursing to assist with him at home with his wraps as he cannot afford the lymphedema clinic and he does have the supplies at home needed to treat the lymphedema.

## 2015-10-03 ENCOUNTER — Encounter (HOSPITAL_COMMUNITY): Payer: Medicare Other | Admitting: Physical Therapy

## 2015-10-04 ENCOUNTER — Encounter (HOSPITAL_COMMUNITY): Payer: Medicare Other | Admitting: Physical Therapy

## 2015-10-04 ENCOUNTER — Telehealth: Payer: Self-pay | Admitting: Family Medicine

## 2015-10-04 NOTE — Telephone Encounter (Signed)
Get another [facility] agency then, he has Unna boots on needs a nurse

## 2015-10-04 NOTE — Telephone Encounter (Signed)
Sent message to [facility] to see if can take in pt for removal of una boot

## 2015-10-04 NOTE — Telephone Encounter (Signed)
[facility] calling to discuss this patient's referral  [phone]

## 2015-10-04 NOTE — Telephone Encounter (Signed)
Called Juliann Pulse back from [facility] and stated they are down 3 nurses and can not see pt for [facility] nursing assistant at this time. States can do any other service except HH. Please advise!

## 2015-10-05 ENCOUNTER — Inpatient Hospital Stay (HOSPITAL_COMMUNITY)
Admission: EM | Admit: 2015-10-05 | Discharge: 2015-10-09 | DRG: 292 | Disposition: A | Discharge status: Skilled Nursing Facility | Payer: Medicare Other | Attending: Family Medicine | Admitting: Family Medicine

## 2015-10-05 ENCOUNTER — Emergency Department (HOSPITAL_COMMUNITY): Payer: Medicare Other

## 2015-10-05 ENCOUNTER — Encounter (HOSPITAL_COMMUNITY): Payer: Medicare Other | Admitting: Physical Therapy

## 2015-10-05 ENCOUNTER — Encounter (HOSPITAL_COMMUNITY): Payer: Self-pay

## 2015-10-05 DIAGNOSIS — R2240 Localized swelling, mass and lump, unspecified lower limb: Secondary | ICD-10-CM | POA: Diagnosis not present

## 2015-10-05 DIAGNOSIS — I890 Lymphedema, not elsewhere classified: Secondary | ICD-10-CM | POA: Diagnosis not present

## 2015-10-05 DIAGNOSIS — L89312 Pressure ulcer of right buttock, stage 2: Secondary | ICD-10-CM | POA: Diagnosis not present

## 2015-10-05 DIAGNOSIS — R160 Hepatomegaly, not elsewhere classified: Secondary | ICD-10-CM | POA: Diagnosis not present

## 2015-10-05 DIAGNOSIS — Z7982 Long term (current) use of aspirin: Secondary | ICD-10-CM | POA: Diagnosis not present

## 2015-10-05 DIAGNOSIS — E785 Hyperlipidemia, unspecified: Secondary | ICD-10-CM | POA: Diagnosis present

## 2015-10-05 DIAGNOSIS — Z9119 Patient's noncompliance with other medical treatment and regimen: Secondary | ICD-10-CM | POA: Diagnosis not present

## 2015-10-05 DIAGNOSIS — J449 Chronic obstructive pulmonary disease, unspecified: Secondary | ICD-10-CM | POA: Diagnosis present

## 2015-10-05 DIAGNOSIS — M5137 Other intervertebral disc degeneration, lumbosacral region: Secondary | ICD-10-CM | POA: Diagnosis not present

## 2015-10-05 DIAGNOSIS — R609 Edema, unspecified: Secondary | ICD-10-CM

## 2015-10-05 DIAGNOSIS — R600 Localized edema: Secondary | ICD-10-CM

## 2015-10-05 DIAGNOSIS — E876 Hypokalemia: Secondary | ICD-10-CM | POA: Diagnosis present

## 2015-10-05 DIAGNOSIS — E1149 Type 2 diabetes mellitus with other diabetic neurological complication: Secondary | ICD-10-CM | POA: Diagnosis present

## 2015-10-05 DIAGNOSIS — M4806 Spinal stenosis, lumbar region: Secondary | ICD-10-CM | POA: Diagnosis not present

## 2015-10-05 DIAGNOSIS — Z794 Long term (current) use of insulin: Secondary | ICD-10-CM | POA: Diagnosis not present

## 2015-10-05 DIAGNOSIS — R601 Generalized edema: Secondary | ICD-10-CM | POA: Diagnosis not present

## 2015-10-05 DIAGNOSIS — K7581 Nonalcoholic steatohepatitis (NASH): Secondary | ICD-10-CM | POA: Diagnosis present

## 2015-10-05 DIAGNOSIS — R0602 Shortness of breath: Secondary | ICD-10-CM | POA: Diagnosis not present

## 2015-10-05 DIAGNOSIS — T502X5A Adverse effect of carbonic-anhydrase inhibitors, benzothiadiazides and other diuretics, initial encounter: Secondary | ICD-10-CM | POA: Diagnosis not present

## 2015-10-05 DIAGNOSIS — M1712 Unilateral primary osteoarthritis, left knee: Secondary | ICD-10-CM | POA: Diagnosis not present

## 2015-10-05 DIAGNOSIS — R278 Other lack of coordination: Secondary | ICD-10-CM | POA: Diagnosis not present

## 2015-10-05 DIAGNOSIS — R001 Bradycardia, unspecified: Secondary | ICD-10-CM | POA: Diagnosis present

## 2015-10-05 DIAGNOSIS — I5033 Acute on chronic diastolic (congestive) heart failure: Secondary | ICD-10-CM | POA: Diagnosis not present

## 2015-10-05 DIAGNOSIS — M6281 Muscle weakness (generalized): Secondary | ICD-10-CM | POA: Diagnosis not present

## 2015-10-05 DIAGNOSIS — Z6841 Body Mass Index (BMI) 40.0 and over, adult: Secondary | ICD-10-CM

## 2015-10-05 DIAGNOSIS — K219 Gastro-esophageal reflux disease without esophagitis: Secondary | ICD-10-CM | POA: Diagnosis present

## 2015-10-05 DIAGNOSIS — E039 Hypothyroidism, unspecified: Secondary | ICD-10-CM | POA: Diagnosis not present

## 2015-10-05 DIAGNOSIS — Z9889 Other specified postprocedural states: Secondary | ICD-10-CM

## 2015-10-05 DIAGNOSIS — R0600 Dyspnea, unspecified: Secondary | ICD-10-CM | POA: Diagnosis not present

## 2015-10-05 DIAGNOSIS — I10 Essential (primary) hypertension: Secondary | ICD-10-CM | POA: Diagnosis present

## 2015-10-05 DIAGNOSIS — L89152 Pressure ulcer of sacral region, stage 2: Secondary | ICD-10-CM | POA: Diagnosis not present

## 2015-10-05 DIAGNOSIS — R262 Difficulty in walking, not elsewhere classified: Secondary | ICD-10-CM | POA: Diagnosis not present

## 2015-10-05 DIAGNOSIS — D509 Iron deficiency anemia, unspecified: Secondary | ICD-10-CM | POA: Diagnosis not present

## 2015-10-05 DIAGNOSIS — K740 Hepatic fibrosis: Secondary | ICD-10-CM | POA: Diagnosis not present

## 2015-10-05 DIAGNOSIS — R069 Unspecified abnormalities of breathing: Secondary | ICD-10-CM | POA: Diagnosis not present

## 2015-10-05 DIAGNOSIS — G629 Polyneuropathy, unspecified: Secondary | ICD-10-CM | POA: Diagnosis not present

## 2015-10-05 DIAGNOSIS — R488 Other symbolic dysfunctions: Secondary | ICD-10-CM | POA: Diagnosis not present

## 2015-10-05 HISTORY — DX: Edema, unspecified: R60.9

## 2015-10-05 HISTORY — DX: Localized edema: R60.0

## 2015-10-05 LAB — COMPREHENSIVE METABOLIC PANEL
ALK PHOS: 66 U/L (ref 38–126)
ALT: 34 U/L (ref 17–63)
ANION GAP: 10 (ref 5–15)
AST: 44 U/L — ABNORMAL HIGH (ref 15–41)
Albumin: 4.1 g/dL (ref 3.5–5.0)
BILIRUBIN TOTAL: 0.4 mg/dL (ref 0.3–1.2)
BUN: 10 mg/dL (ref 6–20)
CALCIUM: 9.3 mg/dL (ref 8.9–10.3)
CO2: 27 mmol/L (ref 22–32)
Chloride: 101 mmol/L (ref 101–111)
Creatinine, Ser: 0.56 mg/dL — ABNORMAL LOW (ref 0.61–1.24)
GFR calc Af Amer: >60 mL/min (ref >60)
Glucose, Bld: 152 mg/dL — ABNORMAL HIGH (ref 65–99)
POTASSIUM: 3.8 mmol/L (ref 3.5–5.1)
Sodium: 138 mmol/L (ref 135–145)
TOTAL PROTEIN: 6.8 g/dL (ref 6.5–8.1)

## 2015-10-05 LAB — CBC WITH DIFFERENTIAL/PLATELET
Basophils Absolute: 0.1 10*3/uL (ref 0.0–0.1)
Basophils Relative: 2 %
Eosinophils Absolute: 0.1 10*3/uL (ref 0.0–0.7)
Eosinophils Relative: 1 %
HEMATOCRIT: 40.5 % (ref 39.0–52.0)
Hemoglobin: 13.7 g/dL (ref 13.0–17.0)
LYMPHS PCT: 28 %
Lymphs Abs: 1.5 10*3/uL (ref 0.7–4.0)
MCH: 28.1 pg (ref 26.0–34.0)
MCHC: 33.8 g/dL (ref 30.0–36.0)
MCV: 83 fL (ref 78.0–100.0)
MONO ABS: 0.4 10*3/uL (ref 0.1–1.0)
MONOS PCT: 8 %
NEUTROS ABS: 3.3 10*3/uL (ref 1.7–7.7)
Neutrophils Relative %: 61 %
Platelets: 233 10*3/uL (ref 150–400)
RBC: 4.88 MIL/uL (ref 4.22–5.81)
RDW: 13.8 % (ref 11.5–15.5)
WBC: 5.4 10*3/uL (ref 4.0–10.5)

## 2015-10-05 LAB — URINALYSIS, ROUTINE W REFLEX MICROSCOPIC
BILIRUBIN URINE: NEGATIVE
GLUCOSE, UA: 100 mg/dL — AB
Hgb urine dipstick: NEGATIVE
Ketones, ur: NEGATIVE mg/dL
Leukocytes, UA: NEGATIVE
Nitrite: NEGATIVE
Protein, ur: NEGATIVE mg/dL
Specific Gravity, Urine: 1.01 (ref 1.005–1.030)
pH: 6.5 (ref 5.0–8.0)

## 2015-10-05 LAB — CBG MONITORING, ED: Glucose-Capillary: 132 mg/dL — ABNORMAL HIGH (ref 65–99)

## 2015-10-05 LAB — TROPONIN I

## 2015-10-05 LAB — BRAIN NATRIURETIC PEPTIDE: B NATRIURETIC PEPTIDE 5: 238 pg/mL — AB (ref 0.0–100.0)

## 2015-10-05 LAB — GLUCOSE, CAPILLARY: Glucose-Capillary: 143 mg/dL — ABNORMAL HIGH (ref 65–99)

## 2015-10-05 MED ORDER — GABAPENTIN 400 MG PO CAPS
400.0000 mg | ORAL_CAPSULE | Freq: Three times a day (TID) | ORAL | Status: DC
  Administered 2015-10-05 – 2015-10-09 (×12): 400 mg via ORAL
  Filled 2015-10-05 (×12): qty 1

## 2015-10-05 MED ORDER — ASPIRIN EC 81 MG PO TBEC
81.0000 mg | DELAYED_RELEASE_TABLET | Freq: Every day | ORAL | Status: DC
  Administered 2015-10-05 – 2015-10-09 (×5): 81 mg via ORAL
  Filled 2015-10-05 (×5): qty 1

## 2015-10-05 MED ORDER — FERROUS SULFATE 325 (65 FE) MG PO TABS
325.0000 mg | ORAL_TABLET | Freq: Every day | ORAL | Status: DC
  Administered 2015-10-05 – 2015-10-09 (×5): 325 mg via ORAL
  Filled 2015-10-05 (×5): qty 1

## 2015-10-05 MED ORDER — NEBIVOLOL HCL 10 MG PO TABS
5.0000 mg | ORAL_TABLET | Freq: Every day | ORAL | Status: DC
  Administered 2015-10-05 – 2015-10-09 (×5): 5 mg via ORAL
  Filled 2015-10-05 (×5): qty 1

## 2015-10-05 MED ORDER — METFORMIN HCL 500 MG PO TABS
1000.0000 mg | ORAL_TABLET | Freq: Two times a day (BID) | ORAL | Status: DC
  Administered 2015-10-05 – 2015-10-09 (×8): 1000 mg via ORAL
  Filled 2015-10-05 (×8): qty 2

## 2015-10-05 MED ORDER — DIAZEPAM 5 MG PO TABS: 5.0000 mg | ORAL_TABLET | Freq: Every evening | ORAL | Status: DC | PRN

## 2015-10-05 MED ORDER — MELOXICAM 7.5 MG PO TABS
15.0000 mg | ORAL_TABLET | Freq: Every day | ORAL | Status: DC
  Administered 2015-10-06 – 2015-10-09 (×4): 15 mg via ORAL
  Filled 2015-10-05 (×7): qty 2

## 2015-10-05 MED ORDER — MORPHINE SULFATE (PF) 4 MG/ML IV SOLN
4.0000 mg | Freq: Once | INTRAVENOUS | Status: AC
  Administered 2015-10-05: 4 mg via INTRAVENOUS
  Filled 2015-10-05: qty 1

## 2015-10-05 MED ORDER — OXYCODONE-ACETAMINOPHEN 7.5-325 MG PO TABS
1.0000 | ORAL_TABLET | Freq: Four times a day (QID) | ORAL | Status: DC | PRN
  Administered 2015-10-05 – 2015-10-09 (×10): 1 via ORAL
  Filled 2015-10-05 (×11): qty 1

## 2015-10-05 MED ORDER — POTASSIUM CHLORIDE CRYS ER 10 MEQ PO TBCR
40.0000 meq | EXTENDED_RELEASE_TABLET | Freq: Two times a day (BID) | ORAL | Status: DC
  Administered 2015-10-05 – 2015-10-09 (×8): 40 meq via ORAL
  Filled 2015-10-05 (×10): qty 4

## 2015-10-05 MED ORDER — LEVOTHYROXINE SODIUM 50 MCG PO TABS
50.0000 ug | ORAL_TABLET | Freq: Every day | ORAL | Status: DC
  Administered 2015-10-06 – 2015-10-07 (×2): 50 ug via ORAL
  Filled 2015-10-05 (×2): qty 1

## 2015-10-05 MED ORDER — SODIUM CHLORIDE 0.9 % IJ SOLN
3.0000 mL | Freq: Two times a day (BID) | INTRAMUSCULAR | Status: DC
  Administered 2015-10-06 – 2015-10-08 (×4): 3 mL via INTRAVENOUS

## 2015-10-05 MED ORDER — HEPARIN SODIUM (PORCINE) 5000 UNIT/ML IJ SOLN
5000.0000 [IU] | Freq: Three times a day (TID) | INTRAMUSCULAR | Status: DC
  Administered 2015-10-05 – 2015-10-08 (×10): 5000 [IU] via SUBCUTANEOUS
  Filled 2015-10-05 (×11): qty 1

## 2015-10-05 MED ORDER — BENAZEPRIL HCL 10 MG PO TABS
40.0000 mg | ORAL_TABLET | Freq: Every day | ORAL | Status: DC
  Administered 2015-10-05 – 2015-10-09 (×5): 40 mg via ORAL
  Filled 2015-10-05 (×5): qty 4

## 2015-10-05 MED ORDER — VANCOMYCIN HCL IN DEXTROSE 1-5 GM/200ML-% IV SOLN
1000.0000 mg | Freq: Once | INTRAVENOUS | Status: AC
  Administered 2015-10-05: 1000 mg via INTRAVENOUS
  Filled 2015-10-05: qty 200

## 2015-10-05 MED ORDER — FUROSEMIDE 10 MG/ML IJ SOLN
80.0000 mg | Freq: Three times a day (TID) | INTRAMUSCULAR | Status: DC
  Administered 2015-10-05 – 2015-10-07 (×6): 80 mg via INTRAVENOUS
  Filled 2015-10-05 (×6): qty 8

## 2015-10-05 MED ORDER — FUROSEMIDE 10 MG/ML IJ SOLN
120.0000 mg | Freq: Once | INTRAVENOUS | Status: AC
  Administered 2015-10-05: 120 mg via INTRAVENOUS
  Filled 2015-10-05: qty 12

## 2015-10-05 MED ORDER — INSULIN GLARGINE 100 UNIT/ML ~~LOC~~ SOLN
30.0000 [IU] | Freq: Every day | SUBCUTANEOUS | Status: DC
  Administered 2015-10-05 – 2015-10-08 (×4): 30 [IU] via SUBCUTANEOUS
  Filled 2015-10-05 (×5): qty 0.3

## 2015-10-05 MED ORDER — FUROSEMIDE 10 MG/ML IJ SOLN
40.0000 mg | Freq: Once | INTRAMUSCULAR | Status: AC
  Administered 2015-10-05: 40 mg via INTRAVENOUS
  Filled 2015-10-05: qty 4

## 2015-10-05 MED ORDER — MAGNESIUM OXIDE 400 (241.3 MG) MG PO TABS
400.0000 mg | ORAL_TABLET | Freq: Two times a day (BID) | ORAL | Status: DC
  Administered 2015-10-05 – 2015-10-09 (×8): 400 mg via ORAL
  Filled 2015-10-05 (×8): qty 1

## 2015-10-05 NOTE — ED Notes (Signed)
Echo at bedside

## 2015-10-05 NOTE — ED Notes (Signed)
Pt unable to walk at this time. Dr.Rancour notified.

## 2015-10-05 NOTE — ED Provider Notes (Signed)
CSN: [id number]     Arrival date & time 10/05/15  1102 History  By signing my name below, I, Eustaquio Maize, attest that this documentation has been prepared under the direction and in the presence of Ezequiel Essex, MD. Electronically Signed: Eustaquio Maize, ED Scribe. 10/05/2015. 12:36 PM.   Chief Complaint  Patient presents with  . Shortness of Breath  . Leg Swelling   The history is provided by the patient. No language interpreter was used.     HPI Comments: Michael [name] is a 70 y.o. male brought in by ambulance, with hx COPD, DM, HLD, essential HTN, and diastolic dysfunction who presents to the Emergency Department complaining of gradual onset, constant, bilateral leg swelling x 6 months, gradually worsening. He reports bilateral leg pain as well as drainage to the legs that began 3 weeks ago. Pt called the ambulance today due to worsening pain in his legs. He also complains of mild shortness of breath that began today. Pt states he has been complaint with his Lasix. He was seen by his PCP, Dr. Buelah Manis 1 week ago for same symptoms. She advised that pt get a home health nurse to help aid pt in walking and applying dressings since pt cannot afford the lymphedema clinic. Denies cough, chest pain, abdominal pain, nausea, vomiting, fever, loss of appetite, dysuria, testicular pain, or any other associated symptoms. He has never had to come into the hospital for lymphedema in the past. Pt does not walk at home. The last time pt walked was months ago. No hx MI or stenting.   Past Medical History  Diagnosis Date  . COPD (chronic obstructive pulmonary disease) ([place])   . Type 2 diabetes mellitus ([place])   . Hyperlipidemia   . Essential hypertension   . Iron deficiency anemia   . NASH (nonalcoholic steatohepatitis)   . Diabetic neuropathy ([place])   . GERD (gastroesophageal reflux disease)   . Arthritis   . Depression   . Diastolic dysfunction   . Left knee DJD   . Lumbar spinal stenosis   . DDD  (degenerative disc disease), lumbosacral   . Peripheral edema    Past Surgical History  Procedure Laterality Date  . Hernia repair    . Shoulder surgery      Rght-rotator cuff  . Lipoma removal      Stomach  . Knee arthroscopy with medial menisectomy Left 11/06/2012    Procedure: KNEE ARTHROSCOPY WITH MEDIAL MENISECTOMY;  Surgeon: Carole Civil, MD;  Location: [facility];  Service: Orthopedics;  Laterality: Left;  . Lumbar laminectomy/decompression microdiscectomy Left 08/30/2013    Procedure: LUMBAR LAMINECTOMY/DECOMPRESSION MICRODISCECTOMY LEFT  LUMBAR TWO THREE;  Surgeon: Otilio Connors, MD;  Location: [facility];  Service: Neurosurgery;  Laterality: Left;  . Egd with enteroscopy  2011    [facility]: normal esophagus and stomach. Normal duodenum, jejunum. No evidence of AVMs.   . Colonoscopy  2011    [facility]: normal colon, normal distal ileum  . Colonoscopy with propofol N/A 07/21/2014    Procedure: ATTEMPTED COLONOSCOPY WITH PROPOFOL-HAD TO STOP DUE TO BRADYCARDIA;  Surgeon: Daneil Dolin, MD;  Location: [facility];  Service: Endoscopy;  Laterality: N/A;  . Esophagogastroduodenoscopy (egd) with propofol N/A 07/21/2014    Procedure: ESOPHAGOGASTRODUODENOSCOPY (EGD) WITH PROPOFOL;  Surgeon: Daneil Dolin, MD;  Location: [facility];  Service: Endoscopy;  Laterality: N/A;  . Esophageal biopsy N/A 07/21/2014    Procedure: BIOPSY;  Surgeon: Daneil Dolin, MD;  Location: [facility]  ORS;  Service: Endoscopy;  Laterality: N/A;   Family History  Problem Relation Age of Onset  . Heart disease Mother   . Hyperlipidemia Mother   . Hypertension Mother   . Depression Mother   . Diabetes Mother   . Rectal cancer Mother   . Cancer Mother   . Heart disease Father   . Hypertension Father   . Hyperlipidemia Father   . Diabetes Father   . Cancer Father   . Heart disease Sister   . Hyperlipidemia Sister   . Hypertension Sister   . Diabetes Sister   . Diabetes Brother   . Heart disease Sister   .  Hyperlipidemia Sister   . Hypertension Sister   . Heart disease Sister   . Hyperlipidemia Sister   . Hypertension Sister   . Diabetes Sister   . Diabetes Brother    Social History  Substance Use Topics  . Smoking status: Former Smoker -- 2.50 packs/day for 44 years    Types: Cigarettes    Quit date: 10/01/1999  . Smokeless tobacco: Former Systems developer    Quit date: 10/01/1999  . Alcohol Use: No    Review of Systems  A complete 10 system review of systems was obtained and all systems are negative except as noted in the HPI and PMH.   Allergies  Review of patient's allergies indicates no known allergies.  Home Medications   Prior to Admission medications   Medication Sig Start Date End Date Taking? Authorizing Provider  ACCU-CHEK AVIVA PLUS test strip  04/10/15   Historical Provider, MD  aspirin EC 81 MG tablet Take 81 mg by mouth daily.    Historical Provider, MD  B-D INS SYRINGE 0.5CC/31GX5/16 31G X 5/16" 0.5 ML MISC  07/07/14   Historical Provider, MD  benazepril (LOTENSIN) 40 MG tablet TAKE ONE TABLET BY MOUTH DAILY. 04/11/15   [name], MD  bumetanide (BUMEX) 2 MG tablet Take 1 tablet (2 mg total) by mouth 2 (two) times daily. 06/12/15   [name], MD  BYSTOLIC 10 MG tablet TAKE ONE TABLET BY MOUTH DAILY. 08/02/15   [name], MD  clobetasol cream (TEMOVATE) 0.05 % APPLY TO AFFECTED AREA 2 TIMES DAILY. 06/06/15   [name], MD  diazepam (VALIUM) 5 MG tablet Take 1 tablet (5 mg total) by mouth at bedtime as needed for anxiety. 09/08/15   [name], MD  diclofenac sodium (VOLTAREN) 1 % GEL Apply 4 g topically 4 (four) times daily. 08/31/14   Carole Civil, MD  Ferrous Sulfate (IRON) 325 (65 FE) MG TABS Take 1 tablet by mouth daily.     Historical Provider, MD  gabapentin (NEURONTIN) 400 MG capsule TAKE ONE CAPSULE BY MOUTH THREE TIMES DAILY. 09/06/15   [name], MD  glipiZIDE (GLUCOTROL) 10 MG tablet TAKE ONE TABLET BY MOUTH TWICE DAILY BEFORE  MEALS. 04/11/15   [name], MD  Insulin Glargine (LANTUS) 100 UNIT/ML Solostar Pen Inject 30 Units into the skin at bedtime. 10/12/14   [name], MD  levothyroxine (SYNTHROID, LEVOTHROID) 50 MCG tablet Take 1 tablet (50 mcg total) by mouth daily before breakfast. 05/03/15   [name], MD  Magnesium 400 MG TABS Take 400 mg by mouth 2 (two) times daily. 05/31/15   Lendon Colonel, NP  meloxicam (MOBIC) 15 MG tablet Take 15 mg by mouth daily.  04/05/14   Historical Provider, MD  metFORMIN (GLUCOPHAGE) 1000 MG tablet TAKE ONE  TABLET BY MOUTH TWICE DAILY WITH A MEAL. 09/06/15   [name], MD  metolazone (ZAROXOLYN) 2.5 MG tablet TAKE 1 TAB WEEKLY AS NEEDED FOR SEVERE SWELLING/WEIGHT GAIN 06/12/15   [name], MD  nystatin cream (MYCOSTATIN) Apply 1 application topically 2 (two) times daily. 12/23/14   [name], MD  omeprazole (PRILOSEC) 20 MG capsule TAKE ONE CAPSULE BY MOUTH TWICE DAILY. 03/13/15   [name], MD  oxyCODONE-acetaminophen (PERCOCET) 7.5-325 MG tablet Take 1 tablet by mouth every 6 (six) hours as needed for severe pain. 09/29/15   [name], MD  potassium chloride (K-DUR) 10 MEQ tablet Take 2 tablets in the AM / Take 1 tablet in the PM 05/08/15   Lendon Colonel, NP  pravastatin (PRAVACHOL) 40 MG tablet Take 1 tablet (40 mg total) by mouth every evening. 04/17/15   [name], MD   Triage Vitals:  BP 166/62 mmHg  Pulse 89  Temp(Src) 98.7 F (37.1 C) (Oral)  Resp 20  Ht 5' (1.524 m)  Wt 212 lb (96.163 kg)  BMI 41.40 kg/m2  SpO2 98%   Physical Exam  Constitutional: He is oriented to person, place, and time. He appears well-developed and well-nourished. No distress.  Debilitated. Unable to hold himself up or stand.   HENT:  Head: Normocephalic and atraumatic.  Mouth/Throat: Oropharynx is clear and moist. No oropharyngeal exudate.  Eyes: Conjunctivae and EOM are normal. Pupils are equal, round, and reactive to light.   Neck: Normal range of motion. Neck supple.  No meningismus.  Cardiovascular: Normal rate, regular rhythm, normal heart sounds and intact distal pulses.   No murmur heard. Pulmonary/Chest: Effort normal and breath sounds normal. No respiratory distress. He has no wheezes. He has no rales.  Abdominal: Soft. There is no tenderness. There is no rebound and no guarding.  Musculoskeletal: Normal range of motion. He exhibits edema and tenderness.  +4 pitting edema to bilateral knees with streaking erythema Intact DP pulses by doppler 3 cm clean ulceration to the left posterior calf  Neurological: He is alert and oriented to person, place, and time. No cranial nerve deficit. He exhibits normal muscle tone. Coordination normal.  No ataxia on finger to nose bilaterally. No pronator drift. 5/5 strength throughout. CN 2-12 intact.Equal grip strength. Sensation intact.   Skin: Skin is warm. Rash noted. There is erythema.  Psychiatric: He has a normal mood and affect. His behavior is normal.  Nursing note and vitals reviewed.   ED Course  Procedures (including critical care time)  DIAGNOSTIC STUDIES: Oxygen Saturation is 98% on RA, normal by my interpretation.    COORDINATION OF CARE: 12:28 PM-Discussed treatment plan which includes CXR, BNP, UA, CBC, CMP, Troponin, and CBG with pt at bedside and pt agreed to plan.   Labs Review Labs Reviewed  COMPREHENSIVE METABOLIC PANEL - Abnormal; Notable for the following:    Glucose, Bld 152 (*)    Creatinine, Ser 0.56 (*)    AST 44 (*)    All other components within normal limits  BRAIN NATRIURETIC PEPTIDE - Abnormal; Notable for the following:    B Natriuretic Peptide 238.0 (*)    All other components within normal limits  URINALYSIS, ROUTINE W REFLEX MICROSCOPIC (NOT AT [facility]) - Abnormal; Notable for the following:    Glucose, UA 100 (*)    All other components within normal limits  CBG MONITORING, ED - Abnormal; Notable for the following:     Glucose-Capillary 132 (*)  All other components within normal limits  CBC WITH DIFFERENTIAL/PLATELET  TROPONIN I  BASIC METABOLIC PANEL  CBC  PROTIME-INR    Imaging Review Dg Chest 2 View  10/05/2015  CLINICAL DATA:  Lower extremity swelling the past 2-3 months. Shortness of breath yesterday, worse with exertion. Intermittent chest pain. History lymphoma. EXAM: CHEST  2 VIEW COMPARISON:  05/02/2013 FINDINGS: Interval apparent enlargement of the cardiac silhouette, potentially accentuated due to decreased lung volumes and AP projection. Evaluation the retrosternal clear space obscured secondary overlying soft tissues. Grossly unchanged mediastinal contours given decreased lung volumes. Worsening bibasilar opacities, right greater left, favored to represent atelectasis. No discrete focal airspace opacities. No pleural effusion or pneumothorax. No evidence of edema. No acute osseous abnormalities. IMPRESSION: 1. Apparent enlargement of the cardiac silhouette, potentially accentuated due to AP projection and decreased lung volumes though worsening cardiomegaly and/or a development of a pericardial effusion could result in a similar appearance. Clinical correlation is advised. Further evaluation cardiac echo could be performed as clinically indicated. No definite evidence of edema. 2. Decreased lung volumes with worsening bibasilar opacities, likely atelectasis. Electronically Signed   By: Sandi Mariscal M.D.   On: 10/05/2015 13:11   I have personally reviewed and evaluated these images and lab results as part of my medical decision-making.   EKG Interpretation   Date/Time:  Thursday October 05 2015 12:34:11 EST Ventricular Rate:  83 PR Interval:    QRS Duration: 79 QT Interval:  397 QTC Calculation: 466 R Axis:   121 Text Interpretation:  Sinus rhythm Ventricular premature complex Anterior  infarct, old Borderline T abnormalities, inferior leads Baseline wander in  lead(s) V2 V3 Nonspecific ST  abnormality Confirmed by [place]  [phone]) on 10/05/2015 12:43:41 PM      MDM   Final diagnoses:  Peripheral edema  Lymphedema   Progressively worsening leg swelling, redness, drainage for the past several months. Some shortness of breath with exertion. Not ambulatory. History of lymphedema. No fever, cellulitis less likely.   Echocardiogram in August showed normal EF. Lungs are clear. No hypoxia.   CXR with enlargement of cardiac shadow.  No obvious effusion on bedside US. No tamponade.  Formal echocardiogram will be ordered. Creatinine is at baseline. Patient will be given IV Lasix. He takes Bumex at home. He is unable to walk.  D/w Dr. Verlon Au.  He requests 160 mg of lasix and will evaluate patient.     EMERGENCY DEPARTMENT [place] CARDIAC EXAM "Study: Limited Ultrasound of the heart and pericardium"  INDICATIONS:Dyspnea Multiple views of the heart and pericardium are obtained with a multi-frequency probe.  PERFORMED XK:[id number]  IMAGES ARCHIVED?: Yes  FINDINGS: No pericardial effusion, Normal contractility and Tamponade physiology absent  LIMITATIONS:  Body habitus and Emergent procedure  VIEWS USED: Subcostal 4 chamber and Apical 4 chamber   INTERPRETATION: Cardiac activity present, Pericardial effusioin absent, Cardiac tamponade absent and Normal contractility  COMMENT:  Poor images but no obvious pericardial effusion    I personally performed the services described in this documentation, which was scribed in my presence. The recorded information has been reviewed and is accurate.    Ezequiel Essex, MD 10/05/15 2158

## 2015-10-05 NOTE — ED Notes (Signed)
Pt reports bilateral swelling, redness, and discoloration to lower legs for the past 2 or 3 months.  Reports sob since yesterday, worse with exertion.     Reports intermittent chest pain.  Last episode was this morning.    No chest pain at present time.

## 2015-10-05 NOTE — ED Notes (Signed)
Attempted report x1. 

## 2015-10-05 NOTE — H&P (Signed)
[facility] History and Physical  Michael [name] FYB:[id number] DOB: 09-11-1946 DOA: 10/05/2015  Referring physician: ed PCP: Vic Blackbird, MD  Specialists: none  70 y/o ? Chr Diastolic HF-lowest recorded weight 201 lbs EF 05/2015 EF60-65% grad II bradycardia Non-compliant on salt restriction DM ty II Body mass index is 41.4 kg/(m^2). Htn Nsaid induced Gastritis on EGD 07/21/14 Iron deficiency anemia on Feraheme replacement in the past NASH + Hepatomegally Lumbar degen ddd s/p surgery l2-l3 Iron def anemia   Seen at primary care physician office 12/30 noted to be grossly overweight, fluid overloaded with predominant swelling in lower extremities NOTE that patient cannot afford lymphedema clinic and was wrapped with as he also had excoriated wound on the leg as well.  Tells me after last visit with physician at vascular surgery was cautioned extensively regarding diuretics and cautioned that he may be on "too much". I've no way to confirm this information but from what he tells me he has not taken his metolazone regularly and has only been taking his Bumex on a when necessary basis  Has been basically nonambulatory for the past week we can half where he prior used use a walker he now uses a wheelchair to get around He severely deconditioned and also has chronic lumbar degenerative disc disease making movement difficult and also has prior knee surgery  He states that he had some mild chest pain over the past 1-2 days but nothing that lasted for a while nor with any worrisome radiation or crescendo decrescendo character  No nausea no vomiting No dark stool No tarry stool No current chest pain No dysphagia No overt shortness of breath No cough No dysuria No unilateral weakness No seizures No rhinorrhea No orthopnea and no DOE outside of the ordinary Past regular stool today and no other bowel issues   On admission noted BNP 238 BUN/creatinine 10/0.5 AST 44/ALT  34 Hemoglobin 13.7 Urine showed 100 glucose negative protein Chest x-ray showed enlargement of cardiac silhouette with cardiomegaly + /- pericardial effusion? With no signs of overall edema EKG showed sinus rhythm poor baseline, PR interval 0.20, QRS axis abnormal and probably poor lead placement, No significant ST-T wave changes   Past Medical History  Diagnosis Date  . COPD (chronic obstructive pulmonary disease) ([place])   . Type 2 diabetes mellitus ([place])   . Hyperlipidemia   . Essential hypertension   . Iron deficiency anemia   . NASH (nonalcoholic steatohepatitis)   . Diabetic neuropathy ([place])   . GERD (gastroesophageal reflux disease)   . Arthritis   . Depression   . Diastolic dysfunction   . Left knee DJD   . Lumbar spinal stenosis   . DDD (degenerative disc disease), lumbosacral   . Peripheral edema    Past Surgical History  Procedure Laterality Date  . Hernia repair    . Shoulder surgery      Rght-rotator cuff  . Lipoma removal      Stomach  . Knee arthroscopy with medial menisectomy Left 11/06/2012    Procedure: KNEE ARTHROSCOPY WITH MEDIAL MENISECTOMY;  Surgeon: Carole Civil, MD;  Location: [facility];  Service: Orthopedics;  Laterality: Left;  . Lumbar laminectomy/decompression microdiscectomy Left 08/30/2013    Procedure: LUMBAR LAMINECTOMY/DECOMPRESSION MICRODISCECTOMY LEFT  LUMBAR TWO THREE;  Surgeon: Otilio Connors, MD;  Location: [facility];  Service: Neurosurgery;  Laterality: Left;  . Egd with enteroscopy  2011    [facility]: normal esophagus and stomach. Normal duodenum, jejunum. No evidence of  AVMs.   . Colonoscopy  2011    [facility]: normal colon, normal distal ileum  . Colonoscopy with propofol N/A 07/21/2014    Procedure: ATTEMPTED COLONOSCOPY WITH PROPOFOL-HAD TO STOP DUE TO BRADYCARDIA;  Surgeon: Daneil Dolin, MD;  Location: [facility];  Service: Endoscopy;  Laterality: N/A;  . Esophagogastroduodenoscopy (egd) with propofol N/A 07/21/2014    Procedure:  ESOPHAGOGASTRODUODENOSCOPY (EGD) WITH PROPOFOL;  Surgeon: Daneil Dolin, MD;  Location: [facility];  Service: Endoscopy;  Laterality: N/A;  . Esophageal biopsy N/A 07/21/2014    Procedure: BIOPSY;  Surgeon: Daneil Dolin, MD;  Location: [facility];  Service: Endoscopy;  Laterality: N/A;   Social History:  Social History   Social History Narrative    No Known Allergies  Family History  Problem Relation Age of Onset  . Heart disease Mother   . Hyperlipidemia Mother   . Hypertension Mother   . Depression Mother   . Diabetes Mother   . Rectal cancer Mother   . Cancer Mother   . Heart disease Father   . Hypertension Father   . Hyperlipidemia Father   . Diabetes Father   . Cancer Father   . Heart disease Sister   . Hyperlipidemia Sister   . Hypertension Sister   . Diabetes Sister   . Diabetes Brother   . Heart disease Sister   . Hyperlipidemia Sister   . Hypertension Sister   . Heart disease Sister   . Hyperlipidemia Sister   . Hypertension Sister   . Diabetes Sister   . Diabetes Brother     Prior to Admission medications   Medication Sig Start Date End Date Taking? Authorizing Provider  ACCU-CHEK AVIVA PLUS test strip  04/10/15   Historical Provider, MD  aspirin EC 81 MG tablet Take 81 mg by mouth daily.    Historical Provider, MD  B-D INS SYRINGE 0.5CC/31GX5/16 31G X 5/16" 0.5 ML MISC  07/07/14   Historical Provider, MD  benazepril (LOTENSIN) 40 MG tablet TAKE ONE TABLET BY MOUTH DAILY. 04/11/15   [name], MD  bumetanide (BUMEX) 2 MG tablet Take 1 tablet (2 mg total) by mouth 2 (two) times daily. 06/12/15   [name], MD  BYSTOLIC 10 MG tablet TAKE ONE TABLET BY MOUTH DAILY. 08/02/15   [name], MD  clobetasol cream (TEMOVATE) 0.05 % APPLY TO AFFECTED AREA 2 TIMES DAILY. 06/06/15   [name], MD  diazepam (VALIUM) 5 MG tablet Take 1 tablet (5 mg total) by mouth at bedtime as needed for anxiety. 09/08/15   [name], MD  diclofenac sodium  (VOLTAREN) 1 % GEL Apply 4 g topically 4 (four) times daily. 08/31/14   Carole Civil, MD  Ferrous Sulfate (IRON) 325 (65 FE) MG TABS Take 1 tablet by mouth daily.     Historical Provider, MD  gabapentin (NEURONTIN) 400 MG capsule TAKE ONE CAPSULE BY MOUTH THREE TIMES DAILY. 09/06/15   [name], MD  glipiZIDE (GLUCOTROL) 10 MG tablet TAKE ONE TABLET BY MOUTH TWICE DAILY BEFORE MEALS. 04/11/15   [name], MD  Insulin Glargine (LANTUS) 100 UNIT/ML Solostar Pen Inject 30 Units into the skin at bedtime. 10/12/14   [name], MD  levothyroxine (SYNTHROID, LEVOTHROID) 50 MCG tablet Take 1 tablet (50 mcg total) by mouth daily before breakfast. 05/03/15   [name], MD  Magnesium 400 MG TABS Take 400 mg by mouth 2 (two) times daily. 05/31/15  Lendon Colonel, NP  meloxicam (MOBIC) 15 MG tablet Take 15 mg by mouth daily.  04/05/14   Historical Provider, MD  metFORMIN (GLUCOPHAGE) 1000 MG tablet TAKE ONE TABLET BY MOUTH TWICE DAILY WITH A MEAL. 09/06/15   [name], MD  metolazone (ZAROXOLYN) 2.5 MG tablet TAKE 1 TAB WEEKLY AS NEEDED FOR SEVERE SWELLING/WEIGHT GAIN 06/12/15   [name], MD  nystatin cream (MYCOSTATIN) Apply 1 application topically 2 (two) times daily. 12/23/14   [name], MD  omeprazole (PRILOSEC) 20 MG capsule TAKE ONE CAPSULE BY MOUTH TWICE DAILY. 03/13/15   [name], MD  oxyCODONE-acetaminophen (PERCOCET) 7.5-325 MG tablet Take 1 tablet by mouth every 6 (six) hours as needed for severe pain. 09/29/15   [name], MD  potassium chloride (K-DUR) 10 MEQ tablet Take 2 tablets in the AM / Take 1 tablet in the PM 05/08/15   Lendon Colonel, NP  pravastatin (PRAVACHOL) 40 MG tablet Take 1 tablet (40 mg total) by mouth every evening. 04/17/15   [name], MD   Physical Exam: Filed Vitals:   10/05/15 1330 10/05/15 1338 10/05/15 1345 10/05/15 1454  BP: 157/104 188/103  144/112  Pulse:  91 87 93  Temp:  98.8 F (37.1 C)     TempSrc:  Oral    Resp:  14 15 20   Height:      Weight:      SpO2:  98% 98% 98%     Alert pleasant oriented little bit sleepy EOMI NCAT no icterus Cannot appreciate JVD, no free S1-S2 displaced PMI no murmur rub or gallop Chest is clinically clear no added sound no rales no rhonchi Abdomen is obese slightly tender in lower quadrant, no shifting dullness present Grade 3-4 lymphedema lower extremities bilaterally both extremities are enlarged but not warm to touch and there is pitting throughout   Impression   Labs on Admission:  Basic Metabolic Panel:  Recent Labs Lab 10/05/15 1215  NA 138  K 3.8  CL 101  CO2 27  GLUCOSE 152*  BUN 10  CREATININE 0.56*  CALCIUM 9.3   Liver Function Tests:  Recent Labs Lab 10/05/15 1215  AST 44*  ALT 34  ALKPHOS 66  BILITOT 0.4  PROT 6.8  ALBUMIN 4.1   No results for input(s): LIPASE, AMYLASE in the last 168 hours. No results for input(s): AMMONIA in the last 168 hours. CBC:  Recent Labs Lab 10/05/15 1215  WBC 5.4  NEUTROABS 3.3  HGB 13.7  HCT 40.5  MCV 83.0  PLT 233   Cardiac Enzymes:  Recent Labs Lab 10/05/15 1215  TROPONINI <0.03    BNP (last 3 results)  Recent Labs  10/05/15 1215  BNP 238.0*    ProBNP (last 3 results) No results for input(s): PROBNP in the last 8760 hours.  CBG:  Recent Labs Lab 10/05/15 1231  GLUCAP 132*    Radiological Exams on Admission: Dg Chest 2 View  10/05/2015  CLINICAL DATA:  Lower extremity swelling the past 2-3 months. Shortness of breath yesterday, worse with exertion. Intermittent chest pain. History lymphoma. EXAM: CHEST  2 VIEW COMPARISON:  05/02/2013 FINDINGS: Interval apparent enlargement of the cardiac silhouette, potentially accentuated due to decreased lung volumes and AP projection. Evaluation the retrosternal clear space obscured secondary overlying soft tissues. Grossly unchanged mediastinal contours given decreased lung volumes. Worsening bibasilar  opacities, right greater left, favored to represent atelectasis. No discrete focal airspace opacities. No pleural effusion or pneumothorax.  No evidence of edema. No acute osseous abnormalities. IMPRESSION: 1. Apparent enlargement of the cardiac silhouette, potentially accentuated due to AP projection and decreased lung volumes though worsening cardiomegaly and/or a development of a pericardial effusion could result in a similar appearance. Clinical correlation is advised. Further evaluation cardiac echo could be performed as clinically indicated. No definite evidence of edema. 2. Decreased lung volumes with worsening bibasilar opacities, likely atelectasis. Electronically Signed   By: Sandi Mariscal M.D.   On: 10/05/2015 13:11    EKG: Independently reviewed.   Assessment/Plan Principal Problem:   Anasarca -Multifactorial but most likely secondary to lack of diuretic in the setting of chronic lymphedema with a history of chronic diastolic heart failure -now probably has an acute component of CHF -Given a one-time dose of 40 Lasix IV in the ED as he is Lasix tolerant and taking Bumex which is 40 times more powerful than Lasix , we will give him 160 mg of Lasix 1 and then Lasix 80 mg IV 3 times a day going forward and monitor for effect  -History chest x-ray is impressive for global enlargement of the cardiac silhouette and confluence and I suspect he does not take his medications regularly and may need simplification of the regimen  -Cut back nebivolol from 10--5 mg daily -For now hold metolazone -His weight seems to be stable and 212 range over the past couple of hospital visits   ? Pericardial effusion Bedside echo did not confirm this Formal echo pending Appreciate  ED workup performed expediently  Body mass index is 41.4 kg/(m^2).  -Some of the tissue is probably tissue fluid or lymphedema -We will need some follow-up as an outpatient to prevent the same -Continue Unna boots and change  weekly -Mobilize with therapy -Obesity is life-threatening in his case    Hypothyroidism -Continue levothyroxine 50 g daily -We'll check TSH free T4 free T3 as myxedema can present in the same way although the edema is usually a nonpitting edema    Type II diabetes mellitus with neurological manifestations (HCC) -Continue Lantus 30 units -Okay to continue cautiously metformin 1000 twice a day -Hold glipizide 10 mg daily before meals out of concern for hypoglycemia -Continue gabapentin 400 3 times a day -Start sliding scale supplementation moderate coverage    NASH (nonalcoholic steatohepatitis) -AST/ALT slightly elevated -Check INR in the morning-   DDD (degenerative disc disease), lumbosacral +  S/P arthroscopy of left knee -Continue Mobic 15 daily -Continue Percocet 7.5-3.25 every 6 when necessary pain -Mobilize with therapy    Lymphedema -See above discussion    hypokalemia and hypomagnesemia   likely secondary to diuretic use  Continue replacement and we will adjust in a.m. if needed    full CODE STATUS Admit to telemetry Expect will need to stay 3-4 days at least to get fluid down    If 7PM-7AM, please contact night-coverage [email or web address] Password [email or web address] 10/05/2015, 3:29 PM

## 2015-10-05 NOTE — ED Notes (Signed)
CBG 167 per ems.

## 2015-10-05 NOTE — ED Notes (Signed)
Pt has weeping edema to bilateral lower legs and feet.

## 2015-10-05 NOTE — ED Notes (Signed)
Dr. Wyvonnia Dusky at bedside.

## 2015-10-06 LAB — BASIC METABOLIC PANEL
Anion gap: 12 (ref 5–15)
BUN: 13 mg/dL (ref 6–20)
CO2: 32 mmol/L (ref 22–32)
Calcium: 9.6 mg/dL (ref 8.9–10.3)
Chloride: 98 mmol/L — ABNORMAL LOW (ref 101–111)
Creatinine, Ser: 0.69 mg/dL (ref 0.61–1.24)
GFR calc Af Amer: >60 mL/min (ref >60)
GFR calc non Af Amer: >60 mL/min (ref >60)
Glucose, Bld: 135 mg/dL — ABNORMAL HIGH (ref 65–99)
Potassium: 3.9 mmol/L (ref 3.5–5.1)
Sodium: 142 mmol/L (ref 135–145)

## 2015-10-06 LAB — CBC
HCT: 43.3 % (ref 39.0–52.0)
Hemoglobin: 14.5 g/dL (ref 13.0–17.0)
MCH: 28.2 pg (ref 26.0–34.0)
MCHC: 33.5 g/dL (ref 30.0–36.0)
MCV: 84.1 fL (ref 78.0–100.0)
Platelets: 246 10*3/uL (ref 150–400)
RBC: 5.15 MIL/uL (ref 4.22–5.81)
RDW: 13.8 % (ref 11.5–15.5)
WBC: 5.7 10*3/uL (ref 4.0–10.5)

## 2015-10-06 LAB — GLUCOSE, CAPILLARY: Glucose-Capillary: 138 mg/dL — ABNORMAL HIGH (ref 65–99)

## 2015-10-06 LAB — PROTIME-INR
INR: 1.11 (ref 0.00–1.49)
PROTHROMBIN TIME: 14.5 s (ref 11.6–15.2)

## 2015-10-06 NOTE — Care Management Important Message (Signed)
Important Message  Patient Details  Name: Michael [name] MRN: [id number] Date of Birth: 20-May-1946   Medicare Important Message Given:  Yes    Joylene Draft, RN 10/06/2015, 3:02 PM

## 2015-10-06 NOTE — Clinical Social Work Note (Addendum)
Clinical Social Work Assessment  Patient Details  Name: Michael [name] MRN: [id number] Date of Birth: 29-Apr-1946  Date of referral:  10/06/15               Reason for consult:  Discharge Planning                Permission sought to share information with:    Permission granted to share information::     Name::        Agency::     Relationship::     Contact Information:     Housing/Transportation Living arrangements for the past 2 months:  Apartment Source of Information:  Patient Patient Interpreter Needed:  None Criminal Activity/Legal Involvement Pertinent to Current Situation/Hospitalization:  No - Comment as needed Significant Relationships:  Other Family Members, Friend Lives with:  Self Do you feel safe going back to the place where you live?  No Need for family participation in patient care:  Yes (Comment)  Care giving concerns:  Pt admits things have become more difficult at home for him to manage.    Social Worker assessment / plan:  CSW met with pt and pt's friend, Faith at bedside with permission by pt. Pt alert and oriented and reports he lives alone. He states that his best support is Faith who provides transportation and other assistance. Pt is able to fix simple meals at home. At baseline, pt said he walks very short distances with his walker. He generally sleeps in his recliner. Pt indicates that he just got a wheelchair and has been using this for the past 5 days. PT evaluated pt and recommend SNF. CSW discussed placement process and provided SNF list. He requests [facility] only. Per MD, pt possibly ready Monday.   Employment status:  Retired Nurse, adult PT Recommendations:  [place] / Referral to community resources:  [place]  Patient/Family's Response to care:  Pt agreeable to short term SNF.   Patient/Family's Understanding of and Emotional Response to Diagnosis, Current Treatment, and  Prognosis:  Pt aware of admission diagnosis and treatment plan, with SNF as part of plan.   Emotional Assessment Appearance:  Appears stated age Attitude/Demeanor/Rapport:  Other (Cooperative) Affect (typically observed):  Appropriate Orientation:  Oriented to Self, Oriented to Place, Oriented to  Time, Oriented to Situation Alcohol / Substance use:  Not Applicable Psych involvement (Current and /or in the community):  No (Comment)  Discharge Needs  Concerns to be addressed:  Discharge Planning Concerns Readmission within the last 30 days:  No Current discharge risk:  Lives alone, Physical Impairment Barriers to Discharge:  Continued Medical Work up   Update: Notified by staff that pt requesting CSW call his niece. [name] is aware of plan and agreeable.   Salome Arnt, [place] 10/06/2015, 12:29 PM [phone]

## 2015-10-06 NOTE — Evaluation (Signed)
Physical Therapy Evaluation Patient Details Name: DEV DHONDT MRN: [id number] DOB: 10-12-1945 Today's Date: 10/06/2015   History of Present Illness  22yop white male with PMH: CHF, hypothyroidism, morbid obesity, bradycardia, lymphedema, and sacral ulcer, who comes to [facility] after ~10 days of worsening global swelling, and progressive weakness in functional mobility. At baseline, pt is able to perform short household distances with RW, but for the past 2 weeks has been limited  WC for mobiltiy, with increasingly more difficulty transfering from bed to [place].   Clinical Impression  Pt is received semirecumbent in bed upon entry, awake, alert, and willing to participate. Pain noted at BLE from swelling. Pt is A&Ox3 and pleasant. Pt strength is decreased from baseline and profoundly weak, typically able to manage transfers and short household distances, but not able to perform bed mobility at this time without max-total assistance. Pt has been unable to walk in ~10 days due to weakness, however, standing is not attempted at this time due to fluid accumulation and poor skin integrity on bilat feet. Pt describes 5/10 DOE with bed mobility in session. Patient presenting with impairment of strength, range of motion, balance, and activity tolerance, limiting ability to perform ADL and mobility tasks at  baseline level of function. Patient will benefit from skilled intervention to address the above impairments and limitations, in order to restore to prior level of function, improve patient safety upon discharge, and to decrease falls risk. Recommending STR at DC.      Follow Up Recommendations SNF    Equipment Recommendations  None recommended by PT    Recommendations for Other Services       Precautions / Restrictions Precautions Precautions: None Precaution Comments: no score available at eval Restrictions Weight Bearing Restrictions: No      Mobility  Bed Mobility Overal bed mobility: +2 for  physical assistance;Needs Assistance Bed Mobility: Supine to Sit;Sit to Supine     Supine to sit: Mod assist Sit to supine: Total assist;+2 for physical assistance   General bed mobility comments: Scooting toward [facility] is Total Assist+2  despite pt bridging.   Transfers Overall transfer level:  (Not attmpeted at this time; fagile skin integrity and swelling through feet, currntly seeping and skin tears after bed mobility. )                  Ambulation/Gait                Stairs            Wheelchair Mobility    Modified Rankin (Stroke Patients Only)       Balance Overall balance assessment: Modified Independent Sitting-balance support: No upper extremity supported;Feet supported Sitting balance-Leahy Scale: Good                                       Pertinent Vitals/Pain Pain Assessment: 0-10 Pain Score: 8  Pain Location: BLE (calves, ankles, feet)  Pain Intervention(s): Limited activity within patient's tolerance;Monitored during session;Repositioned    Home Living Family/patient expects to be discharged to:: Private residence Living Arrangements: Alone Available Help at Discharge:  (has applied for an aide before, but is not elligible. ) Type of Home: Apartment Home Access: Ramped entrance     Home Layout: One level Home Equipment: Walker - 2 wheels;Cane - quad;Cane - single point;Wheelchair - manual;Hospital bed Additional Comments: CAnnot access hopitla bed due to height (  pt reports he is 4'11". Has been sleeping in recliner x 12 months.     Prior Function Level of Independence: Independent with assistive device(s)   Gait / Transfers Assistance Needed: at baseline, walking only short household distances due to SOB. WC for mobility.   ADL's / Homemaking Assistance Needed: reports accessing shower has become increasingly difficulty.         Hand Dominance   Dominant Hand: Right    Extremity/Trunk Assessment   Upper  Extremity Assessment: Overall WFL for tasks assessed           Lower Extremity Assessment: Generalized weakness (cannot move legs in bed indep. Grossly 2+/5, but anticipated to improve some as swelling disipates. )      Cervical / Trunk Assessment:  (profound weakness in trunk. )  Communication   Communication: No difficulties  Cognition Arousal/Alertness: Awake/alert Behavior During Therapy: WFL for tasks assessed/performed Overall Cognitive Status: Within Functional Limits for tasks assessed                      General Comments      Exercises        Assessment/Plan    PT Assessment Patient needs continued PT services  PT Diagnosis Generalized weakness;Difficulty walking;Acute pain   PT Problem List Decreased strength;Decreased activity tolerance;Decreased balance;Decreased mobility;Obesity;Decreased skin integrity;Decreased knowledge of use of DME;Cardiopulmonary status limiting activity  PT Treatment Interventions Gait training;Functional mobility training;Therapeutic activities;Therapeutic exercise;Balance training;Patient/family education;Wheelchair mobility training   PT Goals (Current goals can be found in the Care Plan section) Acute Rehab PT Goals Patient Stated Goal: Regain strength and mobility.  PT Goal Formulation: With patient Time For Goal Achievement: 10/20/15 Potential to Achieve Goals: Good    Frequency Min 3X/week   Barriers to discharge Inaccessible home environment;Decreased caregiver support      Co-evaluation               End of Session   Activity Tolerance: Patient tolerated treatment well;Treatment limited secondary to medical complications (Comment) Patient left: in bed;with call bell/phone within reach (feet elevated) Nurse Communication: Other (comment)         Time: 1779-3903 PT Time Calculation (min) (ACUTE ONLY): 20 min   Charges:   PT Evaluation $PT Eval Moderate Complexity: 1 Procedure     PT G Codes:         Eletha Culbertson C 10/06/2015, 12:46 PM  12:49 PM  Etta Grandchild, PT, DPT [place] License # [id number]

## 2015-10-06 NOTE — Progress Notes (Signed)
JULIAS MOULD MWN:[id number] DOB: 09/05/1946 DOA: 10/05/2015 PCP: Vic Blackbird, MD  Brief narrative: 70 y/o ? Chr Diastolic HF-lowest recorded weight 201 lbs EF 05/2015 EF60-65% grad II bradycardia Non-compliant on salt restriction DM ty II Body mass index is 41.4 kg/(m^2). Htn Nsaid induced Gastritis on EGD 07/21/14 Iron deficiency anemia on Feraheme replacement in the past NASH + Hepatomegally Lumbar degen ddd s/p surgery l2-l3 Iron def anemia  Admitted with overt acute HF in addition to worsening lymphedema in setting  Non-compliacne bumex as OP  Past medical history-As per Problem list Chart reviewed as below-   Consultants:  none  Procedures:  none  Antibiotics:  none   Subjective  Better Some cramping in Le's.  no cp tol diet no sob   Objective    Interim History:   Telemetry: sinus   Objective: Filed Vitals:   10/05/15 2020 10/06/15 0500 10/06/15 0538 10/06/15 1328  BP: 124/66  143/74 158/73  Pulse: 88  82 86  Temp: 98.4 F (36.9 C)  98.5 F (36.9 C) 98.3 F (36.8 C)  TempSrc: Oral  Oral Oral  Resp: 20  20 20   Height:      Weight:  92.9 kg (204 lb 12.9 oz)    SpO2: 94%  92% 96%    Intake/Output Summary (Last 24 hours) at 10/06/15 1515 Last data filed at 10/06/15 1200  Gross per 24 hour  Intake    720 ml  Output   4825 ml  Net  -4105 ml    Exam:  General: eomi ncat Cardiovascular: s1 s 2no m/r/g Respiratory: clear no added sound Abdomen:  Soft nt nd no rebound Skin intact, le's softer and less swollen-blister to back of the LLE  Neuro intact  Data Reviewed: Basic Metabolic Panel:  Recent Labs Lab 10/05/15 1215 10/06/15 0602  NA 138 142  K 3.8 3.9  CL 101 98*  CO2 27 32  GLUCOSE 152* 135*  BUN 10 13  CREATININE 0.56* 0.69  CALCIUM 9.3 9.6   Liver Function Tests:  Recent Labs Lab 10/05/15 1215  AST 44*  ALT 34  ALKPHOS 66  BILITOT 0.4  PROT 6.8  ALBUMIN 4.1   No results for input(s): LIPASE,  AMYLASE in the last 168 hours. No results for input(s): AMMONIA in the last 168 hours. CBC:  Recent Labs Lab 10/05/15 1215 10/06/15 0602  WBC 5.4 5.7  NEUTROABS 3.3  --   HGB 13.7 14.5  HCT 40.5 43.3  MCV 83.0 84.1  PLT 233 246   Cardiac Enzymes:  Recent Labs Lab 10/05/15 1215  TROPONINI <0.03   BNP: Invalid input(s): POCBNP CBG:  Recent Labs Lab 10/05/15 1231 10/05/15 2241  GLUCAP 132* 143*    No results found for this or any previous visit (from the past 240 hour(s)).   Studies:              All Imaging reviewed and is as per above notation   Scheduled Meds: . aspirin EC  81 mg Oral Daily  . benazepril  40 mg Oral Daily  . ferrous sulfate  325 mg Oral Daily  . furosemide  80 mg Intravenous 3 times per day  . gabapentin  400 mg Oral TID  . heparin  5,000 Units Subcutaneous 3 times per day  . insulin glargine  30 Units Subcutaneous QHS  . levothyroxine  50 mcg Oral QAC breakfast  . magnesium oxide  400 mg Oral BID  . meloxicam  15 mg  Oral Daily  . metFORMIN  1,000 mg Oral BID WC  . nebivolol  5 mg Oral Daily  . potassium chloride  40 mEq Oral BID  . sodium chloride  3 mL Intravenous Q12H   Continuous Infusions:    Assessment/Plan:    Anasarca -Multifactorial but most likely secondary to lack of diuretic in the setting of chronic lymphedema with a history of chronic diastolic heart failure -now probably has an acute component of CHF -Given a one-time dose of 40 Lasix IV in the ED as he is Lasix tolerant and taking Bumex which is 40 times more powerful than Lasix , we will give him 160 mg of Lasix 1 and then Lasix 80 mg IV 3 times/day.  -4.9 liters so far.  wght 96-->92 kg -History chest x-ray is impressive for global enlargement of the cardiac silhouette and confluence and I suspect he does not take his medications regularly and may need simplification of the regimen  -Cut back nebivolol from 10--5 mg daily -For now hold metolazone -His weight seems  to be stable and 212 range over the past couple of hospital visits   ? Pericardial effusion Bedside echo did not confirm this Formal echo pending Appreciate ED workup performed expediently  Body mass index is 41.4 kg/(m^2).  -Some of the tissue is probably tissue fluid or lymphedema -We will need some follow-up as an outpatient to prevent the same -Continue Unna boots and change weekly -Mobilize with therapy -Obesity is life-threatening in his case   Hypothyroidism -Continue levothyroxine 50 g daily -Follow TSH free T4 free T3 as myxedema can present in the same way.   Type II diabetes mellitus with neurological manifestations (HCC) -Continue Lantus 30 units -Okay to continue cautiously metformin 1000 twice a day -Hold glipizide 10 mg daily before meals out of concern for hypoglycemia -Continue gabapentin 400 3 times a day -Start sliding scale supplementation moderate coverage -cbg 132-140 ranges   NASH (nonalcoholic steatohepatitis) -AST/ALT slightly elevated -Check INR in the morning-  DDD (degenerative disc disease), lumbosacral + S/P arthroscopy of left knee -Continue Mobic 15 daily -Continue Percocet 7.5-3.25 every 6 when necessary pain -Mobilize with therapy   Lymphedema -See above discussion  hypokalemia and hypomagnesemia  likely secondary to diuretic use  Continue replacement and we will adjust in a.m. if needed  Will be in hosptial over weekedn Keep tele PT rec's SNF care on d/c  Verneita Griffes, MD  [facility] Pager [phone] 10/06/2015, 3:15 PM    LOS: 1 day

## 2015-10-06 NOTE — Care Management Note (Signed)
Case Management Note  Patient Details  Name: Michael [name] MRN: [id number] Date of Birth: 1945/12/18  Subjective/Objective:                  Pt admitted from home with CHF. Pt lives alone but unable to care for himself at this time.   Action/Plan: PT recommended SNF. CSW is aware and will start bed search.  Expected Discharge Date:  10/09/15               Expected Discharge Plan:  Skilled Nursing Facility  In-House Referral:  Clinical Social Work  Discharge planning Services  CM Consult  Post Acute Care Choice:  NA Choice offered to:  NA  DME Arranged:    DME Agency:     HH Arranged:    [place] Agency:     Status of Service:  Completed, signed off  Medicare Important Message Given:  Yes Date Medicare IM Given:    Medicare IM give by:    Date Additional Medicare IM Given:    Additional Medicare Important Message give by:     If discussed at [place] of Stay Meetings, dates discussed:    Additional Comments:  Joylene Draft, RN 10/06/2015, 3:04 PM

## 2015-10-06 NOTE — Clinical Social Work Placement (Signed)
   CLINICAL SOCIAL WORK PLACEMENT  NOTE  Date:  10/06/2015  Patient Details  Name: Michael [name] MRN: [id number] Date of Birth: 1946-06-03  Clinical Social Work is seeking post-discharge placement for this patient at the [place] level of care (*CSW will initial, date and re-position this form in  chart as items are completed):  Yes   Patient/family provided with [facility] Work Department's list of facilities offering this level of care within the geographic area requested by the patient (or if unable, by the patient's family).  Yes   Patient/family informed of their freedom to choose among providers that offer the needed level of care, that participate in Medicare, Medicaid or managed care program needed by the patient, have an available bed and are willing to accept the patient.  Yes   Patient/family informed of [facility]'s ownership interest in [facility] and [facility], as well as of the fact that they are under no obligation to receive care at these facilities.  PASRR submitted to EDS on 10/06/15     PASRR number received on 10/06/15     Existing PASRR number confirmed on       FL2 transmitted to all facilities in geographic area requested by pt/family on 10/06/15     FL2 transmitted to all facilities within larger geographic area on       Patient informed that his/her managed care company has contracts with or will negotiate with certain facilities, including the following:            Patient/family informed of bed offers received.  Patient chooses bed at       Physician recommends and patient chooses bed at      Patient to be transferred to   on  .  Patient to be transferred to facility by       Patient family notified on   of transfer.  Name of family member notified:        PHYSICIAN       Additional Comment:    _______________________________________________ Salome Arnt, LCSW 10/06/2015, 12:25  PM [phone]

## 2015-10-06 NOTE — NC FL2 (Signed)
[place] LEVEL OF CARE SCREENING TOOL     IDENTIFICATION  Patient Name: Michael [name] Birthdate: [birth year] Sex: male Admission Date (Current Location): 10/05/2015  [facility] and [place] Number:  [employer] and Address:  [place] [address] [place]      Provider Number: [id number]  Attending Physician Name and Address:  Nita Sells, MD  Relative Name and Phone Number:       Current Level of Care: Hospital Recommended Level of Care: [place] Prior Approval Number:    Date Approved/Denied:   PASRR Number:  [id number])  Discharge Plan: SNF    Current Diagnoses: Patient Active Problem List   Diagnosis Date Noted  . Peripheral edema 10/05/2015  . Anasarca 10/05/2015  . CHF (congestive heart failure) ([place]) 10/05/2015  . Diastolic CHF ([place]) 35/59/7416  . Diastolic dysfunction 38/45/3646  . Venous stasis dermatitis 04/20/2015  . Stage II pressure ulcer of sacral region 02/13/2015  . Atypical nevi 01/18/2015  . Intertriginous candidiasis 12/23/2014  . Hypothyroidism 11/30/2014  . Bradycardia 07/21/2014  . Sinus pause 07/21/2014  . Liver fibrosis ([place]) 06/29/2014  . IDA (iron deficiency anemia) 06/29/2014  . Iron deficiency anemia 05/31/2014  . Hepatomegaly 05/25/2014  . Difficulty walking 05/03/2014  . Knee pain, chronic 05/03/2014  . Stiffness of joint, not elsewhere classified, pelvic region and thigh 05/03/2014  . Dermatitis 04/12/2014  . HNP (herniated nucleus pulposus), lumbar 08/30/2013  . Spinal stenosis of lumbar region 07/06/2013  . Carpal tunnel syndrome 04/13/2013  . Lymphedema 04/13/2013  . Status post arthroscopy of left knee 04/13/2013  . DDD (degenerative disc disease), lumbosacral 04/13/2013  . Peripheral neuropathy ([place]) 01/05/2013  . S/P arthroscopy of left knee 11/09/2012  . Osteoarthritis of left knee 10/27/2012  . Meniscus tear 10/07/2012  . Patellar tendonitis  07/01/2012  . NASH (nonalcoholic steatohepatitis) 05/22/2007  . Type II diabetes mellitus with neurological manifestations ([place]) 05/20/2007  . Hyperlipidemia 10/15/2006  . OBESITY, MORBID 10/15/2006  . Essential hypertension 10/15/2006  . GERD 10/15/2006    Orientation RESPIRATION BLADDER Height & Weight    Self, Time, Situation, Place  Normal Continent 5' (152.4 cm) 204 lbs.  BEHAVIORAL SYMPTOMS/MOOD NEUROLOGICAL BOWEL NUTRITION STATUS      Continent Diet (Diet: Heart Healthy)  AMBULATORY STATUS COMMUNICATION OF NEEDS Skin   Total Care Verbally PU Stage and Appropriate Care (Left leg)   PU Stage 2 Dressing: Daily                   Personal Care Assistance Level of Assistance  Bathing, Dressing Bathing Assistance: Maximum assistance   Dressing Assistance: Maximum assistance     Functional Limitations Info  Sight, Hearing, Speech Sight Info: Adequate Hearing Info: Adequate Speech Info: Adequate    SPECIAL CARE FACTORS FREQUENCY  PT (By licensed PT)     PT Frequency:  (5x/week)              Contractures      Additional Factors Info  Psychotropic     Psychotropic Info:  (Valium, Neurontin)         Current Medications (10/06/2015):  This is the current hospital active medication list Current Facility-Administered Medications  Medication Dose Route Frequency Provider Last Rate Last Dose  . aspirin EC tablet 81 mg  81 mg Oral Daily Nita Sells, MD   81 mg at 10/06/15 0816  . benazepril (LOTENSIN) tablet 40 mg  40 mg Oral Daily Jai-Gurmukh Samtani,  MD   40 mg at 10/06/15 0816  . diazepam (VALIUM) tablet 5 mg  5 mg Oral QHS PRN Nita Sells, MD      . ferrous sulfate tablet 325 mg  325 mg Oral Daily Nita Sells, MD   325 mg at 10/06/15 0817  . furosemide (LASIX) injection 80 mg  80 mg Intravenous 3 times per day Nita Sells, MD   80 mg at 10/06/15 0516  . gabapentin (NEURONTIN) capsule 400 mg  400 mg Oral TID Nita Sells, MD   400 mg at 10/06/15 0817  . heparin injection 5,000 Units  5,000 Units Subcutaneous 3 times per day Nita Sells, MD   5,000 Units at 10/06/15 0516  . insulin glargine (LANTUS) injection 30 Units  30 Units Subcutaneous QHS Nita Sells, MD   30 Units at 10/05/15 2241  . levothyroxine (SYNTHROID, LEVOTHROID) tablet 50 mcg  50 mcg Oral QAC breakfast Nita Sells, MD   50 mcg at 10/06/15 0817  . magnesium oxide (MAG-OX) tablet 400 mg  400 mg Oral BID Nita Sells, MD   400 mg at 10/06/15 0817  . meloxicam (MOBIC) tablet 15 mg  15 mg Oral Daily Nita Sells, MD   15 mg at 10/06/15 0817  . metFORMIN (GLUCOPHAGE) tablet 1,000 mg  1,000 mg Oral BID WC Nita Sells, MD   1,000 mg at 10/06/15 0816  . nebivolol (BYSTOLIC) tablet 5 mg  5 mg Oral Daily Nita Sells, MD   5 mg at 10/06/15 0816  . oxyCODONE-acetaminophen (PERCOCET) 7.5-325 MG per tablet 1 tablet  1 tablet Oral Q6H PRN Nita Sells, MD   1 tablet at 10/06/15 0816  . potassium chloride (K-DUR,KLOR-CON) CR tablet 40 mEq  40 mEq Oral BID Nita Sells, MD   40 mEq at 10/06/15 0816  . sodium chloride 0.9 % injection 3 mL  3 mL Intravenous Q12H Nita Sells, MD   3 mL at 10/05/15 2200     Discharge Medications: Please see discharge summary for a list of discharge medications.  Relevant Imaging Results:  Relevant Lab Results:   Additional Information    Shakura Cowing, Clydene Pugh, LCSW

## 2015-10-06 NOTE — Plan of Care (Signed)
Problem: Acute Rehab PT Goals(only PT should resolve) Goal: Pt Will Go Sit To Supine/Side Pt will demonstrate ModI bed mobility supine to sitting edge-of-bed to return to PLOF and to decrease caregiver burden.     Goal: Patient Will Perform Sitting Balance Pt will demonstrate normal sitting balance at EOB for up to 5 minutes of BUE functional activity to restore indep in ADL.     Goal: Pt Will Transfer Bed To Chair/Chair To Bed Pt will transfer bed to/from chair with RW at [place] without loss-of-balance to demonstrate good safety awareness for independent mobility in home.

## 2015-10-07 LAB — BASIC METABOLIC PANEL
ANION GAP: 10 (ref 5–15)
BUN: 24 mg/dL — ABNORMAL HIGH (ref 6–20)
CHLORIDE: 99 mmol/L — AB (ref 101–111)
CO2: 31 mmol/L (ref 22–32)
Calcium: 9.4 mg/dL (ref 8.9–10.3)
Creatinine, Ser: 0.88 mg/dL (ref 0.61–1.24)
Glucose, Bld: 140 mg/dL — ABNORMAL HIGH (ref 65–99)
POTASSIUM: 4.6 mmol/L (ref 3.5–5.1)
SODIUM: 140 mmol/L (ref 135–145)

## 2015-10-07 LAB — GLUCOSE, CAPILLARY
GLUCOSE-CAPILLARY: 122 mg/dL — AB (ref 65–99)
GLUCOSE-CAPILLARY: 117 mg/dL — AB (ref 65–99)
GLUCOSE-CAPILLARY: 121 mg/dL — AB (ref 65–99)
GLUCOSE-CAPILLARY: 113 mg/dL — AB (ref 65–99)

## 2015-10-07 LAB — TSH: TSH: 4.708 u[IU]/mL — ABNORMAL HIGH (ref 0.350–4.500)

## 2015-10-07 LAB — T4, FREE: FREE T4: 1.16 ng/dL — AB (ref 0.61–1.12)

## 2015-10-07 MED ORDER — LEVOTHYROXINE SODIUM 75 MCG PO TABS
75.0000 ug | ORAL_TABLET | Freq: Every day | ORAL | Status: DC
  Administered 2015-10-08 – 2015-10-09 (×2): 75 ug via ORAL
  Filled 2015-10-07 (×2): qty 1

## 2015-10-07 MED ORDER — BUMETANIDE 1 MG PO TABS
2.0000 mg | ORAL_TABLET | Freq: Two times a day (BID) | ORAL | Status: DC
  Administered 2015-10-07 – 2015-10-09 (×4): 2 mg via ORAL
  Filled 2015-10-07 (×4): qty 2

## 2015-10-07 MED ORDER — DICLOFENAC SODIUM 1 % TD GEL
2.0000 g | Freq: Four times a day (QID) | TRANSDERMAL | Status: DC
  Administered 2015-10-07 – 2015-10-09 (×7): 2 g via TOPICAL
  Filled 2015-10-07: qty 100

## 2015-10-07 NOTE — Progress Notes (Signed)
Michael [name]:[id number] DOB: 05-18-1946 DOA: 10/05/2015 PCP: Vic Blackbird, MD  Brief narrative: 70 y/o ? Chr Diastolic HF-lowest recorded weight 201 lbs EF 05/2015 EF60-65% grad II bradycardia Non-compliant on salt restriction DM ty II Body mass index is 41.4 kg/(m^2). Htn Nsaid induced Gastritis on EGD 07/21/14 Iron deficiency anemia on Feraheme replacement in the past NASH + Hepatomegally Lumbar degen ddd s/p surgery l2-l3 Iron def anemia  Admitted with overt acute HF in addition to worsening lymphedema in setting  Non-compliacne bumex as OP  Past medical history-As per Problem list Chart reviewed as below-   Consultants:  none  Procedures:  none  Antibiotics:  none   Subjective   Doing fair Clarifies today he has knee pains bilateraqlly and use stopical voltaren and wants this No cp  No n/v NO unilateral weakness   Objective    Interim History:   Telemetry: sinus   Objective: Filed Vitals:   10/06/15 1328 10/06/15 2100 10/06/15 2153 10/07/15 0525  BP: 158/73  117/69 129/74  Pulse: 86 78 84 78  Temp: 98.3 F (36.8 C)  98.3 F (36.8 C) 97.7 F (36.5 C)  TempSrc: Oral  Oral Oral  Resp: 20 16 20 20   Height:      Weight:    90.5 kg (199 lb 8.3 oz)  SpO2: 96% 94% 94% 93%    Intake/Output Summary (Last 24 hours) at 10/07/15 1436 Last data filed at 10/07/15 1025  Gross per 24 hour  Intake    240 ml  Output   2800 ml  Net  -2560 ml    Exam:  General: eomi ncat Cardiovascular: s1 s 2no m/r/g Respiratory: clear no added sound Abdomen:  Soft nt nd no rebound Skin intact, le's softer and less swollen-blister to back of the LLE  Neuro intact  Data Reviewed: Basic Metabolic Panel:  Recent Labs Lab 10/05/15 1215 10/06/15 0602 10/07/15 0609  NA 138 142 140  K 3.8 3.9 4.6  CL 101 98* 99*  CO2 27 32 31  GLUCOSE 152* 135* 140*  BUN 10 13 24*  CREATININE 0.56* 0.69 0.88  CALCIUM 9.3 9.6 9.4   Liver Function Tests:  Recent  Labs Lab 10/05/15 1215  AST 44*  ALT 34  ALKPHOS 66  BILITOT 0.4  PROT 6.8  ALBUMIN 4.1   No results for input(s): LIPASE, AMYLASE in the last 168 hours. No results for input(s): AMMONIA in the last 168 hours. CBC:  Recent Labs Lab 10/05/15 1215 10/06/15 0602  WBC 5.4 5.7  NEUTROABS 3.3  --   HGB 13.7 14.5  HCT 40.5 43.3  MCV 83.0 84.1  PLT 233 246   Cardiac Enzymes:  Recent Labs Lab 10/05/15 1215  TROPONINI <0.03   BNP: Invalid input(s): POCBNP CBG:  Recent Labs Lab 10/05/15 1231 10/05/15 2241 10/06/15 2255 10/07/15 0750 10/07/15 1140  GLUCAP 132* 143* 138* 122* 117*    No results found for this or any previous visit (from the past 240 hour(s)).   Studies:              All Imaging reviewed and is as per above notation   Scheduled Meds: . aspirin EC  81 mg Oral Daily  . benazepril  40 mg Oral Daily  . bumetanide  2 mg Oral BID  . diclofenac sodium  2 g Topical QID  . ferrous sulfate  325 mg Oral Daily  . gabapentin  400 mg Oral TID  . heparin  5,000 Units  Subcutaneous 3 times per day  . insulin glargine  30 Units Subcutaneous QHS  . levothyroxine  50 mcg Oral QAC breakfast  . magnesium oxide  400 mg Oral BID  . meloxicam  15 mg Oral Daily  . metFORMIN  1,000 mg Oral BID WC  . nebivolol  5 mg Oral Daily  . potassium chloride  40 mEq Oral BID  . sodium chloride  3 mL Intravenous Q12H   Continuous Infusions:    Assessment/Plan:    Anasarca -Multifactorial but most likely secondary to lack of diuretic in the setting of chronic lymphedema with a history of chronic diastolic heart failure -now probably has an acute component of CHF -Given a one-time dose of 40 Lasix IV in the ED, 160 mg of Lasix 1 --> Lasix 80 mg IV 3 times/day--> home dosing Bumex 2 mg bid 10/07/15.  -7.4 liters wght 96-->90.5 which is dry weight  chest x-ray =Cardiomegally may need simplification of the regimen  -Cut back nebivolol from 10--5 mg daily - hold metolazone  ?  Pericardial effusion Bedside echo did not confirm this Formal echo EF 55-60%  Body mass index is 41.4 kg/(m^2).  -Some of the tissue is probably tissue fluid or lymphedema -We will need some follow-up as an outpatient to prevent the same -Continue Unna boots and change weekly -Mobilize with therapy -Obesity is life-threatening in his case   Hypothyroidism -increase home dose levothyroxine 50 g daily-->75 -TSH ? which could contribute to hypothyroid and mild myedema -follow free T4 free T3 .   Type II diabetes mellitus with neurological manifestations (HCC) -Continue Lantus 30 units -Okay to continue cautiously metformin 1000 twice a day -Hold glipizide 10 mg daily before meals out of concern for hypoglycemia -Continue gabapentin 400 3 times a day -Start sliding scale supplementation moderate coverage -cbg 122-117 ranges   NASH (nonalcoholic steatohepatitis) -AST/ALT slightly elevated -INR wnl  DDD (degenerative disc disease), lumbosacral + S/P arthroscopy of left knee -Continue Mobic 15 daily -Continue Percocet 7.5-3.25 every 6 when necessary pain -Mobilize with therapy   Lymphedema -See above discussion  hypokalemia and hypomagnesemia  likely secondary to diuretic use  Continue replacement    Will be in hosptial over weekendf D/c tele PT rec's SNF care on d/c  Verneita Griffes, MD  [facility] Pager [phone] 10/07/2015, 2:36 PM    LOS: 2 days

## 2015-10-08 LAB — GLUCOSE, CAPILLARY
GLUCOSE-CAPILLARY: 127 mg/dL — AB (ref 65–99)
GLUCOSE-CAPILLARY: 107 mg/dL — AB (ref 65–99)
Glucose-Capillary: 137 mg/dL — ABNORMAL HIGH (ref 65–99)
Glucose-Capillary: 147 mg/dL — ABNORMAL HIGH (ref 65–99)

## 2015-10-08 NOTE — Progress Notes (Signed)
Michael [name] [id number] DOB: 1946-09-09 DOA: 10/05/2015 PCP: Vic Blackbird, MD  Brief narrative: 70 y/o ? Chr Diastolic HF-lowest recorded weight 201 lbs EF 05/2015 EF60-65% grad II bradycardia Non-compliant on salt restriction DM ty II Body mass index is 41.4 kg/(m^2). Htn Nsaid induced Gastritis on EGD 07/21/14 Iron deficiency anemia on Feraheme replacement in the past NASH + Hepatomegally Lumbar degen ddd s/p surgery l2-l3 Iron def anemia  Admitted with overt acute HF in addition to worsening lymphedema in setting  Non-compliance bumex as OP  Past medical history-As per Problem list Chart reviewed as below-   Consultants:  none  Procedures:  none  Antibiotics:  none   Subjective   Better Knee pain is imporved denies n/v/cp No weakness   Objective    Interim History:   Telemetry: sinus   Objective: Filed Vitals:   10/07/15 0525 10/07/15 1533 10/07/15 2100 10/08/15 0600  BP: 129/74 132/67 114/61 140/63  Pulse: 78 86 70 84  Temp: 97.7 F (36.5 C) 98.2 F (36.8 C) 98 F (36.7 C) 98.3 F (36.8 C)  TempSrc: Oral  Oral Oral  Resp: 20 18 20 18   Height:      Weight: 90.5 kg (199 lb 8.3 oz)   90.4 kg (199 lb 4.7 oz)  SpO2: 93% 95% 91% 93%    Intake/Output Summary (Last 24 hours) at 10/08/15 1335 Last data filed at 10/08/15 0950  Gross per 24 hour  Intake      0 ml  Output   1100 ml  Net  -1100 ml    Exam:  General: eomi ncat Cardiovascular: s1 s2 no m/r/g Respiratory: clear no added sound Abdomen:  Soft nt nd no rebound Skin intact, LE's are softer and less swollen-blister to back of the LLE  Neuro intact  Data Reviewed: Basic Metabolic Panel:  Recent Labs Lab 10/05/15 1215 10/06/15 0602 10/07/15 0609  NA 138 142 140  K 3.8 3.9 4.6  CL 101 98* 99*  CO2 27 32 31  GLUCOSE 152* 135* 140*  BUN 10 13 24*  CREATININE 0.56* 0.69 0.88  CALCIUM 9.3 9.6 9.4   Liver Function Tests:  Recent Labs Lab 10/05/15 1215  AST  44*  ALT 34  ALKPHOS 66  BILITOT 0.4  PROT 6.8  ALBUMIN 4.1   No results for input(s): LIPASE, AMYLASE in the last 168 hours. No results for input(s): AMMONIA in the last 168 hours. CBC:  Recent Labs Lab 10/05/15 1215 10/06/15 0602  WBC 5.4 5.7  NEUTROABS 3.3  --   HGB 13.7 14.5  HCT 40.5 43.3  MCV 83.0 84.1  PLT 233 246   Cardiac Enzymes:  Recent Labs Lab 10/05/15 1215  TROPONINI <0.03   BNP: Invalid input(s): POCBNP CBG:  Recent Labs Lab 10/07/15 1140 10/07/15 1644 10/07/15 2232 10/08/15 0735 10/08/15 1134  GLUCAP 117* 121* 113* 127* 137*    No results found for this or any previous visit (from the past 240 hour(s)).   Studies:              All Imaging reviewed and is as per above notation   Scheduled Meds: . aspirin EC  81 mg Oral Daily  . benazepril  40 mg Oral Daily  . bumetanide  2 mg Oral BID  . diclofenac sodium  2 g Topical QID  . ferrous sulfate  325 mg Oral Daily  . gabapentin  400 mg Oral TID  . heparin  5,000 Units Subcutaneous 3 times per  day  . insulin glargine  30 Units Subcutaneous QHS  . levothyroxine  75 mcg Oral QAC breakfast  . magnesium oxide  400 mg Oral BID  . meloxicam  15 mg Oral Daily  . metFORMIN  1,000 mg Oral BID WC  . nebivolol  5 mg Oral Daily  . potassium chloride  40 mEq Oral BID  . sodium chloride  3 mL Intravenous Q12H   Continuous Infusions:    Assessment/Plan:    Anasarca -Multifactorial but most likely secondary to lack of diuretic in the setting of chronic lymphedema with a history of chronic diastolic heart failure  -clinically has now resolved on 1.8.17 -Given a one-time dose of 40 Lasix IV in the ED, 160 mg of Lasix 1 --> Lasix 80 mg IV 3 times/day--> home dosing Bumex 2 mg bid 10/07/15.  -8.5 liters wght 96-->90.4 which is dry weight  chest x-ray =Cardiomegally may need simplification of the regimen  -Cut back nebivolol from 10--5 mg daily - hold metolazone  ? Pericardial effusion Bedside echo  did not confirm this Formal echo EF 55-60%  Body mass index is 41.4 kg/(m^2).  -Some of the tissue is probably tissue fluid or lymphedema -We will need some follow-up as an outpatient to prevent the same -Continue Unna boots at [facility] with therapy -Obesity is life-threatening in his case   Hypothyroidism -increase home dose levothyroxine 50 g daily-->75 -TSH ? which could contribute to hypothyroid and mild myedema -follow free T4 free T3 .   Type II diabetes mellitus with neurological manifestations (HCC) -Continue Lantus 30 units -Okay to continue cautiously metformin 1000 twice a day -Hold glipizide 10 mg daily before meals out of concern for hypoglycemia -Continue gabapentin 400 3 times a day -Start sliding scale supplementation moderate coverage -cbg 127-137 ranges   NASH (nonalcoholic steatohepatitis) -AST/ALT slightly elevated -INR wnl  DDD (degenerative disc disease), lumbosacral + S/P arthroscopy of left knee -Continue Mobic 15 daily -Continue Percocet 7.5-3.25 every 6 when necessary pain -Mobilize with therapy   Lymphedema -See above discussion  hypokalemia and hypomagnesemia  likely secondary to diuretic use  Recheck am labs   Likely can d/c home in am PT rec's SNF care on d/c Updated niece who is HCPOA  Verneita Griffes, MD  [facility] Pager [phone] 10/08/2015, 1:35 PM    LOS: 3 days

## 2015-10-09 ENCOUNTER — Inpatient Hospital Stay
Admission: RE | Admit: 2015-10-09 | Discharge: 2015-10-28 | Disposition: A | Discharge status: Home / Self Care | Payer: Medicare Other | Source: Ambulatory Visit | Attending: Internal Medicine | Admitting: Internal Medicine

## 2015-10-09 DIAGNOSIS — K740 Hepatic fibrosis: Secondary | ICD-10-CM | POA: Diagnosis not present

## 2015-10-09 DIAGNOSIS — R635 Abnormal weight gain: Secondary | ICD-10-CM | POA: Diagnosis not present

## 2015-10-09 DIAGNOSIS — M6289 Other specified disorders of muscle: Secondary | ICD-10-CM | POA: Diagnosis not present

## 2015-10-09 DIAGNOSIS — M6281 Muscle weakness (generalized): Secondary | ICD-10-CM | POA: Diagnosis not present

## 2015-10-09 DIAGNOSIS — E785 Hyperlipidemia, unspecified: Secondary | ICD-10-CM | POA: Diagnosis not present

## 2015-10-09 DIAGNOSIS — R601 Generalized edema: Secondary | ICD-10-CM | POA: Diagnosis not present

## 2015-10-09 DIAGNOSIS — M1612 Unilateral primary osteoarthritis, left hip: Secondary | ICD-10-CM | POA: Diagnosis not present

## 2015-10-09 DIAGNOSIS — E1122 Type 2 diabetes mellitus with diabetic chronic kidney disease: Secondary | ICD-10-CM | POA: Diagnosis not present

## 2015-10-09 DIAGNOSIS — R160 Hepatomegaly, not elsewhere classified: Secondary | ICD-10-CM | POA: Diagnosis not present

## 2015-10-09 DIAGNOSIS — I5032 Chronic diastolic (congestive) heart failure: Secondary | ICD-10-CM | POA: Diagnosis not present

## 2015-10-09 DIAGNOSIS — R262 Difficulty in walking, not elsewhere classified: Secondary | ICD-10-CM | POA: Diagnosis not present

## 2015-10-09 DIAGNOSIS — I890 Lymphedema, not elsewhere classified: Secondary | ICD-10-CM | POA: Diagnosis not present

## 2015-10-09 DIAGNOSIS — I5030 Unspecified diastolic (congestive) heart failure: Secondary | ICD-10-CM | POA: Diagnosis not present

## 2015-10-09 DIAGNOSIS — G629 Polyneuropathy, unspecified: Secondary | ICD-10-CM | POA: Diagnosis not present

## 2015-10-09 DIAGNOSIS — E039 Hypothyroidism, unspecified: Secondary | ICD-10-CM | POA: Diagnosis not present

## 2015-10-09 DIAGNOSIS — K7581 Nonalcoholic steatohepatitis (NASH): Secondary | ICD-10-CM | POA: Diagnosis not present

## 2015-10-09 DIAGNOSIS — L89312 Pressure ulcer of right buttock, stage 2: Secondary | ICD-10-CM | POA: Diagnosis not present

## 2015-10-09 DIAGNOSIS — R488 Other symbolic dysfunctions: Secondary | ICD-10-CM | POA: Diagnosis not present

## 2015-10-09 DIAGNOSIS — M4806 Spinal stenosis, lumbar region: Secondary | ICD-10-CM | POA: Diagnosis not present

## 2015-10-09 DIAGNOSIS — D509 Iron deficiency anemia, unspecified: Secondary | ICD-10-CM | POA: Diagnosis not present

## 2015-10-09 DIAGNOSIS — E1149 Type 2 diabetes mellitus with other diabetic neurological complication: Secondary | ICD-10-CM | POA: Diagnosis not present

## 2015-10-09 DIAGNOSIS — M150 Primary generalized (osteo)arthritis: Secondary | ICD-10-CM | POA: Diagnosis not present

## 2015-10-09 DIAGNOSIS — R278 Other lack of coordination: Secondary | ICD-10-CM | POA: Diagnosis not present

## 2015-10-09 DIAGNOSIS — I10 Essential (primary) hypertension: Secondary | ICD-10-CM | POA: Diagnosis not present

## 2015-10-09 LAB — BASIC METABOLIC PANEL
ANION GAP: 10 (ref 5–15)
BUN: 32 mg/dL — ABNORMAL HIGH (ref 6–20)
CHLORIDE: 98 mmol/L — AB (ref 101–111)
CO2: 29 mmol/L (ref 22–32)
Calcium: 9.2 mg/dL (ref 8.9–10.3)
Creatinine, Ser: 0.86 mg/dL (ref 0.61–1.24)
GFR calc non Af Amer: >60 mL/min (ref >60)
Glucose, Bld: 121 mg/dL — ABNORMAL HIGH (ref 65–99)
POTASSIUM: 4.5 mmol/L (ref 3.5–5.1)
SODIUM: 137 mmol/L (ref 135–145)

## 2015-10-09 LAB — GLUCOSE, CAPILLARY
GLUCOSE-CAPILLARY: 134 mg/dL — AB (ref 65–99)
GLUCOSE-CAPILLARY: 119 mg/dL — AB (ref 65–99)

## 2015-10-09 LAB — T3, FREE: T3, Free: 3.3 pg/mL (ref 2.0–4.4)

## 2015-10-09 MED ORDER — LEVOTHYROXINE SODIUM 75 MCG PO TABS: 75.0000 ug | ORAL_TABLET | Freq: Every day | ORAL | Status: DC

## 2015-10-09 MED ORDER — OXYCODONE-ACETAMINOPHEN 7.5-325 MG PO TABS: 1.0000 | ORAL_TABLET | Freq: Four times a day (QID) | ORAL | Status: DC | PRN

## 2015-10-09 MED ORDER — DIAZEPAM 5 MG PO TABS: 5.0000 mg | ORAL_TABLET | Freq: Every evening | ORAL | Status: DC | PRN

## 2015-10-09 NOTE — Progress Notes (Signed)
Pt discharged to [facility] today per Dr. Verlon Au.  Pt's IV site D/C'd and WDL.  Pt's VSS.  Report called to Reita May, nurse at [facility].  Verbalized understanding.  All questions answered accordingly.  Pt left floor via WC in stable condition accompanied by NT.

## 2015-10-09 NOTE — Discharge Summary (Signed)
Physician Discharge Summary  Michael [name] [id number] DOB: 29-Mar-1946 DOA: 10/05/2015  PCP: Vic Blackbird, MD  Admit date: 10/05/2015 Discharge date: 10/09/2015  Time spent: 35 minutes  Recommendations for Outpatient Follow-up:  1. Patient needs a basic metabolic panel in 1 week as is on diuresis with Bumex 2. Titrate gabapentin as tolerated as an outpatient for nerve in back pain 3. Prescription given for oxycodone on discharge 4. He has been discontinued off the metolazone and instead should have compliance with low-salt diet and with diuretics Bumex 2 mg twice a day. I might recommend transition him to Bumex 1 mg twice a day dependent on labs within a week initially during hospital stay and this was added back on discharge 5. Patient will need Unna boots placed at nursing facility-please pay tension to left posterior calf which has a healing wound 6. He will be discharged to skilled nursing facility for further evaluation and management and care of his difficulty with mobility 7. Recheck TSH as an outpatient and adjust thyroxine in about one month  Discharge Diagnoses:  Principal Problem:   Anasarca Active Problems:   Type II diabetes mellitus with neurological manifestations (HCC)   OBESITY, MORBID   NASH (nonalcoholic steatohepatitis)   S/P arthroscopy of left knee   Lymphedema   DDD (degenerative disc disease), lumbosacral   Bradycardia   Hypothyroidism   Stage II pressure ulcer of sacral region   Diastolic CHF (HCC)   Peripheral edema   CHF (congestive heart failure) ([place])   Discharge Condition: Stable  Diet recommendation: Heart healthy low-salt fluid restriction 1500 cc  Filed Weights   10/07/15 0525 10/08/15 0600 10/09/15 0603  Weight: 90.5 kg (199 lb 8.3 oz) 90.4 kg (199 lb 4.7 oz) 88.5 kg (195 lb 1.7 oz)    History of present illness:  70 y/o ? Chr Diastolic HF-lowest recorded weight 201 lbs EF 05/2015 EF60-65% grad II bradycardia Non-compliant on salt  restriction DM ty II Body mass index is 41.4 kg/(m^2). Htn Nsaid induced Gastritis on EGD 07/21/14 Iron deficiency anemia on Feraheme replacement in the past NASH + Hepatomegally Lumbar degen ddd s/p surgery l2-l3 Iron def anemia  Hospital Course:  Anasarca -Multifactorial but most likely secondary to lack of diuretic in the setting of chronic lymphedema with a history of chronic diastolic heart failure  -clinically has now resolved on 1.8.17 -Given a one-time dose of 40 Lasix IV in the ED, 160 mg of Lasix 1 --> Lasix 80 mg IV 3 times/day--> home dosing Bumex 2 mg bid 10/07/15. -8.9 liters wght 96-->88.5 which is slightly lower than his lowest dry weight 90 kg chest x-ray =Cardiomegally without fluid overload -Cut back nebivolol from 10--5 mg daily initially on hospital stay and this was added back on discharge - hold metolazone  ? Pericardial effusion Bedside echo did not confirm this Formal echo EF 55-60%  Body mass index is 41.4 kg/(m^2).  -Some of the tissue is probably tissue fluid or lymphedema -We will need some follow-up as an outpatient to prevent the same -Continue Unna boots at [facility] with therapy -Obesity is life-threatening in his case   Hypothyroidism -increase home dose levothyroxine 50 g daily-->75 -TSH ? which could contribute to hypothyroid and mild myedema -follow free T4 free T3 .   Type II diabetes mellitus with neurological manifestations (HCC) -Continue Lantus 30 units -Okay to continue cautiously metformin 1000 twice a day -Hold glipizide 10 mg daily before meals out of concern for hypoglycemia within the hospital -Continue  gabapentin 400 3 times a day--might need up titration of this dosing as an outpatient -During hospital stay was on sliding scale supplementation moderate coverage   NASH (nonalcoholic steatohepatitis) -AST/ALT slightly elevated -INR wnl  DDD (degenerative disc disease), lumbosacral + S/P arthroscopy of left  knee -Continue Mobic 15 daily -Continue Percocet 7.5-3.25 every 6 when necessary pain -Mobilize with therapy   Lymphedema -See above discussion  hypokalemia and hypomagnesemia  likely secondary to diuretic use  Recheck am labs    Discharge Exam: Filed Vitals:   10/08/15 2130 10/09/15 0603  BP: 132/67 129/53  Pulse: 88 81  Temp: 99.8 F (37.7 C) 97.5 F (36.4 C)  Resp: 18 18   Seems to be having pain this morning No nausea no vomiting No chest pain Knees are painful and he has some allodynia  General: EOMI NCAT Cardiovascular: S1-S2 no murmur rub or gallop Respiratory: Clinically clear  Lower extremities are red and noninflamed and nonswollen Discharge Instructions   Discharge Instructions    Diet - low sodium heart healthy    Complete by:  As directed      Increase activity slowly    Complete by:  As directed           Current Discharge Medication List    CONTINUE these medications which have CHANGED   Details  diazepam (VALIUM) 5 MG tablet Take 1 tablet (5 mg total) by mouth at bedtime as needed for anxiety. Qty: 10 tablet, Refills: 0    levothyroxine (SYNTHROID, LEVOTHROID) 75 MCG tablet Take 1 tablet (75 mcg total) by mouth daily before breakfast. Qty: 30 tablet, Refills: 0    oxyCODONE-acetaminophen (PERCOCET) 7.5-325 MG tablet Take 1 tablet by mouth every 6 (six) hours as needed for severe pain. Qty: 10 tablet, Refills: 0      CONTINUE these medications which have NOT CHANGED   Details  ACCU-CHEK AVIVA PLUS test strip     aspirin EC 81 MG tablet Take 81 mg by mouth daily.    benazepril (LOTENSIN) 40 MG tablet TAKE ONE TABLET BY MOUTH DAILY. Qty: 90 tablet, Refills: 1    bumetanide (BUMEX) 2 MG tablet Take 1 tablet (2 mg total) by mouth 2 (two) times daily. Qty: 60 tablet, Refills: 1    BYSTOLIC 10 MG tablet TAKE ONE TABLET BY MOUTH DAILY. Qty: 30 tablet, Refills: 6    clobetasol cream (TEMOVATE) 0.05 % APPLY TO AFFECTED AREA 2  TIMES DAILY. Qty: 45 g, Refills: 11    diclofenac sodium (VOLTAREN) 1 % GEL Apply 4 g topically 4 (four) times daily. Qty: 5 Tube, Refills: 5   Associated Diagnoses: Patellar tendonitis, unspecified laterality    Ferrous Sulfate (IRON) 325 (65 FE) MG TABS Take 1 tablet by mouth daily.     gabapentin (NEURONTIN) 400 MG capsule TAKE ONE CAPSULE BY MOUTH THREE TIMES DAILY. Qty: 90 capsule, Refills: 3    glipiZIDE (GLUCOTROL) 10 MG tablet TAKE ONE TABLET BY MOUTH TWICE DAILY BEFORE MEALS. Qty: 180 tablet, Refills: 1    Insulin Glargine (LANTUS) 100 UNIT/ML Solostar Pen Inject 30 Units into the skin at bedtime. Qty: 15 mL, Refills: 11    Magnesium 400 MG TABS Take 400 mg by mouth 2 (two) times daily. Qty: 60 tablet, Refills: 6    meloxicam (MOBIC) 15 MG tablet Take 15 mg by mouth daily.     metFORMIN (GLUCOPHAGE) 1000 MG tablet TAKE ONE TABLET BY MOUTH TWICE DAILY WITH A MEAL. Qty: 60 tablet, Refills: 3  nystatin cream (MYCOSTATIN) Apply 1 application topically 2 (two) times daily. Qty: 45 g, Refills: 1    omeprazole (PRILOSEC) 20 MG capsule TAKE ONE CAPSULE BY MOUTH TWICE DAILY. Qty: 60 capsule, Refills: 11    potassium chloride (K-DUR) 10 MEQ tablet Take 2 tablets in the AM / Take 1 tablet in the PM Qty: 90 tablet, Refills: 3    pravastatin (PRAVACHOL) 40 MG tablet Take 1 tablet (40 mg total) by mouth every evening. Qty: 90 tablet, Refills: 1      STOP taking these medications     B-D INS SYRINGE 0.5CC/31GX5/16 31G X 5/16" 0.5 ML MISC      metolazone (ZAROXOLYN) 2.5 MG tablet        No Known Allergies    The results of significant diagnostics from this hospitalization (including imaging, microbiology, ancillary and laboratory) are listed below for reference.    Significant Diagnostic Studies: Dg Chest 2 View  10/05/2015  CLINICAL DATA:  Lower extremity swelling the past 2-3 months. Shortness of breath yesterday, worse with exertion. Intermittent chest pain.  History lymphoma. EXAM: CHEST  2 VIEW COMPARISON:  05/02/2013 FINDINGS: Interval apparent enlargement of the cardiac silhouette, potentially accentuated due to decreased lung volumes and AP projection. Evaluation the retrosternal clear space obscured secondary overlying soft tissues. Grossly unchanged mediastinal contours given decreased lung volumes. Worsening bibasilar opacities, right greater left, favored to represent atelectasis. No discrete focal airspace opacities. No pleural effusion or pneumothorax. No evidence of edema. No acute osseous abnormalities. IMPRESSION: 1. Apparent enlargement of the cardiac silhouette, potentially accentuated due to AP projection and decreased lung volumes though worsening cardiomegaly and/or a development of a pericardial effusion could result in a similar appearance. Clinical correlation is advised. Further evaluation cardiac echo could be performed as clinically indicated. No definite evidence of edema. 2. Decreased lung volumes with worsening bibasilar opacities, likely atelectasis. Electronically Signed   By: Sandi Mariscal M.D.   On: 10/05/2015 13:11    Microbiology: No results found for this or any previous visit (from the past 240 hour(s)).   Labs: Basic Metabolic Panel:  Recent Labs Lab 10/05/15 1215 10/06/15 0602 10/07/15 0609 10/09/15 0557  NA 138 142 140 137  K 3.8 3.9 4.6 4.5  CL 101 98* 99* 98*  CO2 27 32 31 29  GLUCOSE 152* 135* 140* 121*  BUN 10 13 24* 32*  CREATININE 0.56* 0.69 0.88 0.86  CALCIUM 9.3 9.6 9.4 9.2   Liver Function Tests:  Recent Labs Lab 10/05/15 1215  AST 44*  ALT 34  ALKPHOS 66  BILITOT 0.4  PROT 6.8  ALBUMIN 4.1   No results for input(s): LIPASE, AMYLASE in the last 168 hours. No results for input(s): AMMONIA in the last 168 hours. CBC:  Recent Labs Lab 10/05/15 1215 10/06/15 0602  WBC 5.4 5.7  NEUTROABS 3.3  --   HGB 13.7 14.5  HCT 40.5 43.3  MCV 83.0 84.1  PLT 233 246   Cardiac  Enzymes:  Recent Labs Lab 10/05/15 1215  TROPONINI <0.03   BNP: BNP (last 3 results)  Recent Labs  10/05/15 1215  BNP 238.0*    ProBNP (last 3 results) No results for input(s): PROBNP in the last 8760 hours.  CBG:  Recent Labs Lab 10/08/15 0735 10/08/15 1134 10/08/15 1638 10/08/15 1958 10/09/15 0810  GLUCAP 127* 137* 147* 107* 134*       Signed:  Nita Sells MD   [facility] 10/09/2015, 8:30 AM

## 2015-10-09 NOTE — Clinical Social Work Placement (Signed)
   CLINICAL SOCIAL WORK PLACEMENT  NOTE  Date:  10/09/2015  Patient Details  Name: Michael [name] MRN: [id number] Date of Birth: Jul 19, 1946  Clinical Social Work is seeking post-discharge placement for this patient at the [place] level of care (*CSW will initial, date and re-position this form in  chart as items are completed):  Yes   Patient/family provided with [place] Work Department's list of facilities offering this level of care within the geographic area requested by the patient (or if unable, by the patient's family).  Yes   Patient/family informed of their freedom to choose among providers that offer the needed level of care, that participate in Medicare, Medicaid or managed care program needed by the patient, have an available bed and are willing to accept the patient.  Yes   Patient/family informed of [facility]'s ownership interest in [facility] and [facility], as well as of the fact that they are under no obligation to receive care at these facilities.  PASRR submitted to EDS on 10/06/15     PASRR number received on 10/06/15     Existing PASRR number confirmed on       FL2 transmitted to all facilities in geographic area requested by pt/family on 10/06/15     FL2 transmitted to all facilities within larger geographic area on       Patient informed that his/her managed care company has contracts with or will negotiate with certain facilities, including the following:        Yes   Patient/family informed of bed offers received.  Patient chooses bed at [facility]     Physician recommends and patient chooses bed at      Patient to be transferred to [facility] on 10/09/15.  Patient to be transferred to facility by staff     Patient family notified on 10/09/15 of transfer.  Name of family member notified:  [name]- niece     PHYSICIAN       Additional Comment:     _______________________________________________ Salome Arnt, [place] 10/09/2015, 12:11 PM [phone]

## 2015-10-09 NOTE — Care Management Important Message (Signed)
Important Message  Patient Details  Name: Michael [name] MRN: [id number] Date of Birth: 07-24-1946   Medicare Important Message Given:  Yes    Sherald Barge, RN 10/09/2015, 10:12 AM

## 2015-10-09 NOTE — Care Management Note (Signed)
Case Management Note  Patient Details  Name: Michael [name] MRN: [id number] Date of Birth: [birth year]   Expected Discharge Date:  10/09/15               Expected Discharge Plan:  [place]  In-House Referral:  Clinical Social Work  Discharge planning Services  CM Consult  Post Acute Care Choice:  NA Choice offered to:  NA  DME Arranged:    DME Agency:     HH Arranged:    [place] Agency:     Status of Service:  Completed, signed off  Medicare Important Message Given:  Yes Date Medicare IM Given:    Medicare IM give by:    Date Additional Medicare IM Given:    Additional Medicare Important Message give by:     If discussed at [place] of Stay Meetings, dates discussed:    Additional Comments: Discharging to SNF today. CSW to arrange for placement. No CM needs.  Sherald Barge, RN 10/09/2015, 10:13 AM

## 2015-10-10 ENCOUNTER — Encounter (HOSPITAL_COMMUNITY)
Admission: RE | Admit: 2015-10-10 | Discharge: 2015-10-10 | Disposition: A | Discharge status: Home / Self Care | Payer: Medicare Other | Source: Skilled Nursing Facility | Attending: Internal Medicine | Admitting: Internal Medicine

## 2015-10-10 LAB — CBC WITH DIFFERENTIAL/PLATELET
BASOS PCT: 3 %
Basophils Absolute: 0.2 10*3/uL — ABNORMAL HIGH (ref 0.0–0.1)
EOS ABS: 0.3 10*3/uL (ref 0.0–0.7)
EOS PCT: 4 %
HCT: 45 % (ref 39.0–52.0)
HEMOGLOBIN: 15.3 g/dL (ref 13.0–17.0)
LYMPHS ABS: 2.1 10*3/uL (ref 0.7–4.0)
Lymphocytes Relative: 35 %
MCH: 28.7 pg (ref 26.0–34.0)
MCHC: 34 g/dL (ref 30.0–36.0)
MCV: 84.3 fL (ref 78.0–100.0)
MONO ABS: 0.5 10*3/uL (ref 0.1–1.0)
MONOS PCT: 9 %
NEUTROS PCT: 49 %
Neutro Abs: 3 10*3/uL (ref 1.7–7.7)
PLATELETS: 242 10*3/uL (ref 150–400)
RBC: 5.34 MIL/uL (ref 4.22–5.81)
RDW: 13.9 % (ref 11.5–15.5)
WBC: 6.1 10*3/uL (ref 4.0–10.5)

## 2015-10-10 LAB — BASIC METABOLIC PANEL
Anion gap: 11 (ref 5–15)
BUN: 44 mg/dL — AB (ref 6–20)
CALCIUM: 9.4 mg/dL (ref 8.9–10.3)
CHLORIDE: 98 mmol/L — AB (ref 101–111)
CO2: 26 mmol/L (ref 22–32)
CREATININE: 0.99 mg/dL (ref 0.61–1.24)
GFR calc non Af Amer: >60 mL/min (ref >60)
Glucose, Bld: 105 mg/dL — ABNORMAL HIGH (ref 65–99)
Potassium: 5.1 mmol/L (ref 3.5–5.1)
Sodium: 135 mmol/L (ref 135–145)

## 2015-10-11 ENCOUNTER — Non-Acute Institutional Stay (SKILLED_NURSING_FACILITY): Payer: Medicare Other | Admitting: Internal Medicine

## 2015-10-11 ENCOUNTER — Encounter (HOSPITAL_COMMUNITY)
Admission: RE | Admit: 2015-10-11 | Discharge: 2015-10-11 | Disposition: A | Discharge status: Home / Self Care | Payer: Medicare Other | Source: Skilled Nursing Facility | Attending: Internal Medicine | Admitting: Internal Medicine

## 2015-10-11 DIAGNOSIS — M6289 Other specified disorders of muscle: Secondary | ICD-10-CM | POA: Diagnosis not present

## 2015-10-11 DIAGNOSIS — N181 Chronic kidney disease, stage 1: Secondary | ICD-10-CM

## 2015-10-11 DIAGNOSIS — M1612 Unilateral primary osteoarthritis, left hip: Secondary | ICD-10-CM

## 2015-10-11 DIAGNOSIS — K7581 Nonalcoholic steatohepatitis (NASH): Secondary | ICD-10-CM | POA: Diagnosis not present

## 2015-10-11 DIAGNOSIS — R601 Generalized edema: Secondary | ICD-10-CM | POA: Diagnosis not present

## 2015-10-11 DIAGNOSIS — E1142 Type 2 diabetes mellitus with diabetic polyneuropathy: Secondary | ICD-10-CM

## 2015-10-11 DIAGNOSIS — E1122 Type 2 diabetes mellitus with diabetic chronic kidney disease: Secondary | ICD-10-CM

## 2015-10-11 DIAGNOSIS — Z794 Long term (current) use of insulin: Secondary | ICD-10-CM

## 2015-10-11 DIAGNOSIS — R29898 Other symptoms and signs involving the musculoskeletal system: Secondary | ICD-10-CM

## 2015-10-11 LAB — PROTEIN / CREATININE RATIO, URINE: CREATININE, URINE: 52.94 mg/dL

## 2015-10-11 NOTE — Progress Notes (Signed)
Patient ID: Michael [name], male   DOB: 08/07/1946, 70 y.o.   MRN: [id number]     Facility; [facility] Chief complaint; admission to SNF post admit to [facility] from 1/5 to 10/09/2015  History;this is a 70 year old man who tells me that he developed progressively increasing edema of his legs below his knees over the last 6 months. Together with pain in his knees this became a difficult ambulatory challenge and he became less and less mobile. He has not been outside his apartment since last summer. He uses a walker. His diagnosis in the hospital was anasarca. He had an echocardiogram that was reasonably normal in terms of left and right ventricular function. He was felt to have diastolic CHF, chronic lymphedema with a history of diastolic heart failure. His albumin was not reduced BUN and creatinine normal. The patient is a type II diabetic on insulin and does have a history of NASH. In the hospital he was diuresis at one point up to Lasix 80 IV 3 times a day and then transitioned to his home dose of Bumex 2 twice a day. He was -8.9 L and his weight reduced from 96-88.5. His metolazone was discontinued.  The patient comes here on Bumex 2 mg twice a day. He is on Lantus 15 units at night here he tells me he was on 25 units at home. His blood sugars appear to be stable. He tells me he has diabetic neuropathy.   BMP Latest Ref Rng 10/10/2015 10/09/2015 10/07/2015  Glucose 65 - 99 mg/dL 105(H) 121(H) 140(H)  BUN 6 - 20 mg/dL 44(H) 32(H) 24(H)  Creatinine 0.61 - 1.24 mg/dL 0.99 0.86 0.88  Sodium 135 - 145 mmol/L 135 137 140  Potassium 3.5 - 5.1 mmol/L 5.1 4.5 4.6  Chloride 101 - 111 mmol/L 98(L) 98(L) 99(L)  CO2 22 - 32 mmol/L 26 29 31   Calcium 8.9 - 10.3 mg/dL 9.4 9.2 9.4   CBC Latest Ref Rng 10/10/2015 10/06/2015 10/05/2015  WBC 4.0 - 10.5 K/uL 6.1 5.7 5.4  Hemoglobin 13.0 - 17.0 g/dL 15.3 14.5 13.7  Hematocrit 39.0 - 52.0 % 45.0 43.3 40.5  Platelets 150 - 400 K/uL 242 246 233    Past Medical History    Diagnosis Date  . COPD (chronic obstructive pulmonary disease) ([place])   . Type 2 diabetes mellitus ([place])   . Hyperlipidemia   . Essential hypertension   . Iron deficiency anemia   . NASH (nonalcoholic steatohepatitis)   . Diabetic neuropathy ([place])   . GERD (gastroesophageal reflux disease)   . Arthritis   . Depression   . Diastolic dysfunction   . Left knee DJD   . Lumbar spinal stenosis   . DDD (degenerative disc disease), lumbosacral   . Peripheral edema    Past Surgical History  Procedure Laterality Date  . Hernia repair    . Shoulder surgery      Rght-rotator cuff  . Lipoma removal      Stomach  . Knee arthroscopy with medial menisectomy Left 11/06/2012    Procedure: KNEE ARTHROSCOPY WITH MEDIAL MENISECTOMY;  Surgeon: Carole Civil, MD;  Location: [facility];  Service: Orthopedics;  Laterality: Left;  . Lumbar laminectomy/decompression microdiscectomy Left 08/30/2013    Procedure: LUMBAR LAMINECTOMY/DECOMPRESSION MICRODISCECTOMY LEFT  LUMBAR TWO THREE;  Surgeon: Otilio Connors, MD;  Location: [place] [facility];  Service: Neurosurgery;  Laterality: Left;  . Egd with enteroscopy  2011    [facility]: normal esophagus and stomach. Normal duodenum,  jejunum. No evidence of AVMs.   . Colonoscopy  2011    [facility]: normal colon, normal distal ileum  . Colonoscopy with propofol N/A 07/21/2014    Procedure: ATTEMPTED COLONOSCOPY WITH PROPOFOL-HAD TO STOP DUE TO BRADYCARDIA;  Surgeon: Daneil Dolin, MD;  Location: [facility];  Service: Endoscopy;  Laterality: N/A;  . Esophagogastroduodenoscopy (egd) with propofol N/A 07/21/2014    Procedure: ESOPHAGOGASTRODUODENOSCOPY (EGD) WITH PROPOFOL;  Surgeon: Daneil Dolin, MD;  Location: [facility];  Service: Endoscopy;  Laterality: N/A;  . Esophageal biopsy N/A 07/21/2014    Procedure: BIOPSY;  Surgeon: Daneil Dolin, MD;  Location: [facility];  Service: Endoscopy;  Laterality: N/A;   Current Outpatient Prescriptions on File Prior to Visit  Medication Sig  Dispense Refill  . ACCU-CHEK AVIVA PLUS test strip     . aspirin EC 81 MG tablet Take 81 mg by mouth daily.    . benazepril (LOTENSIN) 40 MG tablet TAKE ONE TABLET BY MOUTH DAILY. 90 tablet 1  . bumetanide (BUMEX) 2 MG tablet Take 1 tablet (2 mg total) by mouth 2 (two) times daily. 60 tablet 1  . BYSTOLIC 10 MG tablet TAKE ONE TABLET BY MOUTH DAILY. 30 tablet 6  . clobetasol cream (TEMOVATE) 0.05 % APPLY TO AFFECTED AREA 2 TIMES DAILY. 45 g 11  . diazepam (VALIUM) 5 MG tablet Take 1 tablet (5 mg total) by mouth at bedtime as needed for anxiety. 10 tablet 0  . diclofenac sodium (VOLTAREN) 1 % GEL Apply 4 g topically 4 (four) times daily. 5 Tube 5  . Ferrous Sulfate (IRON) 325 (65 FE) MG TABS Take 1 tablet by mouth daily.     Marland Kitchen gabapentin (NEURONTIN) 400 MG capsule TAKE ONE CAPSULE BY MOUTH THREE TIMES DAILY. 90 capsule 3  . glipiZIDE (GLUCOTROL) 10 MG tablet TAKE ONE TABLET BY MOUTH TWICE DAILY BEFORE MEALS. 180 tablet 1  . Insulin Glargine (LANTUS) 100 UNIT/ML Solostar Pen Inject 30 Units into the skin at bedtime. (Patient taking differently: Inject 25 Units into the skin at bedtime. ) 15 mL 11  . levothyroxine (SYNTHROID, LEVOTHROID) 75 MCG tablet Take 1 tablet (75 mcg total) by mouth daily before breakfast. 30 tablet 0  . Magnesium 400 MG TABS Take 400 mg by mouth 2 (two) times daily. 60 tablet 6  . meloxicam (MOBIC) 15 MG tablet Take 15 mg by mouth daily.     . metFORMIN (GLUCOPHAGE) 1000 MG tablet TAKE ONE TABLET BY MOUTH TWICE DAILY WITH A MEAL. 60 tablet 3  . nystatin cream (MYCOSTATIN) Apply 1 application topically 2 (two) times daily. 45 g 1  . omeprazole (PRILOSEC) 20 MG capsule TAKE ONE CAPSULE BY MOUTH TWICE DAILY. 60 capsule 11  . oxyCODONE-acetaminophen (PERCOCET) 7.5-325 MG tablet Take 1 tablet by mouth every 6 (six) hours as needed for severe pain. 10 tablet 0  . potassium chloride (K-DUR) 10 MEQ tablet Take 2 tablets in the AM / Take 1 tablet in the PM 90 tablet 3  . pravastatin  (PRAVACHOL) 40 MG tablet Take 1 tablet (40 mg total) by mouth every evening. 90 tablet 1    Social; patient states he lives in his own apartment. He uses a walker. He is able to give himself his own insulin. He has sisters locally however they are older than him. 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  reports that he quit smoking about 16 years ago. His smoking use included  Cigarettes. He has a 110 pack-year smoking history. He quit smokeless tobacco use about 16 years ago. He reports that he does not drink alcohol or use illicit drugs.  Family History  Problem Relation Age of Onset  . Heart disease Mother   . Hyperlipidemia Mother   . Hypertension Mother   . Depression Mother   . Diabetes Mother   . Rectal cancer Mother   . Cancer Mother   . Heart disease Father   . Hypertension Father   . Hyperlipidemia Father   . Diabetes Father   . Cancer Father   . Heart disease Sister   . Hyperlipidemia Sister   . Hypertension Sister   . Diabetes Sister   . Diabetes Brother   . Heart disease Sister   . Hyperlipidemia Sister   . Hypertension Sister   . Heart disease Sister   . Hyperlipidemia Sister   . Hypertension Sister   . Diabetes Sister   . Diabetes Brother     Review of systems Gen.; patient states his edema is much better HEENT no visual complaints Respiratory no shortness of breath Cardiac no chest pain GI noted for a hernia however he said that they would not repair this at the [place] in [place] because he is asymptomatic. GU no dysuria no hematuria Musculoskeletal; complains of pain and weakness in his knees Neurologic states he has diabetic neuropathy Endocrine able to give his self his own insulin unaware of his hemoglobin A1c Mental status no overt depressive complaints  Physical examination Gen. patient is not in any distress. Able to give a good account of his own history HEENT somewhat of a smooth tongue no  oral lesions. Lymph none palpable in the cervical clavicular axilla re-areas. Respiratory; clear entry bilaterally Cardiac heart sounds are completely normal there is no S3 no murmurs JVP is not elevated Abdomen; somewhat obese with a large ventral hernia above the umbilicus however this reduces easily and is nontender. He has a palpable liver edge several centimeters below the right costal margin. There is no spleen no ascites. He has palmar erythema GU bladder is not distended or tender Musculoskeletal; significant limitation of range of motion of both hips and both knees. He has slight flexion contracture the knees there is no effusion. Degenerative changes in the hands no active synovitis Neurologic; upper extremity strength seems intact. He has markedly reduced strength in hip flexion and abduction barely antigravity. He is diffusely hyporeflexic absent knee jerks compatible with his suggestion he has diabetic neuropathy Mental status; I see no overt abnormality here. When I explained to him about the weakness and pain in his legs and the need for aggressive therapy, he almost broke down and cried. I don't think this man is far away from the wheelchair existence Skin; small superficial wounds on the posterior left thigh  Impression/plan #1 anasarca the exact cause of this was not clearly determined although it was felt to be multifactorial. There is no evidence of heart failure I wonder whether his Karlene Lineman has equal to cirrhosis. He should be checked for proteinuria. Right now he has minimal edema #2 profound immobility and proximal leg weakness probably some combination of pain, degenerative arthritis of the hips and knees and diabetic neuropathy. This will require aggressive therapy #3 significant osteoarthritis of the hips and knees. The patient tells me that he saw orthopedics locally who said he was in no condition for joint replacement. Right now I agree with this #4 type 2 diabetes with  neuropathy.  His BUN and creatinine were normal in the hospital. He should be screened for significant proteinuria #5 nonalcoholic steatohepatitis. I wonder if this is evolved to cirrhosis #6 history of degenerative disc disease in the lumbars sacral area. This may be contributing to his overall immobility. #7 bradycardia in the hospital I believe his by his bystolic was reduced. #8 diastolic congestive heart failure this does not appear to be a current problem #9 venous insufficiency wounds may be was some combination of lymphedema. His leg should be wrapped. I don't believe he requires bilateral Unna boots although the left leg certainly needs to be wrapped

## 2015-10-12 ENCOUNTER — Encounter (HOSPITAL_COMMUNITY)
Admission: AD | Admit: 2015-10-12 | Discharge: 2015-10-12 | Disposition: A | Discharge status: Home / Self Care | Payer: Medicare Other | Source: Skilled Nursing Facility | Attending: Internal Medicine | Admitting: Internal Medicine

## 2015-10-13 ENCOUNTER — Encounter (HOSPITAL_COMMUNITY)
Admission: RE | Admit: 2015-10-13 | Discharge: 2015-10-13 | Disposition: A | Discharge status: Home / Self Care | Payer: Medicare Other | Source: Skilled Nursing Facility | Attending: Internal Medicine | Admitting: Internal Medicine

## 2015-10-13 LAB — BASIC METABOLIC PANEL
ANION GAP: 11 (ref 5–15)
BUN: 63 mg/dL — AB (ref 6–20)
CHLORIDE: 95 mmol/L — AB (ref 101–111)
CO2: 26 mmol/L (ref 22–32)
Calcium: 9.2 mg/dL (ref 8.9–10.3)
Creatinine, Ser: 1.42 mg/dL — ABNORMAL HIGH (ref 0.61–1.24)
GFR calc Af Amer: 57 mL/min — ABNORMAL LOW (ref >60)
GFR, EST NON AFRICAN AMERICAN: 49 mL/min — AB (ref >60)
GLUCOSE: 199 mg/dL — AB (ref 65–99)
POTASSIUM: 5.1 mmol/L (ref 3.5–5.1)
Sodium: 132 mmol/L — ABNORMAL LOW (ref 135–145)

## 2015-10-16 ENCOUNTER — Other Ambulatory Visit: Payer: Self-pay | Admitting: *Deleted

## 2015-10-16 MED ORDER — OXYCODONE-ACETAMINOPHEN 7.5-325 MG PO TABS: ORAL_TABLET | ORAL | Status: DC

## 2015-10-16 NOTE — Telephone Encounter (Signed)
APPT W/ DR ROY 11/03/15 9:15AM

## 2015-10-16 NOTE — Telephone Encounter (Signed)
[facility]

## 2015-10-18 ENCOUNTER — Encounter (HOSPITAL_COMMUNITY)
Admission: RE | Admit: 2015-10-18 | Discharge: 2015-10-18 | Disposition: A | Discharge status: Home / Self Care | Payer: Medicare Other | Source: Skilled Nursing Facility | Attending: Internal Medicine | Admitting: Internal Medicine

## 2015-10-18 LAB — BASIC METABOLIC PANEL
Anion gap: 10 (ref 5–15)
BUN: 45 mg/dL — AB (ref 6–20)
CHLORIDE: 93 mmol/L — AB (ref 101–111)
CO2: 29 mmol/L (ref 22–32)
CREATININE: 1.11 mg/dL (ref 0.61–1.24)
Calcium: 9.4 mg/dL (ref 8.9–10.3)
GFR calc Af Amer: >60 mL/min (ref >60)
GFR calc non Af Amer: >60 mL/min (ref >60)
GLUCOSE: 165 mg/dL — AB (ref 65–99)
Potassium: 5 mmol/L (ref 3.5–5.1)
SODIUM: 132 mmol/L — AB (ref 135–145)

## 2015-10-19 ENCOUNTER — Encounter (HOSPITAL_COMMUNITY)
Admission: AD | Admit: 2015-10-19 | Discharge: 2015-10-19 | Disposition: A | Discharge status: Home / Self Care | Payer: Medicare Other | Source: Skilled Nursing Facility | Attending: Internal Medicine | Admitting: Internal Medicine

## 2015-10-20 ENCOUNTER — Non-Acute Institutional Stay (SKILLED_NURSING_FACILITY): Payer: Medicare Other | Admitting: Internal Medicine

## 2015-10-20 DIAGNOSIS — R635 Abnormal weight gain: Secondary | ICD-10-CM | POA: Diagnosis not present

## 2015-10-20 DIAGNOSIS — I5032 Chronic diastolic (congestive) heart failure: Secondary | ICD-10-CM | POA: Diagnosis not present

## 2015-10-20 NOTE — Progress Notes (Signed)
Patient ID: Michael [name], male   DOB: 06/22/1946, 70 y.o.   MRN: [id number]       Facility; [facility]  This is an acute visit.  Chief complaint-acute visit secondary to weight gain.    HPI--;this is a 70 year old man who  developed progressively increasing edema of his legs below his knees over the last 6 months. Together with pain in his knees this became a difficult ambulatory challenge and he became less and less mobile. He has not been outside his apartment since last summer. He uses a walker. His diagnosis in the hospital was anasarca. He had an echocardiogram that was reasonably normal in terms of left and right ventricular function. He was felt to have diastolic CHF, chronic lymphedema with a history of diastolic heart failure. His albumin was not reduced BUN and creatinine normal. The patient is a type II diabetic on insulin and does have a history of NASH. In the hospital he was diuresis at one point up to Lasix 80 IV 3 times a day and then transitioned to his home dose of Bumex 2 twice a day.  Marland Kitchen His metolazone was discontinued.  The patient comes here on Bumex 2 mg twice a day. Nursing staff has noted a weight gain of about 4 pounds over the past week-per chart review it appears he's had some fluctuating weights however and weight gain of 199.6 today appears to be gaining possibly of a couple pounds over the past 9 days  Per nursing is well familiar with some edema appears relatively at baseline possibly just slightly increased but not precipitously so.  He does not complain of any shortness of breath chest pain or increased weakness from baseline   10/17/2014.  Sodium 132 potassium 5 BUN 45 creatinine 1.11.    BMP Latest Ref Rng 10/10/2015 10/09/2015 10/07/2015  Glucose 65 - 99 mg/dL 105(H) 121(H) 140(H)  BUN 6 - 20 mg/dL 44(H) 32(H) 24(H)  Creatinine 0.61 - 1.24 mg/dL 0.99 0.86 0.88  Sodium 135 - 145 mmol/L 135 137 140  Potassium 3.5 - 5.1 mmol/L 5.1 4.5 4.6  Chloride 101  - 111 mmol/L 98(L) 98(L) 99(L)  CO2 22 - 32 mmol/L 26 29 31   Calcium 8.9 - 10.3 mg/dL 9.4 9.2 9.4   CBC Latest Ref Rng 10/10/2015 10/06/2015 10/05/2015  WBC 4.0 - 10.5 K/uL 6.1 5.7 5.4  Hemoglobin 13.0 - 17.0 g/dL 15.3 14.5 13.7  Hematocrit 39.0 - 52.0 % 45.0 43.3 40.5  Platelets 150 - 400 K/uL 242 246 233    Past Medical History  Diagnosis Date  . COPD (chronic obstructive pulmonary disease) ([place])   . Type 2 diabetes mellitus ([place])   . Hyperlipidemia   . Essential hypertension   . Iron deficiency anemia   . NASH (nonalcoholic steatohepatitis)   . Diabetic neuropathy ([place])   . GERD (gastroesophageal reflux disease)   . Arthritis   . Depression   . Diastolic dysfunction   . Left knee DJD   . Lumbar spinal stenosis   . DDD (degenerative disc disease), lumbosacral   . Peripheral edema    Past Surgical History  Procedure Laterality Date  . Hernia repair    . Shoulder surgery      Rght-rotator cuff  . Lipoma removal      Stomach  . Knee arthroscopy with medial menisectomy Left 11/06/2012    Procedure: KNEE ARTHROSCOPY WITH MEDIAL MENISECTOMY;  Surgeon: Carole Civil, MD;  Location: [facility];  Service: Orthopedics;  Laterality:  Left;  . Lumbar laminectomy/decompression microdiscectomy Left 08/30/2013    Procedure: LUMBAR LAMINECTOMY/DECOMPRESSION MICRODISCECTOMY LEFT  LUMBAR TWO THREE;  Surgeon: Otilio Connors, MD;  Location: [place] [facility];  Service: Neurosurgery;  Laterality: Left;  . Egd with enteroscopy  2011    [facility]: normal esophagus and stomach. Normal duodenum, jejunum. No evidence of AVMs.   . Colonoscopy  2011    [facility]: normal colon, normal distal ileum  . Colonoscopy with propofol N/A 07/21/2014    Procedure: ATTEMPTED COLONOSCOPY WITH PROPOFOL-HAD TO STOP DUE TO BRADYCARDIA;  Surgeon: Daneil Dolin, MD;  Location: [facility];  Service: Endoscopy;  Laterality: N/A;  . Esophagogastroduodenoscopy (egd) with propofol N/A 07/21/2014    Procedure:  ESOPHAGOGASTRODUODENOSCOPY (EGD) WITH PROPOFOL;  Surgeon: Daneil Dolin, MD;  Location: [facility];  Service: Endoscopy;  Laterality: N/A;  . Esophageal biopsy N/A 07/21/2014    Procedure: BIOPSY;  Surgeon: Daneil Dolin, MD;  Location: [facility];  Service: Endoscopy;  Laterality: N/A;   Current Outpatient Prescriptions on File Prior to Visit  Medication Sig Dispense Refill  . ACCU-CHEK AVIVA PLUS test strip     . aspirin EC 81 MG tablet Take 81 mg by mouth daily.    . benazepril (LOTENSIN) 40 MG tablet TAKE ONE TABLET BY MOUTH DAILY. 90 tablet 1  . bumetanide (BUMEX) 2 MG tablet Take 1 tablet (2 mg total) by mouth 2 (two) times daily. 60 tablet 1  . BYSTOLIC 10 MG tablet TAKE ONE TABLET BY MOUTH DAILY. 30 tablet 6  . clobetasol cream (TEMOVATE) 0.05 % APPLY TO AFFECTED AREA 2 TIMES DAILY. 45 g 11  . diazepam (VALIUM) 5 MG tablet Take 1 tablet (5 mg total) by mouth at bedtime as needed for anxiety. 10 tablet 0  . diclofenac sodium (VOLTAREN) 1 % GEL Apply 4 g topically 4 (four) times daily. 5 Tube 5  . Ferrous Sulfate (IRON) 325 (65 FE) MG TABS Take 1 tablet by mouth daily.     Marland Kitchen gabapentin (NEURONTIN) 400 MG capsule TAKE ONE CAPSULE BY MOUTH THREE TIMES DAILY. 90 capsule 3  . glipiZIDE (GLUCOTROL) 10 MG tablet TAKE ONE TABLET BY MOUTH TWICE DAILY BEFORE MEALS. 180 tablet 1  . Insulin Glargine (LANTUS) 100 UNIT/ML Solostar Pen Inject 30 Units into the skin at bedtime. (Patient taking differently: Inject 25 Units into the skin at bedtime. ) 15 mL 11  . levothyroxine (SYNTHROID, LEVOTHROID) 75 MCG tablet Take 1 tablet (75 mcg total) by mouth daily before breakfast. 30 tablet 0  . Magnesium 400 MG TABS Take 400 mg by mouth 2 (two) times daily. 60 tablet 6  . meloxicam (MOBIC) 15 MG tablet Take 15 mg by mouth daily.     . metFORMIN (GLUCOPHAGE) 1000 MG tablet TAKE ONE TABLET BY MOUTH TWICE DAILY WITH A MEAL. 60 tablet 3  . nystatin cream (MYCOSTATIN) Apply 1 application topically 2 (two) times daily. 45  g 1  . omeprazole (PRILOSEC) 20 MG capsule TAKE ONE CAPSULE BY MOUTH TWICE DAILY. 60 capsule 11  . oxyCODONE-acetaminophen (PERCOCET) 7.5-325 MG tablet Take 1 tablet by mouth every 6 (six) hours as needed for severe pain. 10 tablet 0  . potassium chloride (K-DUR) 10 MEQ tablet Take 2 tablets in the AM / Take 1 tablet in the PM 90 tablet 3  . pravastatin (PRAVACHOL) 40 MG tablet Take 1 tablet (40 mg total) by mouth every evening. 90 tablet 1    Social; patient states he lives in  his own apartment. He uses a walker. He is able to give himself his own insulin. He has sisters locally however they are older than him. 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  reports that he quit smoking about 16 years ago. His smoking use included Cigarettes. He has a 110 pack-year smoking history. He quit smokeless tobacco use about 16 years ago. He reports that he does not drink alcohol or use illicit drugs.  Family History  Problem Relation Age of Onset  . Heart disease Mother   . Hyperlipidemia Mother   . Hypertension Mother   . Depression Mother   . Diabetes Mother   . Rectal cancer Mother   . Cancer Mother   . Heart disease Father   . Hypertension Father   . Hyperlipidemia Father   . Diabetes Father   . Cancer Father   . Heart disease Sister   . Hyperlipidemia Sister   . Hypertension Sister   . Diabetes Sister   . Diabetes Brother   . Heart disease Sister   . Hyperlipidemia Sister   . Hypertension Sister   . Heart disease Sister   . Hyperlipidemia Sister   . Hypertension Sister   . Diabetes Sister   . Diabetes Brother     Review of systems Gen.; Does not complain of any fever or chills HEENT no visual complaints Respiratory no shortness of breath Cardiac no chest pain GI noted for a hernia however he said that they would not repair this at the [place] in [place] because he is asymptomatic. GU no dysuria no hematuria Musculoskeletal;  complains of pain and weakness in his knees Neurologic states he has diabetic neuropath Mental status no overt depressive complaints  Physical examination  Temperature 97.4 pulse 80 respirations 24 blood pressure 120/56 weight today 199.6 Gen. patient is not in any distress. Able to give a good account of his own history HEENT somewhat of a smooth tongue no oral lesions.  Respiratory; clear entry bilaterally Cardiac heart sounds --rate and rhythm without murmur gallop or rub JVP is not elevated Abdomen; somewhat obese with a large ventral hernia above the umbilicus however this reduces easily and is nontender.  His left leg is wrapped-right leg he does appear to have some mild edema most noticeable in his feet-per nursing this has increased just slightly certainly not precipitous he does have significant anascara Musculoskeletal; significant limitation of range of motion of both hips and both knees. He has slight flexion contracture the knees there is no effusion. Degenerative changes in the hands no active synovitis Neurologic; upper extremity strength seems intact. Has lower extremity weakness but I do not really appreciate lateralizing findings Mental status; I see no overt abnormality here   Impression/plan #1  He appears to have some mild weight gain although there is scale fluctuation-will write order toweight patient will notify provider of gain greater than 2 pounds at this point continue to monitor clinically he appears to be at baseline does have significant anascara    #2 diastolic congestive heart failure at this point appears relatively stable again his weights will have to be monitored as well as his edema-Will update a metabolic panel next laboratory day-he has been encouraged to limit his fluid intake  #3 venous insufficiency wounds may be was some combination of lymphedema. His leg is wrapped.--Dr. Dellia Nims has assessed this  [phone]

## 2015-10-23 ENCOUNTER — Ambulatory Visit (HOSPITAL_COMMUNITY): Payer: Medicare Other | Admitting: Hematology & Oncology

## 2015-10-23 ENCOUNTER — Encounter (HOSPITAL_COMMUNITY)
Admission: RE | Admit: 2015-10-23 | Discharge: 2015-10-23 | Disposition: A | Discharge status: Home / Self Care | Payer: Medicare Other | Source: Skilled Nursing Facility | Attending: Internal Medicine | Admitting: Internal Medicine

## 2015-10-23 ENCOUNTER — Other Ambulatory Visit (HOSPITAL_COMMUNITY): Payer: Medicare Other

## 2015-10-23 LAB — BASIC METABOLIC PANEL
Anion gap: 9 (ref 5–15)
BUN: 32 mg/dL — ABNORMAL HIGH (ref 6–20)
CHLORIDE: 97 mmol/L — AB (ref 101–111)
CO2: 29 mmol/L (ref 22–32)
CREATININE: 0.99 mg/dL (ref 0.61–1.24)
Calcium: 8.9 mg/dL (ref 8.9–10.3)
Glucose, Bld: 139 mg/dL — ABNORMAL HIGH (ref 65–99)
Potassium: 4.1 mmol/L (ref 3.5–5.1)
SODIUM: 135 mmol/L (ref 135–145)

## 2015-10-24 ENCOUNTER — Ambulatory Visit: Payer: Medicare Other | Admitting: Family Medicine

## 2015-10-28 ENCOUNTER — Non-Acute Institutional Stay (SKILLED_NURSING_FACILITY): Payer: Medicare Other | Admitting: Internal Medicine

## 2015-10-28 ENCOUNTER — Other Ambulatory Visit: Payer: Self-pay | Admitting: Family Medicine

## 2015-10-28 ENCOUNTER — Encounter (HOSPITAL_COMMUNITY)
Admission: RE | Admit: 2015-10-28 | Discharge: 2015-10-28 | Disposition: A | Discharge status: Home / Self Care | Payer: Medicare Other | Source: Skilled Nursing Facility | Attending: Internal Medicine | Admitting: Internal Medicine

## 2015-10-28 ENCOUNTER — Encounter: Payer: Self-pay | Admitting: Internal Medicine

## 2015-10-28 DIAGNOSIS — R601 Generalized edema: Secondary | ICD-10-CM | POA: Diagnosis not present

## 2015-10-28 DIAGNOSIS — M159 Polyosteoarthritis, unspecified: Secondary | ICD-10-CM

## 2015-10-28 DIAGNOSIS — M150 Primary generalized (osteo)arthritis: Secondary | ICD-10-CM | POA: Diagnosis not present

## 2015-10-28 DIAGNOSIS — I5030 Unspecified diastolic (congestive) heart failure: Secondary | ICD-10-CM

## 2015-10-28 DIAGNOSIS — E1149 Type 2 diabetes mellitus with other diabetic neurological complication: Secondary | ICD-10-CM

## 2015-10-28 LAB — BASIC METABOLIC PANEL
Anion gap: 12 (ref 5–15)
BUN: 26 mg/dL — ABNORMAL HIGH (ref 6–20)
CALCIUM: 9.7 mg/dL (ref 8.9–10.3)
CO2: 29 mmol/L (ref 22–32)
CREATININE: 0.88 mg/dL (ref 0.61–1.24)
Chloride: 94 mmol/L — ABNORMAL LOW (ref 101–111)
GFR calc Af Amer: >60 mL/min (ref >60)
GFR calc non Af Amer: >60 mL/min (ref >60)
GLUCOSE: 126 mg/dL — AB (ref 65–99)
Potassium: 4.3 mmol/L (ref 3.5–5.1)
Sodium: 135 mmol/L (ref 135–145)

## 2015-10-28 NOTE — Progress Notes (Signed)
Patient ID: Michael [name], male   DOB: 11/11/1945, 70 y.o.   MRN: [id number]   t        This is a discharge note.  Date is 10/28/2015.  Facility is [facility].  Level care skilled.  Chief complaint-discharge note  HPI ;this is a 70 year old man who te developed progressively increasing edema of his legs below his knees over the last 6 months. Together with pain in his knees this became a difficult ambulatory challenge and he became less and less mobile. He had not been outside his apartment since last summer. He uses a walker. His diagnosis in the hospital was anasarca. He had an echocardiogram that was reasonably normal in terms of left and right ventricular function. He was felt to have diastolic CHF, chronic lymphedema with a history of diastolic heart failure. His albumin was not reduced BUN and creatinine normal. The patient is a type II diabetic on insulin and does have a history of NASH. In the hospital he was diuresis at one point up to Lasix 80 IV 3 times a day and then transitioned to his home dose of Bumex 2 twice a day. He was -8.9 L and his weight reduced from 96-88.5. His metolazone was discontinued. Per weight log--it appears he's gained some weight during his stay here back on Janninth was 195 he's had some mild increased but this appears stabilized over the last few days at around 198-199  The patient ison Bumex 2 mg twice a day.   He is on Lantus 30 units daily at bedtime with a history of type 2 diabetes he is also on Metformin thousand milligrams twice a day and Glucotro  10ml twice a day-per review of blood sugars appears at times she does have lower blood sugars in the 50s although apparently is not overtly symptomatic in fact he was 57 this morning baseline in the morning appears to be from the 80s to low 100s-later in the day there is some variability from the 50s to mid 100s generally-at 4 PM again variability mainly in the lower 100s it appears and this appears  to be the case as well at at bedtime  Currently patient has no complaints he is looking forward to going home he has gained strength during his stay here with therapy-he would benefit from continued therapy at home as well.   . Recent labs.  Gen. 18 2017.  Sodium 135 potassium 4.3 BUN 26 creatinine 0.88.  10/10/2015.  WBC 6.1 hemoglobin 15.3 platelets 242    BMP Latest Ref Rng 10/10/2015 10/09/2015 10/07/2015  Glucose 65 - 99 mg/dL 105(H) 121(H) 140(H)  BUN 6 - 20 mg/dL 44(H) 32(H) 24(H)  Creatinine 0.61 - 1.24 mg/dL 0.99 0.86 0.88  Sodium 135 - 145 mmol/L 135 137 140  Potassium 3.5 - 5.1 mmol/L 5.1 4.5 4.6  Chloride 101 - 111 mmol/L 98(L) 98(L) 99(L)  CO2 22 - 32 mmol/L 26 29 31   Calcium 8.9 - 10.3 mg/dL 9.4 9.2 9.4   CBC Latest Ref Rng 10/10/2015 10/06/2015 10/05/2015  WBC 4.0 - 10.5 K/uL 6.1 5.7 5.4  Hemoglobin 13.0 - 17.0 g/dL 15.3 14.5 13.7  Hematocrit 39.0 - 52.0 % 45.0 43.3 40.5  Platelets 150 - 400 K/uL 242 246 233    Past Medical History  Diagnosis Date  . COPD (chronic obstructive pulmonary disease) ([place]   . Type 2 diabetes mellitus ([place]   . Hyperlipidemia   . Essential hypertension   . Iron deficiency anemia   .  NASH (nonalcoholic steatohepatitis)   . Diabetic neuropathy ([place])   . GERD (gastroesophageal reflux disease)   . Arthritis   . Depression   . Diastolic dysfunction   . Left knee DJD   . Lumbar spinal stenosis   . DDD (degenerative disc disease), lumbosacral   . Peripheral edema    Past Surgical History  Procedure Laterality Date  . Hernia repair    . Shoulder surgery      Rght-rotator cuff  . Lipoma removal      Stomach  . Knee arthroscopy with medial menisectomy Left 11/06/2012    Procedure: KNEE ARTHROSCOPY WITH MEDIAL MENISECTOMY;  Surgeon: Carole Civil, MD;  Location: [facility];  Service: Orthopedics;  Laterality: Left;  . Lumbar laminectomy/decompression microdiscectomy Left 08/30/2013    Procedure: LUMBAR LAMINECTOMY/DECOMPRESSION  MICRODISCECTOMY LEFT  LUMBAR TWO THREE;  Surgeon: Otilio Connors, MD;  Location: [facility];  Service: Neurosurgery;  Laterality: Left;  . Egd with enteroscopy  2011    [facility]: normal esophagus and stomach. Normal duodenum, jejunum. No evidence of AVMs.   . Colonoscopy  2011    [facility]: normal colon, normal distal ileum  . Colonoscopy with propofol N/A 07/21/2014    Procedure: ATTEMPTED COLONOSCOPY WITH PROPOFOL-HAD TO STOP DUE TO BRADYCARDIA;  Surgeon: Daneil Dolin, MD;  Location: [facility];  Service: Endoscopy;  Laterality: N/A;  . Esophagogastroduodenoscopy (egd) with propofol N/A 07/21/2014    Procedure: ESOPHAGOGASTRODUODENOSCOPY (EGD) WITH PROPOFOL;  Surgeon: Daneil Dolin, MD;  Location: [facility];  Service: Endoscopy;  Laterality: N/A;  . Esophageal biopsy N/A 07/21/2014    Procedure: BIOPSY;  Surgeon: Daneil Dolin, MD;  Location: [facility];  Service: Endoscopy;  Laterality: N/A;   Current Outpatient Prescriptions on File Prior to Visit  Medication Sig Dispense Refill  . ACCU-CHEK AVIVA PLUS test strip     . aspirin EC 81 MG tablet Take 81 mg by mouth daily.    . benazepril (LOTENSIN) 40 MG tablet TAKE ONE TABLET BY MOUTH DAILY. 90 tablet 1  . bumetanide (BUMEX) 2 MG tablet Take 1 tablet (2 mg total) by mouth 2 (two) times daily. 60 tablet 1  . BYSTOLIC 10 MG tablet TAKE ONE TABLET BY MOUTH DAILY. 30 tablet 6  . clobetasol cream (TEMOVATE) 0.05 % APPLY TO AFFECTED AREA 2 TIMES DAILY. 45 g 11  . diazepam (VALIUM) 5 MG tablet Take 1 tablet (5 mg total) by mouth at bedtime as needed for anxiety. 10 tablet 0  . diclofenac sodium (VOLTAREN) 1 % GEL Apply 4 g topically 4 (four) times daily. 5 Tube 5  . Ferrous Sulfate (IRON) 325 (65 FE) MG TABS Take 1 tablet by mouth daily.     Marland Kitchen gabapentin (NEURONTIN) 400 MG capsule TAKE ONE CAPSULE BY MOUTH THREE TIMES DAILY. 90 capsule 3  . glipiZIDE (GLUCOTROL) 10 MG tablet TAKE ONE TABLET BY MOUTH TWICE DAILY BEFORE MEALS. 180 tablet 1  . Insulin  Glargine (LANTUS) 100 UNIT/ML Solostar Pen Inject 30 Units into the skin at bedtime. (Patient taking differently: Inject 25 Units into the skin at bedtime. ) 15 mL 11  . levothyroxine (SYNTHROID, LEVOTHROID) 75 MCG tablet Take 1 tablet (75 mcg total) by mouth daily before breakfast. 30 tablet 0  . Magnesium 400 MG TABS Take 400 mg by mouth 2 (two) times daily. 60 tablet 6  . meloxicam (MOBIC) 15 MG tablet Take 15 mg by mouth daily.     . metFORMIN (GLUCOPHAGE) 1000 MG  tablet TAKE ONE TABLET BY MOUTH TWICE DAILY WITH A MEAL. 60 tablet 3  . nystatin cream (MYCOSTATIN) Apply 1 application topically 2 (two) times daily. 45 g 1  . omeprazole (PRILOSEC) 20 MG capsule TAKE ONE CAPSULE BY MOUTH TWICE DAILY. 60 capsule 11  . oxyCODONE-acetaminophen (PERCOCET) 7.5-325 MG tablet Take 1 tablet by mouth every 6 (six) hours as needed for severe pain. 10 tablet 0  . potassium chloride (K-DUR) 10 MEQ tablet Take 2 tablets in the AM / Take 1 tablet in the PM 90 tablet 3  . pravastatin (PRAVACHOL) 40 MG tablet Take 1 tablet (40 mg total) by mouth every evening. 90 tablet 1    Social; patient states he lives in his own apartment. He uses a walker. He is able to give himself his own insulin. He has sisters locally however they are older than him. 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  reports that he quit smoking about 16 years ago. His smoking use included Cigarettes. He has a 110 pack-year smoking history. He quit smokeless tobacco use about 16 years ago. He reports that he does not drink alcohol or use illicit drugs.  Family History  Problem Relation Age of Onset  . Heart disease Mother   . Hyperlipidemia Mother   . Hypertension Mother   . Depression Mother   . Diabetes Mother   . Rectal cancer Mother   . Cancer Mother   . Heart disease Father   . Hypertension Father   . Hyperlipidemia Father   . Diabetes Father   . Cancer Father   . Heart  disease Sister   . Hyperlipidemia Sister   . Hypertension Sister   . Diabetes Sister   . Diabetes Brother   . Heart disease Sister   . Hyperlipidemia Sister   . Hypertension Sister   . Heart disease Sister   . Hyperlipidemia Sister   . Hypertension Sister   . Diabetes Sister   . Diabetes Brother     Review of systems Gen.; patient has stated his edema improved during this hospitalization this appears relatively baseline during his stay here HEENT no visual complaints Respiratory no shortness of breath Cardiac no chest pain GI noted for a hernia however he said that they would not repair this at the [place] in [place] because he is asymptomatic. GU no dysuria no hematuria Musculoskeletal; history off pain and weakness in his knees Neurologic states he has diabetic neuropathy Endocrine able to give his self his own insulin u Mental status no overt depressive complaints--appears to be in good spirits looking forward to going home  Physical examination Temperature is 97.0 pulse 76 respirations 24 blood pressure 106/61 weight is 199.2 Gen. patient is not in any distress. Able to give a good account of his own history sitting in his wheelchair comfortably His skin is warm and dry there is a small scab like area on the back of his left leg this has been followed by wound care does not appear to show signs of infection HEENT somewhat of a smooth tongue no oral lesions. Marland Kitchen Respiratory; clear entry bilaterally Cardiac heart sounds are completely normal there is no S3 no murmurs JVP is not elevated Abdomen; somewhat obese with a large ventral hernia above the umbilicus however this reduces easily and is nontender-bowel sounds are positive i  Musculoskeletal; significant limitation of range of motion of both hips and both knees. He has slight flexion contracture the  knees there is no effusion. Degenerative changes in the hands no active synovitis--continues to have weakness complicated with his  anasarca Neurologic; upper extremity strength seems intact. Continues to have lower extremity weakness as noted above--apparently this has improved somewhat therapy Mental status; Appears pleasant alert  and oriented   Impression/plan #1 anasarca the exact cause of this was not clearly determined although it was felt to be multifactorial--this has remained stable during her stay here he has had some slight weight gain-but not a market increase in weight-would benefit from continued PT and OT-.    #2 profound immobility and proximal leg weakness probably some combination of pain, degenerative arthritis of the hips and knees and diabetic neuropathy. Again he has received therapy although this is a challenging situation #3 significant osteoarthritis of the hips and knees. Patient apparently recently saw orthopedics is thought not be a candidate for replacement at this time which is understandable #4 type 2 diabetes with neuropathy.--CBGs as noted above some concern with the lower blood sugars at times in the 50s-we will discontinue the Glucotrol-this was discussed with Dr. Christoper Fabian need outpatient follow-up this was discussed with patient and family at bedside He does continue on Neurontin with history of neuropathy  5history of degenerative disc disease in the lumbars sacral area. This may be contributing to his overall immobility. #6 bradycardia in the hospital I believe his by his bystolic was reduced--pulses appear to be stable recently in the 60-80 range. #7 diastolic congestive heart failure -at this point appears stable again his weight gain has been fairly minimal #9 venous insufficiency wounds may be was some combination of lymphedema This will require home health follow-up-this is been followed  by Dr. Dellia Nims and wound care in the facility #10-history hypothyroidism he is on supplementation since his stay here was short was not aggressive pursuing this will defer to primary care  provider--I do note hospital January 7 TSH was minimally elevated at 4.708-T4 slightly elevated at 1.16-free T3 was within normal range . [id number] note greater than 30 minutes spent on this discharge summary-greater than 50% of time spent coordinating plan of care for numerous diagnoses

## 2015-10-30 DIAGNOSIS — I110 Hypertensive heart disease with heart failure: Secondary | ICD-10-CM | POA: Diagnosis not present

## 2015-10-30 DIAGNOSIS — Z79891 Long term (current) use of opiate analgesic: Secondary | ICD-10-CM | POA: Diagnosis not present

## 2015-10-30 DIAGNOSIS — L97929 Non-pressure chronic ulcer of unspecified part of left lower leg with unspecified severity: Secondary | ICD-10-CM | POA: Diagnosis not present

## 2015-10-30 DIAGNOSIS — Z7984 Long term (current) use of oral hypoglycemic drugs: Secondary | ICD-10-CM | POA: Diagnosis not present

## 2015-10-30 DIAGNOSIS — E119 Type 2 diabetes mellitus without complications: Secondary | ICD-10-CM | POA: Diagnosis not present

## 2015-10-30 DIAGNOSIS — K7581 Nonalcoholic steatohepatitis (NASH): Secondary | ICD-10-CM | POA: Diagnosis not present

## 2015-10-30 DIAGNOSIS — Z794 Long term (current) use of insulin: Secondary | ICD-10-CM | POA: Diagnosis not present

## 2015-10-30 DIAGNOSIS — J449 Chronic obstructive pulmonary disease, unspecified: Secondary | ICD-10-CM | POA: Diagnosis not present

## 2015-10-30 DIAGNOSIS — I872 Venous insufficiency (chronic) (peripheral): Secondary | ICD-10-CM | POA: Diagnosis not present

## 2015-10-30 DIAGNOSIS — Z4800 Encounter for change or removal of nonsurgical wound dressing: Secondary | ICD-10-CM | POA: Diagnosis not present

## 2015-10-30 DIAGNOSIS — I5030 Unspecified diastolic (congestive) heart failure: Secondary | ICD-10-CM | POA: Diagnosis not present

## 2015-10-30 NOTE — Telephone Encounter (Signed)
Medication refilled per protocol. 

## 2015-10-31 ENCOUNTER — Telehealth: Payer: Self-pay | Admitting: Family Medicine

## 2015-10-31 DIAGNOSIS — Z4800 Encounter for change or removal of nonsurgical wound dressing: Secondary | ICD-10-CM | POA: Diagnosis not present

## 2015-10-31 DIAGNOSIS — Z79891 Long term (current) use of opiate analgesic: Secondary | ICD-10-CM | POA: Diagnosis not present

## 2015-10-31 DIAGNOSIS — I872 Venous insufficiency (chronic) (peripheral): Secondary | ICD-10-CM | POA: Diagnosis not present

## 2015-10-31 DIAGNOSIS — J449 Chronic obstructive pulmonary disease, unspecified: Secondary | ICD-10-CM | POA: Diagnosis not present

## 2015-10-31 DIAGNOSIS — I5030 Unspecified diastolic (congestive) heart failure: Secondary | ICD-10-CM | POA: Diagnosis not present

## 2015-10-31 DIAGNOSIS — K7581 Nonalcoholic steatohepatitis (NASH): Secondary | ICD-10-CM | POA: Diagnosis not present

## 2015-10-31 DIAGNOSIS — I110 Hypertensive heart disease with heart failure: Secondary | ICD-10-CM | POA: Diagnosis not present

## 2015-10-31 DIAGNOSIS — Z7984 Long term (current) use of oral hypoglycemic drugs: Secondary | ICD-10-CM | POA: Diagnosis not present

## 2015-10-31 DIAGNOSIS — E119 Type 2 diabetes mellitus without complications: Secondary | ICD-10-CM | POA: Diagnosis not present

## 2015-10-31 DIAGNOSIS — L97929 Non-pressure chronic ulcer of unspecified part of left lower leg with unspecified severity: Secondary | ICD-10-CM | POA: Diagnosis not present

## 2015-10-31 DIAGNOSIS — Z794 Long term (current) use of insulin: Secondary | ICD-10-CM | POA: Diagnosis not present

## 2015-10-31 NOTE — Telephone Encounter (Signed)
Call placed to Amy. [place].

## 2015-10-31 NOTE — Telephone Encounter (Signed)
Karn Cassis, PT with [facility] called for verbal orders for pt to have a medical social worker  [phone]

## 2015-11-01 DIAGNOSIS — Z4800 Encounter for change or removal of nonsurgical wound dressing: Secondary | ICD-10-CM | POA: Diagnosis not present

## 2015-11-01 DIAGNOSIS — E119 Type 2 diabetes mellitus without complications: Secondary | ICD-10-CM | POA: Diagnosis not present

## 2015-11-01 DIAGNOSIS — Z79891 Long term (current) use of opiate analgesic: Secondary | ICD-10-CM | POA: Diagnosis not present

## 2015-11-01 DIAGNOSIS — I890 Lymphedema, not elsewhere classified: Secondary | ICD-10-CM

## 2015-11-01 DIAGNOSIS — L97929 Non-pressure chronic ulcer of unspecified part of left lower leg with unspecified severity: Secondary | ICD-10-CM | POA: Diagnosis not present

## 2015-11-01 DIAGNOSIS — J449 Chronic obstructive pulmonary disease, unspecified: Secondary | ICD-10-CM | POA: Diagnosis not present

## 2015-11-01 DIAGNOSIS — I5030 Unspecified diastolic (congestive) heart failure: Secondary | ICD-10-CM | POA: Diagnosis not present

## 2015-11-01 DIAGNOSIS — K7581 Nonalcoholic steatohepatitis (NASH): Secondary | ICD-10-CM | POA: Diagnosis not present

## 2015-11-01 DIAGNOSIS — I872 Venous insufficiency (chronic) (peripheral): Secondary | ICD-10-CM | POA: Diagnosis not present

## 2015-11-01 DIAGNOSIS — Z794 Long term (current) use of insulin: Secondary | ICD-10-CM | POA: Diagnosis not present

## 2015-11-01 DIAGNOSIS — I110 Hypertensive heart disease with heart failure: Secondary | ICD-10-CM | POA: Diagnosis not present

## 2015-11-01 DIAGNOSIS — Z7984 Long term (current) use of oral hypoglycemic drugs: Secondary | ICD-10-CM | POA: Diagnosis not present

## 2015-11-01 HISTORY — DX: Lymphedema, not elsewhere classified: I89.0

## 2015-11-02 ENCOUNTER — Encounter (HOSPITAL_COMMUNITY): Payer: Self-pay | Admitting: Emergency Medicine

## 2015-11-02 ENCOUNTER — Emergency Department (HOSPITAL_COMMUNITY)
Admission: EM | Admit: 2015-11-02 | Discharge: 2015-11-02 | Disposition: A | Discharge status: Home / Self Care | Payer: Medicare Other | Attending: Emergency Medicine | Admitting: Emergency Medicine

## 2015-11-02 DIAGNOSIS — I872 Venous insufficiency (chronic) (peripheral): Secondary | ICD-10-CM | POA: Diagnosis not present

## 2015-11-02 DIAGNOSIS — D509 Iron deficiency anemia, unspecified: Secondary | ICD-10-CM | POA: Diagnosis not present

## 2015-11-02 DIAGNOSIS — Z794 Long term (current) use of insulin: Secondary | ICD-10-CM | POA: Insufficient documentation

## 2015-11-02 DIAGNOSIS — J449 Chronic obstructive pulmonary disease, unspecified: Secondary | ICD-10-CM | POA: Diagnosis not present

## 2015-11-02 DIAGNOSIS — I110 Hypertensive heart disease with heart failure: Secondary | ICD-10-CM | POA: Diagnosis not present

## 2015-11-02 DIAGNOSIS — K219 Gastro-esophageal reflux disease without esophagitis: Secondary | ICD-10-CM | POA: Insufficient documentation

## 2015-11-02 DIAGNOSIS — F329 Major depressive disorder, single episode, unspecified: Secondary | ICD-10-CM | POA: Insufficient documentation

## 2015-11-02 DIAGNOSIS — E1140 Type 2 diabetes mellitus with diabetic neuropathy, unspecified: Secondary | ICD-10-CM | POA: Insufficient documentation

## 2015-11-02 DIAGNOSIS — Z79899 Other long term (current) drug therapy: Secondary | ICD-10-CM | POA: Diagnosis not present

## 2015-11-02 DIAGNOSIS — N390 Urinary tract infection, site not specified: Secondary | ICD-10-CM | POA: Insufficient documentation

## 2015-11-02 DIAGNOSIS — E785 Hyperlipidemia, unspecified: Secondary | ICD-10-CM | POA: Insufficient documentation

## 2015-11-02 DIAGNOSIS — L97929 Non-pressure chronic ulcer of unspecified part of left lower leg with unspecified severity: Secondary | ICD-10-CM | POA: Diagnosis not present

## 2015-11-02 DIAGNOSIS — Z7984 Long term (current) use of oral hypoglycemic drugs: Secondary | ICD-10-CM | POA: Insufficient documentation

## 2015-11-02 DIAGNOSIS — Z791 Long term (current) use of non-steroidal anti-inflammatories (NSAID): Secondary | ICD-10-CM | POA: Insufficient documentation

## 2015-11-02 DIAGNOSIS — Z79891 Long term (current) use of opiate analgesic: Secondary | ICD-10-CM | POA: Diagnosis not present

## 2015-11-02 DIAGNOSIS — I5030 Unspecified diastolic (congestive) heart failure: Secondary | ICD-10-CM | POA: Diagnosis not present

## 2015-11-02 DIAGNOSIS — M1990 Unspecified osteoarthritis, unspecified site: Secondary | ICD-10-CM | POA: Diagnosis not present

## 2015-11-02 DIAGNOSIS — Z7982 Long term (current) use of aspirin: Secondary | ICD-10-CM | POA: Diagnosis not present

## 2015-11-02 DIAGNOSIS — E119 Type 2 diabetes mellitus without complications: Secondary | ICD-10-CM | POA: Diagnosis not present

## 2015-11-02 DIAGNOSIS — Z7952 Long term (current) use of systemic steroids: Secondary | ICD-10-CM | POA: Insufficient documentation

## 2015-11-02 DIAGNOSIS — K7581 Nonalcoholic steatohepatitis (NASH): Secondary | ICD-10-CM | POA: Diagnosis not present

## 2015-11-02 DIAGNOSIS — Z87891 Personal history of nicotine dependence: Secondary | ICD-10-CM | POA: Insufficient documentation

## 2015-11-02 DIAGNOSIS — Z4800 Encounter for change or removal of nonsurgical wound dressing: Secondary | ICD-10-CM | POA: Diagnosis not present

## 2015-11-02 DIAGNOSIS — R300 Dysuria: Secondary | ICD-10-CM | POA: Diagnosis present

## 2015-11-02 DIAGNOSIS — I10 Essential (primary) hypertension: Secondary | ICD-10-CM | POA: Insufficient documentation

## 2015-11-02 LAB — URINALYSIS, ROUTINE W REFLEX MICROSCOPIC
Bilirubin Urine: NEGATIVE
Glucose, UA: NEGATIVE mg/dL
Ketones, ur: NEGATIVE mg/dL
NITRITE: NEGATIVE
PH: 7.5 (ref 5.0–8.0)
Protein, ur: 30 mg/dL — AB
SPECIFIC GRAVITY, URINE: 1.01 (ref 1.005–1.030)

## 2015-11-02 LAB — URINE MICROSCOPIC-ADD ON

## 2015-11-02 MED ORDER — ONDANSETRON HCL 4 MG PO TABS
4.0000 mg | ORAL_TABLET | Freq: Once | ORAL | Status: AC
  Administered 2015-11-02: 4 mg via ORAL
  Filled 2015-11-02: qty 1

## 2015-11-02 MED ORDER — PHENAZOPYRIDINE HCL 100 MG PO TABS
100.0000 mg | ORAL_TABLET | Freq: Once | ORAL | Status: AC
  Administered 2015-11-02: 100 mg via ORAL
  Filled 2015-11-02: qty 1

## 2015-11-02 MED ORDER — CIPROFLOXACIN HCL 250 MG PO TABS
500.0000 mg | ORAL_TABLET | Freq: Once | ORAL | Status: AC
  Administered 2015-11-02: 500 mg via ORAL
  Filled 2015-11-02: qty 2

## 2015-11-02 MED ORDER — CIPROFLOXACIN HCL 500 MG PO TABS: 500.0000 mg | ORAL_TABLET | Freq: Two times a day (BID) | ORAL | Status: DC

## 2015-11-02 MED ORDER — PHENAZOPYRIDINE HCL 100 MG PO TABS: 100.0000 mg | ORAL_TABLET | Freq: Three times a day (TID) | ORAL | Status: DC | PRN

## 2015-11-02 MED ORDER — CEPHALEXIN 500 MG PO CAPS
500.0000 mg | ORAL_CAPSULE | Freq: Once | ORAL | Status: AC
  Administered 2015-11-02: 500 mg via ORAL
  Filled 2015-11-02: qty 1

## 2015-11-02 NOTE — Discharge Instructions (Signed)
Your urine tests suggest urinary tract infection. Please use Cipro 2 times daily with a meal until all taken. Please use for radium 3 times daily with a meal until all taken for urinary discomfort. Please see your primary physician within the next 7-10 days for recheck of your urine. Please see your physician, or return to the emergency department immediately if any high fevers, nausea vomiting, unusual weakness, or deterioration in your general condition. Urinary Tract Infection Urinary tract infections (UTIs) can develop anywhere along your urinary tract. Your urinary tract is your body's drainage system for removing wastes and extra water. Your urinary tract includes two kidneys, two ureters, a bladder, and a urethra. Your kidneys are a pair of bean-shaped organs. Each kidney is about the size of your fist. They are located below your ribs, one on each side of your spine. CAUSES Infections are caused by microbes, which are microscopic organisms, including fungi, viruses, and bacteria. These organisms are so small that they can only be seen through a microscope. Bacteria are the microbes that most commonly cause UTIs. SYMPTOMS  Symptoms of UTIs may vary by age and gender of the patient and by the location of the infection. Symptoms in young women typically include a frequent and intense urge to urinate and a painful, burning feeling in the bladder or urethra during urination. Older women and men are more likely to be tired, shaky, and weak and have muscle aches and abdominal pain. A fever may mean the infection is in your kidneys. Other symptoms of a kidney infection include pain in your back or sides below the ribs, nausea, and vomiting. DIAGNOSIS To diagnose a UTI, your caregiver will ask you about your symptoms. Your caregiver will also ask you to provide a urine sample. The urine sample will be tested for bacteria and white blood cells. White blood cells are made by your body to help fight  infection. TREATMENT  Typically, UTIs can be treated with medication. Because most UTIs are caused by a bacterial infection, they usually can be treated with the use of antibiotics. The choice of antibiotic and length of treatment depend on your symptoms and the type of bacteria causing your infection. HOME CARE INSTRUCTIONS  If you were prescribed antibiotics, take them exactly as your caregiver instructs you. Finish the medication even if you feel better after you have only taken some of the medication.  Drink enough water and fluids to keep your urine clear or pale yellow.  Avoid caffeine, tea, and carbonated beverages. They tend to irritate your bladder.  Empty your bladder often. Avoid holding urine for long periods of time.  Empty your bladder before and after sexual intercourse.  After a bowel movement, women should cleanse from front to back. Use each tissue only once. SEEK MEDICAL CARE IF:   You have back pain.  You develop a fever.  Your symptoms do not begin to resolve within 3 days. SEEK IMMEDIATE MEDICAL CARE IF:   You have severe back pain or lower abdominal pain.  You develop chills.  You have nausea or vomiting.  You have continued burning or discomfort with urination. MAKE SURE YOU:   Understand these instructions.  Will watch your condition.  Will get help right away if you are not doing well or get worse.   This information is not intended to replace advice given to you by your health care provider. Make sure you discuss any questions you have with your health care provider.   Document Released:  06/26/2005 Document Revised: 06/07/2015 Document Reviewed: 10/25/2011 Elsevier Interactive Patient Education [employer].

## 2015-11-02 NOTE — ED Provider Notes (Signed)
CSN: [id number]     Arrival date & time 11/02/15  1544 History   First MD Initiated Contact with Patient 11/02/15 1629     Chief Complaint  Patient presents with  . Dysuria     (Consider location/radiation/quality/duration/timing/severity/associated sxs/prior Treatment) HPI Comments: Patient is a 70 year old male who presents to the emergency department with a complaint of dysuria.  The patient states that he was hospital for about 10 days, at which time he received a Foley catheter for short period of time. He states he has since that time been in the [place] [facility] , but has not received a catheterization in that facility. On yesterday he began having increased urine frequency, as well as burning sensation with urination. He has not seen blood in the urine. There's been no high fevers noted. No nausea, no vomiting, no shaking chills reported. The patient has not had any previous problems with his prostate gland. He states that his prostate-specific antigens have been tested on a regular basis, and have been within normal limits. He has not had any operations or procedures involving the urethra, or bladder.  Patient is a 70 y.o. male presenting with dysuria. The history is provided by the patient.  Dysuria This is a new problem. The current episode started yesterday. Associated symptoms include arthralgias. Pertinent negatives include no chills, fatigue or fever.    Past Medical History  Diagnosis Date  . COPD (chronic obstructive pulmonary disease) ([place])   . Type 2 diabetes mellitus ([place])   . Hyperlipidemia   . Essential hypertension   . Iron deficiency anemia   . NASH (nonalcoholic steatohepatitis)   . Diabetic neuropathy ([place])   . GERD (gastroesophageal reflux disease)   . Arthritis   . Depression   . Diastolic dysfunction   . Left knee DJD   . Lumbar spinal stenosis   . DDD (degenerative disc disease), lumbosacral   . Peripheral edema    Past Surgical History  Procedure  Laterality Date  . Hernia repair    . Shoulder surgery      Rght-rotator cuff  . Lipoma removal      Stomach  . Knee arthroscopy with medial menisectomy Left 11/06/2012    Procedure: KNEE ARTHROSCOPY WITH MEDIAL MENISECTOMY;  Surgeon: Carole Civil, MD;  Location: [facility];  Service: Orthopedics;  Laterality: Left;  . Lumbar laminectomy/decompression microdiscectomy Left 08/30/2013    Procedure: LUMBAR LAMINECTOMY/DECOMPRESSION MICRODISCECTOMY LEFT  LUMBAR TWO THREE;  Surgeon: Otilio Connors, MD;  Location: [place] [facility];  Service: Neurosurgery;  Laterality: Left;  . Egd with enteroscopy  2011    [facility]: normal esophagus and stomach. Normal duodenum, jejunum. No evidence of AVMs.   . Colonoscopy  2011    [facility]: normal colon, normal distal ileum  . Colonoscopy with propofol N/A 07/21/2014    Procedure: ATTEMPTED COLONOSCOPY WITH PROPOFOL-HAD TO STOP DUE TO BRADYCARDIA;  Surgeon: Daneil Dolin, MD;  Location: [facility];  Service: Endoscopy;  Laterality: N/A;  . Esophagogastroduodenoscopy (egd) with propofol N/A 07/21/2014    Procedure: ESOPHAGOGASTRODUODENOSCOPY (EGD) WITH PROPOFOL;  Surgeon: Daneil Dolin, MD;  Location: [facility];  Service: Endoscopy;  Laterality: N/A;  . Biopsy N/A 07/21/2014    Procedure: BIOPSY;  Surgeon: Daneil Dolin, MD;  Location: [facility];  Service: Endoscopy;  Laterality: N/A;   Family History  Problem Relation Age of Onset  . Heart disease Mother   . Hyperlipidemia Mother   . Hypertension Mother   . Depression Mother   .  Diabetes Mother   . Rectal cancer Mother   . Cancer Mother   . Heart disease Father   . Hypertension Father   . Hyperlipidemia Father   . Diabetes Father   . Cancer Father   . Heart disease Sister   . Hyperlipidemia Sister   . Hypertension Sister   . Diabetes Sister   . Diabetes Brother   . Heart disease Sister   . Hyperlipidemia Sister   . Hypertension Sister   . Heart disease Sister   . Hyperlipidemia Sister   . Hypertension  Sister   . Diabetes Sister   . Diabetes Brother    Social History  Substance Use Topics  . Smoking status: Former Smoker -- 2.50 packs/day for 44 years    Types: Cigarettes    Quit date: 10/01/1999  . Smokeless tobacco: Former Systems developer    Quit date: 10/01/1999  . Alcohol Use: No    Review of Systems  Constitutional: Negative for fever, chills and fatigue.  Genitourinary: Positive for dysuria and frequency.  Musculoskeletal: Positive for back pain and arthralgias.  All other systems reviewed and are negative.     Allergies  Review of patient's allergies indicates no known allergies.  Home Medications   Prior to Admission medications   Medication Sig Start Date End Date Taking? Authorizing Provider  ACCU-CHEK AVIVA PLUS test strip  04/10/15   Historical Provider, MD  aspirin EC 81 MG tablet Take 81 mg by mouth daily.    Historical Provider, MD  benazepril (LOTENSIN) 40 MG tablet TAKE ONE TABLET BY MOUTH DAILY. 04/11/15   [name], MD  bumetanide (BUMEX) 2 MG tablet Take 1 tablet (2 mg total) by mouth 2 (two) times daily. 06/12/15   [name], MD  BYSTOLIC 10 MG tablet TAKE ONE TABLET BY MOUTH DAILY. 08/02/15   [name], MD  clobetasol cream (TEMOVATE) 0.05 % APPLY TO AFFECTED AREA 2 TIMES DAILY. 06/06/15   [name], MD  diazepam (VALIUM) 5 MG tablet Take 1 tablet (5 mg total) by mouth at bedtime as needed for anxiety. 10/09/15   Nita Sells, MD  diclofenac sodium (VOLTAREN) 1 % GEL Apply 4 g topically 4 (four) times daily. 08/31/14   Carole Civil, MD  Ferrous Sulfate (IRON) 325 (65 FE) MG TABS Take 1 tablet by mouth daily.     Historical Provider, MD  gabapentin (NEURONTIN) 400 MG capsule TAKE ONE CAPSULE BY MOUTH THREE TIMES DAILY. 09/06/15   [name], MD  Insulin Glargine (LANTUS) 100 UNIT/ML Solostar Pen Inject 30 Units into the skin at bedtime. Patient taking differently: Inject 25 Units into the skin at bedtime.  10/12/14   [name], MD  levothyroxine (SYNTHROID, LEVOTHROID) 75 MCG tablet Take 1 tablet (75 mcg total) by mouth daily before breakfast. 10/09/15   Nita Sells, MD  Magnesium 400 MG TABS Take 400 mg by mouth 2 (two) times daily. 05/31/15   Lendon Colonel, NP  meloxicam (MOBIC) 15 MG tablet Take 15 mg by mouth daily.  04/05/14   Historical Provider, MD  metFORMIN (GLUCOPHAGE) 1000 MG tablet TAKE ONE TABLET BY MOUTH TWICE DAILY WITH A MEAL. 09/06/15   [name], MD  nystatin cream (MYCOSTATIN) Apply 1 application topically 2 (two) times daily. 12/23/14   [name], MD  omeprazole (PRILOSEC) 20 MG capsule TAKE ONE CAPSULE BY MOUTH TWICE DAILY. 03/13/15   [name], MD  oxyCODONE-acetaminophen (PERCOCET) 7.5-325 MG tablet  Take one tablet by mouth every 6 hours as needed for severe pain. Max APAP 3gm/24hrs from all sources 10/16/15   Tiffany L Reed, DO  potassium chloride (K-DUR) 10 MEQ tablet Take 2 tablets in the AM / Take 1 tablet in the PM Patient taking differently: 20 mEq daily. Take 2 tablets in the AM / Take 1 tablet in the PM 05/08/15   Lendon Colonel, NP  pravastatin (PRAVACHOL) 40 MG tablet TAKE ONE TABLET BY MOUTH DAILY. 10/30/15   [name], MD   BP 159/71 mmHg  Pulse 98  Temp(Src) 98.2 F (36.8 C) (Oral)  Resp 20  SpO2 95% Physical Exam  Constitutional: He is oriented to person, place, and time. He appears well-developed and well-nourished.  Non-toxic appearance.  HENT:  Head: Normocephalic.  Right Ear: Tympanic membrane and external ear normal.  Left Ear: Tympanic membrane and external ear normal.  Eyes: EOM and lids are normal. Pupils are equal, round, and reactive to light.  Neck: Normal range of motion. Neck supple. Carotid bruit is not present.  Cardiovascular: Normal rate, regular rhythm, normal heart sounds, intact distal pulses and normal pulses.   Pulmonary/Chest: Breath sounds normal. No respiratory distress.  Abdominal: Soft. Bowel sounds are  normal. There is tenderness in the suprapubic area. There is no guarding and no CVA tenderness.  No CVAT.  Musculoskeletal: Normal range of motion.  Lymphadenopathy:       Head (right side): No submandibular adenopathy present.       Head (left side): No submandibular adenopathy present.    He has no cervical adenopathy.  Neurological: He is alert and oriented to person, place, and time. He has normal strength. No cranial nerve deficit or sensory deficit.  Skin: Skin is warm and dry.  Psychiatric: He has a normal mood and affect. His speech is normal.  Nursing note and vitals reviewed.   ED Course  Procedures (including critical care time) Labs Review Labs Reviewed  URINE CULTURE  URINALYSIS, ROUTINE W REFLEX MICROSCOPIC (NOT AT [facility])    Imaging Review No results found. I have personally reviewed and evaluated these images and lab results as part of my medical decision-making.   EKG Interpretation None      MDM  Urinalysis reveals a large hemoglobin, 30 mg/daL of protein, large leukocyte esterase, too many to count white blood cells, 6-30 red blood cells, and many bacteria. A culture has been sent to the lab.  The patient will be treated with Cipro and Pyridium. The patient is to have the urine rechecked in 7-10 days. Given he and his family instructions to return immediately if any fever, unusual back pain, nausea vomiting, or signs of advancing infection.    Final diagnoses:  UTI (lower urinary tract infection)    *I have reviewed nursing notes, vital signs, and all appropriate lab and imaging results for this patient.[address] PA-C 11/02/15 1754  Noemi Chapel, MD 11/02/15 [phone]

## 2015-11-02 NOTE — ED Notes (Signed)
Pt states that yesterday he started having burning with urination.

## 2015-11-03 DIAGNOSIS — I872 Venous insufficiency (chronic) (peripheral): Secondary | ICD-10-CM | POA: Diagnosis not present

## 2015-11-03 DIAGNOSIS — E119 Type 2 diabetes mellitus without complications: Secondary | ICD-10-CM | POA: Diagnosis not present

## 2015-11-03 DIAGNOSIS — Z7984 Long term (current) use of oral hypoglycemic drugs: Secondary | ICD-10-CM | POA: Diagnosis not present

## 2015-11-03 DIAGNOSIS — Z794 Long term (current) use of insulin: Secondary | ICD-10-CM | POA: Diagnosis not present

## 2015-11-03 DIAGNOSIS — J449 Chronic obstructive pulmonary disease, unspecified: Secondary | ICD-10-CM | POA: Diagnosis not present

## 2015-11-03 DIAGNOSIS — I110 Hypertensive heart disease with heart failure: Secondary | ICD-10-CM | POA: Diagnosis not present

## 2015-11-03 DIAGNOSIS — Z79891 Long term (current) use of opiate analgesic: Secondary | ICD-10-CM | POA: Diagnosis not present

## 2015-11-03 DIAGNOSIS — K7581 Nonalcoholic steatohepatitis (NASH): Secondary | ICD-10-CM | POA: Diagnosis not present

## 2015-11-03 DIAGNOSIS — L97929 Non-pressure chronic ulcer of unspecified part of left lower leg with unspecified severity: Secondary | ICD-10-CM | POA: Diagnosis not present

## 2015-11-03 DIAGNOSIS — I5030 Unspecified diastolic (congestive) heart failure: Secondary | ICD-10-CM | POA: Diagnosis not present

## 2015-11-03 DIAGNOSIS — Z4800 Encounter for change or removal of nonsurgical wound dressing: Secondary | ICD-10-CM | POA: Diagnosis not present

## 2015-11-03 NOTE — Telephone Encounter (Signed)
Received call from Amy with [facility].   Reports that patient was discharged from SNF or 10/31/2015.  Requested VO for Medical SW for community resources.   VO given.   MD to be made aware.

## 2015-11-03 NOTE — Telephone Encounter (Signed)
Agree with above 

## 2015-11-03 NOTE — Telephone Encounter (Signed)
Call placed to patient. LMTRC.  

## 2015-11-05 LAB — URINE CULTURE: Culture: >=100000

## 2015-11-06 ENCOUNTER — Telehealth (HOSPITAL_BASED_OUTPATIENT_CLINIC_OR_DEPARTMENT_OTHER): Payer: Self-pay | Admitting: Emergency Medicine

## 2015-11-06 DIAGNOSIS — I5030 Unspecified diastolic (congestive) heart failure: Secondary | ICD-10-CM | POA: Diagnosis not present

## 2015-11-06 DIAGNOSIS — J449 Chronic obstructive pulmonary disease, unspecified: Secondary | ICD-10-CM | POA: Diagnosis not present

## 2015-11-06 DIAGNOSIS — K7581 Nonalcoholic steatohepatitis (NASH): Secondary | ICD-10-CM | POA: Diagnosis not present

## 2015-11-06 DIAGNOSIS — L97929 Non-pressure chronic ulcer of unspecified part of left lower leg with unspecified severity: Secondary | ICD-10-CM | POA: Diagnosis not present

## 2015-11-06 DIAGNOSIS — I872 Venous insufficiency (chronic) (peripheral): Secondary | ICD-10-CM | POA: Diagnosis not present

## 2015-11-06 DIAGNOSIS — E119 Type 2 diabetes mellitus without complications: Secondary | ICD-10-CM | POA: Diagnosis not present

## 2015-11-06 DIAGNOSIS — Z4800 Encounter for change or removal of nonsurgical wound dressing: Secondary | ICD-10-CM | POA: Diagnosis not present

## 2015-11-06 DIAGNOSIS — I110 Hypertensive heart disease with heart failure: Secondary | ICD-10-CM | POA: Diagnosis not present

## 2015-11-06 DIAGNOSIS — Z79891 Long term (current) use of opiate analgesic: Secondary | ICD-10-CM | POA: Diagnosis not present

## 2015-11-06 DIAGNOSIS — Z794 Long term (current) use of insulin: Secondary | ICD-10-CM | POA: Diagnosis not present

## 2015-11-06 DIAGNOSIS — Z7984 Long term (current) use of oral hypoglycemic drugs: Secondary | ICD-10-CM | POA: Diagnosis not present

## 2015-11-06 NOTE — Telephone Encounter (Signed)
Post ED Visit - Positive Culture Follow-up  Culture report reviewed by antimicrobial stewardship pharmacist:  [x]  Elenor Quinones, Pharm.D. []  Heide Guile, Pharm.D., BCPS []  Parks Neptune, Pharm.D. []  [name], Pharm.D., BCPS []  [place], [place].D., BCPS, AAHIVP []  Legrand Como, Pharm.D., BCPS, AAHIVP []  Milus Glazier, Pharm.D. []  Stephens [place], [place].D.  Positive urine culture Proteus Treated with ciprofloxacin, organism sensitive to the same and no further patient follow-up is required at this time.  Hazle Nordmann 11/06/2015, 9:04 AM

## 2015-11-07 ENCOUNTER — Encounter: Payer: Self-pay | Admitting: Family Medicine

## 2015-11-07 ENCOUNTER — Ambulatory Visit (INDEPENDENT_AMBULATORY_CARE_PROVIDER_SITE_OTHER): Payer: Medicare Other | Admitting: Family Medicine

## 2015-11-07 VITALS — BP 138/78 | HR 72 | Temp 98.1°F | Resp 16

## 2015-11-07 DIAGNOSIS — R262 Difficulty in walking, not elsewhere classified: Secondary | ICD-10-CM

## 2015-11-07 DIAGNOSIS — I890 Lymphedema, not elsewhere classified: Secondary | ICD-10-CM

## 2015-11-07 DIAGNOSIS — I5030 Unspecified diastolic (congestive) heart failure: Secondary | ICD-10-CM

## 2015-11-07 DIAGNOSIS — E1149 Type 2 diabetes mellitus with other diabetic neurological complication: Secondary | ICD-10-CM

## 2015-11-07 DIAGNOSIS — E038 Other specified hypothyroidism: Secondary | ICD-10-CM | POA: Diagnosis not present

## 2015-11-07 DIAGNOSIS — I10 Essential (primary) hypertension: Secondary | ICD-10-CM

## 2015-11-07 LAB — HEMOGLOBIN A1C, FINGERSTICK: HEMOGLOBIN A1C, FINGERSTICK: 6.4 % — AB (ref <5.7)

## 2015-11-07 MED ORDER — METFORMIN HCL 1000 MG PO TABS: ORAL_TABLET | ORAL | Status: DC

## 2015-11-07 MED ORDER — MELOXICAM 15 MG PO TABS: 15.0000 mg | ORAL_TABLET | Freq: Every day | ORAL | Status: DC

## 2015-11-07 MED ORDER — OXYCODONE-ACETAMINOPHEN 7.5-325 MG PO TABS: ORAL_TABLET | ORAL | Status: DC

## 2015-11-07 NOTE — Addendum Note (Signed)
Addended by: Sheral Flow on: 11/07/2015 04:50 PM   Modules accepted: Orders

## 2015-11-07 NOTE — Assessment & Plan Note (Signed)
Blood pressure looks okay no change in medication

## 2015-11-07 NOTE — Assessment & Plan Note (Signed)
This is multifactorial from his ostomy arthritis of his knees is degenerative disc disease in his lumbar spine the chronic lymphedema his overall deconditioned state. He does need help at home. [facility] still has him on a waiting list C does not have Medicaid services and is unable to pay for services out of pocket. Hopefully his insurance program will have something where he can get a nurse for 35 hours a week as a stated over the phone. We'll also continue with his home health aide and his physical therapy. My concern is that if he loses any more mobility and does not try to get out of the wheelchair at all that he will need to be in a nursing facility

## 2015-11-07 NOTE — Assessment & Plan Note (Signed)
Reviewed by her function studies he was acutely ill and this time there was minimal elevation in his TSH no change with medication today I will recheck this in his visit in 6 weeks

## 2015-11-07 NOTE — Assessment & Plan Note (Signed)
Decrease his Lantus to 28 units as a 30 units was causing hypoglycemia. He will continue the metformin

## 2015-11-07 NOTE — Patient Instructions (Addendum)
We will help set up services Continue current medications Decrease lantus to 28 units  F/U 6 weeks

## 2015-11-07 NOTE — Assessment & Plan Note (Addendum)
Continue Bumex. The previous wound has now resolved. He does need significant care in his home. Also needs to work on trying to keep himself as mobile as possible. If he is bedbound and he needs to go back to the nursing home. We will extend his physical therapy Mariea Stable get the nurse aide to continue with bathing. We'll try to work with his insurance company see if we can get him covered for personal care services to assist him with mealtime cleaning etc.  Cardiology follow-up to be established

## 2015-11-07 NOTE — Progress Notes (Signed)
Patient ID: Michael [name], male   DOB: 18-Dec-1945, 70 y.o.   MRN: [id number]    Subjective:    Patient ID: Michael [name], male    DOB: 09/24/1946, 70 y.o.   MRN: [id number]  Patient presents for SNF Discharge  Here for follow-up after discharge from skilled nursing facility. He was admitted secondary to anasarca. He also had a wound on his left lower extremity. That is now more salt. He spent 21 days in the nursing home and has been discharged home with some home health services. He requires more assistance at home and trying to find a way to get her nursing be with him most of the day. He also requires assistance with bathing and was preparing his meals. He is here today with a family friend. Unfortunately his apartment is not handicap assessable. He needs a few items for the home. Including a lift chair and accommodations for his bathroom. He does have a ramp into his home.  He was seen in the emergency room one week after discharge from skilled nursing facility for urinary tract infection he's completed his antibiotics and his symptoms have resolved.  He is due for follow-up with cardiology he is willing to resume care he is currently on Demadex for his diuretic controlled. He had some renal insufficiency with a metallic sound therefore this was discontinued.  Diabetes mellitus his A1c today in the office 6.4% and some hypoglycemia while he was given 30 units in the nursing home he is still taking the metformin as prescribed.  Niece [name] POA  Lives [address] Purnell Shoemaker Office [phone]   He is quite upset today as he was told by one of the physicians that he would never walk again and that he would be bed bound.  Review Of Systems:  GEN- denies fatigue, fever, weight loss,weakness, recent illness HEENT- denies eye drainage, change in vision, nasal discharge, CVS- denies chest pain, palpitations RESP- denies SOB, cough, wheeze ABD- denies N/V, change in stools,  abd pain GU- denies dysuria, hematuria, dribbling, incontinence MSK- +joint pain, muscle aches, injury Neuro- denies headache, dizziness, syncope, seizure activity       Objective:    BP 138/78 mmHg  Pulse 72  Temp(Src) 98.1 F (36.7 C) (Oral)  Resp 16 GEN- NAD, alert and oriented x3, sitting in wheelchair  HEENT- PERRL, EOMI, non injected sclera, pink conjunctiva, MMM, oropharynx clear Neck- Supple, no thyromegaly CVS- RRR, no murmur RESP-CTAB ABD-NABS,soft,NT,ND EXT- chronic non pitting edema to shins bilat, ballooning over feet, no open lesions, dry skin  Pulses- Radial 2+        Assessment & Plan:      Problem List Items Addressed This Visit    Type II diabetes mellitus with neurological manifestations ([place])   Relevant Orders   Hemoglobin A1C, fingerstick (Completed)   Lymphedema   Essential hypertension - Primary      Note: This dictation was prepared with Dragon dictation along with smaller phrase technology. Any transcriptional errors that result from this process are unintentional.

## 2015-11-08 DIAGNOSIS — Z794 Long term (current) use of insulin: Secondary | ICD-10-CM | POA: Diagnosis not present

## 2015-11-08 DIAGNOSIS — I5030 Unspecified diastolic (congestive) heart failure: Secondary | ICD-10-CM | POA: Diagnosis not present

## 2015-11-08 DIAGNOSIS — K7581 Nonalcoholic steatohepatitis (NASH): Secondary | ICD-10-CM | POA: Diagnosis not present

## 2015-11-08 DIAGNOSIS — L97929 Non-pressure chronic ulcer of unspecified part of left lower leg with unspecified severity: Secondary | ICD-10-CM | POA: Diagnosis not present

## 2015-11-08 DIAGNOSIS — Z79891 Long term (current) use of opiate analgesic: Secondary | ICD-10-CM | POA: Diagnosis not present

## 2015-11-08 DIAGNOSIS — Z4800 Encounter for change or removal of nonsurgical wound dressing: Secondary | ICD-10-CM | POA: Diagnosis not present

## 2015-11-08 DIAGNOSIS — I872 Venous insufficiency (chronic) (peripheral): Secondary | ICD-10-CM | POA: Diagnosis not present

## 2015-11-08 DIAGNOSIS — E119 Type 2 diabetes mellitus without complications: Secondary | ICD-10-CM | POA: Diagnosis not present

## 2015-11-08 DIAGNOSIS — Z7984 Long term (current) use of oral hypoglycemic drugs: Secondary | ICD-10-CM | POA: Diagnosis not present

## 2015-11-08 DIAGNOSIS — I110 Hypertensive heart disease with heart failure: Secondary | ICD-10-CM | POA: Diagnosis not present

## 2015-11-08 DIAGNOSIS — J449 Chronic obstructive pulmonary disease, unspecified: Secondary | ICD-10-CM | POA: Diagnosis not present

## 2015-11-09 DIAGNOSIS — K7581 Nonalcoholic steatohepatitis (NASH): Secondary | ICD-10-CM | POA: Diagnosis not present

## 2015-11-09 DIAGNOSIS — I872 Venous insufficiency (chronic) (peripheral): Secondary | ICD-10-CM | POA: Diagnosis not present

## 2015-11-09 DIAGNOSIS — Z7984 Long term (current) use of oral hypoglycemic drugs: Secondary | ICD-10-CM | POA: Diagnosis not present

## 2015-11-09 DIAGNOSIS — Z794 Long term (current) use of insulin: Secondary | ICD-10-CM | POA: Diagnosis not present

## 2015-11-09 DIAGNOSIS — L97929 Non-pressure chronic ulcer of unspecified part of left lower leg with unspecified severity: Secondary | ICD-10-CM | POA: Diagnosis not present

## 2015-11-09 DIAGNOSIS — J449 Chronic obstructive pulmonary disease, unspecified: Secondary | ICD-10-CM | POA: Diagnosis not present

## 2015-11-09 DIAGNOSIS — Z4800 Encounter for change or removal of nonsurgical wound dressing: Secondary | ICD-10-CM | POA: Diagnosis not present

## 2015-11-09 DIAGNOSIS — E119 Type 2 diabetes mellitus without complications: Secondary | ICD-10-CM | POA: Diagnosis not present

## 2015-11-09 DIAGNOSIS — I5030 Unspecified diastolic (congestive) heart failure: Secondary | ICD-10-CM | POA: Diagnosis not present

## 2015-11-09 DIAGNOSIS — I110 Hypertensive heart disease with heart failure: Secondary | ICD-10-CM | POA: Diagnosis not present

## 2015-11-09 DIAGNOSIS — Z79891 Long term (current) use of opiate analgesic: Secondary | ICD-10-CM | POA: Diagnosis not present

## 2015-11-10 ENCOUNTER — Telehealth: Payer: Self-pay | Admitting: Family Medicine

## 2015-11-10 DIAGNOSIS — Z7984 Long term (current) use of oral hypoglycemic drugs: Secondary | ICD-10-CM | POA: Diagnosis not present

## 2015-11-10 DIAGNOSIS — I872 Venous insufficiency (chronic) (peripheral): Secondary | ICD-10-CM | POA: Diagnosis not present

## 2015-11-10 DIAGNOSIS — Z794 Long term (current) use of insulin: Secondary | ICD-10-CM | POA: Diagnosis not present

## 2015-11-10 DIAGNOSIS — E119 Type 2 diabetes mellitus without complications: Secondary | ICD-10-CM | POA: Diagnosis not present

## 2015-11-10 DIAGNOSIS — Z4800 Encounter for change or removal of nonsurgical wound dressing: Secondary | ICD-10-CM | POA: Diagnosis not present

## 2015-11-10 DIAGNOSIS — I5030 Unspecified diastolic (congestive) heart failure: Secondary | ICD-10-CM | POA: Diagnosis not present

## 2015-11-10 DIAGNOSIS — I110 Hypertensive heart disease with heart failure: Secondary | ICD-10-CM | POA: Diagnosis not present

## 2015-11-10 DIAGNOSIS — L97929 Non-pressure chronic ulcer of unspecified part of left lower leg with unspecified severity: Secondary | ICD-10-CM | POA: Diagnosis not present

## 2015-11-10 DIAGNOSIS — Z79891 Long term (current) use of opiate analgesic: Secondary | ICD-10-CM | POA: Diagnosis not present

## 2015-11-10 DIAGNOSIS — K7581 Nonalcoholic steatohepatitis (NASH): Secondary | ICD-10-CM | POA: Diagnosis not present

## 2015-11-10 DIAGNOSIS — J449 Chronic obstructive pulmonary disease, unspecified: Secondary | ICD-10-CM | POA: Diagnosis not present

## 2015-11-10 NOTE — Telephone Encounter (Signed)
-----   Message from [place], LPN sent at 10/05/1759  2:04 PM EST ----- Regarding: RE: Multiple things 1.  Call placed to [facility] [phone]). Was advised that patient plan does cover [place] [facility] for <8 hours per day or <35 hours per week if the patient is homebound (has extreme difficulty leaving home). Program pays if [facility] A[facility] is through certified agency like [place].   2.  Call placed to Purnell Shoemaker in regards to apartment accomodations. States that she will need to further look into what is required for handicap accessible sinks. She will get back in touch with office.        Also inquired of Rose about program for assistance in rent due to [facility] assistance. States that the [place] government has dropped this program, so there is no assistance that can be given.   3.  Call placed to Lemmie Evens, RN with [facility]. VO given to extend [place] for PT and HH Adie.   4.  Call placed to [facility]. Was advised that [place] will pay for lift aparatus in chair, but not for the actual recliner. Dx needs to be in relation to hips or knees for medicare to cover. Patient states that he would still like it to be ordered so that he can see how much it is. Order placed on your desk.  5.  Call placed to Dr. Nelly Laurence office. Appointment scheduled with Dr. Harl Bowie for 11/29/2015 @ 8:40am. Patient made aware.     ----- Message -----    From: [name], MD    Sent: 11/07/2015   1:56 PM      To: [name], MD, [name], LPN Subject: Multiple things                                 1.Call  [employer]- his insurance, they state they have a program that will cover 35 hours of nursing, he does not have Medicaid, So He wound qualify for PCS. Not sure if he qualifies for CAP either  2. Call his Apt manager- Purnell [phone], he needs accommodations for handicap accessible sink in bathroom and kitchen, also she states we can write a letter to reducer his rent due to him needing an aide??  3.  Call [facility]- extend his Nurse Aid for baths 3X  Week   Extend PT- work up upper body strength, ability to use walker   4. Send script for Lift chair to [employer] - Dx Gait instability, OA, DDD spine   5.  Call and get cardiology f/u with Dr. Harl Bowie only

## 2015-11-10 NOTE — Telephone Encounter (Signed)
----- Message from [name], LPN sent at 1/97/5883  4:02 PM EST ----- Regarding: RE: Multiple things Call placed to [facility] on behalf of patient.   UHC requirements taht are not met is that pt have SN services for [place] as well. Patient does not have a skilled need, therefore does not qualify for [facility] aide.   Patient aware.  ----- Message -----    From: [name], MD    Sent: 11/10/2015   7:50 AM      To: [name], LPN Subject: FW: Multiple things                            Go ahead and set up [facility] aide for the 6-8 hours, this however dose not sound like PCS services, more of what he is already getting but see if [facility] can provide more hours for him.   Let him know about the apartment not having the program he can go back to discuss with them ----- Message -----    From: [name], LPN    Sent: 11/04/4980   2:04 PM      To: [name], MD Subject: RE: Multiple things                            1.  Call placed to [facility] [phone]). Was advised that patient plan does cover [place] Adie for <8 hours per day or <35 hours per week if the patient is homebound (has extreme difficulty leaving home). Program pays if [facility] Aide is through certified agency like [place].   2.  Call placed to Purnell Shoemaker in regards to apartment accomodations. States that she will need to further look into what is required for handicap accessible sinks. She will get back in touch with office.        Also inquired of Rose about program for assistance in rent due to [facility] assistance. States that the [place] government has dropped this program, so there is no assistance that can be given.   3.  Call placed to Lemmie Evens, RN with [facility]. VO given to extend [place] for PT and HH Adie.   4.  Call placed to [facility]. Was advised that [place] will pay for lift aparatus in chair, but not for the actual recliner. Dx needs to be in relation to hips or knees for medicare to cover. Patient states  that he would still like it to be ordered so that he can see how much it is. Order placed on your desk.  5.  Call placed to Dr. Nelly Laurence office. Appointment scheduled with Dr. Harl Bowie for 11/29/2015 @ 8:40am. Patient made aware.     ----- Message -----    From: [name], MD    Sent: 11/07/2015   1:56 PM      To: [name], MD, [name], LPN Subject: Multiple things                                 1.Call  [employer]- his insurance, they state they have a program that will cover 35 hours of nursing, he does not have Medicaid, So He wound qualify for PCS. Not sure if he qualifies for CAP either  2. Call his Apt manager- Purnell [phone], he needs accommodations  for handicap accessible sink in bathroom and kitchen, also she states we can write a letter to reducer his rent due to him needing an aide??  3. Call [facility]- extend his Nurse Aid for baths 3X  Week   Extend PT- work up upper body strength, ability to use walker   4. Send script for Lift chair to [employer] - Dx Gait instability, OA, DDD spine   5.  Call and get cardiology f/u with Dr. Harl Bowie only

## 2015-11-13 DIAGNOSIS — J449 Chronic obstructive pulmonary disease, unspecified: Secondary | ICD-10-CM | POA: Diagnosis not present

## 2015-11-13 DIAGNOSIS — Z794 Long term (current) use of insulin: Secondary | ICD-10-CM | POA: Diagnosis not present

## 2015-11-13 DIAGNOSIS — Z4800 Encounter for change or removal of nonsurgical wound dressing: Secondary | ICD-10-CM | POA: Diagnosis not present

## 2015-11-13 DIAGNOSIS — Z7984 Long term (current) use of oral hypoglycemic drugs: Secondary | ICD-10-CM | POA: Diagnosis not present

## 2015-11-13 DIAGNOSIS — K7581 Nonalcoholic steatohepatitis (NASH): Secondary | ICD-10-CM | POA: Diagnosis not present

## 2015-11-13 DIAGNOSIS — I5030 Unspecified diastolic (congestive) heart failure: Secondary | ICD-10-CM | POA: Diagnosis not present

## 2015-11-13 DIAGNOSIS — I872 Venous insufficiency (chronic) (peripheral): Secondary | ICD-10-CM | POA: Diagnosis not present

## 2015-11-13 DIAGNOSIS — E119 Type 2 diabetes mellitus without complications: Secondary | ICD-10-CM | POA: Diagnosis not present

## 2015-11-13 DIAGNOSIS — I110 Hypertensive heart disease with heart failure: Secondary | ICD-10-CM | POA: Diagnosis not present

## 2015-11-13 DIAGNOSIS — L97929 Non-pressure chronic ulcer of unspecified part of left lower leg with unspecified severity: Secondary | ICD-10-CM | POA: Diagnosis not present

## 2015-11-13 DIAGNOSIS — Z79891 Long term (current) use of opiate analgesic: Secondary | ICD-10-CM | POA: Diagnosis not present

## 2015-11-14 DIAGNOSIS — E119 Type 2 diabetes mellitus without complications: Secondary | ICD-10-CM | POA: Diagnosis not present

## 2015-11-14 DIAGNOSIS — K7581 Nonalcoholic steatohepatitis (NASH): Secondary | ICD-10-CM | POA: Diagnosis not present

## 2015-11-14 DIAGNOSIS — I5030 Unspecified diastolic (congestive) heart failure: Secondary | ICD-10-CM | POA: Diagnosis not present

## 2015-11-14 DIAGNOSIS — L97929 Non-pressure chronic ulcer of unspecified part of left lower leg with unspecified severity: Secondary | ICD-10-CM | POA: Diagnosis not present

## 2015-11-14 DIAGNOSIS — I110 Hypertensive heart disease with heart failure: Secondary | ICD-10-CM | POA: Diagnosis not present

## 2015-11-14 DIAGNOSIS — Z4800 Encounter for change or removal of nonsurgical wound dressing: Secondary | ICD-10-CM | POA: Diagnosis not present

## 2015-11-14 DIAGNOSIS — Z79891 Long term (current) use of opiate analgesic: Secondary | ICD-10-CM | POA: Diagnosis not present

## 2015-11-14 DIAGNOSIS — I872 Venous insufficiency (chronic) (peripheral): Secondary | ICD-10-CM | POA: Diagnosis not present

## 2015-11-14 DIAGNOSIS — J449 Chronic obstructive pulmonary disease, unspecified: Secondary | ICD-10-CM | POA: Diagnosis not present

## 2015-11-14 DIAGNOSIS — Z7984 Long term (current) use of oral hypoglycemic drugs: Secondary | ICD-10-CM | POA: Diagnosis not present

## 2015-11-14 DIAGNOSIS — Z794 Long term (current) use of insulin: Secondary | ICD-10-CM | POA: Diagnosis not present

## 2015-11-15 DIAGNOSIS — J449 Chronic obstructive pulmonary disease, unspecified: Secondary | ICD-10-CM | POA: Diagnosis not present

## 2015-11-15 DIAGNOSIS — Z794 Long term (current) use of insulin: Secondary | ICD-10-CM | POA: Diagnosis not present

## 2015-11-15 DIAGNOSIS — L89312 Pressure ulcer of right buttock, stage 2: Secondary | ICD-10-CM | POA: Diagnosis not present

## 2015-11-15 DIAGNOSIS — Z79891 Long term (current) use of opiate analgesic: Secondary | ICD-10-CM | POA: Diagnosis not present

## 2015-11-15 DIAGNOSIS — Z4800 Encounter for change or removal of nonsurgical wound dressing: Secondary | ICD-10-CM | POA: Diagnosis not present

## 2015-11-15 DIAGNOSIS — I110 Hypertensive heart disease with heart failure: Secondary | ICD-10-CM | POA: Diagnosis not present

## 2015-11-15 DIAGNOSIS — E119 Type 2 diabetes mellitus without complications: Secondary | ICD-10-CM | POA: Diagnosis not present

## 2015-11-15 DIAGNOSIS — I5030 Unspecified diastolic (congestive) heart failure: Secondary | ICD-10-CM | POA: Diagnosis not present

## 2015-11-15 DIAGNOSIS — L97929 Non-pressure chronic ulcer of unspecified part of left lower leg with unspecified severity: Secondary | ICD-10-CM | POA: Diagnosis not present

## 2015-11-15 DIAGNOSIS — K7581 Nonalcoholic steatohepatitis (NASH): Secondary | ICD-10-CM | POA: Diagnosis not present

## 2015-11-15 DIAGNOSIS — Z7984 Long term (current) use of oral hypoglycemic drugs: Secondary | ICD-10-CM | POA: Diagnosis not present

## 2015-11-15 DIAGNOSIS — I872 Venous insufficiency (chronic) (peripheral): Secondary | ICD-10-CM | POA: Diagnosis not present

## 2015-11-16 DIAGNOSIS — L97929 Non-pressure chronic ulcer of unspecified part of left lower leg with unspecified severity: Secondary | ICD-10-CM | POA: Diagnosis not present

## 2015-11-16 DIAGNOSIS — Z79891 Long term (current) use of opiate analgesic: Secondary | ICD-10-CM | POA: Diagnosis not present

## 2015-11-16 DIAGNOSIS — K7581 Nonalcoholic steatohepatitis (NASH): Secondary | ICD-10-CM | POA: Diagnosis not present

## 2015-11-16 DIAGNOSIS — Z4800 Encounter for change or removal of nonsurgical wound dressing: Secondary | ICD-10-CM | POA: Diagnosis not present

## 2015-11-16 DIAGNOSIS — J449 Chronic obstructive pulmonary disease, unspecified: Secondary | ICD-10-CM | POA: Diagnosis not present

## 2015-11-16 DIAGNOSIS — I5030 Unspecified diastolic (congestive) heart failure: Secondary | ICD-10-CM | POA: Diagnosis not present

## 2015-11-16 DIAGNOSIS — Z7984 Long term (current) use of oral hypoglycemic drugs: Secondary | ICD-10-CM | POA: Diagnosis not present

## 2015-11-16 DIAGNOSIS — I110 Hypertensive heart disease with heart failure: Secondary | ICD-10-CM | POA: Diagnosis not present

## 2015-11-16 DIAGNOSIS — E119 Type 2 diabetes mellitus without complications: Secondary | ICD-10-CM | POA: Diagnosis not present

## 2015-11-16 DIAGNOSIS — I872 Venous insufficiency (chronic) (peripheral): Secondary | ICD-10-CM | POA: Diagnosis not present

## 2015-11-16 DIAGNOSIS — Z794 Long term (current) use of insulin: Secondary | ICD-10-CM | POA: Diagnosis not present

## 2015-11-17 ENCOUNTER — Other Ambulatory Visit: Payer: Self-pay | Admitting: Family Medicine

## 2015-11-17 DIAGNOSIS — Z794 Long term (current) use of insulin: Secondary | ICD-10-CM | POA: Diagnosis not present

## 2015-11-17 DIAGNOSIS — I5030 Unspecified diastolic (congestive) heart failure: Secondary | ICD-10-CM | POA: Diagnosis not present

## 2015-11-17 DIAGNOSIS — I110 Hypertensive heart disease with heart failure: Secondary | ICD-10-CM | POA: Diagnosis not present

## 2015-11-17 DIAGNOSIS — Z7984 Long term (current) use of oral hypoglycemic drugs: Secondary | ICD-10-CM | POA: Diagnosis not present

## 2015-11-17 DIAGNOSIS — Z79891 Long term (current) use of opiate analgesic: Secondary | ICD-10-CM | POA: Diagnosis not present

## 2015-11-17 DIAGNOSIS — L97929 Non-pressure chronic ulcer of unspecified part of left lower leg with unspecified severity: Secondary | ICD-10-CM | POA: Diagnosis not present

## 2015-11-17 DIAGNOSIS — Z4800 Encounter for change or removal of nonsurgical wound dressing: Secondary | ICD-10-CM | POA: Diagnosis not present

## 2015-11-17 DIAGNOSIS — I872 Venous insufficiency (chronic) (peripheral): Secondary | ICD-10-CM | POA: Diagnosis not present

## 2015-11-17 DIAGNOSIS — J449 Chronic obstructive pulmonary disease, unspecified: Secondary | ICD-10-CM | POA: Diagnosis not present

## 2015-11-17 DIAGNOSIS — E119 Type 2 diabetes mellitus without complications: Secondary | ICD-10-CM | POA: Diagnosis not present

## 2015-11-17 DIAGNOSIS — K7581 Nonalcoholic steatohepatitis (NASH): Secondary | ICD-10-CM | POA: Diagnosis not present

## 2015-11-17 NOTE — Telephone Encounter (Signed)
Refill appropriate and filled per protocol. 

## 2015-11-20 DIAGNOSIS — K7581 Nonalcoholic steatohepatitis (NASH): Secondary | ICD-10-CM | POA: Diagnosis not present

## 2015-11-20 DIAGNOSIS — Z7984 Long term (current) use of oral hypoglycemic drugs: Secondary | ICD-10-CM | POA: Diagnosis not present

## 2015-11-20 DIAGNOSIS — Z4800 Encounter for change or removal of nonsurgical wound dressing: Secondary | ICD-10-CM | POA: Diagnosis not present

## 2015-11-20 DIAGNOSIS — L97929 Non-pressure chronic ulcer of unspecified part of left lower leg with unspecified severity: Secondary | ICD-10-CM | POA: Diagnosis not present

## 2015-11-20 DIAGNOSIS — J449 Chronic obstructive pulmonary disease, unspecified: Secondary | ICD-10-CM | POA: Diagnosis not present

## 2015-11-20 DIAGNOSIS — I110 Hypertensive heart disease with heart failure: Secondary | ICD-10-CM | POA: Diagnosis not present

## 2015-11-20 DIAGNOSIS — Z794 Long term (current) use of insulin: Secondary | ICD-10-CM | POA: Diagnosis not present

## 2015-11-20 DIAGNOSIS — Z79891 Long term (current) use of opiate analgesic: Secondary | ICD-10-CM | POA: Diagnosis not present

## 2015-11-20 DIAGNOSIS — I872 Venous insufficiency (chronic) (peripheral): Secondary | ICD-10-CM | POA: Diagnosis not present

## 2015-11-20 DIAGNOSIS — E119 Type 2 diabetes mellitus without complications: Secondary | ICD-10-CM | POA: Diagnosis not present

## 2015-11-20 DIAGNOSIS — I5030 Unspecified diastolic (congestive) heart failure: Secondary | ICD-10-CM | POA: Diagnosis not present

## 2015-11-21 DIAGNOSIS — I5030 Unspecified diastolic (congestive) heart failure: Secondary | ICD-10-CM | POA: Diagnosis not present

## 2015-11-21 DIAGNOSIS — Z7984 Long term (current) use of oral hypoglycemic drugs: Secondary | ICD-10-CM | POA: Diagnosis not present

## 2015-11-21 DIAGNOSIS — Z79891 Long term (current) use of opiate analgesic: Secondary | ICD-10-CM | POA: Diagnosis not present

## 2015-11-21 DIAGNOSIS — I110 Hypertensive heart disease with heart failure: Secondary | ICD-10-CM | POA: Diagnosis not present

## 2015-11-21 DIAGNOSIS — K7581 Nonalcoholic steatohepatitis (NASH): Secondary | ICD-10-CM | POA: Diagnosis not present

## 2015-11-21 DIAGNOSIS — E119 Type 2 diabetes mellitus without complications: Secondary | ICD-10-CM | POA: Diagnosis not present

## 2015-11-21 DIAGNOSIS — Z794 Long term (current) use of insulin: Secondary | ICD-10-CM | POA: Diagnosis not present

## 2015-11-21 DIAGNOSIS — Z4800 Encounter for change or removal of nonsurgical wound dressing: Secondary | ICD-10-CM | POA: Diagnosis not present

## 2015-11-21 DIAGNOSIS — I872 Venous insufficiency (chronic) (peripheral): Secondary | ICD-10-CM | POA: Diagnosis not present

## 2015-11-21 DIAGNOSIS — L97929 Non-pressure chronic ulcer of unspecified part of left lower leg with unspecified severity: Secondary | ICD-10-CM | POA: Diagnosis not present

## 2015-11-21 DIAGNOSIS — J449 Chronic obstructive pulmonary disease, unspecified: Secondary | ICD-10-CM | POA: Diagnosis not present

## 2015-11-22 DIAGNOSIS — L97929 Non-pressure chronic ulcer of unspecified part of left lower leg with unspecified severity: Secondary | ICD-10-CM | POA: Diagnosis not present

## 2015-11-22 DIAGNOSIS — Z79891 Long term (current) use of opiate analgesic: Secondary | ICD-10-CM | POA: Diagnosis not present

## 2015-11-22 DIAGNOSIS — Z794 Long term (current) use of insulin: Secondary | ICD-10-CM | POA: Diagnosis not present

## 2015-11-22 DIAGNOSIS — J449 Chronic obstructive pulmonary disease, unspecified: Secondary | ICD-10-CM | POA: Diagnosis not present

## 2015-11-22 DIAGNOSIS — Z4800 Encounter for change or removal of nonsurgical wound dressing: Secondary | ICD-10-CM | POA: Diagnosis not present

## 2015-11-22 DIAGNOSIS — I872 Venous insufficiency (chronic) (peripheral): Secondary | ICD-10-CM | POA: Diagnosis not present

## 2015-11-22 DIAGNOSIS — Z7984 Long term (current) use of oral hypoglycemic drugs: Secondary | ICD-10-CM | POA: Diagnosis not present

## 2015-11-22 DIAGNOSIS — E119 Type 2 diabetes mellitus without complications: Secondary | ICD-10-CM | POA: Diagnosis not present

## 2015-11-22 DIAGNOSIS — K7581 Nonalcoholic steatohepatitis (NASH): Secondary | ICD-10-CM | POA: Diagnosis not present

## 2015-11-22 DIAGNOSIS — I110 Hypertensive heart disease with heart failure: Secondary | ICD-10-CM | POA: Diagnosis not present

## 2015-11-22 DIAGNOSIS — I5030 Unspecified diastolic (congestive) heart failure: Secondary | ICD-10-CM | POA: Diagnosis not present

## 2015-11-23 DIAGNOSIS — Z794 Long term (current) use of insulin: Secondary | ICD-10-CM | POA: Diagnosis not present

## 2015-11-23 DIAGNOSIS — L97929 Non-pressure chronic ulcer of unspecified part of left lower leg with unspecified severity: Secondary | ICD-10-CM | POA: Diagnosis not present

## 2015-11-23 DIAGNOSIS — Z79891 Long term (current) use of opiate analgesic: Secondary | ICD-10-CM | POA: Diagnosis not present

## 2015-11-23 DIAGNOSIS — Z7984 Long term (current) use of oral hypoglycemic drugs: Secondary | ICD-10-CM | POA: Diagnosis not present

## 2015-11-23 DIAGNOSIS — K7581 Nonalcoholic steatohepatitis (NASH): Secondary | ICD-10-CM | POA: Diagnosis not present

## 2015-11-23 DIAGNOSIS — E119 Type 2 diabetes mellitus without complications: Secondary | ICD-10-CM | POA: Diagnosis not present

## 2015-11-23 DIAGNOSIS — Z4800 Encounter for change or removal of nonsurgical wound dressing: Secondary | ICD-10-CM | POA: Diagnosis not present

## 2015-11-23 DIAGNOSIS — I872 Venous insufficiency (chronic) (peripheral): Secondary | ICD-10-CM | POA: Diagnosis not present

## 2015-11-23 DIAGNOSIS — I110 Hypertensive heart disease with heart failure: Secondary | ICD-10-CM | POA: Diagnosis not present

## 2015-11-23 DIAGNOSIS — I5030 Unspecified diastolic (congestive) heart failure: Secondary | ICD-10-CM | POA: Diagnosis not present

## 2015-11-23 DIAGNOSIS — J449 Chronic obstructive pulmonary disease, unspecified: Secondary | ICD-10-CM | POA: Diagnosis not present

## 2015-11-24 DIAGNOSIS — L97929 Non-pressure chronic ulcer of unspecified part of left lower leg with unspecified severity: Secondary | ICD-10-CM | POA: Diagnosis not present

## 2015-11-24 DIAGNOSIS — Z794 Long term (current) use of insulin: Secondary | ICD-10-CM | POA: Diagnosis not present

## 2015-11-24 DIAGNOSIS — I872 Venous insufficiency (chronic) (peripheral): Secondary | ICD-10-CM | POA: Diagnosis not present

## 2015-11-24 DIAGNOSIS — Z7984 Long term (current) use of oral hypoglycemic drugs: Secondary | ICD-10-CM | POA: Diagnosis not present

## 2015-11-24 DIAGNOSIS — J449 Chronic obstructive pulmonary disease, unspecified: Secondary | ICD-10-CM | POA: Diagnosis not present

## 2015-11-24 DIAGNOSIS — K7581 Nonalcoholic steatohepatitis (NASH): Secondary | ICD-10-CM | POA: Diagnosis not present

## 2015-11-24 DIAGNOSIS — Z4800 Encounter for change or removal of nonsurgical wound dressing: Secondary | ICD-10-CM | POA: Diagnosis not present

## 2015-11-24 DIAGNOSIS — Z79891 Long term (current) use of opiate analgesic: Secondary | ICD-10-CM | POA: Diagnosis not present

## 2015-11-24 DIAGNOSIS — I110 Hypertensive heart disease with heart failure: Secondary | ICD-10-CM | POA: Diagnosis not present

## 2015-11-24 DIAGNOSIS — E119 Type 2 diabetes mellitus without complications: Secondary | ICD-10-CM | POA: Diagnosis not present

## 2015-11-24 DIAGNOSIS — I5030 Unspecified diastolic (congestive) heart failure: Secondary | ICD-10-CM | POA: Diagnosis not present

## 2015-11-27 DIAGNOSIS — Z794 Long term (current) use of insulin: Secondary | ICD-10-CM | POA: Diagnosis not present

## 2015-11-27 DIAGNOSIS — Z79891 Long term (current) use of opiate analgesic: Secondary | ICD-10-CM | POA: Diagnosis not present

## 2015-11-27 DIAGNOSIS — I872 Venous insufficiency (chronic) (peripheral): Secondary | ICD-10-CM | POA: Diagnosis not present

## 2015-11-27 DIAGNOSIS — I110 Hypertensive heart disease with heart failure: Secondary | ICD-10-CM | POA: Diagnosis not present

## 2015-11-27 DIAGNOSIS — K7581 Nonalcoholic steatohepatitis (NASH): Secondary | ICD-10-CM | POA: Diagnosis not present

## 2015-11-27 DIAGNOSIS — E119 Type 2 diabetes mellitus without complications: Secondary | ICD-10-CM | POA: Diagnosis not present

## 2015-11-27 DIAGNOSIS — L97929 Non-pressure chronic ulcer of unspecified part of left lower leg with unspecified severity: Secondary | ICD-10-CM | POA: Diagnosis not present

## 2015-11-27 DIAGNOSIS — Z7984 Long term (current) use of oral hypoglycemic drugs: Secondary | ICD-10-CM | POA: Diagnosis not present

## 2015-11-27 DIAGNOSIS — Z4800 Encounter for change or removal of nonsurgical wound dressing: Secondary | ICD-10-CM | POA: Diagnosis not present

## 2015-11-27 DIAGNOSIS — I5030 Unspecified diastolic (congestive) heart failure: Secondary | ICD-10-CM | POA: Diagnosis not present

## 2015-11-27 DIAGNOSIS — J449 Chronic obstructive pulmonary disease, unspecified: Secondary | ICD-10-CM | POA: Diagnosis not present

## 2015-11-28 DIAGNOSIS — I5030 Unspecified diastolic (congestive) heart failure: Secondary | ICD-10-CM | POA: Diagnosis not present

## 2015-11-28 DIAGNOSIS — J449 Chronic obstructive pulmonary disease, unspecified: Secondary | ICD-10-CM | POA: Diagnosis not present

## 2015-11-28 DIAGNOSIS — Z794 Long term (current) use of insulin: Secondary | ICD-10-CM | POA: Diagnosis not present

## 2015-11-28 DIAGNOSIS — Z4800 Encounter for change or removal of nonsurgical wound dressing: Secondary | ICD-10-CM | POA: Diagnosis not present

## 2015-11-28 DIAGNOSIS — Z7984 Long term (current) use of oral hypoglycemic drugs: Secondary | ICD-10-CM | POA: Diagnosis not present

## 2015-11-28 DIAGNOSIS — L97929 Non-pressure chronic ulcer of unspecified part of left lower leg with unspecified severity: Secondary | ICD-10-CM | POA: Diagnosis not present

## 2015-11-28 DIAGNOSIS — K7581 Nonalcoholic steatohepatitis (NASH): Secondary | ICD-10-CM | POA: Diagnosis not present

## 2015-11-28 DIAGNOSIS — I872 Venous insufficiency (chronic) (peripheral): Secondary | ICD-10-CM | POA: Diagnosis not present

## 2015-11-28 DIAGNOSIS — Z79891 Long term (current) use of opiate analgesic: Secondary | ICD-10-CM | POA: Diagnosis not present

## 2015-11-28 DIAGNOSIS — I110 Hypertensive heart disease with heart failure: Secondary | ICD-10-CM | POA: Diagnosis not present

## 2015-11-28 DIAGNOSIS — E119 Type 2 diabetes mellitus without complications: Secondary | ICD-10-CM | POA: Diagnosis not present

## 2015-11-29 ENCOUNTER — Encounter: Payer: Self-pay | Admitting: Cardiology

## 2015-11-29 ENCOUNTER — Ambulatory Visit (INDEPENDENT_AMBULATORY_CARE_PROVIDER_SITE_OTHER): Payer: Medicare Other | Admitting: Cardiology

## 2015-11-29 VITALS — BP 124/70 | HR 93 | Ht 64.0 in | Wt 211.0 lb

## 2015-11-29 DIAGNOSIS — I5033 Acute on chronic diastolic (congestive) heart failure: Secondary | ICD-10-CM

## 2015-11-29 DIAGNOSIS — Z79899 Other long term (current) drug therapy: Secondary | ICD-10-CM | POA: Diagnosis not present

## 2015-11-29 DIAGNOSIS — R600 Localized edema: Secondary | ICD-10-CM

## 2015-11-29 DIAGNOSIS — I509 Heart failure, unspecified: Secondary | ICD-10-CM

## 2015-11-29 HISTORY — DX: Heart failure, unspecified: I50.9

## 2015-11-29 MED ORDER — BUMETANIDE 2 MG PO TABS: 3.0000 mg | ORAL_TABLET | Freq: Two times a day (BID) | ORAL | Status: DC

## 2015-11-29 NOTE — Progress Notes (Signed)
Patient ID: BRODIN GELPI, male   DOB: 1945-11-15, 70 y.o.   MRN: [id number]     Clinical Summary Mr. Cuthrell is a 70 y.o.male seen today for follow up of the following medical problems.    1. Chronic diastolic heart failure - echo 05/2015 with normal LVEF 60-65%, grade II diastolic dysfunction - Jan 2017 echo LVEF 40-98%, grade I diastolic dysfunction - recent admission with acute on chronic diastolic HF, diuresed and discharge weight 195 lbs.  - Weight up 195 to 211 since hospital discharge. - edema likely combined diastolic HF and lymphedema - good response to metolazone in the past, but developed significant hyponatremia.  2. COPD - followed by pcp  3. Hx of NASH  4. Hypothyroid -follow by pcp  Past Medical History  Diagnosis Date  . COPD (chronic obstructive pulmonary disease) ([place])   . Type 2 diabetes mellitus ([place])   . Hyperlipidemia   . Essential hypertension   . Iron deficiency anemia   . NASH (nonalcoholic steatohepatitis)   . Diabetic neuropathy ([place])   . GERD (gastroesophageal reflux disease)   . Arthritis   . Depression   . Diastolic dysfunction   . Left knee DJD   . Lumbar spinal stenosis   . DDD (degenerative disc disease), lumbosacral   . Peripheral edema      No Known Allergies   Current Outpatient Prescriptions  Medication Sig Dispense Refill  . ACCU-CHEK AVIVA PLUS test strip     . aspirin EC 81 MG tablet Take 81 mg by mouth daily.    . B-D ULTRAFINE III SHORT PEN 31G X 8 MM MISC USE AS DIRECTED DAILY WITH LANTUS. 100 each 0  . benazepril (LOTENSIN) 40 MG tablet TAKE ONE TABLET BY MOUTH DAILY. 90 tablet 1  . bumetanide (BUMEX) 2 MG tablet Take 1 tablet (2 mg total) by mouth 2 (two) times daily. 60 tablet 1  . BYSTOLIC 10 MG tablet TAKE ONE TABLET BY MOUTH DAILY. 30 tablet 6  . clobetasol cream (TEMOVATE) 0.05 % APPLY TO AFFECTED AREA 2 TIMES DAILY. 45 g 11  . diazepam (VALIUM) 5 MG tablet Take 1 tablet (5 mg total) by mouth at bedtime as needed  for anxiety. 10 tablet 0  . diclofenac sodium (VOLTAREN) 1 % GEL Apply 4 g topically 4 (four) times daily. 5 Tube 5  . Ferrous Sulfate (IRON) 325 (65 FE) MG TABS Take 1 tablet by mouth daily.     Marland Kitchen gabapentin (NEURONTIN) 400 MG capsule TAKE ONE CAPSULE BY MOUTH THREE TIMES DAILY. 90 capsule 3  . LANTUS SOLOSTAR 100 UNIT/ML Solostar Pen INJECT 30 UNITS SUBCUTANEOUSLY AT BEDTIME. 15 mL 0  . levothyroxine (SYNTHROID, LEVOTHROID) 75 MCG tablet Take 1 tablet (75 mcg total) by mouth daily before breakfast. 30 tablet 0  . Magnesium 400 MG TABS Take 400 mg by mouth 2 (two) times daily. 60 tablet 6  . meloxicam (MOBIC) 15 MG tablet Take 1 tablet (15 mg total) by mouth daily.    . metFORMIN (GLUCOPHAGE) 1000 MG tablet TAKE ONE TABLET BY MOUTH TWICE DAILY WITH A MEAL. 180 tablet 3  . nystatin cream (MYCOSTATIN) Apply 1 application topically 2 (two) times daily. 45 g 1  . omeprazole (PRILOSEC) 20 MG capsule TAKE ONE CAPSULE BY MOUTH TWICE DAILY. 60 capsule 11  . oxyCODONE-acetaminophen (PERCOCET) 7.5-325 MG tablet Take one tablet by mouth every 6 hours as needed for severe pain. Max APAP 3gm/24hrs from all sources 120 tablet 0  . potassium  chloride (K-DUR) 10 MEQ tablet Take 2 tablets in the AM / Take 1 tablet in the PM (Patient taking differently: 20 mEq daily. Take 2 tablets in the AM / Take 1 tablet in the PM) 90 tablet 3  . pravastatin (PRAVACHOL) 40 MG tablet TAKE ONE TABLET BY MOUTH DAILY. 90 tablet 0   No current facility-administered medications for this visit.     Past Surgical History  Procedure Laterality Date  . Hernia repair    . Shoulder surgery      Rght-rotator cuff  . Lipoma removal      Stomach  . Knee arthroscopy with medial menisectomy Left 11/06/2012    Procedure: KNEE ARTHROSCOPY WITH MEDIAL MENISECTOMY;  Surgeon: Carole Civil, MD;  Location: [facility];  Service: Orthopedics;  Laterality: Left;  . Lumbar laminectomy/decompression microdiscectomy Left 08/30/2013    Procedure:  LUMBAR LAMINECTOMY/DECOMPRESSION MICRODISCECTOMY LEFT  LUMBAR TWO THREE;  Surgeon: Otilio Connors, MD;  Location: [facility];  Service: Neurosurgery;  Laterality: Left;  . Egd with enteroscopy  2011    [facility]: normal esophagus and stomach. Normal duodenum, jejunum. No evidence of AVMs.   . Colonoscopy  2011    [facility]: normal colon, normal distal ileum  . Colonoscopy with propofol N/A 07/21/2014    Procedure: ATTEMPTED COLONOSCOPY WITH PROPOFOL-HAD TO STOP DUE TO BRADYCARDIA;  Surgeon: Daneil Dolin, MD;  Location: [facility];  Service: Endoscopy;  Laterality: N/A;  . Esophagogastroduodenoscopy (egd) with propofol N/A 07/21/2014    Procedure: ESOPHAGOGASTRODUODENOSCOPY (EGD) WITH PROPOFOL;  Surgeon: Daneil Dolin, MD;  Location: [facility];  Service: Endoscopy;  Laterality: N/A;  . Biopsy N/A 07/21/2014    Procedure: BIOPSY;  Surgeon: Daneil Dolin, MD;  Location: [facility];  Service: Endoscopy;  Laterality: N/A;     No Known Allergies    Family History  Problem Relation Age of Onset  . Heart disease Mother   . Hyperlipidemia Mother   . Hypertension Mother   . Depression Mother   . Diabetes Mother   . Rectal cancer Mother   . Cancer Mother   . Heart disease Father   . Hypertension Father   . Hyperlipidemia Father   . Diabetes Father   . Cancer Father   . Heart disease Sister   . Hyperlipidemia Sister   . Hypertension Sister   . Diabetes Sister   . Diabetes Brother   . Heart disease Sister   . Hyperlipidemia Sister   . Hypertension Sister   . Heart disease Sister   . Hyperlipidemia Sister   . Hypertension Sister   . Diabetes Sister   . Diabetes Brother      Social History Mr. Hisaw reports that he quit smoking about 16 years ago. His smoking use included Cigarettes. He has a 110 pack-year smoking history. He quit smokeless tobacco use about 16 years ago. Mr. Ostrand reports that he does not drink alcohol.   Review of Systems CONSTITUTIONAL: No weight loss, fever,  chills, weakness or fatigue.  HEENT: Eyes: No visual loss, blurred vision, double vision or yellow sclerae.No hearing loss, sneezing, congestion, runny nose or sore throat.  SKIN: No rash or itching.  CARDIOVASCULAR: no chest pain no palpitations.  RESPIRATORY: No shortness of breath, cough or sputum.  GASTROINTESTINAL: No anorexia, nausea, vomiting or diarrhea. No abdominal pain or blood.  GENITOURINARY: No burning on urination, no polyuria NEUROLOGICAL: No headache, dizziness, syncope, paralysis, ataxia, numbness or tingling in the extremities. No change in bowel  or bladder control.  MUSCULOSKELETAL: No muscle, back pain, joint pain or stiffness.  LYMPHATICS: No enlarged nodes. No history of splenectomy.  PSYCHIATRIC: No history of depression or anxiety.  ENDOCRINOLOGIC: No reports of sweating, cold or heat intolerance. No polyuria or polydipsia.  Marland Kitchen   Physical Examination Filed Vitals:   11/29/15 0849  BP: 124/70  Pulse: 93   Filed Vitals:   11/29/15 0849  Height: 5' 4"  (1.626 m)  Weight: 211 lb (95.709 kg)    Gen: resting comfortably, no acute distress HEENT: no scleral icterus, pupils equal round and reactive, no palptable cervical adenopathy,  CV: RRR, no m/r/,g no jvd Resp: Clear to auscultation bilaterally GI: abdomen is soft, non-tender, non-distended, normal bowel sounds, no hepatosplenomegaly MSK: extremities are warm, 2+ bilateral LE edema  Skin: warm, no rash Neuro:  no focal deficits Psych: appropriate affect   Diagnostic Studies  05/2015 echo Study Conclusions  - Left ventricle: The cavity size was normal. Wall thickness was increased in a pattern of mild LVH. Systolic function was normal. The estimated ejection fraction was in the range of 60% to 65%. Wall motion was normal; there were no regional wall motion abnormalities. Features are consistent with a pseudonormal left ventricular filling pattern, with concomitant abnormal relaxation and  increased filling pressure (grade 2 diastolic dysfunction). Doppler parameters are consistent with high ventricular filling pressure. - Aortic valve: Mildly calcified annulus. - Mitral valve: Mildly calcified annulus. There was trivial regurgitation. - Left atrium: The atrium was mildly to moderately dilated. Volume/bsa, S: 34.4 ml/m^2. Volume/bsa, ES (1-plane Simpson&'s, A4C): 31.3 ml/m^2.  Jan 2017 Study Conclusions  - Left ventricle: The cavity size was mildly dilated. Wall thickness was increased in a pattern of mild LVH. Systolic function was normal. The estimated ejection fraction was in the range of 50% to 55%. Wall motion was normal; there were no regional wall motion abnormalities. Doppler parameters are consistent with abnormal left ventricular relaxation (grade 1 diastolic dysfunction). - Mitral valve: Calcified annulus. Mildly thickened leaflets . - Atrial septum: No defect or patent foramen ovale was identified.  Assessment and Plan   1. LE edema - likely combination of lymphedema and acute on chronic diastolic HF - did not responsd to lasix or torsemide, currently on bumex. Significant hyponatremia when he took  metolazone more frequently in the past, will try to hold off on restarting at this time.  - significant weight gain and increased edema since hospital discharge. - will increase bumex to 31m bid. Check BMET and Mg in 2 weeks, [facility].   F/u 2 weeks.         JArnoldo Lenis M.D.

## 2015-11-29 NOTE — Patient Instructions (Signed)
Medication Instructions:  Increase Bumex to 3 mg (1 & 1/2 tablets) Two times daily  Labwork: Your physician recommends that you return for lab work in: 2 weeks  BMET MAGNESIUM   Testing/Procedures: NONE  Follow-Up: Your physician recommends that you schedule a follow-up appointment in: 2 WEEKS    Any Other Special Instructions Will Be Listed Below (If Applicable). I have put in a referral for [facility] and they should be getting in touch with you soon.     If you need a refill on your cardiac medications before your next appointment, please call your pharmacy.

## 2015-11-30 DIAGNOSIS — E119 Type 2 diabetes mellitus without complications: Secondary | ICD-10-CM | POA: Diagnosis not present

## 2015-11-30 DIAGNOSIS — Z7984 Long term (current) use of oral hypoglycemic drugs: Secondary | ICD-10-CM | POA: Diagnosis not present

## 2015-11-30 DIAGNOSIS — J449 Chronic obstructive pulmonary disease, unspecified: Secondary | ICD-10-CM | POA: Diagnosis not present

## 2015-11-30 DIAGNOSIS — Z79891 Long term (current) use of opiate analgesic: Secondary | ICD-10-CM | POA: Diagnosis not present

## 2015-11-30 DIAGNOSIS — K7581 Nonalcoholic steatohepatitis (NASH): Secondary | ICD-10-CM | POA: Diagnosis not present

## 2015-11-30 DIAGNOSIS — I5030 Unspecified diastolic (congestive) heart failure: Secondary | ICD-10-CM | POA: Diagnosis not present

## 2015-11-30 DIAGNOSIS — L97929 Non-pressure chronic ulcer of unspecified part of left lower leg with unspecified severity: Secondary | ICD-10-CM | POA: Diagnosis not present

## 2015-11-30 DIAGNOSIS — I110 Hypertensive heart disease with heart failure: Secondary | ICD-10-CM | POA: Diagnosis not present

## 2015-11-30 DIAGNOSIS — I872 Venous insufficiency (chronic) (peripheral): Secondary | ICD-10-CM | POA: Diagnosis not present

## 2015-11-30 DIAGNOSIS — Z794 Long term (current) use of insulin: Secondary | ICD-10-CM | POA: Diagnosis not present

## 2015-11-30 DIAGNOSIS — Z4800 Encounter for change or removal of nonsurgical wound dressing: Secondary | ICD-10-CM | POA: Diagnosis not present

## 2015-12-03 ENCOUNTER — Other Ambulatory Visit: Payer: Self-pay | Admitting: Family Medicine

## 2015-12-04 ENCOUNTER — Other Ambulatory Visit: Payer: Self-pay | Admitting: Family Medicine

## 2015-12-04 ENCOUNTER — Other Ambulatory Visit: Payer: Self-pay | Admitting: *Deleted

## 2015-12-04 DIAGNOSIS — I890 Lymphedema, not elsewhere classified: Secondary | ICD-10-CM

## 2015-12-04 DIAGNOSIS — I5032 Chronic diastolic (congestive) heart failure: Secondary | ICD-10-CM

## 2015-12-04 MED ORDER — OXYCODONE-ACETAMINOPHEN 7.5-325 MG PO TABS: ORAL_TABLET | ORAL | Status: DC

## 2015-12-04 NOTE — Telephone Encounter (Signed)
Prescription printed and patient made aware to come to office to pick up.

## 2015-12-04 NOTE — Patient Outreach (Signed)
[facility]) Care Management  12/04/2015  Michael [name] 03-04-1946 [id number]   Referral from MD office for Heart Failure-Cardiologist:  Telephone call to patient who was advised of reason for call & of [place] care management services.   Patient states major health concern is his inability to walk due swelling in legs (lymphedema) & Chronic heart failure . States he currently has swelling in both lower legs that starts at knees and goes to feet. States there is no weeping of swollen areas. States he weighs daily with weight of 212 today-211 yesterday. States when discharged from [facility] 1/28 weight was 198. States he elevates legs daily but still has swelling. States he recently saw primary care physician 02/07 and cardiologist 03/01. Currently receiving home health physical therapy twice weekly and will start aid services tomorrow from [place] benefits. States he lives alone and uses wheelchair to get around for last 6 months. States he needs assistance with personal care. Currently church member takes him to all MD appointments. States bathroom is handicapped equipped. Has ramp outside of home.   States getting medications at local pharmacy without problems. Voices that his niece ([name]) sets up his medication weekly and he takes medications as directed by MD consistently. States he administers own insulin and checks his blood sugar daily. States last  "A1c"-6.3. States blood sugars usually 90-120.   Patient states he needs assistance with trying to learn more about his heart failure how to  keep swelling down. States does not want to get the drainage from his legs that caused wounds previously. Patient states he has not fallen but gets a little dizzy when he gets up from bed.   Patient consents to [facility] care management services.  Plan: Will send to care management assistant to send to community care coordinator for complex case management , Dxs-acute on chronic diastolic heart  failure, , chronic lymphedema, DM 2-insulin dependent, degenerative disc disease, degenerative arthritis knees & hips ,neuropathy. Telephonic signing off.  Sherrin Daisy, RN BSN [place] Management Coordinator [facility] Care Management  [phone]

## 2015-12-04 NOTE — Telephone Encounter (Signed)
Ok to refill??  Last office visit/ refill 11/07/2015.

## 2015-12-04 NOTE — Telephone Encounter (Signed)
Okay to refill? 

## 2015-12-04 NOTE — Telephone Encounter (Signed)
Refill appropriate and filled per protocol. 

## 2015-12-04 NOTE — Telephone Encounter (Signed)
Patient calling requesting his oxyCODONE-acetaminophen (PERCOCET) 7.5-325 MG   CB# [phone]

## 2015-12-05 ENCOUNTER — Other Ambulatory Visit: Payer: Self-pay | Admitting: *Deleted

## 2015-12-05 DIAGNOSIS — K7581 Nonalcoholic steatohepatitis (NASH): Secondary | ICD-10-CM | POA: Diagnosis not present

## 2015-12-05 DIAGNOSIS — Z79891 Long term (current) use of opiate analgesic: Secondary | ICD-10-CM | POA: Diagnosis not present

## 2015-12-05 DIAGNOSIS — E119 Type 2 diabetes mellitus without complications: Secondary | ICD-10-CM | POA: Diagnosis not present

## 2015-12-05 DIAGNOSIS — I5030 Unspecified diastolic (congestive) heart failure: Secondary | ICD-10-CM | POA: Diagnosis not present

## 2015-12-05 DIAGNOSIS — J449 Chronic obstructive pulmonary disease, unspecified: Secondary | ICD-10-CM | POA: Diagnosis not present

## 2015-12-05 DIAGNOSIS — Z4800 Encounter for change or removal of nonsurgical wound dressing: Secondary | ICD-10-CM | POA: Diagnosis not present

## 2015-12-05 DIAGNOSIS — Z7984 Long term (current) use of oral hypoglycemic drugs: Secondary | ICD-10-CM | POA: Diagnosis not present

## 2015-12-05 DIAGNOSIS — I110 Hypertensive heart disease with heart failure: Secondary | ICD-10-CM | POA: Diagnosis not present

## 2015-12-05 DIAGNOSIS — I872 Venous insufficiency (chronic) (peripheral): Secondary | ICD-10-CM | POA: Diagnosis not present

## 2015-12-05 DIAGNOSIS — L97929 Non-pressure chronic ulcer of unspecified part of left lower leg with unspecified severity: Secondary | ICD-10-CM | POA: Diagnosis not present

## 2015-12-05 DIAGNOSIS — Z794 Long term (current) use of insulin: Secondary | ICD-10-CM | POA: Diagnosis not present

## 2015-12-05 NOTE — Patient Outreach (Signed)
[facility]) Care Management  12/05/2015  Michael [name] [birth year] [id number]   Referral received from telephonic case manager in response to physician referral for assistance with management of CHF and lymphedema.   Michael [name] is a 70 year old gentleman living in [place], [place] who was recently discharged from the hospital after an admission for CHF. His cardiologist, Dr. [name], referred him to [place] management after his post hospital office follow up. I was unable to reach Michael [name] when I called today but left a HIPPA compliant voice message requesting a return call.   I understand from my colleague's assessment that Michael [name] weight is up to 212# from 198# on discharge from [facility] on 10/28/15. Michael [name] reported swelling of his legs despite adherence to medication regimen and elevation of his legs. He saw him primary care provider, Dr. Vic Blackbird on 2/7 and his cardiologist, Dr. [name] on 3/1. Michael [name] is receiving HHPT twice weekly and will have a home care aide via his [employer] benefit.Michael [name] lives alone, uses wheelchair to get around, has a handicapped equipped bathroom, and a ramp outside the home.    Plan: I will reach out to Michael [name] again tomorrow.    [place] Management  [phone]

## 2015-12-06 DIAGNOSIS — I110 Hypertensive heart disease with heart failure: Secondary | ICD-10-CM | POA: Diagnosis not present

## 2015-12-06 DIAGNOSIS — J449 Chronic obstructive pulmonary disease, unspecified: Secondary | ICD-10-CM | POA: Diagnosis not present

## 2015-12-06 DIAGNOSIS — E119 Type 2 diabetes mellitus without complications: Secondary | ICD-10-CM | POA: Diagnosis not present

## 2015-12-06 DIAGNOSIS — I5030 Unspecified diastolic (congestive) heart failure: Secondary | ICD-10-CM | POA: Diagnosis not present

## 2015-12-06 DIAGNOSIS — I872 Venous insufficiency (chronic) (peripheral): Secondary | ICD-10-CM | POA: Diagnosis not present

## 2015-12-06 DIAGNOSIS — K7581 Nonalcoholic steatohepatitis (NASH): Secondary | ICD-10-CM | POA: Diagnosis not present

## 2015-12-06 DIAGNOSIS — Z79891 Long term (current) use of opiate analgesic: Secondary | ICD-10-CM | POA: Diagnosis not present

## 2015-12-06 DIAGNOSIS — Z794 Long term (current) use of insulin: Secondary | ICD-10-CM | POA: Diagnosis not present

## 2015-12-06 DIAGNOSIS — Z7984 Long term (current) use of oral hypoglycemic drugs: Secondary | ICD-10-CM | POA: Diagnosis not present

## 2015-12-06 DIAGNOSIS — Z4800 Encounter for change or removal of nonsurgical wound dressing: Secondary | ICD-10-CM | POA: Diagnosis not present

## 2015-12-06 DIAGNOSIS — L97929 Non-pressure chronic ulcer of unspecified part of left lower leg with unspecified severity: Secondary | ICD-10-CM | POA: Diagnosis not present

## 2015-12-07 ENCOUNTER — Other Ambulatory Visit: Payer: Self-pay | Admitting: *Deleted

## 2015-12-07 ENCOUNTER — Encounter: Payer: Self-pay | Admitting: *Deleted

## 2015-12-07 DIAGNOSIS — I110 Hypertensive heart disease with heart failure: Secondary | ICD-10-CM | POA: Diagnosis not present

## 2015-12-07 DIAGNOSIS — Z79891 Long term (current) use of opiate analgesic: Secondary | ICD-10-CM | POA: Diagnosis not present

## 2015-12-07 DIAGNOSIS — Z4800 Encounter for change or removal of nonsurgical wound dressing: Secondary | ICD-10-CM | POA: Diagnosis not present

## 2015-12-07 DIAGNOSIS — E119 Type 2 diabetes mellitus without complications: Secondary | ICD-10-CM | POA: Diagnosis not present

## 2015-12-07 DIAGNOSIS — I5030 Unspecified diastolic (congestive) heart failure: Secondary | ICD-10-CM | POA: Diagnosis not present

## 2015-12-07 DIAGNOSIS — J449 Chronic obstructive pulmonary disease, unspecified: Secondary | ICD-10-CM | POA: Diagnosis not present

## 2015-12-07 DIAGNOSIS — K7581 Nonalcoholic steatohepatitis (NASH): Secondary | ICD-10-CM | POA: Diagnosis not present

## 2015-12-07 DIAGNOSIS — Z7984 Long term (current) use of oral hypoglycemic drugs: Secondary | ICD-10-CM | POA: Diagnosis not present

## 2015-12-07 DIAGNOSIS — Z794 Long term (current) use of insulin: Secondary | ICD-10-CM | POA: Diagnosis not present

## 2015-12-07 DIAGNOSIS — I872 Venous insufficiency (chronic) (peripheral): Secondary | ICD-10-CM | POA: Diagnosis not present

## 2015-12-07 DIAGNOSIS — L97929 Non-pressure chronic ulcer of unspecified part of left lower leg with unspecified severity: Secondary | ICD-10-CM | POA: Diagnosis not present

## 2015-12-07 NOTE — Patient Outreach (Signed)
[facility] Care Management   12/07/2015  Michael [name] 07/01/1946 [id number]  Michael [name] is an 70 y.o. male living alone in senior apartments in [place]. His niece is very attentive. She and her husband visit at least weekly and she fills Michael [name] pill boxes. He has a friend from church who helps with transportation to doctor appointments when needed. Michael [name] has a wheelchair at home and says he doesn't try to stand or walk without his physical therapist and a walker. He has a hospital bed and accessible bathroom. Michael [name] reports that he has been working with [place] and [place] from [place] and has a CNA coming 15 hours/week via the [place]. Michael [name] gets prescriptions filled at [facility] and they are delivered to him. He says his copays do not exceed $7/medication.   Subjective: "My legs are swelling up like crazy and my weight is going up every day!"  Objective:  BP 104/64 mmHg  Pulse 78  Ht 1.499 m (4' 11" )  Wt 213 lb (96.616 kg)  BMI 43.00 kg/m2  SpO2 96%  Review of Systems  Constitutional: Negative.   HENT: Negative.   Eyes: Positive for blurred vision.       Reports intermittent blurry vision  Respiratory: Negative for cough, sputum production and shortness of breath.   Cardiovascular: Positive for leg swelling.       2-3+ bilateral lower extremity pitting edema  Gastrointestinal: Negative.   Genitourinary: Negative.   Musculoskeletal: Positive for myalgias and joint pain. Negative for falls.  Skin: Positive for rash.  Neurological: Negative.   Psychiatric/Behavioral: Negative.     Physical Exam  Constitutional: He is oriented to person, place, and time. Vital signs are normal. He appears well-developed and well-nourished. He is active.  Neck: No JVD present.  Cardiovascular: Normal rate and regular rhythm.   Respiratory: Effort normal. He has no wheezes. He has rales.  Fine Rales RLF  GI: Soft. Bowel sounds are  normal.  Neurological: He is alert and oriented to person, place, and time.  Skin: Skin is dry. Rash noted. There is erythema.     Erythematous, warm rash bilateral lower extremities  Psychiatric: He has a normal mood and affect. His speech is normal and behavior is normal. Judgment and thought content normal. Cognition and memory are normal.    Current Medications:   Current Outpatient Prescriptions  Medication Sig Dispense Refill  . ACCU-CHEK AVIVA PLUS test strip     . aspirin EC 81 MG tablet Take 81 mg by mouth daily.    . B-D ULTRAFINE III SHORT PEN 31G X 8 MM MISC USE AS DIRECTED DAILY WITH LANTUS. 100 each 0  . benazepril (LOTENSIN) 40 MG tablet TAKE ONE TABLET BY MOUTH DAILY. 90 tablet 0  . bumetanide (BUMEX) 2 MG tablet Take 1.5 tablets (3 mg total) by mouth 2 (two) times daily. 135 tablet 3  . BYSTOLIC 10 MG tablet TAKE ONE TABLET BY MOUTH DAILY. 30 tablet 6  . clobetasol cream (TEMOVATE) 0.05 % APPLY TO AFFECTED AREA 2 TIMES DAILY. 45 g 11  . diazepam (VALIUM) 5 MG tablet Take 1 tablet (5 mg total) by mouth at bedtime as needed for anxiety. 10 tablet 0  . diclofenac sodium (VOLTAREN) 1 % GEL Apply 4 g topically 4 (four) times daily. 5 Tube 5  . Ferrous Sulfate (IRON) 325 (65 FE) MG TABS Take 1 tablet by mouth daily.     Marland Kitchen gabapentin (NEURONTIN)  400 MG capsule TAKE ONE CAPSULE BY MOUTH THREE TIMES DAILY. 90 capsule 3  . LANTUS SOLOSTAR 100 UNIT/ML Solostar Pen INJECT 30 UNITS SUBCUTANEOUSLY AT BEDTIME. 15 mL 0  . levothyroxine (SYNTHROID, LEVOTHROID) 75 MCG tablet Take 1 tablet (75 mcg total) by mouth daily before breakfast. 30 tablet 0  . Magnesium 400 MG TABS Take 400 mg by mouth 2 (two) times daily. 60 tablet 6  . meloxicam (MOBIC) 15 MG tablet Take 1 tablet (15 mg total) by mouth daily.    . metFORMIN (GLUCOPHAGE) 1000 MG tablet TAKE ONE TABLET BY MOUTH TWICE DAILY WITH A MEAL. 180 tablet 3  . omeprazole (PRILOSEC) 20 MG capsule TAKE ONE CAPSULE BY MOUTH TWICE DAILY. 60  capsule 11  . oxyCODONE-acetaminophen (PERCOCET) 7.5-325 MG tablet Take one tablet by mouth every 6 hours as needed for severe pain. Max APAP 3gm/24hrs from all sources 120 tablet 0  . potassium chloride (K-DUR) 10 MEQ tablet Take 2 tablets in the AM / Take 1 tablet in the PM (Patient taking differently: 20 mEq daily. Take 2 tablets in the AM / Take 1 tablet in the PM) 90 tablet 3  . pravastatin (PRAVACHOL) 40 MG tablet TAKE ONE TABLET BY MOUTH DAILY. 90 tablet 0           Functional Status:   In your present state of health, do you have any difficulty performing the following activities: 12/07/2015 12/04/2015  Hearing? N N  Vision? N N  Difficulty concentrating or making decisions? N N  Walking or climbing stairs? Y Y  Dressing or bathing? Y Y  Doing errands, shopping? [name]  Preparing Food and eating ? N -  Using the Toilet? N -  In the past six months, have you accidently leaked urine? N -  Do you have problems with loss of bowel control? N -  Managing your Medications? N -  Managing your Finances? N -  Housekeeping or managing your Housekeeping? Y -    Fall/Depression Screening:    PHQ 2/9 Scores 12/04/2015 03/01/2015 01/18/2015 02/08/2014 01/05/2013  PHQ - 2 Score 2 0 2 2 0  PHQ- 9 Score 4 - 8 8 -    Assessment:    Acute Health Condition (edema, redness of bilateral lower extremities) - Michael [name] has 2-3+ bilateral lower extremity pitting edema; the swelling is not hard but is easily depressed; no vesicles are noted; patient denies weeping; skin is warm to touch; weight is up #15 since discharge from SNF late January; Michael [name] has history of venous stasis dermatitis and lymphedema and I he is a new patient to me so I do not have baseline with which to compare his symptoms. Please see image below, taken today.     Chronic Health Condition (CHF) - Michael [name] only recently started weighing himself daily; his weight is up 15# (213#) since discharge from SNF (after CHF admission) on  10/28/15 (198#); he has significant edema as outlined above; fine rales are noted in the right lower lung field; no JVD is noted; he is not short of breath during conversation and denies worsening shortness of breath; he is taking Bumex as prescribed  Chronic Health Condition (DM) - Mr. Lader noted an increase in [facility] after his hospitalization; he saw Dr. Buelah Manis in February when A1C was noted to be 6.3, up from 5.9; he is taking Metformin and Lantus as prescribed; Mr. Minish told me he is checking his cbg's once daily but his glucose monitor only  indicates < 10 checks in the last 30 days; I encouraged Mr. Tellefsen to check his cbg's daily and told him I would recheck his averages upon my return in 3 weeks; Mr. uvaldo rybacki knowledge of carb modified diet but I noted lots of candy (chocolates), cake, chips, crackers, and breads sitting out in the kitchen and by his recliner chair; we reviewed carb modified diet and I left reading material (Emmi); I offered to refer Mr. Shevchenko to the dietician and he said he would consider it  Plan:   Mr. Luse will weigh himself daily, record, and call his cardiology provider for weight gain of #3 overnight or 5# in a week.   Mr. Ta will see Dr. Harl Bowie next week as scheduled and Dr. Buelah Manis as scheduled the following week.   Mr. Stupka will continue to take all medications daily as prescribed.   Mr. Akel will check cbg's daily.   Mr. Keelin review Emmi educational literature provided and we will review at next visit.   I called Dr. Nelly Laurence office to report symptoms as outlined above and will follow up re: any orders/changes.   THN CM Care Plan Problem One        Most Recent Value   Care Plan Problem One  Acute/Chronic Health Condition (Swelling/Rash lower extremities with 15# weight gain)   Role Documenting the Problem One  Care Management [place] for Problem One  Active   THN Long Term Goal (31-90 days)  Over the next 31  days, patient will verbalize understanding of plan of care for treatment of peripheral edema and weight gain   THN Long Term Goal Start Date  12/07/15   Interventions for Problem One Long Term Goal  Utilizing teachback method, reviewed rationale for established plan of care and self health management activities,  notified cardiology provider of new symptoms,  reviewed use of blue [facility] Care Management notebook as self health management tool   THN CM Short Term Goal #1 (0-30 days)  Over the next 21 days, patient will weigh himself daily, record, and call provdier for weight gain of 3# overnight/5# in a week or new or worsening CHF symptoms   THN CM Short Term Goal #1 Start Date  12/07/15   Interventions for Short Term Goal #1  Utilizing teachback method and stop light tool, reviewed signs and symtpoms of CHF and lymphedema with patient,  reviewed rationale for daily weights   THN CM Short Term Goal #2 (0-30 days)  Over the next 7 days,  patient will attend scheduled cardiology appointment   [facility] CM Short Term Goal #2 Start Date  12/07/15   Interventions for Short Term Goal #2  Utliizing teachback method, reviewed next appointment date and helped patient calendar in new [facility] Patient Self Health Management Notebook   THN CM Short Term Goal #3 (0-30 days)  Over the next 7 days, patient will take all medications as prescribed, including any changes ordered by provider   [facility] CM Short Term Goal #3 Start Date  12/07/15   Interventions for Short Tern Goal #3  Utilizing teachback method, reviewed all patient medications,  notified provider of symptoms    [facility] CM Care Plan Problem Two        Most Recent Value   Care Plan Problem Two  Chronic Health Condition/Self Health Management Deficits (DM)    Role Documenting the Problem Two  Care Management Coordinator   Care Plan for Problem Two  Active  Interventions for Problem Two Long Term Goal   Utilizing teachback method, reviewed rationale for formal established  plan of care for DM management   THN Long Term Goal (31-90) days  Over the next 31 days, patient will verbalize plan for self health management of DM   THN Long Term Goal Start Date  12/07/15   THN CM Short Term Goal #1 (0-30 days)  Over the next 21 days, patient will check cbg's daily   THN CM Short Term Goal #1 Start Date  12/07/15   Interventions for Short Term Goal #2   Utilizing teachback method, reviewed rationale and importance of daily cbg monitoring with patient   THN CM Short Term Goal #2 (0-30 days)  Over the next 21 days, patient will review carb modified diet literature and prepare list of questions for next visit   THN CM Short Term Goal #2 Start Date  12/07/15   Interventions for Short Term Goal #2  Utilizing teachback method and [employer], reviewed prescribed carb modified diet with patient      [place] Management  [phone]

## 2015-12-08 ENCOUNTER — Encounter: Payer: Self-pay | Admitting: *Deleted

## 2015-12-08 ENCOUNTER — Other Ambulatory Visit: Payer: Self-pay | Admitting: *Deleted

## 2015-12-08 DIAGNOSIS — E119 Type 2 diabetes mellitus without complications: Secondary | ICD-10-CM | POA: Diagnosis not present

## 2015-12-08 DIAGNOSIS — I110 Hypertensive heart disease with heart failure: Secondary | ICD-10-CM | POA: Diagnosis not present

## 2015-12-08 DIAGNOSIS — K7581 Nonalcoholic steatohepatitis (NASH): Secondary | ICD-10-CM | POA: Diagnosis not present

## 2015-12-08 DIAGNOSIS — Z4800 Encounter for change or removal of nonsurgical wound dressing: Secondary | ICD-10-CM | POA: Diagnosis not present

## 2015-12-08 DIAGNOSIS — Z794 Long term (current) use of insulin: Secondary | ICD-10-CM | POA: Diagnosis not present

## 2015-12-08 DIAGNOSIS — Z7984 Long term (current) use of oral hypoglycemic drugs: Secondary | ICD-10-CM | POA: Diagnosis not present

## 2015-12-08 DIAGNOSIS — L97929 Non-pressure chronic ulcer of unspecified part of left lower leg with unspecified severity: Secondary | ICD-10-CM | POA: Diagnosis not present

## 2015-12-08 DIAGNOSIS — J449 Chronic obstructive pulmonary disease, unspecified: Secondary | ICD-10-CM | POA: Diagnosis not present

## 2015-12-08 DIAGNOSIS — I5030 Unspecified diastolic (congestive) heart failure: Secondary | ICD-10-CM | POA: Diagnosis not present

## 2015-12-08 DIAGNOSIS — I872 Venous insufficiency (chronic) (peripheral): Secondary | ICD-10-CM | POA: Diagnosis not present

## 2015-12-08 DIAGNOSIS — Z79891 Long term (current) use of opiate analgesic: Secondary | ICD-10-CM | POA: Diagnosis not present

## 2015-12-08 NOTE — Patient Outreach (Signed)
[facility]) Care Management  12/08/2015  Michael [name] [birth year] [id number]  I spoke with Michael [name] by phone today to follow up on peripheral edema and weight gain. Dr. Harl Bowie ordered Metolazone 2.9m to be taken twice weekly. I discussed this addition with Michael [name] rationale, and how to take medications, discussing when to call provider for new or worsening symptoms or concerns. Michael [name] has the phone number for the 24/7 nurse line if he needs assistance over the weekend. Michael [name] see Dr. DBuelah Manisnext week on Tuesday and I will call him for follow up on Thursday.    ARedmondManagement  [phone]

## 2015-12-11 ENCOUNTER — Other Ambulatory Visit: Payer: Self-pay | Admitting: *Deleted

## 2015-12-11 ENCOUNTER — Telehealth: Payer: Self-pay | Admitting: Cardiology

## 2015-12-11 DIAGNOSIS — K7581 Nonalcoholic steatohepatitis (NASH): Secondary | ICD-10-CM | POA: Diagnosis not present

## 2015-12-11 DIAGNOSIS — I110 Hypertensive heart disease with heart failure: Secondary | ICD-10-CM | POA: Diagnosis not present

## 2015-12-11 DIAGNOSIS — J449 Chronic obstructive pulmonary disease, unspecified: Secondary | ICD-10-CM | POA: Diagnosis not present

## 2015-12-11 DIAGNOSIS — Z7984 Long term (current) use of oral hypoglycemic drugs: Secondary | ICD-10-CM | POA: Diagnosis not present

## 2015-12-11 DIAGNOSIS — Z4800 Encounter for change or removal of nonsurgical wound dressing: Secondary | ICD-10-CM | POA: Diagnosis not present

## 2015-12-11 DIAGNOSIS — E119 Type 2 diabetes mellitus without complications: Secondary | ICD-10-CM | POA: Diagnosis not present

## 2015-12-11 DIAGNOSIS — Z79891 Long term (current) use of opiate analgesic: Secondary | ICD-10-CM | POA: Diagnosis not present

## 2015-12-11 DIAGNOSIS — Z794 Long term (current) use of insulin: Secondary | ICD-10-CM | POA: Diagnosis not present

## 2015-12-11 DIAGNOSIS — I872 Venous insufficiency (chronic) (peripheral): Secondary | ICD-10-CM | POA: Diagnosis not present

## 2015-12-11 DIAGNOSIS — I5030 Unspecified diastolic (congestive) heart failure: Secondary | ICD-10-CM | POA: Diagnosis not present

## 2015-12-11 DIAGNOSIS — L97929 Non-pressure chronic ulcer of unspecified part of left lower leg with unspecified severity: Secondary | ICD-10-CM | POA: Diagnosis not present

## 2015-12-11 NOTE — Telephone Encounter (Signed)
Please call patient regarding medication that was too be called in. / tg

## 2015-12-11 NOTE — Telephone Encounter (Signed)
Patient's weight is up to 214 lbs / tg

## 2015-12-11 NOTE — Patient Outreach (Signed)
Call to Dr. Nelly Laurence office, spoke with Karna Christmas regarding patient, weight today is 214#, question on medication. She will add to message that was left by patient this am. Requested call back regarding medication and reminded patient uses [facility]. Plan to follow up with patient after hearing back from Cardiology. Royetta Crochet. Laymond Purser, RN, BSN, [place] [phone]

## 2015-12-11 NOTE — Patient Outreach (Signed)
Voicemail received from patient as this RNCM was covering for assigned RNCM  Call to patient in regard to patient concern.  Patient reports he was supposed to get medication from Dr. Harl Bowie to take a couple of times a week, that would help with his fluid weight gain. Patient reports his weight is up to 214# Plan to call Cardiology to follow up. Royetta Crochet. Laymond Purser, RN, BSN, [place] [phone]

## 2015-12-11 NOTE — Telephone Encounter (Signed)
Waiting on Dr. Harl Bowie to reply to staff message to advise.

## 2015-12-12 ENCOUNTER — Telehealth: Payer: Self-pay | Admitting: *Deleted

## 2015-12-12 ENCOUNTER — Other Ambulatory Visit (HOSPITAL_COMMUNITY)
Admission: RE | Admit: 2015-12-12 | Discharge: 2015-12-12 | Disposition: A | Discharge status: Home / Self Care | Payer: Medicare Other | Source: Ambulatory Visit | Attending: Cardiology | Admitting: Cardiology

## 2015-12-12 DIAGNOSIS — J449 Chronic obstructive pulmonary disease, unspecified: Secondary | ICD-10-CM | POA: Diagnosis not present

## 2015-12-12 DIAGNOSIS — I5030 Unspecified diastolic (congestive) heart failure: Secondary | ICD-10-CM | POA: Diagnosis not present

## 2015-12-12 DIAGNOSIS — Z7984 Long term (current) use of oral hypoglycemic drugs: Secondary | ICD-10-CM | POA: Diagnosis not present

## 2015-12-12 DIAGNOSIS — Z79899 Other long term (current) drug therapy: Secondary | ICD-10-CM | POA: Diagnosis not present

## 2015-12-12 DIAGNOSIS — E119 Type 2 diabetes mellitus without complications: Secondary | ICD-10-CM | POA: Diagnosis not present

## 2015-12-12 DIAGNOSIS — Z4800 Encounter for change or removal of nonsurgical wound dressing: Secondary | ICD-10-CM | POA: Diagnosis not present

## 2015-12-12 DIAGNOSIS — I110 Hypertensive heart disease with heart failure: Secondary | ICD-10-CM | POA: Diagnosis not present

## 2015-12-12 DIAGNOSIS — Z794 Long term (current) use of insulin: Secondary | ICD-10-CM | POA: Diagnosis not present

## 2015-12-12 DIAGNOSIS — K7581 Nonalcoholic steatohepatitis (NASH): Secondary | ICD-10-CM | POA: Diagnosis not present

## 2015-12-12 DIAGNOSIS — L97929 Non-pressure chronic ulcer of unspecified part of left lower leg with unspecified severity: Secondary | ICD-10-CM | POA: Diagnosis not present

## 2015-12-12 DIAGNOSIS — Z79891 Long term (current) use of opiate analgesic: Secondary | ICD-10-CM | POA: Diagnosis not present

## 2015-12-12 DIAGNOSIS — I872 Venous insufficiency (chronic) (peripheral): Secondary | ICD-10-CM | POA: Diagnosis not present

## 2015-12-12 LAB — BASIC METABOLIC PANEL
ANION GAP: 12 (ref 5–15)
BUN: 22 mg/dL — ABNORMAL HIGH (ref 6–20)
CO2: 28 mmol/L (ref 22–32)
Calcium: 9 mg/dL (ref 8.9–10.3)
Chloride: 92 mmol/L — ABNORMAL LOW (ref 101–111)
Creatinine, Ser: 0.96 mg/dL (ref 0.61–1.24)
GFR calc Af Amer: >60 mL/min (ref >60)
GLUCOSE: 154 mg/dL — AB (ref 65–99)
POTASSIUM: 4.2 mmol/L (ref 3.5–5.1)
Sodium: 132 mmol/L — ABNORMAL LOW (ref 135–145)

## 2015-12-12 LAB — MAGNESIUM: Magnesium: 1.5 mg/dL — ABNORMAL LOW (ref 1.7–2.4)

## 2015-12-12 MED ORDER — METOLAZONE 2.5 MG PO TABS: ORAL_TABLET | ORAL | Status: DC

## 2015-12-12 NOTE — Telephone Encounter (Signed)
-----   Message from [name], MD sent at 12/11/2015  2:14 PM EDT ----- I had sent Staci a message last wee and cc'd the [facility] nurse. He should start metolazone 2.57m once weekly  J BRanch MD ----- Message -----    From: LDrema Dallas CMA    Sent: 12/11/2015  11:56 AM      To: JArnoldo Lenis MD  [facility]azone ( two pills weekly) ?? I looked back but cannot find that in your notes. He is gaining weight, as he said 3-5 llbs steadily. PLease advise.

## 2015-12-12 NOTE — Telephone Encounter (Signed)
-----   Message from [name], MD sent at 12/08/2015 10:38 AM EST ----- Daanya Lanphier can you have patient start metolazone 2.46m once weekly. Thank you for your assessment Alisa, he will be following up soon with me, I think if you would be willing to evaluate him after our follow up that would be most helpful, we will be in touch   JZandra AbtsMD ----- Message -----    From: AClerance Lav RN    Sent: 12/07/2015   4:42 PM      To: JArnoldo Lenis MD  Dr. BHarl Bowie   I saw Mr. GRabinovichat home today as per your request. He has 2-3+ bilateral lower extremity pitting edema; the swelling is not hard but is easily depressed; no vesicles are noted; patient denies weeping; skin is warm to touch; weight is up #15 since discharge from SNF late January; Mr. GCadenheadhas history of venous stasis dermatitis and lymphedema and he is a new patient to me so I do not have baseline with which to compare his symptoms. An image of his legs is viewable in MEnvironmental health practitionerand my full assessment note. Mr. GGiangrandeonly recently started weighing himself daily; his weight is up 15# (213#) since discharge from SNF (after CHF admission) on 10/28/15 (198#); he has significant edema as outlined above; fine rales are noted in the right lower lung field; no JVD is noted; he is not short of breath during conversation and denies worsening shortness of breath; he is taking Bumex as prescribed.   I am happy to follow up with Mr. GGorniakas per your direction.   Thank you,  ADelrae Rend

## 2015-12-12 NOTE — Telephone Encounter (Signed)
Pt aware, says he is having lab work done this AM and will come tomorrow for scheduled appt. Metolazone 2.5 mg once weekly sent to pharmacy as requested.

## 2015-12-12 NOTE — Telephone Encounter (Signed)
Called pt to make sure he knew how to take the metolazone. I saw where it was sent in to the pharmacy.

## 2015-12-12 NOTE — Patient Outreach (Signed)
Call to patient, no answer. Left VM. Will report to [facility] who is assigned case for follow up. Royetta Crochet. Laymond Purser, RN, BSN, [place] [phone]

## 2015-12-13 ENCOUNTER — Ambulatory Visit: Payer: Medicare Other | Admitting: Cardiology

## 2015-12-13 DIAGNOSIS — L89312 Pressure ulcer of right buttock, stage 2: Secondary | ICD-10-CM | POA: Diagnosis not present

## 2015-12-13 NOTE — Progress Notes (Unsigned)
Patient ID: Michael [name], male   DOB: [birth year], 70 y.o.   MRN: [id number]     Clinical Summary Michael [name] is a 70 y.o.male seen today for follow up of the following medical problems.   1. Chronic diastolic heart failure - echo 05/2015 with normal LVEF 60-65%, grade II diastolic dysfunction - Jan 2017 echo LVEF 40-34%, grade I diastolic dysfunction - recent admission with acute on chronic diastolic HF, diuresed and discharge weight 195 lbs.  - Weight up 195 to 211 since hospital discharge. - edema likely combined diastolic HF and lymphedema - good response to metolazone in the past, but developed significant hyponatremia.  - last visit we increased bumex to 48m bid, however weight continued to trend up. Started on metolazone 2.576mdaily. Labs yesterday stabel Cr and K.     2. COPD - followed by pcp  3. Hx of NASH  4. Hypothyroid -follow by pcp  Past Medical History  Diagnosis Date  . COPD (chronic obstructive pulmonary disease) ([place]  . Type 2 diabetes mellitus ([place]  . Hyperlipidemia   . Essential hypertension   . Iron deficiency anemia   . NASH (nonalcoholic steatohepatitis)   . Diabetic neuropathy ([place]  . GERD (gastroesophageal reflux disease)   . Arthritis   . Depression   . Diastolic dysfunction   . Left knee DJD   . Lumbar spinal stenosis   . DDD (degenerative disc disease), lumbosacral   . Peripheral edema      No Known Allergies   Current Outpatient Prescriptions  Medication Sig Dispense Refill  . ACCU-CHEK AVIVA PLUS test strip     . aspirin EC 81 MG tablet Take 81 mg by mouth daily.    . B-D ULTRAFINE III SHORT PEN 31G X 8 MM MISC USE AS DIRECTED DAILY WITH LANTUS. 100 each 0  . benazepril (LOTENSIN) 40 MG tablet TAKE ONE TABLET BY MOUTH DAILY. 90 tablet 0  . bumetanide (BUMEX) 2 MG tablet Take 1.5 tablets (3 mg total) by mouth 2 (two) times daily. 135 tablet 3  . BYSTOLIC 10 MG tablet TAKE ONE TABLET BY MOUTH DAILY. 30 tablet 6  . clobetasol  cream (TEMOVATE) 0.05 % APPLY TO AFFECTED AREA 2 TIMES DAILY. 45 g 11  . diazepam (VALIUM) 5 MG tablet Take 1 tablet (5 mg total) by mouth at bedtime as needed for anxiety. 10 tablet 0  . diclofenac sodium (VOLTAREN) 1 % GEL Apply 4 g topically 4 (four) times daily. 5 Tube 5  . Ferrous Sulfate (IRON) 325 (65 FE) MG TABS Take 1 tablet by mouth daily.     . Marland Kitchenabapentin (NEURONTIN) 400 MG capsule TAKE ONE CAPSULE BY MOUTH THREE TIMES DAILY. 90 capsule 3  . LANTUS SOLOSTAR 100 UNIT/ML Solostar Pen INJECT 30 UNITS SUBCUTANEOUSLY AT BEDTIME. 15 mL 0  . levothyroxine (SYNTHROID, LEVOTHROID) 75 MCG tablet Take 1 tablet (75 mcg total) by mouth daily before breakfast. 30 tablet 0  . Magnesium 400 MG TABS Take 400 mg by mouth 2 (two) times daily. 60 tablet 6  . meloxicam (MOBIC) 15 MG tablet Take 1 tablet (15 mg total) by mouth daily.    . metFORMIN (GLUCOPHAGE) 1000 MG tablet TAKE ONE TABLET BY MOUTH TWICE DAILY WITH A MEAL. 180 tablet 3  . metolazone (ZAROXOLYN) 2.5 MG tablet Take 1 tab by mouth once weekly 5 tablet 3  . nystatin cream (MYCOSTATIN) Apply 1 application topically 2 (two) times daily. (Patient not taking: Reported on 12/07/2015)  45 g 1  . omeprazole (PRILOSEC) 20 MG capsule TAKE ONE CAPSULE BY MOUTH TWICE DAILY. 60 capsule 11  . oxyCODONE-acetaminophen (PERCOCET) 7.5-325 MG tablet Take one tablet by mouth every 6 hours as needed for severe pain. Max APAP 3gm/24hrs from all sources 120 tablet 0  . potassium chloride (K-DUR) 10 MEQ tablet Take 2 tablets in the AM / Take 1 tablet in the PM (Patient taking differently: 20 mEq daily. Take 2 tablets in the AM / Take 1 tablet in the PM) 90 tablet 3  . pravastatin (PRAVACHOL) 40 MG tablet TAKE ONE TABLET BY MOUTH DAILY. 90 tablet 0   No current facility-administered medications for this visit.     Past Surgical History  Procedure Laterality Date  . Hernia repair    . Shoulder surgery      Rght-rotator cuff  . Lipoma removal      Stomach  . Knee  arthroscopy with medial menisectomy Left 11/06/2012    Procedure: KNEE ARTHROSCOPY WITH MEDIAL MENISECTOMY;  Surgeon: Carole Civil, MD;  Location: [facility];  Service: Orthopedics;  Laterality: Left;  . Lumbar laminectomy/decompression microdiscectomy Left 08/30/2013    Procedure: LUMBAR LAMINECTOMY/DECOMPRESSION MICRODISCECTOMY LEFT  LUMBAR TWO THREE;  Surgeon: Otilio Connors, MD;  Location: [place] [facility];  Service: Neurosurgery;  Laterality: Left;  . Egd with enteroscopy  2011    [facility]: normal esophagus and stomach. Normal duodenum, jejunum. No evidence of AVMs.   . Colonoscopy  2011    [facility]: normal colon, normal distal ileum  . Colonoscopy with propofol N/A 07/21/2014    Procedure: ATTEMPTED COLONOSCOPY WITH PROPOFOL-HAD TO STOP DUE TO BRADYCARDIA;  Surgeon: Daneil Dolin, MD;  Location: [facility];  Service: Endoscopy;  Laterality: N/A;  . Esophagogastroduodenoscopy (egd) with propofol N/A 07/21/2014    Procedure: ESOPHAGOGASTRODUODENOSCOPY (EGD) WITH PROPOFOL;  Surgeon: Daneil Dolin, MD;  Location: [facility];  Service: Endoscopy;  Laterality: N/A;  . Biopsy N/A 07/21/2014    Procedure: BIOPSY;  Surgeon: Daneil Dolin, MD;  Location: [facility];  Service: Endoscopy;  Laterality: N/A;     No Known Allergies    Family History  Problem Relation Age of Onset  . Heart disease Mother   . Hyperlipidemia Mother   . Hypertension Mother   . Depression Mother   . Diabetes Mother   . Rectal cancer Mother   . Cancer Mother   . Heart disease Father   . Hypertension Father   . Hyperlipidemia Father   . Diabetes Father   . Cancer Father   . Heart disease Sister   . Hyperlipidemia Sister   . Hypertension Sister   . Diabetes Sister   . Diabetes Brother   . Heart disease Sister   . Hyperlipidemia Sister   . Hypertension Sister   . Heart disease Sister   . Hyperlipidemia Sister   . Hypertension Sister   . Diabetes Sister   . Diabetes Brother      Social History Michael [name]  reports that he quit smoking about 16 years ago. His smoking use included Cigarettes. He has a 110 pack-year smoking history. He quit smokeless tobacco use about 16 years ago. Mr. Kamath reports that he does not drink alcohol.   Review of Systems CONSTITUTIONAL: No weight loss, fever, chills, weakness or fatigue.  HEENT: Eyes: No visual loss, blurred vision, double vision or yellow sclerae.No hearing loss, sneezing, congestion, runny nose or sore throat.  SKIN: No rash or itching.  CARDIOVASCULAR:  RESPIRATORY: No shortness of breath, cough or sputum.  GASTROINTESTINAL: No anorexia, nausea, vomiting or diarrhea. No abdominal pain or blood.  GENITOURINARY: No burning on urination, no polyuria NEUROLOGICAL: No headache, dizziness, syncope, paralysis, ataxia, numbness or tingling in the extremities. No change in bowel or bladder control.  MUSCULOSKELETAL: No muscle, back pain, joint pain or stiffness.  LYMPHATICS: No enlarged nodes. No history of splenectomy.  PSYCHIATRIC: No history of depression or anxiety.  ENDOCRINOLOGIC: No reports of sweating, cold or heat intolerance. No polyuria or polydipsia.  Marland Kitchen   Physical Examination There were no vitals filed for this visit. There were no vitals filed for this visit.  Gen: resting comfortably, no acute distress HEENT: no scleral icterus, pupils equal round and reactive, no palptable cervical adenopathy,  CV Resp: Clear to auscultation bilaterally GI: abdomen is soft, non-tender, non-distended, normal bowel sounds, no hepatosplenomegaly MSK: extremities are warm, no edema.  Skin: warm, no rash Neuro:  no focal deficits Psych: appropriate affect   Diagnostic Studies 05/2015 echo Study Conclusions  - Left ventricle: The cavity size was normal. Wall thickness was increased in a pattern of mild LVH. Systolic function was normal. The estimated ejection fraction was in the range of 60% to 65%. Wall motion was normal; there were no  regional wall motion abnormalities. Features are consistent with a pseudonormal left ventricular filling pattern, with concomitant abnormal relaxation and increased filling pressure (grade 2 diastolic dysfunction). Doppler parameters are consistent with high ventricular filling pressure. - Aortic valve: Mildly calcified annulus. - Mitral valve: Mildly calcified annulus. There was trivial regurgitation. - Left atrium: The atrium was mildly to moderately dilated. Volume/bsa, S: 34.4 ml/m^2. Volume/bsa, ES (1-plane Simpson&'s, A4C): 31.3 ml/m^2.  Jan 2017 Study Conclusions  - Left ventricle: The cavity size was mildly dilated. Wall thickness was increased in a pattern of mild LVH. Systolic function was normal. The estimated ejection fraction was in the range of 50% to 55%. Wall motion was normal; there were no regional wall motion abnormalities. Doppler parameters are consistent with abnormal left ventricular relaxation (grade 1 diastolic dysfunction). - Mitral valve: Calcified annulus. Mildly thickened leaflets . - Atrial septum: No defect or patent foramen ovale was identified.    Assessment and Plan  1. LE edema - likely combination of lymphedema and acute on chronic diastolic HF - did not responsd to lasix or torsemide, currently on bumex. Significant hyponatremia when he took metolazone more frequently in the past, will try to hold off on restarting at this time.  - significant weight gain and increased edema since hospital discharge. - will increase bumex to 17m bid. Check BMET and Mg in 2 weeks, [facility].   F/u 2 weeks.       JArnoldo Lenis M.D., F.A.C.C.

## 2015-12-14 DIAGNOSIS — E119 Type 2 diabetes mellitus without complications: Secondary | ICD-10-CM | POA: Diagnosis not present

## 2015-12-14 DIAGNOSIS — Z4800 Encounter for change or removal of nonsurgical wound dressing: Secondary | ICD-10-CM | POA: Diagnosis not present

## 2015-12-14 DIAGNOSIS — Z79891 Long term (current) use of opiate analgesic: Secondary | ICD-10-CM | POA: Diagnosis not present

## 2015-12-14 DIAGNOSIS — I872 Venous insufficiency (chronic) (peripheral): Secondary | ICD-10-CM | POA: Diagnosis not present

## 2015-12-14 DIAGNOSIS — L97929 Non-pressure chronic ulcer of unspecified part of left lower leg with unspecified severity: Secondary | ICD-10-CM | POA: Diagnosis not present

## 2015-12-14 DIAGNOSIS — I5030 Unspecified diastolic (congestive) heart failure: Secondary | ICD-10-CM | POA: Diagnosis not present

## 2015-12-14 DIAGNOSIS — I110 Hypertensive heart disease with heart failure: Secondary | ICD-10-CM | POA: Diagnosis not present

## 2015-12-14 DIAGNOSIS — Z7984 Long term (current) use of oral hypoglycemic drugs: Secondary | ICD-10-CM | POA: Diagnosis not present

## 2015-12-14 DIAGNOSIS — K7581 Nonalcoholic steatohepatitis (NASH): Secondary | ICD-10-CM | POA: Diagnosis not present

## 2015-12-14 DIAGNOSIS — J449 Chronic obstructive pulmonary disease, unspecified: Secondary | ICD-10-CM | POA: Diagnosis not present

## 2015-12-14 DIAGNOSIS — Z794 Long term (current) use of insulin: Secondary | ICD-10-CM | POA: Diagnosis not present

## 2015-12-15 ENCOUNTER — Other Ambulatory Visit: Payer: Self-pay | Admitting: *Deleted

## 2015-12-15 NOTE — Patient Outreach (Signed)
[facility]) Care Management  [facility] Care Manager  12/15/2015   Michael [name] 01-14-1946 [id number]  Subjective: "I'm feeling kind of tired but that new medicine is making me pee a lot and my swelling looks better."  Objective: Mr. Krichbaum is a 70 year old male with chronic diastolic heart failure with EF 60-65%, history of bradycardia, type II Diabetes, Hypertension, Iron Deficiency Anemia, NASH + Hepatomegaly, and degeneratove disc disease s/p surgery of L2-L3.   Mr. Meckel was admitted to the hospital in February with Acute/Chronic Diastolic Heart Failure and worsening lymphedema. Thereafter, he spent 21 days at [facility]. Mr. Vensel was referred to [place] [facility] for assistance with management of CHF.     Current Medications:  Current Outpatient Prescriptions  Medication Sig Dispense Refill  . ACCU-CHEK AVIVA PLUS test strip     . aspirin EC 81 MG tablet Take 81 mg by mouth daily.    . B-D ULTRAFINE III SHORT PEN 31G X 8 MM MISC USE AS DIRECTED DAILY WITH LANTUS. 100 each 0  . benazepril (LOTENSIN) 40 MG tablet TAKE ONE TABLET BY MOUTH DAILY. 90 tablet 0  . bumetanide (BUMEX) 2 MG tablet Take 1.5 tablets (3 mg total) by mouth 2 (two) times daily. 135 tablet 3  . BYSTOLIC 10 MG tablet TAKE ONE TABLET BY MOUTH DAILY. 30 tablet 6  . clobetasol cream (TEMOVATE) 0.05 % APPLY TO AFFECTED AREA 2 TIMES DAILY. 45 g 11  . diazepam (VALIUM) 5 MG tablet Take 1 tablet (5 mg total) by mouth at bedtime as needed for anxiety. 10 tablet 0  . diclofenac sodium (VOLTAREN) 1 % GEL Apply 4 g topically 4 (four) times daily. 5 Tube 5  . Ferrous Sulfate (IRON) 325 (65 FE) MG TABS Take 1 tablet by mouth daily.     Marland Kitchen gabapentin (NEURONTIN) 400 MG capsule TAKE ONE CAPSULE BY MOUTH THREE TIMES DAILY. 90 capsule 3  . LANTUS SOLOSTAR 100 UNIT/ML Solostar Pen INJECT 30 UNITS SUBCUTANEOUSLY AT BEDTIME. 15 mL 0  . levothyroxine (SYNTHROID, LEVOTHROID) 75 MCG tablet Take 1 tablet (75 mcg total) by  mouth daily before breakfast. 30 tablet 0  . Magnesium 400 MG TABS Take 400 mg by mouth 2 (two) times daily. 60 tablet 6  . meloxicam (MOBIC) 15 MG tablet Take 1 tablet (15 mg total) by mouth daily.    . metFORMIN (GLUCOPHAGE) 1000 MG tablet TAKE ONE TABLET BY MOUTH TWICE DAILY WITH A MEAL. 180 tablet 3  . metolazone (ZAROXOLYN) 2.5 MG tablet Take 1 tab by mouth once weekly 5 tablet 3  . nystatin cream (MYCOSTATIN) Apply 1 application topically 2 (two) times daily. (Patient not taking: Reported on 12/07/2015) 45 g 1  . omeprazole (PRILOSEC) 20 MG capsule TAKE ONE CAPSULE BY MOUTH TWICE DAILY. 60 capsule 11  . oxyCODONE-acetaminophen (PERCOCET) 7.5-325 MG tablet Take one tablet by mouth every 6 hours as needed for severe pain. Max APAP 3gm/24hrs from all sources 120 tablet 0  . potassium chloride (K-DUR) 10 MEQ tablet Take 2 tablets in the AM / Take 1 tablet in the PM (Patient taking differently: 20 mEq daily. Take 2 tablets in the AM / Take 1 tablet in the PM) 90 tablet 3  . pravastatin (PRAVACHOL) 40 MG tablet TAKE ONE TABLET BY MOUTH DAILY. 90 tablet 0   Assessment: Mr. Klemann is a 70 year old gentleman who was referred to [place] [facility] for assistance with management of CHF and DM. I saw him  at home on 3/9 at which time he was found to have 2-3+ bilateral lower extremity edema, redness, and warmth. His weight was up 15# since discharge from SNF in late January (198# > 213#); he was taking Bumex as ordered but I was concerned about how strictly he was adhering to his regimen. I contacted the provider office and Metolazone as added to his regimen. Today, Mr Witter tells me he has been taking the Bumex and Metolazone as prescribed. However, his weight is unchanged - he reports 213# again today. He says his swelling may be "slightly" improved but he is concerned that his legs generally don't look much better. He denies fever or worsened shortness of breath. Mr. Fristoe has an appointment with Dr.  Buelah Manis on Tuesday and with Dr. Harl Bowie the following Wednesday.   Images Legs 12/07/15 home visit     Chronic Health Condition (DM) - Mr. Pryor noted an increase in [facility] after his hospitalization; he saw Dr. Buelah Manis in February when A1C was noted to be 6.3, up from 5.9; he is taking Metformin and Lantus as prescribed; Mr. Gruenhagen told me he is checking his cbg's once daily but his glucose monitor only indicated < 10 checks in the last 30 days; I encouraged Mr. Severns to check his cbg's daily and told him I would recheck his averages upon my return in 3 weeks; Mr. jeshua ransford knowledge of carb modified diet but I noted lots of candy (chocolates), cake, chips, crackers, and breads sitting out in the kitchen and by his recliner chair; we reviewed carb modified diet and I left reading material (Emmi); I offered to refer Mr. Keng to the dietician and he said he would consider it  Plan:   Mr. Clack will weigh himself daily, record, and call his cardiology provider for weight gain of #3 overnight or 5# in a week.   Mr. Westrup will see Dr. Buelah Manis next week as scheduled Dr. Harl Bowie the following week.   I will call Mr. Disano next week and the following week after his provider visits.   I will see Mr. Cudd at home on 01/02/16 @ 10am.  Mr. Passero will continue to take all medications daily as prescribed.   Mr. Ghuman will check cbg's daily.   Mr. Majerus review Emmi educational literature provided and we will review at next visit.   I called Dr. Nelly Laurence office to report symptoms as outlined above and will follow up re: any orders/changes.    [place] [facility]  [phone]

## 2015-12-19 ENCOUNTER — Ambulatory Visit (INDEPENDENT_AMBULATORY_CARE_PROVIDER_SITE_OTHER): Payer: Medicare Other | Admitting: Family Medicine

## 2015-12-19 ENCOUNTER — Encounter: Payer: Self-pay | Admitting: Family Medicine

## 2015-12-19 VITALS — BP 136/78 | HR 82 | Temp 98.1°F | Resp 14

## 2015-12-19 DIAGNOSIS — R262 Difficulty in walking, not elsewhere classified: Secondary | ICD-10-CM | POA: Diagnosis not present

## 2015-12-19 DIAGNOSIS — E1149 Type 2 diabetes mellitus with other diabetic neurological complication: Secondary | ICD-10-CM

## 2015-12-19 DIAGNOSIS — I872 Venous insufficiency (chronic) (peripheral): Secondary | ICD-10-CM | POA: Diagnosis not present

## 2015-12-19 DIAGNOSIS — E871 Hypo-osmolality and hyponatremia: Secondary | ICD-10-CM | POA: Diagnosis not present

## 2015-12-19 DIAGNOSIS — Z79891 Long term (current) use of opiate analgesic: Secondary | ICD-10-CM | POA: Diagnosis not present

## 2015-12-19 DIAGNOSIS — E119 Type 2 diabetes mellitus without complications: Secondary | ICD-10-CM | POA: Diagnosis not present

## 2015-12-19 DIAGNOSIS — E038 Other specified hypothyroidism: Secondary | ICD-10-CM

## 2015-12-19 DIAGNOSIS — I890 Lymphedema, not elsewhere classified: Secondary | ICD-10-CM

## 2015-12-19 DIAGNOSIS — Z794 Long term (current) use of insulin: Secondary | ICD-10-CM | POA: Diagnosis not present

## 2015-12-19 DIAGNOSIS — K7581 Nonalcoholic steatohepatitis (NASH): Secondary | ICD-10-CM | POA: Diagnosis not present

## 2015-12-19 DIAGNOSIS — J449 Chronic obstructive pulmonary disease, unspecified: Secondary | ICD-10-CM | POA: Diagnosis not present

## 2015-12-19 DIAGNOSIS — L97929 Non-pressure chronic ulcer of unspecified part of left lower leg with unspecified severity: Secondary | ICD-10-CM | POA: Diagnosis not present

## 2015-12-19 DIAGNOSIS — Z7984 Long term (current) use of oral hypoglycemic drugs: Secondary | ICD-10-CM | POA: Diagnosis not present

## 2015-12-19 DIAGNOSIS — Z4800 Encounter for change or removal of nonsurgical wound dressing: Secondary | ICD-10-CM | POA: Diagnosis not present

## 2015-12-19 DIAGNOSIS — I5030 Unspecified diastolic (congestive) heart failure: Secondary | ICD-10-CM | POA: Diagnosis not present

## 2015-12-19 DIAGNOSIS — I110 Hypertensive heart disease with heart failure: Secondary | ICD-10-CM | POA: Diagnosis not present

## 2015-12-19 LAB — BASIC METABOLIC PANEL
BUN: 34 mg/dL — AB (ref 7–25)
CHLORIDE: 92 mmol/L — AB (ref 98–110)
CO2: 26 mmol/L (ref 20–31)
CREATININE: 1.12 mg/dL (ref 0.70–1.25)
Calcium: 9.3 mg/dL (ref 8.6–10.3)
Glucose, Bld: 250 mg/dL — ABNORMAL HIGH (ref 70–99)
Potassium: 4.9 mmol/L (ref 3.5–5.3)
Sodium: 134 mmol/L — ABNORMAL LOW (ref 135–146)

## 2015-12-19 LAB — TSH: TSH: 9.35 m[IU]/L — AB (ref 0.40–4.50)

## 2015-12-19 LAB — T4, FREE: FREE T4: 1.4 ng/dL (ref 0.8–1.8)

## 2015-12-19 LAB — T3, FREE: T3 FREE: 3.1 pg/mL (ref 2.3–4.2)

## 2015-12-19 MED ORDER — OXYCODONE-ACETAMINOPHEN 7.5-325 MG PO TABS: ORAL_TABLET | ORAL | Status: DC

## 2015-12-19 MED ORDER — MUPIROCIN CALCIUM 2 % EX CREA: 1.0000 "application " | TOPICAL_CREAM | Freq: Two times a day (BID) | CUTANEOUS | Status: DC

## 2015-12-19 NOTE — Patient Instructions (Addendum)
F/U 3 months Voltaren gel for joints, I do not recommend any other muscle rubs 30 units of lantus  Use the cream to any red areas- look like infection

## 2015-12-19 NOTE — Progress Notes (Signed)
Patient ID: Michael [name], male   DOB: [birth year], 70 y.o.   MRN: [id number]    Subjective:    Patient ID: Michael [name], male    DOB: [birth year], 70 y.o.   MRN: [id number]  Patient presents for 6 week F/U Patient for interim follow-up. Her last visit he had been discharged from the nursing home. There are multiple concerns including his care at home. I have completed some medicine menstruation paperwork for him with regards to services.  Please have difficulty walking secondary to his chronic back and knee pain as well as the chronic lymphedema which she is being followed by cardiology for.He has noticed some little weeping areas that have now scabbed over and he has some mild redness around. He wanted a refill on the topical antibiotic cream  Also had mildly abnormal thyroid function studies I will recheck his thyroid labs today.  DM- as we lowered his Lantus 28 units his A1c was 6.4% he is no longer having any hypoglycemia symptoms, He now states that his blood sugars have been running high but he did not bring his meter with him today states a bit going up to about 200  His last set of labs also showed some hyponatremia and a recheck metabolic panel  Review Of Systems:  GEN- denies fatigue, fever, weight loss,weakness, recent illness HEENT- denies eye drainage, change in vision, nasal discharge, CVS- denies chest pain, palpitations RESP- denies SOB, cough, wheeze ABD- denies N/V, change in stools, abd pain GU- denies dysuria, hematuria, dribbling, incontinence MSK- + joint pain, muscle aches, injury Neuro- denies headache, dizziness, syncope, seizure activity       Objective:    BP 136/78 mmHg  Pulse 82  Temp(Src) 98.1 F (36.7 C) (Oral)  Resp 14 GEN- NAD, alert and oriented x3, sitting in wheelchair  HEENT- PERRL, EOMI, non injected sclera, pink conjunctiva, MMM, oropharynx clear Neck- Supple, no thyromegaly CVS- RRR, no murmur RESP-CTAB ABD-NABS,soft,NT,ND EXT-  chronic non pitting lypmhedema to shins bilat,, no open lesions- scabbed areas no drainage on bilat legs, dry skin  Pulses- Radial 2+       Assessment & Plan:      Problem List Items Addressed This Visit    Type II diabetes mellitus with neurological manifestations ([place]) - Primary    His A1c from a month ago looks very good however his blood sugars have been elevated I'll have him go back to the 30 units of Lantus we also reiterated the importance of dietary control with his blood sugars He now has someone in a home that is assisting with his meals I think he may be eating more carbs      Relevant Orders   Basic metabolic panel (Completed)   Lymphedema    He is being followed by cardiology he had a recent change in his diuretic regimen. I've given him some Bactroban to use on any areas that look like they're becoming infected.      Hypothyroidism    Recheck thyroid function      Relevant Orders   TSH (Completed)   T3, free (Completed)   T4, free (Completed)   Difficulty walking    Other Visit Diagnoses    Hyponatremia        Relevant Orders    Basic metabolic panel (Completed)       Note: This dictation was prepared with Dragon dictation along with smaller phrase technology. Any transcriptional errors that result from this process are unintentional.

## 2015-12-20 ENCOUNTER — Other Ambulatory Visit: Payer: Self-pay | Admitting: *Deleted

## 2015-12-20 MED ORDER — LEVOTHYROXINE SODIUM 88 MCG PO TABS: 88.0000 ug | ORAL_TABLET | Freq: Every day | ORAL | Status: DC

## 2015-12-20 NOTE — Assessment & Plan Note (Signed)
His A1c from a month ago looks very good however his blood sugars have been elevated I'll have him go back to the 30 units of Lantus we also reiterated the importance of dietary control with his blood sugars He now has someone in a home that is assisting with his meals I think he may be eating more carbs

## 2015-12-20 NOTE — Assessment & Plan Note (Signed)
Recheck thyroid function

## 2015-12-20 NOTE — Assessment & Plan Note (Signed)
He is being followed by cardiology he had a recent change in his diuretic regimen. I've given him some Bactroban to use on any areas that look like they're becoming infected.

## 2015-12-21 DIAGNOSIS — L97929 Non-pressure chronic ulcer of unspecified part of left lower leg with unspecified severity: Secondary | ICD-10-CM | POA: Diagnosis not present

## 2015-12-21 DIAGNOSIS — I5030 Unspecified diastolic (congestive) heart failure: Secondary | ICD-10-CM | POA: Diagnosis not present

## 2015-12-21 DIAGNOSIS — I110 Hypertensive heart disease with heart failure: Secondary | ICD-10-CM | POA: Diagnosis not present

## 2015-12-21 DIAGNOSIS — J449 Chronic obstructive pulmonary disease, unspecified: Secondary | ICD-10-CM | POA: Diagnosis not present

## 2015-12-21 DIAGNOSIS — Z794 Long term (current) use of insulin: Secondary | ICD-10-CM | POA: Diagnosis not present

## 2015-12-21 DIAGNOSIS — Z79891 Long term (current) use of opiate analgesic: Secondary | ICD-10-CM | POA: Diagnosis not present

## 2015-12-21 DIAGNOSIS — I872 Venous insufficiency (chronic) (peripheral): Secondary | ICD-10-CM | POA: Diagnosis not present

## 2015-12-21 DIAGNOSIS — E119 Type 2 diabetes mellitus without complications: Secondary | ICD-10-CM | POA: Diagnosis not present

## 2015-12-21 DIAGNOSIS — Z7984 Long term (current) use of oral hypoglycemic drugs: Secondary | ICD-10-CM | POA: Diagnosis not present

## 2015-12-21 DIAGNOSIS — K7581 Nonalcoholic steatohepatitis (NASH): Secondary | ICD-10-CM | POA: Diagnosis not present

## 2015-12-21 DIAGNOSIS — Z4800 Encounter for change or removal of nonsurgical wound dressing: Secondary | ICD-10-CM | POA: Diagnosis not present

## 2015-12-22 ENCOUNTER — Other Ambulatory Visit: Payer: Self-pay | Admitting: *Deleted

## 2015-12-22 NOTE — Patient Outreach (Signed)
[place] [facility]) Care Management  12/22/2015  Michael [name] 10/07/1945 [id number]   Unable to reach Michael [name] by phone and unable to leave message today. I was reaching out to Michael [name] to follow up on his heart failure and peripheral edema.   Plan: I will reach out to Michael [name] again on Monday.    [place] [facility]  [phone]

## 2015-12-24 ENCOUNTER — Other Ambulatory Visit: Payer: Self-pay | Admitting: Family Medicine

## 2015-12-25 ENCOUNTER — Telehealth: Payer: Self-pay | Admitting: *Deleted

## 2015-12-25 ENCOUNTER — Other Ambulatory Visit: Payer: Self-pay | Admitting: *Deleted

## 2015-12-25 NOTE — Telephone Encounter (Signed)
Michael [name], please have this patient take metolazone 2.5 mg daily for the next 3 days then resume his previous once weekly dosing             [name] MD    I have tried to phone pt, no VM.

## 2015-12-25 NOTE — Patient Outreach (Signed)
[facility] [facility]) Care Management  12/25/2015  Michael [name] [birth year] [id number]  I reached out to [name] today to follow up on changes made to his diuretic regimen and to follow up on his skin condition (peripheral edema vs lymphedema). [name] states that his weight has remained within 1 pound of 212# even with changes to his diuretic regimen. He also reports today that his legs remain swollen and are weeping. He says they are more red and painful.   Plan: I will pass this information along to [name] providers and have scheduled a visit to see him at home tomorrow to further evaluate his condition. I will update his providers again at that time.    [place] [facility]  [phone]

## 2015-12-25 NOTE — Telephone Encounter (Signed)
Medication called to pharmacy. 

## 2015-12-25 NOTE — Telephone Encounter (Signed)
Okay to refill? 

## 2015-12-25 NOTE — Telephone Encounter (Signed)
Ok to refill??  Last office visit 12/19/2015.  Last refill 10/09/2015.

## 2015-12-26 ENCOUNTER — Other Ambulatory Visit: Payer: Self-pay | Admitting: *Deleted

## 2015-12-26 DIAGNOSIS — Z794 Long term (current) use of insulin: Secondary | ICD-10-CM | POA: Diagnosis not present

## 2015-12-26 DIAGNOSIS — I872 Venous insufficiency (chronic) (peripheral): Secondary | ICD-10-CM | POA: Diagnosis not present

## 2015-12-26 DIAGNOSIS — I5030 Unspecified diastolic (congestive) heart failure: Secondary | ICD-10-CM | POA: Diagnosis not present

## 2015-12-26 DIAGNOSIS — I110 Hypertensive heart disease with heart failure: Secondary | ICD-10-CM | POA: Diagnosis not present

## 2015-12-26 DIAGNOSIS — Z7984 Long term (current) use of oral hypoglycemic drugs: Secondary | ICD-10-CM | POA: Diagnosis not present

## 2015-12-26 DIAGNOSIS — L97929 Non-pressure chronic ulcer of unspecified part of left lower leg with unspecified severity: Secondary | ICD-10-CM | POA: Diagnosis not present

## 2015-12-26 DIAGNOSIS — E119 Type 2 diabetes mellitus without complications: Secondary | ICD-10-CM | POA: Diagnosis not present

## 2015-12-26 DIAGNOSIS — Z4800 Encounter for change or removal of nonsurgical wound dressing: Secondary | ICD-10-CM | POA: Diagnosis not present

## 2015-12-26 DIAGNOSIS — J449 Chronic obstructive pulmonary disease, unspecified: Secondary | ICD-10-CM | POA: Diagnosis not present

## 2015-12-26 DIAGNOSIS — K7581 Nonalcoholic steatohepatitis (NASH): Secondary | ICD-10-CM | POA: Diagnosis not present

## 2015-12-26 DIAGNOSIS — Z79891 Long term (current) use of opiate analgesic: Secondary | ICD-10-CM | POA: Diagnosis not present

## 2015-12-26 NOTE — Patient Outreach (Signed)
[facility]) Care Management   12/26/2015  Michael [name] [birth year] [id number]  Michael [name] is an 70 y.o. male with chronic diastolic heart failure with EF 60-65%, history of bradycardia, type II Diabetes, Hypertension, Iron Deficiency Anemia, NASH + Hepatomegaly, and degeneratove disc disease s/p surgery of L2-L3.   Michael [name] was admitted to the hospital in February with Acute/Chronic Diastolic Heart Failure and worsening lymphedema. Thereafter, he spent 21 days at [facility]. Michael [name] was referred to [place] [facility] for assistance with management of CHF.   Subjective: "I just wasn't sure about these legs so I wanted you to check them.,"  Objective:   Review of Systems  Constitutional: Negative.   HENT: Negative.   Eyes: Negative.   Respiratory: Negative for cough, sputum production, shortness of breath and wheezing.   Cardiovascular: Positive for leg swelling. Negative for chest pain and palpitations.  Gastrointestinal: Negative.   Genitourinary: Negative.   Musculoskeletal: Positive for myalgias. Negative for falls.  Skin: Positive for rash.  Neurological: Negative.   Psychiatric/Behavioral: Negative.     Physical Exam  Constitutional: He is oriented to person, place, and time. Vital signs are normal. He appears well-developed and well-nourished. He is active.  Cardiovascular: Normal rate and regular rhythm.   Respiratory: Effort normal and breath sounds normal.  GI: Soft. Bowel sounds are normal.  Neurological: He is alert and oriented to person, place, and time.  Skin: Skin is warm. Rash noted. Rash is vesicular. There is erythema.     Psychiatric: He has a normal mood and affect. His speech is normal and behavior is normal. Judgment and thought content normal. Cognition and memory are normal.    Current Medications:   Current Outpatient Prescriptions  Medication Sig Dispense Refill  . ACCU-CHEK AVIVA PLUS test strip     . aspirin EC 81 MG  tablet Take 81 mg by mouth daily.    . B-D ULTRAFINE III SHORT PEN 31G X 8 MM MISC USE AS DIRECTED DAILY WITH LANTUS. 100 each 0  . benazepril (LOTENSIN) 40 MG tablet TAKE ONE TABLET BY MOUTH DAILY. 90 tablet 0  . bumetanide (BUMEX) 2 MG tablet Take 1.5 tablets (3 mg total) by mouth 2 (two) times daily. 135 tablet 3  . BYSTOLIC 10 MG tablet TAKE ONE TABLET BY MOUTH DAILY. 30 tablet 6  . clobetasol cream (TEMOVATE) 0.05 % APPLY TO AFFECTED AREA 2 TIMES DAILY. 45 g 11  . diazepam (VALIUM) 5 MG tablet TAKE 1 TABLET BY MOUTH AT BEDTIME AS NEEDED FOR ANXIETY. 30 tablet 0  . diclofenac sodium (VOLTAREN) 1 % GEL Apply 4 g topically 4 (four) times daily. 5 Tube 5  . Ferrous Sulfate (IRON) 325 (65 FE) MG TABS Take 1 tablet by mouth daily.     Marland Kitchen gabapentin (NEURONTIN) 400 MG capsule TAKE ONE CAPSULE BY MOUTH THREE TIMES DAILY. 90 capsule 3  . LANTUS SOLOSTAR 100 UNIT/ML Solostar Pen INJECT 30 UNITS SUBCUTANEOUSLY AT BEDTIME. 15 mL 0  . levothyroxine (SYNTHROID, LEVOTHROID) 88 MCG tablet Take 1 tablet (88 mcg total) by mouth daily. Take 1 tab PO Q AM 30 minutes prior to eating or taking any other medications. 30 tablet 3  . Magnesium 400 MG TABS Take 400 mg by mouth 2 (two) times daily. 60 tablet 6  . meloxicam (MOBIC) 15 MG tablet Take 1 tablet (15 mg total) by mouth daily.    . metFORMIN (GLUCOPHAGE) 1000 MG tablet TAKE ONE TABLET BY MOUTH TWICE DAILY  WITH A MEAL. 180 tablet 3  . metolazone (ZAROXOLYN) 2.5 MG tablet Take 1 tab by mouth once weekly 5 tablet 3  . mupirocin cream (BACTROBAN) 2 % Apply 1 application topically 2 (two) times daily. To legs as needed 30 g 0  . nystatin cream (MYCOSTATIN) Apply 1 application topically 2 (two) times daily. 45 g 1  . omeprazole (PRILOSEC) 20 MG capsule TAKE ONE CAPSULE BY MOUTH TWICE DAILY. 60 capsule 11  . oxyCODONE-acetaminophen (PERCOCET) 7.5-325 MG tablet Take one tablet by mouth every 6 hours as needed for severe pain. Max APAP 3gm/24hrs from all sources 120  tablet 0  . potassium chloride (K-DUR) 10 MEQ tablet Take 2 tablets in the AM / Take 1 tablet in the PM (Patient taking differently: 20 mEq daily. Take 2 tablets in the AM / Take 1 tablet in the PM) 90 tablet 3  . pravastatin (PRAVACHOL) 40 MG tablet TAKE ONE TABLET BY MOUTH DAILY. 90 tablet 0    Assessment:  Michael [name] is a 70 year old gentleman who was referred to [facility] for assistance with management of CHF and DM. I saw him at home on 3/9 at which time he was found to have 2-3+ bilateral lower extremity edema, redness, and warmth. His weight was up 15# since discharge from SNF in late January (198# > 213#); he was taking Bumex as ordered but I was concerned about how strictly he was adhering to his regimen. I contacted the provider office and Metolazone as added to his regimen.  Yesterday, Michael [name] said in our phone conversation that his legs seemed to be worse and were weeping and his weight was unchanged. I reached out to Dr. Harl Bowie who ordered Metolazone x 3 days starting yesterday. The nurse tried to contact Michael [name] but was unable to reach him and was unable to leave a voice mail for him. I gave Michael [name] Michael [name] instructions to take Metolazone x 3 days. He had already taken one tablet this morning and will take another tomorrow. He reports good result/frequent urination after this morning's dose.   Compared with the 12/07/15 assessment, Michael [name] legs appear today generally improved with the exception of the 2 ruptured vesicles. They are less swollen. The color may be slightly more pink. Some of the areas of swelling, particularly around the ankles, are firm to touch, more consistent with lymphedema. The pretibial edema and edema of the lower leg is easy to compress and pits easily to 1-2+.   Michael [name] home health occupational therapist was present during my brief visit and provided the following measurements he'd taken on previous visits:      3/9: Right Calf  = 44cm /  Right Ankle = 37cm ///////  Left Calf = 44 1/2cm / Left Ankle = 38cm 3/21: Right Calf = 45cm / Right Ankle = 36cm ///////   Left Calf = 44cm / Left Ankle = 36cm    Plan:   Mr. Moyers will weigh himself daily, record, and call his cardiology provider for weight gain of #3 overnight or 5# in a week.   Mr. Jha will see Dr. Harl Bowie in the office tomorrow.    I will see Mr. Coleman at home on 01/02/16 @ 10am.  Mr. Horrigan will continue to take all medications daily as prescribed.   Mr. Orndoff will check cbg's daily.   [facility]  [phone]

## 2015-12-27 ENCOUNTER — Ambulatory Visit (INDEPENDENT_AMBULATORY_CARE_PROVIDER_SITE_OTHER): Payer: Medicare Other | Admitting: Cardiology

## 2015-12-27 ENCOUNTER — Encounter: Payer: Self-pay | Admitting: Cardiology

## 2015-12-27 ENCOUNTER — Ambulatory Visit: Payer: Medicare Other | Admitting: *Deleted

## 2015-12-27 VITALS — BP 111/69 | HR 74 | Ht 64.0 in | Wt 209.0 lb

## 2015-12-27 DIAGNOSIS — I10 Essential (primary) hypertension: Secondary | ICD-10-CM | POA: Diagnosis not present

## 2015-12-27 DIAGNOSIS — R600 Localized edema: Secondary | ICD-10-CM | POA: Diagnosis not present

## 2015-12-27 NOTE — Patient Instructions (Signed)
Your physician recommends that you schedule a follow-up appointment in: 2 weeks with Dr. Harl Bowie  Your physician has recommended you make the following change in your medication:   [place]  Your physician recommends that you return for lab work Monday 01/01/16 - BMP. MG - WE HAVE GIVEN YOU ORDERS FOR LABS  Thank you for choosing [facility]!!

## 2015-12-27 NOTE — Progress Notes (Signed)
Patient ID: Michael [name], male   DOB: 06-05-1946, 70 y.o.   MRN: [id number]     Clinical Summary [name] is a 70 y.o.male seen today for follow up of the following medical problems.   1. Chronic diastolic heart failure - echo 05/2015 with normal LVEF 60-65%, grade II diastolic dysfunction - Jan 2017 echo LVEF 61-44%, grade I diastolic dysfunction - Weight up 195 to 211 since hospital discharge. - edema likely combined diastolic HF and lymphedema - good response to metolazone in the past, but developed significant hyponatremia.   - last visit we increased his bumex to 30m bid, his swelling and weight did not improved. Started on metolazone, he was to take 2.534mx 3 days then start taking once weekly. He has taken 2 doses so far, weight down 5 lbs. Still with significant LE edema. No significant SOB or DOE   Past Medical History  Diagnosis Date  . COPD (chronic obstructive pulmonary disease) ([facility]  . Type 2 diabetes mellitus ([facility] [place]  . Hyperlipidemia   . Essential hypertension   . Iron deficiency anemia   . NASH (nonalcoholic steatohepatitis)   . Diabetic neuropathy ([facility]  . GERD (gastroesophageal reflux disease)   . Arthritis   . Depression   . Diastolic dysfunction   . Left knee DJD   . Lumbar spinal stenosis   . DDD (degenerative disc disease), lumbosacral   . Peripheral edema      No Known Allergies   Current Outpatient Prescriptions  Medication Sig Dispense Refill  . ACCU-CHEK AVIVA PLUS test strip     . aspirin EC 81 MG tablet Take 81 mg by mouth daily.    . B-D ULTRAFINE III SHORT PEN 31G X 8 MM MISC USE AS DIRECTED DAILY WITH LANTUS. 100 each 0  . benazepril (LOTENSIN) 40 MG tablet TAKE ONE TABLET BY MOUTH DAILY. 90 tablet 0  . bumetanide (BUMEX) 2 MG tablet Take 1.5 tablets (3 mg total) by mouth 2 (two) times daily. 135 tablet 3  . BYSTOLIC 10 MG tablet TAKE ONE TABLET BY MOUTH DAILY. 30 tablet 6  . clobetasol cream (TEMOVATE) 0.05 % APPLY TO AFFECTED AREA  2 TIMES DAILY. 45 g 11  . diazepam (VALIUM) 5 MG tablet TAKE 1 TABLET BY MOUTH AT BEDTIME AS NEEDED FOR ANXIETY. 30 tablet 0  . diclofenac sodium (VOLTAREN) 1 % GEL Apply 4 g topically 4 (four) times daily. 5 Tube 5  . Ferrous Sulfate (IRON) 325 (65 FE) MG TABS Take 1 tablet by mouth daily.     . Marland Kitchenabapentin (NEURONTIN) 400 MG capsule TAKE ONE CAPSULE BY MOUTH THREE TIMES DAILY. 90 capsule 3  . LANTUS SOLOSTAR 100 UNIT/ML Solostar Pen INJECT 30 UNITS SUBCUTANEOUSLY AT BEDTIME. 15 mL 0  . levothyroxine (SYNTHROID, LEVOTHROID) 88 MCG tablet Take 1 tablet (88 mcg total) by mouth daily. Take 1 tab PO Q AM 30 minutes prior to eating or taking any other medications. 30 tablet 3  . Magnesium 400 MG TABS Take 400 mg by mouth 2 (two) times daily. 60 tablet 6  . meloxicam (MOBIC) 15 MG tablet Take 1 tablet (15 mg total) by mouth daily.    . metFORMIN (GLUCOPHAGE) 1000 MG tablet TAKE ONE TABLET BY MOUTH TWICE DAILY WITH A MEAL. 180 tablet 3  . metolazone (ZAROXOLYN) 2.5 MG tablet Take 1 tab by mouth once weekly 5 tablet 3  . mupirocin cream (BACTROBAN) 2 % Apply 1 application topically 2 (two) times daily.  To legs as needed 30 g 0  . nystatin cream (MYCOSTATIN) Apply 1 application topically 2 (two) times daily. 45 g 1  . omeprazole (PRILOSEC) 20 MG capsule TAKE ONE CAPSULE BY MOUTH TWICE DAILY. 60 capsule 11  . oxyCODONE-acetaminophen (PERCOCET) 7.5-325 MG tablet Take one tablet by mouth every 6 hours as needed for severe pain. Max APAP 3gm/24hrs from all sources 120 tablet 0  . potassium chloride (K-DUR) 10 MEQ tablet Take 2 tablets in the AM / Take 1 tablet in the PM (Patient taking differently: 20 mEq daily. Take 2 tablets in the AM / Take 1 tablet in the PM) 90 tablet 3  . pravastatin (PRAVACHOL) 40 MG tablet TAKE ONE TABLET BY MOUTH DAILY. 90 tablet 0   No current facility-administered medications for this visit.     Past Surgical History  Procedure Laterality Date  . Hernia repair    . Shoulder  surgery      Rght-rotator cuff  . Lipoma removal      Stomach  . Knee arthroscopy with medial menisectomy Left 11/06/2012    Procedure: KNEE ARTHROSCOPY WITH MEDIAL MENISECTOMY;  Surgeon: Carole Civil, MD;  Location: [facility];  Service: Orthopedics;  Laterality: Left;  . Lumbar laminectomy/decompression microdiscectomy Left 08/30/2013    Procedure: LUMBAR LAMINECTOMY/DECOMPRESSION MICRODISCECTOMY LEFT  LUMBAR TWO THREE;  Surgeon: Otilio Connors, MD;  Location: [facility];  Service: Neurosurgery;  Laterality: Left;  . Egd with enteroscopy  2011    [facility]: normal esophagus and stomach. Normal duodenum, jejunum. No evidence of AVMs.   . Colonoscopy  2011    [facility]: normal colon, normal distal ileum  . Colonoscopy with propofol N/A 07/21/2014    Procedure: ATTEMPTED COLONOSCOPY WITH PROPOFOL-HAD TO STOP DUE TO BRADYCARDIA;  Surgeon: Daneil Dolin, MD;  Location: [facility];  Service: Endoscopy;  Laterality: N/A;  . Esophagogastroduodenoscopy (egd) with propofol N/A 07/21/2014    Procedure: ESOPHAGOGASTRODUODENOSCOPY (EGD) WITH PROPOFOL;  Surgeon: Daneil Dolin, MD;  Location: [facility];  Service: Endoscopy;  Laterality: N/A;  . Biopsy N/A 07/21/2014    Procedure: BIOPSY;  Surgeon: Daneil Dolin, MD;  Location: [facility];  Service: Endoscopy;  Laterality: N/A;     No Known Allergies    Family History  Problem Relation Age of Onset  . Heart disease Mother   . Hyperlipidemia Mother   . Hypertension Mother   . Depression Mother   . Diabetes Mother   . Rectal cancer Mother   . Cancer Mother   . Heart disease Father   . Hypertension Father   . Hyperlipidemia Father   . Diabetes Father   . Cancer Father   . Heart disease Sister   . Hyperlipidemia Sister   . Hypertension Sister   . Diabetes Sister   . Diabetes Brother   . Heart disease Sister   . Hyperlipidemia Sister   . Hypertension Sister   . Heart disease Sister   . Hyperlipidemia Sister   . Hypertension Sister   .  Diabetes Sister   . Diabetes Brother      Social History [name] reports that he quit smoking about 16 years ago. His smoking use included Cigarettes. He has a 110 pack-year smoking history. He quit smokeless tobacco use about 16 years ago. Mr. Arko reports that he does not drink alcohol.   Review of Systems CONSTITUTIONAL: No weight loss, fever, chills, weakness or fatigue.  HEENT: Eyes: No visual loss, blurred vision, double vision  or yellow sclerae.No hearing loss, sneezing, congestion, runny nose or sore throat.  SKIN: No rash or itching.  CARDIOVASCULAR: per hpi RESPIRATORY: No shortness of breath, cough or sputum.  GASTROINTESTINAL: No anorexia, nausea, vomiting or diarrhea. No abdominal pain or blood.  GENITOURINARY: No burning on urination, no polyuria NEUROLOGICAL: No headache, dizziness, syncope, paralysis, ataxia, numbness or tingling in the extremities. No change in bowel or bladder control.  MUSCULOSKELETAL: No muscle, back pain, joint pain or stiffness.  LYMPHATICS: No enlarged nodes. No history of splenectomy.  PSYCHIATRIC: No history of depression or anxiety.  ENDOCRINOLOGIC: No reports of sweating, cold or heat intolerance. No polyuria or polydipsia.  Marland Kitchen   Physical Examination Filed Vitals:   12/27/15 1312  BP: 111/69  Pulse: 74   Filed Vitals:   12/27/15 1312  Height: 5' 4"  (1.626 m)  Weight: 209 lb (94.802 kg)    Gen: resting comfortably, no acute distress HEENT: no scleral icterus, pupils equal round and reactive, no palptable cervical adenopathy,  CV: RRR, no m/r,g no jvd Resp: Clear to auscultation bilaterally GI: abdomen is soft, non-tender, non-distended, normal bowel sounds, no hepatosplenomegaly MSK: extremities are warm, 2+ bilateral edema Skin: warm, no rash Neuro:  no focal deficits Psych: appropriate affect   Diagnostic Studies 05/2015 echo Study Conclusions  - Left ventricle: The cavity size was normal. Wall thickness  was increased in a pattern of mild LVH. Systolic function was normal. The estimated ejection fraction was in the range of 60% to 65%. Wall motion was normal; there were no regional wall motion abnormalities. Features are consistent with a pseudonormal left ventricular filling pattern, with concomitant abnormal relaxation and increased filling pressure (grade 2 diastolic dysfunction). Doppler parameters are consistent with high ventricular filling pressure. - Aortic valve: Mildly calcified annulus. - Mitral valve: Mildly calcified annulus. There was trivial regurgitation. - Left atrium: The atrium was mildly to moderately dilated. Volume/bsa, S: 34.4 ml/m^2. Volume/bsa, ES (1-plane Simpson&'s, A4C): 31.3 ml/m^2.  Jan 2017 Study Conclusions  - Left ventricle: The cavity size was mildly dilated. Wall thickness was increased in a pattern of mild LVH. Systolic function was normal. The estimated ejection fraction was in the range of 50% to 55%. Wall motion was normal; there were no regional wall motion abnormalities. Doppler parameters are consistent with abnormal left ventricular relaxation (grade 1 diastolic dysfunction). - Mitral valve: Calcified annulus. Mildly thickened leaflets . - Atrial septum: No defect or patent foramen ovale was identified.     Assessment and Plan   1. LE edema - likely combination of lymphedema and acute on chronic diastolic HF - did not responsd to lasix or torsemide, currently on bumex. Limited response, we have started him on 3 days of metolazone 2.48m daily x 3 days then weekly after that - we will repeat labs early next week.  - if poor response to metolazone, may need to consider admission or IV diuresis.    F/u 2 weeks.      JArnoldo Lenis M.D.

## 2015-12-28 ENCOUNTER — Encounter: Payer: Self-pay | Admitting: *Deleted

## 2015-12-28 ENCOUNTER — Other Ambulatory Visit: Payer: Self-pay | Admitting: *Deleted

## 2015-12-28 DIAGNOSIS — Z4800 Encounter for change or removal of nonsurgical wound dressing: Secondary | ICD-10-CM | POA: Diagnosis not present

## 2015-12-28 DIAGNOSIS — Z794 Long term (current) use of insulin: Secondary | ICD-10-CM | POA: Diagnosis not present

## 2015-12-28 DIAGNOSIS — I5030 Unspecified diastolic (congestive) heart failure: Secondary | ICD-10-CM | POA: Diagnosis not present

## 2015-12-28 DIAGNOSIS — L97929 Non-pressure chronic ulcer of unspecified part of left lower leg with unspecified severity: Secondary | ICD-10-CM | POA: Diagnosis not present

## 2015-12-28 DIAGNOSIS — I872 Venous insufficiency (chronic) (peripheral): Secondary | ICD-10-CM | POA: Diagnosis not present

## 2015-12-28 DIAGNOSIS — J449 Chronic obstructive pulmonary disease, unspecified: Secondary | ICD-10-CM | POA: Diagnosis not present

## 2015-12-28 DIAGNOSIS — K7581 Nonalcoholic steatohepatitis (NASH): Secondary | ICD-10-CM | POA: Diagnosis not present

## 2015-12-28 DIAGNOSIS — Z79891 Long term (current) use of opiate analgesic: Secondary | ICD-10-CM | POA: Diagnosis not present

## 2015-12-28 DIAGNOSIS — I110 Hypertensive heart disease with heart failure: Secondary | ICD-10-CM | POA: Diagnosis not present

## 2015-12-28 DIAGNOSIS — Z7984 Long term (current) use of oral hypoglycemic drugs: Secondary | ICD-10-CM | POA: Diagnosis not present

## 2015-12-28 DIAGNOSIS — E119 Type 2 diabetes mellitus without complications: Secondary | ICD-10-CM | POA: Diagnosis not present

## 2015-12-28 NOTE — Patient Outreach (Signed)
[facility]) Care Management  12/28/2015  BEAR OSTEN 10/16/1945 [id number]  I reached out to Mr. Haberman today to follow up on his cardiology visit and progress with edema since taking extra diuretic as prescribed by Dr. Harl Bowie. Mr. Finnan has taken extra Metolazone over the last 3 days. He lost 2 pounds from Tuesday morning to Wednesday morning and 5 pounds overnight last night. He has lost a total of 7# over the last approximately 72 hours.  Mr. Trier says his legs are still swollen but do not appear worse to him. He denied chest pain, worsened shortness of breath, lightheadedness/dizziness, or other new or worsened symptom.   Plan: I will call Mr. Dibert tomorrow afternoon to get a report on his weight and symptoms. I will see Mr. Mcadams at home next week for previously scheduled visit and will perform venipuncture for labs ordered by Dr. Harl Bowie.   Mr. Deason will call me tomorrow for any new or worsened symptoms or if he has a question. He will call our 24/7 on call nurse line over the weekend for any questions or concerns but will call 911 if he becomes acutely short of breath, has chest pain or any other acute onset/severe symptom.    [facility] [facility]  [phone]

## 2015-12-29 ENCOUNTER — Other Ambulatory Visit: Payer: Self-pay | Admitting: *Deleted

## 2015-12-29 NOTE — Patient Outreach (Signed)
[facility]) Care Management  12/29/2015  Michael [name] 1946/07/06 [id number]   I spoke with Michael [name] today to follow up on CHF symptoms. He has lost one more pound in response to Metolazone x 3 days in addition to his regularly prescribed medications and diuretics. Michael [name] says his legs are still swollen and weeping (3 places) but are not worse. He is still voiding without difficulty although the volume has decreased. Michael [name] denies chest pain, shortness of breath, or new or worsened symptoms.   Plan: I will see Michael [name] on Tuesday as scheduled and will perform venipuncture at that time as ordered by Dr. Harl Bowie.  Michael [name] will call our 24/7 nurse line over the weekend if he has questions or mild/new symptoms. He will report to the ED if he has acute or severe symptoms of any kind.    [place] [facility]  [phone]

## 2016-01-02 ENCOUNTER — Encounter: Payer: Self-pay | Admitting: *Deleted

## 2016-01-02 ENCOUNTER — Other Ambulatory Visit: Payer: Self-pay | Admitting: *Deleted

## 2016-01-02 DIAGNOSIS — M7650 Patellar tendinitis, unspecified knee: Secondary | ICD-10-CM

## 2016-01-02 DIAGNOSIS — I10 Essential (primary) hypertension: Secondary | ICD-10-CM | POA: Diagnosis not present

## 2016-01-02 MED ORDER — DICLOFENAC SODIUM 1 % TD GEL: 4.0000 g | Freq: Four times a day (QID) | TRANSDERMAL | Status: DC

## 2016-01-02 NOTE — Patient Outreach (Signed)
[facility]) Care Management   01/02/2016  Michael [name] 07/07/1946 [id number]  Michael [name] is an 70 y.o. male with chronic diastolic heart failure with EF 60-65%, history of bradycardia, type II Diabetes, Hypertension, Iron Deficiency Anemia, NASH + Hepatomegaly, and degeneratove disc disease s/p surgery of L2-L3.   Michael [name] was admitted to the hospital in February with Acute/Chronic Diastolic Heart Failure and worsening lymphedema. Thereafter, he spent 21 days at [facility]. Michael [name] was referred to [facility] for assistance with management of CHF.   Subjective: "I think I'm doing better. My swelling is down a little bit in my legs and I don't have any more blisters."  Objective:  BP 100/60 mmHg  Pulse 71  Ht 1.626 m (5' 4" )  Wt 203 lb (92.08 kg)  BMI 34.83 kg/m2  SpO2 98%  Review of Systems  Constitutional: Negative for fever, chills and malaise/fatigue.  HENT: Negative.   Eyes: Negative.   Respiratory: Negative for cough, sputum production and shortness of breath.   Cardiovascular: Positive for leg swelling.       Areas of firm swelling around ankles and top of feet consistent with lymphedema; 2+ pretibial swelling consistent with edema - improved  Gastrointestinal: Negative.   Genitourinary: Positive for frequency.       Reports frequency over last 3 days with increased dose of diuretic  Musculoskeletal: Positive for myalgias. Negative for falls.  Skin: Positive for rash.       Bilateral lower extremities with circumferential mild redness; no vesicles noted; decrease in edema  Neurological: Negative.  Negative for dizziness and weakness.  Psychiatric/Behavioral: Negative.     Physical Exam  Constitutional: He is oriented to person, place, and time. Vital signs are normal. He appears well-developed and well-nourished. He is active.  Cardiovascular: Normal rate, regular rhythm, S1 normal and S2 normal.   No murmur heard. Respiratory: Effort  normal and breath sounds normal. He has no wheezes. He has no rhonchi. He has no rales.  GI: Soft. Bowel sounds are normal.  Neurological: He is alert and oriented to person, place, and time.  Skin: Skin is warm and dry. There is erythema.     Psychiatric: He has a normal mood and affect. His speech is normal and behavior is normal. Judgment and thought content normal. Cognition and memory are normal.    Encounter Medications:   Outpatient Encounter Prescriptions as of 01/02/2016  Medication Sig Note  . ACCU-CHEK AVIVA PLUS test strip  04/20/2015: Received from: External Pharmacy  . aspirin EC 81 MG tablet Take 81 mg by mouth daily.   . B-D ULTRAFINE III SHORT PEN 31G X 8 MM MISC USE AS DIRECTED DAILY WITH LANTUS.   Marland Kitchen benazepril (LOTENSIN) 40 MG tablet TAKE ONE TABLET BY MOUTH DAILY.   . bumetanide (BUMEX) 2 MG tablet Take 1.5 tablets (3 mg total) by mouth 2 (two) times daily.   Marland Kitchen BYSTOLIC 10 MG tablet TAKE ONE TABLET BY MOUTH DAILY.   . clobetasol cream (TEMOVATE) 0.05 % APPLY TO AFFECTED AREA 2 TIMES DAILY.   . diazepam (VALIUM) 5 MG tablet TAKE 1 TABLET BY MOUTH AT BEDTIME AS NEEDED FOR ANXIETY.   . diclofenac sodium (VOLTAREN) 1 % GEL Apply 4 g topically 4 (four) times daily. (Patient not taking: Reported on 12/27/2015)   . Ferrous Sulfate (IRON) 325 (65 FE) MG TABS Take 1 tablet by mouth daily.    Marland Kitchen gabapentin (NEURONTIN) 400 MG capsule TAKE ONE CAPSULE BY  MOUTH THREE TIMES DAILY.   Marland Kitchen LANTUS SOLOSTAR 100 UNIT/ML Solostar Pen INJECT 30 UNITS SUBCUTANEOUSLY AT BEDTIME.   Marland Kitchen levothyroxine (SYNTHROID, LEVOTHROID) 88 MCG tablet Take 1 tablet (88 mcg total) by mouth daily. Take 1 tab PO Q AM 30 minutes prior to eating or taking any other medications.   . Magnesium 400 MG TABS Take 400 mg by mouth 2 (two) times daily.   . meloxicam (MOBIC) 15 MG tablet Take 1 tablet (15 mg total) by mouth daily.   . metFORMIN (GLUCOPHAGE) 1000 MG tablet TAKE ONE TABLET BY MOUTH TWICE DAILY WITH A MEAL.   Marland Kitchen  metolazone (ZAROXOLYN) 2.5 MG tablet Take 1 tab by mouth once weekly   . mupirocin cream (BACTROBAN) 2 % Apply 1 application topically 2 (two) times daily. To legs as needed   . nystatin cream (MYCOSTATIN) Apply 1 application topically 2 (two) times daily.   Marland Kitchen omeprazole (PRILOSEC) 20 MG capsule TAKE ONE CAPSULE BY MOUTH TWICE DAILY.   Marland Kitchen oxyCODONE-acetaminophen (PERCOCET) 7.5-325 MG tablet Take one tablet by mouth every 6 hours as needed for severe pain. Max APAP 3gm/24hrs from all sources   . potassium chloride (K-DUR) 10 MEQ tablet Take 2 tablets in the AM / Take 1 tablet in the PM (Patient taking differently: 20 mEq daily. Take 2 tablets in the AM / Take 1 tablet in the PM)   . pravastatin (PRAVACHOL) 40 MG tablet TAKE ONE TABLET BY MOUTH DAILY.    Assessment:    Acute/Chronic Health Condition (CHF) with peripheral edema - During my first home visit 3/9 Michael [name] was found to have 2-3+ bilateral lower extremity edema with redness, and warmth. His weight was up 15# since discharge from SNF in late January (198# > 213#); he was taking Bumex as ordered; I contacted the provider office and Metolazone as added to his regimen. Over the next several days, Michael [name] reported that legs seemed to be worse and were weeping and his weight was unchanged. I reached out to Dr. Harl Bowie who ordered Metolazone x 3 days; Michael [name] lost about 7 pounds total.    Michael [name]'s home health occupational therapist provided the following measurements he'd taken on previous visits:   Today, Michael [name] lower extremities are much improved. No vesicles are noted and his swelling has decreased. Legs are less red as well. No change in appearance of areas affected by lymphedema.    Chronic Health Condition (DM) - Michael [name] noted an increase in [facility] after his hospitalization; he saw Dr. Buelah Manis in February when A1C was noted to be 6.3, up from 5.9; he is taking Metformin and Lantus as prescribed; Michael [name] has not  been checking cbg's regularly as evidenced by < 10 checks in the 30 days prior to when I first saw him at home; I encouraged Michael [name] to check his cbg's daily; Michael [name] knowledge of carb modified diet but I noted lots of candy (chocolates), cake, chips, crackers, and breads sitting out in the kitchen and by his recliner chair; we reviewed carb modified diet and I left reading material (Emmi); I offered to refer Michael [name] to the dietician and he said he would consider it  CBG today: 189   Plan:   Michael [name] will weigh himself daily, record, and call his cardiology provider for weight gain of #3 overnight or 5# in a week.   Michael [name] will continue to take all medications daily as prescribed.   Michael [name] will check  cbg's daily.  I will follow up on labs drawn today and assure that results are received by Dr. Harl Bowie.   I will see Mr. Minasyan again in 2 weeks unless he has new or worsened symptoms.    [facility] CM Care Plan Problem One        Most Recent Value   Care Plan Problem One  Acute/Chronic Health Condition (Swelling/Rash lower extremities with 15# weight gain)   Role Documenting the Problem One  Care Management [place] for Problem One  Active   [facility] Long Term Goal (31-90 days)  Over the next 60 days, patient will verbalize understanding of plan of care for treatment of peripheral edema and weight gain [met previous month,  still stabilizing volume,  re-established]   [facility] Long Term Goal Start Date  01/02/16   Interventions for Problem One Long Term Goal  Utilizing teachback method, reviewed rationale for established plan of care and self health management activities,  notified cardiology provider of new symptoms,  reviewed use of blue [facility] Care Management notebook as self health management tool   [facility] CM Short Term Goal #1 (0-30 days)  Over the next 21 days, patient will weigh himself daily, record, and call provdier for weight gain of 3# overnight/5# in a  week or new or worsening CHF symptoms   [facility] CM Short Term Goal #1 Start Date  01/02/16 [met,  re-established secondary to ongoing changes to regimen]   Interventions for Short Term Goal #1  Utilizing teachback method and stop light tool, reviewed signs and symtpoms of CHF and lymphedema with patient,  reviewed rationale for daily weights   [facility] CM Short Term Goal #2 (0-30 days)  Over the next 7 days,  patient will attend scheduled cardiology appointment   [facility] CM Short Term Goal #2 Start Date  12/07/15   [facility] CM Short Term Goal #2 Met Date  01/02/16   Interventions for Short Term Goal #2  Utliizing teachback method, reviewed next appointment date and helped patient calendar in new [facility] Patient Self Health Management Notebook   [facility] CM Short Term Goal #3 (0-30 days)  Over the next 7 days, patient will take all medications as prescribed, including any changes ordered by provider   [facility] CM Short Term Goal #3 Start Date  12/07/15   [facility] CM Short Term Goal #3 Met Date  01/02/16   Interventions for Short Tern Goal #3  Utilizing teachback method, reviewed all patient medications,  notified provider of symptoms    [facility] CM Care Plan Problem Two        Most Recent Value   Care Plan Problem Two  Chronic Health Condition/Self Health Management Deficits (DM)    Role Documenting the Problem Two  Care Management Coordinator   Care Plan for Problem Two  Active   Interventions for Problem Two Long Term Goal   Utilizing teachback method, reviewed rationale for formal established plan of care for DM management   [facility]N Long Term Goal (31-90) days  Over the next 31 days, patient will verbalize plan for self health management of DM   [facility] Long Term Goal Start Date  01/02/16 [met previous month,  re-established with ongoing changes]   [facility] CM Short Term Goal #1 (0-30 days)  Over the next 21 days, patient will check cbg's daily   [facility] CM Short Term Goal #1 Start Date  01/02/16 [not met,  re-established]   Interventions for Short  Term Goal #2  Utilizing teachback method, reviewed rationale and importance of daily cbg monitoring with patient   [facility] CM Short Term Goal #2 (0-30 days)  Over the next 21 days, patient will review carb modified diet literature and prepare list of questions for next visit   [facility] CM Short Term Goal #2 Start Date  12/07/15   [facility] CM Short Term Goal #2 Met Date  01/02/16   Interventions for Short Term Goal #2  Utilizing teachback method and [employer], reviewed prescribed carb modified diet with patient      [place] [facility]  [phone]

## 2016-01-05 ENCOUNTER — Telehealth: Payer: Self-pay | Admitting: *Deleted

## 2016-01-05 NOTE — Telephone Encounter (Signed)
Pt aware of lab results. Pt says weight is 208 lbs today and hasn't changed in last 3 days, says swelling is down and overall he feels much better. Will forward to Dr. Harl Bowie

## 2016-01-06 ENCOUNTER — Other Ambulatory Visit: Payer: Self-pay | Admitting: Family Medicine

## 2016-01-08 ENCOUNTER — Other Ambulatory Visit: Payer: Self-pay | Admitting: Family Medicine

## 2016-01-08 NOTE — Telephone Encounter (Signed)
Refill appropriate and filled per protocol. 

## 2016-01-09 NOTE — Telephone Encounter (Signed)
Refill appropriate and filled per protocol. 

## 2016-01-10 ENCOUNTER — Ambulatory Visit (INDEPENDENT_AMBULATORY_CARE_PROVIDER_SITE_OTHER): Payer: Medicare Other | Admitting: Cardiology

## 2016-01-10 ENCOUNTER — Encounter: Payer: Self-pay | Admitting: Cardiology

## 2016-01-10 VITALS — BP 122/68 | HR 91 | Ht 60.0 in | Wt 214.0 lb

## 2016-01-10 DIAGNOSIS — I5033 Acute on chronic diastolic (congestive) heart failure: Secondary | ICD-10-CM | POA: Diagnosis not present

## 2016-01-10 DIAGNOSIS — Z79899 Other long term (current) drug therapy: Secondary | ICD-10-CM

## 2016-01-10 MED ORDER — METOLAZONE 2.5 MG PO TABS: ORAL_TABLET | ORAL | Status: DC

## 2016-01-10 MED ORDER — MUPIROCIN 2 % EX OINT: 1.0000 "application " | TOPICAL_OINTMENT | Freq: Three times a day (TID) | CUTANEOUS | Status: DC

## 2016-01-10 NOTE — Patient Instructions (Signed)
Medication Instructions:  TAKE METOLAZONE 2.5 MG - TAKE 1 TABLET ON Monday,WEDNESDAY AND Friday.  START BACTROBAN OINTMENT 2% - APPLY TO LEGS 3 TIMES DAILY AS NEEDED  Labwork: Your physician recommends that you return for lab work in: [place]    Testing/Procedures: NONE  Follow-Up: Your physician recommends that you schedule a follow-up appointment in: 1 MONTH    Any Other Special Instructions Will Be Listed Below (If Applicable).     If you need a refill on your cardiac medications before your next appointment, please call your pharmacy.

## 2016-01-10 NOTE — Progress Notes (Signed)
Patient ID: Michael [name], male   DOB: [birth year], 70 y.o.   MRN: [id number]     Clinical Summary Michael [name] is a 70 y.o.male seen today for follow up of the following medical problems.   1. Chronic diastolic heart failure - echo 05/2015 with normal LVEF 60-65%, grade II diastolic dysfunction - Jan 2017 echo LVEF 56-21%, grade I diastolic dysfunction - Weight up 195 to 211 since hospital discharge. - edema likely combined diastolic HF and lymphedema - good response to metolazone in the past, but developed significant hyponatremia.   - he has required aggressive diruetic regimen. Recently bumex increased to 72m bid, he continued to swell on this regimen - last visit we had him take metolazone 2.533mx 3 days, then weekly after that. Weights dropped to 202 lbs after 3 day course, but have drifted back up to 213 lbs.   2. COPD - followed by pcp  3. Hx of NASH  4. Hypothyroid -follow by pcp   Past Medical History  Diagnosis Date  . COPD (chronic obstructive pulmonary disease) ([place]  . Type 2 diabetes mellitus ([place]  . Hyperlipidemia   . Essential hypertension   . Iron deficiency anemia   . NASH (nonalcoholic steatohepatitis)   . Diabetic neuropathy ([place]  . GERD (gastroesophageal reflux disease)   . Arthritis   . Depression   . Diastolic dysfunction   . Left knee DJD   . Lumbar spinal stenosis   . DDD (degenerative disc disease), lumbosacral   . Peripheral edema      No Known Allergies   Current Outpatient Prescriptions  Medication Sig Dispense Refill  . ACCU-CHEK AVIVA PLUS test strip     . aspirin EC 81 MG tablet Take 81 mg by mouth daily.    . B-D ULTRAFINE III SHORT PEN 31G X 8 MM MISC USE AS DIRECTED DAILY WITH LANTUS. 100 each 0  . benazepril (LOTENSIN) 40 MG tablet TAKE ONE TABLET BY MOUTH DAILY. 90 tablet 0  . bumetanide (BUMEX) 2 MG tablet Take 1.5 tablets (3 mg total) by mouth 2 (two) times daily. 135 tablet 3  . BYSTOLIC 10 MG tablet TAKE ONE TABLET BY  MOUTH DAILY. 30 tablet 6  . clobetasol cream (TEMOVATE) 0.05 % APPLY TO AFFECTED AREA 2 TIMES DAILY. 45 g 11  . diazepam (VALIUM) 5 MG tablet TAKE 1 TABLET BY MOUTH AT BEDTIME AS NEEDED FOR ANXIETY. 30 tablet 0  . diclofenac sodium (VOLTAREN) 1 % GEL Apply 4 g topically 4 (four) times daily. 5 Tube 5  . Ferrous Sulfate (IRON) 325 (65 FE) MG TABS Take 1 tablet by mouth daily.     . Marland Kitchenabapentin (NEURONTIN) 400 MG capsule TAKE ONE CAPSULE BY MOUTH THREE TIMES DAILY. 90 capsule 3  . LANTUS SOLOSTAR 100 UNIT/ML Solostar Pen INJECT 30 UNITS SUBCUTANEOUSLY AT BEDTIME. 15 mL 3  . levothyroxine (SYNTHROID, LEVOTHROID) 88 MCG tablet Take 1 tablet (88 mcg total) by mouth daily. Take 1 tab PO Q AM 30 minutes prior to eating or taking any other medications. 30 tablet 3  . Magnesium 400 MG TABS Take 400 mg by mouth 2 (two) times daily. 60 tablet 6  . meloxicam (MOBIC) 15 MG tablet Take 1 tablet (15 mg total) by mouth daily.    . metFORMIN (GLUCOPHAGE) 1000 MG tablet TAKE ONE TABLET BY MOUTH TWICE DAILY WITH A MEAL. 180 tablet 3  . metolazone (ZAROXOLYN) 2.5 MG tablet Take 1 tab by mouth once weekly  5 tablet 3  . mupirocin cream (BACTROBAN) 2 % Apply 1 application topically 2 (two) times daily. To legs as needed 30 g 0  . nystatin cream (MYCOSTATIN) Apply 1 application topically 2 (two) times daily. 45 g 1  . omeprazole (PRILOSEC) 20 MG capsule TAKE ONE CAPSULE BY MOUTH TWICE DAILY. 60 capsule 11  . oxyCODONE-acetaminophen (PERCOCET) 7.5-325 MG tablet Take one tablet by mouth every 6 hours as needed for severe pain. Max APAP 3gm/24hrs from all sources 120 tablet 0  . potassium chloride (K-DUR) 10 MEQ tablet Take 2 tablets in the AM / Take 1 tablet in the PM (Patient taking differently: 20 mEq daily. Take 2 tablets in the AM / Take 1 tablet in the PM) 90 tablet 3  . pravastatin (PRAVACHOL) 40 MG tablet TAKE ONE TABLET BY MOUTH DAILY. 90 tablet 0   No current facility-administered medications for this visit.      Past Surgical History  Procedure Laterality Date  . Hernia repair    . Shoulder surgery      Rght-rotator cuff  . Lipoma removal      Stomach  . Knee arthroscopy with medial menisectomy Left 11/06/2012    Procedure: KNEE ARTHROSCOPY WITH MEDIAL MENISECTOMY;  Surgeon: Carole Civil, MD;  Location: [facility];  Service: Orthopedics;  Laterality: Left;  . Lumbar laminectomy/decompression microdiscectomy Left 08/30/2013    Procedure: LUMBAR LAMINECTOMY/DECOMPRESSION MICRODISCECTOMY LEFT  LUMBAR TWO THREE;  Surgeon: Otilio Connors, MD;  Location: [place] [facility];  Service: Neurosurgery;  Laterality: Left;  . Egd with enteroscopy  2011    [facility]: normal esophagus and stomach. Normal duodenum, jejunum. No evidence of AVMs.   . Colonoscopy  2011    [facility]: normal colon, normal distal ileum  . Colonoscopy with propofol N/A 07/21/2014    Procedure: ATTEMPTED COLONOSCOPY WITH PROPOFOL-HAD TO STOP DUE TO BRADYCARDIA;  Surgeon: Daneil Dolin, MD;  Location: [facility];  Service: Endoscopy;  Laterality: N/A;  . Esophagogastroduodenoscopy (egd) with propofol N/A 07/21/2014    Procedure: ESOPHAGOGASTRODUODENOSCOPY (EGD) WITH PROPOFOL;  Surgeon: Daneil Dolin, MD;  Location: [facility];  Service: Endoscopy;  Laterality: N/A;  . Biopsy N/A 07/21/2014    Procedure: BIOPSY;  Surgeon: Daneil Dolin, MD;  Location: [facility];  Service: Endoscopy;  Laterality: N/A;     No Known Allergies    Family History  Problem Relation Age of Onset  . Heart disease Mother   . Hyperlipidemia Mother   . Hypertension Mother   . Depression Mother   . Diabetes Mother   . Rectal cancer Mother   . Cancer Mother   . Heart disease Father   . Hypertension Father   . Hyperlipidemia Father   . Diabetes Father   . Cancer Father   . Heart disease Sister   . Hyperlipidemia Sister   . Hypertension Sister   . Diabetes Sister   . Diabetes Brother   . Heart disease Sister   . Hyperlipidemia Sister   . Hypertension Sister    . Heart disease Sister   . Hyperlipidemia Sister   . Hypertension Sister   . Diabetes Sister   . Diabetes Brother      Social History Michael [name] reports that he quit smoking about 16 years ago. His smoking use included Cigarettes. He started smoking about 60 years ago. He has a 110 pack-year smoking history. He has never used smokeless tobacco. Michael [name] reports that he does not drink alcohol.  Review of Systems CONSTITUTIONAL: No weight loss, fever, chills, weakness or fatigue.  HEENT: Eyes: No visual loss, blurred vision, double vision or yellow sclerae.No hearing loss, sneezing, congestion, runny nose or sore throat.  SKIN: No rash or itching.  CARDIOVASCULAR: per HPI RESPIRATORY: No shortness of breath, cough or sputum.  GASTROINTESTINAL: No anorexia, nausea, vomiting or diarrhea. No abdominal pain or blood.  GENITOURINARY: No burning on urination, no polyuria NEUROLOGICAL: No headache, dizziness, syncope, paralysis, ataxia, numbness or tingling in the extremities. No change in bowel or bladder control.  MUSCULOSKELETAL: + leg swelling LYMPHATICS: No enlarged nodes. No history of splenectomy.  PSYCHIATRIC: No history of depression or anxiety.  ENDOCRINOLOGIC: No reports of sweating, cold or heat intolerance. No polyuria or polydipsia.  Marland Kitchen   Physical Examination Filed Vitals:   01/10/16 1341  BP: 122/68  Pulse: 91   Filed Vitals:   01/10/16 1341  Height: 5' (1.524 m)  Weight: 214 lb (97.07 kg)    Gen: resting comfortably, no acute distress HEENT: no scleral icterus, pupils equal round and reactive, no palptable cervical adenopathy,  CV: RRR, no m/r/g, no jvd Resp: Clear to auscultation bilaterally GI: abdomen is soft, non-tender, non-distended, normal bowel sounds, no hepatosplenomegaly MSK: extremities are warm, 2+ bilateral LE edema.  Skin: warm, no rash Neuro:  no focal deficits Psych: appropriate affect   Diagnostic Studies 05/2015 echo Study  Conclusions  - Left ventricle: The cavity size was normal. Wall thickness was increased in a pattern of mild LVH. Systolic function was normal. The estimated ejection fraction was in the range of 60% to 65%. Wall motion was normal; there were no regional wall motion abnormalities. Features are consistent with a pseudonormal left ventricular filling pattern, with concomitant abnormal relaxation and increased filling pressure (grade 2 diastolic dysfunction). Doppler parameters are consistent with high ventricular filling pressure. - Aortic valve: Mildly calcified annulus. - Mitral valve: Mildly calcified annulus. There was trivial regurgitation. - Left atrium: The atrium was mildly to moderately dilated. Volume/bsa, S: 34.4 ml/m^2. Volume/bsa, ES (1-plane Simpson&'s, A4C): 31.3 ml/m^2.  Jan 2017 Study Conclusions  - Left ventricle: The cavity size was mildly dilated. Wall thickness was increased in a pattern of mild LVH. Systolic function was normal. The estimated ejection fraction was in the range of 50% to 55%. Wall motion was normal; there were no regional wall motion abnormalities. Doppler parameters are consistent with abnormal left ventricular relaxation (grade 1 diastolic dysfunction). - Mitral valve: Calcified annulus. Mildly thickened leaflets . - Atrial septum: No defect or patent foramen ovale was identified.    Assessment and Plan  1. LE edema - likely combination of lymphedema and acute on chronic diastolic HF - fluid status has been very labile, management has been complicated by the lack of response to fairly aggressive loop diuretics with bumex dosed 58m bid. He responds very well to metolazone but with frequent dosing has developed troubles with hyponatremia - we will continue bumex 386mbid and try dosing metolazone 2.68m63mWF. We will ask for home health to draw a BMET and Mg in 2weeks.  - Rx for bactroban given for some  superficial leg breakdown/weeping.     F/u 2 weeks.       JonArnoldo Lenis.D.

## 2016-01-12 ENCOUNTER — Encounter (HOSPITAL_COMMUNITY): Payer: Self-pay

## 2016-01-12 ENCOUNTER — Other Ambulatory Visit: Payer: Self-pay | Admitting: *Deleted

## 2016-01-12 DIAGNOSIS — I10 Essential (primary) hypertension: Secondary | ICD-10-CM

## 2016-01-12 NOTE — Patient Outreach (Signed)
[facility]) Care Management  [place]  01/12/2016   PHENIX VANDERMEULEN 1946-09-06 [id number]  Subjective: [name] is a 70 year old male with chronic diastolic heart failure with EF 60-65%, history of bradycardia, type II Diabetes, Hypertension, Iron Deficiency Anemia, NASH + Hepatomegaly, and degeneratove disc disease s/p surgery of L2-L3.   Mr. Goodwyn was admitted to the hospital in February with Acute/Chronic Diastolic Heart Failure and worsening lymphedema. Thereafter, he spent 21 days at [facility]. Mr. Sermon was referred to [place] [facility] for assistance with management of CHF.   [facility] Care Management has been following Mr. Fullbright in the community since his discharge from SNF. Focus of care management services has been around CHF and DM Disease management with particular attention to volume management and peripheral edema/lymphedema and lower extremity skin ailments.   Objective:  Leg Image 01/12/16    Wt 213 lb (96.616 kg)  Encounter Medications:  Outpatient Encounter Prescriptions as of 01/12/2016  Medication Sig Note  . ACCU-CHEK AVIVA PLUS test strip  04/20/2015: Received from: External Pharmacy  . aspirin EC 81 MG tablet Take 81 mg by mouth daily.   . B-D ULTRAFINE III SHORT PEN 31G X 8 MM MISC USE AS DIRECTED DAILY WITH LANTUS.   Marland Kitchen benazepril (LOTENSIN) 40 MG tablet TAKE ONE TABLET BY MOUTH DAILY.   . bumetanide (BUMEX) 2 MG tablet Take 1.5 tablets (3 mg total) by mouth 2 (two) times daily.   Marland Kitchen BYSTOLIC 10 MG tablet TAKE ONE TABLET BY MOUTH DAILY.   . clobetasol cream (TEMOVATE) 0.05 % APPLY TO AFFECTED AREA 2 TIMES DAILY.   . diazepam (VALIUM) 5 MG tablet TAKE 1 TABLET BY MOUTH AT BEDTIME AS NEEDED FOR ANXIETY.   . diclofenac sodium (VOLTAREN) 1 % GEL Apply 4 g topically 4 (four) times daily.   . Ferrous Sulfate (IRON) 325 (65 FE) MG TABS Take 1 tablet by mouth daily.    Marland Kitchen gabapentin (NEURONTIN) 400 MG capsule TAKE ONE CAPSULE BY MOUTH  THREE TIMES DAILY.   Marland Kitchen LANTUS SOLOSTAR 100 UNIT/ML Solostar Pen INJECT 30 UNITS SUBCUTANEOUSLY AT BEDTIME.   Marland Kitchen levothyroxine (SYNTHROID, LEVOTHROID) 88 MCG tablet Take 1 tablet (88 mcg total) by mouth daily. Take 1 tab PO Q AM 30 minutes prior to eating or taking any other medications.   . Magnesium 400 MG TABS Take 400 mg by mouth 2 (two) times daily.   . meloxicam (MOBIC) 15 MG tablet Take 1 tablet (15 mg total) by mouth daily.   . metFORMIN (GLUCOPHAGE) 1000 MG tablet TAKE ONE TABLET BY MOUTH TWICE DAILY WITH A MEAL.   Marland Kitchen metolazone (ZAROXOLYN) 2.5 MG tablet Take 1 tablet on Monday, Wednesday and Friday.   . mupirocin cream (BACTROBAN) 2 % Apply 1 application topically 2 (two) times daily. To legs as needed   . mupirocin ointment (BACTROBAN) 2 % Apply 1 application topically 3 (three) times daily. APPLY TO LEGS 3 TIMES DAILY AS NEEDED   . nystatin cream (MYCOSTATIN) Apply 1 application topically 2 (two) times daily.   Marland Kitchen omeprazole (PRILOSEC) 20 MG capsule TAKE ONE CAPSULE BY MOUTH TWICE DAILY.   Marland Kitchen oxyCODONE-acetaminophen (PERCOCET) 7.5-325 MG tablet Take one tablet by mouth every 6 hours as needed for severe pain. Max APAP 3gm/24hrs from all sources   . potassium chloride (K-DUR) 10 MEQ tablet Take 2 tablets in the AM / Take 1 tablet in the PM (Patient taking differently: 20 mEq daily. Take 2 tablets in  the AM / Take 1 tablet in the PM)   . pravastatin (PRAVACHOL) 40 MG tablet TAKE ONE TABLET BY MOUTH DAILY.    Assessment: 70 year old gentleman living in [place] with ongoing volume management and skin problems. Mr. Teed is attending all scheduled provider appointments and last saw Dr. Harl Bowie on 01/10/16.   Chronic Health Condition (CHF) with peripheral edema and weeping- Mr. Livingood weight on discharge from acute care setting was 195#. His weight quickly went up to as high as 231# after discharge. He is on an aggressive diuretic regimen which has helped but only gotten his weight down to  210-215#  range. Today his weight is 211#. He denies shortness of breath, chest pain, worsened fatigue, or any symptoms other than peripheral edema and weeping.  Today, Mr. Peggs reports worsening leg swelling and weeping with open vesicles on the back of his left leg. His niece is helping him cleanse the area (gently with warm cloth) and apply Kenalog cream. Mr. Kohen has TED hose but has not been using them regularly because they are difficult for him to put on.   Plan:   I will reach out to Mr. Quinney by phone on Monday to follow up on his weight and leg/skin condition. At that time, we will schedule a face to face visit. Per Dr. Nelly Laurence request and orders, I will perform venipuncture for requested labs in 2 weeks at our scheduled home visit.    [place] [facility]  [phone]

## 2016-01-13 DIAGNOSIS — L89312 Pressure ulcer of right buttock, stage 2: Secondary | ICD-10-CM | POA: Diagnosis not present

## 2016-01-15 ENCOUNTER — Other Ambulatory Visit: Payer: Self-pay | Admitting: *Deleted

## 2016-01-15 ENCOUNTER — Other Ambulatory Visit: Payer: Medicare Other | Admitting: *Deleted

## 2016-01-15 DIAGNOSIS — E1140 Type 2 diabetes mellitus with diabetic neuropathy, unspecified: Secondary | ICD-10-CM | POA: Diagnosis not present

## 2016-01-15 DIAGNOSIS — E1151 Type 2 diabetes mellitus with diabetic peripheral angiopathy without gangrene: Secondary | ICD-10-CM | POA: Diagnosis not present

## 2016-01-15 NOTE — Patient Outreach (Signed)
[name]) Care Management  01/15/2016  Michael [name] [birth year] [id number]  I called [name] today to follow up on his legs (swelling, weeping). He says they are still swollen and weeping today. We scheduled a visit for in the morning. I will see him at 10:30am.    [place] [facility] Management  [phone]

## 2016-01-16 ENCOUNTER — Telehealth: Payer: Self-pay | Admitting: *Deleted

## 2016-01-16 ENCOUNTER — Telehealth: Payer: Self-pay | Admitting: Family Medicine

## 2016-01-16 ENCOUNTER — Other Ambulatory Visit: Payer: Self-pay | Admitting: *Deleted

## 2016-01-16 NOTE — Telephone Encounter (Signed)
Pt voiced understanding, will f/u on Friday

## 2016-01-16 NOTE — Patient Outreach (Signed)
[facility]) Care Management   01/16/2016  Michael [name] Sep 22, 1946 [id number]  Michael [name] is an 70 y.o. male with chronic diastolic heart failure with EF 60-65%, history of bradycardia, type II Diabetes, Hypertension, Iron Deficiency Anemia, NASH + Hepatomegaly, and degeneratove disc disease s/p surgery of L2-L3.   Michael [name] was admitted to the hospital in February with Acute/Chronic Diastolic Heart Failure and worsening lymphedema. Thereafter, he spent 21 days at [facility]. Michael [name] was referred to [place] [facility] for assistance with management of CHF.   [facility] Care Management has been following Michael [name] in the community since his discharge from SNF. Focus of care management services has been around CHF and DM Disease management with particular attention to volume management and peripheral edema/lymphedema and lower extremity skin ailments.   Objective:  Legs Images 01/12/16                                                            Legs Images 01/16/16                                                Subjective: "My legs are a whole lot worse. They're leaking like crazy."  Objective:   Review of Systems  Constitutional: Negative for fever, chills and malaise/fatigue.  Musculoskeletal: Negative for falls.  Skin: Positive for rash.  Neurological: Negative for weakness.    Physical Exam  Constitutional: He is oriented to person, place, and time.  Neurological: He is alert and oriented to person, place, and time.  Skin: Skin is warm. Rash noted. There is erythema.       Encounter Medications:   Outpatient Encounter Prescriptions as of 01/16/2016  Medication Sig Note  . ACCU-CHEK AVIVA PLUS test strip  04/20/2015: Received from: External Pharmacy  . aspirin EC 81 MG tablet Take 81 mg by mouth daily.   . B-D ULTRAFINE III SHORT PEN 31G X 8 MM MISC USE AS DIRECTED DAILY WITH LANTUS.   Marland Kitchen benazepril (LOTENSIN) 40 MG tablet TAKE ONE TABLET BY MOUTH DAILY.   .  bumetanide (BUMEX) 2 MG tablet Take 1.5 tablets (3 mg total) by mouth 2 (two) times daily.   Marland Kitchen BYSTOLIC 10 MG tablet TAKE ONE TABLET BY MOUTH DAILY.   . clobetasol cream (TEMOVATE) 0.05 % APPLY TO AFFECTED AREA 2 TIMES DAILY.   . diazepam (VALIUM) 5 MG tablet TAKE 1 TABLET BY MOUTH AT BEDTIME AS NEEDED FOR ANXIETY.   . diclofenac sodium (VOLTAREN) 1 % GEL Apply 4 g topically 4 (four) times daily.   . Ferrous Sulfate (IRON) 325 (65 FE) MG TABS Take 1 tablet by mouth daily.    Marland Kitchen gabapentin (NEURONTIN) 400 MG capsule TAKE ONE CAPSULE BY MOUTH THREE TIMES DAILY.   Marland Kitchen LANTUS SOLOSTAR 100 UNIT/ML Solostar Pen INJECT 30 UNITS SUBCUTANEOUSLY AT BEDTIME.   Marland Kitchen levothyroxine (SYNTHROID, LEVOTHROID) 88 MCG tablet Take 1 tablet (88 mcg total) by mouth daily. Take 1 tab PO Q AM 30 minutes prior to eating or taking any other medications.   . Magnesium 400 MG TABS Take 400 mg by mouth 2 (two) times daily.   . meloxicam (  MOBIC) 15 MG tablet Take 1 tablet (15 mg total) by mouth daily.   . metFORMIN (GLUCOPHAGE) 1000 MG tablet TAKE ONE TABLET BY MOUTH TWICE DAILY WITH A MEAL.   Marland Kitchen metolazone (ZAROXOLYN) 2.5 MG tablet Take 1 tablet on Monday, Wednesday and Friday.   . mupirocin cream (BACTROBAN) 2 % Apply 1 application topically 2 (two) times daily. To legs as needed   . mupirocin ointment (BACTROBAN) 2 % Apply 1 application topically 3 (three) times daily. APPLY TO LEGS 3 TIMES DAILY AS NEEDED   . nystatin cream (MYCOSTATIN) Apply 1 application topically 2 (two) times daily.   Marland Kitchen omeprazole (PRILOSEC) 20 MG capsule TAKE ONE CAPSULE BY MOUTH TWICE DAILY.   Marland Kitchen oxyCODONE-acetaminophen (PERCOCET) 7.5-325 MG tablet Take one tablet by mouth every 6 hours as needed for severe pain. Max APAP 3gm/24hrs from all sources   . potassium chloride (K-DUR) 10 MEQ tablet Take 2 tablets in the AM / Take 1 tablet in the PM (Patient taking differently: 20 mEq daily. Take 2 tablets in the AM / Take 1 tablet in the PM)   . pravastatin  (PRAVACHOL) 40 MG tablet TAKE ONE TABLET BY MOUTH DAILY.    Assessment:  70 year old gentleman living in [place] with ongoing volume management and skin problems. Michael [name] is attending all scheduled provider appointments and last saw Dr. Harl Bowie on 01/10/16.   Chronic Health Condition (CHF) with peripheral edema and weeping- Michael [name] weight on discharge from acute care setting was 195#. His weight quickly went up to as high as 231# after discharge. He is on an aggressive diuretic regimen which has helped but only gotten his weight down to 210-215# range. Today his weight is 211#. He denies shortness of breath, chest pain, worsened fatigue, or any symptoms other than peripheral edema and weeping.  Today, Michael [name] has worsened leg swelling and weeping with open vesicles on both lower extremities. Skin is bright pink.  I cleansed and dressed his legs and applied ACE wraps loosely, to keep dressing in place.   Plan:   I have notified Dr. Harl Bowie of today's assessment and will follow up with Michael [name], [name] with his further recommendations and orders.   I can see Michael [name] at home again any day this week if needed.   Michael [name] will continue all medications as prescribed.   Michael [name] will call for new or worsening symptoms.   Michael [name] will continue to check cbg's and weigh daily and record. If his weight increases 3# overnight or 5# in a week, he will call his provider or me if he cannot reach his provider.    [place] [facility]  [phone]

## 2016-01-16 NOTE — Telephone Encounter (Signed)
-----   Message from [name], MD sent at 01/16/2016 12:01 PM EDT ----- Regarding: Call Wound CLinic see if they see people for wounds associated with Lymphedema   If so, set pt up with wound clinic ASAP, he lives in [place]

## 2016-01-16 NOTE — Telephone Encounter (Signed)
-----   Message from [name], MD sent at 01/16/2016  7:52 AM EDT ----- Hardie Pulley, can we check on Mr Goodwins weight and swelling today. He should have taken metolazone Friday and Monday  [name] MD ----- Message -----    From: Clerance Lav, RN    Sent: 01/12/2016  12:14 PM      To: [name], MD  Hi Dr. Harl Bowie,   I'll be happy to get the labs on Mr. Donovan. I received a call from him today. He told me his legs were weeping and swollen. His weight is up from 211 to 213. He told me he took extra Metolazone. I advised him on cleaning/dressing his legs and told him I'd call him again on Monday. I'll schedule an appointment when I call him Monday.   Thank you,  [facility]  [phone]   ----- Message -----    From: [name], MD    Sent: 01/12/2016  11:34 AM      To: Clerance Lav, RN, Massie Maroon, CMA  Alisa, would you be willing to draw a BMET and Mg at home again on Mr Olazabal in 2 weeks for [place]. Staci, can you provide the appropriate order in epic   [name] MD

## 2016-01-16 NOTE — Telephone Encounter (Signed)
If weight trends down after todays dose I would continue metolazone on Mon, Wed, Fridays at 2.18m.  If weight does not trend down after todays dose then I would have him take the metolazone 2.58mWed and Thursday and call usKoreariday to update usKorea   J Zandra AbtsD

## 2016-01-16 NOTE — Telephone Encounter (Signed)
Pt says weight is 211lbs this AM. Took Metolazone Friday and this morning. Says legs are still swollen and weeping. Delrae Rend, RN was coming in as we were on the phone. Will forward

## 2016-01-17 ENCOUNTER — Other Ambulatory Visit: Payer: Self-pay | Admitting: *Deleted

## 2016-01-17 NOTE — Patient Outreach (Signed)
[place] [facility]) Care Management  01/17/2016  Michael [name] April 11, 1946 [id number]  Follow up with Michael [name] today re: weights and legs.   Weight today: 213#  Medications: Metolazone 2.1m M/W as prescribed Legs: per patient and niece "pouring fluid", dressings have been changed "at least 4 times" today; niece thinks legs looks better/less red but weeping more  Intervention: I followed up with Michael [name] afternoon to reinforce Dr. BNelly Laurenceinstructions to take additional Metolazone 2.5 tomorrow (Thursday) if weight not down.   Regarding Dr. DDorian Hecklerecommendation for wound care referral, [facility]is [facility] and has also [facility]. I explained that depending on the recommended care, he may have to go to an outpatient setting for care rather than receive it at home initially.   Plan: I will see Mr. GMolletttomorrow morning at 9am to assess legs, perform dressing change, provide supplies, follow up weight, assist with medication management. I will forward my note with images to provider team.    [facility]  [phone]

## 2016-01-18 ENCOUNTER — Other Ambulatory Visit: Payer: Self-pay | Admitting: Cardiology

## 2016-01-18 ENCOUNTER — Other Ambulatory Visit: Payer: Self-pay | Admitting: Family Medicine

## 2016-01-18 ENCOUNTER — Other Ambulatory Visit: Payer: Self-pay | Admitting: *Deleted

## 2016-01-18 NOTE — Patient Outreach (Addendum)
[facility]) Care Management   01/18/2016  ALEXANDRA POSADAS [birth year] [id number]  ESLI JERNIGAN is an 70 y.o. male with chronic diastolic heart failure with EF 60-65%, history of bradycardia, type II Diabetes, Hypertension, Iron Deficiency Anemia, NASH + Hepatomegaly, and degeneratove disc disease s/p surgery of L2-L3.   Mr. Labrosse was admitted to the hospital in February with Acute/Chronic Diastolic Heart Failure and worsening lymphedema. Thereafter, he spent 21 days at [facility]. Mr. Brindisi was referred to [facility] Management for assistance with management of CHF.   [facility] Care Management has been following Mr. Petrey in the community since his discharge from SNF. Focus of care management services has been around CHF and DM Disease management with particular attention to volume management and peripheral edema/lymphedema and lower extremity skin ailments.   Subjective: "I think my legs look and feel a whole lot better."  Objective:   Legs Images 01/12/16 Legs Images 01/16/16          Leg Images 01/18/16:                                                          Left Leg 01/18/16:                                                                    Review of Systems  Constitutional: Negative.   HENT: Negative.   Eyes: Negative.   Respiratory: Negative.   Cardiovascular: Positive for leg swelling.  Genitourinary: Negative.   Musculoskeletal: Negative for falls.  Skin: Positive for rash.  Neurological: Negative.   Psychiatric/Behavioral: Negative.     Physical Exam  Constitutional: He is oriented to person, place, and time. He appears well-developed and well-nourished. He is active.  Neurological: He is alert and oriented to person, place, and time.  Skin: Rash noted. There is erythema.     Psychiatric: He has a normal mood and affect. His behavior is normal. Judgment and  thought content normal.    Encounter Medications:   Outpatient Encounter Prescriptions as of 01/18/2016  Medication Sig Note  . ACCU-CHEK AVIVA PLUS test strip  04/20/2015: Received from: External Pharmacy  . aspirin EC 81 MG tablet Take 81 mg by mouth daily.   . B-D ULTRAFINE III SHORT PEN 31G X 8 MM MISC USE AS DIRECTED DAILY WITH LANTUS.   Marland Kitchen benazepril (LOTENSIN) 40 MG tablet TAKE ONE TABLET BY MOUTH DAILY.   . bumetanide (BUMEX) 2 MG tablet Take 1.5 tablets (3 mg total) by mouth 2 (two) times daily.   Marland Kitchen BYSTOLIC 10 MG tablet TAKE ONE TABLET BY MOUTH DAILY.   . clobetasol cream (TEMOVATE) 0.05 % APPLY TO AFFECTED AREA 2 TIMES DAILY.   . diazepam (VALIUM) 5 MG tablet TAKE 1 TABLET BY MOUTH AT BEDTIME AS NEEDED FOR ANXIETY.   . diclofenac sodium (VOLTAREN) 1 % GEL Apply 4 g topically 4 (four) times daily.   . Ferrous Sulfate (IRON) 325 (65 FE) MG TABS Take 1 tablet by mouth daily.    Marland Kitchen gabapentin (NEURONTIN) 400 MG capsule TAKE ONE  CAPSULE BY MOUTH THREE TIMES DAILY.   Marland Kitchen LANTUS SOLOSTAR 100 UNIT/ML Solostar Pen INJECT 30 UNITS SUBCUTANEOUSLY AT BEDTIME.   Marland Kitchen levothyroxine (SYNTHROID, LEVOTHROID) 88 MCG tablet Take 1 tablet (88 mcg total) by mouth daily. Take 1 tab PO Q AM 30 minutes prior to eating or taking any other medications.   . Magnesium 400 MG TABS Take 400 mg by mouth 2 (two) times daily.   . meloxicam (MOBIC) 15 MG tablet Take 1 tablet (15 mg total) by mouth daily.   . metFORMIN (GLUCOPHAGE) 1000 MG tablet TAKE ONE TABLET BY MOUTH TWICE DAILY WITH A MEAL.   Marland Kitchen metolazone (ZAROXOLYN) 2.5 MG tablet Take 1 tablet on Monday, Wednesday and Friday.   . mupirocin cream (BACTROBAN) 2 % Apply 1 application topically 2 (two) times daily. To legs as needed   . mupirocin ointment (BACTROBAN) 2 % Apply 1 application topically 3 (three) times daily. APPLY TO LEGS 3 TIMES DAILY AS NEEDED   . nystatin cream (MYCOSTATIN) Apply 1 application topically 2 (two) times daily.   Marland Kitchen omeprazole (PRILOSEC) 20  MG capsule TAKE ONE CAPSULE BY MOUTH TWICE DAILY.   Marland Kitchen oxyCODONE-acetaminophen (PERCOCET) 7.5-325 MG tablet Take one tablet by mouth every 6 hours as needed for severe pain. Max APAP 3gm/24hrs from all sources   . potassium chloride (K-DUR) 10 MEQ tablet Take 2 tablets in the AM / Take 1 tablet in the PM (Patient taking differently: 20 mEq daily. Take 2 tablets in the AM / Take 1 tablet in the PM)   . pravastatin (PRAVACHOL) 40 MG tablet TAKE ONE TABLET BY MOUTH DAILY.    Assessment: 70 year old gentleman living in [place] with ongoing volume management and skin problems. Mr. Hanover is attending all scheduled provider appointments and last saw Dr. Harl Bowie on 01/10/16.   Chronic Health Condition (CHF) with peripheral edema and weeping- Mr. Kelley weight on discharge from acute care setting was 195#. His weight quickly went up to as high as 231# after discharge. He is on an aggressive diuretic regimen which has helped but only gotten his weight down to 210-215# range. Today his weight is 209#. He denies shortness of breath, chest pain, worsened fatigue, or any symptoms.   Mr. Baston niece did an exceptional job of taking care of his leg wounds last evening. She cleansed the legs, applied A&D ointment and redressed with gauze and ACE wraps.  Today, Mr. Brannen has improvement in leg swelling and decreased weeping. Skin is pink but less bright. I cleansed and dressed his legs and applied ACE wraps and non skid socks.    Plan:   I have notified Dr. Harl Bowie and Dr. Buelah Manis of today's assessment and will follow up with Mr, Goodley with further recommendations and orders.   Mr. Zeek will continue all medications as prescribed.   Mr. Pavone niece will continue to help him with dressing changes each day.   Mr. Fraley will call for new or worsening symptoms.   Mr. Fennell will continue to check cbg's and weigh daily and record. If his weight increases 3# overnight or 5# in a week, he will  call his provider or me if he cannot reach his provider.   I will see Mr. Barney at home next week on Tuesday at 11:15am.    [facility] Management  [phone]

## 2016-01-22 ENCOUNTER — Telehealth (HOSPITAL_COMMUNITY): Payer: Self-pay

## 2016-01-22 ENCOUNTER — Other Ambulatory Visit: Payer: Self-pay | Admitting: *Deleted

## 2016-01-22 ENCOUNTER — Other Ambulatory Visit: Payer: Self-pay | Admitting: Family Medicine

## 2016-01-22 DIAGNOSIS — L03119 Cellulitis of unspecified part of limb: Secondary | ICD-10-CM

## 2016-01-22 NOTE — Telephone Encounter (Signed)
01/22/16 Called to schedule wound eval but patient is really wanting home health.  He said that it is hard for him to get out in his wheelchair.  I told him that I would put in a message to Dr. Buelah Manis requesting home health for him.

## 2016-01-23 ENCOUNTER — Other Ambulatory Visit: Payer: Self-pay | Admitting: Family Medicine

## 2016-01-23 ENCOUNTER — Telehealth: Payer: Self-pay | Admitting: *Deleted

## 2016-01-23 ENCOUNTER — Other Ambulatory Visit: Payer: Medicare Other | Admitting: *Deleted

## 2016-01-23 ENCOUNTER — Other Ambulatory Visit: Payer: Self-pay | Admitting: *Deleted

## 2016-01-23 ENCOUNTER — Telehealth: Payer: Self-pay | Admitting: Family Medicine

## 2016-01-23 ENCOUNTER — Ambulatory Visit: Payer: Self-pay | Admitting: *Deleted

## 2016-01-23 DIAGNOSIS — I10 Essential (primary) hypertension: Secondary | ICD-10-CM

## 2016-01-23 MED ORDER — METOLAZONE 2.5 MG PO TABS: 2.5000 mg | ORAL_TABLET | Freq: Every day | ORAL | Status: DC

## 2016-01-23 NOTE — Telephone Encounter (Signed)
Weights not decreasing any further, Staci lets have him start taking metolazone 2.71m daily.  JZandra AbtsMD

## 2016-01-23 NOTE — Patient Outreach (Addendum)
[facility]) Care Management   01/22/2016  Michael [name] Nov 08, 1945 [id number]  Michael [name] is an 70 y.o. male with chronic diastolic heart failure with EF 60-65%, history of bradycardia, type II Diabetes, Hypertension, Iron Deficiency Anemia, NASH + Hepatomegaly, and degeneratove disc disease s/p surgery of L2-L3.   Michael [name] was admitted to the hospital in February with Acute/Chronic Diastolic Heart Failure and worsening lymphedema. Thereafter, he spent 21 days at [facility]. Michael [name] was referred to [place] for assistance with management of CHF.   [facility] Care Management has been following Michael [name] in the community since his discharge from SNF. Focus of care management services has been around CHF and DM Disease management with particular attention to volume management and peripheral edema/lymphedema and lower extremity skin ailments.   Subjective: "I just  Don't know about these legs"  Objective:  70 year old gentleman with worsening skin condition despite twice daily dressing changes.   Leg Images 01/22/16:               Review of Systems  Constitutional: Positive for malaise/fatigue. Negative for fever and chills.  HENT: Negative.   Eyes: Negative.   Respiratory: Negative for cough, sputum production, shortness of breath and wheezing.   Cardiovascular: Positive for leg swelling. Negative for chest pain and palpitations.  Gastrointestinal: Negative.   Genitourinary: Negative.   Musculoskeletal: Negative for falls.  Skin: Positive for rash.  Neurological: Negative.   Psychiatric/Behavioral: Negative.     Physical Exam  Constitutional: He is oriented to person, place, and time. He appears well-developed and well-nourished. He is active.  Cardiovascular: Normal rate and regular rhythm.   Respiratory: Effort normal and breath sounds normal.  GI: Bowel sounds are normal.  Neurological: He is alert and oriented to person, place, and time.  Skin:  Rash noted. There is erythema.     Psychiatric: He has a normal mood and affect. His speech is normal and behavior is normal. Judgment and thought content normal. Cognition and memory are normal.    Encounter Medications:   Outpatient Encounter Prescriptions as of 01/22/2016  Medication Sig  . aspirin EC 81 MG tablet Take 81 mg by mouth daily.  . B-D ULTRAFINE III SHORT PEN 31G X 8 MM MISC USE AS DIRECTED DAILY WITH LANTUS.  Marland Kitchen benazepril (LOTENSIN) 40 MG tablet TAKE ONE TABLET BY MOUTH DAILY.  . bumetanide (BUMEX) 2 MG tablet Take 1.5 tablets (3 mg total) by mouth 2 (two) times daily.  Marland Kitchen BYSTOLIC 10 MG tablet TAKE ONE TABLET BY MOUTH DAILY.  . diazepam (VALIUM) 5 MG tablet TAKE 1 TABLET BY MOUTH AT BEDTIME AS NEEDED FOR ANXIETY.  . diclofenac sodium (VOLTAREN) 1 % GEL Apply 4 g topically 4 (four) times daily.  . Ferrous Sulfate (IRON) 325 (65 FE) MG TABS Take 1 tablet by mouth daily.   Marland Kitchen gabapentin (NEURONTIN) 400 MG capsule TAKE ONE CAPSULE BY MOUTH THREE TIMES DAILY.  Marland Kitchen LANTUS SOLOSTAR 100 UNIT/ML Solostar Pen INJECT 30 UNITS SUBCUTANEOUSLY AT BEDTIME.  Marland Kitchen levothyroxine (SYNTHROID, LEVOTHROID) 88 MCG tablet Take 1 tablet (88 mcg total) by mouth daily. Take 1 tab PO Q AM 30 minutes prior to eating or taking any other medications.  . Magnesium 400 MG TABS Take 400 mg by mouth 2 (two) times daily.  . meloxicam (MOBIC) 15 MG tablet Take 1 tablet (15 mg total) by mouth daily.  . metFORMIN (GLUCOPHAGE) 1000 MG tablet TAKE ONE TABLET BY MOUTH TWICE DAILY  WITH A MEAL.  Marland Kitchen metolazone (ZAROXOLYN) 2.5 MG tablet Take 1 tablet on Monday, Wednesday and Friday.  . mupirocin ointment (BACTROBAN) 2 % APPLY 1 APPLICATION TO THE LEGS 3 TIMES A DAY  . omeprazole (PRILOSEC) 20 MG capsule TAKE ONE CAPSULE BY MOUTH TWICE DAILY.  Marland Kitchen oxyCODONE-acetaminophen (PERCOCET) 7.5-325 MG tablet Take one tablet by mouth every 6 hours as needed for severe pain. Max APAP 3gm/24hrs from all sources  . potassium chloride (K-DUR)  10 MEQ tablet Take 2 tablets in the AM / Take 1 tablet in the PM (Patient taking differently: 20 mEq daily. Take 2 tablets in the AM / Take 1 tablet in the PM)  . pravastatin (PRAVACHOL) 40 MG tablet TAKE ONE TABLET BY MOUTH DAILY.   Assessment:  70 year old gentleman living in [place] with ongoing volume management and skin problems. Michael [name] is attending all scheduled provider appointments and last saw Dr. Harl Bowie on 01/10/16.   Chronic Health Condition (CHF) with peripheral edema and weeping- Michael [name] weight on discharge from acute care setting was 195#. His weight quickly went up to as high as 231# after discharge. He is on an aggressive diuretic regimen which has helped but only gotten his weight down to 210-215# range. Today his weight is 209#. He denies shortness of breath, chest pain, worsened fatigue, or any symptoms.   Acute Health Condition (BLE leg rash with wounds) - Michael [name] has circumferential redness and blistering of bilateral lower legs with interruption of skin integrity. He has been being treated at home with topical ointment and dressing changes. He still has significant weeping from both legs today and the skin is warm to touch.   Michael [name] says he was contacted by the [place] but didn't think he would be able to afford the $40 copays/ visit.   Plan:   I have notified Dr. Harl Bowie and Dr. Buelah Manis of today's assessment and will follow up with Mr, [name] with further recommendations and orders.   Michael [name] will continue all medications as prescribed.   Michael [name] niece will continue to help him with dressing changes each day.   Michael [name] will call for new or worsening symptoms.   Michael [name] will continue to check cbg's and weigh daily and record. If his weight increases 3# overnight or 5# in a week, he will call his provider or me if he cannot reach his provider.    [facility]  [phone]

## 2016-01-23 NOTE — Telephone Encounter (Signed)
Dr. Harl Bowie and Hardie Pulley - full assessment note forthcoming this afternoon. Mr. Triggs weight yesterday was 209#. He is taking Metolazone M/W/F as ordered, no extra doses. His legs are not improving with treatment at home. I exchanged electronic communication with Dr. Buelah Manis today who recommends wound care center referral which she is going to make. I am going to speak with the [facility] re: payment options as Mr. Brandow is concerned with ability to pay $40 copay each trip.

## 2016-01-23 NOTE — Telephone Encounter (Signed)
error 

## 2016-01-23 NOTE — Telephone Encounter (Signed)
I have been out of office since 01/19/16 - Pt says weight 211 lbs today. Says after unwrapped legs they were not bleeding today as they were yesterday and has blisters on both feet. Says nurse did come by today and would be back in touch regarding appts to have leg wounds checked. Pt says he was supposed to be set up with wound care per [place]. Will forward to Dr. Harl Bowie and Delrae Rend

## 2016-01-23 NOTE — Patient Outreach (Signed)
[facility]) Care Management  01/23/2016  AMOS MICHEALS 1946/03/14 [id number]  Follow Up with Mr. Gergen by phone today.   CHF Disease Management - Dr. Harl Bowie has advised Mr. Shavers to take Metolazone 2.67m po qd (increase in dose from M/W/F only); Mr. GPudlospoke with Dr. BNelly LaurenceCNA and verbalizes understanding  Plan: Mr. GVanriperwill take Metolazone daily and will weigh daily and record, calling for weight gain of 3# overnight or 5# in a week.   Skin Condition - Dr. DBuelah Manishas referred Mr. GKatichto the [place] I reached out to the [facility] follow up on referral and provide needed information for care coordination. Mr. GLedermanis aware that Dr. DBuelah Maniswishes for him to go to the clinic at their first available appointment.   Plan: Mr. [place]agerwill schedule appointment with the [place]   I will follow Mr. GLawhornclosely by phone this week and visit if needed. I have scheduled a follow up face to face visit for next week.    [facility]  [phone]

## 2016-01-23 NOTE — Telephone Encounter (Signed)
Pt made aware, med list updated.

## 2016-01-23 NOTE — Telephone Encounter (Signed)
-----   Message from [name], MD sent at 01/19/2016  1:35 PM EDT ----- Regarding: RE: THN note Arletha Grippe, I believe Mr Overfield was to contact us to day to update [place] on his status. Can you f/u   J Branch ----- Message -----    From: Bernita Raisin, RN    Sent: 01/19/2016   9:33 AM      To: [name], MD Subject: [facility] note                                       See 01/18/16 THN note and advise  thanks

## 2016-01-23 NOTE — Telephone Encounter (Signed)
Refill appropriate and filled per protocol. 

## 2016-01-25 ENCOUNTER — Ambulatory Visit (HOSPITAL_COMMUNITY): Payer: Medicare Other | Attending: Family Medicine | Admitting: Physical Therapy

## 2016-01-25 ENCOUNTER — Ambulatory Visit (HOSPITAL_COMMUNITY): Payer: Medicare Other | Admitting: Physical Therapy

## 2016-01-25 DIAGNOSIS — I890 Lymphedema, not elsewhere classified: Secondary | ICD-10-CM | POA: Diagnosis not present

## 2016-01-25 DIAGNOSIS — R2681 Unsteadiness on feet: Secondary | ICD-10-CM | POA: Insufficient documentation

## 2016-01-25 DIAGNOSIS — R262 Difficulty in walking, not elsewhere classified: Secondary | ICD-10-CM | POA: Insufficient documentation

## 2016-01-25 NOTE — Therapy (Signed)
[place], [place], [address] Phone: [phone]   Fax:  [phone]  Physical Therapy Evaluation  Patient Details  Name: Michael [name] MRN: [id number] Date of Birth: [birth year] Referring Provider: Dr. Vic Blackbird  Encounter Date: 01/25/2016      PT End of Session - 01/25/16 1228    Visit Number 1   Number of Visits 18   Date for PT Re-Evaluation 02/24/16   Authorization Type [employer] medicare    Authorization - Visit Number 1   Authorization - Number of Visits 18   PT Start Time 0915   PT Stop Time 1035   PT Time Calculation (min) 80 min   Activity Tolerance Patient tolerated treatment well   Behavior During Therapy [facility] for tasks assessed/performed      Past Medical History  Diagnosis Date  . COPD (chronic obstructive pulmonary disease) ([place])   . Type 2 diabetes mellitus ([place])   . Hyperlipidemia   . Essential hypertension   . Iron deficiency anemia   . NASH (nonalcoholic steatohepatitis)   . Diabetic neuropathy ([place])   . GERD (gastroesophageal reflux disease)   . Arthritis   . Depression   . Diastolic dysfunction   . Left knee DJD   . Lumbar spinal stenosis   . DDD (degenerative disc disease), lumbosacral   . Peripheral edema     Past Surgical History  Procedure Laterality Date  . Hernia repair    . Shoulder surgery      Rght-rotator cuff  . Lipoma removal      Stomach  . Knee arthroscopy with medial menisectomy Left 11/06/2012    Procedure: KNEE ARTHROSCOPY WITH MEDIAL MENISECTOMY;  Surgeon: Carole Civil, MD;  Location: [facility];  Service: Orthopedics;  Laterality: Left;  . Lumbar laminectomy/decompression microdiscectomy Left 08/30/2013    Procedure: LUMBAR LAMINECTOMY/DECOMPRESSION MICRODISCECTOMY LEFT  LUMBAR TWO THREE;  Surgeon: Otilio Connors, MD;  Location: [facility];  Service: Neurosurgery;  Laterality: Left;  . Egd with enteroscopy  2011    [facility]: normal esophagus and  stomach. Normal duodenum, jejunum. No evidence of AVMs.   . Colonoscopy  2011    [facility]: normal colon, normal distal ileum  . Colonoscopy with propofol N/A 07/21/2014    Procedure: ATTEMPTED COLONOSCOPY WITH PROPOFOL-HAD TO STOP DUE TO BRADYCARDIA;  Surgeon: Daneil Dolin, MD;  Location: [facility];  Service: Endoscopy;  Laterality: N/A;  . Esophagogastroduodenoscopy (egd) with propofol N/A 07/21/2014    Procedure: ESOPHAGOGASTRODUODENOSCOPY (EGD) WITH PROPOFOL;  Surgeon: Daneil Dolin, MD;  Location: [facility];  Service: Endoscopy;  Laterality: N/A;  . Biopsy N/A 07/21/2014    Procedure: BIOPSY;  Surgeon: Daneil Dolin, MD;  Location: [facility];  Service: Endoscopy;  Laterality: N/A;    There were no vitals filed for this visit.       Subjective Assessment - 01/25/16 0929    Subjective Mr. Hudnall states that his legs began to swell well over a year ago.  He was hospitalized from 10/05/2015 to 1/9/2017and then went to SNF until 10/28/2015.  He has been seen for his wounds by Home health from 10/28/2015 until  01/03/2016.  At tihis time his legs have increased in size and has copious weeping .   He is being referred for skilled physical therapy to attempt to improve his swelling and stop the weeping.     Pertinent History CHF, DM, HTN,    How long can you  sit comfortably? no problem    How long can you stand comfortably? unable    How long can you walk comfortably? unable    Currently in Pain? Yes   Pain Score 4    Pain Location Leg   Pain Orientation Right;Left   Pain Descriptors / Indicators Tightness;Heaviness   Pain Type Chronic pain   Pain Onset More than a month ago   Pain Frequency Constant   Aggravating Factors  legs down   Pain Relieving Factors elevation    Effect of Pain on Daily Activities increases             [facility] PT Assessment - 01/25/16 0001    Assessment   Medical Diagnosis Bilateral lymphedema   Referring Provider Dr. Vic Blackbird   Onset Date/Surgical Date  01/05/16   Next MD Visit 02/07/2016   Prior Therapy home health   Precautions   Precautions Fall   Restrictions   Weight Bearing Restrictions No   Balance Screen   Has the patient fallen in the past 6 months No   Has the patient had a decrease in activity level because of a fear of falling?  Yes   Is the patient reluctant to leave their home because of a fear of falling?  Yes   [place] residence   [place] entrance   Prior Function   Level of Independence Needs assistance with homemaking;Needs assistance with transfers  has an aide 15 hours a week.    Cognition   Overall Cognitive Status Within Functional Limits for tasks assessed   Observation/Other Assessments   Other Surveys  --  Lymphedema impact score 84 with vocation omitted            LYMPHEDEMA/ONCOLOGY QUESTIONNAIRE - 01/25/16 0942    Date Lymphedema/Swelling Started   Date (p) 12/19/14   What other symptoms do you have   Are you Having Heaviness or Tightness (p) Yes   Are you having Pain (p) Yes   Are you having pitting edema (p) Yes   Body Site (p) legs bilaterally   Is it Hard or Difficult finding clothes that fit (p) Yes   Do you have infections (p) Yes   Comments (p) cellulitis B LE    Stemmer Sign (p) Yes   Lymphedema Stage   Stage (p) STAGE 2 SPONTANEOUSLY IRREVERSIBLE   Lymphedema Assessments   Lymphedema Assessments (p) Lower extremities   Right Lower Extremity Lymphedema   At Midpatella/Popliteal Crease (p) 44.3 cm   30 cm Proximal to Floor at Lateral Plantar Foot (p) 46 cm   20 cm Proximal to Floor at Lateral Plantar Foot (p) 42.5 1   10  cm Proximal to Floor at Lateral Malleoli (p) 37.3 cm   Circumference of ankle/heel (p) 36 cm.   5 cm Proximal to 1st MTP Joint (p) 31.4 cm   Across MTP Joint (p) 31.5 cm   Around Proximal Great Toe (p) 12.5 cm   Left Lower Extremity Lymphedema   At Midpatella/Popliteal Crease (p) 41 cm   30 cm Proximal to Floor  at Lateral Plantar Foot (p) 46.5 cm   20 cm Proximal to Floor at Lateral Plantar Foot (p) 41.5 cm   10 cm Proximal to Floor at Lateral Malleoli (p) 35 cm   Circumference of ankle/heel (p) 39.2 cm.   5 cm Proximal to 1st MTP Joint (p) 31.3 cm   Across MTP Joint (p) 32.8 cm   Around Proximal  Great Toe (p) 12 cm                [facility] Adult PT Treatment/Exercise - 01/25/16 0001    Self-Care   Self-Care Other Self-Care Comments   Other Self-Care Comments  HEP   Exercises   Exercises Knee/Hip   Knee/Hip Exercises: Seated   Long Arc Quad Strengthening;Both;10 reps   Ball Squeeze x10   Other Seated Knee/Hip Exercises ankle pumps x 10    Marching Both;10 reps   Manual Therapy   Manual Therapy Manual Lymphatic Drainage (MLD);Compression Bandaging   Manual therapy comments Time for toes and forefoot only    Compression Bandaging due to drainage used alginate dressing followed by toe wrapping of the big and second toe bilaterally with bilateral LE wrapped using profore compression bandaging with extra cotton bilaterally to produce cone shape for equalized compression.                 PT Education - 01/25/16 1225    Education provided Yes   Education Details Pt given HEP for promotion of lymphatic circulation as well as instruction that compression dressing should not be uncomfortable and to take of if pt has any pain.    Person(s) Educated Patient   Methods Explanation   Comprehension Verbalized understanding;Returned demonstration          PT Short Term Goals - 01/25/16 1239    PT SHORT TERM GOAL #1   Title Pt will have a  5 cm reduction in bilateral  for improved fitting of clothing    Time 3   Period Weeks   Status New   PT SHORT TERM GOAL #2   Title Pt will be able to identify the signs and symptoms of cellulitis due to prolong lymphstasis   Time 1   Period Weeks   Status New   PT SHORT TERM GOAL #3   Title Pt will have a 5 cm decrease at dorsum of foot to  improve ability to don shoes    Time 3   Period Weeks   Status New   PT SHORT TERM GOAL #4   Title Pt will be I in remedial exercises to promote lymph circulaiton    Time 2   Period Weeks   Status New           PT Long Term Goals - 01/25/16 1240    PT LONG TERM GOAL #1   Title Pt will have a 7cm  reduction in fluid in bilateral LE  to promote independence of transfers. Pt to be able to transfer to bed and be able to lift his feet onto the bed on his own.    Time 6   Period Weeks   Status New   PT LONG TERM GOAL #2   Title Pt to have acquired compression garment for bilateral  LE to allow maintance phase of treatment to begin    Time 6   Period Weeks   Status New   PT LONG TERM GOAL #3   Title Pt to have no drainage from either LE to reduce risk of infection .   Time 3   Period Weeks   PT LONG TERM GOAL #4   Title Pt life impact to decrease by at least 15 points to show improved physical/functional concerns    Time 6   Period Weeks   Status New               Plan -  01/25/16 1229    Clinical Impression Statement Mr. Minix is a 70 yo male who has been diagnosed with bilateral LE lymphedema.  He has been nonambutlatory due to his leg swelling for over six months.  He has been  referred to this clinic before but did not come to treatment due to financial obligation.  His legs have increased in size to the point where they are both sloughing skin and weeping copius serous fluid.  Examination demonstrates B edema with induration, papillomas and copious weeping .  He is now being referred to skilled physical therapy for wound and lymphedema management.     Rehab Potential Good   PT Frequency 3x / week   PT Duration 6 weeks   PT Treatment/Interventions ADLs/Self Care Home Management;Compression bandaging;Manual techniques;Manual lymph drainage;Therapeutic exercise;Balance training;Therapeutic activities   PT Next Visit Plan Cut foam for bilateral LE next session. Begin  manual lymph drainage followed by short stretch compression dressing once drainage has stopped.  Pt will bring short stretch bandages to next treatment session    Consulted and Agree with Plan of Care Patient      Patient will benefit from skilled therapeutic intervention in order to improve the following deficits and impairments:  Pain, Increased edema, Decreased activity tolerance, Decreased mobility, Decreased range of motion  Visit Diagnosis: Lymphedema, not elsewhere classified - Plan: PT plan of care cert/re-cert  Unsteadiness on feet - Plan: PT plan of care cert/re-cert  Difficulty in walking, not elsewhere classified - Plan: PT plan of care cert/re-cert      G-Codes - 19/14/78 1249    Functional Assessment Tool Used life impact score    Functional Limitation Other PT primary   Other PT Primary Current Status (G9562) At least 80 percent but less than 100 percent impaired, limited or restricted   Other PT Primary Goal Status (Z3086) At least 40 percent but less than 60 percent impaired, limited or restricted       Problem List Patient Active Problem List   Diagnosis Date Noted  . Peripheral edema 10/05/2015  . Diastolic CHF ([place]) 57/84/6962  . Diastolic dysfunction 95/28/4132  . Stage II pressure ulcer of sacral region 02/13/2015  . Atypical nevi 01/18/2015  . Intertriginous candidiasis 12/23/2014  . Hypothyroidism 11/30/2014  . Bradycardia 07/21/2014  . Sinus pause 07/21/2014  . Liver fibrosis ([place]) 06/29/2014  . IDA (iron deficiency anemia) 06/29/2014  . Iron deficiency anemia 05/31/2014  . Hepatomegaly 05/25/2014  . Difficulty walking 05/03/2014  . Knee pain, chronic 05/03/2014  . Stiffness of joint, not elsewhere classified, pelvic region and thigh 05/03/2014  . Dermatitis 04/12/2014  . HNP (herniated nucleus pulposus), lumbar 08/30/2013  . Spinal stenosis of lumbar region 07/06/2013  . Carpal tunnel syndrome 04/13/2013  . Lymphedema 04/13/2013  . Status  post arthroscopy of left knee 04/13/2013  . DDD (degenerative disc disease), lumbosacral 04/13/2013  . Peripheral neuropathy ([place]) 01/05/2013  . S/P arthroscopy of left knee 11/09/2012  . Osteoarthritis of left knee 10/27/2012  . Meniscus tear 10/07/2012  . Patellar tendonitis 07/01/2012  . NASH (nonalcoholic steatohepatitis) 05/22/2007  . Type II diabetes mellitus with neurological manifestations ([place]) 05/20/2007  . Hyperlipidemia 10/15/2006  . OBESITY, MORBID 10/15/2006  . Essential hypertension 10/15/2006  . GERD 10/15/2006   Rayetta Humphrey, PT CLT [phone] 01/25/2016, 12:55 PM  [facility] [address] [place], [place], [address] Phone: [phone]   Fax:  [phone]  Name: AZAVION BOUILLON MRN: [id number] Date of  Birth: 20-Sep-1946

## 2016-01-26 ENCOUNTER — Other Ambulatory Visit: Payer: Self-pay | Admitting: *Deleted

## 2016-01-26 NOTE — Patient Outreach (Addendum)
[facility]) Care Management  01/25/2016  Michael [name] May 30, 1946 [id number]  I reached out to [name] by phone today to follow up regarding his referral to outpatient PT for treatment of lymphedema. He did not answer and I see from medical record review that [name] attended his appointment at PT and began treatment. It appears he will be going to outpatient PT for treatment three times weekly..   Plan: I will follow up with [name] next week so that we can continue to closely follow weights, medication management, and diabetes management.    [place] Management  [phone]

## 2016-01-27 ENCOUNTER — Other Ambulatory Visit: Payer: Self-pay | Admitting: Family Medicine

## 2016-01-29 ENCOUNTER — Telehealth (HOSPITAL_COMMUNITY): Payer: Self-pay | Admitting: Physical Therapy

## 2016-01-29 ENCOUNTER — Other Ambulatory Visit: Payer: Self-pay | Admitting: *Deleted

## 2016-01-29 NOTE — Telephone Encounter (Signed)
Medication called to pharmacy. 

## 2016-01-29 NOTE — Patient Outreach (Signed)
[facility]) Care Management  01/29/2016  Michael [name] [birth year] [id number]  Call received from Michael [name] today saying he didn't know what to do about his legs. He related having gone to his PT appointment on Thursday for treatment for lymphedema and had his legs wrapped. He said by Friday morning he was in quite a bit of discomfort and reached out to the PT department who were trying on Friday afternoon to get him scheduled for a return visit. Michael [name] said he had his niece take his dressings off and when she did he had relief of discomfort but didn't know what to do about redressing his legs. His niece redressed his legs with the materials she had available but he feels certain this is not optimal. He placed a call to his therapist this morning and awaits her return call and recommendations.   Plan: I will follow up with Michael [name] over the next few hours and will assist with any needs as recommended by his therapist.  Of note, Michael [name] says he has a "pressure sore" on his sacrum according to his niece. I will notify his therapist and ask for her recommendation regarding this area as well as Michael [name] is scheduled for an appointment on Wednesday.    [facility]  [phone]

## 2016-01-29 NOTE — Telephone Encounter (Signed)
Returned phone call per request from [place] Control and instrumentation engineer).  Pt called in upset due to trying to contact therapy last Friday without a return phone call.  States he left appt without a return appt. And was told by his therapist he would be contacted for more appointments.  No one contacted him or called him back as his LE's were hurting from the bandaging.  States he had to get his niece to remove the bandages as his legs were so painful Thursday night, he was unable to sleep.  Able to discuss with patient and inform of next appointment.  Pt instructed to bring is compression bandages next session. Teena Irani, PTA/CLT [phone]

## 2016-01-29 NOTE — Telephone Encounter (Signed)
Ok to refill??  Last office visit 12/19/2015.  Last refill 12/25/2015.

## 2016-01-29 NOTE — Telephone Encounter (Signed)
okay

## 2016-01-30 ENCOUNTER — Other Ambulatory Visit: Payer: Self-pay | Admitting: Family Medicine

## 2016-01-30 ENCOUNTER — Encounter: Payer: Self-pay | Admitting: Family Medicine

## 2016-01-30 ENCOUNTER — Ambulatory Visit (INDEPENDENT_AMBULATORY_CARE_PROVIDER_SITE_OTHER): Payer: Medicare Other | Admitting: Family Medicine

## 2016-01-30 VITALS — BP 136/82 | HR 88 | Temp 98.4°F | Resp 16 | Wt 201.0 lb

## 2016-01-30 DIAGNOSIS — L89152 Pressure ulcer of sacral region, stage 2: Secondary | ICD-10-CM | POA: Diagnosis not present

## 2016-01-30 DIAGNOSIS — I890 Lymphedema, not elsewhere classified: Secondary | ICD-10-CM | POA: Diagnosis not present

## 2016-01-30 MED ORDER — OXYCODONE-ACETAMINOPHEN 7.5-325 MG PO TABS: ORAL_TABLET | ORAL | Status: DC

## 2016-01-30 NOTE — Telephone Encounter (Signed)
Refill appropriate and filled per protocol. 

## 2016-01-30 NOTE — Patient Instructions (Signed)
Continue with wound care Use the DuoDerm  Use Desitin ointment/ use donut to relief pressure  Pain medication refilled  F/U as previous

## 2016-01-31 ENCOUNTER — Ambulatory Visit (HOSPITAL_COMMUNITY): Payer: Medicare Other | Attending: Family Medicine | Admitting: Physical Therapy

## 2016-01-31 ENCOUNTER — Encounter: Payer: Self-pay | Admitting: Family Medicine

## 2016-01-31 DIAGNOSIS — M25562 Pain in left knee: Secondary | ICD-10-CM | POA: Diagnosis not present

## 2016-01-31 DIAGNOSIS — R262 Difficulty in walking, not elsewhere classified: Secondary | ICD-10-CM | POA: Diagnosis not present

## 2016-01-31 DIAGNOSIS — M25561 Pain in right knee: Secondary | ICD-10-CM | POA: Insufficient documentation

## 2016-01-31 DIAGNOSIS — R2681 Unsteadiness on feet: Secondary | ICD-10-CM | POA: Diagnosis not present

## 2016-01-31 DIAGNOSIS — I890 Lymphedema, not elsewhere classified: Secondary | ICD-10-CM | POA: Diagnosis not present

## 2016-01-31 DIAGNOSIS — M6281 Muscle weakness (generalized): Secondary | ICD-10-CM | POA: Diagnosis not present

## 2016-01-31 NOTE — Progress Notes (Signed)
Patient ID: Michael [name], male   DOB: Feb 20, 1946, 70 y.o.   MRN: [id number]   Subjective:    Patient ID: Michael [name], male    DOB: 01-20-1946, 70 y.o.   MRN: [id number]  Patient presents for F/U Edema and Sacral Wound Patient here to review wounds. He now has irritation in his sacral region. I received a note from his [employer] administrator. He stated he noticed some irritation a week or so ago he has been trying to use a doughnut to take pressure off. He is pretty much sitting all day long because of his lymphedema which she's had weeping and some blistering on his legs. He is now in wound care for his legs. He also had a few days of diarrhea when he was wiping a lot. He has not had any fever.  He does some that his [employer] benefits her chronic again which will help pay his co-pays for his wound care and lymphedema treatment  Review Of Systems:  GEN- denies fatigue, fever, weight loss,weakness, recent illness HEENT- denies eye drainage, change in vision, nasal discharge, CVS- denies chest pain, palpitations RESP- denies SOB, cough, wheeze ABD- denies N/V, change in stools, abd pain Neuro- denies headache, dizziness, syncope, seizure activity       Objective:    BP 136/82 mmHg  Pulse 88  Temp(Src) 98.4 F (36.9 C) (Oral)  Resp 16  Wt 201 lb (91.173 kg) GEN- NAD, alert and oriented x3thyromegaly CVS- RRR, no murmur RESP-CTAB ABD-NABS,soft,NT,ND EXT- chronic non pitting lypmhedema to shins bilat,,scabbed areas with minimal weeping, 2 blisters on right leg with cleaning blistered skin sloughed off, mild TTP, no odor to legs Sacrum/gluteal cleft with erythema extending to scrotum, early stage II at top of cleft, TTP         Assessment & Plan:      Problem List Items Addressed This Visit    Stage II pressure ulcer of sacral region - Primary    Recurrence of the pressure ulcer as he is sitting all day long. It is very difficult to get him anymore active  with his lower extremity issues and his chronic back pain and knee pain. We have cleaned the area we have placed Duoderm at the area of concern. Advised him to use wipes with his bowel movements for now hope leaking keep things from getting superinfected. He also has a barrier cream that he uses.  Continue Duoderm which worked well in past      Lymphedema    The area where his blistered skin sloughed off we have placed nonadhesive bandage with an antibiotic cream. He will follow up tomorrow for his wound care with a do his wrappings. I will let them continue with the current treatment plan         Note: This dictation was prepared with Dragon dictation along with smaller phrase technology. Any transcriptional errors that result from this process are unintentional.

## 2016-01-31 NOTE — Assessment & Plan Note (Signed)
The area where his blistered skin sloughed off we have placed nonadhesive bandage with an antibiotic cream. He will follow up tomorrow for his wound care with a do his wrappings. I will let them continue with the current treatment plan

## 2016-01-31 NOTE — Assessment & Plan Note (Signed)
Recurrence of the pressure ulcer as he is sitting all day long. It is very difficult to get him anymore active with his lower extremity issues and his chronic back pain and knee pain. We have cleaned the area we have placed Duoderm at the area of concern. Advised him to use wipes with his bowel movements for now hope leaking keep things from getting superinfected. He also has a barrier cream that he uses.  Continue Duoderm which worked well in past

## 2016-01-31 NOTE — Therapy (Signed)
[facility] [place], [place], [address] Phone: [phone]   Fax:  [phone]  Physical Therapy Treatment  Patient Details  Name: Michael [name] MRN: [id number] Date of Birth: [birth year] Referring Provider: Dr. Vic Blackbird  Encounter Date: 01/31/2016      PT End of Session - 01/31/16 1201    Visit Number 2   Number of Visits 18   Date for PT Re-Evaluation 02/24/16   Authorization Type [place] - Visit Number 2   Authorization - Number of Visits 18   PT Start Time 0945   PT Stop Time 1100   PT Time Calculation (min) 75 min   Activity Tolerance Patient tolerated treatment well   Behavior During Therapy [facility] for tasks assessed/performed      Past Medical History  Diagnosis Date  . COPD (chronic obstructive pulmonary disease) ([place])   . Type 2 diabetes mellitus ([place])   . Hyperlipidemia   . Essential hypertension   . Iron deficiency anemia   . NASH (nonalcoholic steatohepatitis)   . Diabetic neuropathy ([place])   . GERD (gastroesophageal reflux disease)   . Arthritis   . Depression   . Diastolic dysfunction   . Left knee DJD   . Lumbar spinal stenosis   . DDD (degenerative disc disease), lumbosacral   . Peripheral edema     Past Surgical History  Procedure Laterality Date  . Hernia repair    . Shoulder surgery      Rght-rotator cuff  . Lipoma removal      Stomach  . Knee arthroscopy with medial menisectomy Left 11/06/2012    Procedure: KNEE ARTHROSCOPY WITH MEDIAL MENISECTOMY;  Surgeon: Carole Civil, MD;  Location: [facility];  Service: Orthopedics;  Laterality: Left;  . Lumbar laminectomy/decompression microdiscectomy Left 08/30/2013    Procedure: LUMBAR LAMINECTOMY/DECOMPRESSION MICRODISCECTOMY LEFT  LUMBAR TWO THREE;  Surgeon: Otilio Connors, MD;  Location: [facility];  Service: Neurosurgery;  Laterality: Left;  . Egd with enteroscopy  2011    [facility]: normal esophagus and stomach.  Normal duodenum, jejunum. No evidence of AVMs.   . Colonoscopy  2011    [facility]: normal colon, normal distal ileum  . Colonoscopy with propofol N/A 07/21/2014    Procedure: ATTEMPTED COLONOSCOPY WITH PROPOFOL-HAD TO STOP DUE TO BRADYCARDIA;  Surgeon: Daneil Dolin, MD;  Location: [facility];  Service: Endoscopy;  Laterality: N/A;  . Esophagogastroduodenoscopy (egd) with propofol N/A 07/21/2014    Procedure: ESOPHAGOGASTRODUODENOSCOPY (EGD) WITH PROPOFOL;  Surgeon: Daneil Dolin, MD;  Location: [facility];  Service: Endoscopy;  Laterality: N/A;  . Biopsy N/A 07/21/2014    Procedure: BIOPSY;  Surgeon: Daneil Dolin, MD;  Location: [facility];  Service: Endoscopy;  Laterality: N/A;    There were no vitals filed for this visit.                Subjective:  Pt reports no pain with noted improvement in his LE's.       [facility] Adult PT Treatment/Exercise - 01/31/16 0001    Manual Therapy   Manual Therapy Manual Lymphatic Drainage (MLD);Compression Bandaging   Manual therapy comments completed separate prior to bandaging   Manual Lymphatic Drainage (MLD) Completed to bilateral LE's inguinal axillary anastomosis; short neck and deep abdominals   Compression Bandaging using profore and toebandages as patient forgot his bandages  PT Education - 01/31/16 1200    Education provided Yes   Education Details Importance of sleeping in bed, not reciliner.  Educated on lymphema and compression bandaging/stockings   Person(s) Educated Patient   Methods Explanation   Comprehension Verbalized understanding          PT Short Term Goals - 01/25/16 1239    PT SHORT TERM GOAL #1   Title Pt will have a  5 cm reduction in bilateral  for improved fitting of clothing    Time 3   Period Weeks   Status New   PT SHORT TERM GOAL #2   Title Pt will be able to identify the signs and symptoms of cellulitis due to prolong lymphstasis   Time 1   Period Weeks   Status New   PT SHORT  TERM GOAL #3   Title Pt will have a 5 cm decrease at dorsum of foot to improve ability to don shoes    Time 3   Period Weeks   Status New   PT SHORT TERM GOAL #4   Title Pt will be I in remedial exercises to promote lymph circulaiton    Time 2   Period Weeks   Status New           PT Long Term Goals - 01/25/16 1240    PT LONG TERM GOAL #1   Title Pt will have a 7cm  reduction in fluid in bilateral LE  to promote independence of transfers. Pt to be able to transfer to bed and be able to lift his feet onto the bed on his own.    Time 6   Period Weeks   Status New   PT LONG TERM GOAL #2   Title Pt to have acquired compression garment for bilateral  LE to allow maintance phase of treatment to begin    Time 6   Period Weeks   Status New   PT LONG TERM GOAL #3   Title Pt to have no drainage from either LE to reduce risk of infection .   Time 3   Period Weeks   PT LONG TERM GOAL #4   Title Pt life impact to decrease by at least 15 points to show improved physical/functional concerns    Time 6   Period Weeks   Status New               Plan - 01/31/16 1201    Clinical Impression Statement Pt with loose dressings, however much improved with reduced swelling and no longer with oozing wounds.  Most edema is isolated into dorsal feet and toes.  Manual lymph drainage completed, moisturized LE's well and rebandaged using profore system.  Toes also bandaged with lymphedema toe wraps.  Pt instructed to bring all compression bandages next session.  Educated on importance fo sleeping in bed and getting out of the recliner.  Pt also with noted knee flexion contractures due to sitting so much and now has a sacral decubitus ulcer, whiich his nurse is taking care of.  Order sent to MD for compression garments and request to begin LE strenghening and gait to improve functional mobility.  Ptt instructed to keep dressings ingtact, however to remove if became uncomfotable or noted changes in LE  circulation.     Rehab Potential Good   PT Frequency 3x / week   PT Duration 6 weeks   PT Treatment/Interventions ADLs/Self Care Home Management;Compression bandaging;Manual techniques;Manual lymph drainage;Therapeutic exercise;Balance training;Therapeutic activities  PT Next Visit Plan Cut foam for bilateral LE next session. continue manual lymph drainage followed by short stretch compression dressing once drainage has stopped.  Pt will bring short stretch bandages to next treatment session    Consulted and Agree with Plan of Care Patient      Patient will benefit from skilled therapeutic intervention in order to improve the following deficits and impairments:  Pain, Increased edema, Decreased activity tolerance, Decreased mobility, Decreased range of motion  Visit Diagnosis: Lymphedema, not elsewhere classified  Unsteadiness on feet  Difficulty in walking, not elsewhere classified     Problem List Patient Active Problem List   Diagnosis Date Noted  . Peripheral edema 10/05/2015  . Diastolic CHF ([place]) 87/56/4332  . Diastolic dysfunction 95/18/8416  . Stage II pressure ulcer of sacral region 02/13/2015  . Atypical nevi 01/18/2015  . Intertriginous candidiasis 12/23/2014  . Hypothyroidism 11/30/2014  . Bradycardia 07/21/2014  . Sinus pause 07/21/2014  . Liver fibrosis ([place]) 06/29/2014  . IDA (iron deficiency anemia) 06/29/2014  . Iron deficiency anemia 05/31/2014  . Hepatomegaly 05/25/2014  . Difficulty walking 05/03/2014  . Knee pain, chronic 05/03/2014  . Stiffness of joint, not elsewhere classified, pelvic region and thigh 05/03/2014  . Dermatitis 04/12/2014  . HNP (herniated nucleus pulposus), lumbar 08/30/2013  . Spinal stenosis of lumbar region 07/06/2013  . Carpal tunnel syndrome 04/13/2013  . Lymphedema 04/13/2013  . Status post arthroscopy of left knee 04/13/2013  . DDD (degenerative disc disease), lumbosacral 04/13/2013  . Peripheral neuropathy ([place])  01/05/2013  . S/P arthroscopy of left knee 11/09/2012  . Osteoarthritis of left knee 10/27/2012  . Meniscus tear 10/07/2012  . Patellar tendonitis 07/01/2012  . NASH (nonalcoholic steatohepatitis) 05/22/2007  . Type II diabetes mellitus with neurological manifestations ([place]) 05/20/2007  . Hyperlipidemia 10/15/2006  . OBESITY, MORBID 10/15/2006  . Essential hypertension 10/15/2006  . GERD 10/15/2006    Teena Irani, PTA/CLT [phone]  01/31/2016, 12:05 PM  [facility] [address] [place], [place], [address] Phone: [phone]   Fax:  [phone]  Name: Michael [name] MRN: [id number] Date of Birth: Oct 11, 1945

## 2016-02-01 ENCOUNTER — Other Ambulatory Visit: Payer: Self-pay | Admitting: *Deleted

## 2016-02-01 NOTE — Telephone Encounter (Signed)
Referral has been done

## 2016-02-01 NOTE — Patient Outreach (Signed)
[facility]) Care Management  02/01/2016  Michael [name] 25-Nov-1945 [id number]  I spoke with [name] by phone today. He reports improvement in lymphedema symptoms. However, he tells me that he has a sacral wound with a signficant amount of broken skin across buttocks. [name] is awaiting a call form home health to see about their first visit. I advised [name] to call me if he has not heard from the home care agency by 12n tomorrow.   Plan: telephonic follow up for [name] next week.    [facility]  [phone]

## 2016-02-02 ENCOUNTER — Other Ambulatory Visit: Payer: Self-pay | Admitting: *Deleted

## 2016-02-02 DIAGNOSIS — L89152 Pressure ulcer of sacral region, stage 2: Secondary | ICD-10-CM

## 2016-02-02 NOTE — Patient Outreach (Signed)
[facility]) Care Management   02/02/2016  Michael [name] 1946-04-15 [id number]  Michael [name] is an 70 y.o. male with chronic diastolic heart failure with EF 60-65%, history of bradycardia, type II Diabetes, Hypertension, Iron Deficiency Anemia, NASH + Hepatomegaly, and degeneratove disc disease s/p surgery of L2-L3.   Michael [name] was admitted to the hospital in February with Acute/Chronic Diastolic Heart Failure and worsening lymphedema. Thereafter, [name] spent 21 days at [facility]. Michael [name] was referred to [facility] for assistance with management of CHF.   [facility] Care Management has been following Michael [name] in the community since his discharge from SNF. Focus of care management services has been around CHF and DM Disease management with particular attention to volume management and peripheral edema/lymphedema and lower extremity skin ailments.   I am seeing Michael [name] today at his request because of continued concerns about his lower extremity dressings and his sacral decubitus and perineal area skin concerns.   Subjective: "The home care agency didn't have anything about a referral and told me they'd help me but certainly couldn't get anyone out to see me before next week."  Objective:  70 year old male with CHF, DM, peripheral edema, lymphedema in outpatient PT for lymphedema treatment now with sacral decubitus.   Review of Systems  Constitutional: Negative for fever and chills.  Musculoskeletal: Negative for falls.  Skin:       Bilateral lower extremities with wraps in place; dressings are dry and there is no evidence of bleeding or drainage; Michael [name] denies any discomfort related to dressings  Skin around rectum and scrotum reddened but not bright in color, no drainage or bleeding, only mildly uncomfortable;   Duoderm/barrier dressing in place over area of sacral decubitus  Neurological: Negative for dizziness, focal weakness and weakness.    Psychiatric/Behavioral: Negative.     Physical Exam  Constitutional: [name] is oriented to person, place, and time. [name] appears well-developed and well-nourished. [name] is active.  Non-toxic appearance. [name] does not have a sickly appearance. [name] does not appear ill.  Neurological: [name] is alert and oriented to person, place, and time.  Skin:     Psychiatric: [name] has a normal mood and affect. His speech is normal and behavior is normal. Judgment and thought content normal. Cognition and memory are normal.    Encounter Medications:   Outpatient Encounter Prescriptions as of 02/02/2016  Medication Sig  . ACCU-CHEK AVIVA PLUS test strip USE AS DIRECTED TWICE DAILY.  Marland Kitchen aspirin EC 81 MG tablet Take 81 mg by mouth daily.  . B-D ULTRAFINE III SHORT PEN 31G X 8 MM MISC USE AS DIRECTED DAILY WITH LANTUS.  Marland Kitchen benazepril (LOTENSIN) 40 MG tablet TAKE ONE TABLET BY MOUTH DAILY.  . bumetanide (BUMEX) 2 MG tablet Take 1.5 tablets (3 mg total) by mouth 2 (two) times daily.  Marland Kitchen BYSTOLIC 10 MG tablet TAKE ONE TABLET BY MOUTH DAILY.  . clobetasol cream (TEMOVATE) 0.05 % APPLY TO AFFECTED AREA 2 TIMES DAILY.  . diazepam (VALIUM) 5 MG tablet TAKE 1 TABLET BY MOUTH AT BEDTIME AS NEEDED FOR ANXIETY.  . diclofenac sodium (VOLTAREN) 1 % GEL Apply 4 g topically 4 (four) times daily.  . Ferrous Sulfate (IRON) 325 (65 FE) MG TABS Take 1 tablet by mouth daily.   Marland Kitchen gabapentin (NEURONTIN) 400 MG capsule TAKE ONE CAPSULE BY MOUTH THREE TIMES DAILY.  Marland Kitchen LANTUS SOLOSTAR 100 UNIT/ML Solostar Pen INJECT 30 UNITS SUBCUTANEOUSLY AT BEDTIME.  Marland Kitchen levothyroxine (SYNTHROID,  LEVOTHROID) 88 MCG tablet Take 1 tablet (88 mcg total) by mouth daily. Take 1 tab PO Q AM 30 minutes prior to eating or taking any other medications.  . Magnesium 400 MG TABS Take 400 mg by mouth 2 (two) times daily.  . meloxicam (MOBIC) 15 MG tablet TAKE ONE TABLET BY MOUTH DAILY WITH FOOD.  Marland Kitchen metFORMIN (GLUCOPHAGE) 1000 MG tablet TAKE ONE TABLET BY MOUTH TWICE DAILY WITH A  MEAL.  Marland Kitchen metolazone (ZAROXOLYN) 2.5 MG tablet Take 1 tablet (2.5 mg total) by mouth daily.  . mupirocin cream (BACTROBAN) 2 % Apply 1 application topically 2 (two) times daily. To legs as needed  . mupirocin ointment (BACTROBAN) 2 % APPLY 1 APPLICATION TO THE LEGS 3 TIMES A DAY  . nystatin cream (MYCOSTATIN) Apply 1 application topically 2 (two) times daily.  Marland Kitchen omeprazole (PRILOSEC) 20 MG capsule TAKE ONE CAPSULE BY MOUTH TWICE DAILY.  Marland Kitchen oxyCODONE-acetaminophen (PERCOCET) 7.5-325 MG tablet Take one tablet by mouth every 6 hours as needed for severe pain. Max APAP 3gm/24hrs from all sources  . potassium chloride (K-DUR) 10 MEQ tablet Take 2 tablets in the AM / Take 1 tablet in the PM (Michael [name] taking differently: 20 mEq daily. Take 2 tablets in the AM / Take 1 tablet in the PM)  . pravastatin (PRAVACHOL) 40 MG tablet TAKE ONE TABLET BY MOUTH DAILY.   Assessment:  70 year old male living at home, working with outpatient PT, unable to get [facility] services until next week.   Acute Skin Condition - see description above; lower extremity dressings examined; peri-rectal and peri-scrotal skin cleansed with sensitive skin wipes, Duoderm dressing inspected, barrier cream applied to dry skin  Plan: Michael [name] will attend scheduled outpatient PT appointment for Tuesday. Michael [name] will reach out to provider with new or worsening symptoms. I will call Michael [name] next week to follow up.    [facility]  [phone]

## 2016-02-05 ENCOUNTER — Other Ambulatory Visit: Payer: Self-pay | Admitting: *Deleted

## 2016-02-05 ENCOUNTER — Telehealth: Payer: Self-pay | Admitting: Family Medicine

## 2016-02-05 NOTE — Telephone Encounter (Signed)
Pt called to see if Dr. Buelah Manis was still going to arrange for [facility] to come out to his home for his pressure ulcer. [phone]

## 2016-02-05 NOTE — Patient Outreach (Signed)
[facility]) Care Management  02/05/2016  Michael [name] 19-Dec-1945 [id number]  I received a call from [name] this afternoon. He told me he has still not heard from [facility] regarding home health services for wound care. He is scheduled to go to outpatient PT tomorrow but wants to get started with home health services as well. [name] was proactive and called the local [place] office and the person with whom he spoke he reported was very professional and helpful but said she didn't see an order for services for him. She was going to try to help by calling the main office in [facility]. [name] also called Dr. Dorian Heckle office and left a message for [name], LPN requesting assistance.   I've sent a message to [name] as well, requesting her help. I am happy to provide assistance in the community as needed.   Plan: [name] will call me if he has not heard from [facility] or Dr. Dorian Heckle office by tomorrow regarding his home care services.    [facility]  [phone]

## 2016-02-05 NOTE — Telephone Encounter (Signed)
Referral orders were placed on 02/02/2016.

## 2016-02-06 ENCOUNTER — Ambulatory Visit (HOSPITAL_COMMUNITY): Payer: Medicare Other | Admitting: Physical Therapy

## 2016-02-06 DIAGNOSIS — R262 Difficulty in walking, not elsewhere classified: Secondary | ICD-10-CM

## 2016-02-06 DIAGNOSIS — R2681 Unsteadiness on feet: Secondary | ICD-10-CM

## 2016-02-06 DIAGNOSIS — I890 Lymphedema, not elsewhere classified: Secondary | ICD-10-CM

## 2016-02-06 DIAGNOSIS — M25561 Pain in right knee: Secondary | ICD-10-CM | POA: Diagnosis not present

## 2016-02-06 DIAGNOSIS — M6281 Muscle weakness (generalized): Secondary | ICD-10-CM | POA: Diagnosis not present

## 2016-02-06 DIAGNOSIS — M25562 Pain in left knee: Secondary | ICD-10-CM | POA: Diagnosis not present

## 2016-02-06 NOTE — Telephone Encounter (Signed)
Received return call from [facility].   Advised that patient has start of care scheduled for 02/07/2016.  MD to be made aware.

## 2016-02-06 NOTE — Telephone Encounter (Signed)
Call placed to [facility] with [facility].   [place].

## 2016-02-06 NOTE — Telephone Encounter (Signed)
Please check on orders for [facility]

## 2016-02-06 NOTE — Therapy (Signed)
[place], [place], [address] Phone: [phone]   Fax:  [phone]  Physical Therapy Treatment  Patient Details  Name: Michael [name] MRN: [id number] Date of Birth: 09/07/1946 Referring Provider: Dr. Vic Blackbird  Encounter Date: 02/06/2016      PT End of Session - 02/06/16 1543    Visit Number 3   Number of Visits 18   Date for PT Re-Evaluation 02/24/16   Authorization Type UHC medicare    Authorization - Visit Number 3   Authorization - Number of Visits 18   PT Start Time 9983   PT Stop Time 1150   PT Time Calculation (min) 72 min   Activity Tolerance Patient tolerated treatment well   Behavior During Therapy [facility] for tasks assessed/performed      Past Medical History  Diagnosis Date  . COPD (chronic obstructive pulmonary disease) ([place])   . Type 2 diabetes mellitus ([place])   . Hyperlipidemia   . Essential hypertension   . Iron deficiency anemia   . NASH (nonalcoholic steatohepatitis)   . Diabetic neuropathy ([place])   . GERD (gastroesophageal reflux disease)   . Arthritis   . Depression   . Diastolic dysfunction   . Left knee DJD   . Lumbar spinal stenosis   . DDD (degenerative disc disease), lumbosacral   . Peripheral edema     Past Surgical History  Procedure Laterality Date  . Hernia repair    . Shoulder surgery      Rght-rotator cuff  . Lipoma removal      Stomach  . Knee arthroscopy with medial menisectomy Left 11/06/2012    Procedure: KNEE ARTHROSCOPY WITH MEDIAL MENISECTOMY;  Surgeon: Carole Civil, MD;  Location: [facility];  Service: Orthopedics;  Laterality: Left;  . Lumbar laminectomy/decompression microdiscectomy Left 08/30/2013    Procedure: LUMBAR LAMINECTOMY/DECOMPRESSION MICRODISCECTOMY LEFT  LUMBAR TWO THREE;  Surgeon: Otilio Connors, MD;  Location: [place] [facility];  Service: Neurosurgery;  Laterality: Left;  . Egd with enteroscopy  2011    [facility]: normal esophagus and stomach.  Normal duodenum, jejunum. No evidence of AVMs.   . Colonoscopy  2011    [facility] normal colon, normal distal ileum  . Colonoscopy with propofol N/A 07/21/2014    Procedure: ATTEMPTED COLONOSCOPY WITH PROPOFOL-HAD TO STOP DUE TO BRADYCARDIA;  Surgeon: Daneil Dolin, MD;  Location: [facility];  Service: Endoscopy;  Laterality: N/A;  . Esophagogastroduodenoscopy (egd) with propofol N/A 07/21/2014    Procedure: ESOPHAGOGASTRODUODENOSCOPY (EGD) WITH PROPOFOL;  Surgeon: Daneil Dolin, MD;  Location: [facility];  Service: Endoscopy;  Laterality: N/A;  . Biopsy N/A 07/21/2014    Procedure: BIOPSY;  Surgeon: Daneil Dolin, MD;  Location: [facility];  Service: Endoscopy;  Laterality: N/A;    There were no vitals filed for this visit.      Subjective Assessment - 02/06/16 1541    Subjective Pt states his CG took his bandages off yesterday and he took a bath.  Pt comes today with bandages from ankle to midcalf only.  Extreme edema in dorsal & plantar feet and toes.     Currently in Pain? No/denies                         [facility] Adult PT Treatment/Exercise - 02/06/16 0001    Manual Therapy   Manual Therapy Manual Lymphatic Drainage (MLD);Compression Bandaging   Manual therapy comments completed separate prior  to bandaging   Manual Lymphatic Drainage (MLD) Completed to bilateral LE's inguinal axillary anastomosis; short neck and deep abdominals   Compression Bandaging using profore to Lt LE and compression bandaging/1/2" foam to Rt LE                  PT Short Term Goals - 01/25/16 1239    PT SHORT TERM GOAL #1   Title Pt will have a  5 cm reduction in bilateral  for improved fitting of clothing    Time 3   Period Weeks   Status New   PT SHORT TERM GOAL #2   Title Pt will be able to identify the signs and symptoms of cellulitis due to prolong lymphstasis   Time 1   Period Weeks   Status New   PT SHORT TERM GOAL #3   Title Pt will have a 5 cm decrease at dorsum of foot to  improve ability to don shoes    Time 3   Period Weeks   Status New   PT SHORT TERM GOAL #4   Title Pt will be I in remedial exercises to promote lymph circulaiton    Time 2   Period Weeks   Status New           PT Long Term Goals - 01/25/16 1240    PT LONG TERM GOAL #1   Title Pt will have a 7cm  reduction in fluid in bilateral LE  to promote independence of transfers. Pt to be able to transfer to bed and be able to lift his feet onto the bed on his own.    Time 6   Period Weeks   Status New   PT LONG TERM GOAL #2   Title Pt to have acquired compression garment for bilateral  LE to allow maintance phase of treatment to begin    Time 6   Period Weeks   Status New   PT LONG TERM GOAL #3   Title Pt to have no drainage from either LE to reduce risk of infection .   Time 3   Period Weeks   PT LONG TERM GOAL #4   Title Pt life impact to decrease by at least 15 points to show improved physical/functional concerns    Time 6   Period Weeks   Status New               Plan - 02/06/16 1545    Clinical Impression Statement Pt with extreme swelling in dorsal and plantar aspects of bilateral feet and toes.  Explained importance of keeping compression on at all times at this point and keeping LE's elevated as much as possible.  Pt came today with some short stretch bandages he had found (2 6" and 2 10").  foam cut for Rt LE and went ahead and utilized these bandages on Rt LE.  Continued wtih profore for Lt LE.  Pt given another order form for more bandages to order to be able to wrap bilateral LE's.  Order also received to evaluate to begin ambulattion and strength training.  Compression garment order faxed to [facility] as he will need special order due to his large feet.     Rehab Potential Good   PT Frequency 3x / week   PT Duration 6 weeks   PT Treatment/Interventions ADLs/Self Care Home Management;Compression bandaging;Manual techniques;Manual lymph drainage;Therapeutic  exercise;Balance training;Therapeutic activities   PT Next Visit Plan Continue manual lymph drainage followed by  short stretch compression bandages, profore if unavailable.  Pt to be evaluated for LE strengthening /gait next couple sessions and follow up with [facility].   Consulted and Agree with Plan of Care Patient      Patient will benefit from skilled therapeutic intervention in order to improve the following deficits and impairments:  Pain, Increased edema, Decreased activity tolerance, Decreased mobility, Decreased range of motion  Visit Diagnosis: Lymphedema, not elsewhere classified  Difficulty in walking, not elsewhere classified  Unsteadiness on feet     Problem List Patient Active Problem List   Diagnosis Date Noted  . Peripheral edema 10/05/2015  . Diastolic CHF ([place]) 53/20/2334  . Diastolic dysfunction 35/68/6168  . Stage II pressure ulcer of sacral region 02/13/2015  . Atypical nevi 01/18/2015  . Intertriginous candidiasis 12/23/2014  . Hypothyroidism 11/30/2014  . Bradycardia 07/21/2014  . Sinus pause 07/21/2014  . Liver fibrosis ([place]) 06/29/2014  . IDA (iron deficiency anemia) 06/29/2014  . Iron deficiency anemia 05/31/2014  . Hepatomegaly 05/25/2014  . Difficulty walking 05/03/2014  . Knee pain, chronic 05/03/2014  . Stiffness of joint, not elsewhere classified, pelvic region and thigh 05/03/2014  . Dermatitis 04/12/2014  . HNP (herniated nucleus pulposus), lumbar 08/30/2013  . Spinal stenosis of lumbar region 07/06/2013  . Carpal tunnel syndrome 04/13/2013  . Lymphedema 04/13/2013  . Status post arthroscopy of left knee 04/13/2013  . DDD (degenerative disc disease), lumbosacral 04/13/2013  . Peripheral neuropathy ([place]) 01/05/2013  . S/P arthroscopy of left knee 11/09/2012  . Osteoarthritis of left knee 10/27/2012  . Meniscus tear 10/07/2012  . Patellar tendonitis 07/01/2012  . NASH (nonalcoholic steatohepatitis) 05/22/2007  . Type II diabetes  mellitus with neurological manifestations ([place]) 05/20/2007  . Hyperlipidemia 10/15/2006  . OBESITY, MORBID 10/15/2006  . Essential hypertension 10/15/2006  . GERD 10/15/2006    Teena Irani, PTA/CLT [phone]  02/06/2016, 3:52 PM  [facility] [address] [place], [place], [address] Phone: [phone]   Fax:  [phone]  Name: MIKIAH DEMOND MRN: [id number] Date of Birth: 1946-09-15

## 2016-02-07 DIAGNOSIS — E039 Hypothyroidism, unspecified: Secondary | ICD-10-CM | POA: Diagnosis not present

## 2016-02-07 DIAGNOSIS — E785 Hyperlipidemia, unspecified: Secondary | ICD-10-CM | POA: Diagnosis not present

## 2016-02-07 DIAGNOSIS — L89312 Pressure ulcer of right buttock, stage 2: Secondary | ICD-10-CM | POA: Diagnosis not present

## 2016-02-07 DIAGNOSIS — E1140 Type 2 diabetes mellitus with diabetic neuropathy, unspecified: Secondary | ICD-10-CM | POA: Diagnosis not present

## 2016-02-07 DIAGNOSIS — M1712 Unilateral primary osteoarthritis, left knee: Secondary | ICD-10-CM | POA: Diagnosis not present

## 2016-02-07 DIAGNOSIS — K219 Gastro-esophageal reflux disease without esophagitis: Secondary | ICD-10-CM | POA: Diagnosis not present

## 2016-02-07 DIAGNOSIS — Z79891 Long term (current) use of opiate analgesic: Secondary | ICD-10-CM | POA: Diagnosis not present

## 2016-02-07 DIAGNOSIS — Z7984 Long term (current) use of oral hypoglycemic drugs: Secondary | ICD-10-CM | POA: Diagnosis not present

## 2016-02-07 DIAGNOSIS — L89321 Pressure ulcer of left buttock, stage 1: Secondary | ICD-10-CM | POA: Diagnosis not present

## 2016-02-07 DIAGNOSIS — I110 Hypertensive heart disease with heart failure: Secondary | ICD-10-CM | POA: Diagnosis not present

## 2016-02-07 DIAGNOSIS — I5032 Chronic diastolic (congestive) heart failure: Secondary | ICD-10-CM | POA: Diagnosis not present

## 2016-02-07 DIAGNOSIS — I890 Lymphedema, not elsewhere classified: Secondary | ICD-10-CM | POA: Diagnosis not present

## 2016-02-07 DIAGNOSIS — M549 Dorsalgia, unspecified: Secondary | ICD-10-CM | POA: Diagnosis not present

## 2016-02-07 DIAGNOSIS — K7581 Nonalcoholic steatohepatitis (NASH): Secondary | ICD-10-CM | POA: Diagnosis not present

## 2016-02-07 DIAGNOSIS — Z794 Long term (current) use of insulin: Secondary | ICD-10-CM | POA: Diagnosis not present

## 2016-02-07 NOTE — Telephone Encounter (Signed)
noted 

## 2016-02-08 ENCOUNTER — Ambulatory Visit (HOSPITAL_COMMUNITY): Payer: Medicare Other | Admitting: Physical Therapy

## 2016-02-08 DIAGNOSIS — R262 Difficulty in walking, not elsewhere classified: Secondary | ICD-10-CM | POA: Diagnosis not present

## 2016-02-08 DIAGNOSIS — I890 Lymphedema, not elsewhere classified: Secondary | ICD-10-CM

## 2016-02-08 DIAGNOSIS — M6281 Muscle weakness (generalized): Secondary | ICD-10-CM | POA: Diagnosis not present

## 2016-02-08 DIAGNOSIS — R2681 Unsteadiness on feet: Secondary | ICD-10-CM | POA: Diagnosis not present

## 2016-02-08 DIAGNOSIS — M25562 Pain in left knee: Secondary | ICD-10-CM | POA: Diagnosis not present

## 2016-02-08 DIAGNOSIS — M25561 Pain in right knee: Secondary | ICD-10-CM | POA: Diagnosis not present

## 2016-02-08 NOTE — Therapy (Signed)
[place], [place], [address] Phone: [phone]   Fax:  [phone]  Physical Therapy Treatment  Patient Details  Name: Michael [name] MRN: [id number] Date of Birth: 1946-06-19 Referring Provider: Dr. Vic Blackbird  Encounter Date: 02/08/2016      PT End of Session - 02/08/16 0947    Visit Number 4   Number of Visits 18   Date for PT Re-Evaluation 02/24/16   Authorization Type UHC medicare    Authorization - Visit Number 4   Authorization - Number of Visits 18   PT Start Time 0820   PT Stop Time 0930   PT Time Calculation (min) 70 min   Activity Tolerance Patient tolerated treatment well   Behavior During Therapy [facility] for tasks assessed/performed      Past Medical History  Diagnosis Date  . COPD (chronic obstructive pulmonary disease) ([place])   . Type 2 diabetes mellitus ([place])   . Hyperlipidemia   . Essential hypertension   . Iron deficiency anemia   . NASH (nonalcoholic steatohepatitis)   . Diabetic neuropathy ([place])   . GERD (gastroesophageal reflux disease)   . Arthritis   . Depression   . Diastolic dysfunction   . Left knee DJD   . Lumbar spinal stenosis   . DDD (degenerative disc disease), lumbosacral   . Peripheral edema     Past Surgical History  Procedure Laterality Date  . Hernia repair    . Shoulder surgery      Rght-rotator cuff  . Lipoma removal      Stomach  . Knee arthroscopy with medial menisectomy Left 11/06/2012    Procedure: KNEE ARTHROSCOPY WITH MEDIAL MENISECTOMY;  Surgeon: Carole Civil, MD;  Location: [facility];  Service: Orthopedics;  Laterality: Left;  . Lumbar laminectomy/decompression microdiscectomy Left 08/30/2013    Procedure: LUMBAR LAMINECTOMY/DECOMPRESSION MICRODISCECTOMY LEFT  LUMBAR TWO THREE;  Surgeon: Otilio Connors, MD;  Location: [place] [facility];  Service: Neurosurgery;  Laterality: Left;  . Egd with enteroscopy  2011    [facility]: normal esophagus and  stomach. Normal duodenum, jejunum. No evidence of AVMs.   . Colonoscopy  2011    [facility]: normal colon, normal distal ileum  . Colonoscopy with propofol N/A 07/21/2014    Procedure: ATTEMPTED COLONOSCOPY WITH PROPOFOL-HAD TO STOP DUE TO BRADYCARDIA;  Surgeon: Daneil Dolin, MD;  Location: [facility];  Service: Endoscopy;  Laterality: N/A;  . Esophagogastroduodenoscopy (egd) with propofol N/A 07/21/2014    Procedure: ESOPHAGOGASTRODUODENOSCOPY (EGD) WITH PROPOFOL;  Surgeon: Daneil Dolin, MD;  Location: [facility];  Service: Endoscopy;  Laterality: N/A;  . Biopsy N/A 07/21/2014    Procedure: BIOPSY;  Surgeon: Daneil Dolin, MD;  Location: [facility];  Service: Endoscopy;  Laterality: N/A;    There were no vitals filed for this visit.      Subjective Assessment - 02/08/16 0941    Subjective Pt comes today with bandages intact, no pain reported but states his LE's are itching.     Currently in Pain? No/denies                         [facility] Adult PT Treatment/Exercise - 02/08/16 0942    Manual Therapy   Manual Therapy Manual Lymphatic Drainage (MLD);Compression Bandaging   Manual therapy comments completed separate prior to bandaging   Manual Lymphatic Drainage (MLD) Completed to bilateral LE's   Compression Bandaging using profore to Lt  LE and compression bandaging/1/2" foam to Rt LE                  PT Short Term Goals - 01/25/16 1239    PT SHORT TERM GOAL #1   Title Pt will have a  5 cm reduction in bilateral  for improved fitting of clothing    Time 3   Period Weeks   Status New   PT SHORT TERM GOAL #2   Title Pt will be able to identify the signs and symptoms of cellulitis due to prolong lymphstasis   Time 1   Period Weeks   Status New   PT SHORT TERM GOAL #3   Title Pt will have a 5 cm decrease at dorsum of foot to improve ability to don shoes    Time 3   Period Weeks   Status New   PT SHORT TERM GOAL #4   Title Pt will be I in remedial exercises to  promote lymph circulaiton    Time 2   Period Weeks   Status New           PT Long Term Goals - 01/25/16 1240    PT LONG TERM GOAL #1   Title Pt will have a 7cm  reduction in fluid in bilateral LE  to promote independence of transfers. Pt to be able to transfer to bed and be able to lift his feet onto the bed on his own.    Time 6   Period Weeks   Status New   PT LONG TERM GOAL #2   Title Pt to have acquired compression garment for bilateral  LE to allow maintance phase of treatment to begin    Time 6   Period Weeks   Status New   PT LONG TERM GOAL #3   Title Pt to have no drainage from either LE to reduce risk of infection .   Time 3   Period Weeks   PT LONG TERM GOAL #4   Title Pt life impact to decrease by at least 15 points to show improved physical/functional concerns    Time 6   Period Weeks   Status New               Plan - 02/08/16 8937    Clinical Impression Statement Pt with overall reduction in bilateral LE's, especially Lt LE.  Dorsum of feet and toes remain most edematous, however compliant with keeping bandages intact.  Instructed pt to keep these intact and may remove prior to next appt to bathe.  Pt has appt at [facility] next Thursday to discuss help with financial responsitbility.  Pt wtihout issues and reported overall comfort with bandaging.     Rehab Potential Good   PT Frequency 3x / week   PT Duration 6 weeks   PT Treatment/Interventions ADLs/Self Care Home Management;Compression bandaging;Manual techniques;Manual lymph drainage;Therapeutic exercise;Balance training;Therapeutic activities   PT Next Visit Plan Continue manual lymph drainage followed by short stretch compression bandages, profore if unavailable.  Pt to be evaluated for LE strengthening /gait next couple sessions and follow up with [facility].   Consulted and Agree with Plan of Care Patient      Patient will benefit from skilled therapeutic intervention in order to improve the following  deficits and impairments:  Pain, Increased edema, Decreased activity tolerance, Decreased mobility, Decreased range of motion  Visit Diagnosis: Lymphedema, not elsewhere classified  Difficulty in walking, not elsewhere classified  Problem List Patient Active Problem List   Diagnosis Date Noted  . Peripheral edema 10/05/2015  . Diastolic CHF ([name]) 28/97/9150  . Diastolic dysfunction 41/36/4383  . Stage II pressure ulcer of sacral region 02/13/2015  . Atypical nevi 01/18/2015  . Intertriginous candidiasis 12/23/2014  . Hypothyroidism 11/30/2014  . Bradycardia 07/21/2014  . Sinus pause 07/21/2014  . Liver fibrosis ([place]) 06/29/2014  . IDA (iron deficiency anemia) 06/29/2014  . Iron deficiency anemia 05/31/2014  . Hepatomegaly 05/25/2014  . Difficulty walking 05/03/2014  . Knee pain, chronic 05/03/2014  . Stiffness of joint, not elsewhere classified, pelvic region and thigh 05/03/2014  . Dermatitis 04/12/2014  . HNP (herniated nucleus pulposus), lumbar 08/30/2013  . Spinal stenosis of lumbar region 07/06/2013  . Carpal tunnel syndrome 04/13/2013  . Lymphedema 04/13/2013  . Status post arthroscopy of left knee 04/13/2013  . DDD (degenerative disc disease), lumbosacral 04/13/2013  . Peripheral neuropathy ([place]) 01/05/2013  . S/P arthroscopy of left knee 11/09/2012  . Osteoarthritis of left knee 10/27/2012  . Meniscus tear 10/07/2012  . Patellar tendonitis 07/01/2012  . NASH (nonalcoholic steatohepatitis) 05/22/2007  . Type II diabetes mellitus with neurological manifestations ([place]) 05/20/2007  . Hyperlipidemia 10/15/2006  . OBESITY, MORBID 10/15/2006  . Essential hypertension 10/15/2006  . GERD 10/15/2006    Teena Irani, PTA/CLT [phone]  02/08/2016, 9:51 AM  [facility] [address] [place], [place], [address] Phone: [phone]   Fax:  [phone]  Name: DEMARI GALES MRN: [id number] Date of Birth:  Aug 05, 1946

## 2016-02-09 ENCOUNTER — Other Ambulatory Visit: Payer: Self-pay | Admitting: *Deleted

## 2016-02-09 DIAGNOSIS — K219 Gastro-esophageal reflux disease without esophagitis: Secondary | ICD-10-CM | POA: Diagnosis not present

## 2016-02-09 DIAGNOSIS — I5032 Chronic diastolic (congestive) heart failure: Secondary | ICD-10-CM | POA: Diagnosis not present

## 2016-02-09 DIAGNOSIS — I890 Lymphedema, not elsewhere classified: Secondary | ICD-10-CM | POA: Diagnosis not present

## 2016-02-09 DIAGNOSIS — L89312 Pressure ulcer of right buttock, stage 2: Secondary | ICD-10-CM | POA: Diagnosis not present

## 2016-02-09 DIAGNOSIS — E039 Hypothyroidism, unspecified: Secondary | ICD-10-CM | POA: Diagnosis not present

## 2016-02-09 DIAGNOSIS — Z79891 Long term (current) use of opiate analgesic: Secondary | ICD-10-CM | POA: Diagnosis not present

## 2016-02-09 DIAGNOSIS — Z7984 Long term (current) use of oral hypoglycemic drugs: Secondary | ICD-10-CM | POA: Diagnosis not present

## 2016-02-09 DIAGNOSIS — E1140 Type 2 diabetes mellitus with diabetic neuropathy, unspecified: Secondary | ICD-10-CM | POA: Diagnosis not present

## 2016-02-09 DIAGNOSIS — K7581 Nonalcoholic steatohepatitis (NASH): Secondary | ICD-10-CM | POA: Diagnosis not present

## 2016-02-09 DIAGNOSIS — L89321 Pressure ulcer of left buttock, stage 1: Secondary | ICD-10-CM | POA: Diagnosis not present

## 2016-02-09 DIAGNOSIS — Z794 Long term (current) use of insulin: Secondary | ICD-10-CM | POA: Diagnosis not present

## 2016-02-09 DIAGNOSIS — E785 Hyperlipidemia, unspecified: Secondary | ICD-10-CM | POA: Diagnosis not present

## 2016-02-09 DIAGNOSIS — M549 Dorsalgia, unspecified: Secondary | ICD-10-CM | POA: Diagnosis not present

## 2016-02-09 DIAGNOSIS — I110 Hypertensive heart disease with heart failure: Secondary | ICD-10-CM | POA: Diagnosis not present

## 2016-02-09 DIAGNOSIS — M1712 Unilateral primary osteoarthritis, left knee: Secondary | ICD-10-CM | POA: Diagnosis not present

## 2016-02-09 NOTE — Telephone Encounter (Signed)
Can we check in on Michael [name], its been some time since we heard about his swelling. Please clarify how he is taking his diuretics as well. Can we set up a BMET and Mg with his [name]?   [name] MD

## 2016-02-09 NOTE — Telephone Encounter (Signed)
Pt says he has been going to wound care center 2 times a week. Says swelling has gone down, says legs looks good blisters have cleared up. Will be getting compression stockings

## 2016-02-09 NOTE — Addendum Note (Signed)
Addended by: Julian Hy T on: 02/09/2016 01:39 PM   Modules accepted: Orders

## 2016-02-09 NOTE — Telephone Encounter (Signed)
From the wound care center, says weight has been running 201-206lbs. Says nurse was there this this morning. Will place orders for labs and forward to [facility]

## 2016-02-09 NOTE — Patient Outreach (Signed)
[facility]) Care Management  02/09/2016  Michael [name] 1945-10-09 [id number]  I spoke with [name] by phone today. He was very happy to report that his treatments for lymphedema at [facility] are helping his legs. He is also being seen by the home health nurse for sacral decubitus care. She visited this morning but will not be back until later next week.   I scheduled a visit with Mr. Buckle for Tuesday at 1pm to assess [name] progress and obtain labs for cardiology follow up.    [facility]  [phone]

## 2016-02-10 ENCOUNTER — Other Ambulatory Visit: Payer: Self-pay | Admitting: Family Medicine

## 2016-02-12 ENCOUNTER — Telehealth (HOSPITAL_COMMUNITY): Payer: Self-pay | Admitting: Physical Therapy

## 2016-02-12 ENCOUNTER — Ambulatory Visit (HOSPITAL_COMMUNITY): Payer: Medicare Other | Admitting: Physical Therapy

## 2016-02-12 DIAGNOSIS — Z794 Long term (current) use of insulin: Secondary | ICD-10-CM | POA: Diagnosis not present

## 2016-02-12 DIAGNOSIS — K219 Gastro-esophageal reflux disease without esophagitis: Secondary | ICD-10-CM | POA: Diagnosis not present

## 2016-02-12 DIAGNOSIS — I5032 Chronic diastolic (congestive) heart failure: Secondary | ICD-10-CM | POA: Diagnosis not present

## 2016-02-12 DIAGNOSIS — L89312 Pressure ulcer of right buttock, stage 2: Secondary | ICD-10-CM | POA: Diagnosis not present

## 2016-02-12 DIAGNOSIS — E785 Hyperlipidemia, unspecified: Secondary | ICD-10-CM | POA: Diagnosis not present

## 2016-02-12 DIAGNOSIS — R2681 Unsteadiness on feet: Secondary | ICD-10-CM

## 2016-02-12 DIAGNOSIS — L89321 Pressure ulcer of left buttock, stage 1: Secondary | ICD-10-CM | POA: Diagnosis not present

## 2016-02-12 DIAGNOSIS — R262 Difficulty in walking, not elsewhere classified: Secondary | ICD-10-CM | POA: Diagnosis not present

## 2016-02-12 DIAGNOSIS — I110 Hypertensive heart disease with heart failure: Secondary | ICD-10-CM | POA: Diagnosis not present

## 2016-02-12 DIAGNOSIS — M1712 Unilateral primary osteoarthritis, left knee: Secondary | ICD-10-CM | POA: Diagnosis not present

## 2016-02-12 DIAGNOSIS — Z79891 Long term (current) use of opiate analgesic: Secondary | ICD-10-CM | POA: Diagnosis not present

## 2016-02-12 DIAGNOSIS — M549 Dorsalgia, unspecified: Secondary | ICD-10-CM | POA: Diagnosis not present

## 2016-02-12 DIAGNOSIS — K7581 Nonalcoholic steatohepatitis (NASH): Secondary | ICD-10-CM | POA: Diagnosis not present

## 2016-02-12 DIAGNOSIS — M25562 Pain in left knee: Secondary | ICD-10-CM | POA: Diagnosis not present

## 2016-02-12 DIAGNOSIS — M25561 Pain in right knee: Secondary | ICD-10-CM

## 2016-02-12 DIAGNOSIS — Z7984 Long term (current) use of oral hypoglycemic drugs: Secondary | ICD-10-CM | POA: Diagnosis not present

## 2016-02-12 DIAGNOSIS — M6281 Muscle weakness (generalized): Secondary | ICD-10-CM | POA: Diagnosis not present

## 2016-02-12 DIAGNOSIS — I890 Lymphedema, not elsewhere classified: Secondary | ICD-10-CM | POA: Diagnosis not present

## 2016-02-12 DIAGNOSIS — E039 Hypothyroidism, unspecified: Secondary | ICD-10-CM | POA: Diagnosis not present

## 2016-02-12 DIAGNOSIS — E1140 Type 2 diabetes mellitus with diabetic neuropathy, unspecified: Secondary | ICD-10-CM | POA: Diagnosis not present

## 2016-02-12 NOTE — Telephone Encounter (Signed)
Refill appropriate and filled per protocol. 

## 2016-02-12 NOTE — Telephone Encounter (Signed)
left message with Brandy at [facility] regarding urgent need to measure for garments.  Paperwork was faxed on 5/9 and patient has yet to receive contact from provider.   Teena Irani, PTA/CLT [phone]

## 2016-02-12 NOTE — Therapy (Signed)
[place], [place], [address] Phone: [phone]   Fax:  [phone]  Physical Therapy Treatment  Patient Details  Name: Michael [name] MRN: [id number] Date of Birth: 03/19/1946 Referring Provider: Dr. Vic Blackbird  Encounter Date: 02/12/2016      PT End of Session - 02/12/16 1108    Visit Number 5   Number of Visits 18   Date for PT Re-Evaluation 02/24/16   Authorization Type UHC medicare    Authorization - Visit Number 5   Authorization - Number of Visits 18   PT Start Time 1000   PT Stop Time 1100   PT Time Calculation (min) 60 min   Activity Tolerance Patient tolerated treatment well   Behavior During Therapy [facility] for tasks assessed/performed      Past Medical History  Diagnosis Date  . COPD (chronic obstructive pulmonary disease) ([place])   . Type 2 diabetes mellitus ([place])   . Hyperlipidemia   . Essential hypertension   . Iron deficiency anemia   . NASH (nonalcoholic steatohepatitis)   . Diabetic neuropathy ([place])   . GERD (gastroesophageal reflux disease)   . Arthritis   . Depression   . Diastolic dysfunction   . Left knee DJD   . Lumbar spinal stenosis   . DDD (degenerative disc disease), lumbosacral   . Peripheral edema     Past Surgical History  Procedure Laterality Date  . Hernia repair    . Shoulder surgery      Rght-rotator cuff  . Lipoma removal      Stomach  . Knee arthroscopy with medial menisectomy Left 11/06/2012    Procedure: KNEE ARTHROSCOPY WITH MEDIAL MENISECTOMY;  Surgeon: Carole Civil, MD;  Location: [facility];  Service: Orthopedics;  Laterality: Left;  . Lumbar laminectomy/decompression microdiscectomy Left 08/30/2013    Procedure: LUMBAR LAMINECTOMY/DECOMPRESSION MICRODISCECTOMY LEFT  LUMBAR TWO THREE;  Surgeon: Otilio Connors, MD;  Location: [place] [facility];  Service: Neurosurgery;  Laterality: Left;  . Egd with enteroscopy  2011    [facility]: normal esophagus and  stomach. Normal duodenum, jejunum. No evidence of AVMs.   . Colonoscopy  2011    [facility]: normal colon, normal distal ileum  . Colonoscopy with propofol N/A 07/21/2014    Procedure: ATTEMPTED COLONOSCOPY WITH PROPOFOL-HAD TO STOP DUE TO BRADYCARDIA;  Surgeon: Daneil Dolin, MD;  Location: [facility];  Service: Endoscopy;  Laterality: N/A;  . Esophagogastroduodenoscopy (egd) with propofol N/A 07/21/2014    Procedure: ESOPHAGOGASTRODUODENOSCOPY (EGD) WITH PROPOFOL;  Surgeon: Daneil Dolin, MD;  Location: [facility];  Service: Endoscopy;  Laterality: N/A;  . Biopsy N/A 07/21/2014    Procedure: BIOPSY;  Surgeon: Daneil Dolin, MD;  Location: [facility];  Service: Endoscopy;  Laterality: N/A;    There were no vitals filed for this visit.      Subjective Assessment - 02/12/16 1059    Subjective Pt comes today without bandages.  STates he removed them this morning for a shower prior to his appointment.  Pt without c/o pain or difficulties.     Currently in Pain? No/denies               LYMPHEDEMA/ONCOLOGY QUESTIONNAIRE - 02/12/16 1111    Right Lower Extremity Lymphedema   At Midpatella/Popliteal Crease 41 cm  was 44.3   30 cm Proximal to Floor at Lateral Plantar Foot 41 cm  was 46   20 cm Proximal to Floor at Lateral Plantar  Foot 31 1  was 42.5   10 cm Proximal to Floor at Lateral Malleoli 27.8 cm  was 37.3   Circumference of ankle/heel 36 cm.  was 36   5 cm Proximal to 1st MTP Joint 28.6 cm  was 31.4   Across MTP Joint 28.4 cm  was 31.5   Around Proximal Great Toe 10 cm  was 12.5   Left Lower Extremity Lymphedema   At Midpatella/Popliteal Crease 44 cm  was 41   30 cm Proximal to Floor at Lateral Plantar Foot 37.8 cm  was 46.5   20 cm Proximal to Floor at Lateral Plantar Foot 28 cm  was 41.5   10 cm Proximal to Floor at Lateral Malleoli 25.4 cm  was 35   Circumference of ankle/heel 34.5 cm.  was 39.2   5 cm Proximal to 1st MTP Joint 28 cm  was 31.3   Across MTP Joint 28  cm  was 32.8   Around Proximal Great Toe 10 cm  was 12                            PT Short Term Goals - 02/12/16 1117    PT SHORT TERM GOAL #1   Title Pt will have a  5 cm reduction in bilateral  for improved fitting of clothing    Time 3   Period Weeks   Status Achieved   PT SHORT TERM GOAL #2   Title Pt will be able to identify the signs and symptoms of cellulitis due to prolong lymphstasis   Time 1   Period Weeks   Status On-going   PT SHORT TERM GOAL #3   Title Pt will have a 5 cm decrease at dorsum of foot to improve ability to don shoes    Time 3   Period Weeks   Status On-going   PT SHORT TERM GOAL #4   Title Pt will be I in remedial exercises to promote lymph circulaiton    Time 2   Period Weeks   Status On-going           PT Long Term Goals - 02/12/16 1117    PT LONG TERM GOAL #1   Title Pt will have a 7cm  reduction in fluid in bilateral LE  to promote independence of transfers. Pt to be able to transfer to bed and be able to lift his feet onto the bed on his own.    Time 6   Period Weeks   Status On-going   PT LONG TERM GOAL #2   Title Pt to have acquired compression garment for bilateral  LE to allow maintance phase of treatment to begin    Time 6   Period Weeks   Status On-going   PT LONG TERM GOAL #3   Title Pt to have no drainage from either LE to reduce risk of infection .   Time 3   Period Weeks   Status On-going   PT LONG TERM GOAL #4   Title Pt life impact to decrease by at least 15 points to show improved physical/functional concerns    Time 6   Period Weeks   Status On-going               Plan - 02/12/16 1109    Clinical Impression Statement Pt reports his additional bandages are ordered for his Lt LE.  Bilateral LE's remeasured today with average loss of  4.7cm from Rt LE and 6.2cm loss from Lt LE.  LE's are decompressing well with most congestion till concentrated dorsum of feet and toes.  Pt requests his toes  not be wrapped.  Contacted [employer], leaving Brandy a voicemail regarding need to measure LE's ASAP.     Rehab Potential Good   PT Frequency 3x / week   PT Duration 6 weeks   PT Treatment/Interventions ADLs/Self Care Home Management;Compression bandaging;Manual techniques;Manual lymph drainage;Therapeutic exercise;Balance training;Therapeutic activities   PT Next Visit Plan Continue manual lymph drainage followed by short stretch compression bandages, profore if unavailable.  Pt to be evaluated for LE strengthening /gait next couple sessions and follow up with [facility].   Consulted and Agree with Plan of Care Patient      Patient will benefit from skilled therapeutic intervention in order to improve the following deficits and impairments:  Pain, Increased edema, Decreased activity tolerance, Decreased mobility, Decreased range of motion  Visit Diagnosis: Lymphedema, not elsewhere classified  Difficulty in walking, not elsewhere classified  Unsteadiness on feet  Arthralgia of both lower legs     Problem List Patient Active Problem List   Diagnosis Date Noted  . Peripheral edema 10/05/2015  . Diastolic CHF ([place]) 82/57/4935  . Diastolic dysfunction 52/17/4715  . Stage II pressure ulcer of sacral region 02/13/2015  . Atypical nevi 01/18/2015  . Intertriginous candidiasis 12/23/2014  . Hypothyroidism 11/30/2014  . Bradycardia 07/21/2014  . Sinus pause 07/21/2014  . Liver fibrosis ([place]) 06/29/2014  . IDA (iron deficiency anemia) 06/29/2014  . Iron deficiency anemia 05/31/2014  . Hepatomegaly 05/25/2014  . Difficulty walking 05/03/2014  . Knee pain, chronic 05/03/2014  . Stiffness of joint, not elsewhere classified, pelvic region and thigh 05/03/2014  . Dermatitis 04/12/2014  . HNP (herniated nucleus pulposus), lumbar 08/30/2013  . Spinal stenosis of lumbar region 07/06/2013  . Carpal tunnel syndrome 04/13/2013  . Lymphedema 04/13/2013  . Status post arthroscopy of  left knee 04/13/2013  . DDD (degenerative disc disease), lumbosacral 04/13/2013  . Peripheral neuropathy ([place]) 01/05/2013  . S/P arthroscopy of left knee 11/09/2012  . Osteoarthritis of left knee 10/27/2012  . Meniscus tear 10/07/2012  . Patellar tendonitis 07/01/2012  . NASH (nonalcoholic steatohepatitis) 05/22/2007  . Type II diabetes mellitus with neurological manifestations ([place]) 05/20/2007  . Hyperlipidemia 10/15/2006  . OBESITY, MORBID 10/15/2006  . Essential hypertension 10/15/2006  . GERD 10/15/2006    Teena Irani, PTA/CLT [phone]  02/12/2016, 11:17 AM  [facility] [address] [place], [place], [address] Phone: [phone]   Fax:  [phone]  Name: TUNIS GENTLE MRN: [id number] Date of Birth: November 07, 1945

## 2016-02-13 ENCOUNTER — Ambulatory Visit: Payer: Self-pay | Admitting: *Deleted

## 2016-02-14 ENCOUNTER — Encounter (HOSPITAL_COMMUNITY): Payer: Medicare Other | Admitting: Physical Therapy

## 2016-02-14 ENCOUNTER — Telehealth (HOSPITAL_COMMUNITY): Payer: Self-pay | Admitting: Physical Therapy

## 2016-02-14 NOTE — Telephone Encounter (Signed)
[place] from [place] called mid lunch and spoke with Azucena Freed, PT.  She informed her that she has been unable to contact Mr. Humphres and request appt on Wed 24th for measuring.  Tried to return call to [facility], however received answering machine.  Left message informing them that his appt Wednesday is at 9:45 here so she would need to be here to measure at 9:00. Teena Irani, PTA/CLT [phone]

## 2016-02-15 ENCOUNTER — Ambulatory Visit (HOSPITAL_COMMUNITY): Payer: Medicare Other | Admitting: Physical Therapy

## 2016-02-15 ENCOUNTER — Encounter: Payer: Self-pay | Admitting: *Deleted

## 2016-02-15 ENCOUNTER — Encounter (HOSPITAL_COMMUNITY): Payer: Medicare Other | Admitting: Physical Therapy

## 2016-02-15 ENCOUNTER — Telehealth (HOSPITAL_COMMUNITY): Payer: Self-pay

## 2016-02-15 DIAGNOSIS — K219 Gastro-esophageal reflux disease without esophagitis: Secondary | ICD-10-CM | POA: Diagnosis not present

## 2016-02-15 DIAGNOSIS — M1712 Unilateral primary osteoarthritis, left knee: Secondary | ICD-10-CM | POA: Diagnosis not present

## 2016-02-15 DIAGNOSIS — L89321 Pressure ulcer of left buttock, stage 1: Secondary | ICD-10-CM | POA: Diagnosis not present

## 2016-02-15 DIAGNOSIS — K7581 Nonalcoholic steatohepatitis (NASH): Secondary | ICD-10-CM | POA: Diagnosis not present

## 2016-02-15 DIAGNOSIS — I890 Lymphedema, not elsewhere classified: Secondary | ICD-10-CM | POA: Diagnosis not present

## 2016-02-15 DIAGNOSIS — E039 Hypothyroidism, unspecified: Secondary | ICD-10-CM | POA: Diagnosis not present

## 2016-02-15 DIAGNOSIS — L89312 Pressure ulcer of right buttock, stage 2: Secondary | ICD-10-CM | POA: Diagnosis not present

## 2016-02-15 DIAGNOSIS — I5032 Chronic diastolic (congestive) heart failure: Secondary | ICD-10-CM | POA: Diagnosis not present

## 2016-02-15 DIAGNOSIS — M549 Dorsalgia, unspecified: Secondary | ICD-10-CM | POA: Diagnosis not present

## 2016-02-15 DIAGNOSIS — Z79891 Long term (current) use of opiate analgesic: Secondary | ICD-10-CM | POA: Diagnosis not present

## 2016-02-15 DIAGNOSIS — Z794 Long term (current) use of insulin: Secondary | ICD-10-CM | POA: Diagnosis not present

## 2016-02-15 DIAGNOSIS — I110 Hypertensive heart disease with heart failure: Secondary | ICD-10-CM | POA: Diagnosis not present

## 2016-02-15 DIAGNOSIS — E1140 Type 2 diabetes mellitus with diabetic neuropathy, unspecified: Secondary | ICD-10-CM | POA: Diagnosis not present

## 2016-02-15 DIAGNOSIS — Z7984 Long term (current) use of oral hypoglycemic drugs: Secondary | ICD-10-CM | POA: Diagnosis not present

## 2016-02-15 DIAGNOSIS — E785 Hyperlipidemia, unspecified: Secondary | ICD-10-CM | POA: Diagnosis not present

## 2016-02-15 NOTE — Telephone Encounter (Signed)
02/15/16 called to just see if he could come in at 90 and he said he couldn't.... He has to unwrap, get in the shower, and then wrap and he can't do all that and get here by that time.  We rescheduled for Friday

## 2016-02-16 ENCOUNTER — Other Ambulatory Visit: Payer: Self-pay | Admitting: *Deleted

## 2016-02-16 ENCOUNTER — Ambulatory Visit: Payer: Self-pay | Admitting: *Deleted

## 2016-02-16 ENCOUNTER — Ambulatory Visit (HOSPITAL_COMMUNITY): Payer: Medicare Other | Admitting: Physical Therapy

## 2016-02-16 DIAGNOSIS — M25562 Pain in left knee: Secondary | ICD-10-CM | POA: Diagnosis not present

## 2016-02-16 DIAGNOSIS — M6281 Muscle weakness (generalized): Secondary | ICD-10-CM | POA: Diagnosis not present

## 2016-02-16 DIAGNOSIS — R262 Difficulty in walking, not elsewhere classified: Secondary | ICD-10-CM | POA: Diagnosis not present

## 2016-02-16 DIAGNOSIS — M25561 Pain in right knee: Secondary | ICD-10-CM | POA: Diagnosis not present

## 2016-02-16 DIAGNOSIS — I890 Lymphedema, not elsewhere classified: Secondary | ICD-10-CM | POA: Diagnosis not present

## 2016-02-16 DIAGNOSIS — R2681 Unsteadiness on feet: Secondary | ICD-10-CM

## 2016-02-16 NOTE — Therapy (Addendum)
[place], [place], [address] Phone: [phone]   Fax:  [phone]  Physical Therapy Treatment  Patient Details  Name: Michael [name] MRN: [id number] Date of Birth: January 18, 1946 Referring Provider: Dr. Vic Blackbird  Encounter Date: 02/16/2016      PT End of Session - 02/16/16 1229    Visit Number 6   Number of Visits 18   Date for PT Re-Evaluation 02/24/16   Authorization Type UHC medicare    Authorization - Visit Number 6   Authorization - Number of Visits 18   PT Start Time [phone]   PT Stop Time 1000   PT Time Calculation (min) 70 min   Activity Tolerance Patient tolerated treatment well   Behavior During Therapy [facility] for tasks assessed/performed      Past Medical History  Diagnosis Date  . COPD (chronic obstructive pulmonary disease) ([place])   . Type 2 diabetes mellitus ([place])   . Hyperlipidemia   . Essential hypertension   . Iron deficiency anemia   . NASH (nonalcoholic steatohepatitis)   . Diabetic neuropathy ([place])   . GERD (gastroesophageal reflux disease)   . Arthritis   . Depression   . Diastolic dysfunction   . Left knee DJD   . Lumbar spinal stenosis   . DDD (degenerative disc disease), lumbosacral   . Peripheral edema     Past Surgical History  Procedure Laterality Date  . Hernia repair    . Shoulder surgery      Rght-rotator cuff  . Lipoma removal      Stomach  . Knee arthroscopy with medial menisectomy Left 11/06/2012    Procedure: KNEE ARTHROSCOPY WITH MEDIAL MENISECTOMY;  Surgeon: Carole Civil, MD;  Location: [facility];  Service: Orthopedics;  Laterality: Left;  . Lumbar laminectomy/decompression microdiscectomy Left 08/30/2013    Procedure: LUMBAR LAMINECTOMY/DECOMPRESSION MICRODISCECTOMY LEFT  LUMBAR TWO THREE;  Surgeon: Otilio Connors, MD;  Location: [place] NEURO ORS;  Service: Neurosurgery;  Laterality: Left;  . Egd with enteroscopy  2011    [facility]: normal esophagus and  stomach. Normal duodenum, jejunum. No evidence of AVMs.   . Colonoscopy  2011    [facility]: normal colon, normal distal ileum  . Colonoscopy with propofol N/A 07/21/2014    Procedure: ATTEMPTED COLONOSCOPY WITH PROPOFOL-HAD TO STOP DUE TO BRADYCARDIA;  Surgeon: Daneil Dolin, MD;  Location: [facility];  Service: Endoscopy;  Laterality: N/A;  . Esophagogastroduodenoscopy (egd) with propofol N/A 07/21/2014    Procedure: ESOPHAGOGASTRODUODENOSCOPY (EGD) WITH PROPOFOL;  Surgeon: Daneil Dolin, MD;  Location: [facility];  Service: Endoscopy;  Laterality: N/A;  . Biopsy N/A 07/21/2014    Procedure: BIOPSY;  Surgeon: Daneil Dolin, MD;  Location: [facility];  Service: Endoscopy;  Laterality: N/A;    There were no vitals filed for this visit.      Subjective Assessment - 02/16/16 1226    Subjective Pt comes today with all of his bandages.  States that he is eager to get his compresssion garment.  Michael [name] states that he is receiving Home health  services.  I educated Michael [name] that his insurance will not pay for outpatient therapy if he is receiving home health as well    Pertinent History CHF, DM, HTN,    How long can you sit comfortably? no problem    How long can you stand comfortably? unable    How long can you walk comfortably? unable    Currently  in Pain? No/denies                         [facility] Adult PT Treatment/Exercise - 02/16/16 0001    Manual Therapy   Manual Therapy Manual Lymphatic Drainage (MLD);Compression Bandaging   Manual therapy comments completed separate prior to bandaging   Manual Lymphatic Drainage (MLD) Completd to toes and forefoot of B LE for extensive time.    Compression Bandaging foam cut for Lt LE, Pt dressed with multilayer short stretch bandages and foam to B LE including toe wraps.                 PT Education - 02/16/16 1229    Education provided Yes   Education Details To take bandages off if he has any increase pain, not to scratch  area.           PT Short Term Goals - 02/12/16 1117    PT SHORT TERM GOAL #1   Title Pt will have a  5 cm reduction in bilateral  for improved fitting of clothing    Time 3   Period Weeks   Status Achieved   PT SHORT TERM GOAL #2   Title Pt will be able to identify the signs and symptoms of cellulitis due to prolong lymphstasis   Time 1   Period Weeks   Status On-going   PT SHORT TERM GOAL #3   Title Pt will have a 5 cm decrease at dorsum of foot to improve ability to don shoes    Time 3   Period Weeks   Status On-going   PT SHORT TERM GOAL #4   Title Pt will be I in remedial exercises to promote lymph circulaiton    Time 2   Period Weeks   Status On-going           PT Long Term Goals - 02/12/16 1117    PT LONG TERM GOAL #1   Title Pt will have a 7cm  reduction in fluid in bilateral LE  to promote independence of transfers. Pt to be able to transfer to bed and be able to lift his feet onto the bed on his own.    Time 6   Period Weeks   Status On-going   PT LONG TERM GOAL #2   Title Pt to have acquired compression garment for bilateral  LE to allow maintance phase of treatment to begin    Time 6   Period Weeks   Status On-going   PT LONG TERM GOAL #3   Title Pt to have no drainage from either LE to reduce risk of infection .   Time 3   Period Weeks   Status On-going   PT LONG TERM GOAL #4   Title Pt life impact to decrease by at least 15 points to show improved physical/functional concerns    Time 6   Period Weeks   Status On-going               Plan - 02/16/16 1230    Clinical Impression Statement Pt comes to department with legs unwrapped.  Pt Lt LE appears to have what looks like scratch marks on the anterior aspect of his leg.  His leg is purple from 5" inferior to his knee to 2" superior to his ankle throughout the anterior aspect.  B Feet have significant toe swelling as well asn dorsal humps at forefoot.  Extended period of time  spent at these  areas.    PT Treatment/Interventions ADLs/Self Care Home Management;Compression bandaging;Manual techniques;Manual lymph drainage;Therapeutic exercise;Balance training;Therapeutic activities   PT Next Visit Plan Assess Lt LE; continue with complete decongestive techniques with increased time spent on manual at toes and forefoot. Find out which home health service is providing services for Michael [name] to see if they agree to pay for therapy visits.       Patient will benefit from skilled therapeutic intervention in order to improve the following deficits and impairments:  Pain, Increased edema, Decreased activity tolerance, Decreased mobility, Decreased range of motion  Visit Diagnosis: Lymphedema, not elsewhere classified  Difficulty in walking, not elsewhere classified  Unsteadiness on feet     Problem List Patient Active Problem List   Diagnosis Date Noted  . Peripheral edema 10/05/2015  . Diastolic CHF ([place]) 60/67/7034  . Diastolic dysfunction 03/52/4818  . Stage II pressure ulcer of sacral region 02/13/2015  . Atypical nevi 01/18/2015  . Intertriginous candidiasis 12/23/2014  . Hypothyroidism 11/30/2014  . Bradycardia 07/21/2014  . Sinus pause 07/21/2014  . Liver fibrosis ([place]) 06/29/2014  . IDA (iron deficiency anemia) 06/29/2014  . Iron deficiency anemia 05/31/2014  . Hepatomegaly 05/25/2014  . Difficulty walking 05/03/2014  . Knee pain, chronic 05/03/2014  . Stiffness of joint, not elsewhere classified, pelvic region and thigh 05/03/2014  . Dermatitis 04/12/2014  . HNP (herniated nucleus pulposus), lumbar 08/30/2013  . Spinal stenosis of lumbar region 07/06/2013  . Carpal tunnel syndrome 04/13/2013  . Lymphedema 04/13/2013  . Status post arthroscopy of left knee 04/13/2013  . DDD (degenerative disc disease), lumbosacral 04/13/2013  . Peripheral neuropathy ([place]) 01/05/2013  . S/P arthroscopy of left knee 11/09/2012  . Osteoarthritis of left knee 10/27/2012  .  Meniscus tear 10/07/2012  . Patellar tendonitis 07/01/2012  . NASH (nonalcoholic steatohepatitis) 05/22/2007  . Type II diabetes mellitus with neurological manifestations ([place]) 05/20/2007  . Hyperlipidemia 10/15/2006  . OBESITY, MORBID 10/15/2006  . Essential hypertension 10/15/2006  . GERD 10/15/2006   Rayetta Humphrey, PT CLT [phone] 02/16/2016, 12:35 PM  [facility] [address] [place], [place], [address] Phone: [phone]   Fax:  [phone]  Name: SUSAN ARANA MRN: [id number] Date of Birth: 06-Dec-1945

## 2016-02-16 NOTE — Patient Outreach (Signed)
[facility]) Care Management   02/16/2016  HAU SANOR 30-Mar-1946 [id number]  Michael [name] is an 69 y.o. male with chronic diastolic heart failure with EF 60-65%, history of bradycardia, type II Diabetes, Hypertension, Iron Deficiency Anemia, NASH + Hepatomegaly, and degeneratove disc disease s/p surgery of L2-L3.   Michael [name] was admitted to the hospital in February with Acute/Chronic Diastolic Heart Failure and worsening lymphedema. Thereafter, he spent 21 days at [facility]. Michael [name] was referred to [facility] for assistance with management of CHF.   [facility] Care Management has been following Michael [name] in the community since his discharge from SNF. Focus of care management services has been around CHF and DM Disease management with particular attention to volume management and peripheral edema/lymphedema and lower extremity skin ailments.    Subjective: "My legs are doing so much better. They feel better and the swelling is coming down."   Objective:  BP 112/64 mmHg  Pulse 71  Wt 204 lb (92.534 kg)  SpO2 98%  Review of Systems  Constitutional: Negative.   HENT: Negative.   Eyes: Negative.   Respiratory: Negative for cough, sputum production, shortness of breath and wheezing.   Cardiovascular: Positive for leg swelling. Negative for chest pain, palpitations and orthopnea.  Gastrointestinal: Negative for heartburn, nausea, vomiting, abdominal pain, diarrhea, constipation and blood in stool.  Genitourinary: Negative.   Musculoskeletal: Positive for myalgias. Negative for falls.  Skin:       Bilateral lower extremity leg wraps in place; see descriptive note by physical therapy addressing skin condition  Neurological: Negative.   Psychiatric/Behavioral: Negative.      Physical Exam  Constitutional: He is oriented to person, place, and time. Vital signs are normal. He appears well-developed and well-nourished. He is active.  Cardiovascular: Normal rate,  regular rhythm, S1 normal and S2 normal.  Exam reveals no gallop.   No murmur heard. Respiratory: Effort normal and breath sounds normal. He has no wheezes. He has no rhonchi. He has no rales.  GI: Soft. Bowel sounds are normal.  Neurological: He is alert and oriented to person, place, and time.  Skin: Skin is warm and dry. Rash noted.     See PT notes for skin condition assessment  Psychiatric: He has a normal mood and affect. His speech is normal and behavior is normal. Judgment and thought content normal. Cognition and memory are normal.    Encounter Medications:   Outpatient Encounter Prescriptions as of 02/16/2016  Medication Sig  . ACCU-CHEK AVIVA PLUS test strip USE AS DIRECTED TWICE DAILY.  Marland Kitchen aspirin EC 81 MG tablet Take 81 mg by mouth daily.  . B-D ULTRAFINE III SHORT PEN 31G X 8 MM MISC USE AS DIRECTED DAILY WITH LANTUS.  Marland Kitchen benazepril (LOTENSIN) 40 MG tablet TAKE ONE TABLET BY MOUTH DAILY.  . bumetanide (BUMEX) 2 MG tablet Take 1.5 tablets (3 mg total) by mouth 2 (two) times daily.  Marland Kitchen BYSTOLIC 10 MG tablet TAKE ONE TABLET BY MOUTH DAILY.  . clobetasol cream (TEMOVATE) 0.05 % APPLY TO AFFECTED AREA 2 TIMES DAILY.  . diazepam (VALIUM) 5 MG tablet TAKE 1 TABLET BY MOUTH AT BEDTIME AS NEEDED FOR ANXIETY.  . diclofenac sodium (VOLTAREN) 1 % GEL Apply 4 g topically 4 (four) times daily.  . Ferrous Sulfate (IRON) 325 (65 FE) MG TABS Take 1 tablet by mouth daily.   Marland Kitchen gabapentin (NEURONTIN) 400 MG capsule TAKE ONE CAPSULE BY MOUTH THREE TIMES DAILY.  Marland Kitchen LANTUS SOLOSTAR  100 UNIT/ML Solostar Pen INJECT 30 UNITS SUBCUTANEOUSLY AT BEDTIME.  Marland Kitchen levothyroxine (SYNTHROID, LEVOTHROID) 88 MCG tablet Take 1 tablet (88 mcg total) by mouth daily. Take 1 tab PO Q AM 30 minutes prior to eating or taking any other medications.  . Magnesium 400 MG TABS Take 400 mg by mouth 2 (two) times daily.  . meloxicam (MOBIC) 15 MG tablet TAKE ONE TABLET BY MOUTH DAILY WITH FOOD.  Marland Kitchen metFORMIN (GLUCOPHAGE) 1000 MG  tablet TAKE ONE TABLET BY MOUTH TWICE DAILY WITH A MEAL.  Marland Kitchen metolazone (ZAROXOLYN) 2.5 MG tablet Take 1 tablet (2.5 mg total) by mouth daily.  . mupirocin cream (BACTROBAN) 2 % Apply 1 application topically 2 (two) times daily. To legs as needed  . mupirocin ointment (BACTROBAN) 2 % APPLY 1 APPLICATION TO THE LEGS 3 TIMES A DAY  . nystatin cream (MYCOSTATIN) Apply 1 application topically 2 (two) times daily.  Marland Kitchen omeprazole (PRILOSEC) 20 MG capsule TAKE ONE CAPSULE BY MOUTH TWICE DAILY.  Marland Kitchen oxyCODONE-acetaminophen (PERCOCET) 7.5-325 MG tablet Take one tablet by mouth every 6 hours as needed for severe pain. Max APAP 3gm/24hrs from all sources  . potassium chloride (K-DUR) 10 MEQ tablet Take 2 tablets in the AM / Take 1 tablet in the PM (Patient taking differently: 20 mEq daily. Take 2 tablets in the AM / Take 1 tablet in the PM)  . pravastatin (PRAVACHOL) 40 MG tablet TAKE ONE TABLET BY MOUTH DAILY.   Assessment:  70 year old gentleman living in [place] with CHF, DM, lower extremity lymphedema.    Chronic Health Condition (CHF)- Michael [name] weight on discharge from acute care setting was 195#. His weight quickly went up to as high as 231# after discharge. He is on an aggressive diuretic regimen and outpatient lymphedema treatment which has helped. Michael [name] weight is down to 200# today. He denies shortness of breath, chest pain, worsened fatigue, or any symptoms. Michael [name] is taking diuretics and all other medications as prescribed. He is seeing his cardiologist as scheduled.   Acute Health Condition (BLE lymphedema) - Michael [name] lymphedema is improving with outpatient treatment in the physical therapy department at [facility]. Michael [name] is attending therapy twice weekly.  Acute Health Condition (interruption in skin integrity inferior gluteal folds) - Michael [name] developed ulcers of the peri-anal skin, in the inferior gluteal folds, presumably from sitting for extended periods  of time because of his severe lymphedema and lower extremity swelling. He has a home health nurse coming weekly to check his skin and change dressings. One of his (Duoderm) dressings was rolled back today and he was bleeding from one of his skin tears. I gently removed the dressing, cleansed and dried the area, and applied a new dressing.  The exposed wounds appeared superficial (Stage 1).  Chronic Health Condition (DM) - Michael [name] admittedly has not been as rigorous in his attention to Diabetes Management since he began having so much trouble with his legs. His most recent 30 day average  (02/16/16) was still 156. He is taking medications as prescribed and checking his cbg's as recommended. His plan is to regroup on Diabetes management and work hard to keep his cbg's in the recommended ranges.   Plan:   Michael [name] will continue twice weekly visits to Outpatient PT for lymphedema treatment.   Michael [name] will continue to receive weekly [facility] visits for ongoing assessment of his gluteal ulcers.   Michael [name] will attend all scheduled provider appointments.  Mr. Bitting will take all medications as prescribed and will check cbg's daily, recording and calling for findings outside established parameters.   Michael [name] weigh himself daily and record, calling for weight gain of 3# overnight or 5# in a week, increased fatigue/swelling, or shortness of breath/chest pain, or any other new or worsened symptom.   I will follow up with Michael [name] by phone next week.    [facility]  [phone]

## 2016-02-19 ENCOUNTER — Ambulatory Visit (HOSPITAL_COMMUNITY): Payer: Medicare Other | Admitting: Physical Therapy

## 2016-02-19 ENCOUNTER — Ambulatory Visit (INDEPENDENT_AMBULATORY_CARE_PROVIDER_SITE_OTHER): Payer: Medicare Other | Admitting: Cardiology

## 2016-02-19 ENCOUNTER — Other Ambulatory Visit: Payer: Self-pay | Admitting: Family Medicine

## 2016-02-19 ENCOUNTER — Encounter: Payer: Self-pay | Admitting: Cardiology

## 2016-02-19 DIAGNOSIS — M25561 Pain in right knee: Secondary | ICD-10-CM | POA: Diagnosis not present

## 2016-02-19 DIAGNOSIS — R2681 Unsteadiness on feet: Secondary | ICD-10-CM

## 2016-02-19 DIAGNOSIS — I890 Lymphedema, not elsewhere classified: Secondary | ICD-10-CM

## 2016-02-19 DIAGNOSIS — I10 Essential (primary) hypertension: Secondary | ICD-10-CM

## 2016-02-19 DIAGNOSIS — I5032 Chronic diastolic (congestive) heart failure: Secondary | ICD-10-CM

## 2016-02-19 DIAGNOSIS — M6281 Muscle weakness (generalized): Secondary | ICD-10-CM | POA: Diagnosis not present

## 2016-02-19 DIAGNOSIS — S81802A Unspecified open wound, left lower leg, initial encounter: Secondary | ICD-10-CM | POA: Diagnosis not present

## 2016-02-19 DIAGNOSIS — M25562 Pain in left knee: Secondary | ICD-10-CM

## 2016-02-19 DIAGNOSIS — R262 Difficulty in walking, not elsewhere classified: Secondary | ICD-10-CM

## 2016-02-19 NOTE — Therapy (Signed)
[place], [place], [address] Phone: [phone]   Fax:  [phone]  Physical Therapy Treatment  Patient Details  Name: Michael [name] MRN: [id number] Date of Birth: [birth year] Referring Provider: Dr. Vic Blackbird  Encounter Date: 02/19/2016      PT End of Session - 02/19/16 1257    Visit Number 7   Number of Visits 18   Date for PT Re-Evaluation 02/24/16   Authorization Type [employer] medicare    Authorization - Visit Number 7   Authorization - Number of Visits 18   PT Start Time 7619   PT Stop Time 1150   PT Time Calculation (min) 70 min   Activity Tolerance Patient tolerated treatment well   Behavior During Therapy [facility] for tasks assessed/performed      Past Medical History  Diagnosis Date  . COPD (chronic obstructive pulmonary disease) ([place])   . Type 2 diabetes mellitus ([place])   . Hyperlipidemia   . Essential hypertension   . Iron deficiency anemia   . NASH (nonalcoholic steatohepatitis)   . Diabetic neuropathy ([place])   . GERD (gastroesophageal reflux disease)   . Arthritis   . Depression   . Diastolic dysfunction   . Left knee DJD   . Lumbar spinal stenosis   . DDD (degenerative disc disease), lumbosacral   . Peripheral edema     Past Surgical History  Procedure Laterality Date  . Hernia repair    . Shoulder surgery      Rght-rotator cuff  . Lipoma removal      Stomach  . Knee arthroscopy with medial menisectomy Left 11/06/2012    Procedure: KNEE ARTHROSCOPY WITH MEDIAL MENISECTOMY;  Surgeon: Carole Civil, MD;  Location: [facility];  Service: Orthopedics;  Laterality: Left;  . Lumbar laminectomy/decompression microdiscectomy Left 08/30/2013    Procedure: LUMBAR LAMINECTOMY/DECOMPRESSION MICRODISCECTOMY LEFT  LUMBAR TWO THREE;  Surgeon: Otilio Connors, MD;  Location: [facility];  Service: Neurosurgery;  Laterality: Left;  . Egd with enteroscopy  2011    [facility]: normal esophagus and  stomach. Normal duodenum, jejunum. No evidence of AVMs.   . Colonoscopy  2011    [facility]: normal colon, normal distal ileum  . Colonoscopy with propofol N/A 07/21/2014    Procedure: ATTEMPTED COLONOSCOPY WITH PROPOFOL-HAD TO STOP DUE TO BRADYCARDIA;  Surgeon: Daneil Dolin, MD;  Location: [facility];  Service: Endoscopy;  Laterality: N/A;  . Esophagogastroduodenoscopy (egd) with propofol N/A 07/21/2014    Procedure: ESOPHAGOGASTRODUODENOSCOPY (EGD) WITH PROPOFOL;  Surgeon: Daneil Dolin, MD;  Location: [facility];  Service: Endoscopy;  Laterality: N/A;  . Biopsy N/A 07/21/2014    Procedure: BIOPSY;  Surgeon: Daneil Dolin, MD;  Location: [facility];  Service: Endoscopy;  Laterality: N/A;    There were no vitals filed for this visit.      Subjective Assessment - 02/19/16 1256    Subjective Pt states the toe bandages hurt and cut into his toes.  Requests we not use these anymore.  Pt reports he is aware of needing to be here at [facility] on Wednesday to get measured.   Currently in Pain? No/denies                         [facility] Adult PT Treatment/Exercise - 02/19/16 0001    Manual Therapy   Manual Therapy Manual Lymphatic Drainage (MLD);Compression Bandaging   Manual therapy comments completed separate prior to  bandaging   Manual Lymphatic Drainage (MLD) Completed for bilateral LE's with inguinal axillary anastomosis   Compression Bandaging multilayer short stretch bandages and foam to B LE including toe wraps.                   PT Short Term Goals - 02/12/16 1117    PT SHORT TERM GOAL #1   Title Pt will have a  5 cm reduction in bilateral  for improved fitting of clothing    Time 3   Period Weeks   Status Achieved   PT SHORT TERM GOAL #2   Title Pt will be able to identify the signs and symptoms of cellulitis due to prolong lymphstasis   Time 1   Period Weeks   Status On-going   PT SHORT TERM GOAL #3   Title Pt will have a 5 cm decrease at dorsum of foot to  improve ability to don shoes    Time 3   Period Weeks   Status On-going   PT SHORT TERM GOAL #4   Title Pt will be I in remedial exercises to promote lymph circulaiton    Time 2   Period Weeks   Status On-going           PT Long Term Goals - 02/12/16 1117    PT LONG TERM GOAL #1   Title Pt will have a 7cm  reduction in fluid in bilateral LE  to promote independence of transfers. Pt to be able to transfer to bed and be able to lift his feet onto the bed on his own.    Time 6   Period Weeks   Status On-going   PT LONG TERM GOAL #2   Title Pt to have acquired compression garment for bilateral  LE to allow maintance phase of treatment to begin    Time 6   Period Weeks   Status On-going   PT LONG TERM GOAL #3   Title Pt to have no drainage from either LE to reduce risk of infection .   Time 3   Period Weeks   Status On-going   PT LONG TERM GOAL #4   Title Pt life impact to decrease by at least 15 points to show improved physical/functional concerns    Time 6   Period Weeks   Status On-going               Plan - 02/19/16 1258    Clinical Impression Statement Pt just removed bandages this am and took bath prior to coming.  Manual lymph drainage completed to bilateral LE's with moistuizing prior to re-bandaging.  No signs of sores or uneven compression on either LE or toes from bandaging.  Pt reminded the importance of elevating LE"s, returning to bed rather than recliner to assist with this.  Encoruaged to continue LE strengthening and standing actvities at home.  Pt unable to get evaluated for LE strengthening yet due to scheduling.  Pt reported comfort of bandaging after.    PT Treatment/Interventions ADLs/Self Care Home Management;Compression bandaging;Manual techniques;Manual lymph drainage;Therapeutic exercise;Balance training;Therapeutic activities   PT Next Visit Plan Assess Lt LE; continue with complete decongestive techniques with increased time spent on manual at  toes and forefoot.       Patient will benefit from skilled therapeutic intervention in order to improve the following deficits and impairments:  Pain, Increased edema, Decreased activity tolerance, Decreased mobility, Decreased range of motion  Visit Diagnosis: Lymphedema, not elsewhere classified  Difficulty in walking, not elsewhere classified  Unsteadiness on feet  Arthralgia of both lower legs     Problem List Patient Active Problem List   Diagnosis Date Noted  . Peripheral edema 10/05/2015  . Diastolic CHF ([place]) 49/75/3005  . Diastolic dysfunction 08/01/1116  . Stage II pressure ulcer of sacral region 02/13/2015  . Atypical nevi 01/18/2015  . Intertriginous candidiasis 12/23/2014  . Hypothyroidism 11/30/2014  . Bradycardia 07/21/2014  . Sinus pause 07/21/2014  . Liver fibrosis ([place]) 06/29/2014  . IDA (iron deficiency anemia) 06/29/2014  . Iron deficiency anemia 05/31/2014  . Hepatomegaly 05/25/2014  . Difficulty walking 05/03/2014  . Knee pain, chronic 05/03/2014  . Stiffness of joint, not elsewhere classified, pelvic region and thigh 05/03/2014  . Dermatitis 04/12/2014  . HNP (herniated nucleus pulposus), lumbar 08/30/2013  . Spinal stenosis of lumbar region 07/06/2013  . Carpal tunnel syndrome 04/13/2013  . Lymphedema 04/13/2013  . Status post arthroscopy of left knee 04/13/2013  . DDD (degenerative disc disease), lumbosacral 04/13/2013  . Peripheral neuropathy ([place]) 01/05/2013  . S/P arthroscopy of left knee 11/09/2012  . Osteoarthritis of left knee 10/27/2012  . Meniscus tear 10/07/2012  . Patellar tendonitis 07/01/2012  . NASH (nonalcoholic steatohepatitis) 05/22/2007  . Type II diabetes mellitus with neurological manifestations ([place]) 05/20/2007  . Hyperlipidemia 10/15/2006  . OBESITY, MORBID 10/15/2006  . Essential hypertension 10/15/2006  . GERD 10/15/2006    Teena Irani, PTA/CLT [phone]  02/19/2016, 1:02 PM  [place] [address] [place], [place], [address] Phone: [phone]   Fax:  [phone]  Name: Michael [name] MRN: [id number] Date of Birth: 25-Nov-1945

## 2016-02-19 NOTE — Progress Notes (Signed)
Patient ID: Michael [name], male   DOB: [birth year], 70 y.o.   MRN: [id number]     Clinical Summary [name] is a 70 y.o.male seen today for follow up of the following medical problems.   1. Chronic diastolic heart failure - echo 05/2015 with normal LVEF 60-65%, grade II diastolic dysfunction - Jan 2017 echo LVEF 29-92%, grade I diastolic dysfunction - Weight up 195 to 211 since hospital discharge. - edema likely combined diastolic HF and lymphedema   - he has required aggressive diruetic regimen. Recently bumex increased to 69m bid, he continued to swell on this regimen.   Added metolazone 2.565mdaily. Since this time swelling is improving, weight down to 200 lbs. He is also receiving PT treatments for his lymphedema.     Past Medical History  Diagnosis Date  . COPD (chronic obstructive pulmonary disease) ([facility]  . Type 2 diabetes mellitus ([facility]  . Hyperlipidemia   . Essential hypertension   . Iron deficiency anemia   . NASH (nonalcoholic steatohepatitis)   . Diabetic neuropathy ([facility]  . GERD (gastroesophageal reflux disease)   . Arthritis   . Depression   . Diastolic dysfunction   . Left knee DJD   . Lumbar spinal stenosis   . DDD (degenerative disc disease), lumbosacral   . Peripheral edema      No Known Allergies   Current Outpatient Prescriptions  Medication Sig Dispense Refill  . ACCU-CHEK AVIVA PLUS test strip USE AS DIRECTED TWICE DAILY. 100 each 0  . aspirin EC 81 MG tablet Take 81 mg by mouth daily.    . B-D ULTRAFINE III SHORT PEN 31G X 8 MM MISC USE AS DIRECTED DAILY WITH LANTUS. 100 each 0  . benazepril (LOTENSIN) 40 MG tablet TAKE ONE TABLET BY MOUTH DAILY. 90 tablet 0  . bumetanide (BUMEX) 2 MG tablet Take 1.5 tablets (3 mg total) by mouth 2 (two) times daily. 135 tablet 3  . BYSTOLIC 10 MG tablet TAKE ONE TABLET BY MOUTH DAILY. 30 tablet 6  . clobetasol cream (TEMOVATE) 0.05 % APPLY TO AFFECTED AREA 2 TIMES DAILY. 45 g 11  . diazepam (VALIUM) 5 MG  tablet TAKE 1 TABLET BY MOUTH AT BEDTIME AS NEEDED FOR ANXIETY. 30 tablet 0  . diclofenac sodium (VOLTAREN) 1 % GEL Apply 4 g topically 4 (four) times daily. 5 Tube 5  . Ferrous Sulfate (IRON) 325 (65 FE) MG TABS Take 1 tablet by mouth daily.     . Marland Kitchenabapentin (NEURONTIN) 400 MG capsule TAKE ONE CAPSULE BY MOUTH THREE TIMES DAILY. 90 capsule 3  . LANTUS SOLOSTAR 100 UNIT/ML Solostar Pen INJECT 30 UNITS SUBCUTANEOUSLY AT BEDTIME. 15 mL 3  . levothyroxine (SYNTHROID, LEVOTHROID) 88 MCG tablet Take 1 tablet (88 mcg total) by mouth daily. Take 1 tab PO Q AM 30 minutes prior to eating or taking any other medications. 30 tablet 3  . Magnesium 400 MG TABS Take 400 mg by mouth 2 (two) times daily. 60 tablet 6  . meloxicam (MOBIC) 15 MG tablet TAKE ONE TABLET BY MOUTH DAILY WITH FOOD. 30 tablet 3  . metFORMIN (GLUCOPHAGE) 1000 MG tablet TAKE ONE TABLET BY MOUTH TWICE DAILY WITH A MEAL. 180 tablet 3  . metolazone (ZAROXOLYN) 2.5 MG tablet Take 1 tablet (2.5 mg total) by mouth daily. 30 tablet 3  . mupirocin cream (BACTROBAN) 2 % Apply 1 application topically 2 (two) times daily. To legs as needed 30 g 0  . mupirocin ointment (  BACTROBAN) 2 % APPLY 1 APPLICATION TO THE LEGS 3 TIMES A DAY 22 g 2  . nystatin cream (MYCOSTATIN) Apply 1 application topically 2 (two) times daily. 45 g 1  . omeprazole (PRILOSEC) 20 MG capsule TAKE ONE CAPSULE BY MOUTH TWICE DAILY. 60 capsule 11  . oxyCODONE-acetaminophen (PERCOCET) 7.5-325 MG tablet Take one tablet by mouth every 6 hours as needed for severe pain. Max APAP 3gm/24hrs from all sources 120 tablet 0  . potassium chloride (K-DUR) 10 MEQ tablet Take 2 tablets in the AM / Take 1 tablet in the PM (Patient taking differently: 20 mEq daily. Take 2 tablets in the AM / Take 1 tablet in the PM) 90 tablet 3  . pravastatin (PRAVACHOL) 40 MG tablet TAKE ONE TABLET BY MOUTH DAILY. 90 tablet 0   No current facility-administered medications for this visit.     Past Surgical History   Procedure Laterality Date  . Hernia repair    . Shoulder surgery      Rght-rotator cuff  . Lipoma removal      Stomach  . Knee arthroscopy with medial menisectomy Left 11/06/2012    Procedure: KNEE ARTHROSCOPY WITH MEDIAL MENISECTOMY;  Surgeon: Carole Civil, MD;  Location: [facility];  Service: Orthopedics;  Laterality: Left;  . Lumbar laminectomy/decompression microdiscectomy Left 08/30/2013    Procedure: LUMBAR LAMINECTOMY/DECOMPRESSION MICRODISCECTOMY LEFT  LUMBAR TWO THREE;  Surgeon: Otilio Connors, MD;  Location: [place] [facility];  Service: Neurosurgery;  Laterality: Left;  . Egd with enteroscopy  2011    [facility]: normal esophagus and stomach. Normal duodenum, jejunum. No evidence of AVMs.   . Colonoscopy  2011    [facility]: normal colon, normal distal ileum  . Colonoscopy with propofol N/A 07/21/2014    Procedure: ATTEMPTED COLONOSCOPY WITH PROPOFOL-HAD TO STOP DUE TO BRADYCARDIA;  Surgeon: Daneil Dolin, MD;  Location: [facility];  Service: Endoscopy;  Laterality: N/A;  . Esophagogastroduodenoscopy (egd) with propofol N/A 07/21/2014    Procedure: ESOPHAGOGASTRODUODENOSCOPY (EGD) WITH PROPOFOL;  Surgeon: Daneil Dolin, MD;  Location: [facility];  Service: Endoscopy;  Laterality: N/A;  . Biopsy N/A 07/21/2014    Procedure: BIOPSY;  Surgeon: Daneil Dolin, MD;  Location: [facility];  Service: Endoscopy;  Laterality: N/A;     No Known Allergies    Family History  Problem Relation Age of Onset  . Heart disease Mother   . Hyperlipidemia Mother   . Hypertension Mother   . Depression Mother   . Diabetes Mother   . Rectal cancer Mother   . Cancer Mother   . Heart disease Father   . Hypertension Father   . Hyperlipidemia Father   . Diabetes Father   . Cancer Father   . Heart disease Sister   . Hyperlipidemia Sister   . Hypertension Sister   . Diabetes Sister   . Diabetes Brother   . Heart disease Sister   . Hyperlipidemia Sister   . Hypertension Sister   . Heart disease Sister     . Hyperlipidemia Sister   . Hypertension Sister   . Diabetes Sister   . Diabetes Brother      Social History [name] reports that he quit smoking about 16 years ago. His smoking use included Cigarettes. He started smoking about 60 years ago. He has a 110 pack-year smoking history. He has never used smokeless tobacco. Mr. Diodato reports that he does not drink alcohol.   Review of Systems CONSTITUTIONAL: No weight loss, fever,  chills, weakness or fatigue.  HEENT: Eyes: No visual loss, blurred vision, double vision or yellow sclerae.No hearing loss, sneezing, congestion, runny nose or sore throat.  SKIN: No rash or itching.  CARDIOVASCULAR: no chest pain ,no palpitations RESPIRATORY: No shortness of breath, cough or sputum.  GASTROINTESTINAL: No anorexia, nausea, vomiting or diarrhea. No abdominal pain or blood.  GENITOURINARY: No burning on urination, no polyuria NEUROLOGICAL: No headache, dizziness, syncope, paralysis, ataxia, numbness or tingling in the extremities. No change in bowel or bladder control.  MUSCULOSKELETAL: No muscle, back pain, joint pain or stiffness.  LYMPHATICS: No enlarged nodes. No history of splenectomy.  PSYCHIATRIC: No history of depression or anxiety.  ENDOCRINOLOGIC: No reports of sweating, cold or heat intolerance. No polyuria or polydipsia.  Marland Kitchen   Physical Examination Filed Vitals:   02/19/16 1352  BP: 117/70  Pulse: 79   Filed Vitals:   02/19/16 1352  Height: 5' (1.524 m)  Weight: 200 lb (90.719 kg)    Gen: resting comfortably, no acute distress HEENT: no scleral icterus, pupils equal round and reactive, no palptable cervical adenopathy,  CV: RRR, no m/r/g, no jvd Resp: Clear to auscultation bilaterally GI: abdomen is soft, non-tender, non-distended, normal bowel sounds, no hepatosplenomegaly MSK: extremities are warm, 2+ bilateral LE edema Skin: warm, no rash Neuro:  no focal deficits Psych: appropriate affect   Diagnostic  Studies 05/2015 echo Study Conclusions  - Left ventricle: The cavity size was normal. Wall thickness was increased in a pattern of mild LVH. Systolic function was normal. The estimated ejection fraction was in the range of 60% to 65%. Wall motion was normal; there were no regional wall motion abnormalities. Features are consistent with a pseudonormal left ventricular filling pattern, with concomitant abnormal relaxation and increased filling pressure (grade 2 diastolic dysfunction). Doppler parameters are consistent with high ventricular filling pressure. - Aortic valve: Mildly calcified annulus. - Mitral valve: Mildly calcified annulus. There was trivial regurgitation. - Left atrium: The atrium was mildly to moderately dilated. Volume/bsa, S: 34.4 ml/m^2. Volume/bsa, ES (1-plane Simpson&'s, A4C): 31.3 ml/m^2.  Jan 2017 Study Conclusions  - Left ventricle: The cavity size was mildly dilated. Wall thickness was increased in a pattern of mild LVH. Systolic function was normal. The estimated ejection fraction was in the range of 50% to 55%. Wall motion was normal; there were no regional wall motion abnormalities. Doppler parameters are consistent with abnormal left ventricular relaxation (grade 1 diastolic dysfunction). - Mitral valve: Calcified annulus. Mildly thickened leaflets . - Atrial septum: No defect or patent foramen ovale was identified.      Assessment and Plan  1. LE edema - likely combination of lymphedema and acute on chronic diastolic HF - fluid status has been very labile, management has been complicated by the lack of response to aggressive diuretics - currently on bumex 71m bid and metolazone 2.514mdaily, edema and weight are improving. We will repeat a BMET and Mg level, continue current diuretics.       F/u 6 weeks.    JoArnoldo LenisM.D.

## 2016-02-19 NOTE — Telephone Encounter (Signed)
Refill appropriate and filled per protocol. 

## 2016-02-19 NOTE — Patient Instructions (Signed)
Your physician recommends that you schedule a follow-up appointment in: [place] [address] [place]  Your physician recommends that you continue on your current medications as directed. Please refer to the Current Medication list given to you today.  WE WILL CONTACT HOME HEALTH TO DRAW LABS  Thank you for choosing [facility]!!

## 2016-02-20 ENCOUNTER — Other Ambulatory Visit: Payer: Self-pay | Admitting: *Deleted

## 2016-02-20 DIAGNOSIS — I10 Essential (primary) hypertension: Secondary | ICD-10-CM | POA: Diagnosis not present

## 2016-02-20 NOTE — Patient Outreach (Signed)
[facility]) Care Management  02/20/2016  Michael [name] [birth year] [id number]   [name] is a 70 year old male with chronic diastolic heart failure with EF 60-65%, history of bradycardia, type II Diabetes, Hypertension, Iron Deficiency Anemia, NASH + Hepatomegaly, and degeneratove disc disease s/p surgery of L2-L3.   Michael [name] was admitted to the hospital in February with Acute/Chronic Diastolic Heart Failure and worsening lymphedema. Thereafter, he spent 21 days at [facility]. Michael [name] was referred to [place] [facility] for assistance with management of CHF.   [facility] Management has been following Michael [name] in the community since his discharge from SNF. Focus of care management services has been around CHF and DM Disease management with particular attention to volume management and peripheral edema/lymphedema and lower extremity skin ailments.   I am seeing Michael [name] today to support management of his CHF and Essential Hypertension. I performed venipuncture for BMET and Magnesium and submitted the sample to [facility] labs in [place] with request for results to Dr. [name].   Plan: I will follow up with Michael [name] by phone later this week.    [place] [facility]  [phone]

## 2016-02-21 ENCOUNTER — Ambulatory Visit (HOSPITAL_COMMUNITY): Payer: Medicare Other | Admitting: Physical Therapy

## 2016-02-21 DIAGNOSIS — Z794 Long term (current) use of insulin: Secondary | ICD-10-CM | POA: Diagnosis not present

## 2016-02-21 DIAGNOSIS — I890 Lymphedema, not elsewhere classified: Secondary | ICD-10-CM

## 2016-02-21 DIAGNOSIS — L89312 Pressure ulcer of right buttock, stage 2: Secondary | ICD-10-CM | POA: Diagnosis not present

## 2016-02-21 DIAGNOSIS — R262 Difficulty in walking, not elsewhere classified: Secondary | ICD-10-CM | POA: Diagnosis not present

## 2016-02-21 DIAGNOSIS — M1712 Unilateral primary osteoarthritis, left knee: Secondary | ICD-10-CM | POA: Diagnosis not present

## 2016-02-21 DIAGNOSIS — Z7984 Long term (current) use of oral hypoglycemic drugs: Secondary | ICD-10-CM | POA: Diagnosis not present

## 2016-02-21 DIAGNOSIS — Z79891 Long term (current) use of opiate analgesic: Secondary | ICD-10-CM | POA: Diagnosis not present

## 2016-02-21 DIAGNOSIS — M6281 Muscle weakness (generalized): Secondary | ICD-10-CM

## 2016-02-21 DIAGNOSIS — M549 Dorsalgia, unspecified: Secondary | ICD-10-CM | POA: Diagnosis not present

## 2016-02-21 DIAGNOSIS — L89321 Pressure ulcer of left buttock, stage 1: Secondary | ICD-10-CM | POA: Diagnosis not present

## 2016-02-21 DIAGNOSIS — K219 Gastro-esophageal reflux disease without esophagitis: Secondary | ICD-10-CM | POA: Diagnosis not present

## 2016-02-21 DIAGNOSIS — E785 Hyperlipidemia, unspecified: Secondary | ICD-10-CM | POA: Diagnosis not present

## 2016-02-21 DIAGNOSIS — I5032 Chronic diastolic (congestive) heart failure: Secondary | ICD-10-CM | POA: Diagnosis not present

## 2016-02-21 DIAGNOSIS — I110 Hypertensive heart disease with heart failure: Secondary | ICD-10-CM | POA: Diagnosis not present

## 2016-02-21 DIAGNOSIS — M25561 Pain in right knee: Secondary | ICD-10-CM | POA: Diagnosis not present

## 2016-02-21 DIAGNOSIS — R2681 Unsteadiness on feet: Secondary | ICD-10-CM | POA: Diagnosis not present

## 2016-02-21 DIAGNOSIS — K7581 Nonalcoholic steatohepatitis (NASH): Secondary | ICD-10-CM | POA: Diagnosis not present

## 2016-02-21 DIAGNOSIS — M25562 Pain in left knee: Secondary | ICD-10-CM | POA: Diagnosis not present

## 2016-02-21 DIAGNOSIS — E039 Hypothyroidism, unspecified: Secondary | ICD-10-CM | POA: Diagnosis not present

## 2016-02-21 DIAGNOSIS — E1140 Type 2 diabetes mellitus with diabetic neuropathy, unspecified: Secondary | ICD-10-CM | POA: Diagnosis not present

## 2016-02-21 NOTE — Therapy (Addendum)
[facility] [address] [place], [place], [address] Phone: [phone]   Fax:  [phone]  Physical Therapy Treatment  Patient Details  Name: Michael [name] MRN: [id number] Date of Birth: 1946/08/11 Referring Provider: Dr. Vic Blackbird   Encounter Date: 02/21/2016      PT End of Session - 02/21/16 1150    Visit Number 8   Number of Visits 26   Date for PT Re-Evaluation 03/22/16   Authorization Type [place] - Visit Number 8   Authorization - Number of Visits 18  new g code started    PT Start Time 1000   PT Stop Time 1117   PT Time Calculation (min) 77 min   Equipment Utilized During Treatment Gait belt   Activity Tolerance Patient tolerated treatment well      Past Medical History  Diagnosis Date  . COPD (chronic obstructive pulmonary disease) ([place])   . Type 2 diabetes mellitus ([place])   . Hyperlipidemia   . Essential hypertension   . Iron deficiency anemia   . NASH (nonalcoholic steatohepatitis)   . Diabetic neuropathy ([place])   . GERD (gastroesophageal reflux disease)   . Arthritis   . Depression   . Diastolic dysfunction   . Left knee DJD   . Lumbar spinal stenosis   . DDD (degenerative disc disease), lumbosacral   . Peripheral edema     Past Surgical History  Procedure Laterality Date  . Hernia repair    . Shoulder surgery      Rght-rotator cuff  . Lipoma removal      Stomach  . Knee arthroscopy with medial menisectomy Left 11/06/2012    Procedure: KNEE ARTHROSCOPY WITH MEDIAL MENISECTOMY;  Surgeon: Carole Civil, MD;  Location: [facility];  Service: Orthopedics;  Laterality: Left;  . Lumbar laminectomy/decompression microdiscectomy Left 08/30/2013    Procedure: LUMBAR LAMINECTOMY/DECOMPRESSION MICRODISCECTOMY LEFT  LUMBAR TWO THREE;  Surgeon: Otilio Connors, MD;  Location: [facility];  Service: Neurosurgery;  Laterality: Left;  . Egd with enteroscopy  2011    [facility]: normal esophagus  and stomach. Normal duodenum, jejunum. No evidence of AVMs.   . Colonoscopy  2011    [facility]: normal colon, normal distal ileum  . Colonoscopy with propofol N/A 07/21/2014    Procedure: ATTEMPTED COLONOSCOPY WITH PROPOFOL-HAD TO STOP DUE TO BRADYCARDIA;  Surgeon: Daneil Dolin, MD;  Location: [facility];  Service: Endoscopy;  Laterality: N/A;  . Esophagogastroduodenoscopy (egd) with propofol N/A 07/21/2014    Procedure: ESOPHAGOGASTRODUODENOSCOPY (EGD) WITH PROPOFOL;  Surgeon: Daneil Dolin, MD;  Location: [facility];  Service: Endoscopy;  Laterality: N/A;  . Biopsy N/A 07/21/2014    Procedure: BIOPSY;  Surgeon: Daneil Dolin, MD;  Location: [facility];  Service: Endoscopy;  Laterality: N/A;    There were no vitals filed for this visit.      Subjective Assessment - 02/21/16 1014    Subjective Pt come today for evaluation of strength and gait as well as lymphedema treatment.  Pt states that prior to hospitalization on 10/05/2015 he was walking in his apartment with a walker.  He was admitted to [facility] on 15 thru 1/9 for acute CHF.  After his acute stay he was admitted to SNF for rehabilitation.  However, he states that the MD there stated that he would never walk again therefore the therapist did not work on walking they only had him lifting weights with his arm.  He feels that he can get back to walking with assistance.     Pertinent History CHF, DM, HTN, S/P lumbar surgeryL2-3, lymphedema, COPD,    How long can you sit comfortably? no problem    How long can you stand comfortably? 2' with walker with forward bent position and knees flexed.     How long can you walk comfortably? not safe with one person assist at this time    Currently in Pain? Yes   Pain Score 8    Pain Location Knee   Pain Orientation Right;Left   Pain Descriptors / Indicators Aching   Pain Type Chronic pain   Pain Onset More than a month ago   Pain Frequency Constant   Aggravating Factors  legs down and weight bearing    Pain  Relieving Factors elevation             [facility] PT Assessment - 02/21/16 0001    Assessment   Medical Diagnosis Bilateral lymphedema   Referring Provider Dr. Vic Blackbird    Onset Date/Surgical Date 01/05/16   Next MD Visit 02/07/2016   Prior Therapy home health   Precautions   Precautions Fall   Restrictions   Weight Bearing Restrictions No   Balance Screen   Has the patient fallen in the past 6 months No   Has the patient had a decrease in activity level because of a fear of falling?  Yes   Is the patient reluctant to leave their home because of a fear of falling?  Yes   [place] residence   [place] entrance   Prior Function   Level of Independence Needs assistance with homemaking;Needs assistance with transfers  has an aide 15 hours a week.    Cognition   Overall Cognitive Status Within Functional Limits for tasks assessed   Observation/Other Assessments   Other Surveys  --  Lymphedema impact score 84 with vocation omitted    Posture/Postural Control   Posture/Postural Control Postural limitations   Postural Limitations Decreased lumbar lordosis;Anterior pelvic tilt;Flexed trunk;Rounded Shoulders   Posture Comments knees flexed with standing    ROM / Strength   AROM / PROM / Strength AROM;Strength   AROM   AROM Assessment Site Knee   Right/Left Knee Right;Left   Right Knee Extension 10   Right Knee Flexion 105   Left Knee Extension 7   Left Knee Flexion 105   Strength   Strength Assessment Site Ankle;Knee;Hip   Right/Left Hip Right;Left   Right Hip Flexion 2/5   Right Hip Extension 1/5   Right Hip ABduction 1/5   Left Hip Flexion 2/5   Left Hip Extension 1/5   Left Hip ABduction 2-/5   Left Hip ADduction 2-/5   Right/Left Knee Right;Left   Right Knee Extension 3/5   Left Knee Extension 3/5   Right/Left Ankle Right;Left   Right Ankle Dorsiflexion 4/5   Left Ankle Dorsiflexion 5/5   Bed Mobility   Bed  Mobility Supine to Sit;Sit to Supine   Supine to Sit 3: Mod assist   Sit to Supine 4: Min assist                     [facility] Adult PT Treatment/Exercise - 02/21/16 0001    Exercises   Exercises Lumbar   Lumbar Exercises: Standing   Other Standing Lumbar Exercises stand at wheelchair x 2'    Lumbar Exercises: Seated   Sit  to Stand Limitations x3   Other Seated Lumbar Exercises LAQ x 10 B    Lumbar Exercises: Supine   Glut Set 10 reps   Clam 5 reps   Heel Slides 10 reps   Bent Knee Raise 10 reps   Bridge 10 reps   Bridge Limitations attempted unable to raise bottom off of the table    Other Supine Lumbar Exercises Lt hip ab/adduction    Manual Therapy   Manual Therapy --  no manual done due to time restraints    Compression Bandaging multilayer short stretch bandages and foam to B LE including toe wraps.       Pt B LE washed and moisturized.   Pt had B LE wrapped using foam and multilayer compression bandages from toe to knee.          PT Education - 02/21/16 1150    Education provided Yes   Education Details To take bandages off if they are to uncomfortable and HEP for strengthening    Person(s) Educated Patient   Methods Explanation;Handout;Demonstration   Comprehension Verbalized understanding;Returned demonstration          PT Short Term Goals - 02/21/16 1212    PT SHORT TERM GOAL #1   Title Pt will have a  5 cm reduction in bilateral  for improved fitting of clothing    Time 3   Period Weeks   Status Achieved   PT SHORT TERM GOAL #2   Title Pt will be able to identify the signs and symptoms of cellulitis due to prolong lymphstasis   Time 1   Period Weeks   Status Achieved   PT SHORT TERM GOAL #3   Title Pt will have a 5 cm decrease at dorsum of foot to improve ability to don shoes    Time 3   Period Weeks   Status On-going   PT SHORT TERM GOAL #4   Title Pt will be I in remedial exercises to promote lymph circulaiton    Time 2   Period  Weeks   Status On-going   PT SHORT TERM GOAL #5   Title Pt to be able to complete bed mobility independently    Time 2   Period Weeks   Status New   Additional Short Term Goals   Additional Short Term Goals Yes   PT SHORT TERM GOAL #6   Title Patient to be able to stand for five minutes for improved transfers and decreased risk of falling    Time 3   Period Weeks   Status New           PT Long Term Goals - 02/21/16 1214    PT LONG TERM GOAL #1   Title Pt will have a 7cm  reduction in fluid in bilateral LE  to promote independence of transfers. Pt to be able to transfer to bed and be able to lift his feet onto the bed on his own.    Time 6   Period Weeks   Status On-going   PT LONG TERM GOAL #2   Title Pt to have acquired compression garment for bilateral  LE to allow maintance phase of treatment to begin    Time 6   Period Weeks   Status On-going   PT LONG TERM GOAL #3   Title Pt to have no drainage from either LE to reduce risk of infection .   Time 3   Period Weeks   Status Achieved  PT LONG TERM GOAL #4   Title Pt life impact to decrease by at least 15 points to show improved physical/functional concerns    Time 6   Period Weeks   Status On-going   PT LONG TERM GOAL #5   Title Pt to be able to ambulate with walker for 25 feet to allow patient to walk in his apartment to allow patient to go into the restroom when his aide is not present.    Baseline March 13, 2016: new non ambulatory.    Time 6   Period Weeks   Status New               Plan - 03/13/2016 1152    Clinical Impression Statement Pt with orders for strengthening and ambulation.  Pt was ambulatory with a walker in his apartment until January when he went into the hospital.  At this time he is able to stand but is unable to walk.  He was referred to therapy for bilateral wound and lymphedema and is now being referred for strengthening and improving his mobility.  Pt  bilateral LE significantly smaller  with no wounds at this tme.  Patient anterior LE no longer purple but are still slightly discolored.  PT assessed for stength and mobility with significant weakness and activity intolerance as well as decreased balance.  Pt will benefit from skilled physical therapy services to improve his strength, endurance and mobility to decrease his risk of falling as well as lymphedema management.  At this time Michael [name] states that he his sleeping in his recliner due to the difficulty of getting in and out of his bed.   He has three bed sores on his bottom secondary to pressure.  He was advised that he must get back into the bed for several reasons one to allow him to sleep on his side to get pressue off of his bottom and also to complete his exercises if he wants to walk again.  Michael [name] should recieve his compression garments within the next few weeks.  Once he is independent in donning and doffing he may be discharged from lymphedema services and continue with strengthening with progression to ambulation.    Rehab Potential Good   PT Frequency 3x / week   PT Duration 6 weeks  additional 6 weeks to work on mobility    PT Treatment/Interventions ADLs/Self Care Home Management;Compression bandaging;Manual techniques;Manual lymph drainage;Therapeutic exercise;Balance training;Therapeutic activities;Patient/family education;Gait training;Functional mobility training   PT Next Visit Plan Pt to begin mobility and strengthening exercises.  Begin bed mobility including sit to supine to sit repetitively; rolling, and gait with two people assist.  Re measure LE and continue with decongestive techniques until pt has his compression garments.  Add g-code for gait and mobility current CN goal CM    Consulted and Agree with Plan of Care Patient      Patient will benefit from skilled therapeutic intervention in order to improve the following deficits and impairments:  Pain, Increased edema, Decreased activity tolerance,  Decreased mobility, Decreased range of motion, Decreased balance, Decreased strength, Difficulty walking, Postural dysfunction  Visit Diagnosis: Lymphedema, not elsewhere classified - Plan: PT plan of care cert/re-cert  Difficulty in walking, not elsewhere classified - Plan: PT plan of care cert/re-cert  Unsteadiness on feet - Plan: PT plan of care cert/re-cert  Muscle weakness (generalized) - Plan: PT plan of care cert/re-cert       G-Codes - 2016/03/13 1425    Functional Assessment Tool Used  clinical judgement; induration    Functional Limitation Other PT primary   Other PT Primary Goal Status (U2353) At least 40 percent but less than 60 percent impaired, limited or restricted   Other PT Primary Discharge Status [phone]) At least 40 percent but less than 60 percent impaired, limited or restricted      Problem List Patient Active Problem List   Diagnosis Date Noted  . Peripheral edema 10/05/2015  . Diastolic CHF ([place]) 15/40/0867  . Diastolic dysfunction 61/95/0932  . Stage II pressure ulcer of sacral region 02/13/2015  . Atypical nevi 01/18/2015  . Intertriginous candidiasis 12/23/2014  . Hypothyroidism 11/30/2014  . Bradycardia 07/21/2014  . Sinus pause 07/21/2014  . Liver fibrosis ([place]) 06/29/2014  . IDA (iron deficiency anemia) 06/29/2014  . Iron deficiency anemia 05/31/2014  . Hepatomegaly 05/25/2014  . Difficulty walking 05/03/2014  . Knee pain, chronic 05/03/2014  . Stiffness of joint, not elsewhere classified, pelvic region and thigh 05/03/2014  . Dermatitis 04/12/2014  . HNP (herniated nucleus pulposus), lumbar 08/30/2013  . Spinal stenosis of lumbar region 07/06/2013  . Carpal tunnel syndrome 04/13/2013  . Lymphedema 04/13/2013  . Status post arthroscopy of left knee 04/13/2013  . DDD (degenerative disc disease), lumbosacral 04/13/2013  . Peripheral neuropathy ([place]) 01/05/2013  . S/P arthroscopy of left knee 11/09/2012  . Osteoarthritis of left knee 10/27/2012   . Meniscus tear 10/07/2012  . Patellar tendonitis 07/01/2012  . NASH (nonalcoholic steatohepatitis) 05/22/2007  . Type II diabetes mellitus with neurological manifestations ([place]) 05/20/2007  . Hyperlipidemia 10/15/2006  . OBESITY, MORBID 10/15/2006  . Essential hypertension 10/15/2006  . GERD 10/15/2006    Rayetta Humphrey, PT CLT [phone] 02/21/2016, 2:26 PM  [facility] [address] [place], [place], [address] Phone: [phone]   Fax:  [phone]  Name: Michael [name] MRN: [id number] Date of Birth: 1945/10/28

## 2016-02-21 NOTE — Patient Instructions (Addendum)
Toe Raise (Sitting)    Raise toes, keeping heels on floor. Repeat _10___ times per set. Do _1___ sets per session. Do 3____ sessions per day.  http://orth.exer.us/46   Copyright  VHI. All rights reserved.  Heel Raise (Sitting)    Raise heels, keeping toes on floor. Repeat _10___ times per set. Do __1__ sets per session. Do __3__ sessions per day.  http://orth.exer.us/44   Copyright  VHI. All rights reserved.  Knee Extension (Sitting)    Place _0___ pound weight on left ankle and straighten knee fully, lower slowly. Repeat _10___ times per set. Do _1__ sets per session. Do _3___ sessions per day.  http://orth.exer.us/732   Copyright  VHI. All rights reserved.  Bridging    Slowly raise buttocks from floor, keeping stomach tight. Repeat _10___ times per set. Do 1____ sets per session. Do _2___ sessions per day.  http://orth.exer.us/1096   Copyright  VHI. All rights reserved.  Hip Abduction / Adduction: with Extended Knee (Supine)    Bring left leg out to side and return. Keep knee straight. Repeat _10___ times per set. Do ___1_ sets per session. Do _2___ sessions per day.  http://orth.exer.us/680   Copyright  VHI. All rights reserved.  Hip Abduction / Adduction: with Knee Flexion (Supine)    With right knee bent, gently lower knee to side and return. Repeat __10__ times per set. Do _1___ sets per session. Do _2___ sessions per day.  http://orth.exer.us/682   Copyright  VHI. All rights reserved.  Bent Leg Lift (Hook-Lying)    Tighten stomach and slowly raise right leg ___2_ inches from floor. Keep trunk rigid. Hold _2___ seconds.2 Repeat __10__ times per set. Do _1___ sets per session. Do ____ sessions per day.  http://orth.exer.us/1090   Copyright  VHI. All rights reserved.  Isometric Gluteals    Tighten buttock muscles. Repeat 10____ times per set. Do __1__ sets per session. Do ___10_ sessions per day.  http://orth.exer.us/1126    Copyright  VHI. All rights reserved.

## 2016-02-22 ENCOUNTER — Telehealth: Payer: Self-pay | Admitting: Family Medicine

## 2016-02-22 LAB — BASIC METABOLIC PANEL
BUN: 27
CHLORIDE: 89 mmol/L
CO2: 28 mmol/L
CREATININE: 1.14
Calcium: 9 mg/dL
GLUCOSE: 115
Potassium: 3.9 mmol/L
Sodium: 133

## 2016-02-22 LAB — MAGNESIUM: MAGNESIUM: 1.1

## 2016-02-22 NOTE — Telephone Encounter (Signed)
Call placed to [facility] SN with [facility].   Requested to extend [facility] SN services x1 visit to apply hydrocolloid to sacrum.   Reports that area has almost healed.   VO given.

## 2016-02-22 NOTE — Telephone Encounter (Signed)
Noted  

## 2016-02-22 NOTE — Telephone Encounter (Signed)
[name] from [facility] calling to get orders for wound care if possible  [phone]

## 2016-02-23 ENCOUNTER — Other Ambulatory Visit: Payer: Self-pay | Admitting: *Deleted

## 2016-02-23 ENCOUNTER — Telehealth: Payer: Self-pay | Admitting: Family Medicine

## 2016-02-23 MED ORDER — OXYCODONE-ACETAMINOPHEN 7.5-325 MG PO TABS: ORAL_TABLET | ORAL | Status: DC

## 2016-02-23 NOTE — Telephone Encounter (Signed)
Prescription printed and patient made aware to come to office to pick up on 02/27/2016.

## 2016-02-23 NOTE — Telephone Encounter (Signed)
Patient calling requesting prescription for his  HYDROcodone-acetaminophen (NORCO/VICODIN) 5-325 MG per tablet 1 tablet  CB# [phone] Would like to pick up today

## 2016-02-23 NOTE — Telephone Encounter (Signed)
MD please advise.   Last refill on Percocet is 01/30/2016.

## 2016-02-23 NOTE — Telephone Encounter (Signed)
It is oxycodone acetaminophen, okay to refill this

## 2016-02-23 NOTE — Patient Outreach (Signed)
Michael [name] Michael [name]) Care Management  02/23/2016  Michael [name] [birth year] [id number]    I reached out to [name] today to see is he has heard from anyone regarding his recent lab results and he has not. I forwarded labs to his cardiologist and primary care provider, requesting input regarding any change in plan of care.   Plan: I will return a call to [name] next week with follow up.    [facility]  [phone]

## 2016-02-23 NOTE — Telephone Encounter (Signed)
Ok to refill??  Last office visit/ refill 01/30/2016.

## 2016-02-23 NOTE — Telephone Encounter (Signed)
Okay to refill? 

## 2016-02-23 NOTE — Telephone Encounter (Signed)
Patient left vm saying he wanted a refill on his oxyCODONE-acetaminophen (PERCOCET) 7.5-325 MG I called patient back to clarify which medication he wanted. This is the one he meant to say this morning he is sorry for the misunderstanding.

## 2016-02-24 ENCOUNTER — Other Ambulatory Visit: Payer: Self-pay | Admitting: Family Medicine

## 2016-02-27 ENCOUNTER — Telehealth: Payer: Self-pay | Admitting: *Deleted

## 2016-02-27 DIAGNOSIS — M1712 Unilateral primary osteoarthritis, left knee: Secondary | ICD-10-CM | POA: Diagnosis not present

## 2016-02-27 DIAGNOSIS — L89321 Pressure ulcer of left buttock, stage 1: Secondary | ICD-10-CM | POA: Diagnosis not present

## 2016-02-27 DIAGNOSIS — Z79891 Long term (current) use of opiate analgesic: Secondary | ICD-10-CM | POA: Diagnosis not present

## 2016-02-27 DIAGNOSIS — I890 Lymphedema, not elsewhere classified: Secondary | ICD-10-CM | POA: Diagnosis not present

## 2016-02-27 DIAGNOSIS — E1140 Type 2 diabetes mellitus with diabetic neuropathy, unspecified: Secondary | ICD-10-CM | POA: Diagnosis not present

## 2016-02-27 DIAGNOSIS — Z7984 Long term (current) use of oral hypoglycemic drugs: Secondary | ICD-10-CM | POA: Diagnosis not present

## 2016-02-27 DIAGNOSIS — L89312 Pressure ulcer of right buttock, stage 2: Secondary | ICD-10-CM | POA: Diagnosis not present

## 2016-02-27 DIAGNOSIS — Z794 Long term (current) use of insulin: Secondary | ICD-10-CM | POA: Diagnosis not present

## 2016-02-27 DIAGNOSIS — E785 Hyperlipidemia, unspecified: Secondary | ICD-10-CM | POA: Diagnosis not present

## 2016-02-27 DIAGNOSIS — M549 Dorsalgia, unspecified: Secondary | ICD-10-CM | POA: Diagnosis not present

## 2016-02-27 DIAGNOSIS — K219 Gastro-esophageal reflux disease without esophagitis: Secondary | ICD-10-CM | POA: Diagnosis not present

## 2016-02-27 DIAGNOSIS — K7581 Nonalcoholic steatohepatitis (NASH): Secondary | ICD-10-CM | POA: Diagnosis not present

## 2016-02-27 DIAGNOSIS — E039 Hypothyroidism, unspecified: Secondary | ICD-10-CM | POA: Diagnosis not present

## 2016-02-27 DIAGNOSIS — I5032 Chronic diastolic (congestive) heart failure: Secondary | ICD-10-CM | POA: Diagnosis not present

## 2016-02-27 DIAGNOSIS — I110 Hypertensive heart disease with heart failure: Secondary | ICD-10-CM | POA: Diagnosis not present

## 2016-02-27 NOTE — Telephone Encounter (Signed)
-----   Message from [name], MD sent at 02/22/2016  1:27 PM EDT ----- Labs look good, we will continue current meds  J BrancH MD

## 2016-02-27 NOTE — Telephone Encounter (Signed)
Pt aware, routed to pcp 

## 2016-02-27 NOTE — Telephone Encounter (Signed)
Refill appropriate and filled per protocol. 

## 2016-02-28 ENCOUNTER — Ambulatory Visit (HOSPITAL_COMMUNITY): Payer: Medicare Other | Admitting: Physical Therapy

## 2016-02-28 DIAGNOSIS — M6281 Muscle weakness (generalized): Secondary | ICD-10-CM | POA: Diagnosis not present

## 2016-02-28 DIAGNOSIS — R262 Difficulty in walking, not elsewhere classified: Secondary | ICD-10-CM

## 2016-02-28 DIAGNOSIS — M25562 Pain in left knee: Secondary | ICD-10-CM | POA: Diagnosis not present

## 2016-02-28 DIAGNOSIS — M25561 Pain in right knee: Secondary | ICD-10-CM | POA: Diagnosis not present

## 2016-02-28 DIAGNOSIS — R2681 Unsteadiness on feet: Secondary | ICD-10-CM

## 2016-02-28 DIAGNOSIS — I890 Lymphedema, not elsewhere classified: Secondary | ICD-10-CM | POA: Diagnosis not present

## 2016-02-28 NOTE — Therapy (Signed)
[facility] [address] [place], [place], [address] Phone: [phone]   Fax:  [phone]  Physical Therapy Treatment  Patient Details  Name: Michael [name] MRN: [id number] Date of Birth: 12/19/1945 Referring Provider: Dr. Vic Blackbird   Encounter Date: 02/28/2016      PT End of Session - 02/28/16 1412    Visit Number 9   Number of Visits 26   Date for PT Re-Evaluation 03/22/16   Authorization Type [employer] medicare (gcode on visit 8)   Authorization - Visit Number 9   Authorization - Number of Visits 18  new g code started    PT Start Time 0955   PT Stop Time 1110   PT Time Calculation (min) 75 min   Equipment Utilized During Treatment Gait belt   Activity Tolerance Patient tolerated treatment well      Past Medical History  Diagnosis Date  . COPD (chronic obstructive pulmonary disease) ([place])   . Type 2 diabetes mellitus ([place])   . Hyperlipidemia   . Essential hypertension   . Iron deficiency anemia   . NASH (nonalcoholic steatohepatitis)   . Diabetic neuropathy ([place])   . GERD (gastroesophageal reflux disease)   . Arthritis   . Depression   . Diastolic dysfunction   . Left knee DJD   . Lumbar spinal stenosis   . DDD (degenerative disc disease), lumbosacral   . Peripheral edema     Past Surgical History  Procedure Laterality Date  . Hernia repair    . Shoulder surgery      Rght-rotator cuff  . Lipoma removal      Stomach  . Knee arthroscopy with medial menisectomy Left 11/06/2012    Procedure: KNEE ARTHROSCOPY WITH MEDIAL MENISECTOMY;  Surgeon: Carole Civil, MD;  Location: [facility];  Service: Orthopedics;  Laterality: Left;  . Lumbar laminectomy/decompression microdiscectomy Left 08/30/2013    Procedure: LUMBAR LAMINECTOMY/DECOMPRESSION MICRODISCECTOMY LEFT  LUMBAR TWO THREE;  Surgeon: Otilio Connors, MD;  Location: [facility];  Service: Neurosurgery;  Laterality: Left;  . Egd with enteroscopy  2011    [facility]: normal esophagus and stomach. Normal duodenum, jejunum. No evidence of AVMs.   . Colonoscopy  2011    [facility]: normal colon, normal distal ileum  . Colonoscopy with propofol N/A 07/21/2014    Procedure: ATTEMPTED COLONOSCOPY WITH PROPOFOL-HAD TO STOP DUE TO BRADYCARDIA;  Surgeon: Daneil Dolin, MD;  Location: [facility];  Service: Endoscopy;  Laterality: N/A;  . Esophagogastroduodenoscopy (egd) with propofol N/A 07/21/2014    Procedure: ESOPHAGOGASTRODUODENOSCOPY (EGD) WITH PROPOFOL;  Surgeon: Daneil Dolin, MD;  Location: [facility];  Service: Endoscopy;  Laterality: N/A;  . Biopsy N/A 07/21/2014    Procedure: BIOPSY;  Surgeon: Daneil Dolin, MD;  Location: [facility];  Service: Endoscopy;  Laterality: N/A;    There were no vitals filed for this visit.      Subjective Assessment - 02/28/16 1309    Subjective Pt states he has been doing his exercises some but not everyday.  States he is still sleeping in his recliner.     Currently in Pain? No/denies                         [facility] Adult PT Treatment/Exercise - 02/28/16 1406    Bed Mobility   Bed Mobility Supine to Sit;Sit to Supine   Supine to Sit 3: Mod assist   Supine to Sit  Details (indicate cue type and reason) instructed with logrolling   Sit to Supine 4: Min assist   Posture/Postural Control   Posture/Postural Control --   Postural Limitations --   Posture Comments --   Exercises   Exercises Lumbar   Lumbar Exercises: Standing   Other Standing Lumbar Exercises stand at wheelchair x 2' X 2    Other Standing Lumbar Exercises gait X 5 steps with RW and min assist X 1   Lumbar Exercises: Seated   Long Arc Quad on Chair 10 reps   LAQ on Chair Weights (lbs) 3   Sit to Stand Limitations x5   Other Seated Lumbar Exercises --   Lumbar Exercises: Supine   Glut Set 10 reps   Clam 5 reps  pt very tight   Heel Slides 10 reps   Bent Knee Raise 10 reps   Bridge 5 reps   Bridge Limitations attempted unable to raise  bottom off of the table    Straight Leg Raise 10 reps   Straight Leg Raises Limitations AAROM bilaterally   Other Supine Lumbar Exercises Lt hip ab/adduction    Manual Therapy   Manual Therapy --  no manual done due to time restraints    Compression Bandaging multilayer short stretch bandages and foam to B LE including toe wraps.                 PT Education - 02/28/16 1425    Education provided Yes   Education Details encouraged to complete his exercises daily.     Person(s) Educated Patient   Methods Explanation   Comprehension Verbalized understanding          PT Short Term Goals - 02/28/16 1427    PT SHORT TERM GOAL #1   Title Pt will have a  5 cm reduction in bilateral  for improved fitting of clothing    Time 3   Period Weeks   Status Achieved   PT SHORT TERM GOAL #2   Title Pt will be able to identify the signs and symptoms of cellulitis due to prolong lymphstasis   Time 1   Period Weeks   Status Achieved   PT SHORT TERM GOAL #3   Title Pt will have a 5 cm decrease at dorsum of foot to improve ability to don shoes    Time 3   Period Weeks   Status On-going   PT SHORT TERM GOAL #4   Title Pt will be I in remedial exercises to promote lymph circulaiton    Time 2   Period Weeks   Status On-going   PT SHORT TERM GOAL #5   Title Pt to be able to complete bed mobility independently    Time 2   Period Weeks   Status On-going   PT SHORT TERM GOAL #6   Title Patient to be able to stand for five minutes for improved transfers and decreased risk of falling    Time 3   Period Weeks   Status On-going           PT Long Term Goals - 02/28/16 1427    PT LONG TERM GOAL #1   Title Pt will have a 7cm  reduction in fluid in bilateral LE  to promote independence of transfers. Pt to be able to transfer to bed and be able to lift his feet onto the bed on his own.    Time 6   Period Weeks   Status On-going  PT LONG TERM GOAL #2   Title Pt to have acquired  compression garment for bilateral  LE to allow maintance phase of treatment to begin    Time 6   Period Weeks   Status On-going   PT LONG TERM GOAL #3   Title Pt to have no drainage from either LE to reduce risk of infection .   Time 3   Period Weeks   Status Achieved   PT LONG TERM GOAL #4   Title Pt life impact to decrease by at least 15 points to show improved physical/functional concerns    Time 6   Period Weeks   Status On-going   PT LONG TERM GOAL #5   Title Pt to be able to ambulate with walker for 25 feet to allow patient to walk in his apartment to allow patient to go into the restroom when his aide is not present.    Baseline 02/21/2016: new non ambulatory.    Time 6   Period Weeks   Status On-going               Plan - 02/28/16 1417    Clinical Impression Statement Worked with patient on bed mobiltiy using the logroll technique for supine <--> sit.  Progressed reps as able and added straight leg raises with slight AAROM to complete.  pt very rigid and tight with mobitily due to seated position at most times. Especially tight hip flexors, hip ER and knee extensors.  Pt would benefit from laying in prone postion to stretch and get extension in lumbar region.  Extremely forward flexed when standing.  Able to take 5 steps today using RW from EOB to wheelchair with minassist.    Rehab Potential Good   PT Frequency 3x / week   PT Duration 6 weeks  additional 6 weeks to work on mobility    PT Treatment/Interventions ADLs/Self Care Home Management;Compression bandaging;Manual techniques;Manual lymph drainage;Therapeutic exercise;Balance training;Therapeutic activities;Patient/family education;Gait training;Functional mobility training   PT Next Visit Plan continue to progress bed mobility  including sit to supine to sit repetitively; rolling, and gait .  Re measure LE weekly and continue with decongestive techniques until pt has his compression garments.    Consulted and Agree  with Plan of Care Patient      Patient will benefit from skilled therapeutic intervention in order to improve the following deficits and impairments:  Pain, Increased edema, Decreased activity tolerance, Decreased mobility, Decreased range of motion, Decreased balance, Decreased strength, Difficulty walking, Postural dysfunction  Visit Diagnosis: Lymphedema, not elsewhere classified  Difficulty in walking, not elsewhere classified  Unsteadiness on feet  Muscle weakness (generalized)     Problem List Patient Active Problem List   Diagnosis Date Noted  . Peripheral edema 10/05/2015  . Diastolic CHF ([place]) 69/62/9528  . Diastolic dysfunction 41/32/4401  . Stage II pressure ulcer of sacral region 02/13/2015  . Atypical nevi 01/18/2015  . Intertriginous candidiasis 12/23/2014  . Hypothyroidism 11/30/2014  . Bradycardia 07/21/2014  . Sinus pause 07/21/2014  . Liver fibrosis ([place]) 06/29/2014  . IDA (iron deficiency anemia) 06/29/2014  . Iron deficiency anemia 05/31/2014  . Hepatomegaly 05/25/2014  . Difficulty walking 05/03/2014  . Knee pain, chronic 05/03/2014  . Stiffness of joint, not elsewhere classified, pelvic region and thigh 05/03/2014  . Dermatitis 04/12/2014  . HNP (herniated nucleus pulposus), lumbar 08/30/2013  . Spinal stenosis of lumbar region 07/06/2013  . Carpal tunnel syndrome 04/13/2013  . Lymphedema 04/13/2013  . Status post  arthroscopy of left knee 04/13/2013  . DDD (degenerative disc disease), lumbosacral 04/13/2013  . Peripheral neuropathy ([place]) 01/05/2013  . S/P arthroscopy of left knee 11/09/2012  . Osteoarthritis of left knee 10/27/2012  . Meniscus tear 10/07/2012  . Patellar tendonitis 07/01/2012  . NASH (nonalcoholic steatohepatitis) 05/22/2007  . Type II diabetes mellitus with neurological manifestations ([place]) 05/20/2007  . Hyperlipidemia 10/15/2006  . OBESITY, MORBID 10/15/2006  . Essential hypertension 10/15/2006  . GERD 10/15/2006    Teena Irani, PTA/CLT [phone]  02/28/2016, 2:29 PM  [facility] [address] [place], [place], [address] Phone: [phone]   Fax:  [phone]  Name: Michael [name] MRN: [id number] Date of Birth: [birth year]

## 2016-03-06 ENCOUNTER — Ambulatory Visit (HOSPITAL_COMMUNITY): Payer: Medicare Other | Attending: Family Medicine | Admitting: Physical Therapy

## 2016-03-06 DIAGNOSIS — R2681 Unsteadiness on feet: Secondary | ICD-10-CM | POA: Diagnosis not present

## 2016-03-06 DIAGNOSIS — I890 Lymphedema, not elsewhere classified: Secondary | ICD-10-CM

## 2016-03-06 DIAGNOSIS — R262 Difficulty in walking, not elsewhere classified: Secondary | ICD-10-CM | POA: Diagnosis not present

## 2016-03-06 DIAGNOSIS — M25562 Pain in left knee: Secondary | ICD-10-CM | POA: Diagnosis not present

## 2016-03-06 DIAGNOSIS — M25561 Pain in right knee: Secondary | ICD-10-CM | POA: Diagnosis not present

## 2016-03-06 DIAGNOSIS — M6281 Muscle weakness (generalized): Secondary | ICD-10-CM | POA: Diagnosis not present

## 2016-03-06 NOTE — Therapy (Signed)
[facility] [address] [place], [place], [address] Phone: [phone]   Fax:  [phone]  Physical Therapy Treatment  Patient Details  Name: Michael [name] MRN: [id number] Date of Birth: 12-04-1945 Referring Provider: Dr. Vic Blackbird   Encounter Date: 03/06/2016      PT End of Session - 03/06/16 1025    Visit Number 10   Number of Visits 26   Date for PT Re-Evaluation 03/22/16   Authorization Type UHC medicare (gcode on visit 8)   Authorization - Visit Number 10   Authorization - Number of Visits 18   PT Start Time 0905   PT Stop Time 1022   PT Time Calculation (min) 77 min   Equipment Utilized During Treatment Gait belt   Activity Tolerance Patient tolerated treatment well   Behavior During Therapy [facility] for tasks assessed/performed      Past Medical History  Diagnosis Date  . COPD (chronic obstructive pulmonary disease) ([place])   . Type 2 diabetes mellitus ([place])   . Hyperlipidemia   . Essential hypertension   . Iron deficiency anemia   . NASH (nonalcoholic steatohepatitis)   . Diabetic neuropathy ([place])   . GERD (gastroesophageal reflux disease)   . Arthritis   . Depression   . Diastolic dysfunction   . Left knee DJD   . Lumbar spinal stenosis   . DDD (degenerative disc disease), lumbosacral   . Peripheral edema     Past Surgical History  Procedure Laterality Date  . Hernia repair    . Shoulder surgery      Rght-rotator cuff  . Lipoma removal      Stomach  . Knee arthroscopy with medial menisectomy Left 11/06/2012    Procedure: KNEE ARTHROSCOPY WITH MEDIAL MENISECTOMY;  Surgeon: Carole Civil, MD;  Location: [facility];  Service: Orthopedics;  Laterality: Left;  . Lumbar laminectomy/decompression microdiscectomy Left 08/30/2013    Procedure: LUMBAR LAMINECTOMY/DECOMPRESSION MICRODISCECTOMY LEFT  LUMBAR TWO THREE;  Surgeon: Otilio Connors, MD;  Location: [facility];  Service: Neurosurgery;  Laterality: Left;  .  Egd with enteroscopy  2011    [facility]: normal esophagus and stomach. Normal duodenum, jejunum. No evidence of AVMs.   . Colonoscopy  2011    [facility]: normal colon, normal distal ileum  . Colonoscopy with propofol N/A 07/21/2014    Procedure: ATTEMPTED COLONOSCOPY WITH PROPOFOL-HAD TO STOP DUE TO BRADYCARDIA;  Surgeon: Daneil Dolin, MD;  Location: [facility];  Service: Endoscopy;  Laterality: N/A;  . Esophagogastroduodenoscopy (egd) with propofol N/A 07/21/2014    Procedure: ESOPHAGOGASTRODUODENOSCOPY (EGD) WITH PROPOFOL;  Surgeon: Daneil Dolin, MD;  Location: [facility];  Service: Endoscopy;  Laterality: N/A;  . Biopsy N/A 07/21/2014    Procedure: BIOPSY;  Surgeon: Daneil Dolin, MD;  Location: [facility];  Service: Endoscopy;  Laterality: N/A;    There were no vitals filed for this visit.      Subjective Assessment - 03/06/16 0944    Subjective Pt states that he has not been to therapy secondary to having to go to his nephews funeral.  Pt has been sleeping in his bed but wakes up every 2-3 hours.    Pertinent History CHF, DM, HTN, S/P lumbar surgeryL2-3, lymphedema, COPD,    How long can you sit comfortably? no problem    Currently in Pain? No/denies                [facility] Adult PT Treatment/Exercise - 03/06/16  0001    Ambulation/Gait   Assistive device --  at // bars   Gait Pattern Step-through pattern   Ambulation Surface Level   Gait Comments ambulated forward in // x 3 reps backward x 1 rep    Lumbar Exercises: Standing   Other Standing Lumbar Exercises stand at wheelchair x 2' X 2    Other Standing Lumbar Exercises --   Lumbar Exercises: Seated   Long Arc Quad on Chair 15 reps   LAQ on Chair Weights (lbs) 4   Sit to Stand Limitations x5   Lumbar Exercises: Supine   Heel Slides 10 reps   Bridge 20 reps   Straight Leg Raise 10 reps   Other Supine Lumbar Exercises B hip ab/adduction  10   AA   Other Supine Lumbar Exercises pilates 100( only able to complete 40  rest  40 rest 20; diagonal curls _   Manual Therapy   Manual Therapy Manual Lymphatic Drainage (MLD);Compression Bandaging   Manual therapy comments complete to dorsal aspect of B feet   Compression Bandaging multilayer short stretch bandages and foam to B LE including toe wraps.                   PT Short Term Goals - 02/28/16 1427    PT SHORT TERM GOAL #1   Title Pt will have a  5 cm reduction in bilateral  for improved fitting of clothing    Time 3   Period Weeks   Status Achieved   PT SHORT TERM GOAL #2   Title Pt will be able to identify the signs and symptoms of cellulitis due to prolong lymphstasis   Time 1   Period Weeks   Status Achieved   PT SHORT TERM GOAL #3   Title Pt will have a 5 cm decrease at dorsum of foot to improve ability to don shoes    Time 3   Period Weeks   Status On-going   PT SHORT TERM GOAL #4   Title Pt will be I in remedial exercises to promote lymph circulaiton    Time 2   Period Weeks   Status On-going   PT SHORT TERM GOAL #5   Title Pt to be able to complete bed mobility independently    Time 2   Period Weeks   Status On-going   PT SHORT TERM GOAL #6   Title Patient to be able to stand for five minutes for improved transfers and decreased risk of falling    Time 3   Period Weeks   Status On-going           PT Long Term Goals - 02/28/16 1427    PT LONG TERM GOAL #1   Title Pt will have a 7cm  reduction in fluid in bilateral LE  to promote independence of transfers. Pt to be able to transfer to bed and be able to lift his feet onto the bed on his own.    Time 6   Period Weeks   Status On-going   PT LONG TERM GOAL #2   Title Pt to have acquired compression garment for bilateral  LE to allow maintance phase of treatment to begin    Time 6   Period Weeks   Status On-going   PT LONG TERM GOAL #3   Title Pt to have no drainage from either LE to reduce risk of infection .   Time 3   Period Weeks  Status Achieved   PT LONG TERM  GOAL #4   Title Pt life impact to decrease by at least 15 points to show improved physical/functional concerns    Time 6   Period Weeks   Status On-going   PT LONG TERM GOAL #5   Title Pt to be able to ambulate with walker for 25 feet to allow patient to walk in his apartment to allow patient to go into the restroom when his aide is not present.    Baseline 02/21/2016: new non ambulatory.    Time 6   Period Weeks   Status On-going               Plan - 03/06/16 1026    Clinical Impression Statement Significant decreased edema.  Pt is to get his juxtafit soon.  Pt improving with independence in exercise.  PT able to ambulate in parallel bars with better confidence than with walker at this time.     PT Frequency 3x / week   PT Duration 6 weeks   PT Treatment/Interventions ADLs/Self Care Home Management;Compression bandaging;Manual techniques;Manual lymph drainage;Therapeutic exercise;Balance training;Therapeutic activities;Patient/family education;Gait training;Functional mobility training   PT Next Visit Plan continue with progression of strengthening exercises focusing on trunk extension exercises to allow pt to be upright when ambulating instead of forward bent.       Patient will benefit from skilled therapeutic intervention in order to improve the following deficits and impairments:  Pain, Increased edema, Decreased activity tolerance, Decreased mobility, Decreased range of motion, Decreased balance, Decreased strength, Difficulty walking, Postural dysfunction  Visit Diagnosis: Lymphedema, not elsewhere classified  Difficulty in walking, not elsewhere classified  Unsteadiness on feet  Muscle weakness (generalized)     Problem List Patient Active Problem List   Diagnosis Date Noted  . Peripheral edema 10/05/2015  . Diastolic CHF ([place]) 17/61/6073  . Diastolic dysfunction 71/02/2693  . Stage II pressure ulcer of sacral region 02/13/2015  . Atypical nevi 01/18/2015  .  Intertriginous candidiasis 12/23/2014  . Hypothyroidism 11/30/2014  . Bradycardia 07/21/2014  . Sinus pause 07/21/2014  . Liver fibrosis ([place]) 06/29/2014  . IDA (iron deficiency anemia) 06/29/2014  . Iron deficiency anemia 05/31/2014  . Hepatomegaly 05/25/2014  . Difficulty walking 05/03/2014  . Knee pain, chronic 05/03/2014  . Stiffness of joint, not elsewhere classified, pelvic region and thigh 05/03/2014  . Dermatitis 04/12/2014  . HNP (herniated nucleus pulposus), lumbar 08/30/2013  . Spinal stenosis of lumbar region 07/06/2013  . Carpal tunnel syndrome 04/13/2013  . Lymphedema 04/13/2013  . Status post arthroscopy of left knee 04/13/2013  . DDD (degenerative disc disease), lumbosacral 04/13/2013  . Peripheral neuropathy ([place]) 01/05/2013  . S/P arthroscopy of left knee 11/09/2012  . Osteoarthritis of left knee 10/27/2012  . Meniscus tear 10/07/2012  . Patellar tendonitis 07/01/2012  . NASH (nonalcoholic steatohepatitis) 05/22/2007  . Type II diabetes mellitus with neurological manifestations ([place]) 05/20/2007  . Hyperlipidemia 10/15/2006  . OBESITY, MORBID 10/15/2006  . Essential hypertension 10/15/2006  . GERD 10/15/2006    Rayetta Humphrey, PT CLT [phone] 03/06/2016, 10:30 AM  [facility] [place], [place], [address] Phone: [phone]   Fax:  [phone]  Name: Michael [name] MRN: [id number] Date of Birth: Oct 06, 1945

## 2016-03-09 ENCOUNTER — Other Ambulatory Visit: Payer: Self-pay | Admitting: Family Medicine

## 2016-03-11 NOTE — Telephone Encounter (Signed)
Ok to refill??  Last office visit 01/30/2016.  Last refill 01/29/2016.

## 2016-03-11 NOTE — Telephone Encounter (Signed)
OKAY TO REFILL GIVE 2

## 2016-03-11 NOTE — Telephone Encounter (Signed)
rx called in

## 2016-03-12 ENCOUNTER — Ambulatory Visit (HOSPITAL_COMMUNITY): Payer: Medicare Other | Admitting: Physical Therapy

## 2016-03-12 DIAGNOSIS — R2681 Unsteadiness on feet: Secondary | ICD-10-CM

## 2016-03-12 DIAGNOSIS — R262 Difficulty in walking, not elsewhere classified: Secondary | ICD-10-CM

## 2016-03-12 DIAGNOSIS — I890 Lymphedema, not elsewhere classified: Secondary | ICD-10-CM | POA: Diagnosis not present

## 2016-03-12 DIAGNOSIS — M25562 Pain in left knee: Secondary | ICD-10-CM | POA: Diagnosis not present

## 2016-03-12 DIAGNOSIS — M6281 Muscle weakness (generalized): Secondary | ICD-10-CM | POA: Diagnosis not present

## 2016-03-12 DIAGNOSIS — M25561 Pain in right knee: Secondary | ICD-10-CM

## 2016-03-12 NOTE — Therapy (Addendum)
[facility] [address] [place], [place], [address] Phone: [phone]   Fax:  [phone]  Physical Therapy Treatment  Patient Details  Name: Michael [name] MRN: [id number] Date of Birth: 11-27-1945 Referring Provider: Dr. Vic Blackbird   Encounter Date: 03/12/2016      PT End of Session - 03/12/16 1024    Visit Number 11   Number of Visits 26   Date for PT Re-Evaluation 03/22/16   Authorization Type UHC medicare (gcode on visit 8)   Authorization - Visit Number 11   Authorization - Number of Visits 18   PT Start Time 0904   PT Stop Time 1018   PT Time Calculation (min) 74 min   Equipment Utilized During Treatment Gait belt   Activity Tolerance Patient tolerated treatment well   Behavior During Therapy [facility] for tasks assessed/performed      Past Medical History  Diagnosis Date  . COPD (chronic obstructive pulmonary disease) ([place])   . Type 2 diabetes mellitus ([place])   . Hyperlipidemia   . Essential hypertension   . Iron deficiency anemia   . NASH (nonalcoholic steatohepatitis)   . Diabetic neuropathy ([place])   . GERD (gastroesophageal reflux disease)   . Arthritis   . Depression   . Diastolic dysfunction   . Left knee DJD   . Lumbar spinal stenosis   . DDD (degenerative disc disease), lumbosacral   . Peripheral edema     Past Surgical History  Procedure Laterality Date  . Hernia repair    . Shoulder surgery      Rght-rotator cuff  . Lipoma removal      Stomach  . Knee arthroscopy with medial menisectomy Left 11/06/2012    Procedure: KNEE ARTHROSCOPY WITH MEDIAL MENISECTOMY;  Surgeon: Carole Civil, MD;  Location: [facility];  Service: Orthopedics;  Laterality: Left;  . Lumbar laminectomy/decompression microdiscectomy Left 08/30/2013    Procedure: LUMBAR LAMINECTOMY/DECOMPRESSION MICRODISCECTOMY LEFT  LUMBAR TWO THREE;  Surgeon: Otilio Connors, MD;  Location: [place] [facility]UR[facility] ORS;  Service: Neurosurgery;  Laterality: Left;  .  Egd with enteroscopy  2011    [facility]: normal esophagus and stomach. Normal duodenum, jejunum. No evidence of AVMs.   . Colonoscopy  2011    [facility]: normal colon, normal distal ileum  . Colonoscopy with propofol N/A 07/21/2014    Procedure: ATTEMPTED COLONOSCOPY WITH PROPOFOL-HAD TO STOP DUE TO BRADYCARDIA;  Surgeon: Daneil Dolin, MD;  Location: [facility];  Service: Endoscopy;  Laterality: N/A;  . Esophagogastroduodenoscopy (egd) with propofol N/A 07/21/2014    Procedure: ESOPHAGOGASTRODUODENOSCOPY (EGD) WITH PROPOFOL;  Surgeon: Daneil Dolin, MD;  Location: [facility];  Service: Endoscopy;  Laterality: N/A;  . Biopsy N/A 07/21/2014    Procedure: BIOPSY;  Surgeon: Daneil Dolin, MD;  Location: [facility];  Service: Endoscopy;  Laterality: N/A;    There were no vitals filed for this visit.      Subjective Assessment - 03/12/16 1033    Subjective Pt states he is doing some of his exercises but hasn't been standing any at home.  No pain currently.    Currently in Pain? No/denies                         [facility] Adult PT Treatment/Exercise - 03/12/16 0001    Lumbar Exercises: Standing   Other Standing Lumbar Exercises gait 4 bouts with RW.  5', 10', 10', and 5'   Lumbar  Exercises: Seated   Long Arc Quad on Chair 15 reps   LAQ on Chair Weights (lbs) 4   Sit to Stand Limitations x5   Lumbar Exercises: Supine   Clam 10 reps   Heel Slides 10 reps   Bridge 20 reps   Bridge Limitations 2 sets of 10 reps   Straight Leg Raise 10 reps   Straight Leg Raises Limitations AAROM with Rt LE   Other Supine Lumbar Exercises B hip ab/adduction  10    Other Supine Lumbar Exercises pilates 100( only able to complete 40  rest 40 rest 20; diagonal curls _   Manual Therapy   Manual Therapy Manual Lymphatic Drainage (MLD);Compression Bandaging   Manual therapy comments complete to dorsal aspect of B feet   Compression Bandaging multilayer short stretch bandages and foam to B LE                    PT Short Term Goals - 02/28/16 1427    PT SHORT TERM GOAL #1   Title Pt will have a  5 cm reduction in bilateral  for improved fitting of clothing    Time 3   Period Weeks   Status Achieved   PT SHORT TERM GOAL #2   Title Pt will be able to identify the signs and symptoms of cellulitis due to prolong lymphstasis   Time 1   Period Weeks   Status Achieved   PT SHORT TERM GOAL #3   Title Pt will have a 5 cm decrease at dorsum of foot to improve ability to don shoes    Time 3   Period Weeks   Status On-going   PT SHORT TERM GOAL #4   Title Pt will be I in remedial exercises to promote lymph circulaiton    Time 2   Period Weeks   Status On-going   PT SHORT TERM GOAL #5   Title Pt to be able to complete bed mobility independently    Time 2   Period Weeks   Status On-going   PT SHORT TERM GOAL #6   Title Patient to be able to stand for five minutes for improved transfers and decreased risk of falling    Time 3   Period Weeks   Status On-going           PT Long Term Goals - 02/28/16 1427    PT LONG TERM GOAL #1   Title Pt will have a 7cm  reduction in fluid in bilateral LE  to promote independence of transfers. Pt to be able to transfer to bed and be able to lift his feet onto the bed on his own.    Time 6   Period Weeks   Status On-going   PT LONG TERM GOAL #2   Title Pt to have acquired compression garment for bilateral  LE to allow maintance phase of treatment to begin    Time 6   Period Weeks   Status On-going   PT LONG TERM GOAL #3   Title Pt to have no drainage from either LE to reduce risk of infection .   Time 3   Period Weeks   Status Achieved   PT LONG TERM GOAL #4   Title Pt life impact to decrease by at least 15 points to show improved physical/functional concerns    Time 6   Period Weeks   Status On-going   PT LONG TERM GOAL #5   Title Pt  to be able to ambulate with walker for 25 feet to allow patient to walk in his apartment  to allow patient to go into the restroom when his aide is not present.    Baseline 02/21/2016: new non ambulatory.    Time 6   Period Weeks   Status On-going               Plan - 03/12/16 1025    Clinical Impression Statement Continues to stay decompressed with still some swelling dorsal feet and toes.  Pt prefers not to use toe wraps.  Pt was measured about a week ago so is waiting on his garments at this point.  Pt with continued diffiuculty raising LE's for bed mobiltiy and and generalized stiffness.  All flexors are contracted with limited mobilty.  Encouraged patient to lay supine several times daily to stretch all flexors.  Pt verbalized understanding.  STill unable to lift bottom off mat with bridging and requires AAROM to complete SLR's.  Pt ambulated 4 bouts with RW 5', 10', 10' and 5' total.  Pt with nearly 90 degrees forward flexion with ambulation, walking on toes and unable to extend knees completely.     PT Frequency 3x / week   PT Duration 6 weeks   PT Treatment/Interventions ADLs/Self Care Home Management;Compression bandaging;Manual techniques;Manual lymph drainage;Therapeutic exercise;Balance training;Therapeutic activities;Patient/family education;Gait training;Functional mobility training   PT Next Visit Plan continue with progression of strengthening exercises focusing on trunk extension exercises to allow pt to be upright when ambulating instead of forward bent.       Patient will benefit from skilled therapeutic intervention in order to improve the following deficits and impairments:  Pain, Increased edema, Decreased activity tolerance, Decreased mobility, Decreased range of motion, Decreased balance, Decreased strength, Difficulty walking, Postural dysfunction  Visit Diagnosis: Lymphedema, not elsewhere classified  Difficulty in walking, not elsewhere classified  Unsteadiness on feet  Muscle weakness (generalized)  Arthralgia of both lower  legs     Problem List Patient Active Problem List   Diagnosis Date Noted  . Peripheral edema 10/05/2015  . Diastolic CHF ([place]) 69/62/9528  . Diastolic dysfunction 41/32/4401  . Stage II pressure ulcer of sacral region 02/13/2015  . Atypical nevi 01/18/2015  . Intertriginous candidiasis 12/23/2014  . Hypothyroidism 11/30/2014  . Bradycardia 07/21/2014  . Sinus pause 07/21/2014  . Liver fibrosis ([place]) 06/29/2014  . IDA (iron deficiency anemia) 06/29/2014  . Iron deficiency anemia 05/31/2014  . Hepatomegaly 05/25/2014  . Difficulty walking 05/03/2014  . Knee pain, chronic 05/03/2014  . Stiffness of joint, not elsewhere classified, pelvic region and thigh 05/03/2014  . Dermatitis 04/12/2014  . HNP (herniated nucleus pulposus), lumbar 08/30/2013  . Spinal stenosis of lumbar region 07/06/2013  . Carpal tunnel syndrome 04/13/2013  . Lymphedema 04/13/2013  . Status post arthroscopy of left knee 04/13/2013  . DDD (degenerative disc disease), lumbosacral 04/13/2013  . Peripheral neuropathy ([place]) 01/05/2013  . S/P arthroscopy of left knee 11/09/2012  . Osteoarthritis of left knee 10/27/2012  . Meniscus tear 10/07/2012  . Patellar tendonitis 07/01/2012  . NASH (nonalcoholic steatohepatitis) 05/22/2007  . Type II diabetes mellitus with neurological manifestations ([place]) 05/20/2007  . Hyperlipidemia 10/15/2006  . OBESITY, MORBID 10/15/2006  . Essential hypertension 10/15/2006  . GERD 10/15/2006    Teena Irani, PTA/CLT [phone]  03/12/2016, 10:34 AM  [facility] [place], [place], [address] Phone: [phone]   Fax:  [phone]  Name: Michael [name]  MRN: [id number] Date of Birth: 04/11/1946

## 2016-03-13 ENCOUNTER — Other Ambulatory Visit: Payer: Self-pay | Admitting: *Deleted

## 2016-03-13 NOTE — Patient Outreach (Signed)
[facility]) Care Management  03/13/2016  PLUMMER MATICH Feb 20, 1946 [id number]  [name] is a 70 year old male with chronic diastolic heart failure with EF 60-65%, history of bradycardia, type II Diabetes, Hypertension, Iron Deficiency Anemia, NASH + Hepatomegaly, and degeneratove disc disease s/p surgery of L2-L3.   Michael [name] was admitted to the hospital in February with Acute/Chronic Diastolic Heart Failure and worsening lymphedema. Thereafter, he spent 21 days at [facility]. Michael [name] was referred to [facility] for assistance with management of CHF.   [facility] Care Management has been following Michael [name] in the community since his discharge from SNF. Focus of care management services has been around CHF and DM Disease management with particular attention to volume management and peripheral edema/lymphedema and lower extremity skin ailments.   Michael [name] reports today that he is very pleased with the improvement in his lower extremity swelling and skin condition. He has participated in physical therapy at [facility] and is now transitioning to outpatient PT for strengthening,conditioning, and ambulation. He is very pleased about this transition.   Michael [name] home health visits have stopped. He reports that his sacral skin tear healed completely but he is concerned that he now has another tear. He has PT at [facility] tomorrow but is agreeable to me coming for a visit to examine his skin and look over his progress with DM and CHF management on Friday.   Plan: I will see Michael [name] at home on Friday for routine home visit.    [facility]  [phone]

## 2016-03-14 ENCOUNTER — Ambulatory Visit (HOSPITAL_COMMUNITY): Payer: Medicare Other | Admitting: Physical Therapy

## 2016-03-14 DIAGNOSIS — M6281 Muscle weakness (generalized): Secondary | ICD-10-CM | POA: Diagnosis not present

## 2016-03-14 DIAGNOSIS — L89312 Pressure ulcer of right buttock, stage 2: Secondary | ICD-10-CM | POA: Diagnosis not present

## 2016-03-14 DIAGNOSIS — R262 Difficulty in walking, not elsewhere classified: Secondary | ICD-10-CM

## 2016-03-14 DIAGNOSIS — M25561 Pain in right knee: Secondary | ICD-10-CM | POA: Diagnosis not present

## 2016-03-14 DIAGNOSIS — I890 Lymphedema, not elsewhere classified: Secondary | ICD-10-CM | POA: Diagnosis not present

## 2016-03-14 DIAGNOSIS — M25562 Pain in left knee: Secondary | ICD-10-CM | POA: Diagnosis not present

## 2016-03-14 DIAGNOSIS — R2681 Unsteadiness on feet: Secondary | ICD-10-CM

## 2016-03-14 NOTE — Therapy (Signed)
[facility] [address] [place], [place], [address] Phone: [phone]   Fax:  [phone]  Physical Therapy Treatment  Patient Details  Name: Michael [name] MRN: [id number] Date of Birth: May 01, 1946 Referring Provider: Dr. Vic Blackbird   Encounter Date: 03/14/2016      PT End of Session - 03/14/16 1206    Visit Number 12   Number of Visits 26   Date for PT Re-Evaluation 03/22/16   Authorization Type UHC medicare (gcode on visit 8)   Authorization - Visit Number 12   Authorization - Number of Visits 18   PT Start Time 1036   PT Stop Time 1210   PT Time Calculation (min) 94 min   Equipment Utilized During Treatment Gait belt   Activity Tolerance Patient tolerated treatment well   Behavior During Therapy WFL for tasks assessed/performed      Past Medical History  Diagnosis Date  . COPD (chronic obstructive pulmonary disease) ([place])   . Type 2 diabetes mellitus ([place])   . Hyperlipidemia   . Essential hypertension   . Iron deficiency anemia   . NASH (nonalcoholic steatohepatitis)   . Diabetic neuropathy ([place])   . GERD (gastroesophageal reflux disease)   . Arthritis   . Depression   . Diastolic dysfunction   . Left knee DJD   . Lumbar spinal stenosis   . DDD (degenerative disc disease), lumbosacral   . Peripheral edema     Past Surgical History  Procedure Laterality Date  . Hernia repair    . Shoulder surgery      Rght-rotator cuff  . Lipoma removal      Stomach  . Knee arthroscopy with medial menisectomy Left 11/06/2012    Procedure: KNEE ARTHROSCOPY WITH MEDIAL MENISECTOMY;  Surgeon: Carole Civil, MD;  Location: [facility];  Service: Orthopedics;  Laterality: Left;  . Lumbar laminectomy/decompression microdiscectomy Left 08/30/2013    Procedure: LUMBAR LAMINECTOMY/DECOMPRESSION MICRODISCECTOMY LEFT  LUMBAR TWO THREE;  Surgeon: Otilio Connors, MD;  Location: [facility] [facility];  Service: Neurosurgery;  Laterality: Left;  .  Egd with enteroscopy  2011    [facility]: normal esophagus and stomach. Normal duodenum, jejunum. No evidence of AVMs.   . Colonoscopy  2011    [facility]: normal colon, normal distal ileum  . Colonoscopy with propofol N/A 07/21/2014    Procedure: ATTEMPTED COLONOSCOPY WITH PROPOFOL-HAD TO STOP DUE TO BRADYCARDIA;  Surgeon: Daneil Dolin, MD;  Location: [facility];  Service: Endoscopy;  Laterality: N/A;  . Esophagogastroduodenoscopy (egd) with propofol N/A 07/21/2014    Procedure: ESOPHAGOGASTRODUODENOSCOPY (EGD) WITH PROPOFOL;  Surgeon: Daneil Dolin, MD;  Location: [facility];  Service: Endoscopy;  Laterality: N/A;  . Biopsy N/A 07/21/2014    Procedure: BIOPSY;  Surgeon: Daneil Dolin, MD;  Location: [facility];  Service: Endoscopy;  Laterality: N/A;    There were no vitals filed for this visit.      Subjective Assessment - 03/14/16 1203    Subjective Pt states that he is not in the bed yet and can not do the exercises very well in the recliner.  He is not trying to stand    Currently in Pain? Yes   Pain Score 8    Pain Location Knee   Pain Orientation Right;Left   Pain Descriptors / Indicators Aching   Pain Onset More than a month ago   Pain Frequency Constant   Aggravating Factors  WB   Pain Relieving Factors rest  Effect of Pain on Daily Activities increases                LYMPHEDEMA/ONCOLOGY QUESTIONNAIRE - 03/14/16 1103    Date Lymphedema/Swelling Started   Date 12/19/14   What other symptoms do you have   Are you Having Heaviness or Tightness Yes   Are you having Pain Yes   Are you having pitting edema Yes   Body Site legs bilaterally   Is it Hard or Difficult finding clothes that fit Yes   Do you have infections Yes   [place] Sign Yes   Lymphedema Stage   Stage STAGE 2 SPONTANEOUSLY IRREVERSIBLE   Lymphedema Assessments   Lymphedema Assessments Lower extremities   Right Lower Extremity Lymphedema   At Midpatella/Popliteal Crease 41 cm  was 41 on 5/15   30 cm  Proximal to Floor at Lateral Plantar Foot 40 cm  was 41    20 cm Proximal to Floor at Lateral Plantar Foot 29 1  was 31   10 cm Proximal to Floor at Lateral Malleoli 25 cm  was27.8   Circumference of ankle/heel 33.7 cm.  was 36   5 cm Proximal to 1st MTP Joint 27.2 cm  was 28.6   Across MTP Joint 27.8 cm  was 28.4   Around Proximal Great Toe 10.8 cm  was 10   Left Lower Extremity Lymphedema   At Midpatella/Popliteal Crease 40.8 cm  was 44   30 cm Proximal to Floor at Lateral Plantar Foot 38.7 cm  37.8   20 cm Proximal to Floor at Lateral Plantar Foot 28.4 cm  was 28   10 cm Proximal to Floor at Lateral Malleoli 25 cm  25.4   Circumference of ankle/heel 35.7 cm.  was 34.5   5 cm Proximal to 1st MTP Joint 28.5 cm  was 28   Across MTP Joint 28.1 cm  was 28   Around Proximal Great Toe 11.6 cm  was 12                  [facility] Adult PT Treatment/Exercise - 03/14/16 0001    Lumbar Exercises: Standing   Heel Raises 10 reps   Functional Squats 15 reps   Other Standing Lumbar Exercises standing abduction x10@    Other Standing Lumbar Exercises gait at // x 3 RT forward and backward    Lumbar Exercises: Seated   Long Arc Quad on Chair 15 reps   LAQ on Chair Weights (lbs) 4   Sit to Stand Limitations x5   Manual Therapy   Manual Therapy Manual Lymphatic Drainage (MLD);Compression Bandaging   Manual therapy comments complete to dorsal aspect of B feet   Compression Bandaging multilayer short stretch bandages and foam to B LE                   PT Short Term Goals - 03/14/16 1207    PT SHORT TERM GOAL #1   Title Pt will have a  5 cm reduction in bilateral  for improved fitting of clothing    Time 3   Period Weeks   Status Achieved   PT SHORT TERM GOAL #2   Title Pt will be able to identify the signs and symptoms of cellulitis due to prolong lymphstasis   Time 1   Period Weeks   Status Achieved   PT SHORT TERM GOAL #3   Title Pt will have a 5 cm  decrease at dorsum of foot to improve ability  to don shoes    Time 3   Period Weeks   Status On-going   PT SHORT TERM GOAL #4   Title Pt will be I in remedial exercises to promote lymph circulaiton    Time 2   Period Weeks   Status On-going   PT SHORT TERM GOAL #5   Title Pt to be able to complete bed mobility independently    Time 2   Period Weeks   Status On-going   PT SHORT TERM GOAL #6   Title Patient to be able to stand for five minutes for improved transfers and decreased risk of falling    Time 3   Period Weeks   Status On-going           PT Long Term Goals - 03/14/16 1212    PT LONG TERM GOAL #1   Title Pt will have a 7cm  reduction in fluid in bilateral LE  to promote independence of transfers. Pt to be able to transfer to bed and be able to lift his feet onto the bed on his own.    Time 6   Period Weeks   Status On-going   PT LONG TERM GOAL #2   Title Pt to have acquired compression garment for bilateral  LE to allow maintance phase of treatment to begin    Time 6   Period Weeks   Status On-going   PT LONG TERM GOAL #3   Title Pt to have no drainage from either LE to reduce risk of infection .   Time 3   Period Weeks   Status Achieved   PT LONG TERM GOAL #4   Title Pt life impact to decrease by at least 15 points to show improved physical/functional concerns    Time 6   Period Weeks   Status On-going   PT LONG TERM GOAL #5   Title Pt to be able to ambulate with walker for 25 feet to allow patient to walk in his apartment to allow patient to go into the restroom when his aide is not present.    Baseline 02/21/2016: new non ambulatory.    Time 6   Period Weeks   Status On-going               Plan - 03/14/16 1214    Clinical Impression Statement Pt has continued to decrease volume in Rt LE but Lt remains about the same.  Pt is ready for compression garment.  Pt continue to need encouragement to exercise at home as he fatigues quickly with activity  in therapy    Rehab Potential Good   PT Frequency 3x / week   PT Duration 8 weeks   PT Treatment/Interventions Manual lymph drainage;Manual techniques;Gait training;Therapeutic activities;Therapeutic exercise   PT Next Visit Plan May discontinue manual once compression garments have arrived.  Continue with strengthening and gait activities.    Consulted and Agree with Plan of Care Patient      Patient will benefit from skilled therapeutic intervention in order to improve the following deficits and impairments:  Abnormal gait, Decreased balance, Decreased activity tolerance, Decreased range of motion, Decreased strength, Increased edema, Pain  Visit Diagnosis: Lymphedema, not elsewhere classified  Difficulty in walking, not elsewhere classified  Unsteadiness on feet  Muscle weakness (generalized)     Problem List Patient Active Problem List   Diagnosis Date Noted  . Peripheral edema 10/05/2015  . Diastolic CHF ([place]) 11/91/4782  . Diastolic dysfunction 95/62/1308  .  Stage II pressure ulcer of sacral region 02/13/2015  . Atypical nevi 01/18/2015  . Intertriginous candidiasis 12/23/2014  . Hypothyroidism 11/30/2014  . Bradycardia 07/21/2014  . Sinus pause 07/21/2014  . Liver fibrosis ([place]) 06/29/2014  . IDA (iron deficiency anemia) 06/29/2014  . Iron deficiency anemia 05/31/2014  . Hepatomegaly 05/25/2014  . Difficulty walking 05/03/2014  . Knee pain, chronic 05/03/2014  . Stiffness of joint, not elsewhere classified, pelvic region and thigh 05/03/2014  . Dermatitis 04/12/2014  . HNP (herniated nucleus pulposus), lumbar 08/30/2013  . Spinal stenosis of lumbar region 07/06/2013  . Carpal tunnel syndrome 04/13/2013  . Lymphedema 04/13/2013  . Status post arthroscopy of left knee 04/13/2013  . DDD (degenerative disc disease), lumbosacral 04/13/2013  . Peripheral neuropathy ([place]) 01/05/2013  . S/P arthroscopy of left knee 11/09/2012  . Osteoarthritis of left knee  10/27/2012  . Meniscus tear 10/07/2012  . Patellar tendonitis 07/01/2012  . NASH (nonalcoholic steatohepatitis) 05/22/2007  . Type II diabetes mellitus with neurological manifestations ([place]) 05/20/2007  . Hyperlipidemia 10/15/2006  . OBESITY, MORBID 10/15/2006  . Essential hypertension 10/15/2006  . GERD 10/15/2006  Rayetta Humphrey, PT CLT [phone] 03/14/2016, 12:17 PM  [facility] [address] [place], [place], [address] Phone: [phone]   Fax:  [phone]  Name: Michael [name] MRN: [id number] Date of Birth: 1946/08/06

## 2016-03-15 ENCOUNTER — Ambulatory Visit: Payer: Self-pay | Admitting: *Deleted

## 2016-03-18 ENCOUNTER — Encounter: Payer: Self-pay | Admitting: *Deleted

## 2016-03-18 ENCOUNTER — Other Ambulatory Visit: Payer: Self-pay | Admitting: *Deleted

## 2016-03-18 NOTE — Patient Outreach (Signed)
[facility]) Care Management   03/18/2016  Michael [name] 05-Feb-1946 [id number]  Michael [name] is an 70 y.o. male with chronic diastolic heart failure with EF 60-65%, history of bradycardia, type II Diabetes, Hypertension, Iron Deficiency Anemia, NASH + Hepatomegaly, and degeneratove disc disease s/p surgery of L2-L3.   Michael [name] was admitted to the hospital in February with Acute/Chronic Diastolic Heart Failure and worsening lymphedema. Thereafter, he spent 21 days at [facility] Michael [name] was referred to [facility] for assistance with management of CHF.   [facility] Care Management has been following Michael [name] in the community since his discharge from SNF. Focus of care management services has been around CHF and DM Disease management with particular attention to volume management and peripheral edema/lymphedema and lower extremity skin ailments.   Michael [name] has been attending outpatient PT at [facility] for treatment of lymphedema and he is very pleased with the improvement in his lower extremity swelling and skin condition. He is now transitioning to outpatient PT for strengthening,conditioning, and ambulation. He is very pleased about this transition.   Today, I am visiting Michael [name] in response to his concerns about a "new skin tear" on his buttocks".   Subjective: "I think I'm doing a whole lot better. My legs are better and I've lost a whole bunch of weight. I do have this place on my bottom I want you to look at though"  Objective:  BP 106/62 mmHg  Pulse 75  Wt 200 lb (90.719 kg)  SpO2 97%  Review of Systems  Constitutional: Negative.   HENT: Negative.   Eyes: Negative.   Respiratory: Negative.   Cardiovascular: Negative.   Gastrointestinal: Negative.   Genitourinary: Negative.   Musculoskeletal: Positive for myalgias. Negative for falls.  Skin:       Bilateral lower extremities wrapped for treatment of lymphedema; see physical exam and  assessment note for details re: skin on buttocks/sacrum  Neurological: Negative.   Psychiatric/Behavioral: Negative.     Physical Exam  Constitutional: He is oriented to person, place, and time. Vital signs are normal. He appears well-developed and well-nourished. He is active. He does not have a sickly appearance. He does not appear ill.  Cardiovascular: Normal rate and regular rhythm.   Respiratory: Effort normal. He has no wheezes. He has no rhonchi. He has no rales.  GI: Soft. Normal appearance and bowel sounds are normal. There is no tenderness.  Musculoskeletal:       Right knee: He exhibits decreased range of motion.       Left knee: He exhibits decreased range of motion.  Neurological: He is alert and oriented to person, place, and time.  Skin: Skin is warm and dry.     See notes in skin assessment re: sacral skin disorder  Psychiatric: He has a normal mood and affect. His speech is normal and behavior is normal. Judgment and thought content normal. Cognition and memory are normal.    Encounter Medications:   Outpatient Encounter Prescriptions as of 03/18/2016  Medication Sig  . ACCU-CHEK AVIVA PLUS test strip USE AS DIRECTED TWICE DAILY.  Marland Kitchen aspirin EC 81 MG tablet Take 81 mg by mouth daily.  . B-D ULTRAFINE III SHORT PEN 31G X 8 MM MISC USE AS DIRECTED DAILY WITH LANTUS.  Marland Kitchen benazepril (LOTENSIN) 40 MG tablet TAKE ONE TABLET BY MOUTH DAILY.  . bumetanide (BUMEX) 2 MG tablet Take 1.5 tablets (3 mg total) by mouth 2 (two) times daily.  Marland Kitchen  BYSTOLIC 10 MG tablet TAKE ONE TABLET BY MOUTH DAILY.  . clobetasol cream (TEMOVATE) 0.05 % APPLY TO AFFECTED AREA 2 TIMES DAILY.  . diazepam (VALIUM) 5 MG tablet TAKE 1 TABLET BY MOUTH AT BEDTIME AS NEEDED FOR ANXIETY.  . diclofenac sodium (VOLTAREN) 1 % GEL Apply 4 g topically 4 (four) times daily.  . Ferrous Sulfate (IRON) 325 (65 FE) MG TABS Take 1 tablet by mouth daily.   Marland Kitchen gabapentin (NEURONTIN) 400 MG capsule TAKE ONE CAPSULE BY MOUTH  THREE TIMES DAILY.  Marland Kitchen LANTUS SOLOSTAR 100 UNIT/ML Solostar Pen INJECT 30 UNITS SUBCUTANEOUSLY AT BEDTIME.  Marland Kitchen levothyroxine (SYNTHROID, LEVOTHROID) 88 MCG tablet Take 1 tablet (88 mcg total) by mouth daily. Take 1 tab PO Q AM 30 minutes prior to eating or taking any other medications.  . Magnesium 400 MG TABS Take 400 mg by mouth 2 (two) times daily.  . meloxicam (MOBIC) 15 MG tablet TAKE ONE TABLET BY MOUTH DAILY WITH FOOD.  Marland Kitchen metFORMIN (GLUCOPHAGE) 1000 MG tablet TAKE ONE TABLET BY MOUTH TWICE DAILY WITH A MEAL.  Marland Kitchen metolazone (ZAROXOLYN) 2.5 MG tablet Take 1 tablet (2.5 mg total) by mouth daily.  . mupirocin cream (BACTROBAN) 2 % Apply 1 application topically 2 (two) times daily. To legs as needed  . mupirocin ointment (BACTROBAN) 2 % APPLY 1 APPLICATION TO THE LEGS 3 TIMES A DAY  . nystatin cream (MYCOSTATIN) Apply 1 application topically 2 (two) times daily.  Marland Kitchen omeprazole (PRILOSEC) 20 MG capsule TAKE ONE CAPSULE BY MOUTH TWICE DAILY.  Marland Kitchen oxyCODONE-acetaminophen (PERCOCET) 7.5-325 MG tablet Take one tablet by mouth every 6 hours as needed for severe pain. Max APAP 3gm/24hrs from all sources  . potassium chloride (K-DUR) 10 MEQ tablet Take 10 mEq by mouth 2 (two) times daily.  . pravastatin (PRAVACHOL) 40 MG tablet TAKE ONE TABLET BY MOUTH DAILY.   Assessment:  70 year old gentleman living in [place] with CHF, DM, lower extremity lymphedema.   Chronic Health Condition (CHF)- Michael [name] weight on discharge from acute care setting was 195#. His weight quickly went up to as high as 231# after discharge. He is on an aggressive diuretic regimen and outpatient lymphedema treatment which has helped. Michael [name] weight is #200 today. He denies shortness of breath, chest pain, worsened fatigue, or any symptoms. Michael [name] is taking diuretics and all other medications as prescribed. He is seeing his cardiologist as scheduled.   Acute Health Condition (BLE lymphedema) - Michael [name] lymphedema is  drastically improved after outpatient treatment in the physical therapy department at [facility] (interruption in skin integrity inferior gluteal folds) - Mr. Majano developed ulcers of the peri-anal skin, in the inferior gluteal folds, presumably from sitting for extended periods of time because of his severe lymphedema and lower extremity swelling. He had a home health nurse coming weekly to check his skin and change dressings. Mr. Bills reports that his skin healed completed and his home health nurse signed off. At his request because of concerns about a new skin tear, I examined him and found that he has 2 small (~1.5cm) new skin tears on the sacrum in the skin fold and a new 2-3cm skin tear on the right buttock above the gluteal fold. I cleansed, dried, and dressed the wounds as per the routine prescribed when he last had a skin tear in the same area.   Chronic Health Condition (DM) - Mr. Nijjar admittedly has not been as rigorous in  his attention to Diabetes Management since he began having so much trouble with his legs. His most recent 30 day average is 133 (n=14). He is taking medications as prescribed and checking his cbg's as recommended. He is refocusing his efforts on Diabetes management. We reviewed his prescribed carb modified diet.    Plan:   Mr. Geer will continue twice weekly visits to Outpatient PT for strengthening, conditioning, ambulation, balance, and gait training.   Mr. Correll will attend all scheduled provider appointments.   Mr. Brumbaugh will take all medications as prescribed and will check cbg's daily, recording and calling for findings outside established parameters.   Mr. Steel will report new or worsening skin symptoms.  Mr. Rogue Bussing weigh himself daily and record, calling for weight gain of 3# overnight or 5# in a week, increased fatigue/swelling, or shortness of breath/chest pain, or any other new or worsened symptom.   I  will follow up with Mr. Remmert by phone next week.   [facility] CM Care Plan Problem One        Most Recent Value   Care Plan Problem One  Acute/Chronic Health Condition (Skin Tear)    Role Documenting the Problem One  Care Management Coordinator   Care Plan for Problem One  Active   THN Long Term Goal (31-90 days)  Over the next 31 days, patient will verbalize understanding of plan of care for treatment of skin condition   THN Long Term Goal Start Date  03/18/16   Interventions for Problem One Long Term Goal  Explained to patient my findings after examining his skin,  reviewed plan of care for treatment   THN CM Short Term Goal #1 (0-30 days)  Over the next 30 days, patient will call for worsening signs and symptoms of skin condition   THN CM Short Term Goal #1 Start Date  03/18/16   Interventions for Short Term Goal #1  Utilizing teachback method, reviewed signs adn symptoms of worsening skin condition and when to report    [facility] CM Care Plan Problem Three        Most Recent Value   Care Plan Problem Three  Self Health Management Deficits (DM and CHF Management)   Role Documenting the Problem Three  Care Management Coordinator   Care Plan for Problem Three  Active   THN Long Term Goal (31-90) days  Over the next 60 days, patient will establish written plan of self health management for CHF and DM and verbalize understanding of importance of same   [place] [facility] Term Goal Start Date  03/13/16   Interventions for Problem Three Long Term Goal  Utilizing teachback method, reviewed with patient importance of re-establishing plan of care for self health management of CHF and DM   THN CM Short Term Goal #1 (0-30 days)  Over the next 30 days, patient will begin documentation of cbg's and daily weights and guidelines for calling provider when findings are outside established parameters   THN CM Short Term Goal #1 Start Date  03/13/16   Interventions for Short Term Goal #1  Discussed with patient plan to  establish written guidelines for self health monitoring   THN CM Short Term Goal #2 (0-30 days)  Over the next 30 days, patient will establish written schedule of needed long term self health mangaement evaluations (such as HgA1C monitoring, annual foot exam/eye exam)   THN CM Short Term Goal #2 Start Date  03/13/16   Interventions for Short Term Goal #2  Discussed with patient plans for establishing written guidelines for long term self health management      [facility] Management  [phone]

## 2016-03-19 ENCOUNTER — Ambulatory Visit (HOSPITAL_COMMUNITY): Payer: Medicare Other | Admitting: Physical Therapy

## 2016-03-19 DIAGNOSIS — M6281 Muscle weakness (generalized): Secondary | ICD-10-CM

## 2016-03-19 DIAGNOSIS — I890 Lymphedema, not elsewhere classified: Secondary | ICD-10-CM | POA: Diagnosis not present

## 2016-03-19 DIAGNOSIS — R2681 Unsteadiness on feet: Secondary | ICD-10-CM

## 2016-03-19 DIAGNOSIS — R262 Difficulty in walking, not elsewhere classified: Secondary | ICD-10-CM

## 2016-03-19 DIAGNOSIS — M25562 Pain in left knee: Secondary | ICD-10-CM | POA: Diagnosis not present

## 2016-03-19 DIAGNOSIS — M25561 Pain in right knee: Secondary | ICD-10-CM | POA: Diagnosis not present

## 2016-03-19 NOTE — Therapy (Signed)
[facility] [address] [place], [place], [address] Phone: [phone]   Fax:  [phone]  Physical Therapy Treatment  Patient Details  Name: Michael [name] MRN: [id number] Date of Birth: Mar 20, 1946 Referring Provider: Dr. Vic Blackbird   Encounter Date: 03/19/2016      PT End of Session - 03/19/16 1141    Visit Number 13   Number of Visits 26   Date for PT Re-Evaluation 03/22/16   Authorization Type [employer] medicare (gcode on visit 8)   Authorization - Visit Number 13   Authorization - Number of Visits 18   PT Start Time [phone]   PT Stop Time 1115   PT Time Calculation (min) 86 min   Equipment Utilized During Treatment Gait belt   Activity Tolerance Patient tolerated treatment well   Behavior During Therapy [facility] for tasks assessed/performed      Past Medical History  Diagnosis Date  . COPD (chronic obstructive pulmonary disease) ([place])   . Type 2 diabetes mellitus ([place])   . Hyperlipidemia   . Essential hypertension   . Iron deficiency anemia   . NASH (nonalcoholic steatohepatitis)   . Diabetic neuropathy ([place])   . GERD (gastroesophageal reflux disease)   . Arthritis   . Depression   . Diastolic dysfunction   . Left knee DJD   . Lumbar spinal stenosis   . DDD (degenerative disc disease), lumbosacral   . Peripheral edema     Past Surgical History  Procedure Laterality Date  . Hernia repair    . Shoulder surgery      Rght-rotator cuff  . Lipoma removal      Stomach  . Knee arthroscopy with medial menisectomy Left 11/06/2012    Procedure: KNEE ARTHROSCOPY WITH MEDIAL MENISECTOMY;  Surgeon: Carole Civil, MD;  Location: [facility];  Service: Orthopedics;  Laterality: Left;  . Lumbar laminectomy/decompression microdiscectomy Left 08/30/2013    Procedure: LUMBAR LAMINECTOMY/DECOMPRESSION MICRODISCECTOMY LEFT  LUMBAR TWO THREE;  Surgeon: Otilio Connors, MD;  Location: [place] [facility];  Service: Neurosurgery;  Laterality: Left;  .  Egd with enteroscopy  2011    [facility]: normal esophagus and stomach. Normal duodenum, jejunum. No evidence of AVMs.   . Colonoscopy  2011    [facility]: normal colon, normal distal ileum  . Colonoscopy with propofol N/A 07/21/2014    Procedure: ATTEMPTED COLONOSCOPY WITH PROPOFOL-HAD TO STOP DUE TO BRADYCARDIA;  Surgeon: Daneil Dolin, MD;  Location: [facility];  Service: Endoscopy;  Laterality: N/A;  . Esophagogastroduodenoscopy (egd) with propofol N/A 07/21/2014    Procedure: ESOPHAGOGASTRODUODENOSCOPY (EGD) WITH PROPOFOL;  Surgeon: Daneil Dolin, MD;  Location: [facility];  Service: Endoscopy;  Laterality: N/A;  . Biopsy N/A 07/21/2014    Procedure: BIOPSY;  Surgeon: Daneil Dolin, MD;  Location: [facility];  Service: Endoscopy;  Laterality: N/A;    There were no vitals filed for this visit.      Subjective Assessment - 03/19/16 1003    Subjective Pt states the only thing that hurts is his knees.  He is going to get his juxtafit on Thursday    Pertinent History CHF, DM, HTN, S/P lumbar surgeryL2-3, lymphedema, COPD,    How long can you sit comfortably? no problem    Currently in Pain? Yes   Pain Score 8    Pain Location Knee   Pain Orientation Right;Left   Pain Descriptors / Indicators Aching   Pain Type Chronic pain   Pain Radiating  Towards none   Pain Onset More than a month ago   Pain Frequency Constant   Aggravating Factors  weight bearing    Pain Relieving Factors rest, ice    Multiple Pain Sites No               LYMPHEDEMA/ONCOLOGY QUESTIONNAIRE - 03/19/16 1004    Date Lymphedema/Swelling Started   Date 12/19/14   What other symptoms do you have   Are you Having Heaviness or Tightness No  was yes   Are you having Pain Yes  from knees not lymphedema   Are you having pitting edema No  was yes   Body Site legs bilaterally   Is it Hard or Difficult finding clothes that fit Yes  just shoes now.    Do you have infections No  was yes    [place]mer Sign Yes    Lymphedema Stage   Stage STAGE 2 SPONTANEOUSLY IRREVERSIBLE   Lymphedema Assessments   Lymphedema Assessments Lower extremities   Right Lower Extremity Lymphedema   At Midpatella/Popliteal Crease 41 cm  was 41 on 5/15   30 cm Proximal to Floor at Lateral Plantar Foot 39.3 cm  was 46 at eval   20 cm Proximal to Floor at Lateral Plantar Foot 29 1  was 42.5 at eval   10 cm Proximal to Floor at Lateral Malleoli 25 cm  was was 37.3   Circumference of ankle/heel 34 cm.  was 36   5 cm Proximal to 1st MTP Joint 28 cm  was 31.4   Across MTP Joint 28.5 cm  was 31.5   Around Proximal Great Toe 10.8 cm  was 12.5   Left Lower Extremity Lymphedema   At Midpatella/Popliteal Crease 40.8 cm  was 44   30 cm Proximal to Floor at Lateral Plantar Foot 38.5 cm  at eval was 46.5    20 cm Proximal to Floor at Lateral Plantar Foot 27.7 cm  at eval 41.5    10 cm Proximal to Floor at Lateral Malleoli 25.6 cm  was 35    Circumference of ankle/heel 34.2 cm.  was 39.2 at eval   5 cm Proximal to 1st MTP Joint 28.3 cm  was 31.1   Across MTP Joint 26.5 cm  was 32.8   Around Proximal Great Toe 11.8 cm  was 12                  [facility] Adult PT Treatment/Exercise - 03/19/16 0001    Ambulation/Gait   Assistive device --  at // bars   Gait Pattern Step-through pattern  x 3 reps forward and backward   Gait Comments ambulated forward in // x 3 reps backward x 3 rep    Lumbar Exercises: Standing   Other Standing Lumbar Exercises hip abduction, extension x 10 Rep bilaterally    Lumbar Exercises: Seated   Sit to Stand Limitations x5   Lumbar Exercises: Supine   Straight Leg Raise    Other Supine Lumbar Exercises --  AA   Manual Therapy   Manual Therapy Manual Lymphatic Drainage (MLD);Compression Bandaging   Manual therapy comments complete to dorsal aspect of B feet   Compression Bandaging multilayer short stretch bandages and foam to B LE including toe wraps.                    PT Short Term Goals - 03/19/16 1144    PT SHORT TERM GOAL #1   Title Pt will have  a  5 cm reduction in bilateral  for improved fitting of clothing    Time 3   Period Weeks   Status Achieved   PT SHORT TERM GOAL #2   Title Pt will be able to identify the signs and symptoms of cellulitis due to prolong lymphstasis   Time 1   Period Weeks   Status Achieved   PT SHORT TERM GOAL #3   Title Pt will have a 5 cm decrease at dorsum of foot to improve ability to don shoes    Time 3   Period Weeks   Status On-going   PT SHORT TERM GOAL #4   Title Pt will be I in remedial exercises to promote lymph circulaiton    Time 2   Period Weeks   Status Achieved   PT SHORT TERM GOAL #5   Title Pt to be able to complete bed mobility independently    Time 2   Period Weeks   Status On-going   PT SHORT TERM GOAL #6   Title Patient to be able to stand for five minutes for improved transfers and decreased risk of falling    Time 3   Period Weeks   Status On-going           PT Long Term Goals - 03/19/16 1144    PT LONG TERM GOAL #1   Title Pt will have a 7cm  reduction in fluid in bilateral LE  to promote independence of transfers. Pt to be able to transfer to bed and be able to lift his feet onto the bed on his own.    Time 6   Period Weeks   Status Achieved   PT LONG TERM GOAL #2   Title Pt to have acquired compression garment for bilateral  LE to allow maintance phase of treatment to begin    Time 6   Period Weeks   Status Achieved  has been contacted and will get on Thursday.   PT LONG TERM GOAL #3   Title Pt to have no drainage from either LE to reduce risk of infection .   Time 3   Period Weeks   Status Achieved   PT LONG TERM GOAL #4   Title Pt life impact to decrease by at least 15 points to show improved physical/functional concerns    Time 6   Period Weeks   Status On-going   PT LONG TERM GOAL #5   Title Pt to be able to ambulate with walker for 25 feet to allow patient to  walk in his apartment to allow patient to go into the restroom when his aide is not present.    Baseline 02/21/2016: new non ambulatory.    Time 6   Period Weeks   Status On-going               Plan - 03/19/16 1142    Clinical Impression Statement Pt LE fluid volume has stabilized and he is ready for his juxtafit.  The patient will be discharged from lympheema treatment but will continue his strengthening and gait.    Rehab Potential Good   PT Frequency 3x / week   PT Duration 8 weeks   PT Treatment/Interventions Manual lymph drainage;Manual techniques;Gait training;Therapeutic activities;Therapeutic exercise   PT Next Visit Plan begin manual stretching of hip flexors on mat; have pt fill out life impact score for lymphedema to get final score(do at end of session)    Consulted and Agree with  Plan of Care Patient      Patient will benefit from skilled therapeutic intervention in order to improve the following deficits and impairments:  Abnormal gait, Decreased balance, Decreased activity tolerance, Decreased range of motion, Decreased strength, Increased edema, Pain  Visit Diagnosis: Lymphedema, not elsewhere classified  Difficulty in walking, not elsewhere classified  Unsteadiness on feet  Muscle weakness (generalized)     Problem List Patient Active Problem List   Diagnosis Date Noted  . Peripheral edema 10/05/2015  . Diastolic CHF ([place]) 13/88/7195  . Diastolic dysfunction 97/47/1855  . Stage II pressure ulcer of sacral region 02/13/2015  . Atypical nevi 01/18/2015  . Intertriginous candidiasis 12/23/2014  . Hypothyroidism 11/30/2014  . Bradycardia 07/21/2014  . Sinus pause 07/21/2014  . Liver fibrosis ([place]) 06/29/2014  . IDA (iron deficiency anemia) 06/29/2014  . Iron deficiency anemia 05/31/2014  . Hepatomegaly 05/25/2014  . Difficulty walking 05/03/2014  . Knee pain, chronic 05/03/2014  . Stiffness of joint, not elsewhere classified, pelvic region and  thigh 05/03/2014  . Dermatitis 04/12/2014  . HNP (herniated nucleus pulposus), lumbar 08/30/2013  . Spinal stenosis of lumbar region 07/06/2013  . Carpal tunnel syndrome 04/13/2013  . Lymphedema 04/13/2013  . Status post arthroscopy of left knee 04/13/2013  . DDD (degenerative disc disease), lumbosacral 04/13/2013  . Peripheral neuropathy ([place]) 01/05/2013  . S/P arthroscopy of left knee 11/09/2012  . Osteoarthritis of left knee 10/27/2012  . Meniscus tear 10/07/2012  . Patellar tendonitis 07/01/2012  . NASH (nonalcoholic steatohepatitis) 05/22/2007  . Type II diabetes mellitus with neurological manifestations ([place]) 05/20/2007  . Hyperlipidemia 10/15/2006  . OBESITY, MORBID 10/15/2006  . Essential hypertension 10/15/2006  . GERD 10/15/2006    Rayetta Humphrey, PT CLT [phone] 03/19/2016, 11:47 AM  [facility] [address] [place], [place], [address] Phone: [phone]   Fax:  [phone]  Name: Michael [name] MRN: [id number] Date of Birth: [birth year]

## 2016-03-21 ENCOUNTER — Encounter (HOSPITAL_COMMUNITY): Payer: Medicare Other | Admitting: Physical Therapy

## 2016-03-25 ENCOUNTER — Encounter: Payer: Self-pay | Admitting: Family Medicine

## 2016-03-25 ENCOUNTER — Ambulatory Visit (INDEPENDENT_AMBULATORY_CARE_PROVIDER_SITE_OTHER): Payer: Medicare Other | Admitting: Family Medicine

## 2016-03-25 VITALS — BP 112/60 | HR 70 | Temp 98.1°F | Resp 14 | Wt 196.7 lb

## 2016-03-25 DIAGNOSIS — I519 Heart disease, unspecified: Secondary | ICD-10-CM | POA: Diagnosis not present

## 2016-03-25 DIAGNOSIS — E1149 Type 2 diabetes mellitus with other diabetic neurological complication: Secondary | ICD-10-CM | POA: Diagnosis not present

## 2016-03-25 DIAGNOSIS — L89151 Pressure ulcer of sacral region, stage 1: Secondary | ICD-10-CM

## 2016-03-25 DIAGNOSIS — G622 Polyneuropathy due to other toxic agents: Secondary | ICD-10-CM | POA: Diagnosis not present

## 2016-03-25 DIAGNOSIS — I890 Lymphedema, not elsewhere classified: Secondary | ICD-10-CM | POA: Diagnosis not present

## 2016-03-25 DIAGNOSIS — I5189 Other ill-defined heart diseases: Secondary | ICD-10-CM

## 2016-03-25 DIAGNOSIS — I10 Essential (primary) hypertension: Secondary | ICD-10-CM

## 2016-03-25 LAB — COMPREHENSIVE METABOLIC PANEL
ALK PHOS: 50 U/L (ref 40–115)
ALT: 28 U/L (ref 9–46)
AST: 33 U/L (ref 10–35)
Albumin: 4.4 g/dL (ref 3.6–5.1)
BILIRUBIN TOTAL: 0.5 mg/dL (ref 0.2–1.2)
BUN: 40 mg/dL — AB (ref 7–25)
CALCIUM: 9.4 mg/dL (ref 8.6–10.3)
CO2: 31 mmol/L (ref 20–31)
Chloride: <80 mmol/L — ABNORMAL LOW (ref 98–110)
Creat: 1.24 mg/dL (ref 0.70–1.25)
GLUCOSE: 107 mg/dL — AB (ref 70–99)
Potassium: 5 mmol/L (ref 3.5–5.3)
Sodium: 122 mmol/L — ABNORMAL LOW (ref 135–146)
Total Protein: 6.6 g/dL (ref 6.1–8.1)

## 2016-03-25 LAB — CBC WITH DIFFERENTIAL/PLATELET
BASOS PCT: 2 %
Basophils Absolute: 88 cells/uL (ref 0–200)
EOS PCT: 2 %
Eosinophils Absolute: 88 cells/uL (ref 15–500)
HCT: 32.5 % — ABNORMAL LOW (ref 38.5–50.0)
Hemoglobin: 11.2 g/dL — ABNORMAL LOW (ref 13.0–17.0)
LYMPHS PCT: 31 %
Lymphs Abs: 1364 cells/uL (ref 850–3900)
MCH: 28.6 pg (ref 27.0–33.0)
MCHC: 34.5 g/dL (ref 32.0–36.0)
MCV: 82.9 fL (ref 80.0–100.0)
MONOS PCT: 5 %
MPV: 9.1 fL (ref 7.5–12.5)
Monocytes Absolute: 220 cells/uL (ref 200–950)
NEUTROS ABS: 2640 {cells}/uL (ref 1500–7800)
Neutrophils Relative %: 60 %
Platelets: 190 10*3/uL (ref 140–400)
RBC: 3.92 MIL/uL — ABNORMAL LOW (ref 4.20–5.80)
RDW: 13.9 % (ref 11.0–15.0)
WBC: 4.4 10*3/uL (ref 3.8–10.8)

## 2016-03-25 LAB — HEMOGLOBIN A1C
Hgb A1c MFr Bld: 6.8 % — ABNORMAL HIGH (ref <5.7)
Mean Plasma Glucose: 148 mg/dL

## 2016-03-25 LAB — LIPID PANEL
CHOL/HDL RATIO: 3.3 ratio (ref <=5.0)
CHOLESTEROL: 143 mg/dL (ref 125–200)
HDL: 43 mg/dL (ref >=40)
LDL Cholesterol: 59 mg/dL (ref <130)
TRIGLYCERIDES: 205 mg/dL — AB (ref <150)
VLDL: 41 mg/dL — ABNORMAL HIGH (ref <30)

## 2016-03-25 MED ORDER — OXYCODONE-ACETAMINOPHEN 7.5-325 MG PO TABS: ORAL_TABLET | ORAL | Status: DC

## 2016-03-25 NOTE — Assessment & Plan Note (Signed)
Doing well from cardiac standpoint. He has had good diuresis.

## 2016-03-25 NOTE — Assessment & Plan Note (Signed)
Diabetes has been well controlled. We'll recheck his levels today he is on statin drug as well as ACE inhibitor

## 2016-03-25 NOTE — Progress Notes (Signed)
Patient ID: Michael [name], male   DOB: Jan 29, 1946, 70 y.o.   MRN: [id number]   Subjective:    Patient ID: Michael [name], male    DOB: 1946-04-21, 70 y.o.   MRN: [id number]  Patient presents for 3 month F/U Here for follow-up on chronic medical problems. He is doing significantly better. His lymphedema is much better controlled he is now under 200 pounds with regards to his weight. He is still on diuretics as prescribed by cardiology now he is doing home lymphedema therapy with special compression socks. He is also in physical therapy to work on his gait he is now able to stand and walk with support. He is hoping that he will be able to use his walker soon.   Diabetes mellitus with blood sugars have been good he does not have his meter with him today. He is due for repeat A1c.  Good decubitus he states it is much improved his [facility] nurse was helping with his when management after his insurance does not cover his home health.  He request refill on his pain medication  Review Of Systems:  GEN- denies fatigue, fever, weight loss,weakness, recent illness HEENT- denies eye drainage, change in vision, nasal discharge, CVS- denies chest pain, palpitations RESP- denies SOB, cough, wheeze ABD- denies N/V, change in stools, abd pain GU- denies dysuria, hematuria, dribbling, incontinence MSK- + joint pain, muscle aches, injury Neuro- denies headache, dizziness, syncope, seizure activity       Objective:    BP 112/60 mmHg  Pulse 70  Temp(Src) 98.1 F (36.7 C) (Oral)  Resp 14  Wt 196 lb 11.2 oz (89.223 kg) GEN- NAD, alert and oriented x3,sitting in wheelchair  HEENT- PERRL, EOMI, non injected sclera, pink conjunctiva, MMM, oropharynx clear Neck- Supple, no thyromegaly CVS- RRR, no murmur RESP-CTAB ABD-NABS,soft,NT,ND Skin- erythema in gluteal cleft, scab over previous decub right buttocks EXT- decreased lymphema bilat, still has ballooning at feet Pulses- Radial, DP-  2+        Assessment & Plan:      Problem List Items Addressed This Visit    Type II diabetes mellitus with neurological manifestations ([place])    Diabetes has been well controlled. We'll recheck his levels today he is on statin drug as well as ACE inhibitor      Relevant Orders   Hemoglobin A1c   Lipid panel   Peripheral neuropathy (HCC)   OBESITY, MORBID   Lymphedema    Continue with his home lymphedema therapy also physical therapy to help with his gait and strength      Essential hypertension - Primary   Relevant Orders   CBC with Differential/Platelet   Comprehensive metabolic panel   Lipid panel   Diastolic dysfunction    Doing well from cardiac standpoint. He has had good diuresis.       Other Visit Diagnoses    Sacral decubitus ulcer, stage I        much improved, duoderm applied today       Note: This dictation was prepared with Dragon dictation along with smaller phrase technology. Any transcriptional errors that result from this process are unintentional.

## 2016-03-25 NOTE — Patient Instructions (Signed)
F/U 4 months Continue meds We will call with lab results

## 2016-03-25 NOTE — Assessment & Plan Note (Signed)
Continue with his home lymphedema therapy also physical therapy to help with his gait and strength

## 2016-03-26 ENCOUNTER — Other Ambulatory Visit: Payer: Self-pay | Admitting: *Deleted

## 2016-03-26 ENCOUNTER — Encounter (HOSPITAL_COMMUNITY): Payer: Medicare Other | Admitting: Physical Therapy

## 2016-03-26 DIAGNOSIS — E871 Hypo-osmolality and hyponatremia: Secondary | ICD-10-CM

## 2016-03-26 NOTE — Patient Outreach (Signed)
[facility]) Care Management  03/26/2016  Michael [name] Jun 15, 1946 [id number]   I spoke with Michael [name] by phone today. He reports being very pleased with his overall progress. He saw Dr. Buelah Manis for routine primary care visit and said she was very pleased also with his progress. Michael [name] says his legs "look like normal people legs" after having completed a course of treatment at [facility] for lymphedema. He is now participating in outpatient PT for strengthening and conditioning and his goal is to be able to walk, even short distances, with his walker.   Michael [name] is working on improvement in diabetes self monitoring and dietary adherence. We will follow up on this when I see him at home next week.   Plan: I will see Michael [name] at home next week as scheduled.    [facility]  [phone]

## 2016-03-27 ENCOUNTER — Telehealth (HOSPITAL_COMMUNITY): Payer: Self-pay | Admitting: Physical Therapy

## 2016-03-27 NOTE — Telephone Encounter (Signed)
Caregiver can not bring him to this apptment

## 2016-03-28 ENCOUNTER — Encounter (HOSPITAL_COMMUNITY): Payer: Medicare Other | Admitting: Physical Therapy

## 2016-03-30 ENCOUNTER — Other Ambulatory Visit: Payer: Self-pay | Admitting: Family Medicine

## 2016-04-01 ENCOUNTER — Encounter (HOSPITAL_COMMUNITY): Payer: Medicare Other | Admitting: Physical Therapy

## 2016-04-01 NOTE — Telephone Encounter (Signed)
Refill appropriate and filled per protocol. 

## 2016-04-03 ENCOUNTER — Ambulatory Visit (HOSPITAL_COMMUNITY): Payer: Medicare Other | Attending: Family Medicine | Admitting: Physical Therapy

## 2016-04-03 DIAGNOSIS — R2681 Unsteadiness on feet: Secondary | ICD-10-CM | POA: Diagnosis not present

## 2016-04-03 DIAGNOSIS — R262 Difficulty in walking, not elsewhere classified: Secondary | ICD-10-CM | POA: Insufficient documentation

## 2016-04-03 DIAGNOSIS — M25562 Pain in left knee: Secondary | ICD-10-CM | POA: Insufficient documentation

## 2016-04-03 DIAGNOSIS — M6281 Muscle weakness (generalized): Secondary | ICD-10-CM | POA: Insufficient documentation

## 2016-04-03 DIAGNOSIS — M25561 Pain in right knee: Secondary | ICD-10-CM | POA: Insufficient documentation

## 2016-04-03 NOTE — Therapy (Signed)
[place], [place], [address] Phone: [phone]   Fax:  [phone]  Physical Therapy Treatment (Re-Assessment)  Patient Details  Name: Michael [name] MRN: [id number] Date of Birth: 06-08-1946 Referring Provider: Dr. Vic Blackbird   Encounter Date: 04/03/2016      PT End of Session - 04/03/16 1214    Visit Number 14   Number of Visits 22   Date for PT Re-Evaluation 05/01/16   Authorization Type [employer] medicare (gcode on visit 14)   Authorization - Visit Number 14   Authorization - Number of Visits 24   PT Start Time 1122   PT Stop Time 1202   PT Time Calculation (min) 40 min   Equipment Utilized During Treatment Gait belt   Activity Tolerance Patient tolerated treatment well   Behavior During Therapy [facility] for tasks assessed/performed      Past Medical History  Diagnosis Date  . COPD (chronic obstructive pulmonary disease) ([place])   . Type 2 diabetes mellitus ([place])   . Hyperlipidemia   . Essential hypertension   . Iron deficiency anemia   . NASH (nonalcoholic steatohepatitis)   . Diabetic neuropathy ([place])   . GERD (gastroesophageal reflux disease)   . Arthritis   . Depression   . Diastolic dysfunction   . Left knee DJD   . Lumbar spinal stenosis   . DDD (degenerative disc disease), lumbosacral   . Peripheral edema   . Peripheral edema 10/05/2015    Past Surgical History  Procedure Laterality Date  . Hernia repair    . Shoulder surgery      Rght-rotator cuff  . Lipoma removal      Stomach  . Knee arthroscopy with medial menisectomy Left 11/06/2012    Procedure: KNEE ARTHROSCOPY WITH MEDIAL MENISECTOMY;  Surgeon: Carole Civil, MD;  Location: [facility];  Service: Orthopedics;  Laterality: Left;  . Lumbar laminectomy/decompression microdiscectomy Left 08/30/2013    Procedure: LUMBAR LAMINECTOMY/DECOMPRESSION MICRODISCECTOMY LEFT  LUMBAR TWO THREE;  Surgeon: Otilio Connors, MD;  Location: [facility];   Service: Neurosurgery;  Laterality: Left;  . Egd with enteroscopy  2011    [facility]: normal esophagus and stomach. Normal duodenum, jejunum. No evidence of AVMs.   . Colonoscopy  2011    [facility]: normal colon, normal distal ileum  . Colonoscopy with propofol N/A 07/21/2014    Procedure: ATTEMPTED COLONOSCOPY WITH PROPOFOL-HAD TO STOP DUE TO BRADYCARDIA;  Surgeon: Daneil Dolin, MD;  Location: [facility];  Service: Endoscopy;  Laterality: N/A;  . Esophagogastroduodenoscopy (egd) with propofol N/A 07/21/2014    Procedure: ESOPHAGOGASTRODUODENOSCOPY (EGD) WITH PROPOFOL;  Surgeon: Daneil Dolin, MD;  Location: [facility];  Service: Endoscopy;  Laterality: N/A;  . Biopsy N/A 07/21/2014    Procedure: BIOPSY;  Surgeon: Daneil Dolin, MD;  Location: [facility];  Service: Endoscopy;  Laterality: N/A;    There were no vitals filed for this visit.      Subjective Assessment - 04/03/16 1126    Subjective Patient arrives having some concerns over not being able to get his compression wraps on over the weekend. Patient continues to be motivated to continue with attempting to get back to walking and improving his mobility. He does well with transfers but is having quite a bit of trouble getting in and out of hospital bed, has been staying in left chair at night.    Pertinent History CHF, DM, HTN, S/P lumbar surgeryL2-3, lymphedema, COPD,  How long can you sit comfortably? no problem    How long can you stand comfortably? 7/5- stil about 2 minutes    How long can you walk comfortably? 7/5- not walking yet    Patient Stated Goals improve mobility, get back to walking    Currently in Pain? Yes   Pain Score 8    Pain Location Leg   Pain Orientation Right;Left   Pain Descriptors / Indicators Sharp   Pain Type Chronic pain   Pain Radiating Towards starts in hip and goes down legs    Pain Onset More than a month ago   Pain Frequency Constant   Aggravating Factors  weight bearing    Pain Relieving Factors  rest, ice    Effect of Pain on Daily Activities limited mobility             [facility] PT Assessment - 04/03/16 0001    Assessment   Medical Diagnosis strength and gait (referral for this already received from MD)   Referring Provider Dr. Vic Blackbird    Onset Date/Surgical Date 01/05/16   Prior Therapy home health   Precautions   Precautions Fall   Restrictions   Weight Bearing Restrictions No   Balance Screen   Has the patient fallen in the past 6 months No   Has the patient had a decrease in activity level because of a fear of falling?  Yes   Is the patient reluctant to leave their home because of a fear of falling?  Yes   [place] residence   Home Access Ramped entrance   Strength   Right Hip Flexion 2/5   Right Hip ABduction 2-/5   Left Hip Flexion 3-/5   Left Hip ABduction 2-/5   Right Knee Extension 3/5   Left Knee Extension 3/5   Right Ankle Dorsiflexion 4/5   Left Ankle Dorsiflexion 5/5   Bed Mobility   Supine to Sit 4: Min assist   Sit to Supine 4: Min assist                     [facility] Adult PT Treatment/Exercise - 04/03/16 0001    Transfers   Comments stand-pivot transfer Min guard    Ambulation/Gait   Gait Pattern Step-through pattern   Ambulation Surface Level   Gait Comments exteme flexed posture, reduced heel-toe pattern, reduced gait speed x12f min guard    Posture/Postural Control   Posture/Postural Control Postural limitations   Postural Limitations Rounded Shoulders;Forward head;Flexed trunk   Posture Comments knees flexed with standing                 PT Education - 04/03/16 1214    Education provided Yes   Education Details progress with skilled PT services, POC    Person(s) Educated Patient   Methods Explanation   Comprehension Verbalized understanding          PT Short Term Goals - 04/03/16 1218    PT SHORT TERM GOAL #1   Title Pt will have a  5 cm reduction in bilateral   for improved fitting of clothing    Baseline DC-ed from lymph PT    Status Achieved   PT SHORT TERM GOAL #2   Title Pt will be able to identify the signs and symptoms of cellulitis due to prolong lymphstasis   Baseline DC-ed from lymph PT    Time 1   Period Weeks   Status Achieved  PT SHORT TERM GOAL #3   Title Pt will have a 5 cm decrease at dorsum of foot to improve ability to don shoes    Baseline DC-ed from lymph PT    Time 3   Period Weeks   Status On-going   PT SHORT TERM GOAL #4   Title Pt will be I in remedial exercises to promote lymph circulaiton    Baseline DC-ed from lymph PT    Time 2   Period Weeks   Status Achieved   PT SHORT TERM GOAL #5   Title Pt to be able to complete bed mobility independently    Time 2   Period Weeks   Status On-going   PT SHORT TERM GOAL #6   Title Patient to be able to stand for five minutes for improved transfers and decreased risk of falling    Time 3   Period Weeks   Status On-going           PT Long Term Goals - 04/03/16 1219    PT LONG TERM GOAL #1   Title Pt will have a 7cm  reduction in fluid in bilateral LE  to promote independence of transfers. Pt to be able to transfer to bed and be able to lift his feet onto the bed on his own.    Baseline DC-ed from lymph PT    Time 6   Period Weeks   Status Achieved   PT LONG TERM GOAL #2   Title Pt to have acquired compression garment for bilateral  LE to allow maintance phase of treatment to begin    Time 6   Period Weeks   Status Achieved   PT LONG TERM GOAL #3   Title Pt to have no drainage from either LE to reduce risk of infection .   Time 3   Period Weeks   Status Achieved   PT LONG TERM GOAL #4   Title Pt life impact to decrease by at least 15 points to show improved physical/functional concerns    Time 6   Period Weeks   Status On-going   PT LONG TERM GOAL #5   Title Pt to be able to ambulate with walker for 25 feet to allow patient to walk in his apartment to  allow patient to go into the restroom when his aide is not present.    Time 6   Period Weeks   Status On-going               Plan - 04/03/16 1215    Clinical Impression Statement Re-Assessment performed today. Patient had been attending skilled PT services primarily for focus on lymphedema and fluid management, however has since been discharged from lymph/fluid care and skilled PT services are now to primarily focus on strength/gait/balance at this time. Upon examination, patient does not reveal significant functional changes in objective measures- however again, this is due to PT focus on lymph rather than orthopedic concerns, and PT expects these measures to have improved by next functional re-assessment. At this point recommend ongoing skilled PT services with focus on strength, balance, gait.    Rehab Potential Good   PT Frequency 2x / week   PT Duration 4 weeks   PT Treatment/Interventions ADLs/Self Care Home Management;Gait training;Functional mobility training;Therapeutic activities;Therapeutic exercise;Balance training;Neuromuscular re-education;Patient/family education;Manual techniques;Energy conservation   PT Next Visit Plan begin manual stretching of hip flexors on mat; strength, gait, balance    Consulted and Agree with Plan of Care  Patient      Patient will benefit from skilled therapeutic intervention in order to improve the following deficits and impairments:  Abnormal gait, Decreased balance, Decreased activity tolerance, Decreased range of motion, Decreased strength, Increased edema, Pain  Visit Diagnosis: Difficulty in walking, not elsewhere classified - Plan: PT plan of care cert/re-cert  Unsteadiness on feet - Plan: PT plan of care cert/re-cert  Muscle weakness (generalized) - Plan: PT plan of care cert/re-cert  Arthralgia of both lower legs - Plan: PT plan of care cert/re-cert       G-Codes - 04-27-2016 1220    Functional Assessment Tool Used skilled  clinical assessment of gait, functional mobility, strength   Functional Limitation Mobility: Walking and moving around   Mobility: Walking and Moving Around Current Status (Y8502) At least 60 percent but less than 80 percent impaired, limited or restricted   Mobility: Walking and Moving Around Goal Status (D7412) At least 40 percent but less than 60 percent impaired, limited or restricted      Problem List Patient Active Problem List   Diagnosis Date Noted  . Diastolic dysfunction 87/86/7672  . Stage II pressure ulcer of sacral region 02/13/2015  . Atypical nevi 01/18/2015  . Intertriginous candidiasis 12/23/2014  . Hypothyroidism 11/30/2014  . Bradycardia 07/21/2014  . Sinus pause 07/21/2014  . Liver fibrosis ([place]) 06/29/2014  . IDA (iron deficiency anemia) 06/29/2014  . Iron deficiency anemia 05/31/2014  . Hepatomegaly 05/25/2014  . Difficulty walking 05/03/2014  . Knee pain, chronic 05/03/2014  . Stiffness of joint, not elsewhere classified, pelvic region and thigh 05/03/2014  . Dermatitis 04/12/2014  . HNP (herniated nucleus pulposus), lumbar 08/30/2013  . Spinal stenosis of lumbar region 07/06/2013  . Carpal tunnel syndrome 04/13/2013  . Lymphedema 04/13/2013  . Status post arthroscopy of left knee 04/13/2013  . DDD (degenerative disc disease), lumbosacral 04/13/2013  . Peripheral neuropathy ([place]) 01/05/2013  . S/P arthroscopy of left knee 11/09/2012  . Osteoarthritis of left knee 10/27/2012  . Meniscus tear 10/07/2012  . Patellar tendonitis 07/01/2012  . NASH (nonalcoholic steatohepatitis) 05/22/2007  . Type II diabetes mellitus with neurological manifestations ([place]) 05/20/2007  . Hyperlipidemia 10/15/2006  . OBESITY, MORBID 10/15/2006  . Essential hypertension 10/15/2006  . GERD 10/15/2006    Deniece Ree PT, DPT [facility] [address] [place], [place], [address] Phone: [phone]   Fax:   [phone]  Name: NICHOLSON STARACE MRN: [id number] Date of Birth: Jun 16, 1946

## 2016-04-04 ENCOUNTER — Other Ambulatory Visit: Payer: Self-pay | Admitting: *Deleted

## 2016-04-04 ENCOUNTER — Encounter: Payer: Self-pay | Admitting: *Deleted

## 2016-04-04 NOTE — Patient Outreach (Signed)
[facility]) Care Management   04/04/2016  Michael [name] 11/18/1945 [id number]  Michael [name] is an 70 y.o. male with chronic diastolic heart failure with EF 60-65%, history of bradycardia, type II Diabetes, Hypertension, Iron Deficiency Anemia, NASH + Hepatomegaly, and degeneratove disc disease s/p surgery of L2-L3.   Michael [name] was admitted to the hospital in February with Acute/Chronic Diastolic Heart Failure and worsening lymphedema. Thereafter, he spent 21 days at [facility]. Michael [name] was referred to [facility] for assistance with management of CHF.   [facility] Care Management has been following Michael [name] in the community since his discharge from SNF. Focus of care management services has been around CHF and DM Disease management with particular attention to volume management and peripheral edema/lymphedema and lower extremity skin ailments.   Michael [name] has been attending outpatient PT at [facility] for treatment of lymphedema and he is very pleased with the improvement in his lower extremity swelling and skin condition. He is now transitioning to outpatient PT for strengthening,conditioning, and ambulation. He is very pleased about this transition.   Subjective: "I just feel really good about how much my weight is down and how much better my legs are doing!"  Objective:  BP 102/64 mmHg  Pulse 74  SpO2 97%  Review of Systems  Constitutional: Positive for weight loss.  HENT: Negative.   Eyes: Negative.   Respiratory: Negative.   Cardiovascular: Positive for leg swelling. Negative for chest pain and palpitations.       Mild pre-tibial edema bilateral feet  Gastrointestinal: Negative.   Genitourinary: Negative.   Musculoskeletal: Positive for myalgias. Negative for back pain and falls.       Minor "aches and pains"  Skin:       Leg wraps in place; clearly less edematous and dramatically decreased in diameter; mild pre-tibial edema still evidenced even  through compression hose; continued outpatient PT visits  Neurological: Negative.   Psychiatric/Behavioral: Negative.     Physical Exam  Constitutional: He is oriented to person, place, and time. He appears well-developed and well-nourished. He is active. He does not have a sickly appearance. He does not appear ill.  Cardiovascular: Normal rate, regular rhythm, S1 normal, S2 normal and normal heart sounds.  Exam reveals no gallop.   No murmur heard. Respiratory: Effort normal and breath sounds normal. He has no wheezes. He has no rhonchi. He has no rales.  GI: Soft. Bowel sounds are normal. There is no tenderness.  Musculoskeletal:       Right knee: He exhibits decreased range of motion.       Left knee: He exhibits decreased range of motion.  Neurological: He is alert and oriented to person, place, and time.  Skin: Skin is warm and dry. Abrasion noted.     Psychiatric: He has a normal mood and affect. His speech is normal and behavior is normal. Judgment and thought content normal. Cognition and memory are normal.  Patient reports he is "thrilled" about his progress with weight loss and skin improvement    Encounter Medications:   Outpatient Encounter Prescriptions as of 04/04/2016  Medication Sig  . ACCU-CHEK AVIVA PLUS test strip USE AS DIRECTED TWICE DAILY.  Marland Kitchen aspirin EC 81 MG tablet Take 81 mg by mouth daily.  . B-D ULTRAFINE III SHORT PEN 31G X 8 MM MISC USE AS DIRECTED DAILY WITH LANTUS.  Marland Kitchen benazepril (LOTENSIN) 40 MG tablet TAKE ONE TABLET BY MOUTH DAILY.  . bumetanide (  BUMEX) 2 MG tablet Take 1.5 tablets (3 mg total) by mouth 2 (two) times daily.  Marland Kitchen BYSTOLIC 10 MG tablet TAKE ONE TABLET BY MOUTH DAILY.  . clobetasol cream (TEMOVATE) 0.05 % APPLY TO AFFECTED AREA 2 TIMES DAILY.  . diazepam (VALIUM) 5 MG tablet TAKE 1 TABLET BY MOUTH AT BEDTIME AS NEEDED FOR ANXIETY.  . diclofenac sodium (VOLTAREN) 1 % GEL Apply 4 g topically 4 (four) times daily.  . Ferrous Sulfate (IRON) 325  (65 FE) MG TABS Take 1 tablet by mouth daily.   Marland Kitchen gabapentin (NEURONTIN) 400 MG capsule TAKE ONE CAPSULE BY MOUTH THREE TIMES DAILY.  Marland Kitchen LANTUS SOLOSTAR 100 UNIT/ML Solostar Pen INJECT 30 UNITS SUBCUTANEOUSLY AT BEDTIME.  Marland Kitchen levothyroxine (SYNTHROID, LEVOTHROID) 88 MCG tablet TAKE 1 TABLET BY MOUTH DAILY BEFORE BREAKFAST.  . Magnesium 400 MG TABS Take 400 mg by mouth 2 (two) times daily.  . meloxicam (MOBIC) 15 MG tablet TAKE ONE TABLET BY MOUTH DAILY WITH FOOD.  Marland Kitchen metFORMIN (GLUCOPHAGE) 1000 MG tablet TAKE ONE TABLET BY MOUTH TWICE DAILY WITH A MEAL.  Marland Kitchen metolazone (ZAROXOLYN) 2.5 MG tablet Take 1 tablet (2.5 mg total) by mouth daily.  Marland Kitchen nystatin cream (MYCOSTATIN) Apply 1 application topically 2 (two) times daily.  Marland Kitchen omeprazole (PRILOSEC) 20 MG capsule TAKE ONE CAPSULE BY MOUTH TWICE DAILY.  Marland Kitchen oxyCODONE-acetaminophen (PERCOCET) 7.5-325 MG tablet Take one tablet by mouth every 6 hours as needed for severe pain. Max APAP 3gm/24hrs from all sources  . potassium chloride (K-DUR) 10 MEQ tablet Take 10 mEq by mouth 2 (two) times daily.  . pravastatin (PRAVACHOL) 40 MG tablet TAKE ONE TABLET BY MOUTH DAILY.   Fall/Depression Screening:    PHQ 2/9 Scores 04/04/2016 12/28/2015 12/04/2015 03/01/2015 01/18/2015 02/08/2014 01/05/2013  PHQ - 2 Score 0 0 2 0 2 2 0  PHQ- 9 Score - - 4 - 8 8 -    Assessment:  70 year old gentleman living in [place] with CHF, DM, lower extremity lymphedema.   Chronic Health Condition (CHF)- Michael [name] weight on discharge from acute care setting was 195#. His weight quickly went up to as high as 231# after discharge. He is on an aggressive diuretic regimen and outpatient lymphedema treatment which has helped. Michael [name] weight is #196 on his last PT visit. He denies shortness of breath, chest pain, worsened fatigue, or any symptoms. Michael [name] is taking diuretics and all other medications as prescribed. He is seeing his cardiologist as scheduled.   Acute Health Condition (BLE  lymphedema) - Mr. Stamas lymphedema is drastically improved after outpatient treatment in the physical therapy department at [facility]. Today, he has only mild pre-tibial edema.   Acute Health Condition (interruption in skin integrity inferior gluteal folds) - Mr. Petrosino developed ulcers of the peri-anal skin, in the inferior gluteal folds, presumably from sitting for extended periods of time because of his severe lymphedema and lower extremity swelling. He had a home health nurse coming weekly to check his skin and change dressings. Mr. Buchberger reports that his skin healed completed and his home health nurse signed off. Today, I helped him cleanse the skin of this same area and noted only a small 1-2cm area of interruption in skin integrity which appeared to be a shear injury.   Chronic Health Condition (DM) - Mr. Raatz admittedly has not been as rigorous in his attention to Diabetes Management since he began having so much trouble with his legs. His most recent 30 day average  is 133 (n=14). He is taking medications as prescribed and checking his cbg's as recommended. He is refocusing his efforts on Diabetes management. We reviewed his prescribed carb modified diet.   Plan:   Mr. Normington will continue twice weekly visits to Outpatient PT for strengthening, conditioning, ambulation, balance, and gait training.   Mr. Kamaka will attend all scheduled provider appointments.   Mr. Lazenby will take all medications as prescribed and will check cbg's daily, recording and calling for findings outside established parameters.   Mr. Osmun will report new or worsening skin symptoms.  Mr. Rogue Bussing weigh himself daily and record, calling for weight gain of 3# overnight or 5# in a week, increased fatigue/swelling, or shortness of breath/chest pain, or any other new or worsened symptom.   I will see Mr. Plaskett at home next month for a routine visit and follow up of DM and lymphedema  management.    [facility]  [phone]

## 2016-04-07 ENCOUNTER — Other Ambulatory Visit: Payer: Self-pay | Admitting: Family Medicine

## 2016-04-08 ENCOUNTER — Ambulatory Visit (INDEPENDENT_AMBULATORY_CARE_PROVIDER_SITE_OTHER): Payer: Medicare Other | Admitting: Cardiology

## 2016-04-08 ENCOUNTER — Encounter: Payer: Self-pay | Admitting: Cardiology

## 2016-04-08 VITALS — BP 105/68 | HR 87 | Ht 60.0 in | Wt 205.0 lb

## 2016-04-08 DIAGNOSIS — I5032 Chronic diastolic (congestive) heart failure: Secondary | ICD-10-CM | POA: Diagnosis not present

## 2016-04-08 DIAGNOSIS — R600 Localized edema: Secondary | ICD-10-CM

## 2016-04-08 NOTE — Patient Instructions (Signed)
Your physician recommends that you schedule a follow-up appointment in: 3 months with Dr. Harl Bowie  Your physician recommends that you continue on your current medications as directed. Please refer to the Current Medication list given to you today.  Thank you for choosing [facility]!!

## 2016-04-08 NOTE — Telephone Encounter (Signed)
Refill appropriate and filled per protocol. 

## 2016-04-08 NOTE — Progress Notes (Signed)
Clinical Summary [name] is a 70 y.o.male  seen today for follow up of the following medical problems.   1. Chronic diastolic heart failure - echo 05/2015 with normal LVEF 60-65%, grade II diastolic dysfunction - Jan 2017 echo LVEF 33-35%, grade I diastolic dysfunction - Weight up 195 to 211 since hospital discharge. - edema likely combined diastolic HF and lymphedema  - weight trending up 196 lbs from 03/25/16, now up to 205 lbs.  - Na 122 by labs, though some question about collection error. He is awaiting repeat labs.  - overall stable LE edema     Past Medical History  Diagnosis Date  . COPD (chronic obstructive pulmonary disease) ([place])   . Type 2 diabetes mellitus ([place])   . Hyperlipidemia   . Essential hypertension   . Iron deficiency anemia   . NASH (nonalcoholic steatohepatitis)   . Diabetic neuropathy ([place])   . GERD (gastroesophageal reflux disease)   . Arthritis   . Depression   . Diastolic dysfunction   . Left knee DJD   . Lumbar spinal stenosis   . DDD (degenerative disc disease), lumbosacral   . Peripheral edema   . Peripheral edema 10/05/2015     No Known Allergies   Current Outpatient Prescriptions  Medication Sig Dispense Refill  . ACCU-CHEK AVIVA PLUS test strip USE AS DIRECTED TWICE DAILY. 100 each 0  . aspirin EC 81 MG tablet Take 81 mg by mouth daily.    . B-D ULTRAFINE III SHORT PEN 31G X 8 MM MISC USE AS DIRECTED DAILY WITH LANTUS. 100 each 0  . benazepril (LOTENSIN) 40 MG tablet TAKE ONE TABLET BY MOUTH DAILY. 90 tablet 0  . bumetanide (BUMEX) 2 MG tablet Take 1.5 tablets (3 mg total) by mouth 2 (two) times daily. 135 tablet 3  . BYSTOLIC 10 MG tablet TAKE ONE TABLET BY MOUTH DAILY. 30 tablet 6  . clobetasol cream (TEMOVATE) 0.05 % APPLY TO AFFECTED AREA 2 TIMES DAILY. 45 g 11  . diazepam (VALIUM) 5 MG tablet TAKE 1 TABLET BY MOUTH AT BEDTIME AS NEEDED FOR ANXIETY. 30 tablet 2  . diclofenac sodium (VOLTAREN) 1 % GEL Apply 4 g topically 4  (four) times daily. 5 Tube 5  . Ferrous Sulfate (IRON) 325 (65 FE) MG TABS Take 1 tablet by mouth daily.     Marland Kitchen gabapentin (NEURONTIN) 400 MG capsule TAKE ONE CAPSULE BY MOUTH THREE TIMES DAILY. 90 capsule 3  . LANTUS SOLOSTAR 100 UNIT/ML Solostar Pen INJECT 30 UNITS SUBCUTANEOUSLY AT BEDTIME. 15 mL 3  . levothyroxine (SYNTHROID, LEVOTHROID) 88 MCG tablet TAKE 1 TABLET BY MOUTH DAILY BEFORE BREAKFAST. 30 tablet 3  . Magnesium 400 MG TABS Take 400 mg by mouth 2 (two) times daily. 60 tablet 6  . meloxicam (MOBIC) 15 MG tablet TAKE ONE TABLET BY MOUTH DAILY WITH FOOD. 30 tablet 3  . metFORMIN (GLUCOPHAGE) 1000 MG tablet TAKE ONE TABLET BY MOUTH TWICE DAILY WITH A MEAL. 180 tablet 3  . metolazone (ZAROXOLYN) 2.5 MG tablet Take 1 tablet (2.5 mg total) by mouth daily. 30 tablet 3  . nystatin cream (MYCOSTATIN) Apply 1 application topically 2 (two) times daily. 45 g 1  . omeprazole (PRILOSEC) 20 MG capsule TAKE ONE CAPSULE BY MOUTH TWICE DAILY. 60 capsule 11  . oxyCODONE-acetaminophen (PERCOCET) 7.5-325 MG tablet Take one tablet by mouth every 6 hours as needed for severe pain. Max APAP 3gm/24hrs from all sources 120 tablet 0  . potassium  chloride (K-DUR) 10 MEQ tablet Take 10 mEq by mouth 2 (two) times daily.    . pravastatin (PRAVACHOL) 40 MG tablet TAKE ONE TABLET BY MOUTH DAILY. 90 tablet 0   No current facility-administered medications for this visit.     Past Surgical History  Procedure Laterality Date  . Hernia repair    . Shoulder surgery      Rght-rotator cuff  . Lipoma removal      Stomach  . Knee arthroscopy with medial menisectomy Left 11/06/2012    Procedure: KNEE ARTHROSCOPY WITH MEDIAL MENISECTOMY;  Surgeon: Carole Civil, MD;  Location: [facility];  Service: Orthopedics;  Laterality: Left;  . Lumbar laminectomy/decompression microdiscectomy Left 08/30/2013    Procedure: LUMBAR LAMINECTOMY/DECOMPRESSION MICRODISCECTOMY LEFT  LUMBAR TWO THREE;  Surgeon: Otilio Connors, MD;  Location:  [facility];  Service: Neurosurgery;  Laterality: Left;  . Egd with enteroscopy  2011    [facility]: normal esophagus and stomach. Normal duodenum, jejunum. No evidence of AVMs.   . Colonoscopy  2011    [facility]: normal colon, normal distal ileum  . Colonoscopy with propofol N/A 07/21/2014    Procedure: ATTEMPTED COLONOSCOPY WITH PROPOFOL-HAD TO STOP DUE TO BRADYCARDIA;  Surgeon: Daneil Dolin, MD;  Location: [facility];  Service: Endoscopy;  Laterality: N/A;  . Esophagogastroduodenoscopy (egd) with propofol N/A 07/21/2014    Procedure: ESOPHAGOGASTRODUODENOSCOPY (EGD) WITH PROPOFOL;  Surgeon: Daneil Dolin, MD;  Location: [facility];  Service: Endoscopy;  Laterality: N/A;  . Biopsy N/A 07/21/2014    Procedure: BIOPSY;  Surgeon: Daneil Dolin, MD;  Location: [facility];  Service: Endoscopy;  Laterality: N/A;     No Known Allergies    Family History  Problem Relation Age of Onset  . Heart disease Mother   . Hyperlipidemia Mother   . Hypertension Mother   . Depression Mother   . Diabetes Mother   . Rectal cancer Mother   . Cancer Mother   . Heart disease Father   . Hypertension Father   . Hyperlipidemia Father   . Diabetes Father   . Cancer Father   . Heart disease Sister   . Hyperlipidemia Sister   . Hypertension Sister   . Diabetes Sister   . Diabetes Brother   . Heart disease Sister   . Hyperlipidemia Sister   . Hypertension Sister   . Heart disease Sister   . Hyperlipidemia Sister   . Hypertension Sister   . Diabetes Sister   . Diabetes Brother      Social History Mr. Fjeld reports that he quit smoking about 16 years ago. His smoking use included Cigarettes. He started smoking about 60 years ago. He has a 110 pack-year smoking history. He has never used smokeless tobacco. Mr. Blankenburg reports that he does not drink alcohol.   Review of Systems CONSTITUTIONAL: No weight loss, fever, chills, weakness or fatigue.  HEENT: Eyes: No visual loss, blurred vision, double  vision or yellow sclerae.No hearing loss, sneezing, congestion, runny nose or sore throat.  SKIN: No rash or itching.  CARDIOVASCULAR: no chest pain, no palpitations RESPIRATORY: No shortness of breath, cough or sputum.  GASTROINTESTINAL: No anorexia, nausea, vomiting or diarrhea. No abdominal pain or blood.  GENITOURINARY: No burning on urination, no polyuria NEUROLOGICAL: No headache, dizziness, syncope, paralysis, ataxia, numbness or tingling in the extremities. No change in bowel or bladder control.  MUSCULOSKELETAL: No muscle, back pain, joint pain or stiffness.  LYMPHATICS: No enlarged nodes. No history of  splenectomy.  PSYCHIATRIC: No history of depression or anxiety.  ENDOCRINOLOGIC: No reports of sweating, cold or heat intolerance. No polyuria or polydipsia.  Marland Kitchen   Physical Examination Filed Vitals:   04/08/16 1001  BP: 105/68  Pulse: 87   Filed Vitals:   04/08/16 1001  Height: 5' (1.524 m)  Weight: 205 lb (92.987 kg)    Gen: resting comfortably, no acute distress HEENT: no scleral icterus, pupils equal round and reactive, no palptable cervical adenopathy,  CV: RRR, no m/r/g, no jvd Resp: Clear to auscultation bilaterally GI: abdomen is soft, non-tender, non-distended, normal bowel sounds, no hepatosplenomegaly MSK: extremities are warm, 2+ bilateral LE edema Skin: warm, no rash Neuro:  no focal deficits Psych: appropriate affect   Diagnostic Studies 05/2015 echo Study Conclusions  - Left ventricle: The cavity size was normal. Wall thickness was increased in a pattern of mild LVH. Systolic function was normal. The estimated ejection fraction was in the range of 60% to 65%. Wall motion was normal; there were no regional wall motion abnormalities. Features are consistent with a pseudonormal left ventricular filling pattern, with concomitant abnormal relaxation and increased filling pressure (grade 2 diastolic dysfunction). Doppler parameters are  consistent with high ventricular filling pressure. - Aortic valve: Mildly calcified annulus. - Mitral valve: Mildly calcified annulus. There was trivial regurgitation. - Left atrium: The atrium was mildly to moderately dilated. Volume/bsa, S: 34.4 ml/m^2. Volume/bsa, ES (1-plane Simpson&'s, A4C): 31.3 ml/m^2.  Jan 2017 Study Conclusions  - Left ventricle: The cavity size was mildly dilated. Wall thickness was increased in a pattern of mild LVH. Systolic function was normal. The estimated ejection fraction was in the range of 50% to 55%. Wall motion was normal; there were no regional wall motion abnormalities. Doppler parameters are consistent with abnormal left ventricular relaxation (grade 1 diastolic dysfunction). - Mitral valve: Calcified annulus. Mildly thickened leaflets . - Atrial septum: No defect or patent foramen ovale was identified.    Assessment and Plan   1. LE edema/Chronic diastolic HF - likely combination of lymphedema and acute on chronic diastolic HF - fluid status has been very labile, management has been complicated by the lack of response to aggressive diuretics - currently on bumex 56m bid and metolazone 2.545mdaily. Some increase in weight and edema, will await repeat labs for his questionable sodium level and consider increasing his metolazone for a few days     JoArnoldo LenisM.D.

## 2016-04-09 ENCOUNTER — Ambulatory Visit (HOSPITAL_COMMUNITY): Payer: Medicare Other | Admitting: Physical Therapy

## 2016-04-09 DIAGNOSIS — R2681 Unsteadiness on feet: Secondary | ICD-10-CM | POA: Diagnosis not present

## 2016-04-09 DIAGNOSIS — E1140 Type 2 diabetes mellitus with diabetic neuropathy, unspecified: Secondary | ICD-10-CM | POA: Diagnosis not present

## 2016-04-09 DIAGNOSIS — R262 Difficulty in walking, not elsewhere classified: Secondary | ICD-10-CM | POA: Diagnosis not present

## 2016-04-09 DIAGNOSIS — M25562 Pain in left knee: Secondary | ICD-10-CM | POA: Diagnosis not present

## 2016-04-09 DIAGNOSIS — M6281 Muscle weakness (generalized): Secondary | ICD-10-CM

## 2016-04-09 DIAGNOSIS — M25561 Pain in right knee: Secondary | ICD-10-CM

## 2016-04-09 DIAGNOSIS — E1151 Type 2 diabetes mellitus with diabetic peripheral angiopathy without gangrene: Secondary | ICD-10-CM | POA: Diagnosis not present

## 2016-04-09 NOTE — Therapy (Signed)
[facility] [place], [place], [address] Phone: [phone]   Fax:  [phone]  Physical Therapy Treatment  Patient Details  Name: Michael [name] MRN: [id number] Date of Birth: 08-27-1946 Referring Provider: Dr. Vic Blackbird   Encounter Date: 04/09/2016      PT End of Session - 04/09/16 1739    Visit Number 15   Number of Visits 22   Date for PT Re-Evaluation 05/01/16   Authorization Type [employer] medicare (gcode on visit 14)   Authorization - Visit Number 15   Authorization - Number of Visits 24   PT Start Time [phone]  arrived late    PT Stop Time 1030   PT Time Calculation (min) 34 min   Equipment Utilized During Treatment Gait belt   Activity Tolerance Patient tolerated treatment well   Behavior During Therapy [facility] for tasks assessed/performed      Past Medical History  Diagnosis Date  . COPD (chronic obstructive pulmonary disease) ([place])   . Type 2 diabetes mellitus ([place])   . Hyperlipidemia   . Essential hypertension   . Iron deficiency anemia   . NASH (nonalcoholic steatohepatitis)   . Diabetic neuropathy ([place])   . GERD (gastroesophageal reflux disease)   . Arthritis   . Depression   . Diastolic dysfunction   . Left knee DJD   . Lumbar spinal stenosis   . DDD (degenerative disc disease), lumbosacral   . Peripheral edema   . Peripheral edema 10/05/2015    Past Surgical History  Procedure Laterality Date  . Hernia repair    . Shoulder surgery      Rght-rotator cuff  . Lipoma removal      Stomach  . Knee arthroscopy with medial menisectomy Left 11/06/2012    Procedure: KNEE ARTHROSCOPY WITH MEDIAL MENISECTOMY;  Surgeon: Carole Civil, MD;  Location: [facility];  Service: Orthopedics;  Laterality: Left;  . Lumbar laminectomy/decompression microdiscectomy Left 08/30/2013    Procedure: LUMBAR LAMINECTOMY/DECOMPRESSION MICRODISCECTOMY LEFT  LUMBAR TWO THREE;  Surgeon: Otilio Connors, MD;  Location: [place] [facility];   Service: Neurosurgery;  Laterality: Left;  . Egd with enteroscopy  2011    [facility]: normal esophagus and stomach. Normal duodenum, jejunum. No evidence of AVMs.   . Colonoscopy  2011    [facility]: normal colon, normal distal ileum  . Colonoscopy with propofol N/A 07/21/2014    Procedure: ATTEMPTED COLONOSCOPY WITH PROPOFOL-HAD TO STOP DUE TO BRADYCARDIA;  Surgeon: Daneil Dolin, MD;  Location: [facility];  Service: Endoscopy;  Laterality: N/A;  . Esophagogastroduodenoscopy (egd) with propofol N/A 07/21/2014    Procedure: ESOPHAGOGASTRODUODENOSCOPY (EGD) WITH PROPOFOL;  Surgeon: Daneil Dolin, MD;  Location: [facility];  Service: Endoscopy;  Laterality: N/A;  . Biopsy N/A 07/21/2014    Procedure: BIOPSY;  Surgeon: Daneil Dolin, MD;  Location: [facility];  Service: Endoscopy;  Laterality: N/A;    There were no vitals filed for this visit.      Subjective Assessment - 04/09/16 0958    Subjective (p) Patient arrives today reporting he is having some pain in his hips today, he took his wraps off on Friday as his niece was going to help him put them on on Saturday, but then she cancelled but he reports that his legs are still feeeling OK. No falls or close calls since last time he came.    Pertinent History (p) CHF, DM, HTN, S/P lumbar surgeryL2-3, lymphedema, COPD,  Currently in Pain? (p) Yes   Pain Score (p) 7    Pain Location (p) Leg   Pain Orientation (p) Right;Left   Pain Descriptors / Indicators (p) Stabbing   Pain Type (p) Chronic pain   Pain Radiating Towards (p) going down legs from hips    Pain Onset (p) More than a month ago   Pain Frequency (p) Constant   Aggravating Factors  (p) moving a certain way    Pain Relieving Factors (p) sitting down, resting but standing can also help                          [facility] Adult PT Treatment/Exercise - 04/09/16 0001    Ambulation/Gait   Gait Comments gait forwards and backears in parallel bars 2x36f, min guard    Lumbar  Exercises: Stretches   Active Hamstring Stretch 2 reps;30 seconds   Active Hamstring Stretch Limitations supine    Lumbar Exercises: Seated   Long Arc Quad on Chair Both;1 set;10 reps   Other Seated Lumbar Exercises manually resisted clamshells in sitting 1x10   Lumbar Exercises: Supine   Bridge 10 reps                PT Education - 04/09/16 1739    Education provided Yes   Education Details bring personal walker next session    Person(s) Educated Patient   Methods Explanation   Comprehension Verbalized understanding          PT Short Term Goals - 04/03/16 1218    PT SHORT TERM GOAL #1   Title Pt will have a  5 cm reduction in bilateral  for improved fitting of clothing    Baseline DC-ed from lymph PT    Status Achieved   PT SHORT TERM GOAL #2   Title Pt will be able to identify the signs and symptoms of cellulitis due to prolong lymphstasis   Baseline DC-ed from lymph PT    Time 1   Period Weeks   Status Achieved   PT SHORT TERM GOAL #3   Title Pt will have a 5 cm decrease at dorsum of foot to improve ability to don shoes    Baseline DC-ed from lymph PT    Time 3   Period Weeks   Status On-going   PT SHORT TERM GOAL #4   Title Pt will be I in remedial exercises to promote lymph circulaiton    Baseline DC-ed from lymph PT    Time 2   Period Weeks   Status Achieved   PT SHORT TERM GOAL #5   Title Pt to be able to complete bed mobility independently    Time 2   Period Weeks   Status On-going   PT SHORT TERM GOAL #6   Title Patient to be able to stand for five minutes for improved transfers and decreased risk of falling    Time 3   Period Weeks   Status On-going           PT Long Term Goals - 04/03/16 1219    PT LONG TERM GOAL #1   Title Pt will have a 7cm  reduction in fluid in bilateral LE  to promote independence of transfers. Pt to be able to transfer to bed and be able to lift his feet onto the bed on his own.    Baseline DC-ed from lymph PT     Time 6   Period  Weeks   Status Achieved   PT LONG TERM GOAL #2   Title Pt to have acquired compression garment for bilateral  LE to allow maintance phase of treatment to begin    Time 6   Period Weeks   Status Achieved   PT LONG TERM GOAL #3   Title Pt to have no drainage from either LE to reduce risk of infection .   Time 3   Period Weeks   Status Achieved   PT LONG TERM GOAL #4   Title Pt life impact to decrease by at least 15 points to show improved physical/functional concerns    Time 6   Period Weeks   Status On-going   PT LONG TERM GOAL #5   Title Pt to be able to ambulate with walker for 25 feet to allow patient to walk in his apartment to allow patient to go into the restroom when his aide is not present.    Time 6   Period Weeks   Status On-going               Plan - 04/09/16 1740    Clinical Impression Statement Patient arrived late for today's session. Began with gait forward and backwards in parallel bars with min guard; attempted gait outside of bars with regular walker but too tall for patient and educated him to bring personal walker next time for gait in open space. Otherwise performed functional strengthening and stretching techniques today.    Rehab Potential Good   PT Frequency 2x / week   PT Duration 4 weeks   PT Treatment/Interventions ADLs/Self Care Home Management;Gait training;Functional mobility training;Therapeutic activities;Therapeutic exercise;Balance training;Neuromuscular re-education;Patient/family education;Manual techniques;Energy conservation   PT Next Visit Plan begin manual stretching of hip flexors on mat; strength, gait, balance    Consulted and Agree with Plan of Care Patient      Patient will benefit from skilled therapeutic intervention in order to improve the following deficits and impairments:  Abnormal gait, Decreased balance, Decreased activity tolerance, Decreased range of motion, Decreased strength, Increased edema,  Pain  Visit Diagnosis: Difficulty in walking, not elsewhere classified  Unsteadiness on feet  Muscle weakness (generalized)  Arthralgia of both lower legs     Problem List Patient Active Problem List   Diagnosis Date Noted  . Diastolic dysfunction 57/26/2035  . Stage II pressure ulcer of sacral region 02/13/2015  . Atypical nevi 01/18/2015  . Intertriginous candidiasis 12/23/2014  . Hypothyroidism 11/30/2014  . Bradycardia 07/21/2014  . Sinus pause 07/21/2014  . Liver fibrosis ([place]) 06/29/2014  . IDA (iron deficiency anemia) 06/29/2014  . Iron deficiency anemia 05/31/2014  . Hepatomegaly 05/25/2014  . Difficulty walking 05/03/2014  . Knee pain, chronic 05/03/2014  . Stiffness of joint, not elsewhere classified, pelvic region and thigh 05/03/2014  . Dermatitis 04/12/2014  . HNP (herniated nucleus pulposus), lumbar 08/30/2013  . Spinal stenosis of lumbar region 07/06/2013  . Carpal tunnel syndrome 04/13/2013  . Lymphedema 04/13/2013  . Status post arthroscopy of left knee 04/13/2013  . DDD (degenerative disc disease), lumbosacral 04/13/2013  . Peripheral neuropathy ([place]) 01/05/2013  . S/P arthroscopy of left knee 11/09/2012  . Osteoarthritis of left knee 10/27/2012  . Meniscus tear 10/07/2012  . Patellar tendonitis 07/01/2012  . NASH (nonalcoholic steatohepatitis) 05/22/2007  . Type II diabetes mellitus with neurological manifestations ([place]) 05/20/2007  . Hyperlipidemia 10/15/2006  . OBESITY, MORBID 10/15/2006  . Essential hypertension 10/15/2006  . GERD 10/15/2006    Deniece Ree PT, DPT  [facility] [address] [place], [place], [address] Phone: [phone]   Fax:  [phone]  Name: Michael [name] MRN: [id number] Date of Birth: 12-Feb-1946

## 2016-04-10 ENCOUNTER — Other Ambulatory Visit: Payer: Self-pay | Admitting: Family Medicine

## 2016-04-10 ENCOUNTER — Telehealth: Payer: Self-pay | Admitting: Family Medicine

## 2016-04-10 DIAGNOSIS — E1149 Type 2 diabetes mellitus with other diabetic neurological complication: Secondary | ICD-10-CM | POA: Diagnosis not present

## 2016-04-10 DIAGNOSIS — I10 Essential (primary) hypertension: Secondary | ICD-10-CM | POA: Diagnosis not present

## 2016-04-10 LAB — COMPREHENSIVE METABOLIC PANEL
ALT: 18 U/L (ref 9–46)
AST: 17 U/L (ref 10–35)
Albumin: 4.1 g/dL (ref 3.6–5.1)
Alkaline Phosphatase: 62 U/L (ref 40–115)
BUN: 60 mg/dL — ABNORMAL HIGH (ref 7–25)
CALCIUM: 9.5 mg/dL (ref 8.6–10.3)
CO2: 30 mmol/L (ref 20–31)
Chloride: 92 mmol/L — ABNORMAL LOW (ref 98–110)
Creat: 1.46 mg/dL — ABNORMAL HIGH (ref 0.70–1.25)
GLUCOSE: 144 mg/dL — AB (ref 70–99)
POTASSIUM: 4.6 mmol/L (ref 3.5–5.3)
Sodium: 134 mmol/L — ABNORMAL LOW (ref 135–146)
Total Bilirubin: 0.4 mg/dL (ref 0.2–1.2)
Total Protein: 6.3 g/dL (ref 6.1–8.1)

## 2016-04-10 LAB — CBC WITH DIFFERENTIAL/PLATELET
BASOS ABS: 45 {cells}/uL (ref 0–200)
Basophils Relative: 1 %
Eosinophils Absolute: 180 cells/uL (ref 15–500)
Eosinophils Relative: 4 %
HEMATOCRIT: 28.6 % — AB (ref 38.5–50.0)
HEMOGLOBIN: 9.7 g/dL — AB (ref 13.0–17.0)
LYMPHS ABS: 1035 {cells}/uL (ref 850–3900)
Lymphocytes Relative: 23 %
MCH: 28.6 pg (ref 27.0–33.0)
MCHC: 33.9 g/dL (ref 32.0–36.0)
MCV: 84.4 fL (ref 80.0–100.0)
MONO ABS: 450 {cells}/uL (ref 200–950)
MPV: 8.9 fL (ref 7.5–12.5)
Monocytes Relative: 10 %
NEUTROS ABS: 2790 {cells}/uL (ref 1500–7800)
NEUTROS PCT: 62 %
Platelets: 243 10*3/uL (ref 140–400)
RBC: 3.39 MIL/uL — ABNORMAL LOW (ref 4.20–5.80)
RDW: 14.3 % (ref 11.0–15.0)
WBC: 4.5 10*3/uL (ref 3.8–10.8)

## 2016-04-10 LAB — LIPID PANEL
CHOL/HDL RATIO: 3 ratio (ref <=5.0)
Cholesterol: 116 mg/dL — ABNORMAL LOW (ref 125–200)
HDL: 39 mg/dL — AB (ref >=40)
LDL CALC: 55 mg/dL (ref <130)
Triglycerides: 108 mg/dL (ref <150)
VLDL: 22 mg/dL (ref <30)

## 2016-04-11 ENCOUNTER — Telehealth (HOSPITAL_COMMUNITY): Payer: Self-pay

## 2016-04-11 ENCOUNTER — Ambulatory Visit (HOSPITAL_COMMUNITY): Payer: Medicare Other | Admitting: Physical Therapy

## 2016-04-11 LAB — HEMOGLOBIN A1C
HEMOGLOBIN A1C: 6.7 % — AB (ref <5.7)
Mean Plasma Glucose: 146 mg/dL

## 2016-04-11 NOTE — Telephone Encounter (Signed)
04/11/16 cx said he has had upset stomach all night

## 2016-04-13 DIAGNOSIS — L89312 Pressure ulcer of right buttock, stage 2: Secondary | ICD-10-CM | POA: Diagnosis not present

## 2016-04-15 ENCOUNTER — Other Ambulatory Visit: Payer: Self-pay | Admitting: *Deleted

## 2016-04-15 ENCOUNTER — Telehealth: Payer: Self-pay | Admitting: Family Medicine

## 2016-04-15 DIAGNOSIS — M7650 Patellar tendinitis, unspecified knee: Secondary | ICD-10-CM

## 2016-04-15 NOTE — Patient Outreach (Signed)
[facility]) Care Management  04/15/2016  RYSON BACHA March 14, 1946 [id number]   I returned a call to Mr. Hopfensperger today at his request. He is concerned about a what he feels is a "heat rash from sweating...around the groin area."  He says he has had this before and it was treated with an ointment that gave him relief from the irritation and resolved the rash. I reached out to Dr. Buelah Manis to provide the information about Mr. Lunden symptoms and will follow up with him upon further discussion with Dr. Buelah Manis.   Plan: I will follow up with Mr. Holleman by phone.    [place] Management  [phone]

## 2016-04-15 NOTE — Telephone Encounter (Signed)
Pt is requesting a call back regarding a prescription asap. No other information was given. [phone]

## 2016-04-16 ENCOUNTER — Ambulatory Visit (HOSPITAL_COMMUNITY): Payer: Medicare Other | Admitting: Physical Therapy

## 2016-04-16 ENCOUNTER — Telehealth: Payer: Self-pay | Admitting: Family Medicine

## 2016-04-16 MED ORDER — NYSTATIN 100000 UNIT/GM EX CREA: 1.0000 "application " | TOPICAL_CREAM | Freq: Two times a day (BID) | CUTANEOUS | Status: DC

## 2016-04-16 MED ORDER — DICLOFENAC SODIUM 1 % TD GEL: 4.0000 g | Freq: Four times a day (QID) | TRANSDERMAL | Status: DC

## 2016-04-16 NOTE — Telephone Encounter (Signed)
Call placed to patient.   Requesting refill on Voltaren Gel.   Prescription sent to pharmacy.

## 2016-04-16 NOTE — Telephone Encounter (Signed)
-----   Message from Clerance Lav, RN sent at 04/15/2016  5:27 PM EDT ----- Hi Dr. Buelah Manis,   I received a call from Mr. Kihara this afternoon. He is describing to me a "rash in the groin area" that sounds like the intertriginous candidiasis you described in your note from 11/2014. He kept an ointment tube that he says was used to treat this when he was in the skilled nursing facility or thereafter and read the label to me: Diclofenac gel 1%. He says this cleared up the area of rash the last time. I told him that I would pass along this information to you to ask your advice.   I'm happy to follow up with Mr. Kenley or will watch his chart for any notes from your staff re: follow up with him.   Thank you,  [facility] Care Management  [phone]

## 2016-04-16 NOTE — Telephone Encounter (Signed)
I sent in nystatin cream Please let pt know use twice a day

## 2016-04-16 NOTE — Telephone Encounter (Signed)
Call placed to patient and patient made aware.  

## 2016-04-18 ENCOUNTER — Encounter (HOSPITAL_COMMUNITY): Payer: Medicare Other | Admitting: Physical Therapy

## 2016-04-20 ENCOUNTER — Other Ambulatory Visit: Payer: Self-pay | Admitting: Family Medicine

## 2016-04-22 ENCOUNTER — Telehealth: Payer: Self-pay | Admitting: Family Medicine

## 2016-04-22 NOTE — Telephone Encounter (Signed)
Cal placed to [facility] ([phone], Ext: [phone]).   [place].

## 2016-04-22 NOTE — Telephone Encounter (Signed)
Michael [name] w/ [facility] is calling on behalf of the patient to request a lift chair. She would like for the orders to be faxed to [phone] attn: Cathleen Corti, Linna Hoff

## 2016-04-22 NOTE — Telephone Encounter (Signed)
Refill appropriate and filled per protocol. 

## 2016-04-23 ENCOUNTER — Encounter (HOSPITAL_COMMUNITY): Payer: Medicare Other | Admitting: Physical Therapy

## 2016-04-24 ENCOUNTER — Telehealth: Payer: Self-pay | Admitting: Family Medicine

## 2016-04-24 MED ORDER — OXYCODONE-ACETAMINOPHEN 7.5-325 MG PO TABS
ORAL_TABLET | ORAL | 0 refills | Status: DC
Start: 2016-04-24 — End: 2016-05-22

## 2016-04-24 NOTE — Telephone Encounter (Signed)
okay

## 2016-04-24 NOTE — Telephone Encounter (Signed)
Okay to send in

## 2016-04-24 NOTE — Telephone Encounter (Signed)
Prescription printed and patient made aware to come to office to pick up after 4 pm on 04/24/2016.

## 2016-04-24 NOTE — Telephone Encounter (Signed)
Patient is calling to get rx for oxycodone  [phone]

## 2016-04-24 NOTE — Telephone Encounter (Signed)
Prescription printed and faxed.

## 2016-04-24 NOTE — Telephone Encounter (Signed)
Jeani Hawking from [facility] returned call.   Reports that patient is requesting recliner with lift apparatus. States that patient is aware that Medicare will cover lift, but not chair.   [facility] can give discounts and remove taxes from order if rx is given.   Ok to send order?

## 2016-04-24 NOTE — Telephone Encounter (Signed)
Ok to refill??  Last office visit/ refill 03/25/2016.

## 2016-04-24 NOTE — Telephone Encounter (Signed)
[facility]

## 2016-04-25 ENCOUNTER — Ambulatory Visit (HOSPITAL_COMMUNITY): Payer: Medicare Other

## 2016-04-25 ENCOUNTER — Telehealth: Payer: Self-pay | Admitting: *Deleted

## 2016-04-25 DIAGNOSIS — R2681 Unsteadiness on feet: Secondary | ICD-10-CM | POA: Diagnosis not present

## 2016-04-25 DIAGNOSIS — R262 Difficulty in walking, not elsewhere classified: Secondary | ICD-10-CM

## 2016-04-25 DIAGNOSIS — M25562 Pain in left knee: Secondary | ICD-10-CM

## 2016-04-25 DIAGNOSIS — M6281 Muscle weakness (generalized): Secondary | ICD-10-CM | POA: Diagnosis not present

## 2016-04-25 DIAGNOSIS — M25561 Pain in right knee: Secondary | ICD-10-CM | POA: Diagnosis not present

## 2016-04-25 NOTE — Therapy (Signed)
[place], [place], [address] Phone: [phone]   Fax:  [phone]  Physical Therapy Treatment  Patient Details  Name: Michael [name] MRN: [id number] Date of Birth: 09-16-1946 Referring Provider: Dr. Vic Blackbird   Encounter Date: 04/25/2016      PT End of Session - 04/25/16 1040    Visit Number 16   Number of Visits 22   Date for PT Re-Evaluation 05/01/16   Authorization Type UHC medicare (gcode on visit 14)   Authorization - Visit Number [id number]   Authorization - Number of Visits 24   PT Start Time 6812   PT Stop Time 1118   PT Time Calculation (min) 43 min   Equipment Utilized During Treatment Gait belt   Activity Tolerance Patient tolerated treatment well;Patient limited by pain   Behavior During Therapy WFL for tasks assessed/performed      Past Medical History:  Diagnosis Date  . Arthritis   . COPD (chronic obstructive pulmonary disease) ([place])   . DDD (degenerative disc disease), lumbosacral   . Depression   . Diabetic neuropathy ([place])   . Diastolic dysfunction   . Essential hypertension   . GERD (gastroesophageal reflux disease)   . Hyperlipidemia   . Iron deficiency anemia   . Left knee DJD   . Lumbar spinal stenosis   . NASH (nonalcoholic steatohepatitis)   . Peripheral edema   . Peripheral edema 10/05/2015  . Type 2 diabetes mellitus ([place])     Past Surgical History:  Procedure Laterality Date  . BIOPSY N/A 07/21/2014   Procedure: BIOPSY;  Surgeon: Daneil Dolin, MD;  Location: [facility];  Service: Endoscopy;  Laterality: N/A;  . COLONOSCOPY  2011   [place]: normal colon, normal distal ileum  . COLONOSCOPY WITH PROPOFOL N/A 07/21/2014   Procedure: ATTEMPTED COLONOSCOPY WITH PROPOFOL-HAD TO STOP DUE TO BRADYCARDIA;  Surgeon: Daneil Dolin, MD;  Location: [facility];  Service: Endoscopy;  Laterality: N/A;  . EGD with enteroscopy  2011   [facility]: normal esophagus and stomach. Normal  duodenum, jejunum. No evidence of AVMs.   . ESOPHAGOGASTRODUODENOSCOPY (EGD) WITH PROPOFOL N/A 07/21/2014   Procedure: ESOPHAGOGASTRODUODENOSCOPY (EGD) WITH PROPOFOL;  Surgeon: Daneil Dolin, MD;  Location: [facility];  Service: Endoscopy;  Laterality: N/A;  . HERNIA REPAIR    . KNEE ARTHROSCOPY WITH MEDIAL MENISECTOMY Left 11/06/2012   Procedure: KNEE ARTHROSCOPY WITH MEDIAL MENISECTOMY;  Surgeon: Carole Civil, MD;  Location: [facility];  Service: Orthopedics;  Laterality: Left;  . Lipoma removal     Stomach  . LUMBAR LAMINECTOMY/DECOMPRESSION MICRODISCECTOMY Left 08/30/2013   Procedure: LUMBAR LAMINECTOMY/DECOMPRESSION MICRODISCECTOMY LEFT  LUMBAR TWO THREE;  Surgeon: Otilio Connors, MD;  Location: [facility];  Service: Neurosurgery;  Laterality: Left;  . SHOULDER SURGERY     Rght-rotator cuff    There were no vitals filed for this visit.      Subjective Assessment - 04/25/16 1032    Subjective Pt stated he has Bil knee pain Rt> Lt scale 8/10.  Reports he has been doing the seated exercises and standing by sink minimal.   Pertinent History CHF, DM, HTN, S/P lumbar surgeryL2-3, lymphedema, COPD,    Patient Stated Goals improve mobility, get back to walking    Currently in Pain? Yes   Pain Score 8    Pain Location Leg   Pain Orientation Right   Pain Descriptors / Indicators Sore   Pain Type Chronic pain  Pain Radiating Towards starts in hip and goes down legs   Pain Onset More than a month ago   Pain Frequency Constant   Aggravating Factors  weight bearing   Pain Relieving Factors rest, ice   Effect of Pain on Daily Activities limited mobility                         [facility] Adult PT Treatment/Exercise - 04/25/16 0001      Bed Mobility   Bed Mobility Sit to Sidelying Left  log rolling 3sets     Ambulation/Gait   Gait Pattern Step-through pattern   Ambulation Surface Level   Gait Comments gait forwards and backears in parallel bars 4x70f, min guard       Lumbar Exercises: Standing   Other Standing Lumbar Exercises sidestepping in // bars   Other Standing Lumbar Exercises standing tall brining hips forward     Lumbar Exercises: Supine   Glut Set 10 reps   Bridge 10 reps   Bridge Limitations unable to raise bottom off of table   Other Supine Lumbar Exercises supine stretch   Other Supine Lumbar Exercises quad sets, glut sets, arms in ER above head to improve extension                  PT Short Term Goals - 04/03/16 1218      PT SHORT TERM GOAL #1   Title Pt will have a  5 cm reduction in bilateral  for improved fitting of clothing    Baseline DC-ed from lymph PT    Status Achieved     PT SHORT TERM GOAL #2   Title Pt will be able to identify the signs and symptoms of cellulitis due to prolong lymphstasis   Baseline DC-ed from lymph PT    Time 1   Period Weeks   Status Achieved     PT SHORT TERM GOAL #3   Title Pt will have a 5 cm decrease at dorsum of foot to improve ability to don shoes    Baseline DC-ed from lymph PT    Time 3   Period Weeks   Status On-going     PT SHORT TERM GOAL #4   Title Pt will be I in remedial exercises to promote lymph circulaiton    Baseline DC-ed from lymph PT    Time 2   Period Weeks   Status Achieved     PT SHORT TERM GOAL #5   Title Pt to be able to complete bed mobility independently    Time 2   Period Weeks   Status On-going     PT SHORT TERM GOAL #6   Title Patient to be able to stand for five minutes for improved transfers and decreased risk of falling    Time 3   Period Weeks   Status On-going           PT Long Term Goals - 04/03/16 1219      PT LONG TERM GOAL #1   Title Pt will have a 7cm  reduction in fluid in bilateral LE  to promote independence of transfers. Pt to be able to transfer to bed and be able to lift his feet onto the bed on his own.    Baseline DC-ed from lymph PT    Time 6   Period Weeks   Status Achieved     PT LONG TERM GOAL #2   Title  Pt to have acquired compression garment for bilateral  LE to allow maintance phase of treatment to begin    Time 6   Period Weeks   Status Achieved     PT LONG TERM GOAL #3   Title Pt to have no drainage from either LE to reduce risk of infection .   Time 3   Period Weeks   Status Achieved     PT LONG TERM GOAL #4   Title Pt life impact to decrease by at least 15 points to show improved physical/functional concerns    Time 6   Period Weeks   Status On-going     PT LONG TERM GOAL #5   Title Pt to be able to ambulate with walker for 25 feet to allow patient to walk in his apartment to allow patient to go into the restroom when his aide is not present.    Time 6   Period Weeks   Status On-going               Plan - 04/25/16 1041    Clinical Impression Statement Session focus on improving gait mechanics, strengthening and overall mobility.  Pt with forward trunk flexed posture and unable to stand fully erect due to hip contracture.  Added standing hip flexor stretches to improve posture.  Pt stated he sits in wheelchair all day and sleeps in recliner, pt educated on importance of improving extension to progress to functional strengthening and gait.  Pt instructed and able to demonstrate bed mobility independently following cueing for sitting on edge of bed to supine with extension based exercises.  Pt advised to begin laying in bed daily (with aids presenece for safety) to reduce hip flexion contracture.  End of session pt limited by fatigue, reports pain reduced Bil knees.     Rehab Potential Good   PT Frequency 2x / week   PT Duration 4 weeks   PT Treatment/Interventions ADLs/Self Care Home Management;Gait training;Functional mobility training;Therapeutic activities;Therapeutic exercise;Balance training;Neuromuscular re-education;Patient/family education;Manual techniques;Energy conservation   PT Next Visit Plan begin manual stretching of hip flexors on mat; strength, gait,  balance       Patient will benefit from skilled therapeutic intervention in order to improve the following deficits and impairments:  Abnormal gait, Decreased balance, Decreased activity tolerance, Decreased range of motion, Decreased strength, Increased edema, Pain  Visit Diagnosis: Difficulty in walking, not elsewhere classified  Unsteadiness on feet  Muscle weakness (generalized)  Arthralgia of both lower legs     Problem List Patient Active Problem List   Diagnosis Date Noted  . Diastolic dysfunction 23/55/7322  . Stage II pressure ulcer of sacral region 02/13/2015  . Atypical nevi 01/18/2015  . Intertriginous candidiasis 12/23/2014  . Hypothyroidism 11/30/2014  . Bradycardia 07/21/2014  . Sinus pause 07/21/2014  . Liver fibrosis ([place]) 06/29/2014  . IDA (iron deficiency anemia) 06/29/2014  . Iron deficiency anemia 05/31/2014  . Hepatomegaly 05/25/2014  . Difficulty walking 05/03/2014  . Knee pain, chronic 05/03/2014  . Stiffness of joint, not elsewhere classified, pelvic region and thigh 05/03/2014  . Dermatitis 04/12/2014  . HNP (herniated nucleus pulposus), lumbar 08/30/2013  . Spinal stenosis of lumbar region 07/06/2013  . Carpal tunnel syndrome 04/13/2013  . Lymphedema 04/13/2013  . Status post arthroscopy of left knee 04/13/2013  . DDD (degenerative disc disease), lumbosacral 04/13/2013  . Peripheral neuropathy ([place]) 01/05/2013  . S/P arthroscopy of left knee 11/09/2012  . Osteoarthritis of left knee 10/27/2012  . Meniscus tear  10/07/2012  . Patellar tendonitis 07/01/2012  . NASH (nonalcoholic steatohepatitis) 05/22/2007  . Type II diabetes mellitus with neurological manifestations ([place]) 05/20/2007  . Hyperlipidemia 10/15/2006  . OBESITY, MORBID 10/15/2006  . Essential hypertension 10/15/2006  . GERD 10/15/2006   Ihor Austin, [place]; [place]  Aldona Lento 04/25/2016, 12:55 PM  [facility] [address] [place], [place], [address] Phone: [phone]   Fax:  [phone]  Name: NUMAIR MASDEN MRN: [id number] Date of Birth: 03-10-1946

## 2016-04-25 NOTE — Telephone Encounter (Signed)
Received request from pharmacy for PA on Voltaren.   PA submitted.   Dx: OA knee.  Case [id number] approved.   Pharmacy made aware.

## 2016-04-26 ENCOUNTER — Ambulatory Visit (HOSPITAL_COMMUNITY): Payer: Medicare Other

## 2016-04-26 ENCOUNTER — Telehealth: Payer: Self-pay | Admitting: Family Medicine

## 2016-04-26 DIAGNOSIS — M6281 Muscle weakness (generalized): Secondary | ICD-10-CM

## 2016-04-26 DIAGNOSIS — R262 Difficulty in walking, not elsewhere classified: Secondary | ICD-10-CM

## 2016-04-26 DIAGNOSIS — M25562 Pain in left knee: Secondary | ICD-10-CM | POA: Diagnosis not present

## 2016-04-26 DIAGNOSIS — R2681 Unsteadiness on feet: Secondary | ICD-10-CM | POA: Diagnosis not present

## 2016-04-26 DIAGNOSIS — M25561 Pain in right knee: Secondary | ICD-10-CM

## 2016-04-26 NOTE — Telephone Encounter (Signed)
Izora Gala from [facility] called asking for a return call immediately she left this msg yesterday at 4:10.  CB#[phone]

## 2016-04-26 NOTE — Telephone Encounter (Signed)
Call placed to [facility] with [facility].   States that she requires copy of referral that was placed for patient approximately in June 2017 for gait training.   No referral noted.   Please advise.

## 2016-04-26 NOTE — Therapy (Signed)
[place], [place], [address] Phone: [phone]   Fax:  [phone]  Physical Therapy Treatment  Patient Details  Name: Michael [name] MRN: [id number] Date of Birth: November 28, 1945 Referring Provider: Dr. Vic Blackbird   Encounter Date: 04/26/2016      PT End of Session - 04/26/16 1132    Visit Number 17   Number of Visits 22   Date for PT Re-Evaluation 05/01/16   Authorization Type UHC medicare (gcode on visit 14)   Authorization - Visit Number 17   Authorization - Number of Visits 24   PT Start Time 9518   PT Stop Time 1118   PT Time Calculation (min) 43 min   Equipment Utilized During Treatment Gait belt   Activity Tolerance Patient tolerated treatment well;Patient limited by fatigue   Behavior During Therapy WFL for tasks assessed/performed      Past Medical History:  Diagnosis Date  . Arthritis   . COPD (chronic obstructive pulmonary disease) ([place])   . DDD (degenerative disc disease), lumbosacral   . Depression   . Diabetic neuropathy ([place])   . Diastolic dysfunction   . Essential hypertension   . GERD (gastroesophageal reflux disease)   . Hyperlipidemia   . Iron deficiency anemia   . Left knee DJD   . Lumbar spinal stenosis   . NASH (nonalcoholic steatohepatitis)   . Peripheral edema   . Peripheral edema 10/05/2015  . Type 2 diabetes mellitus ([place])     Past Surgical History:  Procedure Laterality Date  . BIOPSY N/A 07/21/2014   Procedure: BIOPSY;  Surgeon: Daneil Dolin, MD;  Location: [facility];  Service: Endoscopy;  Laterality: N/A;  . COLONOSCOPY  2011   [place]: normal colon, normal distal ileum  . COLONOSCOPY WITH PROPOFOL N/A 07/21/2014   Procedure: ATTEMPTED COLONOSCOPY WITH PROPOFOL-HAD TO STOP DUE TO BRADYCARDIA;  Surgeon: Daneil Dolin, MD;  Location: [facility];  Service: Endoscopy;  Laterality: N/A;  . EGD with enteroscopy  2011   [facility]: normal esophagus and stomach. Normal  duodenum, jejunum. No evidence of AVMs.   . ESOPHAGOGASTRODUODENOSCOPY (EGD) WITH PROPOFOL N/A 07/21/2014   Procedure: ESOPHAGOGASTRODUODENOSCOPY (EGD) WITH PROPOFOL;  Surgeon: Daneil Dolin, MD;  Location: [facility];  Service: Endoscopy;  Laterality: N/A;  . HERNIA REPAIR    . KNEE ARTHROSCOPY WITH MEDIAL MENISECTOMY Left 11/06/2012   Procedure: KNEE ARTHROSCOPY WITH MEDIAL MENISECTOMY;  Surgeon: Carole Civil, MD;  Location: [facility];  Service: Orthopedics;  Laterality: Left;  . Lipoma removal     Stomach  . LUMBAR LAMINECTOMY/DECOMPRESSION MICRODISCECTOMY Left 08/30/2013   Procedure: LUMBAR LAMINECTOMY/DECOMPRESSION MICRODISCECTOMY LEFT  LUMBAR TWO THREE;  Surgeon: Otilio Connors, MD;  Location: [facility];  Service: Neurosurgery;  Laterality: Left;  . SHOULDER SURGERY     Rght-rotator cuff    There were no vitals filed for this visit.      Subjective Assessment - 04/26/16 1116    Subjective Pt stated Bil knee pain scale 6/10.  Pt brought his personal walker into dept today.     Pertinent History CHF, DM, HTN, S/P lumbar surgeryL2-3, lymphedema, COPD,    Patient Stated Goals improve mobility, get back to walking    Currently in Pain? Yes   Pain Score 6    Pain Location Knee   Pain Orientation Right;Left   Pain Descriptors / Indicators Sore   Pain Type Chronic pain  [facility] Adult PT Treatment/Exercise - 04/26/16 0001      Bed Mobility   Bed Mobility Right Sidelying to Sit;Left Sidelying to Sit;Rolling Right;Rolling Left;Sit to Sidelying Right;Sit to Sidelying Left   Rolling Right 5: Supervision  cueing for hand placement to assist with trunk rotation   Rolling Right Details (indicate cue type and reason) cueing for hand placement to assist with trunk rotation   Rolling Left 5: Supervision   Rolling Left Details (indicate cue type and reason) cueing for hand placement to assist with trunk rotation   Right Sidelying to Sit 5: Supervision   Right Sidelying to  Sit Details (indicate cue type and reason) log rolling   Left Sidelying to Sit 5: Supervision   Left Sidelying to Sit Details (indicate cue type and reason) log rolling cueing for hand placement to assist with trunk rotation   Sit to Sidelying Right 5: Supervision   Sit to Sidelying Right Details (indicate cue type and reason) log rolling   Sit to Sidelying Left 5: Supervision   Sit to Sidelying Left Details (indicate cue type and reason) log rolling     Transfers   Comments stand-pivot transfer Min guard      Ambulation/Gait   Ambulation/Gait Yes   Ambulation/Gait Assistance 4: Min guard   Ambulation Distance (Feet) 38 Feet  8 ft in // bars, 31f with RW on ground   Assistive device Rolling walker   Gait Pattern Step-through pattern   Ambulation Surface Level   Gait velocity slow and steady     Lumbar Exercises: Supine   Glut Set 20 reps  2 sets x 10   Bridge 10 reps   Bridge Limitations unable to raise bottom off of table   Other Supine Lumbar Exercises supine stretch   Other Supine Lumbar Exercises quad sets, glut sets, arms in ER above head to improve extension     Knee/Hip Exercises: Supine   Short Arc Quad Sets 15 reps   Other Supine Knee/Hip Exercises glut sets 2x 10                  PT Short Term Goals - 04/03/16 1218      PT SHORT TERM GOAL #1   Title Pt will have a  5 cm reduction in bilateral  for improved fitting of clothing    Baseline DC-ed from lymph PT    Status Achieved     PT SHORT TERM GOAL #2   Title Pt will be able to identify the signs and symptoms of cellulitis due to prolong lymphstasis   Baseline DC-ed from lymph PT    Time 1   Period Weeks   Status Achieved     PT SHORT TERM GOAL #3   Title Pt will have a 5 cm decrease at dorsum of foot to improve ability to don shoes    Baseline DC-ed from lymph PT    Time 3   Period Weeks   Status On-going     PT SHORT TERM GOAL #4   Title Pt will be I in remedial exercises to promote  lymph circulaiton    Baseline DC-ed from lymph PT    Time 2   Period Weeks   Status Achieved     PT SHORT TERM GOAL #5   Title Pt to be able to complete bed mobility independently    Time 2   Period Weeks   Status On-going     PT SHORT TERM GOAL #6  Title Patient to be able to stand for five minutes for improved transfers and decreased risk of falling    Time 3   Period Weeks   Status On-going           PT Long Term Goals - 04/03/16 1219      PT LONG TERM GOAL #1   Title Pt will have a 7cm  reduction in fluid in bilateral LE  to promote independence of transfers. Pt to be able to transfer to bed and be able to lift his feet onto the bed on his own.    Baseline DC-ed from lymph PT    Time 6   Period Weeks   Status Achieved     PT LONG TERM GOAL #2   Title Pt to have acquired compression garment for bilateral  LE to allow maintance phase of treatment to begin    Time 6   Period Weeks   Status Achieved     PT LONG TERM GOAL #3   Title Pt to have no drainage from either LE to reduce risk of infection .   Time 3   Period Weeks   Status Achieved     PT LONG TERM GOAL #4   Title Pt life impact to decrease by at least 15 points to show improved physical/functional concerns    Time 6   Period Weeks   Status On-going     PT LONG TERM GOAL #5   Title Pt to be able to ambulate with walker for 25 feet to allow patient to walk in his apartment to allow patient to go into the restroom when his aide is not present.    Time 6   Period Weeks   Status On-going               Plan - 04/26/16 1135    Clinical Impression Statement Session focus on improving therapist activities to increase independence with gait training and strengthening to improve mobility.  Pt brought his own RW to session today.  Increased distance with gait training with min guard and verbal/tactile cueing to improve posture.  Pt continues to present with extreme forward trunk flexed posture.  Aide  present for session.  Continued to encourage pt to get into bed to improve extension and reduce hip flexor contractures.  Pt able to complete bed mobility sit to supine independently with supervision for safety and min cueing for hand placement to assist with trunk rotation.  Pt and aide stated they will begin gettinginto bed as soon as able.     Rehab Potential Good   PT Frequency 2x / week   PT Duration 4 weeks   PT Treatment/Interventions ADLs/Self Care Home Management;Gait training;Functional mobility training;Therapeutic activities;Therapeutic exercise;Balance training;Neuromuscular re-education;Patient/family education;Manual techniques;Energy conservation   PT Next Visit Plan begin manual stretching of hip flexors on mat; strength, gait, balance       Patient will benefit from skilled therapeutic intervention in order to improve the following deficits and impairments:  Abnormal gait, Decreased balance, Decreased activity tolerance, Decreased range of motion, Decreased strength, Increased edema, Pain  Visit Diagnosis: Difficulty in walking, not elsewhere classified  Unsteadiness on feet  Muscle weakness (generalized)  Arthralgia of both lower legs     Problem List Patient Active Problem List   Diagnosis Date Noted  . Diastolic dysfunction 70/17/7939  . Stage II pressure ulcer of sacral region 02/13/2015  . Atypical nevi 01/18/2015  . Intertriginous candidiasis 12/23/2014  . Hypothyroidism 11/30/2014  .  Bradycardia 07/21/2014  . Sinus pause 07/21/2014  . Liver fibrosis ([place]) 06/29/2014  . IDA (iron deficiency anemia) 06/29/2014  . Iron deficiency anemia 05/31/2014  . Hepatomegaly 05/25/2014  . Difficulty walking 05/03/2014  . Knee pain, chronic 05/03/2014  . Stiffness of joint, not elsewhere classified, pelvic region and thigh 05/03/2014  . Dermatitis 04/12/2014  . HNP (herniated nucleus pulposus), lumbar 08/30/2013  . Spinal stenosis of lumbar region 07/06/2013  .  Carpal tunnel syndrome 04/13/2013  . Lymphedema 04/13/2013  . Status post arthroscopy of left knee 04/13/2013  . DDD (degenerative disc disease), lumbosacral 04/13/2013  . Peripheral neuropathy ([place]) 01/05/2013  . S/P arthroscopy of left knee 11/09/2012  . Osteoarthritis of left knee 10/27/2012  . Meniscus tear 10/07/2012  . Patellar tendonitis 07/01/2012  . NASH (nonalcoholic steatohepatitis) 05/22/2007  . Type II diabetes mellitus with neurological manifestations ([place]) 05/20/2007  . Hyperlipidemia 10/15/2006  . OBESITY, MORBID 10/15/2006  . Essential hypertension 10/15/2006  . GERD 10/15/2006   Ihor Austin, [place]; [place]  Aldona Lento 04/26/2016, 11:50 AM  [place], [place], [address] Phone: [phone]   Fax:  [phone]  Name: LEORY ALLINSON MRN: [id number] Date of Birth: [birth year]

## 2016-04-30 ENCOUNTER — Ambulatory Visit (HOSPITAL_COMMUNITY): Payer: Medicare Other | Attending: Family Medicine | Admitting: Physical Therapy

## 2016-04-30 ENCOUNTER — Other Ambulatory Visit: Payer: Self-pay | Admitting: *Deleted

## 2016-04-30 DIAGNOSIS — M25562 Pain in left knee: Secondary | ICD-10-CM | POA: Insufficient documentation

## 2016-04-30 DIAGNOSIS — M25561 Pain in right knee: Secondary | ICD-10-CM | POA: Diagnosis not present

## 2016-04-30 DIAGNOSIS — R262 Difficulty in walking, not elsewhere classified: Secondary | ICD-10-CM | POA: Insufficient documentation

## 2016-04-30 DIAGNOSIS — M6281 Muscle weakness (generalized): Secondary | ICD-10-CM | POA: Insufficient documentation

## 2016-04-30 DIAGNOSIS — R2681 Unsteadiness on feet: Secondary | ICD-10-CM | POA: Diagnosis not present

## 2016-04-30 NOTE — Patient Outreach (Signed)
[facility] [place]) Care Management   04/30/2016  Michael [name] 1946-01-26 [id number]  Michael [name] is an 70 y.o. male with chronic diastolic heart failure with EF 60-65%, history of bradycardia, type II Diabetes, Hypertension, Iron Deficiency Anemia, NASH + Hepatomegaly, and degeneratove disc disease s/p surgery of L2-L3.   Michael [name] was admitted to the hospital in February with Acute/Chronic Diastolic Heart Failure and worsening lymphedema. Thereafter, he spent 21 days at [facility]. Michael [name] was referred to [facility] for assistance with management of CHF.   [facility] Care Management has been following Michael [name] in the community since his discharge from SNF. Focus of care management services has been around CHF and DM Disease management with particular attention to volume management and peripheral edema/lymphedema and lower extremity skin ailments.   Michael [name] has been attending outpatient PT at [facility] for treatment of lymphedema and he is very pleased with the improvement in his lower extremity swelling and skin condition. He is now transitioning to outpatient PT for strengthening,conditioning, and ambulation. He is very pleased about this transition.   Subjective: "I just wanted to make sure the skin on my backside looks okay. It feels a little irritated."  Objective:  BP 110/72   Pulse 74   Review of Systems  Constitutional: Negative.   HENT: Negative.   Eyes: Negative.   Respiratory: Negative.   Cardiovascular: Negative for chest pain, palpitations, orthopnea, claudication and leg swelling.  Gastrointestinal: Negative.   Genitourinary: Negative.   Musculoskeletal: Positive for myalgias. Negative for falls.  Skin:       Skin on sacrum slightly red above peri-anal area; small area on right scrotum where skin appears to be thin and irritated but is not open  Neurological: Negative.   Psychiatric/Behavioral: Negative.     Physical Exam    Constitutional: He appears well-developed and well-nourished.  Cardiovascular: Normal rate and regular rhythm.   Respiratory: Effort normal and breath sounds normal. No respiratory distress. He has no wheezes. He has no rhonchi. He has no rales.  GI: Soft. Bowel sounds are normal.  Neurological: He is alert.  Skin: Skin is warm and dry.     Psychiatric: He has a normal mood and affect. His speech is normal and behavior is normal. Judgment and thought content normal. Cognition and memory are normal.    Encounter Medications:   Outpatient Encounter Prescriptions as of 04/30/2016  Medication Sig  . ACCU-CHEK AVIVA PLUS test strip USE AS DIRECTED TWICE DAILY.  Marland Kitchen aspirin EC 81 MG tablet Take 81 mg by mouth daily.  . B-D ULTRAFINE III SHORT PEN 31G X 8 MM MISC USE AS DIRECTED DAILY WITH LANTUS.  Marland Kitchen benazepril (LOTENSIN) 40 MG tablet TAKE ONE TABLET BY MOUTH DAILY.  . bumetanide (BUMEX) 2 MG tablet Take 1.5 tablets (3 mg total) by mouth 2 (two) times daily.  Marland Kitchen BYSTOLIC 10 MG tablet TAKE ONE TABLET BY MOUTH DAILY.  . clobetasol cream (TEMOVATE) 0.05 % APPLY TO AFFECTED AREA 2 TIMES DAILY.  . diazepam (VALIUM) 5 MG tablet TAKE 1 TABLET BY MOUTH AT BEDTIME AS NEEDED FOR ANXIETY.  . diclofenac sodium (VOLTAREN) 1 % GEL Apply 4 g topically 4 (four) times daily.  . Ferrous Sulfate (IRON) 325 (65 FE) MG TABS Take 1 tablet by mouth daily.   Marland Kitchen gabapentin (NEURONTIN) 400 MG capsule TAKE ONE CAPSULE BY MOUTH THREE TIMES DAILY.  Marland Kitchen LANTUS SOLOSTAR 100 UNIT/ML Solostar Pen INJECT 30 UNITS SUBCUTANEOUSLY AT BEDTIME.  Marland Kitchen  levothyroxine (SYNTHROID, LEVOTHROID) 88 MCG tablet TAKE 1 TABLET BY MOUTH DAILY BEFORE BREAKFAST.  . Magnesium 400 MG TABS Take 400 mg by mouth 2 (two) times daily.  . meloxicam (MOBIC) 15 MG tablet TAKE ONE TABLET BY MOUTH DAILY WITH FOOD.  Marland Kitchen metFORMIN (GLUCOPHAGE) 1000 MG tablet TAKE ONE TABLET BY MOUTH TWICE DAILY WITH A MEAL.  Marland Kitchen metolazone (ZAROXOLYN) 2.5 MG tablet Take 1 tablet (2.5 mg  total) by mouth daily.  Marland Kitchen nystatin cream (MYCOSTATIN) Apply 1 application topically 2 (two) times daily.  Marland Kitchen omeprazole (PRILOSEC) 20 MG capsule TAKE ONE CAPSULE BY MOUTH TWICE DAILY.  Marland Kitchen oxyCODONE-acetaminophen (PERCOCET) 7.5-325 MG tablet Take one tablet by mouth every 6 hours as needed for severe pain. Max APAP 3gm/24hrs from all sources  . potassium chloride (K-DUR) 10 MEQ tablet Take 10 mEq by mouth 2 (two) times daily.  . pravastatin (PRAVACHOL) 40 MG tablet TAKE ONE TABLET BY MOUTH DAILY.   Fall/Depression Screening:    PHQ 2/9 Scores 04/04/2016 12/28/2015 12/04/2015 03/01/2015 01/18/2015 02/08/2014 01/05/2013  PHQ - 2 Score 0 0 2 0 2 2 0  PHQ- 9 Score - - 4 - 8 8 -    Assessment:  70 year old gentleman living in [place] with CHF, DM, lower extremity lymphedema.  Michael [name] asked if I would come by to see him today and evaluate the skin on his sacrum.   Acute Health Condition (interruption in skin integrity inferior gluteal folds) - Michael [name] has history of ulcers of the peri-anal skin, presumably from sitting for extended periods of time because of his severe lymphedema and lower extremity swelling. He had a home health nurse coming weekly to check his skin and change dressings.   Today, I examined the skin of the sacrum, peri-anal area, and scrotum and while some redness was noted, no skin breakdown was evident. As per Michael [name] request, I applied a "second skin" dressing to protect the sacral skin and relieve discomfort.    Plan:   Michael [name] will continue twice weekly visits to Outpatient PT for strengthening, conditioning, ambulation, balance, and gait training.   Michael [name] will attend all scheduled provider appointments.   Michael [name] will take all medications as prescribed and will check cbg's daily, recording and calling for findings outside established parameters.   Michael [name] will report new or worsening skin symptoms.  Michael [name] Bussing weigh himself daily and  record, calling for weight gain of 3# overnight or 5# in a week, increased fatigue/swelling, or shortness of breath/chest pain, or any other new or worsened symptom.   I will see Michael [name] at home next month for a routine visit and follow up of DM and lymphedema management.    [facility]  [phone]

## 2016-04-30 NOTE — Therapy (Signed)
[facility] [address] [place], [place], [address] Phone: [phone]   Fax:  [phone]  Physical Therapy Treatment (Re-Assessment)  Patient Details  Name: Michael [name] MRN: [id number] Date of Birth: 1946-04-23 Referring Provider: Dr. Vic Blackbird   Encounter Date: 04/30/2016      PT End of Session - 04/30/16 1042    Visit Number 18   Number of Visits 22   Date for PT Re-Evaluation 05/28/16   Authorization Type [employer] medicare (gcode on visit 60)   Authorization - Visit Number 18   Authorization - Number of Visits 28   PT Start Time [phone]   PT Stop Time 1030   PT Time Calculation (min) 43 min   Equipment Utilized During Treatment Gait belt   Activity Tolerance Patient tolerated treatment well;Patient limited by fatigue   Behavior During Therapy WFL for tasks assessed/performed      Past Medical History:  Diagnosis Date  . Arthritis   . COPD (chronic obstructive pulmonary disease) ([place])   . DDD (degenerative disc disease), lumbosacral   . Depression   . Diabetic neuropathy ([place])   . Diastolic dysfunction   . Essential hypertension   . GERD (gastroesophageal reflux disease)   . Hyperlipidemia   . Iron deficiency anemia   . Left knee DJD   . Lumbar spinal stenosis   . NASH (nonalcoholic steatohepatitis)   . Peripheral edema   . Peripheral edema 10/05/2015  . Type 2 diabetes mellitus ([place])     Past Surgical History:  Procedure Laterality Date  . BIOPSY N/A 07/21/2014   Procedure: BIOPSY;  Surgeon: Daneil Dolin, MD;  Location: [facility];  Service: Endoscopy;  Laterality: N/A;  . COLONOSCOPY  2011   [facility]: normal colon, normal distal ileum  . COLONOSCOPY WITH PROPOFOL N/A 07/21/2014   Procedure: ATTEMPTED COLONOSCOPY WITH PROPOFOL-HAD TO STOP DUE TO BRADYCARDIA;  Surgeon: Daneil Dolin, MD;  Location: [facility];  Service: Endoscopy;  Laterality: N/A;  . EGD with enteroscopy  2011   [facility]: normal esophagus and  stomach. Normal duodenum, jejunum. No evidence of AVMs.   . ESOPHAGOGASTRODUODENOSCOPY (EGD) WITH PROPOFOL N/A 07/21/2014   Procedure: ESOPHAGOGASTRODUODENOSCOPY (EGD) WITH PROPOFOL;  Surgeon: Daneil Dolin, MD;  Location: [facility];  Service: Endoscopy;  Laterality: N/A;  . HERNIA REPAIR    . KNEE ARTHROSCOPY WITH MEDIAL MENISECTOMY Left 11/06/2012   Procedure: KNEE ARTHROSCOPY WITH MEDIAL MENISECTOMY;  Surgeon: Carole Civil, MD;  Location: [facility];  Service: Orthopedics;  Laterality: Left;  . Lipoma removal     Stomach  . LUMBAR LAMINECTOMY/DECOMPRESSION MICRODISCECTOMY Left 08/30/2013   Procedure: LUMBAR LAMINECTOMY/DECOMPRESSION MICRODISCECTOMY LEFT  LUMBAR TWO THREE;  Surgeon: Otilio Connors, MD;  Location: [place] [place] [facility];  Service: Neurosurgery;  Laterality: Left;  . SHOULDER SURGERY     Rght-rotator cuff    There were no vitals filed for this visit.      Subjective Assessment - 04/30/16 0952    Subjective Patient arrives stating that he is getting better, he is able to get up and he is trying to get out of recliner more. He and his aide have been working on his bed mobilty at home as well so that he can be more independent with this. He is still having pain in his legs and it is hard to get out of recliner; he is not walking a whole lot at home right now.    Pertinent History CHF, DM, HTN,  S/P lumbar surgeryL2-3, lymphedema, COPD,    How long can you stand comfortably? 8/1- still around 2 minutes    How long can you walk comfortably? 8/1- not walking a lot at home due to not having someone on hand in case of fall    Patient Stated Goals improve mobility, get back to walking    Currently in Pain? Yes   Pain Score 6    Pain Location Leg   Pain Orientation Right;Left   Pain Descriptors / Indicators Sore   Pain Type Chronic pain   Pain Radiating Towards starts in hip and goes down legs    Pain Onset More than a month ago   Pain Frequency Constant   Aggravating Factors  sometimes  weight bearing   Pain Relieving Factors rest, goes away on its own    Effect of Pain on Daily Activities limited mobility             [facility] PT Assessment - 04/30/16 1002      Strength   Right Hip Flexion 2/5   Right Hip ABduction 2-/5   Left Hip Flexion 3-/5   Left Hip ABduction 2+/5   Right Knee Extension 4/5   Left Knee Extension 4/5   Right Ankle Dorsiflexion 4+/5   Left Ankle Dorsiflexion 4+/5     Bed Mobility   Rolling Right 5: Supervision   Rolling Left 5: Supervision   Supine to Sit 4: Min guard   Sit to Supine 4: Min assist     Transfers   Transfers Stand Pivot Transfers   Comments mod(I)                      [facility] Adult PT Treatment/Exercise - 04/30/16 1002      Ambulation/Gait   Ambulation/Gait Yes   Ambulation/Gait Assistance 4: Min guard   Ambulation Distance (Feet) --  28, 34f    Assistive device Rolling walker   Gait Pattern Step-through pattern;Right flexed knee in stance;Left flexed knee in stance;Decreased trunk rotation;Trunk flexed   Ambulation Surface Level;Indoor   Gait Comments use of personal walker, limited by fatigue                 PT Education - 04/30/16 1042    Education provided Yes   Education Details progress with skilled PT services, POC moving forward, will update goals as patient appears to be progressing well    Person(s) Educated Patient   Methods Explanation   Comprehension Verbalized understanding          PT Short Term Goals - 04/30/16 1021      PT SHORT TERM GOAL #1   Title Pt will have a  5 cm reduction in bilateral  for improved fitting of clothing    Baseline DC-ed from lymph PT    Time 3   Period Weeks   Status Achieved     PT SHORT TERM GOAL #2   Title Pt will be able to identify the signs and symptoms of cellulitis due to prolong lymphstasis   Baseline DC-ed from lymph PT    Time 1   Period Weeks   Status Achieved     PT SHORT TERM GOAL #3   Title Pt will have a 5 cm decrease  at dorsum of foot to improve ability to don shoes    Baseline DC-ed from lymph PT    Time 3   Period Weeks   Status Deferred  PT SHORT TERM GOAL #4   Title Pt will be I in remedial exercises to promote lymph circulaiton    Baseline DC-ed from lymph PT    Time 2   Period Weeks   Status Achieved     PT SHORT TERM GOAL #5   Title Pt to be able to complete bed mobility independently    Baseline 8/1- improving but still needs work    Time 2   Period Weeks   Status On-going     PT SHORT TERM GOAL #6   Title Patient to be able to stand for five minutes for improved transfers and decreased risk of falling    Baseline 8/1- improving    Time 3   Period Weeks   Status On-going     PT SHORT TERM GOAL #7   Title Patient to be able to verbally state 5/5 fall precautions in order to show good understanding of safety and fall prevention    Time 3   Period Weeks   Status New           PT Long Term Goals - 04/30/16 1021      PT LONG TERM GOAL #1   Title Pt will have a 7cm  reduction in fluid in bilateral LE  to promote independence of transfers. Pt to be able to transfer to bed and be able to lift his feet onto the bed on his own.    Baseline DC-ed from lymph PT    Time 6   Period Weeks   Status Achieved     PT LONG TERM GOAL #2   Title Pt to have acquired compression garment for bilateral  LE to allow maintance phase of treatment to begin    Baseline DC-ed from lymph PT    Time 6   Period Weeks   Status Achieved     PT LONG TERM GOAL #3   Title Pt to have no drainage from either LE to reduce risk of infection .   Baseline DC-ed from lymph PT    Time 3   Period Weeks   Status Achieved     PT LONG TERM GOAL #4   Title Pt life impact to decrease by at least 15 points to show improved physical/functional concerns    Time 6   Period Weeks   Status On-going     PT LONG TERM GOAL #5   Title Pt to be able to ambulate with walker for 25 feet to allow patient to walk in  his apartment to allow patient to go into the restroom when his aide is not present.    Baseline 8/1- 81f today    Time 6   Period Weeks   Status Achieved     Additional Long Term Goals   Additional Long Term Goals Yes     PT LONG TERM GOAL #6   Title Patient to be able to ambulate 539fwith personal rolling walker and minimal fatigue to further improve mobility and safety at home    Time 6   Period Weeks   Status New     PT LONG TERM GOAL #7   Title Patient to be able to stand statically for at least 6 minutes with U UE support and minimal fatigue in order to improve mobilty and self-care at home    Time 6   Period Weeks   Status New  Plan - 2016-05-16 1043    Clinical Impression Statement Re-assessment performed today. Patient appears to be making slow but steady progress towards his goals, as indicated by improvements in functional mobility, functional transfers, and increased tolerance to gait with rolling walker. Patient does continue to be limited by fatigue as well as pain in his legs, however states he and his aide have been working on mobility at home and he is trying to be in his recliner less as well. Patient remains motivated to continue, and at this time DPT recommends extension of skilled PT services to continue addressing remaining limitations and assist in reaching goals yet unmet as well as to reduce overall fall risk.    Rehab Potential Good   PT Frequency 2x / week   PT Duration 4 weeks   PT Treatment/Interventions ADLs/Self Care Home Management;Gait training;Functional mobility training;Therapeutic activities;Therapeutic exercise;Balance training;Neuromuscular re-education;Patient/family education;Manual techniques;Energy conservation   PT Next Visit Plan begin manual stretching of hip flexors on mat; strength, gait, balance. Discuss life-alert system to address fear of falling at home.    Consulted and Agree with Plan of Care Patient       Patient will benefit from skilled therapeutic intervention in order to improve the following deficits and impairments:  Abnormal gait, Decreased balance, Decreased activity tolerance, Decreased range of motion, Decreased strength, Increased edema, Pain  Visit Diagnosis: Difficulty in walking, not elsewhere classified - Plan: PT plan of care cert/re-cert  Unsteadiness on feet - Plan: PT plan of care cert/re-cert  Muscle weakness (generalized) - Plan: PT plan of care cert/re-cert       G-Codes - 05/16/16 1049    Functional Assessment Tool Used skilled clinical assessment of gait, functional mobility, strength, gait    Functional Limitation Mobility: Walking and moving around   Mobility: Walking and Moving Around Current Status (W4665) At least 60 percent but less than 80 percent impaired, limited or restricted   Mobility: Walking and Moving Around Goal Status (L9357) At least 40 percent but less than 60 percent impaired, limited or restricted      Problem List Patient Active Problem List   Diagnosis Date Noted  . Diastolic dysfunction 01/77/9390  . Stage II pressure ulcer of sacral region 02/13/2015  . Atypical nevi 01/18/2015  . Intertriginous candidiasis 12/23/2014  . Hypothyroidism 11/30/2014  . Bradycardia 07/21/2014  . Sinus pause 07/21/2014  . Liver fibrosis ([place]) 06/29/2014  . IDA (iron deficiency anemia) 06/29/2014  . Iron deficiency anemia 05/31/2014  . Hepatomegaly 05/25/2014  . Difficulty walking 05/03/2014  . Knee pain, chronic 05/03/2014  . Stiffness of joint, not elsewhere classified, pelvic region and thigh 05/03/2014  . Dermatitis 04/12/2014  . HNP (herniated nucleus pulposus), lumbar 08/30/2013  . Spinal stenosis of lumbar region 07/06/2013  . Carpal tunnel syndrome 04/13/2013  . Lymphedema 04/13/2013  . Status post arthroscopy of left knee 04/13/2013  . DDD (degenerative disc disease), lumbosacral 04/13/2013  . Peripheral neuropathy ([place]) 01/05/2013   . S/P arthroscopy of left knee 11/09/2012  . Osteoarthritis of left knee 10/27/2012  . Meniscus tear 10/07/2012  . Patellar tendonitis 07/01/2012  . NASH (nonalcoholic steatohepatitis) 05/22/2007  . Type II diabetes mellitus with neurological manifestations ([place]) 05/20/2007  . Hyperlipidemia 10/15/2006  . OBESITY, MORBID 10/15/2006  . Essential hypertension 10/15/2006  . GERD 10/15/2006    Deniece Ree PT, DPT [facility] [address] [place], [place], [address] Phone: [phone]   Fax:  [phone]  Name: Michael  DOMANI [name] MRN: [id number] Date of Birth: 03-10-1946

## 2016-04-30 NOTE — Telephone Encounter (Signed)
They had lost the signed order they had for his gait training.  I told her where to find in epic under media and she was able to reprint signed order for their records

## 2016-05-02 ENCOUNTER — Encounter: Payer: Self-pay | Admitting: *Deleted

## 2016-05-02 ENCOUNTER — Ambulatory Visit (HOSPITAL_COMMUNITY): Payer: Medicare Other | Admitting: Physical Therapy

## 2016-05-02 DIAGNOSIS — M25561 Pain in right knee: Secondary | ICD-10-CM | POA: Diagnosis not present

## 2016-05-02 DIAGNOSIS — M6281 Muscle weakness (generalized): Secondary | ICD-10-CM

## 2016-05-02 DIAGNOSIS — R2681 Unsteadiness on feet: Secondary | ICD-10-CM | POA: Diagnosis not present

## 2016-05-02 DIAGNOSIS — R262 Difficulty in walking, not elsewhere classified: Secondary | ICD-10-CM | POA: Diagnosis not present

## 2016-05-02 DIAGNOSIS — M25562 Pain in left knee: Secondary | ICD-10-CM | POA: Diagnosis not present

## 2016-05-02 NOTE — Therapy (Signed)
[facility] [address] [place], [place], [address] Phone: [phone]   Fax:  [phone]  Physical Therapy Treatment  Patient Details  Name: Michael [name] MRN: [id number] Date of Birth: 12-09-1945 Referring Provider: Dr. Vic Blackbird   Encounter Date: 05/02/2016      PT End of Session - 05/02/16 1034    Visit Number 19   Number of Visits 26   Date for PT Re-Evaluation 05/28/16   Authorization Type UHC medicare (gcode on visit 18)   Authorization - Visit Number 19   Authorization - Number of Visits 28   PT Start Time [phone]   PT Stop Time 1030   PT Time Calculation (min) 38 min   Equipment Utilized During Treatment Gait belt   Activity Tolerance Patient tolerated treatment well   Behavior During Therapy WFL for tasks assessed/performed      Past Medical History:  Diagnosis Date  . Arthritis   . COPD (chronic obstructive pulmonary disease) ([place])   . DDD (degenerative disc disease), lumbosacral   . Depression   . Diabetic neuropathy ([place])   . Diastolic dysfunction   . Essential hypertension   . GERD (gastroesophageal reflux disease)   . Hyperlipidemia   . Iron deficiency anemia   . Left knee DJD   . Lumbar spinal stenosis   . NASH (nonalcoholic steatohepatitis)   . Peripheral edema   . Peripheral edema 10/05/2015  . Type 2 diabetes mellitus ([place])     Past Surgical History:  Procedure Laterality Date  . BIOPSY N/A 07/21/2014   Procedure: BIOPSY;  Surgeon: Daneil Dolin, MD;  Location: [facility];  Service: Endoscopy;  Laterality: N/A;  . COLONOSCOPY  2011   [facility] [place]: normal colon, normal distal ileum  . COLONOSCOPY WITH PROPOFOL N/A 07/21/2014   Procedure: ATTEMPTED COLONOSCOPY WITH PROPOFOL-HAD TO STOP DUE TO BRADYCARDIA;  Surgeon: Daneil Dolin, MD;  Location: [facility];  Service: Endoscopy;  Laterality: N/A;  . EGD with enteroscopy  2011   [facility]: normal esophagus and stomach. Normal duodenum, jejunum. No evidence  of AVMs.   . ESOPHAGOGASTRODUODENOSCOPY (EGD) WITH PROPOFOL N/A 07/21/2014   Procedure: ESOPHAGOGASTRODUODENOSCOPY (EGD) WITH PROPOFOL;  Surgeon: Daneil Dolin, MD;  Location: [facility];  Service: Endoscopy;  Laterality: N/A;  . HERNIA REPAIR    . KNEE ARTHROSCOPY WITH MEDIAL MENISECTOMY Left 11/06/2012   Procedure: KNEE ARTHROSCOPY WITH MEDIAL MENISECTOMY;  Surgeon: Carole Civil, MD;  Location: [facility];  Service: Orthopedics;  Laterality: Left;  . Lipoma removal     Stomach  . LUMBAR LAMINECTOMY/DECOMPRESSION MICRODISCECTOMY Left 08/30/2013   Procedure: LUMBAR LAMINECTOMY/DECOMPRESSION MICRODISCECTOMY LEFT  LUMBAR TWO THREE;  Surgeon: Otilio Connors, MD;  Location: [facility];  Service: Neurosurgery;  Laterality: Left;  . SHOULDER SURGERY     Rght-rotator cuff    There were no vitals filed for this visit.      Subjective Assessment - 05/02/16 0959    Subjective Patient arrives stating he is feeling OK, but his legs are still bothering him; no major changes since last session    Pertinent History CHF, DM, HTN, S/P lumbar surgeryL2-3, lymphedema, COPD,    Currently in Pain? Yes   Pain Score 6    Pain Location Leg   Pain Orientation Right;Left                         [facility] Adult PT Treatment/Exercise - 05/02/16 0001  Ambulation/Gait   Ambulation/Gait Yes   Ambulation/Gait Assistance 4: Min guard   Ambulation Distance (Feet) --  99f, 357f 99f57f Assistive device Rolling walker   Gait Pattern Step-through pattern;Right flexed knee in stance;Left flexed knee in stance;Decreased trunk rotation;Trunk flexed   Ambulation Surface Level;Indoor   Gait Comments limited by fatigue/SOB today      Lumbar Exercises: Seated   Other Seated Lumbar Exercises lateral trunk flexion and cone rotations seated at EOB with feet supported      Lumbar Exercises: Supine   Ab Set 10 reps   AB Set Limitations 3 second holds    Glut Set 15 reps   Bridge 10 reps   Bridge  Limitations unable to clear hips from table    Other Supine Lumbar Exercises supine starfish stretch with towels under heels and weights on knees, x5 minutes    Other Supine Lumbar Exercises supine diver extension stretch 1x10, UEs overhead to promote extension                 PT Education - 05/02/16 1034    Education provided Yes   Education Details focus on getting quality sleep this weekend, stay hydrated    Person(s) Educated Patient   Methods Explanation   Comprehension Verbalized understanding          PT Short Term Goals - 04/30/16 1021      PT SHORT TERM GOAL #1   Title Pt will have a  5 cm reduction in bilateral  for improved fitting of clothing    Baseline DC-ed from lymph PT    Time 3   Period Weeks   Status Achieved     PT SHORT TERM GOAL #2   Title Pt will be able to identify the signs and symptoms of cellulitis due to prolong lymphstasis   Baseline DC-ed from lymph PT    Time 1   Period Weeks   Status Achieved     PT SHORT TERM GOAL #3   Title Pt will have a 5 cm decrease at dorsum of foot to improve ability to don shoes    Baseline DC-ed from lymph PT    Time 3   Period Weeks   Status Deferred     PT SHORT TERM GOAL #4   Title Pt will be I in remedial exercises to promote lymph circulaiton    Baseline DC-ed from lymph PT    Time 2   Period Weeks   Status Achieved     PT SHORT TERM GOAL #5   Title Pt to be able to complete bed mobility independently    Baseline 8/1- improving but still needs work    Time 2   Period Weeks   Status On-going     PT SHORT TERM GOAL #6   Title Patient to be able to stand for five minutes for improved transfers and decreased risk of falling    Baseline 8/1- improving    Time 3   Period Weeks   Status On-going     PT SHORT TERM GOAL #7   Title Patient to be able to verbally state 5/5 fall precautions in order to show good understanding of safety and fall prevention    Time 3   Period Weeks   Status New            PT Long Term Goals - 04/30/16 1021      PT LONG TERM GOAL #1   Title Pt will  have a 7cm  reduction in fluid in bilateral LE  to promote independence of transfers. Pt to be able to transfer to bed and be able to lift his feet onto the bed on his own.    Baseline DC-ed from lymph PT    Time 6   Period Weeks   Status Achieved     PT LONG TERM GOAL #2   Title Pt to have acquired compression garment for bilateral  LE to allow maintance phase of treatment to begin    Baseline DC-ed from lymph PT    Time 6   Period Weeks   Status Achieved     PT LONG TERM GOAL #3   Title Pt to have no drainage from either LE to reduce risk of infection .   Baseline DC-ed from lymph PT    Time 3   Period Weeks   Status Achieved     PT LONG TERM GOAL #4   Title Pt life impact to decrease by at least 15 points to show improved physical/functional concerns    Time 6   Period Weeks   Status On-going     PT LONG TERM GOAL #5   Title Pt to be able to ambulate with walker for 25 feet to allow patient to walk in his apartment to allow patient to go into the restroom when his aide is not present.    Baseline 8/1- 79f today    Time 6   Period Weeks   Status Achieved     Additional Long Term Goals   Additional Long Term Goals Yes     PT LONG TERM GOAL #6   Title Patient to be able to ambulate 529fwith personal rolling walker and minimal fatigue to further improve mobility and safety at home    Time 6   Period Weeks   Status New     PT LONG TERM GOAL #7   Title Patient to be able to stand statically for at least 6 minutes with U UE support and minimal fatigue in order to improve mobilty and self-care at home    Time 6   Period Weeks   Status New               Plan - 05/02/16 1035    Clinical Impression Statement Began session with supine starfish stretch to promote flexibility of anterior muscles including hip flexors for posture; performed supine exercises as well as  introducing seated cone and trunk exercises today with good tolerance by patient however he was fatigued by exercises today. Finished session with gait with personal walker today to patient tolerance, which appeared limited today due to general SOB and fatigue. Encouraged patient to get quality sleep, as he stated he had not slept well last night, over the weekend.    Rehab Potential Good   PT Frequency 2x / week   PT Duration 4 weeks   PT Treatment/Interventions ADLs/Self Care Home Management;Gait training;Functional mobility training;Therapeutic activities;Therapeutic exercise;Balance training;Neuromuscular re-education;Patient/family education;Manual techniques;Energy conservation   PT Next Visit Plan continue stretches on mat in supine; strength, gait, balance, core. Discuss life-alert system for home use.    Consulted and Agree with Plan of Care Patient      Patient will benefit from skilled therapeutic intervention in order to improve the following deficits and impairments:  Abnormal gait, Decreased balance, Decreased activity tolerance, Decreased range of motion, Decreased strength, Increased edema, Pain  Visit Diagnosis: Difficulty in walking, not elsewhere classified  Unsteadiness on feet  Muscle weakness (generalized)     Problem List Patient Active Problem List   Diagnosis Date Noted  . Diastolic dysfunction 17/71/1657  . Stage II pressure ulcer of sacral region 02/13/2015  . Atypical nevi 01/18/2015  . Intertriginous candidiasis 12/23/2014  . Hypothyroidism 11/30/2014  . Bradycardia 07/21/2014  . Sinus pause 07/21/2014  . Liver fibrosis ([place]) 06/29/2014  . IDA (iron deficiency anemia) 06/29/2014  . Iron deficiency anemia 05/31/2014  . Hepatomegaly 05/25/2014  . Difficulty walking 05/03/2014  . Knee pain, chronic 05/03/2014  . Stiffness of joint, not elsewhere classified, pelvic region and thigh 05/03/2014  . Dermatitis 04/12/2014  . HNP (herniated nucleus  pulposus), lumbar 08/30/2013  . Spinal stenosis of lumbar region 07/06/2013  . Carpal tunnel syndrome 04/13/2013  . Lymphedema 04/13/2013  . Status post arthroscopy of left knee 04/13/2013  . DDD (degenerative disc disease), lumbosacral 04/13/2013  . Peripheral neuropathy ([place]) 01/05/2013  . S/P arthroscopy of left knee 11/09/2012  . Osteoarthritis of left knee 10/27/2012  . Meniscus tear 10/07/2012  . Patellar tendonitis 07/01/2012  . NASH (nonalcoholic steatohepatitis) 05/22/2007  . Type II diabetes mellitus with neurological manifestations ([place]) 05/20/2007  . Hyperlipidemia 10/15/2006  . OBESITY, MORBID 10/15/2006  . Essential hypertension 10/15/2006  . GERD 10/15/2006    Deniece Ree PT, DPT [facility] [address] [place], [place], [address] Phone: [phone]   Fax:  [phone]  Name: Michael [name] MRN: [id number] Date of Birth: 01-Oct-1945

## 2016-05-04 LAB — HM DIABETES EYE EXAM

## 2016-05-07 ENCOUNTER — Ambulatory Visit (HOSPITAL_COMMUNITY): Payer: Medicare Other | Admitting: Physical Therapy

## 2016-05-07 DIAGNOSIS — M6281 Muscle weakness (generalized): Secondary | ICD-10-CM | POA: Diagnosis not present

## 2016-05-07 DIAGNOSIS — R2681 Unsteadiness on feet: Secondary | ICD-10-CM | POA: Diagnosis not present

## 2016-05-07 DIAGNOSIS — R262 Difficulty in walking, not elsewhere classified: Secondary | ICD-10-CM | POA: Diagnosis not present

## 2016-05-07 DIAGNOSIS — M25562 Pain in left knee: Secondary | ICD-10-CM | POA: Diagnosis not present

## 2016-05-07 DIAGNOSIS — M25561 Pain in right knee: Secondary | ICD-10-CM | POA: Diagnosis not present

## 2016-05-07 DIAGNOSIS — R600 Localized edema: Secondary | ICD-10-CM | POA: Diagnosis not present

## 2016-05-07 DIAGNOSIS — I890 Lymphedema, not elsewhere classified: Secondary | ICD-10-CM | POA: Diagnosis not present

## 2016-05-07 NOTE — Therapy (Signed)
[facility] [address] [place], [place], [address] Phone: [phone]   Fax:  [phone]  Physical Therapy Treatment  Patient Details  Name: Michael [name] MRN: [id number] Date of Birth: March 07, 1946 Referring Provider: Dr. Vic Blackbird   Encounter Date: 05/07/2016      PT End of Session - 05/07/16 0943    Visit Number 19   Number of Visits 22   Date for PT Re-Evaluation 05/28/16   Authorization Type [employer] medicare (gcode on visit 45)   Authorization - Visit Number 19   Authorization - Number of Visits 28   PT Start Time 0902   PT Stop Time 0945   PT Time Calculation (min) 43 min   Equipment Utilized During Treatment Gait belt   Activity Tolerance Patient tolerated treatment well;Patient limited by fatigue   Behavior During Therapy [facility] for tasks assessed/performed      Past Medical History:  Diagnosis Date  . Arthritis   . COPD (chronic obstructive pulmonary disease) ([place])   . DDD (degenerative disc disease), lumbosacral   . Depression   . Diabetic neuropathy ([place])   . Diastolic dysfunction   . Essential hypertension   . GERD (gastroesophageal reflux disease)   . Hyperlipidemia   . Iron deficiency anemia   . Left knee DJD   . Lumbar spinal stenosis   . NASH (nonalcoholic steatohepatitis)   . Peripheral edema   . Peripheral edema 10/05/2015  . Type 2 diabetes mellitus ([place])     Past Surgical History:  Procedure Laterality Date  . BIOPSY N/A 07/21/2014   Procedure: BIOPSY;  Surgeon: Daneil Dolin, MD;  Location: [facility];  Service: Endoscopy;  Laterality: N/A;  . COLONOSCOPY  2011   [facility] [place]: normal colon, normal distal ileum  . COLONOSCOPY WITH PROPOFOL N/A 07/21/2014   Procedure: ATTEMPTED COLONOSCOPY WITH PROPOFOL-HAD TO STOP DUE TO BRADYCARDIA;  Surgeon: Daneil Dolin, MD;  Location: [facility];  Service: Endoscopy;  Laterality: N/A;  . EGD with enteroscopy  2011   [facility]: normal esophagus and stomach. Normal  duodenum, jejunum. No evidence of AVMs.   . ESOPHAGOGASTRODUODENOSCOPY (EGD) WITH PROPOFOL N/A 07/21/2014   Procedure: ESOPHAGOGASTRODUODENOSCOPY (EGD) WITH PROPOFOL;  Surgeon: Daneil Dolin, MD;  Location: [facility];  Service: Endoscopy;  Laterality: N/A;  . HERNIA REPAIR    . KNEE ARTHROSCOPY WITH MEDIAL MENISECTOMY Left 11/06/2012   Procedure: KNEE ARTHROSCOPY WITH MEDIAL MENISECTOMY;  Surgeon: Carole Civil, MD;  Location: [facility];  Service: Orthopedics;  Laterality: Left;  . Lipoma removal     Stomach  . LUMBAR LAMINECTOMY/DECOMPRESSION MICRODISCECTOMY Left 08/30/2013   Procedure: LUMBAR LAMINECTOMY/DECOMPRESSION MICRODISCECTOMY LEFT  LUMBAR TWO THREE;  Surgeon: Otilio Connors, MD;  Location: [facility];  Service: Neurosurgery;  Laterality: Left;  . SHOULDER SURGERY     Rght-rotator cuff    There were no vitals filed for this visit.      Subjective Assessment - 05/07/16 0925    Subjective Pt states he's been doing little at home.  States he is walking 1X day with his CG.  States he's still sleeping in a recliner because he cant get in/out of hospital bed.                          [facility] Adult PT Treatment/Exercise - 05/07/16 0001      Bed Mobility   Supine to Sit 6: Modified independent (Device/Increase time)  Supine to Sit Details (indicate cue type and reason) using side of bed, logroll technique   Sit to Supine 4: Min assist   Sit to Supine - Details (indicate cue type and reason) logroll technique, mod assist from therapist for LE's     Ambulation/Gait   Ambulation/Gait Yes   Ambulation/Gait Assistance 4: Min guard   Ambulation Distance (Feet) 22 Feet  6 bouts, 3',3',5',9',7', '5'   Assistive device Rolling walker   Gait Pattern Step-through pattern;Right flexed knee in stance;Left flexed knee in stance;Decreased trunk rotation;Trunk flexed   Ambulation Surface Level;Indoor   Gait Comments limited by LE fatigue/SOB today      Lumbar Exercises: Seated    Long Arc Quad on Chair 15 reps;Both   Other Seated Lumbar Exercises lateral trunk rotations with bolster in UE's, thoracic excursions 5 reps each     Lumbar Exercises: Supine   Heel Slides 5 reps   Heel Slides Limitations 2 sets each                PT Education - 05/07/16 0928    Education provided Yes   Education Details Urged to practice bed mobility/transfers to hospital bed multiple times daily as well as walking several times with CG.  Encouraged to utilize his CG for the 3 hours she is there everyday.   Person(s) Educated Patient   Methods Explanation   Comprehension Verbalized understanding          PT Short Term Goals - 04/30/16 1021      PT SHORT TERM GOAL #1   Title Pt will have a  5 cm reduction in bilateral  for improved fitting of clothing    Baseline DC-ed from lymph PT    Time 3   Period Weeks   Status Achieved     PT SHORT TERM GOAL #2   Title Pt will be able to identify the signs and symptoms of cellulitis due to prolong lymphstasis   Baseline DC-ed from lymph PT    Time 1   Period Weeks   Status Achieved     PT SHORT TERM GOAL #3   Title Pt will have a 5 cm decrease at dorsum of foot to improve ability to don shoes    Baseline DC-ed from lymph PT    Time 3   Period Weeks   Status Deferred     PT SHORT TERM GOAL #4   Title Pt will be I in remedial exercises to promote lymph circulaiton    Baseline DC-ed from lymph PT    Time 2   Period Weeks   Status Achieved     PT SHORT TERM GOAL #5   Title Pt to be able to complete bed mobility independently    Baseline 8/1- improving but still needs work    Time 2   Period Weeks   Status On-going     PT SHORT TERM GOAL #6   Title Patient to be able to stand for five minutes for improved transfers and decreased risk of falling    Baseline 8/1- improving    Time 3   Period Weeks   Status On-going     PT SHORT TERM GOAL #7   Title Patient to be able to verbally state 5/5 fall precautions in  order to show good understanding of safety and fall prevention    Time 3   Period Weeks   Status New           PT  Long Term Goals - 04/30/16 1021      PT LONG TERM GOAL #1   Title Pt will have a 7cm  reduction in fluid in bilateral LE  to promote independence of transfers. Pt to be able to transfer to bed and be able to lift his feet onto the bed on his own.    Baseline DC-ed from lymph PT    Time 6   Period Weeks   Status Achieved     PT LONG TERM GOAL #2   Title Pt to have acquired compression garment for bilateral  LE to allow maintance phase of treatment to begin    Baseline DC-ed from lymph PT    Time 6   Period Weeks   Status Achieved     PT LONG TERM GOAL #3   Title Pt to have no drainage from either LE to reduce risk of infection .   Baseline DC-ed from lymph PT    Time 3   Period Weeks   Status Achieved     PT LONG TERM GOAL #4   Title Pt life impact to decrease by at least 15 points to show improved physical/functional concerns    Time 6   Period Weeks   Status On-going     PT LONG TERM GOAL #5   Title Pt to be able to ambulate with walker for 25 feet to allow patient to walk in his apartment to allow patient to go into the restroom when his aide is not present.    Baseline 8/1- 82f today    Time 6   Period Weeks   Status Achieved     Additional Long Term Goals   Additional Long Term Goals Yes     PT LONG TERM GOAL #6   Title Patient to be able to ambulate 564fwith personal rolling walker and minimal fatigue to further improve mobility and safety at home    Time 6   Period Weeks   Status New     PT LONG TERM GOAL #7   Title Patient to be able to stand statically for at least 6 minutes with U UE support and minimal fatigue in order to improve mobilty and self-care at home    Time 6 [place] 05/07/16 0944    Clinical Impression Statement continued focus on improving functional mobiltiy and gait.   Able to complete 6 bouts of ambulation with increasing distance up to 9 feet maximally.  Pt encouraged to complete more at home.  Added seated LAQ and trunk mobility exeriises seated EOB.  Pt with no physical assist needed other than verbal cues to transfer supine to sit, however needed mod assist from therapist to help get LE's into bed with sit to supine.  Pt fatigues quickly and admits to being "lazy" at home.  Urged pt to push himself if he wants improvement.  Pt verbalized understanding.  Pt already has a life alert system in place at home.    Rehab Potential Good   PT Frequency 2x / week   PT Duration 4 weeks   PT Treatment/Interventions ADLs/Self Care Home Management;Gait training;Functional mobility training;Therapeutic activities;Therapeutic exercise;Balance training;Neuromuscular re-education;Patient/family education;Manual techniques;Energy conservation   PT Next Visit Plan continue stretches on mat in supine; strength, gait, balance, core.   Add nustep at EOS (non billable) next session to improve activity tolerance.  Consulted and Agree with Plan of Care Patient      Patient will benefit from skilled therapeutic intervention in order to improve the following deficits and impairments:  Abnormal gait, Decreased balance, Decreased activity tolerance, Decreased range of motion, Decreased strength, Increased edema, Pain  Visit Diagnosis: Difficulty in walking, not elsewhere classified  Unsteadiness on feet  Muscle weakness (generalized)  Arthralgia of both lower legs     Problem List Patient Active Problem List   Diagnosis Date Noted  . Diastolic dysfunction 84/53/6468  . Stage II pressure ulcer of sacral region 02/13/2015  . Atypical nevi 01/18/2015  . Intertriginous candidiasis 12/23/2014  . Hypothyroidism 11/30/2014  . Bradycardia 07/21/2014  . Sinus pause 07/21/2014  . Liver fibrosis ([place]) 06/29/2014  . IDA (iron deficiency anemia) 06/29/2014  . Iron deficiency  anemia 05/31/2014  . Hepatomegaly 05/25/2014  . Difficulty walking 05/03/2014  . Knee pain, chronic 05/03/2014  . Stiffness of joint, not elsewhere classified, pelvic region and thigh 05/03/2014  . Dermatitis 04/12/2014  . HNP (herniated nucleus pulposus), lumbar 08/30/2013  . Spinal stenosis of lumbar region 07/06/2013  . Carpal tunnel syndrome 04/13/2013  . Lymphedema 04/13/2013  . Status post arthroscopy of left knee 04/13/2013  . DDD (degenerative disc disease), lumbosacral 04/13/2013  . Peripheral neuropathy ([place]) 01/05/2013  . S/P arthroscopy of left knee 11/09/2012  . Osteoarthritis of left knee 10/27/2012  . Meniscus tear 10/07/2012  . Patellar tendonitis 07/01/2012  . NASH (nonalcoholic steatohepatitis) 05/22/2007  . Type II diabetes mellitus with neurological manifestations ([place]) 05/20/2007  . Hyperlipidemia 10/15/2006  . OBESITY, MORBID 10/15/2006  . Essential hypertension 10/15/2006  . GERD 10/15/2006    Teena Irani 05/07/2016, 9:50 AM  [facility] [address] [place], [place], [address] Phone: [phone]   Fax:  [phone]  Name: Michael [name] MRN: [id number] Date of Birth: 1946-04-01

## 2016-05-08 ENCOUNTER — Other Ambulatory Visit (HOSPITAL_COMMUNITY): Payer: Self-pay | Admitting: Surgery

## 2016-05-08 DIAGNOSIS — K219 Gastro-esophageal reflux disease without esophagitis: Principal | ICD-10-CM

## 2016-05-08 DIAGNOSIS — IMO0001 Reserved for inherently not codable concepts without codable children: Secondary | ICD-10-CM

## 2016-05-09 ENCOUNTER — Telehealth: Payer: Self-pay | Admitting: Cardiology

## 2016-05-09 ENCOUNTER — Ambulatory Visit (HOSPITAL_COMMUNITY): Payer: Medicare Other

## 2016-05-09 DIAGNOSIS — R2681 Unsteadiness on feet: Secondary | ICD-10-CM

## 2016-05-09 DIAGNOSIS — R262 Difficulty in walking, not elsewhere classified: Secondary | ICD-10-CM

## 2016-05-09 DIAGNOSIS — M25561 Pain in right knee: Secondary | ICD-10-CM | POA: Diagnosis not present

## 2016-05-09 DIAGNOSIS — M6281 Muscle weakness (generalized): Secondary | ICD-10-CM

## 2016-05-09 DIAGNOSIS — M25562 Pain in left knee: Secondary | ICD-10-CM | POA: Diagnosis not present

## 2016-05-09 NOTE — Telephone Encounter (Signed)
Patient wanted Dr. Harl Bowie to know that he was set up for ABI

## 2016-05-09 NOTE — Telephone Encounter (Signed)
Please call patient regarding testing that was ordered by another physician. / tg

## 2016-05-09 NOTE — Therapy (Signed)
[facility] [address] [place], [place], [address] Phone: [phone]   Fax:  [phone]  Physical Therapy Treatment  Patient Details  Name: Michael [name] MRN: [id number] Date of Birth: 1946/03/03 Referring Provider: Dr. Vic Blackbird   Encounter Date: 05/09/2016      PT End of Session - 05/09/16 1046    Visit Number 20   Number of Visits 22   Date for PT Re-Evaluation 05/28/16   Authorization Type [employer] medicare (gcode on visit 18)   Authorization - Visit Number 20   Authorization - Number of Visits 28   PT Start Time [phone]   PT Stop Time 1032   PT Time Calculation (min) 42 min   Equipment Utilized During Treatment Gait belt   Activity Tolerance Patient tolerated treatment well;Patient limited by fatigue   Behavior During Therapy [facility] for tasks assessed/performed      Past Medical History:  Diagnosis Date  . Arthritis   . COPD (chronic obstructive pulmonary disease) ([place])   . DDD (degenerative disc disease), lumbosacral   . Depression   . Diabetic neuropathy ([place])   . Diastolic dysfunction   . Essential hypertension   . GERD (gastroesophageal reflux disease)   . Hyperlipidemia   . Iron deficiency anemia   . Left knee DJD   . Lumbar spinal stenosis   . NASH (nonalcoholic steatohepatitis)   . Peripheral edema   . Peripheral edema 10/05/2015  . Type 2 diabetes mellitus ([place])     Past Surgical History:  Procedure Laterality Date  . BIOPSY N/A 07/21/2014   Procedure: BIOPSY;  Surgeon: Daneil Dolin, MD;  Location: [facility];  Service: Endoscopy;  Laterality: N/A;  . COLONOSCOPY  2011   [facility]: normal colon, normal distal ileum  . COLONOSCOPY WITH PROPOFOL N/A 07/21/2014   Procedure: ATTEMPTED COLONOSCOPY WITH PROPOFOL-HAD TO STOP DUE TO BRADYCARDIA;  Surgeon: Daneil Dolin, MD;  Location: [facility];  Service: Endoscopy;  Laterality: N/A;  . EGD with enteroscopy  2011   [facility]: normal esophagus and stomach. Normal  duodenum, jejunum. No evidence of AVMs.   . ESOPHAGOGASTRODUODENOSCOPY (EGD) WITH PROPOFOL N/A 07/21/2014   Procedure: ESOPHAGOGASTRODUODENOSCOPY (EGD) WITH PROPOFOL;  Surgeon: Daneil Dolin, MD;  Location: [facility];  Service: Endoscopy;  Laterality: N/A;  . HERNIA REPAIR    . KNEE ARTHROSCOPY WITH MEDIAL MENISECTOMY Left 11/06/2012   Procedure: KNEE ARTHROSCOPY WITH MEDIAL MENISECTOMY;  Surgeon: Carole Civil, MD;  Location: [facility];  Service: Orthopedics;  Laterality: Left;  . Lipoma removal     Stomach  . LUMBAR LAMINECTOMY/DECOMPRESSION MICRODISCECTOMY Left 08/30/2013   Procedure: LUMBAR LAMINECTOMY/DECOMPRESSION MICRODISCECTOMY LEFT  LUMBAR TWO THREE;  Surgeon: Otilio Connors, MD;  Location: [place] [facility];  Service: Neurosurgery;  Laterality: Left;  . SHOULDER SURGERY     Rght-rotator cuff    There were no vitals filed for this visit.      Subjective Assessment - 05/09/16 0952    Subjective Pt stated he still hasn't been able to get into bed, reports his care giver isn't able to help due to decreased time available.  Current pain scale 6/10 Bil knees.   Pertinent History CHF, DM, HTN, S/P lumbar surgeryL2-3, lymphedema, COPD,    Patient Stated Goals improve mobility, get back to walking    Currently in Pain? Yes   Pain Score 6    Pain Location Leg   Pain Orientation Right;Left   Pain Descriptors / Indicators Sore  Pain Type Chronic pain   Pain Radiating Towards Rt hip and knee pain down to ankle   Pain Onset More than a month ago   Pain Frequency Constant               [facility] Adult PT Treatment/Exercise - 05/09/16 0001      Bed Mobility   Supine to Sit 6: Modified independent (Device/Increase time)   Supine to Sit Details (indicate cue type and reason) logrolling   Sit to Supine 4: Min assist   Sit to Supine - Details (indicate cue type and reason) log rolling, min A from therapist for LE's     Ambulation/Gait   Ambulation/Gait Yes   Ambulation/Gait Assistance 4:  Min guard   Ambulation Distance (Feet) 51 Feet  4 rounds 12', 14', 16', 9'   Assistive device Rolling walker   Gait Pattern Step-through pattern;Right flexed knee in stance;Left flexed knee in stance;Decreased trunk rotation;Trunk flexed   Gait Comments limited by LE fatigue/SOB today      Lumbar Exercises: Aerobic   Stationary Bike attempted getting onto Nustep, able to transfer min guard, unable to complete due to tight musculature and weakness     Lumbar Exercises: Seated   Long Arc Quad on Chair 15 reps;Both   Other Seated Lumbar Exercises lateral trunk rotations with bolster in UE's, thoracic excursions 5 reps each     Lumbar Exercises: Supine   Heel Slides 5 reps   Heel Slides Limitations 2 sets each   Other Supine Lumbar Exercises AAROM Supine Abd 5 reps each   Other Supine Lumbar Exercises supine diver extension stretch 1x10, UEs overhead to promote extension      Knee/Hip Exercises: Seated   Long Arc Quad Strengthening;Both;10 reps                  PT Short Term Goals - 04/30/16 1021      PT SHORT TERM GOAL #1   Title Pt will have a  5 cm reduction in bilateral  for improved fitting of clothing    Baseline DC-ed from lymph PT    Time 3   Period Weeks   Status Achieved     PT SHORT TERM GOAL #2   Title Pt will be able to identify the signs and symptoms of cellulitis due to prolong lymphstasis   Baseline DC-ed from lymph PT    Time 1   Period Weeks   Status Achieved     PT SHORT TERM GOAL #3   Title Pt will have a 5 cm decrease at dorsum of foot to improve ability to don shoes    Baseline DC-ed from lymph PT    Time 3   Period Weeks   Status Deferred     PT SHORT TERM GOAL #4   Title Pt will be I in remedial exercises to promote lymph circulaiton    Baseline DC-ed from lymph PT    Time 2   Period Weeks   Status Achieved     PT SHORT TERM GOAL #5   Title Pt to be able to complete bed mobility independently    Baseline 8/1- improving but still  needs work    Time 2   Period Weeks   Status On-going     PT SHORT TERM GOAL #6   Title Patient to be able to stand for five minutes for improved transfers and decreased risk of falling    Baseline 8/1- improving    Time  3   Period Weeks   Status On-going     PT SHORT TERM GOAL #7   Title Patient to be able to verbally state 5/5 fall precautions in order to show good understanding of safety and fall prevention    Time 3   Period Weeks   Status New           PT Long Term Goals - 04/30/16 1021      PT LONG TERM GOAL #1   Title Pt will have a 7cm  reduction in fluid in bilateral LE  to promote independence of transfers. Pt to be able to transfer to bed and be able to lift his feet onto the bed on his own.    Baseline DC-ed from lymph PT    Time 6   Period Weeks   Status Achieved     PT LONG TERM GOAL #2   Title Pt to have acquired compression garment for bilateral  LE to allow maintance phase of treatment to begin    Baseline DC-ed from lymph PT    Time 6   Period Weeks   Status Achieved     PT LONG TERM GOAL #3   Title Pt to have no drainage from either LE to reduce risk of infection .   Baseline DC-ed from lymph PT    Time 3   Period Weeks   Status Achieved     PT LONG TERM GOAL #4   Title Pt life impact to decrease by at least 15 points to show improved physical/functional concerns    Time 6   Period Weeks   Status On-going     PT LONG TERM GOAL #5   Title Pt to be able to ambulate with walker for 25 feet to allow patient to walk in his apartment to allow patient to go into the restroom when his aide is not present.    Baseline 8/1- 36f today    Time 6   Period Weeks   Status Achieved     Additional Long Term Goals   Additional Long Term Goals Yes     PT LONG TERM GOAL #6   Title Patient to be able to ambulate 520fwith personal rolling walker and minimal fatigue to further improve mobility and safety at home    Time 6   Period Weeks   Status New      PT LONG TERM GOAL #7   Title Patient to be able to stand statically for at least 6 minutes with U UE support and minimal fatigue in order to improve mobilty and self-care at home    Time 6   Period Weeks   Status New               Plan - 05/09/16 1047    Clinical Impression Statement Continued session focus on improving functional mobiltiy and gait training.  Pt improving activity tolerance wiht ability to ambulate increased distance up to 16 feet maximally.  Pt continues to require assistance wiht LE with sit to supine, able to complete supine to sit independently with log roll technqiue.  Pt encouraged to complete more at home for maximal benefits.  Pt continues to fatigue quickly and admits to being "lazy" at home.  Pt continues to present with severe weakness and tight soft tissue musculature especially trunk and hip flexors.  Attempted to begin Nustep for activity tolerance and strenghtening, pt able to transfer to seat min guard though unable to place  feet due to weakness and muscle tightness restrictions.  Pt reports he has portable bike at home, encouraged pt to begin at home to improve mobility and strengthening.   Rehab Potential Good   PT Frequency 2x / week   PT Duration 4 weeks   PT Treatment/Interventions ADLs/Self Care Home Management;Gait training;Functional mobility training;Therapeutic activities;Therapeutic exercise;Balance training;Neuromuscular re-education;Patient/family education;Manual techniques;Energy conservation   PT Next Visit Plan continue stretches on mat in supine; strength, gait, balance, core.   Add nustep at EOS (non billable) when able to improve activity tolerance.    PT Home Exercise Plan encouraged transfer to bed, begin stationary bike at home and increase frequency with LE HEP.        Patient will benefit from skilled therapeutic intervention in order to improve the following deficits and impairments:  Abnormal gait, Decreased balance, Decreased  activity tolerance, Decreased range of motion, Decreased strength, Increased edema, Pain  Visit Diagnosis: Difficulty in walking, not elsewhere classified  Unsteadiness on feet  Muscle weakness (generalized)     Problem List Patient Active Problem List   Diagnosis Date Noted  . Diastolic dysfunction 36/14/4315  . Stage II pressure ulcer of sacral region 02/13/2015  . Atypical nevi 01/18/2015  . Intertriginous candidiasis 12/23/2014  . Hypothyroidism 11/30/2014  . Bradycardia 07/21/2014  . Sinus pause 07/21/2014  . Liver fibrosis ([place]) 06/29/2014  . IDA (iron deficiency anemia) 06/29/2014  . Iron deficiency anemia 05/31/2014  . Hepatomegaly 05/25/2014  . Difficulty walking 05/03/2014  . Knee pain, chronic 05/03/2014  . Stiffness of joint, not elsewhere classified, pelvic region and thigh 05/03/2014  . Dermatitis 04/12/2014  . HNP (herniated nucleus pulposus), lumbar 08/30/2013  . Spinal stenosis of lumbar region 07/06/2013  . Carpal tunnel syndrome 04/13/2013  . Lymphedema 04/13/2013  . Status post arthroscopy of left knee 04/13/2013  . DDD (degenerative disc disease), lumbosacral 04/13/2013  . Peripheral neuropathy ([place]) 01/05/2013  . S/P arthroscopy of left knee 11/09/2012  . Osteoarthritis of left knee 10/27/2012  . Meniscus tear 10/07/2012  . Patellar tendonitis 07/01/2012  . NASH (nonalcoholic steatohepatitis) 05/22/2007  . Type II diabetes mellitus with neurological manifestations ([place]) 05/20/2007  . Hyperlipidemia 10/15/2006  . OBESITY, MORBID 10/15/2006  . Essential hypertension 10/15/2006  . GERD 10/15/2006   Ihor Austin, [place]; [place]  Aldona Lento 05/09/2016, 11:07 AM  [place], [place], [address] Phone: [phone]   Fax:  [phone]  Name: Michael [name] MRN: [id number] Date of Birth: Feb 12, 1946

## 2016-05-09 NOTE — Telephone Encounter (Signed)
Patient states he had blood drawn on 03/25/16

## 2016-05-09 NOTE — Telephone Encounter (Signed)
Im ok with that for him. Did he ever get that blood work that Dr Moshe Cipro ordered for him, its quite important  J Karl Erway MD

## 2016-05-11 ENCOUNTER — Other Ambulatory Visit: Payer: Self-pay | Admitting: Family Medicine

## 2016-05-11 ENCOUNTER — Other Ambulatory Visit: Payer: Self-pay | Admitting: Adult Health

## 2016-05-13 ENCOUNTER — Ambulatory Visit (HOSPITAL_COMMUNITY)
Admission: RE | Admit: 2016-05-13 | Discharge: 2016-05-13 | Disposition: A | Discharge status: Home / Self Care | Payer: Medicare Other | Source: Ambulatory Visit | Attending: Surgery | Admitting: Surgery

## 2016-05-13 ENCOUNTER — Other Ambulatory Visit (HOSPITAL_COMMUNITY): Payer: Medicare Other

## 2016-05-13 DIAGNOSIS — K219 Gastro-esophageal reflux disease without esophagitis: Secondary | ICD-10-CM

## 2016-05-13 DIAGNOSIS — Z0389 Encounter for observation for other suspected diseases and conditions ruled out: Secondary | ICD-10-CM | POA: Diagnosis not present

## 2016-05-13 DIAGNOSIS — R600 Localized edema: Secondary | ICD-10-CM | POA: Diagnosis not present

## 2016-05-13 DIAGNOSIS — IMO0001 Reserved for inherently not codable concepts without codable children: Secondary | ICD-10-CM

## 2016-05-13 NOTE — Telephone Encounter (Signed)
Refill appropriate and filled per protocol. 

## 2016-05-14 ENCOUNTER — Encounter: Payer: Self-pay | Admitting: *Deleted

## 2016-05-14 ENCOUNTER — Other Ambulatory Visit: Payer: Self-pay | Admitting: *Deleted

## 2016-05-14 ENCOUNTER — Encounter (HOSPITAL_COMMUNITY): Payer: Medicare Other

## 2016-05-14 ENCOUNTER — Telehealth: Payer: Self-pay | Admitting: *Deleted

## 2016-05-14 ENCOUNTER — Telehealth (HOSPITAL_COMMUNITY): Payer: Self-pay

## 2016-05-14 DIAGNOSIS — L89312 Pressure ulcer of right buttock, stage 2: Secondary | ICD-10-CM | POA: Diagnosis not present

## 2016-05-14 MED ORDER — NYSTATIN 100000 UNIT/GM EX POWD: Freq: Four times a day (QID) | CUTANEOUS | 0 refills | Status: DC

## 2016-05-14 NOTE — Patient Outreach (Signed)
[facility]) Care Management   05/14/2016  Michael [name] November 24, 1945 [id number]  Michael [name] is an 70 y.o. male  with chronic diastolic heart failure with EF 60-65%, history of bradycardia, type II Diabetes, Hypertension, Iron Deficiency Anemia, NASH + Hepatomegaly, and degeneratove disc disease s/p surgery of L2-L3.   Michael [name] was admitted to the hospital in February with Acute/Chronic Diastolic Heart Failure and worsening lymphedema. Thereafter, he spent 21 days at [facility]. Michael [name] was referred to [facility] for assistance with management of CHF.   [facility] Care Management has been following Michael [name] in the community since his discharge from SNF. Focus of care management services has been around CHF and DM Disease management with particular attention to volume management and peripheral edema/lymphedema and lower extremity skin ailments.   Michael [name] attended the lymphedema treatment program at [facility] PT department and he is very pleased with the improvement in his lower extremity swelling and skin condition. He is now participating in outpatient PT for strengthening,conditioning, and ambulation.   Michael [name] continues to have difficulty with skin problems related to immobility and long periods of sitting either in his living room chair or wheelchair.   Subjective: "I really do feel better and I'm so proud to lose all that weight. My skin is still bothering me on my bottom though."  Objective:  BP 114/72   Pulse 73   Wt 188 lb (85.3 kg)   SpO2 98%   BMI 36.72 kg/m   Review of Systems  Constitutional: Negative.   HENT: Negative.   Eyes: Negative.   Respiratory: Negative for cough, sputum production, shortness of breath and wheezing.   Cardiovascular: Negative.  Negative for leg swelling.  Gastrointestinal: Negative.   Genitourinary: Negative.   Musculoskeletal: Positive for myalgias. Negative for falls.  Skin: Positive for itching and  rash.       New urticarial slightly raised circular red rash in right abdominal fold  Neurological: Positive for focal weakness.       Unable to bear weight on legs without assistance x 1-2; mild contraction in both knees and lower back preventing erect standing or ambulation without assistance of device +/- assistance  Psychiatric/Behavioral: Negative.     Physical Exam  Constitutional: He is oriented to person, place, and time. He appears well-developed and well-nourished. He is active.  Non-toxic appearance. He does not have a sickly appearance. He does not appear ill.  Cardiovascular: Normal rate and regular rhythm.   Respiratory: Effort normal and breath sounds normal. No respiratory distress. He has no decreased breath sounds.  GI: Soft. Bowel sounds are normal. There is no tenderness.  Genitourinary:     Musculoskeletal: He exhibits deformity.  Mild contracture of bilateral knees and lower back  Neurological: He is alert and oriented to person, place, and time.  Skin: Skin is warm and dry. Rash noted.     Skin of sacrum slightly red/unbroken; small area of open skin on back of right scrotum  Psychiatric: He has a normal mood and affect. His speech is normal and behavior is normal. Judgment and thought content normal. Cognition and memory are normal.    Encounter Medications:   Outpatient Encounter Prescriptions as of 05/14/2016  Medication Sig  . ACCU-CHEK AVIVA PLUS test strip USE AS DIRECTED TWICE DAILY.  Marland Kitchen aspirin EC 81 MG tablet Take 81 mg by mouth daily.  . B-D ULTRAFINE III SHORT PEN 31G X 8 MM MISC USE AS DIRECTED  DAILY WITH LANTUS.  Marland Kitchen benazepril (LOTENSIN) 40 MG tablet TAKE ONE TABLET BY MOUTH DAILY.  . bumetanide (BUMEX) 2 MG tablet Take 1.5 tablets (3 mg total) by mouth 2 (two) times daily.  Marland Kitchen BYSTOLIC 10 MG tablet TAKE ONE TABLET BY MOUTH DAILY.  . clobetasol cream (TEMOVATE) 0.05 % APPLY TO AFFECTED AREA 2 TIMES DAILY.  . diazepam (VALIUM) 5 MG tablet TAKE 1  TABLET BY MOUTH AT BEDTIME AS NEEDED FOR ANXIETY.  . diclofenac sodium (VOLTAREN) 1 % GEL Apply 4 g topically 4 (four) times daily.  . Ferrous Sulfate (IRON) 325 (65 FE) MG TABS Take 1 tablet by mouth daily.   Marland Kitchen gabapentin (NEURONTIN) 400 MG capsule TAKE ONE CAPSULE BY MOUTH THREE TIMES DAILY.  Marland Kitchen LANTUS SOLOSTAR 100 UNIT/ML Solostar Pen INJECT 30 UNITS SUBCUTANEOUSLY AT BEDTIME.  Marland Kitchen levothyroxine (SYNTHROID, LEVOTHROID) 88 MCG tablet TAKE 1 TABLET BY MOUTH DAILY BEFORE BREAKFAST.  . Magnesium 400 MG TABS Take 400 mg by mouth 2 (two) times daily.  . meloxicam (MOBIC) 15 MG tablet TAKE ONE TABLET BY MOUTH DAILY WITH FOOD.  Marland Kitchen metFORMIN (GLUCOPHAGE) 1000 MG tablet TAKE ONE TABLET BY MOUTH TWICE DAILY WITH A MEAL.  Marland Kitchen metolazone (ZAROXOLYN) 2.5 MG tablet Take 1 tablet (2.5 mg total) by mouth daily.  Marland Kitchen nystatin cream (MYCOSTATIN) Apply 1 application topically 2 (two) times daily.  Marland Kitchen omeprazole (PRILOSEC) 20 MG capsule TAKE ONE CAPSULE BY MOUTH TWICE DAILY.  Marland Kitchen oxyCODONE-acetaminophen (PERCOCET) 7.5-325 MG tablet Take one tablet by mouth every 6 hours as needed for severe pain. Max APAP 3gm/24hrs from all sources  . potassium chloride (K-DUR) 10 MEQ tablet Take 1 tablet (10 mEq total) by mouth 2 (two) times daily.  . pravastatin (PRAVACHOL) 40 MG tablet TAKE ONE TABLET BY MOUTH DAILY.   Assessment:   70 year old gentleman living in [place] with CHF, DM, and history of lower extremity lymphedema and peripheral edema related to CHF.   Chronic Health Condition (CHF)- Michael [name] weight on discharge from acute care setting was 195#. His weight quickly went up to as high as 231# after discharge. He is on an aggressive diuretic regimen and outpatient lymphedema treatment which has helped. Michael [name] weight is #196 on his last PT visit and today was 188# on his home scale. He denies shortness of breath, chest pain, worsened fatigue, or any symptoms. Mr. Nephew is taking diuretics and all other medications  as prescribed. He is seeing his cardiologist as scheduled. He has 1+ pre-tibial edema which is baseline for Michael [name].   Acute Health Condition (BLE lymphedema) - Michael [name] lymphedema is drastically improved after outpatient treatment in the physical therapy department at [facility]. Today, he has only mild pre-tibial edema. His skin is discolored from just below the knees to ankles bilaterally and circumferentially. There is no warmth, redness, drainage, or interruption of skin integrity.   Michael [name] had lower extremity doppler/US on 05/13/16 revealing the following:  IMPRESSION: 1. No evidence of a deep venous thrombosis. 2. No evidence of deep or superficial venous insufficiency. 3. Bilateral superficial subcutaneous edema.  Acute Health Condition (interruption in skin integrity) - Michael [name] has one small area of ulceration on the right scrotum and slight redness of the sacral skin. He is applying antifungal cream as prescribed by Dr. Buelah [name].  Michael [name] has new raised red urticarial rash of the right abdominal fold. This was reported to Dr. Buelah [name] today.   Chronic Health Condition (DM) - Michael [name]  admittedly has not been as rigorous in his attention to Diabetes Management since he began having so much trouble with his legs. His most recent 30 day average is 133 (n=14). He is taking medications as prescribed and checking his cbg's as recommended. He is refocusing his efforts on Diabetes management. We reviewed his prescribed carb modified diet.   Plan:   Michael [name] will continue twice weekly visits to Outpatient PT for strengthening, conditioning, ambulation, balance, and gait training.   Mr. Massi will attend all scheduled provider appointments.   Mr. Kostelnik will take all medications as prescribed and will check cbg's daily, recording and calling for findings outside established parameters.   Mr. Soley will report new or worsening skin symptoms.  Mr.  Rogue Bussing weigh himself daily and record, calling for weight gain of 3# overnight or 5# in a week, increased fatigue/swelling, or shortness of breath/chest pain, or any other new or worsened symptom.   I will see Mr. Stapel at home next month for a routine visit and follow up of DM and lymphedema management.   [facility] CM Care Plan Problem One   Flowsheet Row Most Recent Value  Care Plan Problem One  Acute/Chronic Health Condition (Skin Tear)   Role Documenting the Problem One  Care Management Coordinator  Care Plan for Problem One  Active  THN Long Term Goal (31-90 days)  Over the next 60 days, patient will verbalize understanding of plan of care for treatment of skin condition  THN Long Term Goal Start Date  04/15/16  Interventions for Problem One Long Term Goal  Performed detailed assessment via phone with patient regarding report of new skin condition (recurrent but new/different from most recent skin problems),  notified primary care provider of skin condition concerns  THN CM Short Term Goal #1 (0-30 days)  Over the next 3 days, patient will verbalize understanding of immediate plan to address symptoms related to skin condition  THN CM Short Term Goal #1 Start Date  04/15/16  [facility] CM Short Term Goal #1 Met Date  04/30/16  THN CM Short Term Goal #2 (0-30 days)  Over the next 30 days, patient will report any new skin concerns, even discomfort, in an effort to prevent further skin breakdown  THN CM Short Term Goal #2 Start Date  05/14/16  Interventions for Short Term Goal #2  reviewed signs and symptoms of worsening skin condition that might warrant call to provider    [facility] CM Care Plan Problem Two   Flowsheet Row Most Recent Value  Care Plan Problem Two  Chronic Health Condition Self Health Management Strategy Deficits  Role Documenting the Problem Two  Care Management Coordinator  Care Plan for Problem Two  Active  Interventions for Problem Two Long Term Goal   Reviewed with patient importance  of intentional long term self health management plan   THN Long Term Goal (31-90) days  Over the next 31 days, patient will create written long term plan for self health management of CHF  THN Long Term Goal Start Date  05/14/16  THN CM Short Term Goal #1 (0-30 days)  Over the next 30 days, patient will keep daily weights in central location with long term self health management plan and trigger list for provider call  THN CM Short Term Goal #1 Start Date  05/14/16  Interventions for Short Term Goal #2   Reviewed importance of ongoing daily weights and calling for weight gain of 3# overnight or 5# in a week  or edema  THN CM Short Term Goal #2 (0-30 days)  Over the next 30 days, patient will verbalize understanding of signs and symptoms of worsening CHF  THN CM Short Term Goal #2 Start Date  05/14/16  Interventions for Short Term Goal #2  Reviewed signs adn symptoms of worsening CHF with patient,  Emmi educational materials prescribed      [place] Care Management  [phone]

## 2016-05-14 NOTE — Telephone Encounter (Signed)
-----   Message from [name], MD sent at 05/14/2016  4:25 PM EDT ----- Send in nystatin powsder for beneath pannus  ----- Message ----- From: Clerance Lav, RN Sent: 05/14/2016   3:59 PM To: [name], MD  Hi Dr. Buelah Manis,   I visited Mr. Hildebran today and will forward to you my full assessment note when completed.   I assessed Mr. Scheidt skin and found that his legs look FANTASTIC, especially considering where we started. He had bilateral LE doppler/US yesterday and I was pleasantly surprised at the results. He has only trace edema in the feet and the skin is intact. He has mild redness over the sacrum but no disruption of skin integrity. He does have a small ulceration of the right scrotum but edges are granulating and the skin does not appear infected or worsening. He is applying nystatin cream as prescribed by you.   Mr. Tumminello showed me an area of skin in the fold of his right abdomen that he say has been itching. He isn't able to see it because of location. It appears to me to be yeast. There are 2 small red raised patches right in the abdominal fold. I think he could easily apply a cream or powder on his on or with the assistance of his CNA who comes daily, if you choose to prescribe any such treatment.   I am happy to follow up with Mr. Deckard and as promised, will forward you my completed visit note asap.   Thank you,  [facility]  [phone]

## 2016-05-14 NOTE — Telephone Encounter (Signed)
Prescription sent to pharmacy.

## 2016-05-14 NOTE — Telephone Encounter (Signed)
Pt cx this week's appts... He said that he was galded really bad.

## 2016-05-16 ENCOUNTER — Encounter (HOSPITAL_COMMUNITY): Payer: Medicare Other | Admitting: Physical Therapy

## 2016-05-16 DIAGNOSIS — R600 Localized edema: Secondary | ICD-10-CM | POA: Diagnosis not present

## 2016-05-16 DIAGNOSIS — I890 Lymphedema, not elsewhere classified: Secondary | ICD-10-CM | POA: Diagnosis not present

## 2016-05-17 ENCOUNTER — Other Ambulatory Visit: Payer: Self-pay | Admitting: Family Medicine

## 2016-05-17 NOTE — Telephone Encounter (Signed)
Refill appropriate and filled per protocol. 

## 2016-05-18 ENCOUNTER — Other Ambulatory Visit: Payer: Self-pay | Admitting: Family Medicine

## 2016-05-20 IMAGING — MR MR LUMBAR SPINE W/O CM
4 of 5 series · 20 of 48 positions shown · non-contrast
Comparison: Lumbar MRI 06/21/2013

CLINICAL DATA: Spinal stenosis lumbar spine. Bilateral leg
weakness.

EXAM:
MRI LUMBAR SPINE WITHOUT CONTRAST
TECHNIQUE: Multiplanar, multisequence MR imaging of the lumbar spine was
performed. No intravenous contrast was administered.

[Series 6: T2 · sagittal · 4.0mm · 0.73mm/px · 6 of 15 slices shown (1 of 2)]
[im 1/15]
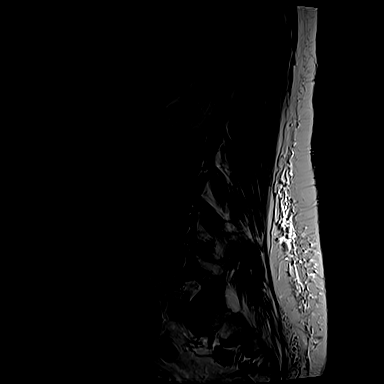
[im 3/15]
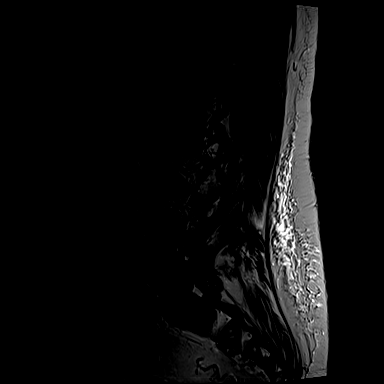
[im 6/15]
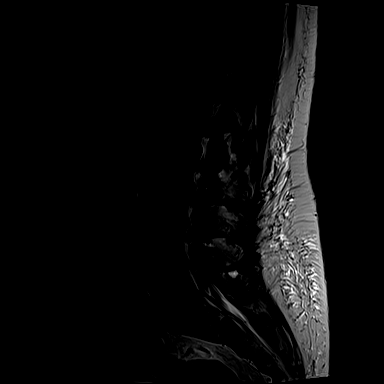
[im 9/15]
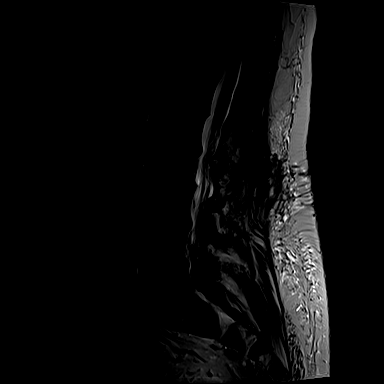
[im 12/15]
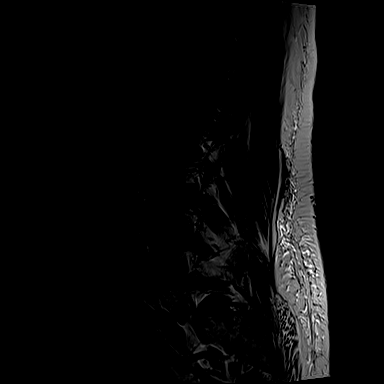
[im 15/15]
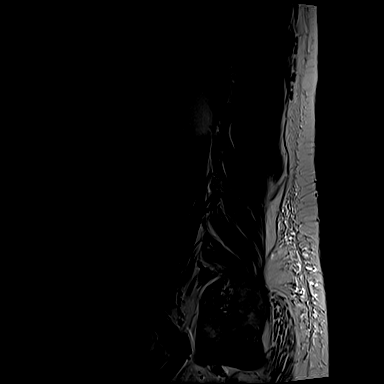

[Series 8: T1 · sagittal · 4.0mm · 0.88mm/px · 3 of 15 slices shown (1 of 2)]
[im 3/15]
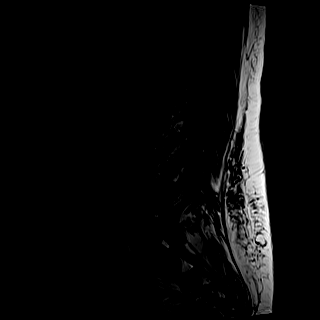
[im 9/15]
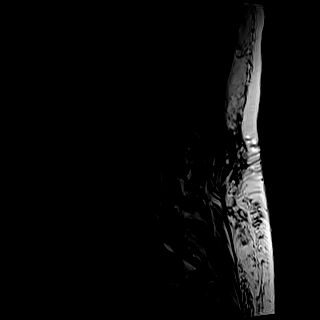
[im 15/15]
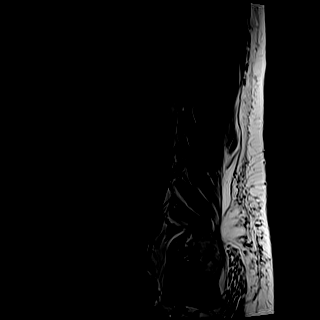

[Series 9: T1 · axial · 4.0mm · 0.56mm/px · z∈[+10,+154]mm · 3 of 38 slices shown (2 of 2)]
[im 6/38]
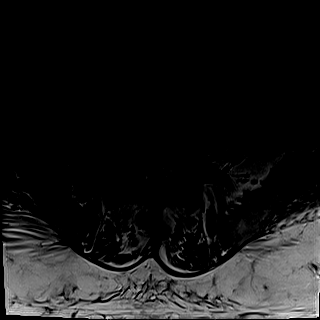
[im 19/38]
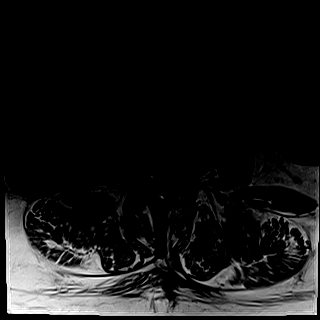
[im 32/38]
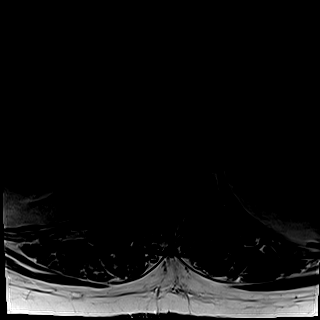

[Series 10: T2 · axial · 4.0mm · 0.28mm/px · z∈[-21,+154]mm · 8 of 39 slices shown (2 of 2)]
[im 1/39]
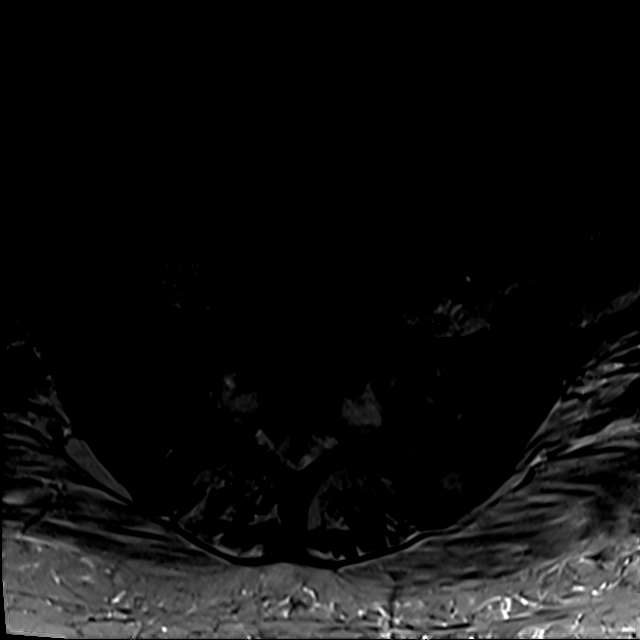
[im 6/39]
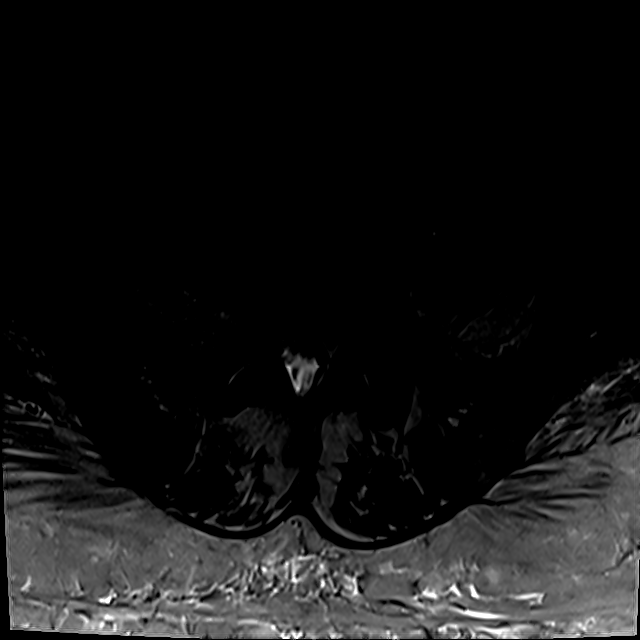
[im 11/39]
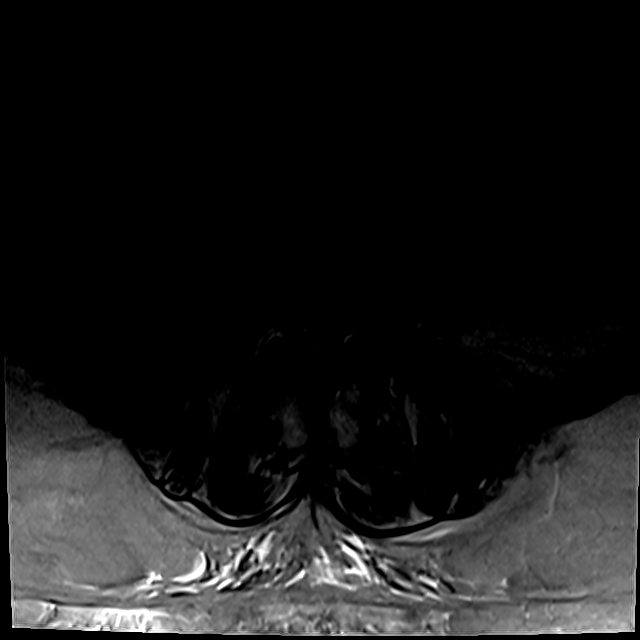
[im 17/39]
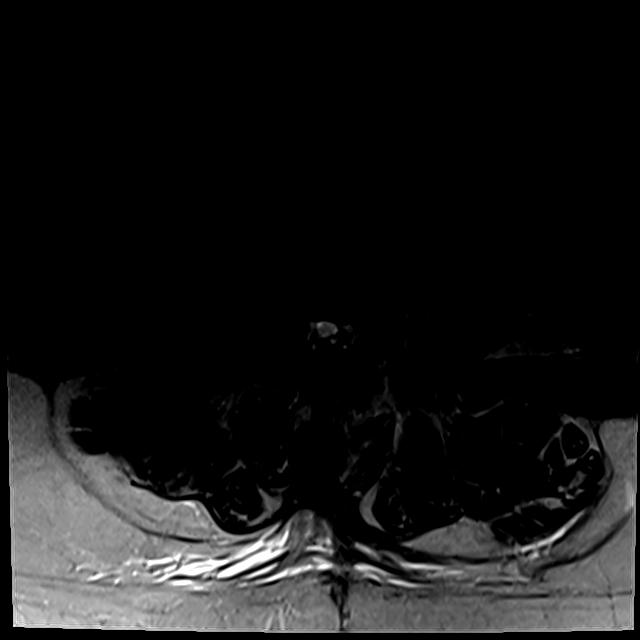
[im 20/39]
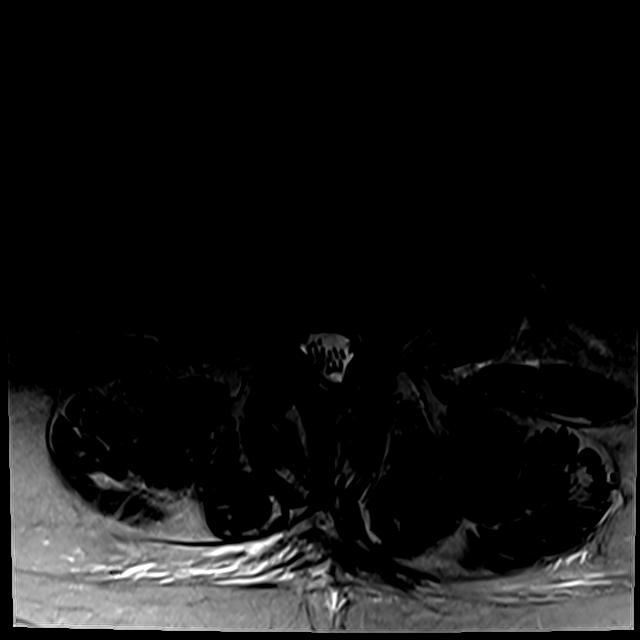
[im 22/39]
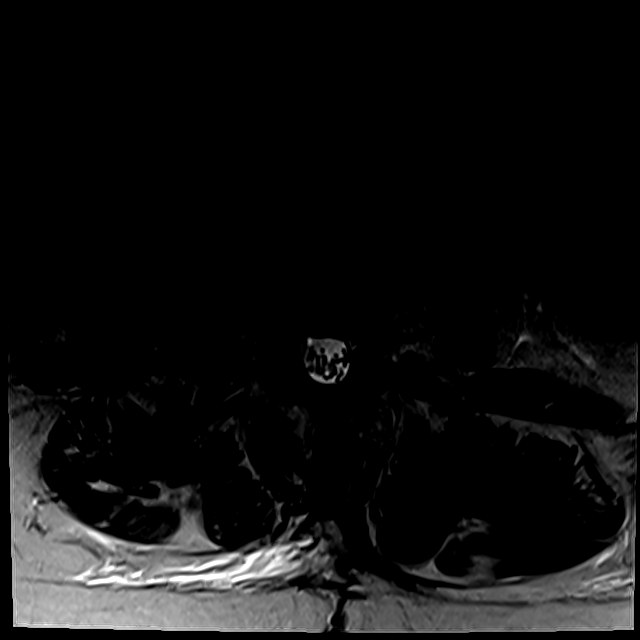
[im 28/39]
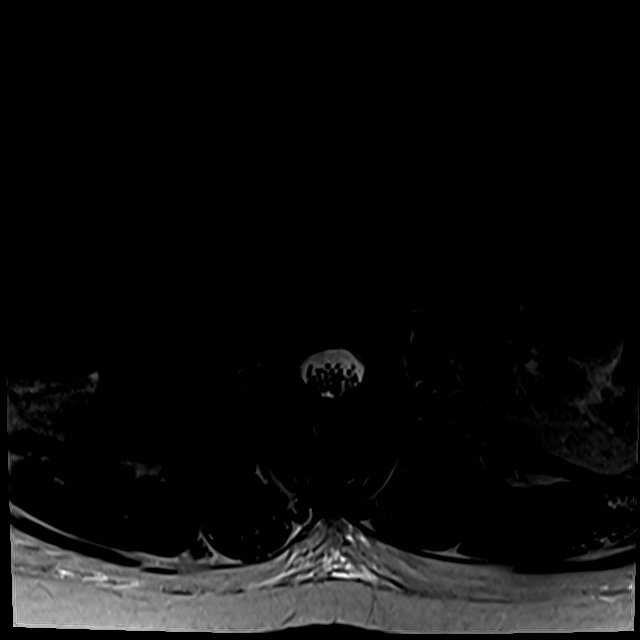
[im 33/39]
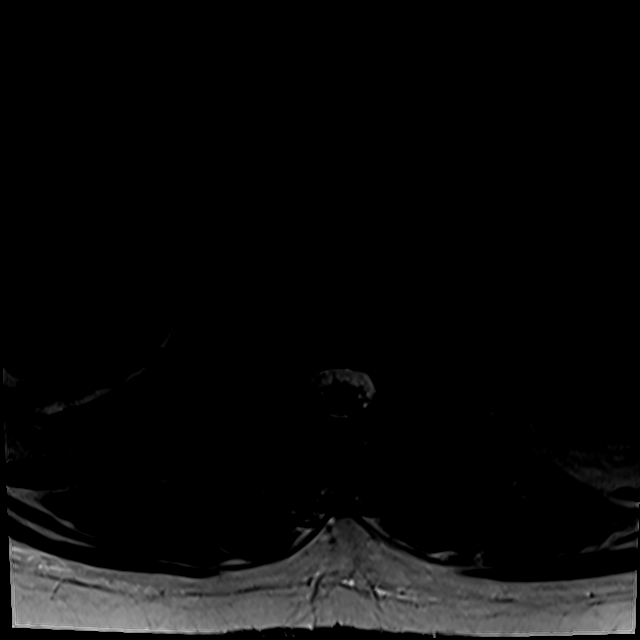

[20 of 48 positions shown; findings below may reference images not displayed]

FINDINGS: Large area of abnormal marrow signal throughout most of the L3
vertebral body is unchanged from 0414. This is hypo intense relative
to bone marrow on T1 and T2.

12 mm well-defined hypo intensity in the L1 vertebral body
unchanged.

Ill-defined bone marrow hypo intensity on the right at T10 and T11
is felt to be artifact from decreased signal related to patient
positioning.

Negative for fracture. Conus medullaris is normal and terminates at
mid L1

T12-L1: Disc bulging and mild spurring without significant spinal
stenosis

L1-2: Diffuse disc bulging and spurring. Mild facet degeneration.
Mild spinal stenosis. Progression of degenerative change and
stenosis since prior study

L2-3: Moderate disc degeneration with mild progression. Diffuse disc
bulging and spurring. Mild facet degeneration. Mild spinal stenosis
and mild foraminal stenosis bilaterally. Interval improvement in
left paracentral disc protrusion.

L3-4: Disc degeneration with disc bulging and mild spurring. Mild
spinal stenosis. Mild facet degeneration.

L4-5: Mild disc bulging and moderate facet degeneration. Moderate
spinal stenosis with progression from the prior study.

L5-S1: Normal disc. Bilateral facet hypertrophy without spinal or
foraminal stenosis.
IMPRESSION: Indeterminate bone marrow lesions at L1 and L3 are unchanged from
0414. These are not characteristic of hemangiomata. Metastatic
disease can have a similar appearance but lack of known primary and
lack of interval change over 2 years suggests they are benign. There
appears to be some artifactual marrow abnormality in the lower
thoracic spine on the right.

Progressive disc degeneration and mild spinal stenosis L1-2

Progressive disc degeneration L2-3 with disc bulging and spurring.
Improvement in left paracentral disc protrusion. Mild spinal
stenosis and mild foraminal stenosis bilaterally.

Disc degeneration and mild spondylosis and mild spinal stenosis L3-4
unchanged

Moderate spinal stenosis L4-5 has progressed in the interval.

## 2016-05-21 ENCOUNTER — Ambulatory Visit (HOSPITAL_COMMUNITY): Payer: Medicare Other | Admitting: Physical Therapy

## 2016-05-21 ENCOUNTER — Telehealth: Payer: Self-pay | Admitting: *Deleted

## 2016-05-21 ENCOUNTER — Other Ambulatory Visit: Payer: Self-pay | Admitting: *Deleted

## 2016-05-21 DIAGNOSIS — M6281 Muscle weakness (generalized): Secondary | ICD-10-CM

## 2016-05-21 DIAGNOSIS — M25561 Pain in right knee: Secondary | ICD-10-CM | POA: Diagnosis not present

## 2016-05-21 DIAGNOSIS — N179 Acute kidney failure, unspecified: Secondary | ICD-10-CM

## 2016-05-21 DIAGNOSIS — M25562 Pain in left knee: Secondary | ICD-10-CM | POA: Diagnosis not present

## 2016-05-21 DIAGNOSIS — R2681 Unsteadiness on feet: Secondary | ICD-10-CM

## 2016-05-21 DIAGNOSIS — R262 Difficulty in walking, not elsewhere classified: Secondary | ICD-10-CM | POA: Diagnosis not present

## 2016-05-21 NOTE — Therapy (Signed)
[facility] [address] [place], [place], [address] Phone: [phone]   Fax:  [phone]  Physical Therapy Treatment  Patient Details  Name: Michael [name] MRN: [id number] Date of Birth: 02-Apr-1946 Referring Provider: Dr. Vic Blackbird   Encounter Date: 05/21/2016      PT End of Session - 05/21/16 1039    Visit Number 21   Number of Visits 22   Date for PT Re-Evaluation 05/28/16   Authorization Type [employer] medicare (gcode on visit 18)   Authorization - Visit Number 21   Authorization - Number of Visits 28   PT Start Time [phone]   PT Stop Time 1030   PT Time Calculation (min) 43 min   Equipment Utilized During Treatment Gait belt   Activity Tolerance Patient limited by fatigue;Patient limited by pain   Behavior During Therapy [facility] for tasks assessed/performed      Past Medical History:  Diagnosis Date  . Arthritis   . COPD (chronic obstructive pulmonary disease) ([place])   . DDD (degenerative disc disease), lumbosacral   . Depression   . Diabetic neuropathy ([place])   . Diastolic dysfunction   . Essential hypertension   . GERD (gastroesophageal reflux disease)   . Hyperlipidemia   . Iron deficiency anemia   . Left knee DJD   . Lumbar spinal stenosis   . NASH (nonalcoholic steatohepatitis)   . Peripheral edema   . Peripheral edema 10/05/2015  . Type 2 diabetes mellitus ([place])     Past Surgical History:  Procedure Laterality Date  . BIOPSY N/A 07/21/2014   Procedure: BIOPSY;  Surgeon: Daneil Dolin, MD;  Location: [facility];  Service: Endoscopy;  Laterality: N/A;  . COLONOSCOPY  2011   [facility]: normal colon, normal distal ileum  . COLONOSCOPY WITH PROPOFOL N/A 07/21/2014   Procedure: ATTEMPTED COLONOSCOPY WITH PROPOFOL-HAD TO STOP DUE TO BRADYCARDIA;  Surgeon: Daneil Dolin, MD;  Location: [facility];  Service: Endoscopy;  Laterality: N/A;  . EGD with enteroscopy  2011   [facility]: normal esophagus and stomach. Normal duodenum,  jejunum. No evidence of AVMs.   . ESOPHAGOGASTRODUODENOSCOPY (EGD) WITH PROPOFOL N/A 07/21/2014   Procedure: ESOPHAGOGASTRODUODENOSCOPY (EGD) WITH PROPOFOL;  Surgeon: Daneil Dolin, MD;  Location: [facility];  Service: Endoscopy;  Laterality: N/A;  . HERNIA REPAIR    . KNEE ARTHROSCOPY WITH MEDIAL MENISECTOMY Left 11/06/2012   Procedure: KNEE ARTHROSCOPY WITH MEDIAL MENISECTOMY;  Surgeon: Carole Civil, MD;  Location: [facility];  Service: Orthopedics;  Laterality: Left;  . Lipoma removal     Stomach  . LUMBAR LAMINECTOMY/DECOMPRESSION MICRODISCECTOMY Left 08/30/2013   Procedure: LUMBAR LAMINECTOMY/DECOMPRESSION MICRODISCECTOMY LEFT  LUMBAR TWO THREE;  Surgeon: Otilio Connors, MD;  Location: [facility];  Service: Neurosurgery;  Laterality: Left;  . SHOULDER SURGERY     Rght-rotator cuff    There were no vitals filed for this visit.      Subjective Assessment - 05/21/16 0950    Subjective Patient arrives today stating that he is still having trouble with some raw areas on his back end that are open; he is having this addressed by other providers. They do bother him after walking for awhile. Patient continues to state he wants to do what he can at PT. Nothing else major going on except he keeps losing weight (intentionally) and he is happy with this.     Pertinent History CHF, DM, HTN, S/P lumbar surgeryL2-3, lymphedema, COPD,    Currently  in Pain? Yes   Pain Score 8    Pain Location Other (Comment)  legs and rear end    Pain Orientation Right;Left   Pain Descriptors / Indicators Other (Comment)  skin on rear is just raw and tender; knees are just achey    Pain Type Chronic pain   Pain Radiating Towards none    Pain Onset More than a month ago   Pain Frequency Constant   Aggravating Factors  weight bearing, getting too hot    Pain Relieving Factors getting weight off of painful areas    Effect of Pain on Daily Activities limited mobility, pain with activity                           [facility] Adult PT Treatment/Exercise - 05/21/16 0001      Ambulation/Gait   Ambulation/Gait Yes   Ambulation/Gait Assistance 4: Min guard   Ambulation Distance (Feet) --  47f, 170f   Assistive device Rolling walker   Gait Pattern Step-through pattern;Right flexed knee in stance;Left flexed knee in stance;Decreased trunk rotation;Trunk flexed   Gait Comments limited by pain/fatigue today      Lumbar Exercises: Supine   Other Supine Lumbar Exercises supine starfish stretch x15-20 minutes during patient education                          PT Education - 05/21/16 1038    Education provided Yes   Education Details extensive education provided regarding pressure relief techniques and frequency at home (once every 10-15 minutes); educated patient regarding pressure relieving cushions, including ROHO, and advised him to speak to MD/nurse working with his buttock wounds regarding cushion recommendations; advised to go back to [employer]    Person(s) Educated Patient   Methods Explanation   Comprehension Verbalized understanding          PT Short Term Goals - 04/30/16 1021      PT SHORT TERM GOAL #1   Title Pt will have a  5 cm reduction in bilateral  for improved fitting of clothing    Baseline DC-ed from lymph PT    Time 3   Period Weeks   Status Achieved     PT SHORT TERM GOAL #2   Title Pt will be able to identify the signs and symptoms of cellulitis due to prolong lymphstasis   Baseline DC-ed from lymph PT    Time 1   Period Weeks   Status Achieved     PT SHORT TERM GOAL #3   Title Pt will have a 5 cm decrease at dorsum of foot to improve ability to don shoes    Baseline DC-ed from lymph PT    Time 3   Period Weeks   Status Deferred     PT SHORT TERM GOAL #4   Title Pt will be I in remedial exercises to promote lymph circulaiton    Baseline DC-ed from lymph PT    Time 2   Period Weeks    Status Achieved     PT SHORT TERM GOAL #5   Title Pt to be able to complete bed mobility independently    Baseline 8/1- improving but still needs work    Time 2   Period Weeks   Status On-going     PT SHORT TERM GOAL #6   Title Patient to be able to stand  for five minutes for improved transfers and decreased risk of falling    Baseline 8/1- improving    Time 3   Period Weeks   Status On-going     PT SHORT TERM GOAL #7   Title Patient to be able to verbally state 5/5 fall precautions in order to show good understanding of safety and fall prevention    Time 3   Period Weeks   Status New           PT Long Term Goals - 04/30/16 1021      PT LONG TERM GOAL #1   Title Pt will have a 7cm  reduction in fluid in bilateral LE  to promote independence of transfers. Pt to be able to transfer to bed and be able to lift his feet onto the bed on his own.    Baseline DC-ed from lymph PT    Time 6   Period Weeks   Status Achieved     PT LONG TERM GOAL #2   Title Pt to have acquired compression garment for bilateral  LE to allow maintance phase of treatment to begin    Baseline DC-ed from lymph PT    Time 6   Period Weeks   Status Achieved     PT LONG TERM GOAL #3   Title Pt to have no drainage from either LE to reduce risk of infection .   Baseline DC-ed from lymph PT    Time 3   Period Weeks   Status Achieved     PT LONG TERM GOAL #4   Title Pt life impact to decrease by at least 15 points to show improved physical/functional concerns    Time 6   Period Weeks   Status On-going     PT LONG TERM GOAL #5   Title Pt to be able to ambulate with walker for 25 feet to allow patient to walk in his apartment to allow patient to go into the restroom when his aide is not present.    Baseline 8/1- 73f today    Time 6   Period Weeks   Status Achieved     Additional Long Term Goals   Additional Long Term Goals Yes     PT LONG TERM GOAL #6   Title Patient to be able to  ambulate 533fwith personal rolling walker and minimal fatigue to further improve mobility and safety at home    Time 6   Period Weeks   Status New     PT LONG TERM GOAL #7   Title Patient to be able to stand statically for at least 6 minutes with U UE support and minimal fatigue in order to improve mobilty and self-care at home    Time 6   Period Weeks   Status New               Plan - 05/21/16 1040    Clinical Impression Statement Patient arrives fatigued today and reports he has not been to therapy much due to having problems with his buttock wounds not healing well; not feeling 100% today but he wants to try what he can. Spent majority of session in supine full body stretch (supine star-fish stretch) for posture and to address severely tight hip flexors. Able to remain in this position for approximately 20 minutes while extensive education was provided regarding pressure relief strategies/off-loading wound areas on buttocks (weight shifting, partial sit to stand, etc at least once every 10-15  minutes) as well as on various cushions (especially ROHO) that can be used for redirection of pressure/reducing load on wound areas while sitting. Advised patient to speak to MD/nurse who are treating wounds for further advice regarding possible cushion recommendations. Finished session with gait training today, limited by pain and fatigue today.    Rehab Potential Good   PT Frequency 2x / week   PT Duration 4 weeks   PT Treatment/Interventions ADLs/Self Care Home Management;Gait training;Functional mobility training;Therapeutic activities;Therapeutic exercise;Balance training;Neuromuscular re-education;Patient/family education;Manual techniques;Energy conservation   PT Next Visit Plan continue stretches on mat in supine, functional strengthening as able/tolerated. Gait and balance. Check on patient's progress in speaking to MD about possible new WC cushion.    PT Home Exercise Plan 8/22:  encouraged transfer to bed, begin stationary bike at home and increase frequency with LE HEP. Added weight shifting/offloading in WC once every 10-15 minutes.     Consulted and Agree with Plan of Care Patient      Patient will benefit from skilled therapeutic intervention in order to improve the following deficits and impairments:  Abnormal gait, Decreased balance, Decreased activity tolerance, Decreased range of motion, Decreased strength, Increased edema, Pain  Visit Diagnosis: Difficulty in walking, not elsewhere classified  Unsteadiness on feet  Muscle weakness (generalized)     Problem List Patient Active Problem List   Diagnosis Date Noted  . Diastolic dysfunction 41/28/7867  . Stage II pressure ulcer of sacral region 02/13/2015  . Atypical nevi 01/18/2015  . Intertriginous candidiasis 12/23/2014  . Hypothyroidism 11/30/2014  . Bradycardia 07/21/2014  . Sinus pause 07/21/2014  . Liver fibrosis ([place]) 06/29/2014  . IDA (iron deficiency anemia) 06/29/2014  . Iron deficiency anemia 05/31/2014  . Hepatomegaly 05/25/2014  . Difficulty walking 05/03/2014  . Knee pain, chronic 05/03/2014  . Stiffness of joint, not elsewhere classified, pelvic region and thigh 05/03/2014  . Dermatitis 04/12/2014  . HNP (herniated nucleus pulposus), lumbar 08/30/2013  . Spinal stenosis of lumbar region 07/06/2013  . Carpal tunnel syndrome 04/13/2013  . Lymphedema 04/13/2013  . Status post arthroscopy of left knee 04/13/2013  . DDD (degenerative disc disease), lumbosacral 04/13/2013  . Peripheral neuropathy ([place]) 01/05/2013  . S/P arthroscopy of left knee 11/09/2012  . Osteoarthritis of left knee 10/27/2012  . Meniscus tear 10/07/2012  . Patellar tendonitis 07/01/2012  . NASH (nonalcoholic steatohepatitis) 05/22/2007  . Type II diabetes mellitus with neurological manifestations ([place]) 05/20/2007  . Hyperlipidemia 10/15/2006  . OBESITY, MORBID 10/15/2006  . Essential hypertension 10/15/2006   . GERD 10/15/2006    Deniece Ree PT, DPT [facility] [address] [place], [place], [address] Phone: [phone]   Fax:  [phone]  Name: ALFONZO ARCA MRN: [id number] Date of Birth: 04-Mar-1946

## 2016-05-21 NOTE — Patient Outreach (Signed)
[facility]) Care Management  05/21/2016  KAYVEON LENNARTZ 06-Aug-1946 [id number]   Michael [name]'s niece called to report that Mr. Mazor continues to have problems with discomfort of the skin around his scrotum and gluteal folds. She also mentioned that he had questions about his upcoming labs.   Plan: I will see Mr. Custer at home on Thursday afternoon to assess his skin condition and perform venipuncture for labs if ordered.    [facility]  [phone]

## 2016-05-21 NOTE — Telephone Encounter (Signed)
Received call from patient.   Requested refill on Percocet.   Ok to refill??  Last office visit 03/25/2016.  Last refill 04/24/2016.

## 2016-05-21 NOTE — Telephone Encounter (Signed)
okay

## 2016-05-21 NOTE — Telephone Encounter (Signed)
Refill appropriate and filled per protocol. 

## 2016-05-22 MED ORDER — OXYCODONE-ACETAMINOPHEN 7.5-325 MG PO TABS: ORAL_TABLET | ORAL | 0 refills | Status: DC

## 2016-05-22 NOTE — Telephone Encounter (Signed)
Prescription printed and patient made aware to come to office to pick up after 2pm on 05/22/2016.

## 2016-05-23 ENCOUNTER — Ambulatory Visit (HOSPITAL_COMMUNITY): Payer: Medicare Other | Admitting: Physical Therapy

## 2016-05-23 ENCOUNTER — Other Ambulatory Visit: Payer: Self-pay | Admitting: Family Medicine

## 2016-05-23 DIAGNOSIS — R2681 Unsteadiness on feet: Secondary | ICD-10-CM | POA: Diagnosis not present

## 2016-05-23 DIAGNOSIS — M25561 Pain in right knee: Secondary | ICD-10-CM | POA: Diagnosis not present

## 2016-05-23 DIAGNOSIS — M6281 Muscle weakness (generalized): Secondary | ICD-10-CM | POA: Diagnosis not present

## 2016-05-23 DIAGNOSIS — E1149 Type 2 diabetes mellitus with other diabetic neurological complication: Secondary | ICD-10-CM | POA: Diagnosis not present

## 2016-05-23 DIAGNOSIS — R262 Difficulty in walking, not elsewhere classified: Secondary | ICD-10-CM | POA: Diagnosis not present

## 2016-05-23 DIAGNOSIS — M25562 Pain in left knee: Secondary | ICD-10-CM | POA: Diagnosis not present

## 2016-05-23 DIAGNOSIS — E871 Hypo-osmolality and hyponatremia: Secondary | ICD-10-CM | POA: Diagnosis not present

## 2016-05-23 DIAGNOSIS — E038 Other specified hypothyroidism: Secondary | ICD-10-CM | POA: Diagnosis not present

## 2016-05-23 LAB — BASIC METABOLIC PANEL
BUN: 34 mg/dL — AB (ref 7–25)
CHLORIDE: 97 mmol/L — AB (ref 98–110)
CO2: 29 mmol/L (ref 20–31)
Calcium: 9.7 mg/dL (ref 8.6–10.3)
Creat: 1.19 mg/dL (ref 0.70–1.25)
GLUCOSE: 98 mg/dL (ref 70–99)
POTASSIUM: 4.3 mmol/L (ref 3.5–5.3)
SODIUM: 137 mmol/L (ref 135–146)

## 2016-05-23 LAB — TSH: TSH: 2.79 m[IU]/L (ref 0.40–4.50)

## 2016-05-23 LAB — T4, FREE: Free T4: 1.6 ng/dL (ref 0.8–1.8)

## 2016-05-23 LAB — T3, FREE: T3, Free: 2.4 pg/mL (ref 2.3–4.2)

## 2016-05-23 NOTE — Therapy (Signed)
[place], [place], [address] Phone: [phone]   Fax:  [phone]  Physical Therapy Treatment  Patient Details  Name: Michael [name] MRN: [id number] Date of Birth: [birth year] Referring Provider: Dr. Vic Blackbird   Encounter Date: 05/23/2016      PT End of Session - 05/23/16 1238    Visit Number 22   Number of Visits 24   Date for PT Re-Evaluation 05/28/16   Authorization Type [employer] medicare (gcode on visit 59)   Authorization - Visit Number 22   Authorization - Number of Visits 28   PT Start Time 1884   PT Stop Time 1125   PT Time Calculation (min) 50 min   Activity Tolerance Patient tolerated treatment well;Patient limited by fatigue   Behavior During Therapy [facility] for tasks assessed/performed      Past Medical History:  Diagnosis Date  . Arthritis   . COPD (chronic obstructive pulmonary disease) ([place])   . DDD (degenerative disc disease), lumbosacral   . Depression   . Diabetic neuropathy ([place])   . Diastolic dysfunction   . Essential hypertension   . GERD (gastroesophageal reflux disease)   . Hyperlipidemia   . Iron deficiency anemia   . Left knee DJD   . Lumbar spinal stenosis   . NASH (nonalcoholic steatohepatitis)   . Peripheral edema   . Peripheral edema 10/05/2015  . Type 2 diabetes mellitus ([place])     Past Surgical History:  Procedure Laterality Date  . BIOPSY N/A 07/21/2014   Procedure: BIOPSY;  Surgeon: Daneil Dolin, MD;  Location: [facility];  Service: Endoscopy;  Laterality: N/A;  . COLONOSCOPY  2011   [place]: normal colon, normal distal ileum  . COLONOSCOPY WITH PROPOFOL N/A 07/21/2014   Procedure: ATTEMPTED COLONOSCOPY WITH PROPOFOL-HAD TO STOP DUE TO BRADYCARDIA;  Surgeon: Daneil Dolin, MD;  Location: [facility];  Service: Endoscopy;  Laterality: N/A;  . EGD with enteroscopy  2011   [facility]: normal esophagus and stomach. Normal duodenum, jejunum. No evidence of AVMs.   .  ESOPHAGOGASTRODUODENOSCOPY (EGD) WITH PROPOFOL N/A 07/21/2014   Procedure: ESOPHAGOGASTRODUODENOSCOPY (EGD) WITH PROPOFOL;  Surgeon: Daneil Dolin, MD;  Location: [facility];  Service: Endoscopy;  Laterality: N/A;  . HERNIA REPAIR    . KNEE ARTHROSCOPY WITH MEDIAL MENISECTOMY Left 11/06/2012   Procedure: KNEE ARTHROSCOPY WITH MEDIAL MENISECTOMY;  Surgeon: Carole Civil, MD;  Location: [facility];  Service: Orthopedics;  Laterality: Left;  . Lipoma removal     Stomach  . LUMBAR LAMINECTOMY/DECOMPRESSION MICRODISCECTOMY Left 08/30/2013   Procedure: LUMBAR LAMINECTOMY/DECOMPRESSION MICRODISCECTOMY LEFT  LUMBAR TWO THREE;  Surgeon: Otilio Connors, MD;  Location: [place] [facility];  Service: Neurosurgery;  Laterality: Left;  . SHOULDER SURGERY     Rght-rotator cuff    There were no vitals filed for this visit.      Subjective Assessment - 05/23/16 1037    Subjective Patient arrives today stating that he has to go for bloodwork right after PT today. He is still having trouble with the wounds on his rear end, they have tried to get a ROHO cushion but everyone is back-ordered so they bought a gel cushion for him to use. They don't know when the cushions will be in stock.     Pertinent History CHF, DM, HTN, S/P lumbar surgeryL2-3, lymphedema, COPD,    Currently in Pain? Yes   Pain Score 6    Pain Location Other (Comment)  legs and wounds on rear    Pain Orientation Right;Left                         [facility] Adult PT Treatment/Exercise - 05/23/16 0001      Therapeutic Activites    Therapeutic Activities Other Therapeutic Activities   Other Therapeutic Activities WC repair approximately 15 minutes      Lumbar Exercises: Supine   Ab Set 10 reps   AB Set Limitations 3 second holds    Glut Set 15 reps   Heel Slides 5 reps   Heel Slides Limitations 2 sets each   Bridge 10 reps   Other Supine Lumbar Exercises supine starfish stretch 5 minutesx2   Other Supine Lumbar Exercises SAQs 2x5;  hip ABD 2x5 no band; scapular retractions 1x10 supine                PT Education - 05/23/16 1237    Education provided Yes   Education Details continued to encourage patient to pursue ROHO cushion for pressure relief, reviewed pressure relief frequency   Person(s) Educated Patient   Methods Explanation   Comprehension Verbalized understanding          PT Short Term Goals - 04/30/16 1021      PT SHORT TERM GOAL #1   Title Pt will have a  5 cm reduction in bilateral  for improved fitting of clothing    Baseline DC-ed from lymph PT    Time 3   Period Weeks   Status Achieved     PT SHORT TERM GOAL #2   Title Pt will be able to identify the signs and symptoms of cellulitis due to prolong lymphstasis   Baseline DC-ed from lymph PT    Time 1   Period Weeks   Status Achieved     PT SHORT TERM GOAL #3   Title Pt will have a 5 cm decrease at dorsum of foot to improve ability to don shoes    Baseline DC-ed from lymph PT    Time 3   Period Weeks   Status Deferred     PT SHORT TERM GOAL #4   Title Pt will be I in remedial exercises to promote lymph circulaiton    Baseline DC-ed from lymph PT    Time 2   Period Weeks   Status Achieved     PT SHORT TERM GOAL #5   Title Pt to be able to complete bed mobility independently    Baseline 8/1- improving but still needs work    Time 2   Period Weeks   Status On-going     PT SHORT TERM GOAL #6   Title Patient to be able to stand for five minutes for improved transfers and decreased risk of falling    Baseline 8/1- improving    Time 3   Period Weeks   Status On-going     PT SHORT TERM GOAL #7   Title Patient to be able to verbally state 5/5 fall precautions in order to show good understanding of safety and fall prevention    Time 3   Period Weeks   Status New           PT Long Term Goals - 04/30/16 1021      PT LONG TERM GOAL #1   Title Pt will have a 7cm  reduction in fluid in bilateral LE  to promote  independence of transfers. Pt to  be able to transfer to bed and be able to lift his feet onto the bed on his own.    Baseline DC-ed from lymph PT    Time 6   Period Weeks   Status Achieved     PT LONG TERM GOAL #2   Title Pt to have acquired compression garment for bilateral  LE to allow maintance phase of treatment to begin    Baseline DC-ed from lymph PT    Time 6   Period Weeks   Status Achieved     PT LONG TERM GOAL #3   Title Pt to have no drainage from either LE to reduce risk of infection .   Baseline DC-ed from lymph PT    Time 3   Period Weeks   Status Achieved     PT LONG TERM GOAL #4   Title Pt life impact to decrease by at least 15 points to show improved physical/functional concerns    Time 6   Period Weeks   Status On-going     PT LONG TERM GOAL #5   Title Pt to be able to ambulate with walker for 25 feet to allow patient to walk in his apartment to allow patient to go into the restroom when his aide is not present.    Baseline 8/1- 7f today    Time 6   Period Weeks   Status Achieved     Additional Long Term Goals   Additional Long Term Goals Yes     PT LONG TERM GOAL #6   Title Patient to be able to ambulate 53fwith personal rolling walker and minimal fatigue to further improve mobility and safety at home    Time 6   Period Weeks   Status New     PT LONG TERM GOAL #7   Title Patient to be able to stand statically for at least 6 minutes with U UE support and minimal fatigue in order to improve mobilty and self-care at home    Time 6   Period Weeks   Status New               Plan - 05/23/16 1239    Clinical Impression Statement Continued to discuss ROHO cushion for pressure relief as well as review of pressure relieving frequency in WC today; otherwise continued with supine full body stretches and increased extent and intensity of full body exercises on mat table today as well. Near end of session OT patient offered to repair patient's loose,  unsafe WC brakes; patient gave verbal permission for this to occur. OT patient performed WC maintenance with direct supervision of PT/OT at end of session today with definite improvement in tightness of brakes/safety of chair noted afterwards.    Rehab Potential Good   PT Frequency 2x / week   PT Duration 4 weeks   PT Treatment/Interventions ADLs/Self Care Home Management;Gait training;Functional mobility training;Therapeutic activities;Therapeutic exercise;Balance training;Neuromuscular re-education;Patient/family education;Manual techniques;Energy conservation   PT Next Visit Plan Re-assess    PT Home Exercise Plan 8/22: encouraged transfer to bed, begin stationary bike at home and increase frequency with LE HEP. Added weight shifting/offloading in WC once every 10-15 minutes.     Consulted and Agree with Plan of Care Patient      Patient will benefit from skilled therapeutic intervention in order to improve the following deficits and impairments:  Abnormal gait, Decreased balance, Decreased activity tolerance, Decreased range of motion, Decreased strength, Increased edema, Pain  Visit Diagnosis:  Difficulty in walking, not elsewhere classified  Unsteadiness on feet  Muscle weakness (generalized)     Problem List Patient Active Problem List   Diagnosis Date Noted  . Diastolic dysfunction 23/36/1224  . Stage II pressure ulcer of sacral region 02/13/2015  . Atypical nevi 01/18/2015  . Intertriginous candidiasis 12/23/2014  . Hypothyroidism 11/30/2014  . Bradycardia 07/21/2014  . Sinus pause 07/21/2014  . Liver fibrosis ([place]) 06/29/2014  . IDA (iron deficiency anemia) 06/29/2014  . Iron deficiency anemia 05/31/2014  . Hepatomegaly 05/25/2014  . Difficulty walking 05/03/2014  . Knee pain, chronic 05/03/2014  . Stiffness of joint, not elsewhere classified, pelvic region and thigh 05/03/2014  . Dermatitis 04/12/2014  . HNP (herniated nucleus pulposus), lumbar 08/30/2013  . Spinal  stenosis of lumbar region 07/06/2013  . Carpal tunnel syndrome 04/13/2013  . Lymphedema 04/13/2013  . Status post arthroscopy of left knee 04/13/2013  . DDD (degenerative disc disease), lumbosacral 04/13/2013  . Peripheral neuropathy ([place]) 01/05/2013  . S/P arthroscopy of left knee 11/09/2012  . Osteoarthritis of left knee 10/27/2012  . Meniscus tear 10/07/2012  . Patellar tendonitis 07/01/2012  . NASH (nonalcoholic steatohepatitis) 05/22/2007  . Type II diabetes mellitus with neurological manifestations ([place]) 05/20/2007  . Hyperlipidemia 10/15/2006  . OBESITY, MORBID 10/15/2006  . Essential hypertension 10/15/2006  . GERD 10/15/2006    Deniece Ree PT, DPT [place] [address] [place], [place], [address] Phone: [phone]   Fax:  [phone]  Name: Michael [name] MRN: [id number] Date of Birth: 1946-06-24

## 2016-05-24 ENCOUNTER — Encounter: Payer: Self-pay | Admitting: Family Medicine

## 2016-05-25 ENCOUNTER — Other Ambulatory Visit: Payer: Self-pay | Admitting: Family Medicine

## 2016-05-27 ENCOUNTER — Ambulatory Visit: Payer: Self-pay | Admitting: *Deleted

## 2016-05-27 ENCOUNTER — Telehealth (HOSPITAL_COMMUNITY): Payer: Self-pay | Admitting: Physical Therapy

## 2016-05-27 NOTE — Telephone Encounter (Signed)
He will gp to [place] - to have consultaion with [place] to help get our services covered. NF 05/27/16

## 2016-05-28 ENCOUNTER — Ambulatory Visit (HOSPITAL_COMMUNITY): Payer: Medicare Other | Admitting: Physical Therapy

## 2016-05-29 ENCOUNTER — Other Ambulatory Visit: Payer: Self-pay | Admitting: *Deleted

## 2016-05-29 NOTE — Patient Outreach (Signed)
[facility]) Care Management  05/29/2016  Michael [name] [birth year] [id number]   I reached out to Michael [name] today to follow up on his skin condition. He tells me he is still having great difficulty with the skin on his scrotum and in the gluteal folds, stating it is burning and occasionally he notices bleeding.   Plan: I have scheduled a visit with Michael [name] tomorrow at home to assess his skin condition.    [place] Management  [phone]

## 2016-05-30 ENCOUNTER — Other Ambulatory Visit: Payer: Self-pay | Admitting: *Deleted

## 2016-05-30 ENCOUNTER — Ambulatory Visit (HOSPITAL_COMMUNITY): Payer: Medicare Other | Admitting: Physical Therapy

## 2016-05-30 ENCOUNTER — Encounter: Payer: Self-pay | Admitting: *Deleted

## 2016-05-30 DIAGNOSIS — M25561 Pain in right knee: Secondary | ICD-10-CM

## 2016-05-30 DIAGNOSIS — R262 Difficulty in walking, not elsewhere classified: Secondary | ICD-10-CM

## 2016-05-30 DIAGNOSIS — M6281 Muscle weakness (generalized): Secondary | ICD-10-CM | POA: Diagnosis not present

## 2016-05-30 DIAGNOSIS — R2681 Unsteadiness on feet: Secondary | ICD-10-CM

## 2016-05-30 DIAGNOSIS — M25562 Pain in left knee: Secondary | ICD-10-CM | POA: Diagnosis not present

## 2016-05-30 NOTE — Patient Outreach (Signed)
[name]) Care Management   05/30/2016  Michael [name] 02-19-1946 [id number]  Michael [name] is an 70 y.o. male with chronic diastolic heart failure with EF 60-65%, history of bradycardia, type II Diabetes, Hypertension, Iron Deficiency Anemia, NASH + Hepatomegaly, and degeneratove disc disease s/p surgery of L2-L3.   Michael [name] was admitted to the hospital in February with Acute/Chronic Diastolic Heart Failure and worsening lymphedema. Thereafter, he spent 21 days at [facility]. Michael [name] was referred to [place] Management for assistance with management of CHF.   [facility] Care Management has been following Michael [name] in the community since his discharge from SNF. Focus of care management services has been around CHF and DM Disease management with particular attention to volume management and peripheral edema/lymphedema and lower extremity skin ailments.   Michael [name] attended the lymphedema treatment program at [facility] PT department and he is very pleased with the improvement in his lower extremity swelling and skin condition. He is now participating in outpatient PT for strengthening,conditioning, and ambulation.   Michael [name] continues to have difficulty with skin problems related to immobility and long periods of sitting either in his living room chair or wheelchair.    Subjective: "I think I'm doing good all but this place on my bottom"  Objective:   Review of Systems  Constitutional: Negative.   HENT: Negative.   Eyes: Negative.   Respiratory: Negative.   Cardiovascular: Positive for leg swelling.       1-2+ edema bilateral lower extremities (mixed - patient also has lymphedema)  Gastrointestinal: Negative.   Genitourinary: Negative.   Musculoskeletal: Positive for myalgias. Negative for falls.  Skin:       2 areas of excoriation on skin of scrotum  Neurological: Negative.   Psychiatric/Behavioral: Negative.     Physical Exam  Constitutional:  He is oriented to person, place, and time. He appears well-developed and well-nourished.  Non-toxic appearance. He does not have a sickly appearance. He does not appear ill.  Cardiovascular: Normal rate and regular rhythm.   Respiratory: Effort normal and breath sounds normal. He has no wheezes. He has no rhonchi. He has no rales.  GI: Soft. Bowel sounds are normal.  Genitourinary:     Neurological: He is alert and oriented to person, place, and time. He has normal reflexes.  Skin: Skin is warm and dry.  Psychiatric: He has a normal mood and affect. His behavior is normal. Judgment and thought content normal.    Encounter Medications:   Outpatient Encounter Prescriptions as of 05/30/2016  Medication Sig  . ACCU-CHEK AVIVA PLUS test strip USE AS DIRECTED TWICE DAILY.  Marland Kitchen aspirin EC 81 MG tablet Take 81 mg by mouth daily.  . B-D ULTRAFINE III SHORT PEN 31G X 8 MM MISC USE AS DIRECTED DAILY WITH LANTUS.  Marland Kitchen benazepril (LOTENSIN) 40 MG tablet TAKE ONE TABLET BY MOUTH DAILY.  . bumetanide (BUMEX) 2 MG tablet Take 1.5 tablets (3 mg total) by mouth 2 (two) times daily.  Marland Kitchen BYSTOLIC 10 MG tablet TAKE ONE TABLET BY MOUTH DAILY.  . clobetasol cream (TEMOVATE) 0.05 % APPLY TO AFFECTED AREA 2 TIMES DAILY.  . diazepam (VALIUM) 5 MG tablet TAKE 1 TABLET BY MOUTH AT BEDTIME AS NEEDED FOR ANXIETY.  . diclofenac sodium (VOLTAREN) 1 % GEL Apply 4 g topically 4 (four) times daily.  . Ferrous Sulfate (IRON) 325 (65 FE) MG TABS Take 1 tablet by mouth daily.   Marland Kitchen gabapentin (NEURONTIN) 400 MG  capsule TAKE ONE CAPSULE BY MOUTH THREE TIMES DAILY.  Marland Kitchen LANTUS SOLOSTAR 100 UNIT/ML Solostar Pen INJECT 30 UNITS SUBCUTANEOUSLY AT BEDTIME.  Marland Kitchen levothyroxine (SYNTHROID, LEVOTHROID) 88 MCG tablet TAKE 1 TABLET BY MOUTH DAILY BEFORE BREAKFAST.  . Magnesium 400 MG TABS Take 400 mg by mouth 2 (two) times daily.  . meloxicam (MOBIC) 15 MG tablet TAKE ONE TABLET BY MOUTH DAILY WITH FOOD.  Marland Kitchen metFORMIN (GLUCOPHAGE) 1000 MG tablet  TAKE ONE TABLET BY MOUTH TWICE DAILY WITH A MEAL.  Marland Kitchen metolazone (ZAROXOLYN) 2.5 MG tablet Take 1 tablet (2.5 mg total) by mouth daily.  Marland Kitchen nystatin (MYCOSTATIN/NYSTOP) powder Apply topically 4 (four) times daily.  Marland Kitchen nystatin cream (MYCOSTATIN) Apply 1 application topically 2 (two) times daily.  Marland Kitchen omeprazole (PRILOSEC) 20 MG capsule TAKE ONE CAPSULE BY MOUTH TWICE DAILY.  Marland Kitchen oxyCODONE-acetaminophen (PERCOCET) 7.5-325 MG tablet Take one tablet by mouth every 6 hours as needed for severe pain. Max APAP 3gm/24hrs from all sources  . potassium chloride (K-DUR) 10 MEQ tablet Take 1 tablet (10 mEq total) by mouth 2 (two) times daily.  . pravastatin (PRAVACHOL) 40 MG tablet TAKE ONE TABLET BY MOUTH DAILY.    Assessment:  Brief visit with [name] today at his request to assess skin on scrotum as he is complaining of discomfort.   Skin Condition - Michael [name] has 2 small areas (1cm x 1cm) of excoriation on the skin of the posterior right scrotum. There are no signs of infection and the excoriated areas are not bleeding or draining. [name] says he is trying to keep the area dry and clean. He is using a hydrocolloid dressing to the sacral skin to prevent breakdown.   I advised that Michael [name] try to allow air to the perineal area as often as possible (especially after bath/shower) and to avoid sitting in his wheelchair on his bottom as often as possible. He is able to rest on his side with a pillow between his legs on his bed in his bedroom.   Peripheral Edema/Lymphedema - Michael [name] has 1-2+ edema of the bilateral lower extremities, particularly the pre-tibia area. The edema is firm, more consistent with lymphedema, but still quite mild for Michael [name]. I advised that he keep a close eye on his legs and call for new or worsening edema. He is still wearing his prescribed wraps and has no noted edema of the legs.   Note: Michael [name] has lost 45# overall since beginning treatment for lymphedema and  ongoing treatment of CHF.    Plan:   I will follow up with Mr. Antunes by phone in a few weeks.   Mr. Fodor will continue to attend outpatient PT as scheduled (he has 2 more appointments scheduled).   Mr. Nickolson will see Dr. Buelah Manis in October as scheduled.     [place] Management  [phone]

## 2016-05-30 NOTE — Therapy (Signed)
[place], [place], [address] Phone: [phone]   Fax:  [phone]  Physical Therapy Treatment (Discharge)  Patient Details  Name: Michael [name] MRN: [id number] Date of Birth: [birth year] Referring Provider: Dr. Vic Blackbird   Encounter Date: 05/30/2016      PT End of Session - 05/30/16 1242    Visit Number 23   Number of Visits 23   Authorization Type [employer] medicare (gcode on visit 23)   Authorization - Visit Number 23   Authorization - Number of Visits 33   PT Start Time 5009   PT Stop Time 1109   PT Time Calculation (min) 38 min   Activity Tolerance Patient tolerated treatment well;Patient limited by pain   Behavior During Therapy [facility] for tasks assessed/performed      Past Medical History:  Diagnosis Date  . Arthritis   . COPD (chronic obstructive pulmonary disease) ([place])   . DDD (degenerative disc disease), lumbosacral   . Depression   . Diabetic neuropathy ([place])   . Diastolic dysfunction   . Essential hypertension   . GERD (gastroesophageal reflux disease)   . Hyperlipidemia   . Iron deficiency anemia   . Left knee DJD   . Lumbar spinal stenosis   . NASH (nonalcoholic steatohepatitis)   . Peripheral edema   . Peripheral edema 10/05/2015  . Type 2 diabetes mellitus ([place])     Past Surgical History:  Procedure Laterality Date  . BIOPSY N/A 07/21/2014   Procedure: BIOPSY;  Surgeon: Daneil Dolin, MD;  Location: [facility];  Service: Endoscopy;  Laterality: N/A;  . COLONOSCOPY  2011   [place]: normal colon, normal distal ileum  . COLONOSCOPY WITH PROPOFOL N/A 07/21/2014   Procedure: ATTEMPTED COLONOSCOPY WITH PROPOFOL-HAD TO STOP DUE TO BRADYCARDIA;  Surgeon: Daneil Dolin, MD;  Location: [facility];  Service: Endoscopy;  Laterality: N/A;  . EGD with enteroscopy  2011   [facility]: normal esophagus and stomach. Normal duodenum, jejunum. No evidence of AVMs.   . ESOPHAGOGASTRODUODENOSCOPY (EGD) WITH  PROPOFOL N/A 07/21/2014   Procedure: ESOPHAGOGASTRODUODENOSCOPY (EGD) WITH PROPOFOL;  Surgeon: Daneil Dolin, MD;  Location: [facility];  Service: Endoscopy;  Laterality: N/A;  . HERNIA REPAIR    . KNEE ARTHROSCOPY WITH MEDIAL MENISECTOMY Left 11/06/2012   Procedure: KNEE ARTHROSCOPY WITH MEDIAL MENISECTOMY;  Surgeon: Carole Civil, MD;  Location: [facility];  Service: Orthopedics;  Laterality: Left;  . Lipoma removal     Stomach  . LUMBAR LAMINECTOMY/DECOMPRESSION MICRODISCECTOMY Left 08/30/2013   Procedure: LUMBAR LAMINECTOMY/DECOMPRESSION MICRODISCECTOMY LEFT  LUMBAR TWO THREE;  Surgeon: Otilio Connors, MD;  Location: [place] [facility];  Service: Neurosurgery;  Laterality: Left;  . SHOULDER SURGERY     Rght-rotator cuff    There were no vitals filed for this visit.      Subjective Assessment - 05/30/16 1037    Subjective Patient arrives today stating he has a new WC and that he is frustrated becuase it just doesn't fit him well, he has trouble steering it and it feels much worse on the wounds on his rear. He has been having to push himself backwards in his chair rather than forwards at home. No falls or close calls recently.  He statse he would be OK with today being his last day with therapy. He found out that the [place] will help him to [employer] moving forward but will not back-pay.    Pertinent History CHF, DM,  HTN, S/P lumbar surgeryL2-3, lymphedema, COPD,    How long can you sit comfortably? no problem    How long can you stand comfortably? 8/31- still around 2 minutes    How long can you walk comfortably? 8/31- household distances with someone guarding him with WC, walker    Patient Stated Goals improve mobility, get back to walking    Currently in Pain? Yes   Pain Score 10-Worst pain ever   Pain Location Knee   Pain Orientation Right;Left   Pain Descriptors / Indicators Sharp   Pain Type Chronic pain   Pain Radiating Towards none    Pain Onset More than a month ago   Pain  Frequency Constant   Aggravating Factors  weight bearing    Pain Relieving Factors getting weight off of painful areas    Effect of Pain on Daily Activities limited mobility, pain with activity             [facility] PT Assessment - 05/30/16 0001      Strength   Right Hip Flexion 2+/5   Right Hip ABduction 2/5  sitting    Left Hip Flexion 2+/5   Left Hip ABduction 2/5  sitting    Right Knee Extension 3/5  pain limited    Left Knee Extension 3/5  pain limited   Right Ankle Dorsiflexion 4/5   Left Ankle Dorsiflexion 4/5     Transfers   Sit to Stand 5: Supervision   Stand to Sit 5: Supervision   Stand Pivot Transfers 5: Supervision                     [facility] Adult PT Treatment/Exercise - 05/30/16 0001      Therapeutic Activites    Other Therapeutic Activities analysis of new WC/problems/analysis of pateint's functional ability to use chair and related barriers                   PT Education - 05/30/16 1242    Education provided Yes   Education Details DC today; adjustments that need to be made to [facility] for it to be safe/functional; progress with skilled PT services; review of need for ROHO cushion and pressure relief frequency    Person(s) Educated Patient   Methods Explanation   Comprehension Verbalized understanding          PT Short Term Goals - 04/30/16 1021      PT SHORT TERM GOAL #1   Title Pt will have a  5 cm reduction in bilateral  for improved fitting of clothing    Baseline DC-ed from lymph PT    Time 3   Period Weeks   Status Achieved     PT SHORT TERM GOAL #2   Title Pt will be able to identify the signs and symptoms of cellulitis due to prolong lymphstasis   Baseline DC-ed from lymph PT    Time 1   Period Weeks   Status Achieved     PT SHORT TERM GOAL #3   Title Pt will have a 5 cm decrease at dorsum of foot to improve ability to don shoes    Baseline DC-ed from lymph PT    Time 3   Period Weeks   Status Deferred     PT  SHORT TERM GOAL #4   Title Pt will be I in remedial exercises to promote lymph circulaiton    Baseline DC-ed from lymph PT    Time 2  Period Weeks   Status Achieved     PT SHORT TERM GOAL #5   Title Pt to be able to complete bed mobility independently    Baseline 8/1- improving but still needs work    Time 2   Period Weeks   Status On-going     PT SHORT TERM GOAL #6   Title Patient to be able to stand for five minutes for improved transfers and decreased risk of falling    Baseline 8/1- improving    Time 3   Period Weeks   Status On-going     PT SHORT TERM GOAL #7   Title Patient to be able to verbally state 5/5 fall precautions in order to show good understanding of safety and fall prevention    Time 3   Period Weeks   Status New           PT Long Term Goals - 04/30/16 1021      PT LONG TERM GOAL #1   Title Pt will have a 7cm  reduction in fluid in bilateral LE  to promote independence of transfers. Pt to be able to transfer to bed and be able to lift his feet onto the bed on his own.    Baseline DC-ed from lymph PT    Time 6   Period Weeks   Status Achieved     PT LONG TERM GOAL #2   Title Pt to have acquired compression garment for bilateral  LE to allow maintance phase of treatment to begin    Baseline DC-ed from lymph PT    Time 6   Period Weeks   Status Achieved     PT LONG TERM GOAL #3   Title Pt to have no drainage from either LE to reduce risk of infection .   Baseline DC-ed from lymph PT    Time 3   Period Weeks   Status Achieved     PT LONG TERM GOAL #4   Title Pt life impact to decrease by at least 15 points to show improved physical/functional concerns    Time 6   Period Weeks   Status On-going     PT LONG TERM GOAL #5   Title Pt to be able to ambulate with walker for 25 feet to allow patient to walk in his apartment to allow patient to go into the restroom when his aide is not present.    Baseline 8/1- 39f today    Time 6   Period Weeks    Status Achieved     Additional Long Term Goals   Additional Long Term Goals Yes     PT LONG TERM GOAL #6   Title Patient to be able to ambulate 541fwith personal rolling walker and minimal fatigue to further improve mobility and safety at home    Time 6   Period Weeks   Status New     PT LONG TERM GOAL #7   Title Patient to be able to stand statically for at least 6 minutes with U UE support and minimal fatigue in order to improve mobilty and self-care at home    Time 6   Period Weeks   Status New               Plan - 05/30/16 1243    Clinical Impression Statement Re-assessment performed today. Patient arrives stating that he would really like today to be his last day, also that he is concerned regarding his  new WC that he got this past week from a company in [place] and he has a very hard time using it/feels like he may be developing more wounds from this chair already. Performed functional wheelchair assessment today by testing chair in multiple conditions and identified the following concerns: 1) easy tipping of chair backwards, 2)  incorrect alignment on the R side of chair, 3) incorrect angle of seat, 4) inappropriate seat width, 5) inappropriate wheel size in front of chair to the point they are at risk of causing skin breakdown. Spent extensive time educating patient regarding WC deficiencies and recommended he return to [facility] vendor to correct these problems. Otherwise, based on strength/transfer assessment today and general review of gait/mobility of most recent treatment sessions, patient appears to have reached maximal benefit with skilled PT services at this time. DC to home recommendations at this time.    Rehab Potential Good   PT Treatment/Interventions ADLs/Self Care Home Management;Gait training;Functional mobility training;Therapeutic activities;Therapeutic exercise;Balance training;Neuromuscular re-education;Patient/family education;Manual techniques;Energy  conservation   PT Next Visit Plan DC today   Consulted and Agree with Plan of Care Patient      Patient will benefit from skilled therapeutic intervention in order to improve the following deficits and impairments:  Abnormal gait, Decreased balance, Decreased activity tolerance, Decreased range of motion, Decreased strength, Increased edema, Pain  Visit Diagnosis: Difficulty in walking, not elsewhere classified  Unsteadiness on feet  Muscle weakness (generalized)  Arthralgia of both lower legs       G-Codes - 11-Jun-2016 1257    Functional Assessment Tool Used skilled clinical assessment of gait, functional mobility, strength, gait    Functional Limitation Mobility: Walking and moving around   Mobility: Walking and Moving Around Goal Status [phone]) At least 60 percent but less than 80 percent impaired, limited or restricted   Mobility: Walking and Moving Around Discharge Status [phone]) At least 60 percent but less than 80 percent impaired, limited or restricted      Problem List Patient Active Problem List   Diagnosis Date Noted  . Diastolic dysfunction 82/95/6213  . Stage II pressure ulcer of sacral region 02/13/2015  . Atypical nevi 01/18/2015  . Intertriginous candidiasis 12/23/2014  . Hypothyroidism 11/30/2014  . Bradycardia 07/21/2014  . Sinus pause 07/21/2014  . Liver fibrosis ([place]) 06/29/2014  . IDA (iron deficiency anemia) 06/29/2014  . Iron deficiency anemia 05/31/2014  . Hepatomegaly 05/25/2014  . Difficulty walking 05/03/2014  . Knee pain, chronic 05/03/2014  . Stiffness of joint, not elsewhere classified, pelvic region and thigh 05/03/2014  . Dermatitis 04/12/2014  . HNP (herniated nucleus pulposus), lumbar 08/30/2013  . Spinal stenosis of lumbar region 07/06/2013  . Carpal tunnel syndrome 04/13/2013  . Lymphedema 04/13/2013  . Status post arthroscopy of left knee 04/13/2013  . DDD (degenerative disc disease), lumbosacral 04/13/2013  . Peripheral  neuropathy ([place]) 01/05/2013  . S/P arthroscopy of left knee 11/09/2012  . Osteoarthritis of left knee 10/27/2012  . Meniscus tear 10/07/2012  . Patellar tendonitis 07/01/2012  . NASH (nonalcoholic steatohepatitis) 05/22/2007  . Type II diabetes mellitus with neurological manifestations ([place]) 05/20/2007  . Hyperlipidemia 10/15/2006  . OBESITY, MORBID 10/15/2006  . Essential hypertension 10/15/2006  . GERD 10/15/2006    PHYSICAL THERAPY DISCHARGE SUMMARY  Visits from Start of Care: 23  Current functional level related to goals / functional outcomes: Patient first received and was successful in lymphedema therapy and from there progressed to strength/gait training with skilled PT services; while he has showed ability to  transfer and walk short, household distances on his own, patient is quite limited in his own home as he is not active due to fear of falling. We have reviewed extensive education with him regarding pressure relief strategies to use as well, and have also recommended ROHO cushion as well as adjustments to new WC for functional use/safety. DC today due to stall in/lack of progress.    Remaining deficits: Reduced functional activity tolerance, functional weakness, unsteadiness, impaired functional mobility, fall risk    Education / Equipment: DPT recommended adjustments to new WC for safety and function; reviewed pressure relief strategies and frequency; DC today  Plan: Patient agrees to discharge.  Patient goals were partially met. Patient is being discharged due to lack of progress.  ?????      Deniece Ree PT, DPT [place] [address] [place], [place], [address] Phone: [phone]   Fax:  [phone]  Name: Michael [name] MRN: [id number] Date of Birth: [birth year]

## 2016-06-02 DIAGNOSIS — I890 Lymphedema, not elsewhere classified: Secondary | ICD-10-CM | POA: Diagnosis not present

## 2016-06-02 DIAGNOSIS — J449 Chronic obstructive pulmonary disease, unspecified: Secondary | ICD-10-CM | POA: Diagnosis not present

## 2016-06-02 DIAGNOSIS — M1711 Unilateral primary osteoarthritis, right knee: Secondary | ICD-10-CM | POA: Diagnosis not present

## 2016-06-02 DIAGNOSIS — L89312 Pressure ulcer of right buttock, stage 2: Secondary | ICD-10-CM | POA: Diagnosis not present

## 2016-06-02 DIAGNOSIS — Z7984 Long term (current) use of oral hypoglycemic drugs: Secondary | ICD-10-CM | POA: Diagnosis not present

## 2016-06-02 DIAGNOSIS — Z7982 Long term (current) use of aspirin: Secondary | ICD-10-CM | POA: Diagnosis not present

## 2016-06-02 DIAGNOSIS — K219 Gastro-esophageal reflux disease without esophagitis: Secondary | ICD-10-CM | POA: Diagnosis not present

## 2016-06-02 DIAGNOSIS — E039 Hypothyroidism, unspecified: Secondary | ICD-10-CM | POA: Diagnosis not present

## 2016-06-02 DIAGNOSIS — E1140 Type 2 diabetes mellitus with diabetic neuropathy, unspecified: Secondary | ICD-10-CM | POA: Diagnosis not present

## 2016-06-02 DIAGNOSIS — Z794 Long term (current) use of insulin: Secondary | ICD-10-CM | POA: Diagnosis not present

## 2016-06-02 DIAGNOSIS — E785 Hyperlipidemia, unspecified: Secondary | ICD-10-CM | POA: Diagnosis not present

## 2016-06-02 DIAGNOSIS — I5040 Unspecified combined systolic (congestive) and diastolic (congestive) heart failure: Secondary | ICD-10-CM | POA: Diagnosis not present

## 2016-06-02 DIAGNOSIS — L89323 Pressure ulcer of left buttock, stage 3: Secondary | ICD-10-CM | POA: Diagnosis not present

## 2016-06-02 DIAGNOSIS — M1712 Unilateral primary osteoarthritis, left knee: Secondary | ICD-10-CM | POA: Diagnosis not present

## 2016-06-03 DIAGNOSIS — J449 Chronic obstructive pulmonary disease, unspecified: Secondary | ICD-10-CM | POA: Diagnosis not present

## 2016-06-03 DIAGNOSIS — M1712 Unilateral primary osteoarthritis, left knee: Secondary | ICD-10-CM | POA: Diagnosis not present

## 2016-06-03 DIAGNOSIS — I5040 Unspecified combined systolic (congestive) and diastolic (congestive) heart failure: Secondary | ICD-10-CM | POA: Diagnosis not present

## 2016-06-03 DIAGNOSIS — Z7982 Long term (current) use of aspirin: Secondary | ICD-10-CM | POA: Diagnosis not present

## 2016-06-03 DIAGNOSIS — Z7984 Long term (current) use of oral hypoglycemic drugs: Secondary | ICD-10-CM | POA: Diagnosis not present

## 2016-06-03 DIAGNOSIS — E785 Hyperlipidemia, unspecified: Secondary | ICD-10-CM | POA: Diagnosis not present

## 2016-06-03 DIAGNOSIS — K219 Gastro-esophageal reflux disease without esophagitis: Secondary | ICD-10-CM | POA: Diagnosis not present

## 2016-06-03 DIAGNOSIS — Z794 Long term (current) use of insulin: Secondary | ICD-10-CM | POA: Diagnosis not present

## 2016-06-03 DIAGNOSIS — E1140 Type 2 diabetes mellitus with diabetic neuropathy, unspecified: Secondary | ICD-10-CM | POA: Diagnosis not present

## 2016-06-03 DIAGNOSIS — E039 Hypothyroidism, unspecified: Secondary | ICD-10-CM | POA: Diagnosis not present

## 2016-06-03 DIAGNOSIS — L89312 Pressure ulcer of right buttock, stage 2: Secondary | ICD-10-CM | POA: Diagnosis not present

## 2016-06-03 DIAGNOSIS — L89323 Pressure ulcer of left buttock, stage 3: Secondary | ICD-10-CM | POA: Diagnosis not present

## 2016-06-03 DIAGNOSIS — I890 Lymphedema, not elsewhere classified: Secondary | ICD-10-CM | POA: Diagnosis not present

## 2016-06-03 DIAGNOSIS — M1711 Unilateral primary osteoarthritis, right knee: Secondary | ICD-10-CM | POA: Diagnosis not present

## 2016-06-04 ENCOUNTER — Ambulatory Visit (HOSPITAL_COMMUNITY): Payer: Medicare Other

## 2016-06-04 DIAGNOSIS — Z794 Long term (current) use of insulin: Secondary | ICD-10-CM | POA: Diagnosis not present

## 2016-06-04 DIAGNOSIS — I5040 Unspecified combined systolic (congestive) and diastolic (congestive) heart failure: Secondary | ICD-10-CM | POA: Diagnosis not present

## 2016-06-05 DIAGNOSIS — E785 Hyperlipidemia, unspecified: Secondary | ICD-10-CM | POA: Diagnosis not present

## 2016-06-05 DIAGNOSIS — Z7984 Long term (current) use of oral hypoglycemic drugs: Secondary | ICD-10-CM | POA: Diagnosis not present

## 2016-06-05 DIAGNOSIS — Z794 Long term (current) use of insulin: Secondary | ICD-10-CM | POA: Diagnosis not present

## 2016-06-05 DIAGNOSIS — K219 Gastro-esophageal reflux disease without esophagitis: Secondary | ICD-10-CM | POA: Diagnosis not present

## 2016-06-05 DIAGNOSIS — L89312 Pressure ulcer of right buttock, stage 2: Secondary | ICD-10-CM | POA: Diagnosis not present

## 2016-06-05 DIAGNOSIS — M1712 Unilateral primary osteoarthritis, left knee: Secondary | ICD-10-CM | POA: Diagnosis not present

## 2016-06-05 DIAGNOSIS — M1711 Unilateral primary osteoarthritis, right knee: Secondary | ICD-10-CM | POA: Diagnosis not present

## 2016-06-05 DIAGNOSIS — Z7982 Long term (current) use of aspirin: Secondary | ICD-10-CM | POA: Diagnosis not present

## 2016-06-05 DIAGNOSIS — I5040 Unspecified combined systolic (congestive) and diastolic (congestive) heart failure: Secondary | ICD-10-CM | POA: Diagnosis not present

## 2016-06-05 DIAGNOSIS — I890 Lymphedema, not elsewhere classified: Secondary | ICD-10-CM | POA: Diagnosis not present

## 2016-06-05 DIAGNOSIS — L89323 Pressure ulcer of left buttock, stage 3: Secondary | ICD-10-CM | POA: Diagnosis not present

## 2016-06-05 DIAGNOSIS — J449 Chronic obstructive pulmonary disease, unspecified: Secondary | ICD-10-CM | POA: Diagnosis not present

## 2016-06-05 DIAGNOSIS — E1140 Type 2 diabetes mellitus with diabetic neuropathy, unspecified: Secondary | ICD-10-CM | POA: Diagnosis not present

## 2016-06-05 DIAGNOSIS — E039 Hypothyroidism, unspecified: Secondary | ICD-10-CM | POA: Diagnosis not present

## 2016-06-06 ENCOUNTER — Encounter (HOSPITAL_COMMUNITY): Payer: Medicare Other | Admitting: Physical Therapy

## 2016-06-08 ENCOUNTER — Other Ambulatory Visit: Payer: Self-pay | Admitting: Family Medicine

## 2016-06-10 DIAGNOSIS — J449 Chronic obstructive pulmonary disease, unspecified: Secondary | ICD-10-CM | POA: Diagnosis not present

## 2016-06-10 DIAGNOSIS — E1140 Type 2 diabetes mellitus with diabetic neuropathy, unspecified: Secondary | ICD-10-CM | POA: Diagnosis not present

## 2016-06-10 DIAGNOSIS — L89323 Pressure ulcer of left buttock, stage 3: Secondary | ICD-10-CM | POA: Diagnosis not present

## 2016-06-10 DIAGNOSIS — M1712 Unilateral primary osteoarthritis, left knee: Secondary | ICD-10-CM | POA: Diagnosis not present

## 2016-06-10 DIAGNOSIS — M1711 Unilateral primary osteoarthritis, right knee: Secondary | ICD-10-CM | POA: Diagnosis not present

## 2016-06-10 DIAGNOSIS — I890 Lymphedema, not elsewhere classified: Secondary | ICD-10-CM | POA: Diagnosis not present

## 2016-06-10 DIAGNOSIS — L89312 Pressure ulcer of right buttock, stage 2: Secondary | ICD-10-CM | POA: Diagnosis not present

## 2016-06-10 DIAGNOSIS — Z7982 Long term (current) use of aspirin: Secondary | ICD-10-CM | POA: Diagnosis not present

## 2016-06-10 DIAGNOSIS — I5040 Unspecified combined systolic (congestive) and diastolic (congestive) heart failure: Secondary | ICD-10-CM | POA: Diagnosis not present

## 2016-06-10 DIAGNOSIS — Z7984 Long term (current) use of oral hypoglycemic drugs: Secondary | ICD-10-CM | POA: Diagnosis not present

## 2016-06-10 DIAGNOSIS — E785 Hyperlipidemia, unspecified: Secondary | ICD-10-CM | POA: Diagnosis not present

## 2016-06-10 DIAGNOSIS — Z794 Long term (current) use of insulin: Secondary | ICD-10-CM | POA: Diagnosis not present

## 2016-06-10 DIAGNOSIS — K219 Gastro-esophageal reflux disease without esophagitis: Secondary | ICD-10-CM | POA: Diagnosis not present

## 2016-06-10 DIAGNOSIS — E039 Hypothyroidism, unspecified: Secondary | ICD-10-CM | POA: Diagnosis not present

## 2016-06-10 NOTE — Telephone Encounter (Signed)
okay

## 2016-06-10 NOTE — Telephone Encounter (Signed)
Ok to refill Diazepam??  Last office visit 03/25/2016.  Last refill 03/11/2016, #2 refills.

## 2016-06-11 DIAGNOSIS — E1140 Type 2 diabetes mellitus with diabetic neuropathy, unspecified: Secondary | ICD-10-CM | POA: Diagnosis not present

## 2016-06-11 DIAGNOSIS — L89312 Pressure ulcer of right buttock, stage 2: Secondary | ICD-10-CM | POA: Diagnosis not present

## 2016-06-11 DIAGNOSIS — Z7982 Long term (current) use of aspirin: Secondary | ICD-10-CM | POA: Diagnosis not present

## 2016-06-11 DIAGNOSIS — E785 Hyperlipidemia, unspecified: Secondary | ICD-10-CM | POA: Diagnosis not present

## 2016-06-11 DIAGNOSIS — I890 Lymphedema, not elsewhere classified: Secondary | ICD-10-CM | POA: Diagnosis not present

## 2016-06-11 DIAGNOSIS — I5040 Unspecified combined systolic (congestive) and diastolic (congestive) heart failure: Secondary | ICD-10-CM | POA: Diagnosis not present

## 2016-06-11 DIAGNOSIS — Z7984 Long term (current) use of oral hypoglycemic drugs: Secondary | ICD-10-CM | POA: Diagnosis not present

## 2016-06-11 DIAGNOSIS — J449 Chronic obstructive pulmonary disease, unspecified: Secondary | ICD-10-CM | POA: Diagnosis not present

## 2016-06-11 DIAGNOSIS — M1712 Unilateral primary osteoarthritis, left knee: Secondary | ICD-10-CM | POA: Diagnosis not present

## 2016-06-11 DIAGNOSIS — K219 Gastro-esophageal reflux disease without esophagitis: Secondary | ICD-10-CM | POA: Diagnosis not present

## 2016-06-11 DIAGNOSIS — M1711 Unilateral primary osteoarthritis, right knee: Secondary | ICD-10-CM | POA: Diagnosis not present

## 2016-06-11 DIAGNOSIS — E039 Hypothyroidism, unspecified: Secondary | ICD-10-CM | POA: Diagnosis not present

## 2016-06-11 DIAGNOSIS — L89323 Pressure ulcer of left buttock, stage 3: Secondary | ICD-10-CM | POA: Diagnosis not present

## 2016-06-11 DIAGNOSIS — Z794 Long term (current) use of insulin: Secondary | ICD-10-CM | POA: Diagnosis not present

## 2016-06-11 NOTE — Telephone Encounter (Signed)
Medication refilled per protocol. 

## 2016-06-12 DIAGNOSIS — J449 Chronic obstructive pulmonary disease, unspecified: Secondary | ICD-10-CM | POA: Diagnosis not present

## 2016-06-12 DIAGNOSIS — I5040 Unspecified combined systolic (congestive) and diastolic (congestive) heart failure: Secondary | ICD-10-CM | POA: Diagnosis not present

## 2016-06-12 DIAGNOSIS — Z7984 Long term (current) use of oral hypoglycemic drugs: Secondary | ICD-10-CM | POA: Diagnosis not present

## 2016-06-12 DIAGNOSIS — E1140 Type 2 diabetes mellitus with diabetic neuropathy, unspecified: Secondary | ICD-10-CM | POA: Diagnosis not present

## 2016-06-12 DIAGNOSIS — M1711 Unilateral primary osteoarthritis, right knee: Secondary | ICD-10-CM | POA: Diagnosis not present

## 2016-06-12 DIAGNOSIS — L89312 Pressure ulcer of right buttock, stage 2: Secondary | ICD-10-CM | POA: Diagnosis not present

## 2016-06-12 DIAGNOSIS — E039 Hypothyroidism, unspecified: Secondary | ICD-10-CM | POA: Diagnosis not present

## 2016-06-12 DIAGNOSIS — M1712 Unilateral primary osteoarthritis, left knee: Secondary | ICD-10-CM | POA: Diagnosis not present

## 2016-06-12 DIAGNOSIS — Z794 Long term (current) use of insulin: Secondary | ICD-10-CM | POA: Diagnosis not present

## 2016-06-12 DIAGNOSIS — K219 Gastro-esophageal reflux disease without esophagitis: Secondary | ICD-10-CM | POA: Diagnosis not present

## 2016-06-12 DIAGNOSIS — I890 Lymphedema, not elsewhere classified: Secondary | ICD-10-CM | POA: Diagnosis not present

## 2016-06-12 DIAGNOSIS — L89323 Pressure ulcer of left buttock, stage 3: Secondary | ICD-10-CM | POA: Diagnosis not present

## 2016-06-12 DIAGNOSIS — Z7982 Long term (current) use of aspirin: Secondary | ICD-10-CM | POA: Diagnosis not present

## 2016-06-12 DIAGNOSIS — E785 Hyperlipidemia, unspecified: Secondary | ICD-10-CM | POA: Diagnosis not present

## 2016-06-13 DIAGNOSIS — I890 Lymphedema, not elsewhere classified: Secondary | ICD-10-CM | POA: Diagnosis not present

## 2016-06-13 DIAGNOSIS — E039 Hypothyroidism, unspecified: Secondary | ICD-10-CM | POA: Diagnosis not present

## 2016-06-13 DIAGNOSIS — M1712 Unilateral primary osteoarthritis, left knee: Secondary | ICD-10-CM | POA: Diagnosis not present

## 2016-06-13 DIAGNOSIS — J449 Chronic obstructive pulmonary disease, unspecified: Secondary | ICD-10-CM | POA: Diagnosis not present

## 2016-06-13 DIAGNOSIS — E1140 Type 2 diabetes mellitus with diabetic neuropathy, unspecified: Secondary | ICD-10-CM | POA: Diagnosis not present

## 2016-06-13 DIAGNOSIS — K219 Gastro-esophageal reflux disease without esophagitis: Secondary | ICD-10-CM | POA: Diagnosis not present

## 2016-06-13 DIAGNOSIS — Z7982 Long term (current) use of aspirin: Secondary | ICD-10-CM | POA: Diagnosis not present

## 2016-06-13 DIAGNOSIS — Z7984 Long term (current) use of oral hypoglycemic drugs: Secondary | ICD-10-CM | POA: Diagnosis not present

## 2016-06-13 DIAGNOSIS — L89323 Pressure ulcer of left buttock, stage 3: Secondary | ICD-10-CM | POA: Diagnosis not present

## 2016-06-13 DIAGNOSIS — E785 Hyperlipidemia, unspecified: Secondary | ICD-10-CM | POA: Diagnosis not present

## 2016-06-13 DIAGNOSIS — I5040 Unspecified combined systolic (congestive) and diastolic (congestive) heart failure: Secondary | ICD-10-CM | POA: Diagnosis not present

## 2016-06-13 DIAGNOSIS — Z794 Long term (current) use of insulin: Secondary | ICD-10-CM | POA: Diagnosis not present

## 2016-06-13 DIAGNOSIS — M1711 Unilateral primary osteoarthritis, right knee: Secondary | ICD-10-CM | POA: Diagnosis not present

## 2016-06-13 DIAGNOSIS — L89312 Pressure ulcer of right buttock, stage 2: Secondary | ICD-10-CM | POA: Diagnosis not present

## 2016-06-14 DIAGNOSIS — L89312 Pressure ulcer of right buttock, stage 2: Secondary | ICD-10-CM | POA: Diagnosis not present

## 2016-06-14 DIAGNOSIS — M1711 Unilateral primary osteoarthritis, right knee: Secondary | ICD-10-CM | POA: Diagnosis not present

## 2016-06-14 DIAGNOSIS — E039 Hypothyroidism, unspecified: Secondary | ICD-10-CM | POA: Diagnosis not present

## 2016-06-14 DIAGNOSIS — E785 Hyperlipidemia, unspecified: Secondary | ICD-10-CM | POA: Diagnosis not present

## 2016-06-14 DIAGNOSIS — E1140 Type 2 diabetes mellitus with diabetic neuropathy, unspecified: Secondary | ICD-10-CM | POA: Diagnosis not present

## 2016-06-14 DIAGNOSIS — M1712 Unilateral primary osteoarthritis, left knee: Secondary | ICD-10-CM | POA: Diagnosis not present

## 2016-06-14 DIAGNOSIS — I890 Lymphedema, not elsewhere classified: Secondary | ICD-10-CM | POA: Diagnosis not present

## 2016-06-14 DIAGNOSIS — J449 Chronic obstructive pulmonary disease, unspecified: Secondary | ICD-10-CM | POA: Diagnosis not present

## 2016-06-14 DIAGNOSIS — L89323 Pressure ulcer of left buttock, stage 3: Secondary | ICD-10-CM | POA: Diagnosis not present

## 2016-06-14 DIAGNOSIS — I5040 Unspecified combined systolic (congestive) and diastolic (congestive) heart failure: Secondary | ICD-10-CM | POA: Diagnosis not present

## 2016-06-14 DIAGNOSIS — K219 Gastro-esophageal reflux disease without esophagitis: Secondary | ICD-10-CM | POA: Diagnosis not present

## 2016-06-14 DIAGNOSIS — Z7982 Long term (current) use of aspirin: Secondary | ICD-10-CM | POA: Diagnosis not present

## 2016-06-14 DIAGNOSIS — Z794 Long term (current) use of insulin: Secondary | ICD-10-CM | POA: Diagnosis not present

## 2016-06-14 DIAGNOSIS — Z7984 Long term (current) use of oral hypoglycemic drugs: Secondary | ICD-10-CM | POA: Diagnosis not present

## 2016-06-15 ENCOUNTER — Other Ambulatory Visit: Payer: Self-pay | Admitting: Cardiology

## 2016-06-15 ENCOUNTER — Other Ambulatory Visit: Payer: Self-pay | Admitting: Family Medicine

## 2016-06-18 DIAGNOSIS — L89323 Pressure ulcer of left buttock, stage 3: Secondary | ICD-10-CM | POA: Diagnosis not present

## 2016-06-18 DIAGNOSIS — J449 Chronic obstructive pulmonary disease, unspecified: Secondary | ICD-10-CM | POA: Diagnosis not present

## 2016-06-18 DIAGNOSIS — E1140 Type 2 diabetes mellitus with diabetic neuropathy, unspecified: Secondary | ICD-10-CM | POA: Diagnosis not present

## 2016-06-18 DIAGNOSIS — M1711 Unilateral primary osteoarthritis, right knee: Secondary | ICD-10-CM | POA: Diagnosis not present

## 2016-06-18 DIAGNOSIS — K219 Gastro-esophageal reflux disease without esophagitis: Secondary | ICD-10-CM | POA: Diagnosis not present

## 2016-06-18 DIAGNOSIS — Z794 Long term (current) use of insulin: Secondary | ICD-10-CM | POA: Diagnosis not present

## 2016-06-18 DIAGNOSIS — I5040 Unspecified combined systolic (congestive) and diastolic (congestive) heart failure: Secondary | ICD-10-CM | POA: Diagnosis not present

## 2016-06-18 DIAGNOSIS — Z7982 Long term (current) use of aspirin: Secondary | ICD-10-CM | POA: Diagnosis not present

## 2016-06-18 DIAGNOSIS — M1712 Unilateral primary osteoarthritis, left knee: Secondary | ICD-10-CM | POA: Diagnosis not present

## 2016-06-18 DIAGNOSIS — Z7984 Long term (current) use of oral hypoglycemic drugs: Secondary | ICD-10-CM | POA: Diagnosis not present

## 2016-06-18 DIAGNOSIS — E785 Hyperlipidemia, unspecified: Secondary | ICD-10-CM | POA: Diagnosis not present

## 2016-06-18 DIAGNOSIS — L89312 Pressure ulcer of right buttock, stage 2: Secondary | ICD-10-CM | POA: Diagnosis not present

## 2016-06-18 DIAGNOSIS — E039 Hypothyroidism, unspecified: Secondary | ICD-10-CM | POA: Diagnosis not present

## 2016-06-18 DIAGNOSIS — I890 Lymphedema, not elsewhere classified: Secondary | ICD-10-CM | POA: Diagnosis not present

## 2016-06-19 ENCOUNTER — Encounter: Payer: Self-pay | Admitting: *Deleted

## 2016-06-19 ENCOUNTER — Telehealth: Payer: Self-pay | Admitting: *Deleted

## 2016-06-19 DIAGNOSIS — M1711 Unilateral primary osteoarthritis, right knee: Secondary | ICD-10-CM | POA: Diagnosis not present

## 2016-06-19 DIAGNOSIS — L89323 Pressure ulcer of left buttock, stage 3: Secondary | ICD-10-CM | POA: Diagnosis not present

## 2016-06-19 DIAGNOSIS — Z7982 Long term (current) use of aspirin: Secondary | ICD-10-CM | POA: Diagnosis not present

## 2016-06-19 DIAGNOSIS — I890 Lymphedema, not elsewhere classified: Secondary | ICD-10-CM | POA: Diagnosis not present

## 2016-06-19 DIAGNOSIS — I5040 Unspecified combined systolic (congestive) and diastolic (congestive) heart failure: Secondary | ICD-10-CM | POA: Diagnosis not present

## 2016-06-19 DIAGNOSIS — M1712 Unilateral primary osteoarthritis, left knee: Secondary | ICD-10-CM | POA: Diagnosis not present

## 2016-06-19 DIAGNOSIS — L89312 Pressure ulcer of right buttock, stage 2: Secondary | ICD-10-CM | POA: Diagnosis not present

## 2016-06-19 DIAGNOSIS — J449 Chronic obstructive pulmonary disease, unspecified: Secondary | ICD-10-CM | POA: Diagnosis not present

## 2016-06-19 DIAGNOSIS — Z794 Long term (current) use of insulin: Secondary | ICD-10-CM | POA: Diagnosis not present

## 2016-06-19 DIAGNOSIS — E039 Hypothyroidism, unspecified: Secondary | ICD-10-CM | POA: Diagnosis not present

## 2016-06-19 DIAGNOSIS — Z7984 Long term (current) use of oral hypoglycemic drugs: Secondary | ICD-10-CM | POA: Diagnosis not present

## 2016-06-19 DIAGNOSIS — K219 Gastro-esophageal reflux disease without esophagitis: Secondary | ICD-10-CM | POA: Diagnosis not present

## 2016-06-19 DIAGNOSIS — E1140 Type 2 diabetes mellitus with diabetic neuropathy, unspecified: Secondary | ICD-10-CM | POA: Diagnosis not present

## 2016-06-19 DIAGNOSIS — E785 Hyperlipidemia, unspecified: Secondary | ICD-10-CM | POA: Diagnosis not present

## 2016-06-19 MED ORDER — OXYCODONE-ACETAMINOPHEN 7.5-325 MG PO TABS: ORAL_TABLET | ORAL | 0 refills | Status: DC

## 2016-06-19 NOTE — Telephone Encounter (Signed)
okay

## 2016-06-19 NOTE — Telephone Encounter (Signed)
Prescription printed and patient made aware to come to office to pick up after 4pm on 06/19/2016 per VM.

## 2016-06-19 NOTE — Telephone Encounter (Signed)
Received call from patient.   Requested refill on Percocet.   Ok to refill??  Last office visit 03/25/2016.  Last refill 05/22/2016.

## 2016-06-20 DIAGNOSIS — J449 Chronic obstructive pulmonary disease, unspecified: Secondary | ICD-10-CM | POA: Diagnosis not present

## 2016-06-20 DIAGNOSIS — I5040 Unspecified combined systolic (congestive) and diastolic (congestive) heart failure: Secondary | ICD-10-CM | POA: Diagnosis not present

## 2016-06-20 DIAGNOSIS — K219 Gastro-esophageal reflux disease without esophagitis: Secondary | ICD-10-CM | POA: Diagnosis not present

## 2016-06-20 DIAGNOSIS — E785 Hyperlipidemia, unspecified: Secondary | ICD-10-CM | POA: Diagnosis not present

## 2016-06-20 DIAGNOSIS — E1140 Type 2 diabetes mellitus with diabetic neuropathy, unspecified: Secondary | ICD-10-CM | POA: Diagnosis not present

## 2016-06-20 DIAGNOSIS — L89323 Pressure ulcer of left buttock, stage 3: Secondary | ICD-10-CM | POA: Diagnosis not present

## 2016-06-20 DIAGNOSIS — Z7984 Long term (current) use of oral hypoglycemic drugs: Secondary | ICD-10-CM | POA: Diagnosis not present

## 2016-06-20 DIAGNOSIS — Z794 Long term (current) use of insulin: Secondary | ICD-10-CM | POA: Diagnosis not present

## 2016-06-20 DIAGNOSIS — M1712 Unilateral primary osteoarthritis, left knee: Secondary | ICD-10-CM | POA: Diagnosis not present

## 2016-06-20 DIAGNOSIS — E039 Hypothyroidism, unspecified: Secondary | ICD-10-CM | POA: Diagnosis not present

## 2016-06-20 DIAGNOSIS — L89312 Pressure ulcer of right buttock, stage 2: Secondary | ICD-10-CM | POA: Diagnosis not present

## 2016-06-20 DIAGNOSIS — M1711 Unilateral primary osteoarthritis, right knee: Secondary | ICD-10-CM | POA: Diagnosis not present

## 2016-06-20 DIAGNOSIS — I890 Lymphedema, not elsewhere classified: Secondary | ICD-10-CM | POA: Diagnosis not present

## 2016-06-20 DIAGNOSIS — Z7982 Long term (current) use of aspirin: Secondary | ICD-10-CM | POA: Diagnosis not present

## 2016-06-21 ENCOUNTER — Other Ambulatory Visit: Payer: Self-pay | Admitting: *Deleted

## 2016-06-21 ENCOUNTER — Encounter: Payer: Self-pay | Admitting: *Deleted

## 2016-06-21 DIAGNOSIS — I5040 Unspecified combined systolic (congestive) and diastolic (congestive) heart failure: Secondary | ICD-10-CM | POA: Diagnosis not present

## 2016-06-21 DIAGNOSIS — L89323 Pressure ulcer of left buttock, stage 3: Secondary | ICD-10-CM | POA: Diagnosis not present

## 2016-06-21 DIAGNOSIS — E1140 Type 2 diabetes mellitus with diabetic neuropathy, unspecified: Secondary | ICD-10-CM | POA: Diagnosis not present

## 2016-06-21 DIAGNOSIS — Z794 Long term (current) use of insulin: Secondary | ICD-10-CM | POA: Diagnosis not present

## 2016-06-21 DIAGNOSIS — L89312 Pressure ulcer of right buttock, stage 2: Secondary | ICD-10-CM | POA: Diagnosis not present

## 2016-06-21 DIAGNOSIS — Z7982 Long term (current) use of aspirin: Secondary | ICD-10-CM | POA: Diagnosis not present

## 2016-06-21 DIAGNOSIS — E785 Hyperlipidemia, unspecified: Secondary | ICD-10-CM | POA: Diagnosis not present

## 2016-06-21 DIAGNOSIS — I890 Lymphedema, not elsewhere classified: Secondary | ICD-10-CM | POA: Diagnosis not present

## 2016-06-21 DIAGNOSIS — J449 Chronic obstructive pulmonary disease, unspecified: Secondary | ICD-10-CM | POA: Diagnosis not present

## 2016-06-21 DIAGNOSIS — M1712 Unilateral primary osteoarthritis, left knee: Secondary | ICD-10-CM | POA: Diagnosis not present

## 2016-06-21 DIAGNOSIS — E039 Hypothyroidism, unspecified: Secondary | ICD-10-CM | POA: Diagnosis not present

## 2016-06-21 DIAGNOSIS — M1711 Unilateral primary osteoarthritis, right knee: Secondary | ICD-10-CM | POA: Diagnosis not present

## 2016-06-21 DIAGNOSIS — Z7984 Long term (current) use of oral hypoglycemic drugs: Secondary | ICD-10-CM | POA: Diagnosis not present

## 2016-06-21 DIAGNOSIS — K219 Gastro-esophageal reflux disease without esophagitis: Secondary | ICD-10-CM | POA: Diagnosis not present

## 2016-06-21 NOTE — Patient Outreach (Signed)
[place] [facility]) Care Management  06/21/2016  COPE MARTE 02/28/1946 [id number]   I returned a call to Mr. Kochan at his request. He stated he had a question about one of his medication refills but was able to speak with the nurse at the provider office and have his issue resolved.   He states he continues to do well with regard to lymphedema management and volume management in general. He says he is not having any skin condition concerns at this time.  Plan: I will reach out to Mr. Franca by phone in 2 weeks to follow up and ensure that he is continuing to progress.    [place] [facility]  [phone]

## 2016-06-22 ENCOUNTER — Other Ambulatory Visit: Payer: Self-pay | Admitting: Family Medicine

## 2016-06-24 ENCOUNTER — Ambulatory Visit: Payer: Self-pay | Admitting: *Deleted

## 2016-06-28 ENCOUNTER — Telehealth: Payer: Self-pay | Admitting: *Deleted

## 2016-06-28 DIAGNOSIS — E1140 Type 2 diabetes mellitus with diabetic neuropathy, unspecified: Secondary | ICD-10-CM | POA: Diagnosis not present

## 2016-06-28 DIAGNOSIS — Z7982 Long term (current) use of aspirin: Secondary | ICD-10-CM | POA: Diagnosis not present

## 2016-06-28 DIAGNOSIS — Z7984 Long term (current) use of oral hypoglycemic drugs: Secondary | ICD-10-CM | POA: Diagnosis not present

## 2016-06-28 DIAGNOSIS — M1711 Unilateral primary osteoarthritis, right knee: Secondary | ICD-10-CM | POA: Diagnosis not present

## 2016-06-28 DIAGNOSIS — Z794 Long term (current) use of insulin: Secondary | ICD-10-CM | POA: Diagnosis not present

## 2016-06-28 DIAGNOSIS — E039 Hypothyroidism, unspecified: Secondary | ICD-10-CM | POA: Diagnosis not present

## 2016-06-28 DIAGNOSIS — I5040 Unspecified combined systolic (congestive) and diastolic (congestive) heart failure: Secondary | ICD-10-CM | POA: Diagnosis not present

## 2016-06-28 DIAGNOSIS — K219 Gastro-esophageal reflux disease without esophagitis: Secondary | ICD-10-CM | POA: Diagnosis not present

## 2016-06-28 DIAGNOSIS — J449 Chronic obstructive pulmonary disease, unspecified: Secondary | ICD-10-CM | POA: Diagnosis not present

## 2016-06-28 DIAGNOSIS — L89323 Pressure ulcer of left buttock, stage 3: Secondary | ICD-10-CM | POA: Diagnosis not present

## 2016-06-28 DIAGNOSIS — E785 Hyperlipidemia, unspecified: Secondary | ICD-10-CM | POA: Diagnosis not present

## 2016-06-28 DIAGNOSIS — L89312 Pressure ulcer of right buttock, stage 2: Secondary | ICD-10-CM | POA: Diagnosis not present

## 2016-06-28 DIAGNOSIS — M1712 Unilateral primary osteoarthritis, left knee: Secondary | ICD-10-CM | POA: Diagnosis not present

## 2016-06-28 DIAGNOSIS — I890 Lymphedema, not elsewhere classified: Secondary | ICD-10-CM | POA: Diagnosis not present

## 2016-06-28 NOTE — Telephone Encounter (Signed)
Call placed to patient and patient made aware.   States that he will F/U on Monday if he can move up appointment.

## 2016-06-28 NOTE — Telephone Encounter (Signed)
If they dont accept the old notes, then pt needs to come in

## 2016-06-28 NOTE — Telephone Encounter (Signed)
Caryl Pina, [facility] fax- [phone].

## 2016-06-28 NOTE — Telephone Encounter (Signed)
Received call from [facility] with [facility] [phone].  Reports request was faxed to office for trapeze bar to assist patient with repositioning in bed.   States that she requires face to face notes for bar.   MD please advise.

## 2016-07-01 DIAGNOSIS — H2511 Age-related nuclear cataract, right eye: Secondary | ICD-10-CM | POA: Diagnosis not present

## 2016-07-01 DIAGNOSIS — E119 Type 2 diabetes mellitus without complications: Secondary | ICD-10-CM | POA: Diagnosis not present

## 2016-07-01 DIAGNOSIS — H2513 Age-related nuclear cataract, bilateral: Secondary | ICD-10-CM | POA: Diagnosis not present

## 2016-07-02 ENCOUNTER — Other Ambulatory Visit: Payer: Self-pay | Admitting: *Deleted

## 2016-07-02 NOTE — Patient Outreach (Signed)
[place] [facility]) Care Management  07/02/2016  Michael [name] December 22, 1945 [id number]  Michael [name] is an 70 y.o. male with chronic diastolic heart failure with EF 60-65%, history of bradycardia, type II Diabetes, Hypertension, Iron Deficiency Anemia, NASH + Hepatomegaly, and degeneratove disc disease s/p surgery of L2-L3.   [name] was admitted to the hospital in February with Acute/Chronic Diastolic Heart Failure and worsening lymphedema. Thereafter, he spent 21 days at [facility]. [name] was referred to [place] [facility] for assistance with management of CHF.   [facility] Care Management has been following [name] in the community since his discharge from SNF. Focus of care management services has been around CHF and DM Disease management with particular attention to volume management and peripheral edema/lymphedema and lower extremity skin ailments.   Chronic Health Condition (Lymphedema) - [name] attended the lymphedema treatment program at [place] he is very pleased with the improvement in his lower extremity swelling and skin condition. He is now participating in outpatient PT for strengthening,conditioning, and ambulation. Today, he reports "My legs are looking great and I'm doing good with PT, getting stronger."   Chronic Health Condition (Skin) - [name] continues to have difficulty with skin problems related to immobility and long periods of sitting either in his living room chair or wheelchair. He has been followed at home by a home health nurse and her last visit was today.   Pending Surgery (cataract) - [name] is scheduled for a pre-op visit tomorrow for his upcoming cataract surgery on Monday. He says he is nervous but excited about "finally getting this done."  Chronic Health Condition (CHF) -  [name] denies any signs or symptoms of worsening CHF. His weight today is 192#. He is not short of breath, has a good  appetite, is voiding as usual, and has not had chest discomfort.   Chronic Health Condition (DM) - [name] cbg's have improved over the last month and today was 152 fasting.   Plan: I will reach out to [name] by phone early next week but after his cataract surgery and will likely plan a home visit in the next 2 weeks to make sure his skin condition is maintained.    [facility]  [phone]

## 2016-07-02 NOTE — Patient Instructions (Addendum)
Your procedure is scheduled on: 07/08/2016  Report to [facility] at  7:45  AM.  Call this number if you have problems the morning of surgery: [phone]   Do not eat food or drink liquids :After Midnight.      Take these medicines the morning of surgery with A SIP OF WATER: K DUR, PRILOSEC, SYNTHROID, LOTENSIN, BUMEX, NEURONTIN AND PERCOCET   Do not wear jewelry, make-up or nail polish.  Do not wear lotions, powders, or perfumes. You may wear deodorant.  Do not shave 48 hours prior to surgery.  Do not bring valuables to the hospital.  Contacts, dentures or bridgework may not be worn into surgery.  Leave suitcase in the car. After surgery it may be brought to your room.  For patients admitted to the hospital, checkout time is 11:00 AM the day of discharge.   Patients discharged the day of surgery will not be allowed to drive home.  :     Please read over the following fact sheets that you were given: Coughing and Deep Breathing, Surgical Site Infection Prevention, Anesthesia Post-op Instructions and Care and Recovery After Surgery    Cataract A cataract is a clouding of the lens of the eye. When a lens becomes cloudy, vision is reduced based on the degree and nature of the clouding. Many cataracts reduce vision to some degree. Some cataracts make people more near-sighted as they develop. Other cataracts increase glare. Cataracts that are ignored and become worse can sometimes look white. The white color can be seen through the pupil. CAUSES   Aging. However, cataracts may occur at any age, even in newborns.   Certain drugs.   Trauma to the eye.   Certain diseases such as diabetes.   Specific eye diseases such as chronic inflammation inside the eye or a sudden attack of a rare form of glaucoma.   Inherited or acquired medical problems.  SYMPTOMS   Gradual, progressive drop in vision in the affected eye.   Severe, rapid visual loss. This most often happens when trauma is the  cause.  DIAGNOSIS  To detect a cataract, an eye doctor examines the lens. Cataracts are best diagnosed with an exam of the eyes with the pupils enlarged (dilated) by drops.  TREATMENT  For an early cataract, vision may improve by using different eyeglasses or stronger lighting. If that does not help your vision, surgery is the only effective treatment. A cataract needs to be surgically removed when vision loss interferes with your everyday activities, such as driving, reading, or watching TV. A cataract may also have to be removed if it prevents examination or treatment of another eye problem. Surgery removes the cloudy lens and usually replaces it with a substitute lens (intraocular lens, IOL).  At a time when both you and your doctor agree, the cataract will be surgically removed. If you have cataracts in both eyes, only one is usually removed at a time. This allows the operated eye to heal and be out of danger from any possible problems after surgery (such as infection or poor wound healing). In rare cases, a cataract may be doing damage to your eye. In these cases, your caregiver may advise surgical removal right away. The vast majority of people who have cataract surgery have better vision afterward. HOME CARE INSTRUCTIONS  If you are not planning surgery, you may be asked to do the following:  Use different eyeglasses.   Use stronger or brighter lighting.   Ask your  eye doctor about reducing your medicine dose or changing medicines if it is thought that a medicine caused your cataract. Changing medicines does not make the cataract go away on its own.   Become familiar with your surroundings. Poor vision can lead to injury. Avoid bumping into things on the affected side. You are at a higher risk for tripping or falling.   Exercise extreme care when driving or operating machinery.   Wear sunglasses if you are sensitive to bright light or experiencing problems with glare.  SEEK IMMEDIATE  MEDICAL CARE IF:   You have a worsening or sudden vision loss.   You notice redness, swelling, or increasing pain in the eye.   You have a fever.  Document Released: 09/16/2005 Document Revised: 09/05/2011 Document Reviewed: 05/10/2011 [facility] Patient Information 2012 [place].PATIENT INSTRUCTIONS POST-ANESTHESIA  IMMEDIATELY FOLLOWING SURGERY:  Do not drive or operate machinery for the first twenty four hours after surgery.  Do not make any important decisions for twenty four hours after surgery or while taking narcotic pain medications or sedatives.  If you develop intractable nausea and vomiting or a severe headache please notify your doctor immediately.  FOLLOW-UP:  Please make an appointment with your surgeon as instructed. You do not need to follow up with anesthesia unless specifically instructed to do so.  WOUND CARE INSTRUCTIONS (if applicable):  Keep a dry clean dressing on the anesthesia/puncture wound site if there is drainage.  Once the wound has quit draining you may leave it open to air.  Generally you should leave the bandage intact for twenty four hours unless there is drainage.  If the epidural site drains for more than 36-48 hours please call the anesthesia department.  QUESTIONS?:  Please feel free to call your physician or the hospital operator if you have any questions, and they will be happy to assist you.

## 2016-07-03 ENCOUNTER — Encounter (HOSPITAL_COMMUNITY)
Admission: RE | Admit: 2016-07-03 | Discharge: 2016-07-03 | Disposition: A | Discharge status: Home / Self Care | Payer: Medicare Other | Source: Ambulatory Visit | Attending: Ophthalmology | Admitting: Ophthalmology

## 2016-07-03 ENCOUNTER — Encounter (HOSPITAL_COMMUNITY): Payer: Self-pay

## 2016-07-03 DIAGNOSIS — R9431 Abnormal electrocardiogram [ECG] [EKG]: Secondary | ICD-10-CM | POA: Insufficient documentation

## 2016-07-03 DIAGNOSIS — H269 Unspecified cataract: Secondary | ICD-10-CM | POA: Insufficient documentation

## 2016-07-03 DIAGNOSIS — Z01818 Encounter for other preprocedural examination: Secondary | ICD-10-CM | POA: Diagnosis not present

## 2016-07-03 DIAGNOSIS — Z01812 Encounter for preprocedural laboratory examination: Secondary | ICD-10-CM | POA: Diagnosis not present

## 2016-07-03 HISTORY — DX: Heart failure, unspecified: I50.9

## 2016-07-03 HISTORY — DX: Lymphedema, not elsewhere classified: I89.0

## 2016-07-03 LAB — CBC WITH DIFFERENTIAL/PLATELET
BASOS PCT: 2 %
Basophils Absolute: 0.1 10*3/uL (ref 0.0–0.1)
EOS ABS: 0.3 10*3/uL (ref 0.0–0.7)
EOS PCT: 5 %
HCT: 31.2 % — ABNORMAL LOW (ref 39.0–52.0)
Hemoglobin: 10.8 g/dL — ABNORMAL LOW (ref 13.0–17.0)
LYMPHS ABS: 1.7 10*3/uL (ref 0.7–4.0)
Lymphocytes Relative: 36 %
MCH: 29.3 pg (ref 26.0–34.0)
MCHC: 34.6 g/dL (ref 30.0–36.0)
MCV: 84.8 fL (ref 78.0–100.0)
MONOS PCT: 10 %
Monocytes Absolute: 0.5 10*3/uL (ref 0.1–1.0)
Neutro Abs: 2.2 10*3/uL (ref 1.7–7.7)
Neutrophils Relative %: 47 %
PLATELETS: 203 10*3/uL (ref 150–400)
RBC: 3.68 MIL/uL — ABNORMAL LOW (ref 4.22–5.81)
RDW: 13.7 % (ref 11.5–15.5)
WBC: 4.7 10*3/uL (ref 4.0–10.5)

## 2016-07-03 LAB — BASIC METABOLIC PANEL
Anion gap: 9 (ref 5–15)
BUN: 37 mg/dL — AB (ref 6–20)
CALCIUM: 8.8 mg/dL — AB (ref 8.9–10.3)
CO2: 26 mmol/L (ref 22–32)
CREATININE: 1.42 mg/dL — AB (ref 0.61–1.24)
Chloride: 96 mmol/L — ABNORMAL LOW (ref 101–111)
GFR, EST AFRICAN AMERICAN: 57 mL/min — AB (ref >60)
GFR, EST NON AFRICAN AMERICAN: 49 mL/min — AB (ref >60)
Glucose, Bld: 112 mg/dL — ABNORMAL HIGH (ref 65–99)
Potassium: 3.6 mmol/L (ref 3.5–5.1)
SODIUM: 131 mmol/L — AB (ref 135–145)

## 2016-07-05 DIAGNOSIS — E1140 Type 2 diabetes mellitus with diabetic neuropathy, unspecified: Secondary | ICD-10-CM | POA: Diagnosis not present

## 2016-07-05 DIAGNOSIS — M1712 Unilateral primary osteoarthritis, left knee: Secondary | ICD-10-CM | POA: Diagnosis not present

## 2016-07-05 DIAGNOSIS — Z794 Long term (current) use of insulin: Secondary | ICD-10-CM | POA: Diagnosis not present

## 2016-07-05 DIAGNOSIS — I5040 Unspecified combined systolic (congestive) and diastolic (congestive) heart failure: Secondary | ICD-10-CM | POA: Diagnosis not present

## 2016-07-05 DIAGNOSIS — I890 Lymphedema, not elsewhere classified: Secondary | ICD-10-CM | POA: Diagnosis not present

## 2016-07-05 DIAGNOSIS — E785 Hyperlipidemia, unspecified: Secondary | ICD-10-CM | POA: Diagnosis not present

## 2016-07-05 DIAGNOSIS — K219 Gastro-esophageal reflux disease without esophagitis: Secondary | ICD-10-CM | POA: Diagnosis not present

## 2016-07-05 DIAGNOSIS — J449 Chronic obstructive pulmonary disease, unspecified: Secondary | ICD-10-CM | POA: Diagnosis not present

## 2016-07-05 DIAGNOSIS — L89312 Pressure ulcer of right buttock, stage 2: Secondary | ICD-10-CM | POA: Diagnosis not present

## 2016-07-05 DIAGNOSIS — Z7984 Long term (current) use of oral hypoglycemic drugs: Secondary | ICD-10-CM | POA: Diagnosis not present

## 2016-07-05 DIAGNOSIS — M1711 Unilateral primary osteoarthritis, right knee: Secondary | ICD-10-CM | POA: Diagnosis not present

## 2016-07-05 DIAGNOSIS — E039 Hypothyroidism, unspecified: Secondary | ICD-10-CM | POA: Diagnosis not present

## 2016-07-05 DIAGNOSIS — L89323 Pressure ulcer of left buttock, stage 3: Secondary | ICD-10-CM | POA: Diagnosis not present

## 2016-07-05 DIAGNOSIS — Z7982 Long term (current) use of aspirin: Secondary | ICD-10-CM | POA: Diagnosis not present

## 2016-07-08 ENCOUNTER — Ambulatory Visit (HOSPITAL_COMMUNITY): Payer: Medicare Other | Admitting: Anesthesiology

## 2016-07-08 ENCOUNTER — Ambulatory Visit (HOSPITAL_COMMUNITY)
Admission: RE | Admit: 2016-07-08 | Discharge: 2016-07-08 | Disposition: A | Discharge status: Home / Self Care | Payer: Medicare Other | Source: Ambulatory Visit | Attending: Ophthalmology | Admitting: Ophthalmology

## 2016-07-08 ENCOUNTER — Encounter (HOSPITAL_COMMUNITY)
Admission: RE | Disposition: A | Discharge status: Home / Self Care | Payer: Self-pay | Source: Ambulatory Visit | Attending: Ophthalmology

## 2016-07-08 ENCOUNTER — Encounter (HOSPITAL_COMMUNITY): Payer: Self-pay | Admitting: Ophthalmology

## 2016-07-08 DIAGNOSIS — F329 Major depressive disorder, single episode, unspecified: Secondary | ICD-10-CM | POA: Insufficient documentation

## 2016-07-08 DIAGNOSIS — I110 Hypertensive heart disease with heart failure: Secondary | ICD-10-CM | POA: Diagnosis not present

## 2016-07-08 DIAGNOSIS — I509 Heart failure, unspecified: Secondary | ICD-10-CM | POA: Insufficient documentation

## 2016-07-08 DIAGNOSIS — M1990 Unspecified osteoarthritis, unspecified site: Secondary | ICD-10-CM | POA: Insufficient documentation

## 2016-07-08 DIAGNOSIS — H2511 Age-related nuclear cataract, right eye: Secondary | ICD-10-CM | POA: Insufficient documentation

## 2016-07-08 DIAGNOSIS — K219 Gastro-esophageal reflux disease without esophagitis: Secondary | ICD-10-CM | POA: Insufficient documentation

## 2016-07-08 DIAGNOSIS — J449 Chronic obstructive pulmonary disease, unspecified: Secondary | ICD-10-CM | POA: Insufficient documentation

## 2016-07-08 DIAGNOSIS — E119 Type 2 diabetes mellitus without complications: Secondary | ICD-10-CM | POA: Diagnosis not present

## 2016-07-08 DIAGNOSIS — Z87891 Personal history of nicotine dependence: Secondary | ICD-10-CM | POA: Diagnosis not present

## 2016-07-08 DIAGNOSIS — D649 Anemia, unspecified: Secondary | ICD-10-CM | POA: Diagnosis not present

## 2016-07-08 DIAGNOSIS — K7581 Nonalcoholic steatohepatitis (NASH): Secondary | ICD-10-CM | POA: Diagnosis not present

## 2016-07-08 DIAGNOSIS — H269 Unspecified cataract: Secondary | ICD-10-CM | POA: Diagnosis not present

## 2016-07-08 HISTORY — PX: CATARACT EXTRACTION W/PHACO: SHX586

## 2016-07-08 LAB — GLUCOSE, CAPILLARY: Glucose-Capillary: 124 mg/dL — ABNORMAL HIGH (ref 65–99)

## 2016-07-08 SURGERY — PHACOEMULSIFICATION, CATARACT, WITH IOL INSERTION
Anesthesia: Monitor Anesthesia Care | Site: Eye | Laterality: Right

## 2016-07-08 MED ORDER — FENTANYL CITRATE (PF) 100 MCG/2ML IJ SOLN
INTRAMUSCULAR | Status: AC
  Filled 2016-07-08: qty 2

## 2016-07-08 MED ORDER — LIDOCAINE HCL (PF) 1 % IJ SOLN
INTRAMUSCULAR | Status: DC | PRN
  Administered 2016-07-08: .5 mL

## 2016-07-08 MED ORDER — TETRACAINE HCL 0.5 % OP SOLN
1.0000 [drp] | OPHTHALMIC | Status: AC
  Administered 2016-07-08 (×3): 1 [drp] via OPHTHALMIC

## 2016-07-08 MED ORDER — POVIDONE-IODINE 5 % OP SOLN
OPHTHALMIC | Status: DC | PRN
  Administered 2016-07-08: 1 via OPHTHALMIC

## 2016-07-08 MED ORDER — CYCLOPENTOLATE-PHENYLEPHRINE 0.2-1 % OP SOLN
1.0000 [drp] | OPHTHALMIC | Status: AC
  Administered 2016-07-08 (×3): 1 [drp] via OPHTHALMIC

## 2016-07-08 MED ORDER — NEOMYCIN-POLYMYXIN-DEXAMETH 3.5-10000-0.1 OP SUSP
OPHTHALMIC | Status: DC | PRN
  Administered 2016-07-08: 2 [drp] via OPHTHALMIC

## 2016-07-08 MED ORDER — MIDAZOLAM HCL 2 MG/2ML IJ SOLN
INTRAMUSCULAR | Status: AC
  Filled 2016-07-08: qty 2

## 2016-07-08 MED ORDER — TETRACAINE HCL 0.5 % OP SOLN
OPHTHALMIC | Status: AC
  Filled 2016-07-08: qty 4

## 2016-07-08 MED ORDER — LACTATED RINGERS IV SOLN
INTRAVENOUS | Status: DC
  Administered 2016-07-08: 1000 mL via INTRAVENOUS

## 2016-07-08 MED ORDER — PROVISC 10 MG/ML IO SOLN
INTRAOCULAR | Status: DC | PRN
  Administered 2016-07-08: 0.85 mL via INTRAOCULAR

## 2016-07-08 MED ORDER — FENTANYL CITRATE (PF) 100 MCG/2ML IJ SOLN
25.0000 ug | INTRAMUSCULAR | Status: AC | PRN
  Administered 2016-07-08 (×2): 25 ug via INTRAVENOUS

## 2016-07-08 MED ORDER — MIDAZOLAM HCL 2 MG/2ML IJ SOLN
1.0000 mg | INTRAMUSCULAR | Status: DC | PRN
  Administered 2016-07-08: 2 mg via INTRAVENOUS

## 2016-07-08 MED ORDER — BSS IO SOLN
INTRAOCULAR | Status: DC | PRN
  Administered 2016-07-08: 15 mL

## 2016-07-08 MED ORDER — PHENYLEPHRINE HCL 2.5 % OP SOLN
1.0000 [drp] | OPHTHALMIC | Status: AC
  Administered 2016-07-08 (×3): 1 [drp] via OPHTHALMIC

## 2016-07-08 MED ORDER — EPINEPHRINE HCL 1 MG/ML IJ SOLN
INTRAMUSCULAR | Status: AC
  Filled 2016-07-08: qty 1

## 2016-07-08 MED ORDER — LIDOCAINE HCL 3.5 % OP GEL
1.0000 "application " | Freq: Once | OPHTHALMIC | Status: AC
  Administered 2016-07-08: 1 via OPHTHALMIC

## 2016-07-08 MED ORDER — EPINEPHRINE HCL 1 MG/ML IJ SOLN
INTRAOCULAR | Status: DC | PRN
  Administered 2016-07-08: 500 mL

## 2016-07-08 SURGICAL SUPPLY — 11 items
CLOTH BEACON ORANGE TIMEOUT ST (SAFETY) ×2 IMPLANT
EYE SHIELD UNIVERSAL CLEAR (GAUZE/BANDAGES/DRESSINGS) ×2 IMPLANT
GLOVE BIOGEL PI IND STRL 7.0 (GLOVE) IMPLANT
GLOVE BIOGEL PI INDICATOR 7.0 (GLOVE) ×2
GLOVE EXAM NITRILE MD LF STRL (GLOVE) ×2 IMPLANT
PAD ARMBOARD 7.5X6 YLW CONV (MISCELLANEOUS) ×2 IMPLANT
SIGHTPATH CAT PROC W REG LENS (Ophthalmic Related) ×3 IMPLANT
SYRINGE LUER LOK 1CC (MISCELLANEOUS) ×2 IMPLANT
TAPE SURG TRANSPORE 1 IN (GAUZE/BANDAGES/DRESSINGS) IMPLANT
TAPE SURGICAL TRANSPORE 1 IN (GAUZE/BANDAGES/DRESSINGS) ×2
WATER STERILE IRR 250ML POUR (IV SOLUTION) ×2 IMPLANT

## 2016-07-08 NOTE — Anesthesia Procedure Notes (Signed)
Procedure Name: MAC Date/Time: 07/08/2016 9:32 AM Performed by: [facility] Pre-anesthesia Checklist: Patient identified, Emergency Drugs available, Suction available, Timeout performed and Patient being monitored Patient Re-evaluated:Patient Re-evaluated prior to inductionOxygen Delivery Method: Nasal Cannula

## 2016-07-08 NOTE — Transfer of Care (Signed)
Immediate Anesthesia Transfer of Care Note  Patient: Michael [name]  Procedure(s) Performed: Procedure(s) (LRB): CATARACT EXTRACTION PHACO AND INTRAOCULAR LENS PLACEMENT RIGHT EYE (Right)  Patient Location: Shortstay  Anesthesia Type: MAC  Level of Consciousness: awake  Airway & Oxygen Therapy: Patient Spontanous Breathing   Post-op Assessment: Report given to PACU RN, Post -op Vital signs reviewed and stable and Patient moving all extremities  Post vital signs: Reviewed and stable  Complications: No apparent anesthesia complications

## 2016-07-08 NOTE — Anesthesia Preprocedure Evaluation (Signed)
Anesthesia Evaluation  Patient identified by MRN, date of birth, ID band Patient awake    Reviewed: Allergy & Precautions, H&P , NPO status , Patient's Chart, lab work & pertinent test results  Airway Mallampati: II  TM Distance: >3 FB     Dental  (+) Teeth Intact, Poor Dentition,    Pulmonary COPD, former smoker,    breath sounds clear to auscultation       Cardiovascular hypertension, Pt. on medications +CHF   Rhythm:Regular Rate:Normal     Neuro/Psych PSYCHIATRIC DISORDERS Depression  Neuromuscular disease    GI/Hepatic GERD  Medicated and Controlled,(+) Hepatitis - (NASH)  Endo/Other  diabetes, Type 2, Oral Hypoglycemic AgentsHypothyroidism   Renal/GU      Musculoskeletal   Abdominal   Peds  Hematology  (+) anemia ,   Anesthesia Other Findings   Reproductive/Obstetrics                             Anesthesia Physical Anesthesia Plan  ASA: III  Anesthesia Plan: MAC   Post-op Pain Management:    Induction: Intravenous  Airway Management Planned: Nasal Cannula  Additional Equipment:   Intra-op Plan:   Post-operative Plan:   Informed Consent: I have reviewed the patients History and Physical, chart, labs and discussed the procedure including the risks, benefits and alternatives for the proposed anesthesia with the patient or authorized representative who has indicated his/her understanding and acceptance.     Plan Discussed with:   Anesthesia Plan Comments:         Anesthesia Quick Evaluation

## 2016-07-08 NOTE — Op Note (Signed)
Date of Admission: 07/08/2016  Date of Surgery: 07/08/2016   Pre-Op Dx: Cataract Right Eye  Post-Op Dx: Senile Nuclear Cataract Right  Eye,  Dx Code H25.11  Surgeon: Tonny Branch, M.D.  Assistants: None  Anesthesia: Topical with MAC  Indications: Painless, progressive loss of vision with compromise of daily activities.  Surgery: Cataract Extraction with Intraocular lens Implant Right Eye  Discription: The patient had dilating drops and viscous lidocaine placed into the Right eye in the pre-op holding area. After transfer to the operating room, a time out was performed. The patient was then prepped and draped. Beginning with a 63 degree blade a paracentesis port was made at the surgeon's 2 o'clock position. The anterior chamber was then filled with 1% non-preserved lidocaine. This was followed by filling the anterior chamber with Provisc.  A 2.61m keratome blade was used to make a clear corneal incision at the temporal limbus.  A bent cystatome needle was used to create a continuous tear capsulotomy. Hydrodissection was performed with balanced salt solution on a Fine canula. The lens nucleus was then removed using the phacoemulsification handpiece. Residual cortex was removed with the I&A handpiece. The anterior chamber and capsular bag were refilled with Provisc. A posterior chamber intraocular lens was placed into the capsular bag with it's injector. The implant was positioned with the Kuglan hook. The Provisc was then removed from the anterior chamber and capsular bag with the I&A handpiece. Stromal hydration of the main incision and paracentesis port was performed with BSS on a Fine canula. The wounds were tested for leak which was negative. The patient tolerated the procedure well. There were no operative complications. The patient was then transferred to the recovery room in stable condition.  Complications: None  Specimen: None  EBL: None  Prosthetic device: Hoya iSert 250, power 15.0 D,  SN [id number]

## 2016-07-08 NOTE — Discharge Instructions (Signed)

## 2016-07-08 NOTE — H&P (Signed)
I have reviewed the H&P, the patient was re-examined, and I have identified no interval changes in medical condition and plan of care since the history and physical of record  

## 2016-07-08 NOTE — Anesthesia Postprocedure Evaluation (Signed)
  Anesthesia Post-op Note  Patient: Michael [name]  Procedure(s) Performed: Procedure(s) (LRB): CATARACT EXTRACTION PHACO AND INTRAOCULAR LENS PLACEMENT RIGHT EYE (Right)  Patient Location:  Short Stay  Anesthesia Type: MAC  Level of Consciousness: awake  Airway and Oxygen Therapy: Patient Spontanous Breathing  Post-op Pain: none  Post-op Assessment: Post-op Vital signs reviewed, Patient's Cardiovascular Status Stable, Respiratory Function Stable, Patent Airway, No signs of Nausea or vomiting and Pain level controlled  Post-op Vital Signs: Reviewed and stable  Complications: No apparent anesthesia complications Anesthesia Post Note  Patient: Michael [name]  Procedure(s) Performed: Procedure(s) (LRB): CATARACT EXTRACTION PHACO AND INTRAOCULAR LENS PLACEMENT RIGHT EYE (Right)  Anesthesia Post Evaluation  Last Vitals:  Vitals:   07/08/16 0915 07/08/16 0920  BP: (!) 108/57 (!) 98/58  Resp: (!) 0 13  Temp:      Last Pain: There were no vitals filed for this visit.               Drucie Opitz

## 2016-07-09 ENCOUNTER — Other Ambulatory Visit: Payer: Self-pay | Admitting: *Deleted

## 2016-07-09 ENCOUNTER — Encounter: Payer: Self-pay | Admitting: *Deleted

## 2016-07-09 NOTE — Patient Outreach (Signed)
[facility]) Care Management  07/09/2016  Michael [name] 08-28-1946 [id number]  Michael Core Goodwinis an 70 y.o.malewith chronic diastolic heart failure with EF 60-65%, history of bradycardia, type II Diabetes, Hypertension, Iron Deficiency Anemia, NASH + Hepatomegaly, and degeneratove disc disease s/p surgery of L2-L3.   Michael [name] was admitted to the hospital in February with Acute/Chronic Diastolic Heart Failure and worsening lymphedema. Thereafter, he spent 21 days at [facility]. Michael [name] was referred to [facility] for assistance with management of CHF.   Michael [name] Care Management has been following Michael [name] in the community since his discharge from SNF. Focus of care management services has been around CHF and DM Disease management with particular attention to volume management and peripheral edema/lymphedema and lower extremity skin ailments.   I am calling today to follow up on his cataract surgery yesterday. He said "I just can't believe how easy that was. And I can see things so clear today it almost scared me!"   Michael [name] continues to weigh himself daily and take his medications as prescribed. His weight today was 193#. His CBG's are in the 110-172 range and he is experiencing no hypo or hyperglycemia. His legs remain stable with drastic improvement after lymphedema treatment. His home care visits for skin care to his sacrum have stopped and he says his skin is completely intact.   Plan: I will reach out to Michael [name] by phone next month. We will maintain phone assessments for a few months and if he continues to remain stable, I will either discharge him or transition him to our telephonic team.    [facility]  [phone]

## 2016-07-11 ENCOUNTER — Encounter (HOSPITAL_COMMUNITY): Payer: Self-pay | Admitting: Ophthalmology

## 2016-07-15 DIAGNOSIS — H5231 Anisometropia: Secondary | ICD-10-CM | POA: Diagnosis not present

## 2016-07-15 DIAGNOSIS — H2512 Age-related nuclear cataract, left eye: Secondary | ICD-10-CM | POA: Diagnosis not present

## 2016-07-15 DIAGNOSIS — H5212 Myopia, left eye: Secondary | ICD-10-CM | POA: Diagnosis not present

## 2016-07-15 DIAGNOSIS — Z961 Presence of intraocular lens: Secondary | ICD-10-CM | POA: Diagnosis not present

## 2016-07-17 ENCOUNTER — Encounter (HOSPITAL_COMMUNITY): Payer: Self-pay

## 2016-07-17 ENCOUNTER — Encounter (HOSPITAL_COMMUNITY)
Admission: RE | Admit: 2016-07-17 | Discharge: 2016-07-17 | Disposition: A | Discharge status: Home / Self Care | Payer: Medicare Other | Source: Ambulatory Visit | Attending: Ophthalmology | Admitting: Ophthalmology

## 2016-07-17 ENCOUNTER — Telehealth: Payer: Self-pay | Admitting: Family Medicine

## 2016-07-17 MED ORDER — OXYCODONE-ACETAMINOPHEN 7.5-325 MG PO TABS: ORAL_TABLET | ORAL | 0 refills | Status: DC

## 2016-07-17 NOTE — Telephone Encounter (Signed)
Patient calling to ger rx for his hydrocodone  [phone]

## 2016-07-17 NOTE — Telephone Encounter (Signed)
Ok to refill Oxycodone??  Last office visit 03/25/2016.  Last refill 06/19/2016.

## 2016-07-17 NOTE — Telephone Encounter (Signed)
Okay 

## 2016-07-17 NOTE — Telephone Encounter (Signed)
Prescription printed and patient made aware to come to office to pick up on 07/18/2016.

## 2016-07-18 ENCOUNTER — Ambulatory Visit (INDEPENDENT_AMBULATORY_CARE_PROVIDER_SITE_OTHER): Payer: Medicare Other | Admitting: Cardiology

## 2016-07-18 ENCOUNTER — Encounter: Payer: Self-pay | Admitting: Cardiology

## 2016-07-18 VITALS — BP 107/68 | HR 61 | Ht 59.0 in | Wt 191.8 lb

## 2016-07-18 DIAGNOSIS — I10 Essential (primary) hypertension: Secondary | ICD-10-CM

## 2016-07-18 DIAGNOSIS — R600 Localized edema: Secondary | ICD-10-CM

## 2016-07-18 NOTE — Progress Notes (Signed)
Clinical Summary Michael [name] is a 70 y.o.male seen today for follow up of the following medical problems.   1. Chronic diastolic heart failure - echo 05/2015 with normal LVEF 60-65%, grade II diastolic dysfunction - Jan 2017 echo LVEF 40-98%, grade I diastolic dysfunction - edema likely combined diastolic HF and lymphedema   - edema continues to improve. Weight down to 191 lbs. On bumex 28m bid, and metolazone 2.534mdaily, takes additional as needed.  - completed lymphedema clinic.     Past Medical History:  Diagnosis Date  . Arthritis   . Congestive heart disease ([facility]CD[place]03/2017  . COPD (chronic obstructive pulmonary disease) (HCZwol[facility]  . DDD (degenerative disc disease), lumbosacral   . Depression   . Diabetic neuropathy (H[facility]  . Diastolic dysfunction   . Essential hypertension   . GERD (gastroesophageal reflux disease)   . Hyperlipidemia   . Iron deficiency anemia   . Left knee DJD   . Lumbar spinal stenosis   . Lymphedema 11/2015   BOTH LEGS  . NASH (nonalcoholic steatohepatitis)   . Peripheral edema   . Peripheral edema 10/05/2015  . Type 2 diabetes mellitus (HCC)      No Known Allergies   Current Outpatient Prescriptions  Medication Sig Dispense Refill  . ACCU-CHEK AVIVA PLUS test strip USE AS DIRECTED TWICE DAILY. 100 each 0  . aspirin EC 81 MG tablet Take 81 mg by mouth daily.    . B-D ULTRAFINE III SHORT PEN 31G X 8 MM MISC USE AS DIRECTED DAILY WITH LANTUS. 100 each 0  . benazepril (LOTENSIN) 40 MG tablet TAKE ONE TABLET BY MOUTH DAILY. 90 tablet 0  . BESIVANCE 0.6 % SUSP Place 1 drop into both eyes as directed. Completed therapy for left eye 07/15/16  To begin therapy for right eye on 07/19/16 (Three times daily)    . bumetanide (BUMEX) 2 MG tablet TAKE 1&1/2 TABLETS BY MOUTH TWICE DAILY. (Patient taking differently: TAKE 2 MG (1 TABLET) TABLETS BY MOUTH TWICE DAILY.) 135 tablet 3  . BYSTOLIC 10 MG tablet TAKE ONE TABLET BY MOUTH DAILY. 30 tablet 2  .  clobetasol cream (TEMOVATE) 0.05 % APPLY TO AFFECTED AREA 2 TIMES DAILY. (Patient not taking: Reported on 07/16/2016) 45 g 11  . diazepam (VALIUM) 5 MG tablet TAKE 1 TABLET BY MOUTH AT BEDTIME AS NEEDED FOR ANXIETY. 30 tablet 2  . diclofenac sodium (VOLTAREN) 1 % GEL Apply 4 g topically 4 (four) times daily. (Patient taking differently: Apply 4 g topically 4 (four) times daily as needed (pain). ) 5 Tube 5  . DUREZOL 0.05 % EMUL Place 1 drop into both eyes 3 (three) times daily. Patient to continue therapy in LEFT EYE until 08/05/16. Patient to begin therapy course for RIGHT EYE on 07/22/16 following surgery and to continue for 3 weeks    . Ferrous Sulfate (IRON) 325 (65 FE) MG TABS Take 1 tablet by mouth daily.     . Marland Kitchenabapentin (NEURONTIN) 400 MG capsule TAKE ONE CAPSULE BY MOUTH THREE TIMES DAILY. 90 capsule 2  . ibuprofen (ADVIL,MOTRIN) 800 MG tablet Take 800 mg by mouth 3 (three) times daily as needed. For pain    . LANTUS SOLOSTAR 100 UNIT/ML Solostar Pen INJECT 30 UNITS SUBCUTANEOUSLY AT BEDTIME. 15 mL 3  . levothyroxine (SYNTHROID, LEVOTHROID) 88 MCG tablet TAKE 1 TABLET BY MOUTH DAILY BEFORE BREAKFAST. 30 tablet 3  . Magnesium 400 MG TABS Take 400 mg by mouth 2 (two)  times daily. (Patient not taking: Reported on 07/16/2016) 60 tablet 6  . meloxicam (MOBIC) 15 MG tablet Take 15 mg by mouth daily.    . metFORMIN (GLUCOPHAGE) 1000 MG tablet TAKE ONE TABLET BY MOUTH TWICE DAILY WITH A MEAL. 180 tablet 3  . metolazone (ZAROXOLYN) 2.5 MG tablet Take 1 tablet (2.5 mg total) by mouth daily. 30 tablet 3  . nystatin (MYCOSTATIN/NYSTOP) powder Apply topically 4 (four) times daily. (Patient not taking: Reported on 07/16/2016) 15 g 0  . nystatin cream (MYCOSTATIN) Apply 1 application topically 2 (two) times daily. (Patient not taking: Reported on 07/16/2016) 45 g 1  . omeprazole (PRILOSEC) 20 MG capsule TAKE ONE CAPSULE BY MOUTH TWICE DAILY. 180 capsule 1  . oxyCODONE-acetaminophen (PERCOCET) 7.5-325 MG  tablet Take one tablet by mouth every 6 hours as needed for severe pain. Max APAP 3gm/24hrs from all sources 120 tablet 0  . penicillin v potassium (VEETID) 500 MG tablet Take 500 mg by mouth 4 (four) times daily. 7 day therapy course patient to complete on 07/19/16    . potassium chloride (K-DUR) 10 MEQ tablet Take 1 tablet (10 mEq total) by mouth 2 (two) times daily. (Patient not taking: Reported on 07/16/2016) 180 tablet 3  . pravastatin (PRAVACHOL) 40 MG tablet TAKE ONE TABLET BY MOUTH DAILY. (Patient taking differently: TAKE ONE TABLET BY MOUTH DAILY IN THE EVENING) 90 tablet 0  . PROLENSA 0.07 % SOLN Place 1 drop into both eyes as directed. To complete course on left 08/05/16 and to begin therapy for right eye on 07/19/16 (once daily)     No current facility-administered medications for this visit.      Past Surgical History:  Procedure Laterality Date  . BIOPSY N/A 07/21/2014   Procedure: BIOPSY;  Surgeon: Daneil Dolin, MD;  Location: [facility];  Service: Endoscopy;  Laterality: N/A;  . CATARACT EXTRACTION W/PHACO Right 07/08/2016   Procedure: CATARACT EXTRACTION PHACO AND INTRAOCULAR LENS PLACEMENT RIGHT EYE;  Surgeon: Tonny , MD;  Location: [facility];  Service: Ophthalmology;  Laterality: Right;  CDE: 9.01  . COLONOSCOPY  2011   [facility]: normal colon, normal distal ileum  . COLONOSCOPY WITH PROPOFOL N/A 07/21/2014   Procedure: ATTEMPTED COLONOSCOPY WITH PROPOFOL-HAD TO STOP DUE TO BRADYCARDIA;  Surgeon: Daneil Dolin, MD;  Location: [facility];  Service: Endoscopy;  Laterality: N/A;  . EGD with enteroscopy  2011   [facility]: normal esophagus and stomach. Normal duodenum, jejunum. No evidence of AVMs.   . ESOPHAGOGASTRODUODENOSCOPY (EGD) WITH PROPOFOL N/A 07/21/2014   Procedure: ESOPHAGOGASTRODUODENOSCOPY (EGD) WITH PROPOFOL;  Surgeon: Daneil Dolin, MD;  Location: [facility];  Service: Endoscopy;  Laterality: N/A;  . HERNIA REPAIR    . KNEE ARTHROSCOPY WITH MEDIAL MENISECTOMY Left  11/06/2012   Procedure: KNEE ARTHROSCOPY WITH MEDIAL MENISECTOMY;  Surgeon: Carole Civil, MD;  Location: [facility];  Service: Orthopedics;  Laterality: Left;  . Lipoma removal     Stomach  . LUMBAR LAMINECTOMY/DECOMPRESSION MICRODISCECTOMY Left 08/30/2013   Procedure: LUMBAR LAMINECTOMY/DECOMPRESSION MICRODISCECTOMY LEFT  LUMBAR TWO THREE;  Surgeon: Otilio Connors, MD;  Location: [facility];  Service: Neurosurgery;  Laterality: Left;  . SHOULDER SURGERY     Rght-rotator cuff     No Known Allergies    Family History  Problem Relation Age of Onset  . Heart disease Mother   . Hyperlipidemia Mother   . Hypertension Mother   . Depression Mother   . Diabetes Mother   . Rectal  cancer Mother   . Cancer Mother   . Heart disease Father   . Hypertension Father   . Hyperlipidemia Father   . Diabetes Father   . Cancer Father   . Heart disease Sister   . Hyperlipidemia Sister   . Hypertension Sister   . Diabetes Sister   . Diabetes Brother   . Heart disease Sister   . Hyperlipidemia Sister   . Hypertension Sister   . Heart disease Sister   . Hyperlipidemia Sister   . Hypertension Sister   . Diabetes Sister   . Diabetes Brother      Social History Michael [name] reports that he quit smoking about 16 years ago. His smoking use included Cigarettes. He started smoking about 61 years ago. He has a 110.00 pack-year smoking history. He has never used smokeless tobacco. Michael [name] reports that he does not drink alcohol.   Review of Systems CONSTITUTIONAL: No weight loss, fever, chills, weakness or fatigue.  HEENT: Eyes: No visual loss, blurred vision, double vision or yellow sclerae.No hearing loss, sneezing, congestion, runny nose or sore throat.  SKIN: No rash or itching.  CARDIOVASCULAR: per hpi RESPIRATORY: No shortness of breath, cough or sputum.  GASTROINTESTINAL: No anorexia, nausea, vomiting or diarrhea. No abdominal pain or blood.  GENITOURINARY: No burning on urination,  no polyuria NEUROLOGICAL: No headache, dizziness, syncope, paralysis, ataxia, numbness or tingling in the extremities. No change in bowel or bladder control.  MUSCULOSKELETAL: No muscle, back pain, joint pain or stiffness.  LYMPHATICS: No enlarged nodes. No history of splenectomy.  PSYCHIATRIC: No history of depression or anxiety.  ENDOCRINOLOGIC: No reports of sweating, cold or heat intolerance. No polyuria or polydipsia.  Marland Kitchen   Physical Examination Vitals:   07/18/16 1036  BP: 107/68  Pulse: 61   Vitals:   07/18/16 1036  Weight: 191 lb 12.8 oz (87 kg)  Height: 4' 11"  (1.499 m)    Gen: resting comfortably, no acute distress HEENT: no scleral icterus, pupils equal round and reactive, no palptable cervical adenopathy,  CV: RRR, no m/r/g, no jvd Resp: Clear to auscultation bilaterally GI: abdomen is soft, non-tender, non-distended, normal bowel sounds, no hepatosplenomegaly MSK: extremities are warm, 1+ bilateral edema Skin: warm, no rash Neuro:  no focal deficits Psych: appropriate affect   Diagnostic Studies 05/2015 echo Study Conclusions  - Left ventricle: The cavity size was normal. Wall thickness was increased in a pattern of mild LVH. Systolic function was normal. The estimated ejection fraction was in the range of 60% to 65%. Wall motion was normal; there were no regional wall motion abnormalities. Features are consistent with a pseudonormal left ventricular filling pattern, with concomitant abnormal relaxation and increased filling pressure (grade 2 diastolic dysfunction). Doppler parameters are consistent with high ventricular filling pressure. - Aortic valve: Mildly calcified annulus. - Mitral valve: Mildly calcified annulus. There was trivial regurgitation. - Left atrium: The atrium was mildly to moderately dilated. Volume/bsa, S: 34.4 ml/m^2. Volume/bsa, ES (1-plane Simpson&'s, A4C): 31.3 ml/m^2.  Jan 2017 Study Conclusions  - Left  ventricle: The cavity size was mildly dilated. Wall thickness was increased in a pattern of mild LVH. Systolic function was normal. The estimated ejection fraction was in the range of 50% to 55%. Wall motion was normal; there were no regional wall motion abnormalities. Doppler parameters are consistent with abnormal left ventricular relaxation (grade 1 diastolic dysfunction). - Mitral valve: Calcified annulus. Mildly thickened leaflets . - Atrial septum: No defect or patent foramen ovale was identified.  Assessment and Plan   1. LE edema/Chronic diastolic HF - likely combination of lymphedema and acute on chronic diastolic HF - currently on bumex 34m bid and metolazone 2.584mdaily.  - doing well from welling standpoint, continue current meds - repeat labs BMET/Mg in 1 month      JoArnoldo LenisM.D.

## 2016-07-18 NOTE — Patient Instructions (Signed)
Your physician recommends that you schedule a follow-up appointment in: 3 MONTHS WITH DR. BRANCH IN [facility] OFFICE  Your physician recommends that you continue on your current medications as directed. Please refer to the Current Medication list given to you today.  Your physician recommends that you return for lab work in: 1 MONTH BMP/MG  Thank you for choosing [facility]!!

## 2016-07-19 ENCOUNTER — Ambulatory Visit: Payer: Medicare Other | Admitting: Family Medicine

## 2016-07-19 ENCOUNTER — Other Ambulatory Visit: Payer: Self-pay | Admitting: Family Medicine

## 2016-07-19 MED ORDER — TETRACAINE HCL 0.5 % OP SOLN
OPHTHALMIC | Status: AC
  Filled 2016-07-19: qty 4

## 2016-07-19 MED ORDER — LIDOCAINE HCL (PF) 1 % IJ SOLN
INTRAMUSCULAR | Status: AC
  Filled 2016-07-19: qty 2

## 2016-07-19 MED ORDER — CYCLOPENTOLATE-PHENYLEPHRINE 0.2-1 % OP SOLN
OPHTHALMIC | Status: AC
  Filled 2016-07-19: qty 2

## 2016-07-19 MED ORDER — LIDOCAINE HCL 3.5 % OP GEL
OPHTHALMIC | Status: AC
  Filled 2016-07-19: qty 1

## 2016-07-19 MED ORDER — PHENYLEPHRINE HCL 2.5 % OP SOLN
OPHTHALMIC | Status: AC
  Filled 2016-07-19: qty 15

## 2016-07-19 MED ORDER — NEOMYCIN-POLYMYXIN-DEXAMETH 3.5-10000-0.1 OP SUSP
OPHTHALMIC | Status: AC
  Filled 2016-07-19: qty 5

## 2016-07-22 ENCOUNTER — Ambulatory Visit (HOSPITAL_COMMUNITY): Payer: Medicare Other | Admitting: Anesthesiology

## 2016-07-22 ENCOUNTER — Ambulatory Visit (HOSPITAL_COMMUNITY)
Admission: RE | Admit: 2016-07-22 | Discharge: 2016-07-22 | Disposition: A | Discharge status: Home / Self Care | Payer: Medicare Other | Source: Ambulatory Visit | Attending: Ophthalmology | Admitting: Ophthalmology

## 2016-07-22 ENCOUNTER — Encounter (HOSPITAL_COMMUNITY): Payer: Self-pay | Admitting: *Deleted

## 2016-07-22 ENCOUNTER — Encounter (HOSPITAL_COMMUNITY)
Admission: RE | Disposition: A | Discharge status: Home / Self Care | Payer: Self-pay | Source: Ambulatory Visit | Attending: Ophthalmology

## 2016-07-22 DIAGNOSIS — K219 Gastro-esophageal reflux disease without esophagitis: Secondary | ICD-10-CM | POA: Insufficient documentation

## 2016-07-22 DIAGNOSIS — K7581 Nonalcoholic steatohepatitis (NASH): Secondary | ICD-10-CM | POA: Diagnosis not present

## 2016-07-22 DIAGNOSIS — Z87891 Personal history of nicotine dependence: Secondary | ICD-10-CM | POA: Insufficient documentation

## 2016-07-22 DIAGNOSIS — Z79899 Other long term (current) drug therapy: Secondary | ICD-10-CM | POA: Insufficient documentation

## 2016-07-22 DIAGNOSIS — F329 Major depressive disorder, single episode, unspecified: Secondary | ICD-10-CM | POA: Insufficient documentation

## 2016-07-22 DIAGNOSIS — Z7984 Long term (current) use of oral hypoglycemic drugs: Secondary | ICD-10-CM | POA: Diagnosis not present

## 2016-07-22 DIAGNOSIS — D649 Anemia, unspecified: Secondary | ICD-10-CM | POA: Diagnosis not present

## 2016-07-22 DIAGNOSIS — E119 Type 2 diabetes mellitus without complications: Secondary | ICD-10-CM | POA: Insufficient documentation

## 2016-07-22 DIAGNOSIS — I509 Heart failure, unspecified: Secondary | ICD-10-CM | POA: Diagnosis not present

## 2016-07-22 DIAGNOSIS — H2512 Age-related nuclear cataract, left eye: Secondary | ICD-10-CM | POA: Diagnosis not present

## 2016-07-22 DIAGNOSIS — J449 Chronic obstructive pulmonary disease, unspecified: Secondary | ICD-10-CM | POA: Insufficient documentation

## 2016-07-22 DIAGNOSIS — I110 Hypertensive heart disease with heart failure: Secondary | ICD-10-CM | POA: Insufficient documentation

## 2016-07-22 HISTORY — PX: CATARACT EXTRACTION W/PHACO: SHX586

## 2016-07-22 LAB — GLUCOSE, CAPILLARY: GLUCOSE-CAPILLARY: 98 mg/dL (ref 65–99)

## 2016-07-22 SURGERY — PHACOEMULSIFICATION, CATARACT, WITH IOL INSERTION
Anesthesia: Monitor Anesthesia Care | Site: Eye | Laterality: Left

## 2016-07-22 MED ORDER — LIDOCAINE HCL 3.5 % OP GEL
1.0000 "application " | Freq: Once | OPHTHALMIC | Status: AC
  Administered 2016-07-22: 1 via OPHTHALMIC

## 2016-07-22 MED ORDER — CYCLOPENTOLATE-PHENYLEPHRINE 0.2-1 % OP SOLN
1.0000 [drp] | OPHTHALMIC | Status: AC
  Administered 2016-07-22 (×3): 1 [drp] via OPHTHALMIC

## 2016-07-22 MED ORDER — LACTATED RINGERS IV SOLN
INTRAVENOUS | Status: DC
  Administered 2016-07-22: 10:00:00 via INTRAVENOUS

## 2016-07-22 MED ORDER — PHENYLEPHRINE HCL 2.5 % OP SOLN
1.0000 [drp] | OPHTHALMIC | Status: AC
  Administered 2016-07-22 (×3): 1 [drp] via OPHTHALMIC

## 2016-07-22 MED ORDER — BSS IO SOLN
INTRAOCULAR | Status: DC | PRN
  Administered 2016-07-22: 15 mL

## 2016-07-22 MED ORDER — NEOMYCIN-POLYMYXIN-DEXAMETH 3.5-10000-0.1 OP SUSP
OPHTHALMIC | Status: DC | PRN
  Administered 2016-07-22: 2 [drp] via OPHTHALMIC

## 2016-07-22 MED ORDER — POVIDONE-IODINE 5 % OP SOLN
OPHTHALMIC | Status: DC | PRN
  Administered 2016-07-22: 1 via OPHTHALMIC

## 2016-07-22 MED ORDER — MIDAZOLAM HCL 2 MG/2ML IJ SOLN
INTRAMUSCULAR | Status: AC
  Filled 2016-07-22: qty 2

## 2016-07-22 MED ORDER — PROVISC 10 MG/ML IO SOLN
INTRAOCULAR | Status: DC | PRN
  Administered 2016-07-22: 0.85 mL via INTRAOCULAR

## 2016-07-22 MED ORDER — EPINEPHRINE PF 1 MG/ML IJ SOLN
INTRAOCULAR | Status: DC | PRN
  Administered 2016-07-22: 500 mL

## 2016-07-22 MED ORDER — FENTANYL CITRATE (PF) 100 MCG/2ML IJ SOLN
INTRAMUSCULAR | Status: AC
  Filled 2016-07-22: qty 2

## 2016-07-22 MED ORDER — LIDOCAINE HCL (PF) 1 % IJ SOLN
INTRAMUSCULAR | Status: DC | PRN
  Administered 2016-07-22: .4 mL

## 2016-07-22 MED ORDER — EPINEPHRINE PF 1 MG/ML IJ SOLN
INTRAMUSCULAR | Status: AC
  Filled 2016-07-22: qty 1

## 2016-07-22 MED ORDER — MIDAZOLAM HCL 2 MG/2ML IJ SOLN
1.0000 mg | INTRAMUSCULAR | Status: DC | PRN
  Administered 2016-07-22: 2 mg via INTRAVENOUS

## 2016-07-22 MED ORDER — FENTANYL CITRATE (PF) 100 MCG/2ML IJ SOLN
25.0000 ug | INTRAMUSCULAR | Status: AC | PRN
  Administered 2016-07-22 (×2): 25 ug via INTRAVENOUS

## 2016-07-22 MED ORDER — TETRACAINE HCL 0.5 % OP SOLN
1.0000 [drp] | OPHTHALMIC | Status: AC
  Administered 2016-07-22 (×3): 1 [drp] via OPHTHALMIC

## 2016-07-22 SURGICAL SUPPLY — 11 items
CLOTH BEACON ORANGE TIMEOUT ST (SAFETY) ×2 IMPLANT
EYE SHIELD UNIVERSAL CLEAR (GAUZE/BANDAGES/DRESSINGS) ×2 IMPLANT
GLOVE BIOGEL PI IND STRL 6.5 (GLOVE) IMPLANT
GLOVE BIOGEL PI INDICATOR 6.5 (GLOVE) ×2
GLOVE EXAM NITRILE MD LF STRL (GLOVE) ×2 IMPLANT
PAD ARMBOARD 7.5X6 YLW CONV (MISCELLANEOUS) ×2 IMPLANT
SIGHTPATH CAT PROC W REG LENS (Ophthalmic Related) ×3 IMPLANT
SYRINGE LUER LOK 1CC (MISCELLANEOUS) ×2 IMPLANT
TAPE SURG TRANSPORE 1 IN (GAUZE/BANDAGES/DRESSINGS) IMPLANT
TAPE SURGICAL TRANSPORE 1 IN (GAUZE/BANDAGES/DRESSINGS) ×2
WATER STERILE IRR 250ML POUR (IV SOLUTION) ×2 IMPLANT

## 2016-07-22 NOTE — Transfer of Care (Signed)
Immediate Anesthesia Transfer of Care Note  Patient: Michael [name]  Procedure(s) Performed: Procedure(s) with comments: CATARACT EXTRACTION PHACO AND INTRAOCULAR LENS PLACEMENT LEFT EYE CDE=10.69 (Left) - left -   Patient Location: Short Stay  Anesthesia Type:MAC  Level of Consciousness: awake, alert , oriented and patient cooperative  Airway & Oxygen Therapy: Patient Spontanous Breathing  Post-op Assessment: Report given to RN and Post -op Vital signs reviewed and stable  Post vital signs: Reviewed and stable  Last Vitals:  Vitals:   07/22/16 1030 07/22/16 1035  BP: (!) 86/45 (!) 90/49  Pulse:    Resp: 15 16  Temp:      Last Pain:  Vitals:   07/22/16 0942  TempSrc: Oral  PainSc: 3       Patients Stated Pain Goal: 8 (83/16/74 2552)  Complications: No apparent anesthesia complications

## 2016-07-22 NOTE — H&P (Signed)
I have reviewed the H&P, the patient was re-examined, and I have identified no interval changes in medical condition and plan of care since the history and physical of record  

## 2016-07-22 NOTE — Op Note (Signed)
Date of Admission: 07/22/2016  Date of Surgery: 07/22/2016   Pre-Op Dx: Cataract Left Eye  Post-Op Dx: Senile Nuclear Cataract Left  Eye,  Dx Code H25.12  Surgeon: Tonny Branch, M.D.  Assistants: None  Anesthesia: Topical with MAC  Indications: Painless, progressive loss of vision with compromise of daily activities.  Surgery: Cataract Extraction with Intraocular lens Implant Left Eye  Discription: The patient had dilating drops and viscous lidocaine placed into the Left eye in the pre-op holding area. After transfer to the operating room, a time out was performed. The patient was then prepped and draped. Beginning with a 39 degree blade a paracentesis port was made at the surgeon's 2 o'clock position. The anterior chamber was then filled with 1% non-preserved lidocaine. This was followed by filling the anterior chamber with Provisc.  A 2.87m keratome blade was used to make a clear corneal incision at the temporal limbus.  A bent cystatome needle was used to create a continuous tear capsulotomy. Hydrodissection was performed with balanced salt solution on a Fine canula. The lens nucleus was then removed using the phacoemulsification handpiece. Residual cortex was removed with the I&A handpiece. The anterior chamber and capsular bag were refilled with Provisc. A posterior chamber intraocular lens was placed into the capsular bag with it's injector. The implant was positioned with the Kuglan hook. The Provisc was then removed from the anterior chamber and capsular bag with the I&A handpiece. Stromal hydration of the main incision and paracentesis port was performed with BSS on a Fine canula. The wounds were tested for leak which was negative. The patient tolerated the procedure well. There were no operative complications. The patient was then transferred to the recovery room in stable condition.  Complications: None  Specimen: None  EBL: None  Prosthetic device: Hoya iSert 250, power 15.0 D, SN  [id number]

## 2016-07-22 NOTE — Anesthesia Preprocedure Evaluation (Signed)
Anesthesia Evaluation  Patient identified by MRN, date of birth, ID band Patient awake    Reviewed: Allergy & Precautions, H&P , NPO status , Patient's Chart, lab work & pertinent test results  Airway Mallampati: II  TM Distance: >3 FB     Dental  (+) Teeth Intact, Poor Dentition,    Pulmonary COPD, former smoker,    breath sounds clear to auscultation       Cardiovascular hypertension, Pt. on medications +CHF   Rhythm:Regular Rate:Normal     Neuro/Psych PSYCHIATRIC DISORDERS Depression  Neuromuscular disease    GI/Hepatic GERD  Medicated and Controlled,(+) Hepatitis - (NASH)  Endo/Other  diabetes, Type 2, Oral Hypoglycemic AgentsHypothyroidism   Renal/GU      Musculoskeletal   Abdominal   Peds  Hematology  (+) anemia ,   Anesthesia Other Findings   Reproductive/Obstetrics                             Anesthesia Physical Anesthesia Plan  ASA: III  Anesthesia Plan: MAC   Post-op Pain Management:    Induction: Intravenous  Airway Management Planned: Nasal Cannula  Additional Equipment:   Intra-op Plan:   Post-operative Plan:   Informed Consent: I have reviewed the patients History and Physical, chart, labs and discussed the procedure including the risks, benefits and alternatives for the proposed anesthesia with the patient or authorized representative who has indicated his/her understanding and acceptance.     Plan Discussed with:   Anesthesia Plan Comments:         Anesthesia Quick Evaluation

## 2016-07-22 NOTE — Anesthesia Postprocedure Evaluation (Signed)
Anesthesia Post Note  Patient: Michael [name]  Procedure(s) Performed: Procedure(s) (LRB): CATARACT EXTRACTION PHACO AND INTRAOCULAR LENS PLACEMENT LEFT EYE CDE=10.69 (Left)  Patient location during evaluation: Short Stay Anesthesia Type: MAC Level of consciousness: awake and alert and oriented Pain management: pain level controlled Vital Signs Assessment: post-procedure vital signs reviewed and stable Respiratory status: spontaneous breathing Cardiovascular status: stable Postop Assessment: no signs of nausea or vomiting Anesthetic complications: no    Last Vitals:  Vitals:   07/22/16 1030 07/22/16 1035  BP: (!) 86/45 (!) 90/49  Pulse:    Resp: 15 16  Temp:      Last Pain:  Vitals:   07/22/16 0942  TempSrc: Oral  PainSc: 3                  ADAMS, AMY A

## 2016-07-22 NOTE — Anesthesia Procedure Notes (Signed)
Procedure Name: MAC Date/Time: 07/22/2016 10:45 AM Performed by: Andree Elk, Remie Mathison A Pre-anesthesia Checklist: Patient identified, Timeout performed, Emergency Drugs available, Suction available and Patient being monitored Oxygen Delivery Method: Nasal cannula

## 2016-07-23 ENCOUNTER — Other Ambulatory Visit: Payer: Self-pay | Admitting: *Deleted

## 2016-07-23 ENCOUNTER — Other Ambulatory Visit: Payer: Self-pay | Admitting: Family Medicine

## 2016-07-23 ENCOUNTER — Telehealth: Payer: Self-pay | Admitting: *Deleted

## 2016-07-23 ENCOUNTER — Encounter: Payer: Self-pay | Admitting: *Deleted

## 2016-07-23 ENCOUNTER — Encounter (HOSPITAL_COMMUNITY): Payer: Self-pay | Admitting: Ophthalmology

## 2016-07-23 NOTE — Patient Outreach (Signed)
[facility]) Care Management  07/23/2016  KERIC ZEHREN 1946-05-29 [id number]  Call placed to Mr. Egolf today to follow up on his cataract surgery yesterday. I was unable reach him but left a HIPPA complaint message.   Plan: I will follow up with Mr. Alldredge no later than next week.    [facility]  [phone]

## 2016-07-23 NOTE — Telephone Encounter (Signed)
Received call from patient.   States that he requires letter from PCP for [facility] listing medications.   MD made aware and approved letter.   Letter dictated and pt made aware to pick up on 07/24/2016.

## 2016-07-28 ENCOUNTER — Other Ambulatory Visit: Payer: Self-pay | Admitting: Family Medicine

## 2016-07-28 ENCOUNTER — Other Ambulatory Visit: Payer: Self-pay | Admitting: Cardiology

## 2016-07-29 ENCOUNTER — Encounter: Payer: Self-pay | Admitting: *Deleted

## 2016-07-29 ENCOUNTER — Other Ambulatory Visit: Payer: Self-pay | Admitting: *Deleted

## 2016-07-29 NOTE — Patient Outreach (Signed)
[facility]) Care Management  07/29/2016  Michael [name] 01-26-1946 [id number]  Michael [name] an 70 y.o.malewith chronic diastolic heart failure with EF 60-65%, history of bradycardia, type II Diabetes, Hypertension, Iron Deficiency Anemia, NASH + Hepatomegaly, and degeneratove disc disease s/p surgery of L2-L3.   Michael [name] was admitted to the hospital in February with Acute/Chronic Diastolic Heart Failure and worsening lymphedema. Thereafter, he spent 21 days at [facility]. Michael [name] was referred to [place] [facility] for assistance with management of CHF.   [facility] Care Management has been following Michael [name] in the community since his discharge from SNF. Focus of care management services has been around CHF and DM Disease management with particular attention to volume management and peripheral edema/lymphedema and lower extremity skin ailments.   Chronic Health Condition (Lymphedema) - Michael [name] attended the lymphedema treatment program at [place] he is very pleased with the improvement in his lower extremity swelling and skin condition. He is now participating in outpatient PT for strengthening,conditioning, and ambulation. He denies any further problems with leg swelling since completion of the program. He says he is better able to participate in physical therapy since his legs are no longer so edematous.    Chronic Health Condition (Skin) - Michael [name] says his chronic skin conditions (sacral/scrotum) have resolved. His home health visits are complete.    Post Op Cataract Surgery (bilateral) - Michael [name] has now had (2 separate occasions) bilateral cataract surgery. He had no surgical complications and says he is "thrilled" with his results.   Chronic Health Condition (CHF) -  Michael [name] denies any signs or symptoms of worsening CHF. His weight today is 190#. He denies shortness of breath, edema, chest pain, lightheadedness,  dizziness, nausea, or decreased urine output.    Chronic Health Condition (DM) - Michael [name] cbg's have improved over the last few months; most recent HgA1C = 6.7 (04/10/16); he is taking oral medications and insulin as prescribed; Michael [name] is participating in out patient physical therapy and therefore exercising at least 3 days/week.   Plan: I will see Michael [name] at home next week for a routine home visit. If stable, I will likely transition him to our telephonic program.    [place] Management  [phone]

## 2016-07-30 ENCOUNTER — Encounter: Payer: Self-pay | Admitting: Family Medicine

## 2016-07-30 ENCOUNTER — Ambulatory Visit (INDEPENDENT_AMBULATORY_CARE_PROVIDER_SITE_OTHER): Payer: Medicare Other | Admitting: Family Medicine

## 2016-07-30 VITALS — BP 124/62 | HR 82 | Temp 98.2°F | Resp 18

## 2016-07-30 DIAGNOSIS — Z23 Encounter for immunization: Secondary | ICD-10-CM | POA: Diagnosis not present

## 2016-07-30 DIAGNOSIS — I890 Lymphedema, not elsewhere classified: Secondary | ICD-10-CM | POA: Diagnosis not present

## 2016-07-30 DIAGNOSIS — R531 Weakness: Secondary | ICD-10-CM

## 2016-07-30 DIAGNOSIS — G622 Polyneuropathy due to other toxic agents: Secondary | ICD-10-CM

## 2016-07-30 DIAGNOSIS — E038 Other specified hypothyroidism: Secondary | ICD-10-CM

## 2016-07-30 DIAGNOSIS — I10 Essential (primary) hypertension: Secondary | ICD-10-CM

## 2016-07-30 DIAGNOSIS — E1149 Type 2 diabetes mellitus with other diabetic neurological complication: Secondary | ICD-10-CM

## 2016-07-30 DIAGNOSIS — M48061 Spinal stenosis, lumbar region without neurogenic claudication: Secondary | ICD-10-CM | POA: Diagnosis not present

## 2016-07-30 MED ORDER — OXYCODONE-ACETAMINOPHEN 7.5-325 MG PO TABS: ORAL_TABLET | ORAL | 0 refills | Status: DC

## 2016-07-30 NOTE — Patient Instructions (Addendum)
Continue current medications Flu shot given Pain medicine reflled for Nov  A1C to be done with house labs  F/U 4 months

## 2016-07-30 NOTE — Progress Notes (Signed)
   Subjective:    Patient ID: Michael [name], male    DOB: [birth year], 70 y.o.   MRN: [id number]  Patient presents for 4 month F/U (is not fasting) and Trapeze Bar (needs documentation to support use of trapeze bar with bed)  Patient follow-up chronic medical problems. He is followed by cardiology  Diabetes mellitus- he is taking his medications as prescribed his last A1c was 6.7%, LDL was at goal at 55His blood sugars have been running good they have been less than 120 fasting this morning 123 he is on 30 units of Lantus at bedtime He is going to reestablish with the [facility] to help with his medications and care's meds will come from them to also establish with a primary care provider there he will see if he can continue to be outsourced to the community since he has difficulty with traveling due to his condition.  A recent thyroid studies which were normal   He's been evaluated by physical therapy secondary to his spinal stenosis and lumbar region, peripheral neuropathy, lymphedema gait instability and weakness he requires trapeze bar to help with body positioning in the bed as well as assistance to get out of his bed into a wheelchair.   Review Of Systems:  GEN- denies fatigue, fever, weight loss,weakness, recent illness HEENT- denies eye drainage, change in vision, nasal discharge, CVS- denies chest pain, palpitations RESP- denies SOB, cough, wheeze ABD- denies N/V, change in stools, abd pain GU- denies dysuria, hematuria, dribbling, incontinence MSK- +joint pain, muscle aches, injury Neuro- denies headache, dizziness, syncope, seizure activity       Objective:    BP 124/62 (BP Location: Left Arm, Patient Position: Sitting, Cuff Size: Large)   Pulse 82   Temp 98.2 F (36.8 C) (Oral)   Resp 18  GEN- NAD, alert and oriented x3 HEENT- PERRL, EOMI, non injected sclera, pink conjunctiva, MMM, oropharynx clear Neck- Supple, no thyromegaly CVS- RRR, no  murmur RESP-CTAB ABD-NABS,soft,NT,ND EXT- chronic edema 1+ bilat with ballooning over feet  Pulses- Radial, DP- 2+        Assessment & Plan:      Problem List Items Addressed This Visit    Type II diabetes mellitus with neurological manifestations ([place]) - Primary    Will plan for A1c to be done with his in-home lab draws for cardiology he will continue his insulin at the current dose as well as his metformin for now      Spinal stenosis of lumbar region    Continued on pain prescription, refilled today for his November dose      Peripheral neuropathy (HCC)   OBESITY, MORBID   Lymphedema    Continues per cardiology for his diuretics elevation of legs compression hose      Generalized weakness   Essential hypertension    Blood pressure is controlled and changed her medication       Other Visit Diagnoses    Need for prophylactic vaccination and inoculation against influenza       Relevant Orders   Flu Vaccine QUAD 36+ mos PF IM (Fluarix & Fluzone Quad PF) (Completed)      Note: This dictation was prepared with Dragon dictation along with smaller phrase technology. Any transcriptional errors that result from this process are unintentional.

## 2016-08-01 ENCOUNTER — Encounter: Payer: Self-pay | Admitting: Family Medicine

## 2016-08-01 DIAGNOSIS — R531 Weakness: Secondary | ICD-10-CM | POA: Insufficient documentation

## 2016-08-01 NOTE — Assessment & Plan Note (Signed)
Continued on pain prescription, refilled today for his November dose

## 2016-08-01 NOTE — Assessment & Plan Note (Signed)
Continues per cardiology for his diuretics elevation of legs compression hose

## 2016-08-01 NOTE — Assessment & Plan Note (Signed)
Will plan for A1c to be done with his in-home lab draws for cardiology he will continue his insulin at the current dose as well as his metformin for now

## 2016-08-01 NOTE — Assessment & Plan Note (Signed)
Had repeat levels in hospital at goal

## 2016-08-01 NOTE — Assessment & Plan Note (Signed)
Blood pressure is controlled and changed her medication

## 2016-08-02 DIAGNOSIS — R609 Edema, unspecified: Secondary | ICD-10-CM | POA: Diagnosis not present

## 2016-08-02 DIAGNOSIS — I890 Lymphedema, not elsewhere classified: Secondary | ICD-10-CM | POA: Diagnosis not present

## 2016-08-04 ENCOUNTER — Other Ambulatory Visit: Payer: Self-pay | Admitting: Family Medicine

## 2016-08-05 NOTE — Telephone Encounter (Signed)
Medication refilled per protocol. 

## 2016-08-07 ENCOUNTER — Other Ambulatory Visit: Payer: Self-pay | Admitting: Family Medicine

## 2016-08-07 ENCOUNTER — Other Ambulatory Visit: Payer: Self-pay | Admitting: *Deleted

## 2016-08-07 DIAGNOSIS — E1149 Type 2 diabetes mellitus with other diabetic neurological complication: Secondary | ICD-10-CM

## 2016-08-07 NOTE — Progress Notes (Signed)
Needs A1C

## 2016-08-07 NOTE — Patient Outreach (Signed)
[facility] [facility]) Care Management  08/07/2016  Michael [name] [birth year] [id number]  I spoke briefly today with Michael [name] after reviewing his primary care and cardiology office visit notes. Orders are present for needed labs and it is time for me to see Michael [name] for a routine home visit for review of chronic disease management and care coordination.   Plan: I will see Michael [name] at home tomorrow at 12:30pm for a face to face visit.    [place] Management  [phone]

## 2016-08-08 ENCOUNTER — Other Ambulatory Visit: Payer: Self-pay | Admitting: *Deleted

## 2016-08-08 ENCOUNTER — Encounter: Payer: Self-pay | Admitting: *Deleted

## 2016-08-08 DIAGNOSIS — I10 Essential (primary) hypertension: Secondary | ICD-10-CM | POA: Diagnosis not present

## 2016-08-08 NOTE — Patient Outreach (Addendum)
[facility]) Care Management   08/08/2016  Michael [name] 1946-09-21 [id number]  Michael [name] is an 70 y.o. male with chronic diastolic heart failure with EF 60-65%, history of bradycardia, type II Diabetes, Hypertension, Iron Deficiency Anemia, NASH + Hepatomegaly, and degeneratove disc disease s/p surgery of L2-L3.   Michael [name] was admitted to the hospital in February with Acute/Chronic Diastolic Heart Failure and worsening lymphedema. Thereafter, he spent 21 days at [facility]. Michael [name] was referred to [place] Management for assistance with management of CHF.   [facility] Care Management has been following Michael [name] in the community since his discharge from SNF. Focus of care management services has been around CHF and DM Disease management with particular attention to volume management and peripheral edema/lymphedema and lower extremity skin ailments.   Subjective: "I just can't believe how good I'm doing!'  Objective:  BP 118/72   Pulse 61   SpO2 98%   Review of Systems  Constitutional: Negative.   HENT: Negative.   Eyes: Negative.   Respiratory: Negative.   Cardiovascular: Positive for leg swelling.       2+ bilateral edema lower extremities mid calves to pre-tibial area  Gastrointestinal: Negative.   Genitourinary: Negative.   Musculoskeletal: Negative for falls.       Mild but improving contractures of bilateral knee joints and hips  Skin: Negative.   Neurological: Negative.   Psychiatric/Behavioral: Negative.     Physical Exam  Constitutional: He is oriented to person, place, and time. Vital signs are normal. He appears well-developed and well-nourished. He is active.  Non-toxic appearance. He does not have a sickly appearance. He does not appear ill.  Cardiovascular: Normal rate and regular rhythm.   Respiratory: Effort normal. He has decreased breath sounds. He has no wheezes. He has no rhonchi. He has no rales.  Mildly diminished breath sounds  throughought  GI: Soft. Bowel sounds are normal.  Musculoskeletal: He exhibits deformity.  Mild but improving contractures bilateral knees and hips  Neurological: He is alert and oriented to person, place, and time.  Skin: Skin is warm, dry and intact.  Psychiatric: He has a normal mood and affect. His speech is normal and behavior is normal. Judgment and thought content normal.   Encounter Medications:   Outpatient Encounter Prescriptions as of 08/08/2016  Medication Sig Note  . ACCU-CHEK AVIVA PLUS test strip USE AS DIRECTED TWICE DAILY.   Marland Kitchen aspirin EC 81 MG tablet Take 81 mg by mouth daily.   . B-D ULTRAFINE III SHORT PEN 31G X 8 MM MISC USE AS DIRECTED DAILY WITH LANTUS.   Marland Kitchen benazepril (LOTENSIN) 40 MG tablet TAKE ONE TABLET BY MOUTH DAILY.   . BESIVANCE 0.6 % SUSP Place 1 drop into both eyes as directed. Completed therapy for left eye 07/15/16  To begin therapy for right eye on 07/19/16 (Three times daily) 07/16/2016: Completed therapy for left eye 07/15/16  To begin therapy for right eye on 07/19/16 (Three times daily)   . bumetanide (BUMEX) 2 MG tablet TAKE 1&1/2 TABLETS BY MOUTH TWICE DAILY. (Patient taking differently: TAKE 2 MG (1 TABLET) TABLETS BY MOUTH TWICE DAILY.)   . BYSTOLIC 10 MG tablet TAKE ONE TABLET BY MOUTH DAILY.   . diazepam (VALIUM) 5 MG tablet TAKE 1 TABLET BY MOUTH AT BEDTIME AS NEEDED FOR ANXIETY.   . diclofenac sodium (VOLTAREN) 1 % GEL Apply 4 g topically 4 (four) times daily. (Patient taking differently: Apply 4 g topically 4 (four)  times daily as needed (pain). )   . DUREZOL 0.05 % EMUL Place 1 drop into both eyes 3 (three) times daily. Patient to continue therapy in LEFT EYE until 08/05/16. Patient to begin therapy course for RIGHT EYE on 07/22/16 following surgery and to continue for 3 weeks 07/16/2016: Patient to continue therapy in LEFT EYE until 08/05/16. Patient to begin therapy course for RIGHT EYE on 07/22/16 following surgery and to continue for 3 weeks   .  Ferrous Sulfate (IRON) 325 (65 FE) MG TABS Take 1 tablet by mouth daily.    Marland Kitchen gabapentin (NEURONTIN) 400 MG capsule TAKE ONE CAPSULE BY MOUTH THREE TIMES DAILY.   Marland Kitchen ibuprofen (ADVIL,MOTRIN) 800 MG tablet Take 800 mg by mouth 3 (three) times daily as needed. For pain   . LANTUS SOLOSTAR 100 UNIT/ML Solostar Pen INJECT 30 UNITS SUBCUTANEOUSLY AT BEDTIME.   Marland Kitchen levothyroxine (SYNTHROID, LEVOTHROID) 88 MCG tablet TAKE 1 TABLET BY MOUTH DAILY BEFORE BREAKFAST.   . meloxicam (MOBIC) 15 MG tablet TAKE ONE TABLET BY MOUTH DAILY WITH FOOD.   Marland Kitchen metFORMIN (GLUCOPHAGE) 1000 MG tablet TAKE ONE TABLET BY MOUTH TWICE DAILY WITH A MEAL.   Marland Kitchen metolazone (ZAROXOLYN) 2.5 MG tablet TAKE 1 TABLET BY MOUTH DAILY.   Marland Kitchen nystatin cream (MYCOSTATIN) APPLY TO AFFECTED AREA TWICE DAILY.   Marland Kitchen omeprazole (PRILOSEC) 20 MG capsule TAKE ONE CAPSULE BY MOUTH TWICE DAILY.   Marland Kitchen oxyCODONE-acetaminophen (PERCOCET) 7.5-325 MG tablet Take one tablet by mouth every 6 hours as needed for severe pain. Max APAP 3gm/24hrs from all sources   . pravastatin (PRAVACHOL) 40 MG tablet TAKE ONE TABLET BY MOUTH DAILY. (Patient taking differently: TAKE ONE TABLET BY MOUTH DAILY IN THE EVENING)   . PROLENSA 0.07 % SOLN Place 1 drop into both eyes as directed. To complete course on left 08/05/16 and to begin therapy for right eye on 07/19/16 (once daily) 07/16/2016: To complete course on left 08/05/16 and to begin therapy for right eye on 07/19/16 (once daily)   Assessment:  70 year old gentleman living alone in [place] with multiple comorbid chronic health conditions. He has made exceptional progress with management and improvement of CHF, lymphedema, and diabetes disease management over the course of this year.   Chronic Health Condition (Lymphedema) - Michael [name] attended the lymphedema treatment program at [place] he is very pleased with the improvement in his lower extremity swelling and skin condition. He is now  participating in outpatient PT for strengthening,conditioning, and ambulation. He denies any further problems with leg swelling since completion of the program. He says he is better able to participate in physical therapy since his legs are no longer so edematous.    Post Op Cataract Surgery (bilateral)- Mr. Blossom has now had (2 separate occasions) bilateral cataract surgery. He had no surgical complications and says he is "thrilled" with his results.   Chronic Health Condition (CHF)- Mr. Lefkowitz denies any signs or symptoms of worsening CHF. His weight today is 191# (he has lost 40 pounds over the course of the last 7 months). He denies shortness of breath, chest pain, lightheadedness, dizziness, nausea, or decreased urine output. Mr. Cordrey does have 2+ bilateral lower extremity edema pre-tibial to mid calf but this is still a drastic improvement from his previous baseline.   Today, Mr. Strohm reports some mild cramping in his legs and notes that he took an extra dose of Demadex because of his leg swelling and per Mr. Chery, as per  Dr. Nelly Laurence recommendation during their last office visit. Of note, Mr. Philipp latest potassium levels were as noted below. I performed venipuncture today for BMET, Magnesium, and HgA1C and delivered specimen to [facility] labs with results to be reported to Dr. Buelah Manis and Dr. Harl Bowie.   07/05/16 K+ = 3.6 05/23/16 K+ = 4.3  Chronic Health Condition (DM)- Mr. Mccabe cbg's have improved over the last few months; most recent HgA1C = 6.7 (04/10/16); he is taking oral medications and insulin as prescribed; Mr. Wessler is participating in out patient physical therapy and therefore exercising at least 3 days/week. His insulin is being delivered to his home in 90 day supply by the [facility].   Plan:   I will follow up with Dr. Franne Forts. Buelah Manis re: Mr. Petrizzo's leg cramps and lab results then will follow up with Mr. Bignell.   Mr. Riano agrees that he has  progressed and is ready to transition to telephonic case management. I will transition him to telephonic and will call him myself next month before transferring him to the telephonic case manager for ongoing follow up.    [place] Management  [phone]

## 2016-08-09 LAB — MAGNESIUM
BUN / CREAT RATIO: 21
BUN: 31
CALCIUM: 9.1 mg/dL
CO2: 28
Creat: 1.47
GFR CALC AF AMER: 56
GFR CALC NON AF AMER: 48
Glucose: 146
Magnesium: 1.3
Potassium: 4.1 mmol/L
Sodium: 133

## 2016-08-09 LAB — HEMOGLOBIN A1C: HEMOGLOBIN A1C: 6.2

## 2016-08-12 ENCOUNTER — Telehealth: Payer: Self-pay | Admitting: *Deleted

## 2016-08-12 NOTE — Telephone Encounter (Signed)
-----   Message from Imogene Burn, PA-C sent at 08/12/2016  7:55 AM EST ----- Labs similar to a month ago. No changes.

## 2016-08-12 NOTE — Telephone Encounter (Signed)
Called patient with test results. No answer. Unable to leave message.

## 2016-08-24 ENCOUNTER — Other Ambulatory Visit: Payer: Self-pay | Admitting: Family Medicine

## 2016-09-09 ENCOUNTER — Other Ambulatory Visit: Payer: Self-pay | Admitting: *Deleted

## 2016-09-09 NOTE — Patient Outreach (Signed)
[facility]) Care Management  09/09/2016  JOSAFAT ENRICO 1946-02-09 [id number]  I reached out to Mr. Liddy today to follow up on his progress but was unable to reach him. I left a HIPPA compliant voice message requesting a return call.   Plan: I will reach out to Mr. Elpers by phone within a week if I do not hear back from him.    [place] Management  [phone]

## 2016-09-10 ENCOUNTER — Telehealth: Payer: Self-pay | Admitting: *Deleted

## 2016-09-10 NOTE — Telephone Encounter (Signed)
Okay to refill? 

## 2016-09-10 NOTE — Telephone Encounter (Signed)
Received call from patient.   Requested refill on Percocet.   Ok to refill??  Last office visit/ refill 07/30/2016.

## 2016-09-11 ENCOUNTER — Other Ambulatory Visit: Payer: Self-pay | Admitting: *Deleted

## 2016-09-11 MED ORDER — OXYCODONE-ACETAMINOPHEN 7.5-325 MG PO TABS: ORAL_TABLET | ORAL | 0 refills | Status: DC

## 2016-09-11 NOTE — Telephone Encounter (Signed)
Prescription printed and patient made aware to come to office to pick up on 09/11/2016.

## 2016-09-11 NOTE — Patient Outreach (Addendum)
[facility]) Care Management   09/11/2016  DERRYCK SHAHAN 07-Jul-1946 [id number]  Michael [name] is an 70 y.o. male with chronic diastolic heart failure with EF 60-65%, history of bradycardia, type II Diabetes, Hypertension, Iron Deficiency Anemia, NASH + Hepatomegaly, and degeneratove disc disease s/p surgery of L2-L3.   Mr. Favaro was admitted to the hospital in February with Acute/Chronic Diastolic Heart Failure and worsening lymphedema. Thereafter, he spent 21 days at [facility]. Mr. Biebel was referred to [place] [facility] for assistance with management of CHF.   [facility] Care Management has been following Mr. Litsey in the community since his discharge from SNF. Focus of care management services has been around CHF and DM Disease management with particular attention to volume management and peripheral edema/lymphedema and lower extremity skin ailments.   I am seeing Mr. Keeling today at his request to look at a skin condition he cannot easily visualize without assistance.   Subjective: "I just have this one place on my leg I'm not sure about and I need you to help me look at it."   Objective:  BP (!) 144/80   Pulse 86   Wt 199 lb (90.3 kg)   SpO2 98%   BMI 40.19 kg/m   Review of Systems  Constitutional: Negative.   Respiratory: Negative for cough and shortness of breath.   Cardiovascular: Positive for leg swelling. Negative for chest pain, palpitations and orthopnea.  Gastrointestinal: Negative for abdominal pain, heartburn, nausea and vomiting.  Genitourinary: Negative.   Musculoskeletal: Negative.   Skin:       Solitary skin lesion appears to be ruptured vesicle; edges well approximated and healing; no signs/symptoms of infection; not weeping, oozing, or bleeding  Neurological: Negative.  Negative for dizziness.  Psychiatric/Behavioral: Negative.     Physical Exam  Constitutional: He is oriented to person, place, and time. Vital signs are normal. He appears  well-developed and well-nourished. He is active.  Non-toxic appearance. He does not have a sickly appearance. He does not appear ill.  Cardiovascular: Normal rate and regular rhythm.   Respiratory: Effort normal and breath sounds normal. He has no wheezes. He has no rhonchi. He has no rales.  GI: Soft. Bowel sounds are normal. He exhibits no distension.  Neurological: He is alert and oriented to person, place, and time.  Skin: Skin is warm, dry and intact.  Psychiatric: His behavior is normal. Cognition and memory are normal.    Encounter Medications:   Outpatient Encounter Prescriptions as of 09/11/2016  Medication Sig Note  . ACCU-CHEK AVIVA PLUS test strip USE AS DIRECTED TWICE DAILY.   Marland Kitchen aspirin EC 81 MG tablet Take 81 mg by mouth daily.   . B-D ULTRAFINE III SHORT PEN 31G X 8 MM MISC USE AS DIRECTED DAILY WITH LANTUS.   Marland Kitchen benazepril (LOTENSIN) 40 MG tablet TAKE ONE TABLET BY MOUTH DAILY.   . BESIVANCE 0.6 % SUSP Place 1 drop into both eyes as directed. Completed therapy for left eye 07/15/16  To begin therapy for right eye on 07/19/16 (Three times daily) 07/16/2016: Completed therapy for left eye 07/15/16  To begin therapy for right eye on 07/19/16 (Three times daily)   . bumetanide (BUMEX) 2 MG tablet TAKE 1&1/2 TABLETS BY MOUTH TWICE DAILY. (Patient taking differently: TAKE 2 MG (1 TABLET) TABLETS BY MOUTH TWICE DAILY.)   . BYSTOLIC 10 MG tablet TAKE ONE TABLET BY MOUTH DAILY.   . diazepam (VALIUM) 5 MG tablet TAKE 1 TABLET BY MOUTH  AT BEDTIME AS NEEDED FOR ANXIETY.   . diclofenac sodium (VOLTAREN) 1 % GEL Apply 4 g topically 4 (four) times daily. (Patient taking differently: Apply 4 g topically 4 (four) times daily as needed (pain). )   . DUREZOL 0.05 % EMUL Place 1 drop into both eyes 3 (three) times daily. Patient to continue therapy in LEFT EYE until 08/05/16. Patient to begin therapy course for RIGHT EYE on 07/22/16 following surgery and to continue for 3 weeks 07/16/2016: Patient to  continue therapy in LEFT EYE until 08/05/16. Patient to begin therapy course for RIGHT EYE on 07/22/16 following surgery and to continue for 3 weeks   . Ferrous Sulfate (IRON) 325 (65 FE) MG TABS Take 1 tablet by mouth daily.    Marland Kitchen gabapentin (NEURONTIN) 400 MG capsule TAKE ONE CAPSULE BY MOUTH THREE TIMES DAILY.   Marland Kitchen ibuprofen (ADVIL,MOTRIN) 800 MG tablet Take 800 mg by mouth 3 (three) times daily as needed. For pain   . LANTUS SOLOSTAR 100 UNIT/ML Solostar Pen INJECT 30 UNITS SUBCUTANEOUSLY AT BEDTIME.   Marland Kitchen levothyroxine (SYNTHROID, LEVOTHROID) 88 MCG tablet TAKE 1 TABLET BY MOUTH DAILY BEFORE BREAKFAST.   . meloxicam (MOBIC) 15 MG tablet TAKE ONE TABLET BY MOUTH DAILY WITH FOOD.   Marland Kitchen metFORMIN (GLUCOPHAGE) 1000 MG tablet TAKE ONE TABLET BY MOUTH TWICE DAILY WITH A MEAL.   Marland Kitchen metolazone (ZAROXOLYN) 2.5 MG tablet TAKE 1 TABLET BY MOUTH DAILY.   Marland Kitchen nystatin cream (MYCOSTATIN) APPLY TO AFFECTED AREA TWICE DAILY.   Marland Kitchen omeprazole (PRILOSEC) 20 MG capsule TAKE ONE CAPSULE BY MOUTH TWICE DAILY.   Marland Kitchen oxyCODONE-acetaminophen (PERCOCET) 7.5-325 MG tablet Take one tablet by mouth every 6 hours as needed for severe pain. Max APAP 3gm/24hrs from all sources   . pravastatin (PRAVACHOL) 40 MG tablet TAKE ONE TABLET BY MOUTH DAILY. (Patient taking differently: TAKE ONE TABLET BY MOUTH DAILY IN THE EVENING)   . PROLENSA 0.07 % SOLN Place 1 drop into both eyes as directed. To complete course on left 08/05/16 and to begin therapy for right eye on 07/19/16 (once daily) 07/16/2016: To complete course on left 08/05/16 and to begin therapy for right eye on 07/19/16 (once daily)   Assessment:    [facility] Provider Visit - Mr. Heinlen saw Dr. Lorre Munroe (primary care) at [facility] in [place], [place] on 08/14/16. Mr. Stann said he was told he had "low uric acid" and was prescribed Vitamin D2 50,000U to be taken on Monday and Thursday only, Prednisone 66m 5 day dose pack, Levothyroxine NA 0.1264monce daily; Mr. GoHayhurstis to see Dr. MuLaban Emperorn 6 months for routine visit, labs, and medication check. He says Dr. MuLaban Emperorpproved and ordered outpatient PT services which will be undertaken in [place]  Acute/Chronic Skin Condition -  Mr. GoBevinsas what appears to be a ruptured vesicle on the left lower leg; having had a long course of lymphedema treatment and various skin problems with his legs, he wanted me to take a look at the area; he has a solitary skin lesion appears to be ruptured vesicle with well approximated edges which appears to be healing; no signs/symptoms of infection; not weeping, oozing, or bleeding     Acute/Chronic Health Condition (peripheral edema) - Mr. GoHelgetas peripheral edema of bilateral lower extremities, mostly in the feet and certainly not near the amount or severity I have seen in the past 6 months.  His weight is 199# today (up from 192# in October).  He is not short of breath and his breath sounds are clear. He does not have swelling of the abdomen or other signs/symptoms of CHF.     Plan:  I will follow up with Mr. Stclair by phone next week and will plan to see him in January for a routine home visit.    [place] Management  [phone]

## 2016-09-14 ENCOUNTER — Other Ambulatory Visit: Payer: Self-pay | Admitting: Family Medicine

## 2016-09-16 ENCOUNTER — Ambulatory Visit: Payer: Self-pay | Admitting: *Deleted

## 2016-09-16 DIAGNOSIS — E1140 Type 2 diabetes mellitus with diabetic neuropathy, unspecified: Secondary | ICD-10-CM | POA: Diagnosis not present

## 2016-09-16 DIAGNOSIS — Z7982 Long term (current) use of aspirin: Secondary | ICD-10-CM | POA: Diagnosis not present

## 2016-09-16 DIAGNOSIS — I890 Lymphedema, not elsewhere classified: Secondary | ICD-10-CM | POA: Diagnosis not present

## 2016-09-16 DIAGNOSIS — Z794 Long term (current) use of insulin: Secondary | ICD-10-CM | POA: Diagnosis not present

## 2016-09-16 DIAGNOSIS — I5040 Unspecified combined systolic (congestive) and diastolic (congestive) heart failure: Secondary | ICD-10-CM | POA: Diagnosis not present

## 2016-09-16 NOTE — Telephone Encounter (Signed)
Medication called to pharmacy. 

## 2016-09-16 NOTE — Telephone Encounter (Signed)
Ok to refill??  Last office visit 07/30/2016.  Last refill 06/11/2016, #2 refills.

## 2016-09-16 NOTE — Telephone Encounter (Signed)
okay

## 2016-09-18 NOTE — Progress Notes (Signed)
Michael [name], appears Mr Goodwins weights are trending up. Verify he is taking his bumex 61m bid and metolazone 2.529mdaily, and if so have him take metolazone 2.37m30mid for 2 days then resume once daily.   J BrancH MD

## 2016-09-18 NOTE — Progress Notes (Signed)
Michael [name], verify patient is taking bumex 4m bid and metolazone 2.562mdaily. If so have him increase metolazone to 2.29m44mid for 2 days then resume 2.29mg76mily. Appears his weights are trending back up.   J Avriel Kandel MD

## 2016-09-19 ENCOUNTER — Telehealth: Payer: Self-pay | Admitting: *Deleted

## 2016-09-19 DIAGNOSIS — I890 Lymphedema, not elsewhere classified: Secondary | ICD-10-CM | POA: Diagnosis not present

## 2016-09-19 DIAGNOSIS — E1140 Type 2 diabetes mellitus with diabetic neuropathy, unspecified: Secondary | ICD-10-CM | POA: Diagnosis not present

## 2016-09-19 DIAGNOSIS — I5040 Unspecified combined systolic (congestive) and diastolic (congestive) heart failure: Secondary | ICD-10-CM | POA: Diagnosis not present

## 2016-09-19 DIAGNOSIS — Z7982 Long term (current) use of aspirin: Secondary | ICD-10-CM | POA: Diagnosis not present

## 2016-09-19 DIAGNOSIS — Z794 Long term (current) use of insulin: Secondary | ICD-10-CM | POA: Diagnosis not present

## 2016-09-19 NOTE — Telephone Encounter (Signed)
Received call from [place], [place] with [facility].   Reports that patient has 1" diameter blister to lower leg. States that area around blister is red and has some swelling. Requested order for [facility] SN to eval and tx as indicated for wound care. VO given.   Advised that if area becomes warm to touch or open, OV will be needed to eval. MD to be made aware.

## 2016-09-20 ENCOUNTER — Telehealth: Payer: Self-pay | Admitting: Family Medicine

## 2016-09-20 DIAGNOSIS — Z794 Long term (current) use of insulin: Secondary | ICD-10-CM | POA: Diagnosis not present

## 2016-09-20 DIAGNOSIS — E1140 Type 2 diabetes mellitus with diabetic neuropathy, unspecified: Secondary | ICD-10-CM | POA: Diagnosis not present

## 2016-09-20 DIAGNOSIS — I890 Lymphedema, not elsewhere classified: Secondary | ICD-10-CM | POA: Diagnosis not present

## 2016-09-20 DIAGNOSIS — Z7982 Long term (current) use of aspirin: Secondary | ICD-10-CM | POA: Diagnosis not present

## 2016-09-20 DIAGNOSIS — I5040 Unspecified combined systolic (congestive) and diastolic (congestive) heart failure: Secondary | ICD-10-CM | POA: Diagnosis not present

## 2016-09-20 NOTE — Telephone Encounter (Signed)
Kathlee Nations from [facility] called today to let dr Buelah Manis know that his legs were red and blistered advised her that if it got worse for him to go to er  [phone]

## 2016-09-20 NOTE — Telephone Encounter (Signed)
Noted agree with orders for [facility]

## 2016-09-20 NOTE — Telephone Encounter (Signed)
noted 

## 2016-09-20 NOTE — Telephone Encounter (Signed)
Call placed to [place], [place] that she feels that legs are exhibiting more lymphedema. States that legs are cool to touch and pink. Requested order for Pred 4 (Unna boot) wraps.   VO given.

## 2016-09-24 DIAGNOSIS — E1140 Type 2 diabetes mellitus with diabetic neuropathy, unspecified: Secondary | ICD-10-CM | POA: Diagnosis not present

## 2016-09-24 DIAGNOSIS — Z7982 Long term (current) use of aspirin: Secondary | ICD-10-CM | POA: Diagnosis not present

## 2016-09-24 DIAGNOSIS — I5040 Unspecified combined systolic (congestive) and diastolic (congestive) heart failure: Secondary | ICD-10-CM | POA: Diagnosis not present

## 2016-09-24 DIAGNOSIS — I890 Lymphedema, not elsewhere classified: Secondary | ICD-10-CM | POA: Diagnosis not present

## 2016-09-24 DIAGNOSIS — Z794 Long term (current) use of insulin: Secondary | ICD-10-CM | POA: Diagnosis not present

## 2016-09-26 ENCOUNTER — Other Ambulatory Visit: Payer: Self-pay | Admitting: *Deleted

## 2016-09-26 DIAGNOSIS — E1140 Type 2 diabetes mellitus with diabetic neuropathy, unspecified: Secondary | ICD-10-CM | POA: Diagnosis not present

## 2016-09-26 DIAGNOSIS — Z7982 Long term (current) use of aspirin: Secondary | ICD-10-CM | POA: Diagnosis not present

## 2016-09-26 DIAGNOSIS — I5040 Unspecified combined systolic (congestive) and diastolic (congestive) heart failure: Secondary | ICD-10-CM | POA: Diagnosis not present

## 2016-09-26 DIAGNOSIS — Z794 Long term (current) use of insulin: Secondary | ICD-10-CM | POA: Diagnosis not present

## 2016-09-26 DIAGNOSIS — I890 Lymphedema, not elsewhere classified: Secondary | ICD-10-CM | POA: Diagnosis not present

## 2016-09-26 NOTE — Patient Outreach (Signed)
[facility]) Care Management  09/26/2016  ULES MARSALA [birth year] [id number]  PER BEAGLEY is an 70 y.o. male with chronic diastolic heart failure with EF 60-65%, history of bradycardia, type II Diabetes, Hypertension, Iron Deficiency Anemia, NASH + Hepatomegaly, and degeneratove disc disease s/p surgery of L2-L3.   [facility] Care Management has been following Mr. Gornick in the community since his discharge from SNF. Focus of care management services has been around CHF and DM Disease management with particular attention to volume management and peripheral edema/lymphedema and lower extremity skin ailments. Mr. Leitzel was treated over the summer/fall in the lymphedema clinic with impressive results.   Acute/Chronic Skin Condition (redness, swelling, blisters) - Mr. Harker called on 12/13 and asked me to see him to look at a new blister on his left leg (see note and images in medical record). On 12/22, the home physical therapist noted a 1" diameter blister to lower leg which she noted had surrounding redness and swelling. Orders for [facility] visits and Una boots were given by Dr. Buelah Manis.    I reached out to Mr. Leete by phone today to follow up on his progress. Mr. Mullens reports that he had dressings (Una boots) placed on his legs by the home health nurse last week but that his legs looked so much better this week, she didn't put them back on. He feels his legs look and feel much better. Home health visits are to continue at least through next week.   Plan: I will see Mr. Bradsher at home next week as scheduled.    [place] Management  [phone]

## 2016-10-02 DIAGNOSIS — I890 Lymphedema, not elsewhere classified: Secondary | ICD-10-CM | POA: Diagnosis not present

## 2016-10-02 DIAGNOSIS — R809 Proteinuria, unspecified: Secondary | ICD-10-CM | POA: Diagnosis not present

## 2016-10-02 DIAGNOSIS — L89312 Pressure ulcer of right buttock, stage 2: Secondary | ICD-10-CM | POA: Diagnosis not present

## 2016-10-02 DIAGNOSIS — R269 Unspecified abnormalities of gait and mobility: Secondary | ICD-10-CM | POA: Diagnosis not present

## 2016-10-02 DIAGNOSIS — R609 Edema, unspecified: Secondary | ICD-10-CM | POA: Diagnosis not present

## 2016-10-03 ENCOUNTER — Telehealth: Payer: Self-pay | Admitting: Family Medicine

## 2016-10-03 ENCOUNTER — Other Ambulatory Visit: Payer: Self-pay | Admitting: *Deleted

## 2016-10-03 DIAGNOSIS — I5040 Unspecified combined systolic (congestive) and diastolic (congestive) heart failure: Secondary | ICD-10-CM | POA: Diagnosis not present

## 2016-10-03 DIAGNOSIS — Z4800 Encounter for change or removal of nonsurgical wound dressing: Secondary | ICD-10-CM | POA: Diagnosis not present

## 2016-10-03 DIAGNOSIS — Z794 Long term (current) use of insulin: Secondary | ICD-10-CM | POA: Diagnosis not present

## 2016-10-03 DIAGNOSIS — E1140 Type 2 diabetes mellitus with diabetic neuropathy, unspecified: Secondary | ICD-10-CM | POA: Diagnosis not present

## 2016-10-03 DIAGNOSIS — Z7982 Long term (current) use of aspirin: Secondary | ICD-10-CM | POA: Diagnosis not present

## 2016-10-03 DIAGNOSIS — I890 Lymphedema, not elsewhere classified: Secondary | ICD-10-CM | POA: Diagnosis not present

## 2016-10-03 NOTE — Telephone Encounter (Signed)
Call placed to patient and patient made aware.   Advised to keep record of BP when checked by [facility] and contact in 1 week with readings.   Call placed to [facility] SN to make aware. Verbalized understanding.

## 2016-10-03 NOTE — Telephone Encounter (Signed)
Returned call to [place], [facility] SN with [facility].   Reports that patient BP readings are as follows: 09/26/2016 102/60 10/03/2016 98/60  Call placed to patient to verify medications. Patient is currently taking Benazepril 37CH QD, Bystolic 88FO QD, and Bumex 52m BID. Patient denies lightheadedness or dizziness.   Patient does not have home BP monitor at this time. HH SN visits patient Q week for wound care and PT visits 2x week.   MD please advise.

## 2016-10-03 NOTE — Patient Outreach (Signed)
[facility]) Care Management  10/03/2016  Michael [name] 08/07/1946 [id number]  I spoke with Michael [name] by phone today to follow up on progress with his leg wounds. He says his home health nurse continues to visit and she is pleased with his progress. Michael [name] says he also is pleased with how well his legs and skin have responded to treatment.   Plan: I will follow up with Michael [name] by phone in 2 weeks. Michael [name] will call Dr. Buelah Manis for any new or worsened symptoms. He will see Dr. Harl Bowie ( cardiology ) in the office on 1/23//18 and Dr. Buelah Manis ( primary care ) on 11/27/16 as scheduled.    [place] Management  [phone]

## 2016-10-03 NOTE — Telephone Encounter (Signed)
Decrease bystolic to 5 mg a day

## 2016-10-03 NOTE — Telephone Encounter (Signed)
[facility] calling regarding this patients blood pressure readings please call back at [phone]

## 2016-10-04 DIAGNOSIS — Z7982 Long term (current) use of aspirin: Secondary | ICD-10-CM | POA: Diagnosis not present

## 2016-10-04 DIAGNOSIS — Z4800 Encounter for change or removal of nonsurgical wound dressing: Secondary | ICD-10-CM | POA: Diagnosis not present

## 2016-10-04 DIAGNOSIS — I890 Lymphedema, not elsewhere classified: Secondary | ICD-10-CM | POA: Diagnosis not present

## 2016-10-04 DIAGNOSIS — E1140 Type 2 diabetes mellitus with diabetic neuropathy, unspecified: Secondary | ICD-10-CM | POA: Diagnosis not present

## 2016-10-04 DIAGNOSIS — Z794 Long term (current) use of insulin: Secondary | ICD-10-CM | POA: Diagnosis not present

## 2016-10-04 DIAGNOSIS — I5040 Unspecified combined systolic (congestive) and diastolic (congestive) heart failure: Secondary | ICD-10-CM | POA: Diagnosis not present

## 2016-10-09 ENCOUNTER — Telehealth: Payer: Self-pay | Admitting: *Deleted

## 2016-10-09 ENCOUNTER — Encounter: Payer: Self-pay | Admitting: Family Medicine

## 2016-10-09 DIAGNOSIS — I890 Lymphedema, not elsewhere classified: Secondary | ICD-10-CM | POA: Diagnosis not present

## 2016-10-09 DIAGNOSIS — Z7982 Long term (current) use of aspirin: Secondary | ICD-10-CM | POA: Diagnosis not present

## 2016-10-09 DIAGNOSIS — I5040 Unspecified combined systolic (congestive) and diastolic (congestive) heart failure: Secondary | ICD-10-CM | POA: Diagnosis not present

## 2016-10-09 DIAGNOSIS — Z794 Long term (current) use of insulin: Secondary | ICD-10-CM | POA: Diagnosis not present

## 2016-10-09 DIAGNOSIS — Z4800 Encounter for change or removal of nonsurgical wound dressing: Secondary | ICD-10-CM | POA: Diagnosis not present

## 2016-10-09 DIAGNOSIS — E1140 Type 2 diabetes mellitus with diabetic neuropathy, unspecified: Secondary | ICD-10-CM | POA: Diagnosis not present

## 2016-10-09 MED ORDER — OXYCODONE-ACETAMINOPHEN 7.5-325 MG PO TABS
ORAL_TABLET | ORAL | 0 refills | Status: DC
Start: 2016-10-09 — End: 2016-11-04

## 2016-10-09 MED ORDER — DIAZEPAM 5 MG PO TABS: ORAL_TABLET | ORAL | 2 refills | Status: DC

## 2016-10-09 NOTE — Telephone Encounter (Signed)
Received call from patient with multiple concerns:  1. BP readings are as follows: 1/5 122/850 1/10 98/64 Patient is currently taking Benazepril 28JG QD, Bystolic 19m QD, and Bumex 226mBID. MD please advise.   2. Patient has been called for [employer] that he is supposed to be in court on 11/04/2016. Requested letter to excuse patient from jury duty D/T numerous health issues. Ok to write letter?  3. [facility]. Reports that he was advised to contact our office for refills of Oycodone/ APAP and Diazepam. States that he does also have [employer], and they will help with co-pay if patient fills prescriptions at [facility] both?

## 2016-10-09 NOTE — Telephone Encounter (Signed)
Refill medication FOR Percocet No change to blood pressure medication- see if Janalyn Shy can drop by and check blood pressure, he has appt with cardiology on 1/23 so can keep that one if not symptomatic

## 2016-10-09 NOTE — Telephone Encounter (Signed)
Prescription printed and patient made aware to come to office to pick up on 10/10/2016.  Message sent to [facility] to re-check BP.   MD to write jury duty letter.

## 2016-10-10 ENCOUNTER — Other Ambulatory Visit: Payer: Self-pay | Admitting: Family Medicine

## 2016-10-10 ENCOUNTER — Other Ambulatory Visit: Payer: Self-pay | Admitting: *Deleted

## 2016-10-10 NOTE — Telephone Encounter (Signed)
Please see prior notes.

## 2016-10-10 NOTE — Patient Outreach (Signed)
Michael [name]) Care Management   10/10/2016  Michael [name] Feb 21, 1946 [id number]  Michael [name] is an 71 y.o. male with chronic diastolic heart failure with EF 60-65%, history of bradycardia, type II Diabetes, Hypertension, Iron Deficiency Anemia, NASH + Hepatomegaly, and degeneratove disc disease s/p surgery of L2-L3.   [name] Care Management has been following Michael [name] in the community since his discharge from SNF. Focus of care management services has been around CHF and DM Disease management with particular attention to volume management and peripheral edema/lymphedema and lower extremity skin ailments.  Subjective: "I feel perfectly fine. But they said my blood pressure is a little too low."  Objective:  BP 108/62   Pulse 78   SpO2 97%   Review of Systems  Constitutional: Negative.   HENT: Negative.   Eyes: Negative for blurred vision and double vision.  Respiratory: Negative.   Cardiovascular: Negative for chest pain and palpitations.  Gastrointestinal: Negative.  Negative for heartburn and nausea.  Musculoskeletal: Negative for falls.  Skin: Negative.   Neurological: Negative for dizziness, tingling, sensory change, speech change, focal weakness, loss of consciousness and headaches.  Psychiatric/Behavioral: Negative.     Physical Exam  Constitutional: He is oriented to person, place, and time. He appears well-developed and well-nourished. He is active.  Non-toxic appearance. He does not have a sickly appearance. He does not appear ill.  Cardiovascular: Normal rate.  An irregularly irregular rhythm present.  Respiratory: No respiratory distress.  Neurological: He is alert and oriented to person, place, and time.  Skin: Skin is warm and dry.  Psychiatric: He has a normal mood and affect. His speech is normal and behavior is normal. Judgment and thought content normal. Cognition and memory are normal.    Encounter Medications:   Outpatient Encounter  Prescriptions as of 10/10/2016  Medication Sig Note  . aspirin EC 81 MG tablet Take 81 mg by mouth daily.   . bumetanide (BUMEX) 2 MG tablet TAKE 1&1/2 TABLETS BY MOUTH TWICE DAILY. (Patient taking differently: TAKE 2 MG (1 TABLET) TABLETS BY MOUTH TWICE DAILY.)   . BYSTOLIC 10 MG tablet TAKE ONE TABLET BY MOUTH DAILY.   Marland Kitchen Ferrous Sulfate (IRON) 325 (65 FE) MG TABS Take 1 tablet by mouth daily.    Marland Kitchen gabapentin (NEURONTIN) 400 MG capsule TAKE ONE CAPSULE BY MOUTH THREE TIMES DAILY.   Marland Kitchen levothyroxine (SYNTHROID, LEVOTHROID) 88 MCG tablet TAKE 1 TABLET BY MOUTH DAILY BEFORE BREAKFAST.   . meloxicam (MOBIC) 15 MG tablet TAKE ONE TABLET BY MOUTH DAILY WITH FOOD.   Marland Kitchen metFORMIN (GLUCOPHAGE) 1000 MG tablet TAKE ONE TABLET BY MOUTH TWICE DAILY WITH A MEAL.   Marland Kitchen metolazone (ZAROXOLYN) 2.5 MG tablet TAKE 1 TABLET BY MOUTH DAILY.   Marland Kitchen omeprazole (PRILOSEC) 20 MG capsule TAKE ONE CAPSULE BY MOUTH TWICE DAILY.   . pravastatin (PRAVACHOL) 40 MG tablet TAKE ONE TABLET BY MOUTH DAILY. (Patient taking differently: TAKE ONE TABLET BY MOUTH DAILY IN THE EVENING)   . ACCU-CHEK AVIVA PLUS test strip USE AS DIRECTED TWICE DAILY.   Marland Kitchen B-D ULTRAFINE III SHORT PEN 31G X 8 MM MISC USE AS DIRECTED DAILY WITH LANTUS.   Marland Kitchen benazepril (LOTENSIN) 40 MG tablet TAKE ONE TABLET BY MOUTH DAILY.   . BESIVANCE 0.6 % SUSP Place 1 drop into both eyes as directed. Completed therapy for left eye 07/15/16  To begin therapy for right eye on 07/19/16 (Three times daily) 07/16/2016: Completed therapy for left eye 07/15/16  To  begin therapy for right eye on 07/19/16 (Three times daily)   . diazepam (VALIUM) 5 MG tablet TAKE 1 TABLET BY MOUTH AT BEDTIME AS NEEDED FOR ANXIETY.   . diclofenac sodium (VOLTAREN) 1 % GEL Apply 4 g topically 4 (four) times daily. (Patient taking differently: Apply 4 g topically 4 (four) times daily as needed (pain). )   . DUREZOL 0.05 % EMUL Place 1 drop into both eyes 3 (three) times daily. Patient to continue therapy in  LEFT EYE until 08/05/16. Patient to begin therapy course for RIGHT EYE on 07/22/16 following surgery and to continue for 3 weeks 07/16/2016: Patient to continue therapy in LEFT EYE until 08/05/16. Patient to begin therapy course for RIGHT EYE on 07/22/16 following surgery and to continue for 3 weeks   . ibuprofen (ADVIL,MOTRIN) 800 MG tablet Take 800 mg by mouth 3 (three) times daily as needed. For pain   . LANTUS SOLOSTAR 100 UNIT/ML Solostar Pen INJECT 30 UNITS SUBCUTANEOUSLY AT BEDTIME.   Marland Kitchen nystatin cream (MYCOSTATIN) APPLY TO AFFECTED AREA TWICE DAILY.   Marland Kitchen oxyCODONE-acetaminophen (PERCOCET) 7.5-325 MG tablet Take one tablet by mouth every 6 hours as needed for severe pain. Max APAP 3gm/24hrs from all sources   . PROLENSA 0.07 % SOLN Place 1 drop into both eyes as directed. To complete course on left 08/05/16 and to begin therapy for right eye on 07/19/16 (once daily) 07/16/2016: To complete course on left 08/05/16 and to begin therapy for right eye on 07/19/16 (once daily)   Assessment:  Michael [name] is a 71 year old gentleman living in [place]. He has history of chronic diastolic heart failure and history of bradycardia. His primary care provider asked me to follow up with him today because of recent home health findings of lower than typical blood pressure readings.   I visited Michael [name] at home today. His blood pressure was 108/62 as outlined above. His heart rate is variable but between 68 and 76. However, by auscultation, his rhythm is irregularly irregular. His color is pink. He denies chest pain, shortness of breath, lightheadedness, dizziness, or other new or worsened cardiovascular symptom. He has mild peripheral edema, drastically improved over the last 6 months. He is voiding as usual and has a good appetite. His cbg's are in the 130's. He denies fever or illness. Michael [name] decreased his Bystolic dose from 27NT qd to 64m qd as per instruction from Dr. PDennard [name] beginning 10/07/16.   Plan: I  will update Dr. DBuelah Manisas per her request and forward this assessment documentation.   Mr. GBalestrieriwill continue to take his medications as prescribed until given further instructions. He will call if he has new or worsened symptoms and will call 911 if he has chest pain, shortness of breath, dizziness/lightheadedness.   I will follow up with Mr. GZobristby phone with any recommendations from his providers or follow up needed.    [facility]Management  [phone]

## 2016-10-10 NOTE — Telephone Encounter (Signed)
Received call from [place], [facility]. Relevant portion of note copied below: Assessment:  Michael [name] is a 71 year old gentleman living in [place]. He has history of chronic diastolic heart failure and history of bradycardia. His primary care provider asked me to follow up with him today because of recent home health findings of lower than typical blood pressure readings. I visited Michael [name] at home today. His blood pressure was 108/62 as outlined above. His heart rate is variable but between 68 and 76. However, by auscultation, his rhythm is irregularly irregular. His color is pink. He denies chest pain, shortness of breath, lightheadedness, dizziness, or other new or worsened cardiovascular symptom. He has mild peripheral edema, drastically improved over the last 6 months. He is voiding as usual and has a good appetite. His cbg's are in the 130's. He denies fever or illness. Michael [name] decreased his Bystolic dose from 61HH qd to 70m qd as per instruction from Michael [name] beginning 10/07/16.   Will forward to patient cardiologist for review.

## 2016-10-11 ENCOUNTER — Telehealth: Payer: Self-pay | Admitting: *Deleted

## 2016-10-11 DIAGNOSIS — I890 Lymphedema, not elsewhere classified: Secondary | ICD-10-CM | POA: Diagnosis not present

## 2016-10-11 DIAGNOSIS — I5040 Unspecified combined systolic (congestive) and diastolic (congestive) heart failure: Secondary | ICD-10-CM | POA: Diagnosis not present

## 2016-10-11 DIAGNOSIS — Z794 Long term (current) use of insulin: Secondary | ICD-10-CM | POA: Diagnosis not present

## 2016-10-11 DIAGNOSIS — E1140 Type 2 diabetes mellitus with diabetic neuropathy, unspecified: Secondary | ICD-10-CM | POA: Diagnosis not present

## 2016-10-11 DIAGNOSIS — Z4800 Encounter for change or removal of nonsurgical wound dressing: Secondary | ICD-10-CM | POA: Diagnosis not present

## 2016-10-11 DIAGNOSIS — Z7982 Long term (current) use of aspirin: Secondary | ICD-10-CM | POA: Diagnosis not present

## 2016-10-11 NOTE — Telephone Encounter (Signed)
Keep taking current doses including the 15m of bystolic He can keep appt in Jan

## 2016-10-11 NOTE — Telephone Encounter (Deleted)
-----   Message from [name], MD sent at 10/11/2016  2:20 PM EST ----- Regarding: RE: Blood Pressure/ Irregular Heart Rate  Contact: [phone] Im ok with med change. We will continue to monitor his bp's and heart rates, go through everything in detail at our upcoming f/u  [name] MD ----- Message ----- From: [name], LPN Sent: 4/41/7127   1:56 PM To: [name], MD, Clerance Lav, RN, # Subject: Blood Pressure/ Irregular Heart Rate           Hi!  We have been getting BP readings from his home health nurse. They were concerned because the readings have been slightly low: 09/26/2016      102/60 10/03/2016          98/60 1/5/ 2018         122/85 10/09/2016        98/64 On 10/03/2016, Dr. Buelah Manis was out of the office, so Dr. Dennard Schaumann reviewed and recommended decreasing Bystolic to 8m PO QD. Patient is currently taking Benazepril 487ZUQD, Bystolic 534mQD, and Bumex 36m60mID.  [facility] to see patient per Dr. DurDorian Hecklequest today ([facility] to be 108/62, HR 68 -76. States that HR is irregular and skips every 3-4th beat with slight pause. Reports that he may benefit from conducting EKG.   Patient has 3 month F/U scheduled with you on 10/22/2016.  Please advise.

## 2016-10-11 NOTE — Telephone Encounter (Signed)
Received call from Dellis Filbert, [facility] PT with [facility]. [phone]~ telephone.   Requested to extend PT services 2x/ week x3 weeks for BLE strengthening and ambulation.   VO given.

## 2016-10-11 NOTE — Telephone Encounter (Signed)
Agree with above 

## 2016-10-11 NOTE — Telephone Encounter (Signed)
Noted the irregulary heart rate, get earlier appt , needs to be seen, not sure if this is PVC or true arrythmia

## 2016-10-11 NOTE — Telephone Encounter (Signed)
New appointment scheduled with Amie [place], PA at [facility] on 10/16/2016 at 1:40pm.   Patient made aware and state that he will have to try to find transportation.   Requested patient return call if he cannot find transportation.

## 2016-10-11 NOTE — Telephone Encounter (Signed)
-----   Message from [name], MD sent at 10/11/2016  2:20 PM EST ----- Regarding: RE: Blood Pressure/ Irregular Heart Rate  Contact: [phone] Im ok with med change. We will continue to monitor his bp's and heart rates, go through everything in detail at our upcoming f/u  [name] MD ----- Message ----- From: [name], LPN Sent: 9/70/4492   1:56 PM To: [name], MD, Clerance Lav, RN, # Subject: Blood Pressure/ Irregular Heart Rate           Hi!  We have been getting BP readings from his home health nurse. They were concerned because the readings have been slightly low: 09/26/2016      102/60 10/03/2016          98/60 1/5/ 2018         122/85 10/09/2016        98/64 On 10/03/2016, Dr. Buelah Manis was out of the office, so Dr. Dennard Schaumann reviewed and recommended decreasing Bystolic to 60m PO QD. Patient is currently taking Benazepril 452WZQD, Bystolic 559mQD, and Bumex 32m432mID.  [name] went out to see patient per Dr. DurDorian Hecklequest today ([facility] noted to be 108/62, HR 68 -76. States that HR is irregular and skips every 3-4th beat with slight pause. Reports that he may benefit from conducting EKG.   Patient has 3 month F/U scheduled with you on 10/22/2016.  Please advise.

## 2016-10-14 DIAGNOSIS — Z4800 Encounter for change or removal of nonsurgical wound dressing: Secondary | ICD-10-CM | POA: Diagnosis not present

## 2016-10-14 DIAGNOSIS — I5040 Unspecified combined systolic (congestive) and diastolic (congestive) heart failure: Secondary | ICD-10-CM | POA: Diagnosis not present

## 2016-10-14 DIAGNOSIS — E1140 Type 2 diabetes mellitus with diabetic neuropathy, unspecified: Secondary | ICD-10-CM | POA: Diagnosis not present

## 2016-10-14 DIAGNOSIS — Z794 Long term (current) use of insulin: Secondary | ICD-10-CM | POA: Diagnosis not present

## 2016-10-14 DIAGNOSIS — I890 Lymphedema, not elsewhere classified: Secondary | ICD-10-CM | POA: Diagnosis not present

## 2016-10-14 DIAGNOSIS — Z7982 Long term (current) use of aspirin: Secondary | ICD-10-CM | POA: Diagnosis not present

## 2016-10-16 ENCOUNTER — Ambulatory Visit: Payer: Medicare Other | Admitting: Physician Assistant

## 2016-10-17 ENCOUNTER — Other Ambulatory Visit: Payer: Self-pay | Admitting: *Deleted

## 2016-10-17 DIAGNOSIS — Z4800 Encounter for change or removal of nonsurgical wound dressing: Secondary | ICD-10-CM | POA: Diagnosis not present

## 2016-10-17 DIAGNOSIS — Z7982 Long term (current) use of aspirin: Secondary | ICD-10-CM | POA: Diagnosis not present

## 2016-10-17 DIAGNOSIS — I890 Lymphedema, not elsewhere classified: Secondary | ICD-10-CM | POA: Diagnosis not present

## 2016-10-17 DIAGNOSIS — E1140 Type 2 diabetes mellitus with diabetic neuropathy, unspecified: Secondary | ICD-10-CM | POA: Diagnosis not present

## 2016-10-17 DIAGNOSIS — Z794 Long term (current) use of insulin: Secondary | ICD-10-CM | POA: Diagnosis not present

## 2016-10-17 DIAGNOSIS — I5040 Unspecified combined systolic (congestive) and diastolic (congestive) heart failure: Secondary | ICD-10-CM | POA: Diagnosis not present

## 2016-10-17 NOTE — Patient Outreach (Signed)
[facility]) Care Management  10/17/2016  KAZUTO SEVEY 06/18/1946 [id number]  I reached out to Mr. Dumond by phone today to follow up on his blood pressure readings, volume management, and skin condition. His home health nurse is in the home now and we spoke. She provided the following information to me:   Blood pressure readings:  10/17/16: 112/70 10/14/16: 110/80 10/11/16: 120/80 10/10/16:  108/62  10/09/16:  98/64  10/05/16:  122/80  Weight:  10/17/16 (today) =  205lb 10/10/16 = 193lb   Ankle Measurements:  10/17/16 (today) : 29 inches right / 28 inches on the left  10/10/16: 26.5inches bilaterally  Mr. [place] denies shortness of breath, chest pain, nausea, lightheadedness, dizziness, or other new symptom. He says his skin on his legs is intact but he is definitely swollen.   Mr. Comes nurse reports that his exam is benign, other than the edema of the lower extremities.   Mr. Kirchman is taking bumex 6m bid and metolazone 2.571mdaily in addition to Benazepril 4074ADD, Bystolic 1052ZGD. Mr. GoChristianatates Dr. BrHarl Bowienstructed him to  take metolazone 2.30m88mID for 2 days (then resume 2.30mg330mCE daily) when his weight gain exceeds 3 pounds overnight and/or he has significant swelling of the lower extremities and he plans to do this starting today.   Plan: I will update Dr. BranHarl Bowie Dr. DurhBuelah Manisut today's findings and I will follow up with Mr. GoodForsephone tomorrow to see what his weight is and how his swelling appears.    [facility]is[facility]astingsagement  [phone]

## 2016-10-18 ENCOUNTER — Other Ambulatory Visit: Payer: Self-pay | Admitting: *Deleted

## 2016-10-18 NOTE — Patient Outreach (Signed)
[facility]) Care Management  10/18/2016  Michael [name] 02-Sep-1946 [id number]  Outreach to Michael [name] today to follow up on his weight gain and leg swelling. See note from telephone conversation yesterday re: symptoms. As noted below, Michael [name] had a good response from the extra dose of Metolazone he took yesterday. He says he also applied his compression stockings. He feels his legs are "much much better" and his feet in particular are less swollen. He denies shortness of breath, fatigue, chest pain, or any other symptom and says he feels very well today.   Weight:  10/18/16 = 201lb (after additional dose of Metolazone yesterday/this morning) 10/17/16 =  205lb 10/10/16 = 193lb   Ankle Measurements:  10/17/16 : 29 inches right / 28 inches on the left  10/10/16: 26.5inches bilaterally  Plan: I will reach out to Michael [name] on Monday to follow up on his condition/progress.    [place] Management  [phone]    .

## 2016-10-21 DIAGNOSIS — Z794 Long term (current) use of insulin: Secondary | ICD-10-CM | POA: Diagnosis not present

## 2016-10-21 DIAGNOSIS — Z7982 Long term (current) use of aspirin: Secondary | ICD-10-CM | POA: Diagnosis not present

## 2016-10-21 DIAGNOSIS — Z4800 Encounter for change or removal of nonsurgical wound dressing: Secondary | ICD-10-CM | POA: Diagnosis not present

## 2016-10-21 DIAGNOSIS — I890 Lymphedema, not elsewhere classified: Secondary | ICD-10-CM | POA: Diagnosis not present

## 2016-10-21 DIAGNOSIS — I5040 Unspecified combined systolic (congestive) and diastolic (congestive) heart failure: Secondary | ICD-10-CM | POA: Diagnosis not present

## 2016-10-21 DIAGNOSIS — E1140 Type 2 diabetes mellitus with diabetic neuropathy, unspecified: Secondary | ICD-10-CM | POA: Diagnosis not present

## 2016-10-21 NOTE — Progress Notes (Signed)
Thanks Alisa, let me know how weights look this week when you touch base with him again   [employer]

## 2016-10-22 ENCOUNTER — Ambulatory Visit: Payer: Medicare Other | Admitting: Cardiology

## 2016-10-23 ENCOUNTER — Other Ambulatory Visit (HOSPITAL_COMMUNITY)
Admission: RE | Admit: 2016-10-23 | Discharge: 2016-10-23 | Disposition: A | Discharge status: Home / Self Care | Payer: Medicare HMO | Source: Ambulatory Visit | Attending: Physician Assistant | Admitting: Physician Assistant

## 2016-10-23 ENCOUNTER — Encounter: Payer: Self-pay | Admitting: Physician Assistant

## 2016-10-23 ENCOUNTER — Telehealth: Payer: Self-pay | Admitting: *Deleted

## 2016-10-23 ENCOUNTER — Ambulatory Visit (INDEPENDENT_AMBULATORY_CARE_PROVIDER_SITE_OTHER): Payer: Medicare HMO | Admitting: Physician Assistant

## 2016-10-23 VITALS — BP 118/62 | HR 75 | Ht 59.0 in | Wt 203.0 lb

## 2016-10-23 DIAGNOSIS — I10 Essential (primary) hypertension: Secondary | ICD-10-CM | POA: Diagnosis not present

## 2016-10-23 DIAGNOSIS — I5032 Chronic diastolic (congestive) heart failure: Secondary | ICD-10-CM | POA: Diagnosis not present

## 2016-10-23 DIAGNOSIS — I5022 Chronic systolic (congestive) heart failure: Secondary | ICD-10-CM | POA: Insufficient documentation

## 2016-10-23 DIAGNOSIS — I5042 Chronic combined systolic (congestive) and diastolic (congestive) heart failure: Secondary | ICD-10-CM | POA: Insufficient documentation

## 2016-10-23 DIAGNOSIS — I890 Lymphedema, not elsewhere classified: Secondary | ICD-10-CM

## 2016-10-23 LAB — BASIC METABOLIC PANEL
ANION GAP: 11 (ref 5–15)
BUN: 32 mg/dL — AB (ref 6–20)
CHLORIDE: 88 mmol/L — AB (ref 101–111)
CO2: 31 mmol/L (ref 22–32)
Calcium: 8.6 mg/dL — ABNORMAL LOW (ref 8.9–10.3)
Creatinine, Ser: 1.28 mg/dL — ABNORMAL HIGH (ref 0.61–1.24)
GFR, EST NON AFRICAN AMERICAN: 55 mL/min — AB (ref >60)
Glucose, Bld: 94 mg/dL (ref 65–99)
POTASSIUM: 4.1 mmol/L (ref 3.5–5.1)
SODIUM: 130 mmol/L — AB (ref 135–145)

## 2016-10-23 NOTE — Telephone Encounter (Signed)
-----   Message from Imogene Burn, PA-C sent at 10/23/2016  2:44 PM EST ----- Labs stable

## 2016-10-23 NOTE — Telephone Encounter (Signed)
Called patient with test results. No answer. Left message to call back.  

## 2016-10-23 NOTE — Progress Notes (Signed)
Cardiology Office Note    Date:  10/23/2016   ID:  Michael [name], DOB [birth year], MRN [id number]  PCP:  Vic Blackbird, MD  Cardiologist: Dr. Harl Bowie   Chief Complaint  Patient presents with  . Follow-up    History of Present Illness:  Michael [name] is a 71 y.o. male with chronic diastolic heart failure last echo 10/2015 LVEF 50-55% with grade 1 DD on Bumex 3 mg twice a day and metolazone 2.5 mg daily followed closely by [facility] as an outpatient. Also has lymphedema and completed the lymphedema clinic. I reviewed the notes from home health. On 10/17/16 his weight went from 193 pounds to 205. He took an extra dose of metolazone and his weight came down to 201 pounds. He was feeling much better.Last labs in the system are from 08/08/16 creatinine was 1.47 which is about his baseline.  Patient comes in today by himself. He feels like his weight has been stable. He thinks his baseline weight is about 197 pounds. On his scales he was 202 this morning he is 203 pounds on our scales. He admits to eating [employer] soup frequently. He is on a fixed income, in a wheelchair and has to prepare his own meals. He is getting more salt than he should.    Past Medical History:  Diagnosis Date  . Arthritis   . Congestive heart disease ([place]) 11/2015  . COPD (chronic obstructive pulmonary disease) ([place])   . DDD (degenerative disc disease), lumbosacral   . Depression   . Diabetic neuropathy ([place])   . Diastolic dysfunction   . Essential hypertension   . GERD (gastroesophageal reflux disease)   . Hyperlipidemia   . Iron deficiency anemia   . Left knee DJD   . Lumbar spinal stenosis   . Lymphedema 11/2015   BOTH LEGS  . NASH (nonalcoholic steatohepatitis)   . Peripheral edema   . Peripheral edema 10/05/2015  . Type 2 diabetes mellitus ([place])     Past Surgical History:  Procedure Laterality Date  . BIOPSY N/A 07/21/2014   Procedure: BIOPSY;  Surgeon: Daneil Dolin, MD;  Location: [facility];   Service: Endoscopy;  Laterality: N/A;  . CATARACT EXTRACTION W/PHACO Right 07/08/2016   Procedure: CATARACT EXTRACTION PHACO AND INTRAOCULAR LENS PLACEMENT RIGHT EYE;  Surgeon: Tonny Branch, MD;  Location: [facility];  Service: Ophthalmology;  Laterality: Right;  CDE: 9.01  . CATARACT EXTRACTION W/PHACO Left 07/22/2016   Procedure: CATARACT EXTRACTION PHACO AND INTRAOCULAR LENS PLACEMENT LEFT EYE CDE=10.69;  Surgeon: Tonny Branch, MD;  Location: [facility];  Service: Ophthalmology;  Laterality: Left;  left -   . COLONOSCOPY  2011   [facility]: normal colon, normal distal ileum  . COLONOSCOPY WITH PROPOFOL N/A 07/21/2014   Procedure: ATTEMPTED COLONOSCOPY WITH PROPOFOL-HAD TO STOP DUE TO BRADYCARDIA;  Surgeon: Daneil Dolin, MD;  Location: [facility];  Service: Endoscopy;  Laterality: N/A;  . EGD with enteroscopy  2011   [facility]: normal esophagus and stomach. Normal duodenum, jejunum. No evidence of AVMs.   . ESOPHAGOGASTRODUODENOSCOPY (EGD) WITH PROPOFOL N/A 07/21/2014   Procedure: ESOPHAGOGASTRODUODENOSCOPY (EGD) WITH PROPOFOL;  Surgeon: Daneil Dolin, MD;  Location: [facility];  Service: Endoscopy;  Laterality: N/A;  . HERNIA REPAIR    . KNEE ARTHROSCOPY WITH MEDIAL MENISECTOMY Left 11/06/2012   Procedure: KNEE ARTHROSCOPY WITH MEDIAL MENISECTOMY;  Surgeon: Carole Civil, MD;  Location: [facility];  Service: Orthopedics;  Laterality: Left;  . Lipoma removal  Stomach  . LUMBAR LAMINECTOMY/DECOMPRESSION MICRODISCECTOMY Left 08/30/2013   Procedure: LUMBAR LAMINECTOMY/DECOMPRESSION MICRODISCECTOMY LEFT  LUMBAR TWO THREE;  Surgeon: Otilio Connors, MD;  Location: [place] [facility];  Service: Neurosurgery;  Laterality: Left;  . SHOULDER SURGERY     Rght-rotator cuff  . TOOTH EXTRACTION      Current Medications: Outpatient Medications Prior to Visit  Medication Sig Dispense Refill  . ACCU-CHEK AVIVA PLUS test strip USE AS DIRECTED TWICE DAILY. 100 each 0  . aspirin EC 81 MG tablet Take 81 mg by mouth daily.    .  B-D ULTRAFINE III SHORT PEN 31G X 8 MM MISC USE AS DIRECTED DAILY WITH LANTUS. 100 each 0  . benazepril (LOTENSIN) 40 MG tablet TAKE ONE TABLET BY MOUTH DAILY. 90 tablet 0  . BESIVANCE 0.6 % SUSP Place 1 drop into both eyes as directed. Completed therapy for left eye 07/15/16  To begin therapy for right eye on 07/19/16 (Three times daily)    . bumetanide (BUMEX) 2 MG tablet TAKE 1&1/2 TABLETS BY MOUTH TWICE DAILY. (Patient taking differently: TAKE 2 MG (1 TABLET) TABLETS BY MOUTH TWICE DAILY.) 135 tablet 3  . BYSTOLIC 10 MG tablet TAKE ONE TABLET BY MOUTH DAILY. 30 tablet 0  . diazepam (VALIUM) 5 MG tablet TAKE 1 TABLET BY MOUTH AT BEDTIME AS NEEDED FOR ANXIETY. 30 tablet 2  . diclofenac sodium (VOLTAREN) 1 % GEL Apply 4 g topically 4 (four) times daily. (Patient taking differently: Apply 4 g topically 4 (four) times daily as needed (pain). ) 5 Tube 5  . DUREZOL 0.05 % EMUL Place 1 drop into both eyes 3 (three) times daily. Patient to continue therapy in LEFT EYE until 08/05/16. Patient to begin therapy course for RIGHT EYE on 07/22/16 following surgery and to continue for 3 weeks    . Ferrous Sulfate (IRON) 325 (65 FE) MG TABS Take 1 tablet by mouth daily.     Marland Kitchen gabapentin (NEURONTIN) 400 MG capsule TAKE ONE CAPSULE BY MOUTH THREE TIMES DAILY. 90 capsule 2  . ibuprofen (ADVIL,MOTRIN) 800 MG tablet Take 800 mg by mouth 3 (three) times daily as needed. For pain    . LANTUS SOLOSTAR 100 UNIT/ML Solostar Pen INJECT 30 UNITS SUBCUTANEOUSLY AT BEDTIME. 15 mL 0  . levothyroxine (SYNTHROID, LEVOTHROID) 88 MCG tablet TAKE 1 TABLET BY MOUTH DAILY BEFORE BREAKFAST. 30 tablet 0  . meloxicam (MOBIC) 15 MG tablet TAKE ONE TABLET BY MOUTH DAILY WITH FOOD. 90 tablet 1  . metFORMIN (GLUCOPHAGE) 1000 MG tablet TAKE ONE TABLET BY MOUTH TWICE DAILY WITH A MEAL. 180 tablet 3  . metolazone (ZAROXOLYN) 2.5 MG tablet TAKE 1 TABLET BY MOUTH DAILY. 30 tablet 3  . nystatin cream (MYCOSTATIN) APPLY TO AFFECTED AREA TWICE DAILY.  30 g 0  . omeprazole (PRILOSEC) 20 MG capsule TAKE ONE CAPSULE BY MOUTH TWICE DAILY. 180 capsule 1  . oxyCODONE-acetaminophen (PERCOCET) 7.5-325 MG tablet Take one tablet by mouth every 6 hours as needed for severe pain. Max APAP 3gm/24hrs from all sources 120 tablet 0  . pravastatin (PRAVACHOL) 40 MG tablet TAKE ONE TABLET BY MOUTH DAILY. (Patient taking differently: TAKE ONE TABLET BY MOUTH DAILY IN THE EVENING) 90 tablet 0  . PROLENSA 0.07 % SOLN Place 1 drop into both eyes as directed. To complete course on left 08/05/16 and to begin therapy for right eye on 07/19/16 (once daily)     No facility-administered medications prior to visit.      Allergies:  Patient has no known allergies.   Social History   Social History  . Marital status: Widowed    Spouse name: N/A  . Number of children: N/A  . Years of education: 36   Social History Main Topics  . Smoking status: Former Smoker    Packs/day: 2.50    Years: 44.00    Types: Cigarettes    Start date: 05/07/1955    Quit date: 10/01/1999  . Smokeless tobacco: Never Used  . Alcohol use No  . Drug use: No  . Sexual activity: Yes    Birth control/ protection: None   Other Topics Concern  . None   Social History Narrative  . None     Family History:  The patient's   family history includes Cancer in his father and mother; Depression in his mother; Diabetes in his brother, brother, father, mother, sister, and sister; Heart disease in his father, mother, sister, sister, and sister; Hyperlipidemia in his father, mother, sister, sister, and sister; Hypertension in his father, mother, sister, sister, and sister; Rectal cancer in his mother.   ROS:   Please see the history of present illness.    Review of Systems  Constitution: Positive for weakness.  HENT: Negative.   Cardiovascular: Positive for dyspnea on exertion and leg swelling.  Respiratory: Negative.   Endocrine: Negative.   Hematologic/Lymphatic: Negative.   Musculoskeletal:  Positive for muscle weakness.  Gastrointestinal: Negative.   Genitourinary: Negative.    All other systems reviewed and are negative.   PHYSICAL EXAM:   VS:  BP 118/62   Pulse 75   Ht 4' 11"  (1.499 m)   Wt 203 lb (92.1 kg)   SpO2 96%   BMI 41.00 kg/m   Physical Exam  GEN: Well nourished, well developed, in no acute distress  Neck: no JVD, carotid bruits, or masses Cardiac:RRR; no murmurs, rubs, or gallops  Respiratory:  Decreased breath sounds but clear to auscultation bilaterally, normal work of breathing GI: soft, nontender, nondistended, + BS Ext: Legs are wrapped with 2-3+ edema up to his knees Psych: euthymic mood, full affect  Wt Readings from Last 3 Encounters:  10/23/16 203 lb (92.1 kg)  10/18/16 201 lb (91.2 kg)  09/11/16 199 lb (90.3 kg)      Studies/Labs Reviewed:   EKG:  EKG is not ordered today.   Recent Labs: 04/10/2016: ALT 18 05/23/2016: TSH 2.79 07/03/2016: Hemoglobin 10.8; Platelets 203 08/08/2016: BUN 31; Creat 1.47; Magnesium 1.3; Potassium 4.1; Sodium 133   Lipid Panel    Component Value Date/Time   CHOL 116 (L) 04/10/2016 1019   TRIG 108 04/10/2016 1019   HDL 39 (L) 04/10/2016 1019   CHOLHDL 3.0 04/10/2016 1019   VLDL 22 04/10/2016 1019   LDLCALC 55 04/10/2016 1019    Additional studies/ records that were reviewed today include:      Jan 2017 Study Conclusions  - Left ventricle: The cavity size was mildly dilated. Wall   thickness was increased in a pattern of mild LVH. Systolic   function was normal. The estimated ejection fraction was in the   range of 50% to 55%. Wall motion was normal; there were no   regional wall motion abnormalities. Doppler parameters are   consistent with abnormal left ventricular relaxation (grade 1   diastolic dysfunction). - Mitral valve: Calcified annulus. Mildly thickened leaflets . - Atrial septum: No defect or patent foramen ovale was identified.       ASSESSMENT:    1. Chronic  diastolic CHF  (congestive heart failure) (HCC)   2. Lymphedema   3. Essential hypertension      PLAN:  In order of problems listed above:  Chronic diastolic CHF weight up 5 or 6 pounds. Suspected secondary to dietary indiscretion. Had a long discussion with him about 2 g sodium diet. No is difficult for him trying to take care of himself on a fixed income and in a wheelchair. Perhaps his aide could help prepare lower sodium meals for him. Advised to take an extra metolazone tomorrow if weight is still up. He did not take his diuretics yet today. Follow-up with Dr. branch in 2 months.  Lymphedema patient completed lymphedema clinic and this has been stable.  Essential hypertension well controlled    Medication Adjustments/Labs and Tests Ordered: Current medicines are reviewed at length with the patient today.  Concerns regarding medicines are outlined above.  Medication changes, Labs and Tests ordered today are listed in the Patient Instructions below. There are no Patient Instructions on file for this visit.   Sumner Boast, PA-C  10/23/2016 11:32 AM    [facility] [address], [place], [place]  [address] Phone: [phone]; Fax: [phone]

## 2016-10-23 NOTE — Patient Instructions (Signed)
Your physician recommends that you schedule a follow-up appointment in: 2 Month with Dr. Harl Bowie  Your physician recommends that you continue on your current medications as directed. Please refer to the Current Medication list given to you today.  Your physician recommends that you have lab work done today.  If you need a refill on your cardiac medications before your next appointment, please call your pharmacy.  Thank you for choosing [facility]!

## 2016-10-24 ENCOUNTER — Other Ambulatory Visit: Payer: Self-pay | Admitting: *Deleted

## 2016-10-24 DIAGNOSIS — I5040 Unspecified combined systolic (congestive) and diastolic (congestive) heart failure: Secondary | ICD-10-CM | POA: Diagnosis not present

## 2016-10-24 DIAGNOSIS — Z7982 Long term (current) use of aspirin: Secondary | ICD-10-CM | POA: Diagnosis not present

## 2016-10-24 DIAGNOSIS — Z4800 Encounter for change or removal of nonsurgical wound dressing: Secondary | ICD-10-CM | POA: Diagnosis not present

## 2016-10-24 DIAGNOSIS — E1140 Type 2 diabetes mellitus with diabetic neuropathy, unspecified: Secondary | ICD-10-CM | POA: Diagnosis not present

## 2016-10-24 DIAGNOSIS — I890 Lymphedema, not elsewhere classified: Secondary | ICD-10-CM | POA: Diagnosis not present

## 2016-10-24 DIAGNOSIS — Z794 Long term (current) use of insulin: Secondary | ICD-10-CM | POA: Diagnosis not present

## 2016-10-24 MED ORDER — METOLAZONE 2.5 MG PO TABS: 2.5000 mg | ORAL_TABLET | Freq: Every day | ORAL | 3 refills | Status: DC

## 2016-10-28 DIAGNOSIS — Z7982 Long term (current) use of aspirin: Secondary | ICD-10-CM | POA: Diagnosis not present

## 2016-10-28 DIAGNOSIS — Z794 Long term (current) use of insulin: Secondary | ICD-10-CM | POA: Diagnosis not present

## 2016-10-28 DIAGNOSIS — I890 Lymphedema, not elsewhere classified: Secondary | ICD-10-CM | POA: Diagnosis not present

## 2016-10-28 DIAGNOSIS — I5040 Unspecified combined systolic (congestive) and diastolic (congestive) heart failure: Secondary | ICD-10-CM | POA: Diagnosis not present

## 2016-10-28 DIAGNOSIS — Z4800 Encounter for change or removal of nonsurgical wound dressing: Secondary | ICD-10-CM | POA: Diagnosis not present

## 2016-10-28 DIAGNOSIS — E1140 Type 2 diabetes mellitus with diabetic neuropathy, unspecified: Secondary | ICD-10-CM | POA: Diagnosis not present

## 2016-10-29 ENCOUNTER — Telehealth: Payer: Self-pay | Admitting: Adult Health

## 2016-10-29 DIAGNOSIS — I5040 Unspecified combined systolic (congestive) and diastolic (congestive) heart failure: Secondary | ICD-10-CM | POA: Diagnosis not present

## 2016-10-29 DIAGNOSIS — Z7982 Long term (current) use of aspirin: Secondary | ICD-10-CM | POA: Diagnosis not present

## 2016-10-29 DIAGNOSIS — I890 Lymphedema, not elsewhere classified: Secondary | ICD-10-CM | POA: Diagnosis not present

## 2016-10-29 DIAGNOSIS — Z794 Long term (current) use of insulin: Secondary | ICD-10-CM | POA: Diagnosis not present

## 2016-10-29 DIAGNOSIS — Z4800 Encounter for change or removal of nonsurgical wound dressing: Secondary | ICD-10-CM | POA: Diagnosis not present

## 2016-10-29 DIAGNOSIS — E1140 Type 2 diabetes mellitus with diabetic neuropathy, unspecified: Secondary | ICD-10-CM | POA: Diagnosis not present

## 2016-10-29 NOTE — Telephone Encounter (Signed)
Patient would like to speak w/ nurse regarding dosage of "fluid pill". Stated that he did not know the name of it. / tg

## 2016-10-30 ENCOUNTER — Other Ambulatory Visit: Payer: Self-pay | Admitting: *Deleted

## 2016-10-30 DIAGNOSIS — Z794 Long term (current) use of insulin: Secondary | ICD-10-CM | POA: Diagnosis not present

## 2016-10-30 DIAGNOSIS — Z4800 Encounter for change or removal of nonsurgical wound dressing: Secondary | ICD-10-CM | POA: Diagnosis not present

## 2016-10-30 DIAGNOSIS — Z7982 Long term (current) use of aspirin: Secondary | ICD-10-CM | POA: Diagnosis not present

## 2016-10-30 DIAGNOSIS — I890 Lymphedema, not elsewhere classified: Secondary | ICD-10-CM | POA: Diagnosis not present

## 2016-10-30 DIAGNOSIS — I5040 Unspecified combined systolic (congestive) and diastolic (congestive) heart failure: Secondary | ICD-10-CM | POA: Diagnosis not present

## 2016-10-30 DIAGNOSIS — E1140 Type 2 diabetes mellitus with diabetic neuropathy, unspecified: Secondary | ICD-10-CM | POA: Diagnosis not present

## 2016-10-30 NOTE — Patient Outreach (Signed)
[facility]) Care Management  10/30/2016  AQEEL NORGAARD 04/02/1946 [id number]  I returned a call to Mr. Angelino in response to a message he left this morning requesting that I call him. I did not reach Mr. Quintela but left a HIPPA compliant message requesting a return call.    [facility] Management  [phone]

## 2016-11-01 ENCOUNTER — Other Ambulatory Visit: Payer: Self-pay | Admitting: *Deleted

## 2016-11-01 NOTE — Patient Outreach (Signed)
[facility]) Care Management  11/01/2016  ADALID BECKMANN [birth year] [id number]   FERNANDEZ KENLEY is an 71 y.o. male with chronic diastolic heart failure with EF 60-65%, history of bradycardia, type II Diabetes, Hypertension, Iron Deficiency Anemia, NASH + Hepatomegaly, and degeneratove disc disease s/p surgery of L2-L3.   [facility] Care Management has been following Mr. Kissling in the community for several months for care coordination and education around CHF and DM Disease management with particular attention to volume management and peripheral edema/lymphedema and lower extremity skin ailments.  Hypertension Management - Mr. Wamser has hypertension. He takes his medications as prescribed. Over the last month, his HHRN found his bp to lower than his baseline. Mr. Treese decreased his Bystolic dose from 66ZL qd to 30m qd as per instruction from Dr. PDennard Schaumann beginning 10/07/16.   I followed up with Mr. GHaymerat the request of Dr. DBuelah Manis His blood pressure has been stable and consistent with baseline over the last 2-3 weeks. Mr. GSpinnatodenies chest pain, shortness of breath, dizziness, lightheadedness, nausea, or other new or worsened cardiovascular symptom.   Blood pressure readings:  10/23/16: 118/62 10/17/16: 112/70 10/14/16: 110/80 10/11/16: 120/80 10/10/16:  108/62  10/09/16:  98/64  10/05/16:  122/80   CHF Management - Mr. GHarbesonis taking all medications as prescribed; he is weighing daily; he admits to occasional dietary non adherence - I reviewed with him his prescribed low sodium, heart healthy, carb modified diet and provided educational materials for additional support; Mr. GFangmanweight has been between 198lb-202lb over the last 2 weeks per his report and findings from the medical record from his cardiology office visit; Mr. GWackerdenies worsening shortness of breath or abdominal swelling or peripheral edema  Weight:  10/31/16 = 202lb 10/23/16 = 203lb 10/18/16 = 201lb  (after additional dose of Metolazone) 10/17/16 = 205lb 10/10/16 = 193lb   Chronic Intermittent Lower Extremity Lymphedema/Skin conditions - Mr. GCardozocompleted a course [facility] a few [facility] ago; he wears compression hose as prescribed; he also completed a course of HHPT for conditioning and strengthening; his CNA who helps with his bath has been trained to examine his skin daily; today, Mr. GGiannottireports that he has "the same" swelling (typically 1+ pre-tibial edema and 1+ edema to just above the ankles) and feels the skin on his legs "looks great"; he knows to call for any sign of peripheral edema or interruption in skin integrity  Assessment:  Mr. GUzzleis a 71year old gentleman living [place] He has history of chronic diastolic heart failure, bradycardia, DM, and recurrent lower extremity edema and lymphedema w/ complicating skin conditions.   Mr. GAbdulazizis currently managing his chronic health conditions well, is taking his medications as prescribed, and is attending scheduled provider appointments. We reviewed his medications today and his upcoming provider appointment schedule.   Mr. GKrutzwill continue to take his medications as prescribed until given further instructions. He will call if he has new or worsened symptoms and will call 911 if he has chest pain, shortness of breath, dizziness/lightheadedness. He will continue to monitor his blood pressure closely/daily for now because of the recent concerns about hypotension.   I will follow up with Mr. GWichmannby phone in a few weeks. If he continues to do well, I'll keep him on a telephone assessment schedule for one month more and then transition him to our telephonic case management/health coaching program.  [facility]  [phone]

## 2016-11-02 DIAGNOSIS — L89312 Pressure ulcer of right buttock, stage 2: Secondary | ICD-10-CM | POA: Diagnosis not present

## 2016-11-02 DIAGNOSIS — R809 Proteinuria, unspecified: Secondary | ICD-10-CM | POA: Diagnosis not present

## 2016-11-02 DIAGNOSIS — I890 Lymphedema, not elsewhere classified: Secondary | ICD-10-CM | POA: Diagnosis not present

## 2016-11-02 DIAGNOSIS — R269 Unspecified abnormalities of gait and mobility: Secondary | ICD-10-CM | POA: Diagnosis not present

## 2016-11-02 DIAGNOSIS — R609 Edema, unspecified: Secondary | ICD-10-CM | POA: Diagnosis not present

## 2016-11-04 ENCOUNTER — Telehealth: Payer: Self-pay | Admitting: *Deleted

## 2016-11-04 MED ORDER — OXYCODONE-ACETAMINOPHEN 7.5-325 MG PO TABS: ORAL_TABLET | ORAL | 0 refills | Status: DC

## 2016-11-04 NOTE — Telephone Encounter (Signed)
Received call from patient.    Requested refill on Oxycodone.   Ok to refill??  Last office visit 07/30/2017.  Last refill 10/09/2016.

## 2016-11-04 NOTE — Telephone Encounter (Signed)
okay

## 2016-11-04 NOTE — Telephone Encounter (Signed)
Prescription printed and patient made aware to come to office to pick up on 11/05/2016.

## 2016-11-05 DIAGNOSIS — Z7982 Long term (current) use of aspirin: Secondary | ICD-10-CM | POA: Diagnosis not present

## 2016-11-05 DIAGNOSIS — Z4800 Encounter for change or removal of nonsurgical wound dressing: Secondary | ICD-10-CM | POA: Diagnosis not present

## 2016-11-05 DIAGNOSIS — E1140 Type 2 diabetes mellitus with diabetic neuropathy, unspecified: Secondary | ICD-10-CM | POA: Diagnosis not present

## 2016-11-05 DIAGNOSIS — I5040 Unspecified combined systolic (congestive) and diastolic (congestive) heart failure: Secondary | ICD-10-CM | POA: Diagnosis not present

## 2016-11-05 DIAGNOSIS — I890 Lymphedema, not elsewhere classified: Secondary | ICD-10-CM | POA: Diagnosis not present

## 2016-11-05 DIAGNOSIS — Z794 Long term (current) use of insulin: Secondary | ICD-10-CM | POA: Diagnosis not present

## 2016-11-08 ENCOUNTER — Other Ambulatory Visit: Payer: Self-pay | Admitting: *Deleted

## 2016-11-08 MED ORDER — BLOOD GLUCOSE SYSTEM PAK KIT: PACK | 3 refills | Status: DC

## 2016-11-08 MED ORDER — GLUCOSE BLOOD VI STRP: ORAL_STRIP | 3 refills | Status: DC

## 2016-11-15 DIAGNOSIS — I890 Lymphedema, not elsewhere classified: Secondary | ICD-10-CM | POA: Diagnosis not present

## 2016-11-15 DIAGNOSIS — Z4800 Encounter for change or removal of nonsurgical wound dressing: Secondary | ICD-10-CM | POA: Diagnosis not present

## 2016-11-15 DIAGNOSIS — Z794 Long term (current) use of insulin: Secondary | ICD-10-CM | POA: Diagnosis not present

## 2016-11-15 DIAGNOSIS — I5040 Unspecified combined systolic (congestive) and diastolic (congestive) heart failure: Secondary | ICD-10-CM | POA: Diagnosis not present

## 2016-11-15 DIAGNOSIS — Z7982 Long term (current) use of aspirin: Secondary | ICD-10-CM | POA: Diagnosis not present

## 2016-11-15 DIAGNOSIS — E1140 Type 2 diabetes mellitus with diabetic neuropathy, unspecified: Secondary | ICD-10-CM | POA: Diagnosis not present

## 2016-11-17 ENCOUNTER — Other Ambulatory Visit: Payer: Self-pay | Admitting: Family Medicine

## 2016-11-18 DIAGNOSIS — H26491 Other secondary cataract, right eye: Secondary | ICD-10-CM | POA: Diagnosis not present

## 2016-11-18 DIAGNOSIS — Z961 Presence of intraocular lens: Secondary | ICD-10-CM | POA: Diagnosis not present

## 2016-11-18 DIAGNOSIS — H26493 Other secondary cataract, bilateral: Secondary | ICD-10-CM | POA: Diagnosis not present

## 2016-11-18 DIAGNOSIS — E119 Type 2 diabetes mellitus without complications: Secondary | ICD-10-CM | POA: Diagnosis not present

## 2016-11-19 ENCOUNTER — Telehealth: Payer: Self-pay | Admitting: *Deleted

## 2016-11-19 DIAGNOSIS — Z7982 Long term (current) use of aspirin: Secondary | ICD-10-CM | POA: Diagnosis not present

## 2016-11-19 DIAGNOSIS — Z794 Long term (current) use of insulin: Secondary | ICD-10-CM | POA: Diagnosis not present

## 2016-11-19 DIAGNOSIS — I5040 Unspecified combined systolic (congestive) and diastolic (congestive) heart failure: Secondary | ICD-10-CM | POA: Diagnosis not present

## 2016-11-19 DIAGNOSIS — I890 Lymphedema, not elsewhere classified: Secondary | ICD-10-CM | POA: Diagnosis not present

## 2016-11-19 DIAGNOSIS — E1140 Type 2 diabetes mellitus with diabetic neuropathy, unspecified: Secondary | ICD-10-CM | POA: Diagnosis not present

## 2016-11-19 DIAGNOSIS — Z4800 Encounter for change or removal of nonsurgical wound dressing: Secondary | ICD-10-CM | POA: Diagnosis not present

## 2016-11-19 MED ORDER — LEVOTHYROXINE SODIUM 88 MCG PO TABS: ORAL_TABLET | ORAL | 0 refills | Status: DC

## 2016-11-19 MED ORDER — CARVEDILOL 3.125 MG PO TABS: 3.1250 mg | ORAL_TABLET | Freq: Two times a day (BID) | ORAL | 3 refills | Status: DC

## 2016-11-19 NOTE — Telephone Encounter (Signed)
Received call from patient.   Reports that he is no longer receiving Levothyroxine from [place], therefore requesting refill to be sent to [facility]. Prescription sent to pharmacy.   Also reports that he cannot afford Bystolic. Current co-pay is $135. MD please advise.

## 2016-11-19 NOTE — Telephone Encounter (Signed)
Prescription sent to pharmacy. .   Call placed to patient and patient made aware.

## 2016-11-19 NOTE — Telephone Encounter (Signed)
Change to Coreg 3.140m BID instead

## 2016-11-20 ENCOUNTER — Ambulatory Visit (INDEPENDENT_AMBULATORY_CARE_PROVIDER_SITE_OTHER): Payer: Medicare HMO | Admitting: Family Medicine

## 2016-11-20 ENCOUNTER — Encounter: Payer: Self-pay | Admitting: Family Medicine

## 2016-11-20 VITALS — BP 122/68 | HR 80 | Temp 98.6°F | Resp 20 | Wt 191.0 lb

## 2016-11-20 DIAGNOSIS — I890 Lymphedema, not elsewhere classified: Secondary | ICD-10-CM

## 2016-11-20 DIAGNOSIS — E038 Other specified hypothyroidism: Secondary | ICD-10-CM

## 2016-11-20 DIAGNOSIS — I10 Essential (primary) hypertension: Secondary | ICD-10-CM

## 2016-11-20 DIAGNOSIS — E1149 Type 2 diabetes mellitus with other diabetic neurological complication: Secondary | ICD-10-CM

## 2016-11-20 DIAGNOSIS — M5137 Other intervertebral disc degeneration, lumbosacral region: Secondary | ICD-10-CM | POA: Diagnosis not present

## 2016-11-20 MED ORDER — OXYCODONE-ACETAMINOPHEN 7.5-325 MG PO TABS: ORAL_TABLET | ORAL | 0 refills | Status: DC

## 2016-11-20 NOTE — Progress Notes (Signed)
   Subjective:    Patient ID: Michael [name], male    DOB: 1946-07-16, 71 y.o.   MRN: [id number]  Patient presents for Follow-up (is not fasting)    DM- last A1C 6.2%, currently on lantus 30units, Had A1C 5.1% recently at [facility] on statin drug CBG now 90-110, feels a little weak at 90  Diastolic CHF- was up to 131YHO , increased diuretic and now down to 191lbs, breathing is good   [facility]- [facility], [facility] get [facility]  Chronic pain- still taking pain meds as prescribed   Had High uric acid, [facility]    Review Of Systems:  GEN- denies fatigue, fever, weight loss,weakness, recent illness HEENT- denies eye drainage, change in vision, nasal discharge, CVS- denies chest pain, palpitations RESP- denies SOB, cough, wheeze ABD- denies N/V, change in stools, abd pain GU- denies dysuria, hematuria, dribbling, incontinence MSK- + joint pain, muscle aches, injury Neuro- denies headache, dizziness, syncope, seizure activity       Objective:    BP 122/68   Pulse 80   Temp 98.6 F (37 C) (Oral)   Resp 20   Wt 191 lb (86.6 kg) Comment: patient reported  SpO2 99%   BMI 38.58 kg/m  GEN- NAD, alert and oriented x3 HEENT- PERRL, EOMI, non injected sclera, pink conjunctiva, MMM, oropharynx clear Neck- Supple,  CVS- RRR, no murmur RESP-CTAB ABD-NABS,soft,NT,ND EXT- chronic lymph edema (improved) Pulses- Radial 2+, DP- decreased but present +        Assessment & Plan:      Problem List Items Addressed This Visit    Type II diabetes mellitus with neurological manifestations (HCC) - Primary    Diabetes overcorrected, decrease lantus to 20units, want to avoid hypoglycemia symptoms Will attempt to get labs from [place]  Continue statin      OBESITY, MORBID   Lymphedema    Currently improved, keeping weight down with diuretics per cardiology  Elevating legs, compression hose therapy      Hypothyroidism   Continue current dose of synthroid      Essential hypertension    Controlled no changes       DDD (degenerative disc disease), lumbosacral    Pain medication refilled for next month       Relevant Medications   oxyCODONE-acetaminophen (PERCOCET) 7.5-325 MG tablet      Note: This dictation was prepared with Dragon dictation along with smaller phrase technology. Any transcriptional errors that result from this process are unintentional.

## 2016-11-20 NOTE — Patient Instructions (Addendum)
F/U 3 months  Decrease Lantus to  20units

## 2016-11-21 ENCOUNTER — Encounter: Payer: Self-pay | Admitting: Family Medicine

## 2016-11-21 ENCOUNTER — Other Ambulatory Visit: Payer: Self-pay | Admitting: *Deleted

## 2016-11-21 MED ORDER — ACCU-CHEK AVIVA PLUS W/DEVICE KIT: PACK | 1 refills | Status: DC

## 2016-11-21 MED ORDER — GLUCOSE BLOOD VI STRP: ORAL_STRIP | 1 refills | Status: DC

## 2016-11-21 NOTE — Assessment & Plan Note (Signed)
Pain medication refilled for next month

## 2016-11-21 NOTE — Assessment & Plan Note (Signed)
Currently improved, keeping weight down with diuretics per cardiology  Elevating legs, compression hose therapy

## 2016-11-21 NOTE — Assessment & Plan Note (Signed)
Controlled no changes

## 2016-11-21 NOTE — Assessment & Plan Note (Signed)
Diabetes overcorrected, decrease lantus to 20units, want to avoid hypoglycemia symptoms Will attempt to get labs from [facility] statin

## 2016-11-21 NOTE — Assessment & Plan Note (Signed)
Continue current dose of synthroid

## 2016-11-22 ENCOUNTER — Ambulatory Visit: Payer: Self-pay | Admitting: *Deleted

## 2016-11-25 ENCOUNTER — Other Ambulatory Visit: Payer: Self-pay | Admitting: *Deleted

## 2016-11-25 NOTE — Patient Outreach (Addendum)
[facility]) Care Management  11/25/2016  AMERICUS PERKEY October 31, 1945 [id number]   Evern Core Goodwinis an 71 y.o.malewith chronic diastolic heart failure with EF 60-65%, history of bradycardia, type II Diabetes, Hypertension, Iron Deficiency Anemia, NASH + Hepatomegaly, and degeneratove disc disease s/p surgery of L2-L3.   [facility] Care Management has been following Mr. Akkerman in the community for several months for care coordination and education around CHF and DM Disease management with particular attention to volume management and peripheral edema/lymphedema and lower extremity skin ailments.   Hypertension Management - Mr. Staup has hypertension. He takes his medications as prescribed. His blood pressure has been stable and consistent with baseline over the last 2 months. Mr. Welcher denies chest pain, shortness of breath, dizziness, lightheadedness, nausea, or other new or worsened cardiovascular symptom. He has been provided education about low sodium, heart healthy diet both by me and at the cardiology office.   Blood pressure readings: 11/21/16:  122/68 10/23/16: 118/62 10/17/16: 112/70 10/14/16: 110/80 10/11/16: 120/80 10/10/16: 108/62  10/09/16: 98/64  10/05/16: 122/80   CHF Management - Mr. Gillson is taking all medications as prescribed; he is weighing daily; he admits to occasional dietary non adherence - I reviewed with him his prescribed low sodium, heart healthy, carb modified diet and provided educational materials for additional support; Mr. Biegler weight was 191 at Dr. Dorian Heckle office last week. Mr. Shorey denies worsening shortness of breath or abdominal swelling or peripheral edema  Weight:  11/21/16 = 191 10/31/16 = 202lb 10/23/16 = 203lb 10/18/16 = 201lb(after additional dose of Metolazone) 10/17/16 = 205lb 10/10/16 = 193lb   Chronic Intermittent Lower Extremity Lymphedema/Skin conditions - Mr. Ndiaye completed a course of lymphedema therapy at  [facility] a few months ago; he wears compression hose as prescribed; he also completed a course of HHPT for conditioning and strengthening; his CNA who helps with his bath has been trained to examine his skin daily; he knows to call for any sign of peripheral edema or interruption in skin integrity   Chronic Health Condition (DMII) - Mr. Suto takes his medications as prescribed and checks cbg's as recommended; his last HgA1C was 5.1 [facility]); Dr. Buelah Manis decreased his Lantus dose from 30u to 20u to address some cbg's in the 90's that were leaving Mr. Pipe feeling weak; [name] is continually working on improvement to his carb modified, heart healthy, low sodium diet.    Assessment: Mr. Stockley is a 71 year old gentleman living alone in [place]. He has history of chronic diastolic heart failure, bradycardia, DM, and recurrent lower extremity edema and lymphedema w/ complicating skin conditions. He has met ALL case management goals and is doing exceptionally well.   Mr. Roy is currently managing his chronic health conditions well, is taking his medications as prescribed, and is attending scheduled provider appointments.   Mr. Plaskett will continue to take his medications as prescribed until given further instructions. He will call if he has new or worsened symptoms and will call 911 if he has chest pain, shortness of breath, dizziness/lightheadedness.   I will close Mr. Maye case to active case management services. He knows we are available and he can call at any time if in need of assistance.    [facility] Management  [phone]

## 2016-11-27 ENCOUNTER — Ambulatory Visit: Payer: Medicare Other | Admitting: Family Medicine

## 2016-11-28 DIAGNOSIS — Z7982 Long term (current) use of aspirin: Secondary | ICD-10-CM | POA: Diagnosis not present

## 2016-11-28 DIAGNOSIS — I5040 Unspecified combined systolic (congestive) and diastolic (congestive) heart failure: Secondary | ICD-10-CM | POA: Diagnosis not present

## 2016-11-28 DIAGNOSIS — E1140 Type 2 diabetes mellitus with diabetic neuropathy, unspecified: Secondary | ICD-10-CM | POA: Diagnosis not present

## 2016-11-28 DIAGNOSIS — I890 Lymphedema, not elsewhere classified: Secondary | ICD-10-CM | POA: Diagnosis not present

## 2016-11-28 DIAGNOSIS — Z4800 Encounter for change or removal of nonsurgical wound dressing: Secondary | ICD-10-CM | POA: Diagnosis not present

## 2016-11-28 DIAGNOSIS — Z794 Long term (current) use of insulin: Secondary | ICD-10-CM | POA: Diagnosis not present

## 2016-11-30 DIAGNOSIS — I890 Lymphedema, not elsewhere classified: Secondary | ICD-10-CM | POA: Diagnosis not present

## 2016-11-30 DIAGNOSIS — R609 Edema, unspecified: Secondary | ICD-10-CM | POA: Diagnosis not present

## 2016-12-13 ENCOUNTER — Encounter: Payer: Self-pay | Admitting: Cardiology

## 2016-12-13 ENCOUNTER — Other Ambulatory Visit (HOSPITAL_COMMUNITY)
Admission: RE | Admit: 2016-12-13 | Discharge: 2016-12-13 | Disposition: A | Discharge status: Home / Self Care | Payer: Medicare Other | Source: Ambulatory Visit | Attending: Cardiology | Admitting: Cardiology

## 2016-12-13 ENCOUNTER — Ambulatory Visit (INDEPENDENT_AMBULATORY_CARE_PROVIDER_SITE_OTHER): Payer: Medicare Other | Admitting: Cardiology

## 2016-12-13 ENCOUNTER — Telehealth: Payer: Self-pay | Admitting: *Deleted

## 2016-12-13 VITALS — BP 118/72 | HR 86 | Ht 59.0 in | Wt 186.0 lb

## 2016-12-13 DIAGNOSIS — I5032 Chronic diastolic (congestive) heart failure: Secondary | ICD-10-CM | POA: Diagnosis not present

## 2016-12-13 DIAGNOSIS — I10 Essential (primary) hypertension: Secondary | ICD-10-CM | POA: Diagnosis not present

## 2016-12-13 DIAGNOSIS — Z79899 Other long term (current) drug therapy: Secondary | ICD-10-CM | POA: Insufficient documentation

## 2016-12-13 DIAGNOSIS — I499 Cardiac arrhythmia, unspecified: Secondary | ICD-10-CM | POA: Diagnosis not present

## 2016-12-13 LAB — BASIC METABOLIC PANEL
Anion gap: 11 (ref 5–15)
BUN: 35 mg/dL — ABNORMAL HIGH (ref 6–20)
CHLORIDE: 88 mmol/L — AB (ref 101–111)
CO2: 28 mmol/L (ref 22–32)
Calcium: 8.9 mg/dL (ref 8.9–10.3)
Creatinine, Ser: 1.19 mg/dL (ref 0.61–1.24)
GFR calc non Af Amer: >60 mL/min (ref >60)
Glucose, Bld: 121 mg/dL — ABNORMAL HIGH (ref 65–99)
POTASSIUM: 4 mmol/L (ref 3.5–5.1)
SODIUM: 127 mmol/L — AB (ref 135–145)

## 2016-12-13 LAB — MAGNESIUM: MAGNESIUM: 1.4 mg/dL — AB (ref 1.7–2.4)

## 2016-12-13 MED ORDER — NYSTATIN 100000 UNIT/GM EX CREA: TOPICAL_CREAM | CUTANEOUS | 0 refills | Status: DC

## 2016-12-13 NOTE — Telephone Encounter (Signed)
Received VM from patient requesting refill on Nystatin.   Prescription sent to pharmacy.

## 2016-12-13 NOTE — Patient Instructions (Signed)
Medication Instructions:  Your physician recommends that you continue on your current medications as directed. Please refer to the Current Medication list given to you today.   Labwork: Today BMET MAGNESIUM  Testing/Procedures: NONE  Follow-Up: Your physician recommends that you schedule a follow-up appointment in: 6 MONTHS    Any Other Special Instructions Will Be Listed Below (If Applicable).     If you need a refill on your cardiac medications before your next appointment, please call your pharmacy.

## 2016-12-13 NOTE — Progress Notes (Signed)
Clinical Summary Mr. Titsworth is a 71 y.o.male seen today for follow up of the following medical problems.   1. Chronic diastolic heart failure - echo 05/2015 with normal LVEF 60-65%, grade II diastolic dysfunction - Jan 2017 echo LVEF 54-49%, grade I diastolic dysfunction - edema likely combined diastolic HF and lymphedema    - seen in clinic Jan 2018 by PA Bonnell Public, he had 5 lbs weight gain. He was to take additional metolazone. Weights not back to baseline - home weights around 186 lbs and stable. He is on bumex 36m bid, metolazone as needed - swelling is doing well.    Past Medical History:  Diagnosis Date  . Arthritis   . [place]ive heart disease [place] 11[place]  . COPD ([place] obstructive pulmonary disease) ([place]   . DDD (degenerative disc disease), lumbosacral   . Depression   . Diabetic neuropathy ([place]   . Diastolic dysfunction   . Essential hypertension   . GERD (gastroesophageal reflux disease)   . Hyperlipidemia   . Iron deficiency anemia   . Left knee DJD   . Lumbar spinal stenosis   . Lymphedema 11/2015   BOTH LEGS  . NASH (nonalcoholic steatohepatitis)   . Peripheral edema   . Peripheral edema 10/05/2015  . Type 2 diabetes mellitus (HCC)      No Known Allergies   Current Outpatient Prescriptions  Medication Sig Dispense Refill  . aspirin EC 81 MG tablet Take 81 mg by mouth daily.    . B-D ULTRAFINE III SHORT PEN 31G X 8 MM MISC USE AS DIRECTED DAILY WITH LANTUS. 100 each 0  . benazepril (LOTENSIN) 40 MG tablet TAKE ONE TABLET BY MOUTH DAILY. 90 tablet 0  . BESIVANCE 0.6 % SUSP Place 1 drop into both eyes as directed. Completed therapy for left eye 07/15/16  To begin therapy for right eye on 07/19/16 (Three times daily)    . Blood Glucose Monitoring Suppl (ACCU-CHEK AVIVA PLUS) w/Device KIT Use as directed to monitor FSBS 2x daily. Dx: E11.9. 1 kit 1  . bumetanide (BUMEX) 2 MG tablet TAKE 1&1/2 TABLETS BY MOUTH TWICE DAILY. (Patient taking  differently: TAKE 2 MG (1 TABLET) TABLETS BY MOUTH TWICE DAILY.) 135 tablet 3  . carvedilol (COREG) 3.125 MG tablet Take 1 tablet (3.125 mg total) by mouth 2 (two) times daily with a meal. 60 tablet 3  . diazepam (VALIUM) 5 MG tablet TAKE 1 TABLET BY MOUTH AT BEDTIME AS NEEDED FOR ANXIETY. 30 tablet 2  . diclofenac sodium (VOLTAREN) 1 % GEL Apply 4 g topically 4 (four) times daily. (Patient taking differently: Apply 4 g topically 4 (four) times daily as needed (pain). ) 5 Tube 5  . DUREZOL 0.05 % EMUL Place 1 drop into both eyes 3 (three) times daily. Patient to continue therapy in LEFT EYE until 08/05/16. Patient to begin therapy course for RIGHT EYE on 07/22/16 following surgery and to continue for 3 weeks    . Ferrous Sulfate (IRON) 325 (65 FE) MG TABS Take 1 tablet by mouth daily.     .Marland Kitchengabapentin (NEURONTIN) 400 MG capsule TAKE ONE CAPSULE BY MOUTH THREE TIMES DAILY. 90 capsule 2  . glucose blood (ACCU-CHEK AVIVA PLUS) test strip Use as directed to monitor FSBS 2x daily. Dx: E11.9. 150 each 1  . ibuprofen (ADVIL,MOTRIN) 800 MG tablet Take 800 mg by mouth 3 (three) times daily as needed. For pain    . LANTUS SOLOSTAR 100 UNIT/ML Solostar Pen INJECT 30  UNITS SUBCUTANEOUSLY AT BEDTIME. 15 mL 0  . levothyroxine (SYNTHROID, LEVOTHROID) 88 MCG tablet TAKE 1 TABLET BY MOUTH DAILY BEFORE BREAKFAST. 30 tablet 0  . meloxicam (MOBIC) 15 MG tablet TAKE ONE TABLET BY MOUTH DAILY WITH FOOD. 90 tablet 1  . metFORMIN (GLUCOPHAGE) 1000 MG tablet TAKE ONE TABLET BY MOUTH TWICE DAILY WITH A MEAL. 180 tablet 3  . metolazone (ZAROXOLYN) 2.5 MG tablet Take 1 tablet (2.5 mg total) by mouth daily. 30 tablet 3  . nystatin cream (MYCOSTATIN) APPLY TO AFFECTED AREA TWICE DAILY. 30 g 0  . omeprazole (PRILOSEC) 20 MG capsule TAKE ONE CAPSULE BY MOUTH TWICE DAILY. 180 capsule 1  . oxyCODONE-acetaminophen (PERCOCET) 7.5-325 MG tablet Take one tablet by mouth every 6 hours as needed for severe pain. Max APAP 3gm/24hrs from all  sources 120 tablet 0  . pravastatin (PRAVACHOL) 40 MG tablet TAKE ONE TABLET BY MOUTH DAILY. (Patient taking differently: TAKE ONE TABLET BY MOUTH DAILY IN THE EVENING) 90 tablet 0  . PROLENSA 0.07 % SOLN Place 1 drop into both eyes as directed. To complete course on left 08/05/16 and to begin therapy for right eye on 07/19/16 (once daily)     No current facility-administered medications for this visit.      Past Surgical History:  Procedure Laterality Date  . BIOPSY N/A 07/21/2014   Procedure: BIOPSY;  Surgeon: Daneil Dolin, MD;  Location: [facility];  Service: Endoscopy;  Laterality: N/A;  . CATARACT EXTRACTION W/PHACO Right 07/08/2016   Procedure: CATARACT EXTRACTION PHACO AND INTRAOCULAR LENS PLACEMENT RIGHT EYE;  Surgeon: Tonny , MD;  Location: [facility];  Service: Ophthalmology;  Laterality: Right;  CDE: 9.01  . CATARACT EXTRACTION W/PHACO Left 07/22/2016   Procedure: CATARACT EXTRACTION PHACO AND INTRAOCULAR LENS PLACEMENT LEFT EYE CDE=10.69;  Surgeon: Tonny , MD;  Location: [facility];  Service: Ophthalmology;  Laterality: Left;  left -   . COLONOSCOPY  2011   [facility]: normal colon, normal distal ileum  . COLONOSCOPY WITH PROPOFOL N/A 07/21/2014   Procedure: ATTEMPTED COLONOSCOPY WITH PROPOFOL-HAD TO STOP DUE TO BRADYCARDIA;  Surgeon: Daneil Dolin, MD;  Location: [facility];  Service: Endoscopy;  Laterality: N/A;  . EGD with enteroscopy  2011   [facility]: normal esophagus and stomach. Normal duodenum, jejunum. No evidence of AVMs.   . ESOPHAGOGASTRODUODENOSCOPY (EGD) WITH PROPOFOL N/A 07/21/2014   Procedure: ESOPHAGOGASTRODUODENOSCOPY (EGD) WITH PROPOFOL;  Surgeon: Daneil Dolin, MD;  Location: [facility];  Service: Endoscopy;  Laterality: N/A;  . HERNIA REPAIR    . KNEE ARTHROSCOPY WITH MEDIAL MENISECTOMY Left 11/06/2012   Procedure: KNEE ARTHROSCOPY WITH MEDIAL MENISECTOMY;  Surgeon: Carole Civil, MD;  Location: [facility];  Service: Orthopedics;  Laterality: Left;  . Lipoma removal       Stomach  . LUMBAR LAMINECTOMY/DECOMPRESSION MICRODISCECTOMY Left 08/30/2013   Procedure: LUMBAR LAMINECTOMY/DECOMPRESSION MICRODISCECTOMY LEFT  LUMBAR TWO THREE;  Surgeon: Otilio Connors, MD;  Location: [place] [facility];  Service: Neurosurgery;  Laterality: Left;  . SHOULDER SURGERY     Rght-rotator cuff  . TOOTH EXTRACTION       No Known Allergies    Family History  Problem Relation Age of Onset  . Heart disease Mother   . Hyperlipidemia Mother   . Hypertension Mother   . Depression Mother   . Diabetes Mother   . Rectal cancer Mother   . Cancer Mother   . Heart disease Father   . Hypertension Father   . Hyperlipidemia  Father   . Diabetes Father   . Cancer Father   . Heart disease Sister   . Hyperlipidemia Sister   . Hypertension Sister   . Diabetes Sister   . Diabetes Brother   . Heart disease Sister   . Hyperlipidemia Sister   . Hypertension Sister   . Heart disease Sister   . Hyperlipidemia Sister   . Hypertension Sister   . Diabetes Sister   . Diabetes Brother      Social History Mr. Antonini reports that he quit smoking about 17 years ago. His smoking use included Cigarettes. He started smoking about 61 years ago. He has a 110.00 pack-year smoking history. He has never used smokeless tobacco. Mr. Lysaght reports that he does not drink alcohol.   Review of Systems CONSTITUTIONAL: No weight loss, fever, chills, weakness or fatigue.  HEENT: Eyes: No visual loss, blurred vision, double vision or yellow sclerae.No hearing loss, sneezing, congestion, runny nose or sore throat.  SKIN: No rash or itching.  CARDIOVASCULAR: per hpi RESPIRATORY: No shortness of breath, cough or sputum.  GASTROINTESTINAL: No anorexia, nausea, vomiting or diarrhea. No abdominal pain or blood.  GENITOURINARY: No burning on urination, no polyuria NEUROLOGICAL: No headache, dizziness, syncope, paralysis, ataxia, numbness or tingling in the extremities. No change in bowel or bladder control.   MUSCULOSKELETAL: No muscle, back pain, joint pain or stiffness.  LYMPHATICS: No enlarged nodes. No history of splenectomy.  PSYCHIATRIC: No history of depression or anxiety.  ENDOCRINOLOGIC: No reports of sweating, cold or heat intolerance. No polyuria or polydipsia.  Marland Kitchen   Physical Examination Vitals:   12/13/16 0924  BP: 118/72  Pulse: 86   Vitals:   12/13/16 0924  Weight: 186 lb (84.4 kg)  Height: _0  (1.499 m)    Gen: resting comfortably, no acute distress HEENT: no scleral icterus, pupils equal round and reactive, no palptable cervical adenopathy,  CV: irreg, no m/r/g, no jvd Resp: Clear to auscultation bilaterally GI: abdomen is soft, non-tender, non-distended, normal bowel sounds, no hepatosplenomegaly MSK: extremities are warm, no edema.  Skin: warm, no rash Neuro:  no focal deficits Psych: appropriate affect   Diagnostic Studies 05/2015 echo Study Conclusions  - Left ventricle: The cavity size was normal. Wall thickness was increased in a pattern of mild LVH. Systolic function was normal. The estimated ejection fraction was in the range of 60% to 65%. Wall motion was normal; there were no regional wall motion abnormalities. Features are consistent with a pseudonormal left ventricular filling pattern, with concomitant abnormal relaxation and increased filling pressure (grade 2 diastolic dysfunction). Doppler parameters are consistent with high ventricular filling pressure. - Aortic valve: Mildly calcified annulus. - Mitral valve: Mildly calcified annulus. There was trivial regurgitation. - Left atrium: The atrium was mildly to moderately dilated. Volume/bsa, S: 34.4 ml/m^2. Volume/bsa, ES (1-plane Simpson&'s, A4C): 31.3 ml/m^2.  Jan 2017 Study Conclusions  - Left ventricle: The cavity size was mildly dilated. Wall thickness was increased in a pattern of mild LVH. Systolic function was normal. The estimated ejection fraction  was in the range of 50% to 55%. Wall motion was normal; there were no regional wall motion abnormalities. Doppler parameters are consistent with abnormal left ventricular relaxation (grade 1 diastolic dysfunction). - Mitral valve: Calcified annulus. Mildly thickened leaflets . - Atrial septum: No defect or patent foramen ovale was identified.      Assessment and Plan  1. LE edema/Chronic diastolic HF - likely combination of lymphedema and acute on chronic diastolic  HF - currently on bumex 68m bid and metolazone 2.542mprn - appears euvolemic, weights are stable - repeat labs today, BMET and Mg.   2. Irregular heart beat - EKG shows SR and PVCs. Aysmptomatic. F/u K and Mg     JoArnoldo LenisM.D.

## 2016-12-16 ENCOUNTER — Telehealth: Payer: Self-pay

## 2016-12-16 DIAGNOSIS — Z79899 Other long term (current) drug therapy: Secondary | ICD-10-CM

## 2016-12-16 NOTE — Telephone Encounter (Signed)
Pt made aware. He voiced understanding.

## 2016-12-16 NOTE — Telephone Encounter (Signed)
-----   Message from [name], MD sent at 12/16/2016  3:18 PM EDT ----- Labs show sodium and magnesium are low. Have him lower his bumex to 74m in AM and 236min PM. Watch his weight very closely with this change, and if increased can go back to bumex 57m8mid for a few days. Continue to limit metolazone to just as needed. For low magnesium start magnesium oxide 400m64md x 4 days then 400mg4mly. Repeat BMET and Mg in 2 weeks   J BraZandra Abts

## 2016-12-17 MED ORDER — MAGNESIUM OXIDE 400 MG PO TABS: 400.0000 mg | ORAL_TABLET | Freq: Every day | ORAL | 3 refills | Status: DC

## 2016-12-17 NOTE — Telephone Encounter (Signed)
Pt made aware. Voiced understanding. Sent in RX for magnesium .

## 2016-12-31 DIAGNOSIS — I890 Lymphedema, not elsewhere classified: Secondary | ICD-10-CM | POA: Diagnosis not present

## 2016-12-31 DIAGNOSIS — R609 Edema, unspecified: Secondary | ICD-10-CM | POA: Diagnosis not present

## 2017-01-02 ENCOUNTER — Telehealth: Payer: Self-pay | Admitting: Family Medicine

## 2017-01-02 NOTE — Telephone Encounter (Signed)
Pt needs refill on oxycodone.

## 2017-01-03 MED ORDER — OXYCODONE-ACETAMINOPHEN 7.5-325 MG PO TABS: ORAL_TABLET | ORAL | 0 refills | Status: DC

## 2017-01-03 NOTE — Telephone Encounter (Signed)
RX printed

## 2017-01-03 NOTE — Telephone Encounter (Signed)
Okay to refill? 

## 2017-01-06 NOTE — Telephone Encounter (Signed)
Patient made aware to come to office to pick up.

## 2017-01-18 ENCOUNTER — Other Ambulatory Visit: Payer: Self-pay | Admitting: Family Medicine

## 2017-01-20 NOTE — Telephone Encounter (Signed)
Ok to refill Diazepam??  Last office visit 11/20/2016.  Last refill 10/09/2016, #2 refills.

## 2017-01-20 NOTE — Telephone Encounter (Signed)
Medication called to pharmacy. 

## 2017-01-20 NOTE — Telephone Encounter (Signed)
okay

## 2017-01-29 ENCOUNTER — Telehealth: Payer: Self-pay | Admitting: *Deleted

## 2017-01-29 MED ORDER — OXYCODONE-ACETAMINOPHEN 7.5-325 MG PO TABS: ORAL_TABLET | ORAL | 0 refills | Status: DC

## 2017-01-29 NOTE — Telephone Encounter (Signed)
Received call from patient.   Requested refill on Oxycodone.   Ok to refill??  Last office visit 2/21/218.  Last refill 01/03/2017.

## 2017-01-29 NOTE — Telephone Encounter (Signed)
okay

## 2017-01-30 DIAGNOSIS — R609 Edema, unspecified: Secondary | ICD-10-CM | POA: Diagnosis not present

## 2017-01-30 DIAGNOSIS — I890 Lymphedema, not elsewhere classified: Secondary | ICD-10-CM | POA: Diagnosis not present

## 2017-01-30 NOTE — Telephone Encounter (Signed)
Prescription printed and patient made aware to come to office to pick up on 01/30/2017.

## 2017-02-10 DIAGNOSIS — E1140 Type 2 diabetes mellitus with diabetic neuropathy, unspecified: Secondary | ICD-10-CM | POA: Diagnosis not present

## 2017-02-10 DIAGNOSIS — M170 Bilateral primary osteoarthritis of knee: Secondary | ICD-10-CM | POA: Diagnosis not present

## 2017-02-10 DIAGNOSIS — Z79891 Long term (current) use of opiate analgesic: Secondary | ICD-10-CM | POA: Diagnosis not present

## 2017-02-10 DIAGNOSIS — J449 Chronic obstructive pulmonary disease, unspecified: Secondary | ICD-10-CM | POA: Diagnosis not present

## 2017-02-10 DIAGNOSIS — I509 Heart failure, unspecified: Secondary | ICD-10-CM | POA: Diagnosis not present

## 2017-02-10 DIAGNOSIS — I110 Hypertensive heart disease with heart failure: Secondary | ICD-10-CM | POA: Diagnosis not present

## 2017-02-10 DIAGNOSIS — I890 Lymphedema, not elsewhere classified: Secondary | ICD-10-CM | POA: Diagnosis not present

## 2017-02-10 DIAGNOSIS — Z794 Long term (current) use of insulin: Secondary | ICD-10-CM | POA: Diagnosis not present

## 2017-02-10 DIAGNOSIS — Z993 Dependence on wheelchair: Secondary | ICD-10-CM | POA: Diagnosis not present

## 2017-02-10 DIAGNOSIS — D509 Iron deficiency anemia, unspecified: Secondary | ICD-10-CM | POA: Diagnosis not present

## 2017-02-10 DIAGNOSIS — E785 Hyperlipidemia, unspecified: Secondary | ICD-10-CM | POA: Diagnosis not present

## 2017-02-12 DIAGNOSIS — Z79891 Long term (current) use of opiate analgesic: Secondary | ICD-10-CM | POA: Diagnosis not present

## 2017-02-12 DIAGNOSIS — E785 Hyperlipidemia, unspecified: Secondary | ICD-10-CM | POA: Diagnosis not present

## 2017-02-12 DIAGNOSIS — J449 Chronic obstructive pulmonary disease, unspecified: Secondary | ICD-10-CM | POA: Diagnosis not present

## 2017-02-12 DIAGNOSIS — I110 Hypertensive heart disease with heart failure: Secondary | ICD-10-CM | POA: Diagnosis not present

## 2017-02-12 DIAGNOSIS — Z993 Dependence on wheelchair: Secondary | ICD-10-CM | POA: Diagnosis not present

## 2017-02-12 DIAGNOSIS — D509 Iron deficiency anemia, unspecified: Secondary | ICD-10-CM | POA: Diagnosis not present

## 2017-02-12 DIAGNOSIS — Z794 Long term (current) use of insulin: Secondary | ICD-10-CM | POA: Diagnosis not present

## 2017-02-12 DIAGNOSIS — I890 Lymphedema, not elsewhere classified: Secondary | ICD-10-CM | POA: Diagnosis not present

## 2017-02-12 DIAGNOSIS — I509 Heart failure, unspecified: Secondary | ICD-10-CM | POA: Diagnosis not present

## 2017-02-12 DIAGNOSIS — E1140 Type 2 diabetes mellitus with diabetic neuropathy, unspecified: Secondary | ICD-10-CM | POA: Diagnosis not present

## 2017-02-12 DIAGNOSIS — M170 Bilateral primary osteoarthritis of knee: Secondary | ICD-10-CM | POA: Diagnosis not present

## 2017-02-17 ENCOUNTER — Ambulatory Visit (INDEPENDENT_AMBULATORY_CARE_PROVIDER_SITE_OTHER): Payer: Medicare Other | Admitting: Family Medicine

## 2017-02-17 ENCOUNTER — Encounter: Payer: Self-pay | Admitting: Family Medicine

## 2017-02-17 VITALS — BP 122/78 | HR 82 | Temp 98.4°F | Resp 18 | Ht 59.0 in | Wt 188.0 lb

## 2017-02-17 DIAGNOSIS — K7581 Nonalcoholic steatohepatitis (NASH): Secondary | ICD-10-CM | POA: Diagnosis not present

## 2017-02-17 DIAGNOSIS — L98421 Non-pressure chronic ulcer of back limited to breakdown of skin: Secondary | ICD-10-CM

## 2017-02-17 DIAGNOSIS — J449 Chronic obstructive pulmonary disease, unspecified: Secondary | ICD-10-CM | POA: Diagnosis not present

## 2017-02-17 DIAGNOSIS — E782 Mixed hyperlipidemia: Secondary | ICD-10-CM | POA: Diagnosis not present

## 2017-02-17 DIAGNOSIS — Z794 Long term (current) use of insulin: Secondary | ICD-10-CM | POA: Diagnosis not present

## 2017-02-17 DIAGNOSIS — E1149 Type 2 diabetes mellitus with other diabetic neurological complication: Secondary | ICD-10-CM | POA: Diagnosis not present

## 2017-02-17 DIAGNOSIS — I890 Lymphedema, not elsewhere classified: Secondary | ICD-10-CM | POA: Diagnosis not present

## 2017-02-17 DIAGNOSIS — M170 Bilateral primary osteoarthritis of knee: Secondary | ICD-10-CM | POA: Diagnosis not present

## 2017-02-17 DIAGNOSIS — M25561 Pain in right knee: Secondary | ICD-10-CM | POA: Diagnosis not present

## 2017-02-17 DIAGNOSIS — G622 Polyneuropathy due to other toxic agents: Secondary | ICD-10-CM

## 2017-02-17 DIAGNOSIS — D509 Iron deficiency anemia, unspecified: Secondary | ICD-10-CM | POA: Diagnosis not present

## 2017-02-17 DIAGNOSIS — E785 Hyperlipidemia, unspecified: Secondary | ICD-10-CM | POA: Diagnosis not present

## 2017-02-17 DIAGNOSIS — I10 Essential (primary) hypertension: Secondary | ICD-10-CM

## 2017-02-17 DIAGNOSIS — Z993 Dependence on wheelchair: Secondary | ICD-10-CM | POA: Diagnosis not present

## 2017-02-17 DIAGNOSIS — M5137 Other intervertebral disc degeneration, lumbosacral region: Secondary | ICD-10-CM

## 2017-02-17 DIAGNOSIS — E1140 Type 2 diabetes mellitus with diabetic neuropathy, unspecified: Secondary | ICD-10-CM | POA: Diagnosis not present

## 2017-02-17 DIAGNOSIS — M48061 Spinal stenosis, lumbar region without neurogenic claudication: Secondary | ICD-10-CM

## 2017-02-17 DIAGNOSIS — G8929 Other chronic pain: Secondary | ICD-10-CM

## 2017-02-17 DIAGNOSIS — I509 Heart failure, unspecified: Secondary | ICD-10-CM | POA: Diagnosis not present

## 2017-02-17 DIAGNOSIS — I110 Hypertensive heart disease with heart failure: Secondary | ICD-10-CM | POA: Diagnosis not present

## 2017-02-17 DIAGNOSIS — Z79891 Long term (current) use of opiate analgesic: Secondary | ICD-10-CM | POA: Diagnosis not present

## 2017-02-17 DIAGNOSIS — M25562 Pain in left knee: Secondary | ICD-10-CM | POA: Diagnosis not present

## 2017-02-17 MED ORDER — OXYCODONE-ACETAMINOPHEN 7.5-325 MG PO TABS: ORAL_TABLET | ORAL | 0 refills | Status: DC

## 2017-02-17 NOTE — Assessment & Plan Note (Signed)
Recent lipids and liver function

## 2017-02-17 NOTE — Assessment & Plan Note (Signed)
Controlled no changes

## 2017-02-17 NOTE — Assessment & Plan Note (Signed)
Chronic back pain, with stenosis, herniated disc, DDD Refilled percocet for June

## 2017-02-17 NOTE — Progress Notes (Signed)
Subjective:    Patient ID: Michael [name], male    DOB: [birth year], 71 y.o.   MRN: [id number]  Patient presents for Follow-up (is not fasting); L Eye Irritation (has had cataract surgery in Oct, but now has redness and itching); and Sacral Irritation (states that he has been using cream, but irritation present- using [place] from [place])  She had a follow-up chronic medical problems. He still followed by the [facility] he is also followed by cardiology locally for his CHF.  Chronic pain he is still taking his pain medication as prescribed for his osteoarthritis as well as his spinal disease.  Diabetes mellitus his last A1c, he is currently on Lantus 25 units which are decreased at the last visit as he was having some hypoglycemia and his A1c was down to 5.1% at the [facility]. He was continued on metformin as well as his statin drug and ACE inhibitor Did not bring meter, had labs done at [place] 1 week ago, states results are being mailed to him   Left eye has redness and irritation since cataract surgery in Feb- Dr. Geoffry Paradise   History of sacral decub- has a sore spot on bottom, using nystatin cream, itching a lot on bottom   [facility]     CBG range-  Review Of Systems:  GEN- denies fatigue, fever, weight loss,weakness, recent illness HEENT- denies eye drainage, change in vision, nasal discharge, CVS- denies chest pain, palpitations RESP- denies SOB, cough, wheeze ABD- denies N/V, change in stools, abd pain GU- denies dysuria, hematuria, dribbling, incontinence MSK- + joint pain, muscle aches, injury Neuro- denies headache, dizziness, syncope, seizure activity       Objective:    BP 122/78   Pulse 82   Temp 98.4 F (36.9 C) (Oral)   Resp 18   Ht 4' 11"  (1.499 m)   Wt 188 lb (85.3 kg) Comment: [facility]2017  SpO2 98%   BMI 37.97 kg/m  GEN- NAD, alert and oriented x3,sitting inwheelchair  HEENT- PERRL, EOM mild injected  left sclera, pink conjunctiva, MMM, oropharynx clear Neck- Supple, no thyromegaly CVS- RRR, no murmur RESP-CTAB ABD-NABS,soft,NT,ND Skin- erythema with stage 1 ulcer left buttoks and irritation/stage 2 toward testicular region ( skin peeling with erythema) EXT- pedal edema chronic lymphedema/stasis changes  Pulses- Radial 2+, DP- diminished         Assessment & Plan:      Problem List Items Addressed This Visit    Type II diabetes mellitus with neurological manifestations ([place]) - Primary    HE will bring me his recent results Goal is , 7% On statin, ACEI      Relevant Orders   HM DIABETES FOOT EXAM (Completed)   Spinal stenosis of lumbar region    Chronic back pain, with stenosis, herniated disc, DDD Refilled percocet for June      Peripheral neuropathy   NASH (nonalcoholic steatohepatitis)    Recent lipids and liver function      Knee pain, chronic   Relevant Medications   oxyCODONE-acetaminophen (PERCOCET) 7.5-325 MG tablet   Hyperlipidemia   Essential hypertension    Controlled no changes       DDD (degenerative disc disease), lumbosacral   Relevant Medications   oxyCODONE-acetaminophen (PERCOCET) 7.5-325 MG tablet    Other Visit Diagnoses    Sacral ulcer, limited to breakdown of skin ([place])       recurrent skin breakdown as wheelchair bound with his comorbodites, get  HHnurse to restart treatment, d/c nystatin, use barrier cream       Note: This dictation was prepared with Dragon dictation along with smaller phrase technology. Any transcriptional errors that result from this process are unintentional.

## 2017-02-17 NOTE — Patient Instructions (Signed)
F/U 4 months for PHYSICAL We will call with lab results

## 2017-02-17 NOTE — Assessment & Plan Note (Signed)
HE will bring me his recent results Goal is , 7% On statin, ACEI

## 2017-02-19 DIAGNOSIS — J449 Chronic obstructive pulmonary disease, unspecified: Secondary | ICD-10-CM | POA: Diagnosis not present

## 2017-02-19 DIAGNOSIS — Z993 Dependence on wheelchair: Secondary | ICD-10-CM | POA: Diagnosis not present

## 2017-02-19 DIAGNOSIS — Z794 Long term (current) use of insulin: Secondary | ICD-10-CM | POA: Diagnosis not present

## 2017-02-19 DIAGNOSIS — M170 Bilateral primary osteoarthritis of knee: Secondary | ICD-10-CM | POA: Diagnosis not present

## 2017-02-19 DIAGNOSIS — E785 Hyperlipidemia, unspecified: Secondary | ICD-10-CM | POA: Diagnosis not present

## 2017-02-19 DIAGNOSIS — E1140 Type 2 diabetes mellitus with diabetic neuropathy, unspecified: Secondary | ICD-10-CM | POA: Diagnosis not present

## 2017-02-19 DIAGNOSIS — I890 Lymphedema, not elsewhere classified: Secondary | ICD-10-CM | POA: Diagnosis not present

## 2017-02-19 DIAGNOSIS — Z79891 Long term (current) use of opiate analgesic: Secondary | ICD-10-CM | POA: Diagnosis not present

## 2017-02-19 DIAGNOSIS — D509 Iron deficiency anemia, unspecified: Secondary | ICD-10-CM | POA: Diagnosis not present

## 2017-02-19 DIAGNOSIS — I110 Hypertensive heart disease with heart failure: Secondary | ICD-10-CM | POA: Diagnosis not present

## 2017-02-19 DIAGNOSIS — I509 Heart failure, unspecified: Secondary | ICD-10-CM | POA: Diagnosis not present

## 2017-02-20 ENCOUNTER — Telehealth: Payer: Self-pay | Admitting: *Deleted

## 2017-02-20 NOTE — Telephone Encounter (Signed)
Call placed to [facility]. Was advised that [facility] PT is not active with this patient.   Patient is active with [facility], [phone] telephone, since 02/10/2017 for PT. VO given to [facility], case manager, for [facility] to eval and treat as indicated for wound care to sacral wound.   Call placed to patient. [place].

## 2017-02-20 NOTE — Telephone Encounter (Signed)
Needs nurse from [place] for wound/Skin care  Received: 3 days ago  Message Contents  Rowley, Modena Nunnery, MD  Myli Pae, Eden Lathe, LPN

## 2017-02-21 DIAGNOSIS — I890 Lymphedema, not elsewhere classified: Secondary | ICD-10-CM | POA: Diagnosis not present

## 2017-02-21 DIAGNOSIS — E785 Hyperlipidemia, unspecified: Secondary | ICD-10-CM | POA: Diagnosis not present

## 2017-02-21 DIAGNOSIS — J449 Chronic obstructive pulmonary disease, unspecified: Secondary | ICD-10-CM | POA: Diagnosis not present

## 2017-02-21 DIAGNOSIS — Z993 Dependence on wheelchair: Secondary | ICD-10-CM | POA: Diagnosis not present

## 2017-02-21 DIAGNOSIS — M170 Bilateral primary osteoarthritis of knee: Secondary | ICD-10-CM | POA: Diagnosis not present

## 2017-02-21 DIAGNOSIS — Z794 Long term (current) use of insulin: Secondary | ICD-10-CM | POA: Diagnosis not present

## 2017-02-21 DIAGNOSIS — D509 Iron deficiency anemia, unspecified: Secondary | ICD-10-CM | POA: Diagnosis not present

## 2017-02-21 DIAGNOSIS — Z79891 Long term (current) use of opiate analgesic: Secondary | ICD-10-CM | POA: Diagnosis not present

## 2017-02-21 DIAGNOSIS — I509 Heart failure, unspecified: Secondary | ICD-10-CM | POA: Diagnosis not present

## 2017-02-21 DIAGNOSIS — I110 Hypertensive heart disease with heart failure: Secondary | ICD-10-CM | POA: Diagnosis not present

## 2017-02-21 DIAGNOSIS — E1140 Type 2 diabetes mellitus with diabetic neuropathy, unspecified: Secondary | ICD-10-CM | POA: Diagnosis not present

## 2017-02-21 NOTE — Telephone Encounter (Signed)
noted 

## 2017-02-23 ENCOUNTER — Other Ambulatory Visit: Payer: Self-pay | Admitting: Family Medicine

## 2017-02-25 ENCOUNTER — Telehealth: Payer: Self-pay | Admitting: *Deleted

## 2017-02-25 DIAGNOSIS — E1140 Type 2 diabetes mellitus with diabetic neuropathy, unspecified: Secondary | ICD-10-CM | POA: Diagnosis not present

## 2017-02-25 DIAGNOSIS — I509 Heart failure, unspecified: Secondary | ICD-10-CM | POA: Diagnosis not present

## 2017-02-25 DIAGNOSIS — Z794 Long term (current) use of insulin: Secondary | ICD-10-CM | POA: Diagnosis not present

## 2017-02-25 DIAGNOSIS — I890 Lymphedema, not elsewhere classified: Secondary | ICD-10-CM | POA: Diagnosis not present

## 2017-02-25 DIAGNOSIS — J449 Chronic obstructive pulmonary disease, unspecified: Secondary | ICD-10-CM | POA: Diagnosis not present

## 2017-02-25 DIAGNOSIS — I110 Hypertensive heart disease with heart failure: Secondary | ICD-10-CM | POA: Diagnosis not present

## 2017-02-25 DIAGNOSIS — E785 Hyperlipidemia, unspecified: Secondary | ICD-10-CM | POA: Diagnosis not present

## 2017-02-25 DIAGNOSIS — Z79891 Long term (current) use of opiate analgesic: Secondary | ICD-10-CM | POA: Diagnosis not present

## 2017-02-25 DIAGNOSIS — M170 Bilateral primary osteoarthritis of knee: Secondary | ICD-10-CM | POA: Diagnosis not present

## 2017-02-25 DIAGNOSIS — Z993 Dependence on wheelchair: Secondary | ICD-10-CM | POA: Diagnosis not present

## 2017-02-25 DIAGNOSIS — H25813 Combined forms of age-related cataract, bilateral: Secondary | ICD-10-CM | POA: Diagnosis not present

## 2017-02-25 DIAGNOSIS — E119 Type 2 diabetes mellitus without complications: Secondary | ICD-10-CM | POA: Diagnosis not present

## 2017-02-25 DIAGNOSIS — D509 Iron deficiency anemia, unspecified: Secondary | ICD-10-CM | POA: Diagnosis not present

## 2017-02-25 LAB — HM DIABETES EYE EXAM

## 2017-02-25 NOTE — Telephone Encounter (Signed)
Received call from [place], [place] with [facility].   Reports that patient had evaluation with [facility] for wound care. Reports that no open areas observed. Area to leg resembled blister. Bandage placed to protect skin. Barrier cream applied to sacrum and buttocks. Requested VO to continue to monitor and educate about skin care and DM 1x weekly x1 week, 2x weekly x2 weeks, and 1x weekly x5 weeks.   VO given. Md to be made aware.

## 2017-02-25 NOTE — Telephone Encounter (Signed)
noted 

## 2017-02-26 DIAGNOSIS — I110 Hypertensive heart disease with heart failure: Secondary | ICD-10-CM | POA: Diagnosis not present

## 2017-02-26 DIAGNOSIS — I509 Heart failure, unspecified: Secondary | ICD-10-CM | POA: Diagnosis not present

## 2017-02-26 DIAGNOSIS — I890 Lymphedema, not elsewhere classified: Secondary | ICD-10-CM | POA: Diagnosis not present

## 2017-02-26 DIAGNOSIS — E785 Hyperlipidemia, unspecified: Secondary | ICD-10-CM | POA: Diagnosis not present

## 2017-02-26 DIAGNOSIS — Z79891 Long term (current) use of opiate analgesic: Secondary | ICD-10-CM | POA: Diagnosis not present

## 2017-02-26 DIAGNOSIS — Z993 Dependence on wheelchair: Secondary | ICD-10-CM | POA: Diagnosis not present

## 2017-02-26 DIAGNOSIS — D509 Iron deficiency anemia, unspecified: Secondary | ICD-10-CM | POA: Diagnosis not present

## 2017-02-26 DIAGNOSIS — E1140 Type 2 diabetes mellitus with diabetic neuropathy, unspecified: Secondary | ICD-10-CM | POA: Diagnosis not present

## 2017-02-26 DIAGNOSIS — J449 Chronic obstructive pulmonary disease, unspecified: Secondary | ICD-10-CM | POA: Diagnosis not present

## 2017-02-26 DIAGNOSIS — Z794 Long term (current) use of insulin: Secondary | ICD-10-CM | POA: Diagnosis not present

## 2017-02-26 DIAGNOSIS — M170 Bilateral primary osteoarthritis of knee: Secondary | ICD-10-CM | POA: Diagnosis not present

## 2017-02-27 ENCOUNTER — Other Ambulatory Visit: Payer: Self-pay | Admitting: *Deleted

## 2017-02-27 DIAGNOSIS — E1140 Type 2 diabetes mellitus with diabetic neuropathy, unspecified: Secondary | ICD-10-CM | POA: Diagnosis not present

## 2017-02-27 DIAGNOSIS — E1151 Type 2 diabetes mellitus with diabetic peripheral angiopathy without gangrene: Secondary | ICD-10-CM | POA: Diagnosis not present

## 2017-02-27 NOTE — Patient Outreach (Signed)
[facility]) Care Management  02/27/2017  EASTEN MACEACHERN 1946-06-28 [id number]  Call received from Mr. Cuttino who is not currently active with [facility] but was active for several months for management of CHF, Lymphedema, and Diabetes Mellitus Type II, and persistent intermittent pressure ulcers of the sacrum and buttocks.   Mr. Hietala reports that per report of his home health nurse he has new blisters on the lower extremities that the nurse believes are consistent with lymphedema and that he also has some "mild skin breakdown" in the gluteal folds and around the scrotum". Mr. Propes recalls that he used Nystatin powder and/or cream last year when this problem occurred during warm weather. He is scheduled for another home health nurse visit today but said he wanted me to know what was going on in case he started having difficulty as he did last year.   I advised that he discuss his questions with his home health nurse and provide my number to her should she feel she would like to consult with me. I am happy to provide any assistance needed for Mr. Duross care, including re-opening his case for case management services.   Plan: I will reach out to Mr. Pultz by phone next week to follow up on his progress.    [facility]  [phone]

## 2017-02-28 ENCOUNTER — Other Ambulatory Visit: Payer: Self-pay | Admitting: *Deleted

## 2017-02-28 DIAGNOSIS — J449 Chronic obstructive pulmonary disease, unspecified: Secondary | ICD-10-CM | POA: Diagnosis not present

## 2017-02-28 DIAGNOSIS — Z794 Long term (current) use of insulin: Secondary | ICD-10-CM | POA: Diagnosis not present

## 2017-02-28 DIAGNOSIS — I890 Lymphedema, not elsewhere classified: Secondary | ICD-10-CM | POA: Diagnosis not present

## 2017-02-28 DIAGNOSIS — Z79891 Long term (current) use of opiate analgesic: Secondary | ICD-10-CM | POA: Diagnosis not present

## 2017-02-28 DIAGNOSIS — Z993 Dependence on wheelchair: Secondary | ICD-10-CM | POA: Diagnosis not present

## 2017-02-28 DIAGNOSIS — E785 Hyperlipidemia, unspecified: Secondary | ICD-10-CM | POA: Diagnosis not present

## 2017-02-28 DIAGNOSIS — D509 Iron deficiency anemia, unspecified: Secondary | ICD-10-CM | POA: Diagnosis not present

## 2017-02-28 DIAGNOSIS — I509 Heart failure, unspecified: Secondary | ICD-10-CM | POA: Diagnosis not present

## 2017-02-28 DIAGNOSIS — E1140 Type 2 diabetes mellitus with diabetic neuropathy, unspecified: Secondary | ICD-10-CM | POA: Diagnosis not present

## 2017-02-28 DIAGNOSIS — I110 Hypertensive heart disease with heart failure: Secondary | ICD-10-CM | POA: Diagnosis not present

## 2017-02-28 DIAGNOSIS — M170 Bilateral primary osteoarthritis of knee: Secondary | ICD-10-CM | POA: Diagnosis not present

## 2017-02-28 MED ORDER — NYSTATIN 100000 UNIT/GM EX POWD: Freq: Four times a day (QID) | CUTANEOUS | 0 refills | Status: DC

## 2017-03-02 DIAGNOSIS — R609 Edema, unspecified: Secondary | ICD-10-CM | POA: Diagnosis not present

## 2017-03-02 DIAGNOSIS — I890 Lymphedema, not elsewhere classified: Secondary | ICD-10-CM | POA: Diagnosis not present

## 2017-03-03 ENCOUNTER — Telehealth: Payer: Self-pay | Admitting: Adult Health

## 2017-03-03 DIAGNOSIS — M170 Bilateral primary osteoarthritis of knee: Secondary | ICD-10-CM | POA: Diagnosis not present

## 2017-03-03 DIAGNOSIS — I110 Hypertensive heart disease with heart failure: Secondary | ICD-10-CM | POA: Diagnosis not present

## 2017-03-03 DIAGNOSIS — Z993 Dependence on wheelchair: Secondary | ICD-10-CM | POA: Diagnosis not present

## 2017-03-03 DIAGNOSIS — Z79891 Long term (current) use of opiate analgesic: Secondary | ICD-10-CM | POA: Diagnosis not present

## 2017-03-03 DIAGNOSIS — D509 Iron deficiency anemia, unspecified: Secondary | ICD-10-CM | POA: Diagnosis not present

## 2017-03-03 DIAGNOSIS — Z794 Long term (current) use of insulin: Secondary | ICD-10-CM | POA: Diagnosis not present

## 2017-03-03 DIAGNOSIS — I509 Heart failure, unspecified: Secondary | ICD-10-CM | POA: Diagnosis not present

## 2017-03-03 DIAGNOSIS — I890 Lymphedema, not elsewhere classified: Secondary | ICD-10-CM | POA: Diagnosis not present

## 2017-03-03 DIAGNOSIS — E785 Hyperlipidemia, unspecified: Secondary | ICD-10-CM | POA: Diagnosis not present

## 2017-03-03 DIAGNOSIS — E1140 Type 2 diabetes mellitus with diabetic neuropathy, unspecified: Secondary | ICD-10-CM | POA: Diagnosis not present

## 2017-03-03 DIAGNOSIS — J449 Chronic obstructive pulmonary disease, unspecified: Secondary | ICD-10-CM | POA: Diagnosis not present

## 2017-03-03 NOTE — Telephone Encounter (Signed)
Per PT assistant, patient is having PVC's and rate of 58. Please call patient.t / tg

## 2017-03-04 DIAGNOSIS — J449 Chronic obstructive pulmonary disease, unspecified: Secondary | ICD-10-CM | POA: Diagnosis not present

## 2017-03-04 DIAGNOSIS — I110 Hypertensive heart disease with heart failure: Secondary | ICD-10-CM | POA: Diagnosis not present

## 2017-03-04 DIAGNOSIS — I890 Lymphedema, not elsewhere classified: Secondary | ICD-10-CM | POA: Diagnosis not present

## 2017-03-04 DIAGNOSIS — E785 Hyperlipidemia, unspecified: Secondary | ICD-10-CM | POA: Diagnosis not present

## 2017-03-04 DIAGNOSIS — D509 Iron deficiency anemia, unspecified: Secondary | ICD-10-CM | POA: Diagnosis not present

## 2017-03-04 DIAGNOSIS — Z79891 Long term (current) use of opiate analgesic: Secondary | ICD-10-CM | POA: Diagnosis not present

## 2017-03-04 DIAGNOSIS — I509 Heart failure, unspecified: Secondary | ICD-10-CM | POA: Diagnosis not present

## 2017-03-04 DIAGNOSIS — M170 Bilateral primary osteoarthritis of knee: Secondary | ICD-10-CM | POA: Diagnosis not present

## 2017-03-04 DIAGNOSIS — Z993 Dependence on wheelchair: Secondary | ICD-10-CM | POA: Diagnosis not present

## 2017-03-04 DIAGNOSIS — E1140 Type 2 diabetes mellitus with diabetic neuropathy, unspecified: Secondary | ICD-10-CM | POA: Diagnosis not present

## 2017-03-04 DIAGNOSIS — Z794 Long term (current) use of insulin: Secondary | ICD-10-CM | POA: Diagnosis not present

## 2017-03-04 NOTE — Telephone Encounter (Signed)
Patient told PT that he has known PVC's and is not worried about it at all

## 2017-03-06 ENCOUNTER — Encounter: Payer: Self-pay | Admitting: Family Medicine

## 2017-03-06 ENCOUNTER — Telehealth: Payer: Self-pay | Admitting: *Deleted

## 2017-03-06 DIAGNOSIS — E785 Hyperlipidemia, unspecified: Secondary | ICD-10-CM | POA: Diagnosis not present

## 2017-03-06 DIAGNOSIS — J449 Chronic obstructive pulmonary disease, unspecified: Secondary | ICD-10-CM | POA: Diagnosis not present

## 2017-03-06 DIAGNOSIS — Z79891 Long term (current) use of opiate analgesic: Secondary | ICD-10-CM | POA: Diagnosis not present

## 2017-03-06 DIAGNOSIS — D509 Iron deficiency anemia, unspecified: Secondary | ICD-10-CM | POA: Diagnosis not present

## 2017-03-06 DIAGNOSIS — I110 Hypertensive heart disease with heart failure: Secondary | ICD-10-CM | POA: Diagnosis not present

## 2017-03-06 DIAGNOSIS — M170 Bilateral primary osteoarthritis of knee: Secondary | ICD-10-CM | POA: Diagnosis not present

## 2017-03-06 DIAGNOSIS — I890 Lymphedema, not elsewhere classified: Secondary | ICD-10-CM | POA: Diagnosis not present

## 2017-03-06 DIAGNOSIS — Z993 Dependence on wheelchair: Secondary | ICD-10-CM | POA: Diagnosis not present

## 2017-03-06 DIAGNOSIS — Z794 Long term (current) use of insulin: Secondary | ICD-10-CM | POA: Diagnosis not present

## 2017-03-06 DIAGNOSIS — E1140 Type 2 diabetes mellitus with diabetic neuropathy, unspecified: Secondary | ICD-10-CM | POA: Diagnosis not present

## 2017-03-06 DIAGNOSIS — I509 Heart failure, unspecified: Secondary | ICD-10-CM | POA: Diagnosis not present

## 2017-03-06 MED ORDER — DICLOFENAC SODIUM 1 % TD GEL: 4.0000 g | Freq: Four times a day (QID) | TRANSDERMAL | 3 refills | Status: DC | PRN

## 2017-03-06 NOTE — Telephone Encounter (Signed)
Received PA determination.    [id number] approved through 09/29/2017.  Pharmacy made aware.

## 2017-03-06 NOTE — Telephone Encounter (Signed)
Received request from pharmacy for PA on Voltaren gel.   PA submitted.   Dx: OA, Knees.

## 2017-03-11 DIAGNOSIS — M170 Bilateral primary osteoarthritis of knee: Secondary | ICD-10-CM | POA: Diagnosis not present

## 2017-03-11 DIAGNOSIS — E785 Hyperlipidemia, unspecified: Secondary | ICD-10-CM | POA: Diagnosis not present

## 2017-03-11 DIAGNOSIS — J449 Chronic obstructive pulmonary disease, unspecified: Secondary | ICD-10-CM | POA: Diagnosis not present

## 2017-03-11 DIAGNOSIS — Z794 Long term (current) use of insulin: Secondary | ICD-10-CM | POA: Diagnosis not present

## 2017-03-11 DIAGNOSIS — D509 Iron deficiency anemia, unspecified: Secondary | ICD-10-CM | POA: Diagnosis not present

## 2017-03-11 DIAGNOSIS — I890 Lymphedema, not elsewhere classified: Secondary | ICD-10-CM | POA: Diagnosis not present

## 2017-03-11 DIAGNOSIS — Z993 Dependence on wheelchair: Secondary | ICD-10-CM | POA: Diagnosis not present

## 2017-03-11 DIAGNOSIS — I110 Hypertensive heart disease with heart failure: Secondary | ICD-10-CM | POA: Diagnosis not present

## 2017-03-11 DIAGNOSIS — I509 Heart failure, unspecified: Secondary | ICD-10-CM | POA: Diagnosis not present

## 2017-03-11 DIAGNOSIS — Z79891 Long term (current) use of opiate analgesic: Secondary | ICD-10-CM | POA: Diagnosis not present

## 2017-03-11 DIAGNOSIS — E1140 Type 2 diabetes mellitus with diabetic neuropathy, unspecified: Secondary | ICD-10-CM | POA: Diagnosis not present

## 2017-03-13 DIAGNOSIS — I890 Lymphedema, not elsewhere classified: Secondary | ICD-10-CM | POA: Diagnosis not present

## 2017-03-13 DIAGNOSIS — D509 Iron deficiency anemia, unspecified: Secondary | ICD-10-CM | POA: Diagnosis not present

## 2017-03-13 DIAGNOSIS — Z993 Dependence on wheelchair: Secondary | ICD-10-CM | POA: Diagnosis not present

## 2017-03-13 DIAGNOSIS — J449 Chronic obstructive pulmonary disease, unspecified: Secondary | ICD-10-CM | POA: Diagnosis not present

## 2017-03-13 DIAGNOSIS — I110 Hypertensive heart disease with heart failure: Secondary | ICD-10-CM | POA: Diagnosis not present

## 2017-03-13 DIAGNOSIS — M170 Bilateral primary osteoarthritis of knee: Secondary | ICD-10-CM | POA: Diagnosis not present

## 2017-03-13 DIAGNOSIS — I509 Heart failure, unspecified: Secondary | ICD-10-CM | POA: Diagnosis not present

## 2017-03-13 DIAGNOSIS — Z79891 Long term (current) use of opiate analgesic: Secondary | ICD-10-CM | POA: Diagnosis not present

## 2017-03-13 DIAGNOSIS — E1140 Type 2 diabetes mellitus with diabetic neuropathy, unspecified: Secondary | ICD-10-CM | POA: Diagnosis not present

## 2017-03-13 DIAGNOSIS — Z794 Long term (current) use of insulin: Secondary | ICD-10-CM | POA: Diagnosis not present

## 2017-03-13 DIAGNOSIS — E785 Hyperlipidemia, unspecified: Secondary | ICD-10-CM | POA: Diagnosis not present

## 2017-03-15 ENCOUNTER — Other Ambulatory Visit: Payer: Self-pay | Admitting: Family Medicine

## 2017-03-17 DIAGNOSIS — Z993 Dependence on wheelchair: Secondary | ICD-10-CM | POA: Diagnosis not present

## 2017-03-17 DIAGNOSIS — J449 Chronic obstructive pulmonary disease, unspecified: Secondary | ICD-10-CM | POA: Diagnosis not present

## 2017-03-17 DIAGNOSIS — Z794 Long term (current) use of insulin: Secondary | ICD-10-CM | POA: Diagnosis not present

## 2017-03-17 DIAGNOSIS — I110 Hypertensive heart disease with heart failure: Secondary | ICD-10-CM | POA: Diagnosis not present

## 2017-03-17 DIAGNOSIS — D509 Iron deficiency anemia, unspecified: Secondary | ICD-10-CM | POA: Diagnosis not present

## 2017-03-17 DIAGNOSIS — M170 Bilateral primary osteoarthritis of knee: Secondary | ICD-10-CM | POA: Diagnosis not present

## 2017-03-17 DIAGNOSIS — Z79891 Long term (current) use of opiate analgesic: Secondary | ICD-10-CM | POA: Diagnosis not present

## 2017-03-17 DIAGNOSIS — I509 Heart failure, unspecified: Secondary | ICD-10-CM | POA: Diagnosis not present

## 2017-03-17 DIAGNOSIS — E1140 Type 2 diabetes mellitus with diabetic neuropathy, unspecified: Secondary | ICD-10-CM | POA: Diagnosis not present

## 2017-03-17 DIAGNOSIS — E785 Hyperlipidemia, unspecified: Secondary | ICD-10-CM | POA: Diagnosis not present

## 2017-03-17 DIAGNOSIS — I890 Lymphedema, not elsewhere classified: Secondary | ICD-10-CM | POA: Diagnosis not present

## 2017-03-18 DIAGNOSIS — Z79891 Long term (current) use of opiate analgesic: Secondary | ICD-10-CM | POA: Diagnosis not present

## 2017-03-18 DIAGNOSIS — D509 Iron deficiency anemia, unspecified: Secondary | ICD-10-CM | POA: Diagnosis not present

## 2017-03-18 DIAGNOSIS — I110 Hypertensive heart disease with heart failure: Secondary | ICD-10-CM | POA: Diagnosis not present

## 2017-03-18 DIAGNOSIS — E1140 Type 2 diabetes mellitus with diabetic neuropathy, unspecified: Secondary | ICD-10-CM | POA: Diagnosis not present

## 2017-03-18 DIAGNOSIS — M170 Bilateral primary osteoarthritis of knee: Secondary | ICD-10-CM | POA: Diagnosis not present

## 2017-03-18 DIAGNOSIS — E785 Hyperlipidemia, unspecified: Secondary | ICD-10-CM | POA: Diagnosis not present

## 2017-03-18 DIAGNOSIS — J449 Chronic obstructive pulmonary disease, unspecified: Secondary | ICD-10-CM | POA: Diagnosis not present

## 2017-03-18 DIAGNOSIS — Z794 Long term (current) use of insulin: Secondary | ICD-10-CM | POA: Diagnosis not present

## 2017-03-18 DIAGNOSIS — Z993 Dependence on wheelchair: Secondary | ICD-10-CM | POA: Diagnosis not present

## 2017-03-18 DIAGNOSIS — I890 Lymphedema, not elsewhere classified: Secondary | ICD-10-CM | POA: Diagnosis not present

## 2017-03-18 DIAGNOSIS — I509 Heart failure, unspecified: Secondary | ICD-10-CM | POA: Diagnosis not present

## 2017-03-20 ENCOUNTER — Telehealth: Payer: Self-pay | Admitting: *Deleted

## 2017-03-20 DIAGNOSIS — E119 Type 2 diabetes mellitus without complications: Secondary | ICD-10-CM

## 2017-03-20 NOTE — Telephone Encounter (Signed)
Patient returned call.   States that he had A1C drawn at [facility] and was told it is 5.8%.   [facility].

## 2017-03-20 NOTE — Telephone Encounter (Signed)
Received lab results from [facility].   MD reviewed and states that A1C was not performed. Patient can have in [place]. Order placed.   Call placed to patient. [place].

## 2017-03-21 DIAGNOSIS — E1140 Type 2 diabetes mellitus with diabetic neuropathy, unspecified: Secondary | ICD-10-CM | POA: Diagnosis not present

## 2017-03-21 DIAGNOSIS — E785 Hyperlipidemia, unspecified: Secondary | ICD-10-CM | POA: Diagnosis not present

## 2017-03-21 DIAGNOSIS — Z993 Dependence on wheelchair: Secondary | ICD-10-CM | POA: Diagnosis not present

## 2017-03-21 DIAGNOSIS — I110 Hypertensive heart disease with heart failure: Secondary | ICD-10-CM | POA: Diagnosis not present

## 2017-03-21 DIAGNOSIS — Z79891 Long term (current) use of opiate analgesic: Secondary | ICD-10-CM | POA: Diagnosis not present

## 2017-03-21 DIAGNOSIS — I509 Heart failure, unspecified: Secondary | ICD-10-CM | POA: Diagnosis not present

## 2017-03-21 DIAGNOSIS — Z794 Long term (current) use of insulin: Secondary | ICD-10-CM | POA: Diagnosis not present

## 2017-03-21 DIAGNOSIS — D509 Iron deficiency anemia, unspecified: Secondary | ICD-10-CM | POA: Diagnosis not present

## 2017-03-21 DIAGNOSIS — J449 Chronic obstructive pulmonary disease, unspecified: Secondary | ICD-10-CM | POA: Diagnosis not present

## 2017-03-21 DIAGNOSIS — I890 Lymphedema, not elsewhere classified: Secondary | ICD-10-CM | POA: Diagnosis not present

## 2017-03-21 DIAGNOSIS — M170 Bilateral primary osteoarthritis of knee: Secondary | ICD-10-CM | POA: Diagnosis not present

## 2017-03-22 ENCOUNTER — Other Ambulatory Visit: Payer: Self-pay | Admitting: Family Medicine

## 2017-03-24 ENCOUNTER — Telehealth: Payer: Self-pay | Admitting: *Deleted

## 2017-03-24 NOTE — Telephone Encounter (Signed)
Received call from patient.   Requested refill on Oxycodone.   Ok to refill??  Last office visit/ refill 02/17/2017.

## 2017-03-24 NOTE — Telephone Encounter (Signed)
Okay to refill? 

## 2017-03-25 DIAGNOSIS — Z79891 Long term (current) use of opiate analgesic: Secondary | ICD-10-CM | POA: Diagnosis not present

## 2017-03-25 DIAGNOSIS — I890 Lymphedema, not elsewhere classified: Secondary | ICD-10-CM | POA: Diagnosis not present

## 2017-03-25 DIAGNOSIS — E1140 Type 2 diabetes mellitus with diabetic neuropathy, unspecified: Secondary | ICD-10-CM | POA: Diagnosis not present

## 2017-03-25 DIAGNOSIS — Z993 Dependence on wheelchair: Secondary | ICD-10-CM | POA: Diagnosis not present

## 2017-03-25 DIAGNOSIS — J449 Chronic obstructive pulmonary disease, unspecified: Secondary | ICD-10-CM | POA: Diagnosis not present

## 2017-03-25 DIAGNOSIS — D509 Iron deficiency anemia, unspecified: Secondary | ICD-10-CM | POA: Diagnosis not present

## 2017-03-25 DIAGNOSIS — M170 Bilateral primary osteoarthritis of knee: Secondary | ICD-10-CM | POA: Diagnosis not present

## 2017-03-25 DIAGNOSIS — I509 Heart failure, unspecified: Secondary | ICD-10-CM | POA: Diagnosis not present

## 2017-03-25 DIAGNOSIS — E785 Hyperlipidemia, unspecified: Secondary | ICD-10-CM | POA: Diagnosis not present

## 2017-03-25 DIAGNOSIS — I110 Hypertensive heart disease with heart failure: Secondary | ICD-10-CM | POA: Diagnosis not present

## 2017-03-25 DIAGNOSIS — Z794 Long term (current) use of insulin: Secondary | ICD-10-CM | POA: Diagnosis not present

## 2017-03-25 MED ORDER — OXYCODONE-ACETAMINOPHEN 7.5-325 MG PO TABS: ORAL_TABLET | ORAL | 0 refills | Status: DC

## 2017-03-25 NOTE — Telephone Encounter (Signed)
Prescription printed and patient made aware to come to office to pick up on 03/26/2017.

## 2017-03-27 ENCOUNTER — Other Ambulatory Visit: Payer: Self-pay | Admitting: Family Medicine

## 2017-03-27 DIAGNOSIS — E785 Hyperlipidemia, unspecified: Secondary | ICD-10-CM | POA: Diagnosis not present

## 2017-03-27 DIAGNOSIS — E119 Type 2 diabetes mellitus without complications: Secondary | ICD-10-CM | POA: Diagnosis not present

## 2017-03-27 DIAGNOSIS — J449 Chronic obstructive pulmonary disease, unspecified: Secondary | ICD-10-CM | POA: Diagnosis not present

## 2017-03-27 DIAGNOSIS — E1140 Type 2 diabetes mellitus with diabetic neuropathy, unspecified: Secondary | ICD-10-CM | POA: Diagnosis not present

## 2017-03-27 DIAGNOSIS — Z794 Long term (current) use of insulin: Secondary | ICD-10-CM | POA: Diagnosis not present

## 2017-03-27 DIAGNOSIS — D509 Iron deficiency anemia, unspecified: Secondary | ICD-10-CM | POA: Diagnosis not present

## 2017-03-27 DIAGNOSIS — Z993 Dependence on wheelchair: Secondary | ICD-10-CM | POA: Diagnosis not present

## 2017-03-27 DIAGNOSIS — I509 Heart failure, unspecified: Secondary | ICD-10-CM | POA: Diagnosis not present

## 2017-03-27 DIAGNOSIS — M170 Bilateral primary osteoarthritis of knee: Secondary | ICD-10-CM | POA: Diagnosis not present

## 2017-03-27 DIAGNOSIS — Z79891 Long term (current) use of opiate analgesic: Secondary | ICD-10-CM | POA: Diagnosis not present

## 2017-03-27 DIAGNOSIS — I110 Hypertensive heart disease with heart failure: Secondary | ICD-10-CM | POA: Diagnosis not present

## 2017-03-27 DIAGNOSIS — I890 Lymphedema, not elsewhere classified: Secondary | ICD-10-CM | POA: Diagnosis not present

## 2017-03-28 ENCOUNTER — Other Ambulatory Visit: Payer: Self-pay | Admitting: *Deleted

## 2017-03-28 DIAGNOSIS — I890 Lymphedema, not elsewhere classified: Secondary | ICD-10-CM | POA: Diagnosis not present

## 2017-03-28 DIAGNOSIS — J449 Chronic obstructive pulmonary disease, unspecified: Secondary | ICD-10-CM | POA: Diagnosis not present

## 2017-03-28 DIAGNOSIS — Z794 Long term (current) use of insulin: Secondary | ICD-10-CM | POA: Diagnosis not present

## 2017-03-28 DIAGNOSIS — M170 Bilateral primary osteoarthritis of knee: Secondary | ICD-10-CM | POA: Diagnosis not present

## 2017-03-28 DIAGNOSIS — D509 Iron deficiency anemia, unspecified: Secondary | ICD-10-CM | POA: Diagnosis not present

## 2017-03-28 DIAGNOSIS — E1140 Type 2 diabetes mellitus with diabetic neuropathy, unspecified: Secondary | ICD-10-CM | POA: Diagnosis not present

## 2017-03-28 DIAGNOSIS — Z79891 Long term (current) use of opiate analgesic: Secondary | ICD-10-CM | POA: Diagnosis not present

## 2017-03-28 DIAGNOSIS — I110 Hypertensive heart disease with heart failure: Secondary | ICD-10-CM | POA: Diagnosis not present

## 2017-03-28 DIAGNOSIS — E785 Hyperlipidemia, unspecified: Secondary | ICD-10-CM | POA: Diagnosis not present

## 2017-03-28 DIAGNOSIS — I509 Heart failure, unspecified: Secondary | ICD-10-CM | POA: Diagnosis not present

## 2017-03-28 DIAGNOSIS — Z993 Dependence on wheelchair: Secondary | ICD-10-CM | POA: Diagnosis not present

## 2017-03-28 LAB — HEMOGLOBIN A1C
Hgb A1c MFr Bld: 5.6 % (ref <5.7)
Mean Plasma Glucose: 114 mg/dL

## 2017-03-28 MED ORDER — INSULIN GLARGINE 100 UNIT/ML SOLOSTAR PEN: 20.0000 [IU] | PEN_INJECTOR | Freq: Every day | SUBCUTANEOUS | 0 refills | Status: DC

## 2017-03-31 ENCOUNTER — Telehealth: Payer: Self-pay | Admitting: *Deleted

## 2017-03-31 NOTE — Telephone Encounter (Signed)
Prescription printed and sent to [facility].

## 2017-03-31 NOTE — Telephone Encounter (Signed)
Received call from patient.   Requested Gel Overlay Cushion for wheelchair to reduce pressure to buttocks/ sacrum.   Ok to order?

## 2017-03-31 NOTE — Telephone Encounter (Signed)
okay

## 2017-04-01 DIAGNOSIS — I509 Heart failure, unspecified: Secondary | ICD-10-CM | POA: Diagnosis not present

## 2017-04-01 DIAGNOSIS — Z993 Dependence on wheelchair: Secondary | ICD-10-CM | POA: Diagnosis not present

## 2017-04-01 DIAGNOSIS — M170 Bilateral primary osteoarthritis of knee: Secondary | ICD-10-CM | POA: Diagnosis not present

## 2017-04-01 DIAGNOSIS — Z794 Long term (current) use of insulin: Secondary | ICD-10-CM | POA: Diagnosis not present

## 2017-04-01 DIAGNOSIS — J449 Chronic obstructive pulmonary disease, unspecified: Secondary | ICD-10-CM | POA: Diagnosis not present

## 2017-04-01 DIAGNOSIS — E785 Hyperlipidemia, unspecified: Secondary | ICD-10-CM | POA: Diagnosis not present

## 2017-04-01 DIAGNOSIS — I110 Hypertensive heart disease with heart failure: Secondary | ICD-10-CM | POA: Diagnosis not present

## 2017-04-01 DIAGNOSIS — I890 Lymphedema, not elsewhere classified: Secondary | ICD-10-CM | POA: Diagnosis not present

## 2017-04-01 DIAGNOSIS — D509 Iron deficiency anemia, unspecified: Secondary | ICD-10-CM | POA: Diagnosis not present

## 2017-04-01 DIAGNOSIS — E1140 Type 2 diabetes mellitus with diabetic neuropathy, unspecified: Secondary | ICD-10-CM | POA: Diagnosis not present

## 2017-04-01 DIAGNOSIS — Z79891 Long term (current) use of opiate analgesic: Secondary | ICD-10-CM | POA: Diagnosis not present

## 2017-04-01 DIAGNOSIS — R609 Edema, unspecified: Secondary | ICD-10-CM | POA: Diagnosis not present

## 2017-04-03 DIAGNOSIS — E1140 Type 2 diabetes mellitus with diabetic neuropathy, unspecified: Secondary | ICD-10-CM | POA: Diagnosis not present

## 2017-04-03 DIAGNOSIS — I110 Hypertensive heart disease with heart failure: Secondary | ICD-10-CM | POA: Diagnosis not present

## 2017-04-03 DIAGNOSIS — Z794 Long term (current) use of insulin: Secondary | ICD-10-CM | POA: Diagnosis not present

## 2017-04-03 DIAGNOSIS — I890 Lymphedema, not elsewhere classified: Secondary | ICD-10-CM | POA: Diagnosis not present

## 2017-04-03 DIAGNOSIS — D509 Iron deficiency anemia, unspecified: Secondary | ICD-10-CM | POA: Diagnosis not present

## 2017-04-03 DIAGNOSIS — Z79891 Long term (current) use of opiate analgesic: Secondary | ICD-10-CM | POA: Diagnosis not present

## 2017-04-03 DIAGNOSIS — Z993 Dependence on wheelchair: Secondary | ICD-10-CM | POA: Diagnosis not present

## 2017-04-03 DIAGNOSIS — J449 Chronic obstructive pulmonary disease, unspecified: Secondary | ICD-10-CM | POA: Diagnosis not present

## 2017-04-03 DIAGNOSIS — I509 Heart failure, unspecified: Secondary | ICD-10-CM | POA: Diagnosis not present

## 2017-04-03 DIAGNOSIS — E785 Hyperlipidemia, unspecified: Secondary | ICD-10-CM | POA: Diagnosis not present

## 2017-04-03 DIAGNOSIS — M170 Bilateral primary osteoarthritis of knee: Secondary | ICD-10-CM | POA: Diagnosis not present

## 2017-04-08 DIAGNOSIS — E1140 Type 2 diabetes mellitus with diabetic neuropathy, unspecified: Secondary | ICD-10-CM | POA: Diagnosis not present

## 2017-04-08 DIAGNOSIS — I890 Lymphedema, not elsewhere classified: Secondary | ICD-10-CM | POA: Diagnosis not present

## 2017-04-08 DIAGNOSIS — E785 Hyperlipidemia, unspecified: Secondary | ICD-10-CM | POA: Diagnosis not present

## 2017-04-08 DIAGNOSIS — I509 Heart failure, unspecified: Secondary | ICD-10-CM | POA: Diagnosis not present

## 2017-04-08 DIAGNOSIS — I110 Hypertensive heart disease with heart failure: Secondary | ICD-10-CM | POA: Diagnosis not present

## 2017-04-08 DIAGNOSIS — M170 Bilateral primary osteoarthritis of knee: Secondary | ICD-10-CM | POA: Diagnosis not present

## 2017-04-08 DIAGNOSIS — D509 Iron deficiency anemia, unspecified: Secondary | ICD-10-CM | POA: Diagnosis not present

## 2017-04-08 DIAGNOSIS — Z79891 Long term (current) use of opiate analgesic: Secondary | ICD-10-CM | POA: Diagnosis not present

## 2017-04-08 DIAGNOSIS — Z993 Dependence on wheelchair: Secondary | ICD-10-CM | POA: Diagnosis not present

## 2017-04-08 DIAGNOSIS — Z794 Long term (current) use of insulin: Secondary | ICD-10-CM | POA: Diagnosis not present

## 2017-04-08 DIAGNOSIS — J449 Chronic obstructive pulmonary disease, unspecified: Secondary | ICD-10-CM | POA: Diagnosis not present

## 2017-04-09 DIAGNOSIS — M5137 Other intervertebral disc degeneration, lumbosacral region: Secondary | ICD-10-CM | POA: Diagnosis not present

## 2017-04-09 DIAGNOSIS — R809 Proteinuria, unspecified: Secondary | ICD-10-CM | POA: Diagnosis not present

## 2017-04-09 DIAGNOSIS — R609 Edema, unspecified: Secondary | ICD-10-CM | POA: Diagnosis not present

## 2017-04-09 DIAGNOSIS — R269 Unspecified abnormalities of gait and mobility: Secondary | ICD-10-CM | POA: Diagnosis not present

## 2017-04-09 DIAGNOSIS — L89312 Pressure ulcer of right buttock, stage 2: Secondary | ICD-10-CM | POA: Diagnosis not present

## 2017-04-10 DIAGNOSIS — Z794 Long term (current) use of insulin: Secondary | ICD-10-CM | POA: Diagnosis not present

## 2017-04-10 DIAGNOSIS — J449 Chronic obstructive pulmonary disease, unspecified: Secondary | ICD-10-CM | POA: Diagnosis not present

## 2017-04-10 DIAGNOSIS — D509 Iron deficiency anemia, unspecified: Secondary | ICD-10-CM | POA: Diagnosis not present

## 2017-04-10 DIAGNOSIS — Z993 Dependence on wheelchair: Secondary | ICD-10-CM | POA: Diagnosis not present

## 2017-04-10 DIAGNOSIS — I509 Heart failure, unspecified: Secondary | ICD-10-CM | POA: Diagnosis not present

## 2017-04-10 DIAGNOSIS — I890 Lymphedema, not elsewhere classified: Secondary | ICD-10-CM | POA: Diagnosis not present

## 2017-04-10 DIAGNOSIS — M170 Bilateral primary osteoarthritis of knee: Secondary | ICD-10-CM | POA: Diagnosis not present

## 2017-04-10 DIAGNOSIS — E785 Hyperlipidemia, unspecified: Secondary | ICD-10-CM | POA: Diagnosis not present

## 2017-04-10 DIAGNOSIS — I110 Hypertensive heart disease with heart failure: Secondary | ICD-10-CM | POA: Diagnosis not present

## 2017-04-10 DIAGNOSIS — Z79891 Long term (current) use of opiate analgesic: Secondary | ICD-10-CM | POA: Diagnosis not present

## 2017-04-10 DIAGNOSIS — E1140 Type 2 diabetes mellitus with diabetic neuropathy, unspecified: Secondary | ICD-10-CM | POA: Diagnosis not present

## 2017-04-12 ENCOUNTER — Other Ambulatory Visit: Payer: Self-pay | Admitting: Family Medicine

## 2017-04-14 NOTE — Telephone Encounter (Signed)
Refill appropriate and filled per protocol. 

## 2017-04-19 ENCOUNTER — Other Ambulatory Visit: Payer: Self-pay | Admitting: Family Medicine

## 2017-04-21 NOTE — Telephone Encounter (Signed)
Okay to refill? 

## 2017-04-21 NOTE — Telephone Encounter (Signed)
Ok to refill??  Last office visit 02/17/2017.  Last refill 01/20/2017, #2 refills.

## 2017-04-21 NOTE — Telephone Encounter (Signed)
Medication called to pharmacy. 

## 2017-04-22 ENCOUNTER — Telehealth: Payer: Self-pay | Admitting: *Deleted

## 2017-04-22 MED ORDER — OXYCODONE-ACETAMINOPHEN 7.5-325 MG PO TABS: ORAL_TABLET | ORAL | 0 refills | Status: DC

## 2017-04-22 NOTE — Telephone Encounter (Signed)
Prescription printed and patient made aware to come to office to pick up on 04/23/2017.

## 2017-04-22 NOTE — Telephone Encounter (Signed)
okay

## 2017-04-22 NOTE — Telephone Encounter (Signed)
Received call from patient.   Requested refill on Oxycodone.   Ok to refill??  Last office visit 02/17/2017.  Last refill 12/23/2016.

## 2017-04-29 ENCOUNTER — Encounter: Payer: Self-pay | Admitting: Family Medicine

## 2017-04-29 ENCOUNTER — Ambulatory Visit (INDEPENDENT_AMBULATORY_CARE_PROVIDER_SITE_OTHER): Payer: Medicare Other | Admitting: Family Medicine

## 2017-04-29 VITALS — BP 130/78 | HR 100 | Temp 98.4°F | Resp 18

## 2017-04-29 DIAGNOSIS — R609 Edema, unspecified: Secondary | ICD-10-CM

## 2017-04-29 DIAGNOSIS — I5032 Chronic diastolic (congestive) heart failure: Secondary | ICD-10-CM

## 2017-04-29 MED ORDER — OXYCODONE-ACETAMINOPHEN 10-325 MG PO TABS: 1.0000 | ORAL_TABLET | Freq: Three times a day (TID) | ORAL | 0 refills | Status: DC | PRN

## 2017-04-29 NOTE — Patient Instructions (Signed)
F/U Monday for recheck and removal of Unna Boots  We will call with lab results  Increase zaroxlyn to 42m daily for 3 days ( Take 2 of the 2.568mtablets)  Pain medication given for 3 days only , then get your regular prescription

## 2017-04-29 NOTE — Assessment & Plan Note (Signed)
Exacerbation of his edema in the setting of his diastolic heart failure. I'm going to increase his Metalazone to 58m daily for 3 days, then return to 2.587m continue the bumex 56m55mID as previous Unna boots applied to both legs which has helped in the past BMET done today, will repea tMonday  Currently on cardiac or respiratory symptoms   FOr his pain given 9 tablets of Percocet 10-325m38mr next 3 days only, then can resume his regular perscription of 7.5-325mg

## 2017-04-29 NOTE — Progress Notes (Signed)
   Subjective:    Patient ID: Michael [name], male    DOB: 11/09/1945, 71 y.o.   MRN: [id number]  Patient presents for Blisters to Legs (edema and weeping)  Pt here with bilat leg swelling Worsened over the past 2 weeks. He has history of chronic edema congestive heart failure he's had significant swelling exacerbations multiple times. He asked she started taking his Metalazone on daily in addition to the Bumex 3 mg twice a day. He states that he is urinating a lot but his legs are not going down he actually had some weeping over the past couple of days. He denies any shortness of breath no chest pain or recent illness. He is taking all his other medications as prescribed except for his pain medicine states that his legs are so painful he did take it at stress and has run out of his pain medicine early and cannot get it filled until August 4.   CBG this AM 121  14 day avg 109 , 30 day avg 103 Review Of Systems:  GEN- denies fatigue, fever, weight loss,weakness, recent illness HEENT- denies eye drainage, change in vision, nasal discharge, CVS- denies chest pain, palpitations RESP- denies SOB, cough, wheeze ABD- denies N/V, change in stools, abd pain GU- denies dysuria, hematuria, dribbling, incontinence MSK- + joint pain, muscle aches, injury Neuro- denies headache, dizziness, syncope, seizure activity       Objective:    BP 130/78   Pulse 100   Temp 98.4 F (36.9 C) (Oral)   Resp 18   SpO2 98%  GEN- NAD, alert and oriented x3,sitting in wheelchair  HEENT- PERRL, EOMI, non injected sclera, pink conjunctiva, MMM, oropharynx clear Neck- Supple, no JVD CVS- RRR, no murmur RESP-CTAB ABD-NABS,soft,NT,ND EXT- 2+ pitting edema bilat , ballooning of feet  Pulses- Radial  2+        Assessment & Plan:      Problem List Items Addressed This Visit    Peripheral edema   Relevant Orders   Basic metabolic panel   Chronic diastolic CHF (congestive heart failure) (HCC) - Primary   Exacerbation of his edema in the setting of his diastolic heart failure. I'm going to increase his Metalazone to 39m daily for 3 days, then return to 2.560m continue the bumex 74m59mID as previous Unna boots applied to both legs which has helped in the past BMET done today, will repea tMonday  Currently on cardiac or respiratory symptoms   FOr his pain given 9 tablets of Percocet 10-325m49mr next 3 days only, then can resume his regular perscription of 7.5-325mg       Relevant Orders   Basic metabolic panel      Note: This dictation was prepared with Dragon dictation along with smaller phrase technology. Any transcriptional errors that result from this process are unintentional.

## 2017-04-30 LAB — BASIC METABOLIC PANEL
BUN: 11 mg/dL (ref 7–25)
CO2: 24 mmol/L (ref 20–31)
Calcium: 9 mg/dL (ref 8.6–10.3)
Chloride: 97 mmol/L — ABNORMAL LOW (ref 98–110)
Creat: 0.79 mg/dL (ref 0.70–1.25)
GLUCOSE: 116 mg/dL — AB (ref 70–99)
POTASSIUM: 4.6 mmol/L (ref 3.5–5.3)
SODIUM: 134 mmol/L — AB (ref 135–146)

## 2017-05-02 DIAGNOSIS — I890 Lymphedema, not elsewhere classified: Secondary | ICD-10-CM | POA: Diagnosis not present

## 2017-05-02 DIAGNOSIS — R609 Edema, unspecified: Secondary | ICD-10-CM | POA: Diagnosis not present

## 2017-05-05 ENCOUNTER — Ambulatory Visit (INDEPENDENT_AMBULATORY_CARE_PROVIDER_SITE_OTHER): Payer: Medicare Other | Admitting: Family Medicine

## 2017-05-05 ENCOUNTER — Encounter: Payer: Self-pay | Admitting: Family Medicine

## 2017-05-05 VITALS — BP 128/74 | HR 96 | Temp 98.6°F | Resp 16 | Wt 195.0 lb

## 2017-05-05 DIAGNOSIS — R609 Edema, unspecified: Secondary | ICD-10-CM | POA: Diagnosis not present

## 2017-05-05 DIAGNOSIS — I890 Lymphedema, not elsewhere classified: Secondary | ICD-10-CM

## 2017-05-05 DIAGNOSIS — I5032 Chronic diastolic (congestive) heart failure: Secondary | ICD-10-CM

## 2017-05-05 LAB — BASIC METABOLIC PANEL
BUN: 20 mg/dL (ref 7–25)
CALCIUM: 8.9 mg/dL (ref 8.6–10.3)
CO2: 29 mmol/L (ref 20–32)
Chloride: 88 mmol/L — ABNORMAL LOW (ref 98–110)
Creat: 0.95 mg/dL (ref 0.70–1.25)
GLUCOSE: 175 mg/dL — AB (ref 70–99)
Potassium: 4.6 mmol/L (ref 3.5–5.3)
Sodium: 132 mmol/L — ABNORMAL LOW (ref 135–146)

## 2017-05-05 MED ORDER — POTASSIUM CHLORIDE ER 10 MEQ PO TBCR: 10.0000 meq | EXTENDED_RELEASE_TABLET | Freq: Two times a day (BID) | ORAL | 3 refills | Status: DC

## 2017-05-05 NOTE — Progress Notes (Signed)
   Subjective:    Patient ID: Michael [name], male    DOB: 08-02-1946, 71 y.o.   MRN: [id number]  Patient presents for Follow-up (edema) Is here for removal of Unna Boots - placed 1 week ago  Using secondary to increased peripheral edema he has underlying diastolic heart failure. He is taking his Bumex 62m  I also increased his Zaroxolyn to 5 mg daily for 3 days that he was to return to his normal dose. Due for repeat BMET  He hs not been using lymphedema SCD  His legs are Much improved. He states he tells significant difference and he urinated a lot with the diuretic as well. He did get some cramps in his hands and feet a couple of times.  Review Of Systems:  GEN- denies fatigue, fever, weight loss,weakness, recent illness HEENT- denies eye drainage, change in vision, nasal discharge, CVS- denies chest pain, palpitations RESP- denies SOB, cough, wheeze ABD- denies N/V, change in stools, abd pain GU- denies dysuria, hematuria, dribbling, incontinence MSK- + joint pain, muscle aches, injury Neuro- denies headache, dizziness, syncope, seizure activity       Objective:    BP 128/74   Pulse 96   Temp 98.6 F (37 C) (Oral)   Resp 16   Wt 195 lb (88.5 kg)   SpO2 97%   BMI 39.39 kg/m  GEN- NAD, alert and oriented x3,sitting in wheelchair  HEENT- PERRL, EOMI, non injected sclera, pink conjunctiva, MMM, oropharynx clear CVS- RRR, no murmur RESP-CTAB EXT- 1+ pitting edema bilat , ballooning of feet  Pulses- Radial  2+       Assessment & Plan:      Problem List Items Addressed This Visit    Peripheral edema   Relevant Orders   Basic metabolic panel   Chronic diastolic CHF (congestive heart failure) (HCC)   Relevant Orders   Basic metabolic panel   Lymphedema - Primary    Chronic peripheral edema /lymphedema Much improved with the use of the Unna boots. He now seems to be more back at his baseline he still has some ballooning over the foot which is chronic. He is getting  uses lymphedema boots at home. We are change his diuretic so that he will continue his Bumex 3 mg twice a day along with his potassium. We'll change the metalazone to twice a week he will take him Monday and Friday. If he starts to create more feels like he is getting a little dehydrated he will back down to just once a week. Recheck his potassium as he had some cramps.         Note: This dictation was prepared with Dragon dictation along with smaller phrase technology. Any transcriptional errors that result from this process are unintentional.

## 2017-05-05 NOTE — Assessment & Plan Note (Signed)
Chronic peripheral edema /lymphedema Much improved with the use of the Unna boots. He now seems to be more back at his baseline he still has some ballooning over the foot which is chronic. He is getting uses lymphedema boots at home. We are change his diuretic so that he will continue his Bumex 3 mg twice a day along with his potassium. We'll change the metalazone to twice a week he will take him Monday and Friday. If he starts to create more feels like he is getting a little dehydrated he will back down to just once a week.

## 2017-05-05 NOTE — Patient Instructions (Addendum)
Take the [facility] Monday AND Friday Use your Lymphedema boots  We will call with kidney labs  F/U as previous

## 2017-05-23 IMAGING — US US EXTREM LOW VENOUS BILAT
1 series · 13 of 24 positions shown · non-contrast
Comparison: None.

CLINICAL DATA: 70-year-old male

EXAM:
BILATERAL LOWER EXTREMITY VENOUS DUPLEX ULTRASOUND
TECHNIQUE: Gray-scale sonography with graded compression, as well as color
Doppler and duplex ultrasound, were performed to evaluate the deep
and superficial veins of both lower extremities. Spectral Doppler
was utilized to evaluate flow at rest and with distal augmentation
maneuvers. A complete superficial venous insufficiency exam was
performed in the upright standing position.

[Series 1: us extrem low venous bilat · 0.07mm/px · 13 of 144 slices shown]
[im 1/144]
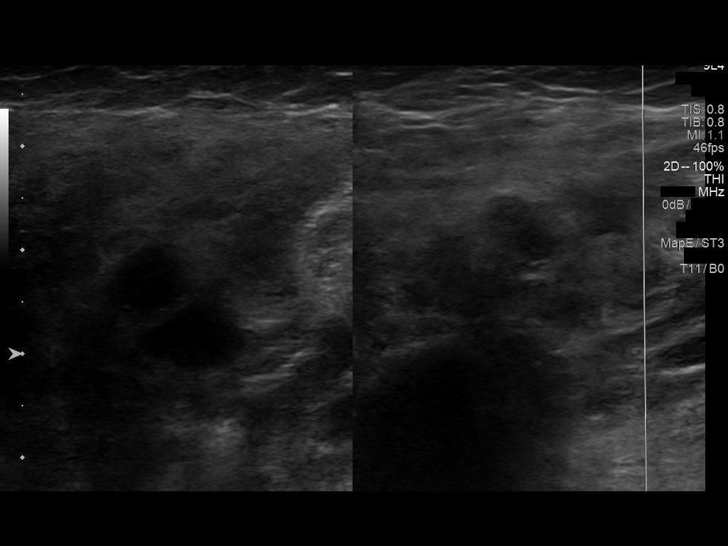
[im 13/144]
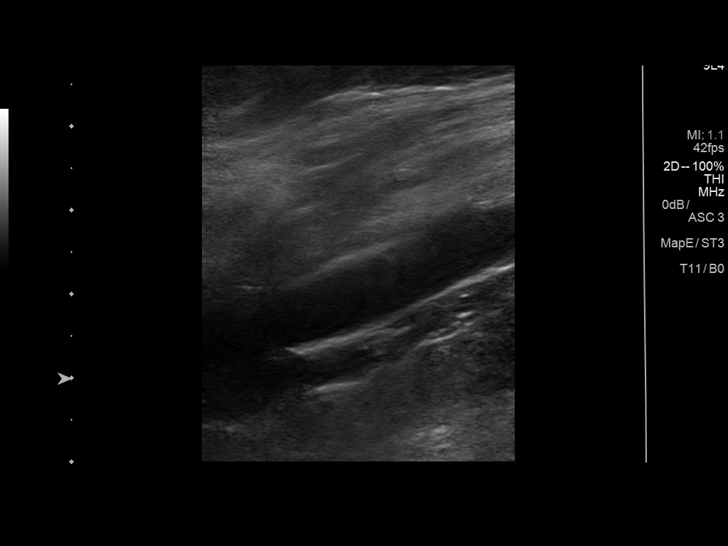
[im 25/144]
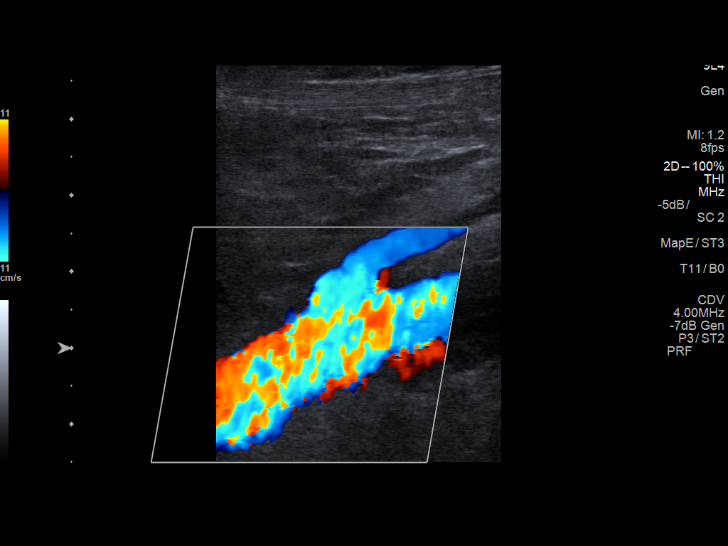
[im 38/144]
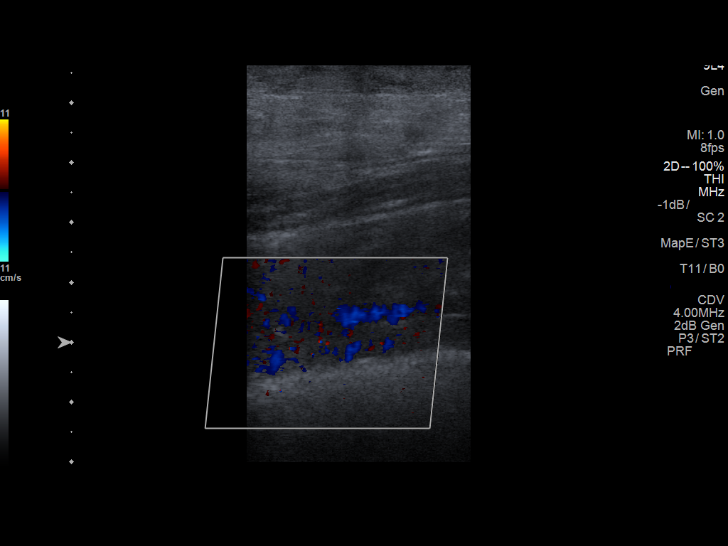
[im 50/144]
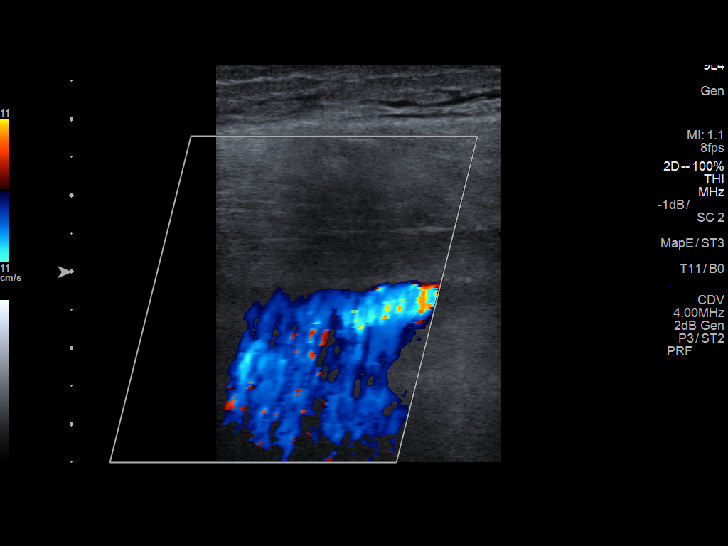
[im 63/144]
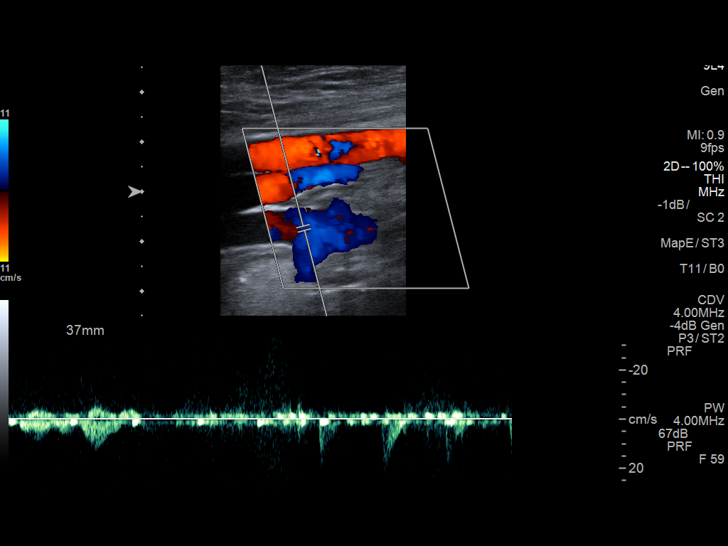
[im 75/144]
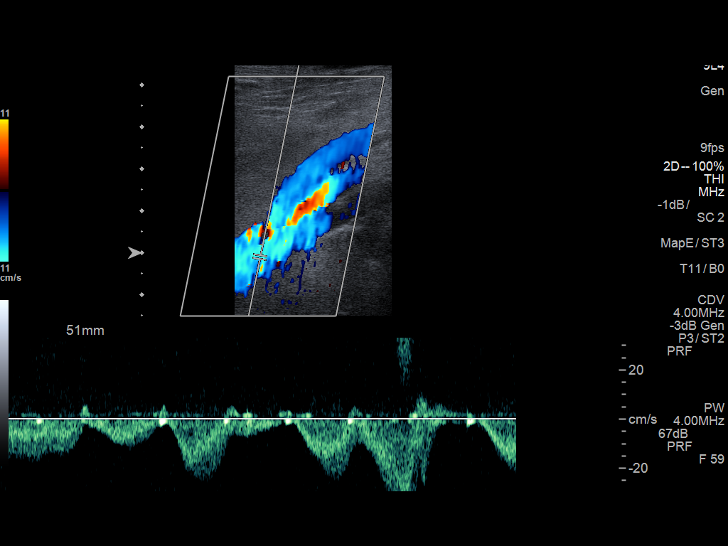
[im 81/144]
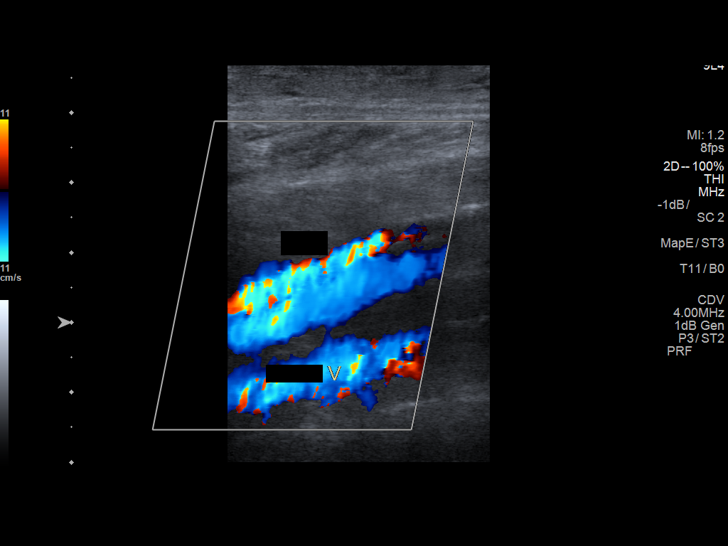
[im 94/144]
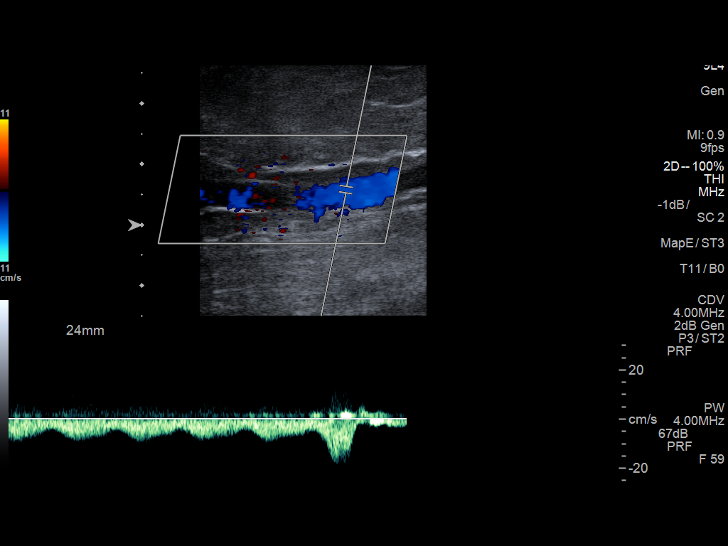
[im 106/144]
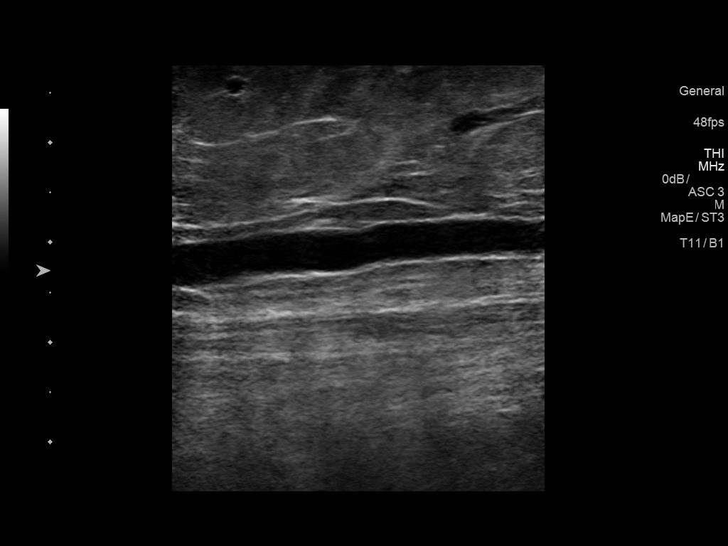
[im 119/144]
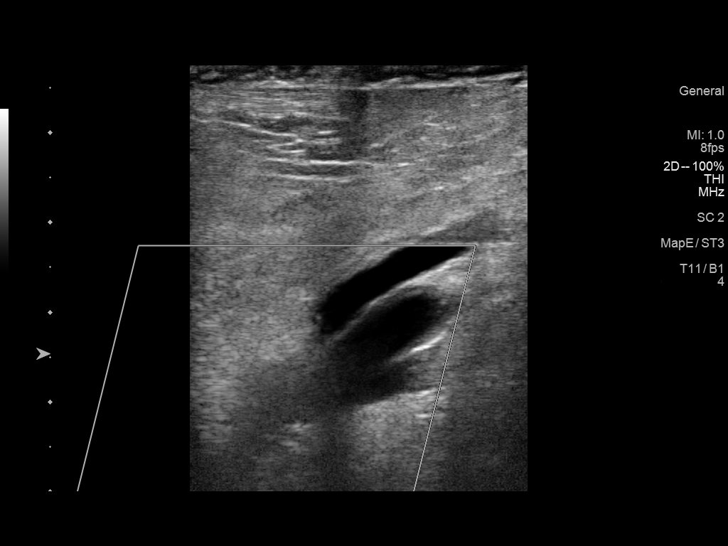
[im 131/144]
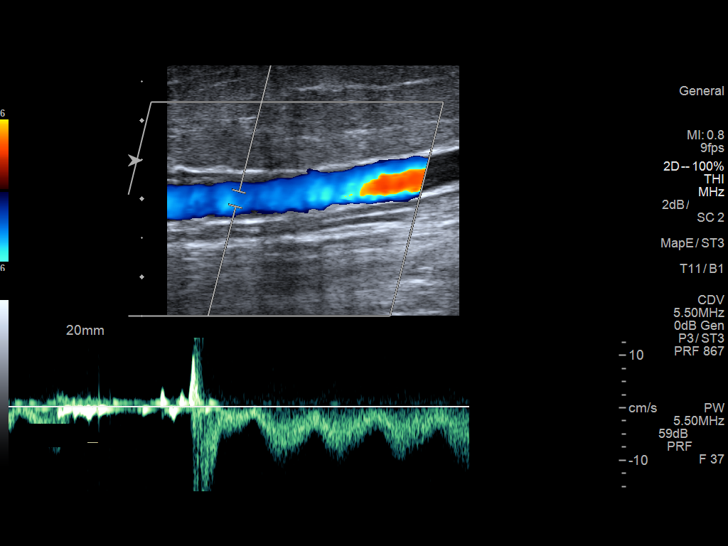
[im 144/144]
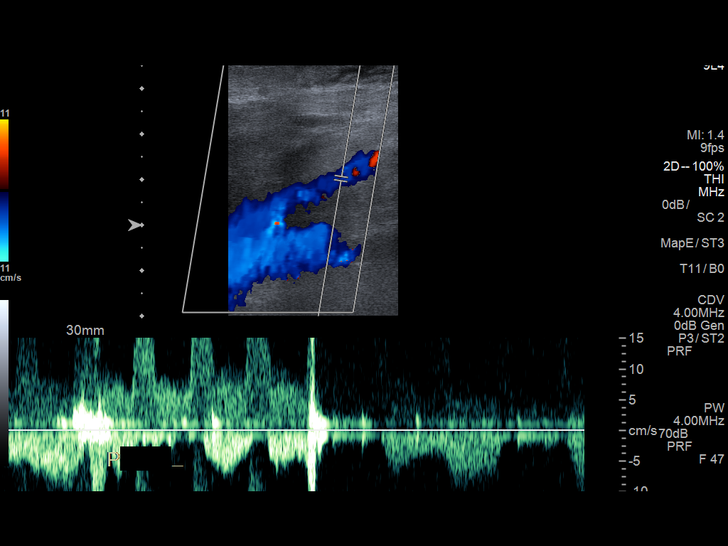

[13 of 24 positions shown; findings below may reference images not displayed]

FINDINGS: RIGHT Deep Venous System:

Evaluation of the deep venous system including the common femoral,
femoral, profunda femoral, popliteal and calf veins (where visible)
demonstrate no evidence of deep venous thrombosis. The vessels are
compressible and demonstrate normal respiratory phasicity and
response to augmentation. No evidence of deep venous reflux.

RIGHT Superficial Venous System:

SFJ: Within normal limits

GSV Prox Thigh: Within normal limits

GSV Mid Thigh: Not evaluated

GSV Lower Thigh: Within normal limits

GSV Prox Calf: Within normal limits

GSV Mid Calf: Not evaluated

GSV Distal Calf: Within normal limits

SSV Prox: Within normal limits

LEFT Deep Venous System:

Evaluation of the deep venous system including the common femoral,
femoral, profunda femoral, popliteal and calf veins (where visible)
demonstrate no evidence of deep venous thrombosis. The vessels are
compressible and demonstrate normal respiratory phasicity and
response to augmentation. No evidence of deep venous reflux.

LEFT Superficial Venous System:

SFJ:   Within normal limits

GSV Prox Thigh: Within normal limits

GSV Mid Thigh: Not evaluated

GSV Lower Thigh: Within normal limits

GSV Prox Calf: Within normal limits

GSV Mid Calf: Not evaluated

GSV Distal Calf: Within normal limits

SSV Prox: Within normal limits

Other:
IMPRESSION: 1. No evidence of a deep venous thrombosis.
2. No evidence of deep or superficial venous insufficiency.
3. Bilateral superficial subcutaneous edema.

## 2017-05-26 ENCOUNTER — Telehealth: Payer: Self-pay | Admitting: *Deleted

## 2017-05-26 NOTE — Telephone Encounter (Signed)
Received call from patient.   Requested refill on Oxycodone.  Ok to refill??  Last office visit 05/05/2017.  Last refill 04/22/2017.

## 2017-05-27 MED ORDER — OXYCODONE-ACETAMINOPHEN 7.5-325 MG PO TABS: ORAL_TABLET | ORAL | 0 refills | Status: DC

## 2017-05-27 NOTE — Telephone Encounter (Signed)
Okay to refill? 

## 2017-05-27 NOTE — Telephone Encounter (Signed)
Prescription printed.   Call placed to patient. No answer. No VM.

## 2017-05-27 NOTE — Telephone Encounter (Signed)
Call placed to patient and patient made aware.  

## 2017-06-02 DIAGNOSIS — I890 Lymphedema, not elsewhere classified: Secondary | ICD-10-CM | POA: Diagnosis not present

## 2017-06-02 DIAGNOSIS — R609 Edema, unspecified: Secondary | ICD-10-CM | POA: Diagnosis not present

## 2017-06-11 ENCOUNTER — Other Ambulatory Visit: Payer: Self-pay | Admitting: Family Medicine

## 2017-06-16 ENCOUNTER — Telehealth: Payer: Self-pay | Admitting: *Deleted

## 2017-06-16 MED ORDER — OXYCODONE-ACETAMINOPHEN 7.5-325 MG PO TABS: ORAL_TABLET | ORAL | 0 refills | Status: DC

## 2017-06-16 NOTE — Telephone Encounter (Signed)
Okay to post date the refill

## 2017-06-16 NOTE — Telephone Encounter (Signed)
Prescription printed and patient made aware to come to office to pick up on Wed per patient request.

## 2017-06-16 NOTE — Telephone Encounter (Signed)
Received call from patient.   Requested refill on Oxycodone to be post dated for refill.   Ok to refill??  Last office visit 05/05/2017.  Last refill 05/27/2017.

## 2017-06-18 ENCOUNTER — Encounter: Payer: Self-pay | Admitting: Cardiology

## 2017-06-18 ENCOUNTER — Ambulatory Visit (INDEPENDENT_AMBULATORY_CARE_PROVIDER_SITE_OTHER): Payer: Medicare Other | Admitting: Cardiology

## 2017-06-18 VITALS — BP 136/74 | HR 89 | Ht 59.0 in | Wt 197.0 lb

## 2017-06-18 DIAGNOSIS — I5043 Acute on chronic combined systolic (congestive) and diastolic (congestive) heart failure: Secondary | ICD-10-CM

## 2017-06-18 NOTE — Progress Notes (Signed)
Clinical Summary [name] is a 71 y.o.male seen today for follow up of the following medical problems.   1. Chronic diastolic heart failure - echo 05/2015 with normal LVEF 60-65%, grade II diastolic dysfunction - Jan 2017 echo LVEF 84-66%, grade I diastolic dysfunction - edema likely combined diastolic HF and lymphedema    - last clinic weight with [place] 11/2016 186 lbs. Today by our scale 197 lbs.  - pcp had increased metolazone to 2 times a week. He takes the best he can, however does miss doses at times   2. Chest pain - mainly with laying down. Indigestion like feelin, better with pepto.  Past Medical History:  Diagnosis Date  . Arthritis   . Congestive heart disease ([place]) 11/2015  . COPD (chronic obstructive pulmonary disease) ([place])   . DDD (degenerative disc disease), lumbosacral   . Depression   . Diabetic neuropathy ([place])   . Diastolic dysfunction   . Essential hypertension   . GERD (gastroesophageal reflux disease)   . Hyperlipidemia   . Iron deficiency anemia   . Left knee DJD   . Lumbar spinal stenosis   . Lymphedema 11/2015   BOTH LEGS  . NASH (nonalcoholic steatohepatitis)   . Peripheral edema   . Peripheral edema 10/05/2015  . Type 2 diabetes mellitus (HCC)      No Known Allergies   Current Outpatient Prescriptions  Medication Sig Dispense Refill  . aspirin EC 81 MG tablet Take 81 mg by mouth daily.    . B-D ULTRAFINE III SHORT PEN 31G X 8 MM MISC USE AS DIRECTED DAILY WITH LANTUS. 100 each 0  . benazepril (LOTENSIN) 40 MG tablet TAKE ONE TABLET BY MOUTH DAILY. 90 tablet 0  . Blood Glucose Monitoring Suppl (ACCU-CHEK AVIVA PLUS) w/Device KIT Use as directed to monitor FSBS 2x daily. Dx: E11.9. 1 kit 1  . bumetanide (BUMEX) 2 MG tablet TAKE 1&1/2 TABLETS BY MOUTH TWICE DAILY. (Patient taking differently: TAKE 2 MG (1 TABLET) TABLETS BY MOUTH TWICE DAILY.) 135 tablet 3  . carvedilol (COREG) 3.125 MG tablet TAKE 1 TABLET BY MOUTH TWO TIMES DAILY  WITH A MEAL. 60 tablet 3  . diazepam (VALIUM) 5 MG tablet TAKE 1 TABLET BY MOUTH AT BEDTIME AS NEEDED FOR ANXIETY. 30 tablet 2  . diclofenac sodium (VOLTAREN) 1 % GEL Apply 4 g topically 4 (four) times daily as needed (pain). 100 g 3  . Ferrous Sulfate (IRON) 325 (65 FE) MG TABS Take 1 tablet by mouth daily.     Marland Kitchen gabapentin (NEURONTIN) 400 MG capsule TAKE ONE CAPSULE BY MOUTH THREE TIMES DAILY. 90 capsule 2  . glucose blood (ACCU-CHEK AVIVA PLUS) test strip Use as directed to monitor FSBS 2x daily. Dx: E11.9. 150 each 1  . ibuprofen (ADVIL,MOTRIN) 800 MG tablet Take 800 mg by mouth 3 (three) times daily as needed. For pain    . Insulin Glargine (LANTUS SOLOSTAR) 100 UNIT/ML Solostar Pen Inject 20 Units into the skin daily at 10 pm. 15 mL 0  . levothyroxine (SYNTHROID, LEVOTHROID) 88 MCG tablet TAKE 1 TABLET BY MOUTH DAILY BEFORE BREAKFAST. 30 tablet 0  . magnesium oxide (MAG-OX) 400 MG tablet Take 1 tablet (400 mg total) by mouth daily. 40 tablet 3  . meloxicam (MOBIC) 15 MG tablet TAKE ONE TABLET BY MOUTH DAILY WITH FOOD. 90 tablet 1  . metFORMIN (GLUCOPHAGE) 1000 MG tablet TAKE ONE TABLET BY MOUTH TWICE DAILY WITH A MEAL. 180 tablet 3  .  metolazone (ZAROXOLYN) 2.5 MG tablet Take 1 tablet (2.5 mg total) by mouth daily. 30 tablet 3  . nystatin (MYCOSTATIN/NYSTOP) powder Apply topically 4 (four) times daily. 15 g 0  . nystatin cream (MYCOSTATIN) APPLY TO AFFECTED AREA TWICE DAILY. 30 g 0  . omeprazole (PRILOSEC) 20 MG capsule TAKE ONE CAPSULE BY MOUTH TWICE DAILY. 180 capsule 1  . oxyCODONE-acetaminophen (PERCOCET) 7.5-325 MG tablet Take one tablet by mouth every 6 hours as needed for severe pain. Max APAP 3gm/24hrs from all sources. Chronic Pain. Dx: G89.4 120 tablet 0  . potassium chloride (K-DUR) 10 MEQ tablet Take 1 tablet (10 mEq total) by mouth 2 (two) times daily. 60 tablet 3  . pravastatin (PRAVACHOL) 40 MG tablet TAKE ONE TABLET BY MOUTH DAILY. (Patient taking differently: TAKE ONE TABLET  BY MOUTH DAILY IN THE EVENING) 90 tablet 0  . PROLENSA 0.07 % SOLN Place 1 drop into both eyes as directed. To complete course on left 08/05/16 and to begin therapy for right eye on 07/19/16 (once daily)     No current facility-administered medications for this visit.      Past Surgical History:  Procedure Laterality Date  . BIOPSY N/A 07/21/2014   Procedure: BIOPSY;  Surgeon: Daneil Dolin, MD;  Location: [facility];  Service: Endoscopy;  Laterality: N/A;  . CATARACT EXTRACTION W/PHACO Right 07/08/2016   Procedure: CATARACT EXTRACTION PHACO AND INTRAOCULAR LENS PLACEMENT RIGHT EYE;  Surgeon: Tonny , MD;  Location: [facility];  Service: Ophthalmology;  Laterality: Right;  CDE: 9.01  . CATARACT EXTRACTION W/PHACO Left 07/22/2016   Procedure: CATARACT EXTRACTION PHACO AND INTRAOCULAR LENS PLACEMENT LEFT EYE CDE=10.69;  Surgeon: Tonny , MD;  Location: [facility];  Service: Ophthalmology;  Laterality: Left;  left -   . COLONOSCOPY  2011   [facility]: normal colon, normal distal ileum  . COLONOSCOPY WITH PROPOFOL N/A 07/21/2014   Procedure: ATTEMPTED COLONOSCOPY WITH PROPOFOL-HAD TO STOP DUE TO BRADYCARDIA;  Surgeon: Daneil Dolin, MD;  Location: [facility];  Service: Endoscopy;  Laterality: N/A;  . EGD with enteroscopy  2011   [facility]: normal esophagus and stomach. Normal duodenum, jejunum. No evidence of AVMs.   . ESOPHAGOGASTRODUODENOSCOPY (EGD) WITH PROPOFOL N/A 07/21/2014   Procedure: ESOPHAGOGASTRODUODENOSCOPY (EGD) WITH PROPOFOL;  Surgeon: Daneil Dolin, MD;  Location: [facility];  Service: Endoscopy;  Laterality: N/A;  . HERNIA REPAIR    . KNEE ARTHROSCOPY WITH MEDIAL MENISECTOMY Left 11/06/2012   Procedure: KNEE ARTHROSCOPY WITH MEDIAL MENISECTOMY;  Surgeon: Carole Civil, MD;  Location: [facility];  Service: Orthopedics;  Laterality: Left;  . Lipoma removal     Stomach  . LUMBAR LAMINECTOMY/DECOMPRESSION MICRODISCECTOMY Left 08/30/2013   Procedure: LUMBAR LAMINECTOMY/DECOMPRESSION  MICRODISCECTOMY LEFT  LUMBAR TWO THREE;  Surgeon: Otilio Connors, MD;  Location: [facility];  Service: Neurosurgery;  Laterality: Left;  . SHOULDER SURGERY     Rght-rotator cuff  . TOOTH EXTRACTION       No Known Allergies    Family History  Problem Relation Age of Onset  . Heart disease Mother   . Hyperlipidemia Mother   . Hypertension Mother   . Depression Mother   . Diabetes Mother   . Rectal cancer Mother   . Cancer Mother   . Heart disease Father   . Hypertension Father   . Hyperlipidemia Father   . Diabetes Father   . Cancer Father   . Heart disease Sister   . Hyperlipidemia Sister   . Hypertension  Sister   . Diabetes Sister   . Diabetes Brother   . Heart disease Sister   . Hyperlipidemia Sister   . Hypertension Sister   . Heart disease Sister   . Hyperlipidemia Sister   . Hypertension Sister   . Diabetes Sister   . Diabetes Brother      Social History Mr. Espina reports that he quit smoking about 17 years ago. His smoking use included Cigarettes. He started smoking about 62 years ago. He has a 110.00 pack-year smoking history. He has never used smokeless tobacco. Mr. Allston reports that he does not drink alcohol.   Review of Systems CONSTITUTIONAL: No weight loss, fever, chills, weakness or fatigue.  HEENT: Eyes: No visual loss, blurred vision, double vision or yellow sclerae.No hearing loss, sneezing, congestion, runny nose or sore throat.  SKIN: No rash or itching.  CARDIOVASCULAR: per hpi RESPIRATORY: No shortness of breath, cough or sputum.  GASTROINTESTINAL: No anorexia, nausea, vomiting or diarrhea. No abdominal pain or blood.  GENITOURINARY: No burning on urination, no polyuria NEUROLOGICAL: No headache, dizziness, syncope, paralysis, ataxia, numbness or tingling in the extremities. No change in bowel or bladder control.  MUSCULOSKELETAL: No muscle, back pain, joint pain or stiffness.  LYMPHATICS: No enlarged nodes. No history of splenectomy.    PSYCHIATRIC: No history of depression or anxiety.  ENDOCRINOLOGIC: No reports of sweating, cold or heat intolerance. No polyuria or polydipsia.  Marland Kitchen   Physical Examination Vitals:   06/18/17 0843  BP: 136/74  Pulse: 89  SpO2: 98%   Vitals:   06/18/17 0843  Weight: 197 lb (89.4 kg)  Height: 4' 11"  (1.499 m)    Gen: resting comfortably, no acute distress HEENT: no scleral icterus, pupils equal round and reactive, no palptable cervical adenopathy,  CV: RRR, no m/r/g, no jvd Resp: Clear to auscultation bilaterally GI: abdomen is soft, non-tender, non-distended, normal bowel sounds, no hepatosplenomegaly MSK: extremities are warm, no edema.  Skin: warm, no rash Neuro:  no focal deficits Psych: appropriate affect   Diagnostic Studies 05/2015 echo Study Conclusions  - Left ventricle: The cavity size was normal. Wall thickness was increased in a pattern of mild LVH. Systolic function was normal. The estimated ejection fraction was in the range of 60% to 65%. Wall motion was normal; there were no regional wall motion abnormalities. Features are consistent with a pseudonormal left ventricular filling pattern, with concomitant abnormal relaxation and increased filling pressure (grade 2 diastolic dysfunction). Doppler parameters are consistent with high ventricular filling pressure. - Aortic valve: Mildly calcified annulus. - Mitral valve: Mildly calcified annulus. There was trivial regurgitation. - Left atrium: The atrium was mildly to moderately dilated. Volume/bsa, S: 34.4 ml/m^2. Volume/bsa, ES (1-plane Simpson&'s, A4C): 31.3 ml/m^2.  Jan 2017 Study Conclusions  - Left ventricle: The cavity size was mildly dilated. Wall thickness was increased in a pattern of mild LVH. Systolic function was normal. The estimated ejection fraction was in the range of 50% to 55%. Wall motion was normal; there were no regional wall motion abnormalities.  Doppler parameters are consistent with abnormal left ventricular relaxation (grade 1 diastolic dysfunction). - Mitral valve: Calcified annulus. Mildly thickened leaflets . - Atrial septum: No defect or patent foramen ovale was identified.    Assessment and Plan  1. LE edema/Acute on Chronic diastolic HF - likely combination of lymphedema and acute on chronic diastolic HF - some recent increase in weight and swelling. Agree with recent increase of metolazone to 2 times weekly, encouraged increased compliance  -  repeat BMET/Mg in 2 weeks     F/u 3 months   [name], M.D

## 2017-06-18 NOTE — Patient Instructions (Signed)
Medication Instructions:  Your physician recommends that you continue on your current medications as directed. Please refer to the Current Medication list given to you today.  Labwork: 2 WEEKS BMET MAGNESIUM   Testing/Procedures: NONE  Follow-Up: Your physician recommends that you schedule a follow-up appointment in: 3 MONTHS    Any Other Special Instructions Will Be Listed Below (If Applicable).     If you need a refill on your cardiac medications before your next appointment, please call your pharmacy.

## 2017-06-20 ENCOUNTER — Ambulatory Visit (INDEPENDENT_AMBULATORY_CARE_PROVIDER_SITE_OTHER): Payer: Medicare Other | Admitting: Family Medicine

## 2017-06-20 VITALS — BP 130/68 | HR 82 | Temp 98.8°F | Resp 14 | Wt 195.0 lb

## 2017-06-20 DIAGNOSIS — Z23 Encounter for immunization: Secondary | ICD-10-CM

## 2017-06-20 DIAGNOSIS — Z66 Do not resuscitate: Secondary | ICD-10-CM

## 2017-06-20 DIAGNOSIS — E038 Other specified hypothyroidism: Secondary | ICD-10-CM | POA: Diagnosis not present

## 2017-06-20 DIAGNOSIS — E1149 Type 2 diabetes mellitus with other diabetic neurological complication: Secondary | ICD-10-CM

## 2017-06-20 DIAGNOSIS — F320 Major depressive disorder, single episode, mild: Secondary | ICD-10-CM | POA: Diagnosis not present

## 2017-06-20 DIAGNOSIS — I10 Essential (primary) hypertension: Secondary | ICD-10-CM

## 2017-06-20 DIAGNOSIS — E782 Mixed hyperlipidemia: Secondary | ICD-10-CM | POA: Diagnosis not present

## 2017-06-20 DIAGNOSIS — I890 Lymphedema, not elsewhere classified: Secondary | ICD-10-CM

## 2017-06-20 DIAGNOSIS — Z0000 Encounter for general adult medical examination without abnormal findings: Secondary | ICD-10-CM

## 2017-06-20 MED ORDER — TRAZODONE HCL 50 MG PO TABS: 25.0000 mg | ORAL_TABLET | Freq: Every evening | ORAL | 6 refills | Status: DC | PRN

## 2017-06-20 NOTE — Patient Instructions (Addendum)
We will check on Shingrix vaccine  We will call with lab results FLu shot given  Trazodone - try for sleep Get the compression hose  We will look for the special tread socks  F/U 4 months

## 2017-06-20 NOTE — Progress Notes (Signed)
Subjective:   Patient presents for Medicare Annual/Subsequent preventive examination.       DM- A1C normal 2 months ago 5.6% I decreased lantus to 20 units o  CBG this AM 95, no hypoglyemia symptoms , did not bringhis meter  He needs new prescription for his lymphedema lower extremeties CHF- He admitted was not taking metalazone twice a week, but is now Reviewed cardiology note  States he has been depressed at times and not sleeping well, nothing specific, but cant do as much for himself. Does not feel anxious. Denies SI.   enjoys his caregiver     Review Past Medical/Family/Social: Per EMR    Risk Factors  Current exercise habits: NONE Dietary issues discussed: yes  Cardiac risk factors: Obesity (BMI >= 30 kg/m2). CHF, DM  Depression Screen  SEE PHQ 9 (Note: if answer to either of the following is "Yes", a more complete depression screening is indicated)  Over the past two weeks, have you felt down, depressed or hopeless?yes Over the past two weeks, have you felt little interest or pleasure in doing things? No Have you lost interest or pleasure in daily life? No Do you often feel hopeless? No Do you cry easily over simple problems? No   Activities of Daily Living  In your present state of health, do you have any difficulty performing the following activities?:  Driving? No  Managing money? No  Feeding yourself? No  Getting from bed to [employer] a flight of stairs? Yes Preparing food and eating?: Yes Bathing or showering? Yes Getting dressed: No  Getting to the toilet? No  Using the toilet:No  Moving around from place to place:Yes In the past year have you fallen or had a near fall?:No  Are you sexually active? No  Do you have more than one partner? No   Hearing Difficulties: No  Do you often ask people to speak up or repeat themselves? No  Do you experience ringing or noises in your ears? No Do you have difficulty understanding soft or whispered voices?  No  Do you feel that you have a problem with memory? No Do you often misplace items? No  Do you feel safe at home? Yes  Cognitive Testing  Alert? Yes Normal Appearance?Yes  Oriented to person? Yes Place? Yes  Time? Yes  Recall of three objects? Yes  Can perform simple calculations? Yes  Displays appropriate judgment?Yes  Can read the correct time from a watch face?Yes   List the Names of Other Physician/Practitioners you currently use:  [facility], [facility]   Screening Tests / Date UTD- Needs flu shot today Colonoscopy                     Zostavax [facility] pNEUMONIA UTD  Influenza Vaccine  Tetanus/tdap  ROS:  GEN- denies fatigue, fever, weight loss,weakness, recent illness HEENT- denies eye drainage, change in vision, nasal discharge, CVS- denies chest pain, palpitations RESP- denies SOB, cough, wheeze ABD- denies N/V, change in stools, abd pain GU- denies dysuria, hematuria, dribbling, incontinence MSK- + joint pain, muscle aches, injury Neuro- denies headache, dizziness, syncope, seizure activity  Physical: GEN- NAD, alert and oriented x3,sitting in wheelchair  HEENT- PERRL, EOMI, non injected sclera, pink conjunctiva, MMM, oropharynx clear, TM clear bilat , no effusion Neck- Supple, no thryomegaly, NO JVD CVS- RRR, no murmur RESP-CTAB ABD-NABS,soft,NT,ND Psych- normal affect and mood EXT- chronic 1+ edema,venous stasis changes Pulses- Radial 2+, DP-diminished  Assessment:    Annual  wellness medicare exam   Plan:    During the course of the visit the patient was educated and counseled about appropriate screening and preventive services including:  Flu shot  Shingles vaccine. Will put on list, if insurance covers  Mild depression/difficulty sleeping trial of trazadone 25-20m at bedtime Chronic pain syndrome- continue pain medication Chronic lymphedema- given script for compression hose again, legs are smaller than when he tried to get fitted  before DM- well controlled, no change to lantus, no hypoglycemia episodes  He has advanced directives, states he is DNR   Fasting labs  Diet review for nutrition referral? Yes ____ Not Indicated __x__  Patient Instructions (the written plan) was given to the patient.  Medicare Attestation  I have personally reviewed:  The patient's medical and social history  Their use of alcohol, tobacco or illicit drugs  Their current medications and supplements  The patient's functional ability including ADLs,fall risks, home safety risks, cognitive, and hearing and visual impairment  Diet and physical activities  Evidence for depression or mood disorders  The patient's weight, height, BMI, and visual acuity have been recorded in the chart. I have made referrals, counseling, and provided education to the patient based on review of the above and I have provided the patient with a written personalized care plan for preventive services.

## 2017-06-21 ENCOUNTER — Encounter: Payer: Self-pay | Admitting: Family Medicine

## 2017-06-21 LAB — T3, FREE: T3, Free: 3 pg/mL (ref 2.3–4.2)

## 2017-06-21 LAB — HEMOGLOBIN A1C
EAG (MMOL/L): 6.8 (calc)
Hgb A1c MFr Bld: 5.9 % of total Hgb — ABNORMAL HIGH (ref <5.7)
Mean Plasma Glucose: 123 (calc)

## 2017-06-21 LAB — T4, FREE: Free T4: 1.4 ng/dL (ref 0.8–1.8)

## 2017-06-21 LAB — LIPID PANEL
Cholesterol: 145 mg/dL (ref <200)
HDL: 37 mg/dL — AB (ref >40)
LDL CHOLESTEROL (CALC): 71 mg/dL
NON-HDL CHOLESTEROL (CALC): 108 mg/dL (ref <130)
TRIGLYCERIDES: 282 mg/dL — AB (ref <150)
Total CHOL/HDL Ratio: 3.9 (calc) (ref <5.0)

## 2017-06-21 LAB — TSH: TSH: 4.25 m[IU]/L (ref 0.40–4.50)

## 2017-06-25 ENCOUNTER — Telehealth: Payer: Self-pay

## 2017-06-25 NOTE — Telephone Encounter (Signed)
-----   Message from [name], MD sent at 06/20/2017  1:01 PM EDT ----- Can you let Mr Deardorff know we have a pair of hospital socks available for him when he wants to pick them up.  [name] MD

## 2017-06-25 NOTE — Telephone Encounter (Signed)
Called pt. No answer. Left message to inform pt. That we are going to mail him some hospital socks that Dr. Harl Bowie has gotten for him. Asked him to call back with any questions.

## 2017-07-01 ENCOUNTER — Encounter: Payer: Self-pay | Admitting: *Deleted

## 2017-07-11 ENCOUNTER — Other Ambulatory Visit: Payer: Self-pay | Admitting: Family Medicine

## 2017-07-14 NOTE — Telephone Encounter (Signed)
Ok to refill??      LRF  - 06/11/17

## 2017-07-14 NOTE — Telephone Encounter (Signed)
Okay to refill? 

## 2017-07-15 DIAGNOSIS — I5043 Acute on chronic combined systolic (congestive) and diastolic (congestive) heart failure: Secondary | ICD-10-CM | POA: Diagnosis not present

## 2017-07-15 LAB — MAGNESIUM: Magnesium: 1.3 mg/dL — ABNORMAL LOW (ref 1.5–2.5)

## 2017-07-15 LAB — BASIC METABOLIC PANEL
BUN: 13 mg/dL (ref 7–25)
CALCIUM: 8.7 mg/dL (ref 8.6–10.3)
CO2: 31 mmol/L (ref 20–32)
Chloride: 98 mmol/L (ref 98–110)
Creat: 1.03 mg/dL (ref 0.70–1.18)
GLUCOSE: 98 mg/dL (ref 65–99)
Potassium: 4.4 mmol/L (ref 3.5–5.3)
SODIUM: 137 mmol/L (ref 135–146)

## 2017-07-15 NOTE — Telephone Encounter (Signed)
Medication called to pharmacy. 

## 2017-07-22 ENCOUNTER — Telehealth: Payer: Self-pay | Admitting: *Deleted

## 2017-07-22 MED ORDER — OXYCODONE-ACETAMINOPHEN 7.5-325 MG PO TABS: ORAL_TABLET | ORAL | 0 refills | Status: DC

## 2017-07-22 NOTE — Telephone Encounter (Signed)
Received call from patient.   Requested refill on Oxycodone.   Ok to refill??  Last office visit 06/20/2017.  Last refill 06/16/2017, post dated to 06/27/2017.

## 2017-07-22 NOTE — Telephone Encounter (Signed)
Prescription printed and patient made aware to come to office to pick up.

## 2017-07-22 NOTE — Telephone Encounter (Signed)
Okay to refill? 

## 2017-07-30 ENCOUNTER — Telehealth: Payer: Self-pay | Admitting: Cardiology

## 2017-07-30 NOTE — Telephone Encounter (Signed)
Patient would like to speak with nurse regarding concerns he would like her to go over with Dr.Branch. / tg

## 2017-07-31 NOTE — Telephone Encounter (Signed)
Unable to LM

## 2017-07-31 NOTE — Telephone Encounter (Signed)
Pt states that he will now be seeing [facility] in [place]. Seeing Dr. Henreitta Cea. States she would like office notes. # [phone] T . Fax # [phone]. Pt to come by office and sign release. [facility] would like clinic notes, medication changes for the last year.

## 2017-08-16 ENCOUNTER — Other Ambulatory Visit: Payer: Self-pay | Admitting: Family Medicine

## 2017-08-18 ENCOUNTER — Telehealth: Payer: Self-pay | Admitting: *Deleted

## 2017-08-18 MED ORDER — OXYCODONE-ACETAMINOPHEN 7.5-325 MG PO TABS: ORAL_TABLET | ORAL | 0 refills | Status: DC

## 2017-08-18 NOTE — Telephone Encounter (Signed)
Received call from patient.   Requested refill on Percocet.   Ok to refill??  Last office visit 06/20/2017.  Last refill 07/22/2017.

## 2017-08-18 NOTE — Telephone Encounter (Signed)
Ok to refill Diazepam??  Last office visit 06/20/2017.  Last refill 07/15/2017.

## 2017-08-18 NOTE — Telephone Encounter (Signed)
Okay to refill? 

## 2017-08-18 NOTE — Telephone Encounter (Signed)
Prescription printed and patient made aware to come to office to pick up on 08/19/2017.

## 2017-08-18 NOTE — Telephone Encounter (Signed)
Medication called to pharmacy. 

## 2017-08-18 NOTE — Telephone Encounter (Signed)
Okay to refill valium give 2 refills

## 2017-08-25 ENCOUNTER — Other Ambulatory Visit: Payer: Self-pay | Admitting: Family Medicine

## 2017-09-15 ENCOUNTER — Telehealth: Payer: Self-pay | Admitting: *Deleted

## 2017-09-15 NOTE — Telephone Encounter (Signed)
Okay to refill? 

## 2017-09-15 NOTE — Telephone Encounter (Signed)
Received call from patient.    Requested refill on Oxycodone.   Ok to refill??  Last office visit 06/20/2017.  Last refill 08/18/2017.

## 2017-09-16 MED ORDER — OXYCODONE-ACETAMINOPHEN 7.5-325 MG PO TABS: ORAL_TABLET | ORAL | 0 refills | Status: DC

## 2017-09-16 NOTE — Telephone Encounter (Signed)
Prescription printed and patient made aware to come to office to pick up after 2pm on 09/16/2017.

## 2017-09-21 ENCOUNTER — Other Ambulatory Visit: Payer: Self-pay | Admitting: Family Medicine

## 2017-10-01 ENCOUNTER — Ambulatory Visit: Payer: Medicare Other | Admitting: Cardiology

## 2017-10-01 DIAGNOSIS — E1151 Type 2 diabetes mellitus with diabetic peripheral angiopathy without gangrene: Secondary | ICD-10-CM | POA: Diagnosis not present

## 2017-10-01 DIAGNOSIS — E1140 Type 2 diabetes mellitus with diabetic neuropathy, unspecified: Secondary | ICD-10-CM | POA: Diagnosis not present

## 2017-10-03 ENCOUNTER — Telehealth: Payer: Self-pay | Admitting: *Deleted

## 2017-10-03 NOTE — Telephone Encounter (Signed)
Received call from patient.   Requested letter from MD stating that he requires medications such as Diazepam and Oxycodone/APAP to help subsidize the rent. States that landlord will reduce rent to accommodate the co-pay for the medications that patient has to take that is not covered by [place].   MD please advise.

## 2017-10-04 NOTE — Telephone Encounter (Signed)
Okay to write letter stating he is prescribed these medications and are needed to maintain overall health and quality and life. Take this into consideration with regards to cost of rent.

## 2017-10-06 ENCOUNTER — Encounter: Payer: Self-pay | Admitting: *Deleted

## 2017-10-06 NOTE — Telephone Encounter (Signed)
Letter sent.

## 2017-10-13 ENCOUNTER — Other Ambulatory Visit: Payer: Self-pay | Admitting: *Deleted

## 2017-10-13 MED ORDER — OXYCODONE-ACETAMINOPHEN 7.5-325 MG PO TABS: ORAL_TABLET | ORAL | 0 refills | Status: DC

## 2017-10-13 NOTE — Telephone Encounter (Signed)
Received call from patient.   Requested refill on Oxycodone/APAP.   Ok to refill??  Last office visit 06/20/2017.  Last refill 09/16/2017.

## 2017-10-18 ENCOUNTER — Other Ambulatory Visit: Payer: Self-pay | Admitting: Family Medicine

## 2017-10-21 ENCOUNTER — Ambulatory Visit (INDEPENDENT_AMBULATORY_CARE_PROVIDER_SITE_OTHER): Payer: Medicare Other | Admitting: Family Medicine

## 2017-10-21 ENCOUNTER — Other Ambulatory Visit: Payer: Self-pay

## 2017-10-21 ENCOUNTER — Encounter: Payer: Self-pay | Admitting: Family Medicine

## 2017-10-21 VITALS — BP 138/68 | HR 94 | Temp 98.5°F | Resp 16

## 2017-10-21 DIAGNOSIS — G622 Polyneuropathy due to other toxic agents: Secondary | ICD-10-CM

## 2017-10-21 DIAGNOSIS — I10 Essential (primary) hypertension: Secondary | ICD-10-CM | POA: Diagnosis not present

## 2017-10-21 DIAGNOSIS — E1149 Type 2 diabetes mellitus with other diabetic neurological complication: Secondary | ICD-10-CM

## 2017-10-21 DIAGNOSIS — I890 Lymphedema, not elsewhere classified: Secondary | ICD-10-CM

## 2017-10-21 DIAGNOSIS — Z23 Encounter for immunization: Secondary | ICD-10-CM

## 2017-10-21 DIAGNOSIS — E782 Mixed hyperlipidemia: Secondary | ICD-10-CM | POA: Diagnosis not present

## 2017-10-21 DIAGNOSIS — N390 Urinary tract infection, site not specified: Secondary | ICD-10-CM

## 2017-10-21 DIAGNOSIS — M5137 Other intervertebral disc degeneration, lumbosacral region: Secondary | ICD-10-CM

## 2017-10-21 MED ORDER — CEPHALEXIN 500 MG PO CAPS
500.0000 mg | ORAL_CAPSULE | Freq: Two times a day (BID) | ORAL | 0 refills | Status: DC
Start: 2017-10-21 — End: 2018-03-04

## 2017-10-21 NOTE — Addendum Note (Signed)
Addended by: Sheral Flow on: 10/21/2017 12:43 PM   Modules accepted: Orders

## 2017-10-21 NOTE — Assessment & Plan Note (Signed)
Restart diuretics as prescribed by cardiology

## 2017-10-21 NOTE — Progress Notes (Signed)
   Subjective:    Patient ID: Michael [name], male    DOB: [birth year], 72 y.o.   MRN: [id number]  Patient presents for Follow-up (is not fasting) and Dysuria (unable to leave sample- reports dark urine, burning with urination)   Pt here to f/u chronic medical problems.  He has had burning with uriantion,dark urine for past 10 days, took AZO which helped some , has had subjective chills and feeling bad     DM-  Last A1C 5.9%, on Metformin 1073m Twice a day CBG range 120-139, his lantus was lowered 18 units after last visit in Sept    Last visit started on trazodone to help with sleep, mild depressive symptoms- this has helped sleep improved   Chronic pain- continues on percocet   Medications reviewed   Lymohedema- he did not take fluid pill past few days he was on AZO   [place]   Review Of Systems:  GEN- denies fatigue, fever, weight loss,weakness, recent illness HEENT- denies eye drainage, change in vision, nasal discharge, CVS- denies chest pain, palpitations RESP- denies SOB, cough, wheeze ABD- denies N/V, change in stools, abd pain GU- +dysuria, hematuria, dribbling, incontinence MSK- denies joint pain, muscle aches, injury Neuro- denies headache, dizziness, syncope, seizure activity       Objective:    BP 138/68   Pulse 94   Temp 98.5 F (36.9 C) (Oral)   Resp 16   SpO2 96%  GEN- NAD, alert and oriented x3 HEENT- PERRL, EOMI, non injected sclera, pink conjunctiva, MMM, oropharynx clear Neck- Supple, no LAD CVS- RRR, no murmur RESP-CTAB ABD-NABS,soft,NT,ND, no CVA tenderness EXT-chronic lymphedema Pulses- Radial 2+        Assessment & Plan:     Shingirx #1 given Problem List Items Addressed This Visit      Unprioritized   Peripheral neuropathy   Hyperlipidemia   Relevant Orders   Lipid panel   DDD (degenerative disc disease), lumbosacral   Type II diabetes mellitus with neurological manifestations ([facility]   Diabetes has been well controlled continue to titrate insulin down, he can safely have A1C 6.5-7%  Check A1C today and fasting lipids, TG quite elevated last check, he had some food       Relevant Orders   CBC with Differential/Platelet   Comprehensive metabolic panel   Hemoglobin A1c   Lymphedema    Restart diuretics as prescribed by cardiology      Essential hypertension    Controlled no changes to medications       Other Visit Diagnoses    Urinary tract infection without hematuria, site unspecified       Presumptive UTI, unable to leave sample with age, comorbities, prefer to go ahead and treat, with keflex   Relevant Medications   cephALEXin (KEFLEX) 500 MG capsule      Note: This dictation was prepared with Dragon dictation along with smaller phrase technology. Any transcriptional errors that result from this process are unintentional.

## 2017-10-21 NOTE — Assessment & Plan Note (Signed)
Controlled no changes to medications

## 2017-10-21 NOTE — Patient Instructions (Signed)
F/U 4 months

## 2017-10-21 NOTE — Assessment & Plan Note (Signed)
Diabetes has been well controlled continue to titrate insulin down, he can safely have A1C 6.5-7%  Check A1C today and fasting lipids, TG quite elevated last check, he had some food

## 2017-10-22 ENCOUNTER — Encounter: Payer: Self-pay | Admitting: Family Medicine

## 2017-10-22 LAB — CBC WITH DIFFERENTIAL/PLATELET
BASOS ABS: 79 {cells}/uL (ref 0–200)
Basophils Relative: 2.2 %
Eosinophils Absolute: 169 cells/uL (ref 15–500)
Eosinophils Relative: 4.7 %
HCT: 34.6 % — ABNORMAL LOW (ref 38.5–50.0)
Hemoglobin: 12.1 g/dL — ABNORMAL LOW (ref 13.2–17.1)
LYMPHS ABS: 1192 {cells}/uL (ref 850–3900)
MCH: 30.2 pg (ref 27.0–33.0)
MCHC: 35 g/dL (ref 32.0–36.0)
MCV: 86.3 fL (ref 80.0–100.0)
MONOS PCT: 7.4 %
MPV: 10.1 fL (ref 7.5–12.5)
NEUTROS PCT: 52.6 %
Neutro Abs: 1894 cells/uL (ref 1500–7800)
PLATELETS: 175 10*3/uL (ref 140–400)
RBC: 4.01 10*6/uL — AB (ref 4.20–5.80)
RDW: 13 % (ref 11.0–15.0)
TOTAL LYMPHOCYTE: 33.1 %
WBC mixed population: 266 cells/uL (ref 200–950)
WBC: 3.6 10*3/uL — ABNORMAL LOW (ref 3.8–10.8)

## 2017-10-22 LAB — COMPREHENSIVE METABOLIC PANEL
AG Ratio: 2.1 (calc) (ref 1.0–2.5)
ALT: 25 U/L (ref 9–46)
AST: 24 U/L (ref 10–35)
Albumin: 4.2 g/dL (ref 3.6–5.1)
Alkaline phosphatase (APISO): 57 U/L (ref 40–115)
BILIRUBIN TOTAL: 0.3 mg/dL (ref 0.2–1.2)
BUN: 11 mg/dL (ref 7–25)
CALCIUM: 9.3 mg/dL (ref 8.6–10.3)
CO2: 28 mmol/L (ref 20–32)
CREATININE: 0.83 mg/dL (ref 0.70–1.18)
Chloride: 97 mmol/L — ABNORMAL LOW (ref 98–110)
GLUCOSE: 87 mg/dL (ref 65–99)
Globulin: 2 g/dL (calc) (ref 1.9–3.7)
Potassium: 4.6 mmol/L (ref 3.5–5.3)
SODIUM: 135 mmol/L (ref 135–146)
Total Protein: 6.2 g/dL (ref 6.1–8.1)

## 2017-10-22 LAB — LIPID PANEL
CHOL/HDL RATIO: 3.9 (calc) (ref <5.0)
CHOLESTEROL: 135 mg/dL (ref <200)
HDL: 35 mg/dL — ABNORMAL LOW (ref >40)
LDL CHOLESTEROL (CALC): 69 mg/dL
Non-HDL Cholesterol (Calc): 100 mg/dL (calc) (ref <130)
TRIGLYCERIDES: 221 mg/dL — AB (ref <150)

## 2017-10-22 LAB — HEMOGLOBIN A1C
HEMOGLOBIN A1C: 6 %{Hb} — AB (ref <5.7)
MEAN PLASMA GLUCOSE: 126 (calc)
eAG (mmol/L): 7 (calc)

## 2017-10-23 ENCOUNTER — Other Ambulatory Visit: Payer: Self-pay | Admitting: *Deleted

## 2017-10-23 MED ORDER — INSULIN GLARGINE 100 UNIT/ML SOLOSTAR PEN: 10.0000 [IU] | PEN_INJECTOR | Freq: Every day | SUBCUTANEOUS | 0 refills | Status: DC

## 2017-11-04 ENCOUNTER — Ambulatory Visit: Payer: Medicare Other | Admitting: Cardiology

## 2017-11-04 ENCOUNTER — Encounter: Payer: Self-pay | Admitting: Cardiology

## 2017-11-04 VITALS — BP 142/70 | HR 85 | Ht 59.0 in | Wt 208.0 lb

## 2017-11-04 DIAGNOSIS — I5032 Chronic diastolic (congestive) heart failure: Secondary | ICD-10-CM

## 2017-11-04 DIAGNOSIS — R600 Localized edema: Secondary | ICD-10-CM

## 2017-11-04 DIAGNOSIS — Z79899 Other long term (current) drug therapy: Secondary | ICD-10-CM | POA: Diagnosis not present

## 2017-11-04 DIAGNOSIS — R0789 Other chest pain: Secondary | ICD-10-CM | POA: Diagnosis not present

## 2017-11-04 MED ORDER — METOLAZONE 2.5 MG PO TABS: ORAL_TABLET | ORAL | 3 refills | Status: DC

## 2017-11-04 MED ORDER — PANTOPRAZOLE SODIUM 40 MG PO TBEC: 40.0000 mg | DELAYED_RELEASE_TABLET | Freq: Every day | ORAL | 11 refills | Status: DC

## 2017-11-04 NOTE — Progress Notes (Signed)
Clinical Summary [name] is a 72 y.o.male seen today for follow up of the following medical problems.   1. Chronic diastolic heart failure - echo 05/2015 with normal LVEF 60-65%, grade II diastolic dysfunction - Jan 2017 echo LVEF 08-67%, grade I diastolic dysfunction - edema likely combined diastolic HF and lymphedema   - last clinic weight 05/2017 197 lbs - increased eating over the holiday. Limited exercise.Some increased edema - off fluid pills for a period due to UTI. Now back on as of last week.  - has on bumex 28m bid, metolazone prn.     2. Chest pain - mainly with laying down. Indigestion like feelin, better with pepto - episode of indigestion, better with drinking something cold. Better with pepto bismol - mainly happens at night.   Past Medical History:  Diagnosis Date  . Arthritis   . Congestive heart disease ([facility] 11/2015  . COPD (chronic obstructive pulmonary disease) ([facility]   . DDD (degenerative disc disease), lumbosacral   . Depression   . Diabetic neuropathy ([facility]   . Diastolic dysfunction   . Essential hypertension   . GERD (gastroesophageal reflux disease)   . Hyperlipidemia   . Iron deficiency anemia   . Left knee DJD   . Lumbar spinal stenosis   . Lymphedema 11/2015   BOTH LEGS  . NASH (nonalcoholic steatohepatitis)   . Peripheral edema   . Peripheral edema 10/05/2015  . Type 2 diabetes mellitus (HCC)      No Known Allergies   Current Outpatient Medications  Medication Sig Dispense Refill  . aspirin EC 81 MG tablet Take 81 mg by mouth daily.    . B-D ULTRAFINE III SHORT PEN 31G X 8 MM MISC USE AS DIRECTED DAILY WITH LANTUS. 100 each 0  . benazepril (LOTENSIN) 40 MG tablet TAKE ONE TABLET BY MOUTH DAILY. 90 tablet 0  . Blood Glucose Monitoring Suppl (ACCU-CHEK AVIVA PLUS) w/Device KIT Use as directed to monitor FSBS 2x daily. Dx: E11.9. 1 kit 1  . bumetanide (BUMEX) 2 MG tablet TAKE 1&1/2 TABLETS BY MOUTH TWICE DAILY. (Patient taking  differently: TAKE 2 MG (1 TABLET) TABLETS BY MOUTH TWICE DAILY.) 135 tablet 3  . carvedilol (COREG) 3.125 MG tablet TAKE 1 TABLET BY MOUTH TWO TIMES DAILY WITH A MEAL. 60 tablet 0  . cephALEXin (KEFLEX) 500 MG capsule Take 1 capsule (500 mg total) by mouth 2 (two) times daily. 14 capsule 0  . diazepam (VALIUM) 5 MG tablet TAKE 1 TABLET BY MOUTH AT BEDTIME AS NEEDED FOR ANXIETY. 30 tablet 2  . diclofenac sodium (VOLTAREN) 1 % GEL Apply 4 g topically 4 (four) times daily as needed (pain). 100 g 3  . Ferrous Sulfate (IRON) 325 (65 FE) MG TABS Take 1 tablet by mouth daily.     .Marland Kitchengabapentin (NEURONTIN) 400 MG capsule TAKE ONE CAPSULE BY MOUTH THREE TIMES DAILY. 90 capsule 2  . glucose blood (ACCU-CHEK AVIVA PLUS) test strip Use as directed to monitor FSBS 2x daily. Dx: E11.9. 150 each 1  . ibuprofen (ADVIL,MOTRIN) 800 MG tablet Take 800 mg by mouth 3 (three) times daily as needed. For pain    . Insulin Glargine (LANTUS SOLOSTAR) 100 UNIT/ML Solostar Pen Inject 10 Units into the skin daily at 10 pm. 15 mL 0  . levothyroxine (SYNTHROID, LEVOTHROID) 88 MCG tablet TAKE 1 TABLET BY MOUTH DAILY BEFORE BREAKFAST. 30 tablet 0  . magnesium oxide (MAG-OX) 400 MG tablet Take 1 tablet (400  mg total) by mouth daily. 40 tablet 3  . meloxicam (MOBIC) 15 MG tablet TAKE ONE TABLET BY MOUTH DAILY WITH FOOD. 90 tablet 1  . metFORMIN (GLUCOPHAGE) 1000 MG tablet TAKE ONE TABLET BY MOUTH TWICE DAILY WITH A MEAL. 180 tablet 3  . metolazone (ZAROXOLYN) 2.5 MG tablet Take 1 tablet (2.5 mg total) by mouth daily. 30 tablet 3  . nystatin (MYCOSTATIN/NYSTOP) powder Apply topically 4 (four) times daily. 15 g 0  . nystatin cream (MYCOSTATIN) APPLY TO AFFECTED AREA TWICE DAILY. 30 g 0  . omeprazole (PRILOSEC) 20 MG capsule TAKE ONE CAPSULE BY MOUTH TWICE DAILY. 180 capsule 1  . oxyCODONE-acetaminophen (PERCOCET) 7.5-325 MG tablet Take one tablet by mouth every 6 hours as needed for severe pain. Chronic Pain. Dx: G89.4 120 tablet 0  .  potassium chloride (K-DUR) 10 MEQ tablet Take 1 tablet (10 mEq total) by mouth 2 (two) times daily. 60 tablet 3  . pravastatin (PRAVACHOL) 40 MG tablet TAKE ONE TABLET BY MOUTH DAILY. (Patient taking differently: TAKE ONE TABLET BY MOUTH DAILY IN THE EVENING) 90 tablet 0  . PROLENSA 0.07 % SOLN Place 1 drop into both eyes as directed. To complete course on left 08/05/16 and to begin therapy for right eye on 07/19/16 (once daily)    . traZODone (DESYREL) 50 MG tablet Take 0.5-1 tablets (25-50 mg total) by mouth at bedtime as needed for sleep. 30 tablet 6   No current facility-administered medications for this visit.      Past Surgical History:  Procedure Laterality Date  . BIOPSY N/A 07/21/2014   Procedure: BIOPSY;  Surgeon: Daneil Dolin, MD;  Location: [facility];  Service: Endoscopy;  Laterality: N/A;  . CATARACT EXTRACTION W/PHACO Right 07/08/2016   Procedure: CATARACT EXTRACTION PHACO AND INTRAOCULAR LENS PLACEMENT RIGHT EYE;  Surgeon: Tonny , MD;  Location: [facility];  Service: Ophthalmology;  Laterality: Right;  CDE: 9.01  . CATARACT EXTRACTION W/PHACO Left 07/22/2016   Procedure: CATARACT EXTRACTION PHACO AND INTRAOCULAR LENS PLACEMENT LEFT EYE CDE=10.69;  Surgeon: Tonny , MD;  Location: [facility];  Service: Ophthalmology;  Laterality: Left;  left -   . COLONOSCOPY  2011   [facility]: normal colon, normal distal ileum  . COLONOSCOPY WITH PROPOFOL N/A 07/21/2014   Procedure: ATTEMPTED COLONOSCOPY WITH PROPOFOL-HAD TO STOP DUE TO BRADYCARDIA;  Surgeon: Daneil Dolin, MD;  Location: [facility];  Service: Endoscopy;  Laterality: N/A;  . EGD with enteroscopy  2011   [facility]: normal esophagus and stomach. Normal duodenum, jejunum. No evidence of AVMs.   . ESOPHAGOGASTRODUODENOSCOPY (EGD) WITH PROPOFOL N/A 07/21/2014   Procedure: ESOPHAGOGASTRODUODENOSCOPY (EGD) WITH PROPOFOL;  Surgeon: Daneil Dolin, MD;  Location: [facility];  Service: Endoscopy;  Laterality: N/A;  . HERNIA REPAIR    . KNEE  ARTHROSCOPY WITH MEDIAL MENISECTOMY Left 11/06/2012   Procedure: KNEE ARTHROSCOPY WITH MEDIAL MENISECTOMY;  Surgeon: Carole Civil, MD;  Location: [facility];  Service: Orthopedics;  Laterality: Left;  . Lipoma removal     Stomach  . LUMBAR LAMINECTOMY/DECOMPRESSION MICRODISCECTOMY Left 08/30/2013   Procedure: LUMBAR LAMINECTOMY/DECOMPRESSION MICRODISCECTOMY LEFT  LUMBAR TWO THREE;  Surgeon: Otilio Connors, MD;  Location: [facility];  Service: Neurosurgery;  Laterality: Left;  . SHOULDER SURGERY     Rght-rotator cuff  . TOOTH EXTRACTION       No Known Allergies    Family History  Problem Relation Age of Onset  . Heart disease Mother   . Hyperlipidemia Mother   .  Hypertension Mother   . Depression Mother   . Diabetes Mother   . Rectal cancer Mother   . Cancer Mother   . Heart disease Father   . Hypertension Father   . Hyperlipidemia Father   . Diabetes Father   . Cancer Father   . Heart disease Sister   . Hyperlipidemia Sister   . Hypertension Sister   . Diabetes Sister   . Diabetes Brother   . Heart disease Sister   . Hyperlipidemia Sister   . Hypertension Sister   . Heart disease Sister   . Hyperlipidemia Sister   . Hypertension Sister   . Diabetes Sister   . Diabetes Brother      Social History Mr. Nance reports that he quit smoking about 18 years ago. His smoking use included cigarettes. He started smoking about 62 years ago. He has a 110.00 pack-year smoking history. he has never used smokeless tobacco. Mr. Abair reports that he does not drink alcohol.   Review of Systems CONSTITUTIONAL: No weight loss, fever, chills, weakness or fatigue.  HEENT: Eyes: No visual loss, blurred vision, double vision or yellow sclerae.No hearing loss, sneezing, congestion, runny nose or sore throat.  SKIN: No rash or itching.  CARDIOVASCULAR: per hpi RESPIRATORY: No shortness of breath, cough or sputum.  GASTROINTESTINAL: No anorexia, nausea, vomiting or diarrhea. No  abdominal pain or blood.  GENITOURINARY: No burning on urination, no polyuria NEUROLOGICAL: No headache, dizziness, syncope, paralysis, ataxia, numbness or tingling in the extremities. No change in bowel or bladder control.  MUSCULOSKELETAL: No muscle, back pain, joint pain or stiffness.  LYMPHATICS: No enlarged nodes. No history of splenectomy.  PSYCHIATRIC: No history of depression or anxiety.  ENDOCRINOLOGIC: No reports of sweating, cold or heat intolerance. No polyuria or polydipsia.  Marland Kitchen   Physical Examination Vitals:   11/04/17 0856  BP: (!) 142/70  Pulse: 85  SpO2: 98%   Vitals:   11/04/17 0856  Weight: 208 lb (94.3 kg)  Height: _0  (1.499 m)    Gen: resting comfortably, no acute distress HEENT: no scleral icterus, pupils equal round and reactive, no palptable cervical adenopathy,  CV: RRR, no m/r/g, no jvd Resp: Clear to auscultation bilaterally GI: abdomen is soft, non-tender, non-distended, normal bowel sounds, no hepatosplenomegaly MSK: extremities are warm, 2+ bilateral LE edema  Skin: warm, no rash Neuro:  no focal deficits Psych: appropriate affect   Diagnostic Studies 05/2015 echo Study Conclusions  - Left ventricle: The cavity size was normal. Wall thickness was increased in a pattern of mild LVH. Systolic function was normal. The estimated ejection fraction was in the range of 60% to 65%. Wall motion was normal; there were no regional wall motion abnormalities. Features are consistent with a pseudonormal left ventricular filling pattern, with concomitant abnormal relaxation and increased filling pressure (grade 2 diastolic dysfunction). Doppler parameters are consistent with high ventricular filling pressure. - Aortic valve: Mildly calcified annulus. - Mitral valve: Mildly calcified annulus. There was trivial regurgitation. - Left atrium: The atrium was mildly to moderately dilated. Volume/bsa, S: 34.4 ml/m^2. Volume/bsa, ES  (1-plane Simpson&'s, A4C): 31.3 ml/m^2.  Jan 2017 Study Conclusions  - Left ventricle: The cavity size was mildly dilated. Wall thickness was increased in a pattern of mild LVH. Systolic function was normal. The estimated ejection fraction was in the range of 50% to 55%. Wall motion was normal; there were no regional wall motion abnormalities. Doppler parameters are consistent with abnormal left ventricular relaxation (grade 1 diastolic dysfunction). -  Mitral valve: Calcified annulus. Mildly thickened leaflets . - Atrial septum: No defect or patent foramen ovale was identified.    Assessment and Plan   1. LE edema/Acute on Chronic diastolic HF - likely combination of lymphedema and acute on chronic diastolic HF - increased weight and edema - continue bumex, start metolazone 2.90m twice weekly. Check BMET/Mg in 2 weeks.   2. Chest pain - noncardiac, consistent with GI etiology - change omeprazole to pantroprazole.    JArnoldo Lenis M.D.

## 2017-11-04 NOTE — Patient Instructions (Signed)
Medication Instructions:  Your physician has recommended you make the following change in your medication:  Stop Taking Omeprazole  Start Taking Protonix 40 mg Daily  Start Metolazone 2.5 mg on Tues. And Thurs.    Labwork: Your physician recommends that you return for lab work in: 2 Weeks 11/18/17    Testing/Procedures: NONE   Follow-Up: Your physician recommends that you schedule a follow-up appointment in: 2 Weeks    Any Other Special Instructions Will Be Listed Below (If Applicable).     If you need a refill on your cardiac medications before your next appointment, please call your pharmacy. Thank you for choosing [facility]!

## 2017-11-07 ENCOUNTER — Encounter: Payer: Self-pay | Admitting: Cardiology

## 2017-11-15 ENCOUNTER — Other Ambulatory Visit: Payer: Self-pay | Admitting: Family Medicine

## 2017-11-17 NOTE — Telephone Encounter (Signed)
Last OV 10/21/2017 Last refill 10/13/2017 Okay to refill?

## 2017-11-18 ENCOUNTER — Other Ambulatory Visit (HOSPITAL_COMMUNITY)
Admission: RE | Admit: 2017-11-18 | Discharge: 2017-11-18 | Disposition: A | Discharge status: Home / Self Care | Payer: Medicare Other | Source: Ambulatory Visit | Attending: Cardiology | Admitting: Cardiology

## 2017-11-18 DIAGNOSIS — Z79899 Other long term (current) drug therapy: Secondary | ICD-10-CM | POA: Insufficient documentation

## 2017-11-18 LAB — BASIC METABOLIC PANEL
ANION GAP: 13 (ref 5–15)
BUN: 14 mg/dL (ref 6–20)
CHLORIDE: 94 mmol/L — AB (ref 101–111)
CO2: 27 mmol/L (ref 22–32)
CREATININE: 1.04 mg/dL (ref 0.61–1.24)
Calcium: 9.3 mg/dL (ref 8.9–10.3)
GFR calc non Af Amer: >60 mL/min (ref >60)
Glucose, Bld: 166 mg/dL — ABNORMAL HIGH (ref 65–99)
POTASSIUM: 3.6 mmol/L (ref 3.5–5.1)
Sodium: 134 mmol/L — ABNORMAL LOW (ref 135–145)

## 2017-11-18 LAB — MAGNESIUM: Magnesium: 1.3 mg/dL — ABNORMAL LOW (ref 1.7–2.4)

## 2017-11-18 NOTE — Progress Notes (Signed)
Cardiology Office Note    Date:  11/19/2017   ID:  [name], Broad May 27, 1946, MRN [id number]  PCP:  [name], MD  Cardiologist: [name], MD  Chief Complaint  Patient presents with  . Follow-up    History of Present Illness:  Michael [name] is a 72 y.o. male with history of chronic diastolic CHF and lymphedema as well as noncardiac chest pain.  Recently saw Dr. Harl Bowie 11/04/17 and metolazone 2.5 mg was started twice weekly.  Last 2D echo 2017 LVEF 50-55% with grade 1 DD.  Weight was 208 pounds.  Also has COPD, HLD, DM type II.  Patient comes in today feeling much better.  He has lost 16 pounds.  His weight is 192 pounds today.  Lab work done yesterday potassium 3.6 creatinine normal at 1.04 magnesium low at 1.3    Past Medical History:  Diagnosis Date  . Arthritis   . Congestive heart disease (Michael [name]) 11/2015  . COPD (chronic obstructive pulmonary disease) (Michael [name])   . DDD (degenerative disc disease), lumbosacral   . Depression   . Diabetic neuropathy (Michael [name])   . Diastolic dysfunction   . Essential hypertension   . GERD (gastroesophageal reflux disease)   . Hyperlipidemia   . Iron deficiency anemia   . Left knee DJD   . Lumbar spinal stenosis   . Lymphedema 11/2015   BOTH LEGS  . NASH (nonalcoholic steatohepatitis)   . Peripheral edema   . Peripheral edema 10/05/2015  . Type 2 diabetes mellitus (Michael [name])     Past Surgical History:  Procedure Laterality Date  . BIOPSY N/A 07/21/2014   Procedure: BIOPSY;  Surgeon: Michael Dolin, MD;  Location: [facility];  Service: Endoscopy;  Laterality: N/A;  . CATARACT EXTRACTION W/PHACO Right 07/08/2016   Procedure: CATARACT EXTRACTION PHACO AND INTRAOCULAR LENS PLACEMENT RIGHT EYE;  Surgeon: Michael Branch, MD;  Location: [facility];  Service: Ophthalmology;  Laterality: Right;  CDE: 9.01  . CATARACT EXTRACTION W/PHACO Left 07/22/2016   Procedure: CATARACT EXTRACTION PHACO AND INTRAOCULAR LENS PLACEMENT LEFT EYE CDE=10.69;  Surgeon:  Michael Branch, MD;  Location: [facility];  Service: Ophthalmology;  Laterality: Left;  left -   . COLONOSCOPY  2011   [facility]: normal colon, normal distal ileum  . COLONOSCOPY WITH PROPOFOL N/A 07/21/2014   Procedure: ATTEMPTED COLONOSCOPY WITH PROPOFOL-HAD TO STOP DUE TO BRADYCARDIA;  Surgeon: Michael Dolin, MD;  Location: [facility];  Service: Endoscopy;  Laterality: N/A;  . EGD with enteroscopy  2011   [facility]: normal esophagus and stomach. Normal duodenum, jejunum. No evidence of AVMs.   . ESOPHAGOGASTRODUODENOSCOPY (EGD) WITH PROPOFOL N/A 07/21/2014   Procedure: ESOPHAGOGASTRODUODENOSCOPY (EGD) WITH PROPOFOL;  Surgeon: Michael Dolin, MD;  Location: [facility];  Service: Endoscopy;  Laterality: N/A;  . HERNIA REPAIR    . KNEE ARTHROSCOPY WITH MEDIAL MENISECTOMY Left 11/06/2012   Procedure: KNEE ARTHROSCOPY WITH MEDIAL MENISECTOMY;  Surgeon: Michael Civil, MD;  Location: [facility];  Service: Orthopedics;  Laterality: Left;  . Lipoma removal     Stomach  . LUMBAR LAMINECTOMY/DECOMPRESSION MICRODISCECTOMY Left 08/30/2013   Procedure: LUMBAR LAMINECTOMY/DECOMPRESSION MICRODISCECTOMY LEFT  LUMBAR TWO THREE;  Surgeon: Michael Connors, MD;  Location: [facility];  Service: Neurosurgery;  Laterality: Left;  . SHOULDER SURGERY     Rght-rotator cuff  . TOOTH EXTRACTION      Current Medications: Current Meds  Medication Sig  . aspirin EC 81 MG tablet Take 81 mg by  mouth daily.  . B-D ULTRAFINE III SHORT PEN 31G X 8 MM MISC USE AS DIRECTED DAILY WITH LANTUS.  Michael [name] benazepril (LOTENSIN) 40 MG tablet TAKE ONE TABLET BY MOUTH DAILY.  Michael [name] Blood Glucose Monitoring Suppl (ACCU-CHEK AVIVA PLUS) w/Device KIT Use as directed to monitor FSBS 2x daily. Dx: E11.9.  . bumetanide (BUMEX) 2 MG tablet TAKE 1&1/2 TABLETS BY MOUTH TWICE DAILY. (Patient taking differently: TAKE 2 MG (1 TABLET) TABLETS BY MOUTH TWICE DAILY.)  . carvedilol (COREG) 3.125 MG tablet TAKE 1 TABLET BY MOUTH TWO TIMES DAILY WITH A MEAL.  . cephALEXin  (KEFLEX) 500 MG capsule Take 1 capsule (500 mg total) by mouth 2 (two) times daily.  . diazepam (VALIUM) 5 MG tablet TAKE 1 TABLET BY MOUTH AT BEDTIME AS NEEDED FOR ANXIETY.  . diclofenac sodium (VOLTAREN) 1 % GEL Apply 4 g topically 4 (four) times daily as needed (pain).  . Ferrous Sulfate (IRON) 325 (65 FE) MG TABS Take 1 tablet by mouth daily.   Michael [name] gabapentin (NEURONTIN) 400 MG capsule TAKE ONE CAPSULE BY MOUTH THREE TIMES DAILY.  Michael [name] glucose blood (ACCU-CHEK AVIVA PLUS) test strip Use as directed to monitor FSBS 2x daily. Dx: E11.9.  Michael [name] ibuprofen (ADVIL,MOTRIN) 800 MG tablet Take 800 mg by mouth 3 (three) times daily as needed. For pain  . Insulin Glargine (LANTUS SOLOSTAR) 100 UNIT/ML Solostar Pen Inject 10 Units into the skin daily at 10 pm.  . levothyroxine (SYNTHROID, LEVOTHROID) 88 MCG tablet TAKE 1 TABLET BY MOUTH DAILY BEFORE BREAKFAST.  . magnesium oxide (MAG-OX) 400 MG tablet Take 1 tablet (400 mg total) by mouth daily.  . meloxicam (MOBIC) 15 MG tablet TAKE ONE TABLET BY MOUTH DAILY WITH FOOD.  Michael [name] metFORMIN (GLUCOPHAGE) 1000 MG tablet TAKE ONE TABLET BY MOUTH TWICE DAILY WITH A MEAL.  Michael [name] metolazone (ZAROXOLYN) 2.5 MG tablet TAKE EVERY Tuesday AND Thursday  . nystatin (MYCOSTATIN/NYSTOP) powder Apply topically 4 (four) times daily.  Michael [name] nystatin cream (MYCOSTATIN) APPLY TO AFFECTED AREA TWICE DAILY.  Michael [name] oxyCODONE-acetaminophen (PERCOCET) 7.5-325 MG tablet TAKE 1 TABLET BY MOUTH EVERY 6 HOURS AS NEEDED FOR SEVERE PAIN.  Michael [name] pantoprazole (PROTONIX) 40 MG tablet Take 1 tablet (40 mg total) by mouth daily.  . potassium chloride (K-DUR) 10 MEQ tablet Take 1 tablet (10 mEq total) by mouth 2 (two) times daily.  . pravastatin (PRAVACHOL) 40 MG tablet TAKE ONE TABLET BY MOUTH DAILY. (Patient taking differently: TAKE ONE TABLET BY MOUTH DAILY IN THE EVENING)  . PROLENSA 0.07 % SOLN Place 1 drop into both eyes as directed. To complete course on left 08/05/16 and to begin therapy for right eye on 07/19/16  (once daily)  . traZODone (DESYREL) 50 MG tablet Take 0.5-1 tablets (25-50 mg total) by mouth at bedtime as needed for sleep.     Allergies:   Patient has no known allergies.   Social History   Socioeconomic History  . Marital status: Widowed    Spouse name: None  . Number of children: None  . Years of education: 19  . Highest education level: None  Social Needs  . Financial resource strain: None  . Food insecurity - worry: None  . Food insecurity - inability: None  . Transportation needs - medical: None  . Transportation needs - non-medical: None  Occupational History  . None  Tobacco Use  . Smoking status: Former Smoker    Packs/day: 2.50    Years: 44.00    Pack years: 110.00  Types: Cigarettes    Start date: 05/07/1955    Last attempt to quit: 10/01/1999    Years since quitting: 18.1  . Smokeless tobacco: Never Used  Substance and Sexual Activity  . Alcohol use: No    Alcohol/week: 0.0 oz  . Drug use: No  . Sexual activity: Yes    Birth control/protection: None  Other Topics Concern  . None  Social History Narrative  . None     Family History:  The patient's family history includes Cancer in his father and mother; Depression in his mother; Diabetes in his brother, brother, father, mother, sister, and sister; Heart disease in his father, mother, sister, sister, and sister; Hyperlipidemia in his father, mother, sister, sister, and sister; Hypertension in his father, mother, sister, sister, and sister; Rectal cancer in his mother.   ROS:   Please see the history of present illness.    Review of Systems  Constitution: Negative.  HENT: Negative.   Eyes: Negative.   Cardiovascular: Positive for leg swelling.  Respiratory: Negative.   Endocrine: Negative.   Hematologic/Lymphatic: Negative.   Skin: Positive for rash.       Yeast under abdominal fat  Musculoskeletal: Negative.  Negative for joint pain.  Gastrointestinal: Negative.   Genitourinary: Negative.     Neurological: Negative.    All other systems reviewed and are negative.   PHYSICAL EXAM:   VS:  BP 136/76   Pulse 86   Ht 4' 11"  (1.499 m)   Wt 192 lb (87.1 kg)   SpO2 98%   BMI 38.78 kg/m   Physical Exam  GEN: Obese, in no acute distress  Neck: no JVD, carotid bruits, or masses Cardiac:RRR; 1/6 systolic murmur Respiratory:  clear to auscultation bilaterally, normal work of breathing GI: soft, nontender, nondistended, + BS, yeast infection under abdominal fat pad Ext: +2 edema bilaterally without cyanosis, clubbing, decreased distal pulses bilaterally Neuro:  Alert and Oriented x 3  Psych: euthymic mood, full affect  Wt Readings from Last 3 Encounters:  11/19/17 192 lb (87.1 kg)  11/04/17 208 lb (94.3 kg)  06/20/17 195 lb (88.5 kg)      Studies/Labs Reviewed:   EKG:  EKG is not ordered today.   Recent Labs: 06/20/2017: TSH 4.25 10/21/2017: ALT 25; Hemoglobin 12.1; Platelets 175 11/18/2017: BUN 14; Creatinine, Ser 1.04; Magnesium 1.3; Potassium 3.6; Sodium 134   Lipid Panel    Component Value Date/Time   CHOL 135 10/21/2017 0946   TRIG 221 (H) 10/21/2017 0946   HDL 35 (L) 10/21/2017 0946   CHOLHDL 3.9 10/21/2017 0946   VLDL 22 04/10/2016 1019   LDLCALC 55 04/10/2016 1019    Additional studies/ records that were reviewed today include:    Jan 2017 Study Conclusions  - Left ventricle: The cavity size was mildly dilated. Wall   thickness was increased in a pattern of mild LVH. Systolic   function was normal. The estimated ejection fraction was in the   range of 50% to 55%. Wall motion was normal; there were no   regional wall motion abnormalities. Doppler parameters are   consistent with abnormal left ventricular relaxation (grade 1   diastolic dysfunction). - Mitral valve: Calcified annulus. Mildly thickened leaflets . - Atrial septum: No defect or patent foramen ovale was identified.          ASSESSMENT:    1. Chronic diastolic CHF (congestive heart  failure) ([place])   2. Essential hypertension   3. OBESITY, MORBID   4. Mixed  hyperlipidemia      PLAN:  In order of problems listed above:  Chronic diastolic CHF on Bumex and metolazone 2.5 mg twice weekly.  Patient is lost 16 pounds and is doing much better.  He still has some edema but will always have a certain amount of chronic edema.  Renal function stable.  Potassium borderline low and magnesium low.  Will increase potassium to 20 mEq twice daily and magnesium 400 mg twice daily.  Repeat be met in 3 weeks and follow-up with Dr. Harl Bowie in 3 months  Essential hypertension controlled  Morbid obesity in a wheelchair.  Weight loss would be helpful to his overall lymphedema  Mixed hyperlipidemia recent LDL 69 triglycerides are still elevated to 21 continue pravastatin and consider fenofibrate in the future    Medication Adjustments/Labs and Tests Ordered: Current medicines are reviewed at length with the patient today.  Concerns regarding medicines are outlined above.  Medication changes, Labs and Tests ordered today are listed in the Patient Instructions below. There are no Patient Instructions on file for this visit.   Sumner Boast, PA-C  11/19/2017 11:37 AM    [facility] [address], [place], [place]  [address] Phone: [phone]; Fax: [phone]

## 2017-11-19 ENCOUNTER — Encounter: Payer: Self-pay | Admitting: Physician Assistant

## 2017-11-19 ENCOUNTER — Ambulatory Visit (INDEPENDENT_AMBULATORY_CARE_PROVIDER_SITE_OTHER): Payer: Medicare Other | Admitting: Physician Assistant

## 2017-11-19 VITALS — BP 136/76 | HR 86 | Ht 59.0 in | Wt 192.0 lb

## 2017-11-19 DIAGNOSIS — I10 Essential (primary) hypertension: Secondary | ICD-10-CM | POA: Diagnosis not present

## 2017-11-19 DIAGNOSIS — E782 Mixed hyperlipidemia: Secondary | ICD-10-CM

## 2017-11-19 DIAGNOSIS — I5032 Chronic diastolic (congestive) heart failure: Secondary | ICD-10-CM | POA: Diagnosis not present

## 2017-11-19 MED ORDER — POTASSIUM CHLORIDE ER 10 MEQ PO TBCR: 10.0000 meq | EXTENDED_RELEASE_TABLET | Freq: Two times a day (BID) | ORAL | 3 refills | Status: DC

## 2017-11-19 MED ORDER — MAGNESIUM OXIDE 400 MG PO TABS: 400.0000 mg | ORAL_TABLET | Freq: Two times a day (BID) | ORAL | 11 refills | Status: DC

## 2017-11-19 NOTE — Patient Instructions (Signed)
Medication Instructions:  Your physician has recommended you make the following change in your medication: Increase Magnesium to 400 mg Two Times Daily  Increase Potassium to 10 mEq to Two Times Daily    Labwork: Your physician recommends that you return for lab work in: 3 weeks    Testing/Procedures: NONE  Follow-Up: Your physician recommends that you schedule a follow-up appointment in: 2-3 Months with Dr. Harl Bowie.    Any Other Special Instructions Will Be Listed Below (If Applicable).  Thank you for choosing [facility]!     If you need a refill on your cardiac medications before your next appointment, please call your pharmacy.

## 2017-12-10 ENCOUNTER — Other Ambulatory Visit (HOSPITAL_COMMUNITY)
Admission: RE | Admit: 2017-12-10 | Discharge: 2017-12-10 | Disposition: A | Discharge status: Home / Self Care | Payer: Medicare Other | Source: Ambulatory Visit | Attending: Physician Assistant | Admitting: Physician Assistant

## 2017-12-10 ENCOUNTER — Other Ambulatory Visit: Payer: Self-pay | Admitting: *Deleted

## 2017-12-10 DIAGNOSIS — I10 Essential (primary) hypertension: Secondary | ICD-10-CM | POA: Insufficient documentation

## 2017-12-10 DIAGNOSIS — I5032 Chronic diastolic (congestive) heart failure: Secondary | ICD-10-CM | POA: Diagnosis not present

## 2017-12-10 LAB — BASIC METABOLIC PANEL
Anion gap: 12 (ref 5–15)
BUN: 20 mg/dL (ref 6–20)
CHLORIDE: 89 mmol/L — AB (ref 101–111)
CO2: 30 mmol/L (ref 22–32)
CREATININE: 1.05 mg/dL (ref 0.61–1.24)
Calcium: 9.2 mg/dL (ref 8.9–10.3)
GFR calc non Af Amer: >60 mL/min (ref >60)
Glucose, Bld: 109 mg/dL — ABNORMAL HIGH (ref 65–99)
Potassium: 3.8 mmol/L (ref 3.5–5.1)
Sodium: 131 mmol/L — ABNORMAL LOW (ref 135–145)

## 2017-12-10 NOTE — Telephone Encounter (Signed)
Received call from patient.   Requested refill on Oxycodone/ APAP.   Ok to refill??  Last office visit 10/21/2017.  Last refill 11/17/2017.

## 2017-12-11 MED ORDER — OXYCODONE-ACETAMINOPHEN 7.5-325 MG PO TABS: ORAL_TABLET | ORAL | 0 refills | Status: DC

## 2017-12-13 ENCOUNTER — Other Ambulatory Visit: Payer: Self-pay | Admitting: Family Medicine

## 2017-12-15 NOTE — Telephone Encounter (Signed)
Ok to refill??  Last office visit 10/21/2017.  Last refill 08/18/2017, #2 refills.

## 2018-01-09 ENCOUNTER — Telehealth: Payer: Self-pay | Admitting: *Deleted

## 2018-01-09 MED ORDER — OXYCODONE-ACETAMINOPHEN 7.5-325 MG PO TABS: ORAL_TABLET | ORAL | 0 refills | Status: DC

## 2018-01-09 NOTE — Telephone Encounter (Signed)
Received call from patient.   Requested refill on Percocet.   Ok to refill??  Last office visit 10/21/2017.  Last refill 12/11/2017.

## 2018-01-18 ENCOUNTER — Ambulatory Visit: Payer: Medicare Other

## 2018-01-18 ENCOUNTER — Other Ambulatory Visit: Payer: Self-pay | Admitting: Family Medicine

## 2018-02-09 ENCOUNTER — Other Ambulatory Visit: Payer: Self-pay | Admitting: *Deleted

## 2018-02-09 MED ORDER — OXYCODONE-ACETAMINOPHEN 7.5-325 MG PO TABS: ORAL_TABLET | ORAL | 0 refills | Status: DC

## 2018-02-09 NOTE — Telephone Encounter (Signed)
Received call from patient.   Requested refill on Oxycodone/APAP.   Ok to refill??  Last office visit 10/21/2017.  Last refill 01/09/2018.

## 2018-02-10 ENCOUNTER — Encounter: Payer: Self-pay | Admitting: Cardiology

## 2018-02-10 ENCOUNTER — Ambulatory Visit: Payer: Medicare Other

## 2018-02-10 ENCOUNTER — Ambulatory Visit: Payer: Medicare Other | Admitting: Cardiology

## 2018-02-10 VITALS — BP 194/104 | HR 89 | Ht 59.0 in | Wt 192.0 lb

## 2018-02-10 DIAGNOSIS — I5033 Acute on chronic diastolic (congestive) heart failure: Secondary | ICD-10-CM | POA: Diagnosis not present

## 2018-02-10 DIAGNOSIS — L03119 Cellulitis of unspecified part of limb: Secondary | ICD-10-CM

## 2018-02-10 DIAGNOSIS — I890 Lymphedema, not elsewhere classified: Secondary | ICD-10-CM | POA: Diagnosis not present

## 2018-02-10 MED ORDER — CEPHALEXIN 500 MG PO CAPS: 500.0000 mg | ORAL_CAPSULE | Freq: Two times a day (BID) | ORAL | 0 refills | Status: AC

## 2018-02-10 NOTE — Progress Notes (Signed)
Clinical Summary Mr. Lax is a 72 y.o.male seen today for follow up of the following medical problems.   1. Chronic diastolic heart failure - echo 05/2015 with normal LVEF 60-65%, grade II diastolic dysfunction - Jan 2017 echo LVEF 32-20%, grade I diastolic dysfunction - edema likely combined diastolic HF and lymphedema   - increaesed SOB, LE edema since last visit. Leg pains.      Past Medical History:  Diagnosis Date  . Arthritis   . Congestive heart disease ([place]) 11/2015  . COPD (chronic obstructive pulmonary disease) ([place])   . DDD (degenerative disc disease), lumbosacral   . Depression   . Diabetic neuropathy ([place])   . Diastolic dysfunction   . Essential hypertension   . GERD (gastroesophageal reflux disease)   . Hyperlipidemia   . Iron deficiency anemia   . Left knee DJD   . Lumbar spinal stenosis   . Lymphedema 11/2015   BOTH LEGS  . NASH (nonalcoholic steatohepatitis)   . Peripheral edema   . Peripheral edema 10/05/2015  . Type 2 diabetes mellitus ([facility])      No Known Allergies   Current Outpatient Medications  Medication Sig Dispense Refill  . aspirin EC 81 MG tablet Take 81 mg by mouth daily.    . B-D ULTRAFINE III SHORT PEN 31G X 8 MM MISC USE AS DIRECTED DAILY WITH LANTUS. 100 each 0  . benazepril (LOTENSIN) 40 MG tablet TAKE ONE TABLET BY MOUTH DAILY. 90 tablet 0  . Blood Glucose Monitoring Suppl (ACCU-CHEK AVIVA PLUS) w/Device KIT Use as directed to monitor FSBS 2x daily. Dx: E11.9. 1 kit 1  . bumetanide (BUMEX) 2 MG tablet TAKE 1&1/2 TABLETS BY MOUTH TWICE DAILY. (Patient taking differently: TAKE 2 MG (1 TABLET) TABLETS BY MOUTH TWICE DAILY.) 135 tablet 3  . carvedilol (COREG) 3.125 MG tablet TAKE 1 TABLET BY MOUTH TWO TIMES DAILY WITH A MEAL. 60 tablet 0  . cephALEXin (KEFLEX) 500 MG capsule Take 1 capsule (500 mg total) by mouth 2 (two) times daily. 14 capsule 0  . diazepam (VALIUM) 5 MG tablet Take 1 tablet (5 mg total) by mouth at bedtime.  TAKE 1 TABLET BY MOUTH AT BEDTIME AS NEEDED FOR ANXIETY. 30 tablet 1  . diclofenac sodium (VOLTAREN) 1 % GEL Apply 4 g topically 4 (four) times daily as needed (pain). 100 g 3  . Ferrous Sulfate (IRON) 325 (65 FE) MG TABS Take 1 tablet by mouth daily.     Marland Kitchen gabapentin (NEURONTIN) 400 MG capsule TAKE ONE CAPSULE BY MOUTH THREE TIMES DAILY. 90 capsule 2  . glucose blood (ACCU-CHEK AVIVA PLUS) test strip Use as directed to monitor FSBS 2x daily. Dx: E11.9. 150 each 1  . ibuprofen (ADVIL,MOTRIN) 800 MG tablet Take 800 mg by mouth 3 (three) times daily as needed. For pain    . Insulin Glargine (LANTUS SOLOSTAR) 100 UNIT/ML Solostar Pen Inject 10 Units into the skin daily at 10 pm. 15 mL 0  . levothyroxine (SYNTHROID, LEVOTHROID) 88 MCG tablet TAKE 1 TABLET BY MOUTH DAILY BEFORE BREAKFAST. 30 tablet 0  . magnesium oxide (MAG-OX) 400 MG tablet Take 1 tablet (400 mg total) by mouth 2 (two) times daily. 60 tablet 11  . meloxicam (MOBIC) 15 MG tablet TAKE ONE TABLET BY MOUTH DAILY WITH FOOD. 90 tablet 1  . metFORMIN (GLUCOPHAGE) 1000 MG tablet TAKE ONE TABLET BY MOUTH TWICE DAILY WITH A MEAL. 180 tablet 3  . metolazone (ZAROXOLYN) 2.5 MG tablet  TAKE EVERY Tuesday AND Thursday 45 tablet 3  . nystatin (MYCOSTATIN/NYSTOP) powder Apply topically 4 (four) times daily. 15 g 0  . nystatin cream (MYCOSTATIN) APPLY TO AFFECTED AREA TWICE DAILY. 30 g 0  . oxyCODONE-acetaminophen (PERCOCET) 7.5-325 MG tablet TAKE 1 TABLET BY MOUTH EVERY 6 HOURS AS NEEDED FOR SEVERE PAIN. 120 tablet 0  . pantoprazole (PROTONIX) 40 MG tablet Take 1 tablet (40 mg total) by mouth daily. 30 tablet 11  . potassium chloride (K-DUR) 10 MEQ tablet Take 1 tablet (10 mEq total) by mouth 2 (two) times daily. 180 tablet 3  . pravastatin (PRAVACHOL) 40 MG tablet TAKE ONE TABLET BY MOUTH DAILY. (Patient taking differently: TAKE ONE TABLET BY MOUTH DAILY IN THE EVENING) 90 tablet 0  . PROLENSA 0.07 % SOLN Place 1 drop into both eyes as directed. To  complete course on left 08/05/16 and to begin therapy for right eye on 07/19/16 (once daily)    . traZODone (DESYREL) 50 MG tablet Take 0.5-1 tablets (25-50 mg total) by mouth at bedtime as needed for sleep. 30 tablet 6  . traZODone (DESYREL) 50 MG tablet TAKE 1/2-1 TABLET BY MOUTH AT BEDTIME AS NEEDED FOR SLEEP. 30 tablet 0   No current facility-administered medications for this visit.      Past Surgical History:  Procedure Laterality Date  . BIOPSY N/A 07/21/2014   Procedure: BIOPSY;  Surgeon: Daneil Dolin, MD;  Location: [facility];  Service: Endoscopy;  Laterality: N/A;  . CATARACT EXTRACTION W/PHACO Right 07/08/2016   Procedure: CATARACT EXTRACTION PHACO AND INTRAOCULAR LENS PLACEMENT RIGHT EYE;  Surgeon: Tonny , MD;  Location: [facility];  Service: Ophthalmology;  Laterality: Right;  CDE: 9.01  . CATARACT EXTRACTION W/PHACO Left 07/22/2016   Procedure: CATARACT EXTRACTION PHACO AND INTRAOCULAR LENS PLACEMENT LEFT EYE CDE=10.69;  Surgeon: Tonny , MD;  Location: [facility];  Service: Ophthalmology;  Laterality: Left;  left -   . COLONOSCOPY  2011   [facility]: normal colon, normal distal ileum  . COLONOSCOPY WITH PROPOFOL N/A 07/21/2014   Procedure: ATTEMPTED COLONOSCOPY WITH PROPOFOL-HAD TO STOP DUE TO BRADYCARDIA;  Surgeon: Daneil Dolin, MD;  Location: [facility];  Service: Endoscopy;  Laterality: N/A;  . EGD with enteroscopy  2011   [facility]: normal esophagus and stomach. Normal duodenum, jejunum. No evidence of AVMs.   . ESOPHAGOGASTRODUODENOSCOPY (EGD) WITH PROPOFOL N/A 07/21/2014   Procedure: ESOPHAGOGASTRODUODENOSCOPY (EGD) WITH PROPOFOL;  Surgeon: Daneil Dolin, MD;  Location: [facility];  Service: Endoscopy;  Laterality: N/A;  . HERNIA REPAIR    . KNEE ARTHROSCOPY WITH MEDIAL MENISECTOMY Left 11/06/2012   Procedure: KNEE ARTHROSCOPY WITH MEDIAL MENISECTOMY;  Surgeon: Carole Civil, MD;  Location: [facility];  Service: Orthopedics;  Laterality: Left;  . Lipoma removal     Stomach  .  LUMBAR LAMINECTOMY/DECOMPRESSION MICRODISCECTOMY Left 08/30/2013   Procedure: LUMBAR LAMINECTOMY/DECOMPRESSION MICRODISCECTOMY LEFT  LUMBAR TWO THREE;  Surgeon: Otilio Connors, MD;  Location: [facility];  Service: Neurosurgery;  Laterality: Left;  . SHOULDER SURGERY     Rght-rotator cuff  . TOOTH EXTRACTION       No Known Allergies    Family History  Problem Relation Age of Onset  . Heart disease Mother   . Hyperlipidemia Mother   . Hypertension Mother   . Depression Mother   . Diabetes Mother   . Rectal cancer Mother   . Cancer Mother   . Heart disease Father   . Hypertension Father   .  Hyperlipidemia Father   . Diabetes Father   . Cancer Father   . Heart disease Sister   . Hyperlipidemia Sister   . Hypertension Sister   . Diabetes Sister   . Diabetes Brother   . Heart disease Sister   . Hyperlipidemia Sister   . Hypertension Sister   . Heart disease Sister   . Hyperlipidemia Sister   . Hypertension Sister   . Diabetes Sister   . Diabetes Brother      Social History Mr. Ormond reports that he quit smoking about 18 years ago. His smoking use included cigarettes. He started smoking about 62 years ago. He has a 110.00 pack-year smoking history. He has never used smokeless tobacco. Mr. River reports that he does not drink alcohol.   Review of Systems CONSTITUTIONAL: No weight loss, fever, chills, weakness or fatigue.  HEENT: Eyes: No visual loss, blurred vision, double vision or yellow sclerae.No hearing loss, sneezing, congestion, runny nose or sore throat.  SKIN: No rash or itching.  CARDIOVASCULAR: no chest pain, no palpitations RESPIRATORY: No shortness of breath, cough or sputum.  GASTROINTESTINAL: No anorexia, nausea, vomiting or diarrhea. No abdominal pain or blood.  GENITOURINARY: No burning on urination, no polyuria NEUROLOGICAL: No headache, dizziness, syncope, paralysis, ataxia, numbness or tingling in the extremities. No change in bowel or bladder  control.  MUSCULOSKELETAL: No muscle, back pain, joint pain or stiffness.  LYMPHATICS: No enlarged nodes. No history of splenectomy.  PSYCHIATRIC: No history of depression or anxiety.  ENDOCRINOLOGIC: No reports of sweating, cold or heat intolerance. No polyuria or polydipsia.  Marland Kitchen   Physical Examination Vitals:   02/10/18 1107  BP: (!) 194/104  Pulse: 89  SpO2: 98%   Vitals:   02/10/18 1107  Weight: 192 lb (87.1 kg)  Height: _0  (1.499 m)    Gen: resting comfortably, no acute distress HEENT: no scleral icterus, pupils equal round and reactive, no palptable cervical adenopathy,  CV: RRR, no m/rg, no jvd Resp: Clear to auscultation bilaterally GI: abdomen is soft, non-tender, non-distended, normal bowel sounds, no hepatosplenomegaly MSK: extremities are warm, no edema.  Skin: warm, no rash Neuro:  no focal deficits Psych: appropriate affect   Diagnostic Studies     Assessment and Plan  1. LE edema/Acute onChronic diastolic HF - likely combination of lymphedema and acute on chronic diastolic HF -increased edema. WE will increase bumebx to 66m tid x 4 days, then resume prior bid dosing. Conitnue metolazone 2 times a week - looks to have some surrounding cellulitis in legs, will Rx keflex 5035mbid x 7 days - check BMET/Mg in 1 week.    F/u 6 weeks. Nursing prior for vitals and weight check.         JoArnoldo LenisM.D.

## 2018-02-10 NOTE — Patient Instructions (Signed)
Medication Instructions:  INCREASE BUMEX TO 3 MG - THREE TIMES DAILY FOR 4 DAYS, THEN RESUME THE 3 MG TWICE DAILY   START KELFEX 500 MG TWO TIMES DAILY FOR 7 DAYS   Labwork: 1 WEEK  BMET \MAGNESIUM   Testing/Procedures: NONE  Follow-Up: Your physician recommends that you schedule a follow-up appointment in: [place]  Your physician recommends that you schedule a follow-up appointment in: 6 WEEKS     Any Other Special Instructions Will Be Listed Below (If Applicable).     If you need a refill on your cardiac medications before your next appointment, please call your pharmacy.

## 2018-02-15 ENCOUNTER — Other Ambulatory Visit: Payer: Self-pay | Admitting: Family Medicine

## 2018-02-15 ENCOUNTER — Encounter: Payer: Self-pay | Admitting: Cardiology

## 2018-02-16 NOTE — Telephone Encounter (Signed)
Ok to refill??  Last office visit 10/21/2017.  Last refill 12/15/2017, #1 refill.

## 2018-02-17 ENCOUNTER — Other Ambulatory Visit (HOSPITAL_COMMUNITY)
Admission: RE | Admit: 2018-02-17 | Discharge: 2018-02-17 | Disposition: A | Discharge status: Home / Self Care | Payer: Medicare Other | Source: Ambulatory Visit | Attending: Cardiology | Admitting: Cardiology

## 2018-02-17 ENCOUNTER — Ambulatory Visit (INDEPENDENT_AMBULATORY_CARE_PROVIDER_SITE_OTHER): Payer: Medicare Other

## 2018-02-17 VITALS — BP 118/60 | HR 89 | Ht 59.0 in | Wt 193.0 lb

## 2018-02-17 DIAGNOSIS — Z0130 Encounter for examination of blood pressure without abnormal findings: Secondary | ICD-10-CM

## 2018-02-17 DIAGNOSIS — I5032 Chronic diastolic (congestive) heart failure: Secondary | ICD-10-CM | POA: Diagnosis not present

## 2018-02-17 LAB — BASIC METABOLIC PANEL
Anion gap: 10 (ref 5–15)
BUN: 24 mg/dL — ABNORMAL HIGH (ref 6–20)
CHLORIDE: 92 mmol/L — AB (ref 101–111)
CO2: 32 mmol/L (ref 22–32)
CREATININE: 1.13 mg/dL (ref 0.61–1.24)
Calcium: 9.2 mg/dL (ref 8.9–10.3)
GFR calc Af Amer: >60 mL/min (ref >60)
GFR calc non Af Amer: >60 mL/min (ref >60)
GLUCOSE: 115 mg/dL — AB (ref 65–99)
Potassium: 4.3 mmol/L (ref 3.5–5.1)
Sodium: 134 mmol/L — ABNORMAL LOW (ref 135–145)

## 2018-02-17 LAB — MAGNESIUM: MAGNESIUM: 1.5 mg/dL — AB (ref 1.7–2.4)

## 2018-02-17 NOTE — Patient Instructions (Signed)
Continue medications as directed     Thank you for choosing [facility] !

## 2018-02-17 NOTE — Progress Notes (Signed)
Here for BP check, BP 118/60, finishing keflex, legs far less swollen and red than before.Pt can slide socks down easily now.Much less pain in legs

## 2018-02-18 ENCOUNTER — Telehealth: Payer: Self-pay

## 2018-02-18 DIAGNOSIS — Z79899 Other long term (current) drug therapy: Secondary | ICD-10-CM

## 2018-02-18 NOTE — Telephone Encounter (Signed)
Pt made aware. He voiced understanding. Will repeat labs in 1 week. He is not totally sure how he is taking his magnesium as his pills are fixed weekly by a family member. I advised him to increase to TID if he is only taking BID. He voiced understanding.

## 2018-02-18 NOTE — Telephone Encounter (Signed)
-----   Message from Charlie Pitter, [place] sent at 02/18/2018 12:32 PM EDT ----- I was granted access to Dr.Branch's box today to help w some of the results.  Patient recently seen for edema/CHF. Dr. Harl Bowie recommended at that time to "ncrease bumebx to 43m tid x 4 days, then resume prior bid dosing. Conitnue metolazone 2 times a week"  Nurse visit yesterday showed improved BP and documents improving symptoms. Labs overall appear stable subtle rise in BUN and magnesium remains low. I would perhaps recommend spreading the metolazone out further between days, such as Mondays and Fridays. If he has been compliant with MagOx, would increase to TID with recheck BMET/MG in 1 week to trend.  Dayna Dunn PA-C

## 2018-02-20 ENCOUNTER — Ambulatory Visit: Payer: Medicare Other | Admitting: Family Medicine

## 2018-03-04 ENCOUNTER — Ambulatory Visit (INDEPENDENT_AMBULATORY_CARE_PROVIDER_SITE_OTHER): Payer: Medicare Other | Admitting: Family Medicine

## 2018-03-04 ENCOUNTER — Encounter: Payer: Self-pay | Admitting: Family Medicine

## 2018-03-04 ENCOUNTER — Other Ambulatory Visit: Payer: Self-pay

## 2018-03-04 VITALS — BP 120/62 | HR 80 | Temp 98.3°F | Resp 16

## 2018-03-04 DIAGNOSIS — M48061 Spinal stenosis, lumbar region without neurogenic claudication: Secondary | ICD-10-CM

## 2018-03-04 DIAGNOSIS — E038 Other specified hypothyroidism: Secondary | ICD-10-CM

## 2018-03-04 DIAGNOSIS — E1149 Type 2 diabetes mellitus with other diabetic neurological complication: Secondary | ICD-10-CM

## 2018-03-04 DIAGNOSIS — I890 Lymphedema, not elsewhere classified: Secondary | ICD-10-CM | POA: Diagnosis not present

## 2018-03-04 DIAGNOSIS — E782 Mixed hyperlipidemia: Secondary | ICD-10-CM | POA: Diagnosis not present

## 2018-03-04 DIAGNOSIS — G622 Polyneuropathy due to other toxic agents: Secondary | ICD-10-CM | POA: Diagnosis not present

## 2018-03-04 MED ORDER — DIAZEPAM 5 MG PO TABS: ORAL_TABLET | ORAL | 3 refills | Status: DC

## 2018-03-04 MED ORDER — ACCU-CHEK AVIVA PLUS W/DEVICE KIT: PACK | 1 refills | Status: DC

## 2018-03-04 MED ORDER — NYSTATIN 100000 UNIT/GM EX CREA: TOPICAL_CREAM | CUTANEOUS | 0 refills | Status: DC

## 2018-03-04 MED ORDER — OXYCODONE-ACETAMINOPHEN 7.5-325 MG PO TABS: ORAL_TABLET | ORAL | 0 refills | Status: DC

## 2018-03-04 MED ORDER — GABAPENTIN 400 MG PO CAPS: 400.0000 mg | ORAL_CAPSULE | Freq: Three times a day (TID) | ORAL | 0 refills | Status: DC

## 2018-03-04 NOTE — Addendum Note (Signed)
Addended by: Sheral Flow on: 03/04/2018 02:50 PM   Modules accepted: Orders

## 2018-03-04 NOTE — Patient Instructions (Signed)
New meter sent to pharmacy 30 days of gabapentin and cream Pain meds/valium refilled F/U Dr. Dennard Schaumann end of Sept

## 2018-03-04 NOTE — Assessment & Plan Note (Signed)
Back to baseline, diuretics per cardiology

## 2018-03-04 NOTE — Assessment & Plan Note (Addendum)
Recheck A1C goal A1C < 7% New meter sent to pharmacy  Continue MTF Continue statin drug for lipidemia

## 2018-03-04 NOTE — Assessment & Plan Note (Signed)
Continue synthroid.

## 2018-03-04 NOTE — Assessment & Plan Note (Signed)
Chronic pain, he is compliant with medications

## 2018-03-04 NOTE — Assessment & Plan Note (Signed)
Continue gabapentin.

## 2018-03-04 NOTE — Progress Notes (Signed)
   Subjective:    Patient ID: Michael [name], male    DOB: 01-03-1946, 72 y.o.   MRN: [id number]  Patient presents for Follow-up (is fasting) and Rash (BLE red- states that he had some smll blisters to lower leg that has resolved)  Pt here to f/u chronic medical problems REVIEWED last cardiology note He has chronic lymphedema but had recurrent blisters on Lower ext treated with antibiotics- keflex  Bumex was also increased temporarly and he was continued on metalazone twice a week He has had some low magnesium, now on mag oxide three times a day    DM- last A1C 6% in January, CBG run , taking Lantus 10 units and metformin which was decreased at last visit, he does not have a glucometer states that it broke.  He denies any hypoglycemia symptoms  Hyperlipidemia- TG have been improving, last check down to 221 in Jan taking pravastatin        Chronic back/ leg pain- taking percocet as prescribed needs refilled, also on valium at bedtime for anxiety/sleep   [facility]   Hypothyroidism- due for recheck on TFT   Review Of Systems:  GEN- denies fatigue, fever, weight loss,weakness, recent illness HEENT- denies eye drainage, change in vision, nasal discharge, CVS- denies chest pain, palpitations RESP- denies SOB, cough, wheeze ABD- denies N/V, change in stools, abd pain GU- denies dysuria, hematuria, dribbling, incontinence MSK- + joint pain, muscle aches, injury Neuro- denies headache, dizziness, syncope, seizure activity       Objective:    BP 120/62   Pulse 80   Temp 98.3 F (36.8 C) (Oral)   Resp 16   SpO2 97%  GEN- NAD, alert and oriented x3, in wheelchair  HEENT- PERRL, EOMI, non injected sclera, pink conjunctiva, MMM, oropharynx clear Neck- Supple, no thyromegaly, no JVD  CVS- RRR, no murmur RESP-CTAB ABD-NABS,soft,NT,ND EXT- chronic lymphedema with venous statis changes, no blisters noted L > R with ballooning  over feet  Pulses- Radial 2+ DP not palpated         Assessment & Plan:      Needs flu shot at Sept visit   Recheck Lipids/A1C    Problem List Items Addressed This Visit      Unprioritized   Hyperlipidemia   Hypothyroidism    Continue synthroid      Relevant Orders   TSH   T4, free   T3, free   Lymphedema    Back to baseline, diuretics per cardiology      Peripheral neuropathy    Continue gabapentin      Spinal stenosis of lumbar region    Chronic pain, he is compliant with medications      Type II diabetes mellitus with neurological manifestations (HCC) - Primary    Recheck A1C goal A1C < 7% New meter sent to pharmacy  Continue MTF Continue statin drug for lipidemia      Relevant Orders   CBC with Differential/Platelet   Comprehensive metabolic panel   Hemoglobin A1c   HM DIABETES FOOT EXAM (Completed)    Other Visit Diagnoses    Hypomagnesemia       Relevant Orders   Magnesium      Note: This dictation was prepared with Dragon dictation along with smaller phrase technology. Any transcriptional errors that result from this process are unintentional.

## 2018-03-05 LAB — CBC WITH DIFFERENTIAL/PLATELET
Basophils Absolute: 80 {cells}/uL (ref 0–200)
Basophils Relative: 2.1 %
Eosinophils Absolute: 80 {cells}/uL (ref 15–500)
Eosinophils Relative: 2.1 %
HCT: 35.9 % — ABNORMAL LOW (ref 38.5–50.0)
Hemoglobin: 12.6 g/dL — ABNORMAL LOW (ref 13.2–17.1)
Lymphs Abs: 1307 {cells}/uL (ref 850–3900)
MCH: 30.5 pg (ref 27.0–33.0)
MCHC: 35.1 g/dL (ref 32.0–36.0)
MCV: 86.9 fL (ref 80.0–100.0)
MPV: 10.5 fL (ref 7.5–12.5)
Monocytes Relative: 9.9 %
Neutro Abs: 1957 {cells}/uL (ref 1500–7800)
Neutrophils Relative %: 51.5 %
Platelets: 174 Thousand/uL (ref 140–400)
RBC: 4.13 Million/uL — ABNORMAL LOW (ref 4.20–5.80)
RDW: 13.5 % (ref 11.0–15.0)
Total Lymphocyte: 34.4 %
WBC mixed population: 376 {cells}/uL (ref 200–950)
WBC: 3.8 Thousand/uL (ref 3.8–10.8)

## 2018-03-05 LAB — COMPREHENSIVE METABOLIC PANEL WITH GFR
AG Ratio: 2.1 (calc) (ref 1.0–2.5)
ALT: 23 U/L (ref 9–46)
AST: 26 U/L (ref 10–35)
Albumin: 4.5 g/dL (ref 3.6–5.1)
Alkaline phosphatase (APISO): 63 U/L (ref 40–115)
BUN/Creatinine Ratio: 23 (calc) — ABNORMAL HIGH (ref 6–22)
BUN: 27 mg/dL — ABNORMAL HIGH (ref 7–25)
CO2: 34 mmol/L — ABNORMAL HIGH (ref 20–32)
Calcium: 9.9 mg/dL (ref 8.6–10.3)
Chloride: 90 mmol/L — ABNORMAL LOW (ref 98–110)
Creat: 1.16 mg/dL (ref 0.70–1.18)
Globulin: 2.1 g/dL (ref 1.9–3.7)
Glucose, Bld: 91 mg/dL (ref 65–99)
Potassium: 4.6 mmol/L (ref 3.5–5.3)
Sodium: 136 mmol/L (ref 135–146)
Total Bilirubin: 0.4 mg/dL (ref 0.2–1.2)
Total Protein: 6.6 g/dL (ref 6.1–8.1)

## 2018-03-05 LAB — T3, FREE: T3, Free: 2.7 pg/mL (ref 2.3–4.2)

## 2018-03-05 LAB — HEMOGLOBIN A1C
Hgb A1c MFr Bld: 5.7 %{Hb} — ABNORMAL HIGH
Mean Plasma Glucose: 117 (calc)
eAG (mmol/L): 6.5 (calc)

## 2018-03-05 LAB — TSH: TSH: 6.11 mIU/L — ABNORMAL HIGH (ref 0.40–4.50)

## 2018-03-05 LAB — MAGNESIUM: Magnesium: 1.8 mg/dL (ref 1.5–2.5)

## 2018-03-05 LAB — T4, FREE: FREE T4: 1.4 ng/dL (ref 0.8–1.8)

## 2018-03-06 ENCOUNTER — Other Ambulatory Visit: Payer: Self-pay | Admitting: *Deleted

## 2018-03-06 DIAGNOSIS — E782 Mixed hyperlipidemia: Secondary | ICD-10-CM

## 2018-03-06 DIAGNOSIS — E039 Hypothyroidism, unspecified: Secondary | ICD-10-CM

## 2018-03-06 MED ORDER — LEVOTHYROXINE SODIUM 100 MCG PO TABS: 100.0000 ug | ORAL_TABLET | Freq: Every day | ORAL | 3 refills | Status: DC

## 2018-03-06 MED ORDER — INSULIN GLARGINE 100 UNIT/ML SOLOSTAR PEN: 5.0000 [IU] | PEN_INJECTOR | Freq: Every day | SUBCUTANEOUS | 0 refills | Status: DC

## 2018-03-20 ENCOUNTER — Encounter: Payer: Self-pay | Admitting: Family Medicine

## 2018-03-24 ENCOUNTER — Telehealth: Payer: Self-pay | Admitting: Cardiology

## 2018-03-24 NOTE — Telephone Encounter (Signed)
Called pt. He asked me to fax last office note to Dr. Nolene Bernheim at the [facility]. [phone]) DONE.

## 2018-03-24 NOTE — Telephone Encounter (Signed)
Patient left message on voicemail asking for return phone call from Dr.Branch's nurse/tg

## 2018-03-28 ENCOUNTER — Other Ambulatory Visit: Payer: Self-pay | Admitting: Family Medicine

## 2018-04-06 ENCOUNTER — Other Ambulatory Visit: Payer: Self-pay | Admitting: *Deleted

## 2018-04-06 NOTE — Telephone Encounter (Signed)
Received call from patient.   Requested refill on Oxycodone/ APAP.   Ok to refill??  Last office visit/ refill 03/04/2018.

## 2018-04-07 MED ORDER — OXYCODONE-ACETAMINOPHEN 7.5-325 MG PO TABS: ORAL_TABLET | ORAL | 0 refills | Status: DC

## 2018-04-08 ENCOUNTER — Telehealth: Payer: Self-pay | Admitting: *Deleted

## 2018-04-08 ENCOUNTER — Ambulatory Visit: Payer: Medicare Other | Admitting: Cardiology

## 2018-04-08 ENCOUNTER — Encounter: Payer: Self-pay | Admitting: Cardiology

## 2018-04-08 VITALS — BP 118/60 | HR 91 | Ht 59.0 in | Wt 192.0 lb

## 2018-04-08 DIAGNOSIS — M5136 Other intervertebral disc degeneration, lumbar region: Secondary | ICD-10-CM

## 2018-04-08 DIAGNOSIS — I890 Lymphedema, not elsewhere classified: Secondary | ICD-10-CM | POA: Diagnosis not present

## 2018-04-08 DIAGNOSIS — I5032 Chronic diastolic (congestive) heart failure: Secondary | ICD-10-CM

## 2018-04-08 DIAGNOSIS — R2681 Unsteadiness on feet: Secondary | ICD-10-CM

## 2018-04-08 DIAGNOSIS — M4807 Spinal stenosis, lumbosacral region: Secondary | ICD-10-CM

## 2018-04-08 NOTE — Telephone Encounter (Signed)
-----   Message from [name], MD sent at 04/08/2018  3:32 PM EDT -----   Priscella Mann PT referral for gait instability and DDD lumbar spine/spinal stenosis We can see if his insurance will cover   ----- Message ----- From: [name], MD Sent: 04/08/2018  11:32 AM To: [name], MD  Dr Buelah Manis,  Mutual patient of ours. He is interested in pursuing home PT if he qualifies. He is anxious to improve his mobility and walking. Is this something you would be able to help arrange and see if he qualifies for?   [name] MD

## 2018-04-08 NOTE — Progress Notes (Signed)
Clinical Summary [name] is a 72 y.o.male  seen today for follow up of the following medical problems.   1. Chronic diastolic heart failure - echo 05/2015 with normal LVEF 60-65%, grade II diastolic dysfunction - Jan 2017 echo LVEF 30-86%, grade I diastolic dysfunction - edema likely combined diastolic HF and lymphedema   - last visit increased LE edema. We increased his bumex for a few days, taking metolazone two times a week. Edema improved.  - 02/2018 labs looked good as far as Cr, K, Mg.  - home weights 189 lbs and stable.  - no recent SOB/DOE     Past Medical History:  Diagnosis Date  . Arthritis   . Congestive heart disease ([place]) 11/2015  . COPD (chronic obstructive pulmonary disease) ([place])   . DDD (degenerative disc disease), lumbosacral   . Depression   . Diabetic neuropathy ([place])   . Diastolic dysfunction   . Essential hypertension   . GERD (gastroesophageal reflux disease)   . Hyperlipidemia   . Iron deficiency anemia   . Left knee DJD   . Lumbar spinal stenosis   . Lymphedema 11/2015   BOTH LEGS  . NASH (nonalcoholic steatohepatitis)   . Peripheral edema   . Peripheral edema 10/05/2015  . Type 2 diabetes mellitus (HCC)      No Known Allergies   Current Outpatient Medications  Medication Sig Dispense Refill  . aspirin EC 81 MG tablet Take 81 mg by mouth daily.    . B-D ULTRAFINE III SHORT PEN 31G X 8 MM MISC USE AS DIRECTED DAILY WITH LANTUS. 100 each 0  . benazepril (LOTENSIN) 40 MG tablet TAKE ONE TABLET BY MOUTH DAILY. 90 tablet 0  . Blood Glucose Monitoring Suppl (ACCU-CHEK AVIVA PLUS) w/Device KIT Use as directed to monitor FSBS 2x daily. Dx: E11.9. 1 kit 1  . bumetanide (BUMEX) 2 MG tablet TAKE 1&1/2 TABLETS BY MOUTH TWICE DAILY. (Patient taking differently: TAKE 2 MG (1 TABLET) TABLETS BY MOUTH TWICE DAILY.) 135 tablet 3  . carvedilol (COREG) 3.125 MG tablet TAKE 1 TABLET BY MOUTH TWO TIMES DAILY WITH A MEAL. 60 tablet 0  . diazepam  (VALIUM) 5 MG tablet TAKE 1 TABLET BY MOUTH AT BEDTIME AS NEEDED FOR ANXIETY. 30 tablet 3  . diclofenac sodium (VOLTAREN) 1 % GEL Apply 4 g topically 4 (four) times daily as needed (pain). 100 g 3  . Ferrous Sulfate (IRON) 325 (65 FE) MG TABS Take 1 tablet by mouth daily.     Marland Kitchen gabapentin (NEURONTIN) 400 MG capsule Take 1 capsule (400 mg total) by mouth 3 (three) times daily. 90 capsule 0  . glucose blood (ACCU-CHEK AVIVA PLUS) test strip Use as directed to monitor FSBS 2x daily. Dx: E11.9. 150 each 1  . ibuprofen (ADVIL,MOTRIN) 800 MG tablet Take 800 mg by mouth 3 (three) times daily as needed. For pain    . Insulin Glargine (LANTUS SOLOSTAR) 100 UNIT/ML Solostar Pen Inject 5 Units into the skin daily at 10 pm. 15 mL 0  . levothyroxine (SYNTHROID, LEVOTHROID) 100 MCG tablet Take 1 tablet (100 mcg total) by mouth daily before breakfast. 30 tablet 3  . magnesium oxide (MAG-OX) 400 MG tablet Take 1 tablet (400 mg total) by mouth 2 (two) times daily. 60 tablet 11  . metFORMIN (GLUCOPHAGE) 1000 MG tablet TAKE ONE TABLET BY MOUTH TWICE DAILY WITH A MEAL. 180 tablet 3  . metolazone (ZAROXOLYN) 2.5 MG tablet TAKE EVERY Tuesday AND Thursday 45  tablet 3  . nystatin (MYCOSTATIN/NYSTOP) powder Apply topically 4 (four) times daily. 15 g 0  . nystatin cream (MYCOSTATIN) APPLY TO AFFECTED AREA TWICE DAILY. 30 g 0  . oxyCODONE-acetaminophen (PERCOCET) 7.5-325 MG tablet TAKE 1 TABLET BY MOUTH EVERY 6 HOURS AS NEEDED FOR SEVERE PAIN. 120 tablet 0  . pantoprazole (PROTONIX) 40 MG tablet Take 1 tablet (40 mg total) by mouth daily. 30 tablet 11  . pravastatin (PRAVACHOL) 40 MG tablet TAKE ONE TABLET BY MOUTH DAILY. (Patient taking differently: TAKE ONE TABLET BY MOUTH DAILY IN THE EVENING) 90 tablet 0  . PROLENSA 0.07 % SOLN Place 1 drop into both eyes as directed. To complete course on left 08/05/16 and to begin therapy for right eye on 07/19/16 (once daily)    . traZODone (DESYREL) 50 MG tablet Take 0.5-1 tablets  (25-50 mg total) by mouth at bedtime as needed for sleep. 30 tablet 6  . potassium chloride (K-DUR) 10 MEQ tablet Take 1 tablet (10 mEq total) by mouth 2 (two) times daily. 180 tablet 3   No current facility-administered medications for this visit.      Past Surgical History:  Procedure Laterality Date  . BIOPSY N/A 07/21/2014   Procedure: BIOPSY;  Surgeon: Daneil Dolin, MD;  Location: [facility];  Service: Endoscopy;  Laterality: N/A;  . CATARACT EXTRACTION W/PHACO Right 07/08/2016   Procedure: CATARACT EXTRACTION PHACO AND INTRAOCULAR LENS PLACEMENT RIGHT EYE;  Surgeon: Tonny Branch, MD;  Location: [facility];  Service: Ophthalmology;  Laterality: Right;  CDE: 9.01  . CATARACT EXTRACTION W/PHACO Left 07/22/2016   Procedure: CATARACT EXTRACTION PHACO AND INTRAOCULAR LENS PLACEMENT LEFT EYE CDE=10.69;  Surgeon: Tonny Branch, MD;  Location: [facility];  Service: Ophthalmology;  Laterality: Left;  left -   . COLONOSCOPY  2011   [facility]: normal colon, normal distal ileum  . COLONOSCOPY WITH PROPOFOL N/A 07/21/2014   Procedure: ATTEMPTED COLONOSCOPY WITH PROPOFOL-HAD TO STOP DUE TO BRADYCARDIA;  Surgeon: Daneil Dolin, MD;  Location: [facility];  Service: Endoscopy;  Laterality: N/A;  . EGD with enteroscopy  2011   [facility]: normal esophagus and stomach. Normal duodenum, jejunum. No evidence of AVMs.   . ESOPHAGOGASTRODUODENOSCOPY (EGD) WITH PROPOFOL N/A 07/21/2014   Procedure: ESOPHAGOGASTRODUODENOSCOPY (EGD) WITH PROPOFOL;  Surgeon: Daneil Dolin, MD;  Location: [facility];  Service: Endoscopy;  Laterality: N/A;  . HERNIA REPAIR    . KNEE ARTHROSCOPY WITH MEDIAL MENISECTOMY Left 11/06/2012   Procedure: KNEE ARTHROSCOPY WITH MEDIAL MENISECTOMY;  Surgeon: Carole Civil, MD;  Location: [facility];  Service: Orthopedics;  Laterality: Left;  . Lipoma removal     Stomach  . LUMBAR LAMINECTOMY/DECOMPRESSION MICRODISCECTOMY Left 08/30/2013   Procedure: LUMBAR LAMINECTOMY/DECOMPRESSION MICRODISCECTOMY LEFT  LUMBAR TWO  THREE;  Surgeon: Otilio Connors, MD;  Location: [facility];  Service: Neurosurgery;  Laterality: Left;  . SHOULDER SURGERY     Rght-rotator cuff  . TOOTH EXTRACTION       No Known Allergies    Family History  Problem Relation Age of Onset  . Heart disease Mother   . Hyperlipidemia Mother   . Hypertension Mother   . Depression Mother   . Diabetes Mother   . Rectal cancer Mother   . Cancer Mother   . Heart disease Father   . Hypertension Father   . Hyperlipidemia Father   . Diabetes Father   . Cancer Father   . Heart disease Sister   . Hyperlipidemia Sister   .  Hypertension Sister   . Diabetes Sister   . Diabetes Brother   . Heart disease Sister   . Hyperlipidemia Sister   . Hypertension Sister   . Heart disease Sister   . Hyperlipidemia Sister   . Hypertension Sister   . Diabetes Sister   . Diabetes Brother      Social History [name] reports that he quit smoking about 18 years ago. His smoking use included cigarettes. He started smoking about 62 years ago. He has a 110.00 pack-year smoking history. He has never used smokeless tobacco. [name] reports that he does not drink alcohol.   Review of Systems CONSTITUTIONAL: No weight loss, fever, chills, weakness or fatigue.  HEENT: Eyes: No visual loss, blurred vision, double vision or yellow sclerae.No hearing loss, sneezing, congestion, runny nose or sore throat.  SKIN: No rash or itching.  CARDIOVASCULAR: per hpi RESPIRATORY: No shortness of breath, cough or sputum.  GASTROINTESTINAL: No anorexia, nausea, vomiting or diarrhea. No abdominal pain or blood.  GENITOURINARY: No burning on urination, no polyuria NEUROLOGICAL: No headache, dizziness, syncope, paralysis, ataxia, numbness or tingling in the extremities. No change in bowel or bladder control.  MUSCULOSKELETAL: No muscle, back pain, joint pain or stiffness.  LYMPHATICS: No enlarged nodes. No history of splenectomy.  PSYCHIATRIC: No history of  depression or anxiety.  ENDOCRINOLOGIC: No reports of sweating, cold or heat intolerance. No polyuria or polydipsia.  Marland Kitchen   Physical Examination There were no vitals filed for this visit. Filed Weights   04/08/18 0915  Weight: 192 lb (87.1 kg)    Gen: resting comfortably, no acute distress HEENT: no scleral icterus, pupils equal round and reactive, no palptable cervical adenopathy,  CV: RRR, no m/r/g, no jvd Resp: Clear to auscultation bilaterally GI: abdomen is soft, non-tender, non-distended, normal bowel sounds, no hepatosplenomegaly MSK: extremities are warm, no edema.  Skin: warm, no rash Neuro:  no focal deficits Psych: appropriate affect      Assessment and Plan  1. LE edema/Acute onChronic diastolic HF - likely combination of lymphedema and acute on chronic diastolic HF -doing well, weight stable. Labs stable - continue current diuretics.     Will see if Dr Buelah Manis can help him with his home PT request   Michael [name], M.D.

## 2018-04-08 NOTE — Patient Instructions (Signed)
Medication Instructions:   Your physician recommends that you continue on your current medications as directed. Please refer to the Current Medication list given to you today.  Labwork:  none  Testing/Procedures:  none  Follow-Up:  Your physician recommends that you schedule a follow-up appointment in: 4 months.   Any Other Special Instructions Will Be Listed Below (If Applicable).  If you need a refill on your cardiac medications before your next appointment, please call your pharmacy. 

## 2018-04-13 ENCOUNTER — Telehealth: Payer: Self-pay | Admitting: *Deleted

## 2018-04-13 DIAGNOSIS — E1149 Type 2 diabetes mellitus with other diabetic neurological complication: Secondary | ICD-10-CM | POA: Diagnosis not present

## 2018-04-13 DIAGNOSIS — Z7982 Long term (current) use of aspirin: Secondary | ICD-10-CM | POA: Diagnosis not present

## 2018-04-13 DIAGNOSIS — G622 Polyneuropathy due to other toxic agents: Secondary | ICD-10-CM | POA: Diagnosis not present

## 2018-04-13 DIAGNOSIS — E782 Mixed hyperlipidemia: Secondary | ICD-10-CM | POA: Diagnosis not present

## 2018-04-13 DIAGNOSIS — I890 Lymphedema, not elsewhere classified: Secondary | ICD-10-CM | POA: Diagnosis not present

## 2018-04-13 DIAGNOSIS — E039 Hypothyroidism, unspecified: Secondary | ICD-10-CM | POA: Diagnosis not present

## 2018-04-13 DIAGNOSIS — Z79891 Long term (current) use of opiate analgesic: Secondary | ICD-10-CM | POA: Diagnosis not present

## 2018-04-13 DIAGNOSIS — M4807 Spinal stenosis, lumbosacral region: Secondary | ICD-10-CM | POA: Diagnosis not present

## 2018-04-13 DIAGNOSIS — M5136 Other intervertebral disc degeneration, lumbar region: Secondary | ICD-10-CM | POA: Diagnosis not present

## 2018-04-13 DIAGNOSIS — Z794 Long term (current) use of insulin: Secondary | ICD-10-CM | POA: Diagnosis not present

## 2018-04-13 NOTE — Telephone Encounter (Signed)
Agree with above 

## 2018-04-13 NOTE — Telephone Encounter (Signed)
Received call from Michael [name], [facility] PT with [facility] [phone] telephone.   Requested VO to extend PT 3x weekly x2 weeks, then 2x weekly x2 weeks for BLE strengthening, transfers and ambulation.  VO given.

## 2018-04-16 DIAGNOSIS — Z7982 Long term (current) use of aspirin: Secondary | ICD-10-CM | POA: Diagnosis not present

## 2018-04-16 DIAGNOSIS — E039 Hypothyroidism, unspecified: Secondary | ICD-10-CM | POA: Diagnosis not present

## 2018-04-16 DIAGNOSIS — Z79891 Long term (current) use of opiate analgesic: Secondary | ICD-10-CM | POA: Diagnosis not present

## 2018-04-16 DIAGNOSIS — I890 Lymphedema, not elsewhere classified: Secondary | ICD-10-CM | POA: Diagnosis not present

## 2018-04-16 DIAGNOSIS — Z794 Long term (current) use of insulin: Secondary | ICD-10-CM | POA: Diagnosis not present

## 2018-04-16 DIAGNOSIS — G622 Polyneuropathy due to other toxic agents: Secondary | ICD-10-CM | POA: Diagnosis not present

## 2018-04-16 DIAGNOSIS — M4807 Spinal stenosis, lumbosacral region: Secondary | ICD-10-CM | POA: Diagnosis not present

## 2018-04-16 DIAGNOSIS — E782 Mixed hyperlipidemia: Secondary | ICD-10-CM | POA: Diagnosis not present

## 2018-04-16 DIAGNOSIS — M5136 Other intervertebral disc degeneration, lumbar region: Secondary | ICD-10-CM | POA: Diagnosis not present

## 2018-04-16 DIAGNOSIS — E1149 Type 2 diabetes mellitus with other diabetic neurological complication: Secondary | ICD-10-CM | POA: Diagnosis not present

## 2018-04-17 DIAGNOSIS — M4807 Spinal stenosis, lumbosacral region: Secondary | ICD-10-CM | POA: Diagnosis not present

## 2018-04-17 DIAGNOSIS — Z7982 Long term (current) use of aspirin: Secondary | ICD-10-CM | POA: Diagnosis not present

## 2018-04-17 DIAGNOSIS — I890 Lymphedema, not elsewhere classified: Secondary | ICD-10-CM | POA: Diagnosis not present

## 2018-04-17 DIAGNOSIS — M5136 Other intervertebral disc degeneration, lumbar region: Secondary | ICD-10-CM | POA: Diagnosis not present

## 2018-04-17 DIAGNOSIS — Z794 Long term (current) use of insulin: Secondary | ICD-10-CM | POA: Diagnosis not present

## 2018-04-17 DIAGNOSIS — G622 Polyneuropathy due to other toxic agents: Secondary | ICD-10-CM | POA: Diagnosis not present

## 2018-04-17 DIAGNOSIS — Z79891 Long term (current) use of opiate analgesic: Secondary | ICD-10-CM | POA: Diagnosis not present

## 2018-04-17 DIAGNOSIS — E1149 Type 2 diabetes mellitus with other diabetic neurological complication: Secondary | ICD-10-CM | POA: Diagnosis not present

## 2018-04-17 DIAGNOSIS — E039 Hypothyroidism, unspecified: Secondary | ICD-10-CM | POA: Diagnosis not present

## 2018-04-17 DIAGNOSIS — E782 Mixed hyperlipidemia: Secondary | ICD-10-CM | POA: Diagnosis not present

## 2018-04-20 DIAGNOSIS — I890 Lymphedema, not elsewhere classified: Secondary | ICD-10-CM | POA: Diagnosis not present

## 2018-04-20 DIAGNOSIS — E1149 Type 2 diabetes mellitus with other diabetic neurological complication: Secondary | ICD-10-CM | POA: Diagnosis not present

## 2018-04-20 DIAGNOSIS — Z794 Long term (current) use of insulin: Secondary | ICD-10-CM | POA: Diagnosis not present

## 2018-04-20 DIAGNOSIS — Z79891 Long term (current) use of opiate analgesic: Secondary | ICD-10-CM | POA: Diagnosis not present

## 2018-04-20 DIAGNOSIS — M4807 Spinal stenosis, lumbosacral region: Secondary | ICD-10-CM | POA: Diagnosis not present

## 2018-04-20 DIAGNOSIS — Z7982 Long term (current) use of aspirin: Secondary | ICD-10-CM | POA: Diagnosis not present

## 2018-04-20 DIAGNOSIS — M5136 Other intervertebral disc degeneration, lumbar region: Secondary | ICD-10-CM | POA: Diagnosis not present

## 2018-04-20 DIAGNOSIS — G622 Polyneuropathy due to other toxic agents: Secondary | ICD-10-CM | POA: Diagnosis not present

## 2018-04-20 DIAGNOSIS — E782 Mixed hyperlipidemia: Secondary | ICD-10-CM | POA: Diagnosis not present

## 2018-04-20 DIAGNOSIS — E039 Hypothyroidism, unspecified: Secondary | ICD-10-CM | POA: Diagnosis not present

## 2018-04-21 DIAGNOSIS — I890 Lymphedema, not elsewhere classified: Secondary | ICD-10-CM | POA: Diagnosis not present

## 2018-04-21 DIAGNOSIS — E782 Mixed hyperlipidemia: Secondary | ICD-10-CM | POA: Diagnosis not present

## 2018-04-21 DIAGNOSIS — M4807 Spinal stenosis, lumbosacral region: Secondary | ICD-10-CM | POA: Diagnosis not present

## 2018-04-21 DIAGNOSIS — E039 Hypothyroidism, unspecified: Secondary | ICD-10-CM | POA: Diagnosis not present

## 2018-04-21 DIAGNOSIS — E1149 Type 2 diabetes mellitus with other diabetic neurological complication: Secondary | ICD-10-CM | POA: Diagnosis not present

## 2018-04-21 DIAGNOSIS — M5136 Other intervertebral disc degeneration, lumbar region: Secondary | ICD-10-CM | POA: Diagnosis not present

## 2018-04-21 DIAGNOSIS — G622 Polyneuropathy due to other toxic agents: Secondary | ICD-10-CM | POA: Diagnosis not present

## 2018-04-21 DIAGNOSIS — Z7982 Long term (current) use of aspirin: Secondary | ICD-10-CM | POA: Diagnosis not present

## 2018-04-21 DIAGNOSIS — Z794 Long term (current) use of insulin: Secondary | ICD-10-CM | POA: Diagnosis not present

## 2018-04-21 DIAGNOSIS — Z79891 Long term (current) use of opiate analgesic: Secondary | ICD-10-CM | POA: Diagnosis not present

## 2018-04-24 DIAGNOSIS — Z79891 Long term (current) use of opiate analgesic: Secondary | ICD-10-CM | POA: Diagnosis not present

## 2018-04-24 DIAGNOSIS — I890 Lymphedema, not elsewhere classified: Secondary | ICD-10-CM | POA: Diagnosis not present

## 2018-04-24 DIAGNOSIS — E039 Hypothyroidism, unspecified: Secondary | ICD-10-CM | POA: Diagnosis not present

## 2018-04-24 DIAGNOSIS — E782 Mixed hyperlipidemia: Secondary | ICD-10-CM | POA: Diagnosis not present

## 2018-04-24 DIAGNOSIS — M4807 Spinal stenosis, lumbosacral region: Secondary | ICD-10-CM | POA: Diagnosis not present

## 2018-04-24 DIAGNOSIS — G622 Polyneuropathy due to other toxic agents: Secondary | ICD-10-CM | POA: Diagnosis not present

## 2018-04-24 DIAGNOSIS — M5136 Other intervertebral disc degeneration, lumbar region: Secondary | ICD-10-CM | POA: Diagnosis not present

## 2018-04-24 DIAGNOSIS — Z7982 Long term (current) use of aspirin: Secondary | ICD-10-CM | POA: Diagnosis not present

## 2018-04-24 DIAGNOSIS — Z794 Long term (current) use of insulin: Secondary | ICD-10-CM | POA: Diagnosis not present

## 2018-04-24 DIAGNOSIS — E1149 Type 2 diabetes mellitus with other diabetic neurological complication: Secondary | ICD-10-CM | POA: Diagnosis not present

## 2018-04-26 ENCOUNTER — Other Ambulatory Visit: Payer: Self-pay | Admitting: Family Medicine

## 2018-04-27 DIAGNOSIS — E782 Mixed hyperlipidemia: Secondary | ICD-10-CM | POA: Diagnosis not present

## 2018-04-27 DIAGNOSIS — Z79891 Long term (current) use of opiate analgesic: Secondary | ICD-10-CM | POA: Diagnosis not present

## 2018-04-27 DIAGNOSIS — Z7982 Long term (current) use of aspirin: Secondary | ICD-10-CM | POA: Diagnosis not present

## 2018-04-27 DIAGNOSIS — I890 Lymphedema, not elsewhere classified: Secondary | ICD-10-CM | POA: Diagnosis not present

## 2018-04-27 DIAGNOSIS — Z794 Long term (current) use of insulin: Secondary | ICD-10-CM | POA: Diagnosis not present

## 2018-04-27 DIAGNOSIS — G622 Polyneuropathy due to other toxic agents: Secondary | ICD-10-CM | POA: Diagnosis not present

## 2018-04-27 DIAGNOSIS — E039 Hypothyroidism, unspecified: Secondary | ICD-10-CM | POA: Diagnosis not present

## 2018-04-27 DIAGNOSIS — M4807 Spinal stenosis, lumbosacral region: Secondary | ICD-10-CM | POA: Diagnosis not present

## 2018-04-27 DIAGNOSIS — M5136 Other intervertebral disc degeneration, lumbar region: Secondary | ICD-10-CM | POA: Diagnosis not present

## 2018-04-27 DIAGNOSIS — E1149 Type 2 diabetes mellitus with other diabetic neurological complication: Secondary | ICD-10-CM | POA: Diagnosis not present

## 2018-04-29 ENCOUNTER — Telehealth: Payer: Self-pay | Admitting: Family Medicine

## 2018-04-29 ENCOUNTER — Ambulatory Visit (INDEPENDENT_AMBULATORY_CARE_PROVIDER_SITE_OTHER): Payer: Medicare Other | Admitting: Family Medicine

## 2018-04-29 ENCOUNTER — Encounter: Payer: Self-pay | Admitting: Family Medicine

## 2018-04-29 VITALS — BP 150/70 | HR 82 | Temp 98.3°F | Resp 18 | Wt 186.0 lb

## 2018-04-29 DIAGNOSIS — L309 Dermatitis, unspecified: Secondary | ICD-10-CM | POA: Diagnosis not present

## 2018-04-29 DIAGNOSIS — I890 Lymphedema, not elsewhere classified: Secondary | ICD-10-CM

## 2018-04-29 DIAGNOSIS — R609 Edema, unspecified: Secondary | ICD-10-CM

## 2018-04-29 LAB — CBC WITH DIFFERENTIAL/PLATELET
BASOS PCT: 1.5 %
Basophils Absolute: 59 cells/uL (ref 0–200)
EOS ABS: 59 {cells}/uL (ref 15–500)
Eosinophils Relative: 1.5 %
HEMATOCRIT: 36.8 % — AB (ref 38.5–50.0)
Hemoglobin: 12.6 g/dL — ABNORMAL LOW (ref 13.2–17.1)
LYMPHS ABS: 1408 {cells}/uL (ref 850–3900)
MCH: 30.1 pg (ref 27.0–33.0)
MCHC: 34.2 g/dL (ref 32.0–36.0)
MCV: 88 fL (ref 80.0–100.0)
MPV: 9.8 fL (ref 7.5–12.5)
Monocytes Relative: 7.7 %
Neutro Abs: 2075 cells/uL (ref 1500–7800)
Neutrophils Relative %: 53.2 %
PLATELETS: 234 10*3/uL (ref 140–400)
RBC: 4.18 10*6/uL — ABNORMAL LOW (ref 4.20–5.80)
RDW: 14.2 % (ref 11.0–15.0)
TOTAL LYMPHOCYTE: 36.1 %
WBC: 3.9 10*3/uL (ref 3.8–10.8)
WBCMIX: 300 {cells}/uL (ref 200–950)

## 2018-04-29 LAB — BASIC METABOLIC PANEL WITH GFR
BUN: 15 mg/dL (ref 7–25)
CHLORIDE: 93 mmol/L — AB (ref 98–110)
CO2: 30 mmol/L (ref 20–32)
CREATININE: 0.97 mg/dL (ref 0.70–1.18)
Calcium: 9.6 mg/dL (ref 8.6–10.3)
GFR, EST AFRICAN AMERICAN: 91 mL/min/{1.73_m2} (ref >=60)
GFR, EST NON AFRICAN AMERICAN: 79 mL/min/{1.73_m2} (ref >=60)
Glucose, Bld: 115 mg/dL — ABNORMAL HIGH (ref 65–99)
Potassium: 3.9 mmol/L (ref 3.5–5.3)
Sodium: 136 mmol/L (ref 135–146)

## 2018-04-29 MED ORDER — OXYCODONE-ACETAMINOPHEN 10-325 MG PO TABS: 1.0000 | ORAL_TABLET | Freq: Three times a day (TID) | ORAL | 0 refills | Status: DC | PRN

## 2018-04-29 MED ORDER — OXYCODONE-ACETAMINOPHEN 5-325 MG PO TABS: 1.0000 | ORAL_TABLET | Freq: Four times a day (QID) | ORAL | 0 refills | Status: DC | PRN

## 2018-04-29 NOTE — Telephone Encounter (Signed)
Will send to PCP

## 2018-04-29 NOTE — Telephone Encounter (Signed)
Call placed to pharmacy. New prescription will be filled through insurance.   Call placed to patient and patient made aware.

## 2018-04-29 NOTE — Progress Notes (Signed)
Patient ID: Michael [name], male    DOB: 1946-02-04, 72 y.o.   MRN: [id number]  PCP: [name], MD  Chief Complaint  Patient presents with  . Legs swollen    Weight at home 7/29 196lbs 7/30 192 lbs 7/31 186 lbs    Subjective:   Michael [name] is a 72 y.o. male, presents to clinic with CC of bilateral leg edema with worsening pain and with blisters leaking clear fluid.   He does have chronic pain and hx of the lymphedema, is on bumex BID and uses another "powerful diuretic" 2 times a week.  His leg swelling is reportedly worse than his baseline.  He reports urinating frequently from meds, and his weight is decreasing, but fluid is not coming off his legs and his pain is worsening, currently severe and he is out of his narcotic pain medicine, has been taking an extra dose beyond QID PRN dosing when he has severe pain at night that is not letting him sleep. He has compression stockings, cannot get them on his legs.  He is wearing two pairs of hospital non-slip socks.  He has compression machine that he is supposed to wear 1 hour at a time, has not been doing this consistently because of pain.     Patient Active Problem List   Diagnosis Date Noted  . Depression, major, single episode, mild ([place]) 06/20/2017  . DNR (do not resuscitate) 06/20/2017  . Chronic diastolic CHF (congestive heart failure) ([place]) 10/23/2016  . Generalized weakness 08/01/2016  . Intertriginous candidiasis 12/23/2014  . Hypothyroidism 11/30/2014  . Sinus pause 07/21/2014  . Liver fibrosis 06/29/2014  . IDA (iron deficiency anemia) 06/29/2014  . Iron deficiency anemia 05/31/2014  . Hepatomegaly 05/25/2014  . Difficulty walking 05/03/2014  . Knee pain, chronic 05/03/2014  . Spinal stenosis of lumbar region 07/06/2013  . Carpal tunnel syndrome 04/13/2013  . Lymphedema 04/13/2013  . DDD (degenerative disc disease), lumbosacral 04/13/2013  . Peripheral neuropathy 01/05/2013  . S/P arthroscopy of left  knee 11/09/2012  . Meniscus tear 10/07/2012  . NASH (nonalcoholic steatohepatitis) 05/22/2007  . Type II diabetes mellitus with neurological manifestations ([place]) 05/20/2007  . Hyperlipidemia 10/15/2006  . OBESITY, MORBID 10/15/2006  . Essential hypertension 10/15/2006  . GERD 10/15/2006     Prior to Admission medications   Medication Sig Start Date End Date Taking? Authorizing Provider  aspirin EC 81 MG tablet Take 81 mg by mouth daily.   Yes [provider]  B-D ULTRAFINE III SHORT PEN 31G X 8 MM MISC USE AS DIRECTED DAILY WITH LANTUS. 05/17/16  Yes [place], Modena Nunnery, MD  benazepril (LOTENSIN) 40 MG tablet TAKE ONE TABLET BY MOUTH DAILY. 05/27/16  Yes [place], Modena Nunnery, MD  Blood Glucose Monitoring Suppl (ACCU-CHEK AVIVA PLUS) w/Device KIT Use as directed to monitor FSBS 2x daily. Dx: E11.9. 03/04/18  Yes [place], Modena Nunnery, MD  bumetanide (BUMEX) 2 MG tablet TAKE 1&1/2 TABLETS BY MOUTH TWICE DAILY. Patient taking differently: TAKE 2 MG (1 TABLET) TABLETS BY MOUTH TWICE DAILY. 06/17/16  Yes [place], Alphonse Guild, MD  carvedilol (COREG) 3.125 MG tablet TAKE 1 TABLET BY MOUTH TWO TIMES DAILY WITH A MEAL. 04/27/18  Yes [place], Modena Nunnery, MD  diazepam (VALIUM) 5 MG tablet TAKE 1 TABLET BY MOUTH AT BEDTIME AS NEEDED FOR ANXIETY. 03/04/18  Yes [place], Modena Nunnery, MD  diclofenac sodium (VOLTAREN) 1 % GEL Apply 4 g topically 4 (four) times daily as needed (pain). 03/06/17  Yes [place], Modena Nunnery, MD  Ferrous Sulfate (IRON) 325 (65 FE) MG TABS Take 1 tablet by mouth daily.    Yes [provider]  gabapentin (NEURONTIN) 400 MG capsule Take 1 capsule (400 mg total) by mouth 3 (three) times daily. 03/04/18  Yes [place], Modena Nunnery, MD  glucose blood (ACCU-CHEK AVIVA PLUS) test strip Use as directed to monitor FSBS 2x daily. Dx: E11.9. 11/21/16  Yes [place], Modena Nunnery, MD  ibuprofen (ADVIL,MOTRIN) 800 MG tablet Take 800 mg by mouth 3 (three) times daily as needed. For pain 07/12/16  Yes [provider]  Insulin Glargine (LANTUS SOLOSTAR) 100 UNIT/ML Solostar Pen Inject 5 Units into the skin daily at 10 pm. 03/06/18  Yes [place], Modena Nunnery, MD  levothyroxine (SYNTHROID, LEVOTHROID) 100 MCG tablet Take 1 tablet (100 mcg total) by mouth daily before breakfast. 03/06/18  Yes [place], Modena Nunnery, MD  magnesium oxide (MAG-OX) 400 MG tablet Take 1 tablet (400 mg total) by mouth 2 (two) times daily. 11/19/17  Yes Imogene Burn, PA-C  metFORMIN (GLUCOPHAGE) 1000 MG tablet TAKE ONE TABLET BY MOUTH TWICE DAILY WITH A MEAL. 11/07/15  Yes [name], MD  metolazone (ZAROXOLYN) 2.5 MG tablet TAKE EVERY Tuesday AND Thursday Patient taking differently: TAKE EVERY Monday AND Friday 11/04/17  Yes [name], MD  nystatin (MYCOSTATIN/NYSTOP) powder Apply topically 4 (four) times daily. 02/28/17  Yes [place], Modena Nunnery, MD  nystatin cream (MYCOSTATIN) APPLY TO AFFECTED AREA TWICE DAILY. 03/04/18  Yes [place], Modena Nunnery, MD  oxyCODONE-acetaminophen (PERCOCET) 7.5-325 MG tablet TAKE 1 TABLET BY MOUTH EVERY 6 HOURS AS NEEDED FOR SEVERE PAIN. 04/07/18  Yes [place], Modena Nunnery, MD  pantoprazole (PROTONIX) 40 MG tablet Take 1 tablet (40 mg total) by mouth daily. 11/04/17  Yes [name], MD  pravastatin (PRAVACHOL) 40 MG tablet TAKE ONE TABLET BY MOUTH DAILY. Patient taking differently: TAKE ONE TABLET BY MOUTH DAILY IN THE EVENING 05/21/16  Yes [place], Modena Nunnery, MD  PROLENSA 0.07 % SOLN Place 1 drop into both eyes as directed. To complete course on left 08/05/16 and to begin therapy for right eye on 07/19/16 (once daily) 07/01/16  Yes [provider]  traZODone (DESYREL) 50 MG tablet Take 0.5-1 tablets (25-50 mg total) by mouth at bedtime as needed for sleep. 06/20/17  Yes [place], Modena Nunnery, MD  potassium chloride (K-DUR) 10 MEQ tablet Take 1 tablet (10 mEq total) by mouth 2 (two) times daily. 11/19/17 02/17/18  Imogene Burn, PA-C     No Known Allergies   Family History  Problem Relation Age  of Onset  . Heart disease Mother   . Hyperlipidemia Mother   . Hypertension Mother   . Depression Mother   . Diabetes Mother   . Rectal cancer Mother   . Cancer Mother   . Heart disease Father   . Hypertension Father   . Hyperlipidemia Father   . Diabetes Father   . Cancer Father   . Heart disease Sister   . Hyperlipidemia Sister   . Hypertension Sister   . Diabetes Sister   . Diabetes Brother   . Heart disease Sister   . Hyperlipidemia Sister   . Hypertension Sister   . Heart disease Sister   . Hyperlipidemia Sister   . Hypertension Sister   . Diabetes Sister   . Diabetes Brother      Social History   Socioeconomic History  . Marital status: Widowed    Spouse name:  Not on file  . Number of children: Not on file  . Years of education: 23  . Highest education level: Not on file  Occupational History  . Not on file  Social Needs  . Financial resource strain: Not on file  . Food insecurity:    Worry: Not on file    Inability: Not on file  . Transportation needs:    Medical: Not on file    Non-medical: Not on file  Tobacco Use  . Smoking status: Former Smoker    Packs/day: 2.50    Years: 44.00    Pack years: 110.00    Types: Cigarettes    Start date: 05/07/1955    Last attempt to quit: 10/01/1999    Years since quitting: 18.5  . Smokeless tobacco: Never Used  Substance and Sexual Activity  . Alcohol use: No    Alcohol/week: 0.0 oz  . Drug use: No  . Sexual activity: Yes    Birth control/protection: None  Lifestyle  . Physical activity:    Days per week: Not on file    Minutes per session: Not on file  . Stress: Not on file  Relationships  . Social connections:    Talks on phone: Not on file    Gets together: Not on file    Attends religious service: Not on file    Active member of club or organization: Not on file    Attends meetings of clubs or organizations: Not on file    Relationship status: Not on file  . Intimate partner violence:    Fear of  current or ex partner: Not on file    Emotionally abused: Not on file    Physically abused: Not on file    Forced sexual activity: Not on file  Other Topics Concern  . Not on file  Social History Narrative  . Not on file     Review of Systems  Constitutional: Negative for activity change, appetite change, chills, diaphoresis, fatigue and fever.  HENT: Negative.   Eyes: Negative.   Respiratory: Negative for chest tightness and wheezing.   Cardiovascular: Negative.  Negative for chest pain and palpitations.  Endocrine: Negative.   Genitourinary: Positive for frequency. Negative for decreased urine volume, difficulty urinating, hematuria and urgency.  Skin: Positive for color change and wound.  Allergic/Immunologic: Negative.   Neurological: Negative.   All other systems reviewed and are negative.      Objective:    Vitals:   04/29/18 0908  BP: (!) 150/70  Pulse: 82  Resp: 18  Temp: 98.3 F (36.8 C)  TempSrc: Oral  SpO2: 98%  Weight: 186 lb (84.4 kg)      Physical Exam  Constitutional: He appears well-developed. No distress.  Chronically ill-appearing male in wheelchair, alert, NAD  HENT:  Head: Normocephalic and atraumatic.  Nose: Nose normal.  Mucous membranes mildly dry with matted tongue  Eyes: Conjunctivae are normal. Right eye exhibits no discharge. Left eye exhibits no discharge.  Neck: No tracheal deviation present.  Cardiovascular: Normal rate and regular rhythm. Exam reveals no gallop and no friction rub.  No murmur heard. Pulmonary/Chest: Effort normal. No stridor. No respiratory distress. He has no wheezes. He has no rales. He exhibits no tenderness.  Abdominal: Soft. Bowel sounds are normal. There is no tenderness.  Musculoskeletal: Normal range of motion.  Neurological: He is alert. He exhibits normal muscle tone. Coordination normal.  Skin: Skin is warm and dry. No rash noted. He is not diaphoretic.  There is erythema.  Bilateral lower extremity  edema severe and pitting from the shins down, left leg slightly larger than right, chronic, skin is tight, shiny erythematous with fluid-filled blisters scattered pretibially and around ankles, tender to palpation.  Leaking clear fluid  Psychiatric: He has a normal mood and affect. His behavior is normal.  Nursing note and vitals reviewed.         Assessment & Plan:      ICD-10-CM   1. Lymphedema I89.0 CBC with Differential/Platelet    BASIC METABOLIC PANEL WITH GFR    Apply unna boot    Ambulatory referral to [facility]: oxyCODONE-acetaminophen (PERCOCET/ROXICET) 5-325 MG tablet  2. Dermatitis L30.9 CBC with Differential/Platelet    BASIC METABOLIC PANEL WITH GFR    Apply unna boot    Ambulatory referral to [facility]: oxyCODONE-acetaminophen (PERCOCET/ROXICET) 5-325 MG tablet     Home health for lymphedema/dermatitis and management of Unna boots, Unna boots applied in office today.  Request narcotic pain meds, #120 of percocet 7.5-325, given 04/07/18, out this am at 1 am.    I did attempt to send a higher dosing through 4 short-term use because of exacerbation of pain with an acute on chronic issue.  Pharmacy refused to fill it but later was contacted and did prescribe milligrams Percocets short-term use, will follow-up with PCP and resume chronic pain medicines.    Will recheck electrolytes and kidney function, check CBC, he has dermatitis secondary to lymphedema tx with unna boots, con't diuretics as previously prescribed.     Delsa Grana, PA-C 04/29/18 9:53 AM

## 2018-04-29 NOTE — Telephone Encounter (Signed)
Patient called in stating that [facility] would not fill his prescription for Percocet 5/325 stating that it could not be filled until 05/07/18 because he had a prescription filled on 04/07/18 for #120 percocet 7.5/325. Please advise?

## 2018-04-29 NOTE — Telephone Encounter (Signed)
Pt did take too many percocet, has infection on legs, out of narcotic also concern with his high dose, he will go into withdrawel

## 2018-04-30 NOTE — Progress Notes (Signed)
Labs look good, follow up with PCP

## 2018-05-01 ENCOUNTER — Encounter: Payer: Self-pay | Admitting: *Deleted

## 2018-05-01 DIAGNOSIS — Z794 Long term (current) use of insulin: Secondary | ICD-10-CM | POA: Diagnosis not present

## 2018-05-01 DIAGNOSIS — E782 Mixed hyperlipidemia: Secondary | ICD-10-CM | POA: Diagnosis not present

## 2018-05-01 DIAGNOSIS — E1149 Type 2 diabetes mellitus with other diabetic neurological complication: Secondary | ICD-10-CM | POA: Diagnosis not present

## 2018-05-01 DIAGNOSIS — G622 Polyneuropathy due to other toxic agents: Secondary | ICD-10-CM | POA: Diagnosis not present

## 2018-05-01 DIAGNOSIS — I890 Lymphedema, not elsewhere classified: Secondary | ICD-10-CM | POA: Diagnosis not present

## 2018-05-01 DIAGNOSIS — M5136 Other intervertebral disc degeneration, lumbar region: Secondary | ICD-10-CM | POA: Diagnosis not present

## 2018-05-01 DIAGNOSIS — Z79891 Long term (current) use of opiate analgesic: Secondary | ICD-10-CM | POA: Diagnosis not present

## 2018-05-01 DIAGNOSIS — E039 Hypothyroidism, unspecified: Secondary | ICD-10-CM | POA: Diagnosis not present

## 2018-05-01 DIAGNOSIS — Z7982 Long term (current) use of aspirin: Secondary | ICD-10-CM | POA: Diagnosis not present

## 2018-05-01 DIAGNOSIS — M4807 Spinal stenosis, lumbosacral region: Secondary | ICD-10-CM | POA: Diagnosis not present

## 2018-05-04 ENCOUNTER — Telehealth: Payer: Self-pay | Admitting: *Deleted

## 2018-05-04 DIAGNOSIS — E1149 Type 2 diabetes mellitus with other diabetic neurological complication: Secondary | ICD-10-CM | POA: Diagnosis not present

## 2018-05-04 DIAGNOSIS — Z79891 Long term (current) use of opiate analgesic: Secondary | ICD-10-CM | POA: Diagnosis not present

## 2018-05-04 DIAGNOSIS — M5136 Other intervertebral disc degeneration, lumbar region: Secondary | ICD-10-CM | POA: Diagnosis not present

## 2018-05-04 DIAGNOSIS — E782 Mixed hyperlipidemia: Secondary | ICD-10-CM | POA: Diagnosis not present

## 2018-05-04 DIAGNOSIS — Z7982 Long term (current) use of aspirin: Secondary | ICD-10-CM | POA: Diagnosis not present

## 2018-05-04 DIAGNOSIS — E039 Hypothyroidism, unspecified: Secondary | ICD-10-CM | POA: Diagnosis not present

## 2018-05-04 DIAGNOSIS — Z794 Long term (current) use of insulin: Secondary | ICD-10-CM | POA: Diagnosis not present

## 2018-05-04 DIAGNOSIS — I890 Lymphedema, not elsewhere classified: Secondary | ICD-10-CM | POA: Diagnosis not present

## 2018-05-04 DIAGNOSIS — G622 Polyneuropathy due to other toxic agents: Secondary | ICD-10-CM | POA: Diagnosis not present

## 2018-05-04 DIAGNOSIS — M4807 Spinal stenosis, lumbosacral region: Secondary | ICD-10-CM | POA: Diagnosis not present

## 2018-05-04 NOTE — Telephone Encounter (Signed)
Agree with above 

## 2018-05-04 NOTE — Telephone Encounter (Signed)
okay

## 2018-05-04 NOTE — Telephone Encounter (Signed)
Received call from Verdis Frederickson [facility] SN with [place] [phone] telephone.   Reports that she went out to see patient and noted that no areas are open or draining for UNNA boots. Reports that patient is complaining about [employer] and requested alternative therapy. States that [employer] were removed, and replaced with ACE wrap to BLE.

## 2018-05-04 NOTE — Telephone Encounter (Signed)
Received call from Amy, [facility] PT with [place] [phone] telephone.   Requested VO to extend [facility] PT x3 weeks for BLE edema.   VO given.

## 2018-05-06 ENCOUNTER — Encounter: Payer: Self-pay | Admitting: Family Medicine

## 2018-05-06 ENCOUNTER — Ambulatory Visit (INDEPENDENT_AMBULATORY_CARE_PROVIDER_SITE_OTHER): Payer: Medicare Other | Admitting: Family Medicine

## 2018-05-06 ENCOUNTER — Other Ambulatory Visit: Payer: Self-pay

## 2018-05-06 VITALS — BP 140/68 | HR 78 | Temp 98.8°F | Resp 16

## 2018-05-06 DIAGNOSIS — G894 Chronic pain syndrome: Secondary | ICD-10-CM | POA: Diagnosis not present

## 2018-05-06 DIAGNOSIS — M4807 Spinal stenosis, lumbosacral region: Secondary | ICD-10-CM | POA: Diagnosis not present

## 2018-05-06 DIAGNOSIS — Z794 Long term (current) use of insulin: Secondary | ICD-10-CM | POA: Diagnosis not present

## 2018-05-06 DIAGNOSIS — Z7982 Long term (current) use of aspirin: Secondary | ICD-10-CM | POA: Diagnosis not present

## 2018-05-06 DIAGNOSIS — E039 Hypothyroidism, unspecified: Secondary | ICD-10-CM | POA: Diagnosis not present

## 2018-05-06 DIAGNOSIS — I890 Lymphedema, not elsewhere classified: Secondary | ICD-10-CM

## 2018-05-06 DIAGNOSIS — E1149 Type 2 diabetes mellitus with other diabetic neurological complication: Secondary | ICD-10-CM | POA: Diagnosis not present

## 2018-05-06 DIAGNOSIS — M5136 Other intervertebral disc degeneration, lumbar region: Secondary | ICD-10-CM | POA: Diagnosis not present

## 2018-05-06 DIAGNOSIS — E782 Mixed hyperlipidemia: Secondary | ICD-10-CM | POA: Diagnosis not present

## 2018-05-06 DIAGNOSIS — Z79891 Long term (current) use of opiate analgesic: Secondary | ICD-10-CM | POA: Diagnosis not present

## 2018-05-06 DIAGNOSIS — G622 Polyneuropathy due to other toxic agents: Secondary | ICD-10-CM | POA: Diagnosis not present

## 2018-05-06 MED ORDER — OXYCODONE-ACETAMINOPHEN 7.5-325 MG PO TABS: 1.0000 | ORAL_TABLET | Freq: Four times a day (QID) | ORAL | 0 refills | Status: DC | PRN

## 2018-05-06 NOTE — Progress Notes (Signed)
   Subjective:    Patient ID: Michael [name], male    DOB: Jun 27, 1946, 72 y.o.   MRN: [id number]  Patient presents for Follow-up (leg pain) and Medication Management (refill Oxycodone/APAP)   Pt here to f/u lymphedema   Has ACE wraps for legs for swelling , did not tolerate unna boot and no open wournds so this was discontinued and [place] removed.  He is taking is regular diuretics as prescribed by cardiology, legs look much better. States weight was up to 196 before he came in Weight  this am 186  DM- cbg 115-128, A1C last 5.7%   Chronic pain- due to back, knees, lymphedema, needs to resume his regular pain script    Review Of Systems:  GEN- denies fatigue, fever, weight loss,weakness, recent illness HEENT- denies eye drainage, change in vision, nasal discharge, CVS- denies chest pain, palpitations RESP- denies SOB, cough, wheeze ABD- denies N/V, change in stools, abd pain GU- denies dysuria, hematuria, dribbling, incontinence MSK- +joint pain, muscle aches, injury Neuro- denies headache, dizziness, syncope, seizure activity       Objective:    BP 140/68   Pulse 78   Temp 98.8 F (37.1 C) (Oral)   Resp 16   SpO2 98%  GEN-NAD, alert and oriented x 3. Sitting in wheelchair  CVS- RRR, no murmur RESP-CTAB ABD-NABS,soft,NT,ND EXT- chronic lymphedema with venous statis changes, no blisters noted L > R with mild ballooning over feet  Pulses- Radial 2+ DP not palpated        Assessment & Plan:      Problem List Items Addressed This Visit      Unprioritized   Chronic pain syndrome    Resume his chronic pain script 120 tabs per month, advised not to take more than prescribed so he does not run out      Lymphedema - Primary    Improved, continue use of ACE wraps as needed to keep fluid down No change in diuretics Continue with Home health a few more sessions to ensure fluid and weight adequate          Note: This dictation was prepared with Dragon dictation  along with smaller phrase technology. Any transcriptional errors that result from this process are unintentional.

## 2018-05-06 NOTE — Patient Instructions (Signed)
F/U as previous

## 2018-05-07 ENCOUNTER — Encounter: Payer: Self-pay | Admitting: Family Medicine

## 2018-05-07 DIAGNOSIS — M4807 Spinal stenosis, lumbosacral region: Secondary | ICD-10-CM | POA: Diagnosis not present

## 2018-05-07 DIAGNOSIS — E1149 Type 2 diabetes mellitus with other diabetic neurological complication: Secondary | ICD-10-CM | POA: Diagnosis not present

## 2018-05-07 DIAGNOSIS — I890 Lymphedema, not elsewhere classified: Secondary | ICD-10-CM | POA: Diagnosis not present

## 2018-05-07 DIAGNOSIS — Z7982 Long term (current) use of aspirin: Secondary | ICD-10-CM | POA: Diagnosis not present

## 2018-05-07 DIAGNOSIS — E782 Mixed hyperlipidemia: Secondary | ICD-10-CM | POA: Diagnosis not present

## 2018-05-07 DIAGNOSIS — Z794 Long term (current) use of insulin: Secondary | ICD-10-CM | POA: Diagnosis not present

## 2018-05-07 DIAGNOSIS — E039 Hypothyroidism, unspecified: Secondary | ICD-10-CM | POA: Diagnosis not present

## 2018-05-07 DIAGNOSIS — M5136 Other intervertebral disc degeneration, lumbar region: Secondary | ICD-10-CM | POA: Diagnosis not present

## 2018-05-07 DIAGNOSIS — G622 Polyneuropathy due to other toxic agents: Secondary | ICD-10-CM | POA: Diagnosis not present

## 2018-05-07 DIAGNOSIS — Z79891 Long term (current) use of opiate analgesic: Secondary | ICD-10-CM | POA: Diagnosis not present

## 2018-05-07 NOTE — Assessment & Plan Note (Signed)
Resume his chronic pain script 120 tabs per month, advised not to take more than prescribed so he does not run out

## 2018-05-07 NOTE — Assessment & Plan Note (Signed)
Improved, continue use of ACE wraps as needed to keep fluid down No change in diuretics Continue with Home health a few more sessions to ensure fluid and weight adequate

## 2018-05-11 DIAGNOSIS — I890 Lymphedema, not elsewhere classified: Secondary | ICD-10-CM | POA: Diagnosis not present

## 2018-05-11 DIAGNOSIS — Z7982 Long term (current) use of aspirin: Secondary | ICD-10-CM | POA: Diagnosis not present

## 2018-05-11 DIAGNOSIS — E782 Mixed hyperlipidemia: Secondary | ICD-10-CM | POA: Diagnosis not present

## 2018-05-11 DIAGNOSIS — M4807 Spinal stenosis, lumbosacral region: Secondary | ICD-10-CM | POA: Diagnosis not present

## 2018-05-11 DIAGNOSIS — Z794 Long term (current) use of insulin: Secondary | ICD-10-CM | POA: Diagnosis not present

## 2018-05-11 DIAGNOSIS — E1149 Type 2 diabetes mellitus with other diabetic neurological complication: Secondary | ICD-10-CM | POA: Diagnosis not present

## 2018-05-11 DIAGNOSIS — G622 Polyneuropathy due to other toxic agents: Secondary | ICD-10-CM | POA: Diagnosis not present

## 2018-05-11 DIAGNOSIS — E1140 Type 2 diabetes mellitus with diabetic neuropathy, unspecified: Secondary | ICD-10-CM | POA: Diagnosis not present

## 2018-05-11 DIAGNOSIS — M5136 Other intervertebral disc degeneration, lumbar region: Secondary | ICD-10-CM | POA: Diagnosis not present

## 2018-05-11 DIAGNOSIS — E1151 Type 2 diabetes mellitus with diabetic peripheral angiopathy without gangrene: Secondary | ICD-10-CM | POA: Diagnosis not present

## 2018-05-11 DIAGNOSIS — Z79891 Long term (current) use of opiate analgesic: Secondary | ICD-10-CM | POA: Diagnosis not present

## 2018-05-11 DIAGNOSIS — E039 Hypothyroidism, unspecified: Secondary | ICD-10-CM | POA: Diagnosis not present

## 2018-05-12 DIAGNOSIS — I890 Lymphedema, not elsewhere classified: Secondary | ICD-10-CM | POA: Diagnosis not present

## 2018-05-12 DIAGNOSIS — E039 Hypothyroidism, unspecified: Secondary | ICD-10-CM | POA: Diagnosis not present

## 2018-05-12 DIAGNOSIS — M5136 Other intervertebral disc degeneration, lumbar region: Secondary | ICD-10-CM | POA: Diagnosis not present

## 2018-05-12 DIAGNOSIS — Z79891 Long term (current) use of opiate analgesic: Secondary | ICD-10-CM | POA: Diagnosis not present

## 2018-05-12 DIAGNOSIS — G622 Polyneuropathy due to other toxic agents: Secondary | ICD-10-CM | POA: Diagnosis not present

## 2018-05-12 DIAGNOSIS — E782 Mixed hyperlipidemia: Secondary | ICD-10-CM | POA: Diagnosis not present

## 2018-05-12 DIAGNOSIS — Z7982 Long term (current) use of aspirin: Secondary | ICD-10-CM | POA: Diagnosis not present

## 2018-05-12 DIAGNOSIS — E1149 Type 2 diabetes mellitus with other diabetic neurological complication: Secondary | ICD-10-CM | POA: Diagnosis not present

## 2018-05-12 DIAGNOSIS — Z794 Long term (current) use of insulin: Secondary | ICD-10-CM | POA: Diagnosis not present

## 2018-05-12 DIAGNOSIS — M4807 Spinal stenosis, lumbosacral region: Secondary | ICD-10-CM | POA: Diagnosis not present

## 2018-05-14 DIAGNOSIS — M4807 Spinal stenosis, lumbosacral region: Secondary | ICD-10-CM | POA: Diagnosis not present

## 2018-05-14 DIAGNOSIS — Z7982 Long term (current) use of aspirin: Secondary | ICD-10-CM | POA: Diagnosis not present

## 2018-05-14 DIAGNOSIS — I890 Lymphedema, not elsewhere classified: Secondary | ICD-10-CM | POA: Diagnosis not present

## 2018-05-14 DIAGNOSIS — G622 Polyneuropathy due to other toxic agents: Secondary | ICD-10-CM | POA: Diagnosis not present

## 2018-05-14 DIAGNOSIS — M5136 Other intervertebral disc degeneration, lumbar region: Secondary | ICD-10-CM | POA: Diagnosis not present

## 2018-05-14 DIAGNOSIS — E782 Mixed hyperlipidemia: Secondary | ICD-10-CM | POA: Diagnosis not present

## 2018-05-14 DIAGNOSIS — Z794 Long term (current) use of insulin: Secondary | ICD-10-CM | POA: Diagnosis not present

## 2018-05-14 DIAGNOSIS — E1149 Type 2 diabetes mellitus with other diabetic neurological complication: Secondary | ICD-10-CM | POA: Diagnosis not present

## 2018-05-14 DIAGNOSIS — Z79891 Long term (current) use of opiate analgesic: Secondary | ICD-10-CM | POA: Diagnosis not present

## 2018-05-14 DIAGNOSIS — E039 Hypothyroidism, unspecified: Secondary | ICD-10-CM | POA: Diagnosis not present

## 2018-05-18 DIAGNOSIS — I890 Lymphedema, not elsewhere classified: Secondary | ICD-10-CM | POA: Diagnosis not present

## 2018-05-18 DIAGNOSIS — E782 Mixed hyperlipidemia: Secondary | ICD-10-CM | POA: Diagnosis not present

## 2018-05-18 DIAGNOSIS — E1149 Type 2 diabetes mellitus with other diabetic neurological complication: Secondary | ICD-10-CM | POA: Diagnosis not present

## 2018-05-18 DIAGNOSIS — E039 Hypothyroidism, unspecified: Secondary | ICD-10-CM | POA: Diagnosis not present

## 2018-05-18 DIAGNOSIS — M5136 Other intervertebral disc degeneration, lumbar region: Secondary | ICD-10-CM | POA: Diagnosis not present

## 2018-05-18 DIAGNOSIS — Z7982 Long term (current) use of aspirin: Secondary | ICD-10-CM | POA: Diagnosis not present

## 2018-05-18 DIAGNOSIS — E119 Type 2 diabetes mellitus without complications: Secondary | ICD-10-CM | POA: Diagnosis not present

## 2018-05-18 DIAGNOSIS — H2013 Chronic iridocyclitis, bilateral: Secondary | ICD-10-CM | POA: Diagnosis not present

## 2018-05-18 DIAGNOSIS — Z79891 Long term (current) use of opiate analgesic: Secondary | ICD-10-CM | POA: Diagnosis not present

## 2018-05-18 DIAGNOSIS — M4807 Spinal stenosis, lumbosacral region: Secondary | ICD-10-CM | POA: Diagnosis not present

## 2018-05-18 DIAGNOSIS — Z961 Presence of intraocular lens: Secondary | ICD-10-CM | POA: Diagnosis not present

## 2018-05-18 DIAGNOSIS — Z794 Long term (current) use of insulin: Secondary | ICD-10-CM | POA: Diagnosis not present

## 2018-05-18 DIAGNOSIS — G622 Polyneuropathy due to other toxic agents: Secondary | ICD-10-CM | POA: Diagnosis not present

## 2018-05-18 DIAGNOSIS — H40013 Open angle with borderline findings, low risk, bilateral: Secondary | ICD-10-CM | POA: Diagnosis not present

## 2018-05-18 LAB — HM DIABETES EYE EXAM

## 2018-05-20 DIAGNOSIS — M4807 Spinal stenosis, lumbosacral region: Secondary | ICD-10-CM | POA: Diagnosis not present

## 2018-05-20 DIAGNOSIS — I890 Lymphedema, not elsewhere classified: Secondary | ICD-10-CM | POA: Diagnosis not present

## 2018-05-20 DIAGNOSIS — Z794 Long term (current) use of insulin: Secondary | ICD-10-CM | POA: Diagnosis not present

## 2018-05-20 DIAGNOSIS — G622 Polyneuropathy due to other toxic agents: Secondary | ICD-10-CM | POA: Diagnosis not present

## 2018-05-20 DIAGNOSIS — Z79891 Long term (current) use of opiate analgesic: Secondary | ICD-10-CM | POA: Diagnosis not present

## 2018-05-20 DIAGNOSIS — E039 Hypothyroidism, unspecified: Secondary | ICD-10-CM | POA: Diagnosis not present

## 2018-05-20 DIAGNOSIS — E1149 Type 2 diabetes mellitus with other diabetic neurological complication: Secondary | ICD-10-CM | POA: Diagnosis not present

## 2018-05-20 DIAGNOSIS — M5136 Other intervertebral disc degeneration, lumbar region: Secondary | ICD-10-CM | POA: Diagnosis not present

## 2018-05-20 DIAGNOSIS — E782 Mixed hyperlipidemia: Secondary | ICD-10-CM | POA: Diagnosis not present

## 2018-05-20 DIAGNOSIS — Z7982 Long term (current) use of aspirin: Secondary | ICD-10-CM | POA: Diagnosis not present

## 2018-05-25 DIAGNOSIS — E782 Mixed hyperlipidemia: Secondary | ICD-10-CM | POA: Diagnosis not present

## 2018-05-25 DIAGNOSIS — G622 Polyneuropathy due to other toxic agents: Secondary | ICD-10-CM | POA: Diagnosis not present

## 2018-05-25 DIAGNOSIS — M5136 Other intervertebral disc degeneration, lumbar region: Secondary | ICD-10-CM | POA: Diagnosis not present

## 2018-05-25 DIAGNOSIS — E1149 Type 2 diabetes mellitus with other diabetic neurological complication: Secondary | ICD-10-CM | POA: Diagnosis not present

## 2018-05-25 DIAGNOSIS — Z7982 Long term (current) use of aspirin: Secondary | ICD-10-CM | POA: Diagnosis not present

## 2018-05-25 DIAGNOSIS — I890 Lymphedema, not elsewhere classified: Secondary | ICD-10-CM | POA: Diagnosis not present

## 2018-05-25 DIAGNOSIS — M4807 Spinal stenosis, lumbosacral region: Secondary | ICD-10-CM | POA: Diagnosis not present

## 2018-05-25 DIAGNOSIS — E039 Hypothyroidism, unspecified: Secondary | ICD-10-CM | POA: Diagnosis not present

## 2018-05-25 DIAGNOSIS — Z794 Long term (current) use of insulin: Secondary | ICD-10-CM | POA: Diagnosis not present

## 2018-05-25 DIAGNOSIS — Z79891 Long term (current) use of opiate analgesic: Secondary | ICD-10-CM | POA: Diagnosis not present

## 2018-05-27 ENCOUNTER — Other Ambulatory Visit: Payer: Self-pay | Admitting: Family Medicine

## 2018-05-27 DIAGNOSIS — E039 Hypothyroidism, unspecified: Secondary | ICD-10-CM | POA: Diagnosis not present

## 2018-05-27 DIAGNOSIS — Z79891 Long term (current) use of opiate analgesic: Secondary | ICD-10-CM | POA: Diagnosis not present

## 2018-05-27 DIAGNOSIS — I890 Lymphedema, not elsewhere classified: Secondary | ICD-10-CM | POA: Diagnosis not present

## 2018-05-27 DIAGNOSIS — Z7982 Long term (current) use of aspirin: Secondary | ICD-10-CM | POA: Diagnosis not present

## 2018-05-27 DIAGNOSIS — M5136 Other intervertebral disc degeneration, lumbar region: Secondary | ICD-10-CM | POA: Diagnosis not present

## 2018-05-27 DIAGNOSIS — E782 Mixed hyperlipidemia: Secondary | ICD-10-CM | POA: Diagnosis not present

## 2018-05-27 DIAGNOSIS — G622 Polyneuropathy due to other toxic agents: Secondary | ICD-10-CM | POA: Diagnosis not present

## 2018-05-27 DIAGNOSIS — E1149 Type 2 diabetes mellitus with other diabetic neurological complication: Secondary | ICD-10-CM | POA: Diagnosis not present

## 2018-05-27 DIAGNOSIS — M4807 Spinal stenosis, lumbosacral region: Secondary | ICD-10-CM | POA: Diagnosis not present

## 2018-05-27 DIAGNOSIS — Z794 Long term (current) use of insulin: Secondary | ICD-10-CM | POA: Diagnosis not present

## 2018-05-27 NOTE — Telephone Encounter (Signed)
Ok to refill Oxycodone/APAP??  Last office visit/  refill 05/06/2018.

## 2018-06-04 ENCOUNTER — Telehealth: Payer: Self-pay | Admitting: *Deleted

## 2018-06-04 DIAGNOSIS — G8929 Other chronic pain: Secondary | ICD-10-CM | POA: Diagnosis not present

## 2018-06-04 DIAGNOSIS — I5032 Chronic diastolic (congestive) heart failure: Secondary | ICD-10-CM | POA: Diagnosis not present

## 2018-06-04 DIAGNOSIS — I890 Lymphedema, not elsewhere classified: Secondary | ICD-10-CM | POA: Diagnosis not present

## 2018-06-04 DIAGNOSIS — M5137 Other intervertebral disc degeneration, lumbosacral region: Secondary | ICD-10-CM | POA: Diagnosis not present

## 2018-06-04 DIAGNOSIS — M4807 Spinal stenosis, lumbosacral region: Secondary | ICD-10-CM | POA: Diagnosis not present

## 2018-06-04 DIAGNOSIS — D509 Iron deficiency anemia, unspecified: Secondary | ICD-10-CM | POA: Diagnosis not present

## 2018-06-04 DIAGNOSIS — K7460 Unspecified cirrhosis of liver: Secondary | ICD-10-CM | POA: Diagnosis not present

## 2018-06-04 DIAGNOSIS — E1142 Type 2 diabetes mellitus with diabetic polyneuropathy: Secondary | ICD-10-CM | POA: Diagnosis not present

## 2018-06-04 NOTE — Telephone Encounter (Signed)
Received call from [place], [facility] SN with [place]. [phone]~ telephone.   Reports that she has been out to eval patient and is requesting orders to extend [facility] SN for a few weeks to continue to monitor lymphedema. States that patient has minimal edema at this time and no open or blistering areas. Does not require intervention at this time, but will stay in home for a few weeks to monitor.   VO given.

## 2018-06-04 NOTE — Telephone Encounter (Signed)
ok 

## 2018-06-09 DIAGNOSIS — D509 Iron deficiency anemia, unspecified: Secondary | ICD-10-CM | POA: Diagnosis not present

## 2018-06-09 DIAGNOSIS — K7460 Unspecified cirrhosis of liver: Secondary | ICD-10-CM | POA: Diagnosis not present

## 2018-06-09 DIAGNOSIS — M4807 Spinal stenosis, lumbosacral region: Secondary | ICD-10-CM | POA: Diagnosis not present

## 2018-06-09 DIAGNOSIS — E1142 Type 2 diabetes mellitus with diabetic polyneuropathy: Secondary | ICD-10-CM | POA: Diagnosis not present

## 2018-06-09 DIAGNOSIS — M5137 Other intervertebral disc degeneration, lumbosacral region: Secondary | ICD-10-CM | POA: Diagnosis not present

## 2018-06-09 DIAGNOSIS — I890 Lymphedema, not elsewhere classified: Secondary | ICD-10-CM | POA: Diagnosis not present

## 2018-06-09 DIAGNOSIS — I5032 Chronic diastolic (congestive) heart failure: Secondary | ICD-10-CM | POA: Diagnosis not present

## 2018-06-09 DIAGNOSIS — G8929 Other chronic pain: Secondary | ICD-10-CM | POA: Diagnosis not present

## 2018-06-15 ENCOUNTER — Other Ambulatory Visit: Payer: Self-pay | Admitting: Family Medicine

## 2018-06-15 DIAGNOSIS — H2013 Chronic iridocyclitis, bilateral: Secondary | ICD-10-CM | POA: Diagnosis not present

## 2018-06-15 DIAGNOSIS — Z961 Presence of intraocular lens: Secondary | ICD-10-CM | POA: Diagnosis not present

## 2018-06-18 ENCOUNTER — Telehealth: Payer: Self-pay | Admitting: Family Medicine

## 2018-06-18 DIAGNOSIS — E1142 Type 2 diabetes mellitus with diabetic polyneuropathy: Secondary | ICD-10-CM | POA: Diagnosis not present

## 2018-06-18 DIAGNOSIS — K7460 Unspecified cirrhosis of liver: Secondary | ICD-10-CM | POA: Diagnosis not present

## 2018-06-18 DIAGNOSIS — M5137 Other intervertebral disc degeneration, lumbosacral region: Secondary | ICD-10-CM | POA: Diagnosis not present

## 2018-06-18 DIAGNOSIS — I890 Lymphedema, not elsewhere classified: Secondary | ICD-10-CM | POA: Diagnosis not present

## 2018-06-18 DIAGNOSIS — I5032 Chronic diastolic (congestive) heart failure: Secondary | ICD-10-CM | POA: Diagnosis not present

## 2018-06-18 DIAGNOSIS — D509 Iron deficiency anemia, unspecified: Secondary | ICD-10-CM | POA: Diagnosis not present

## 2018-06-18 DIAGNOSIS — G8929 Other chronic pain: Secondary | ICD-10-CM | POA: Diagnosis not present

## 2018-06-18 DIAGNOSIS — M4807 Spinal stenosis, lumbosacral region: Secondary | ICD-10-CM | POA: Diagnosis not present

## 2018-06-18 NOTE — Telephone Encounter (Signed)
Received a phone call from Michael [name] asking for an extension on Home Health due to patient having another open would on leg that is weeping. They were requesting verbal orders to do calcium alginate and curlex, and ace bandages to site vs unna boots as patient is non ambulatory. Verbal orders given

## 2018-06-18 NOTE — Telephone Encounter (Signed)
sure

## 2018-06-19 DIAGNOSIS — I890 Lymphedema, not elsewhere classified: Secondary | ICD-10-CM | POA: Diagnosis not present

## 2018-06-23 DIAGNOSIS — E1142 Type 2 diabetes mellitus with diabetic polyneuropathy: Secondary | ICD-10-CM | POA: Diagnosis not present

## 2018-06-23 DIAGNOSIS — M4807 Spinal stenosis, lumbosacral region: Secondary | ICD-10-CM | POA: Diagnosis not present

## 2018-06-23 DIAGNOSIS — K7460 Unspecified cirrhosis of liver: Secondary | ICD-10-CM | POA: Diagnosis not present

## 2018-06-23 DIAGNOSIS — D509 Iron deficiency anemia, unspecified: Secondary | ICD-10-CM | POA: Diagnosis not present

## 2018-06-23 DIAGNOSIS — M5137 Other intervertebral disc degeneration, lumbosacral region: Secondary | ICD-10-CM | POA: Diagnosis not present

## 2018-06-23 DIAGNOSIS — G8929 Other chronic pain: Secondary | ICD-10-CM | POA: Diagnosis not present

## 2018-06-23 DIAGNOSIS — I890 Lymphedema, not elsewhere classified: Secondary | ICD-10-CM | POA: Diagnosis not present

## 2018-06-23 DIAGNOSIS — I5032 Chronic diastolic (congestive) heart failure: Secondary | ICD-10-CM | POA: Diagnosis not present

## 2018-06-24 DIAGNOSIS — R809 Proteinuria, unspecified: Secondary | ICD-10-CM | POA: Diagnosis not present

## 2018-06-24 DIAGNOSIS — L89312 Pressure ulcer of right buttock, stage 2: Secondary | ICD-10-CM | POA: Diagnosis not present

## 2018-06-24 DIAGNOSIS — M5137 Other intervertebral disc degeneration, lumbosacral region: Secondary | ICD-10-CM | POA: Diagnosis not present

## 2018-06-24 DIAGNOSIS — L98421 Non-pressure chronic ulcer of back limited to breakdown of skin: Secondary | ICD-10-CM | POA: Diagnosis not present

## 2018-06-24 DIAGNOSIS — R269 Unspecified abnormalities of gait and mobility: Secondary | ICD-10-CM | POA: Diagnosis not present

## 2018-06-26 ENCOUNTER — Ambulatory Visit (INDEPENDENT_AMBULATORY_CARE_PROVIDER_SITE_OTHER): Payer: Medicare Other | Admitting: Family Medicine

## 2018-06-26 ENCOUNTER — Encounter: Payer: Self-pay | Admitting: Family Medicine

## 2018-06-26 VITALS — BP 140/86 | HR 88 | Temp 98.0°F | Resp 18 | Wt 191.0 lb

## 2018-06-26 DIAGNOSIS — E039 Hypothyroidism, unspecified: Secondary | ICD-10-CM

## 2018-06-26 DIAGNOSIS — E118 Type 2 diabetes mellitus with unspecified complications: Secondary | ICD-10-CM

## 2018-06-26 DIAGNOSIS — Z23 Encounter for immunization: Secondary | ICD-10-CM

## 2018-06-26 DIAGNOSIS — I890 Lymphedema, not elsewhere classified: Secondary | ICD-10-CM

## 2018-06-26 DIAGNOSIS — Z794 Long term (current) use of insulin: Secondary | ICD-10-CM | POA: Diagnosis not present

## 2018-06-26 DIAGNOSIS — G894 Chronic pain syndrome: Secondary | ICD-10-CM | POA: Diagnosis not present

## 2018-06-26 MED ORDER — TRAMADOL HCL 50 MG PO TABS: 50.0000 mg | ORAL_TABLET | Freq: Four times a day (QID) | ORAL | 0 refills | Status: DC | PRN

## 2018-06-26 NOTE — Progress Notes (Signed)
Subjective:    Patient ID: Michael [name], male    DOB: 21-Dec-1945, 72 y.o.   MRN: [id number]  HPI  Patient is a 72 year old white male here today requesting a refill on his pain medication.  I have previously not met this person.  I have reviewed his PCPs last note stating that she will give him 120 tablets to last calendar month.  This would allow 4 tablets/day.  However this is the third month in a row that the patient has used his medication up almost a week to early.  He states that he is having breakthrough pain in his legs.  He is also had to take more pain medication due to dental pain in his mouth.  He is requesting something for pain until he can fill his next prescription.  The primary source of his pain seems to be his legs.  He has lymphedema from his knees to his ankles.  He has +1/10 pitting edema.  There is chronic venous stasis changes to the skin including erythema from the mid shaft of the ankle bilaterally.  There is no evidence of cellulitis.  There is pain with palpation of his legs from his knees to his ankles.  I explained to the patient that I would not refill his pain medication early per the pain agreement that he had with Dr. Buelah Manis.  At this point the patient seemed to become straight it and states that he will have to go to another doctor.  At this point I tried to shift our conversation to discuss his chronic medical conditions which were actually the point to his visit today including his diabetes, hypothyroidism, and congestive heart failure.  His last hemoglobin A1c in June was 5.7.  At that time his insulin was used to 5 units at night in addition to his metformin.  He is not checking his blood sugars but I question if he needs insulin.  So appears to be due for his flu shot.  He is due for a TSH to check his hypothyroidism.  His blood pressure today is 140/86. Past Medical History:  Diagnosis Date  . Arthritis   . Congestive heart disease ([place]) 11/2015  . COPD  (chronic obstructive pulmonary disease) ([place])   . DDD (degenerative disc disease), lumbosacral   . Depression   . Diabetic neuropathy ([place])   . Diastolic dysfunction   . Essential hypertension   . GERD (gastroesophageal reflux disease)   . Hyperlipidemia   . Iron deficiency anemia   . Left knee DJD   . Lumbar spinal stenosis   . Lymphedema 11/2015   BOTH LEGS  . NASH (nonalcoholic steatohepatitis)   . Peripheral edema   . Peripheral edema 10/05/2015  . Type 2 diabetes mellitus ([place])    Past Surgical History:  Procedure Laterality Date  . BIOPSY N/A 07/21/2014   Procedure: BIOPSY;  Surgeon: Daneil Dolin, MD;  Location: [facility];  Service: Endoscopy;  Laterality: N/A;  . CATARACT EXTRACTION W/PHACO Right 07/08/2016   Procedure: CATARACT EXTRACTION PHACO AND INTRAOCULAR LENS PLACEMENT RIGHT EYE;  Surgeon: Tonny Branch, MD;  Location: [facility];  Service: Ophthalmology;  Laterality: Right;  CDE: 9.01  . CATARACT EXTRACTION W/PHACO Left 07/22/2016   Procedure: CATARACT EXTRACTION PHACO AND INTRAOCULAR LENS PLACEMENT LEFT EYE CDE=10.69;  Surgeon: Tonny Branch, MD;  Location: [facility];  Service: Ophthalmology;  Laterality: Left;  left -   . COLONOSCOPY  2011   [facility]: normal colon, normal distal ileum  .  COLONOSCOPY WITH PROPOFOL N/A 07/21/2014   Procedure: ATTEMPTED COLONOSCOPY WITH PROPOFOL-HAD TO STOP DUE TO BRADYCARDIA;  Surgeon: Daneil Dolin, MD;  Location: [facility];  Service: Endoscopy;  Laterality: N/A;  . EGD with enteroscopy  2011   [facility]: normal esophagus and stomach. Normal duodenum, jejunum. No evidence of AVMs.   . ESOPHAGOGASTRODUODENOSCOPY (EGD) WITH PROPOFOL N/A 07/21/2014   Procedure: ESOPHAGOGASTRODUODENOSCOPY (EGD) WITH PROPOFOL;  Surgeon: Daneil Dolin, MD;  Location: [facility];  Service: Endoscopy;  Laterality: N/A;  . HERNIA REPAIR    . KNEE ARTHROSCOPY WITH MEDIAL MENISECTOMY Left 11/06/2012   Procedure: KNEE ARTHROSCOPY WITH MEDIAL MENISECTOMY;  Surgeon: Carole Civil, MD;  Location: [facility];  Service: Orthopedics;  Laterality: Left;  . Lipoma removal     Stomach  . LUMBAR LAMINECTOMY/DECOMPRESSION MICRODISCECTOMY Left 08/30/2013   Procedure: LUMBAR LAMINECTOMY/DECOMPRESSION MICRODISCECTOMY LEFT  LUMBAR TWO THREE;  Surgeon: Otilio Connors, MD;  Location: [facility];  Service: Neurosurgery;  Laterality: Left;  . SHOULDER SURGERY     Rght-rotator cuff  . TOOTH EXTRACTION     Current Outpatient Medications on File Prior to Visit  Medication Sig Dispense Refill  . aspirin EC 81 MG tablet Take 81 mg by mouth daily.    . B-D ULTRAFINE III SHORT PEN 31G X 8 MM MISC USE AS DIRECTED DAILY WITH LANTUS. 100 each 0  . benazepril (LOTENSIN) 40 MG tablet TAKE ONE TABLET BY MOUTH DAILY. 90 tablet 0  . Blood Glucose Monitoring Suppl (ACCU-CHEK AVIVA PLUS) w/Device KIT Use as directed to monitor FSBS 2x daily. Dx: E11.9. 1 kit 1  . bumetanide (BUMEX) 2 MG tablet TAKE 1&1/2 TABLETS BY MOUTH TWICE DAILY. (Patient taking differently: TAKE 2 MG (1 TABLET) TABLETS BY MOUTH TWICE DAILY.) 135 tablet 3  . carvedilol (COREG) 3.125 MG tablet TAKE 1 TABLET BY MOUTH TWO TIMES DAILY WITH A MEAL. 60 tablet 0  . diazepam (VALIUM) 5 MG tablet TAKE 1 TABLET BY MOUTH AT BEDTIME AS NEEDED FOR ANXIETY. 30 tablet 3  . diclofenac sodium (VOLTAREN) 1 % GEL Apply 4 g topically 4 (four) times daily as needed (pain). 100 g 3  . Ferrous Sulfate (IRON) 325 (65 FE) MG TABS Take 1 tablet by mouth daily.     Marland Kitchen gabapentin (NEURONTIN) 400 MG capsule Take 1 capsule (400 mg total) by mouth 3 (three) times daily. 90 capsule 0  . glucose blood (ACCU-CHEK AVIVA PLUS) test strip Use as directed to monitor FSBS 2x daily. Dx: E11.9. 150 each 1  . ibuprofen (ADVIL,MOTRIN) 800 MG tablet Take 800 mg by mouth 3 (three) times daily as needed. For pain    . Insulin Glargine (LANTUS SOLOSTAR) 100 UNIT/ML Solostar Pen Inject 5 Units into the skin daily at 10 pm. 15 mL 0  . levothyroxine (SYNTHROID, LEVOTHROID)  100 MCG tablet Take 1 tablet (100 mcg total) by mouth daily before breakfast. 30 tablet 3  . magnesium oxide (MAG-OX) 400 MG tablet Take 1 tablet (400 mg total) by mouth 2 (two) times daily. 60 tablet 11  . metFORMIN (GLUCOPHAGE) 1000 MG tablet TAKE ONE TABLET BY MOUTH TWICE DAILY WITH A MEAL. 180 tablet 3  . metolazone (ZAROXOLYN) 2.5 MG tablet TAKE EVERY Tuesday AND Thursday (Patient taking differently: TAKE EVERY Monday AND Friday) 45 tablet 3  . nystatin (MYCOSTATIN/NYSTOP) powder Apply topically 4 (four) times daily. 15 g 0  . nystatin cream (MYCOSTATIN) APPLY TO AFFECTED AREA TWICE DAILY. 30 g 0  . oxyCODONE-acetaminophen (  PERCOCET) 7.5-325 MG tablet TAKE 1 TABLET BY MOUTH EVERY 6 HOURS AS NEEDED FOR SEVERE PAIN. 120 tablet 0  . pantoprazole (PROTONIX) 40 MG tablet Take 1 tablet (40 mg total) by mouth daily. 30 tablet 11  . pravastatin (PRAVACHOL) 40 MG tablet TAKE ONE TABLET BY MOUTH DAILY. (Patient taking differently: TAKE ONE TABLET BY MOUTH DAILY IN THE EVENING) 90 tablet 0  . PROLENSA 0.07 % SOLN Place 1 drop into both eyes as directed. To complete course on left 08/05/16 and to begin therapy for right eye on 07/19/16 (once daily)    . traZODone (DESYREL) 50 MG tablet TAKE 1/2-1 TABLET BY MOUTH AT BEDTIME AS NEEDED FOR SLEEP. 90 tablet 0  . potassium chloride (K-DUR) 10 MEQ tablet Take 1 tablet (10 mEq total) by mouth 2 (two) times daily. 180 tablet 3   No current facility-administered medications on file prior to visit.    No Known Allergies Social History   Socioeconomic History  . Marital status: Widowed    Spouse name: Not on file  . Number of children: Not on file  . Years of education: 74  . Highest education level: Not on file  Occupational History  . Not on file  Social Needs  . Financial resource strain: Not on file  . Food insecurity:    Worry: Not on file    Inability: Not on file  . Transportation needs:    Medical: Not on file    Non-medical: Not on file    Tobacco Use  . Smoking status: Former Smoker    Packs/day: 2.50    Years: 44.00    Pack years: 110.00    Types: Cigarettes    Start date: 05/07/1955    Last attempt to quit: 10/01/1999    Years since quitting: 18.7  . Smokeless tobacco: Never Used  Substance and Sexual Activity  . Alcohol use: No    Alcohol/week: 0.0 standard drinks  . Drug use: No  . Sexual activity: Yes    Birth control/protection: None  Lifestyle  . Physical activity:    Days per week: Not on file    Minutes per session: Not on file  . Stress: Not on file  Relationships  . Social connections:    Talks on phone: Not on file    Gets together: Not on file    Attends religious service: Not on file    Active member of club or organization: Not on file    Attends meetings of clubs or organizations: Not on file    Relationship status: Not on file  . Intimate partner violence:    Fear of current or ex partner: Not on file    Emotionally abused: Not on file    Physically abused: Not on file    Forced sexual activity: Not on file  Other Topics Concern  . Not on file  Social History Narrative  . Not on file     Review of Systems  All other systems reviewed and are negative.      Objective:   Physical Exam  Constitutional: He appears well-developed and well-nourished.  Neck: No JVD present.  Cardiovascular: Normal rate, regular rhythm and normal heart sounds. Exam reveals no friction rub.  No murmur heard. Pulmonary/Chest: Effort normal and breath sounds normal. No stridor. No respiratory distress. He has no wheezes. He has no rales.  Abdominal: Soft. Bowel sounds are normal. He exhibits no distension and no mass. There is no tenderness. There is no  rebound and no guarding.  Musculoskeletal: He exhibits edema.  Skin: There is erythema.  Vitals reviewed.         Assessment & Plan:  Controlled type 2 diabetes mellitus with complication, with long-term current use of insulin ([place]) - Plan: Hemoglobin  A1c, COMPLETE METABOLIC PANEL WITH GFR, Microalbumin, urine, Hemoglobin A1c, Lipid panel  Hypothyroidism, unspecified type - Plan: TSH  Need for prophylactic vaccination and inoculation against influenza - Plan: Flu vaccine HIGH DOSE PF  Lymphedema - Plan: Ambulatory referral to Pain Clinic  Chronic pain syndrome - Plan: Ambulatory referral to Pain Clinic  The vast majority of her appointment today was consumed discussing his pain medication.  I will not increase the quantity of opiates that he is receiving per month.  He will have to wait until his next prescription is due before I will give him a refill on his opiate pain medication.  He has been instructed previously to take the medications as prescribed, 1 pill 4 times a day, and he again continues to use the medications more frequently.  Therefore I have recommended a referral to a pain clinic.  I believe he is requiring a level of opiates that are beyond the purview of a primary care physician.  I will give him tramadol 50 mg tablets 1 p.o. every 6 hours as needed pain 30 to last until his prescription is ready to be filled in the next week.  He received his flu shot today.  I will check a TSH to monitor his hypothyroidism.  Given his lymphedema is the primary source of his pain, I recommended that we use Unna boots to help manage the edema and hopefully improve the pain but he declines.  I will check a hemoglobin A1c.  If less than 6, I will discontinue insulin.  I will check a fasting lipid panel.  Goal LDL cholesterol is less than 100.  His blood pressure today is adequately controlled at 140/86

## 2018-06-27 LAB — COMPLETE METABOLIC PANEL WITH GFR
AG RATIO: 2 (calc) (ref 1.0–2.5)
ALKALINE PHOSPHATASE (APISO): 60 U/L (ref 40–115)
ALT: 16 U/L (ref 9–46)
AST: 26 U/L (ref 10–35)
Albumin: 4 g/dL (ref 3.6–5.1)
BILIRUBIN TOTAL: 0.4 mg/dL (ref 0.2–1.2)
BUN: 12 mg/dL (ref 7–25)
CHLORIDE: 100 mmol/L (ref 98–110)
CO2: 26 mmol/L (ref 20–32)
Calcium: 9.2 mg/dL (ref 8.6–10.3)
Creat: 0.85 mg/dL (ref 0.70–1.18)
GFR, EST AFRICAN AMERICAN: 102 mL/min/{1.73_m2} (ref >=60)
GFR, Est Non African American: 88 mL/min/{1.73_m2} (ref >=60)
Globulin: 2 g/dL (calc) (ref 1.9–3.7)
Glucose, Bld: 88 mg/dL (ref 65–99)
POTASSIUM: 4.6 mmol/L (ref 3.5–5.3)
Sodium: 137 mmol/L (ref 135–146)
Total Protein: 6 g/dL — ABNORMAL LOW (ref 6.1–8.1)

## 2018-06-27 LAB — HEMOGLOBIN A1C
HEMOGLOBIN A1C: 6.1 %{Hb} — AB (ref <5.7)
MEAN PLASMA GLUCOSE: 128 (calc)
eAG (mmol/L): 7.1 (calc)

## 2018-06-27 LAB — LIPID PANEL
CHOLESTEROL: 146 mg/dL (ref <200)
HDL: 44 mg/dL (ref >40)
LDL Cholesterol (Calc): 72 mg/dL (calc)
Non-HDL Cholesterol (Calc): 102 mg/dL (calc) (ref <130)
TRIGLYCERIDES: 199 mg/dL — AB (ref <150)
Total CHOL/HDL Ratio: 3.3 (calc) (ref <5.0)

## 2018-06-27 LAB — MICROALBUMIN, URINE: MICROALB UR: 1.3 mg/dL

## 2018-06-27 LAB — TSH: TSH: 3 m[IU]/L (ref 0.40–4.50)

## 2018-06-29 ENCOUNTER — Other Ambulatory Visit: Payer: Self-pay | Admitting: Family Medicine

## 2018-06-30 ENCOUNTER — Other Ambulatory Visit: Payer: Self-pay | Admitting: Family Medicine

## 2018-06-30 NOTE — Telephone Encounter (Signed)
Patient requesting a refill on Oxycodone     LOV:  06/26/18  LRF:     05/27/18

## 2018-07-01 DIAGNOSIS — K7460 Unspecified cirrhosis of liver: Secondary | ICD-10-CM | POA: Diagnosis not present

## 2018-07-01 DIAGNOSIS — E1142 Type 2 diabetes mellitus with diabetic polyneuropathy: Secondary | ICD-10-CM | POA: Diagnosis not present

## 2018-07-01 DIAGNOSIS — M5137 Other intervertebral disc degeneration, lumbosacral region: Secondary | ICD-10-CM | POA: Diagnosis not present

## 2018-07-01 DIAGNOSIS — M4807 Spinal stenosis, lumbosacral region: Secondary | ICD-10-CM | POA: Diagnosis not present

## 2018-07-01 DIAGNOSIS — D509 Iron deficiency anemia, unspecified: Secondary | ICD-10-CM | POA: Diagnosis not present

## 2018-07-01 DIAGNOSIS — I5032 Chronic diastolic (congestive) heart failure: Secondary | ICD-10-CM | POA: Diagnosis not present

## 2018-07-01 DIAGNOSIS — I890 Lymphedema, not elsewhere classified: Secondary | ICD-10-CM | POA: Diagnosis not present

## 2018-07-01 DIAGNOSIS — G8929 Other chronic pain: Secondary | ICD-10-CM | POA: Diagnosis not present

## 2018-07-04 ENCOUNTER — Other Ambulatory Visit: Payer: Self-pay | Admitting: Family Medicine

## 2018-07-06 NOTE — Telephone Encounter (Signed)
Ok to refill diazepam??  Last office visit 06/26/2018.  Last refill 03/04/2018, #3 refills.

## 2018-07-07 DIAGNOSIS — M4807 Spinal stenosis, lumbosacral region: Secondary | ICD-10-CM | POA: Diagnosis not present

## 2018-07-07 DIAGNOSIS — I5032 Chronic diastolic (congestive) heart failure: Secondary | ICD-10-CM | POA: Diagnosis not present

## 2018-07-07 DIAGNOSIS — D509 Iron deficiency anemia, unspecified: Secondary | ICD-10-CM | POA: Diagnosis not present

## 2018-07-07 DIAGNOSIS — K7460 Unspecified cirrhosis of liver: Secondary | ICD-10-CM | POA: Diagnosis not present

## 2018-07-07 DIAGNOSIS — G8929 Other chronic pain: Secondary | ICD-10-CM | POA: Diagnosis not present

## 2018-07-07 DIAGNOSIS — E1142 Type 2 diabetes mellitus with diabetic polyneuropathy: Secondary | ICD-10-CM | POA: Diagnosis not present

## 2018-07-07 DIAGNOSIS — I890 Lymphedema, not elsewhere classified: Secondary | ICD-10-CM | POA: Diagnosis not present

## 2018-07-07 DIAGNOSIS — M5137 Other intervertebral disc degeneration, lumbosacral region: Secondary | ICD-10-CM | POA: Diagnosis not present

## 2018-07-14 DIAGNOSIS — K7460 Unspecified cirrhosis of liver: Secondary | ICD-10-CM | POA: Diagnosis not present

## 2018-07-14 DIAGNOSIS — M4807 Spinal stenosis, lumbosacral region: Secondary | ICD-10-CM | POA: Diagnosis not present

## 2018-07-14 DIAGNOSIS — E1142 Type 2 diabetes mellitus with diabetic polyneuropathy: Secondary | ICD-10-CM | POA: Diagnosis not present

## 2018-07-14 DIAGNOSIS — G8929 Other chronic pain: Secondary | ICD-10-CM | POA: Diagnosis not present

## 2018-07-14 DIAGNOSIS — D509 Iron deficiency anemia, unspecified: Secondary | ICD-10-CM | POA: Diagnosis not present

## 2018-07-14 DIAGNOSIS — I890 Lymphedema, not elsewhere classified: Secondary | ICD-10-CM | POA: Diagnosis not present

## 2018-07-14 DIAGNOSIS — M5137 Other intervertebral disc degeneration, lumbosacral region: Secondary | ICD-10-CM | POA: Diagnosis not present

## 2018-07-14 DIAGNOSIS — I5032 Chronic diastolic (congestive) heart failure: Secondary | ICD-10-CM | POA: Diagnosis not present

## 2018-07-17 DIAGNOSIS — I890 Lymphedema, not elsewhere classified: Secondary | ICD-10-CM | POA: Diagnosis not present

## 2018-07-17 DIAGNOSIS — Z79899 Other long term (current) drug therapy: Secondary | ICD-10-CM | POA: Diagnosis not present

## 2018-07-17 DIAGNOSIS — M129 Arthropathy, unspecified: Secondary | ICD-10-CM | POA: Diagnosis not present

## 2018-07-17 DIAGNOSIS — G894 Chronic pain syndrome: Secondary | ICD-10-CM | POA: Diagnosis not present

## 2018-07-24 ENCOUNTER — Other Ambulatory Visit: Payer: Self-pay | Admitting: Family Medicine

## 2018-08-03 DIAGNOSIS — H2013 Chronic iridocyclitis, bilateral: Secondary | ICD-10-CM | POA: Diagnosis not present

## 2018-08-03 DIAGNOSIS — Z961 Presence of intraocular lens: Secondary | ICD-10-CM | POA: Diagnosis not present

## 2018-08-03 LAB — HM DIABETES EYE EXAM

## 2018-08-05 DIAGNOSIS — I890 Lymphedema, not elsewhere classified: Secondary | ICD-10-CM | POA: Diagnosis not present

## 2018-08-05 DIAGNOSIS — G894 Chronic pain syndrome: Secondary | ICD-10-CM | POA: Diagnosis not present

## 2018-08-05 DIAGNOSIS — Z79899 Other long term (current) drug therapy: Secondary | ICD-10-CM | POA: Diagnosis not present

## 2018-08-10 ENCOUNTER — Ambulatory Visit (INDEPENDENT_AMBULATORY_CARE_PROVIDER_SITE_OTHER): Payer: Medicare Other | Admitting: Cardiology

## 2018-08-10 ENCOUNTER — Encounter: Payer: Self-pay | Admitting: Cardiology

## 2018-08-10 VITALS — BP 112/66 | HR 89 | Ht 59.0 in | Wt 185.0 lb

## 2018-08-10 DIAGNOSIS — I5032 Chronic diastolic (congestive) heart failure: Secondary | ICD-10-CM | POA: Diagnosis not present

## 2018-08-10 DIAGNOSIS — R600 Localized edema: Secondary | ICD-10-CM

## 2018-08-10 NOTE — Progress Notes (Signed)
Clinical Summary Michael [name] is a 72 y.o.male seen today for follow up of the following medical problems.   1. Chronic diastolic heart failure/Lymphedema - echo 05/2015 with normal LVEF 60-65%, grade II diastolic dysfunction - Jan 2017 echo LVEF 54-09%, grade I diastolic dysfunction - edema likely combined diastolic HF and lymphedema   - home weights 182-183 lbs and stable.  - takes bumex 87m bid, will metolazone 2 times weekly. Renal function has been stable. [place]     SH: veteran, [place] is a [place]. [place] in the [employer]  Past Medical History:  Diagnosis Date  . Arthritis   . [place]estive heart disease [place]  . COPD (chronic obstructive pulmonary disease) ([place]   . DDD (degenerative disc disease), lumbosacral   . Depression   . Diabetic neuropathy ([place]   . Diastolic dysfunction   . Essential hypertension   . GERD (gastroesophageal reflux disease)   . Hyperlipidemia   . Iron deficiency anemia   . Left knee DJD   . Lumbar spinal stenosis   . Lymphedema 11/2015   BOTH LEGS  . NASH (nonalcoholic steatohepatitis)   . Peripheral edema   . Peripheral edema 10/05/2015  . Type 2 diabetes mellitus ([facility])      No Known Allergies   Current Outpatient Medications  Medication Sig Dispense Refill  . aspirin EC 81 MG tablet Take 81 mg by mouth daily.    . B-D ULTRAFINE III SHORT PEN 31G X 8 MM MISC USE AS DIRECTED DAILY WITH LANTUS. 100 each 0  . benazepril (LOTENSIN) 40 MG tablet TAKE ONE TABLET BY MOUTH DAILY. 90 tablet 0  . Blood Glucose Monitoring Suppl (ACCU-CHEK AVIVA PLUS) w/Device KIT Use as directed to monitor FSBS 2x daily. Dx: E11.9. 1 kit 1  . bumetanide (BUMEX) 2 MG tablet TAKE 1&1/2 TABLETS BY MOUTH TWICE DAILY. (Patient taking differently: TAKE 2 MG (1 TABLET) TABLETS BY MOUTH TWICE DAILY.) 135 tablet 3  . carvedilol (COREG) 3.125 MG tablet TAKE 1 TABLET BY MOUTH TWO TIMES DAILY WITH A MEAL. 60 tablet 0    . diazepam (VALIUM) 5 MG tablet TAKE 1 TABLET BY MOUTH AT BEDTIME AS NEEDED FOR ANXIETY. 30 tablet 0  . diclofenac sodium (VOLTAREN) 1 % GEL Apply 4 g topically 4 (four) times daily as needed (pain). 100 g 3  . Ferrous Sulfate (IRON) 325 (65 FE) MG TABS Take 1 tablet by mouth daily.     .Marland Kitchengabapentin (NEURONTIN) 400 MG capsule Take 1 capsule (400 mg total) by mouth 3 (three) times daily. 90 capsule 0  . glucose blood (ACCU-CHEK AVIVA PLUS) test strip Use as directed to monitor FSBS 2x daily. Dx: E11.9. 150 each 1  . ibuprofen (ADVIL,MOTRIN) 800 MG tablet Take 800 mg by mouth 3 (three) times daily as needed. For pain    . Insulin Glargine (LANTUS SOLOSTAR) 100 UNIT/ML Solostar Pen Inject 5 Units into the skin daily at 10 pm. 15 mL 0  . levothyroxine (SYNTHROID, LEVOTHROID) 100 MCG tablet TAKE 1 TABLET BY MOUTH DAILY BEFORE BREAKFAST. 30 tablet 0  . magnesium oxide (MAG-OX) 400 MG tablet Take 1 tablet (400 mg total) by mouth 2 (two) times daily. 60 tablet 11  . metFORMIN (GLUCOPHAGE) 1000 MG tablet TAKE ONE TABLET BY MOUTH TWICE DAILY WITH A MEAL. 180 tablet 3  . metolazone (ZAROXOLYN) 2.5 MG tablet TAKE EVERY Tuesday AND Thursday (Patient taking differently: TAKE EVERY  Monday AND Friday) 45 tablet 3  . nystatin (MYCOSTATIN/NYSTOP) powder Apply topically 4 (four) times daily. 15 g 0  . nystatin cream (MYCOSTATIN) APPLY TO AFFECTED AREA TWICE DAILY. 30 g 0  . oxyCODONE-acetaminophen (PERCOCET) 7.5-325 MG tablet TAKE 1 TABLET BY MOUTH EVERY 6 HOURS AS NEEDED FOR SEVERE PAIN. 120 tablet 0  . pantoprazole (PROTONIX) 40 MG tablet Take 1 tablet (40 mg total) by mouth daily. 30 tablet 11  . potassium chloride (K-DUR) 10 MEQ tablet Take 1 tablet (10 mEq total) by mouth 2 (two) times daily. 180 tablet 3  . pravastatin (PRAVACHOL) 40 MG tablet TAKE ONE TABLET BY MOUTH DAILY. (Patient taking differently: TAKE ONE TABLET BY MOUTH DAILY IN THE EVENING) 90 tablet 0  . PROLENSA 0.07 % SOLN Place 1 drop into both  eyes as directed. To complete course on left 08/05/16 and to begin therapy for right eye on 07/19/16 (once daily)    . traMADol (ULTRAM) 50 MG tablet Take 1 tablet (50 mg total) by mouth every 6 (six) hours as needed. 30 tablet 0  . traZODone (DESYREL) 50 MG tablet TAKE 1/2-1 TABLET BY MOUTH AT BEDTIME AS NEEDED FOR SLEEP. 90 tablet 0   No current facility-administered medications for this visit.      Past Surgical History:  Procedure Laterality Date  . BIOPSY N/A 07/21/2014   Procedure: BIOPSY;  Surgeon: Daneil Dolin, MD;  Location: [facility];  Service: Endoscopy;  Laterality: N/A;  . CATARACT EXTRACTION W/PHACO Right 07/08/2016   Procedure: CATARACT EXTRACTION PHACO AND INTRAOCULAR LENS PLACEMENT RIGHT EYE;  Surgeon: Tonny , MD;  Location: [facility];  Service: Ophthalmology;  Laterality: Right;  CDE: 9.01  . CATARACT EXTRACTION W/PHACO Left 07/22/2016   Procedure: CATARACT EXTRACTION PHACO AND INTRAOCULAR LENS PLACEMENT LEFT EYE CDE=10.69;  Surgeon: Tonny , MD;  Location: [facility];  Service: Ophthalmology;  Laterality: Left;  left -   . COLONOSCOPY  2011   [facility]: normal colon, normal distal ileum  . COLONOSCOPY WITH PROPOFOL N/A 07/21/2014   Procedure: ATTEMPTED COLONOSCOPY WITH PROPOFOL-HAD TO STOP DUE TO BRADYCARDIA;  Surgeon: Daneil Dolin, MD;  Location: [facility];  Service: Endoscopy;  Laterality: N/A;  . EGD with enteroscopy  2011   [facility]: normal esophagus and stomach. Normal duodenum, jejunum. No evidence of AVMs.   . ESOPHAGOGASTRODUODENOSCOPY (EGD) WITH PROPOFOL N/A 07/21/2014   Procedure: ESOPHAGOGASTRODUODENOSCOPY (EGD) WITH PROPOFOL;  Surgeon: Daneil Dolin, MD;  Location: [facility];  Service: Endoscopy;  Laterality: N/A;  . HERNIA REPAIR    . KNEE ARTHROSCOPY WITH MEDIAL MENISECTOMY Left 11/06/2012   Procedure: KNEE ARTHROSCOPY WITH MEDIAL MENISECTOMY;  Surgeon: Carole Civil, MD;  Location: [facility];  Service: Orthopedics;  Laterality: Left;  . Lipoma removal      Stomach  . LUMBAR LAMINECTOMY/DECOMPRESSION MICRODISCECTOMY Left 08/30/2013   Procedure: LUMBAR LAMINECTOMY/DECOMPRESSION MICRODISCECTOMY LEFT  LUMBAR TWO THREE;  Surgeon: Otilio Connors, MD;  Location: [facility];  Service: Neurosurgery;  Laterality: Left;  . SHOULDER SURGERY     Rght-rotator cuff  . TOOTH EXTRACTION       No Known Allergies    Family History  Problem Relation Age of Onset  . Heart disease Mother   . Hyperlipidemia Mother   . Hypertension Mother   . Depression Mother   . Diabetes Mother   . Rectal cancer Mother   . Cancer Mother   . Heart disease Father   . Hypertension Father   . Hyperlipidemia  Father   . Diabetes Father   . Cancer Father   . Heart disease Sister   . Hyperlipidemia Sister   . Hypertension Sister   . Diabetes Sister   . Diabetes Brother   . Heart disease Sister   . Hyperlipidemia Sister   . Hypertension Sister   . Heart disease Sister   . Hyperlipidemia Sister   . Hypertension Sister   . Diabetes Sister   . Diabetes Brother      Social History Mr. Barga reports that he quit smoking about 18 years ago. His smoking use included cigarettes. He started smoking about 63 years ago. He has a 110.00 pack-year smoking history. He has never used smokeless tobacco. Mr. Tracz reports that he does not drink alcohol.   Review of Systems CONSTITUTIONAL: No weight loss, fever, chills, weakness or fatigue.  HEENT: Eyes: No visual loss, blurred vision, double vision or yellow sclerae.No hearing loss, sneezing, congestion, runny nose or sore throat.  SKIN: No rash or itching.  CARDIOVASCULAR: per hpi RESPIRATORY: No shortness of breath, cough or sputum.  GASTROINTESTINAL: No anorexia, nausea, vomiting or diarrhea. No abdominal pain or blood.  GENITOURINARY: No burning on urination, no polyuria NEUROLOGICAL: No headache, dizziness, syncope, paralysis, ataxia, numbness or tingling in the extremities. No change in bowel or bladder control.    MUSCULOSKELETAL: No muscle, back pain, joint pain or stiffness.  LYMPHATICS: No enlarged nodes. No history of splenectomy.  PSYCHIATRIC: No history of depression or anxiety.  ENDOCRINOLOGIC: No reports of sweating, cold or heat intolerance. No polyuria or polydipsia.  Marland Kitchen   Physical Examination Vitals:   08/10/18 0904  BP: 112/66  Pulse: 89  SpO2: 99%   Vitals:   08/10/18 0904  Weight: 185 lb (83.9 kg)  Height: 4' 11"  (1.499 m)    Gen: resting comfortably, no acute distress HEENT: no scleral icterus, pupils equal round and reactive, no palptable cervical adenopathy,  CV: RRR, no m/r/g, no jvd Resp: Clear to auscultation bilaterally GI: abdomen is soft, non-tender, non-distended, normal bowel sounds, no hepatosplenomegaly MSK: extremities are warm, 1+ bilateral LE edema Skin: warm, no rash Neuro:  no focal deficits Psych: appropriate affect   Diagnostic Studies     Assessment and Plan  1. Chronic diastolic HF/Lymphedema - weights down from last visit, swelling improved. Due to lymphedema don't believe swelling will ever completely resolve - continue diuretics, he is also doing compression therapy at home for his lymphedema - request labs from [facility]   f/u 4 months    [name], M.D

## 2018-08-10 NOTE — Patient Instructions (Signed)
Medication Instructions:  Your physician recommends that you continue on your current medications as directed. Please refer to the Current Medication list given to you today. If you need a refill on your cardiac medications before your next appointment, please call your pharmacy.   Lab work: none If you have labs (blood work) drawn today and your tests are completely normal, you will receive your results only by: Marland Kitchen MyChart Message (if you have MyChart) OR . A paper copy in the mail If you have any lab test that is abnormal or we need to change your treatment, we will call you to review the results.  Testing/Procedures: none  Follow-Up: At [facility], you and your health needs are our priority.  As part of our continuing mission to provide you with exceptional heart care, we have created designated Provider Care Teams.  These Care Teams include your primary Cardiologist (physician) and Advanced Practice Providers (APPs -  Physician Assistants and Nurse Practitioners) who all work together to provide you with the care you need, when you need it. You will need a follow up appointment in 4 months.  Please call our office 2 months in advance to schedule this appointment.  You may see [name], MD or one of the following Advanced Practice Providers on your designated Care Team:   Bernerd Pho, PA-C [facility]) . Ermalinda Barrios, PA-C ([place])  Any Other Special Instructions Will Be Listed Below (If Applicable). None

## 2018-08-14 ENCOUNTER — Telehealth: Payer: Self-pay | Admitting: *Deleted

## 2018-08-14 NOTE — Telephone Encounter (Signed)
Received fax from [facility] with chart notes and labs results. PCP reviewed and new orders received as follows: Repeat CBC (lab visit) Dx: leukopenia Schedule F/U appt in January.   Call placed to patient. States that he is also having leg pain and would like to have X-ray. Appointment scheduled for 08/17/2018 to discuss with PCP.

## 2018-08-17 ENCOUNTER — Other Ambulatory Visit: Payer: Self-pay

## 2018-08-17 ENCOUNTER — Ambulatory Visit (INDEPENDENT_AMBULATORY_CARE_PROVIDER_SITE_OTHER): Payer: Medicare Other | Admitting: Family Medicine

## 2018-08-17 VITALS — BP 120/62 | HR 94 | Temp 98.5°F | Resp 16 | Ht 59.0 in | Wt 189.0 lb

## 2018-08-17 DIAGNOSIS — I890 Lymphedema, not elsewhere classified: Secondary | ICD-10-CM | POA: Diagnosis not present

## 2018-08-17 DIAGNOSIS — E1149 Type 2 diabetes mellitus with other diabetic neurological complication: Secondary | ICD-10-CM

## 2018-08-17 DIAGNOSIS — G894 Chronic pain syndrome: Secondary | ICD-10-CM

## 2018-08-17 DIAGNOSIS — D72819 Decreased white blood cell count, unspecified: Secondary | ICD-10-CM | POA: Diagnosis not present

## 2018-08-17 LAB — CBC WITH DIFFERENTIAL/PLATELET
BASOS ABS: 70 {cells}/uL (ref 0–200)
Basophils Relative: 1.4 %
EOS ABS: 130 {cells}/uL (ref 15–500)
EOS PCT: 2.6 %
HEMATOCRIT: 34.5 % — AB (ref 38.5–50.0)
Hemoglobin: 12 g/dL — ABNORMAL LOW (ref 13.2–17.1)
LYMPHS ABS: 1450 {cells}/uL (ref 850–3900)
MCH: 30.1 pg (ref 27.0–33.0)
MCHC: 34.8 g/dL (ref 32.0–36.0)
MCV: 86.5 fL (ref 80.0–100.0)
MPV: 10.7 fL (ref 7.5–12.5)
Monocytes Relative: 7.8 %
NEUTROS PCT: 59.2 %
Neutro Abs: 2960 cells/uL (ref 1500–7800)
Platelets: 206 10*3/uL (ref 140–400)
RBC: 3.99 10*6/uL — ABNORMAL LOW (ref 4.20–5.80)
RDW: 13.5 % (ref 11.0–15.0)
Total Lymphocyte: 29 %
WBC mixed population: 390 cells/uL (ref 200–950)
WBC: 5 10*3/uL (ref 3.8–10.8)

## 2018-08-17 NOTE — Progress Notes (Signed)
   Subjective:    Patient ID: Michael [name], male    DOB: 29-Oct-1945, 72 y.o.   MRN: [id number]  Patient presents for Follow-up and Edema   Pt here for intermin follow up, had low WBC on labs from pain clinic    [facility]- now following pain medication     Given narcan spray   Given 60 tablets from pain clinic, he has been in more pain in his legs, mostly ends of thighs past 2 weeks , he has been upset his meds were cut  Off valium per their policy as well    Will see podiatry for diabetic shoes at [facility]    DM- last A1C 6.1%, taking 5 units and metformin 1017m twice a day   he stopped insulin after last visit, but then CBG spiked back to 190's  Leg pain lower thighs and knees, has known OA   using voltargen gel his leg pain is worse with the past couple weeks however this is the same setting where his pain medicine was cut by the pain clinic.  He has not had any falls not have any bruising no particular injury no swelling of the knee.  His leg edema has been fairly well-controlled with his diuretics.       Review Of Systems:  GEN- denies fatigue, fever, weight loss,weakness, recent illness HEENT- denies eye drainage, change in vision, nasal discharge, CVS- denies chest pain, palpitations RESP- denies SOB, cough, wheeze ABD- denies N/V, change in stools, abd pain GU- denies dysuria, hematuria, dribbling, incontinence MSK- + joint pain, muscle aches, injury Neuro- denies headache, dizziness, syncope, seizure activity       Objective:    BP 120/62   Pulse 94   Temp 98.5 F (36.9 C) (Oral)   Resp 16   Ht 4' 11"  (1.499 m)   Wt 189 lb (85.7 kg)   SpO2 97%   BMI 38.17 kg/m  GEN- NAD, alert and oriented x3, in wheelchair  HEENT- PERRL, EOMI, non injected sclera, pink conjunctiva, MMM, oropharynx clear CVS- RRR, no murmur RESP-CTAB EXT- chronic lymphedema MSK- Decreased ROM hips/knees, no bruising of thighs  Pulses- Radial, DP- 2+        Assessment &  Plan:      Problem List Items Addressed This Visit      Unprioritized   Chronic pain syndrome    Chronic pain, multiple joints involved I think he has more pain in legs, since he had his pain meds cut by pain clinic Continue voltaren gel, meds per there prescription now Advised to do his home exercises       Lymphedema    Diuretics per cardiology, near his baseline with legs  Chronic pain associated with this and his OA       Type II diabetes mellitus with neurological manifestations (HCC) - Primary    lantus was decreased to 5 units at last visit No hypoglycemia, but without low dose, he rebounds back up Continue , also on MTF If he has hypoglycemia will cut meds further      Relevant Orders   CBC with Differential/Platelet (Completed)    Other Visit Diagnoses    Leukopenia, unspecified type       recheck CBC   Relevant Orders   CBC with Differential/Platelet (Completed)      Note: This dictation was prepared with Dragon dictation along with smaller phrase technology. Any transcriptional errors that result from this process are unintentional.

## 2018-08-17 NOTE — Telephone Encounter (Signed)
noted 

## 2018-08-17 NOTE — Patient Instructions (Addendum)
New glucometer check daily  Continue with pain clinic  Try voltaren gel  We will call with the white blood cells  F/U 3 months

## 2018-08-18 ENCOUNTER — Encounter: Payer: Self-pay | Admitting: Family Medicine

## 2018-08-18 NOTE — Assessment & Plan Note (Addendum)
lantus was decreased to 5 units at last visit No hypoglycemia, but without low dose, he rebounds back up Continue , also on MTF If he has hypoglycemia will cut meds further

## 2018-08-18 NOTE — Assessment & Plan Note (Signed)
Chronic pain, multiple joints involved I think he has more pain in legs, since he had his pain meds cut by pain clinic Continue voltaren gel, meds per there prescription now Advised to do his home exercises

## 2018-08-18 NOTE — Assessment & Plan Note (Signed)
Diuretics per cardiology, near his baseline with legs  Chronic pain associated with this and his OA

## 2018-08-20 ENCOUNTER — Telehealth: Payer: Self-pay | Admitting: *Deleted

## 2018-08-20 MED ORDER — LANCETS MISC: 1 refills | Status: DC

## 2018-08-20 MED ORDER — BLOOD GLUCOSE SYSTEM PAK KIT: PACK | 1 refills | Status: DC

## 2018-08-20 MED ORDER — BLOOD GLUCOSE TEST VI STRP: ORAL_STRIP | 1 refills | Status: DC

## 2018-08-20 NOTE — Telephone Encounter (Signed)
Patient seen in office and requested new orders for glucometer.   Prescription sent to pharmacy.

## 2018-09-01 DIAGNOSIS — Z79899 Other long term (current) drug therapy: Secondary | ICD-10-CM | POA: Diagnosis not present

## 2018-09-01 DIAGNOSIS — I890 Lymphedema, not elsewhere classified: Secondary | ICD-10-CM | POA: Diagnosis not present

## 2018-09-01 DIAGNOSIS — G894 Chronic pain syndrome: Secondary | ICD-10-CM | POA: Diagnosis not present

## 2018-09-29 DIAGNOSIS — I890 Lymphedema, not elsewhere classified: Secondary | ICD-10-CM | POA: Diagnosis not present

## 2018-09-29 DIAGNOSIS — G894 Chronic pain syndrome: Secondary | ICD-10-CM | POA: Diagnosis not present

## 2018-09-29 DIAGNOSIS — Z79899 Other long term (current) drug therapy: Secondary | ICD-10-CM | POA: Diagnosis not present

## 2018-11-18 ENCOUNTER — Other Ambulatory Visit: Payer: Self-pay

## 2018-11-18 ENCOUNTER — Ambulatory Visit (INDEPENDENT_AMBULATORY_CARE_PROVIDER_SITE_OTHER): Payer: Medicare Other | Admitting: Family Medicine

## 2018-11-18 ENCOUNTER — Encounter: Payer: Self-pay | Admitting: Family Medicine

## 2018-11-18 VITALS — BP 128/74 | HR 64 | Temp 98.9°F | Resp 14 | Ht 59.0 in | Wt 204.0 lb

## 2018-11-18 DIAGNOSIS — L309 Dermatitis, unspecified: Secondary | ICD-10-CM

## 2018-11-18 DIAGNOSIS — I890 Lymphedema, not elsewhere classified: Secondary | ICD-10-CM | POA: Diagnosis not present

## 2018-11-18 DIAGNOSIS — E038 Other specified hypothyroidism: Secondary | ICD-10-CM | POA: Diagnosis not present

## 2018-11-18 DIAGNOSIS — I10 Essential (primary) hypertension: Secondary | ICD-10-CM | POA: Diagnosis not present

## 2018-11-18 DIAGNOSIS — E1149 Type 2 diabetes mellitus with other diabetic neurological complication: Secondary | ICD-10-CM

## 2018-11-18 DIAGNOSIS — I5032 Chronic diastolic (congestive) heart failure: Secondary | ICD-10-CM

## 2018-11-18 DIAGNOSIS — F5104 Psychophysiologic insomnia: Secondary | ICD-10-CM

## 2018-11-18 MED ORDER — TRAZODONE HCL 100 MG PO TABS: ORAL_TABLET | ORAL | 2 refills | Status: DC

## 2018-11-18 MED ORDER — TRIAMCINOLONE ACETONIDE 0.1 % EX CREA: 1.0000 "application " | TOPICAL_CREAM | Freq: Two times a day (BID) | CUTANEOUS | 0 refills | Status: DC

## 2018-11-18 NOTE — Assessment & Plan Note (Signed)
Increased weight though not very symptomatic I think this is due to his diet, contributing to his sodium retention Check renal function Take additional metalazone today Unna boots placed on lower ext, return next week for removal

## 2018-11-18 NOTE — Assessment & Plan Note (Signed)
Controlled no changes

## 2018-11-18 NOTE — Progress Notes (Signed)
Subjective:    Patient ID: Michael [name], male    DOB: Jul 29, 1946, 73 y.o.   MRN: [id number]  Patient presents for Follow-up (is fasting); Edema (BLE edema); and Rash (irritation to B arms that itch) Pt here to f/u   Has itching and rash that breaks out on both hands and forerms for past couple months   - using vaseline   Chronic lymphedema- in bilat LE, legs are worse, uses diabetic lotions on elgs   his Aide has been wrapping legs with ACE Wrap. Weight is up 15lbs since November  - he has been eating crackers, chips, junk food at night. He has been eating low sodium soups. His Aide also brings him food    Taking Bumex 1.5 tabs twice a day, and Metalaone twice a week   DM- A1C last  6.1%, CBG this morning 102, Lantus 5 units at bedtime    Chronic pain- followed by pain clinic  Chronic insomnia- taking trazodone at bedtime, but still difficulty sleeping    Followed by [facility]- seen by podiatry, getting diabetic shoes           Review Of Systems:  GEN- denies fatigue, fever, weight loss,weakness, recent illness HEENT- denies eye drainage, change in vision, nasal discharge, CVS- denies chest pain, palpitations RESP- denies SOB, cough, wheeze ABD- denies N/V, change in stools, abd pain GU- denies dysuria, hematuria, dribbling, incontinence MSK- + joint pain, +muscle aches, injury Neuro- denies headache, dizziness, syncope, seizure activity       Objective:    BP 128/74   Pulse 64   Temp 98.9 F (37.2 C) (Oral)   Resp 14   Ht 4' 11"  (1.499 m)   Wt 204 lb (92.5 kg)   SpO2 96%   BMI 41.20 kg/m  GEN- NAD, alert and oriented x3, in wheelchair ,weight up 15lbs since November HEENT- PERRL, EOMI, non injected sclera, pink conjunctiva, MMM, oropharynx clear CVS- RRR, no murmur RESP-CTAB ABD-NABS,soft,NT, a little distended appearing EXT- chronic lymphedema L >R , venous stasis changes  MSK- Decreased ROM hips/knees, no bruising of thighs  Skin- mild erythematous  eczatous patches on left wrist, base of thumb, right forearm, dry skin bilat  Psych-normal affect and mood Pulses- Radial 2+ DP- decreased         Assessment & Plan:      Problem List Items Addressed This Visit      Unprioritized   Chronic diastolic CHF (congestive heart failure) (HCC) - Primary    Increased weight though not very symptomatic I think this is due to his diet, contributing to his sodium retention Check renal function Take additional metalazone today Unna boots placed on lower ext, return next week for removal      Relevant Orders   CBC with Differential/Platelet   Comprehensive metabolic panel   Chronic insomnia    Increase trazodone to 154m at bedtime Avoiding benzo with his pain clinic       Essential hypertension    Controlled no changes       Relevant Orders   Lipid panel   Hypothyroidism   Relevant Orders   TSH   Lymphedema   Type II diabetes mellitus with neurological manifestations (HCC)    Recheck A1C, if less than 6% will d/c insulin On statin drug       Relevant Orders   Hemoglobin A1c   Lipid panel    Other Visit Diagnoses    Dermatitis       mix  of dry skin and eczema like rash, no classic of psoriasis at this time TAC cream given      Note: This dictation was prepared with Dragon dictation along with smaller phrase technology. Any transcriptional errors that result from this process are unintentional.

## 2018-11-18 NOTE — Assessment & Plan Note (Signed)
Increase trazodone to 152m at bedtime Avoiding benzo with his pain clinic

## 2018-11-18 NOTE — Patient Instructions (Addendum)
F/U Monday or Tuesday for unna boot removal  Take extra metalazone today

## 2018-11-18 NOTE — Assessment & Plan Note (Signed)
Recheck A1C, if less than 6% will d/c insulin On statin drug

## 2018-11-19 LAB — LIPID PANEL
Cholesterol: 160 mg/dL (ref <200)
HDL: 43 mg/dL (ref >=40)
LDL Cholesterol (Calc): 90 mg/dL (calc)
Non-HDL Cholesterol (Calc): 117 mg/dL (calc) (ref <130)
TRIGLYCERIDES: 177 mg/dL — AB (ref <150)
Total CHOL/HDL Ratio: 3.7 (calc) (ref <5.0)

## 2018-11-19 LAB — COMPREHENSIVE METABOLIC PANEL
AG RATIO: 2 (calc) (ref 1.0–2.5)
ALT: 37 U/L (ref 9–46)
AST: 32 U/L (ref 10–35)
Albumin: 4.3 g/dL (ref 3.6–5.1)
Alkaline phosphatase (APISO): 58 U/L (ref 35–144)
BUN: 11 mg/dL (ref 7–25)
CO2: 28 mmol/L (ref 20–32)
CREATININE: 0.87 mg/dL (ref 0.70–1.18)
Calcium: 9.9 mg/dL (ref 8.6–10.3)
Chloride: 99 mmol/L (ref 98–110)
Globulin: 2.2 g/dL (calc) (ref 1.9–3.7)
Glucose, Bld: 104 mg/dL — ABNORMAL HIGH (ref 65–99)
Potassium: 4.6 mmol/L (ref 3.5–5.3)
Sodium: 137 mmol/L (ref 135–146)
Total Bilirubin: 0.5 mg/dL (ref 0.2–1.2)
Total Protein: 6.5 g/dL (ref 6.1–8.1)

## 2018-11-19 LAB — CBC WITH DIFFERENTIAL/PLATELET
Absolute Monocytes: 340 cells/uL (ref 200–950)
BASOS ABS: 69 {cells}/uL (ref 0–200)
BASOS PCT: 1.5 %
EOS PCT: 3.9 %
Eosinophils Absolute: 179 cells/uL (ref 15–500)
HCT: 37.2 % — ABNORMAL LOW (ref 38.5–50.0)
HEMOGLOBIN: 12.2 g/dL — AB (ref 13.2–17.1)
Lymphs Abs: 1288 cells/uL (ref 850–3900)
MCH: 28.2 pg (ref 27.0–33.0)
MCHC: 32.8 g/dL (ref 32.0–36.0)
MCV: 85.9 fL (ref 80.0–100.0)
MONOS PCT: 7.4 %
MPV: 10.4 fL (ref 7.5–12.5)
NEUTROS ABS: 2723 {cells}/uL (ref 1500–7800)
Neutrophils Relative %: 59.2 %
PLATELETS: 208 10*3/uL (ref 140–400)
RBC: 4.33 10*6/uL (ref 4.20–5.80)
RDW: 13.8 % (ref 11.0–15.0)
Total Lymphocyte: 28 %
WBC: 4.6 10*3/uL (ref 3.8–10.8)

## 2018-11-19 LAB — HEMOGLOBIN A1C
HEMOGLOBIN A1C: 6.8 %{Hb} — AB (ref <5.7)
MEAN PLASMA GLUCOSE: 148 (calc)
eAG (mmol/L): 8.2 (calc)

## 2018-11-19 LAB — TSH: TSH: 4.17 m[IU]/L (ref 0.40–4.50)

## 2018-11-23 ENCOUNTER — Ambulatory Visit: Payer: Medicare Other | Admitting: Family Medicine

## 2018-11-24 ENCOUNTER — Ambulatory Visit (INDEPENDENT_AMBULATORY_CARE_PROVIDER_SITE_OTHER): Payer: Medicare Other | Admitting: Family Medicine

## 2018-11-24 ENCOUNTER — Other Ambulatory Visit: Payer: Self-pay

## 2018-11-24 ENCOUNTER — Encounter: Payer: Self-pay | Admitting: Family Medicine

## 2018-11-24 VITALS — BP 122/68 | HR 68 | Temp 98.1°F | Resp 14 | Ht 59.0 in | Wt 196.7 lb

## 2018-11-24 DIAGNOSIS — F5104 Psychophysiologic insomnia: Secondary | ICD-10-CM | POA: Diagnosis not present

## 2018-11-24 DIAGNOSIS — I5032 Chronic diastolic (congestive) heart failure: Secondary | ICD-10-CM

## 2018-11-24 DIAGNOSIS — I890 Lymphedema, not elsewhere classified: Secondary | ICD-10-CM | POA: Diagnosis not present

## 2018-11-24 NOTE — Assessment & Plan Note (Signed)
Great weight loss with diuresis, take extra metalazone this Wed and continue Bumex He will use his lymphedema boots and wraps I will not place another unna boot today Discussed adherence to diet

## 2018-11-24 NOTE — Progress Notes (Signed)
   Subjective:    Patient ID: Michael [name], male    DOB: 1946-08-24, 73 y.o.   MRN: [id number]  Patient presents for Follow-up (edema)  Pt here for intermin f/u   Weight at last visit on 2/19 204lbs, Unna boots applied due to worsening lymphedema in setting of chronic diastolic heart failure. He admitted to dietary indiscretions but has been diligent about taking his medications  Unna boots removed at bedside, weight down 8lbs His legs feel much better, no SOB, no chest pain Reviewed labs at bedside for diabetes    Review Of Systems:  GEN- denies fatigue, fever, weight loss,weakness, recent illness HEENT- denies eye drainage, change in vision, nasal discharge, CVS- denies chest pain, palpitations RESP- denies SOB, cough, wheeze ABD- denies N/V, change in stools, abd pain GU- denies dysuria, hematuria, dribbling, incontinence MSK- +joint pain, muscle aches, injury Neuro- denies headache, dizziness, syncope, seizure activity       Objective:    BP 122/68   Pulse 68   Temp 98.1 F (36.7 C) (Oral)   Resp 14   Ht 4' 11"  (1.499 m)   Wt 196 lb 11.2 oz (89.2 kg)   SpO2 95%   BMI 39.73 kg/m  GEN- NAD, alert and oriented x3, in wheelchair  HEENT- PERRL, EOMI, non injected sclera, pink conjunctiva, MMM, oropharynx clear CVS- RRR, no murmur RESP-CTAB ABD-NABS,soft,NT, ND EXT- chronic lymphedema L >R , venous stasis changes - decreased swelling         Assessment & Plan:      Problem List Items Addressed This Visit      Unprioritized   Chronic diastolic CHF (congestive heart failure) (HCC) - Primary    Great weight loss with diuresis, take extra metalazone this Wed and continue Bumex He will use his lymphedema boots and wraps I will not place another unna boot today Discussed adherence to diet       Chronic insomnia   Lymphedema      Note: This dictation was prepared with Dragon dictation along with smaller phrase technology. Any transcriptional errors that  result from this process are unintentional.

## 2018-11-24 NOTE — Patient Instructions (Addendum)
F/U 4 months physical Take extra water pill- strong one on Wed Use your lymphedema pump once a day

## 2018-11-27 DIAGNOSIS — Z79899 Other long term (current) drug therapy: Secondary | ICD-10-CM | POA: Diagnosis not present

## 2018-11-27 DIAGNOSIS — I890 Lymphedema, not elsewhere classified: Secondary | ICD-10-CM | POA: Diagnosis not present

## 2018-11-27 DIAGNOSIS — G894 Chronic pain syndrome: Secondary | ICD-10-CM | POA: Diagnosis not present

## 2018-12-09 ENCOUNTER — Encounter: Payer: Self-pay | Admitting: Cardiology

## 2018-12-09 ENCOUNTER — Ambulatory Visit (INDEPENDENT_AMBULATORY_CARE_PROVIDER_SITE_OTHER): Payer: Medicare Other | Admitting: Cardiology

## 2018-12-09 ENCOUNTER — Other Ambulatory Visit: Payer: Self-pay

## 2018-12-09 VITALS — BP 118/64 | HR 87 | Ht 59.0 in | Wt 200.0 lb

## 2018-12-09 DIAGNOSIS — I890 Lymphedema, not elsewhere classified: Secondary | ICD-10-CM

## 2018-12-09 DIAGNOSIS — I5033 Acute on chronic diastolic (congestive) heart failure: Secondary | ICD-10-CM | POA: Diagnosis not present

## 2018-12-09 MED ORDER — METOLAZONE 2.5 MG PO TABS: ORAL_TABLET | ORAL | 3 refills | Status: DC

## 2018-12-09 NOTE — Progress Notes (Signed)
Clinical Summary Mr. Lomeli is a 73 y.o.male  seen today for follow up of the following medical problems.   1. Chronic diastolic heart failure/Lymphedema - echo 05/2015 with normal LVEF 60-65%, grade II diastolic dysfunction - Jan 2017 echo LVEF 69-45%, grade I diastolic dysfunction - edema likely combined diastolic HF and lymphedema   - bumex 59m bid, metolazone Tues and Thurs - Home weights at 200 lbs. He was 185 lbs at our last visit in 07/2018.  - labs 11/18/18 Cr 0.87 BUN 11   SH: veteran, [place] a [place]. S[place] in the [employer]  Past Medical History:  Diagnosis Date  . Arthritis   . Congestive heart disease ([place] 11/2015  . COPD (chronic obstructive pulmonary disease) ([place]   . DDD (degenerative disc disease), lumbosacral   . Depression   . Diabetic neuropathy ([place]   . Diastolic dysfunction   . Essential hypertension   . GERD (gastroesophageal reflux disease)   . Hyperlipidemia   . Iron deficiency anemia   . Left knee DJD   . Lumbar spinal stenosis   . Lymphedema 11/2015   BOTH LEGS  . NASH (nonalcoholic steatohepatitis)   . Peripheral edema   . Peripheral edema 10/05/2015  . Type 2 diabetes mellitus (HCC)      No Known Allergies   Current Outpatient Medications  Medication Sig Dispense Refill  . aspirin EC 81 MG tablet Take 81 mg by mouth daily.    . B-D ULTRAFINE III SHORT PEN 31G X 8 MM MISC USE AS DIRECTED DAILY WITH LANTUS. 100 each 0  . benazepril (LOTENSIN) 40 MG tablet TAKE ONE TABLET BY MOUTH DAILY. 90 tablet 0  . Blood Glucose Monitoring Suppl (BLOOD GLUCOSE SYSTEM PAK) KIT Please dispense based on patient and insurance preference. Use as directed to monitor FSBS 2x daily. Dx: E11.9 1 each 1  . bumetanide (BUMEX) 2 MG tablet TAKE 1&1/2 TABLETS BY MOUTH TWICE DAILY. (Patient taking differently: TAKE 2 MG (1 TABLET) TABLETS BY MOUTH TWICE DAILY.) 135 tablet 3  . carvedilol (COREG) 3.125 MG tablet TAKE 1 TABLET BY MOUTH TWO TIMES DAILY WITH  A MEAL. 60 tablet 0  . diclofenac sodium (VOLTAREN) 1 % GEL Apply 4 g topically 4 (four) times daily as needed (pain). 100 g 3  . Ferrous Sulfate (IRON) 325 (65 FE) MG TABS Take 1 tablet by mouth daily.     .Marland Kitchengabapentin (NEURONTIN) 400 MG capsule Take 1 capsule (400 mg total) by mouth 3 (three) times daily. 90 capsule 0  . Glucose Blood (BLOOD GLUCOSE TEST STRIPS) STRP Please dispense based on patient and insurance preference. Use as directed to monitor FSBS 2x daily. Dx: E11.9 100 each 1  . Insulin Glargine (LANTUS SOLOSTAR) 100 UNIT/ML Solostar Pen Inject 5 Units into the skin daily at 10 pm. 15 mL 0  . Lancets MISC Please dispense based on patient and insurance preference. Use as directed to monitor FSBS 2x daily. Dx: E11.9 100 each 1  . levothyroxine (SYNTHROID, LEVOTHROID) 100 MCG tablet TAKE 1 TABLET BY MOUTH DAILY BEFORE BREAKFAST. 30 tablet 0  . magnesium oxide (MAG-OX) 400 MG tablet Take 1 tablet (400 mg total) by mouth 2 (two) times daily. 60 tablet 11  . metFORMIN (GLUCOPHAGE) 1000 MG tablet TAKE ONE TABLET BY MOUTH TWICE DAILY WITH A MEAL. 180 tablet 3  . metolazone (ZAROXOLYN) 2.5 MG tablet TAKE EVERY Tuesday AND Thursday (Patient taking differently: TAKE EVERY Monday AND Friday) 45 tablet  3  . nystatin (MYCOSTATIN/NYSTOP) powder Apply topically 4 (four) times daily. 15 g 0  . nystatin cream (MYCOSTATIN) APPLY TO AFFECTED AREA TWICE DAILY. 30 g 0  . oxyCODONE-acetaminophen (PERCOCET) 7.5-325 MG tablet TAKE 1 TABLET BY MOUTH EVERY 6 HOURS AS NEEDED FOR SEVERE PAIN. 120 tablet 0  . pantoprazole (PROTONIX) 40 MG tablet Take 1 tablet (40 mg total) by mouth daily. 30 tablet 11  . potassium chloride (K-DUR) 10 MEQ tablet Take 1 tablet (10 mEq total) by mouth 2 (two) times daily. 180 tablet 3  . pravastatin (PRAVACHOL) 40 MG tablet TAKE ONE TABLET BY MOUTH DAILY. (Patient taking differently: TAKE ONE TABLET BY MOUTH DAILY IN THE EVENING) 90 tablet 0  . traZODone (DESYREL) 100 MG tablet TAKE 1  TABLET BY MOUTH AT BEDTIME AS NEEDED FOR SLEEP. 90 tablet 2  . triamcinolone cream (KENALOG) 0.1 % Apply 1 application topically 2 (two) times daily. To arms/hand 30 g 0   No current facility-administered medications for this visit.      Past Surgical History:  Procedure Laterality Date  . BIOPSY N/A 07/21/2014   Procedure: BIOPSY;  Surgeon: Daneil Dolin, MD;  Location: [facility];  Service: Endoscopy;  Laterality: N/A;  . CATARACT EXTRACTION W/PHACO Right 07/08/2016   Procedure: CATARACT EXTRACTION PHACO AND INTRAOCULAR LENS PLACEMENT RIGHT EYE;  Surgeon: Tonny Branch, MD;  Location: [facility];  Service: Ophthalmology;  Laterality: Right;  CDE: 9.01  . CATARACT EXTRACTION W/PHACO Left 07/22/2016   Procedure: CATARACT EXTRACTION PHACO AND INTRAOCULAR LENS PLACEMENT LEFT EYE CDE=10.69;  Surgeon: Tonny Branch, MD;  Location: [facility];  Service: Ophthalmology;  Laterality: Left;  left -   . COLONOSCOPY  2011   [facility]: normal colon, normal distal ileum  . COLONOSCOPY WITH PROPOFOL N/A 07/21/2014   Procedure: ATTEMPTED COLONOSCOPY WITH PROPOFOL-HAD TO STOP DUE TO BRADYCARDIA;  Surgeon: Daneil Dolin, MD;  Location: [facility];  Service: Endoscopy;  Laterality: N/A;  . EGD with enteroscopy  2011   [facility]: normal esophagus and stomach. Normal duodenum, jejunum. No evidence of AVMs.   . ESOPHAGOGASTRODUODENOSCOPY (EGD) WITH PROPOFOL N/A 07/21/2014   Procedure: ESOPHAGOGASTRODUODENOSCOPY (EGD) WITH PROPOFOL;  Surgeon: Daneil Dolin, MD;  Location: [facility];  Service: Endoscopy;  Laterality: N/A;  . HERNIA REPAIR    . KNEE ARTHROSCOPY WITH MEDIAL MENISECTOMY Left 11/06/2012   Procedure: KNEE ARTHROSCOPY WITH MEDIAL MENISECTOMY;  Surgeon: Carole Civil, MD;  Location: [facility];  Service: Orthopedics;  Laterality: Left;  . Lipoma removal     Stomach  . LUMBAR LAMINECTOMY/DECOMPRESSION MICRODISCECTOMY Left 08/30/2013   Procedure: LUMBAR LAMINECTOMY/DECOMPRESSION MICRODISCECTOMY LEFT  LUMBAR TWO THREE;   Surgeon: Otilio Connors, MD;  Location: [place] [facility];  Service: Neurosurgery;  Laterality: Left;  . SHOULDER SURGERY     Rght-rotator cuff  . TOOTH EXTRACTION       No Known Allergies    Family History  Problem Relation Age of Onset  . Heart disease Mother   . Hyperlipidemia Mother   . Hypertension Mother   . Depression Mother   . Diabetes Mother   . Rectal cancer Mother   . Cancer Mother   . Heart disease Father   . Hypertension Father   . Hyperlipidemia Father   . Diabetes Father   . Cancer Father   . Heart disease Sister   . Hyperlipidemia Sister   . Hypertension Sister   . Diabetes Sister   . Diabetes Brother   . Heart disease  Sister   . Hyperlipidemia Sister   . Hypertension Sister   . Heart disease Sister   . Hyperlipidemia Sister   . Hypertension Sister   . Diabetes Sister   . Diabetes Brother      Social History Mr. Paulsen reports that he quit smoking about 19 years ago. His smoking use included cigarettes. He started smoking about 63 years ago. He has a 110.00 pack-year smoking history. He has never used smokeless tobacco. Mr. Vandervelden reports no history of alcohol use.   Review of Systems CONSTITUTIONAL: No weight loss, fever, chills, weakness or fatigue.  HEENT: Eyes: No visual loss, blurred vision, double vision or yellow sclerae.No hearing loss, sneezing, congestion, runny nose or sore throat.  SKIN: No rash or itching.  CARDIOVASCULAR: no chest pain, no palpitations.  RESPIRATORY: No shortness of breath, cough or sputum.  GASTROINTESTINAL: No anorexia, nausea, vomiting or diarrhea. No abdominal pain or blood.  GENITOURINARY: No burning on urination, no polyuria NEUROLOGICAL: No headache, dizziness, syncope, paralysis, ataxia, numbness or tingling in the extremities. No change in bowel or bladder control.  MUSCULOSKELETAL: No muscle, back pain, joint pain or stiffness.  LYMPHATICS: No enlarged nodes. No history of splenectomy.  PSYCHIATRIC: No  history of depression or anxiety.  ENDOCRINOLOGIC: No reports of sweating, cold or heat intolerance. No polyuria or polydipsia.  Marland Kitchen   Physical Examination Today's Vitals   12/09/18 0849  BP: 118/64  Pulse: 87  SpO2: 98%  Weight: 200 lb (90.7 kg)  Height: 4' 11"  (1.499 m)   Body mass index is 40.4 kg/m.  Gen: resting comfortably, no acute distress HEENT: no scleral icterus, pupils equal round and reactive, no palptable cervical adenopathy,  CV: RRR, no mrg, no jvd Resp: Clear to auscultation bilaterally GI: abdomen is soft, non-tender, non-distended, normal bowel sounds, no hepatosplenomegaly MSK: extremities are warm, 2+ bilateral LE edema Skin: warm, no rash Neuro:  no focal deficits Psych: appropriate affect    Assessment and Plan   1. Acute on chronic diastolic HF/Lymphedema - increased LE edema and weights, at our visit 07/2018 he was 185 lbs, today he is 200 lbs - we will increase his metolazone to 2.43m on Tues, Thur, Sat. Check BMET/Mg in 2 weeks - f/u 1 month. He is asked to call [place] weights get to 190 lbs.    JArnoldo Lenis M.D.

## 2018-12-09 NOTE — Patient Instructions (Signed)
Medication Instructions:  TAKE METOLAZONE 2.5 ON Tuesday, Thursday & Saturday   Labwork: 2 WEEKS   BMET MAGNESIUM  Testing/Procedures: NONE  Follow-Up: Your physician recommends that you schedule a follow-up appointment in: 1 MONTH   Any Other Special Instructions Will Be Listed Below (If Applicable).     If you need a refill on your cardiac medications before your next appointment, please call your pharmacy.

## 2018-12-30 ENCOUNTER — Telehealth: Payer: Self-pay | Admitting: Cardiology

## 2018-12-30 MED ORDER — CARVEDILOL 3.125 MG PO TABS: ORAL_TABLET | ORAL | 0 refills | Status: DC

## 2018-12-30 NOTE — Telephone Encounter (Signed)
[facility] / tg

## 2018-12-30 NOTE — Telephone Encounter (Signed)
Refill complete 

## 2019-01-25 DIAGNOSIS — Z79899 Other long term (current) drug therapy: Secondary | ICD-10-CM | POA: Diagnosis not present

## 2019-01-25 DIAGNOSIS — G894 Chronic pain syndrome: Secondary | ICD-10-CM | POA: Diagnosis not present

## 2019-01-25 DIAGNOSIS — I890 Lymphedema, not elsewhere classified: Secondary | ICD-10-CM | POA: Diagnosis not present

## 2019-01-29 ENCOUNTER — Ambulatory Visit: Payer: Medicare Other | Admitting: Cardiology

## 2019-03-01 ENCOUNTER — Telehealth: Payer: Self-pay | Admitting: Cardiology

## 2019-03-01 NOTE — Telephone Encounter (Signed)
[facility] is calling in regards to 5 lb weight gain over 5 days. Please call Clarise Cruz with instructions. / tg

## 2019-03-01 NOTE — Telephone Encounter (Signed)
Clarify how he is taking his bumez and metolazone. Did he misses any doses recently? Has he increased his sodium intake? If taking diuretics as prescribed, including his metolazone Tues, Thur, Sat, I would have him take a dose of the 2.44m of metolazone today and on Wed in addition to his regular 3 day a week schedule   JZandra AbtsMD

## 2019-03-01 NOTE — Telephone Encounter (Signed)
Patient missed Saturday's pm dose Bumex -he fell asleep, he also salted his food twice over the weekend (he did not tell home health ). He will take the two extra doses of metolazone this week. We talked about salt restriction, said he was acting like a 73 yr old

## 2019-03-01 NOTE — Telephone Encounter (Signed)
Pt wt last Thursday was 201 lbs, today is 206 lbs, patient does not wear compression stockings and is not doing lymphedema treatment, took off una boots

## 2019-03-03 ENCOUNTER — Ambulatory Visit: Payer: Medicare Other | Admitting: Cardiology

## 2019-03-25 DIAGNOSIS — I890 Lymphedema, not elsewhere classified: Secondary | ICD-10-CM | POA: Diagnosis not present

## 2019-03-25 DIAGNOSIS — G894 Chronic pain syndrome: Secondary | ICD-10-CM | POA: Diagnosis not present

## 2019-03-25 DIAGNOSIS — Z79899 Other long term (current) drug therapy: Secondary | ICD-10-CM | POA: Diagnosis not present

## 2019-03-29 ENCOUNTER — Encounter: Payer: Medicare Other | Admitting: Family Medicine

## 2019-04-14 ENCOUNTER — Telehealth: Payer: Self-pay

## 2019-04-14 ENCOUNTER — Other Ambulatory Visit (HOSPITAL_COMMUNITY)
Admission: RE | Admit: 2019-04-14 | Discharge: 2019-04-14 | Disposition: A | Discharge status: Home / Self Care | Payer: Medicare Other | Source: Ambulatory Visit | Attending: Cardiology | Admitting: Cardiology

## 2019-04-14 DIAGNOSIS — I5033 Acute on chronic diastolic (congestive) heart failure: Secondary | ICD-10-CM | POA: Diagnosis not present

## 2019-04-14 LAB — BASIC METABOLIC PANEL
Anion gap: 10 (ref 5–15)
BUN: 10 mg/dL (ref 8–23)
CO2: 29 mmol/L (ref 22–32)
Calcium: 9.5 mg/dL (ref 8.9–10.3)
Chloride: 98 mmol/L (ref 98–111)
Creatinine, Ser: 0.79 mg/dL (ref 0.61–1.24)
GFR calc Af Amer: >60 mL/min (ref >60)
GFR calc non Af Amer: >60 mL/min (ref >60)
Glucose, Bld: 105 mg/dL — ABNORMAL HIGH (ref 70–99)
Potassium: 4.6 mmol/L (ref 3.5–5.1)
Sodium: 137 mmol/L (ref 135–145)

## 2019-04-14 LAB — MAGNESIUM: Magnesium: 1.6 mg/dL — ABNORMAL LOW (ref 1.7–2.4)

## 2019-04-14 NOTE — Telephone Encounter (Signed)
-----   Message from [name], MD sent at 04/14/2019 12:49 PM EDT ----- Labs look good   [name] MD

## 2019-04-14 NOTE — Telephone Encounter (Signed)
Called pt, No answer. Left message for pt to return call.

## 2019-04-19 ENCOUNTER — Telehealth: Payer: Self-pay | Admitting: *Deleted

## 2019-04-19 NOTE — Telephone Encounter (Signed)
Received call from patient.   Requested order for new wheelchair to be faxed to [facility].   New OV required for documentation purposes.   Call placed to patient. No answer. No VM.

## 2019-04-20 NOTE — Telephone Encounter (Signed)
Patient returned call and made aware.   States that insurance reports he can do a telemedicine visit since he has  Current order for [facility] that has expired.   Appointment scheduled.

## 2019-04-20 NOTE — Telephone Encounter (Signed)
noted 

## 2019-04-23 ENCOUNTER — Encounter: Payer: Self-pay | Admitting: Family Medicine

## 2019-04-23 ENCOUNTER — Other Ambulatory Visit: Payer: Self-pay

## 2019-04-23 ENCOUNTER — Ambulatory Visit (INDEPENDENT_AMBULATORY_CARE_PROVIDER_SITE_OTHER): Payer: Medicare Other | Admitting: Family Medicine

## 2019-04-23 DIAGNOSIS — R531 Weakness: Secondary | ICD-10-CM

## 2019-04-23 DIAGNOSIS — R262 Difficulty in walking, not elsewhere classified: Secondary | ICD-10-CM | POA: Diagnosis not present

## 2019-04-23 DIAGNOSIS — I890 Lymphedema, not elsewhere classified: Secondary | ICD-10-CM | POA: Diagnosis not present

## 2019-04-23 NOTE — Progress Notes (Signed)
Virtual Visit via Telephone Note  I connected with Michael [name] on 04/23/19 at 1:40pm by telephone and verified that I am speaking with the correct person using two identifiers.   Pt location: at home   Physician location:  In office, [facility], Vic Blackbird MD     On call: patient and physician    I discussed the limitations, risks, security and privacy concerns of performing an evaluation and management service by telephone and the availability of in person appointments. I also discussed with the patient that there may be a patient responsible charge related to this service. The patient expressed understanding and agreed to proceed.   History of Present Illness:  Telephone visit in setting of COVID-19 and patient high risk  Pt needs a new wheelchair, the arms pads are cracked and now wrapped with duct tape, the brakes are not working on the chair. He has chronic lymphedema, gait instability, chronic knee pain Pt currently has regular size 18x16x18   needs sent to [facility]   Seen at [facility] clinic - podiatry this morning , given erythema and warmth and extra swelling he was given antibiotics today   For chronic lymphedema, CHF- had cardiology appt on March- taking three times a week , he had BMET last week which was normal Cr 0.79    Observations/Objective: Unable to observe, NAD on phone   Assessment and Plan:  Chronic lymphedema- the nurses recently stopped wrapping legs, has had increased edema, increased metalazone per cardiology, take antibiotics for probable cellulitis per podiatry visit today. If he has questions on the medication he will call us  Gait instability/generalized weakness- pt would benefit from wheelchair, he is able to self propel chair with upper body without difficulty. Wheelchair will keep him mobile within his home which is wheelchair accessible, This will also allow him to independence with ADL's     Follow Up Instructions: F/U  as previously scheduled     I discussed the assessment and treatment plan with the patient. The patient was provided an opportunity to ask questions and all were answered. The patient agreed with the plan and demonstrated an understanding of the instructions.   The patient was advised to call back or seek an in-person evaluation if the symptoms worsen or if the condition fails to improve as anticipated.  I provided 15 minutes of non-face-to-face time during this encounter. End Time 1:55pm  Vic Blackbird, MD

## 2019-04-28 DIAGNOSIS — I890 Lymphedema, not elsewhere classified: Secondary | ICD-10-CM | POA: Diagnosis not present

## 2019-04-28 DIAGNOSIS — I110 Hypertensive heart disease with heart failure: Secondary | ICD-10-CM | POA: Diagnosis not present

## 2019-04-28 DIAGNOSIS — R531 Weakness: Secondary | ICD-10-CM | POA: Diagnosis not present

## 2019-04-28 DIAGNOSIS — R262 Difficulty in walking, not elsewhere classified: Secondary | ICD-10-CM | POA: Diagnosis not present

## 2019-04-28 DIAGNOSIS — I5032 Chronic diastolic (congestive) heart failure: Secondary | ICD-10-CM | POA: Diagnosis not present

## 2019-05-05 ENCOUNTER — Ambulatory Visit: Payer: Medicare Other | Admitting: Cardiology

## 2019-05-25 DIAGNOSIS — Z760 Encounter for issue of repeat prescription: Secondary | ICD-10-CM | POA: Diagnosis not present

## 2019-05-25 DIAGNOSIS — Z79899 Other long term (current) drug therapy: Secondary | ICD-10-CM | POA: Diagnosis not present

## 2019-05-25 DIAGNOSIS — I890 Lymphedema, not elsewhere classified: Secondary | ICD-10-CM | POA: Diagnosis not present

## 2019-05-25 DIAGNOSIS — G894 Chronic pain syndrome: Secondary | ICD-10-CM | POA: Diagnosis not present

## 2019-05-29 DIAGNOSIS — I890 Lymphedema, not elsewhere classified: Secondary | ICD-10-CM | POA: Diagnosis not present

## 2019-05-29 DIAGNOSIS — R262 Difficulty in walking, not elsewhere classified: Secondary | ICD-10-CM | POA: Diagnosis not present

## 2019-05-29 DIAGNOSIS — I5032 Chronic diastolic (congestive) heart failure: Secondary | ICD-10-CM | POA: Diagnosis not present

## 2019-05-29 DIAGNOSIS — R531 Weakness: Secondary | ICD-10-CM | POA: Diagnosis not present

## 2019-06-15 ENCOUNTER — Encounter: Payer: Self-pay | Admitting: Cardiology

## 2019-06-15 ENCOUNTER — Other Ambulatory Visit: Payer: Self-pay

## 2019-06-15 ENCOUNTER — Ambulatory Visit (INDEPENDENT_AMBULATORY_CARE_PROVIDER_SITE_OTHER): Payer: Medicare Other | Admitting: Cardiology

## 2019-06-15 VITALS — BP 160/82 | HR 95 | Temp 96.9°F | Ht 59.0 in | Wt 206.0 lb

## 2019-06-15 DIAGNOSIS — I890 Lymphedema, not elsewhere classified: Secondary | ICD-10-CM

## 2019-06-15 DIAGNOSIS — I10 Essential (primary) hypertension: Secondary | ICD-10-CM | POA: Diagnosis not present

## 2019-06-15 DIAGNOSIS — I5033 Acute on chronic diastolic (congestive) heart failure: Secondary | ICD-10-CM

## 2019-06-15 NOTE — Progress Notes (Signed)
Clinical Summary Michael [name] is a 73 y.o.male seen today for follow up of the following medical problems.   1. Chronic diastolic heart failure/Lymphedema - echo 05/2015 with normal LVEF 60-65%, grade II diastolic dysfunction - Jan 2017 echo LVEF 64-15%, grade I diastolic dysfunction - edema likely combined diastolic HF and lymphedema   -He was 185 lbs at our last visit in 07/2018. Trended up to 196 and then 200 lbs in 11/2018. Up to 206 lbs today.   - he has been taking his bumex bid, only taking his metolazone prn. From my last note he was to take tid, but he reports at some point he was told to take only prn.  - no recent SOB or DOE  SH: veteran, he is a [place] vet.Served in the [employer]  Sister is Michael [name] also a patient of mine, recently passed away around labor day 11-29-2018 from pancreatic cancer Past Medical History:  Diagnosis Date  . Arthritis   . Congestive heart disease ([place]) 11/2015  . COPD (chronic obstructive pulmonary disease) ([place])   . DDD (degenerative disc disease), lumbosacral   . Depression   . Diabetic neuropathy ([place])   . Diastolic dysfunction   . Essential hypertension   . GERD (gastroesophageal reflux disease)   . Hyperlipidemia   . Iron deficiency anemia   . Left knee DJD   . Lumbar spinal stenosis   . Lymphedema 11/30/15   BOTH LEGS  . NASH (nonalcoholic steatohepatitis)   . Peripheral edema   . Peripheral edema 10/05/2015  . Type 2 diabetes mellitus (H[facility])      No Known Allergies   Current Outpatient Medications  Medication Sig Dispense Refill  . aspirin EC 81 MG tablet Take 81 mg by mouth daily.    . B-D ULTRAFINE III SHORT PEN 31G X 8 MM MISC USE AS DIRECTED DAILY WITH LANTUS. 100 each 0  . benazepril (LOTENSIN) 40 MG tablet TAKE ONE TABLET BY MOUTH DAILY. 90 tablet 0  . Blood Glucose Monitoring Suppl (BLOOD GLUCOSE SYSTEM PAK) KIT Please dispense based on patient and insurance preference. Use as directed to monitor FSBS 2x daily. Dx:  E11.9 1 each 1  . bumetanide (BUMEX) 2 MG tablet TAKE 1&1/2 TABLETS BY MOUTH TWICE DAILY. (Patient taking differently: TAKE 2 MG (1 TABLET) TABLETS BY MOUTH TWICE DAILY.) 135 tablet 3  . carvedilol (COREG) 3.125 MG tablet TAKE 1 TABLET BY MOUTH TWO TIMES DAILY WITH A MEAL. 60 tablet 0  . diclofenac sodium (VOLTAREN) 1 % GEL Apply 4 g topically 4 (four) times daily as needed (pain). 100 g 3  . Ferrous Sulfate (IRON) 325 (65 FE) MG TABS Take 1 tablet by mouth daily.     Marland Kitchen gabapentin (NEURONTIN) 400 MG capsule Take 1 capsule (400 mg total) by mouth 3 (three) times daily. 90 capsule 0  . Glucose Blood (BLOOD GLUCOSE TEST STRIPS) STRP Please dispense based on patient and insurance preference. Use as directed to monitor FSBS 2x daily. Dx: E11.9 100 each 1  . Insulin Glargine (LANTUS SOLOSTAR) 100 UNIT/ML Solostar Pen Inject 5 Units into the skin daily at 10 pm. 15 mL 0  . Lancets MISC Please dispense based on patient and insurance preference. Use as directed to monitor FSBS 2x daily. Dx: E11.9 100 each 1  . levothyroxine (SYNTHROID, LEVOTHROID) 100 MCG tablet TAKE 1 TABLET BY MOUTH DAILY BEFORE BREAKFAST. 30 tablet 0  . magnesium oxide (MAG-OX) 400 MG tablet Take 1 tablet (400 mg  total) by mouth 2 (two) times daily. 60 tablet 11  . metFORMIN (GLUCOPHAGE) 1000 MG tablet TAKE ONE TABLET BY MOUTH TWICE DAILY WITH A MEAL. 180 tablet 3  . metolazone (ZAROXOLYN) 2.5 MG tablet TAKE EVERY Tuesday, Thursday & Saturday. 90 tablet 3  . nystatin (MYCOSTATIN/NYSTOP) powder Apply topically 4 (four) times daily. 15 g 0  . nystatin cream (MYCOSTATIN) APPLY TO AFFECTED AREA TWICE DAILY. 30 g 0  . oxyCODONE-acetaminophen (PERCOCET) 7.5-325 MG tablet TAKE 1 TABLET BY MOUTH EVERY 6 HOURS AS NEEDED FOR SEVERE PAIN. 120 tablet 0  . pantoprazole (PROTONIX) 40 MG tablet Take 1 tablet (40 mg total) by mouth daily. 30 tablet 11  . potassium chloride (K-DUR) 10 MEQ tablet Take 1 tablet (10 mEq total) by mouth 2 (two) times daily.  180 tablet 3  . pravastatin (PRAVACHOL) 40 MG tablet TAKE ONE TABLET BY MOUTH DAILY. (Patient taking differently: TAKE ONE TABLET BY MOUTH DAILY IN THE EVENING) 90 tablet 0  . traZODone (DESYREL) 100 MG tablet TAKE 1 TABLET BY MOUTH AT BEDTIME AS NEEDED FOR SLEEP. 90 tablet 2  . triamcinolone cream (KENALOG) 0.1 % Apply 1 application topically 2 (two) times daily. To arms/hand 30 g 0   No current facility-administered medications for this visit.      Past Surgical History:  Procedure Laterality Date  . BIOPSY N/A 07/21/2014   Procedure: BIOPSY;  Surgeon: Daneil Dolin, MD;  Location: [facility];  Service: Endoscopy;  Laterality: N/A;  . CATARACT EXTRACTION W/PHACO Right 07/08/2016   Procedure: CATARACT EXTRACTION PHACO AND INTRAOCULAR LENS PLACEMENT RIGHT EYE;  Surgeon: Tonny , MD;  Location: [facility];  Service: Ophthalmology;  Laterality: Right;  CDE: 9.01  . CATARACT EXTRACTION W/PHACO Left 07/22/2016   Procedure: CATARACT EXTRACTION PHACO AND INTRAOCULAR LENS PLACEMENT LEFT EYE CDE=10.69;  Surgeon: Tonny , MD;  Location: [facility];  Service: Ophthalmology;  Laterality: Left;  left -   . COLONOSCOPY  2011   [facility]: normal colon, normal distal ileum  . COLONOSCOPY WITH PROPOFOL N/A 07/21/2014   Procedure: ATTEMPTED COLONOSCOPY WITH PROPOFOL-HAD TO STOP DUE TO BRADYCARDIA;  Surgeon: Daneil Dolin, MD;  Location: [facility];  Service: Endoscopy;  Laterality: N/A;  . EGD with enteroscopy  2011   [facility]: normal esophagus and stomach. Normal duodenum, jejunum. No evidence of AVMs.   . ESOPHAGOGASTRODUODENOSCOPY (EGD) WITH PROPOFOL N/A 07/21/2014   Procedure: ESOPHAGOGASTRODUODENOSCOPY (EGD) WITH PROPOFOL;  Surgeon: Daneil Dolin, MD;  Location: [facility];  Service: Endoscopy;  Laterality: N/A;  . HERNIA REPAIR    . KNEE ARTHROSCOPY WITH MEDIAL MENISECTOMY Left 11/06/2012   Procedure: KNEE ARTHROSCOPY WITH MEDIAL MENISECTOMY;  Surgeon: Carole Civil, MD;  Location: [facility];  Service:  Orthopedics;  Laterality: Left;  . Lipoma removal     Stomach  . LUMBAR LAMINECTOMY/DECOMPRESSION MICRODISCECTOMY Left 08/30/2013   Procedure: LUMBAR LAMINECTOMY/DECOMPRESSION MICRODISCECTOMY LEFT  LUMBAR TWO THREE;  Surgeon: Otilio Connors, MD;  Location: [facility];  Service: Neurosurgery;  Laterality: Left;  . SHOULDER SURGERY     Rght-rotator cuff  . TOOTH EXTRACTION       No Known Allergies    Family History  Problem Relation Age of Onset  . Heart disease Mother   . Hyperlipidemia Mother   . Hypertension Mother   . Depression Mother   . Diabetes Mother   . Rectal cancer Mother   . Cancer Mother   . Heart disease Father   . Hypertension Father   .  Hyperlipidemia Father   . Diabetes Father   . Cancer Father   . Heart disease Sister   . Hyperlipidemia Sister   . Hypertension Sister   . Diabetes Sister   . Diabetes Brother   . Heart disease Sister   . Hyperlipidemia Sister   . Hypertension Sister   . Heart disease Sister   . Hyperlipidemia Sister   . Hypertension Sister   . Diabetes Sister   . Diabetes Brother      Social History Michael [name] reports that he quit smoking about 19 years ago. His smoking use included cigarettes. He started smoking about 64 years ago. He has a 110.00 pack-year smoking history. He has never used smokeless tobacco. Michael [name] reports no history of alcohol use.   Review of Systems CONSTITUTIONAL: No weight loss, fever, chills, weakness or fatigue.  HEENT: Eyes: No visual loss, blurred vision, double vision or yellow sclerae.No hearing loss, sneezing, congestion, runny nose or sore throat.  SKIN: No rash or itching.  CARDIOVASCULAR: per hpi RESPIRATORY: No shortness of breath, cough or sputum.  GASTROINTESTINAL: No anorexia, nausea, vomiting or diarrhea. No abdominal pain or blood.  GENITOURINARY: No burning on urination, no polyuria NEUROLOGICAL: No headache, dizziness, syncope, paralysis, ataxia, numbness or tingling in the  extremities. No change in bowel or bladder control.  MUSCULOSKELETAL: No muscle, back pain, joint pain or stiffness.  LYMPHATICS: No enlarged nodes. No history of splenectomy.  PSYCHIATRIC: No history of depression or anxiety.  ENDOCRINOLOGIC: No reports of sweating, cold or heat intolerance. No polyuria or polydipsia.  Marland Kitchen   Physical Examination Today's Vitals   06/15/19 0847  BP: (!) 160/82  Pulse: 95  Temp: (!) 96.9 F (36.1 C)  SpO2: 96%  Weight: 206 lb (93.4 kg)  Height: _0  (1.499 m)   Body mass index is 41.61 kg/m.  Gen: resting comfortably, no acute distress HEENT: no scleral icterus, pupils equal round and reactive, no palptable cervical adenopathy,  CV: RRR, no m/r/g, no jvd Resp: Clear to auscultation bilaterally GI: abdomen is soft, non-tender, non-distended, normal bowel sounds, no hepatosplenomegaly MSK: extremities are warm,2+ bilateral LE edema Skin: warm, no rash Neuro:  no focal deficits Psych: appropriate affect   Diagnostic Studies     Assessment and Plan  1.Acute on chronic diastolic HF/Lymphedema - weight up to 206 lbs, back in 07/2018 he was all the way down to 185 lbs - increase metloazone to 2.65m 3 times a week, he will call Monday to update [place] weights. Will need labs in the next few weeks pending weight trend   2. HTN - above goal but has not taken meds yet, also fluid overloaded - continue to monitor   JArnoldo Lenis M.D.

## 2019-06-15 NOTE — Patient Instructions (Addendum)
Your physician recommends that you schedule a follow-up appointment in: [place] has recommended you make the following change in your medication:   RESUME TAKING METOLAZONE 2.5 MG EVERY Tuesday, Thursday, AND Saturday   PLEASE UPDATE [place] ON YOUR WEIGHTS ON Monday  Thank you for choosing [facility]!!

## 2019-06-21 ENCOUNTER — Telehealth: Payer: Self-pay | Admitting: Cardiology

## 2019-06-21 DIAGNOSIS — I5033 Acute on chronic diastolic (congestive) heart failure: Secondary | ICD-10-CM

## 2019-06-21 NOTE — Telephone Encounter (Signed)
Patient calling to report weights per Dr.Branch  9/15 206 lbs 9/17 203 lbs 9/19 201 lbs 9/21 200 lbs

## 2019-06-22 NOTE — Telephone Encounter (Signed)
Weights moving in the right direction, can continue current regimen. Needs BMET/Mg at the end of the week   [name] MD

## 2019-06-23 NOTE — Telephone Encounter (Signed)
Labs ordered. Pt made aware.

## 2019-06-29 DIAGNOSIS — I5032 Chronic diastolic (congestive) heart failure: Secondary | ICD-10-CM | POA: Diagnosis not present

## 2019-06-29 DIAGNOSIS — R531 Weakness: Secondary | ICD-10-CM | POA: Diagnosis not present

## 2019-06-29 DIAGNOSIS — R262 Difficulty in walking, not elsewhere classified: Secondary | ICD-10-CM | POA: Diagnosis not present

## 2019-06-29 DIAGNOSIS — I890 Lymphedema, not elsewhere classified: Secondary | ICD-10-CM | POA: Diagnosis not present

## 2019-07-23 DIAGNOSIS — G894 Chronic pain syndrome: Secondary | ICD-10-CM | POA: Diagnosis not present

## 2019-07-23 DIAGNOSIS — Z79899 Other long term (current) drug therapy: Secondary | ICD-10-CM | POA: Diagnosis not present

## 2019-07-23 DIAGNOSIS — I890 Lymphedema, not elsewhere classified: Secondary | ICD-10-CM | POA: Diagnosis not present

## 2019-07-29 DIAGNOSIS — R262 Difficulty in walking, not elsewhere classified: Secondary | ICD-10-CM | POA: Diagnosis not present

## 2019-07-29 DIAGNOSIS — R531 Weakness: Secondary | ICD-10-CM | POA: Diagnosis not present

## 2019-07-29 DIAGNOSIS — I5032 Chronic diastolic (congestive) heart failure: Secondary | ICD-10-CM | POA: Diagnosis not present

## 2019-07-29 DIAGNOSIS — I890 Lymphedema, not elsewhere classified: Secondary | ICD-10-CM | POA: Diagnosis not present

## 2019-08-03 ENCOUNTER — Encounter: Payer: Self-pay | Admitting: Family Medicine

## 2019-08-03 ENCOUNTER — Ambulatory Visit (INDEPENDENT_AMBULATORY_CARE_PROVIDER_SITE_OTHER): Payer: Medicare Other | Admitting: Family Medicine

## 2019-08-03 ENCOUNTER — Other Ambulatory Visit: Payer: Self-pay

## 2019-08-03 VITALS — BP 184/96 | HR 93 | Temp 99.3°F | Resp 18 | Ht 59.0 in | Wt 205.6 lb

## 2019-08-03 DIAGNOSIS — I890 Lymphedema, not elsewhere classified: Secondary | ICD-10-CM

## 2019-08-03 DIAGNOSIS — I5032 Chronic diastolic (congestive) heart failure: Secondary | ICD-10-CM

## 2019-08-03 DIAGNOSIS — Z125 Encounter for screening for malignant neoplasm of prostate: Secondary | ICD-10-CM

## 2019-08-03 DIAGNOSIS — E782 Mixed hyperlipidemia: Secondary | ICD-10-CM

## 2019-08-03 DIAGNOSIS — Z0000 Encounter for general adult medical examination without abnormal findings: Secondary | ICD-10-CM

## 2019-08-03 DIAGNOSIS — K7581 Nonalcoholic steatohepatitis (NASH): Secondary | ICD-10-CM | POA: Diagnosis not present

## 2019-08-03 DIAGNOSIS — E038 Other specified hypothyroidism: Secondary | ICD-10-CM

## 2019-08-03 DIAGNOSIS — F5104 Psychophysiologic insomnia: Secondary | ICD-10-CM

## 2019-08-03 DIAGNOSIS — E1149 Type 2 diabetes mellitus with other diabetic neurological complication: Secondary | ICD-10-CM | POA: Diagnosis not present

## 2019-08-03 DIAGNOSIS — G622 Polyneuropathy due to other toxic agents: Secondary | ICD-10-CM

## 2019-08-03 DIAGNOSIS — I160 Hypertensive urgency: Secondary | ICD-10-CM

## 2019-08-03 DIAGNOSIS — Z23 Encounter for immunization: Secondary | ICD-10-CM

## 2019-08-03 DIAGNOSIS — D509 Iron deficiency anemia, unspecified: Secondary | ICD-10-CM

## 2019-08-03 DIAGNOSIS — F320 Major depressive disorder, single episode, mild: Secondary | ICD-10-CM

## 2019-08-03 NOTE — Patient Instructions (Signed)
F/u 4 MONTHS We will call with results Take your blood pressure medications when you get home

## 2019-08-03 NOTE — Progress Notes (Signed)
Subjective:   Patient presents for Medicare Annual/Subsequent preventive examination.  Review Past Medical/Family/Social: Per EMR   Pt HERE FOR wellness visit, chronic medical problems reviewed  Meds reviewed DM- last A1C 6.8% in Feb, currently on Lantus 15 units at bedtime, also on metformin 1082m BID   He has not been checking his blood sugars due to meter being broken, he is getting  New meter   Our last note states from Feb stated 5 units   HTN- he has not taken any meds this AM    Chronic insomnia- trazadone increased to 1070mback in Feb  CHF/CAD- followed by cardiology, has chronic lymphedema as well. His weight had been trending upward due to fluid retention, he is back on routine metalazone 2.63m10mhree times a week. Weight was up to 206lbs  Weight at home this AM 204lbs Breathing has been good, no chest pain He has had to eat more frozen foods, and more snacking during quarentine   Hypothyroidism- on levothyroxine      Risk Factors  Current exercise habits: NONE Dietary issues discussed: yes  Cardiac risk factors: Obesity (BMI >= 30 kg/m2). CHF, DM  Depression Screen  SEE PHQ 9 (Note: if answer to either of the following is "Yes", a more complete depression screening is indicated)  Over the past two weeks, have you felt down, depressed or hopeless?yes Over the past two weeks, have you felt little interest or pleasure in doing things? No Have you lost interest or pleasure in daily life? No Do you often feel hopeless? No Do you cry easily over simple problems? No   Activities of Daily Living  In your present state of health, do you have any difficulty performing the following activities?:  Driving? No  Managing money? No  Feeding yourself? No  Getting from bed to chaEnergy East Corporationflight of stairs? Yes Preparing food and eating?: Yes Bathing or showering? Yes Getting dressed: No  Getting to the toilet? No  Using the toilet:No  Moving around from place to  place:Yes In the past year have you fallen or had a near fall?:No  Are you sexually active? No  Do you have more than one partner? No   Hearing Difficulties: No  Do you often ask people to speak up or repeat themselves? No  Do you experience ringing or noises in your ears? No Do you have difficulty understanding soft or whispered voices? No  Do you feel that you have a problem with memory? No Do you often misplace items? No  Do you feel safe at home? Yes  Cognitive Testing  Alert? Yes Normal Appearance?Yes  Oriented to person? Yes Place? Yes  Time? Yes  Recall of three objects? Yes  Can perform simple calculations? Yes  Displays appropriate judgment?Yes  Can read the correct time from a watch face?Yes   List the Names of Other Physician/Practitioners you currently use:  [facility]ry [facility] GroDonovan KailScreening Tests / Date UTD- Needs flu shot today Colonoscopy    UTD        Shingrix- he had 1st vaccine Jan 2019 pNEUMONIA UTD  Influenza Vaccine  Due  Tetanus/tdap - declined due to cost   ROS:  GEN- denies fatigue, fever, weight loss,weakness, recent illness HEENT- denies eye drainage, change in vision, nasal discharge, CVS- denies chest pain, palpitations RESP- denies SOB, cough, wheeze ABD- denies N/V, change in stools, abd pain GU- denies dysuria,  hematuria, dribbling, incontinence MSK- + joint pain, muscle aches, injury Neuro- denies headache, dizziness, syncope, seizure activity  Physical: GEN- NAD, alert and oriented x3,sitting in wheelchair  HEENT- PERRL, EOMI, non injected sclera, pink conjunctiva, MMM, oropharynx clear, TM clear bilat , no effusion Neck- Supple, no thryomegaly, NO JVD CVS- RRR, no murmur RESP-CTAB ABD-NABS,soft,NT,ND Psych- normal affect and mood EXT- chronic 1+ edema,venous stasis changes, ballooning top bilat feet  Pulses- Radial 2+, DP-diminished  Assessment:    Annual wellness  medicare exam   Plan:    During the course of the visit the patient was educated and counseled about appropriate screening and preventive services including:  Flu shot / Shingles vaccine Given .   Mild depression/difficulty sleeping continue trazodone 117m   Chronic pain syndrome- continue pain medication per pain clinic Chronic lymphedema- compression hose, wraps,  CHF- back on metalaone in addition to Bumex per cardiology recommendations  Hypertensive urgency- given clonidine 0.15min office, he was asmptomatic from the elevation  Discussed dietary changes       Pt to resume his regular BP meds when he gets home   Repeat BP- improved some , not pt still asymptomatic    Fall/Audit C screen negative   DM type 2 with neuropathy . will keep lantus at 15 units , no hypoglycemia episodes, continue metformin, check A1C   [facility]  We will be able to fax in the labs as well.  - I will obtain the last eye visit   He has advanced directives, states he is DNR  Diet review for nutrition referral? Yes ____ Not Indicated __x__  Patient Instructions (the written plan) was given to the patient.  Medicare Attestation  I have personally reviewed:  The patient's medical and social history  Their use of alcohol, tobacco or illicit drugs  Their current medications and supplements  The patient's functional ability including ADLs,fall risks, home safety risks, cognitive, and hearing and visual impairment  Diet and physical activities  Evidence for depression or mood disorders  The patient's weight, height, BMI, and visual acuity have been recorded in the chart. I have made referrals, counseling, and provided education to the patient based on review of the above and I have provided the patient with a written personalized care plan for preventive services.

## 2019-08-04 LAB — CBC WITH DIFFERENTIAL/PLATELET
Absolute Monocytes: 272 cells/uL (ref 200–950)
Basophils Absolute: 72 cells/uL (ref 0–200)
Basophils Relative: 1.8 %
Eosinophils Absolute: 140 cells/uL (ref 15–500)
Eosinophils Relative: 3.5 %
HCT: 35 % — ABNORMAL LOW (ref 38.5–50.0)
Hemoglobin: 11.5 g/dL — ABNORMAL LOW (ref 13.2–17.1)
Lymphs Abs: 1084 cells/uL (ref 850–3900)
MCH: 26.9 pg — ABNORMAL LOW (ref 27.0–33.0)
MCHC: 32.9 g/dL (ref 32.0–36.0)
MCV: 82 fL (ref 80.0–100.0)
MPV: 10.7 fL (ref 7.5–12.5)
Monocytes Relative: 6.8 %
Neutro Abs: 2432 cells/uL (ref 1500–7800)
Neutrophils Relative %: 60.8 %
Platelets: 206 10*3/uL (ref 140–400)
RBC: 4.27 10*6/uL (ref 4.20–5.80)
RDW: 15.5 % — ABNORMAL HIGH (ref 11.0–15.0)
Total Lymphocyte: 27.1 %
WBC: 4 10*3/uL (ref 3.8–10.8)

## 2019-08-04 LAB — LIPID PANEL
Cholesterol: 133 mg/dL (ref <200)
HDL: 41 mg/dL (ref >=40)
LDL Cholesterol (Calc): 69 mg/dL (calc)
Non-HDL Cholesterol (Calc): 92 mg/dL (calc) (ref <130)
Total CHOL/HDL Ratio: 3.2 (calc) (ref <5.0)
Triglycerides: 144 mg/dL (ref <150)

## 2019-08-04 LAB — COMPREHENSIVE METABOLIC PANEL
AG Ratio: 2.2 (calc) (ref 1.0–2.5)
ALT: 23 U/L (ref 9–46)
AST: 27 U/L (ref 10–35)
Albumin: 4.1 g/dL (ref 3.6–5.1)
Alkaline phosphatase (APISO): 52 U/L (ref 35–144)
BUN: 12 mg/dL (ref 7–25)
CO2: 28 mmol/L (ref 20–32)
Calcium: 9.3 mg/dL (ref 8.6–10.3)
Chloride: 99 mmol/L (ref 98–110)
Creat: 0.76 mg/dL (ref 0.70–1.18)
Globulin: 1.9 g/dL (calc) (ref 1.9–3.7)
Glucose, Bld: 95 mg/dL (ref 65–99)
Potassium: 4.4 mmol/L (ref 3.5–5.3)
Sodium: 137 mmol/L (ref 135–146)
Total Bilirubin: 0.5 mg/dL (ref 0.2–1.2)
Total Protein: 6 g/dL — ABNORMAL LOW (ref 6.1–8.1)

## 2019-08-04 LAB — T3, FREE: T3, Free: 2.7 pg/mL (ref 2.3–4.2)

## 2019-08-04 LAB — TSH: TSH: 2.8 mIU/L (ref 0.40–4.50)

## 2019-08-04 LAB — HEMOGLOBIN A1C
Hgb A1c MFr Bld: 6.8 % of total Hgb — ABNORMAL HIGH (ref <5.7)
Mean Plasma Glucose: 148 (calc)
eAG (mmol/L): 8.2 (calc)

## 2019-08-04 LAB — PSA: PSA: 4.3 ng/mL — ABNORMAL HIGH (ref <=4.0)

## 2019-08-04 LAB — T4, FREE: Free T4: 1.5 ng/dL (ref 0.8–1.8)

## 2019-08-05 ENCOUNTER — Encounter: Payer: Self-pay | Admitting: *Deleted

## 2019-08-18 ENCOUNTER — Ambulatory Visit: Payer: Medicare Other | Admitting: Cardiology

## 2019-08-29 DIAGNOSIS — R262 Difficulty in walking, not elsewhere classified: Secondary | ICD-10-CM | POA: Diagnosis not present

## 2019-08-29 DIAGNOSIS — I5032 Chronic diastolic (congestive) heart failure: Secondary | ICD-10-CM | POA: Diagnosis not present

## 2019-08-29 DIAGNOSIS — I890 Lymphedema, not elsewhere classified: Secondary | ICD-10-CM | POA: Diagnosis not present

## 2019-08-29 DIAGNOSIS — R531 Weakness: Secondary | ICD-10-CM | POA: Diagnosis not present

## 2019-09-02 ENCOUNTER — Telehealth (INDEPENDENT_AMBULATORY_CARE_PROVIDER_SITE_OTHER): Payer: Medicare Other | Admitting: Cardiology

## 2019-09-02 ENCOUNTER — Encounter: Payer: Self-pay | Admitting: Cardiology

## 2019-09-02 VITALS — BP 173/89 | HR 89 | Ht 59.0 in | Wt 196.0 lb

## 2019-09-02 DIAGNOSIS — I5032 Chronic diastolic (congestive) heart failure: Secondary | ICD-10-CM

## 2019-09-02 DIAGNOSIS — I10 Essential (primary) hypertension: Secondary | ICD-10-CM

## 2019-09-02 MED ORDER — CARVEDILOL 6.25 MG PO TABS: 6.2500 mg | ORAL_TABLET | Freq: Two times a day (BID) | ORAL | 3 refills | Status: DC

## 2019-09-02 NOTE — Addendum Note (Signed)
Addended by: Debbora Lacrosse R on: 09/02/2019 01:38 PM   Modules accepted: Orders

## 2019-09-02 NOTE — Progress Notes (Signed)
Virtual Visit via Telephone Note   This visit type was conducted due to national recommendations for restrictions regarding the COVID-19 Pandemic (e.g. social distancing) in an effort to limit this patient's exposure and mitigate transmission in our community.  Due to his co-morbid illnesses, this patient is at least at moderate risk for complications without adequate follow up.  This format is felt to be most appropriate for this patient at this time.  The patient did not have access to video technology/had technical difficulties with video requiring transitioning to audio format only (telephone).  All issues noted in this document were discussed and addressed.  No physical exam could be performed with this format.  Please refer to the patient's chart for his  consent to telehealth for Michael [name].   Date:  09/02/2019   ID:  Michael [name], DOB [birth year], MRN [id number]  Patient Location: Home Provider Location: Office  PCP:  Michael Rossetti, MD  Cardiologist:  Michael Dolly, MD  Electrophysiologist:  None   Evaluation Performed:  Follow-Up Visit  Chief Complaint:  Follow up visit  History of Present Illness:    Michael [name] is a 73 y.o. male seen today for follow up of the following medical problems.   1. Chronic diastolic heart failure/Lymphedema - echo 05/2015 with normal LVEF 60-65%, grade II diastolic dysfunction - Michael [name] 2017 echo LVEF 54-00%, grade I diastolic dysfunction - edema likely combined diastolic HF and lymphedema   -He was 185 lbs at our last visit in 07/2018. Trended up to 196 and then 200 lbs in 11/2018. Up to 206 lbs today.    - ongoing swelling though improved. Weight down to 196 lbs.  - bumex 70m bid, metolazone 2.580mTuCain SaupeSat 11/3 labs Cr 0.76 BUN 12 - uses intermittent  machine for his lymphedema at home.   2. HTN - compliant with meds  3. PVCs - has been on coreg. No recent symptoms  SH: veteran, he is a [place]Served in  the [employer]ister is Michael Mylarourts also a patient of mine, recently passed away around labor day 20March 18, 2020rom pancreatic cancer    The patient does not have symptoms concerning for COVID-19 infection (fever, chills, cough, or new shortness of breath).    Past Medical History:  Diagnosis Date  . Arthritis   . Congestive heart disease ([facility]  . COPD (chronic obstructive pulmonary disease) ([place]  . DDD (degenerative disc disease), lumbosacral   . Depression   . Diabetic neuropathy ([facility]CD[place]  . Diastolic dysfunction   . Essential hypertension   . GERD (gastroesophageal reflux disease)   . Hyperlipidemia   . Iron deficiency anemia   . Left knee DJD   . Lumbar spinal stenosis   . Lymphedema 0203-19-2017 BOTH LEGS  . NASH (nonalcoholic steatohepatitis)   . Peripheral edema   . Peripheral edema 10/05/2015  . Type 2 diabetes mellitus ([facility]   Past Surgical History:  Procedure Laterality Date  . BIOPSY N/A 07/21/2014   Procedure: BIOPSY;  Surgeon: RoDaneil DolinMD;  Location: [facility];  Service: Endoscopy;  Laterality: N/A;  . CATARACT EXTRACTION W/PHACO Right 07/08/2016   Procedure: CATARACT EXTRACTION PHACO AND INTRAOCULAR LENS PLACEMENT RIGHT EYE;  Surgeon: KeTonny MD;  Location: [facility];  Service: Ophthalmology;  Laterality: Right;  CDE: 9.01  . CATARACT EXTRACTION W/PHACO Left 07/22/2016   Procedure: CATARACT EXTRACTION PHACO AND INTRAOCULAR LENS PLACEMENT LEFT EYE CDE=10.69;  Surgeon: KeTonny   MD;  Location: [facility];  Service: Ophthalmology;  Laterality: Left;  left -   . COLONOSCOPY  2011   [facility]: normal colon, normal distal ileum  . COLONOSCOPY WITH PROPOFOL N/A 07/21/2014   Procedure: ATTEMPTED COLONOSCOPY WITH PROPOFOL-HAD TO STOP DUE TO BRADYCARDIA;  Surgeon: Daneil Dolin, MD;  Location: [facility];  Service: Endoscopy;  Laterality: N/A;  . EGD with enteroscopy  2011   [facility]: normal esophagus and stomach. Normal duodenum, jejunum. No evidence of AVMs.   .  ESOPHAGOGASTRODUODENOSCOPY (EGD) WITH PROPOFOL N/A 07/21/2014   Procedure: ESOPHAGOGASTRODUODENOSCOPY (EGD) WITH PROPOFOL;  Surgeon: Daneil Dolin, MD;  Location: [facility];  Service: Endoscopy;  Laterality: N/A;  . HERNIA REPAIR    . KNEE ARTHROSCOPY WITH MEDIAL MENISECTOMY Left 11/06/2012   Procedure: KNEE ARTHROSCOPY WITH MEDIAL MENISECTOMY;  Surgeon: Carole Civil, MD;  Location: [facility];  Service: Orthopedics;  Laterality: Left;  . Lipoma removal     Stomach  . LUMBAR LAMINECTOMY/DECOMPRESSION MICRODISCECTOMY Left 08/30/2013   Procedure: LUMBAR LAMINECTOMY/DECOMPRESSION MICRODISCECTOMY LEFT  LUMBAR TWO THREE;  Surgeon: Otilio Connors, MD;  Location: [place] [facility];  Service: Neurosurgery;  Laterality: Left;  . SHOULDER SURGERY     Rght-rotator cuff  . TOOTH EXTRACTION       Current Meds  Medication Sig  . aspirin EC 81 MG tablet Take 81 mg by mouth daily.  . B-D ULTRAFINE III SHORT PEN 31G X 8 MM MISC USE AS DIRECTED DAILY WITH LANTUS.  Marland Kitchen benazepril (LOTENSIN) 40 MG tablet TAKE ONE TABLET BY MOUTH DAILY.  Marland Kitchen Blood Glucose Monitoring Suppl (BLOOD GLUCOSE SYSTEM PAK) KIT Please dispense based on patient and insurance preference. Use as directed to monitor FSBS 2x daily. Dx: E11.9  . bumetanide (BUMEX) 2 MG tablet TAKE 1&1/2 TABLETS BY MOUTH TWICE DAILY. (Patient taking differently: TAKE 2 MG (1 TABLET) TABLETS BY MOUTH TWICE DAILY.)  . carvedilol (COREG) 3.125 MG tablet TAKE 1 TABLET BY MOUTH TWO TIMES DAILY WITH A MEAL.  Marland Kitchen diclofenac sodium (VOLTAREN) 1 % GEL Apply 4 g topically 4 (four) times daily as needed (pain).  . Ferrous Sulfate (IRON) 325 (65 FE) MG TABS Take 1 tablet by mouth daily.   Marland Kitchen gabapentin (NEURONTIN) 400 MG capsule Take 1 capsule (400 mg total) by mouth 3 (three) times daily.  . Glucose Blood (BLOOD GLUCOSE TEST STRIPS) STRP Please dispense based on patient and insurance preference. Use as directed to monitor FSBS 2x daily. Dx: E11.9  . Insulin Glargine (LANTUS SOLOSTAR)  100 UNIT/ML Solostar Pen Inject 5 Units into the skin daily at 10 pm.  . Lancets MISC Please dispense based on patient and insurance preference. Use as directed to monitor FSBS 2x daily. Dx: E11.9  . levothyroxine (SYNTHROID, LEVOTHROID) 100 MCG tablet TAKE 1 TABLET BY MOUTH DAILY BEFORE BREAKFAST.  . magnesium oxide (MAG-OX) 400 MG tablet Take 1 tablet (400 mg total) by mouth 2 (two) times daily.  . metFORMIN (GLUCOPHAGE) 1000 MG tablet TAKE ONE TABLET BY MOUTH TWICE DAILY WITH A MEAL.  Marland Kitchen metolazone (ZAROXOLYN) 2.5 MG tablet TAKE EVERY Tuesday, Thursday & Saturday.  . nystatin (MYCOSTATIN/NYSTOP) powder Apply topically 4 (four) times daily.  Marland Kitchen nystatin cream (MYCOSTATIN) APPLY TO AFFECTED AREA TWICE DAILY.  Marland Kitchen oxyCODONE-acetaminophen (PERCOCET) 7.5-325 MG tablet TAKE 1 TABLET BY MOUTH EVERY 6 HOURS AS NEEDED FOR SEVERE PAIN.  Marland Kitchen pantoprazole (PROTONIX) 40 MG tablet Take 1 tablet (40 mg total) by mouth daily.  . potassium chloride (K-DUR) 10  MEQ tablet Take 1 tablet (10 mEq total) by mouth 2 (two) times daily.  . pravastatin (PRAVACHOL) 40 MG tablet TAKE ONE TABLET BY MOUTH DAILY. (Patient taking differently: TAKE ONE TABLET BY MOUTH DAILY IN THE EVENING)  . traZODone (DESYREL) 100 MG tablet TAKE 1 TABLET BY MOUTH AT BEDTIME AS NEEDED FOR SLEEP.  Marland Kitchen triamcinolone cream (KENALOG) 0.1 % Apply 1 application topically 2 (two) times daily. To arms/hand     Allergies:   Patient has no known allergies.   Social History   Tobacco Use  . Smoking status: Former Smoker    Packs/day: 2.50    Years: 44.00    Pack years: 110.00    Types: Cigarettes    Start date: 05/07/1955    Quit date: 10/01/1999    Years since quitting: 19.9  . Smokeless tobacco: Never Used  Substance Use Topics  . Alcohol use: No    Alcohol/week: 0.0 standard drinks  . Drug use: No     Family Hx: The patient's family history includes Cancer in his father and mother; Depression in his mother; Diabetes in his brother, brother,  father, mother, sister, and sister; Heart disease in his father, mother, sister, sister, and sister; Hyperlipidemia in his father, mother, sister, sister, and sister; Hypertension in his father, mother, sister, sister, and sister; Rectal cancer in his mother.  ROS:   Please see the history of present illness.    All other systems reviewed and are negative.   Prior CV studies:   The following studies were reviewed today:  Labs/Other Tests and Data Reviewed:    EKG:  No ECG reviewed.  Recent Labs: 04/14/2019: Magnesium 1.6 08/03/2019: ALT 23; BUN 12; Creat 0.76; Hemoglobin 11.5; Platelets 206; Potassium 4.4; Sodium 137; TSH 2.80   Recent Lipid Panel Lab Results  Component Value Date/Time   CHOL 133 08/03/2019 10:10 AM   TRIG 144 08/03/2019 10:10 AM   HDL 41 08/03/2019 10:10 AM   CHOLHDL 3.2 08/03/2019 10:10 AM   LDLCALC 69 08/03/2019 10:10 AM    Wt Readings from Last 3 Encounters:  09/02/19 196 lb (88.9 kg)  08/03/19 205 lb 9.6 oz (93.3 kg)  06/15/19 206 lb (93.4 kg)     Objective:    Vital Signs:  BP (!) 173/89   Pulse 89   Ht 4' 11"  (1.499 m)   Wt 196 lb (88.9 kg)   BMI 39.59 kg/m    Normal affect. Normal speech pattern and tone. Comfortable, no apparent distress. No audible signs of SOB or wheezing.   ASSESSMENT & PLAN:    1.Acute on chronic diastolic HF/Lymphedema - down 10 lbs since our last visit when we resumed the metolazone 3 times a week - renal function was stable last month with labs - conitnue current regimen   2. HTN - elevated bp's. We will increase his coreg to 6.13m bid, he will call in 1 week with home bp's and heart rates  COVID-19 Education: The signs and symptoms of COVID-19 were discussed with the patient and how to seek care for testing (follow up with PCP or arrange E-visit).  The importance of social distancing was discussed today.  Time:   Today, I have spent 1 minutes with the patient with telehealth technology discussing the  above problems.     Medication Adjustments/Labs and Tests Ordered: Current medicines are reviewed at length with the patient today.  Concerns regarding medicines are outlined above.   Tests Ordered: No orders of the defined types  were placed in this encounter.   Medication Changes: No orders of the defined types were placed in this encounter.   Follow Up:  Either In Person or Virtual in 3 month(s)  Signed, Michael Dolly, MD  09/02/2019 11:32 AM    [facility]

## 2019-09-02 NOTE — Patient Instructions (Signed)
Medication Instructions:  Increase COREG TO 6.25 MG- TWO TIMES DAILY   Labwork: NONE  Testing/Procedures: NONE  Follow-Up: Your physician recommends that you schedule a follow-up appointment in: 3 MONTHS    Any Other Special Instructions Will Be Listed Below (If Applicable).  PLEASE CALL IN 1 WEEK WITH AN UPDATE ON BLOOD PRESSURE & HEART RATES.    If you need a refill on your cardiac medications before your next appointment, please call your pharmacy.

## 2019-09-08 DIAGNOSIS — I890 Lymphedema, not elsewhere classified: Secondary | ICD-10-CM | POA: Diagnosis not present

## 2019-09-08 DIAGNOSIS — E559 Vitamin D deficiency, unspecified: Secondary | ICD-10-CM | POA: Diagnosis not present

## 2019-09-08 DIAGNOSIS — G894 Chronic pain syndrome: Secondary | ICD-10-CM | POA: Diagnosis not present

## 2019-09-08 DIAGNOSIS — Z79899 Other long term (current) drug therapy: Secondary | ICD-10-CM | POA: Diagnosis not present

## 2019-09-20 ENCOUNTER — Emergency Department (HOSPITAL_COMMUNITY): Payer: Medicare Other

## 2019-09-20 ENCOUNTER — Inpatient Hospital Stay (HOSPITAL_COMMUNITY)
Admission: EM | Admit: 2019-09-20 | Discharge: 2019-09-23 | DRG: 286 | Disposition: A | Discharge status: Home / Self Care | Payer: Medicare Other | Attending: Cardiovascular Disease | Admitting: Cardiovascular Disease

## 2019-09-20 ENCOUNTER — Encounter (HOSPITAL_COMMUNITY): Payer: Self-pay | Admitting: *Deleted

## 2019-09-20 ENCOUNTER — Inpatient Hospital Stay (HOSPITAL_COMMUNITY): Payer: Medicare Other

## 2019-09-20 DIAGNOSIS — Z993 Dependence on wheelchair: Secondary | ICD-10-CM

## 2019-09-20 DIAGNOSIS — I110 Hypertensive heart disease with heart failure: Secondary | ICD-10-CM | POA: Diagnosis not present

## 2019-09-20 DIAGNOSIS — Z794 Long term (current) use of insulin: Secondary | ICD-10-CM

## 2019-09-20 DIAGNOSIS — Z20828 Contact with and (suspected) exposure to other viral communicable diseases: Secondary | ICD-10-CM | POA: Diagnosis present

## 2019-09-20 DIAGNOSIS — Z79890 Hormone replacement therapy (postmenopausal): Secondary | ICD-10-CM | POA: Diagnosis not present

## 2019-09-20 DIAGNOSIS — E039 Hypothyroidism, unspecified: Secondary | ICD-10-CM | POA: Diagnosis present

## 2019-09-20 DIAGNOSIS — I10 Essential (primary) hypertension: Secondary | ICD-10-CM | POA: Diagnosis not present

## 2019-09-20 DIAGNOSIS — I890 Lymphedema, not elsewhere classified: Secondary | ICD-10-CM | POA: Diagnosis present

## 2019-09-20 DIAGNOSIS — J810 Acute pulmonary edema: Secondary | ICD-10-CM | POA: Diagnosis not present

## 2019-09-20 DIAGNOSIS — E1142 Type 2 diabetes mellitus with diabetic polyneuropathy: Secondary | ICD-10-CM | POA: Diagnosis not present

## 2019-09-20 DIAGNOSIS — E1149 Type 2 diabetes mellitus with other diabetic neurological complication: Secondary | ICD-10-CM | POA: Diagnosis present

## 2019-09-20 DIAGNOSIS — I2510 Atherosclerotic heart disease of native coronary artery without angina pectoris: Secondary | ICD-10-CM | POA: Diagnosis present

## 2019-09-20 DIAGNOSIS — J449 Chronic obstructive pulmonary disease, unspecified: Secondary | ICD-10-CM | POA: Diagnosis not present

## 2019-09-20 DIAGNOSIS — I5032 Chronic diastolic (congestive) heart failure: Secondary | ICD-10-CM | POA: Diagnosis not present

## 2019-09-20 DIAGNOSIS — I214 Non-ST elevation (NSTEMI) myocardial infarction: Secondary | ICD-10-CM | POA: Diagnosis not present

## 2019-09-20 DIAGNOSIS — Z8249 Family history of ischemic heart disease and other diseases of the circulatory system: Secondary | ICD-10-CM | POA: Diagnosis not present

## 2019-09-20 DIAGNOSIS — Z833 Family history of diabetes mellitus: Secondary | ICD-10-CM | POA: Diagnosis not present

## 2019-09-20 DIAGNOSIS — R0602 Shortness of breath: Secondary | ICD-10-CM | POA: Diagnosis not present

## 2019-09-20 DIAGNOSIS — G894 Chronic pain syndrome: Secondary | ICD-10-CM | POA: Diagnosis present

## 2019-09-20 DIAGNOSIS — R0902 Hypoxemia: Secondary | ICD-10-CM

## 2019-09-20 DIAGNOSIS — I5043 Acute on chronic combined systolic (congestive) and diastolic (congestive) heart failure: Secondary | ICD-10-CM | POA: Insufficient documentation

## 2019-09-20 DIAGNOSIS — R069 Unspecified abnormalities of breathing: Secondary | ICD-10-CM | POA: Diagnosis not present

## 2019-09-20 DIAGNOSIS — E785 Hyperlipidemia, unspecified: Secondary | ICD-10-CM | POA: Diagnosis present

## 2019-09-20 DIAGNOSIS — Z87891 Personal history of nicotine dependence: Secondary | ICD-10-CM | POA: Diagnosis not present

## 2019-09-20 DIAGNOSIS — I5021 Acute systolic (congestive) heart failure: Secondary | ICD-10-CM | POA: Diagnosis not present

## 2019-09-20 DIAGNOSIS — I248 Other forms of acute ischemic heart disease: Secondary | ICD-10-CM

## 2019-09-20 DIAGNOSIS — I361 Nonrheumatic tricuspid (valve) insufficiency: Secondary | ICD-10-CM | POA: Diagnosis not present

## 2019-09-20 DIAGNOSIS — I872 Venous insufficiency (chronic) (peripheral): Secondary | ICD-10-CM | POA: Diagnosis present

## 2019-09-20 DIAGNOSIS — Z79899 Other long term (current) drug therapy: Secondary | ICD-10-CM | POA: Diagnosis not present

## 2019-09-20 DIAGNOSIS — Z7982 Long term (current) use of aspirin: Secondary | ICD-10-CM | POA: Diagnosis not present

## 2019-09-20 DIAGNOSIS — R0689 Other abnormalities of breathing: Secondary | ICD-10-CM | POA: Diagnosis not present

## 2019-09-20 DIAGNOSIS — R350 Frequency of micturition: Secondary | ICD-10-CM | POA: Diagnosis not present

## 2019-09-20 DIAGNOSIS — J9601 Acute respiratory failure with hypoxia: Secondary | ICD-10-CM | POA: Diagnosis not present

## 2019-09-20 DIAGNOSIS — I428 Other cardiomyopathies: Secondary | ICD-10-CM | POA: Diagnosis present

## 2019-09-20 DIAGNOSIS — R4020 Unspecified coma: Secondary | ICD-10-CM | POA: Diagnosis not present

## 2019-09-20 DIAGNOSIS — K7581 Nonalcoholic steatohepatitis (NASH): Secondary | ICD-10-CM | POA: Diagnosis not present

## 2019-09-20 DIAGNOSIS — E782 Mixed hyperlipidemia: Secondary | ICD-10-CM | POA: Diagnosis not present

## 2019-09-20 DIAGNOSIS — Z7952 Long term (current) use of systemic steroids: Secondary | ICD-10-CM | POA: Diagnosis not present

## 2019-09-20 LAB — CBC WITH DIFFERENTIAL/PLATELET
Abs Immature Granulocytes: 0.04 10*3/uL (ref 0.00–0.07)
Basophils Absolute: 0.1 10*3/uL (ref 0.0–0.1)
Basophils Relative: 1 %
Eosinophils Absolute: 0.2 10*3/uL (ref 0.0–0.5)
Eosinophils Relative: 2 %
HCT: 44.8 % (ref 39.0–52.0)
Hemoglobin: 13.8 g/dL (ref 13.0–17.0)
Immature Granulocytes: 0 %
Lymphocytes Relative: 11 %
Lymphs Abs: 1 10*3/uL (ref 0.7–4.0)
MCH: 27.1 pg (ref 26.0–34.0)
MCHC: 30.8 g/dL (ref 30.0–36.0)
MCV: 87.8 fL (ref 80.0–100.0)
Monocytes Absolute: 0.4 10*3/uL (ref 0.1–1.0)
Monocytes Relative: 5 %
Neutro Abs: 7.3 10*3/uL (ref 1.7–7.7)
Neutrophils Relative %: 81 %
Platelets: 277 10*3/uL (ref 150–400)
RBC: 5.1 MIL/uL (ref 4.22–5.81)
RDW: 15.6 % — ABNORMAL HIGH (ref 11.5–15.5)
WBC: 9 10*3/uL (ref 4.0–10.5)
nRBC: 0 % (ref 0.0–0.2)

## 2019-09-20 LAB — BRAIN NATRIURETIC PEPTIDE: B Natriuretic Peptide: 233 pg/mL — ABNORMAL HIGH (ref 0.0–100.0)

## 2019-09-20 LAB — COMPREHENSIVE METABOLIC PANEL
ALT: 21 U/L (ref 0–44)
AST: 23 U/L (ref 15–41)
Albumin: 3.9 g/dL (ref 3.5–5.0)
Alkaline Phosphatase: 72 U/L (ref 38–126)
Anion gap: 12 (ref 5–15)
BUN: 11 mg/dL (ref 8–23)
CO2: 25 mmol/L (ref 22–32)
Calcium: 8.8 mg/dL — ABNORMAL LOW (ref 8.9–10.3)
Chloride: 97 mmol/L — ABNORMAL LOW (ref 98–111)
Creatinine, Ser: 0.72 mg/dL (ref 0.61–1.24)
GFR calc Af Amer: >60 mL/min (ref >60)
GFR calc non Af Amer: >60 mL/min (ref >60)
Glucose, Bld: 227 mg/dL — ABNORMAL HIGH (ref 70–99)
Potassium: 4 mmol/L (ref 3.5–5.1)
Sodium: 134 mmol/L — ABNORMAL LOW (ref 135–145)
Total Bilirubin: 0.6 mg/dL (ref 0.3–1.2)
Total Protein: 6.9 g/dL (ref 6.5–8.1)

## 2019-09-20 LAB — GLUCOSE, CAPILLARY
Glucose-Capillary: 130 mg/dL — ABNORMAL HIGH (ref 70–99)
Glucose-Capillary: 130 mg/dL — ABNORMAL HIGH (ref 70–99)

## 2019-09-20 LAB — TROPONIN I (HIGH SENSITIVITY)
Troponin I (High Sensitivity): 77 ng/L — ABNORMAL HIGH (ref <18)
Troponin I (High Sensitivity): 270 ng/L (ref <18)
Troponin I (High Sensitivity): 628 ng/L (ref <18)
Troponin I (High Sensitivity): 693 ng/L (ref <18)

## 2019-09-20 LAB — ECHOCARDIOGRAM COMPLETE
Height: 59 in
Weight: 3139.35 oz

## 2019-09-20 LAB — RESPIRATORY PANEL BY RT PCR (FLU A&B, COVID)
Influenza A by PCR: NEGATIVE
Influenza B by PCR: NEGATIVE
SARS Coronavirus 2 by RT PCR: NEGATIVE

## 2019-09-20 LAB — POC SARS CORONAVIRUS 2 AG -  ED: SARS Coronavirus 2 Ag: NEGATIVE

## 2019-09-20 LAB — MRSA PCR SCREENING: MRSA by PCR: NEGATIVE

## 2019-09-20 MED ORDER — SODIUM CHLORIDE 0.9 % IV SOLN: 250.0000 mL | INTRAVENOUS | Status: DC | PRN

## 2019-09-20 MED ORDER — SODIUM CHLORIDE 0.9% FLUSH
3.0000 mL | Freq: Two times a day (BID) | INTRAVENOUS | Status: DC
  Administered 2019-09-20 – 2019-09-22 (×6): 3 mL via INTRAVENOUS

## 2019-09-20 MED ORDER — CARVEDILOL 6.25 MG PO TABS
6.2500 mg | ORAL_TABLET | Freq: Two times a day (BID) | ORAL | Status: DC
  Administered 2019-09-20 – 2019-09-23 (×7): 6.25 mg via ORAL
  Filled 2019-09-20: qty 1
  Filled 2019-09-20: qty 2
  Filled 2019-09-20: qty 1
  Filled 2019-09-20 (×2): qty 2
  Filled 2019-09-20: qty 1
  Filled 2019-09-20: qty 2

## 2019-09-20 MED ORDER — ASPIRIN EC 81 MG PO TBEC
81.0000 mg | DELAYED_RELEASE_TABLET | Freq: Every day | ORAL | Status: DC
  Administered 2019-09-20 – 2019-09-23 (×4): 81 mg via ORAL
  Filled 2019-09-20 (×4): qty 1

## 2019-09-20 MED ORDER — GABAPENTIN 400 MG PO CAPS
400.0000 mg | ORAL_CAPSULE | Freq: Three times a day (TID) | ORAL | Status: DC
  Administered 2019-09-20 – 2019-09-23 (×9): 400 mg via ORAL
  Filled 2019-09-20 (×9): qty 1

## 2019-09-20 MED ORDER — LABETALOL HCL 5 MG/ML IV SOLN
10.0000 mg | INTRAVENOUS | Status: DC | PRN
  Administered 2019-09-20: 20:00:00 10 mg via INTRAVENOUS
  Filled 2019-09-20: qty 4

## 2019-09-20 MED ORDER — ENOXAPARIN SODIUM 40 MG/0.4ML ~~LOC~~ SOLN
40.0000 mg | SUBCUTANEOUS | Status: DC
  Administered 2019-09-20: 15:00:00 40 mg via SUBCUTANEOUS
  Filled 2019-09-20: qty 0.4

## 2019-09-20 MED ORDER — INSULIN ASPART 100 UNIT/ML ~~LOC~~ SOLN
0.0000 [IU] | Freq: Three times a day (TID) | SUBCUTANEOUS | Status: DC
  Administered 2019-09-20: 18:00:00 2 [IU] via SUBCUTANEOUS
  Administered 2019-09-21 (×2): 3 [IU] via SUBCUTANEOUS
  Administered 2019-09-21: 2 [IU] via SUBCUTANEOUS
  Administered 2019-09-22 – 2019-09-23 (×4): 3 [IU] via SUBCUTANEOUS
  Administered 2019-09-23: 13:00:00 8 [IU] via SUBCUTANEOUS
  Filled 2019-09-20: qty 1

## 2019-09-20 MED ORDER — DICLOFENAC SODIUM 1 % TD GEL
4.0000 g | Freq: Four times a day (QID) | TRANSDERMAL | Status: DC | PRN
  Administered 2019-09-20 – 2019-09-21 (×2): 4 g via TOPICAL
  Filled 2019-09-20: qty 100

## 2019-09-20 MED ORDER — METOLAZONE 5 MG PO TABS
2.5000 mg | ORAL_TABLET | Freq: Every day | ORAL | Status: DC
  Administered 2019-09-20 – 2019-09-21 (×2): 2.5 mg via ORAL
  Filled 2019-09-20 (×5): qty 1

## 2019-09-20 MED ORDER — ACETAMINOPHEN 650 MG RE SUPP: 650.0000 mg | Freq: Four times a day (QID) | RECTAL | Status: DC | PRN

## 2019-09-20 MED ORDER — CHLORHEXIDINE GLUCONATE CLOTH 2 % EX PADS
6.0000 | MEDICATED_PAD | Freq: Every day | CUTANEOUS | Status: DC
  Administered 2019-09-21 – 2019-09-23 (×3): 6 via TOPICAL

## 2019-09-20 MED ORDER — PRAVASTATIN SODIUM 40 MG PO TABS
40.0000 mg | ORAL_TABLET | Freq: Every evening | ORAL | Status: DC
  Administered 2019-09-20 – 2019-09-22 (×3): 40 mg via ORAL
  Filled 2019-09-20 (×5): qty 1

## 2019-09-20 MED ORDER — PANTOPRAZOLE SODIUM 40 MG PO TBEC
40.0000 mg | DELAYED_RELEASE_TABLET | Freq: Every day | ORAL | Status: DC
  Administered 2019-09-20 – 2019-09-23 (×4): 40 mg via ORAL
  Filled 2019-09-20 (×5): qty 1

## 2019-09-20 MED ORDER — FUROSEMIDE 10 MG/ML IJ SOLN
60.0000 mg | Freq: Once | INTRAMUSCULAR | Status: AC
  Administered 2019-09-20: 11:00:00 60 mg via INTRAVENOUS
  Filled 2019-09-20: qty 6

## 2019-09-20 MED ORDER — ACETAMINOPHEN 325 MG PO TABS
650.0000 mg | ORAL_TABLET | Freq: Four times a day (QID) | ORAL | Status: DC | PRN
  Administered 2019-09-20 – 2019-09-23 (×3): 650 mg via ORAL
  Filled 2019-09-20 (×3): qty 2

## 2019-09-20 MED ORDER — TRAZODONE HCL 50 MG PO TABS: 100.0000 mg | ORAL_TABLET | Freq: Every evening | ORAL | Status: DC | PRN

## 2019-09-20 MED ORDER — POTASSIUM CHLORIDE 20 MEQ PO PACK
10.0000 meq | PACK | Freq: Two times a day (BID) | ORAL | Status: DC
  Administered 2019-09-20: 10 meq via ORAL
  Filled 2019-09-20 (×5): qty 1

## 2019-09-20 MED ORDER — LEVALBUTEROL HCL 0.63 MG/3ML IN NEBU: 0.6300 mg | INHALATION_SOLUTION | Freq: Four times a day (QID) | RESPIRATORY_TRACT | Status: DC | PRN

## 2019-09-20 MED ORDER — HEPARIN (PORCINE) 25000 UT/250ML-% IV SOLN
1400.0000 [IU]/h | INTRAVENOUS | Status: DC
  Administered 2019-09-20: 20:00:00 800 [IU]/h via INTRAVENOUS
  Administered 2019-09-21: 1000 [IU]/h via INTRAVENOUS
  Filled 2019-09-20 (×3): qty 250

## 2019-09-20 MED ORDER — ONDANSETRON HCL 4 MG PO TABS: 4.0000 mg | ORAL_TABLET | Freq: Four times a day (QID) | ORAL | Status: DC | PRN

## 2019-09-20 MED ORDER — HEPARIN BOLUS VIA INFUSION
4000.0000 [IU] | Freq: Once | INTRAVENOUS | Status: AC
  Administered 2019-09-20: 20:00:00 4000 [IU] via INTRAVENOUS
  Filled 2019-09-20: qty 4000

## 2019-09-20 MED ORDER — POTASSIUM CHLORIDE 20 MEQ/15ML (10%) PO SOLN
10.0000 meq | Freq: Two times a day (BID) | ORAL | Status: DC
  Administered 2019-09-20 – 2019-09-23 (×6): 10 meq via ORAL
  Filled 2019-09-20: qty 15
  Filled 2019-09-20: qty 30
  Filled 2019-09-20: qty 15
  Filled 2019-09-20 (×3): qty 30

## 2019-09-20 MED ORDER — NITROGLYCERIN 0.4 MG SL SUBL
0.4000 mg | SUBLINGUAL_TABLET | Freq: Once | SUBLINGUAL | Status: AC
  Administered 2019-09-20: 11:00:00 0.4 mg via SUBLINGUAL
  Filled 2019-09-20: qty 1

## 2019-09-20 MED ORDER — MAGNESIUM OXIDE 400 (241.3 MG) MG PO TABS
400.0000 mg | ORAL_TABLET | Freq: Two times a day (BID) | ORAL | Status: DC
  Administered 2019-09-20 – 2019-09-23 (×7): 400 mg via ORAL
  Filled 2019-09-20 (×7): qty 1

## 2019-09-20 MED ORDER — FUROSEMIDE 10 MG/ML IJ SOLN
80.0000 mg | Freq: Two times a day (BID) | INTRAMUSCULAR | Status: DC
  Administered 2019-09-20 – 2019-09-22 (×4): 80 mg via INTRAVENOUS
  Filled 2019-09-20 (×4): qty 8

## 2019-09-20 MED ORDER — SODIUM CHLORIDE 0.9% FLUSH: 3.0000 mL | INTRAVENOUS | Status: DC | PRN

## 2019-09-20 MED ORDER — LEVOTHYROXINE SODIUM 100 MCG PO TABS
100.0000 ug | ORAL_TABLET | Freq: Every day | ORAL | Status: DC
  Administered 2019-09-21 – 2019-09-23 (×3): 100 ug via ORAL
  Filled 2019-09-20 (×3): qty 1

## 2019-09-20 MED ORDER — OXYCODONE-ACETAMINOPHEN 5-325 MG PO TABS
1.5000 | ORAL_TABLET | Freq: Four times a day (QID) | ORAL | Status: DC | PRN
  Administered 2019-09-20 – 2019-09-23 (×9): 1.5 via ORAL
  Filled 2019-09-20 (×9): qty 2

## 2019-09-20 MED ORDER — BENAZEPRIL HCL 40 MG PO TABS
40.0000 mg | ORAL_TABLET | Freq: Every day | ORAL | Status: DC
  Administered 2019-09-20 – 2019-09-22 (×3): 40 mg via ORAL
  Filled 2019-09-20 (×3): qty 1
  Filled 2019-09-20: qty 4
  Filled 2019-09-20 (×2): qty 1
  Filled 2019-09-20: qty 4

## 2019-09-20 MED ORDER — INSULIN ASPART 100 UNIT/ML ~~LOC~~ SOLN
0.0000 [IU] | Freq: Every day | SUBCUTANEOUS | Status: DC
  Administered 2019-09-21 – 2019-09-22 (×2): 2 [IU] via SUBCUTANEOUS

## 2019-09-20 MED ORDER — INSULIN GLARGINE 100 UNIT/ML ~~LOC~~ SOLN
5.0000 [IU] | Freq: Every day | SUBCUTANEOUS | Status: DC
  Administered 2019-09-20 – 2019-09-22 (×3): 5 [IU] via SUBCUTANEOUS
  Filled 2019-09-20 (×5): qty 0.05

## 2019-09-20 MED ORDER — FERROUS SULFATE 325 (65 FE) MG PO TABS
325.0000 mg | ORAL_TABLET | Freq: Every day | ORAL | Status: DC
  Administered 2019-09-20 – 2019-09-23 (×4): 325 mg via ORAL
  Filled 2019-09-20 (×4): qty 1

## 2019-09-20 MED ORDER — ONDANSETRON HCL 4 MG/2ML IJ SOLN: 4.0000 mg | Freq: Four times a day (QID) | INTRAMUSCULAR | Status: DC | PRN

## 2019-09-20 NOTE — ED Provider Notes (Signed)
Emergency Department Provider Note   I have reviewed the triage vital signs and the nursing notes.   HISTORY  Chief Complaint Shortness of Breath   HPI Michael [name] is a 73 y.o. male with PMH of CHF, HTN, DM, NASH presents to the emergency department by EMS with shortness of breath over the past 24 hours.  Patient with history of both COPD and CHF.  He is not on home oxygen.  He was hypoxic and very hypertensive with EMS.  Patient states that his swelling in the lower extremities has increased significantly in the last few days.  He denies any fever, chills, cough, chest pain. No COVID 19 contacts. Patient felt acute worsening this AM so called EMS.    Past Medical History:  Diagnosis Date  . Arthritis   . Congestive heart disease ([place]) 11/2015  . COPD (chronic obstructive pulmonary disease) ([place])   . DDD (degenerative disc disease), lumbosacral   . Depression   . Diabetic neuropathy ([place])   . Diastolic dysfunction   . Essential hypertension   . GERD (gastroesophageal reflux disease)   . Hyperlipidemia   . Iron deficiency anemia   . Left knee DJD   . Lumbar spinal stenosis   . Lymphedema 11/2015   BOTH LEGS  . NASH (nonalcoholic steatohepatitis)   . Peripheral edema   . Peripheral edema 10/05/2015  . Type 2 diabetes mellitus [facility])     Patient Active Problem List   Diagnosis Date Noted  . Acute hypoxemic respiratory failure ([place]) 09/20/2019  . Chronic insomnia 11/18/2018  . Chronic pain syndrome 05/06/2018  . Depression, major, single episode, mild ([place]) 06/20/2017  . DNR (do not resuscitate) 06/20/2017  . Chronic diastolic CHF (congestive heart failure) ([place]) 10/23/2016  . Generalized weakness 08/01/2016  . Intertriginous candidiasis 12/23/2014  . Hypothyroidism 11/30/2014  . Sinus pause 07/21/2014  . Liver fibrosis 06/29/2014  . IDA (iron deficiency anemia) 06/29/2014  . Iron deficiency anemia 05/31/2014  . Hepatomegaly 05/25/2014  . Difficulty walking  05/03/2014  . Knee pain, chronic 05/03/2014  . Spinal stenosis of lumbar region 07/06/2013  . Carpal tunnel syndrome 04/13/2013  . Lymphedema 04/13/2013  . DDD (degenerative disc disease), lumbosacral 04/13/2013  . Peripheral neuropathy 01/05/2013  . S/P arthroscopy of left knee 11/09/2012  . Meniscus tear 10/07/2012  . NASH (nonalcoholic steatohepatitis) 05/22/2007  . Type II diabetes mellitus with neurological manifestations ([place]) 05/20/2007  . Hyperlipidemia 10/15/2006  . OBESITY, MORBID 10/15/2006  . Essential hypertension 10/15/2006  . GERD 10/15/2006    Past Surgical History:  Procedure Laterality Date  . BIOPSY N/A 07/21/2014   Procedure: BIOPSY;  Surgeon: Daneil Dolin, MD;  Location: [facility];  Service: Endoscopy;  Laterality: N/A;  . CATARACT EXTRACTION W/PHACO Right 07/08/2016   Procedure: CATARACT EXTRACTION PHACO AND INTRAOCULAR LENS PLACEMENT RIGHT EYE;  Surgeon: Tonny Branch, MD;  Location: [facility];  Service: Ophthalmology;  Laterality: Right;  CDE: 9.01  . CATARACT EXTRACTION W/PHACO Left 07/22/2016   Procedure: CATARACT EXTRACTION PHACO AND INTRAOCULAR LENS PLACEMENT LEFT EYE CDE=10.69;  Surgeon: Tonny Branch, MD;  Location: [facility];  Service: Ophthalmology;  Laterality: Left;  left -   . COLONOSCOPY  2011   [facility]: normal colon, normal distal ileum  . COLONOSCOPY WITH PROPOFOL N/A 07/21/2014   Procedure: ATTEMPTED COLONOSCOPY WITH PROPOFOL-HAD TO STOP DUE TO BRADYCARDIA;  Surgeon: Daneil Dolin, MD;  Location: [facility];  Service: Endoscopy;  Laterality: N/A;  . EGD with enteroscopy  2011  [place]: normal esophagus and stomach. Normal duodenum, jejunum. No evidence of AVMs.   . ESOPHAGOGASTRODUODENOSCOPY (EGD) WITH PROPOFOL N/A 07/21/2014   Procedure: ESOPHAGOGASTRODUODENOSCOPY (EGD) WITH PROPOFOL;  Surgeon: Daneil Dolin, MD;  Location: [facility];  Service: Endoscopy;  Laterality: N/A;  . HERNIA REPAIR    . KNEE ARTHROSCOPY WITH MEDIAL MENISECTOMY Left 11/06/2012    Procedure: KNEE ARTHROSCOPY WITH MEDIAL MENISECTOMY;  Surgeon: Carole Civil, MD;  Location: [facility];  Service: Orthopedics;  Laterality: Left;  . Lipoma removal     Stomach  . LUMBAR LAMINECTOMY/DECOMPRESSION MICRODISCECTOMY Left 08/30/2013   Procedure: LUMBAR LAMINECTOMY/DECOMPRESSION MICRODISCECTOMY LEFT  LUMBAR TWO THREE;  Surgeon: Otilio Connors, MD;  Location: [place] [facility];  Service: Neurosurgery;  Laterality: Left;  . SHOULDER SURGERY     Rght-rotator cuff  . TOOTH EXTRACTION      Allergies Patient has no known allergies.  Family History  Problem Relation Age of Onset  . Heart disease Mother   . Hyperlipidemia Mother   . Hypertension Mother   . Depression Mother   . Diabetes Mother   . Rectal cancer Mother   . Cancer Mother   . Heart disease Father   . Hypertension Father   . Hyperlipidemia Father   . Diabetes Father   . Cancer Father   . Heart disease Sister   . Hyperlipidemia Sister   . Hypertension Sister   . Diabetes Sister   . Diabetes Brother   . Heart disease Sister   . Hyperlipidemia Sister   . Hypertension Sister   . Heart disease Sister   . Hyperlipidemia Sister   . Hypertension Sister   . Diabetes Sister   . Diabetes Brother     Social History Social History   Tobacco Use  . Smoking status: Former Smoker    Packs/day: 2.50    Years: 44.00    Pack years: 110.00    Types: Cigarettes    Start date: 05/07/1955    Quit date: 10/01/1999    Years since quitting: 19.9  . Smokeless tobacco: Never Used  Substance Use Topics  . Alcohol use: No    Alcohol/week: 0.0 standard drinks  . Drug use: No    Review of Systems  Constitutional: No fever/chills Eyes: No visual changes. ENT: No sore throat. Cardiovascular: Denies chest pain. Positive LE swelling.  Respiratory: Positive shortness of breath. Gastrointestinal: No abdominal pain.  No nausea, no vomiting.  No diarrhea.  No constipation. Genitourinary: Negative for dysuria. Musculoskeletal:  Negative for back pain. Skin: Negative for rash. Neurological: Negative for headaches, focal weakness or numbness.  10-point ROS otherwise negative.  ____________________________________________   PHYSICAL EXAM:  VITAL SIGNS: ED Triage Vitals  Enc Vitals Group     BP 09/20/19 1019 (!) 174/142     Pulse Rate 09/20/19 1019 (!) 113     Resp 09/20/19 1019 (!) 28     Temp 09/20/19 1019 98.3 F (36.8 C)     Temp Source 09/20/19 1019 Oral     SpO2 09/20/19 1018 99 %     Weight 09/20/19 1020 196 lb 3.4 oz (89 kg)     Height 09/20/19 1020 4' 11"  (1.499 m)   Constitutional: Alert and oriented.  Patient able to provide a brief history but is very SOB with increased WOB.  Eyes: Conjunctivae are normal.  Head: Atraumatic. Nose: No congestion/rhinnorhea. Mouth/Throat: Mucous membranes are moist.   Neck: No stridor.  Cardiovascular: Tachycardia. Good peripheral circulation. Grossly normal heart sounds.  Respiratory: Increased respiratory effort.  No retractions. Lungs with rales at the bases bilaterally.  Gastrointestinal: Soft and nontender. No distention.  Musculoskeletal: 4+ pitting edema in the bilateral lower extremities Neurologic:  Normal speech and language. No gross focal neurologic deficits are appreciated.  Skin:  Skin is warm and dry. Bilateral venous stasis dermatitis in the lower extremities.    ____________________________________________   LABS (all labs ordered are listed, but only abnormal results are displayed)  Labs Reviewed  COMPREHENSIVE METABOLIC PANEL - Abnormal; Notable for the following components:      Result Value   Sodium 134 (*)    Chloride 97 (*)    Glucose, Bld 227 (*)    Calcium 8.8 (*)    All other components within normal limits  BRAIN NATRIURETIC PEPTIDE - Abnormal; Notable for the following components:   B Natriuretic Peptide 233.0 (*)    All other components within normal limits  CBC WITH DIFFERENTIAL/PLATELET - Abnormal; Notable for the  following components:   RDW 15.6 (*)    All other components within normal limits  TROPONIN I (HIGH SENSITIVITY) - Abnormal; Notable for the following components:   Troponin I (High Sensitivity) 77 (*)    All other components within normal limits  RESPIRATORY PANEL BY RT PCR (FLU A&B, COVID)  POC SARS CORONAVIRUS 2 AG -  ED  TROPONIN I (HIGH SENSITIVITY)   ____________________________________________  EKG   EKG Interpretation  Date/Time:  Monday September 20 2019 10:28:00 EST Ventricular Rate:  169 PR Interval:    QRS Duration: 82 QT Interval:  284 QTC Calculation: 477 R Axis:   108 Text Interpretation: Sinus tachycardia Right axis deviation Anteroseptal infarct, old Artifact in lead(s) II III aVF V3 V4 V5 V6 No STEMI Confirmed by Nanda Quinton [id number]) on 09/20/2019 10:54:56 AM       ____________________________________________  RADIOLOGY  DG Chest Portable 1 View  Result Date: 09/20/2019 CLINICAL DATA:  Shortness of breath EXAM: PORTABLE CHEST 1 VIEW COMPARISON:  10/05/2015 FINDINGS: Leads overlie the chest particularly the left chest. Cardiomediastinal contours remain enlarged. Increased retrocardiac density is noted. Increased interstitial markings are noted bilaterally diffusely throughout the chest. Visualized skeletal structures are unremarkable. Upper abdominal bowel gas projects over the left hemidiaphragm on this AP lordotic view. IMPRESSION: 1. Diffuse interstitial prominence likely edema. 2. Increase density in the retrocardiac region may represent atelectasis or developing infection. 3. Bowel gas pattern unusual with some bowel loops appearing to project above the left hemidiaphragm, this is of uncertain significance though could be related to lordotic positioning, consider further evaluation with abdominal radiographs. Electronically Signed   By: Zetta Bills M.D.   On: 09/20/2019 10:55    ____________________________________________   PROCEDURES  Procedure(s)  performed:   Procedures  CRITICAL CARE Performed by: Margette Fast Total critical care time: 35 minutes Critical care time was exclusive of separately billable procedures and treating other patients. Critical care was necessary to treat or prevent imminent or life-threatening deterioration. Critical care was time spent personally by me on the following activities: development of treatment plan with patient and/or surrogate as well as nursing, discussions with consultants, evaluation of patient's response to treatment, examination of patient, obtaining history from patient or surrogate, ordering and performing treatments and interventions, ordering and review of laboratory studies, ordering and review of radiographic studies, pulse oximetry and re-evaluation of patient's condition.  Nanda Quinton, MD Emergency Medicine  ____________________________________________   INITIAL IMPRESSION / ASSESSMENT AND PLAN / ED COURSE  Pertinent labs &  imaging results that were available during my care of the patient were reviewed by me and considered in my medical decision making (see chart for details).   Patient presents to the emergency department for evaluation of acute respiratory distress.  He has no signs or symptoms of infection to highly suspect COVID-19.  His presentation is much more consistent with acute volume overload/pulmonary edema.  Will send rapid Covid testing but plan to start the patient on BiPAP and give Lasix along with nitroglycerin.   12:20 PM  Patient reassessed and looks very comfortable on BiPAP.  His COVID-19 PCR is also negative in addition to negative antigen test.  I do feel comfortable continuing him on BiPAP at this time.  He is diuresing well with Lasix.  Heart rate downtrending now into more the 130s as opposed to 170s when he arrived.  We will continue to follow this to ensure his heart rate continues to downtrend.  Labs show troponin elevated to 77 which I suspect is demand  related to CHF exacerbation.  Chest x-ray shows likely edema pattern. Plan for admit.   Discussed patient's case with [facility], Dr. Manuella Ghazi to request admission. Patient and family (if present) updated with plan. Care transferred to [facility] service.  I reviewed all nursing notes, vitals, pertinent old records, EKGs, labs, imaging (as available).  ____________________________________________  FINAL CLINICAL IMPRESSION(S) / ED DIAGNOSES  Final diagnoses:  Acute pulmonary edema ([place])  Hypoxia    MEDICATIONS GIVEN DURING THIS VISIT:  Medications  furosemide (LASIX) injection 60 mg (60 mg Intravenous Given 09/20/19 1034)  nitroGLYCERIN (NITROSTAT) SL tablet 0.4 mg (0.4 mg Sublingual Given 09/20/19 1034)   Note:  This document was prepared using Dragon voice recognition software and may include unintentional dictation errors.  Nanda Quinton, MD, [facility] Emergency Medicine    Stephine Langbehn, [facility], MD 09/20/19 1323

## 2019-09-20 NOTE — ED Notes (Signed)
Date and time results received: 09/20/19 1345 (use smartphrase ".now" to insert current time)  Test: trop Critical Value: 270  Name of Provider Notified: shah  Orders Received? Or Actions Taken?: see chart

## 2019-09-20 NOTE — ED Triage Notes (Signed)
Patient presents to the ED via [facility] due to shortness of breath for one day.  Patient has history of COPD and CHF is not on oxygen at home.  Patient original sats 88% room air.  Patient is hypertensive, with no fever per EMS.

## 2019-09-20 NOTE — H&P (Signed)
History and Physical    SAID RUEB TDD:[id number] DOB: 1946/06/15 DOA: 09/20/2019  PCP: [name], MD   Patient coming from: Home via EMS  Chief Complaint:   HPI: Michael [name] is a 73 y.o. male with medical history significant for type 2 diabetes, chronic pain, diastolic CHF with LVEF 62-37% noted in 2017, lymphedema, hypertension, hypothyroidism, dyslipidemia, GERD, and NASH who presented to the ED via EMS with shortness of breath over the last 2 days.  He does not wear home oxygen and was noted to be hypoxemic and hypertensive with EMS.  Patient states that he has been noncompliant with his home Bumex and Zaroxolyn on account of the fact that he has to urinate quite often.  He states that his recent Coreg medication was also changed by his cardiologist Dr. Harl Bowie and it is uncertain if he has been taking this as well.  He denies any fevers or chills, cough or chest pain.  Patient was noted to be 88% on room air per EMS report.  He is noted to have persistent lower extremity edema.   ED Course: Vital signs demonstrate hypertension with persistent sinus tachycardia that has been noted on EKG.  His heart rate remains in the 130 bpm range.  He denies any chest pain or significant shortness of breath and has been urinating quite frequently after being given Lasix 60 mg along with some nitroglycerin in ED.  1 view chest x-ray with interstitial edema noted and BNP is 233.  High-sensitivity troponin 77.  Sodium 134 glucose 227.  EKG was sinus tachycardia as noted.  Weight is currently 196 pounds which is near her baseline.  Covid testing has returned negative.  He has been placed on BiPAP FiO2 50% for respiratory support.  Review of Systems: All others reviewed and otherwise negative except as noted above.  Past Medical History:  Diagnosis Date  . Arthritis   . Congestive heart disease ([place]) 11/2015  . COPD (chronic obstructive pulmonary disease) ([place])   . DDD (degenerative disc  disease), lumbosacral   . Depression   . Diabetic neuropathy ([place])   . Diastolic dysfunction   . Essential hypertension   . GERD (gastroesophageal reflux disease)   . Hyperlipidemia   . Iron deficiency anemia   . Left knee DJD   . Lumbar spinal stenosis   . Lymphedema 11/2015   BOTH LEGS  . NASH (nonalcoholic steatohepatitis)   . Peripheral edema   . Peripheral edema 10/05/2015  . Type 2 diabetes mellitus ([place])     Past Surgical History:  Procedure Laterality Date  . BIOPSY N/A 07/21/2014   Procedure: BIOPSY;  Surgeon: Daneil Dolin, MD;  Location: [facility];  Service: Endoscopy;  Laterality: N/A;  . CATARACT EXTRACTION W/PHACO Right 07/08/2016   Procedure: CATARACT EXTRACTION PHACO AND INTRAOCULAR LENS PLACEMENT RIGHT EYE;  Surgeon: Tonny Branch, MD;  Location: [facility];  Service: Ophthalmology;  Laterality: Right;  CDE: 9.01  . CATARACT EXTRACTION W/PHACO Left 07/22/2016   Procedure: CATARACT EXTRACTION PHACO AND INTRAOCULAR LENS PLACEMENT LEFT EYE CDE=10.69;  Surgeon: Tonny Branch, MD;  Location: [facility];  Service: Ophthalmology;  Laterality: Left;  left -   . COLONOSCOPY  2011   [facility]: normal colon, normal distal ileum  . COLONOSCOPY WITH PROPOFOL N/A 07/21/2014   Procedure: ATTEMPTED COLONOSCOPY WITH PROPOFOL-HAD TO STOP DUE TO BRADYCARDIA;  Surgeon: Daneil Dolin, MD;  Location: [facility];  Service: Endoscopy;  Laterality: N/A;  . EGD with enteroscopy  2011   [facility]: normal esophagus and stomach. Normal duodenum, jejunum. No evidence of AVMs.   . ESOPHAGOGASTRODUODENOSCOPY (EGD) WITH PROPOFOL N/A 07/21/2014   Procedure: ESOPHAGOGASTRODUODENOSCOPY (EGD) WITH PROPOFOL;  Surgeon: Daneil Dolin, MD;  Location: [facility];  Service: Endoscopy;  Laterality: N/A;  . HERNIA REPAIR    . KNEE ARTHROSCOPY WITH MEDIAL MENISECTOMY Left 11/06/2012   Procedure: KNEE ARTHROSCOPY WITH MEDIAL MENISECTOMY;  Surgeon: Carole Civil, MD;  Location: [facility];  Service: Orthopedics;  Laterality: Left;  .  Lipoma removal     Stomach  . LUMBAR LAMINECTOMY/DECOMPRESSION MICRODISCECTOMY Left 08/30/2013   Procedure: LUMBAR LAMINECTOMY/DECOMPRESSION MICRODISCECTOMY LEFT  LUMBAR TWO THREE;  Surgeon: Otilio Connors, MD;  Location: [place] [facility];  Service: Neurosurgery;  Laterality: Left;  . SHOULDER SURGERY     Rght-rotator cuff  . TOOTH EXTRACTION       reports that he quit smoking about 19 years ago. His smoking use included cigarettes. He started smoking about 64 years ago. He has a 110.00 pack-year smoking history. He has never used smokeless tobacco. He reports that he does not drink alcohol or use drugs.  No Known Allergies  Family History  Problem Relation Age of Onset  . Heart disease Mother   . Hyperlipidemia Mother   . Hypertension Mother   . Depression Mother   . Diabetes Mother   . Rectal cancer Mother   . Cancer Mother   . Heart disease Father   . Hypertension Father   . Hyperlipidemia Father   . Diabetes Father   . Cancer Father   . Heart disease Sister   . Hyperlipidemia Sister   . Hypertension Sister   . Diabetes Sister   . Diabetes Brother   . Heart disease Sister   . Hyperlipidemia Sister   . Hypertension Sister   . Heart disease Sister   . Hyperlipidemia Sister   . Hypertension Sister   . Diabetes Sister   . Diabetes Brother     Prior to Admission medications   Medication Sig Start Date End Date Taking? Authorizing Provider  aspirin EC 81 MG tablet Take 81 mg by mouth daily.    [provider]  B-D ULTRAFINE III SHORT PEN 31G X 8 MM MISC USE AS DIRECTED DAILY WITH LANTUS. 05/17/16   [name], MD  benazepril (LOTENSIN) 40 MG tablet TAKE ONE TABLET BY MOUTH DAILY. 05/27/16   [name], MD  Blood Glucose Monitoring Suppl (BLOOD GLUCOSE SYSTEM PAK) KIT Please dispense based on patient and insurance preference. Use as directed to monitor FSBS 2x daily. Dx: E11.9 08/20/18   [name], MD  bumetanide (BUMEX) 2 MG tablet TAKE 1&1/2  TABLETS BY MOUTH TWICE DAILY. Patient taking differently: TAKE 2 MG (1 TABLET) TABLETS BY MOUTH TWICE DAILY. 06/17/16   [name], MD  carvedilol (COREG) 6.25 MG tablet Take 1 tablet (6.25 mg total) by mouth 2 (two) times daily. 09/02/19   [name], MD  diclofenac sodium (VOLTAREN) 1 % GEL Apply 4 g topically 4 (four) times daily as needed (pain). 03/06/17   [name], MD  Ferrous Sulfate (IRON) 325 (65 FE) MG TABS Take 1 tablet by mouth daily.     [provider]  gabapentin (NEURONTIN) 400 MG capsule Take 1 capsule (400 mg total) by mouth 3 (three) times daily. 03/04/18   [name], MD  Glucose Blood (BLOOD GLUCOSE TEST STRIPS) STRP Please dispense based on patient and  insurance preference. Use as directed to monitor FSBS 2x daily. Dx: E11.9 08/20/18   [name], MD  Insulin Glargine (LANTUS SOLOSTAR) 100 UNIT/ML Solostar Pen Inject 5 Units into the skin daily at 10 pm. 03/06/18   [name], MD  Lancets MISC Please dispense based on patient and insurance preference. Use as directed to monitor FSBS 2x daily. Dx: E11.9 08/20/18   [name], MD  levothyroxine (SYNTHROID, LEVOTHROID) 100 MCG tablet TAKE 1 TABLET BY MOUTH DAILY BEFORE BREAKFAST. 07/06/18   Susy Frizzle, MD  magnesium oxide (MAG-OX) 400 MG tablet Take 1 tablet (400 mg total) by mouth 2 (two) times daily. 11/19/17   Imogene Burn, PA-C  metFORMIN (GLUCOPHAGE) 1000 MG tablet TAKE ONE TABLET BY MOUTH TWICE DAILY WITH A MEAL. 11/07/15   [name], MD  metolazone (ZAROXOLYN) 2.5 MG tablet TAKE EVERY Tuesday, Thursday & Saturday. 12/09/18   [name], MD  nystatin (MYCOSTATIN/NYSTOP) powder Apply topically 4 (four) times daily. 02/28/17   [name], MD  nystatin cream (MYCOSTATIN) APPLY TO AFFECTED AREA TWICE DAILY. 03/04/18   Blasdell, Modena Nunnery, MD  oxyCODONE-acetaminophen (PERCOCET) 7.5-325 MG tablet TAKE 1 TABLET BY MOUTH EVERY 6 HOURS AS NEEDED FOR SEVERE  PAIN. 06/30/18   Susy Frizzle, MD  pantoprazole (PROTONIX) 40 MG tablet Take 1 tablet (40 mg total) by mouth daily. 11/04/17   [name], MD  potassium chloride (K-DUR) 10 MEQ tablet Take 1 tablet (10 mEq total) by mouth 2 (two) times daily. 11/19/17   Imogene Burn, PA-C  pravastatin (PRAVACHOL) 40 MG tablet TAKE ONE TABLET BY MOUTH DAILY. Patient taking differently: TAKE ONE TABLET BY MOUTH DAILY IN THE EVENING 05/21/16   [name], MD  traZODone (DESYREL) 100 MG tablet TAKE 1 TABLET BY MOUTH AT BEDTIME AS NEEDED FOR SLEEP. 11/18/18   [name], MD  triamcinolone cream (KENALOG) 0.1 % Apply 1 application topically 2 (two) times daily. To arms/hand 11/18/18   [name], MD    Physical Exam: Vitals:   09/20/19 1059 09/20/19 1100 09/20/19 1115 09/20/19 1130  BP:  (!) 177/126  (!) 162/117  Pulse: (!) 146 (!) 143 (!) 139 (!) 133  Resp: (!) 21 (!) 24 (!) 26 20  Temp:      TempSrc:      SpO2: 99% 98% 100% 100%  Weight:      Height:        Constitutional: NAD, calm, comfortable Vitals:   09/20/19 1059 09/20/19 1100 09/20/19 1115 09/20/19 1130  BP:  (!) 177/126  (!) 162/117  Pulse: (!) 146 (!) 143 (!) 139 (!) 133  Resp: (!) 21 (!) 24 (!) 26 20  Temp:      TempSrc:      SpO2: 99% 98% 100% 100%  Weight:      Height:       Eyes: lids and conjunctivae normal ENMT: Mucous membranes are moist.  Neck: normal, supple Respiratory: clear to auscultation bilaterally. Normal respiratory effort. No accessory muscle use.  Currently on BiPAP FiO2 50%. Cardiovascular: Regular rate and rhythm, no murmurs, tachycardic.  Chronic lower extremity lymphedema noted. Abdomen: no tenderness, no distention. Bowel sounds positive.  Musculoskeletal:  No joint deformity upper and lower extremities.   Skin: Chronic changes related to lymphedema noted. Psychiatric: Normal judgment and insight. Alert and oriented x 3. Normal mood.   Labs on Admission: I have personally reviewed  following labs and imaging studies  CBC: Recent Labs  Lab 09/20/19 1040  WBC 9.0  NEUTROABS 7.3  HGB 13.8  HCT 44.8  MCV 87.8  PLT 382   Basic Metabolic Panel: Recent Labs  Lab 09/20/19 1040  NA 134*  K 4.0  CL 97*  CO2 25  GLUCOSE 227*  BUN 11  CREATININE 0.72  CALCIUM 8.8*   GFR: Estimated Creatinine Clearance: 75.8 mL/min (by C-G formula based on SCr of 0.72 mg/dL). Liver Function Tests: Recent Labs  Lab 09/20/19 1040  AST 23  ALT 21  ALKPHOS 72  BILITOT 0.6  PROT 6.9  ALBUMIN 3.9   No results for input(s): LIPASE, AMYLASE in the last 168 hours. No results for input(s): AMMONIA in the last 168 hours. Coagulation Profile: No results for input(s): INR, PROTIME in the last 168 hours. Cardiac Enzymes: No results for input(s): CKTOTAL, CKMB, CKMBINDEX, TROPONINI in the last 168 hours. BNP (last 3 results) No results for input(s): PROBNP in the last 8760 hours. HbA1C: No results for input(s): HGBA1C in the last 72 hours. CBG: No results for input(s): GLUCAP in the last 168 hours. Lipid Profile: No results for input(s): CHOL, HDL, LDLCALC, TRIG, CHOLHDL, LDLDIRECT in the last 72 hours. Thyroid Function Tests: No results for input(s): TSH, T4TOTAL, FREET4, T3FREE, THYROIDAB in the last 72 hours. Anemia Panel: No results for input(s): VITAMINB12, FOLATE, FERRITIN, TIBC, IRON, RETICCTPCT in the last 72 hours. Urine analysis:    Component Value Date/Time   COLORURINE YELLOW 11/02/2015 1550   APPEARANCEUR HAZY (A) 11/02/2015 1550   LABSPEC 1.010 11/02/2015 1550   PHURINE 7.5 11/02/2015 1550   GLUCOSEU NEGATIVE 11/02/2015 1550   HGBUR LARGE (A) 11/02/2015 1550   BILIRUBINUR NEGATIVE 11/02/2015 1550   KETONESUR NEGATIVE 11/02/2015 1550   PROTEINUR 30 (A) 11/02/2015 1550   UROBILINOGEN 0.2 08/24/2013 1129   NITRITE NEGATIVE 11/02/2015 1550   LEUKOCYTESUR LARGE (A) 11/02/2015 1550    Radiological Exams on Admission: DG Chest Portable 1 View  Result  Date: 09/20/2019 CLINICAL DATA:  Shortness of breath EXAM: PORTABLE CHEST 1 VIEW COMPARISON:  10/05/2015 FINDINGS: Leads overlie the chest particularly the left chest. Cardiomediastinal contours remain enlarged. Increased retrocardiac density is noted. Increased interstitial markings are noted bilaterally diffusely throughout the chest. Visualized skeletal structures are unremarkable. Upper abdominal bowel gas projects over the left hemidiaphragm on this AP lordotic view. IMPRESSION: 1. Diffuse interstitial prominence likely edema. 2. Increase density in the retrocardiac region may represent atelectasis or developing infection. 3. Bowel gas pattern unusual with some bowel loops appearing to project above the left hemidiaphragm, this is of uncertain significance though could be related to lordotic positioning, consider further evaluation with abdominal radiographs. Electronically Signed   By: Zetta Bills M.D.   On: 09/20/2019 10:55    EKG: Independently reviewed.  Sinus tachycardia 169 bpm.  Assessment/Plan Active Problems:   Acute hypoxemic respiratory failure (HCC)    Acute hypoxemic respiratory failure secondary to acute on chronic diastolic congestive heart failure -Patient noted to have elevated BNP as well as findings of interstitial edema -Weight is currently at 196 pounds which is near his baseline -Hold home Bumex and continue with Lasix 80 mg IV twice daily -Continue Zaroxolyn -Monitor strict I's and O's and daily weights -Fluid restriction -Repeat labs -2D echocardiogram with prior on 10/2015 with EF 50-55% -Wean BiPAP and oxygen as tolerated as patient does not wear any home oxygen  Hypertension with tachycardia -Labetalol as needed for heart rate and blood pressure control -Continue home Coreg  Type 2 diabetes -Continue on SSI -Continue home Lantus and home Metformin  Hypothyroidism -Continue Synthroid  Dyslipidemia -Continue statin  GERD -PPI  Chronic  pain -Continue gabapentin and oxycodone  Lymphedema -Coban wraps  DVT prophylaxis: Lovenox Code Status: Full Family Communication: None at bedside Disposition Plan: Admit for IV diuresis and repeat 2D echocardiogram Consults called: None Admission status: Inpatient, stepdown unit   Alita Waldren D Manuella Ghazi DO [facility] Pager [phone]  If 7PM-7AM, please contact night-coverage [email or web address] Password [email or web address]  09/20/2019, 1:37 PM

## 2019-09-20 NOTE — Progress Notes (Signed)
*  PRELIMINARY RESULTS* Echocardiogram 2D Echocardiogram has been performed.  Michael [name] 09/20/2019, 4:37 PM

## 2019-09-20 NOTE — Progress Notes (Signed)
CRITICAL VALUE ALERT  Critical Value:  Troponin 628  Date & Time Notied:  09/20/2019 8375  Provider Notified: Manuella Ghazi  Orders Received/Actions taken: Paged MD, awaiting orders

## 2019-09-20 NOTE — ED Notes (Signed)
Have placed pt on bedpan

## 2019-09-20 NOTE — Progress Notes (Addendum)
ANTICOAGULATION CONSULT NOTE - Initial Consult  Pharmacy Consult for heparin Indication: chest pain/ACS/STEMI  No Known Allergies  Patient Measurements: Height: 4' 11"  (149.9 cm) Weight: 203 lb 0.7 oz (92.1 kg) IBW/kg (Calculated) : 47.7 Heparin Dosing Weight: 68 kg  Vital Signs: Temp: 98.1 F (36.7 C) (12/21 1730) Temp Source: Axillary (12/21 1730) BP: 124/78 (12/21 1800) Pulse Rate: 101 (12/21 1800)  Labs: Recent Labs    09/20/19 1040 09/20/19 1250 09/20/19 1652  HGB 13.8  --   --   HCT 44.8  --   --   PLT 277  --   --   CREATININE 0.72  --   --   TROPONINIHS 77* 270* 628*    Estimated Creatinine Clearance: 77.3 mL/min (by C-G formula based on SCr of 0.72 mg/dL).   Medical History: Past Medical History:  Diagnosis Date  . Arthritis   . Congestive heart disease ([place]) 11/2015  . COPD (chronic obstructive pulmonary disease) ([place])   . DDD (degenerative disc disease), lumbosacral   . Depression   . Diabetic neuropathy ([place])   . Diastolic dysfunction   . Essential hypertension   . GERD (gastroesophageal reflux disease)   . Hyperlipidemia   . Iron deficiency anemia   . Left knee DJD   . Lumbar spinal stenosis   . Lymphedema 11/2015   BOTH LEGS  . NASH (nonalcoholic steatohepatitis)   . Peripheral edema   . Peripheral edema 10/05/2015  . Type 2 diabetes mellitus (HCC)     Medications:  Scheduled:  . aspirin EC  81 mg Oral Daily  . benazepril  40 mg Oral Daily  . carvedilol  6.25 mg Oral BID  . [START ON 09/21/2019] Chlorhexidine Gluconate Cloth  6 each Topical Q0600  . ferrous sulfate  325 mg Oral Daily  . furosemide  80 mg Intravenous Q12H  . gabapentin  400 mg Oral TID  . insulin aspart  0-15 Units Subcutaneous TID WC  . insulin aspart  0-5 Units Subcutaneous QHS  . insulin glargine  5 Units Subcutaneous Q2200  . [START ON 09/21/2019] levothyroxine  100 mcg Oral Q0600  . magnesium oxide  400 mg Oral BID  . metolazone  2.5 mg Oral Daily  .  pantoprazole  40 mg Oral Daily  . potassium chloride  10 mEq Oral BID  . pravastatin  40 mg Oral QPM  . sodium chloride flush  3 mL Intravenous Q12H    Assessment: Patient is a 60 yom with a chief complaint of SOB thought to be from [name] exacerbation. Troponins are elevated, most recently at 628 so pharmacy is consulted to dose heparin. Patient received 40 mg of Lovenox at 1530. Baseline H&H is wnl at 13.8/44.8, plts are also wnl at 277. No anticoagulation noted PTA.    Goal of Therapy:  Heparin level 0.3-0.7 units/ml Monitor platelets by anticoagulation protocol: Yes   Plan:  Give 4000 units bolus x 1 Start heparin infusion at 800 units/hr Check anti-Xa level in 8 hours (~0400 tomorrow, so check with morning labs) and daily while on heparin Continue to monitor H&H and platelets   Thank you,   Eddie Candle, PharmD PGY-1 Pharmacy Resident   Please check amion for clinical pharmacist contact number   09/20/2019,6:59 PM

## 2019-09-20 NOTE — Progress Notes (Signed)
Patient is currently on [facility] with sats of 97%. Patient is in no distress and all vitals are stable. BIPAP is at bedside but is not needed at this time. Will continue to monitor.

## 2019-09-21 DIAGNOSIS — E1149 Type 2 diabetes mellitus with other diabetic neurological complication: Secondary | ICD-10-CM

## 2019-09-21 DIAGNOSIS — I5043 Acute on chronic combined systolic (congestive) and diastolic (congestive) heart failure: Secondary | ICD-10-CM | POA: Insufficient documentation

## 2019-09-21 DIAGNOSIS — G894 Chronic pain syndrome: Secondary | ICD-10-CM

## 2019-09-21 DIAGNOSIS — K7581 Nonalcoholic steatohepatitis (NASH): Secondary | ICD-10-CM

## 2019-09-21 LAB — GLUCOSE, CAPILLARY
Glucose-Capillary: 149 mg/dL — ABNORMAL HIGH (ref 70–99)
Glucose-Capillary: 168 mg/dL — ABNORMAL HIGH (ref 70–99)
Glucose-Capillary: 159 mg/dL — ABNORMAL HIGH (ref 70–99)
Glucose-Capillary: 212 mg/dL — ABNORMAL HIGH (ref 70–99)

## 2019-09-21 LAB — CBC
HCT: 39.8 % (ref 39.0–52.0)
Hemoglobin: 12.3 g/dL — ABNORMAL LOW (ref 13.0–17.0)
MCH: 26.6 pg (ref 26.0–34.0)
MCHC: 30.9 g/dL (ref 30.0–36.0)
MCV: 86.1 fL (ref 80.0–100.0)
Platelets: 217 10*3/uL (ref 150–400)
RBC: 4.62 MIL/uL (ref 4.22–5.81)
RDW: 15.4 % (ref 11.5–15.5)
WBC: 6.9 10*3/uL (ref 4.0–10.5)
nRBC: 0 % (ref 0.0–0.2)

## 2019-09-21 LAB — BASIC METABOLIC PANEL
Anion gap: 12 (ref 5–15)
BUN: 12 mg/dL (ref 8–23)
CO2: 33 mmol/L — ABNORMAL HIGH (ref 22–32)
Calcium: 9.2 mg/dL (ref 8.9–10.3)
Chloride: 92 mmol/L — ABNORMAL LOW (ref 98–111)
Creatinine, Ser: 0.88 mg/dL (ref 0.61–1.24)
GFR calc Af Amer: >60 mL/min (ref >60)
GFR calc non Af Amer: >60 mL/min (ref >60)
Glucose, Bld: 128 mg/dL — ABNORMAL HIGH (ref 70–99)
Potassium: 4.2 mmol/L (ref 3.5–5.1)
Sodium: 137 mmol/L (ref 135–145)

## 2019-09-21 LAB — HEPARIN LEVEL (UNFRACTIONATED)
Heparin Unfractionated: 0.32 IU/mL (ref 0.30–0.70)
Heparin Unfractionated: 0.13 IU/mL — ABNORMAL LOW (ref 0.30–0.70)

## 2019-09-21 LAB — MAGNESIUM: Magnesium: 1.6 mg/dL — ABNORMAL LOW (ref 1.7–2.4)

## 2019-09-21 LAB — TROPONIN I (HIGH SENSITIVITY)
Troponin I (High Sensitivity): 744 ng/L (ref <18)
Troponin I (High Sensitivity): 663 ng/L (ref <18)

## 2019-09-21 MED ORDER — HEPARIN BOLUS VIA INFUSION
2000.0000 [IU] | Freq: Once | INTRAVENOUS | Status: AC
  Administered 2019-09-21: 17:00:00 2000 [IU] via INTRAVENOUS
  Filled 2019-09-21: qty 2000

## 2019-09-21 MED ORDER — MAGNESIUM SULFATE 2 GM/50ML IV SOLN
2.0000 g | Freq: Once | INTRAVENOUS | Status: AC
  Administered 2019-09-21: 11:00:00 2 g via INTRAVENOUS
  Filled 2019-09-21: qty 50

## 2019-09-21 MED ORDER — ORAL CARE MOUTH RINSE
15.0000 mL | Freq: Two times a day (BID) | OROMUCOSAL | Status: DC
  Administered 2019-09-21 – 2019-09-23 (×4): 15 mL via OROMUCOSAL

## 2019-09-21 NOTE — Progress Notes (Signed)
ANTICOAGULATION CONSULT NOTE -  Pharmacy Consult for heparin Indication: chest pain/ACS/STEMI  No Known Allergies  Patient Measurements: Height: 4' 11"  (149.9 cm) Weight: 194 lb 14.2 oz (88.4 kg) IBW/kg (Calculated) : 47.7 Heparin Dosing Weight: 68 kg  Vital Signs: Temp: 98.9 F (37.2 C) (12/22 0400) Temp Source: Axillary (12/22 0400) BP: 126/99 (12/22 0615) Pulse Rate: 100 (12/22 0615)  Labs: Recent Labs    09/20/19 1040 09/20/19 1905 09/21/19 0007 09/21/19 0209 09/21/19 0416 09/21/19 1451  HGB 13.8  --   --   --  12.3*  --   HCT 44.8  --   --   --  39.8  --   PLT 277  --   --   --  217  --   HEPARINUNFRC  --   --   --   --  0.32 0.13*  CREATININE 0.72  --   --   --  0.88  --   TROPONINIHS 77* 693* 744* 663*  --   --     Estimated Creatinine Clearance: 68.7 mL/min (by C-G formula based on SCr of 0.88 mg/dL).   Medical History: Past Medical History:  Diagnosis Date  . Arthritis   . Congestive heart disease ([place]) 11/2015  . COPD (chronic obstructive pulmonary disease) ([place])   . DDD (degenerative disc disease), lumbosacral   . Depression   . Diabetic neuropathy ([place])   . Diastolic dysfunction   . Essential hypertension   . GERD (gastroesophageal reflux disease)   . Hyperlipidemia   . Iron deficiency anemia   . Left knee DJD   . Lumbar spinal stenosis   . Lymphedema 11/2015   BOTH LEGS  . NASH (nonalcoholic steatohepatitis)   . Peripheral edema   . Peripheral edema 10/05/2015  . Type 2 diabetes mellitus (HCC)     Medications:  Scheduled:  . aspirin EC  81 mg Oral Daily  . benazepril  40 mg Oral Daily  . carvedilol  6.25 mg Oral BID  . Chlorhexidine Gluconate Cloth  6 each Topical Q0600  . ferrous sulfate  325 mg Oral Daily  . furosemide  80 mg Intravenous Q12H  . gabapentin  400 mg Oral TID  . insulin aspart  0-15 Units Subcutaneous TID WC  . insulin aspart  0-5 Units Subcutaneous QHS  . insulin glargine  5 Units Subcutaneous Q2200  .  levothyroxine  100 mcg Oral Q0600  . magnesium oxide  400 mg Oral BID  . metolazone  2.5 mg Oral Daily  . pantoprazole  40 mg Oral Daily  . potassium chloride  10 mEq Oral BID  . pravastatin  40 mg Oral QPM  . sodium chloride flush  3 mL Intravenous Q12H    Assessment: Patient is a 61 yom with a chief complaint of SOB thought to be from [facility] exacerbation. Troponins are elevated, most recently at 628 so pharmacy is consulted to dose heparin.  No anticoagulation noted PTA.   Heparin level subtherapeutic at 0.13   Goal of Therapy:  Heparin level 0.3-0.7 units/ml Monitor platelets by anticoagulation protocol: Yes   Plan:  Rebolus 2000 units IV x 1. Increase heparin infusion to 1000 units/hr Check anti-Xa level in 8 hours and daily while on heparin Continue to monitor H&H and platelets   Margot Ables, PharmD Clinical Pharmacist 09/21/2019 3:47 PM

## 2019-09-21 NOTE — Progress Notes (Addendum)
PROGRESS NOTE  Michael [name] HKV:[id number] DOB: 1946-08-16 DOA: 09/20/2019 PCP: [name], MD  Brief History:  73 year old male with a history of diastolic CHF, depression, diabetes mellitus type 2, NASH, hypertension, morbid obesity, hypothyroidism presenting with 2-day history of shortness of breath and lower extremity edema.  He is also been complaining of orthopnea type symptoms.  Upon EMS arrival, the patient was noted to have oxygen saturation of 88% on room air.  He was placed on BiPAP in the emergency department with improvement of his respiratory status.  The patient had denied any fevers, chills, chest pain, coughing, hemoptysis, nausea, vomiting, direct abdominal pain.  Unfortunately, the patient states that he has not been completely compliant with his diuretic regimen.  In addition, the patient appears to have poor insight regarding his antihypertensive and cardiac medications.  Furthermore, he has had some indiscretion regarding his diet and fluid intake. In the emergency department, the patient was afebrile hemodynamically stable.  He was initially tachycardic in the 150s.  He has subsequently been weaned off of BiPAP.  He was initially started on intravenous furosemide.  Initial troponin was 270 and peaked at 744.  The patient was empirically started on IV heparin.  BNP was 233.  EKG shows sinus rhythm with T wave inversion V3-V6.  Assessment/Plan: Acute respiratory failure with hypoxia -Secondary to pulmonary edema/CHF -Initially placed on BiPAP -Currently on nasal cannula at 2 L -Continue to wean oxygen for saturation greater 92% -personally reviewed CXR--increased interstitial markings  Acute systolic CHF -56/43/3295 echo EF <20%, severe HK, AK in the distal one third LV, mild TR, trivial MR -Continue IV furosemide -Daily weights -Accurate I's and O's -According to 09/02/2019 office cardiology note by Dr. Harl Bowie, the patient has gained 11 pounds in the  past month. -Patient endorses poor compliance with diuretics as well as indiscretion with fluid intake -cardiology consult -continue carvedilol and benazepril  Essential hypertension -Restart carvedilol, benazepril  Elevated troponin -Secondary to decompensated CHF -No chest pain presently -Discontinue heparin -personally reviewed EKG--sinus, nonspecific T wave change  Diabetes mellitus type 2 -08/03/2019 hemoglobin A1c 6.8 -Continue reduced dose -NovoLog sliding scale  Hypothyroidism -Continue Synthroid  Hyperlipidemia -Continue statin  Diabetic polyneuropathy -Continue gabapentin  Venous Stasis dermatitis -may benefit from UNNA boots  Hypomagnesemia -replete     Disposition Plan:   Home in 2-3 days  Family Communication:  No Family at bedside  Consultants: cardiology   Code Status:  FULL   DVT Prophylaxis:  IV heparin>>>lovenox   Procedures: As Listed in Progress Note Above  Antibiotics: None       Subjective: Patient denies fevers, chills, headache, chest pain, dyspnea, nausea, vomiting, diarrhea, abdominal pain, dysuria, hematuria, hematochezia, and melena.   Objective: Vitals:   09/21/19 0530 09/21/19 0545 09/21/19 0600 09/21/19 0615  BP: (!) 150/106 (!) 148/132 137/82 (!) 126/99  Pulse: (!) 106 (!) 105 (!) 103 100  Resp: (!) 21 20 16 15   Temp:      TempSrc:      SpO2: 96% 96% 96% 97%  Weight:      Height:        Intake/Output Summary (Last 24 hours) at 09/21/2019 0740 Last data filed at 09/21/2019 1884 Gross per 24 hour  Intake 122.83 ml  Output 5600 ml  Net -5477.17 ml   Weight change:  Exam:   General:  Pt is alert, follows commands appropriately, not in acute distress  HEENT: No icterus,  No thrush, No neck mass, /AT  Cardiovascular: RRR, S1/S2, no rubs, no gallops  Respiratory: bibasilar rales. No wheeze  Abdomen: Soft/+BS, non tender, non distended, no guarding  Extremities: 2 + LE edema, No lymphangitis, No  petechiae, No rashes, no synovitis        Data Reviewed: I have personally reviewed following labs and imaging studies Basic Metabolic Panel: Recent Labs  Lab 09/20/19 1040 09/21/19 0416  NA 134* 137  K 4.0 4.2  CL 97* 92*  CO2 25 33*  GLUCOSE 227* 128*  BUN 11 12  CREATININE 0.72 0.88  CALCIUM 8.8* 9.2  MG  --  1.6*   Liver Function Tests: Recent Labs  Lab 09/20/19 1040  AST 23  ALT 21  ALKPHOS 72  BILITOT 0.6  PROT 6.9  ALBUMIN 3.9   No results for input(s): LIPASE, AMYLASE in the last 168 hours. No results for input(s): AMMONIA in the last 168 hours. Coagulation Profile: No results for input(s): INR, PROTIME in the last 168 hours. CBC: Recent Labs  Lab 09/20/19 1040 09/21/19 0416  WBC 9.0 6.9  NEUTROABS 7.3  --   HGB 13.8 12.3*  HCT 44.8 39.8  MCV 87.8 86.1  PLT 277 217   Cardiac Enzymes: No results for input(s): CKTOTAL, CKMB, CKMBINDEX, TROPONINI in the last 168 hours. BNP: Invalid input(s): POCBNP CBG: Recent Labs  Lab 09/20/19 1721 09/20/19 2051  GLUCAP 130* 130*   HbA1C: No results for input(s): HGBA1C in the last 72 hours. Urine analysis:    Component Value Date/Time   COLORURINE YELLOW 11/02/2015 1550   APPEARANCEUR HAZY (A) 11/02/2015 1550   LABSPEC 1.010 11/02/2015 1550   PHURINE 7.5 11/02/2015 1550   GLUCOSEU NEGATIVE 11/02/2015 1550   HGBUR LARGE (A) 11/02/2015 1550   BILIRUBINUR NEGATIVE 11/02/2015 1550   KETONESUR NEGATIVE 11/02/2015 1550   PROTEINUR 30 (A) 11/02/2015 1550   UROBILINOGEN 0.2 08/24/2013 1129   NITRITE NEGATIVE 11/02/2015 1550   LEUKOCYTESUR LARGE (A) 11/02/2015 1550   Sepsis Labs: @LABRCNTIP (procalcitonin:4,lacticidven:4) ) Recent Results (from the past 240 hour(s))  Respiratory Panel by RT PCR (Flu A&B, Covid) - Nasopharyngeal Swab     Status: None   Collection Time: 09/20/19 10:27 AM   Specimen: Nasopharyngeal Swab  Result Value Ref Range Status   SARS Coronavirus 2 by RT PCR NEGATIVE NEGATIVE  Final    Comment: (NOTE) SARS-CoV-2 target nucleic acids are NOT DETECTED. The SARS-CoV-2 RNA is generally detectable in upper respiratoy specimens during the acute phase of infection. The lowest concentration of SARS-CoV-2 viral copies this assay can detect is 131 copies/mL. A negative result does not preclude SARS-Cov-2 infection and should not be used as the sole basis for treatment or other patient management decisions. A negative result may occur with  improper specimen collection/handling, submission of specimen other than nasopharyngeal swab, presence of viral mutation(s) within the areas targeted by this assay, and inadequate number of viral copies (<131 copies/mL). A negative result must be combined with clinical observations, patient history, and epidemiological information. The expected result is Negative. Fact Sheet for Patients:  PinkCheek.be Fact Sheet for Healthcare Providers:  GravelBags.it This test is not yet ap proved or cleared by the [place] FDA and  has been authorized for detection and/or diagnosis of SARS-CoV-2 by FDA under an Emergency Use Authorization (EUA). This EUA will remain  in effect (meaning this test can be used) for the duration of the COVID-19 declaration under Section 564(b)(1) of the Act, 21 U.S.C. section 360bbb-3(b)(1),  unless the authorization is terminated or revoked sooner.    Influenza A by PCR NEGATIVE NEGATIVE Final   Influenza B by PCR NEGATIVE NEGATIVE Final    Comment: (NOTE) The Xpert Xpress SARS-CoV-2/FLU/RSV assay is intended as an aid in  the diagnosis of influenza from Nasopharyngeal swab specimens and  should not be used as a sole basis for treatment. Nasal washings and  aspirates are unacceptable for Xpert Xpress SARS-CoV-2/FLU/RSV  testing. Fact Sheet for Patients: PinkCheek.be Fact Sheet for Healthcare Providers:  GravelBags.it This test is not yet approved or cleared by the [place] FDA and  has been authorized for detection and/or diagnosis of SARS-CoV-2 by  FDA under an Emergency Use Authorization (EUA). This EUA will remain  in effect (meaning this test can be used) for the duration of the  Covid-19 declaration under Section 564(b)(1) of the Act, 21  U.S.C. section 360bbb-3(b)(1), unless the authorization is  terminated or revoked. Performed at [facility], [address], [place], [place] [address]   MRSA PCR Screening     Status: None   Collection Time: 09/20/19  2:33 PM   Specimen: Nasal Mucosa; Nasopharyngeal  Result Value Ref Range Status   MRSA by PCR NEGATIVE NEGATIVE Final    Comment:        The GeneXpert MRSA Assay (FDA approved for NASAL specimens only), is one component of a comprehensive MRSA colonization surveillance program. It is not intended to diagnose MRSA infection nor to guide or monitor treatment for MRSA infections. Performed at [facility], [address], [place],  [address]      Scheduled Meds: . aspirin EC  81 mg Oral Daily  . benazepril  40 mg Oral Daily  . carvedilol  6.25 mg Oral BID  . Chlorhexidine Gluconate Cloth  6 each Topical Q0600  . ferrous sulfate  325 mg Oral Daily  . furosemide  80 mg Intravenous Q12H  . gabapentin  400 mg Oral TID  . insulin aspart  0-15 Units Subcutaneous TID WC  . insulin aspart  0-5 Units Subcutaneous QHS  . insulin glargine  5 Units Subcutaneous Q2200  . levothyroxine  100 mcg Oral Q0600  . magnesium oxide  400 mg Oral BID  . metolazone  2.5 mg Oral Daily  . pantoprazole  40 mg Oral Daily  . potassium chloride  10 mEq Oral BID  . pravastatin  40 mg Oral QPM  . sodium chloride flush  3 mL Intravenous Q12H   Continuous Infusions: . sodium chloride    . heparin 800 Units/hr (09/21/19 4315)    Procedures/Studies: DG Chest Portable 1 View  Result Date: 09/20/2019  CLINICAL DATA:  Shortness of breath EXAM: PORTABLE CHEST 1 VIEW COMPARISON:  10/05/2015 FINDINGS: Leads overlie the chest particularly the left chest. Cardiomediastinal contours remain enlarged. Increased retrocardiac density is noted. Increased interstitial markings are noted bilaterally diffusely throughout the chest. Visualized skeletal structures are unremarkable. Upper abdominal bowel gas projects over the left hemidiaphragm on this AP lordotic view. IMPRESSION: 1. Diffuse interstitial prominence likely edema. 2. Increase density in the retrocardiac region may represent atelectasis or developing infection. 3. Bowel gas pattern unusual with some bowel loops appearing to project above the left hemidiaphragm, this is of uncertain significance though could be related to lordotic positioning, consider further evaluation with abdominal radiographs. Electronically Signed   By: Zetta Bills M.D.   On: 09/20/2019 10:55   ECHOCARDIOGRAM COMPLETE  Result Date: 09/20/2019   ECHOCARDIOGRAM REPORT   Patient Name:  [name] Date of Exam: 09/20/2019 Medical Rec #:  [id number]       Height:       59.0 in Accession #:    [id number]      Weight:       196.2 lb Date of Birth:  Oct 14, 1945        BSA:          1.83 m Patient Age:    56 years        BP:           158/136 mmHg Patient Gender: M               HR:           117 bpm. Exam Location:  [facility] Procedure: 2D Echo, Cardiac Doppler and Color Doppler Indications:    CHF-Acute Diastolic 462.70 / J50.09  History:        Patient has prior history of Echocardiogram examinations, most                 recent 10/05/2015. CHF; Risk Factors:Hypertension, Dyslipidemia                 and Diabetes. Patient on BiPAP w/ face mask.  Sonographer:    Alvino Chapel RCS Referring Phys: [id number] [place] D Hauser  1. Left ventricular ejection fraction, by visual estimation, is <20%. The left ventricle has severely decreased function. There is no left ventricular hypertrophy.   2. The left ventricle demonstrates regional wall motion abnormalities.  3. LVEF is approximately 15 to 20% with severe hypokinesis and akinesis if the distal 1/3 of left ventricle.. Only extreme proximal portions of LV thicken during systole. Compared to echo from 2017, LVEF is now severely depressed.  4. Global right ventricle has normal systolic function.The right ventricular size is normal. No increase in right ventricular wall thickness.  5. Left atrial size was normal.  6. Right atrial size was normal.  7. Mild mitral annular calcification.  8. The mitral valve is abnormal. Trivial mitral valve regurgitation.  9. The tricuspid valve is normal in structure. Tricuspid valve regurgitation is mild. 10. The aortic valve is abnormal. Aortic valve regurgitation is not visualized. Mild aortic valve sclerosis without stenosis. 11. The pulmonic valve was normal in structure. Pulmonic valve regurgitation is not visualized. 12. The inferior vena cava is dilated in size with <50% respiratory variability, suggesting right atrial pressure of 15 mmHg. FINDINGS  Left Ventricle: Left ventricular ejection fraction, by visual estimation, is <20%. The left ventricle has severely decreased function. The left ventricle demonstrates regional wall motion abnormalities. There is no left ventricular hypertrophy. LVEF is approximately 15 to 20% with severe hypokinesis and akinesis if the distal 1/3 of left ventricle.. Only extreme proximal portions of LV thicken during systole. Compared to echo from 2017, LVEF is now severely depressed. Right Ventricle: The right ventricular size is normal. No increase in right ventricular wall thickness. Global RV systolic function is has normal systolic function. Left Atrium: Left atrial size was normal in size. Right Atrium: Right atrial size was normal in size Pericardium: There is no evidence of pericardial effusion. Mitral Valve: The mitral valve is abnormal. There is mild thickening of the mitral  valve leaflet(s). Mild mitral annular calcification. Trivial mitral valve regurgitation. MV peak gradient, 6.2 mmHg. Tricuspid Valve: The tricuspid valve is normal in structure. Tricuspid valve regurgitation is mild. Aortic Valve: The aortic valve is abnormal. Aortic valve regurgitation is not visualized. Mild aortic  valve sclerosis is present, with no evidence of aortic valve stenosis. Pulmonic Valve: The pulmonic valve was normal in structure. Pulmonic valve regurgitation is not visualized. Pulmonic regurgitation is not visualized. Aorta: The aortic root is normal in size and structure. Venous: The inferior vena cava is dilated in size with less than 50% respiratory variability, suggesting right atrial pressure of 15 mmHg. IAS/Shunts: No atrial level shunt detected by color flow Doppler.  LEFT VENTRICLE PLAX 2D LVIDd:         4.95 cm LVIDs:         4.45 cm LV PW:         1.08 cm LV IVS:        1.02 cm LVOT diam:     1.90 cm LV SV:         25 ml LV SV Index:   12.88 LVOT Area:     2.84 cm  LV Volumes (MOD) LV area d, A2C:    30.50 cm LV area d, A4C:    32.90 cm LV area s, A2C:    24.20 cm LV area s, A4C:    27.30 cm LV major d, A2C:   8.38 cm LV major d, A4C:   8.29 cm LV major s, A2C:   7.55 cm LV major s, A4C:   7.93 cm LV vol d, MOD A2C: 92.9 ml LV vol d, MOD A4C: 107.0 ml LV vol s, MOD A2C: 69.1 ml LV vol s, MOD A4C: 80.7 ml LV SV MOD A2C:     23.8 ml LV SV MOD A4C:     107.0 ml LV SV MOD BP:      23.9 ml RIGHT VENTRICLE RV S prime:     10.90 cm/s TAPSE (M-mode): 1.8 cm LEFT ATRIUM             Index       RIGHT ATRIUM           Index LA diam:        3.70 cm 2.02 cm/m  RA Area:     14.20 cm LA Vol (A2C):   85.1 ml 46.52 ml/m RA Volume:   34.70 ml  18.97 ml/m LA Vol (A4C):   55.8 ml 30.50 ml/m LA Biplane Vol: 71.6 ml 39.14 ml/m  AORTIC VALVE LVOT Vmax:   67.40 cm/s LVOT Vmean:  46.200 cm/s LVOT VTI:    0.108 m  AORTA Ao Root diam: 3.30 cm MITRAL VALVE MV Area (PHT): 3.91 cm             SHUNTS MV Peak  grad:  6.2 mmHg             Systemic VTI:  0.11 m MV Mean grad:  2.0 mmHg             Systemic Diam: 1.90 cm MV Vmax:       1.24 m/s MV Vmean:      57.3 cm/s MV VTI:        0.22 m MV PHT:        56.26 msec MV Decel Time: 194 msec MV E velocity: 109.00 cm/s 103 cm/s  Dorris Carnes MD Electronically signed by Dorris Carnes MD Signature Date/Time: 09/20/2019/10:52:05 PM    Final     Orson Eva, DO  [facility] Pager [phone]  If 7PM-7AM, please contact night-coverage [email or web address] Password [email or web address] 09/21/2019, 7:40 AM   LOS: 1 day

## 2019-09-21 NOTE — Consult Note (Signed)
WOC Nurse Consult Note: Patient receiving care in [place].  Consult completed remotely after review of record, including images of BLE. Reason for Consult: lymphedema Wound type: none noted in BLE photos Pressure Injury POA: Yes/No/NA Measurement: Wound bed: Drainage (amount, consistency, odor)  Periwound: Dressing procedure/placement/frequency:  Wash legs with soap and water. Pat dry. Apply [place] Moisturizing lotion (pink and white tube in clean utility) to BLE.  Beginning behind the toes and going to just below the knees, spiral wrap kerlex.  Then top with 4 inch Coban Kellie Simmering [phone]).  Perform daily. When patient is discharged from hospital he will need to go to an outpatient point of care, or receive HH, for compression wraps.  Our system does not have inpatient lymphedema management at this time. Thank you for the consult. [facility] nurse will not follow at this time.  Please re-consult the [facility] team if needed.  Val Riles, RN, MSN, CWOCN, CNS-BC, pager [phone]

## 2019-09-21 NOTE — Progress Notes (Signed)
ANTICOAGULATION CONSULT NOTE -  Pharmacy Consult for heparin Indication: chest pain/ACS/STEMI  No Known Allergies  Patient Measurements: Height: 4' 11"  (149.9 cm) Weight: 194 lb 14.2 oz (88.4 kg) IBW/kg (Calculated) : 47.7 Heparin Dosing Weight: 68 kg  Vital Signs: Temp: 98.9 F (37.2 C) (12/22 0400) Temp Source: Axillary (12/22 0400) BP: 126/99 (12/22 0615) Pulse Rate: 100 (12/22 0615)  Labs: Recent Labs    09/20/19 1040 09/20/19 1905 09/21/19 0007 09/21/19 0209 09/21/19 0416  HGB 13.8  --   --   --  12.3*  HCT 44.8  --   --   --  39.8  PLT 277  --   --   --  217  HEPARINUNFRC  --   --   --   --  0.32  CREATININE 0.72  --   --   --  0.88  TROPONINIHS 77* 693* 744* 663*  --     Estimated Creatinine Clearance: 68.7 mL/min (by C-G formula based on SCr of 0.88 mg/dL).   Medical History: Past Medical History:  Diagnosis Date  . Arthritis   . Congestive heart disease ([place]) 11/2015  . COPD (chronic obstructive pulmonary disease) ([place])   . DDD (degenerative disc disease), lumbosacral   . Depression   . Diabetic neuropathy ([place])   . Diastolic dysfunction   . Essential hypertension   . GERD (gastroesophageal reflux disease)   . Hyperlipidemia   . Iron deficiency anemia   . Left knee DJD   . Lumbar spinal stenosis   . Lymphedema 11/2015   BOTH LEGS  . NASH (nonalcoholic steatohepatitis)   . Peripheral edema   . Peripheral edema 10/05/2015  . Type 2 diabetes mellitus ([facility])     Medications:  Scheduled:  . aspirin EC  81 mg Oral Daily  . benazepril  40 mg Oral Daily  . carvedilol  6.25 mg Oral BID  . Chlorhexidine Gluconate Cloth  6 each Topical Q0600  . ferrous sulfate  325 mg Oral Daily  . furosemide  80 mg Intravenous Q12H  . gabapentin  400 mg Oral TID  . insulin aspart  0-15 Units Subcutaneous TID WC  . insulin aspart  0-5 Units Subcutaneous QHS  . insulin glargine  5 Units Subcutaneous Q2200  . levothyroxine  100 mcg Oral Q0600  . magnesium oxide  400  mg Oral BID  . metolazone  2.5 mg Oral Daily  . pantoprazole  40 mg Oral Daily  . potassium chloride  10 mEq Oral BID  . pravastatin  40 mg Oral QPM  . sodium chloride flush  3 mL Intravenous Q12H    Assessment: Patient is a 39 yom with a chief complaint of SOB thought to be from [facility] exacerbation. Troponins are elevated, most recently at 628 so pharmacy is consulted to dose heparin. Patient received 40 mg of Lovenox at 1530. Baseline H&H is wnl at 13.8/44.8, plts are also wnl at 277. No anticoagulation noted PTA.   Heparin level therapeutic at 0.32   Goal of Therapy:  Heparin level 0.3-0.7 units/ml Monitor platelets by anticoagulation protocol: Yes   Plan:  Continue heparin infusion at 800 units/hr pending cardiology recommendations. Check anti-Xa level in 8 hours and daily while on heparin Continue to monitor H&H and platelets   Margot Ables, PharmD Clinical Pharmacist 09/21/2019 10:52 AM

## 2019-09-22 ENCOUNTER — Encounter (HOSPITAL_COMMUNITY)
Admission: EM | Disposition: A | Discharge status: Home / Self Care | Payer: Self-pay | Source: Home / Self Care | Attending: Cardiovascular Disease

## 2019-09-22 ENCOUNTER — Other Ambulatory Visit: Payer: Self-pay

## 2019-09-22 DIAGNOSIS — I5021 Acute systolic (congestive) heart failure: Secondary | ICD-10-CM

## 2019-09-22 DIAGNOSIS — I5043 Acute on chronic combined systolic (congestive) and diastolic (congestive) heart failure: Secondary | ICD-10-CM

## 2019-09-22 HISTORY — PX: RIGHT/LEFT HEART CATH AND CORONARY ANGIOGRAPHY: CATH118266

## 2019-09-22 LAB — CBC
HCT: 40.7 % (ref 39.0–52.0)
HCT: 40 % (ref 39.0–52.0)
Hemoglobin: 12.6 g/dL — ABNORMAL LOW (ref 13.0–17.0)
Hemoglobin: 12.7 g/dL — ABNORMAL LOW (ref 13.0–17.0)
MCH: 26.7 pg (ref 26.0–34.0)
MCH: 26.9 pg (ref 26.0–34.0)
MCHC: 31 g/dL (ref 30.0–36.0)
MCHC: 31.8 g/dL (ref 30.0–36.0)
MCV: 86.2 fL (ref 80.0–100.0)
MCV: 84.7 fL (ref 80.0–100.0)
Platelets: 225 10*3/uL (ref 150–400)
Platelets: 241 10*3/uL (ref 150–400)
RBC: 4.72 MIL/uL (ref 4.22–5.81)
RBC: 4.72 MIL/uL (ref 4.22–5.81)
RDW: 15.4 % (ref 11.5–15.5)
RDW: 15.1 % (ref 11.5–15.5)
WBC: 5.3 10*3/uL (ref 4.0–10.5)
WBC: 5.9 10*3/uL (ref 4.0–10.5)
nRBC: 0 % (ref 0.0–0.2)
nRBC: 0 % (ref 0.0–0.2)

## 2019-09-22 LAB — POCT I-STAT EG7
Acid-Base Excess: 11 mmol/L — ABNORMAL HIGH (ref 0.0–2.0)
Bicarbonate: 37.5 mmol/L — ABNORMAL HIGH (ref 20.0–28.0)
Calcium, Ion: 1.12 mmol/L — ABNORMAL LOW (ref 1.15–1.40)
HCT: 39 % (ref 39.0–52.0)
Hemoglobin: 13.3 g/dL (ref 13.0–17.0)
O2 Saturation: 68 %
Potassium: 3.8 mmol/L (ref 3.5–5.1)
Sodium: 134 mmol/L — ABNORMAL LOW (ref 135–145)
TCO2: 39 mmol/L — ABNORMAL HIGH (ref 22–32)
pCO2, Ven: 55.7 mmHg (ref 44.0–60.0)
pH, Ven: 7.436 — ABNORMAL HIGH (ref 7.250–7.430)
pO2, Ven: 35 mmHg (ref 32.0–45.0)

## 2019-09-22 LAB — GLUCOSE, CAPILLARY
Glucose-Capillary: 167 mg/dL — ABNORMAL HIGH (ref 70–99)
Glucose-Capillary: 177 mg/dL — ABNORMAL HIGH (ref 70–99)
Glucose-Capillary: 173 mg/dL — ABNORMAL HIGH (ref 70–99)
Glucose-Capillary: 214 mg/dL — ABNORMAL HIGH (ref 70–99)

## 2019-09-22 LAB — POCT I-STAT 7, (LYTES, BLD GAS, ICA,H+H)
Acid-Base Excess: 13 mmol/L — ABNORMAL HIGH (ref 0.0–2.0)
Bicarbonate: 38.3 mmol/L — ABNORMAL HIGH (ref 20.0–28.0)
Calcium, Ion: 1.16 mmol/L (ref 1.15–1.40)
HCT: 39 % (ref 39.0–52.0)
Hemoglobin: 13.3 g/dL (ref 13.0–17.0)
O2 Saturation: 97 %
Potassium: 3.9 mmol/L (ref 3.5–5.1)
Sodium: 132 mmol/L — ABNORMAL LOW (ref 135–145)
TCO2: 40 mmol/L — ABNORMAL HIGH (ref 22–32)
pCO2 arterial: 52.3 mmHg — ABNORMAL HIGH (ref 32.0–48.0)
pH, Arterial: 7.473 — ABNORMAL HIGH (ref 7.350–7.450)
pO2, Arterial: 90 mmHg (ref 83.0–108.0)

## 2019-09-22 LAB — BASIC METABOLIC PANEL
Anion gap: 14 (ref 5–15)
BUN: 15 mg/dL (ref 8–23)
CO2: 34 mmol/L — ABNORMAL HIGH (ref 22–32)
Calcium: 9.3 mg/dL (ref 8.9–10.3)
Chloride: 88 mmol/L — ABNORMAL LOW (ref 98–111)
Creatinine, Ser: 1.02 mg/dL (ref 0.61–1.24)
GFR calc Af Amer: >60 mL/min (ref >60)
GFR calc non Af Amer: >60 mL/min (ref >60)
Glucose, Bld: 178 mg/dL — ABNORMAL HIGH (ref 70–99)
Potassium: 3.6 mmol/L (ref 3.5–5.1)
Sodium: 136 mmol/L (ref 135–145)

## 2019-09-22 LAB — CREATININE, SERUM
Creatinine, Ser: 1.08 mg/dL (ref 0.61–1.24)
GFR calc Af Amer: >60 mL/min (ref >60)
GFR calc non Af Amer: >60 mL/min (ref >60)

## 2019-09-22 LAB — HEPARIN LEVEL (UNFRACTIONATED)
Heparin Unfractionated: 0.17 IU/mL — ABNORMAL LOW (ref 0.30–0.70)
Heparin Unfractionated: 0.26 IU/mL — ABNORMAL LOW (ref 0.30–0.70)

## 2019-09-22 LAB — MAGNESIUM: Magnesium: 1.9 mg/dL (ref 1.7–2.4)

## 2019-09-22 SURGERY — RIGHT/LEFT HEART CATH AND CORONARY ANGIOGRAPHY
Anesthesia: LOCAL

## 2019-09-22 MED ORDER — SODIUM CHLORIDE 0.9 % IV SOLN: 250.0000 mL | INTRAVENOUS | Status: DC | PRN

## 2019-09-22 MED ORDER — VERAPAMIL HCL 2.5 MG/ML IV SOLN
INTRAVENOUS | Status: DC | PRN
  Administered 2019-09-22: 10 mL via INTRA_ARTERIAL

## 2019-09-22 MED ORDER — VERAPAMIL HCL 2.5 MG/ML IV SOLN
INTRAVENOUS | Status: AC
  Filled 2019-09-22: qty 2

## 2019-09-22 MED ORDER — HEPARIN (PORCINE) IN NACL 1000-0.9 UT/500ML-% IV SOLN
INTRAVENOUS | Status: DC | PRN
  Administered 2019-09-22 (×2): 500 mL

## 2019-09-22 MED ORDER — FUROSEMIDE 40 MG PO TABS
40.0000 mg | ORAL_TABLET | Freq: Every day | ORAL | Status: DC
  Administered 2019-09-23: 40 mg via ORAL
  Filled 2019-09-22: qty 1

## 2019-09-22 MED ORDER — HEPARIN (PORCINE) IN NACL 1000-0.9 UT/500ML-% IV SOLN
INTRAVENOUS | Status: AC
  Filled 2019-09-22: qty 1000

## 2019-09-22 MED ORDER — MIDAZOLAM HCL 2 MG/2ML IJ SOLN
INTRAMUSCULAR | Status: AC
  Filled 2019-09-22: qty 2

## 2019-09-22 MED ORDER — LIDOCAINE HCL (PF) 1 % IJ SOLN
INTRAMUSCULAR | Status: DC | PRN
  Administered 2019-09-22: 3 mL

## 2019-09-22 MED ORDER — FENTANYL CITRATE (PF) 100 MCG/2ML IJ SOLN
INTRAMUSCULAR | Status: DC | PRN
  Administered 2019-09-22: 25 ug via INTRAVENOUS

## 2019-09-22 MED ORDER — LIDOCAINE HCL (PF) 1 % IJ SOLN
INTRAMUSCULAR | Status: AC
  Filled 2019-09-22: qty 30

## 2019-09-22 MED ORDER — ENOXAPARIN SODIUM 40 MG/0.4ML ~~LOC~~ SOLN
40.0000 mg | SUBCUTANEOUS | Status: DC
  Administered 2019-09-23: 09:00:00 40 mg via SUBCUTANEOUS
  Filled 2019-09-22: qty 0.4

## 2019-09-22 MED ORDER — HEPARIN SODIUM (PORCINE) 1000 UNIT/ML IJ SOLN
INTRAMUSCULAR | Status: AC
  Filled 2019-09-22: qty 1

## 2019-09-22 MED ORDER — IOHEXOL 350 MG/ML SOLN
INTRAVENOUS | Status: DC | PRN
  Administered 2019-09-22: 30 mL via INTRA_ARTERIAL

## 2019-09-22 MED ORDER — HEPARIN SODIUM (PORCINE) 1000 UNIT/ML IJ SOLN
INTRAMUSCULAR | Status: DC | PRN
  Administered 2019-09-22: 4500 [IU] via INTRAVENOUS

## 2019-09-22 MED ORDER — HEPARIN BOLUS VIA INFUSION
2000.0000 [IU] | Freq: Once | INTRAVENOUS | Status: AC
  Administered 2019-09-22: 01:00:00 2000 [IU] via INTRAVENOUS
  Filled 2019-09-22: qty 2000

## 2019-09-22 MED ORDER — HEPARIN BOLUS VIA INFUSION
1000.0000 [IU] | Freq: Once | INTRAVENOUS | Status: AC
  Administered 2019-09-22: 12:00:00 1000 [IU] via INTRAVENOUS
  Filled 2019-09-22: qty 1000

## 2019-09-22 MED ORDER — SODIUM CHLORIDE 0.9 % IV SOLN: INTRAVENOUS | Status: DC

## 2019-09-22 MED ORDER — LOSARTAN POTASSIUM 25 MG PO TABS
25.0000 mg | ORAL_TABLET | Freq: Every day | ORAL | Status: DC
  Administered 2019-09-23: 09:00:00 25 mg via ORAL
  Filled 2019-09-22: qty 1

## 2019-09-22 MED ORDER — MIDAZOLAM HCL 2 MG/2ML IJ SOLN
INTRAMUSCULAR | Status: DC | PRN
  Administered 2019-09-22: 1 mg via INTRAVENOUS

## 2019-09-22 MED ORDER — SODIUM CHLORIDE 0.9% FLUSH: 3.0000 mL | Freq: Two times a day (BID) | INTRAVENOUS | Status: DC

## 2019-09-22 MED ORDER — FENTANYL CITRATE (PF) 100 MCG/2ML IJ SOLN
INTRAMUSCULAR | Status: AC
  Filled 2019-09-22: qty 2

## 2019-09-22 MED ORDER — SODIUM CHLORIDE 0.9% FLUSH: 3.0000 mL | INTRAVENOUS | Status: DC | PRN

## 2019-09-22 SURGICAL SUPPLY — 12 items

## 2019-09-22 NOTE — Consult Note (Addendum)
Cardiology Consult    Patient ID: Michael [name]; [id number]; 31-Dec-1945   Admit date: 09/20/2019 Date of Consult: 09/22/2019  Primary Care Provider: [name], MD Primary Cardiologist: [name], MD   Patient Profile    DESSIE DELCARLO is a 73 y.o. male with past medical history of chronic diastolic CHF, HTN, HLD, Type 2 DM and COPD who is being seen today for the evaluation of CHF and newly reduced EF at the request of Dr. Carles Collet.   History of Present Illness    Mr. Mazur most recently had a telehealth visit with Dr. Harl Bowie on 09/02/2019 and had been experiencing worsening edema over the past few months. Weight had peaked at 206 lbs but was trending down with further titration of diuretic therapy. Weight was 196 lbs at the time of his visit and he was on Bumex 22m BID along with Metolazone 2.599mthree times weekly. He had a lymphedema machine at home and was using this intermittently. He was continued on his current regimen at that time.   [facility] for evaluation of worsening dyspnea starting earlier that day, initially requiring BiPAP while in the ED. He developed acute dyspnea while showering that morning and was unable to catch his breath, therefore he asked his caregiver to call EMS. He denies any associated chest pain or palpitations. No recent orthopnea or PND. He has chronic lymphedema and is mostly wheelchair bound but does stand to pivot for transfers. Says he was not taking his Bumex as prescribed due to frequent urination.   Initial labs showed WBC 9.0, Hgb 13.8, platelets 277, Na+ 134, K+ 4.0, and creatinine 0.72. BNP 233. Initial HS Troponin 77 with repeat values of 270, 628, 693, 744 and 663. COVID negative. CXR showing pulmonary edema and possible atelectasis versus developing infection. EKG shows sinus tachycardia, HR 108 with new diffuse TWI along the anterolateral leads. An echocardiogram was obtained and shows a reduced EF  of 15-20% with severe hypokinesis and akinesis if the distal 1/3 of left ventricle. Normal RV function with trivial MR and mild TR.   He has been receiving IV Lasix 8062mID along with Metolazone 2.5mg73mily. He has a recorded net output of -9.2 L thus far (-3.8L within the past 24 hours). Creatinine is starting to trend upwards from 0.72 --> 0.88 --> 1.02. Weight has declined from 203 lbs on admission to 188 lbs today. He has been continued on PTA Benazepril 40mg7mly and Coreg 6.25mg 7m   He reports significant improvement in his dyspnea since admission. Is anxious to go home for the holidays.    Past Medical History:  Diagnosis Date  . Arthritis   . Congestive heart disease (HCC) [id number]  . COPD (chronic obstructive pulmonary disease) (HCC)  [place]D (degenerative disc disease), lumbosacral   . Depression   . Diabetic neuropathy (HCC)  [place]ancleaveDiastolic dysfunction   . Essential hypertension   . GERD (gastroesophageal reflux disease)   . Hyperlipidemia   . Iron deficiency anemia   . Left knee DJD   . Lumbar spinal stenosis   . Lymphedema 11/2015   BOTH LEGS  . NASH (nonalcoholic steatohepatitis)   . Peripheral edema   . Peripheral edema 10/05/2015  . Type 2 diabetes mellitus (HCC)  DeloitPast Surgical History:  Procedure Laterality Date  . BIOPSY N/A 07/21/2014   Procedure: BIOPSY;  Surgeon: RobertDaneil Dolin Location: [facility];  Service: Endoscopy;  Laterality: N/A;  . CATARACT EXTRACTION W/PHACO Right 07/08/2016   Procedure: CATARACT EXTRACTION PHACO AND INTRAOCULAR LENS PLACEMENT RIGHT EYE;  Surgeon: Tonny Branch, MD;  Location: [facility];  Service: Ophthalmology;  Laterality: Right;  CDE: 9.01  . CATARACT EXTRACTION W/PHACO Left 07/22/2016   Procedure: CATARACT EXTRACTION PHACO AND INTRAOCULAR LENS PLACEMENT LEFT EYE CDE=10.69;  Surgeon: Tonny Branch, MD;  Location: [facility];  Service: Ophthalmology;  Laterality: Left;  left -   . COLONOSCOPY  2011   [facility]: normal colon, normal distal  ileum  . COLONOSCOPY WITH PROPOFOL N/A 07/21/2014   Procedure: ATTEMPTED COLONOSCOPY WITH PROPOFOL-HAD TO STOP DUE TO BRADYCARDIA;  Surgeon: Daneil Dolin, MD;  Location: [facility];  Service: Endoscopy;  Laterality: N/A;  . EGD with enteroscopy  2011   [facility]: normal esophagus and stomach. Normal duodenum, jejunum. No evidence of AVMs.   . ESOPHAGOGASTRODUODENOSCOPY (EGD) WITH PROPOFOL N/A 07/21/2014   Procedure: ESOPHAGOGASTRODUODENOSCOPY (EGD) WITH PROPOFOL;  Surgeon: Daneil Dolin, MD;  Location: [facility];  Service: Endoscopy;  Laterality: N/A;  . HERNIA REPAIR    . KNEE ARTHROSCOPY WITH MEDIAL MENISECTOMY Left 11/06/2012   Procedure: KNEE ARTHROSCOPY WITH MEDIAL MENISECTOMY;  Surgeon: Carole Civil, MD;  Location: [facility];  Service: Orthopedics;  Laterality: Left;  . Lipoma removal     Stomach  . LUMBAR LAMINECTOMY/DECOMPRESSION MICRODISCECTOMY Left 08/30/2013   Procedure: LUMBAR LAMINECTOMY/DECOMPRESSION MICRODISCECTOMY LEFT  LUMBAR TWO THREE;  Surgeon: Otilio Connors, MD;  Location: [place] [facility];  Service: Neurosurgery;  Laterality: Left;  . SHOULDER SURGERY     Rght-rotator cuff  . TOOTH EXTRACTION       Home Medications:  Prior to Admission medications   Medication Sig Start Date End Date Taking? Authorizing Provider  aspirin EC 81 MG tablet Take 81 mg by mouth daily.   Yes [provider]  B-D ULTRAFINE III SHORT PEN 31G X 8 MM MISC USE AS DIRECTED DAILY WITH LANTUS. Patient taking differently: See admin instructions. USE AS DIRECTED DAILY WITH LANTUS 05/17/16  Yes [place], Modena Nunnery, MD  benazepril (LOTENSIN) 40 MG tablet TAKE ONE TABLET BY MOUTH DAILY. 05/27/16  Yes [place], Modena Nunnery, MD  Blood Glucose Monitoring Suppl (BLOOD GLUCOSE SYSTEM PAK) KIT Please dispense based on patient and insurance preference. Use as directed to monitor FSBS 2x daily. Dx: E11.9 08/20/18  Yes [place], Modena Nunnery, MD  bumetanide (BUMEX) 2 MG tablet TAKE 1&1/2 TABLETS BY MOUTH TWICE  DAILY. Patient taking differently: Take 2 mg by mouth 2 (two) times daily.  06/17/16  Yes Branch, Alphonse Guild, MD  carvedilol (COREG) 6.25 MG tablet Take 1 tablet (6.25 mg total) by mouth 2 (two) times daily. 09/02/19  Yes BranchAlphonse Guild, MD  diclofenac sodium (VOLTAREN) 1 % GEL Apply 4 g topically 4 (four) times daily as needed (pain). 03/06/17  Yes [place], Modena Nunnery, MD  Ferrous Sulfate (IRON) 325 (65 FE) MG TABS Take 1 tablet by mouth daily.    Yes [provider]  gabapentin (NEURONTIN) 400 MG capsule Take 1 capsule (400 mg total) by mouth 3 (three) times daily. 03/04/18  Yes [place], Modena Nunnery, MD  Glucose Blood (BLOOD GLUCOSE TEST STRIPS) STRP Please dispense based on patient and insurance preference. Use as directed to monitor FSBS 2x daily. Dx: E11.9 08/20/18  Yes , Modena Nunnery, MD  Insulin Glargine (LANTUS SOLOSTAR) 100 UNIT/ML Solostar Pen Inject 5 Units into the skin daily at 10 pm. 03/06/18  Yes  [place], Modena Nunnery, MD  Lancets MISC Please dispense based on patient and insurance preference. Use as directed to monitor FSBS 2x daily. Dx: E11.9 08/20/18  Yes [place], Modena Nunnery, MD  levothyroxine (SYNTHROID, LEVOTHROID) 100 MCG tablet TAKE 1 TABLET BY MOUTH DAILY BEFORE BREAKFAST. Patient taking differently: Take 100 mcg by mouth daily before breakfast.  07/06/18  Yes [place], Cammie Mcgee, MD  magnesium oxide (MAG-OX) 400 MG tablet Take 1 tablet (400 mg total) by mouth 2 (two) times daily. 11/19/17  Yes Imogene Burn, PA-C  metFORMIN (GLUCOPHAGE) 1000 MG tablet TAKE ONE TABLET BY MOUTH TWICE DAILY WITH A MEAL. 11/07/15  Yes [name], MD  metolazone (ZAROXOLYN) 2.5 MG tablet TAKE EVERY Tuesday, Thursday & Saturday. 12/09/18  Yes Branch, Alphonse Guild, MD  nystatin cream (MYCOSTATIN) APPLY TO AFFECTED AREA TWICE DAILY. 03/04/18  Yes [place], Modena Nunnery, MD  oxyCODONE-acetaminophen (PERCOCET) 7.5-325 MG tablet TAKE 1 TABLET BY MOUTH EVERY 6 HOURS AS NEEDED FOR SEVERE PAIN. Patient taking  differently: Take 1 tablet by mouth every 6 (six) hours as needed for severe pain.  06/30/18  Yes Susy Frizzle, MD  pantoprazole (PROTONIX) 40 MG tablet Take 1 tablet (40 mg total) by mouth daily. 11/04/17  Yes Branch, Alphonse Guild, MD  potassium chloride (K-DUR) 10 MEQ tablet Take 1 tablet (10 mEq total) by mouth 2 (two) times daily. 11/19/17  Yes Imogene Burn, PA-C  pravastatin (PRAVACHOL) 40 MG tablet TAKE ONE TABLET BY MOUTH DAILY. Patient taking differently: TAKE ONE TABLET BY MOUTH DAILY IN THE EVENING 05/21/16  Yes [place], Modena Nunnery, MD  traZODone (DESYREL) 100 MG tablet TAKE 1 TABLET BY MOUTH AT BEDTIME AS NEEDED FOR SLEEP. 11/18/18  Yes [place], Modena Nunnery, MD  triamcinolone cream (KENALOG) 0.1 % Apply 1 application topically 2 (two) times daily. To arms/hand 11/18/18  Yes [place], Modena Nunnery, MD    Inpatient Medications: Scheduled Meds: . aspirin EC  81 mg Oral Daily  . carvedilol  6.25 mg Oral BID  . Chlorhexidine Gluconate Cloth  6 each Topical Q0600  . ferrous sulfate  325 mg Oral Daily  . furosemide  80 mg Intravenous Q12H  . gabapentin  400 mg Oral TID  . heparin  1,000 Units Intravenous Once  . insulin aspart  0-15 Units Subcutaneous TID WC  . insulin aspart  0-5 Units Subcutaneous QHS  . insulin glargine  5 Units Subcutaneous Q2200  . levothyroxine  100 mcg Oral Q0600  . [START ON 09/23/2019] losartan  25 mg Oral Daily  . magnesium oxide  400 mg Oral BID  . mouth rinse  15 mL Mouth Rinse BID  . pantoprazole  40 mg Oral Daily  . potassium chloride  10 mEq Oral BID  . pravastatin  40 mg Oral QPM  . sodium chloride flush  3 mL Intravenous Q12H   Continuous Infusions: . sodium chloride    . heparin 1,250 Units/hr (09/22/19 0849)   PRN Meds: sodium chloride, acetaminophen **OR** acetaminophen, diclofenac sodium, labetalol, levalbuterol, ondansetron **OR** ondansetron (ZOFRAN) IV, oxyCODONE-acetaminophen, sodium chloride flush, traZODone  Allergies:   No Known  Allergies  Social History:   Social History   Socioeconomic History  . Marital status: Widowed    Spouse name: Not on file  . Number of children: Not on file  . Years of education: 75  . Highest education level: Not on file  Occupational History  . Not on file  Tobacco Use  . Smoking status: Former Smoker  Packs/day: 2.50    Years: 44.00    Pack years: 110.00    Types: Cigarettes    Start date: 05/07/1955    Quit date: 10/01/1999    Years since quitting: 19.9  . Smokeless tobacco: Never Used  Substance and Sexual Activity  . Alcohol use: No    Alcohol/week: 0.0 standard drinks  . Drug use: No  . Sexual activity: Yes    Birth control/protection: None  Other Topics Concern  . Not on file  Social History Narrative  . Not on file   Social Determinants of Health   Financial Resource Strain:   . Difficulty of Paying Living Expenses: Not on file  Food Insecurity:   . Worried About Charity fundraiser in the Last Year: Not on file  . Ran Out of Food in the Last Year: Not on file  Transportation Needs:   . Lack of Transportation (Medical): Not on file  . Lack of Transportation (Non-Medical): Not on file  Physical Activity:   . Days of Exercise per Week: Not on file  . Minutes of Exercise per Session: Not on file  Stress:   . Feeling of Stress : Not on file  Social Connections:   . Frequency of Communication with Friends and Family: Not on file  . Frequency of Social Gatherings with Friends and Family: Not on file  . Attends Religious Services: Not on file  . Active Member of Clubs or Organizations: Not on file  . Attends Archivist Meetings: Not on file  . Marital Status: Not on file  Intimate Partner Violence:   . Fear of Current or Ex-Partner: Not on file  . Emotionally Abused: Not on file  . Physically Abused: Not on file  . Sexually Abused: Not on file     Family History:    Family History  Problem Relation Age of Onset  . Heart disease Mother    . Hyperlipidemia Mother   . Hypertension Mother   . Depression Mother   . Diabetes Mother   . Rectal cancer Mother   . Cancer Mother   . Heart disease Father   . Hypertension Father   . Hyperlipidemia Father   . Diabetes Father   . Cancer Father   . Heart disease Sister   . Hyperlipidemia Sister   . Hypertension Sister   . Diabetes Sister   . Diabetes Brother   . Heart disease Sister   . Hyperlipidemia Sister   . Hypertension Sister   . Heart disease Sister   . Hyperlipidemia Sister   . Hypertension Sister   . Diabetes Sister   . Diabetes Brother       Review of Systems    General:  No chills, fever, night sweats or weight changes.  Cardiovascular:  No chest pain, orthopnea, palpitations, paroxysmal nocturnal dyspnea. Positive for dyspnea on exertion and edema.  Dermatological: No rash, lesions/masses Respiratory: No cough, dyspnea Urologic: No hematuria, dysuria Abdominal:   No nausea, vomiting, diarrhea, bright red blood per rectum, melena, or hematemesis Neurologic:  No visual changes, wkns, changes in mental status. All other systems reviewed and are otherwise negative except as noted above.  Physical Exam/Data    Vitals:   09/22/19 0700 09/22/19 0800 09/22/19 0809 09/22/19 0830  BP: 120/70 119/74    Pulse: 87 86 95 (!) 101  Resp: 15 13 19 20   Temp:   98.3 F (36.8 C)   TempSrc:   Oral   SpO2: 95% 96%  96% 93%  Weight:      Height:        Intake/Output Summary (Last 24 hours) at 09/22/2019 1048 Last data filed at 09/22/2019 0849 Gross per 24 hour  Intake 895.84 ml  Output 4175 ml  Net -3279.16 ml   Filed Weights   09/20/19 1723 09/21/19 0500 09/22/19 0500  Weight: 92.1 kg 88.4 kg 85.6 kg   Body mass index is 38.12 kg/m.   General: Pleasant Caucasian male appearing in NAD Psych: Normal affect. Neuro: Alert and oriented X 3. Moves all extremities spontaneously. HEENT: Normal  Neck: Supple without bruits. JVD at 9 cm. Lungs:  Resp regular and  unlabored, decreased along bases bilaterally. Heart: RRR no s3, s4, or murmurs. Abdomen: Soft, non-tender, non-distended, BS + x 4.  Extremities: No clubbing or cyanosis. Chronic lymphedema with dressings in place. DP/PT/Radials 2+ and equal bilaterally.   EKG:  The EKG was personally reviewed and demonstrates: Sinus tachycardia, HR 108 with new diffuse TWI along the anterolateral leads.  Telemetry:  Telemetry was personally reviewed and demonstrates: NSR, HR in 80's to 110's with brief episodes of narrow-complex tachycardia, most consistent with atrial tachycardia. No definitive atrial fibrillation or flutter.    Labs/Studies     Relevant CV Studies:  Echocardiogram: 10/2015 Study Conclusions  - Left ventricle: The cavity size was mildly dilated. Wall   thickness was increased in a pattern of mild LVH. Systolic   function was normal. The estimated ejection fraction was in the   range of 50% to 55%. Wall motion was normal; there were no   regional wall motion abnormalities. Doppler parameters are   consistent with abnormal left ventricular relaxation (grade 1   diastolic dysfunction). - Mitral valve: Calcified annulus. Mildly thickened leaflets . - Atrial septum: No defect or patent foramen ovale was identified.   Echocardiogram: 09/20/2019 IMPRESSIONS    1. Left ventricular ejection fraction, by visual estimation, is <20%. The left ventricle has severely decreased function. There is no left ventricular hypertrophy.  2. The left ventricle demonstrates regional wall motion abnormalities.  3. LVEF is approximately 15 to 20% with severe hypokinesis and akinesis if the distal 1/3 of left ventricle.. Only extreme proximal portions of LV thicken during systole. Compared to echo from 2017, LVEF is now severely depressed.  4. Global right ventricle has normal systolic function.The right ventricular size is normal. No increase in right ventricular wall thickness.  5. Left atrial size  was normal.  6. Right atrial size was normal.  7. Mild mitral annular calcification.  8. The mitral valve is abnormal. Trivial mitral valve regurgitation.  9. The tricuspid valve is normal in structure. Tricuspid valve regurgitation is mild. 10. The aortic valve is abnormal. Aortic valve regurgitation is not visualized. Mild aortic valve sclerosis without stenosis. 11. The pulmonic valve was normal in structure. Pulmonic valve regurgitation is not visualized. 12. The inferior vena cava is dilated in size with <50% respiratory variability, suggesting right atrial pressure of 15 mmHg.  Laboratory Data:  Chemistry Recent Labs  Lab 09/20/19 1040 09/21/19 0416 09/22/19 0458  NA 134* 137 136  K 4.0 4.2 3.6  CL 97* 92* 88*  CO2 25 33* 34*  GLUCOSE 227* 128* 178*  BUN 11 12 15   CREATININE 0.72 0.88 1.02  CALCIUM 8.8* 9.2 9.3  GFRNONAA >60 >60 >60  GFRAA >60 >60 >60  ANIONGAP 12 12 14     Recent Labs  Lab 09/20/19 1040  PROT 6.9  ALBUMIN 3.9  AST  23  ALT 21  ALKPHOS 72  BILITOT 0.6   Hematology Recent Labs  Lab 09/20/19 1040 09/21/19 0416 09/22/19 0458  WBC 9.0 6.9 5.3  RBC 5.10 4.62 4.72  HGB 13.8 12.3* 12.6*  HCT 44.8 39.8 40.7  MCV 87.8 86.1 86.2  MCH 27.1 26.6 26.7  MCHC 30.8 30.9 31.0  RDW 15.6* 15.4 15.4  PLT 277 217 225   Cardiac EnzymesNo results for input(s): TROPONINI in the last 168 hours. No results for input(s): TROPIPOC in the last 168 hours.  BNP Recent Labs  Lab 09/20/19 1040  BNP 233.0*    DDimer No results for input(s): DDIMER in the last 168 hours.  Radiology/Studies:  DG Chest Portable 1 View  Result Date: 09/20/2019 CLINICAL DATA:  Shortness of breath EXAM: PORTABLE CHEST 1 VIEW COMPARISON:  10/05/2015 FINDINGS: Leads overlie the chest particularly the left chest. Cardiomediastinal contours remain enlarged. Increased retrocardiac density is noted. Increased interstitial markings are noted bilaterally diffusely throughout the chest.  Visualized skeletal structures are unremarkable. Upper abdominal bowel gas projects over the left hemidiaphragm on this AP lordotic view. IMPRESSION: 1. Diffuse interstitial prominence likely edema. 2. Increase density in the retrocardiac region may represent atelectasis or developing infection. 3. Bowel gas pattern unusual with some bowel loops appearing to project above the left hemidiaphragm, this is of uncertain significance though could be related to lordotic positioning, consider further evaluation with abdominal radiographs. Electronically Signed   By: Zetta Bills M.D.   On: 09/20/2019 10:55   ECHOCARDIOGRAM COMPLETE  Result Date: 09/20/2019   ECHOCARDIOGRAM REPORT   Patient Name:   Michael [name] Date of Exam: 09/20/2019 Medical Rec #:  [id number]       Height:       59.0 in Accession #:    [id number]      Weight:       196.2 lb Date of Birth:  06-26-1946        BSA:          1.83 m Patient Age:    79 years        BP:           158/136 mmHg Patient Gender: M               HR:           117 bpm. Exam Location:  [facility] Procedure: 2D Echo, Cardiac Doppler and Color Doppler Indications:    CHF-Acute Diastolic 902.40 / X73.53  History:        Patient has prior history of Echocardiogram examinations, most                 recent 10/05/2015. CHF; Risk Factors:Hypertension, Dyslipidemia                 and Diabetes. Patient on BiPAP w/ face mask.  Sonographer:    Alvino Chapel RCS Referring Phys: [id number] [place] D [place]  1. Left ventricular ejection fraction, by visual estimation, is <20%. The left ventricle has severely decreased function. There is no left ventricular hypertrophy.  2. The left ventricle demonstrates regional wall motion abnormalities.  3. LVEF is approximately 15 to 20% with severe hypokinesis and akinesis if the distal 1/3 of left ventricle.. Only extreme proximal portions of LV thicken during systole. Compared to echo from 2017, LVEF is now severely depressed.  4. Global right  ventricle has normal systolic function.The right ventricular size is normal. No increase in right ventricular wall thickness.  5. Left atrial size was normal.  6. Right atrial size was normal.  7. Mild mitral annular calcification.  8. The mitral valve is abnormal. Trivial mitral valve regurgitation.  9. The tricuspid valve is normal in structure. Tricuspid valve regurgitation is mild. 10. The aortic valve is abnormal. Aortic valve regurgitation is not visualized. Mild aortic valve sclerosis without stenosis. 11. The pulmonic valve was normal in structure. Pulmonic valve regurgitation is not visualized. 12. The inferior vena cava is dilated in size with <50% respiratory variability, suggesting right atrial pressure of 15 mmHg. FINDINGS  Left Ventricle: Left ventricular ejection fraction, by visual estimation, is <20%. The left ventricle has severely decreased function. The left ventricle demonstrates regional wall motion abnormalities. There is no left ventricular hypertrophy. LVEF is approximately 15 to 20% with severe hypokinesis and akinesis if the distal 1/3 of left ventricle.. Only extreme proximal portions of LV thicken during systole. Compared to echo from 2017, LVEF is now severely depressed. Right Ventricle: The right ventricular size is normal. No increase in right ventricular wall thickness. Global RV systolic function is has normal systolic function. Left Atrium: Left atrial size was normal in size. Right Atrium: Right atrial size was normal in size Pericardium: There is no evidence of pericardial effusion. Mitral Valve: The mitral valve is abnormal. There is mild thickening of the mitral valve leaflet(s). Mild mitral annular calcification. Trivial mitral valve regurgitation. MV peak gradient, 6.2 mmHg. Tricuspid Valve: The tricuspid valve is normal in structure. Tricuspid valve regurgitation is mild. Aortic Valve: The aortic valve is abnormal. Aortic valve regurgitation is not visualized. Mild aortic  valve sclerosis is present, with no evidence of aortic valve stenosis. Pulmonic Valve: The pulmonic valve was normal in structure. Pulmonic valve regurgitation is not visualized. Pulmonic regurgitation is not visualized. Aorta: The aortic root is normal in size and structure. Venous: The inferior vena cava is dilated in size with less than 50% respiratory variability, suggesting right atrial pressure of 15 mmHg. IAS/Shunts: No atrial level shunt detected by color flow Doppler.  LEFT VENTRICLE PLAX 2D LVIDd:         4.95 cm LVIDs:         4.45 cm LV PW:         1.08 cm LV IVS:        1.02 cm LVOT diam:     1.90 cm LV SV:         25 ml LV SV Index:   12.88 LVOT Area:     2.84 cm  LV Volumes (MOD) LV area d, A2C:    30.50 cm LV area d, A4C:    32.90 cm LV area s, A2C:    24.20 cm LV area s, A4C:    27.30 cm LV major d, A2C:   8.38 cm LV major d, A4C:   8.29 cm LV major s, A2C:   7.55 cm LV major s, A4C:   7.93 cm LV vol d, MOD A2C: 92.9 ml LV vol d, MOD A4C: 107.0 ml LV vol s, MOD A2C: 69.1 ml LV vol s, MOD A4C: 80.7 ml LV SV MOD A2C:     23.8 ml LV SV MOD A4C:     107.0 ml LV SV MOD BP:      23.9 ml RIGHT VENTRICLE RV S prime:     10.90 cm/s TAPSE (M-mode): 1.8 cm LEFT ATRIUM             Index  RIGHT ATRIUM           Index LA diam:        3.70 cm 2.02 cm/m  RA Area:     14.20 cm LA Vol (A2C):   85.1 ml 46.52 ml/m RA Volume:   34.70 ml  18.97 ml/m LA Vol (A4C):   55.8 ml 30.50 ml/m LA Biplane Vol: 71.6 ml 39.14 ml/m  AORTIC VALVE LVOT Vmax:   67.40 cm/s LVOT Vmean:  46.200 cm/s LVOT VTI:    0.108 m  AORTA Ao Root diam: 3.30 cm MITRAL VALVE MV Area (PHT): 3.91 cm             SHUNTS MV Peak grad:  6.2 mmHg             Systemic VTI:  0.11 m MV Mean grad:  2.0 mmHg             Systemic Diam: 1.90 cm MV Vmax:       1.24 m/s MV Vmean:      57.3 cm/s MV VTI:        0.22 m MV PHT:        56.26 msec MV Decel Time: 194 msec MV E velocity: 109.00 cm/s 103 cm/s  Dorris Carnes MD Electronically signed by Dorris Carnes MD  Signature Date/Time: 09/20/2019/10:52:05 PM    Final      Assessment & Plan    1. Acute on Chronic Combined Systolic and Diastolic CHF/ New Cardiomyopathy - presented with worsening dyspnea and BNP found to be elevated to 233 on admission and CXR consistent with CHF. Echo shows a reduced EF of 15-20% with severe hypokinesis and akinesis if the distal 1/3 of left ventricle which is new compared to prior imaging in 2017. - he has responded well to IV Lasix 8m BID along with Metolazone 2.527mdaily. He has a recorded net output of -9.2 L thus far (-3.8L within the past 24 hours). Creatinine is starting to trend upwards from 0.72 --> 0.88 --> 1.02. Weight has declined from 203 lbs on admission to 188 lbs today (reported baseline of 185 lbs). Will hold Metolazone given rise in creatinine.  - given his cardiomyopathy, [facility]. The patient understands that risks include but are not limited to stroke (1 in 1000), death (1 in 1046 kidney failure [usually temporary] (1 in 500), bleeding (1 in 200), allergic reaction [possibly serious] (1 in 200). He was initially very hesitant to remain in the hospital as he wants to be home for the holidays and preferred an outpatient procedure but upon reviewing the indications for the procedure in regards to his cardiomyopathy and elevated troponin, he is open to proceeding this admission but would like to discuss with his niece who is coming to visit around noon today. Tentatively added to the cath board tomorrow morning at 0900. [facility].  - remains on Coreg and Benazepril. Will review with Dr. RoHarrington Challengern regards to continuing BB if concern for low-output HF. Will transition Benazepril to Losartan to allow wash out for possible initiation of Entresto prior to discharge.   2. NSTEMI - HS Troponin values have been elevated at 77, 270, 628, 693, 744 and 663 this admission. EKG shows diffuse TWI  along the anterolateral leads. He denies any recent chest pain but had experienced acute worsening dyspnea prior to admission.  - would recommend R/[facility] outlined above.  - continue Heparin  along with ASA, BB, and statin therapy.   3. HTN - BP has overall been well-controlled within the past 24 hours, at 119/74 on most recent check. Currentlt on Coreg 6.41m BID and Benazepril 427mdaily. Will transition Benazepril to Losartan as [facility]   4. HLD - followed by PCP. FLP in 08/2019 showed total cholesterol of 133, HDL 41 and LDL 69. Remains on Pravastatin 4011maily.   5. Type 2 DM - Hgb A1c at 6.8 in 08/2019.   For questions or updates, please contact [facility] consult [email or web address] for contact info under Cardiology/STEMI.  Signed, BriErma HeritageA-C 09/22/2019, 10:48 AM Pager: [phone] seen and examined   I agree with findings as noted by B Strader above Pt is a 73 19 with hx diastolic CHF, HTN, HL, DM, lymphedema and COPD   Has noted progressive SOB and edema   Seen as televisit early in Dec by J Branch   Diuretics increased.  Presented to ED on Mon with severe Dyspnea.  Echo Monday showed LVEF now severely depressed (<20%).   Pt denies CP   SInce admit he has diuresed significantly ON exam:  JVP is approx 9   Lungs are relatively clear   Cardac exam:  RRR  +S3  PMI diffuse Abdomen is supple  No hepatomegaly  Ext :  Legs wrapped  Still 1+edema   Chronic skin changes EKG with no Q waves    T wave inversion anterolateally      Discussed with pt and wife  Need to eval for CAD    Would recomm R and L heart cath to define anatomy and pressures    Risks explained  Pt understands an agrees to proceed Plan for today   Tx to [employer]nazepril with plans to transition to EntEnbridge EnergyauDorris Carnes

## 2019-09-22 NOTE — Interval H&P Note (Signed)
History and Physical Interval Note:  09/22/2019 1:37 PM  Michael [name]  has presented today for surgery, with the diagnosis of cardiomyopathy.  The various methods of treatment have been discussed with the patient and family. After consideration of risks, benefits and other options for treatment, the patient has consented to  Procedure(s): RIGHT/LEFT HEART CATH AND CORONARY ANGIOGRAPHY (N/A) as a surgical intervention.  The patient's history has been reviewed, patient examined, no change in status, stable for surgery.  I have reviewed the patient's chart and labs.  Questions were answered to the patient's satisfaction.   Cath Lab Visit (complete for each Cath Lab visit)  Clinical Evaluation Leading to the Procedure:   ACS: Yes.    Non-ACS:    Anginal Classification: CCS III  Anti-ischemic medical therapy: Maximal Therapy (2 or more classes of medications)  Non-Invasive Test Results: No non-invasive testing performed  Prior CABG: No previous CABG        Michael [name] 09/22/2019 1:37 PM

## 2019-09-22 NOTE — Progress Notes (Signed)
Patient resting comfortably on room air at this time, no distress noted. RT will continue to monitor pt.

## 2019-09-22 NOTE — Progress Notes (Signed)
PROGRESS NOTE  Michael [name] OEH:[id number] DOB: 06/10/1946 DOA: 09/20/2019 PCP: [name], MD  Brief History:  73 year old male with a history of diastolic CHF, depression, diabetes mellitus type 2, NASH, hypertension, morbid obesity, hypothyroidism presenting with 2-day history of shortness of breath and lower extremity edema.  He is also been complaining of orthopnea type symptoms.  Upon EMS arrival, the patient was noted to have oxygen saturation of 88% on room air.  He was placed on BiPAP in the emergency department with improvement of his respiratory status.  The patient had denied any fevers, chills, chest pain, coughing, hemoptysis, nausea, vomiting, direct abdominal pain.  Unfortunately, the patient states that he has not been completely compliant with his diuretic regimen.  In addition, the patient appears to have poor insight regarding his antihypertensive and cardiac medications.  Furthermore, he has had some indiscretion regarding his diet and fluid intake. In the emergency department, the patient was afebrile hemodynamically stable.  He was initially tachycardic in the 150s.  He has subsequently been weaned off of BiPAP.  He was initially started on intravenous furosemide.  Initial troponin was 270 and peaked at 744.  The patient was empirically started on IV heparin.  BNP was 233.  EKG shows sinus rhythm with T wave inversion V3-V6.  Assessment/Plan: Acute respiratory failure with hypoxia -Secondary to pulmonary edema/CHF -Initially placed on BiPAP -Currently on nasal cannula at 2 L -Continue to wean oxygen for saturation greater 92% -personally reviewed CXR--increased interstitial markings  Acute systolic CHF -03/70/4888 echo EF <20%, severe HK, AK in the distal one third LV, mild TR, trivial MR -Continue IV furosemide -Daily weights -Accurate I's and O's -According to 09/02/2019 office cardiology note by Dr. Harl Bowie, the patient has gained 11 pounds in the  past month. -Patient endorses poor compliance with diuretics as well as indiscretion with fluid intake -cardiology consult -continue carvedilol and benazepril -volume status is improving with IV diuretics. -seen by cardiology with plans to transfer to [facility] for cardiac cath  Essential hypertension -Continue carvedilol, benazepril  Elevated troponin -Secondary to decompensated CHF -No chest pain presently -personally reviewed EKG--sinus, nonspecific T wave change  Diabetes mellitus type 2 -08/03/2019 hemoglobin A1c 6.8 -Continue reduced dose -NovoLog sliding scale  Hypothyroidism -Continue Synthroid  Hyperlipidemia -Continue statin  Diabetic polyneuropathy -Continue gabapentin  Venous Stasis dermatitis -may benefit from UNNA boots  Hypomagnesemia -replete     Disposition Plan:   [facility] Family Communication:  No Family at bedside  Consultants: cardiology   Code Status:  FULL   DVT Prophylaxis:  IV heparin>>>lovenox   Procedures: As Listed in Progress Note Above  Antibiotics: None       Subjective: Patient is feeling better today. Shortness of breath improving. No chest pain   Objective: Vitals:   09/22/19 1436 09/22/19 1500 09/22/19 1600 09/22/19 1645  BP:   (!) 129/112 (!) 106/91  Pulse: (!) 0  94 99  Resp: (!) 0  18 20  Temp:  98.5 F (36.9 C)  98.4 F (36.9 C)  TempSrc:  Oral  Oral  SpO2: (!) 0%  96% 92%  Weight:      Height:        Intake/Output Summary (Last 24 hours) at 09/22/2019 1822 Last data filed at 09/22/2019 1700 Gross per 24 hour  Intake 437.69 ml  Output 3750 ml  Net -3312.31 ml   Weight change: -3.4 kg Exam:  General exam: Alert, awake, oriented x 3 Respiratory system: Clear to auscultation. Respiratory effort normal. Cardiovascular system:RRR. No murmurs, rubs, gallops. Gastrointestinal system: Abdomen is nondistended, soft and nontender. No organomegaly or masses felt. Normal bowel sounds  heard. Central nervous system: Alert and oriented. No focal neurological deficits. Extremities: compression dressings applied to legs bilaterally, 1-2+ edema bilatearlly Skin: No rashes, lesions or ulcers Psychiatry: Judgement and insight appear normal. Mood & affect appropriate.    Data Reviewed: I have personally reviewed following labs and imaging studies Basic Metabolic Panel: Recent Labs  Lab 09/20/19 1040 09/21/19 0416 09/22/19 0458 09/22/19 1410 09/22/19 1415 09/22/19 1715  NA 134* 137 136 132* 134*  --   K 4.0 4.2 3.6 3.9 3.8  --   CL 97* 92* 88*  --   --   --   CO2 25 33* 34*  --   --   --   GLUCOSE 227* 128* 178*  --   --   --   BUN 11 12 15   --   --   --   CREATININE 0.72 0.88 1.02  --   --  1.08  CALCIUM 8.8* 9.2 9.3  --   --   --   MG  --  1.6* 1.9  --   --   --    Liver Function Tests: Recent Labs  Lab 09/20/19 1040  AST 23  ALT 21  ALKPHOS 72  BILITOT 0.6  PROT 6.9  ALBUMIN 3.9   No results for input(s): LIPASE, AMYLASE in the last 168 hours. No results for input(s): AMMONIA in the last 168 hours. Coagulation Profile: No results for input(s): INR, PROTIME in the last 168 hours. CBC: Recent Labs  Lab 09/20/19 1040 09/21/19 0416 09/22/19 0458 09/22/19 1410 09/22/19 1415 09/22/19 1715  WBC 9.0 6.9 5.3  --   --  5.9  NEUTROABS 7.3  --   --   --   --   --   HGB 13.8 12.3* 12.6* 13.3 13.3 12.7*  HCT 44.8 39.8 40.7 39.0 39.0 40.0  MCV 87.8 86.1 86.2  --   --  84.7  PLT 277 217 225  --   --  241   Cardiac Enzymes: No results for input(s): CKTOTAL, CKMB, CKMBINDEX, TROPONINI in the last 168 hours. BNP: Invalid input(s): POCBNP CBG: Recent Labs  Lab 09/21/19 1611 09/21/19 2132 09/22/19 0823 09/22/19 1117 09/22/19 1652  GLUCAP 159* 212* 167* 177* 173*   HbA1C: No results for input(s): HGBA1C in the last 72 hours. Urine analysis:    Component Value Date/Time   COLORURINE YELLOW 11/02/2015 1550   APPEARANCEUR HAZY (A) 11/02/2015 1550    LABSPEC 1.010 11/02/2015 1550   PHURINE 7.5 11/02/2015 1550   GLUCOSEU NEGATIVE 11/02/2015 1550   HGBUR LARGE (A) 11/02/2015 1550   BILIRUBINUR NEGATIVE 11/02/2015 1550   KETONESUR NEGATIVE 11/02/2015 1550   PROTEINUR 30 (A) 11/02/2015 1550   UROBILINOGEN 0.2 08/24/2013 1129   NITRITE NEGATIVE 11/02/2015 1550   LEUKOCYTESUR LARGE (A) 11/02/2015 1550   Sepsis Labs: @LABRCNTIP (procalcitonin:4,lacticidven:4) ) Recent Results (from the past 240 hour(s))  Respiratory Panel by RT PCR (Flu A&B, Covid) - Nasopharyngeal Swab     Status: None   Collection Time: 09/20/19 10:27 AM   Specimen: Nasopharyngeal Swab  Result Value Ref Range Status   SARS Coronavirus 2 by RT PCR NEGATIVE NEGATIVE Final    Comment: (NOTE) SARS-CoV-2 target nucleic acids are NOT DETECTED. The SARS-CoV-2 RNA is generally detectable in upper respiratoy  specimens during the acute phase of infection. The lowest concentration of SARS-CoV-2 viral copies this assay can detect is 131 copies/mL. A negative result does not preclude SARS-Cov-2 infection and should not be used as the sole basis for treatment or other patient management decisions. A negative result may occur with  improper specimen collection/handling, submission of specimen other than nasopharyngeal swab, presence of viral mutation(s) within the areas targeted by this assay, and inadequate number of viral copies (<131 copies/mL). A negative result must be combined with clinical observations, patient history, and epidemiological information. The expected result is Negative. Fact Sheet for Patients:  PinkCheek.be Fact Sheet for Healthcare Providers:  GravelBags.it This test is not yet ap proved or cleared by the [place] FDA and  has been authorized for detection and/or diagnosis of SARS-CoV-2 by FDA under an Emergency Use Authorization (EUA). This EUA will remain  in effect (meaning this test can  be used) for the duration of the COVID-19 declaration under Section 564(b)(1) of the Act, 21 U.S.C. section 360bbb-3(b)(1), unless the authorization is terminated or revoked sooner.    Influenza A by PCR NEGATIVE NEGATIVE Final   Influenza B by PCR NEGATIVE NEGATIVE Final    Comment: (NOTE) The Xpert Xpress SARS-CoV-2/FLU/RSV assay is intended as an aid in  the diagnosis of influenza from Nasopharyngeal swab specimens and  should not be used as a sole basis for treatment. Nasal washings and  aspirates are unacceptable for Xpert Xpress SARS-CoV-2/FLU/RSV  testing. Fact Sheet for Patients: PinkCheek.be Fact Sheet for Healthcare Providers: GravelBags.it This test is not yet approved or cleared by the [place] FDA and  has been authorized for detection and/or diagnosis of SARS-CoV-2 by  FDA under an Emergency Use Authorization (EUA). This EUA will remain  in effect (meaning this test can be used) for the duration of the  Covid-19 declaration under Section 564(b)(1) of the Act, 21  U.S.C. section 360bbb-3(b)(1), unless the authorization is  terminated or revoked. Performed at [facility], [address] [place], [place] [address]   MRSA PCR Screening     Status: None   Collection Time: 09/20/19  2:33 PM   Specimen: Nasal Mucosa; Nasopharyngeal  Result Value Ref Range Status   MRSA by PCR NEGATIVE NEGATIVE Final    Comment:        The GeneXpert MRSA Assay (FDA approved for NASAL specimens only), is one component of a comprehensive MRSA colonization surveillance program. It is not intended to diagnose MRSA infection nor to guide or monitor treatment for MRSA infections. Performed at [facility], [address], [place], [place] [address]      Scheduled Meds: . aspirin EC  81 mg Oral Daily  . carvedilol  6.25 mg Oral BID  . Chlorhexidine Gluconate Cloth  6 each Topical Q0600  . [START ON 09/23/2019] enoxaparin  (LOVENOX) injection  40 mg Subcutaneous Q24H  . ferrous sulfate  325 mg Oral Daily  . furosemide  40 mg Oral Daily  . gabapentin  400 mg Oral TID  . insulin aspart  0-15 Units Subcutaneous TID WC  . insulin aspart  0-5 Units Subcutaneous QHS  . insulin glargine  5 Units Subcutaneous Q2200  . levothyroxine  100 mcg Oral Q0600  . [START ON 09/23/2019] losartan  25 mg Oral Daily  . magnesium oxide  400 mg Oral BID  . mouth rinse  15 mL Mouth Rinse BID  . pantoprazole  40 mg Oral Daily  . potassium chloride  10 mEq  Oral BID  . pravastatin  40 mg Oral QPM  . sodium chloride flush  3 mL Intravenous Q12H  . sodium chloride flush  3 mL Intravenous Q12H   Continuous Infusions: . sodium chloride    . sodium chloride      Procedures/Studies: CARDIAC CATHETERIZATION  Result Date: 65/68/1275  LV end diastolic pressure is normal.  1. Mild nonobstructive CAD 2. Normal LV filling pressures 3. Normal right heart pressures. 4. Normal cardiac output. Index 2.7. Plan: will switch IV lasix to po. Optimize CHF therapy. Would anticipate DC in am if stable.   DG Chest Portable 1 View  Result Date: 09/20/2019 CLINICAL DATA:  Shortness of breath EXAM: PORTABLE CHEST 1 VIEW COMPARISON:  10/05/2015 FINDINGS: Leads overlie the chest particularly the left chest. Cardiomediastinal contours remain enlarged. Increased retrocardiac density is noted. Increased interstitial markings are noted bilaterally diffusely throughout the chest. Visualized skeletal structures are unremarkable. Upper abdominal bowel gas projects over the left hemidiaphragm on this AP lordotic view. IMPRESSION: 1. Diffuse interstitial prominence likely edema. 2. Increase density in the retrocardiac region may represent atelectasis or developing infection. 3. Bowel gas pattern unusual with some bowel loops appearing to project above the left hemidiaphragm, this is of uncertain significance though could be related to lordotic positioning, consider  further evaluation with abdominal radiographs. Electronically Signed   By: Zetta Bills M.D.   On: 09/20/2019 10:55   ECHOCARDIOGRAM COMPLETE  Result Date: 09/20/2019   ECHOCARDIOGRAM REPORT   Patient Name:   Michael [name] Date of Exam: 09/20/2019 Medical Rec #:  [id number]       Height:       59.0 in Accession #:    [id number]      Weight:       196.2 lb Date of Birth:  [birth year]        BSA:          1.83 m Patient Age:    62 years        BP:           158/136 mmHg Patient Gender: M               HR:           117 bpm. Exam Location:  [facility] Procedure: 2D Echo, Cardiac Doppler and Color Doppler Indications:    CHF-Acute Diastolic 466.59 / D35.70  History:        Patient has prior history of Echocardiogram examinations, most                 recent 10/05/2015. CHF; Risk Factors:Hypertension, Dyslipidemia                 and Diabetes. Patient on BiPAP w/ face mask.  Sonographer:    Alvino Chapel RCS Referring Phys: [id number] [place]  1. Left ventricular ejection fraction, by visual estimation, is <20%. The left ventricle has severely decreased function. There is no left ventricular hypertrophy.  2. The left ventricle demonstrates regional wall motion abnormalities.  3. LVEF is approximately 15 to 20% with severe hypokinesis and akinesis if the distal 1/3 of left ventricle.. Only extreme proximal portions of LV thicken during systole. Compared to echo from 2017, LVEF is now severely depressed.  4. Global right ventricle has normal systolic function.The right ventricular size is normal. No increase in right ventricular wall thickness.  5. Left atrial size was normal.  6. Right atrial size was normal.  7. Mild mitral annular  calcification.  8. The mitral valve is abnormal. Trivial mitral valve regurgitation.  9. The tricuspid valve is normal in structure. Tricuspid valve regurgitation is mild. 10. The aortic valve is abnormal. Aortic valve regurgitation is not visualized. Mild aortic valve  sclerosis without stenosis. 11. The pulmonic valve was normal in structure. Pulmonic valve regurgitation is not visualized. 12. The inferior vena cava is dilated in size with <50% respiratory variability, suggesting right atrial pressure of 15 mmHg. FINDINGS  Left Ventricle: Left ventricular ejection fraction, by visual estimation, is <20%. The left ventricle has severely decreased function. The left ventricle demonstrates regional wall motion abnormalities. There is no left ventricular hypertrophy. LVEF is approximately 15 to 20% with severe hypokinesis and akinesis if the distal 1/3 of left ventricle.. Only extreme proximal portions of LV thicken during systole. Compared to echo from 2017, LVEF is now severely depressed. Right Ventricle: The right ventricular size is normal. No increase in right ventricular wall thickness. Global RV systolic function is has normal systolic function. Left Atrium: Left atrial size was normal in size. Right Atrium: Right atrial size was normal in size Pericardium: There is no evidence of pericardial effusion. Mitral Valve: The mitral valve is abnormal. There is mild thickening of the mitral valve leaflet(s). Mild mitral annular calcification. Trivial mitral valve regurgitation. MV peak gradient, 6.2 mmHg. Tricuspid Valve: The tricuspid valve is normal in structure. Tricuspid valve regurgitation is mild. Aortic Valve: The aortic valve is abnormal. Aortic valve regurgitation is not visualized. Mild aortic valve sclerosis is present, with no evidence of aortic valve stenosis. Pulmonic Valve: The pulmonic valve was normal in structure. Pulmonic valve regurgitation is not visualized. Pulmonic regurgitation is not visualized. Aorta: The aortic root is normal in size and structure. Venous: The inferior vena cava is dilated in size with less than 50% respiratory variability, suggesting right atrial pressure of 15 mmHg. IAS/Shunts: No atrial level shunt detected by color flow Doppler.  LEFT  VENTRICLE PLAX 2D LVIDd:         4.95 cm LVIDs:         4.45 cm LV PW:         1.08 cm LV IVS:        1.02 cm LVOT diam:     1.90 cm LV SV:         25 ml LV SV Index:   12.88 LVOT Area:     2.84 cm  LV Volumes (MOD) LV area d, A2C:    30.50 cm LV area d, A4C:    32.90 cm LV area s, A2C:    24.20 cm LV area s, A4C:    27.30 cm LV major d, A2C:   8.38 cm LV major d, A4C:   8.29 cm LV major s, A2C:   7.55 cm LV major s, A4C:   7.93 cm LV vol d, MOD A2C: 92.9 ml LV vol d, MOD A4C: 107.0 ml LV vol s, MOD A2C: 69.1 ml LV vol s, MOD A4C: 80.7 ml LV SV MOD A2C:     23.8 ml LV SV MOD A4C:     107.0 ml LV SV MOD BP:      23.9 ml RIGHT VENTRICLE RV S prime:     10.90 cm/s TAPSE (M-mode): 1.8 cm LEFT ATRIUM             Index       RIGHT ATRIUM           Index LA diam:  3.70 cm 2.02 cm/m  RA Area:     14.20 cm LA Vol (A2C):   85.1 ml 46.52 ml/m RA Volume:   34.70 ml  18.97 ml/m LA Vol (A4C):   55.8 ml 30.50 ml/m LA Biplane Vol: 71.6 ml 39.14 ml/m  AORTIC VALVE LVOT Vmax:   67.40 cm/s LVOT Vmean:  46.200 cm/s LVOT VTI:    0.108 m  AORTA Ao Root diam: 3.30 cm MITRAL VALVE MV Area (PHT): 3.91 cm             SHUNTS MV Peak grad:  6.2 mmHg             Systemic VTI:  0.11 m MV Mean grad:  2.0 mmHg             Systemic Diam: 1.90 cm MV Vmax:       1.24 m/s MV Vmean:      57.3 cm/s MV VTI:        0.22 m MV PHT:        56.26 msec MV Decel Time: 194 msec MV E velocity: 109.00 cm/s 103 cm/s  Dorris Carnes MD Electronically signed by Dorris Carnes MD Signature Date/Time: 09/20/2019/10:52:05 PM    Final     Kathie Dike, MD  [facility]   If 7PM-7AM, please contact night-coverage [email or web address]  09/22/2019, 6:22 PM   LOS: 2 days

## 2019-09-22 NOTE — Progress Notes (Signed)
Transferred in from the cath lab by bed awake and alert, TR band to right wrist in placed. Instructed to elevate right arm with pillow and avoid too much movement, pulse ox probe to right thumb. Continue to monitor.

## 2019-09-22 NOTE — Progress Notes (Signed)
ANTICOAGULATION CONSULT NOTE   Pharmacy Consult for heparin Indication: chest pain/ACS/STEMI  No Known Allergies  Patient Measurements: Height: 4' 11"  (149.9 cm) Weight: 194 lb 14.2 oz (88.4 kg) IBW/kg (Calculated) : 47.7 Heparin Dosing Weight: 68 kg  Vital Signs: Temp: 98.1 F (36.7 C) (12/23 0000) Temp Source: Oral (12/23 0000) BP: 121/69 (12/23 0100) Pulse Rate: 88 (12/23 0100)  Labs: Recent Labs    09/20/19 1040 09/20/19 1905 09/21/19 0007 09/21/19 0209 09/21/19 0416 09/21/19 1451 09/21/19 2354  HGB 13.8  --   --   --  12.3*  --   --   HCT 44.8  --   --   --  39.8  --   --   PLT 277  --   --   --  217  --   --   HEPARINUNFRC  --   --   --   --  0.32 0.13* 0.17*  CREATININE 0.72  --   --   --  0.88  --   --   TROPONINIHS 77* 693* 744* 663*  --   --   --     Estimated Creatinine Clearance: 68.7 mL/min (by C-G formula based on SCr of 0.88 mg/dL).   Assessment: Patient is a 56 yom with a chief complaint of SOB thought to be from [facility] exacerbation. Troponins are elevated so pharmacy is consulted to dose heparin.  No anticoagulation noted PTA.   Heparin level remains subtherapeutic (0.17) on gtt at 1000 units/hr. No issues with line or bleeding reported per RN.  Goal of Therapy:  Heparin level 0.3-0.7 units/ml Monitor platelets by anticoagulation protocol: Yes   Plan:  Rebolus 2000 units IV x 1. Increase heparin infusion to 1250 units/hr Check anti-Xa level in 8 hours and daily while on heparin Continue to monitor H&H and platelets  Sherlon Handing, PharmD, BCPS Please see amion for complete clinical pharmacist phone list 09/22/2019 1:03 AM

## 2019-09-22 NOTE — Progress Notes (Signed)
TR band removed, gauze 2x2 and tegaderm applied. No bleeding noted.

## 2019-09-22 NOTE — H&P (View-Only) (Signed)
Cardiology Consult    Patient ID: Michael [name]; [id number]; 1946-07-22   Admit date: 09/20/2019 Date of Consult: 09/22/2019  Primary Care Provider: [name], MD Primary Cardiologist: [name], MD   Patient Profile    Michael [name] is a 73 y.o. male with past medical history of chronic diastolic CHF, HTN, HLD, Type 2 DM and COPD who is being seen today for the evaluation of CHF and newly reduced EF at the request of Dr. Carles Collet.   History of Present Illness    Michael [name] most recently had a telehealth visit with Dr. Harl Bowie on 09/02/2019 and had been experiencing worsening edema over the past few months. Weight had peaked at 206 lbs but was trending down with further titration of diuretic therapy. Weight was 196 lbs at the time of his visit and he was on Bumex 70m BID along with Metolazone 2.58mthree times weekly. He had a lymphedema machine at home and was using this intermittently. He was continued on his current regimen at that time.   [facility] for evaluation of worsening dyspnea starting earlier that day, initially requiring BiPAP while in the ED. He developed acute dyspnea while showering that morning and was unable to catch his breath, therefore he asked his caregiver to call EMS. He denies any associated chest pain or palpitations. No recent orthopnea or PND. He has chronic lymphedema and is mostly wheelchair bound but does stand to pivot for transfers. Says he was not taking his Bumex as prescribed due to frequent urination.   Initial labs showed WBC 9.0, Hgb 13.8, platelets 277, Na+ 134, K+ 4.0, and creatinine 0.72. BNP 233. Initial HS Troponin 77 with repeat values of 270, 628, 693, 744 and 663. COVID negative. CXR showing pulmonary edema and possible atelectasis versus developing infection. EKG shows sinus tachycardia, HR 108 with new diffuse TWI along the anterolateral leads. An echocardiogram was obtained and shows a reduced EF  of 15-20% with severe hypokinesis and akinesis if the distal 1/3 of left ventricle. Normal RV function with trivial MR and mild TR.   He has been receiving IV Lasix 8081mID along with Metolazone 2.5mg54mily. He has a recorded net output of -9.2 L thus far (-3.8L within the past 24 hours). Creatinine is starting to trend upwards from 0.72 --> 0.88 --> 1.02. Weight has declined from 203 lbs on admission to 188 lbs today. He has been continued on PTA Benazepril 40mg19mly and Coreg 6.25mg 39m   He reports significant improvement in his dyspnea since admission. Is anxious to go home for the holidays.    Past Medical History:  Diagnosis Date  . Arthritis   . Congestive heart disease (HCC) [id number]  . COPD (chronic obstructive pulmonary disease) (HCC)  [place] (degenerative disc disease), lumbosacral   . Depression   . Diabetic neuropathy (HCC)  [place] dysfunction   . Essential hypertension   . GERD (gastroesophageal reflux disease)   . Hyperlipidemia   . Iron deficiency anemia   . Left knee DJD   . Lumbar spinal stenosis   . Lymphedema 11/2015   BOTH LEGS  . NASH (nonalcoholic steatohepatitis)   . Peripheral edema   . Peripheral edema 10/05/2015  . Type 2 diabetes mellitus (HCC)  [place]:  Procedure Laterality Date  . BIOPSY N/A 07/21/2014   Procedure: BIOPSY;  Surgeon: RobertDaneil Dolin Location: [facility];  Service: Endoscopy;  Laterality: N/A;  . CATARACT EXTRACTION W/PHACO Right 07/08/2016   Procedure: CATARACT EXTRACTION PHACO AND INTRAOCULAR LENS PLACEMENT RIGHT EYE;  Surgeon: Tonny Branch, MD;  Location: [facility];  Service: Ophthalmology;  Laterality: Right;  CDE: 9.01  . CATARACT EXTRACTION W/PHACO Left 07/22/2016   Procedure: CATARACT EXTRACTION PHACO AND INTRAOCULAR LENS PLACEMENT LEFT EYE CDE=10.69;  Surgeon: Tonny Branch, MD;  Location: [facility];  Service: Ophthalmology;  Laterality: Left;  left -   . COLONOSCOPY  2011   [facility]: normal colon, normal distal  ileum  . COLONOSCOPY WITH PROPOFOL N/A 07/21/2014   Procedure: ATTEMPTED COLONOSCOPY WITH PROPOFOL-HAD TO STOP DUE TO BRADYCARDIA;  Surgeon: Daneil Dolin, MD;  Location: [facility];  Service: Endoscopy;  Laterality: N/A;  . EGD with enteroscopy  2011   [facility]: normal esophagus and stomach. Normal duodenum, jejunum. No evidence of AVMs.   . ESOPHAGOGASTRODUODENOSCOPY (EGD) WITH PROPOFOL N/A 07/21/2014   Procedure: ESOPHAGOGASTRODUODENOSCOPY (EGD) WITH PROPOFOL;  Surgeon: Daneil Dolin, MD;  Location: [facility];  Service: Endoscopy;  Laterality: N/A;  . HERNIA REPAIR    . KNEE ARTHROSCOPY WITH MEDIAL MENISECTOMY Left 11/06/2012   Procedure: KNEE ARTHROSCOPY WITH MEDIAL MENISECTOMY;  Surgeon: Carole Civil, MD;  Location: [facility];  Service: Orthopedics;  Laterality: Left;  . Lipoma removal     Stomach  . LUMBAR LAMINECTOMY/DECOMPRESSION MICRODISCECTOMY Left 08/30/2013   Procedure: LUMBAR LAMINECTOMY/DECOMPRESSION MICRODISCECTOMY LEFT  LUMBAR TWO THREE;  Surgeon: Otilio Connors, MD;  Location: [facility];  Service: Neurosurgery;  Laterality: Left;  . SHOULDER SURGERY     Rght-rotator cuff  . TOOTH EXTRACTION       Home Medications:  Prior to Admission medications   Medication Sig Start Date End Date Taking? Authorizing Provider  aspirin EC 81 MG tablet Take 81 mg by mouth daily.   Yes [provider]  B-D ULTRAFINE III SHORT PEN 31G X 8 MM MISC USE AS DIRECTED DAILY WITH LANTUS. Patient taking differently: See admin instructions. USE AS DIRECTED DAILY WITH LANTUS 05/17/16  Yes [place], Modena Nunnery, MD  benazepril (LOTENSIN) 40 MG tablet TAKE ONE TABLET BY MOUTH DAILY. 05/27/16  Yes [place], Modena Nunnery, MD  Blood Glucose Monitoring Suppl (BLOOD GLUCOSE SYSTEM PAK) KIT Please dispense based on patient and insurance preference. Use as directed to monitor FSBS 2x daily. Dx: E11.9 08/20/18  Yes [place], Modena Nunnery, MD  bumetanide (BUMEX) 2 MG tablet TAKE 1&1/2 TABLETS BY MOUTH TWICE  DAILY. Patient taking differently: Take 2 mg by mouth 2 (two) times daily.  06/17/16  Yes Branch, Alphonse Guild, MD  carvedilol (COREG) 6.25 MG tablet Take 1 tablet (6.25 mg total) by mouth 2 (two) times daily. 09/02/19  Yes BranchAlphonse Guild, MD  diclofenac sodium (VOLTAREN) 1 % GEL Apply 4 g topically 4 (four) times daily as needed (pain). 03/06/17  Yes [place], Modena Nunnery, MD  Ferrous Sulfate (IRON) 325 (65 FE) MG TABS Take 1 tablet by mouth daily.    Yes [provider]  gabapentin (NEURONTIN) 400 MG capsule Take 1 capsule (400 mg total) by mouth 3 (three) times daily. 03/04/18  Yes [place], Modena Nunnery, MD  Glucose Blood (BLOOD GLUCOSE TEST STRIPS) STRP Please dispense based on patient and insurance preference. Use as directed to monitor FSBS 2x daily. Dx: E11.9 08/20/18  Yes , Modena Nunnery, MD  Insulin Glargine (LANTUS SOLOSTAR) 100 UNIT/ML Solostar Pen Inject 5 Units into the skin daily at 10 pm. 03/06/18  Yes  [place], Modena Nunnery, MD  Lancets MISC Please dispense based on patient and insurance preference. Use as directed to monitor FSBS 2x daily. Dx: E11.9 08/20/18  Yes [place], Modena Nunnery, MD  levothyroxine (SYNTHROID, LEVOTHROID) 100 MCG tablet TAKE 1 TABLET BY MOUTH DAILY BEFORE BREAKFAST. Patient taking differently: Take 100 mcg by mouth daily before breakfast.  07/06/18  Yes [place], Cammie Mcgee, MD  magnesium oxide (MAG-OX) 400 MG tablet Take 1 tablet (400 mg total) by mouth 2 (two) times daily. 11/19/17  Yes Imogene Burn, PA-C  metFORMIN (GLUCOPHAGE) 1000 MG tablet TAKE ONE TABLET BY MOUTH TWICE DAILY WITH A MEAL. 11/07/15  Yes [name], MD  metolazone (ZAROXOLYN) 2.5 MG tablet TAKE EVERY Tuesday, Thursday & Saturday. 12/09/18  Yes Branch, Alphonse Guild, MD  nystatin cream (MYCOSTATIN) APPLY TO AFFECTED AREA TWICE DAILY. 03/04/18  Yes [place], Modena Nunnery, MD  oxyCODONE-acetaminophen (PERCOCET) 7.5-325 MG tablet TAKE 1 TABLET BY MOUTH EVERY 6 HOURS AS NEEDED FOR SEVERE PAIN. Patient taking  differently: Take 1 tablet by mouth every 6 (six) hours as needed for severe pain.  06/30/18  Yes Susy Frizzle, MD  pantoprazole (PROTONIX) 40 MG tablet Take 1 tablet (40 mg total) by mouth daily. 11/04/17  Yes Branch, Alphonse Guild, MD  potassium chloride (K-DUR) 10 MEQ tablet Take 1 tablet (10 mEq total) by mouth 2 (two) times daily. 11/19/17  Yes Imogene Burn, PA-C  pravastatin (PRAVACHOL) 40 MG tablet TAKE ONE TABLET BY MOUTH DAILY. Patient taking differently: TAKE ONE TABLET BY MOUTH DAILY IN THE EVENING 05/21/16  Yes [place], Modena Nunnery, MD  traZODone (DESYREL) 100 MG tablet TAKE 1 TABLET BY MOUTH AT BEDTIME AS NEEDED FOR SLEEP. 11/18/18  Yes [place], Modena Nunnery, MD  triamcinolone cream (KENALOG) 0.1 % Apply 1 application topically 2 (two) times daily. To arms/hand 11/18/18  Yes [place], Modena Nunnery, MD    Inpatient Medications: Scheduled Meds: . aspirin EC  81 mg Oral Daily  . carvedilol  6.25 mg Oral BID  . Chlorhexidine Gluconate Cloth  6 each Topical Q0600  . ferrous sulfate  325 mg Oral Daily  . furosemide  80 mg Intravenous Q12H  . gabapentin  400 mg Oral TID  . heparin  1,000 Units Intravenous Once  . insulin aspart  0-15 Units Subcutaneous TID WC  . insulin aspart  0-5 Units Subcutaneous QHS  . insulin glargine  5 Units Subcutaneous Q2200  . levothyroxine  100 mcg Oral Q0600  . [START ON 09/23/2019] losartan  25 mg Oral Daily  . magnesium oxide  400 mg Oral BID  . mouth rinse  15 mL Mouth Rinse BID  . pantoprazole  40 mg Oral Daily  . potassium chloride  10 mEq Oral BID  . pravastatin  40 mg Oral QPM  . sodium chloride flush  3 mL Intravenous Q12H   Continuous Infusions: . sodium chloride    . heparin 1,250 Units/hr (09/22/19 0849)   PRN Meds: sodium chloride, acetaminophen **OR** acetaminophen, diclofenac sodium, labetalol, levalbuterol, ondansetron **OR** ondansetron (ZOFRAN) IV, oxyCODONE-acetaminophen, sodium chloride flush, traZODone  Allergies:   No Known  Allergies  Social History:   Social History   Socioeconomic History  . Marital status: Widowed    Spouse name: Not on file  . Number of children: Not on file  . Years of education: 38  . Highest education level: Not on file  Occupational History  . Not on file  Tobacco Use  . Smoking status: Former Smoker  Packs/day: 2.50    Years: 44.00    Pack years: 110.00    Types: Cigarettes    Start date: 05/07/1955    Quit date: 10/01/1999    Years since quitting: 19.9  . Smokeless tobacco: Never Used  Substance and Sexual Activity  . Alcohol use: No    Alcohol/week: 0.0 standard drinks  . Drug use: No  . Sexual activity: Yes    Birth control/protection: None  Other Topics Concern  . Not on file  Social History Narrative  . Not on file   Social Determinants of Health   Financial Resource Strain:   . Difficulty of Paying Living Expenses: Not on file  Food Insecurity:   . Worried About Charity fundraiser in the Last Year: Not on file  . Ran Out of Food in the Last Year: Not on file  Transportation Needs:   . Lack of Transportation (Medical): Not on file  . Lack of Transportation (Non-Medical): Not on file  Physical Activity:   . Days of Exercise per Week: Not on file  . Minutes of Exercise per Session: Not on file  Stress:   . Feeling of Stress : Not on file  Social Connections:   . Frequency of Communication with Friends and Family: Not on file  . Frequency of Social Gatherings with Friends and Family: Not on file  . Attends Religious Services: Not on file  . Active Member of Clubs or Organizations: Not on file  . Attends Archivist Meetings: Not on file  . Marital Status: Not on file  Intimate Partner Violence:   . Fear of Current or Ex-Partner: Not on file  . Emotionally Abused: Not on file  . Physically Abused: Not on file  . Sexually Abused: Not on file     Family History:    Family History  Problem Relation Age of Onset  . Heart disease Mother    . Hyperlipidemia Mother   . Hypertension Mother   . Depression Mother   . Diabetes Mother   . Rectal cancer Mother   . Cancer Mother   . Heart disease Father   . Hypertension Father   . Hyperlipidemia Father   . Diabetes Father   . Cancer Father   . Heart disease Sister   . Hyperlipidemia Sister   . Hypertension Sister   . Diabetes Sister   . Diabetes Brother   . Heart disease Sister   . Hyperlipidemia Sister   . Hypertension Sister   . Heart disease Sister   . Hyperlipidemia Sister   . Hypertension Sister   . Diabetes Sister   . Diabetes Brother       Review of Systems    General:  No chills, fever, night sweats or weight changes.  Cardiovascular:  No chest pain, orthopnea, palpitations, paroxysmal nocturnal dyspnea. Positive for dyspnea on exertion and edema.  Dermatological: No rash, lesions/masses Respiratory: No cough, dyspnea Urologic: No hematuria, dysuria Abdominal:   No nausea, vomiting, diarrhea, bright red blood per rectum, melena, or hematemesis Neurologic:  No visual changes, wkns, changes in mental status. All other systems reviewed and are otherwise negative except as noted above.  Physical Exam/Data    Vitals:   09/22/19 0700 09/22/19 0800 09/22/19 0809 09/22/19 0830  BP: 120/70 119/74    Pulse: 87 86 95 (!) 101  Resp: 15 13 19 20   Temp:   98.3 F (36.8 C)   TempSrc:   Oral   SpO2: 95% 96%  96% 93%  Weight:      Height:        Intake/Output Summary (Last 24 hours) at 09/22/2019 1048 Last data filed at 09/22/2019 0849 Gross per 24 hour  Intake 895.84 ml  Output 4175 ml  Net -3279.16 ml   Filed Weights   09/20/19 1723 09/21/19 0500 09/22/19 0500  Weight: 92.1 kg 88.4 kg 85.6 kg   Body mass index is 38.12 kg/m.   General: Pleasant Caucasian male appearing in NAD Psych: Normal affect. Neuro: Alert and oriented X 3. Moves all extremities spontaneously. HEENT: Normal  Neck: Supple without bruits. JVD at 9 cm. Lungs:  Resp regular and  unlabored, decreased along bases bilaterally. Heart: RRR no s3, s4, or murmurs. Abdomen: Soft, non-tender, non-distended, BS + x 4.  Extremities: No clubbing or cyanosis. Chronic lymphedema with dressings in place. DP/PT/Radials 2+ and equal bilaterally.   EKG:  The EKG was personally reviewed and demonstrates: Sinus tachycardia, HR 108 with new diffuse TWI along the anterolateral leads.  Telemetry:  Telemetry was personally reviewed and demonstrates: NSR, HR in 80's to 110's with brief episodes of narrow-complex tachycardia, most consistent with atrial tachycardia. No definitive atrial fibrillation or flutter.    Labs/Studies     Relevant CV Studies:  Echocardiogram: 10/2015 Study Conclusions  - Left ventricle: The cavity size was mildly dilated. Wall   thickness was increased in a pattern of mild LVH. Systolic   function was normal. The estimated ejection fraction was in the   range of 50% to 55%. Wall motion was normal; there were no   regional wall motion abnormalities. Doppler parameters are   consistent with abnormal left ventricular relaxation (grade 1   diastolic dysfunction). - Mitral valve: Calcified annulus. Mildly thickened leaflets . - Atrial septum: No defect or patent foramen ovale was identified.   Echocardiogram: 09/20/2019 IMPRESSIONS    1. Left ventricular ejection fraction, by visual estimation, is <20%. The left ventricle has severely decreased function. There is no left ventricular hypertrophy.  2. The left ventricle demonstrates regional wall motion abnormalities.  3. LVEF is approximately 15 to 20% with severe hypokinesis and akinesis if the distal 1/3 of left ventricle.. Only extreme proximal portions of LV thicken during systole. Compared to echo from 2017, LVEF is now severely depressed.  4. Global right ventricle has normal systolic function.The right ventricular size is normal. No increase in right ventricular wall thickness.  5. Left atrial size  was normal.  6. Right atrial size was normal.  7. Mild mitral annular calcification.  8. The mitral valve is abnormal. Trivial mitral valve regurgitation.  9. The tricuspid valve is normal in structure. Tricuspid valve regurgitation is mild. 10. The aortic valve is abnormal. Aortic valve regurgitation is not visualized. Mild aortic valve sclerosis without stenosis. 11. The pulmonic valve was normal in structure. Pulmonic valve regurgitation is not visualized. 12. The inferior vena cava is dilated in size with <50% respiratory variability, suggesting right atrial pressure of 15 mmHg.  Laboratory Data:  Chemistry Recent Labs  Lab 09/20/19 1040 09/21/19 0416 09/22/19 0458  NA 134* 137 136  K 4.0 4.2 3.6  CL 97* 92* 88*  CO2 25 33* 34*  GLUCOSE 227* 128* 178*  BUN 11 12 15   CREATININE 0.72 0.88 1.02  CALCIUM 8.8* 9.2 9.3  GFRNONAA >60 >60 >60  GFRAA >60 >60 >60  ANIONGAP 12 12 14     Recent Labs  Lab 09/20/19 1040  PROT 6.9  ALBUMIN 3.9  AST  23  ALT 21  ALKPHOS 72  BILITOT 0.6   Hematology Recent Labs  Lab 09/20/19 1040 09/21/19 0416 09/22/19 0458  WBC 9.0 6.9 5.3  RBC 5.10 4.62 4.72  HGB 13.8 12.3* 12.6*  HCT 44.8 39.8 40.7  MCV 87.8 86.1 86.2  MCH 27.1 26.6 26.7  MCHC 30.8 30.9 31.0  RDW 15.6* 15.4 15.4  PLT 277 217 225   Cardiac EnzymesNo results for input(s): TROPONINI in the last 168 hours. No results for input(s): TROPIPOC in the last 168 hours.  BNP Recent Labs  Lab 09/20/19 1040  BNP 233.0*    DDimer No results for input(s): DDIMER in the last 168 hours.  Radiology/Studies:  DG Chest Portable 1 View  Result Date: 09/20/2019 CLINICAL DATA:  Shortness of breath EXAM: PORTABLE CHEST 1 VIEW COMPARISON:  10/05/2015 FINDINGS: Leads overlie the chest particularly the left chest. Cardiomediastinal contours remain enlarged. Increased retrocardiac density is noted. Increased interstitial markings are noted bilaterally diffusely throughout the chest.  Visualized skeletal structures are unremarkable. Upper abdominal bowel gas projects over the left hemidiaphragm on this AP lordotic view. IMPRESSION: 1. Diffuse interstitial prominence likely edema. 2. Increase density in the retrocardiac region may represent atelectasis or developing infection. 3. Bowel gas pattern unusual with some bowel loops appearing to project above the left hemidiaphragm, this is of uncertain significance though could be related to lordotic positioning, consider further evaluation with abdominal radiographs. Electronically Signed   By: Zetta Bills M.D.   On: 09/20/2019 10:55   ECHOCARDIOGRAM COMPLETE  Result Date: 09/20/2019   ECHOCARDIOGRAM REPORT   Patient Name:   Michael [name] Date of Exam: 09/20/2019 Medical Rec #:  [id number]       Height:       59.0 in Accession #:    [id number]      Weight:       196.2 lb Date of Birth:  Feb 03, 1946        BSA:          1.83 m Patient Age:    32 years        BP:           158/136 mmHg Patient Gender: M               HR:           117 bpm. Exam Location:  [facility] Procedure: 2D Echo, Cardiac Doppler and Color Doppler Indications:    CHF-Acute Diastolic 852.77 / O24.23  History:        Patient has prior history of Echocardiogram examinations, most                 recent 10/05/2015. CHF; Risk Factors:Hypertension, Dyslipidemia                 and Diabetes. Patient on BiPAP w/ face mask.  Sonographer:    Alvino Chapel RCS Referring Phys: [id number] [place]  1. Left ventricular ejection fraction, by visual estimation, is <20%. The left ventricle has severely decreased function. There is no left ventricular hypertrophy.  2. The left ventricle demonstrates regional wall motion abnormalities.  3. LVEF is approximately 15 to 20% with severe hypokinesis and akinesis if the distal 1/3 of left ventricle.. Only extreme proximal portions of LV thicken during systole. Compared to echo from 2017, LVEF is now severely depressed.  4. Global right  ventricle has normal systolic function.The right ventricular size is normal. No increase in right ventricular wall thickness.  5. Left atrial size was normal.  6. Right atrial size was normal.  7. Mild mitral annular calcification.  8. The mitral valve is abnormal. Trivial mitral valve regurgitation.  9. The tricuspid valve is normal in structure. Tricuspid valve regurgitation is mild. 10. The aortic valve is abnormal. Aortic valve regurgitation is not visualized. Mild aortic valve sclerosis without stenosis. 11. The pulmonic valve was normal in structure. Pulmonic valve regurgitation is not visualized. 12. The inferior vena cava is dilated in size with <50% respiratory variability, suggesting right atrial pressure of 15 mmHg. FINDINGS  Left Ventricle: Left ventricular ejection fraction, by visual estimation, is <20%. The left ventricle has severely decreased function. The left ventricle demonstrates regional wall motion abnormalities. There is no left ventricular hypertrophy. LVEF is approximately 15 to 20% with severe hypokinesis and akinesis if the distal 1/3 of left ventricle.. Only extreme proximal portions of LV thicken during systole. Compared to echo from 2017, LVEF is now severely depressed. Right Ventricle: The right ventricular size is normal. No increase in right ventricular wall thickness. Global RV systolic function is has normal systolic function. Left Atrium: Left atrial size was normal in size. Right Atrium: Right atrial size was normal in size Pericardium: There is no evidence of pericardial effusion. Mitral Valve: The mitral valve is abnormal. There is mild thickening of the mitral valve leaflet(s). Mild mitral annular calcification. Trivial mitral valve regurgitation. MV peak gradient, 6.2 mmHg. Tricuspid Valve: The tricuspid valve is normal in structure. Tricuspid valve regurgitation is mild. Aortic Valve: The aortic valve is abnormal. Aortic valve regurgitation is not visualized. Mild aortic  valve sclerosis is present, with no evidence of aortic valve stenosis. Pulmonic Valve: The pulmonic valve was normal in structure. Pulmonic valve regurgitation is not visualized. Pulmonic regurgitation is not visualized. Aorta: The aortic root is normal in size and structure. Venous: The inferior vena cava is dilated in size with less than 50% respiratory variability, suggesting right atrial pressure of 15 mmHg. IAS/Shunts: No atrial level shunt detected by color flow Doppler.  LEFT VENTRICLE PLAX 2D LVIDd:         4.95 cm LVIDs:         4.45 cm LV PW:         1.08 cm LV IVS:        1.02 cm LVOT diam:     1.90 cm LV SV:         25 ml LV SV Index:   12.88 LVOT Area:     2.84 cm  LV Volumes (MOD) LV area d, A2C:    30.50 cm LV area d, A4C:    32.90 cm LV area s, A2C:    24.20 cm LV area s, A4C:    27.30 cm LV major d, A2C:   8.38 cm LV major d, A4C:   8.29 cm LV major s, A2C:   7.55 cm LV major s, A4C:   7.93 cm LV vol d, MOD A2C: 92.9 ml LV vol d, MOD A4C: 107.0 ml LV vol s, MOD A2C: 69.1 ml LV vol s, MOD A4C: 80.7 ml LV SV MOD A2C:     23.8 ml LV SV MOD A4C:     107.0 ml LV SV MOD BP:      23.9 ml RIGHT VENTRICLE RV S prime:     10.90 cm/s TAPSE (M-mode): 1.8 cm LEFT ATRIUM             Index  RIGHT ATRIUM           Index LA diam:        3.70 cm 2.02 cm/m  RA Area:     14.20 cm LA Vol (A2C):   85.1 ml 46.52 ml/m RA Volume:   34.70 ml  18.97 ml/m LA Vol (A4C):   55.8 ml 30.50 ml/m LA Biplane Vol: 71.6 ml 39.14 ml/m  AORTIC VALVE LVOT Vmax:   67.40 cm/s LVOT Vmean:  46.200 cm/s LVOT VTI:    0.108 m  AORTA Ao Root diam: 3.30 cm MITRAL VALVE MV Area (PHT): 3.91 cm             SHUNTS MV Peak grad:  6.2 mmHg             Systemic VTI:  0.11 m MV Mean grad:  2.0 mmHg             Systemic Diam: 1.90 cm MV Vmax:       1.24 m/s MV Vmean:      57.3 cm/s MV VTI:        0.22 m MV PHT:        56.26 msec MV Decel Time: 194 msec MV E velocity: 109.00 cm/s 103 cm/s  Dorris Carnes MD Electronically signed by Dorris Carnes MD  Signature Date/Time: 09/20/2019/10:52:05 PM    Final      Assessment & Plan    1. Acute on Chronic Combined Systolic and Diastolic CHF/ New Cardiomyopathy - presented with worsening dyspnea and BNP found to be elevated to 233 on admission and CXR consistent with CHF. Echo shows a reduced EF of 15-20% with severe hypokinesis and akinesis if the distal 1/3 of left ventricle which is new compared to prior imaging in 2017. - he has responded well to IV Lasix 64m BID along with Metolazone 2.510mdaily. He has a recorded net output of -9.2 L thus far (-3.8L within the past 24 hours). Creatinine is starting to trend upwards from 0.72 --> 0.88 --> 1.02. Weight has declined from 203 lbs on admission to 188 lbs today (reported baseline of 185 lbs). Will hold Metolazone given rise in creatinine.  - given his cardiomyopathy, would [facility]. The patient understands that risks include but are not limited to stroke (1 in 1000), death (1 in 1098 kidney failure [usually temporary] (1 in 500), bleeding (1 in 200), allergic reaction [possibly serious] (1 in 200). He was initially very hesitant to remain in the hospital as he wants to be home for the holidays and preferred an outpatient procedure but upon reviewing the indications for the procedure in regards to his cardiomyopathy and elevated troponin, he is open to proceeding this admission but would like to discuss with his niece who is coming to visit around noon today. Tentatively added to the cath board tomorrow morning at 0900. [facility]  - remains on Coreg and Benazepril. Will review with Dr. RoHarrington Challengern regards to continuing BB if concern for low-output HF. Will transition Benazepril to Losartan to allow wash out for possible initiation of Entresto prior to discharge.   2. NSTEMI - HS Troponin values have been elevated at 77, 270, 628, 693, 744 and 663 this admission. EKG shows diffuse TWI  along the anterolateral leads. He denies any recent chest pain but had experienced acute worsening dyspnea prior to admission.  - would recommend R/[facility] outlined above.  - continue Heparin  along with ASA, BB, and statin therapy.   3. HTN - BP has overall been well-controlled within the past 24 hours, at 119/74 on most recent check. Currentlt on Coreg 6.57m BID and Benazepril 492mdaily. Will transition Benazepril to Losartan as [facility]   4. HLD - followed by PCP. FLP in 08/2019 showed total cholesterol of 133, HDL 41 and LDL 69. Remains on Pravastatin 4087maily.   5. Type 2 DM - Hgb A1c at 6.8 in 08/2019.   For questions or updates, please contact [facility] consult [email or web address] for contact info under Cardiology/STEMI.  Signed, BriErma HeritageA-C 09/22/2019, 10:48 AM Pager: [phone] seen and examined   I agree with findings as noted by B Strader above Pt is a [age] with hx diastolic CHF, HTN, HL, DM, lymphedema and COPD   Has noted progressive SOB and edema   Seen as televisit early in Dec by J Branch   Diuretics increased.  Presented to ED on Mon with severe Dyspnea.  Echo Monday showed LVEF now severely depressed (<20%).   Pt denies CP   SInce admit he has diuresed significantly ON exam:  JVP is approx 9   Lungs are relatively clear   Cardac exam:  RRR  +S3  PMI diffuse Abdomen is supple  No hepatomegaly  Ext :  Legs wrapped  Still 1+edema   Chronic skin changes EKG with no Q waves    T wave inversion anterolateally      Discussed with pt and wife  Need to eval for CAD    Would recomm R and L heart cath to define anatomy and pressures    Risks explained  Pt understands an agrees to proceed Plan for today   Tx to [employer]epril with plans to transition to [employer]Dor[employer] Carnes

## 2019-09-22 NOTE — Progress Notes (Signed)
ANTICOAGULATION CONSULT NOTE   Pharmacy Consult for heparin Indication: chest pain/ACS/STEMI  No Known Allergies  Patient Measurements: Height: 4' 11"  (149.9 cm) Weight: 188 lb 11.4 oz (85.6 kg) IBW/kg (Calculated) : 47.7 Heparin Dosing Weight: 68 kg  Vital Signs: Temp: 98.3 F (36.8 C) (12/23 0809) Temp Source: Oral (12/23 0809) BP: 119/74 (12/23 0800) Pulse Rate: 101 (12/23 0830)  Labs: Recent Labs    09/20/19 1040 09/20/19 1040 09/20/19 1905 09/21/19 0007 09/21/19 0209 09/21/19 0416 09/21/19 1451 09/21/19 2354 09/22/19 0458 09/22/19 0951  HGB 13.8  --   --   --   --  12.3*  --   --  12.6*  --   HCT 44.8  --   --   --   --  39.8  --   --  40.7  --   PLT 277  --   --   --   --  217  --   --  225  --   HEPARINUNFRC  --    < >  --   --   --  0.32 0.13* 0.17*  --  0.26*  CREATININE 0.72  --   --   --   --  0.88  --   --  1.02  --   TROPONINIHS 77*  --  693* 744* 663*  --   --   --   --   --    < > = values in this interval not displayed.    Estimated Creatinine Clearance: 58.2 mL/min (by C-G formula based on SCr of 1.02 mg/dL).   Assessment: Patient is a 31 yom with a chief complaint of SOB thought to be from [facility] exacerbation. Troponins are elevated so pharmacy is consulted to dose heparin.  No anticoagulation noted PTA.   Heparin level remains subtherapeutic (0.26) on gtt at 1250 units/hr. No issues with line or bleeding reported per RN. HL trending upward.  Goal of Therapy:  Heparin level 0.3-0.7 units/ml Monitor platelets by anticoagulation protocol: Yes   Plan:  Rebolus 1000 units IV x 1. Increase heparin infusion to 1400 units/hr Check anti-Xa level in 8 hours and daily while on heparin Continue to monitor H&H and platelets  Isac Sarna, BS Vena Austria, BCPS Clinical Pharmacist Pager [phone]  09/22/2019 10:28 AM

## 2019-09-23 ENCOUNTER — Encounter: Payer: Self-pay | Admitting: Cardiology

## 2019-09-23 DIAGNOSIS — I248 Other forms of acute ischemic heart disease: Secondary | ICD-10-CM

## 2019-09-23 DIAGNOSIS — I214 Non-ST elevation (NSTEMI) myocardial infarction: Secondary | ICD-10-CM

## 2019-09-23 DIAGNOSIS — I5032 Chronic diastolic (congestive) heart failure: Secondary | ICD-10-CM

## 2019-09-23 DIAGNOSIS — I2489 Other forms of acute ischemic heart disease: Secondary | ICD-10-CM

## 2019-09-23 DIAGNOSIS — E782 Mixed hyperlipidemia: Secondary | ICD-10-CM

## 2019-09-23 DIAGNOSIS — I10 Essential (primary) hypertension: Secondary | ICD-10-CM

## 2019-09-23 DIAGNOSIS — J810 Acute pulmonary edema: Secondary | ICD-10-CM

## 2019-09-23 DIAGNOSIS — J9601 Acute respiratory failure with hypoxia: Secondary | ICD-10-CM

## 2019-09-23 HISTORY — DX: Other forms of acute ischemic heart disease: I24.8

## 2019-09-23 HISTORY — DX: Other forms of acute ischemic heart disease: I24.89

## 2019-09-23 LAB — BASIC METABOLIC PANEL
Anion gap: 10 (ref 5–15)
BUN: 12 mg/dL (ref 8–23)
CO2: 33 mmol/L — ABNORMAL HIGH (ref 22–32)
Calcium: 8.9 mg/dL (ref 8.9–10.3)
Chloride: 90 mmol/L — ABNORMAL LOW (ref 98–111)
Creatinine, Ser: 1.16 mg/dL (ref 0.61–1.24)
GFR calc Af Amer: >60 mL/min (ref >60)
GFR calc non Af Amer: >60 mL/min (ref >60)
Glucose, Bld: 164 mg/dL — ABNORMAL HIGH (ref 70–99)
Potassium: 3.9 mmol/L (ref 3.5–5.1)
Sodium: 133 mmol/L — ABNORMAL LOW (ref 135–145)

## 2019-09-23 LAB — CBC
HCT: 38.9 % — ABNORMAL LOW (ref 39.0–52.0)
Hemoglobin: 12.7 g/dL — ABNORMAL LOW (ref 13.0–17.0)
MCH: 27 pg (ref 26.0–34.0)
MCHC: 32.6 g/dL (ref 30.0–36.0)
MCV: 82.8 fL (ref 80.0–100.0)
Platelets: 223 10*3/uL (ref 150–400)
RBC: 4.7 MIL/uL (ref 4.22–5.81)
RDW: 15 % (ref 11.5–15.5)
WBC: 5.9 10*3/uL (ref 4.0–10.5)
nRBC: 0 % (ref 0.0–0.2)

## 2019-09-23 LAB — GLUCOSE, CAPILLARY
Glucose-Capillary: 166 mg/dL — ABNORMAL HIGH (ref 70–99)
Glucose-Capillary: 255 mg/dL — ABNORMAL HIGH (ref 70–99)

## 2019-09-23 MED ORDER — FUROSEMIDE 40 MG PO TABS: 40.0000 mg | ORAL_TABLET | Freq: Every day | ORAL | 1 refills | Status: DC

## 2019-09-23 MED ORDER — LOSARTAN POTASSIUM 25 MG PO TABS: 25.0000 mg | ORAL_TABLET | Freq: Every day | ORAL | 2 refills | Status: DC

## 2019-09-23 MED ORDER — METFORMIN HCL 1000 MG PO TABS: ORAL_TABLET | ORAL | 3 refills | Status: DC

## 2019-09-23 MED FILL — LOSARTAN POTASSIUM 25 MG TA: 25 | 30 days supply | Qty: 30 | Fill #0

## 2019-09-23 MED FILL — FUROSEMIDE 40 MG TABLET: 40 | 30 days supply | Qty: 30 | Fill #0

## 2019-09-23 NOTE — Discharge Summary (Addendum)
Discharge Summary    Patient ID: Michael [name] MRN: [id number]; DOB: 1946/07/04  Admit date: 09/20/2019 Discharge date: 09/23/2019  Primary Care Provider: [name], MD  Primary Cardiologist: [name], MD  Primary Electrophysiologist:  None   Discharge Diagnoses    Principal Problem:   Acute on chronic combined systolic and diastolic CHF (congestive heart failure) ([place]) Active Problems:   Acute hypoxemic respiratory failure ([place])   Demand ischemia ([place])   Type II diabetes mellitus with neurological manifestations ([place])   Hyperlipidemia   Essential hypertension   Chronic pain syndrome    Diagnostic Studies/Procedures    Echocardiogram 09/20/2019:  Impressions:   1. Left ventricular ejection fraction, by visual estimation, is <20%. The left ventricle has severely decreased function. There is no left ventricular hypertrophy.   2. The left ventricle demonstrates regional wall motion abnormalities.   3. LVEF is approximately 15 to 20% with severe hypokinesis and akinesis if the distal 1/3 of left ventricle.. Only extreme proximal portions of LV thicken during systole. Compared to echo from 2017, LVEF is now severely depressed.   4. Global right ventricle has normal systolic function.The right ventricular size is normal. No increase in right ventricular wall thickness.   5. Left atrial size was normal.   6. Right atrial size was normal.   7. Mild mitral annular calcification.   8. The mitral valve is abnormal. Trivial mitral valve regurgitation.   9. The tricuspid valve is normal in structure. Tricuspid valve regurgitation is mild.  10. The aortic valve is abnormal. Aortic valve regurgitation is not visualized. Mild aortic valve sclerosis without stenosis.  11. The pulmonic valve was normal in structure. Pulmonic valve regurgitation is not visualized.  12. The inferior vena cava is dilated in size with <50% respiratory variability, suggesting right atrial pressure  of 15 mmHg.  _______________   Right/Left Heart Catheterization 09/22/2019:   LV end diastolic pressure is normal.     1. Mild nonobstructive CAD  2. Normal LV filling pressures  3. Normal right heart pressures.  4. Normal cardiac output. Index 2.7.        Plan: will switch IV lasix to po. Optimize CHF therapy. Would anticipate DC in am if stable.   _____________   History of Present Illness     [name] is a 73 year old male with a history of chronic diastolic CHF, hypertension, hyperlipidemia, type 2 diabetes mellitus,  and COPD who is followed by Dr. Harl Bowie.   HTN, HLD, Type 2 DM and COPD who is being seen today for the evaluation of CHF and newly reduced EF at the request of Dr. Carles Collet.  Patient was most recently seen by Dr. Harl Bowie for a virtual visit on 09/02/2019 at which time he reported  Mr. Hitchens most recently had a telehealth visit with Dr. Harl Bowie on 09/02/2019 worsening edema over the past few months. Weight had peaked at 206 lbs but was trending down with further titration of diuretic therapy. Weight was 196 lbs at the time of his visit and he was on Bumex 40m twice daily along with Metolazone 2.515mthree times weekly. He had a lymphedema machine at home and was using this intermittently. He was continued on his current regimen at that time.     [facility] of worsening dyspnea starting earlier that day initially requiring BiPAP while in the ED. He developed acute dyspnea while showering that morning and was unable to catch his  breath; therefore, he asked his caregiver to call EMS. He denied any associated chest pain or palpitations. No recent orthopnea or PND. He has chronic lymphedema and is mostly wheelchair bound but does stand to pivot for transfers. He reported he was not taking his Bumex as prescribed prior to presentation due to frequent urination.     Initial labs showed WBC 9.0, Hgb 13.8, platelets 277, Na+ 134, K+ 4.0,  and creatinine 0.72. BNP 233. Initial HS Troponin 77 with repeat values of 270, 628, 693, 744 and 663. COVID negative. CXR showing pulmonary edema and possible atelectasis versus developing infection. EKG showed sinus tachycardia, rate 108. with new diffuse TWI along the anterolateral leads.  He was admitted under Internal Medicine service and was started on IV Lasix 87m twice daily and Metolazone 2.586mdaily. An Echo was obtained and showed a reduced EF of 15-20% with severe hypokinesis and akinesis if the distal 1/3 of left ventricle. Normal RV function with trivial MR and mild TR.   Cardiology was consulted given recently reduced EF. [facility]    [facility] Course     Consultants: None.  Acute on Chronic Combined CHF with Newly Reduced EF and Acute Respiratory Failure  Patient presented with worsening dyspnea, initially requiring BiPAP, as stated above. BNP found to be elevated to 233 and chest x-ray was consistent with CHF. Echo showed a reduced EF of 15-20% with severe hypokinesis and akinesis if the distal 1/3 of left ventricle which is new compared to prior imaging in 2017. He was diuresed with IV Lasix 8055mwice daily and [facility]. [facility] right/left heart catheterization on 09/22/2019 which showed mild non-obstructive CAD with normal filling pressures and cardiac output. Therefore, patient transitioned to PO Lasix and Metolazone was stopped. Net output of negative 9.8 L since admission. Discharge weight 188 lbs, down from 203 lbs on admission. Patient being discharged on PO Lasix 40mg69mly (was on Bumex 3mg t43me daily and Metolazone 2.5 three times weekly prior to admission but was not taking as prescribed due to irritation from increased urination). Home Benazepril was stopped and patient was started on Losartan 25mg d79m.  [facility] Continue home Coreg 6.25mg tw76mdaily. Patient will need repeat Echo in about 3 months to reassess EF. If EF still low at that time, may need ICD. Have arranged for close follow-[place] Discussed the importance of weighing self daily and salt restriction and to call of us if he[place]oreaotices weight gain, worsening shortness of breath, or worsening edema.     Demand Ischemia  High-sensitivity troponin peaked at 744 and EKG did show diffuse T wave inversion along anterolateral leads. Patient was started on IV Heparin due to concern for significant CAD given newly reduced EF. Cardiac catheterization only showed mild non-obstructive disease. Likely demand ischemia in setting of acute CHF with reduced EF. Continue medical therapy with aspirin, beta-blocker, and statin. Patient tolerated the procedure well. Right radial cath site soft with no signs of hematoma and renal function stable.  Hypertension  BP has been mostly well controlled this admission. Benazepril stopped and Losartan 25mg sta34m as stated above in anticipation of starting Entresto as outpatient. Continue Coreg 6.25mg twic74mily.     Hyperlipidemia  Recent lipid panel in 08/2019: Total Cholesterol 133, HDL 41, LDL 69. Continue home  Pravastatin 31m daily.     Type 2 Diabetes Mellitus  Hemoglobin A1c at 6.8 in 08/2019. Will restart home regimen at discharge. Can restart Metformin 48 hours after cardiac catheterization. Follow-up with PCP.   Patient seen and examined by Dr. BGwenlyn Foundtoday and determined to be stable for discharge. Close follow-up has been arranged. Medications as below.    Did the patient have an acute coronary syndrome (MI, NSTEMI, STEMI, etc) this admission?:  No.   The elevated Troponin was due to the acute medical illness (demand ischemia).  _____________  Discharge Vitals Blood pressure 122/78, pulse (!) 106, temperature 99.5 F (37.5 C),  temperature source Oral, resp. rate 18, height _0  (1.499 m), weight 85.3 kg, SpO2 96 %.  Filed Weights   09/21/19 0500 09/22/19 0500 09/23/19 0551  Weight: 88.4 kg 85.6 kg 85.3 kg    Labs & Radiologic Studies    CBC Recent Labs    09/22/19 1715 09/23/19 0211  WBC 5.9 5.9  HGB 12.7* 12.7*  HCT 40.0 38.9*  MCV 84.7 82.8  PLT 241 2932  Basic Metabolic Panel Recent Labs    09/21/19 0416 09/22/19 0458 09/22/19 1415 09/22/19 1715 09/23/19 0211  NA 137 136 134*  --  133*  K 4.2 3.6 3.8  --  3.9  CL 92* 88*  --   --  90*  CO2 33* 34*  --   --  33*  GLUCOSE 128* 178*  --   --  164*  BUN 12 15  --   --  12  CREATININE 0.88 1.02  --  1.08 1.16  CALCIUM 9.2 9.3  --   --  8.9  MG 1.6* 1.9  --   --   --    Liver Function Tests No results for input(s): AST, ALT, ALKPHOS, BILITOT, PROT, ALBUMIN in the last 72 hours. No results for input(s): LIPASE, AMYLASE in the last 72 hours. High Sensitivity Troponin:   Recent Labs  Lab 09/20/19 1250 09/20/19 1652 09/20/19 1905 09/21/19 0007 09/21/19 0209  TROPONINIHS 270* 628* 693* 744* 663*    BNP Invalid input(s): POCBNP D-Dimer No results for input(s): DDIMER in the last 72 hours. Hemoglobin A1C No results for input(s): HGBA1C in the last 72 hours. Fasting Lipid Panel No results for input(s): CHOL, HDL, LDLCALC, TRIG, CHOLHDL, LDLDIRECT in the last 72 hours. Thyroid Function Tests No results for input(s): TSH, T4TOTAL, T3FREE, THYROIDAB in the last 72 hours.  Invalid input(s): FREET3 _____________  CARDIAC CATHETERIZATION  Result Date: 135/57/3220 LV end diastolic pressure is normal.  1. Mild nonobstructive CAD 2. Normal LV filling pressures 3. Normal right heart pressures. 4. Normal cardiac output. Index 2.7. Plan: will switch IV lasix to po. Optimize CHF therapy. Would anticipate DC in am if stable.   DG Chest Portable 1 View  Result Date: 09/20/2019 CLINICAL DATA:  Shortness of breath EXAM: PORTABLE CHEST 1 VIEW  COMPARISON:  10/05/2015 FINDINGS: Leads overlie the chest particularly the left chest. Cardiomediastinal contours remain enlarged. Increased retrocardiac density is noted. Increased interstitial markings are noted bilaterally diffusely throughout the chest. Visualized skeletal structures are unremarkable. Upper abdominal bowel gas projects over the left hemidiaphragm on this AP lordotic view. IMPRESSION: 1. Diffuse interstitial prominence likely edema. 2. Increase density in the retrocardiac region may represent atelectasis or developing infection. 3. Bowel gas pattern unusual with some bowel loops appearing to project above the left hemidiaphragm, this is of uncertain significance though could be related to lordotic positioning,  consider further evaluation with abdominal radiographs. Electronically Signed   By: Zetta Bills M.D.   On: 09/20/2019 10:55   ECHOCARDIOGRAM COMPLETE  Result Date: 09/20/2019   ECHOCARDIOGRAM REPORT   Patient Name:   ISAIS KLIPFEL Date of Exam: 09/20/2019 Medical Rec #:  [id number]       Height:       59.0 in Accession #:    [id number]      Weight:       196.2 lb Date of Birth:  04-23-1946        BSA:          1.83 m Patient Age:    52 years        BP:           158/136 mmHg Patient Gender: M               HR:           117 bpm. Exam Location:  [facility] Procedure: 2D Echo, Cardiac Doppler and Color Doppler Indications:    CHF-Acute Diastolic 728.20 / U01.56  History:        Patient has prior history of Echocardiogram examinations, most                 recent 10/05/2015. CHF; Risk Factors:Hypertension, Dyslipidemia                 and Diabetes. Patient on BiPAP w/ face mask.  Sonographer:    Alvino Chapel RCS Referring Phys: [id number] Nettie D [place]  1. Left ventricular ejection fraction, by visual estimation, is <20%. The left ventricle has severely decreased function. There is no left ventricular hypertrophy.  2. The left ventricle demonstrates regional wall motion  abnormalities.  3. LVEF is approximately 15 to 20% with severe hypokinesis and akinesis if the distal 1/3 of left ventricle.. Only extreme proximal portions of LV thicken during systole. Compared to echo from 2017, LVEF is now severely depressed.  4. Global right ventricle has normal systolic function.The right ventricular size is normal. No increase in right ventricular wall thickness.  5. Left atrial size was normal.  6. Right atrial size was normal.  7. Mild mitral annular calcification.  8. The mitral valve is abnormal. Trivial mitral valve regurgitation.  9. The tricuspid valve is normal in structure. Tricuspid valve regurgitation is mild. 10. The aortic valve is abnormal. Aortic valve regurgitation is not visualized. Mild aortic valve sclerosis without stenosis. 11. The pulmonic valve was normal in structure. Pulmonic valve regurgitation is not visualized. 12. The inferior vena cava is dilated in size with <50% respiratory variability, suggesting right atrial pressure of 15 mmHg. FINDINGS  Left Ventricle: Left ventricular ejection fraction, by visual estimation, is <20%. The left ventricle has severely decreased function. The left ventricle demonstrates regional wall motion abnormalities. There is no left ventricular hypertrophy. LVEF is approximately 15 to 20% with severe hypokinesis and akinesis if the distal 1/3 of left ventricle.. Only extreme proximal portions of LV thicken during systole. Compared to echo from 2017, LVEF is now severely depressed. Right Ventricle: The right ventricular size is normal. No increase in right ventricular wall thickness. Global RV systolic function is has normal systolic function. Left Atrium: Left atrial size was normal in size. Right Atrium: Right atrial size was normal in size Pericardium: There is no evidence of pericardial effusion. Mitral Valve: The mitral valve is abnormal. There is mild thickening of the mitral valve leaflet(s). Mild mitral annular calcification.  Trivial mitral valve regurgitation. MV peak gradient, 6.2 mmHg. Tricuspid Valve: The tricuspid valve is normal in structure. Tricuspid valve regurgitation is mild. Aortic Valve: The aortic valve is abnormal. Aortic valve regurgitation is not visualized. Mild aortic valve sclerosis is present, with no evidence of aortic valve stenosis. Pulmonic Valve: The pulmonic valve was normal in structure. Pulmonic valve regurgitation is not visualized. Pulmonic regurgitation is not visualized. Aorta: The aortic root is normal in size and structure. Venous: The inferior vena cava is dilated in size with less than 50% respiratory variability, suggesting right atrial pressure of 15 mmHg. IAS/Shunts: No atrial level shunt detected by color flow Doppler.  LEFT VENTRICLE PLAX 2D LVIDd:         4.95 cm LVIDs:         4.45 cm LV PW:         1.08 cm LV IVS:        1.02 cm LVOT diam:     1.90 cm LV SV:         25 ml LV SV Index:   12.88 LVOT Area:     2.84 cm  LV Volumes (MOD) LV area d, A2C:    30.50 cm LV area d, A4C:    32.90 cm LV area s, A2C:    24.20 cm LV area s, A4C:    27.30 cm LV major d, A2C:   8.38 cm LV major d, A4C:   8.29 cm LV major s, A2C:   7.55 cm LV major s, A4C:   7.93 cm LV vol d, MOD A2C: 92.9 ml LV vol d, MOD A4C: 107.0 ml LV vol s, MOD A2C: 69.1 ml LV vol s, MOD A4C: 80.7 ml LV SV MOD A2C:     23.8 ml LV SV MOD A4C:     107.0 ml LV SV MOD BP:      23.9 ml RIGHT VENTRICLE RV S prime:     10.90 cm/s TAPSE (M-mode): 1.8 cm LEFT ATRIUM             Index       RIGHT ATRIUM           Index LA diam:        3.70 cm 2.02 cm/m  RA Area:     14.20 cm LA Vol (A2C):   85.1 ml 46.52 ml/m RA Volume:   34.70 ml  18.97 ml/m LA Vol (A4C):   55.8 ml 30.50 ml/m LA Biplane Vol: 71.6 ml 39.14 ml/m  AORTIC VALVE LVOT Vmax:   67.40 cm/s LVOT Vmean:  46.200 cm/s LVOT VTI:    0.108 m  AORTA Ao Root diam: 3.30 cm MITRAL VALVE MV Area (PHT): 3.91 cm             SHUNTS MV Peak grad:  6.2 mmHg             Systemic VTI:  0.11 m MV  Mean grad:  2.0 mmHg             Systemic Diam: 1.90 cm MV Vmax:       1.24 m/s MV Vmean:      57.3 cm/s MV VTI:        0.22 m MV PHT:        56.26 msec MV Decel Time: 194 msec MV E velocity: 109.00 cm/s 103 cm/s  Dorris Carnes MD Electronically signed by Dorris Carnes MD Signature Date/Time: 09/20/2019/10:52:05 PM    Final    Disposition  Patient is being discharged home today in good condition.  Follow-up Plans & Appointments    Follow-up Information     [facility] Follow up.   Specialty: Cardiology Why: You have a follow-up visit scheduled for 09/27/2019 at 2:30pm with Cecilie Kicks, NP. Please arrive 15 minutes early for check-in. Contact information: [place] [phone]          Discharge Instructions     Diet - low sodium heart healthy   Complete by: As directed    Increase activity slowly   Complete by: As directed        Discharge Medications   Allergies as of 09/23/2019   No Known Allergies      Medication List     STOP taking these medications    benazepril 40 MG tablet Commonly known as: LOTENSIN   bumetanide 2 MG tablet Commonly known as: BUMEX   metolazone 2.5 MG tablet Commonly known as: ZAROXOLYN       TAKE these medications    aspirin EC 81 MG tablet Take 81 mg by mouth daily.   B-D ULTRAFINE III SHORT PEN 31G X 8 MM Misc Generic drug: Insulin Pen Needle USE AS DIRECTED DAILY WITH LANTUS. What changed: See the new instructions.   Blood Glucose System Pak Kit Please dispense based on patient and insurance preference. Use as directed to monitor FSBS 2x daily. Dx: E11.9   BLOOD GLUCOSE TEST STRIPS Strp Please dispense based on patient and insurance preference. Use as directed to monitor FSBS 2x daily. Dx: E11.9   carvedilol 6.25 MG tablet Commonly known as: COREG Take 1 tablet (6.25 mg total) by mouth 2 (two) times daily.   diclofenac sodium 1 % Gel Commonly known as:  Voltaren Apply 4 g topically 4 (four) times daily as needed (pain).   furosemide 40 MG tablet Commonly known as: LASIX Take 1 tablet (40 mg total) by mouth daily. Start taking on: September 24, 2019   gabapentin 400 MG capsule Commonly known as: NEURONTIN Take 1 capsule (400 mg total) by mouth 3 (three) times daily.   Insulin Glargine 100 UNIT/ML Solostar Pen Commonly known as: Lantus SoloStar Inject 5 Units into the skin daily at 10 pm.   Iron 325 (65 Fe) MG Tabs Take 1 tablet by mouth daily.   Lancets Misc Please dispense based on patient and insurance preference. Use as directed to monitor FSBS 2x daily. Dx: E11.9   levothyroxine 100 MCG tablet Commonly known as: SYNTHROID TAKE 1 TABLET BY MOUTH DAILY BEFORE BREAKFAST. What changed: See the new instructions.   losartan 25 MG tablet Commonly known as: COZAAR Take 1 tablet (25 mg total) by mouth daily. Start taking on: September 24, 2019   magnesium oxide 400 MG tablet Commonly known as: MAG-OX Take 1 tablet (400 mg total) by mouth 2 (two) times daily.   metFORMIN 1000 MG tablet Commonly known as: GLUCOPHAGE TAKE ONE TABLET BY MOUTH TWICE DAILY WITH A MEAL. Start taking on: September 24, 2019   nystatin cream Commonly known as: MYCOSTATIN APPLY TO AFFECTED AREA TWICE DAILY.   oxyCODONE-acetaminophen 7.5-325 MG tablet Commonly known as: PERCOCET TAKE 1 TABLET BY MOUTH EVERY 6 HOURS AS NEEDED FOR SEVERE PAIN. What changed: See the new instructions.   pantoprazole 40 MG tablet Commonly known as: PROTONIX Take 1 tablet (40 mg total) by mouth daily.   potassium chloride 10 MEQ tablet Commonly known as: KLOR-CON Take 1 tablet (10 mEq total)  by mouth 2 (two) times daily.   pravastatin 40 MG tablet Commonly known as: PRAVACHOL TAKE ONE TABLET BY MOUTH DAILY. What changed:  how much to take how to take this when to take this   traZODone 100 MG tablet Commonly known as: DESYREL TAKE 1 TABLET BY MOUTH AT BEDTIME  AS NEEDED FOR SLEEP.   triamcinolone cream 0.1 % Commonly known as: KENALOG Apply 1 application topically 2 (two) times daily. To arms/hand           Outstanding Labs/Studies   Needs BMET at follow-up visit.  Duration of Discharge Encounter   Greater than 30 minutes including physician time.  Signed, Darreld Mclean, PA-C 09/23/2019, 12:54 PM  Agree with note by Sande Rives, PA-C  Mr. Holzman is stable for discharge.  He was transferred from [facility] with volume overload.  He is a patient of Dr. Devra Dopp.  He was diuresed 10 L.  His EF was demonstrated to be approximate 20%, significantly reduced compared to several years ago and was normal.  He underwent right and left heart cath by Dr. [place] yesterday revealing minimal CAD with fairly normal filling pressures suggesting adequate diuresis.  He appears euvolemic on exam.  We talked about the importance of medication compliance and salt restriction.  He can follow-up with an APP in a week and with Dr. Harl Bowie in 3 to 4 weeks.    Lorretta Harp, M.D., [place], [facility], Laverta [place] Bruin [address] [place], [place]  [address]  [phone] 09/23/2019 2:07 PM

## 2019-09-23 NOTE — Discharge Instructions (Signed)
Heart Failure Education: 1. Weigh yourself EVERY morning after you go to the bathroom but before you eat or drink anything. Write this number down in a weight log/diary. If you gain 3 pounds overnight or 5 pounds in a week, call the office. 2. Take your medicines as prescribed. If you have concerns about your medications, please call us before you stop taking them.  3. Eat low salt foods--Limit salt (sodium) to 2000 mg per day. This will help prevent your body from holding onto fluid. Read food labels as many processed foods have a lot of sodium, especially canned goods and prepackaged meats. If you would like some assistance choosing low sodium foods, we would be happy to set you up with a nutritionist. 4. Stay as active as you can everyday. Staying active will give you more energy and make your muscles stronger. Start with 5 minutes at a time and work your way up to 30 minutes a day. Break up your activities--do some in the morning and some in the afternoon. Start with 3 days per week and work your way up to 5 days as you can.  If you have chest pain, feel short of breath, dizzy, or lightheaded, STOP. If you don't feel better after a short rest, call 911. If you do feel better, call the office to let us know you have symptoms with exercise. 5. Limit all fluids for the day to less than 2 liters. Fluid includes all drinks, coffee, juice, ice chips, soup, jello, and all other liquids.  _______________  Post Cardiac Catheterization: NO HEAVY LIFTING OR SEXUAL ACTIVITY X 7 DAYS. NO DRIVING X 2-3 DAYS. NO SOAKING BATHS, HOT TUBS, POOLS, ETC., X 7 DAYS.  Radial Site Care: Refer to this sheet in the next few weeks. These instructions provide you with information on caring for yourself after your procedure. Your caregiver may also give you more specific instructions. Your treatment has been planned according to current medical practices, but problems sometimes occur. Call your caregiver if you have any  problems or questions after your procedure. HOME CARE INSTRUCTIONS  You may shower the day after the procedure.Remove the bandage (dressing) and gently wash the site with plain soap and water.Gently pat the site dry.   Do not apply powder or lotion to the site.   Do not submerge the affected site in water for 3 to 5 days.   Inspect the site at least twice daily.   Do not flex or bend the affected arm for 24 hours.   No lifting over 5 pounds (2.3 kg) for 5 days after your procedure.   Do not drive home if you are discharged the same day of the procedure. Have someone else drive you.  What to expect:  Any bruising will usually fade within 1 to 2 weeks.   Blood that collects in the tissue (hematoma) may be painful to the touch. It should usually decrease in size and tenderness within 1 to 2 weeks.  SEEK IMMEDIATE MEDICAL CARE IF:  You have unusual pain at the radial site.   You have redness, warmth, swelling, or pain at the radial site.   You have drainage (other than a small amount of blood on the dressing).   You have chills.   You have a fever or persistent symptoms for more than 72 hours.   You have a fever and your symptoms suddenly get worse.   Your arm becomes pale, cool, tingly, or numb.   You have heavy  bleeding from the site. Hold pressure on the site.

## 2019-09-23 NOTE — Progress Notes (Signed)
Progress Note  Patient Name: Michael [name] Date of Encounter: 09/23/2019  Primary Cardiologist: [name], MD   Subjective   Michael [name] feels clinically improved today.  He is diuresed 10 L.  He denies chest pain or shortness of breath.  Inpatient Medications    Scheduled Meds: . aspirin EC  81 mg Oral Daily  . carvedilol  6.25 mg Oral BID  . Chlorhexidine Gluconate Cloth  6 each Topical Q0600  . enoxaparin (LOVENOX) injection  40 mg Subcutaneous Q24H  . ferrous sulfate  325 mg Oral Daily  . furosemide  40 mg Oral Daily  . gabapentin  400 mg Oral TID  . insulin aspart  0-15 Units Subcutaneous TID WC  . insulin aspart  0-5 Units Subcutaneous QHS  . insulin glargine  5 Units Subcutaneous Q2200  . levothyroxine  100 mcg Oral Q0600  . losartan  25 mg Oral Daily  . magnesium oxide  400 mg Oral BID  . mouth rinse  15 mL Mouth Rinse BID  . pantoprazole  40 mg Oral Daily  . potassium chloride  10 mEq Oral BID  . pravastatin  40 mg Oral QPM  . sodium chloride flush  3 mL Intravenous Q12H  . sodium chloride flush  3 mL Intravenous Q12H   Continuous Infusions: . sodium chloride    . sodium chloride     PRN Meds: sodium chloride, sodium chloride, acetaminophen **OR** acetaminophen, diclofenac sodium, levalbuterol, ondansetron **OR** ondansetron (ZOFRAN) IV, oxyCODONE-acetaminophen, sodium chloride flush, sodium chloride flush, traZODone   Vital Signs    Vitals:   09/22/19 2331 09/23/19 0317 09/23/19 0355 09/23/19 0551  BP: 128/72 128/67    Pulse: 93  94   Resp: 20 18 17    Temp: 98.2 F (36.8 C) 98.8 F (37.1 C)    TempSrc: Oral Oral    SpO2: 93%  96%   Weight:    85.3 kg  Height:        Intake/Output Summary (Last 24 hours) at 09/23/2019 0820 Last data filed at 09/23/2019 0500 Gross per 24 hour  Intake 586.48 ml  Output 1100 ml  Net -513.52 ml   Last 3 Weights 09/23/2019 09/22/2019 09/21/2019  Weight (lbs) 188 lb 0.8 oz 188 lb 11.4 oz 194 lb 14.2 oz   Weight (kg) 85.3 kg 85.6 kg 88.4 kg      Telemetry    Sinus rhythm with PACs and PVCs- Personally Reviewed  ECG    Not performed today- Personally Reviewed  Physical Exam   GEN: No acute distress.   Neck: No JVD Cardiac: RRR, no murmurs, rubs, or gallops.  Respiratory: Clear to auscultation bilaterally. GI: Soft, nontender, non-distended  MS: No edema; No deformity. Neuro:  Nonfocal  Psych: Normal affect   Labs    High Sensitivity Troponin:   Recent Labs  Lab 09/20/19 1250 09/20/19 1652 09/20/19 1905 09/21/19 0007 09/21/19 0209  TROPONINIHS 270* 628* 693* 744* 663*      Chemistry Recent Labs  Lab 09/20/19 1040 09/21/19 0416 09/22/19 0458 09/22/19 1410 09/22/19 1415 09/22/19 1715 09/23/19 0211  NA 134* 137 136 132* 134*  --  133*  K 4.0 4.2 3.6 3.9 3.8  --  3.9  CL 97* 92* 88*  --   --   --  90*  CO2 25 33* 34*  --   --   --  33*  GLUCOSE 227* 128* 178*  --   --   --  164*  BUN 11  12 15  --   --   --  12  CREATININE 0.72 0.88 1.02  --   --  1.08 1.16  CALCIUM 8.8* 9.2 9.3  --   --   --  8.9  PROT 6.9  --   --   --   --   --   --   ALBUMIN 3.9  --   --   --   --   --   --   AST 23  --   --   --   --   --   --   ALT 21  --   --   --   --   --   --   ALKPHOS 72  --   --   --   --   --   --   BILITOT 0.6  --   --   --   --   --   --   GFRNONAA >60 >60 >60  --   --  >60 >60  GFRAA >60 >60 >60  --   --  >60 >60  ANIONGAP 12 12 14   --   --   --  10     Hematology Recent Labs  Lab 09/22/19 0458 09/22/19 1415 09/22/19 1715 09/23/19 0211  WBC 5.3  --  5.9 5.9  RBC 4.72  --  4.72 4.70  HGB 12.6* 13.3 12.7* 12.7*  HCT 40.7 39.0 40.0 38.9*  MCV 86.2  --  84.7 82.8  MCH 26.7  --  26.9 27.0  MCHC 31.0  --  31.8 32.6  RDW 15.4  --  15.1 15.0  PLT 225  --  241 223    BNP Recent Labs  Lab 09/20/19 1040  BNP 233.0*     DDimer No results for input(s): DDIMER in the last 168 hours.   Radiology    CARDIAC CATHETERIZATION  Result Date:  51/70/0174  LV end diastolic pressure is normal.  1. Mild nonobstructive CAD 2. Normal LV filling pressures 3. Normal right heart pressures. 4. Normal cardiac output. Index 2.7. Plan: will switch IV lasix to po. Optimize CHF therapy. Would anticipate DC in am if stable.    Cardiac Studies   2D echocardiogram (09/20/2019)  IMPRESSIONS    1. Left ventricular ejection fraction, by visual estimation, is <20%. The left ventricle has severely decreased function. There is no left ventricular hypertrophy.  2. The left ventricle demonstrates regional wall motion abnormalities.  3. LVEF is approximately 15 to 20% with severe hypokinesis and akinesis if the distal 1/3 of left ventricle.. Only extreme proximal portions of LV thicken during systole. Compared to echo from 2017, LVEF is now severely depressed.  4. Global right ventricle has normal systolic function.The right ventricular size is normal. No increase in right ventricular wall thickness.  5. Left atrial size was normal.  6. Right atrial size was normal.  7. Mild mitral annular calcification.  8. The mitral valve is abnormal. Trivial mitral valve regurgitation.  9. The tricuspid valve is normal in structure. Tricuspid valve regurgitation is mild. 10. The aortic valve is abnormal. Aortic valve regurgitation is not visualized. Mild aortic valve sclerosis without stenosis. 11. The pulmonic valve was normal in structure. Pulmonic valve regurgitation is not visualized. 12. The inferior vena cava is dilated in size with <50% respiratory variability, suggesting right atrial pressure of 15 mmHg.  Cardiac catheterization (09/22/2019)  Conclusion    LV end diastolic pressure is normal.  1. Mild nonobstructive CAD 2. Normal LV filling pressures 3. Normal right heart pressures. 4. Normal cardiac output. Index 2.7.    Patient Profile     Michael [name] is a 73 y.o. male with past medical history of chronic diastolic CHF, HTN, HLD, Type  2 DM and COPD who is followed by Dr. Harl Bowie.  We are seeing him for newly recognized systolic heart failure.  Cardiac catheterization revealed nonobstructive CAD suggesting nonischemic cardiomyopathy.  Assessment & Plan    1: Nonischemic cardiomyopathy-2-week history of increasing dyspnea on exertion, shortness of breath, and weight gain.  2D echo revealed newly recognized decline in LVEF to the 15 to 20% level compared to a prior echo 3 years ago that was normal.  He was diuresed 10 L.  Right left heart cath yesterday revealed nonobstructive disease with normal filling pressures.  Patient is clinically improved.  He is on carvedilol and losartan as well as oral diuretics.  He is aware salt restriction.  2: Non-STEMI-type II MI secondary to demand ischemia related to severe congestive heart failure  3: Essential hypertension-blood pressure well controlled on current medications  4: Hyperlipidemia-on pravastatin followed by his primary cardiologist  Patient is adequately diuresed.  He is on oral furosemide 40 mg a day as well as carvedilol and losartan.  He stable for discharge home today with TOC 7.  He can be transition to [facility] as an outpatient with careful monitoring of blood pressure and renal function.  He is aware of salt restriction.  Return office visit with Dr. Harl Bowie in 3 to 4 weeks.  For questions or updates, please contact [place] Please consult [email or web address] for contact info under        Signed, Quay Burow, MD  09/23/2019, 8:20 AM

## 2019-09-23 NOTE — Progress Notes (Signed)
Pt had dressing renewed to both feet wrapped with coban. Left for home.

## 2019-09-26 NOTE — Progress Notes (Signed)
Cardiology Office Note   Date:  09/27/2019   ID:  DEBRA CALABRETTA, DOB Jul 03, 1946, MRN [id number]  PCP:  [name], MD  Cardiologist:  Dr. Harl Bowie     Chief Complaint  Patient presents with  . Hospitalization Follow-up      History of Present Illness: Michael [name] is a 73 y.o. male who presents for  post hospital.   He has a past medical history of chronic diastolic CHF, HTN, HLD, Type 2 DM and COPD and recently increase edema over last few months. He uses lymphedema machine at home.  .Admitted to [facility] 09/20/19 with increased dyspnea and requried BiPAP, he held off on bumex prior to hospitalization due to freq urination.  BNP of 233 and Troponin to 744.    EKG shows sinus tachycardia, HR 108 with new diffuse TWI along the anterolateral leads. An echocardiogram was obtained and shows a reduced EF of 15-20% with severe hypokinesis and akinesis if the distal 1/3 of left ventricle. Normal RV function with trivial MR and mild TR.  He was diuresed and wt from 203 down to 188 lbs.    He was neg 9.8 L d/c'd on pl lasix 40 mg dialy  bumex and metolazone stopped.   Plan for entresto as outpt.  Off benazepril.  On losartan.   Cath with mild non obstructive disease with normal LV filling pressures, normal Rt heart pressures and normal cardiac output and changed to po laxix.     Today he feels well. No chest pain and no SOB.  His wt is up to 193Lbs he has been monitoring his salt.  We discussed changing losartan to entresto, plan to do after labs.    Check BMP , echo in 3 months after titration of meds.    Past Medical History:  Diagnosis Date  . Arthritis   . Congestive heart disease ([place]) 11/2015  . COPD (chronic obstructive pulmonary disease) ([place])   . DDD (degenerative disc disease), lumbosacral   . Depression   . Diabetic neuropathy ([place])   . Diastolic dysfunction   . Essential hypertension   . GERD (gastroesophageal reflux disease)   . Hyperlipidemia   . Iron  deficiency anemia   . Left knee DJD   . Lumbar spinal stenosis   . Lymphedema 11/2015   BOTH LEGS  . NASH (nonalcoholic steatohepatitis)   . Peripheral edema   . Peripheral edema 10/05/2015  . Type 2 diabetes mellitus ([place])     Past Surgical History:  Procedure Laterality Date  . BIOPSY N/A 07/21/2014   Procedure: BIOPSY;  Surgeon: Daneil Dolin, MD;  Location: [facility];  Service: Endoscopy;  Laterality: N/A;  . CATARACT EXTRACTION W/PHACO Right 07/08/2016   Procedure: CATARACT EXTRACTION PHACO AND INTRAOCULAR LENS PLACEMENT RIGHT EYE;  Surgeon: Tonny Branch, MD;  Location: [facility];  Service: Ophthalmology;  Laterality: Right;  CDE: 9.01  . CATARACT EXTRACTION W/PHACO Left 07/22/2016   Procedure: CATARACT EXTRACTION PHACO AND INTRAOCULAR LENS PLACEMENT LEFT EYE CDE=10.69;  Surgeon: Tonny Branch, MD;  Location: [facility];  Service: Ophthalmology;  Laterality: Left;  left -   . COLONOSCOPY  2011   [facility]: normal colon, normal distal ileum  . COLONOSCOPY WITH PROPOFOL N/A 07/21/2014   Procedure: ATTEMPTED COLONOSCOPY WITH PROPOFOL-HAD TO STOP DUE TO BRADYCARDIA;  Surgeon: Daneil Dolin, MD;  Location: [facility];  Service: Endoscopy;  Laterality: N/A;  . EGD with enteroscopy  2011   [facility]: normal esophagus and  stomach. Normal duodenum, jejunum. No evidence of AVMs.   . ESOPHAGOGASTRODUODENOSCOPY (EGD) WITH PROPOFOL N/A 07/21/2014   Procedure: ESOPHAGOGASTRODUODENOSCOPY (EGD) WITH PROPOFOL;  Surgeon: Daneil Dolin, MD;  Location: [facility];  Service: Endoscopy;  Laterality: N/A;  . HERNIA REPAIR    . KNEE ARTHROSCOPY WITH MEDIAL MENISECTOMY Left 11/06/2012   Procedure: KNEE ARTHROSCOPY WITH MEDIAL MENISECTOMY;  Surgeon: Carole Civil, MD;  Location: [facility];  Service: Orthopedics;  Laterality: Left;  . Lipoma removal     Stomach  . LUMBAR LAMINECTOMY/DECOMPRESSION MICRODISCECTOMY Left 08/30/2013   Procedure: LUMBAR LAMINECTOMY/DECOMPRESSION MICRODISCECTOMY LEFT  LUMBAR TWO THREE;  Surgeon: Otilio Connors, MD;  Location: [place] [facility]UR[facility];  Service: Neurosurgery;  Laterality: Left;  . RIGHT/LEFT HEART CATH AND CORONARY ANGIOGRAPHY N/A 09/22/2019   Procedure: RIGHT/LEFT HEART CATH AND CORONARY ANGIOGRAPHY;  Surgeon: Martinique, Peter M, MD;  Location: [place] CV [facility];  Service: Cardiovascular;  Laterality: N/A;  . SHOULDER SURGERY     Rght-rotator cuff  . TOOTH EXTRACTION       Current Outpatient Medications  Medication Sig Dispense Refill  . aspirin EC 81 MG tablet Take 81 mg by mouth daily.    . B-D ULTRAFINE III SHORT PEN 31G X 8 MM MISC USE AS DIRECTED DAILY WITH LANTUS. (Patient taking differently: See admin instructions. USE AS DIRECTED DAILY WITH LANTUS) 100 each 0  . Blood Glucose Monitoring Suppl (BLOOD GLUCOSE SYSTEM PAK) KIT Please dispense based on patient and insurance preference. Use as directed to monitor FSBS 2x daily. Dx: E11.9 1 each 1  . carvedilol (COREG) 6.25 MG tablet Take 1 tablet (6.25 mg total) by mouth 2 (two) times daily. 180 tablet 3  . diclofenac sodium (VOLTAREN) 1 % GEL Apply 4 g topically 4 (four) times daily as needed (pain). 100 g 3  . Ferrous Sulfate (IRON) 325 (65 FE) MG TABS Take 1 tablet by mouth daily.     . furosemide (LASIX) 40 MG tablet Take 1 tablet (40 mg total) by mouth daily. 30 tablet 1  . gabapentin (NEURONTIN) 400 MG capsule Take 1 capsule (400 mg total) by mouth 3 (three) times daily. 90 capsule 0  . Glucose Blood (BLOOD GLUCOSE TEST STRIPS) STRP Please dispense based on patient and insurance preference. Use as directed to monitor FSBS 2x daily. Dx: E11.9 100 each 1  . Insulin Glargine (LANTUS SOLOSTAR) 100 UNIT/ML Solostar Pen Inject 5 Units into the skin daily at 10 pm. 15 mL 0  . Lancets MISC Please dispense based on patient and insurance preference. Use as directed to monitor FSBS 2x daily. Dx: E11.9 100 each 1  . levothyroxine (SYNTHROID, LEVOTHROID) 100 MCG tablet TAKE 1 TABLET BY MOUTH DAILY BEFORE BREAKFAST. (Patient taking differently:  Take 100 mcg by mouth daily before breakfast. ) 30 tablet 0  . losartan (COZAAR) 25 MG tablet Take 1 tablet (25 mg total) by mouth daily. 30 tablet 2  . magnesium oxide (MAG-OX) 400 MG tablet Take 1 tablet (400 mg total) by mouth 2 (two) times daily. 60 tablet 11  . metFORMIN (GLUCOPHAGE) 1000 MG tablet TAKE ONE TABLET BY MOUTH TWICE DAILY WITH A MEAL. 180 tablet 3  . nystatin cream (MYCOSTATIN) APPLY TO AFFECTED AREA TWICE DAILY. 30 g 0  . oxyCODONE-acetaminophen (PERCOCET) 7.5-325 MG tablet TAKE 1 TABLET BY MOUTH EVERY 6 HOURS AS NEEDED FOR SEVERE PAIN. (Patient taking differently: Take 1 tablet by mouth every 6 (six) hours as needed for severe pain. ) 120 tablet 0  .  pantoprazole (PROTONIX) 40 MG tablet Take 1 tablet (40 mg total) by mouth daily. 30 tablet 11  . potassium chloride (K-DUR) 10 MEQ tablet Take 1 tablet (10 mEq total) by mouth 2 (two) times daily. 180 tablet 3  . pravastatin (PRAVACHOL) 40 MG tablet TAKE ONE TABLET BY MOUTH DAILY. (Patient taking differently: TAKE ONE TABLET BY MOUTH DAILY IN THE EVENING) 90 tablet 0  . traZODone (DESYREL) 100 MG tablet TAKE 1 TABLET BY MOUTH AT BEDTIME AS NEEDED FOR SLEEP. 90 tablet 2  . triamcinolone cream (KENALOG) 0.1 % Apply 1 application topically 2 (two) times daily. To arms/hand 30 g 0   No current facility-administered medications for this visit.    Allergies:   Patient has no known allergies.    Social History:  The patient  reports that he quit smoking about 20 years ago. His smoking use included cigarettes. He started smoking about 64 years ago. He has a 110.00 pack-year smoking history. He has never used smokeless tobacco. He reports that he does not drink alcohol or use drugs.   Family History:  The patient's family history includes Cancer in his father and mother; Depression in his mother; Diabetes in his brother, brother, father, mother, sister, and sister; Heart disease in his father, mother, sister, sister, and sister;  Hyperlipidemia in his father, mother, sister, sister, and sister; Hypertension in his father, mother, sister, sister, and sister; Rectal cancer in his mother.    ROS:  General:no colds or fevers, + weight gain Skin:no rashes or ulcers HEENT:no blurred vision, no congestion CV:see HPI PUL:see HPI GI:no diarrhea constipation or melena, no indigestion GU:no hematuria, no dysuria MS:no joint pain, no claudication Neuro:no syncope, no lightheadedness Endo:+ diabetes- doesn't check his glucose, + thyroid disease  Wt Readings from Last 3 Encounters:  09/27/19 193 lb (87.5 kg)  09/23/19 188 lb 0.8 oz (85.3 kg)  09/02/19 196 lb (88.9 kg)     PHYSICAL EXAM: VS:  BP 123/71   Pulse 90   Temp 97.9 F (36.6 C) (Temporal)   Ht 4' 11"  (1.499 m)   Wt 193 lb (87.5 kg)   SpO2 98%   BMI 38.98 kg/m  , BMI Body mass index is 38.98 kg/m. General:Pleasant affect, NAD Skin:Warm and dry, brisk capillary refill HEENT:normocephalic, sclera clear, mucus membranes moist Neck:supple, no JVD, no bruits  Heart:S1S2 RRR without murmur, gallup, rub or click Lungs:clear without rales, rhonchi, or wheezes ZOX:WRUE, non tender, + BS, do not palpate liver spleen or masses Ext:2+ lower ext edema,  2+ radial pulses Neuro:alert and oriented X 3, MAE, follows commands, + facial symmetry    EKG:  EKG is NOT ordered today.   Recent Labs: 08/03/2019: TSH 2.80 09/20/2019: ALT 21; B Natriuretic Peptide 233.0 09/22/2019: Magnesium 1.9 09/23/2019: BUN 12; Creatinine, Ser 1.16; Hemoglobin 12.7; Platelets 223; Potassium 3.9; Sodium 133    Lipid Panel    Component Value Date/Time   CHOL 133 08/03/2019 1010   TRIG 144 08/03/2019 1010   HDL 41 08/03/2019 1010   CHOLHDL 3.2 08/03/2019 1010   VLDL 22 04/10/2016 1019   LDLCALC 69 08/03/2019 1010       Other studies Reviewed: Additional studies/ records that were reviewed today include: . Echocardiogram 09/20/2019:  Impressions:  1. Left ventricular  ejection fraction, by visual estimation, is <20%. The left ventricle has severely decreased function. There is no left ventricular hypertrophy.  2. The left ventricle demonstrates regional wall motion abnormalities.  3. LVEF is approximately 15 to  20% with severe hypokinesis and akinesis if the distal 1/3 of left ventricle.. Only extreme proximal portions of LV thicken during systole. Compared to echo from 2017, LVEF is now severely depressed.  4. Global right ventricle has normal systolic function.The right ventricular size is normal. No increase in right ventricular wall thickness.  5. Left atrial size was normal.  6. Right atrial size was normal.  7. Mild mitral annular calcification.  8. The mitral valve is abnormal. Trivial mitral valve regurgitation.  9. The tricuspid valve is normal in structure. Tricuspid valve regurgitation is mild.  10. The aortic valve is abnormal. Aortic valve regurgitation is not visualized. Mild aortic valve sclerosis without stenosis.  11. The pulmonic valve was normal in structure. Pulmonic valve regurgitation is not visualized.  12. The inferior vena cava is dilated in size with <50% respiratory variability, suggesting right atrial pressure of 15 mmHg.  _______________   Right/Left Heart Catheterization 09/22/2019:   LV end diastolic pressure is normal.    1. Mild nonobstructive CAD  2. Normal LV filling pressures  3. Normal right heart pressures.  4. Normal cardiac output. Index 2.7.      Plan: will switch IV lasix to po. Optimize CHF therapy. Would anticipate DC in am if stable.    ASSESSMENT AND PLAN:  1.  NICM with EF <20% , cath with mild non obstructive disease.  On losartan and BB - change losartan to entresto after labs evaluated. And increase BB if BP allows and then repeat echo in 3 months, if EF still low would consider ICD. Follow up in 2 weeks.    2.  Chronic combined systolic and diastolic HF lungs clear with increasing lower  ext edema.  Will increase lasix to 40 BID and recheck BMP.  Prior to hospitalization was on bumex and metolazone.    3.  CAD but non obstructive.  On statin and ASA  4.  HLD on statin  5.  DM-2 per PCP on insulin  6.  HTN controlled on above meds.    Current medicines are reviewed with the patient today.  The patient Has no concerns regarding medicines.  The following changes have been made:  See above Labs/ tests ordered today include:see above  Disposition:   FU:  see above  Signed, Cecilie Kicks, NP  09/27/2019 2:51 PM    [facility] [address], [place], [place], [place] Phone: [phone]; Fax: [phone]

## 2019-09-27 ENCOUNTER — Other Ambulatory Visit (HOSPITAL_COMMUNITY)
Admission: RE | Admit: 2019-09-27 | Discharge: 2019-09-27 | Disposition: A | Discharge status: Home / Self Care | Payer: Medicare Other | Source: Ambulatory Visit | Attending: Cardiology | Admitting: Cardiology

## 2019-09-27 ENCOUNTER — Encounter: Payer: Self-pay | Admitting: Cardiology

## 2019-09-27 ENCOUNTER — Ambulatory Visit (INDEPENDENT_AMBULATORY_CARE_PROVIDER_SITE_OTHER): Payer: Medicare Other | Admitting: Cardiology

## 2019-09-27 ENCOUNTER — Other Ambulatory Visit: Payer: Self-pay

## 2019-09-27 VITALS — BP 123/71 | HR 90 | Temp 97.9°F | Ht 59.0 in | Wt 193.0 lb

## 2019-09-27 DIAGNOSIS — Z794 Long term (current) use of insulin: Secondary | ICD-10-CM

## 2019-09-27 DIAGNOSIS — I890 Lymphedema, not elsewhere classified: Secondary | ICD-10-CM

## 2019-09-27 DIAGNOSIS — I5042 Chronic combined systolic (congestive) and diastolic (congestive) heart failure: Secondary | ICD-10-CM

## 2019-09-27 DIAGNOSIS — E119 Type 2 diabetes mellitus without complications: Secondary | ICD-10-CM

## 2019-09-27 DIAGNOSIS — I428 Other cardiomyopathies: Secondary | ICD-10-CM | POA: Diagnosis not present

## 2019-09-27 DIAGNOSIS — I5032 Chronic diastolic (congestive) heart failure: Secondary | ICD-10-CM | POA: Insufficient documentation

## 2019-09-27 DIAGNOSIS — E782 Mixed hyperlipidemia: Secondary | ICD-10-CM | POA: Diagnosis not present

## 2019-09-27 DIAGNOSIS — I10 Essential (primary) hypertension: Secondary | ICD-10-CM

## 2019-09-27 LAB — BASIC METABOLIC PANEL
Anion gap: 12 (ref 5–15)
BUN: 46 mg/dL — ABNORMAL HIGH (ref 8–23)
CO2: 24 mmol/L (ref 22–32)
Calcium: 9.1 mg/dL (ref 8.9–10.3)
Chloride: 90 mmol/L — ABNORMAL LOW (ref 98–111)
Creatinine, Ser: 1.13 mg/dL (ref 0.61–1.24)
GFR calc Af Amer: >60 mL/min (ref >60)
GFR calc non Af Amer: >60 mL/min (ref >60)
Glucose, Bld: 229 mg/dL — ABNORMAL HIGH (ref 70–99)
Potassium: 5.2 mmol/L — ABNORMAL HIGH (ref 3.5–5.1)
Sodium: 126 mmol/L — ABNORMAL LOW (ref 135–145)

## 2019-09-27 NOTE — Patient Instructions (Signed)
Medication Instructions:  Your physician recommends that you continue on your current medications as directed. Please refer to the Current Medication list given to you today.   Labwork: TODAY  BMET  Testing/Procedures: NONE  Follow-Up: Your physician recommends that you schedule a follow-up appointment in: 2 WEEKS    Any Other Special Instructions Will Be Listed Below (If Applicable).     If you need a refill on your cardiac medications before your next appointment, please call your pharmacy.

## 2019-09-28 DIAGNOSIS — I5032 Chronic diastolic (congestive) heart failure: Secondary | ICD-10-CM | POA: Diagnosis not present

## 2019-09-28 DIAGNOSIS — I890 Lymphedema, not elsewhere classified: Secondary | ICD-10-CM | POA: Diagnosis not present

## 2019-09-28 DIAGNOSIS — R262 Difficulty in walking, not elsewhere classified: Secondary | ICD-10-CM | POA: Diagnosis not present

## 2019-09-28 DIAGNOSIS — R531 Weakness: Secondary | ICD-10-CM | POA: Diagnosis not present

## 2019-09-29 ENCOUNTER — Telehealth: Payer: Self-pay

## 2019-09-29 DIAGNOSIS — I5042 Chronic combined systolic (congestive) and diastolic (congestive) heart failure: Secondary | ICD-10-CM

## 2019-09-29 NOTE — Telephone Encounter (Signed)
Pt made aware. Will have labs done.

## 2019-09-29 NOTE — Telephone Encounter (Signed)
-----   Message from Isaiah Serge, NP sent at 09/28/2019  3:56 PM EST ----- Have pt stop his K dur and check his BMP on Thursday AM stat so we can see his K+ level and his Na level.

## 2019-10-04 ENCOUNTER — Ambulatory Visit: Payer: Medicare Other | Admitting: Physician Assistant

## 2019-10-05 ENCOUNTER — Other Ambulatory Visit (HOSPITAL_COMMUNITY)
Admission: RE | Admit: 2019-10-05 | Discharge: 2019-10-05 | Disposition: A | Discharge status: Home / Self Care | Payer: Medicare Other | Source: Ambulatory Visit | Attending: Cardiology | Admitting: Cardiology

## 2019-10-05 ENCOUNTER — Other Ambulatory Visit: Payer: Self-pay

## 2019-10-05 DIAGNOSIS — I5042 Chronic combined systolic (congestive) and diastolic (congestive) heart failure: Secondary | ICD-10-CM | POA: Diagnosis not present

## 2019-10-05 LAB — BASIC METABOLIC PANEL
Anion gap: 9 (ref 5–15)
BUN: 15 mg/dL (ref 8–23)
CO2: 27 mmol/L (ref 22–32)
Calcium: 8.9 mg/dL (ref 8.9–10.3)
Chloride: 96 mmol/L — ABNORMAL LOW (ref 98–111)
Creatinine, Ser: 0.8 mg/dL (ref 0.61–1.24)
GFR calc Af Amer: >60 mL/min (ref >60)
GFR calc non Af Amer: >60 mL/min (ref >60)
Glucose, Bld: 117 mg/dL — ABNORMAL HIGH (ref 70–99)
Potassium: 4.8 mmol/L (ref 3.5–5.1)
Sodium: 132 mmol/L — ABNORMAL LOW (ref 135–145)

## 2019-10-22 ENCOUNTER — Ambulatory Visit: Payer: Medicare Other | Admitting: Cardiology

## 2019-10-29 DIAGNOSIS — I5032 Chronic diastolic (congestive) heart failure: Secondary | ICD-10-CM | POA: Diagnosis not present

## 2019-10-29 DIAGNOSIS — R531 Weakness: Secondary | ICD-10-CM | POA: Diagnosis not present

## 2019-10-29 DIAGNOSIS — I890 Lymphedema, not elsewhere classified: Secondary | ICD-10-CM | POA: Diagnosis not present

## 2019-10-29 DIAGNOSIS — R262 Difficulty in walking, not elsewhere classified: Secondary | ICD-10-CM | POA: Diagnosis not present

## 2019-11-01 ENCOUNTER — Encounter: Payer: Self-pay | Admitting: Cardiology

## 2019-11-01 ENCOUNTER — Ambulatory Visit (INDEPENDENT_AMBULATORY_CARE_PROVIDER_SITE_OTHER): Payer: Medicare Other | Admitting: Cardiology

## 2019-11-01 ENCOUNTER — Other Ambulatory Visit: Payer: Self-pay

## 2019-11-01 VITALS — BP 169/85 | HR 96 | Temp 98.1°F | Ht 59.0 in | Wt 201.0 lb

## 2019-11-01 DIAGNOSIS — I5023 Acute on chronic systolic (congestive) heart failure: Secondary | ICD-10-CM

## 2019-11-01 DIAGNOSIS — I10 Essential (primary) hypertension: Secondary | ICD-10-CM

## 2019-11-01 MED ORDER — CARVEDILOL 12.5 MG PO TABS: 12.5000 mg | ORAL_TABLET | Freq: Two times a day (BID) | ORAL | 3 refills | Status: DC

## 2019-11-01 MED ORDER — LOSARTAN POTASSIUM 25 MG PO TABS: 25.0000 mg | ORAL_TABLET | Freq: Every day | ORAL | 2 refills | Status: DC

## 2019-11-01 MED ORDER — BUMETANIDE 2 MG PO TABS: 3.0000 mg | ORAL_TABLET | Freq: Two times a day (BID) | ORAL | 3 refills | Status: DC

## 2019-11-01 NOTE — Progress Notes (Signed)
Clinical Summary Mr. Pavon is a 74 y.o.male seen today for follow up of the following medical problems.   1. Chronic combined systolic/ diastolic heart failure - several year history of diastolic HF. New diagnosis of systolic HF during 70/9628 admission 08/2019 echo: <20%, akinesis distal 1/3 08/2019 cath mild CAD, normal filling pressures  -discharge weight 188 lbs. Normal filling pressures by cath at that time - weight today 201 lbs   2. Lymphedema - uses intermittent  machine for his lymphedema at home.   2. HTN - he is compliant with meds  3. PVCs - has been on coreg -= no recent symptoms    SH: veteran, he is a [place] vet.Served in the [employer]  Sister is Shirlean Mylar Courts also a patient of mine, recently passed [employer] labor day 2020 from pancreatic cancer   Past Medical History:  Diagnosis Date  . Arthritis   . Congestive heart disease ([place]) 11/2015  . COPD (chronic obstructive pulmonary disease) ([place])   . DDD (degenerative disc disease), lumbosacral   . Depression   . Diabetic neuropathy ([place])   . Diastolic dysfunction   . Essential hypertension   . GERD (gastroesophageal reflux disease)   . Hyperlipidemia   . Iron deficiency anemia   . Left knee DJD   . Lumbar spinal stenosis   . Lymphedema 11/2015   BOTH LEGS  . NASH (nonalcoholic steatohepatitis)   . Peripheral edema   . Peripheral edema 10/05/2015  . Type 2 diabetes mellitus (H[facility])      No Known Allergies   Current Outpatient Medications  Medication Sig Dispense Refill  . aspirin EC 81 MG tablet Take 81 mg by mouth daily.    . B-D ULTRAFINE III SHORT PEN 31G X 8 MM MISC USE AS DIRECTED DAILY WITH LANTUS. (Patient taking differently: See admin instructions. USE AS DIRECTED DAILY WITH LANTUS) 100 each 0  . Blood Glucose Monitoring Suppl (BLOOD GLUCOSE SYSTEM PAK) KIT Please dispense based on patient and insurance preference. Use as directed to monitor FSBS 2x daily. Dx: E11.9 1 each 1    . carvedilol (COREG) 6.25 MG tablet Take 1 tablet (6.25 mg total) by mouth 2 (two) times daily. 180 tablet 3  . diclofenac sodium (VOLTAREN) 1 % GEL Apply 4 g topically 4 (four) times daily as needed (pain). 100 g 3  . Ferrous Sulfate (IRON) 325 (65 FE) MG TABS Take 1 tablet by mouth daily.     . furosemide (LASIX) 40 MG tablet Take 1 tablet (40 mg total) by mouth daily. 30 tablet 1  . gabapentin (NEURONTIN) 400 MG capsule Take 1 capsule (400 mg total) by mouth 3 (three) times daily. 90 capsule 0  . Glucose Blood (BLOOD GLUCOSE TEST STRIPS) STRP Please dispense based on patient and insurance preference. Use as directed to monitor FSBS 2x daily. Dx: E11.9 100 each 1  . Insulin Glargine (LANTUS SOLOSTAR) 100 UNIT/ML Solostar Pen Inject 5 Units into the skin daily at 10 pm. 15 mL 0  . Lancets MISC Please dispense based on patient and insurance preference. Use as directed to monitor FSBS 2x daily. Dx: E11.9 100 each 1  . levothyroxine (SYNTHROID, LEVOTHROID) 100 MCG tablet TAKE 1 TABLET BY MOUTH DAILY BEFORE BREAKFAST. (Patient taking differently: Take 100 mcg by mouth daily before breakfast. ) 30 tablet 0  . losartan (COZAAR) 25 MG tablet Take 1 tablet (25 mg total) by mouth daily. 30 tablet 2  . magnesium oxide (MAG-OX) 400 MG tablet  Take 1 tablet (400 mg total) by mouth 2 (two) times daily. 60 tablet 11  . metFORMIN (GLUCOPHAGE) 1000 MG tablet TAKE ONE TABLET BY MOUTH TWICE DAILY WITH A MEAL. 180 tablet 3  . nystatin cream (MYCOSTATIN) APPLY TO AFFECTED AREA TWICE DAILY. 30 g 0  . oxyCODONE-acetaminophen (PERCOCET) 7.5-325 MG tablet TAKE 1 TABLET BY MOUTH EVERY 6 HOURS AS NEEDED FOR SEVERE PAIN. (Patient taking differently: Take 1 tablet by mouth every 6 (six) hours as needed for severe pain. ) 120 tablet 0  . pantoprazole (PROTONIX) 40 MG tablet Take 1 tablet (40 mg total) by mouth daily. 30 tablet 11  . potassium chloride (K-DUR) 10 MEQ tablet Take 1 tablet (10 mEq total) by mouth 2 (two) times  daily. 180 tablet 3  . pravastatin (PRAVACHOL) 40 MG tablet TAKE ONE TABLET BY MOUTH DAILY. (Patient taking differently: TAKE ONE TABLET BY MOUTH DAILY IN THE EVENING) 90 tablet 0  . traZODone (DESYREL) 100 MG tablet TAKE 1 TABLET BY MOUTH AT BEDTIME AS NEEDED FOR SLEEP. 90 tablet 2  . triamcinolone cream (KENALOG) 0.1 % Apply 1 application topically 2 (two) times daily. To arms/hand 30 g 0   No current facility-administered medications for this visit.     Past Surgical History:  Procedure Laterality Date  . BIOPSY N/A 07/21/2014   Procedure: BIOPSY;  Surgeon: Daneil Dolin, MD;  [facility];  Service: Endoscopy;  Laterality: N/A;  . CATARACT EXTRACTION W/PHACO Right 07/08/2016   Procedure: CATARACT EXTRACTION PHACO AND INTRAOCULAR LENS PLACEMENT RIGHT EYE;  Surgeon: Tonny , MD;  Location: [facility];  Service: Ophthalmology;  Laterality: Right;  CDE: 9.01  . CATARACT EXTRACTION W/PHACO Left 07/22/2016   Procedure: CATARACT EXTRACTION PHACO AND INTRAOCULAR LENS PLACEMENT LEFT EYE CDE=10.69;  Surgeon: Tonny , MD;  Location: [facility];  Service: Ophthalmology;  Laterality: Left;  left -   . COLONOSCOPY  2011   [facility]: normal colon, normal distal ileum  . COLONOSCOPY WITH PROPOFOL N/A 07/21/2014   Procedure: ATTEMPTED COLONOSCOPY WITH PROPOFOL-HAD TO STOP DUE TO BRADYCARDIA;  Surgeon: Daneil Dolin, MD;  Location: [facility];  Service: Endoscopy;  Laterality: N/A;  . EGD with enteroscopy  2011   [facility]: normal esophagus and stomach. Normal duodenum, jejunum. No evidence of AVMs.   . ESOPHAGOGASTRODUODENOSCOPY (EGD) WITH PROPOFOL N/A 07/21/2014   Procedure: ESOPHAGOGASTRODUODENOSCOPY (EGD) WITH PROPOFOL;  Surgeon: Daneil Dolin, MD;  Location: [facility];  Service: Endoscopy;  Laterality: N/A;  . HERNIA REPAIR    . KNEE ARTHROSCOPY WITH MEDIAL MENISECTOMY Left 11/06/2012   Procedure: KNEE ARTHROSCOPY WITH MEDIAL MENISECTOMY;  Surgeon: Carole Civil, MD;  Location: [facility];  Service:  Orthopedics;  Laterality: Left;  . Lipoma removal     Stomach  . LUMBAR LAMINECTOMY/DECOMPRESSION MICRODISCECTOMY Left 08/30/2013   Procedure: LUMBAR LAMINECTOMY/DECOMPRESSION MICRODISCECTOMY LEFT  LUMBAR TWO THREE;  Surgeon: Otilio Connors, MD;  Location: [place] NEUR[facility] ORS;  Service: Neurosurgery;  Laterality: Left;  . RIGHT/LEFT HEART CATH AND CORONARY ANGIOGRAPHY N/A 09/22/2019   Procedure: RIGHT/LEFT HEART CATH AND CORONARY ANGIOGRAPHY;  Surgeon: Martinique, Peter M, MD;  Location: [place] CV LAB;  Service: Cardiovascular;  Laterality: N/A;  . SHOULDER SURGERY     Rght-rotator cuff  . TOOTH EXTRACTION       No Known Allergies    Family History  Problem Relation Age of Onset  . Heart disease Mother   . Hyperlipidemia Mother   . Hypertension Mother   . Depression  Mother   . Diabetes Mother   . Rectal cancer Mother   . Cancer Mother   . Heart disease Father   . Hypertension Father   . Hyperlipidemia Father   . Diabetes Father   . Cancer Father   . Heart disease Sister   . Hyperlipidemia Sister   . Hypertension Sister   . Diabetes Sister   . Diabetes Brother   . Heart disease Sister   . Hyperlipidemia Sister   . Hypertension Sister   . Heart disease Sister   . Hyperlipidemia Sister   . Hypertension Sister   . Diabetes Sister   . Diabetes Brother      Social History Mr. Pascal reports that he quit smoking about 20 years ago. His smoking use included cigarettes. He started smoking about 64 years ago. He has a 110.00 pack-year smoking history. He has never used smokeless tobacco. Mr. Winokur reports no history of alcohol use.   Review of Systems CONSTITUTIONAL: No weight loss, fever, chills, weakness or fatigue.  HEENT: Eyes: No visual loss, blurred vision, double vision or yellow sclerae.No hearing loss, sneezing, congestion, runny nose or sore throat.  SKIN: No rash or itching.  CARDIOVASCULAR: per hpi RESPIRATORY: No shortness of breath, cough or sputum.    GASTROINTESTINAL: No anorexia, nausea, vomiting or diarrhea. No abdominal pain or blood.  GENITOURINARY: No burning on urination, no polyuria NEUROLOGICAL: No headache, dizziness, syncope, paralysis, ataxia, numbness or tingling in the extremities. No change in bowel or bladder control.  MUSCULOSKELETAL: No muscle, back pain, joint pain or stiffness.  LYMPHATICS: No enlarged nodes. No history of splenectomy.  PSYCHIATRIC: No history of depression or anxiety.  ENDOCRINOLOGIC: No reports of sweating, cold or heat intolerance. No polyuria or polydipsia.  Marland Kitchen   Physical Examination Today's Vitals   11/01/19 1003  BP: (!) 169/85  Pulse: 96  Temp: 98.1 F (36.7 C)  SpO2: 98%  Weight: 201 lb (91.2 kg)  Height: 4' 11"  (1.499 m)   Body mass index is 40.6 kg/m.  Gen: resting comfortably, no acute distress HEENT: no scleral icterus, pupils equal round and reactive, no palptable cervical adenopathy,  CV: RRR, no m/r/g, no jvd Resp: Clear to auscultation bilaterally GI: abdomen is soft, non-tender, non-distended, normal bowel sounds, no hepatosplenomegaly MSK: extremities are warm, 2+ bilateral LE edema Skin: warm, no rash Neuro:  no focal deficits Psych: appropriate affect   Diagnostic Studies  08/2019 echo IMPRESSIONS    1. Left ventricular ejection fraction, by visual estimation, is <20%. The  left ventricle has severely decreased function. There is no left  ventricular hypertrophy.  2. The left ventricle demonstrates regional wall motion abnormalities.  3. LVEF is approximately 15 to 20% with severe hypokinesis and akinesis  if the distal 1/3 of left ventricle.. Only extreme proximal portions of LV  thicken during systole. Compared to echo from 2017, LVEF is now severely  depressed.  4. Global right ventricle has normal systolic function.The right  ventricular size is normal. No increase in right ventricular wall  thickness.  5. Left atrial size was normal.  6. Right  atrial size was normal.  7. Mild mitral annular calcification.  8. The mitral valve is abnormal. Trivial mitral valve regurgitation.  9. The tricuspid valve is normal in structure. Tricuspid valve  regurgitation is mild.  10. The aortic valve is abnormal. Aortic valve regurgitation is not  visualized. Mild aortic valve sclerosis without stenosis.  11. The pulmonic valve was normal in structure. Pulmonic valve  regurgitation is not visualized.  12. The inferior vena cava is dilated in size with <50% respiratory  variability, suggesting right atrial pressure of 15 mmHg.    08/2019 cath  LV end diastolic pressure is normal.   1. Mild nonobstructive CAD 2. Normal LV filling pressures 3. Normal right heart pressures. 4. Normal cardiac output. Index 2.7.   Assessment and Plan  1.Acute on chronic systolic HF/ - at 891 lbs had normal filling pressures by cath. Weight up to 201 lbs today - stop lasix. Restart his prior bumex 78m bid. Hold metolazone for now. [place] later this week - increase coreg to 12.590mbid   2. HTN - above goal, increase coreg to 12.54m57mid   F/u 2 weeks virtual reassess weights and volume status    JonArnoldo Lenis.D.

## 2019-11-01 NOTE — Patient Instructions (Signed)
Medication Instructions:  Stop lasix   Increase coreg to 12.5 mg two times daily  Start BUMEX 3 MG -  TWO TIMES DAILY   Labwork: NONE  Testing/Procedures: NONE  Follow-Up: Your physician recommends that you schedule a follow-up appointment in: 2 WEEKS VIRTUALLY    Any Other Special Instructions Will Be Listed Below (If Applicable).  PLEASE CALL LATER IN WEEK TO UPDATE DR. BRANCH ON WEIGHTS    If you need a refill on your cardiac medications before your next appointment, please call your pharmacy.

## 2019-11-05 ENCOUNTER — Telehealth: Payer: Self-pay | Admitting: Cardiology

## 2019-11-05 DIAGNOSIS — G894 Chronic pain syndrome: Secondary | ICD-10-CM | POA: Diagnosis not present

## 2019-11-05 DIAGNOSIS — I890 Lymphedema, not elsewhere classified: Secondary | ICD-10-CM | POA: Diagnosis not present

## 2019-11-05 DIAGNOSIS — Z79899 Other long term (current) drug therapy: Secondary | ICD-10-CM | POA: Diagnosis not present

## 2019-11-05 NOTE — Telephone Encounter (Signed)
Pt notified and voiced understanding 

## 2019-11-05 NOTE — Telephone Encounter (Signed)
Weights moving in the right direction, continue bumex 37m bid. Have him update [place] mid next week. Teeth removal ok but would like to get the fluid off first   JZandra AbtsMD

## 2019-11-05 NOTE — Telephone Encounter (Signed)
Patient called to report his weight & sugar reports  2/2 197 lbs  Sugar 128  2/3 196  Lbs Sugar  126  2/4  196  Lbs Sugar   131  2/5 195  Lbs Sugar    134   Patient is wanting to know if he can have his teeth pulled also with heart conditions

## 2019-11-15 ENCOUNTER — Encounter: Payer: Self-pay | Admitting: *Deleted

## 2019-11-17 ENCOUNTER — Encounter: Payer: Self-pay | Admitting: Cardiology

## 2019-11-17 ENCOUNTER — Telehealth (INDEPENDENT_AMBULATORY_CARE_PROVIDER_SITE_OTHER): Payer: Medicare Other | Admitting: Cardiology

## 2019-11-17 VITALS — BP 164/75 | HR 86 | Ht 59.0 in | Wt 196.0 lb

## 2019-11-17 DIAGNOSIS — I5023 Acute on chronic systolic (congestive) heart failure: Secondary | ICD-10-CM

## 2019-11-17 DIAGNOSIS — I110 Hypertensive heart disease with heart failure: Secondary | ICD-10-CM

## 2019-11-17 DIAGNOSIS — I10 Essential (primary) hypertension: Secondary | ICD-10-CM

## 2019-11-17 MED ORDER — CARVEDILOL 25 MG PO TABS: 25.0000 mg | ORAL_TABLET | Freq: Two times a day (BID) | ORAL | 3 refills | Status: DC

## 2019-11-17 MED ORDER — METOLAZONE 2.5 MG PO TABS: ORAL_TABLET | ORAL | 3 refills | Status: DC

## 2019-11-17 NOTE — Patient Instructions (Signed)
Medication Instructions:  Take metolazone 2.5 mg- one time weekly  Increase coreg to 25 mg - two times daily   Labwork: 2 weeks  bmet magnesium   Testing/Procedures: none  Follow-Up: Your physician recommends that you schedule a follow-up appointment in: 1 month    Any Other Special Instructions Will Be Listed Below (If Applicable).     If you need a refill on your cardiac medications before your next appointment, please call your pharmacy.

## 2019-11-17 NOTE — Addendum Note (Signed)
Addended byDebbora Lacrosse R on: 11/17/2019 10:01 AM   Modules accepted: Orders

## 2019-11-17 NOTE — Progress Notes (Signed)
Virtual Visit via Telephone Note   This visit type was conducted due to national recommendations for restrictions regarding the COVID-19 Pandemic (e.g. social distancing) in an effort to limit this patient's exposure and mitigate transmission in our community.  Due to his co-morbid illnesses, this patient is at least at moderate risk for complications without adequate follow up.  This format is felt to be most appropriate for this patient at this time.  The patient did not have access to video technology/had technical difficulties with video requiring transitioning to audio format only (telephone).  All issues noted in this document were discussed and addressed.  No physical exam could be performed with this format.  Please refer to the patient's chart for his  consent to telehealth for [facility].   Date:  11/17/2019   ID:  Michael [name], DOB 1946/05/22, MRN [id number]  Patient Location: Home Provider Location: Office  PCP:  Michael Rossetti, MD  Cardiologist:  Michael Dolly, MD  Electrophysiologist:  None   Evaluation Performed:  Follow-Up Visit  Chief Complaint:  Follow up   History of Present Illness:    Michael [name] is a 74 y.o. male seen today for follow up of the following medical problems. This is a focused visit for his history of chronic systolic HF.   1. Chronic combined systolic/ diastolic heart failure - several year history of diastolic HF. New diagnosis of systolic HF during 30/0923 admission 08/2019 echo: <20%, akinesis distal 1/3 08/2019 cath mild CAD, normal filling pressures  -discharge weight 188 lbs. Normal filling pressures by cath at that time - weight last clinic visit 201 lbs   - last visit we changed him back to bumex 49m bid.  - phone call 11/05/19 that weights had trended down to 195 lbs.  - last visit we increased coreg to 12.593mbid.  - no recent symptoms     SH: [place], [place] is [place]Served in the [employer]ister is Michael MylarCourts also a patient of mine, recently passed [employer] 2020 from pancreatic cancer  Getting his covid shot tomorrow at [employer]  The patient does not have symptoms concerning for COVID-19 infection (fever, chills, cough, or new shortness of breath).    Past Medical History:  Diagnosis Date  . Arthritis   . Congestive heart disease ([place]  . COPD (ch[place] disease) ([place]  . DDD (degenerative disc disease), lumbosacral   . Depression   . Diabetic neuropathy ([place]  . Diastolic dysfunction   . Essential hypertension   . GERD (gastroesophageal reflux disease)   . Hyperlipidemia   . Iron deficiency anemia   . Left knee DJD   . Lumbar spinal stenosis   . Lymphedema 11/2015   BOTH LEGS  . NASH (nonalcoholic steatohepatitis)   . Peripheral edema   . Peripheral edema 10/05/2015  . Type 2 diabetes mellitus ([place]   Past Surgical History:  Procedure Laterality Date  . BIOPSY N/A 07/21/2014   Procedure: BIOPSY;  Surgeon: RoDaneil DolinMD;  Location: [facility];  Service: Endoscopy;  Laterality: N/A;  . CATARACT EXTRACTION W/PHACO Right 07/08/2016   Procedure: CATARACT EXTRACTION PHACO AND INTRAOCULAR LENS PLACEMENT RIGHT EYE;  Surgeon: KeTonny BranchMD;  Location: [facility];  Service: Ophthalmology;  Laterality: Right;  CDE: 9.01  . CATARACT EXTRACTION W/PHACO Left 07/22/2016   Procedure: CATARACT EXTRACTION PHACO AND INTRAOCULAR LENS PLACEMENT LEFT EYE CDE=10.69;  Surgeon: KeTonny BranchMD;  Location: [facility];  Service: Ophthalmology;  Laterality: Left;  left -   . COLONOSCOPY  2011   [facility]: normal colon, normal distal ileum  . COLONOSCOPY WITH PROPOFOL N/A 07/21/2014   Procedure: ATTEMPTED COLONOSCOPY WITH PROPOFOL-HAD TO STOP DUE TO BRADYCARDIA;  Surgeon: Michael Dolin, MD;  Location: [facility];  Service: Endoscopy;  Laterality: N/A;  . EGD with enteroscopy  2011   [facility]: normal esophagus and stomach. Normal duodenum, jejunum. No  evidence of AVMs.   . ESOPHAGOGASTRODUODENOSCOPY (EGD) WITH PROPOFOL N/A 07/21/2014   Procedure: ESOPHAGOGASTRODUODENOSCOPY (EGD) WITH PROPOFOL;  Surgeon: Michael Dolin, MD;  Location: [facility];  Service: Endoscopy;  Laterality: N/A;  . HERNIA REPAIR    . KNEE ARTHROSCOPY WITH MEDIAL MENISECTOMY Left 11/06/2012   Procedure: KNEE ARTHROSCOPY WITH MEDIAL MENISECTOMY;  Surgeon: Michael Civil, MD;  Location: [facility];  Service: Orthopedics;  Laterality: Left;  . Lipoma removal     Stomach  . LUMBAR LAMINECTOMY/DECOMPRESSION MICRODISCECTOMY Left 08/30/2013   Procedure: LUMBAR LAMINECTOMY/DECOMPRESSION MICRODISCECTOMY LEFT  LUMBAR TWO THREE;  Surgeon: Michael Connors, MD;  Location: [place] NE[facility] ORS;  Service: Neurosurgery;  Laterality: Left;  . RIGHT/LEFT HEART CATH AND CORONARY ANGIOGRAPHY N/A 09/22/2019   Procedure: RIGHT/LEFT HEART CATH AND CORONARY ANGIOGRAPHY;  Surgeon: [name], Michael M, MD;  Location: [place] CV LAB;  Service: Cardiovascular;  Laterality: N/A;  . SHOULDER SURGERY     Rght-rotator cuff  . TOOTH EXTRACTION       No outpatient medications have been marked as taking for the 11/17/19 encounter (Appointment) with Michael Lenis, MD.     Allergies:   Patient has no known allergies.   Social History   Tobacco Use  . Smoking status: Former Smoker    Packs/day: 2.50    Years: 44.00    Pack years: 110.00    Types: Cigarettes    Start date: 05/07/1955    Quit date: 10/01/1999    Years since quitting: 20.1  . Smokeless tobacco: Never Used  Substance Use Topics  . Alcohol use: No    Alcohol/week: 0.0 standard drinks  . Drug use: No     Family Hx: The patient's family history includes Cancer in his father and mother; Depression in his mother; Diabetes in his brother, brother, father, mother, sister, and sister; Heart disease in his father, mother, sister, sister, and sister; Hyperlipidemia in his father, mother, sister, sister, and sister; Hypertension in his father, mother,  sister, sister, and sister; Rectal cancer in his mother.  ROS:   Please see the history of present illness.    All other systems reviewed and are negative.   Prior CV studies:   The following studies were reviewed today:  08/2019 echo IMPRESSIONS    1. Left ventricular ejection fraction, by visual estimation, is <20%. The  left ventricle has severely decreased function. There is no left  ventricular hypertrophy.  2. The left ventricle demonstrates regional wall motion abnormalities.  3. LVEF is approximately 15 to 20% with severe hypokinesis and akinesis  if the distal 1/3 of left ventricle.. Only extreme proximal portions of LV  thicken during systole. Compared to echo from 2017, LVEF is now severely  depressed.  4. Global right ventricle has normal systolic function.The right  ventricular size is normal. No increase in right ventricular wall  thickness.  5. Left atrial size was normal.  6. Right atrial size was normal.  7. Mild mitral annular calcification.  8. The mitral valve is  abnormal. Trivial mitral valve regurgitation.  9. The tricuspid valve is normal in structure. Tricuspid valve  regurgitation is mild.  10. The aortic valve is abnormal. Aortic valve regurgitation is not  visualized. Mild aortic valve sclerosis without stenosis.  11. The pulmonic valve was normal in structure. Pulmonic valve  regurgitation is not visualized.  12. The inferior vena cava is dilated in size with <50% respiratory  variability, suggesting right atrial pressure of 15 mmHg.    08/2019 cath  LV end diastolic pressure is normal.  1. Mild nonobstructive CAD 2. Normal LV filling pressures 3. Normal right heart pressures. 4. Normal cardiac output. Index 2.7.  Labs/Other Tests and Data Reviewed:    EKG:  n/a  Recent Labs: 08/03/2019: TSH 2.80 09/20/2019: ALT 21; B Natriuretic Peptide 233.0 09/22/2019: Magnesium 1.9 09/23/2019: Hemoglobin 12.7; Platelets 223 10/05/2019:  BUN 15; Creatinine, Ser 0.80; Potassium 4.8; Sodium 132   Recent Lipid Panel Lab Results  Component Value Date/Time   CHOL 133 08/03/2019 10:10 AM   TRIG 144 08/03/2019 10:10 AM   HDL 41 08/03/2019 10:10 AM   CHOLHDL 3.2 08/03/2019 10:10 AM   LDLCALC 69 08/03/2019 10:10 AM    Wt Readings from Last 3 Encounters:  11/01/19 201 lb (91.2 kg)  09/27/19 193 lb (87.5 kg)  09/23/19 188 lb 0.8 oz (85.3 kg)     Objective:    Vital Signs:   Today's Vitals   11/17/19 0815  BP: (!) 164/75  Pulse: 86  Weight: 196 lb (88.9 kg)  Height: 4' 11"  (1.499 m)   Body mass index is 39.59 kg/m. Normal affect. Normal speech pattern and tone. Comfortable, no apparent distress. No audible signs of SOB or wheezing.   ASSESSMENT & PLAN:    1.Acute on chronic systolic HF/ -weights trending down, not back to his dry weight of 188 lbs yet.  - continue bumex 33m bid, add metolazone 2.58monce a week - increase coreg to 2510mid - BMET/Mg in 2 weeks.    2. HTN -above goal, increase coreg to 44m47md.      COVID-19 Education: The signs and symptoms of COVID-19 were discussed with the patient and how to seek care for testing (follow up with PCP or arrange E-visit).  The importance of social distancing was discussed today.  Time:   Today, I have spent 22 minutes with the patient with telehealth technology discussing the above problems.     Medication Adjustments/Labs and Tests Ordered: Current medicines are reviewed at length with the patient today.  Concerns regarding medicines are outlined above.   Tests Ordered: No orders of the defined types were placed in this encounter.   Medication Changes: No orders of the defined types were placed in this encounter.   Follow Up:  In Person in 1 month(s)  Signed, BranCarlyle [name]  11/17/2019 7:43 AM    [place]

## 2019-11-28 DIAGNOSIS — I890 Lymphedema, not elsewhere classified: Secondary | ICD-10-CM | POA: Diagnosis not present

## 2019-11-28 DIAGNOSIS — R531 Weakness: Secondary | ICD-10-CM | POA: Diagnosis not present

## 2019-11-28 DIAGNOSIS — R262 Difficulty in walking, not elsewhere classified: Secondary | ICD-10-CM | POA: Diagnosis not present

## 2019-11-28 DIAGNOSIS — I5032 Chronic diastolic (congestive) heart failure: Secondary | ICD-10-CM | POA: Diagnosis not present

## 2019-12-07 ENCOUNTER — Ambulatory Visit (INDEPENDENT_AMBULATORY_CARE_PROVIDER_SITE_OTHER): Payer: Medicare Other | Admitting: Family Medicine

## 2019-12-07 ENCOUNTER — Encounter: Payer: Self-pay | Admitting: Family Medicine

## 2019-12-07 ENCOUNTER — Other Ambulatory Visit: Payer: Self-pay

## 2019-12-07 VITALS — BP 144/80 | HR 82 | Temp 98.1°F | Resp 14 | Wt 200.4 lb

## 2019-12-07 DIAGNOSIS — E038 Other specified hypothyroidism: Secondary | ICD-10-CM

## 2019-12-07 DIAGNOSIS — R531 Weakness: Secondary | ICD-10-CM

## 2019-12-07 DIAGNOSIS — I5042 Chronic combined systolic (congestive) and diastolic (congestive) heart failure: Secondary | ICD-10-CM

## 2019-12-07 DIAGNOSIS — L8991 Pressure ulcer of unspecified site, stage 1: Secondary | ICD-10-CM | POA: Insufficient documentation

## 2019-12-07 DIAGNOSIS — I10 Essential (primary) hypertension: Secondary | ICD-10-CM

## 2019-12-07 DIAGNOSIS — M48061 Spinal stenosis, lumbar region without neurogenic claudication: Secondary | ICD-10-CM

## 2019-12-07 DIAGNOSIS — G8929 Other chronic pain: Secondary | ICD-10-CM

## 2019-12-07 DIAGNOSIS — G622 Polyneuropathy due to other toxic agents: Secondary | ICD-10-CM

## 2019-12-07 DIAGNOSIS — L309 Dermatitis, unspecified: Secondary | ICD-10-CM

## 2019-12-07 DIAGNOSIS — E1149 Type 2 diabetes mellitus with other diabetic neurological complication: Secondary | ICD-10-CM | POA: Diagnosis not present

## 2019-12-07 DIAGNOSIS — I890 Lymphedema, not elsewhere classified: Secondary | ICD-10-CM

## 2019-12-07 DIAGNOSIS — M25562 Pain in left knee: Secondary | ICD-10-CM

## 2019-12-07 DIAGNOSIS — E782 Mixed hyperlipidemia: Secondary | ICD-10-CM

## 2019-12-07 DIAGNOSIS — L89151 Pressure ulcer of sacral region, stage 1: Secondary | ICD-10-CM

## 2019-12-07 DIAGNOSIS — M1611 Unilateral primary osteoarthritis, right hip: Secondary | ICD-10-CM

## 2019-12-07 DIAGNOSIS — M25561 Pain in right knee: Secondary | ICD-10-CM

## 2019-12-07 DIAGNOSIS — R269 Unspecified abnormalities of gait and mobility: Secondary | ICD-10-CM

## 2019-12-07 MED ORDER — TRIAMCINOLONE ACETONIDE 0.1 % EX CREA: 1.0000 "application " | TOPICAL_CREAM | Freq: Two times a day (BID) | CUTANEOUS | 2 refills | Status: DC

## 2019-12-07 NOTE — Assessment & Plan Note (Signed)
Chronic knee, hip, back pain Will try PT, to help strength Upper body to help with ambulation, he would benefit from being able to walk even short distances with walker Will also relief pressure from sitting all day, leading to pressure sores

## 2019-12-07 NOTE — Assessment & Plan Note (Signed)
Has been well controlled, goal is A1C less than 7%

## 2019-12-07 NOTE — Assessment & Plan Note (Signed)
Pt needs new mattress for hospital bed, orders written

## 2019-12-07 NOTE — Assessment & Plan Note (Signed)
BP is mildly elevated today He will take diuretic once he gets home Check metabolic panel and mag level

## 2019-12-07 NOTE — Assessment & Plan Note (Signed)
Continue barrier cream, movement of different positions will also help

## 2019-12-07 NOTE — Patient Instructions (Signed)
F/u 4 months

## 2019-12-07 NOTE — Assessment & Plan Note (Signed)
Refilled TAC cream ,continue moisturizer

## 2019-12-07 NOTE — Progress Notes (Signed)
Subjective:    Patient ID: Michael [name], male    DOB: October 16, 1945, 74 y.o.   MRN: [id number]  Patient presents for Follow-up (is fasting), Pressure Ulcer (midline on buttocks), and Rash (skin irritation to hands, legs, abd)  Patient here to follow-up chronic medical problems.  He follows with cardiology as well as the [facility] [employer].  He was admitted to the hospital back in December 2020 secondary to pulmonary edema and hypoxia.  He had catheterization performed.  He was diagnosed with combined systolic and diastolic heart failure with mild coronary artery disease noted on cath.  He is currently on Bumex 3 mg twice a day carvedilol 12.5 mg twice a day.  metolazone  2.50m once a week.  He is on statin drug Pravastatn   Mild depression/difficulty sleeping continue trazodone 1052m  Chronic pain syndrome- continue pain medication per pain clinic, he is having more troubles with his right knee and hip he has not had any falls.  He is unable to bear weight but but only a couple seconds.  He was walking with his walker but now is pretty much wheelchair-bound.  He would like to try physical therapy to see if he can get him of the movement again.  She has had injections performed in the joints with minimal improvement and short-lived.  He is not a surgical candidate. Chronic lymphedema- compression hose, wraps,   DM type 2 with neuropathy, last A1C 6.8% in November . Taking  lantus at 15 units  and metformin 100047mID, no hypoglycemia episodes   CBG range- 125-135, but had  Elevated fasting 201, 2 days ago   - I will obtain the last eye visit   Break out on hands  And one spot on lower belly itching 2 months he does have underlying eczema.  He had triamcinolone but ran out of this.  Is also been using antiitch lotion as he has dry skin. Mostly itching and dry patches  History of sacral Decub- he unfortunately is unable to get up and move around.  He sits in his wheelchair or recliner  all day long.  He noticed pressure sores coming back on his bottom.  He has had this treated multiple times even with home health nurse.  He has been using Desitin and other barrier cream and states it is already improving I will like for me to look at it.  Medications reviewed.  Recent labs reviewed Review Of Systems:  GEN- denies fatigue, fever, weight loss,weakness, recent illness HEENT- denies eye drainage, change in vision, nasal discharge, CVS- denies chest pain, palpitations RESP- denies SOB, cough, wheeze ABD- denies N/V, change in stools, abd pain GU- denies dysuria, hematuria, dribbling, incontinence MSK- denies joint pain, muscle aches, injury Neuro- denies headache, dizziness, syncope, seizure activity       Objective:    BP (!) 144/80 (BP Location: Left Arm, Patient Position: Sitting, Cuff Size: Large)   Pulse 82   Temp 98.1 F (36.7 C) (Temporal)   Resp 14   Wt 200 lb 6.4 oz (90.9 kg)   SpO2 97%   BMI 40.48 kg/m  GEN- NAD, alert and oriented x3 HEENT- PERRL, EOMI, non injected sclera, pink conjunctiva, MMM, oropharynx clear Neck- Supple, no thyromegaly CVS- RRR, no murmur RESP-CTAB ABD-NABS,soft,NT,ND EXT- chronic lymphedema   MSK- Upon standing is hunched over  , decreased ROM bilat knees and hips, pain with flexion and extension of knees, pain rom ROM of hip  Skin dry skin bilateral  hands no specific rash small patchy eczematous rash on the right lower abdomen minimal erythema no blisters or vesicles Mild erythema sacral region no ulceration noted Pulses- Radial, DP- 2+        Assessment & Plan:      Problem List Items Addressed This Visit      Unprioritized   Chronic combined systolic and diastolic heart failure ([place])    We did discuss some of his food, eating lots of soups, macaroni salad, prepared meals with sodium       Dermatitis    Refilled TAC cream ,continue moisturizer      Essential hypertension - Primary    BP is mildly elevated  today He will take diuretic once he gets home Check metabolic panel and mag level      Relevant Orders   Basic metabolic panel   Hemoglobin A1c   CBC with Differential/Platelet   Magnesium   Generalized weakness    Pt needs new mattress for hospital bed, orders written       Relevant Orders   Ambulatory referral to [facility]   Hyperlipidemia    Continue statin drug        Hypothyroidism   Knee pain, chronic    Chronic knee, hip, back pain Will try PT, to help strength Upper body to help with ambulation, he would benefit from being able to walk even short distances with walker Will also relief pressure from sitting all day, leading to pressure sores        Relevant Orders   Ambulatory referral to [place]   Lymphedema   Peripheral neuropathy   Pressure injury of skin, stage 1    Continue barrier cream, movement of different positions will also help      Spinal stenosis of lumbar region   Relevant Orders   Ambulatory referral to Home Health   Type II diabetes mellitus with neurological manifestations ([place])    Has been well controlled, goal is A1C less than 7%       Relevant Orders   Lipid panel   CBC with Differential/Platelet    Other Visit Diagnoses    Primary osteoarthritis of right hip       Relevant Orders   Ambulatory referral to Home Health   Gait disorder       Relevant Orders   Ambulatory referral to Home Health      Note: This dictation was prepared with Dragon dictation along with smaller phrase technology. Any transcriptional errors that result from this process are unintentional.

## 2019-12-07 NOTE — Assessment & Plan Note (Signed)
Continue statin drug

## 2019-12-07 NOTE — Assessment & Plan Note (Signed)
We did discuss some of his food, eating lots of soups, macaroni salad, prepared meals with sodium

## 2019-12-08 LAB — CBC WITH DIFFERENTIAL/PLATELET
Absolute Monocytes: 432 cells/uL (ref 200–950)
Basophils Absolute: 99 cells/uL (ref 0–200)
Basophils Relative: 2.2 %
Eosinophils Absolute: 153 cells/uL (ref 15–500)
Eosinophils Relative: 3.4 %
HCT: 36.4 % — ABNORMAL LOW (ref 38.5–50.0)
Hemoglobin: 12 g/dL — ABNORMAL LOW (ref 13.2–17.1)
Lymphs Abs: 1382 cells/uL (ref 850–3900)
MCH: 27.6 pg (ref 27.0–33.0)
MCHC: 33 g/dL (ref 32.0–36.0)
MCV: 83.9 fL (ref 80.0–100.0)
MPV: 10.7 fL (ref 7.5–12.5)
Monocytes Relative: 9.6 %
Neutro Abs: 2435 cells/uL (ref 1500–7800)
Neutrophils Relative %: 54.1 %
Platelets: 186 10*3/uL (ref 140–400)
RBC: 4.34 10*6/uL (ref 4.20–5.80)
RDW: 14.5 % (ref 11.0–15.0)
Total Lymphocyte: 30.7 %
WBC: 4.5 10*3/uL (ref 3.8–10.8)

## 2019-12-08 LAB — BASIC METABOLIC PANEL
BUN: 18 mg/dL (ref 7–25)
CO2: 30 mmol/L (ref 20–32)
Calcium: 9.6 mg/dL (ref 8.6–10.3)
Chloride: 93 mmol/L — ABNORMAL LOW (ref 98–110)
Creat: 1.09 mg/dL (ref 0.70–1.18)
Glucose, Bld: 152 mg/dL — ABNORMAL HIGH (ref 65–99)
Potassium: 4.5 mmol/L (ref 3.5–5.3)
Sodium: 135 mmol/L (ref 135–146)

## 2019-12-08 LAB — HEMOGLOBIN A1C
Hgb A1c MFr Bld: 8 % of total Hgb — ABNORMAL HIGH (ref <5.7)
Mean Plasma Glucose: 183 (calc)
eAG (mmol/L): 10.1 (calc)

## 2019-12-08 LAB — LIPID PANEL
Cholesterol: 170 mg/dL (ref <200)
HDL: 36 mg/dL — ABNORMAL LOW (ref >=40)
LDL Cholesterol (Calc): 86 mg/dL (calc)
Non-HDL Cholesterol (Calc): 134 mg/dL (calc) — ABNORMAL HIGH (ref <130)
Total CHOL/HDL Ratio: 4.7 (calc) (ref <5.0)
Triglycerides: 362 mg/dL — ABNORMAL HIGH (ref <150)

## 2019-12-08 LAB — MAGNESIUM: Magnesium: 1.4 mg/dL — ABNORMAL LOW (ref 1.5–2.5)

## 2019-12-10 ENCOUNTER — Encounter: Payer: Self-pay | Admitting: *Deleted

## 2019-12-14 ENCOUNTER — Encounter: Payer: Self-pay | Admitting: Cardiology

## 2019-12-14 ENCOUNTER — Ambulatory Visit (INDEPENDENT_AMBULATORY_CARE_PROVIDER_SITE_OTHER): Payer: Medicare Other | Admitting: Cardiology

## 2019-12-14 VITALS — BP 138/64 | HR 76 | Temp 97.8°F | Ht 59.0 in | Wt 199.0 lb

## 2019-12-14 DIAGNOSIS — I5023 Acute on chronic systolic (congestive) heart failure: Secondary | ICD-10-CM | POA: Diagnosis not present

## 2019-12-14 MED ORDER — CARVEDILOL 12.5 MG PO TABS: 12.5000 mg | ORAL_TABLET | Freq: Two times a day (BID) | ORAL | 3 refills | Status: DC

## 2019-12-14 MED ORDER — METOLAZONE 2.5 MG PO TABS: ORAL_TABLET | ORAL | 3 refills | Status: DC

## 2019-12-14 MED ORDER — ENTRESTO 24-26 MG PO TABS: 1.0000 | ORAL_TABLET | Freq: Two times a day (BID) | ORAL | 6 refills | Status: DC

## 2019-12-14 NOTE — Patient Instructions (Addendum)
Medication Instructions:  STOP LOSARTAN   DECREASE COREG TO 12.5 MG TWO TIMES DAILY  START ENTRESTO 24/26 MG TWO TIMES DAILY  TAKE METOLAZONE 2.5 MG - TWO TIMES WEEKLY   Labwork: 2 WEEKS  BMET MAGNESIUM   Testing/Procedures: NONE   Follow-Up: Your physician recommends that you schedule a follow-up appointment in: 1 MONTH    Any Other Special Instructions Will Be Listed Below (If Applicable).     If you need a refill on your cardiac medications before your next appointment, please call your pharmacy.

## 2019-12-14 NOTE — Progress Notes (Signed)
Clinical Summary Michael [name] is a 74 y.o.male  seen today for follow up of the following medical problems. This is a focused visit for his history of chronic systolic HF.   1. Chroniccombined systolic/diastolic heart failure - several year history of diastolic HF. New diagnosis of systolic HF during 83/6629 admission 08/2019 echo: <20%, akinesis distal 1/3 08/2019 cath mild CAD, normal filling pressures  -discharge weight 188 lbs. Normal filling pressuresby cath at that time - weight last clinic visit 201 lbs    last visit he was to continue bumex 37m bid, added metolazone 2.54monce a week - increased coreg to 2552mid last visit. Some increased fatigue - weight today 199 lbs, was 200 lbs - swelling is stable. Home weights 195-200 lbs.    SH: veteran, he is a [place]Served in the [employer] is Jay[employer]irlean Mylarurts also a patient of mine, recently passed [place] [employer] day 2020 from pancreatic cancer  Getting his 2nd covid shot tomorrow at [employer] [employer]   Past Medical History:  Diagnosis Date  . Arthritis   . Congestive heart disease ([place]  . COPD (chronic obstructive pulmonary disease) ([facility]CC[place] . DDD (degenerative disc disease), lumbosacral   . Depression   . Diabetic neuropathy ([facility]CC[place] . Diastolic dysfunction   . Essential hypertension   . GERD (gastroesophageal reflux disease)   . Hyperlipidemia   . Iron deficiency anemia   . Left knee DJD   . Lumbar spinal stenosis   . Lymphedema 11/2015   BOTH LEGS  . NASH (nonalcoholic steatohepatitis)   . Peripheral edema   . Peripheral edema 10/05/2015  . Type 2 diabetes mellitus (HCC)      No Known Allergies   Current Outpatient Medications  Medication Sig Dispense Refill  . aspirin EC 81 MG tablet Take 81 mg by mouth daily.    . B-D ULTRAFINE III SHORT PEN 31G X 8 MM MISC USE AS DIRECTED DAILY WITH LANTUS. (Patient taking differently: See admin instructions. USE AS DIRECTED DAILY  WITH LANTUS) 100 each 0  . Blood Glucose Monitoring Suppl (BLOOD GLUCOSE SYSTEM PAK) KIT Please dispense based on patient and insurance preference. Use as directed to monitor FSBS 2x daily. Dx: E11.9 1 each 1  . bumetanide (BUMEX) 2 MG tablet Take 1.5 tablets (3 mg total) by mouth 2 (two) times daily. 270 tablet 3  . carvedilol (COREG) 25 MG tablet Take 1 tablet (25 mg total) by mouth 2 (two) times daily. 180 tablet 3  . diclofenac sodium (VOLTAREN) 1 % GEL Apply 4 g topically 4 (four) times daily as needed (pain). 100 g 3  . Ferrous Sulfate (IRON) 325 (65 FE) MG TABS Take 1 tablet by mouth daily.     . gMarland Kitchenbapentin (NEURONTIN) 400 MG capsule Take 1 capsule (400 mg total) by mouth 3 (three) times daily. 90 capsule 0  . Glucose Blood (BLOOD GLUCOSE TEST STRIPS) STRP Please dispense based on patient and insurance preference. Use as directed to monitor FSBS 2x daily. Dx: E11.9 100 each 1  . Insulin Glargine (LANTUS SOLOSTAR) 100 UNIT/ML Solostar Pen Inject 5 Units into the skin daily at 10 pm. 15 mL 0  . Lancets MISC Please dispense based on patient and insurance preference. Use as directed to monitor FSBS 2x daily. Dx: E11.9 100 each 1  . levothyroxine (SYNTHROID, LEVOTHROID) 100 MCG tablet TAKE 1 TABLET BY MOUTH DAILY BEFORE BREAKFAST. (Patient taking differently: Take 100 mcg by  mouth daily before breakfast. ) 30 tablet 0  . losartan (COZAAR) 25 MG tablet Take 1 tablet (25 mg total) by mouth daily. 90 tablet 2  . magnesium oxide (MAG-OX) 400 MG tablet Take 1 tablet (400 mg total) by mouth 2 (two) times daily. 60 tablet 11  . metFORMIN (GLUCOPHAGE) 1000 MG tablet TAKE ONE TABLET BY MOUTH TWICE DAILY WITH A MEAL. 180 tablet 3  . metolazone (ZAROXOLYN) 2.5 MG tablet Take 2.5 mg (1 tablet) once weekly. 12 tablet 3  . nystatin cream (MYCOSTATIN) APPLY TO AFFECTED AREA TWICE DAILY. 30 g 0  . omeprazole (PRILOSEC) 40 MG capsule Take 40 mg by mouth daily.    Marland Kitchen oxyCODONE-acetaminophen (PERCOCET) 7.5-325 MG  tablet TAKE 1 TABLET BY MOUTH EVERY 6 HOURS AS NEEDED FOR SEVERE PAIN. (Patient taking differently: Take 1 tablet by mouth every 6 (six) hours as needed for severe pain. ) 120 tablet 0  . potassium chloride (K-DUR) 10 MEQ tablet Take 1 tablet (10 mEq total) by mouth 2 (two) times daily. 180 tablet 3  . pravastatin (PRAVACHOL) 40 MG tablet TAKE ONE TABLET BY MOUTH DAILY. (Patient taking differently: TAKE ONE TABLET BY MOUTH DAILY IN THE EVENING) 90 tablet 0  . traZODone (DESYREL) 100 MG tablet TAKE 1 TABLET BY MOUTH AT BEDTIME AS NEEDED FOR SLEEP. 90 tablet 2  . triamcinolone cream (KENALOG) 0.1 % Apply 1 application topically 2 (two) times daily. To arms/hand 30 g 2   No current facility-administered medications for this visit.     Past Surgical History:  Procedure Laterality Date  . BIOPSY N/A 07/21/2014   Procedure: BIOPSY;  Surgeon: Daneil Dolin, MD;  Location: [facility];  Service: Endoscopy;  Laterality: N/A;  . CATARACT EXTRACTION W/PHACO Right 07/08/2016   Procedure: CATARACT EXTRACTION PHACO AND INTRAOCULAR LENS PLACEMENT RIGHT EYE;  Surgeon: Tonny , MD;  Location: [facility];  Service: Ophthalmology;  Laterality: Right;  CDE: 9.01  . CATARACT EXTRACTION W/PHACO Left 07/22/2016   Procedure: CATARACT EXTRACTION PHACO AND INTRAOCULAR LENS PLACEMENT LEFT EYE CDE=10.69;  Surgeon: Tonny , MD;  Location: [facility];  Service: Ophthalmology;  Laterality: Left;  left -   . COLONOSCOPY  2011   [facility]: normal colon, normal distal ileum  . COLONOSCOPY WITH PROPOFOL N/A 07/21/2014   Procedure: ATTEMPTED COLONOSCOPY WITH PROPOFOL-HAD TO STOP DUE TO BRADYCARDIA;  Surgeon: Daneil Dolin, MD;  Location: [facility];  Service: Endoscopy;  Laterality: N/A;  . EGD with enteroscopy  2011   [facility]: normal esophagus and stomach. Normal duodenum, jejunum. No evidence of AVMs.   . ESOPHAGOGASTRODUODENOSCOPY (EGD) WITH PROPOFOL N/A 07/21/2014   Procedure: ESOPHAGOGASTRODUODENOSCOPY (EGD) WITH PROPOFOL;   Surgeon: Daneil Dolin, MD;  Location: [facility];  Service: Endoscopy;  Laterality: N/A;  . HERNIA REPAIR    . KNEE ARTHROSCOPY WITH MEDIAL MENISECTOMY Left 11/06/2012   Procedure: KNEE ARTHROSCOPY WITH MEDIAL MENISECTOMY;  Surgeon: Carole Civil, MD;  Location: [facility];  Service: Orthopedics;  Laterality: Left;  . Lipoma removal     Stomach  . LUMBAR LAMINECTOMY/DECOMPRESSION MICRODISCECTOMY Left 08/30/2013   Procedure: LUMBAR LAMINECTOMY/DECOMPRESSION MICRODISCECTOMY LEFT  LUMBAR TWO THREE;  Surgeon: Otilio Connors, MD;  Location: [place] [facility];  Service: Neurosurgery;  Laterality: Left;  . RIGHT/LEFT HEART CATH AND CORONARY ANGIOGRAPHY N/A 09/22/2019   Procedure: RIGHT/LEFT HEART CATH AND CORONARY ANGIOGRAPHY;  Surgeon: Martinique, Peter M, MD;  Location: [place] CV LAB;  Service: Cardiovascular;  Laterality: N/A;  . SHOULDER SURGERY  Rght-rotator cuff  . TOOTH EXTRACTION       No Known Allergies    Family History  Problem Relation Age of Onset  . Heart disease Mother   . Hyperlipidemia Mother   . Hypertension Mother   . Depression Mother   . Diabetes Mother   . Rectal cancer Mother   . Cancer Mother   . Heart disease Father   . Hypertension Father   . Hyperlipidemia Father   . Diabetes Father   . Cancer Father   . Heart disease Sister   . Hyperlipidemia Sister   . Hypertension Sister   . Diabetes Sister   . Diabetes Brother   . Heart disease Sister   . Hyperlipidemia Sister   . Hypertension Sister   . Heart disease Sister   . Hyperlipidemia Sister   . Hypertension Sister   . Diabetes Sister   . Diabetes Brother      Social History Mr. Shaheen reports that he quit smoking about 20 years ago. His smoking use included cigarettes. He started smoking about 64 years ago. He has a 110.00 pack-year smoking history. He has never used smokeless tobacco. Mr. Quiroa reports no history of alcohol use.   Review of Systems CONSTITUTIONAL: No weight loss, fever, chills,  weakness or fatigue.  HEENT: Eyes: No visual loss, blurred vision, double vision or yellow sclerae.No hearing loss, sneezing, congestion, runny nose or sore throat.  SKIN: No rash or itching.  CARDIOVASCULAR: per hpi RESPIRATORY: No shortness of breath, cough or sputum.  GASTROINTESTINAL: No anorexia, nausea, vomiting or diarrhea. No abdominal pain or blood.  GENITOURINARY: No burning on urination, no polyuria NEUROLOGICAL: No headache, dizziness, syncope, paralysis, ataxia, numbness or tingling in the extremities. No change in bowel or bladder control.  MUSCULOSKELETAL: No muscle, back pain, joint pain or stiffness.  LYMPHATICS: No enlarged nodes. No history of splenectomy.  PSYCHIATRIC: No history of depression or anxiety.  ENDOCRINOLOGIC: No reports of sweating, cold or heat intolerance. No polyuria or polydipsia.  Marland Kitchen   Physical Examination Today's Vitals   12/14/19 0856  BP: 138/64  Pulse: 76  Temp: 97.8 F (36.6 C)  SpO2: 97%  Weight: 199 lb (90.3 kg)  Height: 4' 11"  (1.499 m)   Body mass index is 40.19 kg/m.  Gen: resting comfortably, no acute distress HEENT: no scleral icterus, pupils equal round and reactive, no palptable cervical adenopathy,  CV: RRR, no m/r/g, no jvd Resp: Clear to auscultation bilaterally GI: abdomen is soft, non-tender, non-distended, normal bowel sounds, no hepatosplenomegaly MSK: extremities are warm, 1+ bilateral LE edema Skin: warm, no rash Neuro:  no focal deficits Psych: appropriate affect   Diagnostic Studies  08/2019 echo IMPRESSIONS    1. Left ventricular ejection fraction, by visual estimation, is <20%. The  left ventricle has severely decreased function. There is no left  ventricular hypertrophy.  2. The left ventricle demonstrates regional wall motion abnormalities.  3. LVEF is approximately 15 to 20% with severe hypokinesis and akinesis  if the distal 1/3 of left ventricle.. Only extreme proximal portions of LV  thicken  during systole. Compared to echo from 2017, LVEF is now severely  depressed.  4. Global right ventricle has normal systolic function.The right  ventricular size is normal. No increase in right ventricular wall  thickness.  5. Left atrial size was normal.  6. Right atrial size was normal.  7. Mild mitral annular calcification.  8. The mitral valve is abnormal. Trivial mitral valve regurgitation.  9. The  tricuspid valve is normal in structure. Tricuspid valve  regurgitation is mild.  10. The aortic valve is abnormal. Aortic valve regurgitation is not  visualized. Mild aortic valve sclerosis without stenosis.  11. The pulmonic valve was normal in structure. Pulmonic valve  regurgitation is not visualized.  12. The inferior vena cava is dilated in size with <50% respiratory  variability, suggesting right atrial pressure of 15 mmHg.    08/2019 cath  LV end diastolic pressure is normal.  1. Mild nonobstructive CAD 2. Normal LV filling pressures 3. Normal right heart pressures. 4. Normal cardiac output. Index 2.7.   Assessment and Plan  1.Acute on chronicsystolicHF -weights up and down, some ongoing swelling. We will increase his metolazone to 2 times a week, continue bumex 26m bid - some increaed fatigue on higher coreg dosing, lower back to 12.527mbid - change losartan to entersto 24/2639mid.   F/u 1 month      JonArnoldo Lenis.D.

## 2019-12-15 ENCOUNTER — Telehealth: Payer: Self-pay | Admitting: *Deleted

## 2019-12-15 DIAGNOSIS — I5042 Chronic combined systolic (congestive) and diastolic (congestive) heart failure: Secondary | ICD-10-CM | POA: Diagnosis not present

## 2019-12-15 DIAGNOSIS — R531 Weakness: Secondary | ICD-10-CM | POA: Diagnosis not present

## 2019-12-15 DIAGNOSIS — M48061 Spinal stenosis, lumbar region without neurogenic claudication: Secondary | ICD-10-CM | POA: Diagnosis not present

## 2019-12-15 NOTE — Telephone Encounter (Signed)
Received call from [place], [facility] PT with [facility] [phone]~ telephone.   Requested VO to extend [facility] PT services 2x weekly x7 weeks, then 1x weekly x2 weeks for BLE strengthening, balance, and transfers.   VO given.

## 2019-12-15 NOTE — Telephone Encounter (Signed)
Agree with orders

## 2019-12-29 DIAGNOSIS — R531 Weakness: Secondary | ICD-10-CM | POA: Diagnosis not present

## 2019-12-29 DIAGNOSIS — I890 Lymphedema, not elsewhere classified: Secondary | ICD-10-CM | POA: Diagnosis not present

## 2019-12-29 DIAGNOSIS — R262 Difficulty in walking, not elsewhere classified: Secondary | ICD-10-CM | POA: Diagnosis not present

## 2019-12-29 DIAGNOSIS — I5032 Chronic diastolic (congestive) heart failure: Secondary | ICD-10-CM | POA: Diagnosis not present

## 2020-01-04 DIAGNOSIS — M79606 Pain in leg, unspecified: Secondary | ICD-10-CM | POA: Diagnosis not present

## 2020-01-04 DIAGNOSIS — G894 Chronic pain syndrome: Secondary | ICD-10-CM | POA: Diagnosis not present

## 2020-01-04 DIAGNOSIS — Z79899 Other long term (current) drug therapy: Secondary | ICD-10-CM | POA: Diagnosis not present

## 2020-01-10 ENCOUNTER — Telehealth: Payer: Self-pay | Admitting: *Deleted

## 2020-01-10 DIAGNOSIS — M170 Bilateral primary osteoarthritis of knee: Secondary | ICD-10-CM

## 2020-01-10 NOTE — Telephone Encounter (Signed)
Okay to place referral OA Bilat knee, gait instability He has seen Dr. Aline Brochure in the past

## 2020-01-10 NOTE — Telephone Encounter (Signed)
Received call from [place], [facility] PT with [facility].   Reports that patient is requesting to defer PT until after appointment with Dr. Luna Glasgow. Reports that she feels this is appropriate d/t amount of pain he is having.   Also requested VO for [facility] SN for wound care to BLE. Reports blister like weeping areas to BLE. VO given.

## 2020-01-10 NOTE — Telephone Encounter (Signed)
Referral order placed.

## 2020-01-10 NOTE — Telephone Encounter (Signed)
Received call from patient.   Reports that he continues to have pain in B knees and hip and cannot bear weight on leg.   Requested to have referral placed to Dr. Luna Glasgow.   MD please advise.

## 2020-01-15 DIAGNOSIS — M48061 Spinal stenosis, lumbar region without neurogenic claudication: Secondary | ICD-10-CM | POA: Diagnosis not present

## 2020-01-15 DIAGNOSIS — I5042 Chronic combined systolic (congestive) and diastolic (congestive) heart failure: Secondary | ICD-10-CM | POA: Diagnosis not present

## 2020-01-15 DIAGNOSIS — R531 Weakness: Secondary | ICD-10-CM | POA: Diagnosis not present

## 2020-01-18 ENCOUNTER — Ambulatory Visit: Payer: Medicare Other | Admitting: Orthopaedic Surgery

## 2020-01-18 ENCOUNTER — Encounter: Payer: Self-pay | Admitting: Orthopaedic Surgery

## 2020-01-18 ENCOUNTER — Other Ambulatory Visit: Payer: Self-pay

## 2020-01-18 ENCOUNTER — Ambulatory Visit: Payer: Medicare Other

## 2020-01-18 VITALS — BP 151/77 | HR 83 | Temp 97.4°F | Ht 59.0 in | Wt 203.0 lb

## 2020-01-18 DIAGNOSIS — G8929 Other chronic pain: Secondary | ICD-10-CM

## 2020-01-18 DIAGNOSIS — M25561 Pain in right knee: Secondary | ICD-10-CM

## 2020-01-18 DIAGNOSIS — G894 Chronic pain syndrome: Secondary | ICD-10-CM

## 2020-01-18 DIAGNOSIS — Q820 Hereditary lymphedema: Secondary | ICD-10-CM

## 2020-01-18 NOTE — Progress Notes (Signed)
Subjective:    Patient ID: Michael [name], male    DOB: 06/26/1946, 74 y.o.   MRN: [id number]  HPI He has pain of the right knee which has gradually gotten worse over the last several months.  He is confined to a wheelchair secondary to chronic lymphedema of the lower extremities.  He has been in wheelchair for over three years.  He recently saw Dr. Buelah Manis and she would like me to see him about his knees. I have reviewed her notes.  He has no trauma, no redness but has crepitus.  He can stand but only for a few minutes.  He has swelling and popping but no redness or numbness.  Nothing seems to help.  He is on Neurontin and uses voltaren gel.   Review of Systems  Constitutional: Positive for activity change.  Respiratory: Positive for shortness of breath.   Musculoskeletal: Positive for arthralgias, back pain, gait problem and joint swelling.  All other systems reviewed and are negative.  For Review of Systems, all other systems reviewed and are negative.  The following is a summary of the past history medically, past history surgically, known current medicines, social history and family history.  This information is gathered electronically by the computer from prior information and documentation.  I review this each visit and have found including this information at this point in the chart is beneficial and informative.   Past Medical History:  Diagnosis Date  . Arthritis   . Congestive heart disease ([place]) 11/2015  . COPD (chronic obstructive pulmonary disease) ([place])   . DDD (degenerative disc disease), lumbosacral   . Depression   . Diabetic neuropathy ([place])   . Diastolic dysfunction   . Essential hypertension   . GERD (gastroesophageal reflux disease)   . Hyperlipidemia   . Iron deficiency anemia   . Left knee DJD   . Lumbar spinal stenosis   . Lymphedema 11/2015   BOTH LEGS  . NASH (nonalcoholic steatohepatitis)   . Peripheral edema   . Peripheral edema 10/05/2015  . Type  2 diabetes mellitus ([place])     Past Surgical History:  Procedure Laterality Date  . BIOPSY N/A 07/21/2014   Procedure: BIOPSY;  Surgeon: Daneil Dolin, MD;  Location: [facility];  Service: Endoscopy;  Laterality: N/A;  . CATARACT EXTRACTION W/PHACO Right 07/08/2016   Procedure: CATARACT EXTRACTION PHACO AND INTRAOCULAR LENS PLACEMENT RIGHT EYE;  Surgeon: Tonny Branch, MD;  Location: [facility];  Service: Ophthalmology;  Laterality: Right;  CDE: 9.01  . CATARACT EXTRACTION W/PHACO Left 07/22/2016   Procedure: CATARACT EXTRACTION PHACO AND INTRAOCULAR LENS PLACEMENT LEFT EYE CDE=10.69;  Surgeon: Tonny Branch, MD;  Location: [facility];  Service: Ophthalmology;  Laterality: Left;  left -   . COLONOSCOPY  2011   [facility]: normal colon, normal distal ileum  . COLONOSCOPY WITH PROPOFOL N/A 07/21/2014   Procedure: ATTEMPTED COLONOSCOPY WITH PROPOFOL-HAD TO STOP DUE TO BRADYCARDIA;  Surgeon: Daneil Dolin, MD;  Location: [facility];  Service: Endoscopy;  Laterality: N/A;  . EGD with enteroscopy  2011   [facility]: normal esophagus and stomach. Normal duodenum, jejunum. No evidence of AVMs.   . ESOPHAGOGASTRODUODENOSCOPY (EGD) WITH PROPOFOL N/A 07/21/2014   Procedure: ESOPHAGOGASTRODUODENOSCOPY (EGD) WITH PROPOFOL;  Surgeon: Daneil Dolin, MD;  Location: [facility];  Service: Endoscopy;  Laterality: N/A;  . HERNIA REPAIR    . KNEE ARTHROSCOPY WITH MEDIAL MENISECTOMY Left 11/06/2012   Procedure: KNEE ARTHROSCOPY WITH MEDIAL MENISECTOMY;  Surgeon: Dorothyann Peng  Vela Prose, MD;  Location: [facility];  Service: Orthopedics;  Laterality: Left;  . Lipoma removal     Stomach  . LUMBAR LAMINECTOMY/DECOMPRESSION MICRODISCECTOMY Left 08/30/2013   Procedure: LUMBAR LAMINECTOMY/DECOMPRESSION MICRODISCECTOMY LEFT  LUMBAR TWO THREE;  Surgeon: Otilio Connors, MD;  Location: [facility];  Service: Neurosurgery;  Laterality: Left;  . RIGHT/LEFT HEART CATH AND CORONARY ANGIOGRAPHY N/A 09/22/2019   Procedure: RIGHT/LEFT HEART CATH AND CORONARY  ANGIOGRAPHY;  Surgeon: Martinique, Peter M, MD;  Location: [facility];  Service: Cardiovascular;  Laterality: N/A;  . SHOULDER SURGERY     Rght-rotator cuff  . TOOTH EXTRACTION      Current Outpatient Medications on File Prior to Visit  Medication Sig Dispense Refill  . aspirin EC 81 MG tablet Take 81 mg by mouth daily.    . B-D ULTRAFINE III SHORT PEN 31G X 8 MM MISC USE AS DIRECTED DAILY WITH LANTUS. (Patient taking differently: See admin instructions. USE AS DIRECTED DAILY WITH LANTUS) 100 each 0  . Blood Glucose Monitoring Suppl (BLOOD GLUCOSE SYSTEM PAK) KIT Please dispense based on patient and insurance preference. Use as directed to monitor FSBS 2x daily. Dx: E11.9 1 each 1  . bumetanide (BUMEX) 2 MG tablet Take 1.5 tablets (3 mg total) by mouth 2 (two) times daily. 270 tablet 3  . carvedilol (COREG) 12.5 MG tablet Take 1 tablet (12.5 mg total) by mouth 2 (two) times daily. 180 tablet 3  . diclofenac sodium (VOLTAREN) 1 % GEL Apply 4 g topically 4 (four) times daily as needed (pain). 100 g 3  . Ferrous Sulfate (IRON) 325 (65 FE) MG TABS Take 1 tablet by mouth daily.     Marland Kitchen gabapentin (NEURONTIN) 400 MG capsule Take 1 capsule (400 mg total) by mouth 3 (three) times daily. 90 capsule 0  . Glucose Blood (BLOOD GLUCOSE TEST STRIPS) STRP Please dispense based on patient and insurance preference. Use as directed to monitor FSBS 2x daily. Dx: E11.9 100 each 1  . Insulin Glargine (LANTUS SOLOSTAR) 100 UNIT/ML Solostar Pen Inject 5 Units into the skin daily at 10 pm. 15 mL 0  . Lancets MISC Please dispense based on patient and insurance preference. Use as directed to monitor FSBS 2x daily. Dx: E11.9 100 each 1  . levothyroxine (SYNTHROID, LEVOTHROID) 100 MCG tablet TAKE 1 TABLET BY MOUTH DAILY BEFORE BREAKFAST. (Patient taking differently: Take 100 mcg by mouth daily before breakfast. ) 30 tablet 0  . magnesium oxide (MAG-OX) 400 MG tablet Take 1 tablet (400 mg total) by mouth 2 (two) times  daily. 60 tablet 11  . metFORMIN (GLUCOPHAGE) 1000 MG tablet TAKE ONE TABLET BY MOUTH TWICE DAILY WITH A MEAL. 180 tablet 3  . metolazone (ZAROXOLYN) 2.5 MG tablet Take 2.5 mg (1 tablet) twice weekly. 24 tablet 3  . nystatin cream (MYCOSTATIN) APPLY TO AFFECTED AREA TWICE DAILY. 30 g 0  . omeprazole (PRILOSEC) 40 MG capsule Take 40 mg by mouth daily.    Marland Kitchen oxyCODONE-acetaminophen (PERCOCET) 7.5-325 MG tablet TAKE 1 TABLET BY MOUTH EVERY 6 HOURS AS NEEDED FOR SEVERE PAIN. (Patient taking differently: Take 1 tablet by mouth every 6 (six) hours as needed for severe pain. ) 120 tablet 0  . potassium chloride (K-DUR) 10 MEQ tablet Take 1 tablet (10 mEq total) by mouth 2 (two) times daily. 180 tablet 3  . pravastatin (PRAVACHOL) 40 MG tablet TAKE ONE TABLET BY MOUTH DAILY. (Patient taking differently: TAKE ONE TABLET BY MOUTH DAILY IN  THE EVENING) 90 tablet 0  . sacubitril-valsartan (ENTRESTO) 24-26 MG Take 1 tablet by mouth 2 (two) times daily. 60 tablet 6  . traZODone (DESYREL) 100 MG tablet TAKE 1 TABLET BY MOUTH AT BEDTIME AS NEEDED FOR SLEEP. 90 tablet 2  . triamcinolone cream (KENALOG) 0.1 % Apply 1 application topically 2 (two) times daily. To arms/hand 30 g 2   No current facility-administered medications on file prior to visit.    Social History   Socioeconomic History  . Marital status: Widowed    Spouse name: Not on file  . Number of children: Not on file  . Years of education: 32  . Highest education level: Not on file  Occupational History  . Not on file  Tobacco Use  . Smoking status: Former Smoker    Packs/day: 2.50    Years: 44.00    Pack years: 110.00    Types: Cigarettes    Start date: 05/07/1955    Quit date: 10/01/1999    Years since quitting: 20.3  . Smokeless tobacco: Never Used  Substance and Sexual Activity  . Alcohol use: No    Alcohol/week: 0.0 standard drinks  . Drug use: No  . Sexual activity: Yes    Birth control/protection: None  Other Topics Concern  .  Not on file  Social History Narrative  . Not on file   Social Determinants of Health   Financial Resource Strain:   . Difficulty of Paying Living Expenses:   Food Insecurity:   . Worried About Charity fundraiser in the Last Year:   . Arboriculturist in the Last Year:   Transportation Needs:   . Film/video editor (Medical):   Marland Kitchen Lack of Transportation (Non-Medical):   Physical Activity:   . Days of Exercise per Week:   . Minutes of Exercise per Session:   Stress:   . Feeling of Stress :   Social Connections:   . Frequency of Communication with Friends and Family:   . Frequency of Social Gatherings with Friends and Family:   . Attends Religious Services:   . Active Member of Clubs or Organizations:   . Attends Archivist Meetings:   Marland Kitchen Marital Status:   Intimate Partner Violence:   . Fear of Current or Ex-Partner:   . Emotionally Abused:   Marland Kitchen Physically Abused:   . Sexually Abused:     Family History  Problem Relation Age of Onset  . Heart disease Mother   . Hyperlipidemia Mother   . Hypertension Mother   . Depression Mother   . Diabetes Mother   . Rectal cancer Mother   . Cancer Mother   . Heart disease Father   . Hypertension Father   . Hyperlipidemia Father   . Diabetes Father   . Cancer Father   . Heart disease Sister   . Hyperlipidemia Sister   . Hypertension Sister   . Diabetes Sister   . Diabetes Brother   . Heart disease Sister   . Hyperlipidemia Sister   . Hypertension Sister   . Heart disease Sister   . Hyperlipidemia Sister   . Hypertension Sister   . Diabetes Sister   . Diabetes Brother     BP (!) 151/77   Pulse 83   Temp (!) 97.4 F (36.3 C)   Ht 4' 11"  (1.499 m)   Wt 203 lb (92.1 kg)   BMI 41.00 kg/m   Body mass index is 41 kg/m.  Objective:   Physical Exam Vitals and nursing note reviewed.  Constitutional:      Appearance: He is well-developed.  HENT:     Head: Normocephalic and atraumatic.  Eyes:      Conjunctiva/sclera: Conjunctivae normal.     Pupils: Pupils are equal, round, and reactive to light.  Cardiovascular:     Rate and Rhythm: Normal rate and regular rhythm.  Pulmonary:     Effort: Pulmonary effort is normal.  Abdominal:     Palpations: Abdomen is soft.  Musculoskeletal:     Cervical back: Normal range of motion and neck supple.       Legs:  Skin:    General: Skin is warm and dry.  Neurological:     Mental Status: He is alert and oriented to person, place, and time.     Cranial Nerves: No cranial nerve deficit.     Motor: No abnormal muscle tone.     Coordination: Coordination normal.     Deep Tendon Reflexes: Reflexes are normal and symmetric. Reflexes normal.  Psychiatric:        Behavior: Behavior normal.        Thought Content: Thought content normal.        Judgment: Judgment normal.    X-rays were done of the right knee, reported separately.  I have reviewed Dr. Dorian Heckle notes.       Assessment & Plan:   Encounter Diagnoses  Name Primary?  . Chronic pain of right knee Yes  . Chronic pain syndrome   . Hereditary lymphedema of legs    PROCEDURE NOTE:  The patient requests injections of the right knee , verbal consent was obtained.  The right knee was prepped appropriately after time out was performed.   Sterile technique was observed and injection of 1 cc of Depo-Medrol 40 mg with several cc's of plain xylocaine. Anesthesia was provided by ethyl chloride and a 20-gauge needle was used to inject the knee area. The injection was tolerated well.  A band aid dressing was applied.  The patient was advised to apply ice later today and tomorrow to the injection sight as needed.  He may need MRI of the right knee.  Return in two weeks.  Call if any problem.  Precautions discussed.   Electronically Signed Sanjuana Kava, MD 4/20/20219:09 AM

## 2020-01-25 ENCOUNTER — Other Ambulatory Visit (HOSPITAL_COMMUNITY)
Admission: RE | Admit: 2020-01-25 | Discharge: 2020-01-25 | Disposition: A | Discharge status: Home / Self Care | Payer: Medicare Other | Source: Ambulatory Visit | Attending: Cardiology | Admitting: Cardiology

## 2020-01-25 ENCOUNTER — Other Ambulatory Visit: Payer: Self-pay

## 2020-01-25 DIAGNOSIS — I5023 Acute on chronic systolic (congestive) heart failure: Secondary | ICD-10-CM | POA: Insufficient documentation

## 2020-01-25 LAB — BASIC METABOLIC PANEL
Anion gap: 10 (ref 5–15)
BUN: 14 mg/dL (ref 8–23)
CO2: 27 mmol/L (ref 22–32)
Calcium: 8.8 mg/dL — ABNORMAL LOW (ref 8.9–10.3)
Chloride: 98 mmol/L (ref 98–111)
Creatinine, Ser: 0.75 mg/dL (ref 0.61–1.24)
GFR calc Af Amer: >60 mL/min (ref >60)
GFR calc non Af Amer: >60 mL/min (ref >60)
Glucose, Bld: 135 mg/dL — ABNORMAL HIGH (ref 70–99)
Potassium: 4.1 mmol/L (ref 3.5–5.1)
Sodium: 135 mmol/L (ref 135–145)

## 2020-01-25 LAB — MAGNESIUM: Magnesium: 1.8 mg/dL (ref 1.7–2.4)

## 2020-01-27 ENCOUNTER — Telehealth: Payer: Self-pay | Admitting: *Deleted

## 2020-01-27 NOTE — Telephone Encounter (Signed)
Received call from [place], [facility] PT with [facility].   Reports that patient is requesting to D/C PT services. States that he has been seen at orthopedics and received B injections in knees with no improvement. Reports that he is looking into replacement and would like to D/C PT services at this time.   VO given to D/C per patient request.

## 2020-01-27 NOTE — Telephone Encounter (Signed)
noted 

## 2020-01-28 ENCOUNTER — Telehealth: Payer: Self-pay | Admitting: *Deleted

## 2020-01-28 DIAGNOSIS — R531 Weakness: Secondary | ICD-10-CM | POA: Diagnosis not present

## 2020-01-28 DIAGNOSIS — I890 Lymphedema, not elsewhere classified: Secondary | ICD-10-CM | POA: Diagnosis not present

## 2020-01-28 DIAGNOSIS — R262 Difficulty in walking, not elsewhere classified: Secondary | ICD-10-CM | POA: Diagnosis not present

## 2020-01-28 DIAGNOSIS — I5032 Chronic diastolic (congestive) heart failure: Secondary | ICD-10-CM | POA: Diagnosis not present

## 2020-01-28 NOTE — Telephone Encounter (Signed)
-----   Message from [name], MD sent at 01/26/2020  9:40 AM EDT ----- Normal labs  [name] MD

## 2020-01-28 NOTE — Telephone Encounter (Signed)
Pt aware - routed to pcp  

## 2020-02-01 ENCOUNTER — Encounter: Payer: Self-pay | Admitting: Orthopaedic Surgery

## 2020-02-01 ENCOUNTER — Ambulatory Visit (INDEPENDENT_AMBULATORY_CARE_PROVIDER_SITE_OTHER): Payer: Medicare Other | Admitting: Orthopaedic Surgery

## 2020-02-01 ENCOUNTER — Other Ambulatory Visit: Payer: Self-pay

## 2020-02-01 VITALS — BP 173/98 | HR 77 | Ht 59.0 in | Wt 203.0 lb

## 2020-02-01 DIAGNOSIS — G8929 Other chronic pain: Secondary | ICD-10-CM

## 2020-02-01 DIAGNOSIS — M25561 Pain in right knee: Secondary | ICD-10-CM

## 2020-02-01 NOTE — Progress Notes (Signed)
PROCEDURE NOTE:  The patient requests injections of the right knee , verbal consent was obtained.  The right knee was prepped appropriately after time out was performed.   Sterile technique was observed and injection of 1 cc of Depo-Medrol 40 mg with several cc's of plain xylocaine. Anesthesia was provided by ethyl chloride and a 20-gauge needle was used to inject the knee area. The injection was tolerated well.  A band aid dressing was applied.  The patient was advised to apply ice later today and tomorrow to the injection sight as needed.  I will get MRI of the right knee as he is not improving and has great difficulty walking.  Return in three weeks.  Call if any problem.  Precautions discussed.   Electronically Signed Sanjuana Kava, MD 5/4/20219:12 AM

## 2020-02-03 LAB — HM DIABETES EYE EXAM

## 2020-02-07 ENCOUNTER — Telehealth: Payer: Self-pay | Admitting: Family Medicine

## 2020-02-07 NOTE — Chronic Care Management (AMB) (Signed)
°  Chronic Care Management   Note  02/07/2020 Name: Michael [name] MRN: [id number] DOB: Jan 31, 1946  Michael [name] is a 74 y.o. year old male who is a primary care patient of [place], Modena Nunnery, MD. I reached out to [name] by phone today in response to a referral sent by Michael [name]'s PCP, [name], MD.   Michael [name] was given information about Chronic Care Management services today including:  1. CCM service includes personalized support from designated clinical staff supervised by his physician, including individualized plan of care and coordination with other care providers 2. 24/7 contact phone numbers for assistance for urgent and routine care needs. 3. Service will only be billed when office clinical staff spend 20 minutes or more in a month to coordinate care. 4. Only one practitioner may furnish and bill the service in a calendar month. 5. The patient may stop CCM services at any time (effective at the end of the month) by phone call to the office staff.   Patient agreed to services and verbal consent obtained.   Follow up plan:   [place]

## 2020-02-08 ENCOUNTER — Encounter: Payer: Self-pay | Admitting: Cardiology

## 2020-02-08 ENCOUNTER — Telehealth: Payer: Self-pay | Admitting: *Deleted

## 2020-02-08 ENCOUNTER — Ambulatory Visit (INDEPENDENT_AMBULATORY_CARE_PROVIDER_SITE_OTHER): Payer: Medicare Other | Admitting: Cardiology

## 2020-02-08 ENCOUNTER — Other Ambulatory Visit: Payer: Self-pay

## 2020-02-08 VITALS — BP 142/70 | HR 69 | Ht 59.0 in | Wt 203.0 lb

## 2020-02-08 DIAGNOSIS — I5023 Acute on chronic systolic (congestive) heart failure: Secondary | ICD-10-CM | POA: Diagnosis not present

## 2020-02-08 MED ORDER — METOLAZONE 2.5 MG PO TABS
ORAL_TABLET | ORAL | 11 refills | Status: DC
Start: 2020-02-08 — End: 2024-01-22

## 2020-02-08 MED ORDER — PANTOPRAZOLE SODIUM 40 MG PO TBEC
40.0000 mg | DELAYED_RELEASE_TABLET | Freq: Every day | ORAL | 11 refills | Status: AC
Start: 2020-02-08 — End: ?

## 2020-02-08 MED ORDER — ENTRESTO 49-51 MG PO TABS: 1.0000 | ORAL_TABLET | Freq: Two times a day (BID) | ORAL | 11 refills | Status: DC

## 2020-02-08 NOTE — Progress Notes (Signed)
Clinical Summary [name] is a 74 y.o.male seen today for follow up of the following medical problems.    1. Chroniccombined systolic/diastolic heart failure - several year history of diastolic HF. New diagnosis of systolic HF during 85/4627 admission 08/2019 echo: <20%, akinesis distal 1/3 08/2019 cath mild CAD, normal filling pressures  -discharge weight 188 lbs. Normal filling pressuresby cath at that time   - fatigue on higher coreg, we lowered to 12.3m bid - changed losartan to entresto 24/223mbid last visits, labs are stable - was to change metolazone to 2.72m54mwice a week however only taking once weekly.  - clinic weights stable 203 lbs   2. Chest pain - episode of chest burning awoke him from sleep, lasted about 30 minutes. 2 episodes while in the recliner. Better with pepto.  - cath 08/2019 without significant disease        SH: veteran, he is a [place]Served in the [employer] is Michael Mylarurts also a patient of mine, recently passed awa[place] [employer] Brewingbor day 2020 from pancreatic cancer  Completed covid vaccine x 2    Past Medical History:  Diagnosis Date  . Arthritis   . Congestive heart disease (HCCFlorence3/2017  . COPD (chronic obstructive pulmonary disease) (HCCTerral . DDD (degenerative disc disease), lumbosacral   . Depression   . Diabetic neuropathy ([place]CC[place] . Diastolic dysfunction   . Essential hypertension   . GERD (gastroesophageal reflux disease)   . Hyperlipidemia   . Iron deficiency anemia   . Left knee DJD   . Lumbar spinal stenosis   . Lymphedema 11/2015   BOTH LEGS  . NASH (nonalcoholic steatohepatitis)   . Peripheral edema   . Peripheral edema 10/05/2015  . Type 2 diabetes mellitus (HCC)      No Known Allergies   Current Outpatient Medications  Medication Sig Dispense Refill  . aspirin EC 81 MG tablet Take 81 mg by mouth daily.    . B-D ULTRAFINE III SHORT PEN 31G X 8 MM MISC USE AS DIRECTED DAILY WITH LANTUS.  (Patient taking differently: See admin instructions. USE AS DIRECTED DAILY WITH LANTUS) 100 each 0  . Blood Glucose Monitoring Suppl (BLOOD GLUCOSE SYSTEM PAK) KIT Please dispense based on patient and insurance preference. Use as directed to monitor FSBS 2x daily. Dx: E11.9 1 each 1  . bumetanide (BUMEX) 2 MG tablet Take 1.5 tablets (3 mg total) by mouth 2 (two) times daily. 270 tablet 3  . carvedilol (COREG) 12.5 MG tablet Take 1 tablet (12.5 mg total) by mouth 2 (two) times daily. 180 tablet 3  . diclofenac sodium (VOLTAREN) 1 % GEL Apply 4 g topically 4 (four) times daily as needed (pain). 100 g 3  . Ferrous Sulfate (IRON) 325 (65 FE) MG TABS Take 1 tablet by mouth daily.     . gMarland Kitchenbapentin (NEURONTIN) 400 MG capsule Take 1 capsule (400 mg total) by mouth 3 (three) times daily. 90 capsule 0  . Glucose Blood (BLOOD GLUCOSE TEST STRIPS) STRP Please dispense based on patient and insurance preference. Use as directed to monitor FSBS 2x daily. Dx: E11.9 100 each 1  . Insulin Glargine (LANTUS SOLOSTAR) 100 UNIT/ML Solostar Pen Inject 5 Units into the skin daily at 10 pm. 15 mL 0  . Lancets MISC Please dispense based on patient and insurance preference. Use as directed to monitor FSBS 2x daily. Dx: E11.9 100 each 1  . levothyroxine (SYNTHROID, LEVOTHROID) 100 MCG tablet  TAKE 1 TABLET BY MOUTH DAILY BEFORE BREAKFAST. (Patient taking differently: Take 100 mcg by mouth daily before breakfast. ) 30 tablet 0  . magnesium oxide (MAG-OX) 400 MG tablet Take 1 tablet (400 mg total) by mouth 2 (two) times daily. 60 tablet 11  . metFORMIN (GLUCOPHAGE) 1000 MG tablet TAKE ONE TABLET BY MOUTH TWICE DAILY WITH A MEAL. 180 tablet 3  . metolazone (ZAROXOLYN) 2.5 MG tablet Take 2.5 mg (1 tablet) twice weekly. 24 tablet 3  . nystatin cream (MYCOSTATIN) APPLY TO AFFECTED AREA TWICE DAILY. 30 g 0  . omeprazole (PRILOSEC) 40 MG capsule Take 40 mg by mouth daily.    Marland Kitchen oxyCODONE-acetaminophen (PERCOCET) 7.5-325 MG tablet TAKE 1  TABLET BY MOUTH EVERY 6 HOURS AS NEEDED FOR SEVERE PAIN. (Patient taking differently: Take 1 tablet by mouth every 6 (six) hours as needed for severe pain. ) 120 tablet 0  . potassium chloride (K-DUR) 10 MEQ tablet Take 1 tablet (10 mEq total) by mouth 2 (two) times daily. 180 tablet 3  . pravastatin (PRAVACHOL) 40 MG tablet TAKE ONE TABLET BY MOUTH DAILY. (Patient taking differently: TAKE ONE TABLET BY MOUTH DAILY IN THE EVENING) 90 tablet 0  . sacubitril-valsartan (ENTRESTO) 24-26 MG Take 1 tablet by mouth 2 (two) times daily. 60 tablet 6  . traZODone (DESYREL) 100 MG tablet TAKE 1 TABLET BY MOUTH AT BEDTIME AS NEEDED FOR SLEEP. 90 tablet 2  . triamcinolone cream (KENALOG) 0.1 % Apply 1 application topically 2 (two) times daily. To arms/hand 30 g 2   No current facility-administered medications for this visit.     Past Surgical History:  Procedure Laterality Date  . BIOPSY N/A 07/21/2014   Procedure: BIOPSY;  Surgeon: Daneil Dolin, MD;  Location: [facility];  Service: Endoscopy;  Laterality: N/A;  . CATARACT EXTRACTION W/PHACO Right 07/08/2016   Procedure: CATARACT EXTRACTION PHACO AND INTRAOCULAR LENS PLACEMENT RIGHT EYE;  Surgeon: Tonny , MD;  Location: [facility];  Service: Ophthalmology;  Laterality: Right;  CDE: 9.01  . CATARACT EXTRACTION W/PHACO Left 07/22/2016   Procedure: CATARACT EXTRACTION PHACO AND INTRAOCULAR LENS PLACEMENT LEFT EYE CDE=10.69;  Surgeon: Tonny , MD;  Location: [facility];  Service: Ophthalmology;  Laterality: Left;  left -   . COLONOSCOPY  2011   [facility]: normal colon, normal distal ileum  . COLONOSCOPY WITH PROPOFOL N/A 07/21/2014   Procedure: ATTEMPTED COLONOSCOPY WITH PROPOFOL-HAD TO STOP DUE TO BRADYCARDIA;  Surgeon: Daneil Dolin, MD;  Location: [facility];  Service: Endoscopy;  Laterality: N/A;  . EGD with enteroscopy  2011   [facility]: normal esophagus and stomach. Normal duodenum, jejunum. No evidence of AVMs.   . ESOPHAGOGASTRODUODENOSCOPY (EGD) WITH  PROPOFOL N/A 07/21/2014   Procedure: ESOPHAGOGASTRODUODENOSCOPY (EGD) WITH PROPOFOL;  Surgeon: Daneil Dolin, MD;  Location: [facility];  Service: Endoscopy;  Laterality: N/A;  . HERNIA REPAIR    . KNEE ARTHROSCOPY WITH MEDIAL MENISECTOMY Left 11/06/2012   Procedure: KNEE ARTHROSCOPY WITH MEDIAL MENISECTOMY;  Surgeon: Carole Civil, MD;  Location: [facility];  Service: Orthopedics;  Laterality: Left;  . Lipoma removal     Stomach  . LUMBAR LAMINECTOMY/DECOMPRESSION MICRODISCECTOMY Left 08/30/2013   Procedure: LUMBAR LAMINECTOMY/DECOMPRESSION MICRODISCECTOMY LEFT  LUMBAR TWO THREE;  Surgeon: Otilio Connors, MD;  Location: [place] [facility];  Service: Neurosurgery;  Laterality: Left;  . RIGHT/LEFT HEART CATH AND CORONARY ANGIOGRAPHY N/A 09/22/2019   Procedure: RIGHT/LEFT HEART CATH AND CORONARY ANGIOGRAPHY;  Surgeon: Martinique, Peter M, MD;  Location: [facility]  INVASIVE CV LAB;  Service: Cardiovascular;  Laterality: N/A;  . SHOULDER SURGERY     Rght-rotator cuff  . TOOTH EXTRACTION       No Known Allergies    Family History  Problem Relation Age of Onset  . Heart disease Mother   . Hyperlipidemia Mother   . Hypertension Mother   . Depression Mother   . Diabetes Mother   . Rectal cancer Mother   . Cancer Mother   . Heart disease Father   . Hypertension Father   . Hyperlipidemia Father   . Diabetes Father   . Cancer Father   . Heart disease Sister   . Hyperlipidemia Sister   . Hypertension Sister   . Diabetes Sister   . Diabetes Brother   . Heart disease Sister   . Hyperlipidemia Sister   . Hypertension Sister   . Heart disease Sister   . Hyperlipidemia Sister   . Hypertension Sister   . Diabetes Sister   . Diabetes Brother      Social History Michael [name] reports that he quit smoking about 20 years ago. His smoking use included cigarettes. He started smoking about 64 years ago. He has a 110.00 pack-year smoking history. He has never used smokeless tobacco. Michael [name] reports no history  of alcohol use.   Review of Systems CONSTITUTIONAL: No weight loss, fever, chills, weakness or fatigue.  HEENT: Eyes: No visual loss, blurred vision, double vision or yellow sclerae.No hearing loss, sneezing, congestion, runny nose or sore throat.  SKIN: No rash or itching.  CARDIOVASCULAR: per hpi RESPIRATORY: No shortness of breath, cough or sputum.  GASTROINTESTINAL: No anorexia, nausea, vomiting or diarrhea. No abdominal pain or blood.  GENITOURINARY: No burning on urination, no polyuria NEUROLOGICAL: No headache, dizziness, syncope, paralysis, ataxia, numbness or tingling in the extremities. No change in bowel or bladder control.  MUSCULOSKELETAL: No muscle, back pain, joint pain or stiffness.  LYMPHATICS: No enlarged nodes. No history of splenectomy.  PSYCHIATRIC: No history of depression or anxiety.  ENDOCRINOLOGIC: No reports of sweating, cold or heat intolerance. No polyuria or polydipsia.  Marland Kitchen   Physical Examination Today's Vitals   02/08/20 0909  BP: (!) 142/70  Pulse: 69  SpO2: 98%  Weight: 203 lb (92.1 kg)  Height: 4' 11"  (1.499 m)   Body mass index is 41 kg/m.  Gen: resting comfortably, no acute distress HEENT: no scleral icterus, pupils equal round and reactive, no palptable cervical adenopathy,  CV: RRR, no m/r/g, no jvd Resp: Clear to auscultation bilaterally GI: abdomen is soft, non-tender, non-distended, normal bowel sounds, no hepatosplenomegaly MSK: extremities are warm, 2+ bilateral LE edema Skin: warm, no rash Neuro:  no focal deficits Psych: appropriate affect   Diagnostic Studies  08/2019 echo IMPRESSIONS    1. Left ventricular ejection fraction, by visual estimation, is <20%. The  left ventricle has severely decreased function. There is no left  ventricular hypertrophy.  2. The left ventricle demonstrates regional wall motion abnormalities.  3. LVEF is approximately 15 to 20% with severe hypokinesis and akinesis  if the distal 1/3 of  left ventricle.. Only extreme proximal portions of LV  thicken during systole. Compared to echo from 2017, LVEF is now severely  depressed.  4. Global right ventricle has normal systolic function.The right  ventricular size is normal. No increase in right ventricular wall  thickness.  5. Left atrial size was normal.  6. Right atrial size was normal.  7. Mild mitral annular calcification.  8.  The mitral valve is abnormal. Trivial mitral valve regurgitation.  9. The tricuspid valve is normal in structure. Tricuspid valve  regurgitation is mild.  10. The aortic valve is abnormal. Aortic valve regurgitation is not  visualized. Mild aortic valve sclerosis without stenosis.  11. The pulmonic valve was normal in structure. Pulmonic valve  regurgitation is not visualized.  12. The inferior vena cava is dilated in size with <50% respiratory  variability, suggesting right atrial pressure of 15 mmHg.    08/2019 cath  LV end diastolic pressure is normal.  1. Mild nonobstructive CAD 2. Normal LV filling pressures 3. Normal right heart pressures. 4. Normal cardiac output. Index 2.7.   Assessment and Plan  1.Acute on chronicsystolicHF -at 498 lbs normal filling pressures by 08/2019 cath - weights 203 lbs here and stable from last visit. He will increase metolazone to 2.26m on Mon and Thurs, continue bumex 316mbid - increase entresto to 49/5170mid, repeat BMET/Mg in 2 weeks - fatigue on higher coreg dosing. - likely repeat echo June/July to reassess LVEF   Virtual visit 3 weeks, likely increase entresto further   JonArnoldo Lenis.D.,

## 2020-02-08 NOTE — Telephone Encounter (Signed)
Call placed to patient. LMTRC.  

## 2020-02-08 NOTE — Telephone Encounter (Signed)
-----   Message from [name], MD sent at 02/08/2020  7:39 AM EDT ----- Regarding: Please triage pt  ----- Message ----- From: Edythe Clarity, [facility] Sent: 02/07/2020   4:56 PM EDT To: [name], MD  Dr. Buelah Manis,  Mr. Mozer called and left me a message today needing something for diarrhea called in to [facility].  Michela Pitcher he has been having diarrhea for two weeks and wanted a prescription called in.  Left him VM he may need appointment with you for prescription.  Thanks, Gerald Stabs

## 2020-02-08 NOTE — Telephone Encounter (Signed)
Call placed to patient to inquire.   Reports loose stools 3-5x daily x2 weeks. Denies abd pain, blood in stool, N/V, fever.   Per MD, decrease magnesium to QD. Use OTC Imodium as directed.   Call placed to patient and patient made aware.

## 2020-02-08 NOTE — Patient Instructions (Signed)
Medication Instructions:  Your physician has recommended you make the following change in your medication:  Stop Taking Prilosec  Start Taking Protonix 40 mg Daily  Increase Entresto to 49-51 Two Times Daily  Take Metolazone 2.5 Two Times Weekly on Monday and Thursday.   *If you need a refill on your cardiac medications before your next appointment, please call your pharmacy*   Lab Work: Your physician recommends that you return for lab work in: 2 Weeks   If you have labs (blood work) drawn today and your tests are completely normal, you will receive your results only by: Marland Kitchen MyChart Message (if you have MyChart) OR . A paper copy in the mail If you have any lab test that is abnormal or we need to change your treatment, we will call you to review the results.   Testing/Procedures: NONE    Follow-Up: At [facility], you and your health needs are our priority.  As part of our continuing mission to provide you with exceptional heart care, we have created designated Provider Care Teams.  These Care Teams include your primary Cardiologist (physician) and Advanced Practice Providers (APPs -  Physician Assistants and Nurse Practitioners) who all work together to provide you with the care you need, when you need it.  We recommend signing up for the patient portal called "MyChart".  Sign up information is provided on this After Visit Summary.  MyChart is used to connect with patients for Virtual Visits (Telemedicine).  Patients are able to view lab/test results, encounter notes, upcoming appointments, etc.  Non-urgent messages can be sent to your provider as well.   To learn more about what you can do with MyChart, go to [email or web address].    Your next appointment:   3 week(s)  The format for your next appointment:   Virtual Visit   Provider:   [name], MD   Other Instructions Thank you for choosing [place] [facility]!

## 2020-02-14 DIAGNOSIS — R531 Weakness: Secondary | ICD-10-CM | POA: Diagnosis not present

## 2020-02-14 DIAGNOSIS — I5042 Chronic combined systolic (congestive) and diastolic (congestive) heart failure: Secondary | ICD-10-CM | POA: Diagnosis not present

## 2020-02-14 DIAGNOSIS — M48061 Spinal stenosis, lumbar region without neurogenic claudication: Secondary | ICD-10-CM | POA: Diagnosis not present

## 2020-02-16 ENCOUNTER — Telehealth: Payer: Self-pay

## 2020-02-16 NOTE — Telephone Encounter (Signed)
Patient noted weight was 209 lbs today. He takes Bumex 3 mg BID and Metolazone twice a week.He did take extra dose of metolazone yesterday and said he is urinating quite a bit. Denies SOB.

## 2020-02-16 NOTE — Telephone Encounter (Signed)
Pt complaining of legs swelling, fluid filled blisters.  Please call [phone]   Thanks renee

## 2020-02-16 NOTE — Telephone Encounter (Signed)
Agree with extra metolazone, have him update [place] Friday on weight   [name] MD

## 2020-02-16 NOTE — Telephone Encounter (Signed)
I spoke with patient, he will call back with weight on Friday.

## 2020-02-18 ENCOUNTER — Telehealth: Payer: Self-pay

## 2020-02-18 NOTE — Telephone Encounter (Signed)
Pt had 5 lbs weight loss with one extra dose of metolazone   He will continue to weigh daily

## 2020-02-18 NOTE — Telephone Encounter (Signed)
Pt called to report weights as requested by office Wednesday 02-16-20 wgt 209 Friday 02-18-20 wgt this morning 204  Please call if you have further instructions  [phone]  Thanks renee

## 2020-02-21 ENCOUNTER — Ambulatory Visit (INDEPENDENT_AMBULATORY_CARE_PROVIDER_SITE_OTHER): Payer: Medicare Other | Admitting: Family Medicine

## 2020-02-21 ENCOUNTER — Other Ambulatory Visit: Payer: Self-pay | Admitting: Family Medicine

## 2020-02-21 ENCOUNTER — Other Ambulatory Visit: Payer: Self-pay

## 2020-02-21 ENCOUNTER — Encounter: Payer: Self-pay | Admitting: Family Medicine

## 2020-02-21 VITALS — BP 130/74 | HR 88 | Temp 98.1°F | Resp 14 | Ht 59.0 in | Wt 201.0 lb

## 2020-02-21 DIAGNOSIS — I890 Lymphedema, not elsewhere classified: Secondary | ICD-10-CM | POA: Diagnosis not present

## 2020-02-21 DIAGNOSIS — M25561 Pain in right knee: Secondary | ICD-10-CM

## 2020-02-21 DIAGNOSIS — I5042 Chronic combined systolic (congestive) and diastolic (congestive) heart failure: Secondary | ICD-10-CM | POA: Diagnosis not present

## 2020-02-21 DIAGNOSIS — M25562 Pain in left knee: Secondary | ICD-10-CM

## 2020-02-21 DIAGNOSIS — E1149 Type 2 diabetes mellitus with other diabetic neurological complication: Secondary | ICD-10-CM | POA: Diagnosis not present

## 2020-02-21 DIAGNOSIS — L89151 Pressure ulcer of sacral region, stage 1: Secondary | ICD-10-CM

## 2020-02-21 DIAGNOSIS — G8929 Other chronic pain: Secondary | ICD-10-CM

## 2020-02-21 NOTE — Assessment & Plan Note (Signed)
We will continue diuretics as prescribed by cardiology.  His weight has come down nicely. 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

## 2020-02-21 NOTE — Patient Instructions (Signed)
Remove duoderm  In  3 days  Call us if they cant remove unna boots  F/U as previous

## 2020-02-21 NOTE — Progress Notes (Addendum)
Subjective:    Patient ID: Michael [name], male    DOB: 06-Nov-1945, 74 y.o.   MRN: [id number]  Patient presents for BLE Edema/ Blister and Skin Breakdown (buttocks)   Pt here with bilat LE, open sores and weeping  His weight Last Monday was  210lbs . He took an extra metalazone a few days last week weight down to 201lbs . Still on Bumex 45m BID. His Intresto was increased to 49/51 BID per cardiology   His last cardiology visit was 5/11 weight was  203lbs  Legs have not gone down and are weeping , he has used unna boots in the past and had good success    [facility]   He had appt with Orthopedics, scheduled for MRI but doesn't feel like he can do it, also he is not a good surgical candidate   DM- his fasting CBG this AM was  109, yesterday  112, now taking metformin and Lantus  18 units    He also feels soreness in gluteal cleft, like before, no drainage, would like [place] check it    Review Of Systems:  GEN- denies fatigue, fever, weight loss,weakness, recent illness HEENT- denies eye drainage, change in vision, nasal discharge, CVS- denies chest pain, palpitations RESP- denies SOB, cough, wheeze ABD- denies N/V, change in stools, abd pain GU- denies dysuria, hematuria, dribbling, incontinence MSK- +joint pain, muscle aches, injury Neuro- denies headache, dizziness, syncope, seizure activity       Objective:    BP 130/74   Pulse 88   Temp 98.1 F (36.7 C) (Temporal)   Resp 14   Ht 4' 11"  (1.499 m)   Wt 201 lb (91.2 kg)   SpO2 99%   BMI 40.60 kg/m  GEN- NAD, alert and oriented x3 HEENT- PERRL, EOMI, non injected sclera, pink conjunctiva, MMM, oropharynx clear Neck- Supple, no thyromegaly CVS- RRR, no murmur RESP-CTAB ABD-NABS,soft,NT,ND EXT- chronic lymphedema with incrased swelling to below knees, erythema, scattered clear fluid blisters and weeping clear fluid  SKIN- erythema in gluteal cleft, mild TTP  Pulses- Radial,2+       Assessment & Plan:      Problem List Items Addressed This Visit      Unprioritized   Chronic combined systolic and diastolic heart failure [place]    We will continue diuretics as prescribed by cardiology.  His weight has come down nicely. But he has the chronic lymphedema that is still an issue URetail buyerplaced today, elevate feet No sign of superinfection [facility], he would like them to remove, if they are unable will get HH to home  For stage 1 ulcer, duoderm placed to protect skin, care giver can help him remove  DM- blood sugars have improved on higher dose of lantus       Knee pain, chronic    Message sent to ortho to cancel MRI Pt also to call and relay same message       Lymphedema - Primary   Pressure injury of skin, stage 1   Type II diabetes mellitus with neurological manifestations ([facility]      Note: This dictation was prepared with Dragon dictation along with smaller phrase technology. Any transcriptional errors that result from this process are unintentional.

## 2020-02-21 NOTE — Assessment & Plan Note (Signed)
Message sent to ortho to cancel MRI Pt also to call and relay same message

## 2020-02-23 ENCOUNTER — Telehealth: Payer: Self-pay | Admitting: *Deleted

## 2020-02-23 NOTE — Telephone Encounter (Signed)
Call placed to patient.   States that he feels that legs look worse than when they were wrapped. Reports that some type of bandage was applied at [place], but he is unsure what type of dressing it is.   Appointment scheduled for Friday to follow up.

## 2020-02-23 NOTE — Telephone Encounter (Signed)
Noted below, pt declined appt for Thursday

## 2020-02-23 NOTE — Telephone Encounter (Signed)
Received call from patient.   Reports that [facility] with [facility] was [facility] on [facility]/23/2020. States that MD unwrapped legs and recommended that patient F/U with PCP ASAP. States that MD did order [facility] for wound care, but is unsure how long it will take them to staff.   Reports that he has appointment with podiatry on 02/24/2020.  Call placed to patient to inquire. [place].

## 2020-02-25 ENCOUNTER — Ambulatory Visit (INDEPENDENT_AMBULATORY_CARE_PROVIDER_SITE_OTHER): Payer: Medicare Other | Admitting: Family Medicine

## 2020-02-25 ENCOUNTER — Other Ambulatory Visit: Payer: Self-pay

## 2020-02-25 ENCOUNTER — Encounter: Payer: Self-pay | Admitting: Family Medicine

## 2020-02-25 VITALS — BP 122/68 | HR 70 | Temp 98.5°F | Resp 14

## 2020-02-25 DIAGNOSIS — L89151 Pressure ulcer of sacral region, stage 1: Secondary | ICD-10-CM | POA: Diagnosis not present

## 2020-02-25 DIAGNOSIS — I890 Lymphedema, not elsewhere classified: Secondary | ICD-10-CM

## 2020-02-25 NOTE — Progress Notes (Signed)
   Subjective:    Patient ID: Michael [name], male    DOB: Feb 18, 1946, 74 y.o.   MRN: [id number]  Patient presents for BLE Lymphedema  Patient here for follow-up on bilateral lymphedema.  He was placed into [employer]  on Monday.  He was seen by [employer] PCP he states that they were not aware what type of wraps they were and there was some confusion but they went ahead and cut him off.  They have been placed on Librax on a day when you get him set up with home health via the [place].  Someone did reach out to him yesterday states an agency will be contacting him for wound care.  The swelling has gone down significantly in his right leg he no longer has any sores that are draining.  He has 1 tiny open spot on his left leg but the other source is also going down but he still has a good amount of swelling in his left leg.  He would like to have that and have these replaced as they help significantly with discomfort.  His weight at home is  201lbs   1st degree sacral decub- The sacral area is much better he is not having any  discomfort.  He has been applying the Desitin   Review Of Systems:  GEN- denies fatigue, fever, weight loss,weakness, recent illness HEENT- denies eye drainage, change in vision, nasal discharge, CVS- denies chest pain, palpitations RESP- denies SOB, cough, wheeze ABD- denies N/V, change in stools, abd pain GU- denies dysuria, hematuria, dribbling, incontinence MSK- +joint pain, muscle aches, injury Neuro- denies headache, dizziness, syncope, seizure activity       Objective:    BP 122/68   Pulse 70   Temp 98.5 F (36.9 C) (Temporal)   Resp 14   SpO2 98%  GEN- NAD, alert and oriented x3,itting in wheelchair  CVS- RRR, no murmur RESP-CTAB EXT- chronic lymphedema with incrased swelling L > below knees, erythema, no blisters or weeping, 1 small open scab, NT SKIN-  Mild erythema in gluteal cleft, NT, no ulceration  Pulses- Radial,2+, DP not lapated            Assessment & Plan:      Problem List Items Addressed This Visit      Unprioritized   Lymphedema    Chronic lymphedema with current exacerbations of increased fluid retention.  His right leg has gone Down significantly.  He still has good amount of fluid on the left leg he is not having any weeping blisters at this time.  We will go ahead and place Unna boots again a little less tight. They will  be removed on Tuesday either by home health or my office.  Taking his diuretics per cardiology recommendation      Pressure injury of skin, stage 1 - Primary    Much improved Skin in tact Continue topical desitin         Note: This dictation was prepared with Dragon dictation along with smaller phrase technology. Any transcriptional errors that result from this process are unintentional.

## 2020-02-25 NOTE — Assessment & Plan Note (Signed)
Much improved Skin in tact Continue topical desitin

## 2020-02-25 NOTE — Assessment & Plan Note (Signed)
Chronic lymphedema with current exacerbations of increased fluid retention.  His right leg has gone Down significantly.  He still has good amount of fluid on the left leg he is not having any weeping blisters at this time.  We will go ahead and place Unna boots again a little less tight. They will  be removed on Tuesday either by home health or my office.  Taking his diuretics per cardiology recommendation

## 2020-02-28 DIAGNOSIS — I890 Lymphedema, not elsewhere classified: Secondary | ICD-10-CM | POA: Diagnosis not present

## 2020-02-28 DIAGNOSIS — I5032 Chronic diastolic (congestive) heart failure: Secondary | ICD-10-CM | POA: Diagnosis not present

## 2020-02-28 DIAGNOSIS — R531 Weakness: Secondary | ICD-10-CM | POA: Diagnosis not present

## 2020-02-28 DIAGNOSIS — R262 Difficulty in walking, not elsewhere classified: Secondary | ICD-10-CM | POA: Diagnosis not present

## 2020-03-01 ENCOUNTER — Telehealth: Payer: Self-pay | Admitting: *Deleted

## 2020-03-01 NOTE — Telephone Encounter (Signed)
Received call from patient.   Requested prescription for Lift Chair  Call placed to patient. Advised that prescription can be written for lift chair, but medicare does not cover entirely of chair. Advised that insurance only covers lift apparatus with prescription.   States that he is aware and will pay out of pocket.   Prescription printed for patient.

## 2020-03-01 NOTE — Telephone Encounter (Signed)
Script signed for DME

## 2020-03-02 ENCOUNTER — Telehealth: Payer: Medicare Other | Admitting: Cardiology

## 2020-03-03 DIAGNOSIS — G894 Chronic pain syndrome: Secondary | ICD-10-CM | POA: Diagnosis not present

## 2020-03-03 DIAGNOSIS — M25561 Pain in right knee: Secondary | ICD-10-CM | POA: Diagnosis not present

## 2020-03-03 DIAGNOSIS — M79606 Pain in leg, unspecified: Secondary | ICD-10-CM | POA: Diagnosis not present

## 2020-03-03 DIAGNOSIS — Z79899 Other long term (current) drug therapy: Secondary | ICD-10-CM | POA: Diagnosis not present

## 2020-03-06 ENCOUNTER — Other Ambulatory Visit: Payer: Medicare Other

## 2020-03-09 ENCOUNTER — Ambulatory Visit: Payer: Medicare Other | Admitting: Orthopaedic Surgery

## 2020-03-15 ENCOUNTER — Telehealth: Payer: Medicare Other

## 2020-03-16 DIAGNOSIS — R531 Weakness: Secondary | ICD-10-CM | POA: Diagnosis not present

## 2020-03-16 DIAGNOSIS — M48061 Spinal stenosis, lumbar region without neurogenic claudication: Secondary | ICD-10-CM | POA: Diagnosis not present

## 2020-03-16 DIAGNOSIS — I5042 Chronic combined systolic (congestive) and diastolic (congestive) heart failure: Secondary | ICD-10-CM | POA: Diagnosis not present

## 2020-03-29 ENCOUNTER — Telehealth: Payer: Self-pay | Admitting: *Deleted

## 2020-03-29 NOTE — Telephone Encounter (Signed)
I dont have any other forms  I completed the previous one they sent

## 2020-03-29 NOTE — Telephone Encounter (Signed)
Received call from patient.   Inquired about status of form for lift chair. Reports that he cannot get lift chair until form has been completed.   MD please advise.

## 2020-03-29 NOTE — Telephone Encounter (Signed)
Call placed to patient and patient made aware.   Will wait to see if form is scanned to chart.

## 2020-04-07 ENCOUNTER — Emergency Department (HOSPITAL_COMMUNITY): Payer: Medicare Other

## 2020-04-07 ENCOUNTER — Encounter (HOSPITAL_COMMUNITY): Payer: Self-pay

## 2020-04-07 ENCOUNTER — Emergency Department (HOSPITAL_COMMUNITY)
Admission: EM | Admit: 2020-04-07 | Discharge: 2020-04-07 | Disposition: A | Discharge status: Home / Self Care | Payer: Medicare Other | Attending: Emergency Medicine | Admitting: Emergency Medicine

## 2020-04-07 DIAGNOSIS — S79911A Unspecified injury of right hip, initial encounter: Secondary | ICD-10-CM | POA: Diagnosis not present

## 2020-04-07 DIAGNOSIS — M25551 Pain in right hip: Secondary | ICD-10-CM | POA: Diagnosis not present

## 2020-04-07 DIAGNOSIS — Y9289 Other specified places as the place of occurrence of the external cause: Secondary | ICD-10-CM | POA: Insufficient documentation

## 2020-04-07 DIAGNOSIS — I499 Cardiac arrhythmia, unspecified: Secondary | ICD-10-CM | POA: Diagnosis not present

## 2020-04-07 DIAGNOSIS — Y9389 Activity, other specified: Secondary | ICD-10-CM | POA: Diagnosis not present

## 2020-04-07 DIAGNOSIS — Y999 Unspecified external cause status: Secondary | ICD-10-CM | POA: Diagnosis not present

## 2020-04-07 DIAGNOSIS — E039 Hypothyroidism, unspecified: Secondary | ICD-10-CM | POA: Diagnosis not present

## 2020-04-07 DIAGNOSIS — W19XXXA Unspecified fall, initial encounter: Secondary | ICD-10-CM

## 2020-04-07 DIAGNOSIS — J449 Chronic obstructive pulmonary disease, unspecified: Secondary | ICD-10-CM | POA: Insufficient documentation

## 2020-04-07 DIAGNOSIS — S8012XA Contusion of left lower leg, initial encounter: Secondary | ICD-10-CM | POA: Insufficient documentation

## 2020-04-07 DIAGNOSIS — W050XXA Fall from non-moving wheelchair, initial encounter: Secondary | ICD-10-CM | POA: Insufficient documentation

## 2020-04-07 DIAGNOSIS — R6889 Other general symptoms and signs: Secondary | ICD-10-CM | POA: Diagnosis not present

## 2020-04-07 DIAGNOSIS — S79921A Unspecified injury of right thigh, initial encounter: Secondary | ICD-10-CM | POA: Diagnosis not present

## 2020-04-07 DIAGNOSIS — M545 Low back pain: Secondary | ICD-10-CM | POA: Insufficient documentation

## 2020-04-07 DIAGNOSIS — M25851 Other specified joint disorders, right hip: Secondary | ICD-10-CM | POA: Diagnosis not present

## 2020-04-07 DIAGNOSIS — Z7984 Long term (current) use of oral hypoglycemic drugs: Secondary | ICD-10-CM | POA: Insufficient documentation

## 2020-04-07 DIAGNOSIS — Z87891 Personal history of nicotine dependence: Secondary | ICD-10-CM | POA: Insufficient documentation

## 2020-04-07 DIAGNOSIS — Z79899 Other long term (current) drug therapy: Secondary | ICD-10-CM | POA: Insufficient documentation

## 2020-04-07 DIAGNOSIS — S8001XA Contusion of right knee, initial encounter: Secondary | ICD-10-CM | POA: Diagnosis not present

## 2020-04-07 DIAGNOSIS — M25572 Pain in left ankle and joints of left foot: Secondary | ICD-10-CM | POA: Diagnosis not present

## 2020-04-07 DIAGNOSIS — T07XXXA Unspecified multiple injuries, initial encounter: Secondary | ICD-10-CM

## 2020-04-07 DIAGNOSIS — S7011XA Contusion of right thigh, initial encounter: Secondary | ICD-10-CM | POA: Diagnosis not present

## 2020-04-07 DIAGNOSIS — I110 Hypertensive heart disease with heart failure: Secondary | ICD-10-CM | POA: Insufficient documentation

## 2020-04-07 DIAGNOSIS — S8991XA Unspecified injury of right lower leg, initial encounter: Secondary | ICD-10-CM | POA: Diagnosis not present

## 2020-04-07 DIAGNOSIS — I5042 Chronic combined systolic (congestive) and diastolic (congestive) heart failure: Secondary | ICD-10-CM | POA: Insufficient documentation

## 2020-04-07 DIAGNOSIS — E1140 Type 2 diabetes mellitus with diabetic neuropathy, unspecified: Secondary | ICD-10-CM | POA: Diagnosis not present

## 2020-04-07 DIAGNOSIS — S8011XA Contusion of right lower leg, initial encounter: Secondary | ICD-10-CM | POA: Diagnosis not present

## 2020-04-07 DIAGNOSIS — R000 Tachycardia, unspecified: Secondary | ICD-10-CM | POA: Diagnosis not present

## 2020-04-07 DIAGNOSIS — Z743 Need for continuous supervision: Secondary | ICD-10-CM | POA: Diagnosis not present

## 2020-04-07 MED ORDER — HYDROMORPHONE HCL 1 MG/ML IJ SOLN
1.0000 mg | Freq: Once | INTRAMUSCULAR | Status: AC
  Administered 2020-04-07: 1 mg via INTRAMUSCULAR
  Filled 2020-04-07: qty 1

## 2020-04-07 NOTE — ED Notes (Signed)
[facility] called and informed of need for transportation home

## 2020-04-07 NOTE — ED Notes (Signed)
Pt states both his feet feel like they are asleep; EDP made aware

## 2020-04-07 NOTE — ED Triage Notes (Signed)
Pt fell this morning and now right hip pain. Pt was trying to transfer from recliner to wheelchair. Wheelchair wasn't locked and he fell. He is unable to straighten right leg. Pt is immobile.

## 2020-04-07 NOTE — ED Provider Notes (Signed)
[facility] Provider Note   CSN: [id number] Arrival date & time: 04/07/20  6440     History Chief Complaint  Patient presents with  . Fall    Michael [name] is a 74 y.o. male.  HPI   74 year old male presenting after fall.  He is transitioning from the couch to his wheelchair.  He did not lock his wheelchair as he typically does.  It slid away from him and he fell onto his right side.  He has had severe persistent right knee/distal thigh pain since that time.  Denies any other acute pain.  He is not anticoagulated.  He does not think he hit his head.  Denies any acute headache or neck pain.  Past Medical History:  Diagnosis Date  . Arthritis   . Congestive heart disease ([place]) 11/2015  . COPD (chronic obstructive pulmonary disease) ([place])   . DDD (degenerative disc disease), lumbosacral   . Depression   . Diabetic neuropathy ([place])   . Diastolic dysfunction   . Essential hypertension   . GERD (gastroesophageal reflux disease)   . Hyperlipidemia   . Iron deficiency anemia   . Left knee DJD   . Lumbar spinal stenosis   . Lymphedema 11/2015   BOTH LEGS  . NASH (nonalcoholic steatohepatitis)   . Peripheral edema   . Peripheral edema 10/05/2015  . Type 2 diabetes mellitus [facility])     Patient Active Problem List   Diagnosis Date Noted  . Pressure injury of skin, stage 1 12/07/2019  . Demand ischemia ([place]) 09/23/2019  . Chronic insomnia 11/18/2018  . Chronic pain syndrome 05/06/2018  . Depression, major, single episode, mild ([place]) 06/20/2017  . DNR (do not resuscitate) 06/20/2017  . Chronic combined systolic and diastolic heart failure ([place]) 10/23/2016  . Generalized weakness 08/01/2016  . Intertriginous candidiasis 12/23/2014  . Hypothyroidism 11/30/2014  . Sinus pause 07/21/2014  . Liver fibrosis 06/29/2014  . IDA (iron deficiency anemia) 06/29/2014  . Iron deficiency anemia 05/31/2014  . Hepatomegaly 05/25/2014  . Difficulty walking 05/03/2014  .  Knee pain, chronic 05/03/2014  . Dermatitis 04/12/2014  . Spinal stenosis of lumbar region 07/06/2013  . Carpal tunnel syndrome 04/13/2013  . Lymphedema 04/13/2013  . DDD (degenerative disc disease), lumbosacral 04/13/2013  . Peripheral neuropathy 01/05/2013  . S/P arthroscopy of left knee 11/09/2012  . Meniscus tear 10/07/2012  . NASH (nonalcoholic steatohepatitis) 05/22/2007  . Type II diabetes mellitus with neurological manifestations ([place]) 05/20/2007  . Hyperlipidemia 10/15/2006  . OBESITY, MORBID 10/15/2006  . Essential hypertension 10/15/2006  . GERD 10/15/2006    Past Surgical History:  Procedure Laterality Date  . BIOPSY N/A 07/21/2014   Procedure: BIOPSY;  Surgeon: Daneil Dolin, MD;  Location: [facility];  Service: Endoscopy;  Laterality: N/A;  . CATARACT EXTRACTION W/PHACO Right 07/08/2016   Procedure: CATARACT EXTRACTION PHACO AND INTRAOCULAR LENS PLACEMENT RIGHT EYE;  Surgeon: Tonny Branch, MD;  Location: [facility];  Service: Ophthalmology;  Laterality: Right;  CDE: 9.01  . CATARACT EXTRACTION W/PHACO Left 07/22/2016   Procedure: CATARACT EXTRACTION PHACO AND INTRAOCULAR LENS PLACEMENT LEFT EYE CDE=10.69;  Surgeon: Tonny Branch, MD;  Location: [facility];  Service: Ophthalmology;  Laterality: Left;  left -   . COLONOSCOPY  2011   [facility]: normal colon, normal distal ileum  . COLONOSCOPY WITH PROPOFOL N/A 07/21/2014   Procedure: ATTEMPTED COLONOSCOPY WITH PROPOFOL-HAD TO STOP DUE TO BRADYCARDIA;  Surgeon: Daneil Dolin, MD;  Location: [facility];  Service: Endoscopy;  Laterality: N/A;  . EGD with enteroscopy  2011   [facility]: normal esophagus and stomach. Normal duodenum, jejunum. No evidence of AVMs.   . ESOPHAGOGASTRODUODENOSCOPY (EGD) WITH PROPOFOL N/A 07/21/2014   Procedure: ESOPHAGOGASTRODUODENOSCOPY (EGD) WITH PROPOFOL;  Surgeon: Daneil Dolin, MD;  Location: [facility];  Service: Endoscopy;  Laterality: N/A;  . HERNIA REPAIR    . KNEE ARTHROSCOPY WITH MEDIAL MENISECTOMY Left  11/06/2012   Procedure: KNEE ARTHROSCOPY WITH MEDIAL MENISECTOMY;  Surgeon: Carole Civil, MD;  Location: [facility];  Service: Orthopedics;  Laterality: Left;  . Lipoma removal     Stomach  . LUMBAR LAMINECTOMY/DECOMPRESSION MICRODISCECTOMY Left 08/30/2013   Procedure: LUMBAR LAMINECTOMY/DECOMPRESSION MICRODISCECTOMY LEFT  LUMBAR TWO THREE;  Surgeon: Otilio Connors, MD;  Location: [facility];  Service: Neurosurgery;  Laterality: Left;  . RIGHT/LEFT HEART CATH AND CORONARY ANGIOGRAPHY N/A 09/22/2019   Procedure: RIGHT/LEFT HEART CATH AND CORONARY ANGIOGRAPHY;  Surgeon: Martinique, Peter M, MD;  Location: [facility] CV [facility];  Service: Cardiovascular;  Laterality: N/A;  . SHOULDER SURGERY     Rght-rotator cuff  . TOOTH EXTRACTION         Family History  Problem Relation Age of Onset  . Heart disease Mother   . Hyperlipidemia Mother   . Hypertension Mother   . Depression Mother   . Diabetes Mother   . Rectal cancer Mother   . Cancer Mother   . Heart disease Father   . Hypertension Father   . Hyperlipidemia Father   . Diabetes Father   . Cancer Father   . Heart disease Sister   . Hyperlipidemia Sister   . Hypertension Sister   . Diabetes Sister   . Diabetes Brother   . Heart disease Sister   . Hyperlipidemia Sister   . Hypertension Sister   . Heart disease Sister   . Hyperlipidemia Sister   . Hypertension Sister   . Diabetes Sister   . Diabetes Brother     Social History   Tobacco Use  . Smoking status: Former Smoker    Packs/day: 2.50    Years: 44.00    Pack years: 110.00    Types: Cigarettes    Start date: 05/07/1955    Quit date: 10/01/1999    Years since quitting: 20.5  . Smokeless tobacco: Never Used  Vaping Use  . Vaping Use: Never used  Substance Use Topics  . Alcohol use: No    Alcohol/week: 0.0 standard drinks  . Drug use: No    Home Medications Prior to Admission medications   Medication Sig Start Date End Date Taking? Authorizing Provider  aspirin EC  81 MG tablet Take 81 mg by mouth daily.    [provider]  bumetanide (BUMEX) 2 MG tablet Take 1.5 tablets (3 mg total) by mouth 2 (two) times daily. 11/01/19   [name], MD  carvedilol (COREG) 12.5 MG tablet Take 1 tablet (12.5 mg total) by mouth 2 (two) times daily. 12/14/19 03/13/20  [name], MD  diclofenac sodium (VOLTAREN) 1 % GEL Apply 4 g topically 4 (four) times daily as needed (pain). 03/06/17   [name], MD  Ferrous Sulfate (IRON) 325 (65 FE) MG TABS Take 1 tablet by mouth daily.     [provider]  gabapentin (NEURONTIN) 400 MG capsule Take 1 capsule (400 mg total) by mouth 3 (three) times daily. 03/04/18   [name], MD  Insulin Glargine (LANTUS SOLOSTAR) 100 UNIT/ML Solostar Pen Inject  5 Units into the skin daily at 10 pm. 03/06/18   [name], MD  levothyroxine (SYNTHROID, LEVOTHROID) 100 MCG tablet TAKE 1 TABLET BY MOUTH DAILY BEFORE BREAKFAST. Patient taking differently: Take 100 mcg by mouth daily before breakfast.  07/06/18   Susy Frizzle, MD  magnesium oxide (MAG-OX) 400 MG tablet Take 1 tablet (400 mg total) by mouth 2 (two) times daily. 11/19/17   Imogene Burn, PA-C  metFORMIN (GLUCOPHAGE) 1000 MG tablet TAKE ONE TABLET BY MOUTH TWICE DAILY WITH A MEAL. 09/24/19   Sande Rives E, PA-C  metolazone (ZAROXOLYN) 2.5 MG tablet Two Times Weekly on Monday and Thursday 02/08/20   [name], MD  nystatin cream (MYCOSTATIN) APPLY TO AFFECTED AREA TWICE DAILY. 03/04/18   [name], MD  oxyCODONE-acetaminophen (PERCOCET) 7.5-325 MG tablet TAKE 1 TABLET BY MOUTH EVERY 6 HOURS AS NEEDED FOR SEVERE PAIN. Patient taking differently: Take 1 tablet by mouth every 6 (six) hours as needed for severe pain.  06/30/18   Susy Frizzle, MD  pantoprazole (PROTONIX) 40 MG tablet Take 1 tablet (40 mg total) by mouth daily. 02/08/20   [name], MD  potassium chloride (K-DUR) 10 MEQ tablet Take 1 tablet (10 mEq total)  by mouth 2 (two) times daily. 11/19/17   Imogene Burn, PA-C  pravastatin (PRAVACHOL) 40 MG tablet TAKE ONE TABLET BY MOUTH DAILY. Patient taking differently: TAKE ONE TABLET BY MOUTH DAILY IN THE EVENING 05/21/16   Vinton, Modena Nunnery, MD  sacubitril-valsartan (ENTRESTO) 49-51 MG Take 1 tablet by mouth 2 (two) times daily. 02/08/20   [name], MD  traZODone (DESYREL) 100 MG tablet TAKE 1 TABLET BY MOUTH AT BEDTIME AS NEEDED FOR SLEEP. 11/18/18   [name], MD  triamcinolone cream (KENALOG) 0.1 % Apply 1 application topically 2 (two) times daily. To arms/hand 12/07/19   [name], MD    Allergies    Patient has no known allergies.  Review of Systems   Review of Systems All systems reviewed and negative, other than as noted in HPI.  Physical Exam Updated Vital Signs BP (!) 168/90 (BP Location: Left Arm)   Pulse (!) 121   Temp 98.8 F (37.1 C) (Oral)   Resp 14   Ht 4' 11"  (1.499 m)   Wt 91.2 kg   SpO2 100%   BMI 40.61 kg/m   Physical Exam Vitals and nursing note reviewed.  Constitutional:      General: He is not in acute distress.    Appearance: He is well-developed.  HENT:     Head: Normocephalic and atraumatic.  Eyes:     General:        Right eye: No discharge.        Left eye: No discharge.     Conjunctiva/sclera: Conjunctivae normal.  Cardiovascular:     Rate and Rhythm: Regular rhythm. Tachycardia present.     Heart sounds: Normal heart sounds. No murmur heard.  No friction rub. No gallop.   Pulmonary:     Effort: Pulmonary effort is normal. No respiratory distress.     Breath sounds: Normal breath sounds.  Abdominal:     General: There is no distension.     Palpations: Abdomen is soft.     Tenderness: There is no abdominal tenderness.  Musculoskeletal:        General: No tenderness.     Cervical back: Neck supple.     Right lower leg: Edema present.  Left lower leg: Edema present.     Comments: Severe, symmetric lower extremity  edema.  Venous stasis changes of bilateral lower extremities.  Tenderness and pain with attempted range of motion of the right knee.  Cannot actively range it secondary to pain.  Small effusion.  Pain with range of motion of the right hip, particularly laterally.  Skin:    General: Skin is warm and dry.  Neurological:     Mental Status: He is alert.  Psychiatric:        Behavior: Behavior normal.        Thought Content: Thought content normal.     ED Results / Procedures / Treatments   Labs (all labs ordered are listed, but only abnormal results are displayed) Labs Reviewed - No data to display  EKG EKG Interpretation  Date/Time:  Friday April 07 2020 08:40:00 EDT Ventricular Rate:  119 PR Interval:    QRS Duration: 94 QT Interval:  320 QTC Calculation: 451 R Axis:   98 Text Interpretation: Sinus tachycardia Anterior infarct, old Borderline repolarization abnormality Baseline wander in lead(s) V1 V5 Confirmed by Virgel Manifold [id number]) on 04/07/2020 8:50:07 AM   Radiology No results found.  Procedures Procedures (including critical care time)  Medications Ordered in ED Medications  HYDROmorphone (DILAUDID) injection 1 mg (1 mg Intramuscular Given 04/07/20 4503)    ED Course  I have reviewed the triage vital signs and the nursing notes.  Pertinent labs & imaging results that were available during my care of the patient were reviewed by me and considered in my medical decision making (see chart for details).    MDM Rules/Calculators/A&P                          74 year old male with right lower extremity pain after mechanical fall.  Since being the emergency room he began complaining of some pain in his lower back and numbness in bilateral feet.  Seems to be neurovascular intact on examination.  Will obtain some lumbar films as well.  Imaging negative for acute abnormality.  He can continue to take his regularly prescribed pain medicine as needed.  Outpatient follow-up as  needed.  Final Clinical Impression(s) / ED Diagnoses Final diagnoses:  Fall, initial encounter  Multiple contusions    Rx / DC Orders ED Discharge Orders    None       Virgel Manifold, MD 04/07/20 1020

## 2020-04-07 NOTE — Telephone Encounter (Signed)
Received another form to complete.

## 2020-04-07 NOTE — ED Notes (Signed)
Pt assisted out via wheelchair and into the car

## 2020-04-10 ENCOUNTER — Ambulatory Visit: Payer: Medicare Other | Admitting: Family Medicine

## 2020-04-10 ENCOUNTER — Ambulatory Visit (INDEPENDENT_AMBULATORY_CARE_PROVIDER_SITE_OTHER): Payer: Medicare Other | Admitting: Family Medicine

## 2020-04-10 ENCOUNTER — Encounter: Payer: Self-pay | Admitting: Family Medicine

## 2020-04-10 ENCOUNTER — Other Ambulatory Visit: Payer: Self-pay

## 2020-04-10 VITALS — BP 140/82 | HR 78 | Temp 98.1°F | Resp 14

## 2020-04-10 DIAGNOSIS — E782 Mixed hyperlipidemia: Secondary | ICD-10-CM

## 2020-04-10 DIAGNOSIS — E1149 Type 2 diabetes mellitus with other diabetic neurological complication: Secondary | ICD-10-CM

## 2020-04-10 DIAGNOSIS — I10 Essential (primary) hypertension: Secondary | ICD-10-CM

## 2020-04-10 DIAGNOSIS — I890 Lymphedema, not elsewhere classified: Secondary | ICD-10-CM

## 2020-04-10 DIAGNOSIS — E038 Other specified hypothyroidism: Secondary | ICD-10-CM

## 2020-04-10 DIAGNOSIS — W19XXXD Unspecified fall, subsequent encounter: Secondary | ICD-10-CM

## 2020-04-10 DIAGNOSIS — G44319 Acute post-traumatic headache, not intractable: Secondary | ICD-10-CM

## 2020-04-10 DIAGNOSIS — K7581 Nonalcoholic steatohepatitis (NASH): Secondary | ICD-10-CM

## 2020-04-10 DIAGNOSIS — E669 Obesity, unspecified: Secondary | ICD-10-CM

## 2020-04-10 NOTE — Assessment & Plan Note (Signed)
Mildly elevated No changes to meds Take diuretic once he gets home

## 2020-04-10 NOTE — Assessment & Plan Note (Addendum)
A1C goal less than 7% Worsened at last check Continue lantus 18 units, MTF CBG have improved

## 2020-04-10 NOTE — Assessment & Plan Note (Signed)
Continue unna boots prn with [facility] nurse

## 2020-04-10 NOTE — Progress Notes (Signed)
Subjective:    Patient ID: Michael [name], male    DOB: Aug 18, 1946, 74 y.o.   MRN: [id number]  Patient presents for Follow-up (is fasting) and ER F/U (S/P fall- leg pain, rib pain)  Pt here to f/u chronic medical problems    Seen in ER last Friday, secondary to fall   He doesn't remember how he fell, he is not sure , but has had a headache since then,, he has had bruising on his arms/ arms, not sure where that came from                he is taking asa 12m once a day           Xrays did not show any fracture    Mild depression/difficulty sleepingcontinue trazodone 1063m Chronic pain syndrome-  He is not a surgical candidate. he is taking pain meds Oxycodone BID , he also has a prn hydrocodne    Chronic lymphedema-compression hose[place] [facility] [place], he has seen significant improvement in his legs    DMtype 2 with neuropathy, last A1C  8% in March  . Taking  lantus at 18 units and metformin 100068mID, no hypoglycemia episodes   CBG range this AM, was  114 ,  98-130's, 167 ( highest)   GERD- he has been on protonix changed at [facility] to formulary  Review Of Systems:  GEN- denies fatigue, fever, weight loss,weakness, recent illness HEENT- denies eye drainage, change in vision, nasal discharge, CVS- denies chest pain, palpitations RESP- denies SOB, cough, wheeze ABD- denies N/V, change in stools, abd pain GU- denies dysuria, hematuria, dribbling, incontinence MSK- +joint pain, muscle aches, injury Neuro- denies headache, dizziness, syncope, seizure activity       Objective:    BP 140/82   Pulse 78   Temp 98.1 F (36.7 C) (Temporal)   Resp 14   SpO2 98%   GEN- NAD, alert and oriented x3,sitting in wheelchair  HEENT- PERRL, small knot left forehead NT, no fluctance, EOMI, MMM CVS- RRR, no murmur RESP-CTAB ABD-NABS,soft,NT,ND  EXT- chronic lymphedema with incrased swelling L > below knees, chronic erythema- venous statis changes MSK-  Decreased ROM knees, hips SKIN-  Bruising bilat forearms, left hand, small skin tear left hand with scab  Pulses- Radial,2+, DP not palpated  NEURO-CNII-XII grossly in tact, normal speech        Assessment & Plan:      Problem List Items Addressed This Visit      Unprioritized   Class 3 obesity   Essential hypertension - Primary    Mildly elevated No changes to meds Take diuretic once he gets home      Relevant Orders   CBC with Differential/Platelet   Comprehensive metabolic panel   Hyperlipidemia   Relevant Orders   Lipid panel   Hypothyroidism   Relevant Orders   TSH   Lymphedema    Continue unna boots prn with HH nurse      NASH (nonalcoholic steatohepatitis)    Recheck LFT       Type II diabetes mellitus with neurological manifestations (HCC)    A1C goal less than 7% Worsened at last check Continue lantus 18 units, MTF CBG have improved      Relevant Orders   Hemoglobin A1c   Lipid panel    Other Visit Diagnoses    Fall, subsequent encounter       Relevant Orders   CT Head  Wo Contrast   Acute post-traumatic headache, not intractable       Besides HA no focal neurological Deficit, obtain CT head r/o bleed pt does not recall events of fall. He was unable to go to today,w ill have in AM   Relevant Orders   CT Head Wo Contrast      Note: This dictation was prepared with Dragon dictation along with smaller phrase technology. Any transcriptional errors that result from this process are unintentional.

## 2020-04-10 NOTE — Assessment & Plan Note (Signed)
Recheck LFT

## 2020-04-10 NOTE — Patient Instructions (Addendum)
F/U 4 months  CT scan to be done at [facility] We will call with results Please give pt his form at front desk

## 2020-04-11 ENCOUNTER — Ambulatory Visit (HOSPITAL_COMMUNITY)
Admission: RE | Admit: 2020-04-11 | Discharge: 2020-04-11 | Disposition: A | Discharge status: Home / Self Care | Payer: Medicare Other | Source: Ambulatory Visit | Attending: Family Medicine | Admitting: Family Medicine

## 2020-04-11 DIAGNOSIS — W19XXXD Unspecified fall, subsequent encounter: Secondary | ICD-10-CM | POA: Insufficient documentation

## 2020-04-11 DIAGNOSIS — S0990XA Unspecified injury of head, initial encounter: Secondary | ICD-10-CM | POA: Diagnosis not present

## 2020-04-11 DIAGNOSIS — R519 Headache, unspecified: Secondary | ICD-10-CM | POA: Diagnosis not present

## 2020-04-11 DIAGNOSIS — G44319 Acute post-traumatic headache, not intractable: Secondary | ICD-10-CM | POA: Diagnosis not present

## 2020-04-11 LAB — CBC WITH DIFFERENTIAL/PLATELET
Absolute Monocytes: 365 cells/uL (ref 200–950)
Basophils Absolute: 59 cells/uL (ref 0–200)
Basophils Relative: 1.4 %
Eosinophils Absolute: 113 cells/uL (ref 15–500)
Eosinophils Relative: 2.7 %
HCT: 36.8 % — ABNORMAL LOW (ref 38.5–50.0)
Hemoglobin: 12.1 g/dL — ABNORMAL LOW (ref 13.2–17.1)
Lymphs Abs: 1205 cells/uL (ref 850–3900)
MCH: 27.8 pg (ref 27.0–33.0)
MCHC: 32.9 g/dL (ref 32.0–36.0)
MCV: 84.4 fL (ref 80.0–100.0)
MPV: 9.9 fL (ref 7.5–12.5)
Monocytes Relative: 8.7 %
Neutro Abs: 2457 cells/uL (ref 1500–7800)
Neutrophils Relative %: 58.5 %
Platelets: 186 10*3/uL (ref 140–400)
RBC: 4.36 10*6/uL (ref 4.20–5.80)
RDW: 14.5 % (ref 11.0–15.0)
Total Lymphocyte: 28.7 %
WBC: 4.2 10*3/uL (ref 3.8–10.8)

## 2020-04-11 LAB — LIPID PANEL
Cholesterol: 148 mg/dL (ref <200)
HDL: 42 mg/dL (ref >=40)
LDL Cholesterol (Calc): 82 mg/dL (calc)
Non-HDL Cholesterol (Calc): 106 mg/dL (calc) (ref <130)
Total CHOL/HDL Ratio: 3.5 (calc) (ref <5.0)
Triglycerides: 137 mg/dL (ref <150)

## 2020-04-11 LAB — HEMOGLOBIN A1C
Hgb A1c MFr Bld: 6.9 % of total Hgb — ABNORMAL HIGH (ref <5.7)
Mean Plasma Glucose: 151 (calc)
eAG (mmol/L): 8.4 (calc)

## 2020-04-11 LAB — COMPREHENSIVE METABOLIC PANEL
AG Ratio: 1.9 (calc) (ref 1.0–2.5)
ALT: 21 U/L (ref 9–46)
AST: 21 U/L (ref 10–35)
Albumin: 4 g/dL (ref 3.6–5.1)
Alkaline phosphatase (APISO): 51 U/L (ref 35–144)
BUN: 8 mg/dL (ref 7–25)
CO2: 25 mmol/L (ref 20–32)
Calcium: 9.3 mg/dL (ref 8.6–10.3)
Chloride: 100 mmol/L (ref 98–110)
Creat: 0.72 mg/dL (ref 0.70–1.18)
Globulin: 2.1 g/dL (calc) (ref 1.9–3.7)
Glucose, Bld: 106 mg/dL — ABNORMAL HIGH (ref 65–99)
Potassium: 4.9 mmol/L (ref 3.5–5.3)
Sodium: 137 mmol/L (ref 135–146)
Total Bilirubin: 0.5 mg/dL (ref 0.2–1.2)
Total Protein: 6.1 g/dL (ref 6.1–8.1)

## 2020-04-11 LAB — TSH: TSH: 5.8 mIU/L — ABNORMAL HIGH (ref 0.40–4.50)

## 2020-04-13 ENCOUNTER — Other Ambulatory Visit: Payer: Self-pay | Admitting: *Deleted

## 2020-04-13 ENCOUNTER — Encounter: Payer: Self-pay | Admitting: *Deleted

## 2020-04-13 DIAGNOSIS — E038 Other specified hypothyroidism: Secondary | ICD-10-CM

## 2020-04-15 DIAGNOSIS — M48061 Spinal stenosis, lumbar region without neurogenic claudication: Secondary | ICD-10-CM | POA: Diagnosis not present

## 2020-04-15 DIAGNOSIS — I5042 Chronic combined systolic (congestive) and diastolic (congestive) heart failure: Secondary | ICD-10-CM | POA: Diagnosis not present

## 2020-04-15 DIAGNOSIS — R531 Weakness: Secondary | ICD-10-CM | POA: Diagnosis not present

## 2020-04-24 DIAGNOSIS — W19XXXA Unspecified fall, initial encounter: Secondary | ICD-10-CM | POA: Diagnosis not present

## 2020-04-24 DIAGNOSIS — Y92009 Unspecified place in unspecified non-institutional (private) residence as the place of occurrence of the external cause: Secondary | ICD-10-CM | POA: Diagnosis not present

## 2020-04-24 DIAGNOSIS — Z79899 Other long term (current) drug therapy: Secondary | ICD-10-CM | POA: Diagnosis not present

## 2020-04-24 DIAGNOSIS — G894 Chronic pain syndrome: Secondary | ICD-10-CM | POA: Diagnosis not present

## 2020-05-16 DIAGNOSIS — M48061 Spinal stenosis, lumbar region without neurogenic claudication: Secondary | ICD-10-CM | POA: Diagnosis not present

## 2020-05-16 DIAGNOSIS — R531 Weakness: Secondary | ICD-10-CM | POA: Diagnosis not present

## 2020-05-16 DIAGNOSIS — I5042 Chronic combined systolic (congestive) and diastolic (congestive) heart failure: Secondary | ICD-10-CM | POA: Diagnosis not present

## 2020-05-23 DIAGNOSIS — I5032 Chronic diastolic (congestive) heart failure: Secondary | ICD-10-CM | POA: Diagnosis not present

## 2020-05-24 DIAGNOSIS — I5032 Chronic diastolic (congestive) heart failure: Secondary | ICD-10-CM | POA: Diagnosis not present

## 2020-05-25 DIAGNOSIS — Z79899 Other long term (current) drug therapy: Secondary | ICD-10-CM | POA: Diagnosis not present

## 2020-05-25 DIAGNOSIS — M79606 Pain in leg, unspecified: Secondary | ICD-10-CM | POA: Diagnosis not present

## 2020-05-25 DIAGNOSIS — G894 Chronic pain syndrome: Secondary | ICD-10-CM | POA: Diagnosis not present

## 2020-05-25 DIAGNOSIS — E559 Vitamin D deficiency, unspecified: Secondary | ICD-10-CM | POA: Diagnosis not present

## 2020-06-16 DIAGNOSIS — R531 Weakness: Secondary | ICD-10-CM | POA: Diagnosis not present

## 2020-06-16 DIAGNOSIS — M48061 Spinal stenosis, lumbar region without neurogenic claudication: Secondary | ICD-10-CM | POA: Diagnosis not present

## 2020-06-16 DIAGNOSIS — I5042 Chronic combined systolic (congestive) and diastolic (congestive) heart failure: Secondary | ICD-10-CM | POA: Diagnosis not present

## 2020-07-02 IMAGING — DX DG CHEST 1V PORT
1 series · 1 of 1 positions shown · non-contrast
Comparison: 10/05/2015

CLINICAL DATA: Shortness of breath

EXAM:
PORTABLE CHEST 1 VIEW

[chest ap]
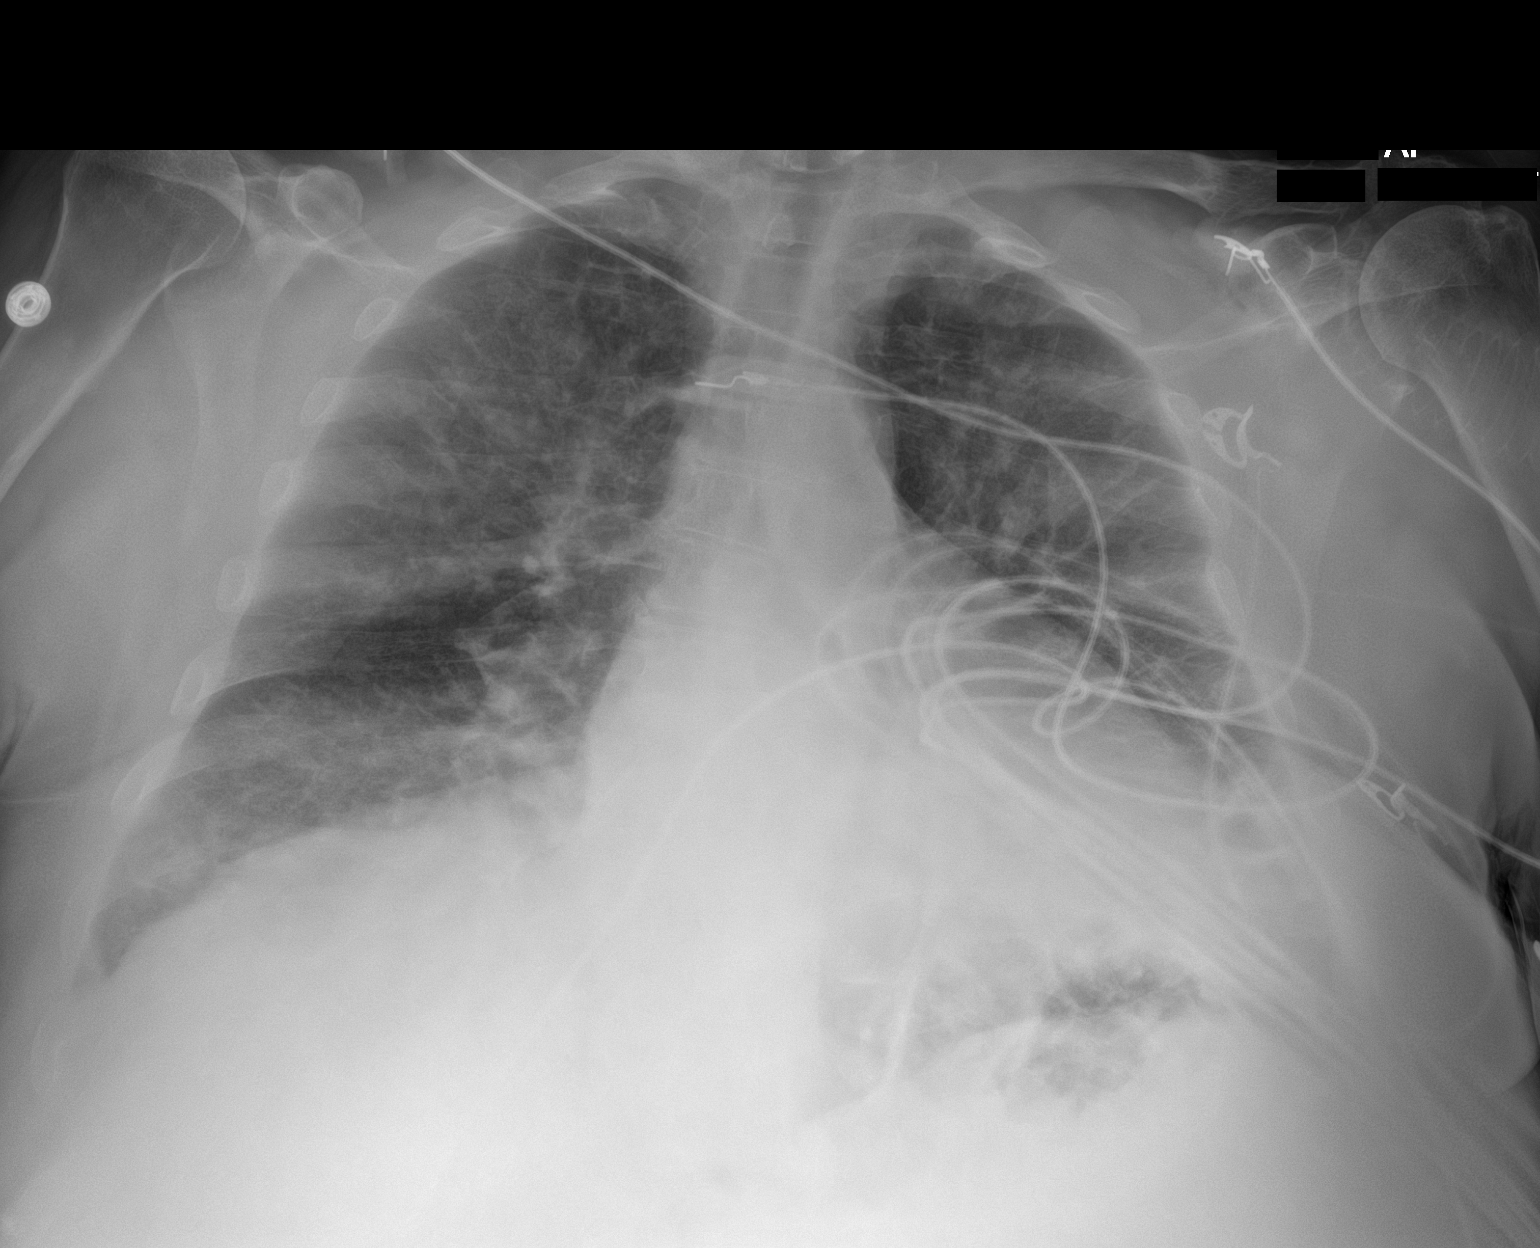

[1 of 1 positions shown; findings below may reference images not displayed]

FINDINGS: Leads overlie the chest particularly the left chest.

Cardiomediastinal contours remain enlarged. Increased retrocardiac
density is noted.

Increased interstitial markings are noted bilaterally diffusely
throughout the chest.

Visualized skeletal structures are unremarkable.

Upper abdominal bowel gas projects over the left hemidiaphragm on
this AP lordotic view.
IMPRESSION: 1. Diffuse interstitial prominence likely edema.
2. Increase density in the retrocardiac region may represent
atelectasis or developing infection.
3. Bowel gas pattern unusual with some bowel loops appearing to
project above the left hemidiaphragm, this is of uncertain
significance though could be related to lordotic positioning,
consider further evaluation with abdominal radiographs.

## 2020-07-04 ENCOUNTER — Encounter: Payer: Self-pay | Admitting: Family Medicine

## 2020-07-04 ENCOUNTER — Other Ambulatory Visit: Payer: Self-pay

## 2020-07-04 ENCOUNTER — Ambulatory Visit (INDEPENDENT_AMBULATORY_CARE_PROVIDER_SITE_OTHER): Payer: Medicare Other | Admitting: Family Medicine

## 2020-07-04 VITALS — BP 138/84 | HR 96 | Temp 98.5°F | Resp 14 | Ht 59.0 in | Wt 213.0 lb

## 2020-07-04 DIAGNOSIS — I890 Lymphedema, not elsewhere classified: Secondary | ICD-10-CM

## 2020-07-04 DIAGNOSIS — I5042 Chronic combined systolic (congestive) and diastolic (congestive) heart failure: Secondary | ICD-10-CM

## 2020-07-04 DIAGNOSIS — Z23 Encounter for immunization: Secondary | ICD-10-CM | POA: Diagnosis not present

## 2020-07-04 DIAGNOSIS — G894 Chronic pain syndrome: Secondary | ICD-10-CM | POA: Diagnosis not present

## 2020-07-04 DIAGNOSIS — I10 Essential (primary) hypertension: Secondary | ICD-10-CM

## 2020-07-04 DIAGNOSIS — E669 Obesity, unspecified: Secondary | ICD-10-CM | POA: Diagnosis not present

## 2020-07-04 DIAGNOSIS — E038 Other specified hypothyroidism: Secondary | ICD-10-CM

## 2020-07-04 DIAGNOSIS — E1149 Type 2 diabetes mellitus with other diabetic neurological complication: Secondary | ICD-10-CM

## 2020-07-04 NOTE — Progress Notes (Signed)
   Subjective:    Patient ID: Michael [name], male    DOB: [birth year], 75 y.o.   MRN: [id number]  Patient presents for Follow-up (is not fasting)  Pt here to f/u chronic medical problems, due for flu shot  Meds reviewed  No new concerns  DM-  Currently on metformin and lantus 18 units , last A1C 6.9% in July   CBG typically 110-120's, he had 1 elevated in the  160's   HTN- taking meds a prescribed   Chronic lymphedema   Hyperlipidemia- lipid panel done in July LDL down to  82   HYPOTHYROIDIMS- TAKING levothyroxine 157mg ,he was taking with all his meds, now taking separetly , he is due for recheck    He finished [place] He now has a lift chair  Weight is up 11lbs, states he has been eating more snacks, foods he shouldn't, cheese puffs  Review Of Systems:  GEN- denies fatigue, fever, weight loss,weakness, recent illness HEENT- denies eye drainage, change in vision, nasal discharge, CVS- denies chest pain, palpitations RESP- denies SOB, cough, wheeze ABD- denies N/V, change in stools, abd pain GU- denies dysuria, hematuria, dribbling, incontinence MSK- + joint pain, muscle aches, injury Neuro- denies headache, dizziness, syncope, seizure activity       Objective:    BP 138/84   Pulse 96   Temp 98.5 F (36.9 C) (Temporal)   Resp 14   Ht 4' 11"  (1.499 m)   Wt 213 lb (96.6 kg)   SpO2 97%   BMI 43.02 kg/m  GEN- NAD, alert and oriented x3,sitting in wheelchair  HEENT- PERRL,  EOMI, MMM Neck supple, no LAD  CVS- RRR, no murmur RESP-CTAB ABD-NABS,soft,NT,ND  EXT- chronic lymphedema bilat , chronic erythema- venous statis changes Pulses- Radial,2+, DP not palpated      Assessment & Plan:      Problem List Items Addressed This Visit      Unprioritized   Chronic combined systolic and diastolic heart failure (HCC)   Chronic pain syndrome    Followed by pain clinic.      Class 3 obesity   Essential hypertension    Pressure looks okay.  He  does not look overtly fluid overloaded I do believe he has gained weight in general.  Discussed cutting back on the processed food and the junk food.  This will eventually take his fluid balance and he will be back in symptomatic.  Continue diuretics as prescribed by cardiology.      Hypothyroidism    Recheck TSH.  Adjust medication  as needed.      Relevant Orders   TSH   Lymphedema   Type II diabetes mellitus with neurological manifestations ([facility]ance - Primary    He states that his blood sugars have been been good however I am concerned with the weight gain that his A1c may be going up so we will recheck A1c today.  Continue Lantus and Metformin.      Relevant Orders   CBC with Differential/Platelet   Comprehensive metabolic panel   Hemoglobin A1c    Other Visit Diagnoses    Need for immunization against influenza       Relevant Orders   Flu Vaccine QUAD High Dose(Fluad) (Completed)      Note: This dictation was prepared with Dragon dictation along with smaller phrase technology. Any transcriptional errors that result from this process are unintentional.

## 2020-07-04 NOTE — Assessment & Plan Note (Signed)
He states that his blood sugars have been been good however I am concerned with the weight gain that his A1c may be going up so we will recheck A1c today.  Continue Lantus and Metformin.

## 2020-07-04 NOTE — Assessment & Plan Note (Signed)
Followed by pain clinic.  

## 2020-07-04 NOTE — Assessment & Plan Note (Signed)
Pressure looks okay.  He does not look overtly fluid overloaded I do believe he has gained weight in general.  Discussed cutting back on the processed food and the junk food.  This will eventually take his fluid balance and he will be back in symptomatic.  Continue diuretics as prescribed by cardiology.

## 2020-07-04 NOTE — Patient Instructions (Addendum)
Cancel appt for November Flu shot given  F/U January

## 2020-07-04 NOTE — Assessment & Plan Note (Signed)
Recheck TSH.  Adjust medication  as needed.

## 2020-07-05 LAB — HEMOGLOBIN A1C
Hgb A1c MFr Bld: 7.1 % of total Hgb — ABNORMAL HIGH (ref <5.7)
Mean Plasma Glucose: 157 (calc)
eAG (mmol/L): 8.7 (calc)

## 2020-07-05 LAB — CBC WITH DIFFERENTIAL/PLATELET
Absolute Monocytes: 366 cells/uL (ref 200–950)
Basophils Absolute: 48 cells/uL (ref 0–200)
Basophils Relative: 1.3 %
Eosinophils Absolute: 100 cells/uL (ref 15–500)
Eosinophils Relative: 2.7 %
HCT: 35.5 % — ABNORMAL LOW (ref 38.5–50.0)
Hemoglobin: 11.5 g/dL — ABNORMAL LOW (ref 13.2–17.1)
Lymphs Abs: 992 cells/uL (ref 850–3900)
MCH: 27.3 pg (ref 27.0–33.0)
MCHC: 32.4 g/dL (ref 32.0–36.0)
MCV: 84.1 fL (ref 80.0–100.0)
MPV: 10.2 fL (ref 7.5–12.5)
Monocytes Relative: 9.9 %
Neutro Abs: 2194 cells/uL (ref 1500–7800)
Neutrophils Relative %: 59.3 %
Platelets: 179 10*3/uL (ref 140–400)
RBC: 4.22 10*6/uL (ref 4.20–5.80)
RDW: 14 % (ref 11.0–15.0)
Total Lymphocyte: 26.8 %
WBC: 3.7 10*3/uL — ABNORMAL LOW (ref 3.8–10.8)

## 2020-07-05 LAB — COMPREHENSIVE METABOLIC PANEL
AG Ratio: 2 (calc) (ref 1.0–2.5)
ALT: 18 U/L (ref 9–46)
AST: 16 U/L (ref 10–35)
Albumin: 4 g/dL (ref 3.6–5.1)
Alkaline phosphatase (APISO): 54 U/L (ref 35–144)
BUN: 12 mg/dL (ref 7–25)
CO2: 26 mmol/L (ref 20–32)
Calcium: 9 mg/dL (ref 8.6–10.3)
Chloride: 102 mmol/L (ref 98–110)
Creat: 0.82 mg/dL (ref 0.70–1.18)
Globulin: 2 g/dL (calc) (ref 1.9–3.7)
Glucose, Bld: 173 mg/dL — ABNORMAL HIGH (ref 65–99)
Potassium: 4.7 mmol/L (ref 3.5–5.3)
Sodium: 138 mmol/L (ref 135–146)
Total Bilirubin: 0.4 mg/dL (ref 0.2–1.2)
Total Protein: 6 g/dL — ABNORMAL LOW (ref 6.1–8.1)

## 2020-07-05 LAB — TSH: TSH: 5.29 mIU/L — ABNORMAL HIGH (ref 0.40–4.50)

## 2020-07-10 ENCOUNTER — Encounter: Payer: Self-pay | Admitting: *Deleted

## 2020-07-10 ENCOUNTER — Other Ambulatory Visit: Payer: Self-pay | Admitting: *Deleted

## 2020-07-10 DIAGNOSIS — E038 Other specified hypothyroidism: Secondary | ICD-10-CM

## 2020-07-10 DIAGNOSIS — D649 Anemia, unspecified: Secondary | ICD-10-CM

## 2020-07-10 MED ORDER — LEVOTHYROXINE SODIUM 112 MCG PO TABS
112.0000 ug | ORAL_TABLET | Freq: Every day | ORAL | 1 refills | Status: DC
Start: 2020-07-10 — End: 2020-08-11

## 2020-07-16 DIAGNOSIS — M48061 Spinal stenosis, lumbar region without neurogenic claudication: Secondary | ICD-10-CM | POA: Diagnosis not present

## 2020-07-16 DIAGNOSIS — I5042 Chronic combined systolic (congestive) and diastolic (congestive) heart failure: Secondary | ICD-10-CM | POA: Diagnosis not present

## 2020-07-16 DIAGNOSIS — R531 Weakness: Secondary | ICD-10-CM | POA: Diagnosis not present

## 2020-07-25 DIAGNOSIS — M79606 Pain in leg, unspecified: Secondary | ICD-10-CM | POA: Diagnosis not present

## 2020-07-25 DIAGNOSIS — E559 Vitamin D deficiency, unspecified: Secondary | ICD-10-CM | POA: Diagnosis not present

## 2020-07-25 DIAGNOSIS — Z79899 Other long term (current) drug therapy: Secondary | ICD-10-CM | POA: Diagnosis not present

## 2020-07-25 DIAGNOSIS — G894 Chronic pain syndrome: Secondary | ICD-10-CM | POA: Diagnosis not present

## 2020-08-11 ENCOUNTER — Telehealth: Payer: Self-pay | Admitting: *Deleted

## 2020-08-11 ENCOUNTER — Other Ambulatory Visit: Payer: Self-pay | Admitting: Family Medicine

## 2020-08-11 MED ORDER — VITAMIN D (ERGOCALCIFEROL) 1.25 MG (50000 UNIT) PO CAPS
50000.0000 [IU] | ORAL_CAPSULE | ORAL | 0 refills | Status: DC
Start: 2020-08-11 — End: 2021-05-24

## 2020-08-11 NOTE — Telephone Encounter (Signed)
Received labs from [facility].   MD reviewed and recommendations are as follows: Vit D is low. Begin Vit D 50,000IU PO  Q week x12 weeks.   Call placed to patient and patient made aware. Agreeable to plan. Prescription sent to pharmacy.

## 2020-08-16 ENCOUNTER — Ambulatory Visit: Payer: Medicare Other | Admitting: Family Medicine

## 2020-08-16 DIAGNOSIS — I5042 Chronic combined systolic (congestive) and diastolic (congestive) heart failure: Secondary | ICD-10-CM | POA: Diagnosis not present

## 2020-08-16 DIAGNOSIS — M48061 Spinal stenosis, lumbar region without neurogenic claudication: Secondary | ICD-10-CM | POA: Diagnosis not present

## 2020-08-16 DIAGNOSIS — R531 Weakness: Secondary | ICD-10-CM | POA: Diagnosis not present

## 2020-08-29 DIAGNOSIS — Z79899 Other long term (current) drug therapy: Secondary | ICD-10-CM | POA: Diagnosis not present

## 2020-08-29 DIAGNOSIS — G894 Chronic pain syndrome: Secondary | ICD-10-CM | POA: Diagnosis not present

## 2020-08-29 DIAGNOSIS — M79606 Pain in leg, unspecified: Secondary | ICD-10-CM | POA: Diagnosis not present

## 2020-09-04 ENCOUNTER — Ambulatory Visit (INDEPENDENT_AMBULATORY_CARE_PROVIDER_SITE_OTHER): Payer: Medicare Other | Admitting: Orthopedic Surgery

## 2020-09-04 ENCOUNTER — Encounter: Payer: Self-pay | Admitting: Orthopedic Surgery

## 2020-09-04 ENCOUNTER — Other Ambulatory Visit: Payer: Self-pay

## 2020-09-04 VITALS — BP 161/91 | HR 82 | Ht 59.0 in | Wt 213.0 lb

## 2020-09-04 DIAGNOSIS — M25561 Pain in right knee: Secondary | ICD-10-CM

## 2020-09-04 DIAGNOSIS — G8929 Other chronic pain: Secondary | ICD-10-CM

## 2020-09-04 NOTE — Progress Notes (Signed)
Chief Complaint  Patient presents with  . Knee Pain    right knee pain     Procedure note right knee injection   verbal consent was obtained to inject right knee joint  Timeout was completed to confirm the site of injection  The medications used were 40 mg of Depo-Medrol and 1% lidocaine 3 cc  Anesthesia was provided by ethyl chloride and the skin was prepped with alcohol.  After cleaning the skin with alcohol a 20-gauge needle was used to inject the right knee joint. There were no complications. A sterile bandage was applied.  Encounter Diagnosis  Name Primary?  . Chronic pain of right knee Yes

## 2020-09-04 NOTE — Patient Instructions (Signed)

## 2020-09-15 DIAGNOSIS — M48061 Spinal stenosis, lumbar region without neurogenic claudication: Secondary | ICD-10-CM | POA: Diagnosis not present

## 2020-09-15 DIAGNOSIS — I5042 Chronic combined systolic (congestive) and diastolic (congestive) heart failure: Secondary | ICD-10-CM | POA: Diagnosis not present

## 2020-09-15 DIAGNOSIS — R531 Weakness: Secondary | ICD-10-CM | POA: Diagnosis not present

## 2020-10-27 DIAGNOSIS — M79606 Pain in leg, unspecified: Secondary | ICD-10-CM | POA: Diagnosis not present

## 2020-10-27 DIAGNOSIS — Z79899 Other long term (current) drug therapy: Secondary | ICD-10-CM | POA: Diagnosis not present

## 2020-10-27 DIAGNOSIS — G894 Chronic pain syndrome: Secondary | ICD-10-CM | POA: Diagnosis not present

## 2020-11-20 ENCOUNTER — Encounter: Payer: Self-pay | Admitting: Nurse Practitioner

## 2020-11-20 ENCOUNTER — Ambulatory Visit (INDEPENDENT_AMBULATORY_CARE_PROVIDER_SITE_OTHER): Payer: Medicare Other | Admitting: Nurse Practitioner

## 2020-11-20 ENCOUNTER — Other Ambulatory Visit: Payer: Self-pay

## 2020-11-20 VITALS — BP 142/88 | HR 82 | Temp 97.2°F | Ht 59.0 in

## 2020-11-20 DIAGNOSIS — M542 Cervicalgia: Secondary | ICD-10-CM

## 2020-11-20 MED ORDER — TIZANIDINE HCL 2 MG PO TABS: 2.0000 mg | ORAL_TABLET | Freq: Three times a day (TID) | ORAL | 0 refills | Status: DC | PRN

## 2020-11-20 NOTE — Patient Instructions (Addendum)
F/u if pain does not improve  Neck Exercises Ask your health care provider which exercises are safe for you. Do exercises exactly as told by your health care provider and adjust them as directed. It is normal to feel mild stretching, pulling, tightness, or discomfort as you do these exercises. Stop right away if you feel sudden pain or your pain gets worse. Do not begin these exercises until told by your health care provider. Neck exercises can be important for many reasons. They can improve strength and maintain flexibility in your neck, which will help your upper back and prevent neck pain. Stretching exercises Rotation neck stretching 1. Sit in a chair or stand up. 2. Place your feet flat on the floor, shoulder width apart. 3. Slowly turn your head (rotate) to the right until a slight stretch is felt. Turn it all the way to the right so you can look over your right shoulder. Do not tilt or tip your head. 4. Hold this position for 10-30 seconds. 5. Slowly turn your head (rotate) to the left until a slight stretch is felt. Turn it all the way to the left so you can look over your left shoulder. Do not tilt or tip your head. 6. Hold this position for 10-30 seconds. Repeat __________ times. Complete this exercise __________ times a day.   Neck retraction 1. Sit in a sturdy chair or stand up. 2. Look straight ahead. Do not bend your neck. 3. Use your fingers to push your chin backward (retraction). Do not bend your neck for this movement. Continue to face straight ahead. If you are doing the exercise properly, you will feel a slight sensation in your throat and a stretch at the back of your neck. 4. Hold the stretch for 1-2 seconds. Repeat __________ times. Complete this exercise __________ times a day. Strengthening exercises Neck press 1. Lie on your back on a firm bed or on the floor with a pillow under your head. 2. Use your neck muscles to push your head down on the pillow and straighten  your spine. 3. Hold the position as well as you can. Keep your head facing up (in a neutral position) and your chin tucked. 4. Slowly count to 5 while holding this position. Repeat __________ times. Complete this exercise __________ times a day. Isometrics These are exercises in which you strengthen the muscles in your neck while keeping your neck still (isometrics). 1. Sit in a supportive chair and place your hand on your forehead. 2. Keep your head and face facing straight ahead. Do not flex or extend your neck while doing isometrics. 3. Push forward with your head and neck while pushing back with your hand. Hold for 10 seconds. 4. Do the sequence again, this time putting your hand against the back of your head. Use your head and neck to push backward against the hand pressure. 5. Finally, do the same exercise on either side of your head, pushing sideways against the pressure of your hand. Repeat __________ times. Complete this exercise __________ times a day. Prone head lifts 1. Lie face-down (prone position), resting on your elbows so that your chest and upper back are raised. 2. Start with your head facing downward, near your chest. Position your chin either on or near your chest. 3. Slowly lift your head upward. Lift until you are looking straight ahead. Then continue lifting your head as far back as you can comfortably stretch. 4. Hold your head up for 5 seconds. Then slowly  lower it to your starting position. Repeat __________ times. Complete this exercise __________ times a day. Supine head lifts 1. Lie on your back (supine position), bending your knees to point to the ceiling and keeping your feet flat on the floor. 2. Lift your head slowly off the floor, raising your chin toward your chest. 3. Hold for 5 seconds. Repeat __________ times. Complete this exercise __________ times a day. Scapular retraction 1. Stand with your arms at your sides. Look straight ahead. 2. Slowly pull  both shoulders (scapulae) backward and downward (retraction) until you feel a stretch between your shoulder blades in your upper back. 3. Hold for 10-30 seconds. 4. Relax and repeat. Repeat __________ times. Complete this exercise __________ times a day. Contact a health care provider if:  Your neck pain or discomfort gets much worse when you do an exercise.  Your neck pain or discomfort does not improve within 2 hours after you exercise. If you have any of these problems, stop exercising right away. Do not do the exercises again unless your health care provider says that you can. Get help right away if:  You develop sudden, severe neck pain. If this happens, stop exercising right away. Do not do the exercises again unless your health care provider says that you can. This information is not intended to replace advice given to you by your health care provider. Make sure you discuss any questions you have with your health care provider. Document Revised: 07/15/2018 Document Reviewed: 07/15/2018 Elsevier Patient Education  2021 [employer].

## 2020-11-20 NOTE — Progress Notes (Signed)
Subjective:    Patient ID: Michael [name], male    DOB: [birth year], 75 y.o.   MRN: [id number]  HPI: Michael [name] is a 75 y.o. male presenting for neck pain.  Chief Complaint  Patient presents with  . Pain    Neck pain on left side for the past month. Taking tylenol otc and voltaren for the pain. Pt is wheelchair bound, no weight taken   NECK PAIN Onset: about 1 month ago; sleeps in recliner and first noticed when he woke up one morning. Location:Left Duration:months Severity: 8/10 Quality: "crick in neck" Frequency: constant and comes and goes Radiation: up left side of neck/back of head Aggravating factors: certain movements, turning neck a certain way Alleviating factors: heat, APAP and muscle relaxer Status: stable Treatments attempted: voltaren gel; Tylenol  Relief with NSAIDs?:  mild Weakness:  no Paresthesias / decreased sensation:  no  Fevers:  no  Recent falls: no Trauma or injury: no  No Known Allergies  Outpatient Encounter Medications as of 11/20/2020  Medication Sig  . aspirin EC 81 MG tablet Take 81 mg by mouth daily.  . bumetanide (BUMEX) 2 MG tablet Take 1.5 tablets (3 mg total) by mouth 2 (two) times daily.  . carvedilol (COREG) 12.5 MG tablet Take 1 tablet (12.5 mg total) by mouth 2 (two) times daily.  . diclofenac sodium (VOLTAREN) 1 % GEL Apply 4 g topically 4 (four) times daily as needed (pain).  . Ferrous Sulfate (IRON) 325 (65 FE) MG TABS Take 1 tablet by mouth daily.   Marland Kitchen gabapentin (NEURONTIN) 400 MG capsule Take 1 capsule (400 mg total) by mouth 3 (three) times daily.  Marland Kitchen HYDROcodone-acetaminophen (NORCO) 10-325 MG tablet Take 1 tablet by mouth 3 (three) times daily as needed.  . Insulin Glargine (LANTUS SOLOSTAR) 100 UNIT/ML Solostar Pen Inject 5 Units into the skin daily at 10 pm.  . levothyroxine (SYNTHROID) 112 MCG tablet TAKE 1 TABLET BY MOUTH ONCE DAILY.  . magnesium oxide (MAG-OX) 400 MG tablet Take 1 tablet (400 mg total) by mouth 2  (two) times daily.  . metFORMIN (GLUCOPHAGE) 1000 MG tablet TAKE ONE TABLET BY MOUTH TWICE DAILY WITH A MEAL.  Marland Kitchen metolazone (ZAROXOLYN) 2.5 MG tablet Two Times Weekly on Monday and Thursday  . nystatin cream (MYCOSTATIN) APPLY TO AFFECTED AREA TWICE DAILY.  . pantoprazole (PROTONIX) 40 MG tablet Take 1 tablet (40 mg total) by mouth daily.  . potassium chloride (K-DUR) 10 MEQ tablet Take 1 tablet (10 mEq total) by mouth 2 (two) times daily.  . pravastatin (PRAVACHOL) 40 MG tablet TAKE ONE TABLET BY MOUTH DAILY.  . sacubitril-valsartan (ENTRESTO) 49-51 MG Take 1 tablet by mouth 2 (two) times daily.  Marland Kitchen tiZANidine (ZANAFLEX) 2 MG tablet Take 1 tablet (2 mg total) by mouth every 8 (eight) hours as needed for muscle spasms.  . traZODone (DESYREL) 100 MG tablet TAKE 1 TABLET BY MOUTH AT BEDTIME AS NEEDED FOR SLEEP.  Marland Kitchen triamcinolone cream (KENALOG) 0.1 % Apply 1 application topically 2 (two) times daily. To arms/hand  . Vitamin D, Ergocalciferol, (DRISDOL) 1.25 MG (50000 UNIT) CAPS capsule Take 1 capsule (50,000 Units total) by mouth every 7 (seven) days. x12 weeks, then D/C.  Marland Kitchen XTAMPZA ER 9 MG C12A Take 1 capsule by mouth 2 (two) times daily.  . [DISCONTINUED] acetaminophen (TYLENOL) 500 MG tablet Take 1,000 mg by mouth daily as needed.   No facility-administered encounter medications on file as of 11/20/2020.    Patient  Active Problem List   Diagnosis Date Noted  . Neck pain 11/20/2020  . Pressure injury of skin, stage 1 12/07/2019  . Demand ischemia ([place]) 09/23/2019  . Chronic insomnia 11/18/2018  . Chronic pain syndrome 05/06/2018  . Depression, major, single episode, mild ([place]) 06/20/2017  . DNR (do not resuscitate) 06/20/2017  . Chronic combined systolic and diastolic heart failure ([place]) 10/23/2016  . Generalized weakness 08/01/2016  . Intertriginous candidiasis 12/23/2014  . Hypothyroidism 11/30/2014  . Sinus pause 07/21/2014  . Liver fibrosis 06/29/2014  . IDA (iron deficiency anemia)  06/29/2014  . Iron deficiency anemia 05/31/2014  . Hepatomegaly 05/25/2014  . Difficulty walking 05/03/2014  . Knee pain, chronic 05/03/2014  . Dermatitis 04/12/2014  . Spinal stenosis of lumbar region 07/06/2013  . Carpal tunnel syndrome 04/13/2013  . Lymphedema 04/13/2013  . DDD (degenerative disc disease), lumbosacral 04/13/2013  . Peripheral neuropathy 01/05/2013  . S/P arthroscopy of left knee 11/09/2012  . Meniscus tear 10/07/2012  . NASH (nonalcoholic steatohepatitis) 05/22/2007  . Type II diabetes mellitus with neurological manifestations ([place]) 05/20/2007  . Hyperlipidemia 10/15/2006  . Class 3 obesity 10/15/2006  . Essential hypertension 10/15/2006  . GERD 10/15/2006    Past Medical History:  Diagnosis Date  . Arthritis   . Congestive heart disease ([place]) 11/2015  . COPD (chronic obstructive pulmonary disease) ([place])   . DDD (degenerative disc disease), lumbosacral   . Depression   . Diabetic neuropathy ([place])   . Diastolic dysfunction   . Essential hypertension   . GERD (gastroesophageal reflux disease)   . Hyperlipidemia   . Iron deficiency anemia   . Left knee DJD   . Lumbar spinal stenosis   . Lymphedema 11/2015   BOTH LEGS  . NASH (nonalcoholic steatohepatitis)   . Peripheral edema   . Peripheral edema 10/05/2015  . Type 2 diabetes mellitus ([facility])     Relevant past medical, surgical, family and social history reviewed and updated as indicated. Interim medical history since our last visit reviewed.  Review of Systems Per HPI unless specifically indicated above     Objective:    BP (!) 142/88   Pulse 82   Temp (!) 97.2 F (36.2 C)   Ht 4' 11"  (1.499 m)   SpO2 97%   BMI 43.02 kg/m   Wt Readings from Last 3 Encounters:  09/04/20 213 lb (96.6 kg)  07/04/20 213 lb (96.6 kg)  04/07/20 201 lb 1 oz (91.2 kg)    Physical Exam Vitals and nursing note reviewed.  Constitutional:      General: He is not in acute distress.    Appearance: Normal  appearance. He is not toxic-appearing.     Comments: Patient sitting in wheelchair  Eyes:     General: No scleral icterus.    Extraocular Movements: Extraocular movements intact.  Musculoskeletal:     Cervical back: Normal range of motion and neck supple. Tenderness present. No erythema or rigidity. Pain with movement and muscular tenderness present. Normal range of motion.  Lymphadenopathy:     Cervical: No cervical adenopathy.  Skin:    General: Skin is warm and dry.     Capillary Refill: Capillary refill takes less than 2 seconds.     Coloration: Skin is not jaundiced or pale.     Findings: No erythema.  Neurological:     Mental Status: He is alert and oriented to person, place, and time. Mental status is at baseline.  Psychiatric:  Mood and Affect: Mood normal.        Behavior: Behavior normal.        Thought Content: Thought content normal.        Judgment: Judgment normal.       Assessment & Plan:   Problem List Items Addressed This Visit      Other   Neck pain - Primary    Acute, ongoing.  Has chronic pain that is managed by specialist; however this is different.  Suspect muscular strain due to sleeping in recliner.  Neck exercises given along with muscle relaxant.  Encouraged use of Tylenol and ice/heat.  Avoid NSAIDs due to heart failure.  If pain persists, return to clinic for re-evaluation; may consider imaging or Physical Therapy at that time.      Relevant Medications   tiZANidine (ZANAFLEX) 2 MG tablet       Follow up plan: Return if symptoms worsen or fail to improve.

## 2020-11-20 NOTE — Assessment & Plan Note (Signed)
Acute, ongoing.  Has chronic pain that is managed by specialist; however this is different.  Suspect muscular strain due to sleeping in recliner.  Neck exercises given along with muscle relaxant.  Encouraged use of Tylenol and ice/heat.  Avoid NSAIDs due to heart failure.  If pain persists, return to clinic for re-evaluation; may consider imaging or Physical Therapy at that time.

## 2020-12-04 ENCOUNTER — Ambulatory Visit (INDEPENDENT_AMBULATORY_CARE_PROVIDER_SITE_OTHER): Payer: Medicare Other | Admitting: Orthopedic Surgery

## 2020-12-04 ENCOUNTER — Other Ambulatory Visit: Payer: Self-pay

## 2020-12-04 VITALS — BP 138/78 | HR 78 | Ht 59.0 in | Wt 217.0 lb

## 2020-12-04 DIAGNOSIS — G8929 Other chronic pain: Secondary | ICD-10-CM

## 2020-12-04 DIAGNOSIS — M25561 Pain in right knee: Secondary | ICD-10-CM

## 2020-12-04 NOTE — Progress Notes (Signed)
Chief Complaint  Patient presents with  . Follow-up    Inject right knee requested    Procedure note right knee injection   verbal consent was obtained to inject right knee joint  Timeout was completed to confirm the site of injection  The medications used were 25m celestone  and 1% lidocaine 3 cc  Anesthesia was provided by ethyl chloride and the skin was prepped with alcohol.  After cleaning the skin with alcohol a 20-gauge needle was used to inject the right knee joint. There were no complications. A sterile bandage was applied.  Encounter Diagnosis  Name Primary?  . Chronic pain of right knee Yes

## 2020-12-26 DIAGNOSIS — M79606 Pain in leg, unspecified: Secondary | ICD-10-CM | POA: Diagnosis not present

## 2020-12-26 DIAGNOSIS — G894 Chronic pain syndrome: Secondary | ICD-10-CM | POA: Diagnosis not present

## 2020-12-26 DIAGNOSIS — E119 Type 2 diabetes mellitus without complications: Secondary | ICD-10-CM | POA: Diagnosis not present

## 2020-12-26 DIAGNOSIS — Z79899 Other long term (current) drug therapy: Secondary | ICD-10-CM | POA: Diagnosis not present

## 2021-01-11 ENCOUNTER — Ambulatory Visit (INDEPENDENT_AMBULATORY_CARE_PROVIDER_SITE_OTHER): Payer: Medicare Other | Admitting: Internal Medicine

## 2021-01-11 ENCOUNTER — Other Ambulatory Visit: Payer: Self-pay

## 2021-01-11 ENCOUNTER — Encounter: Payer: Self-pay | Admitting: Internal Medicine

## 2021-01-11 VITALS — BP 164/72 | HR 83 | Resp 16 | Ht 59.0 in | Wt 218.4 lb

## 2021-01-11 DIAGNOSIS — I5042 Chronic combined systolic (congestive) and diastolic (congestive) heart failure: Secondary | ICD-10-CM | POA: Diagnosis not present

## 2021-01-11 DIAGNOSIS — I509 Heart failure, unspecified: Secondary | ICD-10-CM | POA: Insufficient documentation

## 2021-01-11 DIAGNOSIS — Z794 Long term (current) use of insulin: Secondary | ICD-10-CM

## 2021-01-11 DIAGNOSIS — E038 Other specified hypothyroidism: Secondary | ICD-10-CM | POA: Diagnosis not present

## 2021-01-11 DIAGNOSIS — K219 Gastro-esophageal reflux disease without esophagitis: Secondary | ICD-10-CM | POA: Diagnosis not present

## 2021-01-11 DIAGNOSIS — E1142 Type 2 diabetes mellitus with diabetic polyneuropathy: Secondary | ICD-10-CM

## 2021-01-11 DIAGNOSIS — I10 Essential (primary) hypertension: Secondary | ICD-10-CM

## 2021-01-11 DIAGNOSIS — J449 Chronic obstructive pulmonary disease, unspecified: Secondary | ICD-10-CM | POA: Diagnosis not present

## 2021-01-11 DIAGNOSIS — Z86010 Personal history of colonic polyps: Secondary | ICD-10-CM | POA: Insufficient documentation

## 2021-01-11 DIAGNOSIS — E119 Type 2 diabetes mellitus without complications: Secondary | ICD-10-CM | POA: Insufficient documentation

## 2021-01-11 DIAGNOSIS — K439 Ventral hernia without obstruction or gangrene: Secondary | ICD-10-CM | POA: Insufficient documentation

## 2021-01-11 DIAGNOSIS — G894 Chronic pain syndrome: Secondary | ICD-10-CM

## 2021-01-11 DIAGNOSIS — I890 Lymphedema, not elsewhere classified: Secondary | ICD-10-CM | POA: Diagnosis not present

## 2021-01-11 DIAGNOSIS — M109 Gout, unspecified: Secondary | ICD-10-CM | POA: Diagnosis not present

## 2021-01-11 DIAGNOSIS — F419 Anxiety disorder, unspecified: Secondary | ICD-10-CM | POA: Insufficient documentation

## 2021-01-11 DIAGNOSIS — Z7689 Persons encountering health services in other specified circumstances: Secondary | ICD-10-CM | POA: Diagnosis not present

## 2021-01-11 DIAGNOSIS — Z8249 Family history of ischemic heart disease and other diseases of the circulatory system: Secondary | ICD-10-CM | POA: Insufficient documentation

## 2021-01-11 DIAGNOSIS — E1121 Type 2 diabetes mellitus with diabetic nephropathy: Secondary | ICD-10-CM | POA: Insufficient documentation

## 2021-01-11 DIAGNOSIS — L259 Unspecified contact dermatitis, unspecified cause: Secondary | ICD-10-CM | POA: Insufficient documentation

## 2021-01-11 NOTE — Patient Instructions (Signed)
Please continue taking medications as prescribed.  You are being referred to home health for evaluation for physical therapy.

## 2021-01-12 ENCOUNTER — Encounter: Payer: Self-pay | Admitting: Internal Medicine

## 2021-01-12 NOTE — Assessment & Plan Note (Signed)
Lab Results  Component Value Date   TSH 5.29 (H) 07/04/2020   On Levothyroxine

## 2021-01-12 NOTE — Assessment & Plan Note (Signed)
Chronic Applies compression bandages Referred to home PT On diuretics

## 2021-01-12 NOTE — Assessment & Plan Note (Signed)
On Allopurinol

## 2021-01-12 NOTE — Assessment & Plan Note (Signed)
On Gabapentin

## 2021-01-12 NOTE — Assessment & Plan Note (Signed)
BP Readings from Last 1 Encounters:  01/11/21 (!) 164/72   Elevated today, avoid changes in first visit On Entresto and Coreg for HFrEF, also on diuretics- followed by Cardiology Counseled for compliance with the medications Advised DASH diet

## 2021-01-12 NOTE — Progress Notes (Signed)
New Patient Office Visit  Subjective:  Patient ID: Michael [name], male    DOB: 09-23-1946  Age: 75 y.o. MRN: [id number]  CC:  Chief Complaint  Patient presents with  . New Patient (Initial Visit)    New patient has lymphadema in legs and wanted to see if we could do something about this      HPI Michael [name] is a 75 year old male with PMH of HTN, HFrEF, DM with neuropathy, COPD, GERD, hypothyroidism, chronic pain syndrome and chronic lymphedema who presents for establishing care. He is a former patient of Dr Buelah Manis.  He c/o chronic lymphedema, for which his caregiver helps him with compression bandages. He is also on diuretics for it. He is wheelchair bound and has not been able to ambulate for about 5 years. He has not tried home PT yet.  His BP was elevated today. He is on Entresto, Coreg and diuretics. He follows up with Cardiologist for his CHF. He denies any chest pain, dyspnea or palpitations.  He has h/o chronic pain - neck, back and neuropathic leg pain. He is followed by pain clinic. He is on chronic opiate therapy.  He has a caregiver through [place] system, who comes about 6 hours/week. He is dependent for ADLs and IADLs.  He is up-to-date with COVID vaccine.  Past Medical History:  Diagnosis Date  . Arthritis   . Congestive heart disease ([place]) 11/2015  . COPD (chronic obstructive pulmonary disease) ([place])   . DDD (degenerative disc disease), lumbosacral   . Demand ischemia ([place]) 09/23/2019  . Depression   . Diabetic neuropathy ([place])   . Diastolic dysfunction   . Essential hypertension   . GERD (gastroesophageal reflux disease)   . Hyperlipidemia   . Iron deficiency anemia   . Left knee DJD   . Lumbar spinal stenosis   . Lymphedema 11/2015   BOTH LEGS  . NASH (nonalcoholic steatohepatitis)   . Peripheral edema   . Peripheral edema 10/05/2015  . Type 2 diabetes mellitus ([place])     Past Surgical History:  Procedure Laterality Date  . BIOPSY N/A 07/21/2014    Procedure: BIOPSY;  Surgeon: Daneil Dolin, MD;  Location: [facility];  Service: Endoscopy;  Laterality: N/A;  . CATARACT EXTRACTION W/PHACO Right 07/08/2016   Procedure: CATARACT EXTRACTION PHACO AND INTRAOCULAR LENS PLACEMENT RIGHT EYE;  Surgeon: Tonny Branch, MD;  Location: [facility];  Service: Ophthalmology;  Laterality: Right;  CDE: 9.01  . CATARACT EXTRACTION W/PHACO Left 07/22/2016   Procedure: CATARACT EXTRACTION PHACO AND INTRAOCULAR LENS PLACEMENT LEFT EYE CDE=10.69;  Surgeon: Tonny Branch, MD;  Location: [facility];  Service: Ophthalmology;  Laterality: Left;  left -   . COLONOSCOPY  2011   [facility]: normal colon, normal distal ileum  . COLONOSCOPY WITH PROPOFOL N/A 07/21/2014   Procedure: ATTEMPTED COLONOSCOPY WITH PROPOFOL-HAD TO STOP DUE TO BRADYCARDIA;  Surgeon: Daneil Dolin, MD;  Location: [facility];  Service: Endoscopy;  Laterality: N/A;  . EGD with enteroscopy  2011   [facility]: normal esophagus and stomach. Normal duodenum, jejunum. No evidence of AVMs.   . ESOPHAGOGASTRODUODENOSCOPY (EGD) WITH PROPOFOL N/A 07/21/2014   Procedure: ESOPHAGOGASTRODUODENOSCOPY (EGD) WITH PROPOFOL;  Surgeon: Daneil Dolin, MD;  Location: [facility];  Service: Endoscopy;  Laterality: N/A;  . HERNIA REPAIR    . KNEE ARTHROSCOPY WITH MEDIAL MENISECTOMY Left 11/06/2012   Procedure: KNEE ARTHROSCOPY WITH MEDIAL MENISECTOMY;  Surgeon: Carole Civil, MD;  Location: [facility];  Service: Orthopedics;  Laterality: Left;  . Lipoma removal     Stomach  . LUMBAR LAMINECTOMY/DECOMPRESSION MICRODISCECTOMY Left 08/30/2013   Procedure: LUMBAR LAMINECTOMY/DECOMPRESSION MICRODISCECTOMY LEFT  LUMBAR TWO THREE;  Surgeon: Otilio Connors, MD;  Location: [facility];  Service: Neurosurgery;  Laterality: Left;  . RIGHT/LEFT HEART CATH AND CORONARY ANGIOGRAPHY N/A 09/22/2019   Procedure: RIGHT/LEFT HEART CATH AND CORONARY ANGIOGRAPHY;  Surgeon: Martinique, Peter M, MD;  Location: [place] [facility];  Service: Cardiovascular;  Laterality: N/A;   . SHOULDER SURGERY     Rght-rotator cuff  . TOOTH EXTRACTION      Family History  Problem Relation Age of Onset  . Heart disease Mother   . Hyperlipidemia Mother   . Hypertension Mother   . Depression Mother   . Diabetes Mother   . Rectal cancer Mother   . Cancer Mother   . Heart disease Father   . Hypertension Father   . Hyperlipidemia Father   . Diabetes Father   . Cancer Father   . Heart disease Sister   . Hyperlipidemia Sister   . Hypertension Sister   . Diabetes Sister   . Diabetes Brother   . Heart disease Sister   . Hyperlipidemia Sister   . Hypertension Sister   . Heart disease Sister   . Hyperlipidemia Sister   . Hypertension Sister   . Diabetes Sister   . Diabetes Brother     Social History   Socioeconomic History  . Marital status: Widowed    Spouse name: Not on file  . Number of children: Not on file  . Years of education: 38  . Highest education level: Not on file  Occupational History  . Not on file  Tobacco Use  . Smoking status: Former Smoker    Packs/day: 2.50    Years: 44.00    Pack years: 110.00    Types: Cigarettes    Start date: 05/07/1955    Quit date: 10/01/1999    Years since quitting: 21.2  . Smokeless tobacco: Never Used  Vaping Use  . Vaping Use: Never used  Substance and Sexual Activity  . Alcohol use: No    Alcohol/week: 0.0 standard drinks  . Drug use: No  . Sexual activity: Yes    Birth control/protection: None  Other Topics Concern  . Not on file  Social History Narrative  . Not on file   Social Determinants of Health   Financial Resource Strain: Not on file  Food Insecurity: Not on file  Transportation Needs: Not on file  Physical Activity: Not on file  Stress: Not on file  Social Connections: Not on file  Intimate Partner Violence: Not on file    ROS Review of Systems  Constitutional: Negative for chills and fever.  HENT: Negative for congestion and sore throat.   Eyes: Negative for pain and discharge.   Respiratory: Negative for cough and shortness of breath.   Cardiovascular: Positive for leg swelling. Negative for chest pain and palpitations.  Gastrointestinal: Negative for constipation, diarrhea, nausea and vomiting.  Endocrine: Negative for polydipsia and polyuria.  Genitourinary: Negative for dysuria and hematuria.  Musculoskeletal: Positive for arthralgias, back pain and neck pain. Negative for neck stiffness.  Skin: Positive for color change.  Neurological: Positive for numbness. Negative for dizziness and headaches.  Psychiatric/Behavioral: Negative for agitation and behavioral problems.    Objective:   Today's Vitals: BP (!) 164/72 (BP Location: Right Arm)   Pulse 83   Resp 16   Ht 4'  11" (1.499 m)   Wt 218 lb 6.4 oz (99.1 kg)   SpO2 94%   BMI 44.11 kg/m   Physical Exam Vitals reviewed.  Constitutional:      General: He is not in acute distress.    Appearance: He is obese. He is not diaphoretic.     Comments: In wheelchair  HENT:     Head: Normocephalic and atraumatic.     Nose: Nose normal.     Mouth/Throat:     Mouth: Mucous membranes are moist.  Eyes:     General: No scleral icterus.    Extraocular Movements: Extraocular movements intact.     Pupils: Pupils are equal, round, and reactive to light.  Cardiovascular:     Rate and Rhythm: Normal rate and regular rhythm.     Pulses: Normal pulses.     Heart sounds: Normal heart sounds. No murmur heard.   Pulmonary:     Breath sounds: Normal breath sounds. No wheezing or rales.  Abdominal:     Palpations: Abdomen is soft.     Tenderness: There is no abdominal tenderness.  Musculoskeletal:     Cervical back: Neck supple. No tenderness.     Right lower leg: Edema (3+) present.     Left lower leg: Edema (3+) present.  Skin:    General: Skin is warm.     Findings: Erythema (Over b/l LE) present.  Neurological:     General: No focal deficit present.     Mental Status: He is alert and oriented to person,  place, and time.  Psychiatric:        Mood and Affect: Mood normal.        Behavior: Behavior normal.     Assessment & Plan:   Problem List Items Addressed This Visit      Encounter to establish care    -  Primary Care established Previous chart reviewed History and medications reviewed with the patient    Cardiovascular and Mediastinum   Hypertension    BP Readings from Last 1 Encounters:  01/11/21 (!) 164/72   Elevated today, avoid changes in first visit On Entresto and Coreg for HFrEF, also on diuretics- followed by Cardiology Counseled for compliance with the medications Advised DASH diet      Chronic combined systolic and diastolic heart failure (HCC)    Has chronic lymphedema No dyspnea currently On Entresto, Coreg Bumenatide and Metolazone Follows up with Cardiology        Respiratory   Chronic obstructive lung disease ([place])    Mild COPD Albuterol PRN        Digestive   Gastroesophageal reflux disease    On Pantoprazole        Endocrine   Hypothyroidism    Lab Results  Component Value Date   TSH 5.29 (H) 07/04/2020   On Levothyroxine      DM (diabetes mellitus) ([place])    Lab Results  Component Value Date   HGBA1C 7.1 (H) 07/04/2020   On Lantus 5 U qHS and Metformin On ARNI and statin Has diabetic neuropathy - on Gabapentin      Diabetic peripheral neuropathy (HCC)    On Gabapentin      Relevant Medications   baclofen (LIORESAL) 10 MG tablet     Other   Chronic pain syndrome    Followed by pain clinic      Relevant Medications   oxyCODONE-Acetaminophen 10-300 MG/5ML SOLN   baclofen (LIORESAL) 10 MG tablet   Lymphedema  of lower extremity    Chronic Applies compression bandages Referred to home PT On diuretics      Gout    On Allopurinol           Outpatient Encounter Medications as of 01/11/2021  Medication Sig  . allopurinol (ZYLOPRIM) 100 MG tablet Take 100 mg by mouth daily.  Marland Kitchen aspirin EC 81 MG tablet Take 81  mg by mouth daily.  . baclofen (LIORESAL) 10 MG tablet Take 5 mg by mouth 3 (three) times daily as needed.  . bumetanide (BUMEX) 2 MG tablet Take 1.5 tablets (3 mg total) by mouth 2 (two) times daily.  . carvedilol (COREG) 12.5 MG tablet Take 1 tablet (12.5 mg total) by mouth 2 (two) times daily.  . diclofenac sodium (VOLTAREN) 1 % GEL Apply 4 g topically 4 (four) times daily as needed (pain).  . Ferrous Sulfate (IRON) 325 (65 FE) MG TABS Take 1 tablet by mouth daily.   Marland Kitchen gabapentin (NEURONTIN) 400 MG capsule Take 1 capsule (400 mg total) by mouth 3 (three) times daily.  Marland Kitchen HYDROcodone-acetaminophen (NORCO) 10-325 MG tablet Take 1 tablet by mouth 3 (three) times daily as needed.  . Insulin Glargine (LANTUS SOLOSTAR) 100 UNIT/ML Solostar Pen Inject 5 Units into the skin daily at 10 pm.  . levothyroxine (SYNTHROID) 112 MCG tablet TAKE 1 TABLET BY MOUTH ONCE DAILY.  . magnesium oxide (MAG-OX) 400 MG tablet Take 1 tablet (400 mg total) by mouth 2 (two) times daily.  . metFORMIN (GLUCOPHAGE) 1000 MG tablet TAKE ONE TABLET BY MOUTH TWICE DAILY WITH A MEAL.  Marland Kitchen metolazone (ZAROXOLYN) 2.5 MG tablet Two Times Weekly on Monday and Thursday  . nystatin cream (MYCOSTATIN) APPLY TO AFFECTED AREA TWICE DAILY.  Marland Kitchen oxyCODONE-Acetaminophen 10-300 MG/5ML SOLN TAKE 7.5MG BY MOUTH FOUR TIMES A DAY  . pantoprazole (PROTONIX) 40 MG tablet Take 1 tablet (40 mg total) by mouth daily.  . potassium chloride (K-DUR) 10 MEQ tablet Take 1 tablet (10 mEq total) by mouth 2 (two) times daily.  . pravastatin (PRAVACHOL) 40 MG tablet TAKE ONE TABLET BY MOUTH DAILY.  . sacubitril-valsartan (ENTRESTO) 49-51 MG Take 1 tablet by mouth 2 (two) times daily.  Marland Kitchen tiZANidine (ZANAFLEX) 2 MG tablet Take 1 tablet (2 mg total) by mouth every 8 (eight) hours as needed for muscle spasms.  . traZODone (DESYREL) 100 MG tablet TAKE 1 TABLET BY MOUTH AT BEDTIME AS NEEDED FOR SLEEP.  Marland Kitchen triamcinolone cream (KENALOG) 0.1 % Apply 1 application topically  2 (two) times daily. To arms/hand  . Vitamin D, Ergocalciferol, (DRISDOL) 1.25 MG (50000 UNIT) CAPS capsule Take 1 capsule (50,000 Units total) by mouth every 7 (seven) days. x12 weeks, then D/C.  Marland Kitchen XTAMPZA ER 9 MG C12A Take 1 capsule by mouth 2 (two) times daily.   No facility-administered encounter medications on file as of 01/11/2021.    Follow-up: Return in about 3 months (around 04/12/2021) for HTN and lymphedema.   Lindell Spar, MD

## 2021-01-12 NOTE — Assessment & Plan Note (Signed)
Mild COPD Albuterol PRN

## 2021-01-12 NOTE — Assessment & Plan Note (Signed)
Lab Results  Component Value Date   HGBA1C 7.1 (H) 07/04/2020   On Lantus 5 U qHS and Metformin On ARNI and statin Has diabetic neuropathy - on Gabapentin

## 2021-01-12 NOTE — Assessment & Plan Note (Signed)
On Pantoprazole

## 2021-01-12 NOTE — Assessment & Plan Note (Signed)
Followed by pain clinic.  

## 2021-01-12 NOTE — Assessment & Plan Note (Signed)
Has chronic lymphedema No dyspnea currently On Entresto, Coreg Bumenatide and Metolazone Follows up with Cardiology

## 2021-01-17 ENCOUNTER — Encounter: Payer: Self-pay | Admitting: *Deleted

## 2021-02-23 DIAGNOSIS — Z79899 Other long term (current) drug therapy: Secondary | ICD-10-CM | POA: Diagnosis not present

## 2021-02-23 DIAGNOSIS — G894 Chronic pain syndrome: Secondary | ICD-10-CM | POA: Diagnosis not present

## 2021-03-05 ENCOUNTER — Encounter: Payer: Self-pay | Admitting: Orthopedic Surgery

## 2021-03-05 ENCOUNTER — Ambulatory Visit (INDEPENDENT_AMBULATORY_CARE_PROVIDER_SITE_OTHER): Payer: Medicare Other | Admitting: Orthopedic Surgery

## 2021-03-05 ENCOUNTER — Other Ambulatory Visit: Payer: Self-pay

## 2021-03-05 VITALS — Ht 59.0 in | Wt 215.0 lb

## 2021-03-05 DIAGNOSIS — M25561 Pain in right knee: Secondary | ICD-10-CM | POA: Diagnosis not present

## 2021-03-05 DIAGNOSIS — G8929 Other chronic pain: Secondary | ICD-10-CM | POA: Diagnosis not present

## 2021-03-05 DIAGNOSIS — M25562 Pain in left knee: Secondary | ICD-10-CM | POA: Diagnosis not present

## 2021-03-05 DIAGNOSIS — G894 Chronic pain syndrome: Secondary | ICD-10-CM

## 2021-03-05 NOTE — Progress Notes (Signed)
Follow up for injection   Chief Complaint  Patient presents with  . Knee Pain    Bilateral// hurting today and would like to have injections in both.   Encounter Diagnoses  Name Primary?  . Chronic pain syndrome Yes  . Chronic pain of right knee   . Chronic pain of left knee    A steroid injection was performed at left knee  using 1% plain Lidocaine and 40 mg of depomedrol. This was well tolerated.  A steroid injection was performed at right knee  using 1% plain Lidocaine and 40 mg of depomedrol. This was well tolerated.

## 2021-03-05 NOTE — Patient Instructions (Signed)

## 2021-04-12 ENCOUNTER — Ambulatory Visit: Payer: Medicare Other | Admitting: Internal Medicine

## 2021-04-13 ENCOUNTER — Other Ambulatory Visit: Payer: Self-pay

## 2021-04-13 ENCOUNTER — Ambulatory Visit (INDEPENDENT_AMBULATORY_CARE_PROVIDER_SITE_OTHER): Payer: Medicare Other | Admitting: Nurse Practitioner

## 2021-04-13 ENCOUNTER — Encounter: Payer: Self-pay | Admitting: Nurse Practitioner

## 2021-04-13 VITALS — BP 185/95 | HR 89 | Temp 97.6°F | Ht 59.0 in | Wt 215.0 lb

## 2021-04-13 DIAGNOSIS — R319 Hematuria, unspecified: Secondary | ICD-10-CM

## 2021-04-13 DIAGNOSIS — I10 Essential (primary) hypertension: Secondary | ICD-10-CM

## 2021-04-13 DIAGNOSIS — N390 Urinary tract infection, site not specified: Secondary | ICD-10-CM

## 2021-04-13 DIAGNOSIS — E1142 Type 2 diabetes mellitus with diabetic polyneuropathy: Secondary | ICD-10-CM

## 2021-04-13 DIAGNOSIS — I890 Lymphedema, not elsewhere classified: Secondary | ICD-10-CM | POA: Diagnosis not present

## 2021-04-13 DIAGNOSIS — E038 Other specified hypothyroidism: Secondary | ICD-10-CM

## 2021-04-13 DIAGNOSIS — E782 Mixed hyperlipidemia: Secondary | ICD-10-CM | POA: Diagnosis not present

## 2021-04-13 DIAGNOSIS — Z794 Long term (current) use of insulin: Secondary | ICD-10-CM

## 2021-04-13 LAB — POCT URINALYSIS DIPSTICK
Bilirubin, UA: NEGATIVE
Glucose, UA: POSITIVE — AB
Ketones, UA: NEGATIVE
Nitrite, UA: NEGATIVE
Protein, UA: POSITIVE — AB
Spec Grav, UA: 1.02 (ref 1.010–1.025)
Urobilinogen, UA: 0.2 E.U./dL
pH, UA: 6 (ref 5.0–8.0)

## 2021-04-13 MED ORDER — SULFAMETHOXAZOLE-TRIMETHOPRIM 800-160 MG PO TABS: 1.0000 | ORAL_TABLET | Freq: Two times a day (BID) | ORAL | 0 refills | Status: DC

## 2021-04-13 NOTE — Addendum Note (Signed)
Addended by: Laretta Bolster on: 04/13/2021 10:06 AM   Modules accepted: Orders

## 2021-04-13 NOTE — Assessment & Plan Note (Signed)
-  has seen PT 3x -he reports doing much better

## 2021-04-13 NOTE — Progress Notes (Signed)
Established Patient Office Visit  Subjective:  Patient ID: Michael [name], male    DOB: Nov 13, 1945  Age: 75 y.o. MRN: [id number]  CC:  Chief Complaint  Patient presents with   Edema    Follow up, he says it has gotten much better since last visit.    Dysuria    Ongoing x1 week    HPI [name] presents for leg swelling and UTI symptoms.  He c/o chronic lymphedema, for which his caregiver helps him with compression bandages. He is also on diuretics for it. He is wheelchair bound and has not been able to ambulate for about 5 years. At his last OV, we referred him for home PT, and he states this is much improved.  He has h/o chronic pain - neck, back and neuropathic leg pain. He is followed by pain clinic. He is on chronic opiate therapy.   He has a caregiver through [place] system, who comes about 6 hours/week. He is dependent for ADLs and IADLs.  He is on Entresto, Coreg and diuretics. He follows up with Cardiologist for his CHF. He denies any chest pain, dyspnea or palpitations.  Past Medical History:  Diagnosis Date   Arthritis    Congestive heart disease ([place]) 11/2015   COPD (chronic obstructive pulmonary disease) (HCC)    DDD (degenerative disc disease), lumbosacral    Demand ischemia ([place]) 09/23/2019   Depression    Diabetic neuropathy (HCC)    Diastolic dysfunction    Essential hypertension    GERD (gastroesophageal reflux disease)    Hyperlipidemia    Iron deficiency anemia    Left knee DJD    Lumbar spinal stenosis    Lymphedema 11/2015   BOTH LEGS   NASH (nonalcoholic steatohepatitis)    Peripheral edema    Peripheral edema 10/05/2015   Type 2 diabetes mellitus ([place])     Past Surgical History:  Procedure Laterality Date   BIOPSY N/A 07/21/2014   Procedure: BIOPSY;  Surgeon: Daneil Dolin, MD;  Location: [facility];  Service: Endoscopy;  Laterality: N/A;   CATARACT EXTRACTION W/PHACO Right 07/08/2016   Procedure: CATARACT EXTRACTION PHACO AND INTRAOCULAR LENS  PLACEMENT RIGHT EYE;  Surgeon: Tonny Branch, MD;  Location: [facility];  Service: Ophthalmology;  Laterality: Right;  CDE: 9.01   CATARACT EXTRACTION W/PHACO Left 07/22/2016   Procedure: CATARACT EXTRACTION PHACO AND INTRAOCULAR LENS PLACEMENT LEFT EYE CDE=10.69;  Surgeon: Tonny Branch, MD;  Location: [facility];  Service: Ophthalmology;  Laterality: Left;  left -    COLONOSCOPY  2011   [facility]: normal colon, normal distal ileum   COLONOSCOPY WITH PROPOFOL N/A 07/21/2014   Procedure: ATTEMPTED COLONOSCOPY WITH PROPOFOL-HAD TO STOP DUE TO BRADYCARDIA;  Surgeon: Daneil Dolin, MD;  Location: [facility];  Service: Endoscopy;  Laterality: N/A;   EGD with enteroscopy  2011   [facility]: normal esophagus and stomach. Normal duodenum, jejunum. No evidence of AVMs.    ESOPHAGOGASTRODUODENOSCOPY (EGD) WITH PROPOFOL N/A 07/21/2014   Procedure: ESOPHAGOGASTRODUODENOSCOPY (EGD) WITH PROPOFOL;  Surgeon: Daneil Dolin, MD;  Location: [facility];  Service: Endoscopy;  Laterality: N/A;   HERNIA REPAIR     KNEE ARTHROSCOPY WITH MEDIAL MENISECTOMY Left 11/06/2012   Procedure: KNEE ARTHROSCOPY WITH MEDIAL MENISECTOMY;  Surgeon: Carole Civil, MD;  Location: [facility];  Service: Orthopedics;  Laterality: Left;   Lipoma removal     Stomach   LUMBAR LAMINECTOMY/DECOMPRESSION MICRODISCECTOMY Left 08/30/2013   Procedure: LUMBAR LAMINECTOMY/DECOMPRESSION MICRODISCECTOMY LEFT  LUMBAR  TWO THREE;  Surgeon: Otilio Connors, MD;  Location: [place] [place] [facility];  Service: Neurosurgery;  Laterality: Left;   RIGHT/LEFT HEART CATH AND CORONARY ANGIOGRAPHY N/A 09/22/2019   Procedure: RIGHT/LEFT HEART CATH AND CORONARY ANGIOGRAPHY;  Surgeon: Martinique, Peter M, MD;  Location: [facility];  Service: Cardiovascular;  Laterality: N/A;   SHOULDER SURGERY     Rght-rotator cuff   TOOTH EXTRACTION      Family History  Problem Relation Age of Onset   Heart disease Mother    Hyperlipidemia Mother    Hypertension Mother    Depression Mother    Diabetes  Mother    Rectal cancer Mother    Cancer Mother    Heart disease Father    Hypertension Father    Hyperlipidemia Father    Diabetes Father    Cancer Father    Heart disease Sister    Hyperlipidemia Sister    Hypertension Sister    Diabetes Sister    Diabetes Brother    Heart disease Sister    Hyperlipidemia Sister    Hypertension Sister    Heart disease Sister    Hyperlipidemia Sister    Hypertension Sister    Diabetes Sister    Diabetes Brother     Social History   Socioeconomic History   Marital status: Widowed    Spouse name: Not on file   Number of children: Not on file   Years of education: 12   Highest education level: Not on file  Occupational History   Not on file  Tobacco Use   Smoking status: Former    Packs/day: 2.50    Years: 44.00    Pack years: 110.00    Types: Cigarettes    Start date: 05/07/1955    Quit date: 10/01/1999    Years since quitting: 21.5   Smokeless tobacco: Never  Vaping Use   Vaping Use: Never used  Substance and Sexual Activity   Alcohol use: No    Alcohol/week: 0.0 standard drinks   Drug use: No   Sexual activity: Yes    Birth control/protection: None  Other Topics Concern   Not on file  Social History Narrative   Not on file   Social Determinants of Health   Financial Resource Strain: Not on file  Food Insecurity: Not on file  Transportation Needs: Not on file  Physical Activity: Not on file  Stress: Not on file  Social Connections: Not on file  Intimate Partner Violence: Not on file    Outpatient Medications Prior to Visit  Medication Sig Dispense Refill   allopurinol (ZYLOPRIM) 100 MG tablet Take 100 mg by mouth daily.     aspirin EC 81 MG tablet Take 81 mg by mouth daily.     baclofen (LIORESAL) 10 MG tablet Take 5 mg by mouth 3 (three) times daily as needed.     bumetanide (BUMEX) 2 MG tablet Take 1.5 tablets (3 mg total) by mouth 2 (two) times daily. 270 tablet 3   carvedilol (COREG) 12.5 MG tablet Take 1  tablet (12.5 mg total) by mouth 2 (two) times daily. 180 tablet 3   diclofenac sodium (VOLTAREN) 1 % GEL Apply 4 g topically 4 (four) times daily as needed (pain). 100 g 3   Ferrous Sulfate (IRON) 325 (65 FE) MG TABS Take 1 tablet by mouth daily.      gabapentin (NEURONTIN) 400 MG capsule Take 1 capsule (400 mg total) by mouth 3 (three) times daily. 90 capsule  0   HYDROcodone-acetaminophen (NORCO) 10-325 MG tablet Take 1 tablet by mouth 3 (three) times daily as needed.     Insulin Glargine (LANTUS SOLOSTAR) 100 UNIT/ML Solostar Pen Inject 5 Units into the skin daily at 10 pm. 15 mL 0   levothyroxine (SYNTHROID) 112 MCG tablet TAKE 1 TABLET BY MOUTH ONCE DAILY. 30 tablet 0   magnesium oxide (MAG-OX) 400 MG tablet Take 1 tablet (400 mg total) by mouth 2 (two) times daily. 60 tablet 11   metFORMIN (GLUCOPHAGE) 1000 MG tablet TAKE ONE TABLET BY MOUTH TWICE DAILY WITH A MEAL. 180 tablet 3   metolazone (ZAROXOLYN) 2.5 MG tablet Two Times Weekly on Monday and Thursday 10 tablet 11   nystatin cream (MYCOSTATIN) APPLY TO AFFECTED AREA TWICE DAILY. 30 g 0   oxyCODONE-Acetaminophen 10-300 MG/5ML SOLN TAKE 7.5MG BY MOUTH FOUR TIMES A DAY     pantoprazole (PROTONIX) 40 MG tablet Take 1 tablet (40 mg total) by mouth daily. 30 tablet 11   potassium chloride (K-DUR) 10 MEQ tablet Take 1 tablet (10 mEq total) by mouth 2 (two) times daily. 180 tablet 3   pravastatin (PRAVACHOL) 40 MG tablet TAKE ONE TABLET BY MOUTH DAILY. 90 tablet 0   sacubitril-valsartan (ENTRESTO) 49-51 MG Take 1 tablet by mouth 2 (two) times daily. 60 tablet 11   tiZANidine (ZANAFLEX) 2 MG tablet Take 1 tablet (2 mg total) by mouth every 8 (eight) hours as needed for muscle spasms. 30 tablet 0   traZODone (DESYREL) 100 MG tablet TAKE 1 TABLET BY MOUTH AT BEDTIME AS NEEDED FOR SLEEP. 90 tablet 2   triamcinolone cream (KENALOG) 0.1 % Apply 1 application topically 2 (two) times daily. To arms/hand 30 g 2   Vitamin D, Ergocalciferol, (DRISDOL) 1.25  MG (50000 UNIT) CAPS capsule Take 1 capsule (50,000 Units total) by mouth every 7 (seven) days. x12 weeks, then D/C. 12 capsule 0   XTAMPZA ER 9 MG C12A Take 1 capsule by mouth 2 (two) times daily.     No facility-administered medications prior to visit.    No Known Allergies  ROS Review of Systems  Constitutional: Negative.   Cardiovascular:  Positive for palpitations.  Gastrointestinal:  Positive for abdominal pain.       Lower abdominal pain  Genitourinary:  Positive for dysuria, flank pain and frequency.     Objective:    Physical Exam Constitutional:      Appearance: Normal appearance. He is obese.  Cardiovascular:     Rate and Rhythm: Normal rate and regular rhythm.     Pulses: Normal pulses.     Heart sounds: Normal heart sounds.  Pulmonary:     Effort: Pulmonary effort is normal.     Breath sounds: Normal breath sounds.  Neurological:     Mental Status: He is alert.  Psychiatric:        Mood and Affect: Mood normal.        Behavior: Behavior normal.        Thought Content: Thought content normal.        Judgment: Judgment normal.    BP (!) 185/95 (BP Location: Right Arm, Patient Position: Sitting, Cuff Size: Normal)   Pulse 89   Temp 97.6 F (36.4 C) (Temporal)   Ht 4' 11"  (1.499 m)   Wt 215 lb (97.5 kg)   SpO2 95%   BMI 43.42 kg/m  Wt Readings from Last 3 Encounters:  04/13/21 215 lb (97.5 kg)  03/05/21 215 lb (97.5 kg)  01/11/21 218 lb 6.4 oz (99.1 kg)     Health Maintenance Due  Topic Date Due   PNA vac Low Risk Adult (2 of 2 - PCV13) 04/13/2015   HEMOGLOBIN A1C  01/02/2021   OPHTHALMOLOGY EXAM  02/02/2021    There are no preventive care reminders to display for this patient.  Lab Results  Component Value Date   TSH 5.29 (H) 07/04/2020   Lab Results  Component Value Date   WBC 3.7 (L) 07/04/2020   HGB 11.5 (L) 07/04/2020   HCT 35.5 (L) 07/04/2020   MCV 84.1 07/04/2020   PLT 179 07/04/2020   Lab Results  Component Value Date   NA  138 07/04/2020   K 4.7 07/04/2020   CO2 26 07/04/2020   GLUCOSE 173 (H) 07/04/2020   BUN 12 07/04/2020   CREATININE 0.82 07/04/2020   BILITOT 0.4 07/04/2020   ALKPHOS 72 09/20/2019   AST 16 07/04/2020   ALT 18 07/04/2020   PROT 6.0 (L) 07/04/2020   ALBUMIN 3.9 09/20/2019   CALCIUM 9.0 07/04/2020   ANIONGAP 10 01/25/2020   Lab Results  Component Value Date   CHOL 148 04/10/2020   Lab Results  Component Value Date   HDL 42 04/10/2020   Lab Results  Component Value Date   LDLCALC 82 04/10/2020   Lab Results  Component Value Date   TRIG 137 04/10/2020   Lab Results  Component Value Date   CHOLHDL 3.5 04/10/2020   Lab Results  Component Value Date   HGBA1C 7.1 (H) 07/04/2020      Assessment & Plan:   Problem List Items Addressed This Visit       Cardiovascular and Mediastinum   Hypertension - Primary    -BP elevated today, but he states he has taken none of his antihypertensives today       Relevant Orders   CBC with Differential/Platelet   CMP14+EGFR   Lipid Panel With LDL/HDL Ratio     Endocrine   Hypothyroidism    -checking labs today -was seeing Dr. Buelah Manis previously        Relevant Orders   TSH + free T4   Diabetic peripheral neuropathy ([place])    Lab Results  Component Value Date   HGBA1C 7.1 (H) 07/04/2020  -checking A1c with labs -taking pravastatin and entresto      RESOLVED: DM (diabetes mellitus) ([place])   Relevant Orders   Lipid Panel With LDL/HDL Ratio   Hemoglobin A1c     Genitourinary   UTI (urinary tract infection)    -reports flank pain -U/A today has large leuks -Rx. bactrimx1 week       Relevant Medications   sulfamethoxazole-trimethoprim (BACTRIM DS) 800-160 MG tablet   Other Relevant Orders   Urine Culture     Other   Hyperlipidemia   Relevant Orders   Lipid Panel With LDL/HDL Ratio   Lymphedema of lower extremity    -has seen PT 3x -he reports doing much better        Meds ordered this encounter   Medications   sulfamethoxazole-trimethoprim (BACTRIM DS) 800-160 MG tablet    Sig: Take 1 tablet by mouth 2 (two) times daily.    Dispense:  14 tablet    Refill:  0     Follow-up: Return in about 3 months (around 07/14/2021) for Lab follow-up (DM, CHF, HTN, hypothyroidism).    Noreene Larsson, NP

## 2021-04-13 NOTE — Assessment & Plan Note (Signed)
Lab Results  Component Value Date   HGBA1C 7.1 (H) 07/04/2020   -checking A1c with labs -taking pravastatin and entresto

## 2021-04-13 NOTE — Assessment & Plan Note (Signed)
-  reports flank pain -U/A today has large leuks -Rx. bactrimx1 week

## 2021-04-13 NOTE — Assessment & Plan Note (Signed)
-  BP elevated today, but he states he has taken none of his antihypertensives today

## 2021-04-13 NOTE — Assessment & Plan Note (Signed)
-  checking labs today -was seeing Dr. Buelah Manis previously

## 2021-04-13 NOTE — Patient Instructions (Signed)
Please have labs drawn today.

## 2021-04-14 LAB — CBC WITH DIFFERENTIAL/PLATELET
Basophils Absolute: 0.1 10*3/uL (ref 0.0–0.2)
Basos: 2 %
EOS (ABSOLUTE): 0.1 10*3/uL (ref 0.0–0.4)
Eos: 3 %
Hematocrit: 35.7 % — ABNORMAL LOW (ref 37.5–51.0)
Hemoglobin: 11.5 g/dL — ABNORMAL LOW (ref 13.0–17.7)
Immature Grans (Abs): 0 10*3/uL (ref 0.0–0.1)
Immature Granulocytes: 1 %
Lymphocytes Absolute: 1.1 10*3/uL (ref 0.7–3.1)
Lymphs: 26 %
MCH: 26.4 pg — ABNORMAL LOW (ref 26.6–33.0)
MCHC: 32.2 g/dL (ref 31.5–35.7)
MCV: 82 fL (ref 79–97)
Monocytes Absolute: 0.3 10*3/uL (ref 0.1–0.9)
Monocytes: 8 %
Neutrophils Absolute: 2.5 10*3/uL (ref 1.4–7.0)
Neutrophils: 60 %
Platelets: 179 10*3/uL (ref 150–450)
RBC: 4.35 x10E6/uL (ref 4.14–5.80)
RDW: 14.5 % (ref 11.6–15.4)
WBC: 4.2 10*3/uL (ref 3.4–10.8)

## 2021-04-14 LAB — HEMOGLOBIN A1C
Est. average glucose Bld gHb Est-mCnc: 163 mg/dL
Hgb A1c MFr Bld: 7.3 % — ABNORMAL HIGH (ref 4.8–5.6)

## 2021-04-14 LAB — TSH+FREE T4
Free T4: 1.38 ng/dL (ref 0.82–1.77)
TSH: 3.25 u[IU]/mL (ref 0.450–4.500)

## 2021-04-14 LAB — CMP14+EGFR
ALT: 25 [IU]/L (ref 0–44)
AST: 17 [IU]/L (ref 0–40)
Albumin/Globulin Ratio: 1.9 (ref 1.2–2.2)
Albumin: 4 g/dL (ref 3.7–4.7)
Alkaline Phosphatase: 71 [IU]/L (ref 44–121)
BUN/Creatinine Ratio: 14 (ref 10–24)
BUN: 11 mg/dL (ref 8–27)
Bilirubin Total: 0.3 mg/dL (ref 0.0–1.2)
CO2: 26 mmol/L (ref 20–29)
Calcium: 9.7 mg/dL (ref 8.6–10.2)
Chloride: 96 mmol/L (ref 96–106)
Creatinine, Ser: 0.77 mg/dL (ref 0.76–1.27)
Globulin, Total: 2.1 g/dL (ref 1.5–4.5)
Glucose: 125 mg/dL — ABNORMAL HIGH (ref 65–99)
Potassium: 4.9 mmol/L (ref 3.5–5.2)
Sodium: 136 mmol/L (ref 134–144)
Total Protein: 6.1 g/dL (ref 6.0–8.5)
eGFR: 95 mL/min/{1.73_m2}

## 2021-04-14 LAB — LIPID PANEL WITH LDL/HDL RATIO
Cholesterol, Total: 143 mg/dL (ref 100–199)
HDL: 49 mg/dL
LDL Chol Calc (NIH): 70 mg/dL (ref 0–99)
LDL/HDL Ratio: 1.4 ratio (ref 0.0–3.6)
Triglycerides: 137 mg/dL (ref 0–149)
VLDL Cholesterol Cal: 24 mg/dL (ref 5–40)

## 2021-04-15 LAB — URINE CULTURE

## 2021-04-16 ENCOUNTER — Encounter: Payer: Self-pay | Admitting: *Deleted

## 2021-04-16 NOTE — Progress Notes (Signed)
Urine culture was negative, showed only routine bacteria (nothing that causes infections). A1c is 7.3, and we will discuss that at the next visit.

## 2021-04-17 ENCOUNTER — Ambulatory Visit (INDEPENDENT_AMBULATORY_CARE_PROVIDER_SITE_OTHER): Payer: Medicare Other | Admitting: *Deleted

## 2021-04-17 ENCOUNTER — Telehealth: Payer: Self-pay | Admitting: Internal Medicine

## 2021-04-17 ENCOUNTER — Other Ambulatory Visit: Payer: Self-pay

## 2021-04-17 DIAGNOSIS — Z0000 Encounter for general adult medical examination without abnormal findings: Secondary | ICD-10-CM

## 2021-04-17 NOTE — Telephone Encounter (Signed)
Yes. Please continue that.

## 2021-04-17 NOTE — Patient Instructions (Signed)
Michael [name] , Thank you for taking time to come for your Medicare Wellness Visit. I appreciate your ongoing commitment to your health goals. Please review the following plan we discussed and let me know if I can assist you in the future.   Screening recommendations/referrals: Colonoscopy: Completed Due 07-21-24 Recommended yearly ophthalmology/optometry visit for glaucoma screening and checkup Recommended yearly dental visit for hygiene and checkup  Vaccinations: Influenza vaccine: Completed Next Due 04-30-21 Pneumococcal vaccine: Completed Tdap vaccine: Completed Next Due 07-05-2024 Shingles vaccine: Completed    Advanced directives: Patient has living will and healthcare power of attorney advised to bring copy to office  Conditions/risks identified: Hypertension, Diabetes  Next appointment: 1 Year   Preventive Care 74 Years and Older, Male Preventive care refers to lifestyle choices and visits with your health care provider that can promote health and wellness. What does preventive care include? A yearly physical exam. This is also called an annual well check. Dental exams once or twice a year. Routine eye exams. Ask your health care provider how often you should have your eyes checked. Personal lifestyle choices, including: Daily care of your teeth and gums. Regular physical activity. Eating a healthy diet. Avoiding tobacco and drug use. Limiting alcohol use. Practicing safe sex. Taking low doses of aspirin every day. Taking vitamin and mineral supplements as recommended by your health care provider. What happens during an annual well check? The services and screenings done by your health care provider during your annual well check will depend on your age, overall health, lifestyle risk factors, and family history of disease. Counseling  Your health care provider may ask you questions about your: Alcohol use. Tobacco use. Drug use. Emotional well-being. Home and  relationship well-being. Sexual activity. Eating habits. History of falls. Memory and ability to understand (cognition). Work and work Statistician. Screening  You may have the following tests or measurements: Height, weight, and BMI. Blood pressure. Lipid and cholesterol levels. These may be checked every 5 years, or more frequently if you are over 32 years old. Skin check. Lung cancer screening. You may have this screening every year starting at age 74 if you have a 30-pack-year history of smoking and currently smoke or have quit within the past 15 years. Fecal occult blood test (FOBT) of the stool. You may have this test every year starting at age 32. Flexible sigmoidoscopy or colonoscopy. You may have a sigmoidoscopy every 5 years or a colonoscopy every 10 years starting at age 26. Prostate cancer screening. Recommendations will vary depending on your family history and other risks. Hepatitis C blood test. Hepatitis B blood test. Sexually transmitted disease (STD) testing. Diabetes screening. This is done by checking your blood sugar (glucose) after you have not eaten for a while (fasting). You may have this done every 1-3 years. Abdominal aortic aneurysm (AAA) screening. You may need this if you are a current or former smoker. Osteoporosis. You may be screened starting at age 1 if you are at high risk. Talk with your health care provider about your test results, treatment options, and if necessary, the need for more tests. Vaccines  Your health care provider may recommend certain vaccines, such as: Influenza vaccine. This is recommended every year. Tetanus, diphtheria, and acellular pertussis (Tdap, Td) vaccine. You may need a Td booster every 10 years. Zoster vaccine. You may need this after age 74. Pneumococcal 13-valent conjugate (PCV13) vaccine. One dose is recommended after age 21. Pneumococcal polysaccharide (PPSV23) vaccine. One dose is recommended after age  74. Talk to your  health care provider about which screenings and vaccines you need and how often you need them. This information is not intended to replace advice given to you by your health care provider. Make sure you discuss any questions you have with your health care provider. Document Released: 10/13/2015 Document Revised: 06/05/2016 Document Reviewed: 07/18/2015 Elsevier Interactive Patient Education  2017 [employer] Prevention in the Home Falls can cause injuries. They can happen to people of all ages. There are many things you can do to make your home safe and to help prevent falls. What can I do on the outside of my home? Regularly fix the edges of walkways and driveways and fix any cracks. Remove anything that might make you trip as you walk through a door, such as a raised step or threshold. Trim any bushes or trees on the path to your home. Use bright outdoor lighting. Clear any walking paths of anything that might make someone trip, such as rocks or tools. Regularly check to see if handrails are loose or broken. Make sure that both sides of any steps have handrails. Any raised decks and porches should have guardrails on the edges. Have any leaves, snow, or ice cleared regularly. Use sand or salt on walking paths during winter. Clean up any spills in your garage right away. This includes oil or grease spills. What can I do in the bathroom? Use night lights. Install grab bars by the toilet and in the tub and shower. Do not use towel bars as grab bars. Use non-skid mats or decals in the tub or shower. If you need to sit down in the shower, use a plastic, non-slip stool. Keep the floor dry. Clean up any water that spills on the floor as soon as it happens. Remove soap buildup in the tub or shower regularly. Attach bath mats securely with double-sided non-slip rug tape. Do not have throw rugs and other things on the floor that can make you trip. What can I do in the bedroom? Use night  lights. Make sure that you have a light by your bed that is easy to reach. Do not use any sheets or blankets that are too big for your bed. They should not hang down onto the floor. Have a firm chair that has side arms. You can use this for support while you get dressed. Do not have throw rugs and other things on the floor that can make you trip. What can I do in the kitchen? Clean up any spills right away. Avoid walking on wet floors. Keep items that you use a lot in easy-to-reach places. If you need to reach something above you, use a strong step stool that has a grab bar. Keep electrical cords out of the way. Do not use floor polish or wax that makes floors slippery. If you must use wax, use non-skid floor wax. Do not have throw rugs and other things on the floor that can make you trip. What can I do with my stairs? Do not leave any items on the stairs. Make sure that there are handrails on both sides of the stairs and use them. Fix handrails that are broken or loose. Make sure that handrails are as long as the stairways. Check any carpeting to make sure that it is firmly attached to the stairs. Fix any carpet that is loose or worn. Avoid having throw rugs at the top or bottom of the stairs. If you do have  throw rugs, attach them to the floor with carpet tape. Make sure that you have a light switch at the top of the stairs and the bottom of the stairs. If you do not have them, ask someone to add them for you. What else can I do to help prevent falls? Wear shoes that: Do not have high heels. Have rubber bottoms. Are comfortable and fit you well. Are closed at the toe. Do not wear sandals. If you use a stepladder: Make sure that it is fully opened. Do not climb a closed stepladder. Make sure that both sides of the stepladder are locked into place. Ask someone to hold it for you, if possible. Clearly mark and make sure that you can see: Any grab bars or handrails. First and last  steps. Where the edge of each step is. Use tools that help you move around (mobility aids) if they are needed. These include: Canes. Walkers. Scooters. Crutches. Turn on the lights when you go into a dark area. Replace any light bulbs as soon as they burn out. Set up your furniture so you have a clear path. Avoid moving your furniture around. If any of your floors are uneven, fix them. If there are any pets around you, be aware of where they are. Review your medicines with your doctor. Some medicines can make you feel dizzy. This can increase your chance of falling. Ask your doctor what other things that you can do to help prevent falls. This information is not intended to replace advice given to you by your health care provider. Make sure you discuss any questions you have with your health care provider. Document Released: 07/13/2009 Document Revised: 02/22/2016 Document Reviewed: 10/21/2014 Elsevier Interactive Patient Education  2017 [employer].

## 2021-04-17 NOTE — Telephone Encounter (Signed)
Please call Dorna Leitz regarding pt

## 2021-04-17 NOTE — Progress Notes (Signed)
Subjective:   Michael [name] is a 75 y.o. male who presents for Medicare Annual/Subsequent preventive examination.  Review of Systems           Objective:    There were no vitals filed for this visit. There is no height or weight on file to calculate BMI.  Advanced Directives 09/20/2019 08/03/2019 07/22/2016 07/08/2016 07/03/2016 05/09/2016 04/26/2016  Does Patient Have a Medical Advance Directive? Yes Yes Yes Yes Yes Yes Yes  Type of Advance Directive Healthcare Power of Attorney Living will;Healthcare Power of Attorney Living will Living will Living will Healthcare Power of [place]  Does patient want to make changes to medical advance directive? No - Guardian declined - No - Patient declined - - No - Patient declined No - Patient declined  Copy of [place] in Chart? - - No - copy requested No - copy requested No - copy requested Yes Yes  Would patient like information on creating a medical advance directive? - - - - - - -  Pre-existing out of facility DNR order (yellow form or pink MOST form) - - - - - - -    Current Medications (verified) Outpatient Encounter Medications as of 04/17/2021  Medication Sig   allopurinol (ZYLOPRIM) 100 MG tablet Take 100 mg by mouth daily.   aspirin EC 81 MG tablet Take 81 mg by mouth daily.   baclofen (LIORESAL) 10 MG tablet Take 5 mg by mouth 3 (three) times daily as needed.   bumetanide (BUMEX) 2 MG tablet Take 1.5 tablets (3 mg total) by mouth 2 (two) times daily.   carvedilol (COREG) 12.5 MG tablet Take 1 tablet (12.5 mg total) by mouth 2 (two) times daily.   diclofenac sodium (VOLTAREN) 1 % GEL Apply 4 g topically 4 (four) times daily as needed (pain).   Ferrous Sulfate (IRON) 325 (65 FE) MG TABS Take 1 tablet by mouth daily.    gabapentin (NEURONTIN) 400 MG capsule Take 1 capsule (400 mg total) by mouth 3 (three) times daily.   HYDROcodone-acetaminophen (NORCO) 10-325 MG tablet Take 1 tablet by  mouth 3 (three) times daily as needed.   Insulin Glargine (LANTUS SOLOSTAR) 100 UNIT/ML Solostar Pen Inject 5 Units into the skin daily at 10 pm.   levothyroxine (SYNTHROID) 112 MCG tablet TAKE 1 TABLET BY MOUTH ONCE DAILY.   magnesium oxide (MAG-OX) 400 MG tablet Take 1 tablet (400 mg total) by mouth 2 (two) times daily.   metFORMIN (GLUCOPHAGE) 1000 MG tablet TAKE ONE TABLET BY MOUTH TWICE DAILY WITH A MEAL.   metolazone (ZAROXOLYN) 2.5 MG tablet Two Times Weekly on Monday and Thursday   nystatin cream (MYCOSTATIN) APPLY TO AFFECTED AREA TWICE DAILY.   oxyCODONE-Acetaminophen 10-300 MG/5ML SOLN TAKE 7.5MG BY MOUTH FOUR TIMES A DAY   pantoprazole (PROTONIX) 40 MG tablet Take 1 tablet (40 mg total) by mouth daily.   potassium chloride (K-DUR) 10 MEQ tablet Take 1 tablet (10 mEq total) by mouth 2 (two) times daily.   pravastatin (PRAVACHOL) 40 MG tablet TAKE ONE TABLET BY MOUTH DAILY.   sacubitril-valsartan (ENTRESTO) 49-51 MG Take 1 tablet by mouth 2 (two) times daily.   sulfamethoxazole-trimethoprim (BACTRIM DS) 800-160 MG tablet Take 1 tablet by mouth 2 (two) times daily.   tiZANidine (ZANAFLEX) 2 MG tablet Take 1 tablet (2 mg total) by mouth every 8 (eight) hours as needed for muscle spasms.   traZODone (DESYREL) 100 MG tablet TAKE 1 TABLET BY MOUTH  AT BEDTIME AS NEEDED FOR SLEEP.   triamcinolone cream (KENALOG) 0.1 % Apply 1 application topically 2 (two) times daily. To arms/hand   Vitamin D, Ergocalciferol, (DRISDOL) 1.25 MG (50000 UNIT) CAPS capsule Take 1 capsule (50,000 Units total) by mouth every 7 (seven) days. x12 weeks, then D/C.   XTAMPZA ER 9 MG C12A Take 1 capsule by mouth 2 (two) times daily.   No facility-administered encounter medications on file as of 04/17/2021.    Allergies (verified) Patient has no known allergies.   History: Past Medical History:  Diagnosis Date   Arthritis    Congestive heart disease ([place]) 11/2015   COPD (chronic obstructive pulmonary disease)  (HCC)    DDD (degenerative disc disease), lumbosacral    Demand ischemia ([place]) 09/23/2019   Depression    Diabetic neuropathy (HCC)    Diastolic dysfunction    Essential hypertension    GERD (gastroesophageal reflux disease)    Hyperlipidemia    Iron deficiency anemia    Left knee DJD    Lumbar spinal stenosis    Lymphedema 11/2015   BOTH LEGS   NASH (nonalcoholic steatohepatitis)    Peripheral edema    Peripheral edema 10/05/2015   Type 2 diabetes mellitus ([place])    Past Surgical History:  Procedure Laterality Date   BIOPSY N/A 07/21/2014   Procedure: BIOPSY;  Surgeon: Daneil Dolin, MD;  Location: [facility];  Service: Endoscopy;  Laterality: N/A;   CATARACT EXTRACTION W/PHACO Right 07/08/2016   Procedure: CATARACT EXTRACTION PHACO AND INTRAOCULAR LENS PLACEMENT RIGHT EYE;  Surgeon: Tonny Branch, MD;  Location: [facility];  Service: Ophthalmology;  Laterality: Right;  CDE: 9.01   CATARACT EXTRACTION W/PHACO Left 07/22/2016   Procedure: CATARACT EXTRACTION PHACO AND INTRAOCULAR LENS PLACEMENT LEFT EYE CDE=10.69;  Surgeon: Tonny Branch, MD;  Location: [facility];  Service: Ophthalmology;  Laterality: Left;  left -    COLONOSCOPY  2011   [facility]: normal colon, normal distal ileum   COLONOSCOPY WITH PROPOFOL N/A 07/21/2014   Procedure: ATTEMPTED COLONOSCOPY WITH PROPOFOL-HAD TO STOP DUE TO BRADYCARDIA;  Surgeon: Daneil Dolin, MD;  Location: [facility];  Service: Endoscopy;  Laterality: N/A;   EGD with enteroscopy  2011   [facility]: normal esophagus and stomach. Normal duodenum, jejunum. No evidence of AVMs.    ESOPHAGOGASTRODUODENOSCOPY (EGD) WITH PROPOFOL N/A 07/21/2014   Procedure: ESOPHAGOGASTRODUODENOSCOPY (EGD) WITH PROPOFOL;  Surgeon: Daneil Dolin, MD;  Location: [facility];  Service: Endoscopy;  Laterality: N/A;   HERNIA REPAIR     KNEE ARTHROSCOPY WITH MEDIAL MENISECTOMY Left 11/06/2012   Procedure: KNEE ARTHROSCOPY WITH MEDIAL MENISECTOMY;  Surgeon: Carole Civil, MD;  Location: [facility];   Service: Orthopedics;  Laterality: Left;   Lipoma removal     Stomach   LUMBAR LAMINECTOMY/DECOMPRESSION MICRODISCECTOMY Left 08/30/2013   Procedure: LUMBAR LAMINECTOMY/DECOMPRESSION MICRODISCECTOMY LEFT  LUMBAR TWO THREE;  Surgeon: Otilio Connors, MD;  Location: [place] [facility];  Service: Neurosurgery;  Laterality: Left;   RIGHT/LEFT HEART CATH AND CORONARY ANGIOGRAPHY N/A 09/22/2019   Procedure: RIGHT/LEFT HEART CATH AND CORONARY ANGIOGRAPHY;  Surgeon: Martinique, Peter M, MD;  Location: [place] [facility];  Service: Cardiovascular;  Laterality: N/A;   SHOULDER SURGERY     Rght-rotator cuff   TOOTH EXTRACTION     Family History  Problem Relation Age of Onset   Heart disease Mother    Hyperlipidemia Mother    Hypertension Mother    Depression Mother    Diabetes Mother  Rectal cancer Mother    Cancer Mother    Heart disease Father    Hypertension Father    Hyperlipidemia Father    Diabetes Father    Cancer Father    Heart disease Sister    Hyperlipidemia Sister    Hypertension Sister    Diabetes Sister    Diabetes Brother    Heart disease Sister    Hyperlipidemia Sister    Hypertension Sister    Heart disease Sister    Hyperlipidemia Sister    Hypertension Sister    Diabetes Sister    Diabetes Brother    Social History   Socioeconomic History   Marital status: Widowed    Spouse name: Not on file   Number of children: Not on file   Years of education: 12   Highest education level: Not on file  Occupational History   Not on file  Tobacco Use   Smoking status: Former    Packs/day: 2.50    Years: 44.00    Pack years: 110.00    Types: Cigarettes    Start date: 05/07/1955    Quit date: 10/01/1999    Years since quitting: 21.5   Smokeless tobacco: Never  Vaping Use   Vaping Use: Never used  Substance and Sexual Activity   Alcohol use: No    Alcohol/week: 0.0 standard drinks   Drug use: No   Sexual activity: Yes    Birth control/protection: None  Other Topics Concern    Not on file  Social History Narrative   Not on file   Social Determinants of Health   Financial Resource Strain: Not on file  Food Insecurity: Not on file  Transportation Needs: Not on file  Physical Activity: Not on file  Stress: Not on file  Social Connections: Not on file    Tobacco Counseling Counseling given: Not Answered   Clinical Intake:                 Diabetic?No         Activities of Daily Living In your present state of health, do you have any difficulty performing the following activities: 01/11/2021  Hearing? N  Vision? N  Difficulty concentrating or making decisions? N  Walking or climbing stairs? Y  Dressing or bathing? Y  Doing errands, shopping? Y  Some recent data might be hidden    Patient Care Team: Lindell Spar, MD as PCP - General (Internal Medicine) Harl Bowie Alphonse Guild, MD as PCP - Cardiology (Cardiology) Carole Civil, MD as Referring Physician (Orthopedic Surgery) Edythe Clarity, [facility] as Pharmacist (Pharmacist)  Indicate any recent Medical Services you may have received from other than [facility] providers in the past year (date may be approximate).     Assessment:   This is a routine wellness examination for Ludwig.  Hearing/Vision screen No results found.  Dietary issues and exercise activities discussed:     Goals Addressed   None   Depression Screen PHQ 2/9 Scores 04/13/2021 01/11/2021 12/07/2019 08/03/2019 11/18/2018 08/17/2018 06/26/2018  PHQ - 2 Score 0 0 0 0 4 6 6   PHQ- 9 Score - 0 - - 15 15 20     Fall Risk Fall Risk  04/13/2021 01/11/2021 12/07/2019 08/03/2019 11/18/2018  Falls in the past year? 0 0 0 0 0  Number falls in past yr: 0 0 - 0 -  Injury with Fall? 0 0 - - -  Risk Factor Category  - - - - -  Risk for  fall due to : No Fall Risks No Fall Risks No Fall Risks - -  Follow up Falls evaluation completed Falls evaluation completed Falls evaluation completed Falls evaluation completed Falls evaluation  completed    [place]:  Any stairs in or around the home? No If so, are there any without handrails? No  Home free of loose throw rugs in walkways, pet beds, electrical cords, etc? Yes  Adequate lighting in your home to reduce risk of falls? Yes   ASSISTIVE DEVICES UTILIZED TO PREVENT FALLS:  Life alert? Yes  Use of a cane, walker or w/c? Yes  Grab bars in the bathroom? Yes  Shower chair or bench in shower? Yes  Elevated toilet seat or a handicapped toilet? Yes   TIMED UP AND GO:  Was the test performed? No .  Length of time to ambulate 10 feet: NA sec.     Cognitive Function:        Immunizations Immunization History  Administered Date(s) Administered   Fluad Quad(high Dose 65+) 08/03/2019, 07/04/2020   Hep A / Hep B 07/13/2014   Influenza Split 06/11/2012   Influenza, High Dose Seasonal PF 06/20/2017, 07/08/2017, 06/26/2018   Influenza,inj,Quad PF,6+ Mos 07/14/2013, 06/15/2014, 06/09/2015, 07/30/2016   Influenza-Unspecified 07/07/2008, 09/27/2009, 07/14/2010, 06/25/2011, 06/15/2014, 06/09/2015, 07/30/2016, 07/01/2019, 06/30/2020   Moderna Sars-Covid-2 Vaccination 11/17/2019, 12/15/2019, 08/18/2020   Pneumococcal Conjugate-13 04/12/2014   Pneumococcal Polysaccharide-23 06/11/2012, 04/12/2014   Pneumococcal-Unspecified 07/07/2008   Tdap 07/05/2014   Zoster Recombinat (Shingrix) 10/21/2017, 10/21/2017, 08/03/2019    TDAP status: Up to date  Flu Vaccine status: Up to date  Pneumococcal vaccine status: Up to date  Covid-19 vaccine status: Completed vaccines  Qualifies for Shingles Vaccine? Yes   Zostavax completed Yes   Shingrix Completed?: Yes  Screening Tests Health Maintenance  Topic Date Due   PNA vac Low Risk Adult (2 of 2 - PCV13) 04/13/2015   OPHTHALMOLOGY EXAM  02/02/2021   INFLUENZA VACCINE  04/30/2021   COVID-19 Vaccine (5 - Booster for Moderna series) 07/20/2021   HEMOGLOBIN A1C  10/14/2021   FOOT EXAM   01/11/2022   TETANUS/TDAP  07/05/2024   COLONOSCOPY (Pts 45-41yr Insurance coverage will need to be confirmed)  07/21/2024   Hepatitis C Screening  Completed   Zoster Vaccines- Shingrix  Completed   HPV VACCINES  Aged Out    Health Maintenance  Health Maintenance Due  Topic Date Due   PNA vac Low Risk Adult (2 of 2 - PCV13) 04/13/2015   OPHTHALMOLOGY EXAM  02/02/2021    Colorectal cancer screening: Type of screening: Colonoscopy. Completed 07-21-2014. Repeat every 10 years  Lung Cancer Screening: (Low Dose CT Chest recommended if Age 74-80years, 30 pack-year currently smoking OR have quit w/in 15years.) does not qualify.   Lung Cancer Screening Referral: NA  Additional Screening:  Hepatitis C Screening: does qualify; Completed 06-29-14  Vision Screening: Recommended annual ophthalmology exams for early detection of glaucoma and other disorders of the eye. Is the patient up to date with their annual eye exam?  No  Who is the provider or what is the name of the office in which the patient attends annual eye exams? [facility] If pt is not established with a provider, would they like to be referred to a provider to establish care? No .   Dental Screening: Recommended annual dental exams for proper oral hygiene  Community Resource Referral / Chronic Care Management: CRR required this visit?  No   CCM required this visit?  No      Plan:     I have personally reviewed and noted the following in the patient's chart:   Medical and social history Use of alcohol, tobacco or illicit drugs  Current medications and supplements including opioid prescriptions. Patient is currently taking opioid prescriptions. Information provided to patient regarding non-opioid alternatives. Patient advised to discuss non-opioid treatment plan with their provider. Functional ability and status Nutritional status Physical activity Advanced directives List of other  physicians Hospitalizations, surgeries, and ER visits in previous 12 months Vitals Screenings to include cognitive, depression, and falls Referrals and appointments  In addition, I have reviewed and discussed with patient certain preventive protocols, quality metrics, and best practice recommendations. A written personalized care plan for preventive services as well as general preventive health recommendations were provided to patient.     Shelda Altes, CMA   04/17/2021   Nurse Notes:

## 2021-04-17 NOTE — Telephone Encounter (Signed)
She says she noticed that the Bactrim he is on interacts with his Entresto, Potassium, and his Metformin. He only has 2 days left of the Bactrim and is not having any issues. Okay to continue? Please advise.

## 2021-04-18 NOTE — Telephone Encounter (Signed)
Michael [name] informed that I would call her if any changes were to be made.

## 2021-04-19 ENCOUNTER — Ambulatory Visit: Payer: Medicare Other | Admitting: Cardiology

## 2021-04-25 DIAGNOSIS — G894 Chronic pain syndrome: Secondary | ICD-10-CM | POA: Diagnosis not present

## 2021-04-25 DIAGNOSIS — E119 Type 2 diabetes mellitus without complications: Secondary | ICD-10-CM | POA: Diagnosis not present

## 2021-04-25 DIAGNOSIS — Z79899 Other long term (current) drug therapy: Secondary | ICD-10-CM | POA: Diagnosis not present

## 2021-04-25 DIAGNOSIS — M79606 Pain in leg, unspecified: Secondary | ICD-10-CM | POA: Diagnosis not present

## 2021-05-23 ENCOUNTER — Ambulatory Visit (INDEPENDENT_AMBULATORY_CARE_PROVIDER_SITE_OTHER): Payer: Medicare Other | Admitting: Internal Medicine

## 2021-05-23 ENCOUNTER — Other Ambulatory Visit: Payer: Self-pay

## 2021-05-23 ENCOUNTER — Telehealth: Payer: Self-pay

## 2021-05-23 ENCOUNTER — Encounter: Payer: Self-pay | Admitting: Internal Medicine

## 2021-05-23 VITALS — BP 172/82 | HR 90 | Temp 98.3°F | Ht 59.0 in | Wt 210.0 lb

## 2021-05-23 DIAGNOSIS — I5042 Chronic combined systolic (congestive) and diastolic (congestive) heart failure: Secondary | ICD-10-CM | POA: Diagnosis not present

## 2021-05-23 DIAGNOSIS — Z794 Long term (current) use of insulin: Secondary | ICD-10-CM | POA: Diagnosis not present

## 2021-05-23 DIAGNOSIS — N390 Urinary tract infection, site not specified: Secondary | ICD-10-CM | POA: Diagnosis not present

## 2021-05-23 DIAGNOSIS — E1142 Type 2 diabetes mellitus with diabetic polyneuropathy: Secondary | ICD-10-CM | POA: Diagnosis not present

## 2021-05-23 DIAGNOSIS — E559 Vitamin D deficiency, unspecified: Secondary | ICD-10-CM

## 2021-05-23 DIAGNOSIS — R300 Dysuria: Secondary | ICD-10-CM

## 2021-05-23 DIAGNOSIS — R319 Hematuria, unspecified: Secondary | ICD-10-CM

## 2021-05-23 DIAGNOSIS — D539 Nutritional anemia, unspecified: Secondary | ICD-10-CM | POA: Diagnosis not present

## 2021-05-23 DIAGNOSIS — R972 Elevated prostate specific antigen [PSA]: Secondary | ICD-10-CM

## 2021-05-23 LAB — POCT URINALYSIS DIPSTICK
Glucose, UA: NEGATIVE
Ketones, UA: NEGATIVE
Nitrite, UA: NEGATIVE
Protein, UA: POSITIVE — AB
Spec Grav, UA: 1.025 (ref 1.010–1.025)
Urobilinogen, UA: 1 E.U./dL
pH, UA: 7 (ref 5.0–8.0)

## 2021-05-23 MED ORDER — NITROFURANTOIN MONOHYD MACRO 100 MG PO CAPS: 100.0000 mg | ORAL_CAPSULE | Freq: Two times a day (BID) | ORAL | 0 refills | Status: DC

## 2021-05-23 NOTE — Assessment & Plan Note (Signed)
Has chronic lymphedema No dyspnea currently On Entresto, Coreg Bumenatide and Metolazone Follows up with Cardiology

## 2021-05-23 NOTE — Telephone Encounter (Signed)
Will fax previous lab and the current one once we receive results.

## 2021-05-23 NOTE — Telephone Encounter (Signed)
Patient signed a release and ask [place] to fax to [facility] his lab results from 04/13/2021 and 05/23/2021 to [phone].

## 2021-05-23 NOTE — Assessment & Plan Note (Signed)
UA reviewed - LE present Check urine culture Started Macrobid Recently had UTI, treated with Bactrim Needs to maintain adequate hydration If recurrent, will refer to Urology

## 2021-05-23 NOTE — Progress Notes (Signed)
Established Patient Office Visit  Subjective:  Patient ID: Michael [name], male    DOB: 10/15/1945  Age: 75 y.o. MRN: [id number]  CC:  Chief Complaint  Patient presents with   Dysuria    Ongoing x1-2 weeks.     HPI Michael [name] presents for c/o dysuria for 1-2 weeks. He was treated for UTI with Bactrim about a month ago. He denies any fever, chills, nausea, vomiting or hematuria. He admits that he fluid intake is too low as he is concerned about LE edema.  His HbA1C was 7.3 recently. He has been taking Lantus 25 U qHS and Metformin. Denies any polyphagia, but admits having fatigue, which could be due to UTI as well.  His LE edema is stable currently. He benefited from home PT. Continues to take Bumex and Metolazone.  He follows up with pain management and states that they requested Vitamin levels checked. Of note, he used to take Vitamin D supplements in the past, but has not had levels checked since then.  Past Medical History:  Diagnosis Date   Arthritis    Congestive heart disease ([place]) 11/2015   COPD (chronic obstructive pulmonary disease) (HCC)    DDD (degenerative disc disease), lumbosacral    Demand ischemia ([place]) 09/23/2019   Depression    Diabetic neuropathy (HCC)    Diastolic dysfunction    Essential hypertension    GERD (gastroesophageal reflux disease)    Hyperlipidemia    Iron deficiency anemia    Left knee DJD    Lumbar spinal stenosis    Lymphedema 11/2015   BOTH LEGS   NASH (nonalcoholic steatohepatitis)    Peripheral edema    Peripheral edema 10/05/2015   Type 2 diabetes mellitus ([place])     Past Surgical History:  Procedure Laterality Date   BIOPSY N/A 07/21/2014   Procedure: BIOPSY;  Surgeon: Daneil Dolin, MD;  Location: [facility];  Service: Endoscopy;  Laterality: N/A;   CATARACT EXTRACTION W/PHACO Right 07/08/2016   Procedure: CATARACT EXTRACTION PHACO AND INTRAOCULAR LENS PLACEMENT RIGHT EYE;  Surgeon: Tonny Branch, MD;  Location: [facility];  Service:  Ophthalmology;  Laterality: Right;  CDE: 9.01   CATARACT EXTRACTION W/PHACO Left 07/22/2016   Procedure: CATARACT EXTRACTION PHACO AND INTRAOCULAR LENS PLACEMENT LEFT EYE CDE=10.69;  Surgeon: Tonny Branch, MD;  Location: [facility];  Service: Ophthalmology;  Laterality: Left;  left -    COLONOSCOPY  2011   [facility]: normal colon, normal distal ileum   COLONOSCOPY WITH PROPOFOL N/A 07/21/2014   Procedure: ATTEMPTED COLONOSCOPY WITH PROPOFOL-HAD TO STOP DUE TO BRADYCARDIA;  Surgeon: Daneil Dolin, MD;  Location: [facility];  Service: Endoscopy;  Laterality: N/A;   EGD with enteroscopy  2011   [facility]: normal esophagus and stomach. Normal duodenum, jejunum. No evidence of AVMs.    ESOPHAGOGASTRODUODENOSCOPY (EGD) WITH PROPOFOL N/A 07/21/2014   Procedure: ESOPHAGOGASTRODUODENOSCOPY (EGD) WITH PROPOFOL;  Surgeon: Daneil Dolin, MD;  Location: [facility];  Service: Endoscopy;  Laterality: N/A;   HERNIA REPAIR     KNEE ARTHROSCOPY WITH MEDIAL MENISECTOMY Left 11/06/2012   Procedure: KNEE ARTHROSCOPY WITH MEDIAL MENISECTOMY;  Surgeon: Carole Civil, MD;  Location: [facility];  Service: Orthopedics;  Laterality: Left;   Lipoma removal     Stomach   LUMBAR LAMINECTOMY/DECOMPRESSION MICRODISCECTOMY Left 08/30/2013   Procedure: LUMBAR LAMINECTOMY/DECOMPRESSION MICRODISCECTOMY LEFT  LUMBAR TWO THREE;  Surgeon: Otilio Connors, MD;  Location: [facility];  Service: Neurosurgery;  Laterality: Left;  RIGHT/LEFT HEART CATH AND CORONARY ANGIOGRAPHY N/A 09/22/2019   Procedure: RIGHT/LEFT HEART CATH AND CORONARY ANGIOGRAPHY;  Surgeon: Martinique, Peter M, MD;  Location: [facility];  Service: Cardiovascular;  Laterality: N/A;   SHOULDER SURGERY     Rght-rotator cuff   TOOTH EXTRACTION      Family History  Problem Relation Age of Onset   Heart disease Mother    Hyperlipidemia Mother    Hypertension Mother    Depression Mother    Diabetes Mother    Rectal cancer Mother    Cancer Mother    Heart disease Father     Hypertension Father    Hyperlipidemia Father    Diabetes Father    Cancer Father    Heart disease Sister    Hyperlipidemia Sister    Hypertension Sister    Diabetes Sister    Diabetes Brother    Heart disease Sister    Hyperlipidemia Sister    Hypertension Sister    Heart disease Sister    Hyperlipidemia Sister    Hypertension Sister    Diabetes Sister    Diabetes Brother     Social History   Socioeconomic History   Marital status: Widowed    Spouse name: Not on file   Number of children: Not on file   Years of education: 12   Highest education level: Not on file  Occupational History   Not on file  Tobacco Use   Smoking status: Former    Packs/day: 2.50    Years: 44.00    Pack years: 110.00    Types: Cigarettes    Start date: 05/07/1955    Quit date: 10/01/1999    Years since quitting: 21.6   Smokeless tobacco: Never  Vaping Use   Vaping Use: Never used  Substance and Sexual Activity   Alcohol use: No    Alcohol/week: 0.0 standard drinks   Drug use: No   Sexual activity: Yes    Birth control/protection: None  Other Topics Concern   Not on file  Social History Narrative   Not on file   Social Determinants of Health   Financial Resource Strain: Low Risk    Difficulty of Paying Living Expenses: Not hard at all  Food Insecurity: No Food Insecurity   Worried About Charity fundraiser in the Last Year: Never true   [place] in the Last Year: Never true  Transportation Needs: No Transportation Needs   Lack of Transportation (Medical): No   Lack of Transportation (Non-Medical): No  Physical Activity: Insufficiently Active   Days of Exercise per Week: 4 days   Minutes of Exercise per Session: 30 min  Stress: No Stress Concern Present   Feeling of Stress : Not at all  Social Connections: Moderately Isolated   Frequency of Communication with Friends and Family: More than three times a week   Frequency of Social Gatherings with Friends and Family: More  than three times a week   Attends Religious Services: 1 to 4 times per year   Active Member of [employer] or Organizations: No   Attends Archivist Meetings: Never   Marital Status: Widowed  Human resources officer Violence: Not At Risk   Fear of Current or Ex-Partner: No   Emotionally Abused: No   Physically Abused: No   Sexually Abused: No    Outpatient Medications Prior to Visit  Medication Sig Dispense Refill   allopurinol (ZYLOPRIM) 100 MG tablet Take 100 mg by mouth daily.  aspirin EC 81 MG tablet Take 81 mg by mouth daily.     baclofen (LIORESAL) 10 MG tablet Take 5 mg by mouth 3 (three) times daily as needed.     bumetanide (BUMEX) 2 MG tablet Take 1.5 tablets (3 mg total) by mouth 2 (two) times daily. 270 tablet 3   carvedilol (COREG) 12.5 MG tablet Take 1 tablet (12.5 mg total) by mouth 2 (two) times daily. 180 tablet 3   diclofenac sodium (VOLTAREN) 1 % GEL Apply 4 g topically 4 (four) times daily as needed (pain). 100 g 3   Ferrous Sulfate (IRON) 325 (65 FE) MG TABS Take 1 tablet by mouth daily.      gabapentin (NEURONTIN) 400 MG capsule Take 1 capsule (400 mg total) by mouth 3 (three) times daily. 90 capsule 0   HYDROcodone-acetaminophen (NORCO) 10-325 MG tablet Take 1 tablet by mouth 3 (three) times daily as needed.     Insulin Glargine (LANTUS SOLOSTAR) 100 UNIT/ML Solostar Pen Inject 5 Units into the skin daily at 10 pm. 15 mL 0   levothyroxine (SYNTHROID) 112 MCG tablet TAKE 1 TABLET BY MOUTH ONCE DAILY. 30 tablet 0   magnesium oxide (MAG-OX) 400 MG tablet Take 1 tablet (400 mg total) by mouth 2 (two) times daily. 60 tablet 11   metFORMIN (GLUCOPHAGE) 1000 MG tablet TAKE ONE TABLET BY MOUTH TWICE DAILY WITH A MEAL. 180 tablet 3   metolazone (ZAROXOLYN) 2.5 MG tablet Two Times Weekly on Monday and Thursday 10 tablet 11   nystatin cream (MYCOSTATIN) APPLY TO AFFECTED AREA TWICE DAILY. 30 g 0   oxyCODONE-Acetaminophen 10-300 MG/5ML SOLN TAKE 7.5MG BY MOUTH FOUR TIMES A DAY      pantoprazole (PROTONIX) 40 MG tablet Take 1 tablet (40 mg total) by mouth daily. 30 tablet 11   potassium chloride (K-DUR) 10 MEQ tablet Take 1 tablet (10 mEq total) by mouth 2 (two) times daily. 180 tablet 3   pravastatin (PRAVACHOL) 40 MG tablet TAKE ONE TABLET BY MOUTH DAILY. 90 tablet 0   sacubitril-valsartan (ENTRESTO) 49-51 MG Take 1 tablet by mouth 2 (two) times daily. 60 tablet 11   tiZANidine (ZANAFLEX) 2 MG tablet Take 1 tablet (2 mg total) by mouth every 8 (eight) hours as needed for muscle spasms. 30 tablet 0   traZODone (DESYREL) 100 MG tablet TAKE 1 TABLET BY MOUTH AT BEDTIME AS NEEDED FOR SLEEP. 90 tablet 2   triamcinolone cream (KENALOG) 0.1 % Apply 1 application topically 2 (two) times daily. To arms/hand 30 g 2   Vitamin D, Ergocalciferol, (DRISDOL) 1.25 MG (50000 UNIT) CAPS capsule Take 1 capsule (50,000 Units total) by mouth every 7 (seven) days. x12 weeks, then D/C. 12 capsule 0   XTAMPZA ER 9 MG C12A Take 1 capsule by mouth 2 (two) times daily.     sulfamethoxazole-trimethoprim (BACTRIM DS) 800-160 MG tablet Take 1 tablet by mouth 2 (two) times daily. (Patient not taking: Reported on 05/23/2021) 14 tablet 0   No facility-administered medications prior to visit.    No Known Allergies  ROS Review of Systems  Constitutional:  Negative for chills and fever.  HENT:  Negative for congestion and sore throat.   Eyes:  Negative for pain and discharge.  Respiratory:  Negative for cough and shortness of breath.   Cardiovascular:  Positive for leg swelling. Negative for chest pain and palpitations.  Gastrointestinal:  Negative for constipation, diarrhea, nausea and vomiting.  Endocrine: Negative for polydipsia and polyuria.  Genitourinary:  Positive  for dysuria. Negative for hematuria.  Musculoskeletal:  Positive for arthralgias, back pain and neck pain. Negative for neck stiffness.  Skin:  Positive for color change.  Neurological:  Positive for numbness. Negative for  dizziness and headaches.  Psychiatric/Behavioral:  Negative for agitation and behavioral problems.      Objective:    Physical Exam Vitals reviewed.  Constitutional:      General: He is not in acute distress.    Appearance: He is obese. He is not diaphoretic.     Comments: In wheelchair  HENT:     Head: Normocephalic and atraumatic.     Nose: Nose normal.     Mouth/Throat:     Mouth: Mucous membranes are moist.  Eyes:     General: No scleral icterus.    Extraocular Movements: Extraocular movements intact.     Pupils: Pupils are equal, round, and reactive to light.  Cardiovascular:     Rate and Rhythm: Normal rate and regular rhythm.     Pulses: Normal pulses.     Heart sounds: Normal heart sounds. No murmur heard. Pulmonary:     Breath sounds: Normal breath sounds. No wheezing or rales.  Abdominal:     Palpations: Abdomen is soft.     Tenderness: There is no abdominal tenderness.  Musculoskeletal:     Cervical back: Neck supple. No tenderness.     Right lower leg: Edema (3+) present.     Left lower leg: Edema (3+) present.  Skin:    General: Skin is warm.     Findings: Erythema (Over b/l LE) present.  Neurological:     General: No focal deficit present.     Mental Status: He is alert and oriented to person, place, and time.  Psychiatric:        Mood and Affect: Mood normal.        Behavior: Behavior normal.    BP (!) 172/82 (BP Location: Left Arm, Patient Position: Sitting, Cuff Size: Large)   Pulse 90   Temp 98.3 F (36.8 C) (Oral)   Ht _0  (1.499 m)   Wt 210 lb (95.3 kg)   SpO2 94%   BMI 42.41 kg/m  Wt Readings from Last 3 Encounters:  05/23/21 210 lb (95.3 kg)  04/13/21 215 lb (97.5 kg)  03/05/21 215 lb (97.5 kg)     Health Maintenance Due  Topic Date Due   PNA vac Low Risk Adult (2 of 2 - PCV13) 04/13/2015   OPHTHALMOLOGY EXAM  02/02/2021   INFLUENZA VACCINE  04/30/2021    There are no preventive care reminders to display for this  patient.  Lab Results  Component Value Date   TSH 3.250 04/13/2021   Lab Results  Component Value Date   WBC 4.2 04/13/2021   HGB 11.5 (L) 04/13/2021   HCT 35.7 (L) 04/13/2021   MCV 82 04/13/2021   PLT 179 04/13/2021   Lab Results  Component Value Date   NA 136 04/13/2021   K 4.9 04/13/2021   CO2 26 04/13/2021   GLUCOSE 125 (H) 04/13/2021   BUN 11 04/13/2021   CREATININE 0.77 04/13/2021   BILITOT 0.3 04/13/2021   ALKPHOS 71 04/13/2021   AST 17 04/13/2021   ALT 25 04/13/2021   PROT 6.1 04/13/2021   ALBUMIN 4.0 04/13/2021   CALCIUM 9.7 04/13/2021   ANIONGAP 10 01/25/2020   EGFR 95 04/13/2021   Lab Results  Component Value Date   CHOL 143 04/13/2021   Lab Results  Component Value Date   HDL 49 04/13/2021   Lab Results  Component Value Date   LDLCALC 70 04/13/2021   Lab Results  Component Value Date   TRIG 137 04/13/2021   Lab Results  Component Value Date   CHOLHDL 3.5 04/10/2020   Lab Results  Component Value Date   HGBA1C 7.3 (H) 04/13/2021      Assessment & Plan:   Problem List Items Addressed This Visit       Cardiovascular and Mediastinum   Chronic combined systolic and diastolic heart failure ([place])    Has chronic lymphedema No dyspnea currently On Entresto, Coreg Bumenatide and Metolazone Follows up with Cardiology        Endocrine   Diabetes mellitus ([place])    Lab Results  Component Value Date   HGBA1C 7.3 (H) 04/13/2021  On Lantus 25 U qHS and Metformin On ARNI and statin Has diabetic neuropathy - on Gabapentin      Diabetic peripheral neuropathy (HCC)    On Gabapentin      Relevant Orders   B12     Genitourinary   UTI (urinary tract infection) - Primary    UA reviewed - LE present Check urine culture Started Macrobid Recently had UTI, treated with Bactrim Needs to maintain adequate hydration If recurrent, will refer to Urology      Relevant Medications   nitrofurantoin, macrocrystal-monohydrate, (MACROBID) 100  MG capsule   Other Relevant Orders   Urine Culture     Other   Vitamin D deficiency    Was on Vitamin D supplements Check Vitamin D Pain management also asked for Vitamin B12 checked, will order it today.      Relevant Orders   Vitamin D (25 hydroxy)   Other Visit Diagnoses     Dysuria       Relevant Orders   POCT Urinalysis Dipstick (Completed)   Urine Culture   Elevated PSA       Relevant Orders   PSA       Meds ordered this encounter  Medications   nitrofurantoin, macrocrystal-monohydrate, (MACROBID) 100 MG capsule    Sig: Take 1 capsule (100 mg total) by mouth 2 (two) times daily.    Dispense:  10 capsule    Refill:  0    Follow-up: Return if symptoms worsen or fail to improve.    Lindell Spar, MD

## 2021-05-23 NOTE — Assessment & Plan Note (Signed)
Was on Vitamin D supplements Check Vitamin D Pain management also asked for Vitamin B12 checked, will order it today.

## 2021-05-23 NOTE — Assessment & Plan Note (Signed)
On Gabapentin

## 2021-05-23 NOTE — Assessment & Plan Note (Signed)
Lab Results  Component Value Date   HGBA1C 7.3 (H) 04/13/2021   On Lantus 25 U qHS and Metformin On ARNI and statin Has diabetic neuropathy - on Gabapentin

## 2021-05-24 ENCOUNTER — Telehealth (HOSPITAL_COMMUNITY): Payer: Self-pay | Admitting: Physical Therapy

## 2021-05-24 ENCOUNTER — Other Ambulatory Visit: Payer: Self-pay | Admitting: Internal Medicine

## 2021-05-24 DIAGNOSIS — E559 Vitamin D deficiency, unspecified: Secondary | ICD-10-CM

## 2021-05-24 DIAGNOSIS — N390 Urinary tract infection, site not specified: Secondary | ICD-10-CM

## 2021-05-24 DIAGNOSIS — R972 Elevated prostate specific antigen [PSA]: Secondary | ICD-10-CM

## 2021-05-24 DIAGNOSIS — R319 Hematuria, unspecified: Secondary | ICD-10-CM

## 2021-05-24 LAB — VITAMIN D 25 HYDROXY (VIT D DEFICIENCY, FRACTURES): Vit D, 25-Hydroxy: 17.2 ng/mL — ABNORMAL LOW (ref 30.0–100.0)

## 2021-05-24 LAB — VITAMIN B12: Vitamin B-12: 368 pg/mL (ref 232–1245)

## 2021-05-24 LAB — PSA: Prostate Specific Ag, Serum: 6.6 ng/mL — ABNORMAL HIGH (ref 0.0–4.0)

## 2021-05-24 MED ORDER — VITAMIN D (ERGOCALCIFEROL) 1.25 MG (50000 UNIT) PO CAPS: 50000.0000 [IU] | ORAL_CAPSULE | ORAL | 1 refills | Status: DC

## 2021-05-24 NOTE — Telephone Encounter (Signed)
Continue note: . I have l/m with the Nurse Estella Husk [phone] to please correct the OT Lymph referral to PT referral. I have requested that she mail the referral to [place] due to the faxing issues. I also ask that she call us back with an update when this has been corrected.

## 2021-05-24 NOTE — Telephone Encounter (Signed)
S/w pt and - Spoke with call center at [facility]- they faxed the referral for OT LYMPH which is incorrect in July. Our fax machines are not receiving faxes from [place] at this time

## 2021-05-25 LAB — URINE CULTURE

## 2021-06-06 ENCOUNTER — Telehealth (HOSPITAL_COMMUNITY): Payer: Self-pay | Admitting: Physical Therapy

## 2021-06-06 NOTE — Telephone Encounter (Signed)
Request return phone call to get patient's referral and auth from the [place] - patient is wanting to schedule

## 2021-06-07 ENCOUNTER — Ambulatory Visit: Payer: Medicare Other | Admitting: Orthopedic Surgery

## 2021-06-19 ENCOUNTER — Ambulatory Visit: Payer: Medicare Other | Admitting: Urology

## 2021-06-21 DIAGNOSIS — M79606 Pain in leg, unspecified: Secondary | ICD-10-CM | POA: Diagnosis not present

## 2021-06-21 DIAGNOSIS — G894 Chronic pain syndrome: Secondary | ICD-10-CM | POA: Diagnosis not present

## 2021-06-21 DIAGNOSIS — Z79899 Other long term (current) drug therapy: Secondary | ICD-10-CM | POA: Diagnosis not present

## 2021-06-21 DIAGNOSIS — E119 Type 2 diabetes mellitus without complications: Secondary | ICD-10-CM | POA: Diagnosis not present

## 2021-06-21 DIAGNOSIS — R030 Elevated blood-pressure reading, without diagnosis of hypertension: Secondary | ICD-10-CM | POA: Diagnosis not present

## 2021-07-10 ENCOUNTER — Ambulatory Visit (INDEPENDENT_AMBULATORY_CARE_PROVIDER_SITE_OTHER): Payer: Medicare Other | Admitting: Internal Medicine

## 2021-07-10 ENCOUNTER — Other Ambulatory Visit: Payer: Self-pay

## 2021-07-10 ENCOUNTER — Encounter: Payer: Self-pay | Admitting: Internal Medicine

## 2021-07-10 VITALS — BP 195/96 | HR 96 | Resp 18 | Ht 59.0 in | Wt 217.0 lb

## 2021-07-10 DIAGNOSIS — I10 Essential (primary) hypertension: Secondary | ICD-10-CM | POA: Diagnosis not present

## 2021-07-10 DIAGNOSIS — I5042 Chronic combined systolic (congestive) and diastolic (congestive) heart failure: Secondary | ICD-10-CM

## 2021-07-10 DIAGNOSIS — R350 Frequency of micturition: Secondary | ICD-10-CM | POA: Diagnosis not present

## 2021-07-10 DIAGNOSIS — N401 Enlarged prostate with lower urinary tract symptoms: Secondary | ICD-10-CM

## 2021-07-10 DIAGNOSIS — Z794 Long term (current) use of insulin: Secondary | ICD-10-CM

## 2021-07-10 DIAGNOSIS — E1142 Type 2 diabetes mellitus with diabetic polyneuropathy: Secondary | ICD-10-CM

## 2021-07-10 DIAGNOSIS — Z23 Encounter for immunization: Secondary | ICD-10-CM | POA: Diagnosis not present

## 2021-07-10 DIAGNOSIS — E559 Vitamin D deficiency, unspecified: Secondary | ICD-10-CM

## 2021-07-10 DIAGNOSIS — N3000 Acute cystitis without hematuria: Secondary | ICD-10-CM | POA: Insufficient documentation

## 2021-07-10 LAB — POCT URINALYSIS DIP (CLINITEK)
Bilirubin, UA: NEGATIVE
Glucose, UA: NEGATIVE mg/dL
Ketones, POC UA: NEGATIVE mg/dL
Nitrite, UA: NEGATIVE
POC PROTEIN,UA: 30 — AB
Spec Grav, UA: 1.015 (ref 1.010–1.025)
Urobilinogen, UA: 0.2 E.U./dL
pH, UA: 7 (ref 5.0–8.0)

## 2021-07-10 MED ORDER — VITAMIN D (ERGOCALCIFEROL) 1.25 MG (50000 UNIT) PO CAPS: 50000.0000 [IU] | ORAL_CAPSULE | ORAL | 1 refills | Status: DC

## 2021-07-10 MED ORDER — TAMSULOSIN HCL 0.4 MG PO CAPS: 0.4000 mg | ORAL_CAPSULE | Freq: Every day | ORAL | 3 refills | Status: DC

## 2021-07-10 MED ORDER — SULFAMETHOXAZOLE-TRIMETHOPRIM 800-160 MG PO TABS: 1.0000 | ORAL_TABLET | Freq: Two times a day (BID) | ORAL | 0 refills | Status: DC

## 2021-07-10 NOTE — Assessment & Plan Note (Signed)
Has chronic nocturia and urinary frequency Started Tamsulosin Does not want to see Urology despite elevated PSA

## 2021-07-10 NOTE — Assessment & Plan Note (Signed)
Last vitamin D Lab Results  Component Value Date   VD25OH 17.2 (L) 05/23/2021   Started Vitamin D 50,000 IU qw

## 2021-07-10 NOTE — Assessment & Plan Note (Signed)
BP Readings from Last 1 Encounters:  07/10/21 (!) 195/96   Elevated today, has not had his medications today On Entresto and Coreg for HFrEF, also on diuretics- followed by Cardiology Counseled for compliance with the medications Advised DASH diet

## 2021-07-10 NOTE — Assessment & Plan Note (Signed)
Has had UTIs in the past UA reviewed, check urine culture Started Bactrim Had referred to Urology for elevated PSA and recurrent UTI, does not want to see Urology.

## 2021-07-10 NOTE — Patient Instructions (Signed)
Please start taking Bactrim for UTI.  Start taking Tamsulosin for urinary frequency.  Please start taking Vitamin D 50,000 once every week.  Please continue taking other medications as prescribed.

## 2021-07-10 NOTE — Assessment & Plan Note (Signed)
Has chronic lymphedema No dyspnea currently On Entresto, Coreg Bumenatide and Metolazone Follows up with Cardiology

## 2021-07-10 NOTE — Assessment & Plan Note (Signed)
Lab Results  Component Value Date   HGBA1C 7.3 (H) 04/13/2021   On Lantus 25 U qHS and Metformin, followed by [facility] clinic On ARNI and statin Has diabetic neuropathy - on Gabapentin

## 2021-07-10 NOTE — Progress Notes (Signed)
Established Patient Office Visit  Subjective:  Patient ID: ANTOINNE [name], male    DOB: Oct 20, 1945  Age: 75 y.o. MRN: [id number]  CC:  Chief Complaint  Patient presents with   Follow-up    3 month follow up pt has had frequent urination he had UTI before but doesn't think that antibiotic worked was not happy that urology referral was sent before discussing so he cancelled and he never got prescription for vit d that was due to be sent in previous lab discussion     HPI Michael [name] is a 75 year old male with PMH of HTN, HFrEF, DM with neuropathy, COPD, GERD, hypothyroidism, chronic pain syndrome and chronic lymphedema who presents for f/u of his chronic medical conditions.  His BP was elevated today.  He has not had his medications today. He is on Entresto, Coreg and diuretics. He follows up Cardiologist for his CHF. He denies any chest pain, dyspnea or palpitations.  He c/o chronic lymphedema, for which his caregiver helps him with compression bandages. He is also on diuretics for it. He is wheelchair bound and has not been able to ambulate for about 5 years. He is going to start PT for lymphedema.  His HbA1C was 7.3 recently. He has been taking Lantus 25 U qHS and Metformin. Denies any polyphagia, but admits having fatigue, which could be due to UTI as well.  He c/o dysuria and urinary frequency for 1-2 weeks. He was treated for UTI with Macrobid about a month ago. He denies any fever, chills, nausea, vomiting or hematuria. His PSA was elevated in the last blood tests, but he did not follow up with Urology as he does not want to go through workup.  Past Medical History:  Diagnosis Date   Arthritis    Congestive heart disease ([place]) 11/2015   COPD (chronic obstructive pulmonary disease) (HCC)    DDD (degenerative disc disease), lumbosacral    Demand ischemia ([place]) 09/23/2019   Depression    Diabetic neuropathy (HCC)    Diastolic dysfunction    Essential hypertension    GERD  (gastroesophageal reflux disease)    Hyperlipidemia    Iron deficiency anemia    Left knee DJD    Lumbar spinal stenosis    Lymphedema 11/2015   BOTH LEGS   NASH (nonalcoholic steatohepatitis)    Peripheral edema    Peripheral edema 10/05/2015   Type 2 diabetes mellitus ([place])     Past Surgical History:  Procedure Laterality Date   BIOPSY N/A 07/21/2014   Procedure: BIOPSY;  Surgeon: Daneil Dolin, MD;  Location: [facility];  Service: Endoscopy;  Laterality: N/A;   CATARACT EXTRACTION W/PHACO Right 07/08/2016   Procedure: CATARACT EXTRACTION PHACO AND INTRAOCULAR LENS PLACEMENT RIGHT EYE;  Surgeon: Tonny Branch, MD;  Location: [facility];  Service: Ophthalmology;  Laterality: Right;  CDE: 9.01   CATARACT EXTRACTION W/PHACO Left 07/22/2016   Procedure: CATARACT EXTRACTION PHACO AND INTRAOCULAR LENS PLACEMENT LEFT EYE CDE=10.69;  Surgeon: Tonny Branch, MD;  Location: [facility];  Service: Ophthalmology;  Laterality: Left;  left -    COLONOSCOPY  2011   [facility]: normal colon, normal distal ileum   COLONOSCOPY WITH PROPOFOL N/A 07/21/2014   Procedure: ATTEMPTED COLONOSCOPY WITH PROPOFOL-HAD TO STOP DUE TO BRADYCARDIA;  Surgeon: Daneil Dolin, MD;  Location: [facility];  Service: Endoscopy;  Laterality: N/A;   EGD with enteroscopy  2011   [facility]: normal esophagus and stomach. Normal duodenum, jejunum. No evidence of AVMs.  ESOPHAGOGASTRODUODENOSCOPY (EGD) WITH PROPOFOL N/A 07/21/2014   Procedure: ESOPHAGOGASTRODUODENOSCOPY (EGD) WITH PROPOFOL;  Surgeon: Daneil Dolin, MD;  Location: [facility];  Service: Endoscopy;  Laterality: N/A;   HERNIA REPAIR     KNEE ARTHROSCOPY WITH MEDIAL MENISECTOMY Left 11/06/2012   Procedure: KNEE ARTHROSCOPY WITH MEDIAL MENISECTOMY;  Surgeon: Carole Civil, MD;  Location: [facility];  Service: Orthopedics;  Laterality: Left;   Lipoma removal     Stomach   LUMBAR LAMINECTOMY/DECOMPRESSION MICRODISCECTOMY Left 08/30/2013   Procedure: LUMBAR LAMINECTOMY/DECOMPRESSION  MICRODISCECTOMY LEFT  LUMBAR TWO THREE;  Surgeon: Otilio Connors, MD;  Location: [place] [facility];  Service: Neurosurgery;  Laterality: Left;   RIGHT/LEFT HEART CATH AND CORONARY ANGIOGRAPHY N/A 09/22/2019   Procedure: RIGHT/LEFT HEART CATH AND CORONARY ANGIOGRAPHY;  Surgeon: Martinique, Peter M, MD;  Location: [place] [facility];  Service: Cardiovascular;  Laterality: N/A;   SHOULDER SURGERY     Rght-rotator cuff   TOOTH EXTRACTION      Family History  Problem Relation Age of Onset   Heart disease Mother    Hyperlipidemia Mother    Hypertension Mother    Depression Mother    Diabetes Mother    Rectal cancer Mother    Cancer Mother    Heart disease Father    Hypertension Father    Hyperlipidemia Father    Diabetes Father    Cancer Father    Heart disease Sister    Hyperlipidemia Sister    Hypertension Sister    Diabetes Sister    Diabetes Brother    Heart disease Sister    Hyperlipidemia Sister    Hypertension Sister    Heart disease Sister    Hyperlipidemia Sister    Hypertension Sister    Diabetes Sister    Diabetes Brother     Social History   Socioeconomic History   Marital status: Widowed    Spouse name: Not on file   Number of children: Not on file   Years of education: 12   Highest education level: Not on file  Occupational History   Not on file  Tobacco Use   Smoking status: Former    Packs/day: 2.50    Years: 44.00    Pack years: 110.00    Types: Cigarettes    Start date: 05/07/1955    Quit date: 10/01/1999    Years since quitting: 21.7   Smokeless tobacco: Never  Vaping Use   Vaping Use: Never used  Substance and Sexual Activity   Alcohol use: No    Alcohol/week: 0.0 standard drinks   Drug use: No   Sexual activity: Yes    Birth control/protection: None  Other Topics Concern   Not on file  Social History Narrative   Not on file   Social Determinants of Health   Financial Resource Strain: Low Risk    Difficulty of Paying Living Expenses: Not hard  at all  Food Insecurity: No Food Insecurity   Worried About Charity fundraiser in the Last Year: Never true   [place] in the Last Year: Never true  Transportation Needs: No Transportation Needs   Lack of Transportation (Medical): No   Lack of Transportation (Non-Medical): No  Physical Activity: Insufficiently Active   Days of Exercise per Week: 4 days   Minutes of Exercise per Session: 30 min  Stress: No Stress Concern Present   Feeling of Stress : Not at all  Social Connections: Moderately Isolated   Frequency of Communication with  Friends and Family: More than three times a week   Frequency of Social Gatherings with Friends and Family: More than three times a week   Attends Religious Services: 1 to 4 times per year   Active Member of [employer] or Organizations: No   Attends Archivist Meetings: Never   Marital Status: Widowed  Human resources officer Violence: Not At Risk   Fear of Current or Ex-Partner: No   Emotionally Abused: No   Physically Abused: No   Sexually Abused: No    Outpatient Medications Prior to Visit  Medication Sig Dispense Refill   allopurinol (ZYLOPRIM) 100 MG tablet Take 100 mg by mouth daily.     aspirin EC 81 MG tablet Take 81 mg by mouth daily.     baclofen (LIORESAL) 10 MG tablet Take 5 mg by mouth 3 (three) times daily as needed.     bumetanide (BUMEX) 2 MG tablet Take 1.5 tablets (3 mg total) by mouth 2 (two) times daily. 270 tablet 3   carvedilol (COREG) 12.5 MG tablet Take 1 tablet (12.5 mg total) by mouth 2 (two) times daily. 180 tablet 3   diclofenac sodium (VOLTAREN) 1 % GEL Apply 4 g topically 4 (four) times daily as needed (pain). 100 g 3   Ferrous Sulfate (IRON) 325 (65 FE) MG TABS Take 1 tablet by mouth daily.      gabapentin (NEURONTIN) 400 MG capsule Take 1 capsule (400 mg total) by mouth 3 (three) times daily. 90 capsule 0   HYDROcodone-acetaminophen (NORCO) 10-325 MG tablet Take 1 tablet by mouth 3 (three) times daily as needed.      Insulin Glargine (LANTUS SOLOSTAR) 100 UNIT/ML Solostar Pen Inject 5 Units into the skin daily at 10 pm. (Patient taking differently: Inject 25 Units into the skin at bedtime.) 15 mL 0   levothyroxine (SYNTHROID) 112 MCG tablet TAKE 1 TABLET BY MOUTH ONCE DAILY. 30 tablet 0   magnesium oxide (MAG-OX) 400 MG tablet Take 1 tablet (400 mg total) by mouth 2 (two) times daily. 60 tablet 11   metFORMIN (GLUCOPHAGE) 1000 MG tablet TAKE ONE TABLET BY MOUTH TWICE DAILY WITH A MEAL. 180 tablet 3   metolazone (ZAROXOLYN) 2.5 MG tablet Two Times Weekly on Monday and Thursday 10 tablet 11   nystatin cream (MYCOSTATIN) APPLY TO AFFECTED AREA TWICE DAILY. 30 g 0   oxyCODONE-Acetaminophen 10-300 MG/5ML SOLN TAKE 7.5MG BY MOUTH FOUR TIMES A DAY     pantoprazole (PROTONIX) 40 MG tablet Take 1 tablet (40 mg total) by mouth daily. 30 tablet 11   potassium chloride (K-DUR) 10 MEQ tablet Take 1 tablet (10 mEq total) by mouth 2 (two) times daily. 180 tablet 3   pravastatin (PRAVACHOL) 40 MG tablet TAKE ONE TABLET BY MOUTH DAILY. 90 tablet 0   sacubitril-valsartan (ENTRESTO) 49-51 MG Take 1 tablet by mouth 2 (two) times daily. 60 tablet 11   tiZANidine (ZANAFLEX) 2 MG tablet Take 1 tablet (2 mg total) by mouth every 8 (eight) hours as needed for muscle spasms. 30 tablet 0   traZODone (DESYREL) 100 MG tablet TAKE 1 TABLET BY MOUTH AT BEDTIME AS NEEDED FOR SLEEP. 90 tablet 2   triamcinolone cream (KENALOG) 0.1 % Apply 1 application topically 2 (two) times daily. To arms/hand 30 g 2   XTAMPZA ER 9 MG C12A Take 1 capsule by mouth 2 (two) times daily.     nitrofurantoin, macrocrystal-monohydrate, (MACROBID) 100 MG capsule Take 1 capsule (100 mg total) by mouth 2 (two)  times daily. 10 capsule 0   Vitamin D, Ergocalciferol, (DRISDOL) 1.25 MG (50000 UNIT) CAPS capsule Take 1 capsule (50,000 Units total) by mouth every 7 (seven) days. 12 capsule 1   No facility-administered medications prior to visit.    No Known  Allergies  ROS Review of Systems  Constitutional:  Negative for chills and fever.  HENT:  Negative for congestion and sore throat.   Eyes:  Negative for pain and discharge.  Respiratory:  Negative for cough and shortness of breath.   Cardiovascular:  Positive for leg swelling. Negative for chest pain and palpitations.  Gastrointestinal:  Negative for constipation, diarrhea, nausea and vomiting.  Endocrine: Negative for polydipsia and polyuria.  Genitourinary:  Positive for dysuria. Negative for hematuria.  Musculoskeletal:  Positive for arthralgias, back pain and neck pain. Negative for neck stiffness.  Skin:  Positive for color change.  Neurological:  Positive for numbness. Negative for dizziness and headaches.  Psychiatric/Behavioral:  Negative for agitation and behavioral problems.      Objective:    Physical Exam Vitals reviewed.  Constitutional:      General: He is not in acute distress.    Appearance: He is obese. He is not diaphoretic.     Comments: In wheelchair  HENT:     Head: Normocephalic and atraumatic.     Nose: Nose normal.     Mouth/Throat:     Mouth: Mucous membranes are moist.  Eyes:     General: No scleral icterus.    Extraocular Movements: Extraocular movements intact.     Pupils: Pupils are equal, round, and reactive to light.  Cardiovascular:     Rate and Rhythm: Normal rate and regular rhythm.     Pulses: Normal pulses.     Heart sounds: Normal heart sounds. No murmur heard. Pulmonary:     Breath sounds: Normal breath sounds. No wheezing or rales.  Abdominal:     Palpations: Abdomen is soft.     Tenderness: There is no abdominal tenderness.  Musculoskeletal:     Cervical back: Neck supple. No tenderness.     Right lower leg: Edema (3+) present.     Left lower leg: Edema (3+) present.  Skin:    General: Skin is warm.     Findings: Erythema (Over b/l LE) present.  Neurological:     General: No focal deficit present.     Mental Status: He is  alert and oriented to person, place, and time.  Psychiatric:        Mood and Affect: Mood normal.        Behavior: Behavior normal.    BP (!) 195/96 (BP Location: Left Arm, Patient Position: Sitting, Cuff Size: Normal)   Pulse 96   Resp 18   Ht 4' 11"  (1.499 m)   Wt 217 lb (98.4 kg)   SpO2 99%   BMI 43.83 kg/m  Wt Readings from Last 3 Encounters:  07/10/21 217 lb (98.4 kg)  05/23/21 210 lb (95.3 kg)  04/13/21 215 lb (97.5 kg)     Health Maintenance Due  Topic Date Due   OPHTHALMOLOGY EXAM  02/02/2021    There are no preventive care reminders to display for this patient.  Lab Results  Component Value Date   TSH 3.250 04/13/2021   Lab Results  Component Value Date   WBC 4.2 04/13/2021   HGB 11.5 (L) 04/13/2021   HCT 35.7 (L) 04/13/2021   MCV 82 04/13/2021   PLT 179 04/13/2021   Lab  Results  Component Value Date   NA 136 04/13/2021   K 4.9 04/13/2021   CO2 26 04/13/2021   GLUCOSE 125 (H) 04/13/2021   BUN 11 04/13/2021   CREATININE 0.77 04/13/2021   BILITOT 0.3 04/13/2021   ALKPHOS 71 04/13/2021   AST 17 04/13/2021   ALT 25 04/13/2021   PROT 6.1 04/13/2021   ALBUMIN 4.0 04/13/2021   CALCIUM 9.7 04/13/2021   ANIONGAP 10 01/25/2020   EGFR 95 04/13/2021   Lab Results  Component Value Date   CHOL 143 04/13/2021   Lab Results  Component Value Date   HDL 49 04/13/2021   Lab Results  Component Value Date   LDLCALC 70 04/13/2021   Lab Results  Component Value Date   TRIG 137 04/13/2021   Lab Results  Component Value Date   CHOLHDL 3.5 04/10/2020   Lab Results  Component Value Date   HGBA1C 7.3 (H) 04/13/2021      Assessment & Plan:   Problem List Items Addressed This Visit       Cardiovascular and Mediastinum   Hypertension    BP Readings from Last 1 Encounters:  07/10/21 (!) 195/96  Elevated today, has not had his medications today On Entresto and Coreg for HFrEF, also on diuretics- followed by Cardiology Counseled for compliance  with the medications Advised DASH diet      Chronic combined systolic and diastolic heart failure (HCC)    Has chronic lymphedema No dyspnea currently On Entresto, Coreg Bumenatide and Metolazone Follows up with Cardiology        Endocrine   Diabetes mellitus ([place])    Lab Results  Component Value Date   HGBA1C 7.3 (H) 04/13/2021  On Lantus 25 U qHS and Metformin, followed by [place] [facility] On ARNI and statin Has diabetic neuropathy - on Gabapentin        Genitourinary   Acute cystitis without hematuria - Primary    Has had UTIs in the past UA reviewed, check urine culture Started Bactrim Had referred to Urology for elevated PSA and recurrent UTI, does not want to see Urology.      Relevant Medications   sulfamethoxazole-trimethoprim (BACTRIM DS) 800-160 MG tablet   Other Relevant Orders   POCT URINALYSIS DIP (CLINITEK) (Completed)   Urine Culture     Other   Vitamin D deficiency    Last vitamin D Lab Results  Component Value Date   VD25OH 17.2 (L) 05/23/2021  Started Vitamin D 50,000 IU qw      Relevant Medications   Vitamin D, Ergocalciferol, (DRISDOL) 1.25 MG (50000 UNIT) CAPS capsule   Benign prostatic hyperplasia with urinary frequency    Has chronic nocturia and urinary frequency Started Tamsulosin Does not want to see Urology despite elevated PSA      Relevant Medications   tamsulosin (FLOMAX) 0.4 MG CAPS capsule   Other Visit Diagnoses     Need for immunization against influenza       Relevant Orders   Flu Vaccine QUAD High Dose(Fluad) (Completed)       Meds ordered this encounter  Medications   sulfamethoxazole-trimethoprim (BACTRIM DS) 800-160 MG tablet    Sig: Take 1 tablet by mouth 2 (two) times daily.    Dispense:  14 tablet    Refill:  0   tamsulosin (FLOMAX) 0.4 MG CAPS capsule    Sig: Take 1 capsule (0.4 mg total) by mouth daily.    Dispense:  30 capsule    Refill:  3  Vitamin D, Ergocalciferol, (DRISDOL) 1.25 MG (50000 UNIT)  CAPS capsule    Sig: Take 1 capsule (50,000 Units total) by mouth every 7 (seven) days.    Dispense:  12 capsule    Refill:  1    Follow-up: Return in about 5 months (around 12/08/2021) for DM and BPH.    Lindell Spar, MD

## 2021-07-13 LAB — URINE CULTURE

## 2021-07-16 ENCOUNTER — Ambulatory Visit (INDEPENDENT_AMBULATORY_CARE_PROVIDER_SITE_OTHER): Payer: No Typology Code available for payment source | Admitting: Orthopedic Surgery

## 2021-07-16 ENCOUNTER — Other Ambulatory Visit: Payer: Self-pay

## 2021-07-16 ENCOUNTER — Encounter: Payer: Self-pay | Admitting: Orthopedic Surgery

## 2021-07-16 VITALS — BP 195/91 | HR 84 | Ht 59.0 in | Wt 217.0 lb

## 2021-07-16 DIAGNOSIS — G8929 Other chronic pain: Secondary | ICD-10-CM | POA: Diagnosis not present

## 2021-07-16 DIAGNOSIS — M25562 Pain in left knee: Secondary | ICD-10-CM

## 2021-07-16 DIAGNOSIS — M25561 Pain in right knee: Secondary | ICD-10-CM | POA: Diagnosis not present

## 2021-07-16 NOTE — Progress Notes (Signed)
Chief Complaint  Patient presents with   Injections    Bilateral knee   Procedure note  Injection  Verbal consent was obtained to inject the right knee  Timeout procedure was completed to confirm injection site  Diagnosis chronic pain osteoarthritis right knee  Medications used depomedrol Lidocaine 1% plain 3 cc  Anesthesia was provided by ethyl chloride spray  Prep was performed with alcohol  Technique of injection the knee was placed at 90 degrees of flexion injection was given lateral patellar portal  No complications were noted Procedure note  Injection  Verbal consent was obtained to inject the left knee  Timeout procedure was completed to confirm injection site  Diagnosis chronic pain left knee with osteoarthritis left knee  Medications used Depomedrol  Lidocaine 1% plain 3 cc  Anesthesia was provided by ethyl chloride spray  Prep was performed with alcohol  Technique of injection knee was placed in 90 degrees of flexion injection was given through the lateral portal  No complications were noted  Encounter Diagnoses  Name Primary?   Chronic pain of right knee Yes   Chronic pain of left knee     25-monthfollow-up for repeat injection  3-day follow-up to evaluate new left shoulder pain

## 2021-07-19 ENCOUNTER — Other Ambulatory Visit: Payer: Self-pay

## 2021-07-19 ENCOUNTER — Ambulatory Visit (INDEPENDENT_AMBULATORY_CARE_PROVIDER_SITE_OTHER): Payer: No Typology Code available for payment source | Admitting: Orthopedic Surgery

## 2021-07-19 ENCOUNTER — Encounter: Payer: Self-pay | Admitting: Orthopedic Surgery

## 2021-07-19 ENCOUNTER — Ambulatory Visit: Payer: Medicare Other

## 2021-07-19 VITALS — BP 171/78 | HR 71 | Ht 59.0 in | Wt 217.0 lb

## 2021-07-19 DIAGNOSIS — M47812 Spondylosis without myelopathy or radiculopathy, cervical region: Secondary | ICD-10-CM | POA: Diagnosis not present

## 2021-07-19 DIAGNOSIS — M542 Cervicalgia: Secondary | ICD-10-CM

## 2021-07-19 DIAGNOSIS — M792 Neuralgia and neuritis, unspecified: Secondary | ICD-10-CM

## 2021-07-19 MED ORDER — MELOXICAM 7.5 MG PO TABS: 7.5000 mg | ORAL_TABLET | Freq: Every day | ORAL | 5 refills | Status: DC

## 2021-07-19 NOTE — Progress Notes (Signed)
EVALUATION AND MANAGEMENT   Type of appointment : new problem   PLAN:  Meds ordered this encounter  Medications   meloxicam (MOBIC) 7.5 MG tablet    Sig: Take 1 tablet (7.5 mg total) by mouth daily.    Dispense:  30 tablet    Refill:  5     Meds ordered this encounter  Medications   meloxicam (MOBIC) 7.5 MG tablet    Sig: Take 1 tablet (7.5 mg total) by mouth daily.    Dispense:  30 tablet    Refill:  5      Chief Complaint  Patient presents with   Neck Pain     75 yo male h/o bursitis rt shoulder  C/o neck pain and headaches. Pain with rom and decreased rom. He tried some patches on the soft tissues but they no longer help. He s also on baclofen 3 x a day as needed      Review of Systems  Constitutional:  Negative for fever.  Musculoskeletal:  Positive for joint pain.  Skin: Negative.   Neurological:  Negative for tingling and sensory change.    Body mass index is 43.83 kg/m.  Physical Exam Constitutional:      Appearance: Normal appearance. He is obese.  HENT:     Head: Normocephalic and atraumatic.  Musculoskeletal:     Cervical back: Tenderness and bony tenderness present. No swelling, edema, deformity, erythema, signs of trauma, lacerations, rigidity, spasms, torticollis or crepitus. Pain with movement present. Decreased range of motion.     Comments: Upper extremities normal strength 5/5   Skin:    General: Skin is warm and dry.     Capillary Refill: Capillary refill takes less than 2 seconds.  Neurological:     General: No focal deficit present.     Mental Status: He is alert and oriented to person, place, and time.     Sensory: No sensory deficit.     Motor: No weakness.     Gait: Gait abnormal.     Deep Tendon Reflexes: Reflexes normal.  Psychiatric:        Mood and Affect: Mood normal.        Behavior: Behavior normal.        Thought Content: Thought content normal.    Past Medical History:  Diagnosis Date   Arthritis    Congestive  heart disease ([place]) 11/2015   COPD (chronic obstructive pulmonary disease) (HCC)    DDD (degenerative disc disease), lumbosacral    Demand ischemia ([place]) 09/23/2019   Depression    Diabetic neuropathy (HCC)    Diastolic dysfunction    Essential hypertension    GERD (gastroesophageal reflux disease)    Hyperlipidemia    Iron deficiency anemia    Left knee DJD    Lumbar spinal stenosis    Lymphedema 11/2015   BOTH LEGS   NASH (nonalcoholic steatohepatitis)    Peripheral edema    Peripheral edema 10/05/2015   Type 2 diabetes mellitus ([place])    Past Surgical History:  Procedure Laterality Date   BIOPSY N/A 07/21/2014   Procedure: BIOPSY;  Surgeon: Daneil Dolin, MD;  Location: [facility];  Service: Endoscopy;  Laterality: N/A;   CATARACT EXTRACTION W/PHACO Right 07/08/2016   Procedure: CATARACT EXTRACTION PHACO AND INTRAOCULAR LENS PLACEMENT RIGHT EYE;  Surgeon: Tonny Branch, MD;  Location: [facility];  Service: Ophthalmology;  Laterality: Right;  CDE: 9.01   CATARACT EXTRACTION W/PHACO Left 07/22/2016   Procedure: CATARACT EXTRACTION PHACO  AND INTRAOCULAR LENS PLACEMENT LEFT EYE CDE=10.69;  Surgeon: Tonny Branch, MD;  Location: [facility];  Service: Ophthalmology;  Laterality: Left;  left -    COLONOSCOPY  2011   [facility]: normal colon, normal distal ileum   COLONOSCOPY WITH PROPOFOL N/A 07/21/2014   Procedure: ATTEMPTED COLONOSCOPY WITH PROPOFOL-HAD TO STOP DUE TO BRADYCARDIA;  Surgeon: Daneil Dolin, MD;  Location: [facility];  Service: Endoscopy;  Laterality: N/A;   EGD with enteroscopy  2011   [facility]: normal esophagus and stomach. Normal duodenum, jejunum. No evidence of AVMs.    ESOPHAGOGASTRODUODENOSCOPY (EGD) WITH PROPOFOL N/A 07/21/2014   Procedure: ESOPHAGOGASTRODUODENOSCOPY (EGD) WITH PROPOFOL;  Surgeon: Daneil Dolin, MD;  Location: [facility];  Service: Endoscopy;  Laterality: N/A;   HERNIA REPAIR     KNEE ARTHROSCOPY WITH MEDIAL MENISECTOMY Left 11/06/2012   Procedure: KNEE ARTHROSCOPY WITH  MEDIAL MENISECTOMY;  Surgeon: Carole Civil, MD;  Location: [facility];  Service: Orthopedics;  Laterality: Left;   Lipoma removal     Stomach   LUMBAR LAMINECTOMY/DECOMPRESSION MICRODISCECTOMY Left 08/30/2013   Procedure: LUMBAR LAMINECTOMY/DECOMPRESSION MICRODISCECTOMY LEFT  LUMBAR TWO THREE;  Surgeon: Otilio Connors, MD;  Location: [place] [facility];  Service: Neurosurgery;  Laterality: Left;   RIGHT/LEFT HEART CATH AND CORONARY ANGIOGRAPHY N/A 09/22/2019   Procedure: RIGHT/LEFT HEART CATH AND CORONARY ANGIOGRAPHY;  Surgeon: Martinique, Peter M, MD;  Location: [place] [facility];  Service: Cardiovascular;  Laterality: N/A;   SHOULDER SURGERY     Rght-rotator cuff   TOOTH EXTRACTION     Social History   Tobacco Use   Smoking status: Former    Packs/day: 2.50    Years: 44.00    Pack years: 110.00    Types: Cigarettes    Start date: 05/07/1955    Quit date: 10/01/1999    Years since quitting: 21.8   Smokeless tobacco: Never  Vaping Use   Vaping Use: Never used  Substance Use Topics   Alcohol use: No    Alcohol/week: 0.0 standard drinks   Drug use: No     Assessment and Plan:  Encounter Diagnoses  Name Primary?   Neck pain    Spondylosis without myelopathy or radiculopathy, cervical region Yes    C SPINE FILM SEVERE CERVICAL SPONDYLOSIS   NON SURGICAL REC MELOXICAM WITH PROTONIX GI PRECAUTIONS GIVEN

## 2021-07-31 ENCOUNTER — Ambulatory Visit (HOSPITAL_COMMUNITY): Payer: Medicare Other | Admitting: Physical Therapy

## 2021-08-02 ENCOUNTER — Ambulatory Visit (HOSPITAL_COMMUNITY): Payer: Medicare Other | Admitting: Physical Therapy

## 2021-08-03 ENCOUNTER — Other Ambulatory Visit: Payer: Self-pay

## 2021-08-03 ENCOUNTER — Other Ambulatory Visit (HOSPITAL_COMMUNITY)
Admission: RE | Admit: 2021-08-03 | Discharge: 2021-08-03 | Disposition: A | Discharge status: Home / Self Care | Payer: Medicare Other | Source: Ambulatory Visit | Attending: Cardiology | Admitting: Cardiology

## 2021-08-03 ENCOUNTER — Ambulatory Visit (INDEPENDENT_AMBULATORY_CARE_PROVIDER_SITE_OTHER): Payer: No Typology Code available for payment source | Admitting: Cardiology

## 2021-08-03 ENCOUNTER — Encounter: Payer: Self-pay | Admitting: Cardiology

## 2021-08-03 VITALS — BP 136/78 | HR 72 | Ht 59.0 in | Wt 212.0 lb

## 2021-08-03 DIAGNOSIS — I110 Hypertensive heart disease with heart failure: Secondary | ICD-10-CM | POA: Diagnosis not present

## 2021-08-03 DIAGNOSIS — I5022 Chronic systolic (congestive) heart failure: Secondary | ICD-10-CM | POA: Insufficient documentation

## 2021-08-03 DIAGNOSIS — Z79899 Other long term (current) drug therapy: Secondary | ICD-10-CM | POA: Diagnosis not present

## 2021-08-03 DIAGNOSIS — I10 Essential (primary) hypertension: Secondary | ICD-10-CM

## 2021-08-03 LAB — BASIC METABOLIC PANEL
Anion gap: 7 (ref 5–15)
BUN: 12 mg/dL (ref 8–23)
CO2: 28 mmol/L (ref 22–32)
Calcium: 9 mg/dL (ref 8.9–10.3)
Chloride: 99 mmol/L (ref 98–111)
Creatinine, Ser: 0.7 mg/dL (ref 0.61–1.24)
GFR, Estimated: >60 mL/min (ref >60)
Glucose, Bld: 99 mg/dL (ref 70–99)
Potassium: 4.5 mmol/L (ref 3.5–5.1)
Sodium: 134 mmol/L — ABNORMAL LOW (ref 135–145)

## 2021-08-03 LAB — BRAIN NATRIURETIC PEPTIDE: B Natriuretic Peptide: 127 pg/mL — ABNORMAL HIGH (ref 0.0–100.0)

## 2021-08-03 LAB — MAGNESIUM: Magnesium: 1.6 mg/dL — ABNORMAL LOW (ref 1.7–2.4)

## 2021-08-03 NOTE — Progress Notes (Signed)
Clinical Summary Mr. Riechers is a 75 y.o.male seen today for follow up of the following medical problems.      1. Chronic combined systolic/ diastolic heart failure - several year history of diastolic HF. New diagnosis of systolic HF during 15/4008 admission 08/2019 echo: <20%, akinesis distal 1/3 08/2019 cath mild CAD, normal filling pressures      -last seen in clnic 01/2020 - no recent SOB/DOE - LE edema improving. He is on bumex 34m bid, metolazone 2.529mMon and Thurs    2. Lymphedema - upcoming appointments with lymphedema clinic         SH: veteran, he is a [employer]. Served in the [employer] Sister is JaShirlean Mylarourts also a patient of mine, recently passed away around labor day 2003/25/20rom pancreatic cancer   Past Medical History:  Diagnosis Date   Arthritis    Cong[place]ive heart disease [place]   COPD (chronic obstructive pulmonary disease) ([facility]   DDD (degenerative disc disease), lumbosacral    Demand ischemia ([facility]   Depression    Diabetic neuropathy ([facility] [place]   Diastolic dysfunction    Essential hypertension    GERD (gastroesophageal reflux disease)    Hyperlipidemia    Iron deficiency anemia    Left knee DJD    Lumbar spinal stenosis    Lymphedema 022017/03/26 BOTH LEGS   NASH (nonalcoholic steatohepatitis)    Peripheral edema    Peripheral edema 10/05/2015   Type 2 diabetes mellitus (HCC)      No Known Allergies   Current Outpatient Medications  Medication Sig Dispense Refill   allopurinol (ZYLOPRIM) 100 MG tablet Take 100 mg by mouth daily.     aspirin EC 81 MG tablet Take 81 mg by mouth daily.     baclofen (LIORESAL) 10 MG tablet Take 5 mg by mouth 3 (three) times daily as needed.     bumetanide (BUMEX) 2 MG tablet Take 1.5 tablets (3 mg total) by mouth 2 (two) times daily. 270 tablet 3   carvedilol (COREG) 12.5 MG tablet Take 1 tablet (12.5 mg total) by mouth 2 (two) times daily. 180 tablet 3   diclofenac sodium (VOLTAREN) 1 %  GEL Apply 4 g topically 4 (four) times daily as needed (pain). 100 g 3   Ferrous Sulfate (IRON) 325 (65 FE) MG TABS Take 1 tablet by mouth daily.      gabapentin (NEURONTIN) 400 MG capsule Take 1 capsule (400 mg total) by mouth 3 (three) times daily. 90 capsule 0   HYDROcodone-acetaminophen (NORCO) 10-325 MG tablet Take 1 tablet by mouth 3 (three) times daily as needed.     Insulin Glargine (LANTUS SOLOSTAR) 100 UNIT/ML Solostar Pen Inject 5 Units into the skin daily at 10 pm. (Patient taking differently: Inject 25 Units into the skin at bedtime.) 15 mL 0   levothyroxine (SYNTHROID) 112 MCG tablet TAKE 1 TABLET BY MOUTH ONCE DAILY. 30 tablet 0   magnesium oxide (MAG-OX) 400 MG tablet Take 1 tablet (400 mg total) by mouth 2 (two) times daily. 60 tablet 11   meloxicam (MOBIC) 7.5 MG tablet Take 1 tablet (7.5 mg total) by mouth daily. 30 tablet 5   metFORMIN (GLUCOPHAGE) 1000 MG tablet TAKE ONE TABLET BY MOUTH TWICE DAILY WITH A MEAL. 180 tablet 3   metolazone (ZAROXOLYN) 2.5 MG tablet Two Times Weekly on Monday and Thursday 10 tablet 11   nystatin cream (MYCOSTATIN) APPLY TO AFFECTED AREA TWICE DAILY.  30 g 0   oxyCODONE-Acetaminophen 10-300 MG/5ML SOLN TAKE 7.5MG BY MOUTH FOUR TIMES A DAY     pantoprazole (PROTONIX) 40 MG tablet Take 1 tablet (40 mg total) by mouth daily. 30 tablet 11   potassium chloride (K-DUR) 10 MEQ tablet Take 1 tablet (10 mEq total) by mouth 2 (two) times daily. 180 tablet 3   pravastatin (PRAVACHOL) 40 MG tablet TAKE ONE TABLET BY MOUTH DAILY. 90 tablet 0   sacubitril-valsartan (ENTRESTO) 49-51 MG Take 1 tablet by mouth 2 (two) times daily. 60 tablet 11   sulfamethoxazole-trimethoprim (BACTRIM DS) 800-160 MG tablet Take 1 tablet by mouth 2 (two) times daily. 14 tablet 0   tamsulosin (FLOMAX) 0.4 MG CAPS capsule Take 1 capsule (0.4 mg total) by mouth daily. 30 capsule 3   tiZANidine (ZANAFLEX) 2 MG tablet Take 1 tablet (2 mg total) by mouth every 8 (eight) hours as needed for  muscle spasms. 30 tablet 0   traZODone (DESYREL) 100 MG tablet TAKE 1 TABLET BY MOUTH AT BEDTIME AS NEEDED FOR SLEEP. 90 tablet 2   triamcinolone cream (KENALOG) 0.1 % Apply 1 application topically 2 (two) times daily. To arms/hand 30 g 2   Vitamin D, Ergocalciferol, (DRISDOL) 1.25 MG (50000 UNIT) CAPS capsule Take 1 capsule (50,000 Units total) by mouth every 7 (seven) days. 12 capsule 1   XTAMPZA ER 9 MG C12A Take 1 capsule by mouth 2 (two) times daily.     No current facility-administered medications for this visit.     Past Surgical History:  Procedure Laterality Date   BIOPSY N/A 07/21/2014   Procedure: BIOPSY;  Surgeon: Daneil Dolin, MD;  Location: [facility];  Service: Endoscopy;  Laterality: N/A;   CATARACT EXTRACTION W/PHACO Right 07/08/2016   Procedure: CATARACT EXTRACTION PHACO AND INTRAOCULAR LENS PLACEMENT RIGHT EYE;  Surgeon: Tonny , MD;  Location: [facility];  Service: Ophthalmology;  Laterality: Right;  CDE: 9.01   CATARACT EXTRACTION W/PHACO Left 07/22/2016   Procedure: CATARACT EXTRACTION PHACO AND INTRAOCULAR LENS PLACEMENT LEFT EYE CDE=10.69;  Surgeon: Tonny , MD;  Location: [facility];  Service: Ophthalmology;  Laterality: Left;  left -    COLONOSCOPY  2011   [facility]: normal colon, normal distal ileum   COLONOSCOPY WITH PROPOFOL N/A 07/21/2014   Procedure: ATTEMPTED COLONOSCOPY WITH PROPOFOL-HAD TO STOP DUE TO BRADYCARDIA;  Surgeon: Daneil Dolin, MD;  Location: [facility];  Service: Endoscopy;  Laterality: N/A;   EGD with enteroscopy  2011   [facility]: normal esophagus and stomach. Normal duodenum, jejunum. No evidence of AVMs.    ESOPHAGOGASTRODUODENOSCOPY (EGD) WITH PROPOFOL N/A 07/21/2014   Procedure: ESOPHAGOGASTRODUODENOSCOPY (EGD) WITH PROPOFOL;  Surgeon: Daneil Dolin, MD;  Location: [facility];  Service: Endoscopy;  Laterality: N/A;   HERNIA REPAIR     KNEE ARTHROSCOPY WITH MEDIAL MENISECTOMY Left 11/06/2012   Procedure: KNEE ARTHROSCOPY WITH MEDIAL MENISECTOMY;   Surgeon: Carole Civil, MD;  Location: [facility];  Service: Orthopedics;  Laterality: Left;   Lipoma removal     Stomach   LUMBAR LAMINECTOMY/DECOMPRESSION MICRODISCECTOMY Left 08/30/2013   Procedure: LUMBAR LAMINECTOMY/DECOMPRESSION MICRODISCECTOMY LEFT  LUMBAR TWO THREE;  Surgeon: Otilio Connors, MD;  Location: [place] [facility];  Service: Neurosurgery;  Laterality: Left;   RIGHT/LEFT HEART CATH AND CORONARY ANGIOGRAPHY N/A 09/22/2019   Procedure: RIGHT/LEFT HEART CATH AND CORONARY ANGIOGRAPHY;  Surgeon: Martinique, Peter M, MD;  Location: [place] [facility];  Service: Cardiovascular;  Laterality: N/A;   SHOULDER SURGERY  Rght-rotator cuff   TOOTH EXTRACTION       No Known Allergies    Family History  Problem Relation Age of Onset   Heart disease Mother    Hyperlipidemia Mother    Hypertension Mother    Depression Mother    Diabetes Mother    Rectal cancer Mother    Cancer Mother    Heart disease Father    Hypertension Father    Hyperlipidemia Father    Diabetes Father    Cancer Father    Heart disease Sister    Hyperlipidemia Sister    Hypertension Sister    Diabetes Sister    Diabetes Brother    Heart disease Sister    Hyperlipidemia Sister    Hypertension Sister    Heart disease Sister    Hyperlipidemia Sister    Hypertension Sister    Diabetes Sister    Diabetes Brother      Social History Mr. Zwiefelhofer reports that he quit smoking about 21 years ago. His smoking use included cigarettes. He started smoking about 66 years ago. He has a 110.00 pack-year smoking history. He has never used smokeless tobacco. Mr. Lemming reports no history of alcohol use.   Review of Systems CONSTITUTIONAL: No weight loss, fever, chills, weakness or fatigue.  HEENT: Eyes: No visual loss, blurred vision, double vision or yellow sclerae.No hearing loss, sneezing, congestion, runny nose or sore throat.  SKIN: No rash or itching.  CARDIOVASCULAR: per hpi RESPIRATORY: No shortness of  breath, cough or sputum.  GASTROINTESTINAL: No anorexia, nausea, vomiting or diarrhea. No abdominal pain or blood.  GENITOURINARY: No burning on urination, no polyuria NEUROLOGICAL: No headache, dizziness, syncope, paralysis, ataxia, numbness or tingling in the extremities. No change in bowel or bladder control.  MUSCULOSKELETAL: No muscle, back pain, joint pain or stiffness.  LYMPHATICS: No enlarged nodes. No history of splenectomy.  PSYCHIATRIC: No history of depression or anxiety.  ENDOCRINOLOGIC: No reports of sweating, cold or heat intolerance. No polyuria or polydipsia.  Marland Kitchen   Physical Examination Today's Vitals   08/03/21 0842  BP: 136/78  Pulse: 72  SpO2: 98%  Weight: 212 lb (96.2 kg)  Height: 4' 11"  (1.499 m)   Body mass index is 42.82 kg/m.  Gen: resting comfortably, no acute distress HEENT: no scleral icterus, pupils equal round and reactive, no palptable cervical adenopathy,  CV: RRR, no m/r/g no jvd Resp: Clear to auscultation bilaterally GI: abdomen is soft, non-tender, non-distended, normal bowel sounds, no hepatosplenomegaly MSK: extremities are warm, 1+bilateral LE edema Skin: warm, no rash Neuro:  no focal deficits Psych: appropriate affect   Diagnostic Studies 08/2019 echo IMPRESSIONS     1. Left ventricular ejection fraction, by visual estimation, is <20%. The  left ventricle has severely decreased function. There is no left  ventricular hypertrophy.   2. The left ventricle demonstrates regional wall motion abnormalities.   3. LVEF is approximately 15 to 20% with severe hypokinesis and akinesis  if the distal 1/3 of left ventricle.. Only extreme proximal portions of LV  thicken during systole. Compared to echo from 2017, LVEF is now severely  depressed.   4. Global right ventricle has normal systolic function.The right  ventricular size is normal. No increase in right ventricular wall  thickness.   5. Left atrial size was normal.   6. Right atrial  size was normal.   7. Mild mitral annular calcification.   8. The mitral valve is abnormal. Trivial mitral valve regurgitation.   9.  The tricuspid valve is normal in structure. Tricuspid valve  regurgitation is mild.  10. The aortic valve is abnormal. Aortic valve regurgitation is not  visualized. Mild aortic valve sclerosis without stenosis.  11. The pulmonic valve was normal in structure. Pulmonic valve  regurgitation is not visualized.  12. The inferior vena cava is dilated in size with <50% respiratory  variability, suggesting right atrial pressure of 15 mmHg.      08/2019 cath LV end diastolic pressure is normal.   1. Mild nonobstructive CAD 2. Normal LV filling pressures 3. Normal right heart pressures. 4. Normal cardiac output. Index 2.7.      Assessment and Plan   1. Chronic systolic HF - sometime since last follow up - repeat echo. If ongoing dysfunction titrate entresto further, eventually start aldactone and SLGT2i. Did not tolerate higher coreg dosing.  - recheck bmet/mg/bnp  F/u 3 months  [name], M.D.

## 2021-08-03 NOTE — Patient Instructions (Signed)
Medication Instructions:  Your physician recommends that you continue on your current medications as directed. Please refer to the Current Medication list given to you today.  *If you need a refill on your cardiac medications before your next appointment, please call your pharmacy*   Lab Work: BMET MAG BNP If you have labs (blood work) drawn today and your tests are completely normal, you will receive your results only by: [place] (if you have MyChart) OR A paper copy in the mail If you have any lab test that is abnormal or we need to change your treatment, we will call you to review the results.   Testing/Procedures: Your physician has requested that you have an echocardiogram. Echocardiography is a painless test that uses sound waves to create images of your heart. It provides your doctor with information about the size and shape of your heart and how well your heart's chambers and valves are working. This procedure takes approximately one hour. There are no restrictions for this procedure.    Follow-Up: At [facility], you and your health needs are our priority.  As part of our continuing mission to provide you with exceptional heart care, we have created designated Provider Care Teams.  These Care Teams include your primary Cardiologist (physician) and Advanced Practice Providers (APPs -  Physician Assistants and Nurse Practitioners) who all work together to provide you with the care you need, when you need it.  We recommend signing up for the patient portal called "MyChart".  Sign up information is provided on this After Visit Summary.  MyChart is used to connect with patients for Virtual Visits (Telemedicine).  Patients are able to view lab/test results, encounter notes, upcoming appointments, etc.  Non-urgent messages can be sent to your provider as well.   To learn more about what you can do with MyChart, go to [email or web address].    Your next appointment:   3  month(s)  The format for your next appointment:   In Person  Provider:   [name], MD    Other Instructions

## 2021-08-07 ENCOUNTER — Telehealth: Payer: Self-pay

## 2021-08-07 ENCOUNTER — Encounter (HOSPITAL_COMMUNITY): Payer: Medicare Other | Admitting: Physical Therapy

## 2021-08-07 NOTE — Telephone Encounter (Signed)
Patient notified and verbalized understanding of results. Pt had no questions or concerns at this time.

## 2021-08-07 NOTE — Telephone Encounter (Signed)
-----   Message from [name], MD sent at 08/07/2021  2:27 PM EST ----- Labs show Mg is just a little low, can he increase his magnesium oxide to 416m tid x 4 days, then resume bid dosing   J BrancH MD

## 2021-08-09 ENCOUNTER — Encounter (HOSPITAL_COMMUNITY): Payer: Medicare Other | Admitting: Physical Therapy

## 2021-08-14 ENCOUNTER — Encounter (HOSPITAL_COMMUNITY): Payer: Medicare Other | Admitting: Physical Therapy

## 2021-08-16 ENCOUNTER — Encounter (HOSPITAL_COMMUNITY): Payer: Medicare Other | Admitting: Physical Therapy

## 2021-08-21 ENCOUNTER — Encounter (HOSPITAL_COMMUNITY): Payer: Medicare Other | Admitting: Physical Therapy

## 2021-08-21 DIAGNOSIS — R030 Elevated blood-pressure reading, without diagnosis of hypertension: Secondary | ICD-10-CM | POA: Diagnosis not present

## 2021-08-21 DIAGNOSIS — E119 Type 2 diabetes mellitus without complications: Secondary | ICD-10-CM | POA: Diagnosis not present

## 2021-08-21 DIAGNOSIS — Z79899 Other long term (current) drug therapy: Secondary | ICD-10-CM | POA: Diagnosis not present

## 2021-08-21 DIAGNOSIS — Z0000 Encounter for general adult medical examination without abnormal findings: Secondary | ICD-10-CM | POA: Diagnosis not present

## 2021-08-21 DIAGNOSIS — G894 Chronic pain syndrome: Secondary | ICD-10-CM | POA: Diagnosis not present

## 2021-08-21 DIAGNOSIS — M79606 Pain in leg, unspecified: Secondary | ICD-10-CM | POA: Diagnosis not present

## 2021-08-22 ENCOUNTER — Encounter (HOSPITAL_COMMUNITY): Payer: Medicare Other | Admitting: Physical Therapy

## 2021-08-28 ENCOUNTER — Encounter (HOSPITAL_COMMUNITY): Payer: Medicare Other | Admitting: Physical Therapy

## 2021-08-30 ENCOUNTER — Encounter (HOSPITAL_COMMUNITY): Payer: Medicare Other | Admitting: Physical Therapy

## 2021-08-31 ENCOUNTER — Encounter (HOSPITAL_COMMUNITY): Payer: Medicare Other

## 2021-09-03 ENCOUNTER — Encounter (HOSPITAL_COMMUNITY): Payer: Medicare Other | Admitting: Physical Therapy

## 2021-09-05 ENCOUNTER — Encounter (HOSPITAL_COMMUNITY): Payer: Medicare Other | Admitting: Physical Therapy

## 2021-09-07 ENCOUNTER — Encounter (HOSPITAL_COMMUNITY): Payer: Medicare Other

## 2021-09-10 ENCOUNTER — Encounter (HOSPITAL_COMMUNITY): Payer: Medicare Other | Admitting: Physical Therapy

## 2021-09-12 ENCOUNTER — Encounter (HOSPITAL_COMMUNITY): Payer: Medicare Other | Admitting: Physical Therapy

## 2021-09-14 ENCOUNTER — Encounter (HOSPITAL_COMMUNITY): Payer: Medicare Other

## 2021-09-17 ENCOUNTER — Encounter (HOSPITAL_COMMUNITY): Payer: Medicare Other | Admitting: Physical Therapy

## 2021-09-19 ENCOUNTER — Encounter (HOSPITAL_COMMUNITY): Payer: Medicare Other | Admitting: Physical Therapy

## 2021-09-19 ENCOUNTER — Ambulatory Visit (HOSPITAL_COMMUNITY): Payer: No Typology Code available for payment source

## 2021-09-21 ENCOUNTER — Encounter (HOSPITAL_COMMUNITY): Payer: Medicare Other

## 2021-10-10 ENCOUNTER — Other Ambulatory Visit: Payer: Self-pay | Admitting: Internal Medicine

## 2021-10-10 DIAGNOSIS — R350 Frequency of micturition: Secondary | ICD-10-CM

## 2021-10-10 DIAGNOSIS — N401 Enlarged prostate with lower urinary tract symptoms: Secondary | ICD-10-CM

## 2021-10-16 DIAGNOSIS — R030 Elevated blood-pressure reading, without diagnosis of hypertension: Secondary | ICD-10-CM | POA: Diagnosis not present

## 2021-10-16 DIAGNOSIS — E119 Type 2 diabetes mellitus without complications: Secondary | ICD-10-CM | POA: Diagnosis not present

## 2021-10-16 DIAGNOSIS — M79606 Pain in leg, unspecified: Secondary | ICD-10-CM | POA: Diagnosis not present

## 2021-10-16 DIAGNOSIS — Z79899 Other long term (current) drug therapy: Secondary | ICD-10-CM | POA: Diagnosis not present

## 2021-10-16 DIAGNOSIS — G894 Chronic pain syndrome: Secondary | ICD-10-CM | POA: Diagnosis not present

## 2021-10-18 ENCOUNTER — Encounter: Payer: Self-pay | Admitting: Orthopedic Surgery

## 2021-10-18 ENCOUNTER — Other Ambulatory Visit: Payer: Self-pay

## 2021-10-18 ENCOUNTER — Ambulatory Visit (INDEPENDENT_AMBULATORY_CARE_PROVIDER_SITE_OTHER): Payer: No Typology Code available for payment source | Admitting: Orthopedic Surgery

## 2021-10-18 DIAGNOSIS — M25562 Pain in left knee: Secondary | ICD-10-CM

## 2021-10-18 DIAGNOSIS — M25561 Pain in right knee: Secondary | ICD-10-CM

## 2021-10-18 DIAGNOSIS — I890 Lymphedema, not elsewhere classified: Secondary | ICD-10-CM | POA: Insufficient documentation

## 2021-10-18 DIAGNOSIS — G8929 Other chronic pain: Secondary | ICD-10-CM

## 2021-10-18 NOTE — Progress Notes (Signed)
Chief Complaint  Patient presents with   Injections    Both knees     Procedure note for bilateral knee injections  Procedure note left knee injection verbal consent was obtained to inject left knee joint  Timeout was completed to confirm the site of injection  The medications used were 40 mg depomedrol and 3 cc of 1% lidocaine  Anesthesia was provided by ethyl chloride and the skin was prepped with alcohol.  After cleaning the skin with alcohol a 20-gauge needle was used to inject the left knee joint. There were no complications. A sterile bandage was applied.   Procedure note right knee injection verbal consent was obtained to inject right knee joint  Timeout was completed to confirm the site of injection  The medications used were 40 mg depomedrol and 3 cc of 1% lidocaine  Anesthesia was provided by ethyl chloride and the skin was prepped with alcohol.  After cleaning the skin with alcohol a 20-gauge needle was used to inject the right knee joint. There were no complications. A sterile bandage was applied. Encounter Diagnoses  Name Primary?   Chronic pain of right knee Yes   Chronic pain of left knee    F/u 3 mos

## 2021-10-25 ENCOUNTER — Other Ambulatory Visit: Payer: Self-pay

## 2021-10-25 ENCOUNTER — Ambulatory Visit (HOSPITAL_COMMUNITY)
Admission: RE | Admit: 2021-10-25 | Discharge: 2021-10-25 | Disposition: A | Discharge status: Home / Self Care | Payer: Medicare Other | Source: Ambulatory Visit | Attending: Cardiology | Admitting: Cardiology

## 2021-10-25 DIAGNOSIS — I5022 Chronic systolic (congestive) heart failure: Secondary | ICD-10-CM | POA: Diagnosis not present

## 2021-10-25 LAB — ECHOCARDIOGRAM COMPLETE
AR max vel: 1.64 cm2
AV Area VTI: 1.56 cm2
AV Area mean vel: 1.77 cm2
AV Mean grad: 6 mmHg
AV Peak grad: 12.5 mmHg
Ao pk vel: 1.77 m/s
Area-P 1/2: 4.68 cm2
S' Lateral: 3.73 cm

## 2021-10-25 NOTE — Progress Notes (Signed)
*  PRELIMINARY RESULTS* Echocardiogram 2D Echocardiogram has been performed.  Michael [name] 10/25/2021, 10:18 AM

## 2021-10-30 DIAGNOSIS — R262 Difficulty in walking, not elsewhere classified: Secondary | ICD-10-CM | POA: Diagnosis not present

## 2021-10-30 DIAGNOSIS — I5032 Chronic diastolic (congestive) heart failure: Secondary | ICD-10-CM | POA: Diagnosis not present

## 2021-11-09 ENCOUNTER — Other Ambulatory Visit: Payer: Self-pay

## 2021-11-09 ENCOUNTER — Encounter: Payer: Self-pay | Admitting: Cardiology

## 2021-11-09 ENCOUNTER — Ambulatory Visit (INDEPENDENT_AMBULATORY_CARE_PROVIDER_SITE_OTHER): Payer: No Typology Code available for payment source | Admitting: Cardiology

## 2021-11-09 VITALS — BP 134/78 | HR 97 | Ht 59.0 in | Wt 214.2 lb

## 2021-11-09 DIAGNOSIS — I5022 Chronic systolic (congestive) heart failure: Secondary | ICD-10-CM | POA: Diagnosis not present

## 2021-11-09 DIAGNOSIS — I10 Essential (primary) hypertension: Secondary | ICD-10-CM

## 2021-11-09 NOTE — Progress Notes (Signed)
Clinical Summary Mr. Haisley is a 76 y.o.male seen today for follow up of the following medical problems.      1. Chronic combined systolic/ diastolic heart failure - several year history of diastolic HF. New diagnosis of systolic HF during 82/9937 admission 08/2019 echo: <20%, akinesis distal 1/3 08/2019 cath mild CAD, normal filling pressures       -last seen in clnic 01/2020 - no recent SOB/DOE - LE edema improving. He is on bumex 85m bid, metolazone 2.538mMon and Thurs     Jan 2023 echo: LVEF 50-55%. Grade II dd - chronic leg edema is up and down.  - he is on bumex 12m2mid and metolazone 2.5mg15mn and Thurs.  -upcoming labs with pcp     2. Lymphedema - difficultly getting transporation, did not complete sessions      [facility] [place]   SH: veteran, he is a [place]. Served in the [employer] Energyister is [name] also a patient of mine, recently passed away around labor day 2020Mar 13, 2020m pancreatic cancer     Past Medical History:  Diagnosis Date   Arthritis    Congestive heart disease (HCC)[place]/2017   COPD (chronic obstructive pulmonary disease) (HCC)[place] DDD (degenerative disc disease), lumbosacral    Demand ischemia (HCC)[place]/24/2020   Depression    Diabetic neuropathy (HCC)[place] Diastolic dysfunction    Essential hypertension    GERD (gastroesophageal reflux disease)    Hyperlipidemia    Iron deficiency anemia    Left knee DJD    Lumbar spinal stenosis    Lymphedema 02/203/14/17OTH LEGS   NASH (nonalcoholic steatohepatitis)    Peripheral edema    Peripheral edema 10/05/2015   Type 2 diabetes mellitus (HCC)      No Known Allergies   Current Outpatient Medications  Medication Sig Dispense Refill   allopurinol (ZYLOPRIM) 100 MG tablet Take 100 mg by mouth daily.     aspirin EC 81 MG tablet Take 81 mg by mouth daily.     baclofen (LIORESAL) 10 MG tablet Take 5 mg by mouth 3 (three) times daily as needed.      bumetanide (BUMEX) 2 MG tablet Take 1.5 tablets (3 mg total) by mouth 2 (two) times daily. 270 tablet 3   carvedilol (COREG) 12.5 MG tablet Take 1 tablet (12.5 mg total) by mouth 2 (two) times daily. 180 tablet 3   diclofenac sodium (VOLTAREN) 1 % GEL Apply 4 g topically 4 (four) times daily as needed (pain). 100 g 3   Ferrous Sulfate (IRON) 325 (65 FE) MG TABS Take 1 tablet by mouth daily.      gabapentin (NEURONTIN) 400 MG capsule Take 1 capsule (400 mg total) by mouth 3 (three) times daily. 90 capsule 0   HYDROcodone-acetaminophen (NORCO) 10-325 MG tablet Take 1 tablet by mouth 3 (three) times daily as needed.     Insulin Glargine (LANTUS SOLOSTAR) 100 UNIT/ML Solostar Pen Inject 5 Units into the skin daily at 10 pm. (Patient taking differently: Inject 25 Units into the skin at bedtime.) 15 mL 0   levothyroxine (SYNTHROID) 112 MCG tablet TAKE 1 TABLET BY MOUTH ONCE DAILY. 30 tablet 0   magnesium oxide (MAG-OX) 400 MG tablet Take 1 tablet (400 mg total) by mouth 2 (two) times daily. 60 tablet 11   meloxicam (MOBIC) 7.5 MG tablet Take 1 tablet (7.5 mg  total) by mouth daily. 30 tablet 5   metFORMIN (GLUCOPHAGE) 1000 MG tablet TAKE ONE TABLET BY MOUTH TWICE DAILY WITH A MEAL. 180 tablet 3   metolazone (ZAROXOLYN) 2.5 MG tablet Two Times Weekly on Monday and Thursday 10 tablet 11   nystatin cream (MYCOSTATIN) APPLY TO AFFECTED AREA TWICE DAILY. 30 g 0   oxyCODONE-Acetaminophen 10-300 MG/5ML SOLN TAKE 7.5MG BY MOUTH FOUR TIMES A DAY     pantoprazole (PROTONIX) 40 MG tablet Take 1 tablet (40 mg total) by mouth daily. 30 tablet 11   potassium chloride (K-DUR) 10 MEQ tablet Take 1 tablet (10 mEq total) by mouth 2 (two) times daily. 180 tablet 3   pravastatin (PRAVACHOL) 40 MG tablet TAKE ONE TABLET BY MOUTH DAILY. 90 tablet 0   sacubitril-valsartan (ENTRESTO) 49-51 MG Take 1 tablet by mouth 2 (two) times daily. 60 tablet 11   sulfamethoxazole-trimethoprim (BACTRIM DS) 800-160 MG tablet Take 1 tablet by  mouth 2 (two) times daily. 14 tablet 0   tamsulosin (FLOMAX) 0.4 MG CAPS capsule TAKE (1) CAPSULE BY MOUTH EVERY DAY. 90 capsule 3   tiZANidine (ZANAFLEX) 2 MG tablet Take 1 tablet (2 mg total) by mouth every 8 (eight) hours as needed for muscle spasms. 30 tablet 0   traZODone (DESYREL) 100 MG tablet TAKE 1 TABLET BY MOUTH AT BEDTIME AS NEEDED FOR SLEEP. 90 tablet 2   triamcinolone cream (KENALOG) 0.1 % Apply 1 application topically 2 (two) times daily. To arms/hand 30 g 2   Vitamin D, Ergocalciferol, (DRISDOL) 1.25 MG (50000 UNIT) CAPS capsule Take 1 capsule (50,000 Units total) by mouth every 7 (seven) days. 12 capsule 1   XTAMPZA ER 9 MG C12A Take 1 capsule by mouth 2 (two) times daily.     No current facility-administered medications for this visit.     Past Surgical History:  Procedure Laterality Date   BIOPSY N/A 07/21/2014   Procedure: BIOPSY;  Surgeon: Daneil Dolin, MD;  [facility];  Service: Endoscopy;  Laterality: N/A;   CATARACT EXTRACTION W/PHACO Right 07/08/2016   Procedure: CATARACT EXTRACTION PHACO AND INTRAOCULAR LENS PLACEMENT RIGHT EYE;  Surgeon: Tonny , MD;  Location: [facility];  Service: Ophthalmology;  Laterality: Right;  CDE: 9.01   CATARACT EXTRACTION W/PHACO Left 07/22/2016   Procedure: CATARACT EXTRACTION PHACO AND INTRAOCULAR LENS PLACEMENT LEFT EYE CDE=10.69;  Surgeon: Tonny , MD;  Location: [facility];  Service: Ophthalmology;  Laterality: Left;  left -    COLONOSCOPY  2011   [facility]: normal colon, normal distal ileum   COLONOSCOPY WITH PROPOFOL N/A 07/21/2014   Procedure: ATTEMPTED COLONOSCOPY WITH PROPOFOL-HAD TO STOP DUE TO BRADYCARDIA;  Surgeon: Daneil Dolin, MD;  Location: [facility];  Service: Endoscopy;  Laterality: N/A;   EGD with enteroscopy  2011   [facility]: normal esophagus and stomach. Normal duodenum, jejunum. No evidence of AVMs.    ESOPHAGOGASTRODUODENOSCOPY (EGD) WITH PROPOFOL N/A 07/21/2014   Procedure: ESOPHAGOGASTRODUODENOSCOPY (EGD)  WITH PROPOFOL;  Surgeon: Daneil Dolin, MD;  Location: [facility];  Service: Endoscopy;  Laterality: N/A;   HERNIA REPAIR     KNEE ARTHROSCOPY WITH MEDIAL MENISECTOMY Left 11/06/2012   Procedure: KNEE ARTHROSCOPY WITH MEDIAL MENISECTOMY;  Surgeon: Carole Civil, MD;  Location: [facility];  Service: Orthopedics;  Laterality: Left;   Lipoma removal     Stomach   LUMBAR LAMINECTOMY/DECOMPRESSION MICRODISCECTOMY Left 08/30/2013   Procedure: LUMBAR LAMINECTOMY/DECOMPRESSION MICRODISCECTOMY LEFT  LUMBAR TWO THREE;  Surgeon: Otilio Connors, MD;  Location: [place] [facility];  Service: Neurosurgery;  Laterality: Left;   RIGHT/LEFT HEART CATH AND CORONARY ANGIOGRAPHY N/A 09/22/2019   Procedure: RIGHT/LEFT HEART CATH AND CORONARY ANGIOGRAPHY;  Surgeon: Martinique, Peter M, MD;  Location: [place] [facility];  Service: Cardiovascular;  Laterality: N/A;   SHOULDER SURGERY     Rght-rotator cuff   TOOTH EXTRACTION       No Known Allergies    Family History  Problem Relation Age of Onset   Heart disease Mother    Hyperlipidemia Mother    Hypertension Mother    Depression Mother    Diabetes Mother    Rectal cancer Mother    Cancer Mother    Heart disease Father    Hypertension Father    Hyperlipidemia Father    Diabetes Father    Cancer Father    Heart disease Sister    Hyperlipidemia Sister    Hypertension Sister    Diabetes Sister    Diabetes Brother    Heart disease Sister    Hyperlipidemia Sister    Hypertension Sister    Heart disease Sister    Hyperlipidemia Sister    Hypertension Sister    Diabetes Sister    Diabetes Brother      Social History Mr. Wicklund reports that he quit smoking about 22 years ago. His smoking use included cigarettes. He started smoking about 66 years ago. He has a 110.00 pack-year smoking history. He has never used smokeless tobacco. Mr. Buckner reports no history of alcohol use.   Review of Systems CONSTITUTIONAL: No weight loss, fever, chills, weakness or  fatigue.  HEENT: Eyes: No visual loss, blurred vision, double vision or yellow sclerae.No hearing loss, sneezing, congestion, runny nose or sore throat.  SKIN: No rash or itching.  CARDIOVASCULAR: per hpi RESPIRATORY: No shortness of breath, cough or sputum.  GASTROINTESTINAL: No anorexia, nausea, vomiting or diarrhea. No abdominal pain or blood.  GENITOURINARY: No burning on urination, no polyuria NEUROLOGICAL: No headache, dizziness, syncope, paralysis, ataxia, numbness or tingling in the extremities. No change in bowel or bladder control.  MUSCULOSKELETAL: No muscle, back pain, joint pain or stiffness.  LYMPHATICS: No enlarged nodes. No history of splenectomy.  PSYCHIATRIC: No history of depression or anxiety.  ENDOCRINOLOGIC: No reports of sweating, cold or heat intolerance. No polyuria or polydipsia.  Marland Kitchen   Physical Examination Today's Vitals   11/09/21 0845  BP: (!) 144/84  Pulse: 97  SpO2: 97%  Weight: 214 lb 3.2 oz (97.2 kg)  Height: 4' 11"  (1.499 m)   Body mass index is 43.26 kg/m.  Gen: resting comfortably, no acute distress HEENT: no scleral icterus, pupils equal round and reactive, no palptable cervical adenopathy,  CV: RRR, no m/r/g no jvd Resp: Clear to auscultation bilaterally GI: abdomen is soft, non-tender, non-distended, normal bowel sounds, no hepatosplenomegaly MSK: extremities are warm, no edema.  Skin: warm, no rash Neuro:  no focal deficits Psych: appropriate affect   Diagnostic Studies  08/2019 echo IMPRESSIONS     1. Left ventricular ejection fraction, by visual estimation, is <20%. The  left ventricle has severely decreased function. There is no left  ventricular hypertrophy.   2. The left ventricle demonstrates regional wall motion abnormalities.   3. LVEF is approximately 15 to 20% with severe hypokinesis and akinesis  if the distal 1/3 of left ventricle.. Only extreme proximal portions of LV  thicken during systole. Compared to echo from  2017, LVEF is now severely  depressed.   4.  Global right ventricle has normal systolic function.The right  ventricular size is normal. No increase in right ventricular wall  thickness.   5. Left atrial size was normal.   6. Right atrial size was normal.   7. Mild mitral annular calcification.   8. The mitral valve is abnormal. Trivial mitral valve regurgitation.   9. The tricuspid valve is normal in structure. Tricuspid valve  regurgitation is mild.  10. The aortic valve is abnormal. Aortic valve regurgitation is not  visualized. Mild aortic valve sclerosis without stenosis.  11. The pulmonic valve was normal in structure. Pulmonic valve  regurgitation is not visualized.  12. The inferior vena cava is dilated in size with <50% respiratory  variability, suggesting right atrial pressure of 15 mmHg.      08/2019 cath LV end diastolic pressure is normal.   1. Mild nonobstructive CAD 2. Normal LV filling pressures 3. Normal right heart pressures. 4. Normal cardiac output. Index 2.7.      Jan 2023 echo  1. Left ventricular ejection fraction, by estimation, is 50 to 55%. The  left ventricle has low normal function. The left ventricle demonstrates  regional wall motion abnormalities (see scoring diagram/findings for  description). Left ventricular diastolic   parameters are consistent with Grade II diastolic dysfunction  (pseudonormalization). Elevated left ventricular end-diastolic pressure.   2. Right ventricular systolic function is normal. The right ventricular  size is normal. Tricuspid regurgitation signal is inadequate for assessing  PA pressure.   3. Left atrial size was moderately dilated.   4. The mitral valve is degenerative. Mild mitral valve regurgitation.   5. The aortic valve is tricuspid. There is mild calcification of the  aortic valve. Aortic valve regurgitation is trivial. Aortic valve  sclerosis/calcification is present, without any evidence of aortic  stenosis.  Aortic valve mean gradient measures 6.0  mmHg.   6. The inferior vena cava is normal in size with greater than 50%  respiratory variability, suggesting right atrial pressure of 3 mmHg.    Assessment and Plan  1. Chronic systolic/diastolic HF - repeat echo shows LVEF has normalized, ongoing diastolic dysfunction - chronic LE edema is multifactorial due to HF and lymphedema, overall stable. Continue current diuretics  2. HTN - essentially at goal, continue current meds      [name], M.D.

## 2021-11-09 NOTE — Patient Instructions (Addendum)
Follow-Up: Follow up with Dr. Harl Bowie in 6 months  Any Other Special Instructions Will Be Listed Below (If Applicable).     If you need a refill on your cardiac medications before your next appointment, please call your pharmacy.

## 2021-11-22 DIAGNOSIS — R030 Elevated blood-pressure reading, without diagnosis of hypertension: Secondary | ICD-10-CM | POA: Diagnosis not present

## 2021-11-22 DIAGNOSIS — E119 Type 2 diabetes mellitus without complications: Secondary | ICD-10-CM | POA: Diagnosis not present

## 2021-11-22 DIAGNOSIS — Z79899 Other long term (current) drug therapy: Secondary | ICD-10-CM | POA: Diagnosis not present

## 2021-11-22 DIAGNOSIS — G894 Chronic pain syndrome: Secondary | ICD-10-CM | POA: Diagnosis not present

## 2021-11-22 DIAGNOSIS — M79606 Pain in leg, unspecified: Secondary | ICD-10-CM | POA: Diagnosis not present

## 2021-12-11 ENCOUNTER — Ambulatory Visit: Payer: Medicare Other | Admitting: Internal Medicine

## 2021-12-21 ENCOUNTER — Ambulatory Visit (INDEPENDENT_AMBULATORY_CARE_PROVIDER_SITE_OTHER): Payer: Medicare Other | Admitting: Internal Medicine

## 2021-12-21 ENCOUNTER — Encounter: Payer: Self-pay | Admitting: Internal Medicine

## 2021-12-21 ENCOUNTER — Other Ambulatory Visit: Payer: Self-pay

## 2021-12-21 VITALS — BP 138/84 | HR 86 | Resp 18 | Ht 59.0 in | Wt 219.4 lb

## 2021-12-21 DIAGNOSIS — E1142 Type 2 diabetes mellitus with diabetic polyneuropathy: Secondary | ICD-10-CM

## 2021-12-21 DIAGNOSIS — R350 Frequency of micturition: Secondary | ICD-10-CM

## 2021-12-21 DIAGNOSIS — J449 Chronic obstructive pulmonary disease, unspecified: Secondary | ICD-10-CM | POA: Diagnosis not present

## 2021-12-21 DIAGNOSIS — F320 Major depressive disorder, single episode, mild: Secondary | ICD-10-CM

## 2021-12-21 DIAGNOSIS — R5381 Other malaise: Secondary | ICD-10-CM | POA: Diagnosis not present

## 2021-12-21 DIAGNOSIS — Z794 Long term (current) use of insulin: Secondary | ICD-10-CM | POA: Diagnosis not present

## 2021-12-21 DIAGNOSIS — G894 Chronic pain syndrome: Secondary | ICD-10-CM

## 2021-12-21 DIAGNOSIS — I890 Lymphedema, not elsewhere classified: Secondary | ICD-10-CM | POA: Diagnosis not present

## 2021-12-21 DIAGNOSIS — L309 Dermatitis, unspecified: Secondary | ICD-10-CM

## 2021-12-21 DIAGNOSIS — N401 Enlarged prostate with lower urinary tract symptoms: Secondary | ICD-10-CM

## 2021-12-21 DIAGNOSIS — I5042 Chronic combined systolic (congestive) and diastolic (congestive) heart failure: Secondary | ICD-10-CM | POA: Diagnosis not present

## 2021-12-21 MED ORDER — TRIAMCINOLONE ACETONIDE 0.1 % EX CREA: 1.0000 "application " | TOPICAL_CREAM | Freq: Two times a day (BID) | CUTANEOUS | 0 refills | Status: DC

## 2021-12-21 MED ORDER — HYDROCODONE-ACETAMINOPHEN 10-325 MG PO TABS: 1.0000 | ORAL_TABLET | Freq: Three times a day (TID) | ORAL | 0 refills | Status: DC | PRN

## 2021-12-21 NOTE — Assessment & Plan Note (Signed)
Chronic ?Applies compression bandages ?Referred to home PT ?On diuretics ?

## 2021-12-21 NOTE — Assessment & Plan Note (Signed)
Lab Results  ?Component Value Date  ? HGBA1C 7.3 (H) 04/13/2021  ? ?On Lantus 25 U qHS and Metformin, followed by [facility] clinic ?On ARNI and statin ?Has diabetic neuropathy - on Gabapentin ?

## 2021-12-21 NOTE — Assessment & Plan Note (Signed)
Mild COPD ?Albuterol PRN ?

## 2021-12-21 NOTE — Assessment & Plan Note (Signed)
Likely muscle atrophy from limited mobility from polyarthritis, lumbar spinal stenosis and DM neuropathy ?Referred to home PT ?

## 2021-12-21 NOTE — Progress Notes (Addendum)
? ?Established Patient Office Visit ? ?Subjective:  ?Patient ID: Michael [name], male    DOB: [birth year]  Age: 76 y.o. MRN: [id number] ? ?CC:  ?Chief Complaint  ?Patient presents with  ? Follow-up  ?  5 month follow up DM and BPH pt legs are hurting are really swollen with blisters  ? ? ?HPI ?Michael [name] is a 76 y.o. male with past medical history of HTN, HFrEF, DM with neuropathy, COPD, GERD, hypothyroidism, chronic pain syndrome and chronic lymphedema who presents for f/u of his chronic medical conditions. ? ?His HbA1C was 7.3 recently. He has been taking Lantus 25 U qHS and Metformin. Denies any polyphagia, but admits having chronic fatigue. ? ?He takes hydrocodone-acetaminophen for chronic pain.  [name] for [facility] management.  He requests if he can get pain medications from [facility] as it is difficult for him to travel to [facility], [facility], [facility] for pain management. ? ?HTN: His BP is well controlled today.  Denies any headache, dizziness, chest pain or palpitations currently. ? ?Lymphedema: He has chronic bilateral leg swelling, for which he was getting physical therapy through [place].  He has history of HFrEF, for which he takes Entresto, Bumex and metolazone.  He has very limited mobility currently.  He has seen recent worsening of his leg swelling as he could not get his physical therapy recently.  He also complains of weeping edema and blisters recently. ? ?BPH: He reports improvement in his nocturia and urinary stream with Flomax now.  He still reports mild dysuria at times.  Denies any hematuria currently. ? ?Eczema: He reports having skin rash and itching over bilateral UE and a spot on his abdomen. ? ?Past Medical History:  ?Diagnosis Date  ? Arthritis   ? Congestive heart disease ([place]) 11/2015  ? COPD (chronic obstructive pulmonary disease) ([place])   ? DDD (degenerative disc disease), lumbosacral   ? Demand ischemia ([place]) 09/23/2019  ? Depression   ? Diabetic neuropathy ([place])   ?  Diastolic dysfunction   ? Essential hypertension   ? GERD (gastroesophageal reflux disease)   ? Hyperlipidemia   ? Iron deficiency anemia   ? Left knee DJD   ? Lumbar spinal stenosis   ? Lymphedema 11/2015  ? BOTH LEGS  ? NASH (nonalcoholic steatohepatitis)   ? Peripheral edema   ? Peripheral edema 10/05/2015  ? Type 2 diabetes mellitus ([place])   ? ? ?Past Surgical History:  ?Procedure Laterality Date  ? BIOPSY N/A 07/21/2014  ? Procedure: BIOPSY;  Surgeon: Daneil Dolin, MD;  Location: [facility];  Service: Endoscopy;  Laterality: N/A;  ? CATARACT EXTRACTION W/PHACO Right 07/08/2016  ? Procedure: CATARACT EXTRACTION PHACO AND INTRAOCULAR LENS PLACEMENT RIGHT EYE;  Surgeon: Michael Branch, MD;  Location: [facility];  Service: Ophthalmology;  Laterality: Right;  CDE: 9.01  ? CATARACT EXTRACTION W/PHACO Left 07/22/2016  ? Procedure: CATARACT EXTRACTION PHACO AND INTRAOCULAR LENS PLACEMENT LEFT EYE CDE=10.69;  Surgeon: Michael Branch, MD;  Location: [facility];  Service: Ophthalmology;  Laterality: Left;  left -   ? COLONOSCOPY  2011  ? [place]: normal colon, normal distal ileum  ? COLONOSCOPY WITH PROPOFOL N/A 07/21/2014  ? Procedure: ATTEMPTED COLONOSCOPY WITH PROPOFOL-HAD TO STOP DUE TO BRADYCARDIA;  Surgeon: Daneil Dolin, MD;  Location: [facility];  Service: Endoscopy;  Laterality: N/A;  ? EGD with enteroscopy  2011  ? [place]: normal esophagus and stomach. Normal duodenum, jejunum. No evidence of AVMs.   ?  ESOPHAGOGASTRODUODENOSCOPY (EGD) WITH PROPOFOL N/A 07/21/2014  ? Procedure: ESOPHAGOGASTRODUODENOSCOPY (EGD) WITH PROPOFOL;  Surgeon: Daneil Dolin, MD;  [facility]: [facility];  Service: Endoscopy;  Laterality: N/A;  ? HERNIA REPAIR    ? KNEE ARTHROSCOPY WITH MEDIAL MENISECTOMY Left 11/06/2012  ? Procedure: KNEE ARTHROSCOPY WITH MEDIAL MENISECTOMY;  Surgeon: Michael Civil, MD;  Location: [facility];  Service: Orthopedics;  Laterality: Left;  ? Lipoma removal    ? Stomach  ? LUMBAR LAMINECTOMY/DECOMPRESSION MICRODISCECTOMY Left 08/30/2013   ? Procedure: LUMBAR LAMINECTOMY/DECOMPRESSION MICRODISCECTOMY LEFT  LUMBAR TWO THREE;  Surgeon: Michael Connors, MD;  Location: [place] [facility];  Service: Neurosurgery;  Laterality: Left;  ? RIGHT/LEFT HEART CATH AND CORONARY ANGIOGRAPHY N/A 09/22/2019  ? Procedure: RIGHT/LEFT HEART CATH AND CORONARY ANGIOGRAPHY;  Surgeon: Martinique, Peter M, MD;  Location: [place] [facility];  Service: Cardiovascular;  Laterality: N/A;  ? SHOULDER SURGERY    ? Rght-rotator cuff  ? TOOTH EXTRACTION    ? ? ?Family History  ?Problem Relation Age of Onset  ? Heart disease Mother   ? Hyperlipidemia Mother   ? Hypertension Mother   ? Depression Mother   ? Diabetes Mother   ? Rectal cancer Mother   ? Cancer Mother   ? Heart disease Father   ? Hypertension Father   ? Hyperlipidemia Father   ? Diabetes Father   ? Cancer Father   ? Heart disease Sister   ? Hyperlipidemia Sister   ? Hypertension Sister   ? Diabetes Sister   ? Diabetes Brother   ? Heart disease Sister   ? Hyperlipidemia Sister   ? Hypertension Sister   ? Heart disease Sister   ? Hyperlipidemia Sister   ? Hypertension Sister   ? Diabetes Sister   ? Diabetes Brother   ? ? ?Social History  ? ?Socioeconomic History  ? Marital status: Widowed  ?  Spouse name: Not on file  ? Number of children: Not on file  ? Years of education: 54  ? Highest education level: Not on file  ?Occupational History  ? Not on file  ?Tobacco Use  ? Smoking status: Former  ?  Packs/day: 2.50  ?  Years: 44.00  ?  Pack years: 110.00  ?  Types: Cigarettes  ?  Start date: 05/07/1955  ?  Quit date: 10/01/1999  ?  Years since quitting: 22.2  ? Smokeless tobacco: Never  ?Vaping Use  ? Vaping Use: Never used  ?Substance and Sexual Activity  ? Alcohol use: No  ?  Alcohol/week: 0.0 standard drinks  ? Drug use: No  ? Sexual activity: Yes  ?  Birth control/protection: None  ?Other Topics Concern  ? Not on file  ?Social History Narrative  ? Not on file  ? ?Social Determinants of Health  ? ?Financial Resource Strain: Low Risk   ?  Difficulty of Paying Living Expenses: Not hard at all  ?Food Insecurity: No Food Insecurity  ? Worried About Charity fundraiser in the Last Year: Never true  ? Ran Out of Food in the Last Year: Never true  ?Transportation Needs: No Transportation Needs  ? Lack of Transportation (Medical): No  ? Lack of Transportation (Non-Medical): No  ?Physical Activity: Insufficiently Active  ? Days of Exercise per Week: 4 days  ? Minutes of Exercise per Session: 30 min  ?Stress: No Stress Concern Present  ? Feeling of Stress : Not at all  ?Social Connections: Moderately Isolated  ? Frequency of Communication with  Friends and Family: More than three times a week  ? Frequency of Social Gatherings with Friends and Family: More than three times a week  ? Attends Religious Services: 1 to 4 times per year  ? Active Member of Clubs or Organizations: No  ? Attends Archivist Meetings: Never  ? Marital Status: Widowed  ?Intimate Partner Violence: Not At Risk  ? Fear of Current or Ex-Partner: No  ? Emotionally Abused: No  ? Physically Abused: No  ? Sexually Abused: No  ? ? ?Outpatient Medications Prior to Visit  ?Medication Sig Dispense Refill  ? allopurinol (ZYLOPRIM) 100 MG tablet Take 100 mg by mouth daily.    ? aspirin EC 81 MG tablet Take 81 mg by mouth daily.    ? baclofen (LIORESAL) 10 MG tablet Take 5 mg by mouth 3 (three) times daily as needed.    ? bumetanide (BUMEX) 2 MG tablet Take 1.5 tablets (3 mg total) by mouth 2 (two) times daily. 270 tablet 3  ? carvedilol (COREG) 12.5 MG tablet Take 1 tablet (12.5 mg total) by mouth 2 (two) times daily. 180 tablet 3  ? diclofenac sodium (VOLTAREN) 1 % GEL Apply 4 g topically 4 (four) times daily as needed (pain). 100 g 3  ? Ferrous Sulfate (IRON) 325 (65 FE) MG TABS Take 1 tablet by mouth daily.     ? gabapentin (NEURONTIN) 400 MG capsule Take 1 capsule (400 mg total) by mouth 3 (three) times daily. 90 capsule 0  ? Insulin Glargine (LANTUS SOLOSTAR) 100 UNIT/ML Solostar Pen  Inject 5 Units into the skin daily at 10 pm. (Patient taking differently: Inject 25 Units into the skin at bedtime.) 15 mL 0  ? levothyroxine (SYNTHROID) 112 MCG tablet TAKE 1 TABLET BY MOUTH ONCE DAILY. 30 ta

## 2021-12-21 NOTE — Assessment & Plan Note (Signed)
Has limited mobility ?Needs to follow low-carb diet ?

## 2021-12-21 NOTE — Patient Instructions (Signed)
Please start taking Hydrocodone-acetaminophen 3 times daily as needed for pain. ? ?Please continue to take other medications as prescribed. ? ?Please apply Kenalog cream for skin rash. ? ?Please try to keep legs elevated at home for leg swelling. ?

## 2021-12-21 NOTE — Assessment & Plan Note (Signed)
Has diabetic neuropathy and lumbar spinal stenosis with polyarthritis ?Will continue Norco 10-325 mg, but at 3 times daily dose instead of 4 times daily dose-patient agrees ?Has been following up with [facility], but will take over pain management for him as it is more convenient for him to come to [place] clinic ?

## 2021-12-21 NOTE — Assessment & Plan Note (Signed)
Has chronic nocturia and urinary frequency ?Started Tamsulosin -now better ?Does not want to see Urology despite elevated PSA ?

## 2021-12-21 NOTE — Assessment & Plan Note (Signed)
Has chronic lymphedema ?No dyspnea currently ?On Entresto, Coreg ?Bumenatide and Metolazone ?Follows up with Cardiology ?

## 2021-12-25 DIAGNOSIS — E119 Type 2 diabetes mellitus without complications: Secondary | ICD-10-CM | POA: Diagnosis not present

## 2021-12-25 DIAGNOSIS — M79606 Pain in leg, unspecified: Secondary | ICD-10-CM | POA: Diagnosis not present

## 2021-12-25 DIAGNOSIS — G894 Chronic pain syndrome: Secondary | ICD-10-CM | POA: Diagnosis not present

## 2021-12-25 DIAGNOSIS — R030 Elevated blood-pressure reading, without diagnosis of hypertension: Secondary | ICD-10-CM | POA: Diagnosis not present

## 2021-12-27 ENCOUNTER — Other Ambulatory Visit: Payer: Self-pay | Admitting: Internal Medicine

## 2021-12-27 DIAGNOSIS — N390 Urinary tract infection, site not specified: Secondary | ICD-10-CM

## 2021-12-27 LAB — CMP14+EGFR
ALT: 26 IU/L (ref 0–44)
AST: 25 IU/L (ref 0–40)
Albumin/Globulin Ratio: 1.9 (ref 1.2–2.2)
Albumin: 4.3 g/dL (ref 3.7–4.7)
Alkaline Phosphatase: 76 IU/L (ref 44–121)
BUN/Creatinine Ratio: 12 (ref 10–24)
BUN: 10 mg/dL (ref 8–27)
Bilirubin Total: 0.4 mg/dL (ref 0.0–1.2)
CO2: 24 mmol/L (ref 20–29)
Calcium: 9.7 mg/dL (ref 8.6–10.2)
Chloride: 99 mmol/L (ref 96–106)
Creatinine, Ser: 0.81 mg/dL (ref 0.76–1.27)
Globulin, Total: 2.3 g/dL (ref 1.5–4.5)
Glucose: 141 mg/dL — ABNORMAL HIGH (ref 70–99)
Potassium: 4.7 mmol/L (ref 3.5–5.2)
Sodium: 137 mmol/L (ref 134–144)
Total Protein: 6.6 g/dL (ref 6.0–8.5)
eGFR: 93 mL/min/{1.73_m2} (ref >59)

## 2021-12-27 LAB — URINE CULTURE, REFLEX

## 2021-12-27 LAB — UA/M W/RFLX CULTURE, ROUTINE
Bilirubin, UA: NEGATIVE
Glucose, UA: NEGATIVE
Ketones, UA: NEGATIVE
Nitrite, UA: NEGATIVE
RBC, UA: NEGATIVE
Specific Gravity, UA: 1.009 (ref 1.005–1.030)
Urobilinogen, Ur: 0.2 mg/dL (ref 0.2–1.0)
pH, UA: 7.5 (ref 5.0–7.5)

## 2021-12-27 LAB — HEMOGLOBIN A1C
Est. average glucose Bld gHb Est-mCnc: 174 mg/dL
Hgb A1c MFr Bld: 7.7 % — ABNORMAL HIGH (ref 4.8–5.6)

## 2021-12-27 LAB — MICROSCOPIC EXAMINATION
Bacteria, UA: NONE SEEN
Casts: NONE SEEN /lpf
Epithelial Cells (non renal): NONE SEEN /hpf (ref 0–10)
RBC, Urine: NONE SEEN /hpf (ref 0–2)

## 2021-12-27 MED ORDER — NITROFURANTOIN MONOHYD MACRO 100 MG PO CAPS: 100.0000 mg | ORAL_CAPSULE | Freq: Two times a day (BID) | ORAL | 0 refills | Status: DC

## 2022-01-15 ENCOUNTER — Other Ambulatory Visit: Payer: Self-pay | Admitting: Internal Medicine

## 2022-01-15 DIAGNOSIS — G894 Chronic pain syndrome: Secondary | ICD-10-CM

## 2022-01-29 LAB — HM DIABETES EYE EXAM

## 2022-01-30 ENCOUNTER — Encounter: Payer: Self-pay | Admitting: *Deleted

## 2022-02-14 ENCOUNTER — Other Ambulatory Visit: Payer: Self-pay | Admitting: Internal Medicine

## 2022-02-14 DIAGNOSIS — G894 Chronic pain syndrome: Secondary | ICD-10-CM

## 2022-03-11 ENCOUNTER — Telehealth: Payer: Self-pay | Admitting: Internal Medicine

## 2022-03-11 NOTE — Telephone Encounter (Signed)
Can you please ask the patient to describe the pain? Also, is he taking his gabapentin 414m?

## 2022-03-11 NOTE — Telephone Encounter (Signed)
Please advice  

## 2022-03-11 NOTE — Telephone Encounter (Signed)
Michael [name], with [place], call back # [phone], call stating pt has c/o pain in both legs. He is requesting something for pain.     [place]

## 2022-03-12 NOTE — Telephone Encounter (Signed)
Spoke with patient, states he has lymphedema in both legs, states he has blisters that come and go which havent popped yet, states he ran out of hydrocodone.

## 2022-03-14 ENCOUNTER — Other Ambulatory Visit: Payer: Self-pay | Admitting: Family Medicine

## 2022-03-14 DIAGNOSIS — G894 Chronic pain syndrome: Secondary | ICD-10-CM

## 2022-03-14 MED ORDER — HYDROCODONE-ACETAMINOPHEN 10-325 MG PO TABS: ORAL_TABLET | ORAL | 0 refills | Status: DC

## 2022-03-14 NOTE — Telephone Encounter (Signed)
Medication sent, he can pick up the prescription at his pharmacy

## 2022-03-14 NOTE — Telephone Encounter (Signed)
Pt informed

## 2022-03-20 ENCOUNTER — Encounter: Payer: Self-pay | Admitting: Internal Medicine

## 2022-03-20 ENCOUNTER — Ambulatory Visit (INDEPENDENT_AMBULATORY_CARE_PROVIDER_SITE_OTHER): Payer: Medicare Other | Admitting: Internal Medicine

## 2022-03-20 VITALS — BP 124/86 | HR 98 | Resp 18 | Ht 59.0 in

## 2022-03-20 DIAGNOSIS — K219 Gastro-esophageal reflux disease without esophagitis: Secondary | ICD-10-CM

## 2022-03-20 DIAGNOSIS — E1142 Type 2 diabetes mellitus with diabetic polyneuropathy: Secondary | ICD-10-CM

## 2022-03-20 DIAGNOSIS — G894 Chronic pain syndrome: Secondary | ICD-10-CM | POA: Diagnosis not present

## 2022-03-20 DIAGNOSIS — Z794 Long term (current) use of insulin: Secondary | ICD-10-CM | POA: Diagnosis not present

## 2022-03-20 DIAGNOSIS — I890 Lymphedema, not elsewhere classified: Secondary | ICD-10-CM | POA: Diagnosis not present

## 2022-03-20 DIAGNOSIS — I10 Essential (primary) hypertension: Secondary | ICD-10-CM | POA: Diagnosis not present

## 2022-03-20 DIAGNOSIS — F1190 Opioid use, unspecified, uncomplicated: Secondary | ICD-10-CM

## 2022-03-20 DIAGNOSIS — L304 Erythema intertrigo: Secondary | ICD-10-CM | POA: Diagnosis not present

## 2022-03-20 MED ORDER — HYDROCODONE-ACETAMINOPHEN 10-325 MG PO TABS: 1.0000 | ORAL_TABLET | Freq: Three times a day (TID) | ORAL | 0 refills | Status: DC | PRN

## 2022-03-20 MED ORDER — CLOTRIMAZOLE-BETAMETHASONE 1-0.05 % EX CREA: 1.0000 | TOPICAL_CREAM | Freq: Every day | CUTANEOUS | 0 refills | Status: DC

## 2022-03-20 NOTE — Progress Notes (Unsigned)
Established Patient Office Visit  Subjective:  Patient ID: Michael [name], male    DOB: Feb 02, 1946  Age: 76 y.o. MRN: [id number]  CC:  Chief Complaint  Patient presents with   Follow-up    3 month follow up pt is having pain in lower legs they are swollen and cracking in the creases    HPI Michael [name] is a 76 y.o. male with past medical history of HTN, HFrEF, DM with neuropathy, COPD, GERD, hypothyroidism, chronic pain syndrome and chronic lymphedema who presents for f/u of his chronic medical conditions.  Past Medical History:  Diagnosis Date   Arthritis    Congestive heart disease ([place]) 11/2015   COPD (chronic obstructive pulmonary disease) (HCC)    DDD (degenerative disc disease), lumbosacral    Demand ischemia ([place]) 09/23/2019   Depression    Diabetic neuropathy (HCC)    Diastolic dysfunction    Essential hypertension    GERD (gastroesophageal reflux disease)    Hyperlipidemia    Iron deficiency anemia    Left knee DJD    Lumbar spinal stenosis    Lymphedema 11/2015   BOTH LEGS   NASH (nonalcoholic steatohepatitis)    Peripheral edema    Peripheral edema 10/05/2015   Type 2 diabetes mellitus ([place])     Past Surgical History:  Procedure Laterality Date   BIOPSY N/A 07/21/2014   Procedure: BIOPSY;  Surgeon: Daneil Dolin, MD;  Location: [facility];  Service: Endoscopy;  Laterality: N/A;   CATARACT EXTRACTION W/PHACO Right 07/08/2016   Procedure: CATARACT EXTRACTION PHACO AND INTRAOCULAR LENS PLACEMENT RIGHT EYE;  Surgeon: Tonny Branch, MD;  Location: [facility];  Service: Ophthalmology;  Laterality: Right;  CDE: 9.01   CATARACT EXTRACTION W/PHACO Left 07/22/2016   Procedure: CATARACT EXTRACTION PHACO AND INTRAOCULAR LENS PLACEMENT LEFT EYE CDE=10.69;  Surgeon: Tonny Branch, MD;  Location: [facility];  Service: Ophthalmology;  Laterality: Left;  left -    COLONOSCOPY  2011   [facility]: normal colon, normal distal ileum   COLONOSCOPY WITH PROPOFOL N/A 07/21/2014   Procedure:  ATTEMPTED COLONOSCOPY WITH PROPOFOL-HAD TO STOP DUE TO BRADYCARDIA;  Surgeon: Daneil Dolin, MD;  Location: [facility];  Service: Endoscopy;  Laterality: N/A;   EGD with enteroscopy  2011   [facility]: normal esophagus and stomach. Normal duodenum, jejunum. No evidence of AVMs.    ESOPHAGOGASTRODUODENOSCOPY (EGD) WITH PROPOFOL N/A 07/21/2014   Procedure: ESOPHAGOGASTRODUODENOSCOPY (EGD) WITH PROPOFOL;  Surgeon: Daneil Dolin, MD;  Location: [facility];  Service: Endoscopy;  Laterality: N/A;   HERNIA REPAIR     KNEE ARTHROSCOPY WITH MEDIAL MENISECTOMY Left 11/06/2012   Procedure: KNEE ARTHROSCOPY WITH MEDIAL MENISECTOMY;  Surgeon: Carole Civil, MD;  Location: [facility];  Service: Orthopedics;  Laterality: Left;   Lipoma removal     Stomach   LUMBAR LAMINECTOMY/DECOMPRESSION MICRODISCECTOMY Left 08/30/2013   Procedure: LUMBAR LAMINECTOMY/DECOMPRESSION MICRODISCECTOMY LEFT  LUMBAR TWO THREE;  Surgeon: Otilio Connors, MD;  Location: [facility];  Service: Neurosurgery;  Laterality: Left;   RIGHT/LEFT HEART CATH AND CORONARY ANGIOGRAPHY N/A 09/22/2019   Procedure: RIGHT/LEFT HEART CATH AND CORONARY ANGIOGRAPHY;  Surgeon: Martinique, Peter M, MD;  Location: [facility];  Service: Cardiovascular;  Laterality: N/A;   SHOULDER SURGERY     Rght-rotator cuff   TOOTH EXTRACTION      Family History  Problem Relation Age of Onset   Heart disease Mother    Hyperlipidemia Mother    Hypertension Mother  Depression Mother    Diabetes Mother    Rectal cancer Mother    Cancer Mother    Heart disease Father    Hypertension Father    Hyperlipidemia Father    Diabetes Father    Cancer Father    Heart disease Sister    Hyperlipidemia Sister    Hypertension Sister    Diabetes Sister    Diabetes Brother    Heart disease Sister    Hyperlipidemia Sister    Hypertension Sister    Heart disease Sister    Hyperlipidemia Sister    Hypertension Sister    Diabetes Sister    Diabetes Brother     Social  History   Socioeconomic History   Marital status: Widowed    Spouse name: Not on file   Number of children: Not on file   Years of education: 12   Highest education level: Not on file  Occupational History   Not on file  Tobacco Use   Smoking status: Former    Packs/day: 2.50    Years: 44.00    Total pack years: 110.00    Types: Cigarettes    Start date: 05/07/1955    Quit date: 10/01/1999    Years since quitting: 22.4   Smokeless tobacco: Never  Vaping Use   Vaping Use: Never used  Substance and Sexual Activity   Alcohol use: No    Alcohol/week: 0.0 standard drinks of alcohol   Drug use: No   Sexual activity: Yes    Birth control/protection: None  Other Topics Concern   Not on file  Social History Narrative   Not on file   Social Determinants of Health   Financial Resource Strain: Low Risk  (04/17/2021)   Overall Financial Resource Strain (CARDIA)    Difficulty of Paying Living Expenses: Not hard at all  Food Insecurity: No Food Insecurity (04/17/2021)   Hunger Vital Sign    Worried About Running Out of Food in the Last Year: Never true    Ran Out of Food in the Last Year: Never true  Transportation Needs: No Transportation Needs (04/17/2021)   PRAPARE - Hydrologist (Medical): No    Lack of Transportation (Non-Medical): No  Physical Activity: Insufficiently Active (04/17/2021)   Exercise Vital Sign    Days of Exercise per Week: 4 days    Minutes of Exercise per Session: 30 min  Stress: No Stress Concern Present (04/17/2021)   [place]    Feeling of Stress : Not at all  Social Connections: Moderately Isolated (04/17/2021)   Social Connection and Isolation Panel [NHANES]    Frequency of Communication with Friends and Family: More than three times a week    Frequency of Social Gatherings with Friends and Family: More than three times a week    Attends Religious Services: 1 to  4 times per year    Active Member of [employer] or Organizations: No    Attends Archivist Meetings: Never    Marital Status: Widowed  Intimate Partner Violence: Not At Risk (04/17/2021)   Humiliation, Afraid, Rape, and Kick questionnaire    Fear of Current or Ex-Partner: No    Emotionally Abused: No    Physically Abused: No    Sexually Abused: No    Outpatient Medications Prior to Visit  Medication Sig Dispense Refill   allopurinol (ZYLOPRIM) 100 MG tablet Take 100 mg by mouth daily.  aspirin EC 81 MG tablet Take 81 mg by mouth daily.     baclofen (LIORESAL) 10 MG tablet Take 5 mg by mouth 3 (three) times daily as needed.     bumetanide (BUMEX) 2 MG tablet Take 1.5 tablets (3 mg total) by mouth 2 (two) times daily. 270 tablet 3   carvedilol (COREG) 12.5 MG tablet Take 1 tablet (12.5 mg total) by mouth 2 (two) times daily. 180 tablet 3   diclofenac sodium (VOLTAREN) 1 % GEL Apply 4 g topically 4 (four) times daily as needed (pain). 100 g 3   Ferrous Sulfate (IRON) 325 (65 FE) MG TABS Take 1 tablet by mouth daily.      gabapentin (NEURONTIN) 400 MG capsule Take 1 capsule (400 mg total) by mouth 3 (three) times daily. 90 capsule 0   Insulin Glargine (LANTUS SOLOSTAR) 100 UNIT/ML Solostar Pen Inject 5 Units into the skin daily at 10 pm. (Patient taking differently: Inject 25 Units into the skin at bedtime.) 15 mL 0   levothyroxine (SYNTHROID) 112 MCG tablet TAKE 1 TABLET BY MOUTH ONCE DAILY. 30 tablet 0   magnesium oxide (MAG-OX) 400 MG tablet Take 1 tablet (400 mg total) by mouth 2 (two) times daily. 60 tablet 11   meloxicam (MOBIC) 7.5 MG tablet Take 1 tablet (7.5 mg total) by mouth daily. 30 tablet 5   metFORMIN (GLUCOPHAGE) 1000 MG tablet TAKE ONE TABLET BY MOUTH TWICE DAILY WITH A MEAL. 180 tablet 3   metolazone (ZAROXOLYN) 2.5 MG tablet Two Times Weekly on Monday and Thursday 10 tablet 11   nitrofurantoin, macrocrystal-monohydrate, (MACROBID) 100 MG capsule Take 1 capsule (100  mg total) by mouth 2 (two) times daily. 10 capsule 0   nystatin cream (MYCOSTATIN) APPLY TO AFFECTED AREA TWICE DAILY. 30 g 0   pantoprazole (PROTONIX) 40 MG tablet Take 1 tablet (40 mg total) by mouth daily. 30 tablet 11   potassium chloride (K-DUR) 10 MEQ tablet Take 1 tablet (10 mEq total) by mouth 2 (two) times daily. 180 tablet 3   pravastatin (PRAVACHOL) 40 MG tablet TAKE ONE TABLET BY MOUTH DAILY. 90 tablet 0   sacubitril-valsartan (ENTRESTO) 49-51 MG Take 1 tablet by mouth 2 (two) times daily. 60 tablet 11   tamsulosin (FLOMAX) 0.4 MG CAPS capsule TAKE (1) CAPSULE BY MOUTH EVERY DAY. 90 capsule 3   tiZANidine (ZANAFLEX) 2 MG tablet Take 1 tablet (2 mg total) by mouth every 8 (eight) hours as needed for muscle spasms. 30 tablet 0   traZODone (DESYREL) 100 MG tablet TAKE 1 TABLET BY MOUTH AT BEDTIME AS NEEDED FOR SLEEP. 90 tablet 2   triamcinolone cream (KENALOG) 0.1 % Apply 1 application. topically 2 (two) times daily. 30 g 0   Vitamin D, Ergocalciferol, (DRISDOL) 1.25 MG (50000 UNIT) CAPS capsule Take 1 capsule (50,000 Units total) by mouth every 7 (seven) days. 12 capsule 1   XTAMPZA ER 9 MG C12A Take 1 capsule by mouth 2 (two) times daily.     HYDROcodone-acetaminophen (NORCO) 10-325 MG tablet TAKE (1) TABLET BY MOUTH (3) TIMES DAILY AS NEEDED 30 tablet 0   No facility-administered medications prior to visit.    No Known Allergies  ROS Review of Systems    Objective:    Physical Exam  BP 124/86 (BP Location: Right Arm, Patient Position: Sitting, Cuff Size: Normal)   Pulse 98   Resp 18   Ht _0  (1.499 m)   SpO2 96%   BMI 44.31 kg/m  Wt  Readings from Last 3 Encounters:  12/21/21 219 lb 6.4 oz (99.5 kg)  11/09/21 214 lb 3.2 oz (97.2 kg)  08/03/21 212 lb (96.2 kg)    Lab Results  Component Value Date   TSH 3.250 04/13/2021   Lab Results  Component Value Date   WBC 4.2 04/13/2021   HGB 11.5 (L) 04/13/2021   HCT 35.7 (L) 04/13/2021   MCV 82 04/13/2021   PLT  179 04/13/2021   Lab Results  Component Value Date   NA 137 12/21/2021   K 4.7 12/21/2021   CO2 24 12/21/2021   GLUCOSE 141 (H) 12/21/2021   BUN 10 12/21/2021   CREATININE 0.81 12/21/2021   BILITOT 0.4 12/21/2021   ALKPHOS 76 12/21/2021   AST 25 12/21/2021   ALT 26 12/21/2021   PROT 6.6 12/21/2021   ALBUMIN 4.3 12/21/2021   CALCIUM 9.7 12/21/2021   ANIONGAP 7 08/03/2021   EGFR 93 12/21/2021   Lab Results  Component Value Date   CHOL 143 04/13/2021   Lab Results  Component Value Date   HDL 49 04/13/2021   Lab Results  Component Value Date   LDLCALC 70 04/13/2021   Lab Results  Component Value Date   TRIG 137 04/13/2021   Lab Results  Component Value Date   CHOLHDL 3.5 04/10/2020   Lab Results  Component Value Date   HGBA1C 7.7 (H) 12/21/2021      Assessment & Plan:   Problem List Items Addressed This Visit       Cardiovascular and Mediastinum   Hypertension     Digestive   Gastroesophageal reflux disease     Endocrine   Diabetes mellitus ([place]) - Primary   Relevant Orders   Microalbumin / creatinine urine ratio     Other   Chronic pain syndrome   Relevant Medications   HYDROcodone-acetaminophen (NORCO) 10-325 MG tablet   Lymphedema of lower extremity    Meds ordered this encounter  Medications   HYDROcodone-acetaminophen (NORCO) 10-325 MG tablet    Sig: Take 1 tablet by mouth every 8 (eight) hours as needed for severe pain.    Dispense:  90 tablet    Refill:  0    Follow-up: No follow-ups on file.    Lindell Spar, MD

## 2022-03-20 NOTE — Patient Instructions (Signed)
Please use Clotrimazole-betamethasone cream for leg rash.  Please continue to take other medications as prescribed.  Please continue to follow low carb diet.  Please try to keep legs elevated and ambulate as tolerated.

## 2022-03-20 NOTE — Progress Notes (Unsigned)
llo

## 2022-03-22 ENCOUNTER — Ambulatory Visit: Payer: Medicare Other | Admitting: Internal Medicine

## 2022-03-22 LAB — MICROALBUMIN / CREATININE URINE RATIO
Creatinine, Urine: 35.2 mg/dL
Microalb/Creat Ratio: 659 mg/g creat — ABNORMAL HIGH (ref 0–29)
Microalbumin, Urine: 231.8 ug/mL

## 2022-03-23 LAB — TOXASSURE SELECT 13 (MW), URINE

## 2022-03-23 NOTE — Assessment & Plan Note (Signed)
On Pantoprazole 

## 2022-04-18 ENCOUNTER — Ambulatory Visit (INDEPENDENT_AMBULATORY_CARE_PROVIDER_SITE_OTHER): Payer: Medicare Other | Admitting: *Deleted

## 2022-04-18 DIAGNOSIS — Z0000 Encounter for general adult medical examination without abnormal findings: Secondary | ICD-10-CM

## 2022-04-18 NOTE — Progress Notes (Signed)
Subjective:   Michael [name] is a 76 y.o. male who presents for Medicare Annual/Subsequent preventive examination.  I connected with  Michael [name] on 04/18/22 by a audio enabled telemedicine application and verified that I am speaking with the correct person using two identifiers.  Patient Location: Home  Provider Location: Office/Clinic  I discussed the limitations of evaluation and management by telemedicine. The patient expressed understanding and agreed to proceed.   Review of Systems     Michael [name] , Thank you for taking time to come for your Medicare Wellness Visit. I appreciate your ongoing commitment to your health goals. Please review the following plan we discussed and let me know if I can assist you in the future.   These are the goals we discussed:  Goals      Increase physical activity     Is in physical therapy would like to use his walker to get around more         This is a list of the screening recommended for you and due dates:  Health Maintenance  Topic Date Due   COVID-19 Vaccine (5 - Booster for Moderna series) 05/15/2021   Flu Shot  04/30/2022   Hemoglobin A1C  06/23/2022   Eye exam for diabetics  01/30/2023   Complete foot exam   03/21/2023   Tetanus Vaccine  07/05/2024   Colon Cancer Screening  07/21/2024   Pneumonia Vaccine  Completed   Hepatitis C Screening: USPSTF Recommendation to screen - Ages 18-79 yo.  Completed   Zoster (Shingles) Vaccine  Completed   HPV Vaccine  Aged Out          Objective:    There were no vitals filed for this visit. There is no height or weight on file to calculate BMI.     04/17/2021    9:16 AM 09/20/2019    2:27 PM 08/03/2019    9:31 AM 07/22/2016    9:40 AM 07/08/2016    8:47 AM 07/03/2016   11:43 AM 05/09/2016    9:48 AM  Advanced Directives  Does Patient Have a Medical Advance Directive? Yes Yes Yes Yes Yes Yes Yes  Type of Paramedic of [place];Living will Healthcare  Power of Attorney Living will;Healthcare Power of Attorney Living will Living will Living will [place]  Does patient want to make changes to medical advance directive? Yes (MAU/Ambulatory/Procedural Areas - Information given) No - Guardian declined  No - Patient declined   No - Patient declined  Copy of [place] in Chart? No - copy requested   No - copy requested No - copy requested No - copy requested Yes    Current Medications (verified) Outpatient Encounter Medications as of 04/18/2022  Medication Sig   allopurinol (ZYLOPRIM) 100 MG tablet Take 100 mg by mouth daily.   aspirin EC 81 MG tablet Take 81 mg by mouth daily.   baclofen (LIORESAL) 10 MG tablet Take 5 mg by mouth 3 (three) times daily as needed.   bumetanide (BUMEX) 2 MG tablet Take 1.5 tablets (3 mg total) by mouth 2 (two) times daily.   carvedilol (COREG) 12.5 MG tablet Take 1 tablet (12.5 mg total) by mouth 2 (two) times daily.   clotrimazole-betamethasone (LOTRISONE) cream Apply 1 Application topically daily.   diclofenac sodium (VOLTAREN) 1 % GEL Apply 4 g topically 4 (four) times daily as needed (pain).   Ferrous Sulfate (IRON) 325 (65 FE) MG TABS Take  1 tablet by mouth daily.    gabapentin (NEURONTIN) 400 MG capsule Take 1 capsule (400 mg total) by mouth 3 (three) times daily.   HYDROcodone-acetaminophen (NORCO) 10-325 MG tablet Take 1 tablet by mouth every 8 (eight) hours as needed for severe pain.   Insulin Glargine (LANTUS SOLOSTAR) 100 UNIT/ML Solostar Pen Inject 5 Units into the skin daily at 10 pm. (Patient taking differently: Inject 25 Units into the skin at bedtime.)   levothyroxine (SYNTHROID) 112 MCG tablet TAKE 1 TABLET BY MOUTH ONCE DAILY.   magnesium oxide (MAG-OX) 400 MG tablet Take 1 tablet (400 mg total) by mouth 2 (two) times daily.   meloxicam (MOBIC) 7.5 MG tablet Take 1 tablet (7.5 mg total) by mouth daily.   metFORMIN (GLUCOPHAGE) 1000 MG tablet TAKE ONE TABLET BY  MOUTH TWICE DAILY WITH A MEAL.   metolazone (ZAROXOLYN) 2.5 MG tablet Two Times Weekly on Monday and Thursday   nitrofurantoin, macrocrystal-monohydrate, (MACROBID) 100 MG capsule Take 1 capsule (100 mg total) by mouth 2 (two) times daily.   nystatin cream (MYCOSTATIN) APPLY TO AFFECTED AREA TWICE DAILY.   pantoprazole (PROTONIX) 40 MG tablet Take 1 tablet (40 mg total) by mouth daily.   potassium chloride (K-DUR) 10 MEQ tablet Take 1 tablet (10 mEq total) by mouth 2 (two) times daily.   pravastatin (PRAVACHOL) 40 MG tablet TAKE ONE TABLET BY MOUTH DAILY.   sacubitril-valsartan (ENTRESTO) 49-51 MG Take 1 tablet by mouth 2 (two) times daily.   tamsulosin (FLOMAX) 0.4 MG CAPS capsule TAKE (1) CAPSULE BY MOUTH EVERY DAY.   tiZANidine (ZANAFLEX) 2 MG tablet Take 1 tablet (2 mg total) by mouth every 8 (eight) hours as needed for muscle spasms.   traZODone (DESYREL) 100 MG tablet TAKE 1 TABLET BY MOUTH AT BEDTIME AS NEEDED FOR SLEEP.   Vitamin D, Ergocalciferol, (DRISDOL) 1.25 MG (50000 UNIT) CAPS capsule Take 1 capsule (50,000 Units total) by mouth every 7 (seven) days.   XTAMPZA ER 9 MG C12A Take 1 capsule by mouth 2 (two) times daily.   No facility-administered encounter medications on file as of 04/18/2022.    Allergies (verified) Patient has no known allergies.   History: Past Medical History:  Diagnosis Date   Arthritis    Congestive heart disease ([place]) 11/2015   COPD (chronic obstructive pulmonary disease) (HCC)    DDD (degenerative disc disease), lumbosacral    Demand ischemia ([place]) 09/23/2019   Depression    Diabetic neuropathy (HCC)    Diastolic dysfunction    Essential hypertension    GERD (gastroesophageal reflux disease)    Hyperlipidemia    Iron deficiency anemia    Left knee DJD    Lumbar spinal stenosis    Lymphedema 11/2015   BOTH LEGS   NASH (nonalcoholic steatohepatitis)    Peripheral edema    Peripheral edema 10/05/2015   Type 2 diabetes mellitus ([place])    Past  Surgical History:  Procedure Laterality Date   BIOPSY N/A 07/21/2014   Procedure: BIOPSY;  Surgeon: Daneil Dolin, MD;  Location: [facility];  Service: Endoscopy;  Laterality: N/A;   CATARACT EXTRACTION W/PHACO Right 07/08/2016   Procedure: CATARACT EXTRACTION PHACO AND INTRAOCULAR LENS PLACEMENT RIGHT EYE;  Surgeon: Tonny Branch, MD;  Location: [facility];  Service: Ophthalmology;  Laterality: Right;  CDE: 9.01   CATARACT EXTRACTION W/PHACO Left 07/22/2016   Procedure: CATARACT EXTRACTION PHACO AND INTRAOCULAR LENS PLACEMENT LEFT EYE CDE=10.69;  Surgeon: Tonny Branch, MD;  Location: [facility];  Service: Ophthalmology;  Laterality: Left;  left -    COLONOSCOPY  2011   [facility]: normal colon, normal distal ileum   COLONOSCOPY WITH PROPOFOL N/A 07/21/2014   Procedure: ATTEMPTED COLONOSCOPY WITH PROPOFOL-HAD TO STOP DUE TO BRADYCARDIA;  Surgeon: Daneil Dolin, MD;  Location: [facility];  Service: Endoscopy;  Laterality: N/A;   EGD with enteroscopy  2011   [facility]: normal esophagus and stomach. Normal duodenum, jejunum. No evidence of AVMs.    ESOPHAGOGASTRODUODENOSCOPY (EGD) WITH PROPOFOL N/A 07/21/2014   Procedure: ESOPHAGOGASTRODUODENOSCOPY (EGD) WITH PROPOFOL;  Surgeon: Daneil Dolin, MD;  Location: [facility];  Service: Endoscopy;  Laterality: N/A;   HERNIA REPAIR     KNEE ARTHROSCOPY WITH MEDIAL MENISECTOMY Left 11/06/2012   Procedure: KNEE ARTHROSCOPY WITH MEDIAL MENISECTOMY;  Surgeon: Carole Civil, MD;  Location: [facility];  Service: Orthopedics;  Laterality: Left;   Lipoma removal     Stomach   LUMBAR LAMINECTOMY/DECOMPRESSION MICRODISCECTOMY Left 08/30/2013   Procedure: LUMBAR LAMINECTOMY/DECOMPRESSION MICRODISCECTOMY LEFT  LUMBAR TWO THREE;  Surgeon: Otilio Connors, MD;  Location: [place] [facility];  Service: Neurosurgery;  Laterality: Left;   RIGHT/LEFT HEART CATH AND CORONARY ANGIOGRAPHY N/A 09/22/2019   Procedure: RIGHT/LEFT HEART CATH AND CORONARY ANGIOGRAPHY;  Surgeon: Martinique, Peter M, MD;  Location: [facility];  Service: Cardiovascular;  Laterality: N/A;   SHOULDER SURGERY     Rght-rotator cuff   TOOTH EXTRACTION     Family History  Problem Relation Age of Onset   Heart disease Mother    Hyperlipidemia Mother    Hypertension Mother    Depression Mother    Diabetes Mother    Rectal cancer Mother    Cancer Mother    Heart disease Father    Hypertension Father    Hyperlipidemia Father    Diabetes Father    Cancer Father    Heart disease Sister    Hyperlipidemia Sister    Hypertension Sister    Diabetes Sister    Diabetes Brother    Heart disease Sister    Hyperlipidemia Sister    Hypertension Sister    Heart disease Sister    Hyperlipidemia Sister    Hypertension Sister    Diabetes Sister    Diabetes Brother    Social History   Socioeconomic History   Marital status: Widowed    Spouse name: Not on file   Number of children: Not on file   Years of education: 12   Highest education level: Not on file  Occupational History   Not on file  Tobacco Use   Smoking status: Former    Packs/day: 2.50    Years: 44.00    Total pack years: 110.00    Types: Cigarettes    Start date: 05/07/1955    Quit date: 10/01/1999    Years since quitting: 22.5   Smokeless tobacco: Never  Vaping Use   Vaping Use: Never used  Substance and Sexual Activity   Alcohol use: No    Alcohol/week: 0.0 standard drinks of alcohol   Drug use: No   Sexual activity: Yes    Birth control/protection: None  Other Topics Concern   Not on file  Social History Narrative   Not on file   Social Determinants of Health   Financial Resource Strain: Low Risk  (04/17/2021)   Overall Financial Resource Strain (CARDIA)    Difficulty of Paying Living Expenses: Not hard at all  Food Insecurity: No Food Insecurity (04/17/2021)   Hunger Vital Sign  Worried About Charity fundraiser in the Last Year: Never true    [place] in the Last Year: Never true  Transportation Needs: No Transportation  Needs (04/17/2021)   PRAPARE - Hydrologist (Medical): No    Lack of Transportation (Non-Medical): No  Physical Activity: Insufficiently Active (04/17/2021)   Exercise Vital Sign    Days of Exercise per Week: 4 days    Minutes of Exercise per Session: 30 min  Stress: No Stress Concern Present (04/17/2021)   [place]    Feeling of Stress : Not at all  Social Connections: Moderately Isolated (04/17/2021)   Social Connection and Isolation Panel [NHANES]    Frequency of Communication with Friends and Family: More than three times a week    Frequency of Social Gatherings with Friends and Family: More than three times a week    Attends Religious Services: 1 to 4 times per year    Active Member of [employer] or Organizations: No    Attends Archivist Meetings: Never    Marital Status: Widowed    Tobacco Counseling Counseling given: Not Answered   Clinical Intake:                 Diabetic?Nutrition Risk Assessment:  Has the patient had any N/V/D within the last 2 months?  No  Does the patient have any non-healing wounds?  No  Has the patient had any unintentional weight loss or weight gain?  No   Diabetes:  Is the patient diabetic?  Yes  If diabetic, was a CBG obtained today?  No  Did the patient bring in their glucometer from home?  No  How often do you monitor your CBG's? Doesn't check this often.   Financial Strains and Diabetes Management:  Are you having any financial strains with the device, your supplies or your medication? No .  Does the patient want to be seen by Chronic Care Management for management of their diabetes?  No  Would the patient like to be referred to a Nutritionist or for Diabetic Management?  No   Diabetic Exams:  Diabetic Eye Exam: Completed 01-29-22.  Diabetic Foot Exam: Completed 03-20-22.           Activities of Daily Living     No  data to display           Patient Care Team: Lindell Spar, MD as PCP - General (Internal Medicine) Harl Bowie Alphonse Guild, MD as PCP - Cardiology (Cardiology) Carole Civil, MD as Referring Physician (Orthopedic Surgery) Edythe Clarity, [facility] as Pharmacist (Pharmacist)  Indicate any recent Medical Services you may have received from other than [facility] providers in the past year (date may be approximate).     Assessment:   This is a routine wellness examination for [place].  Hearing/Vision screen No results found.  Dietary issues and exercise activities discussed:     Goals Addressed   None   Depression Screen    03/20/2022    8:40 AM 12/21/2021   10:06 AM 07/10/2021    9:40 AM 05/23/2021    8:15 AM 04/17/2021    9:17 AM 04/17/2021    9:14 AM 04/13/2021    9:16 AM  PHQ 2/9 Scores  PHQ - 2 Score 0 0 0 1 0 0 0    Fall Risk    03/20/2022    8:39 AM 12/21/2021   10:06  AM 07/10/2021    9:40 AM 05/23/2021    8:15 AM 04/17/2021    9:17 AM  [place] in the past year? 0 0 0 0 0  Number falls in past yr: 0 0 0 0 0  Injury with Fall? 0 0 0 0 0  Risk for fall due to : No Fall Risks No Fall Risks No Fall Risks No Fall Risks No Fall Risks  Follow up Falls evaluation completed Falls evaluation completed Falls evaluation completed Falls evaluation completed Falls evaluation completed    [place]:  Any stairs in or around the home? No  If so, are there any without handrails? No  Home free of loose throw rugs in walkways, pet beds, electrical cords, etc? Yes  Adequate lighting in your home to reduce risk of falls? Yes   ASSISTIVE DEVICES UTILIZED TO PREVENT FALLS:  Life alert? Yes  Use of a cane, walker or w/c? Yes  Grab bars in the bathroom? Yes  Shower chair or bench in shower? Yes  Elevated toilet seat or a handicapped toilet? Yes    Cognitive Function:    04/17/2021    9:18 AM  MMSE - Mini Mental State Exam  Not  completed: Unable to complete        04/17/2021    9:18 AM  6CIT Screen  What Year? 0 points  What month? 0 points  What time? 0 points  Count back from 20 0 points  Months in reverse 0 points  Repeat phrase 0 points  Total Score 0 points    Immunizations Immunization History  Administered Date(s) Administered   Fluad Quad(high Dose 65+) 08/03/2019, 07/04/2020, 07/10/2021   Hep A / Hep B 07/13/2014   Influenza Split 06/11/2012   Influenza, High Dose Seasonal PF 06/20/2017, 07/08/2017, 06/26/2018   Influenza,inj,Quad PF,6+ Mos 07/14/2013, 06/15/2014, 06/09/2015, 07/30/2016   Influenza-Unspecified 07/07/2008, 09/27/2009, 07/14/2010, 06/25/2011, 06/15/2014, 06/09/2015, 07/30/2016, 07/01/2019, 06/30/2020   Moderna Sars-Covid-2 Vaccination 11/17/2019, 12/15/2019, 08/18/2020   Pneumococcal Conjugate-13 04/12/2014   Pneumococcal Polysaccharide-23 06/11/2012, 04/12/2014   Pneumococcal-Unspecified 07/07/2008   Tdap 07/05/2014   Zoster Recombinat (Shingrix) 10/21/2017, 10/21/2017, 08/03/2019    TDAP status: Up to date  Flu Vaccine status: Up to date  Pneumococcal vaccine status: Up to date  Covid-19 vaccine status: Completed vaccines  Qualifies for Shingles Vaccine? Yes   Zostavax completed Yes   Shingrix Completed?: Yes  Screening Tests Health Maintenance  Topic Date Due   COVID-19 Vaccine (5 - Booster for Moderna series) 05/15/2021   INFLUENZA VACCINE  04/30/2022   HEMOGLOBIN A1C  06/23/2022   OPHTHALMOLOGY EXAM  01/30/2023   FOOT EXAM  03/21/2023   TETANUS/TDAP  07/05/2024   COLONOSCOPY (Pts 45-28yr Insurance coverage will need to be confirmed)  07/21/2024   Pneumonia Vaccine 75 Years old  Completed   Hepatitis C Screening  Completed   Zoster Vaccines- Shingrix  Completed   HPV VACCINES  Aged Out    Health Maintenance  Health Maintenance Due  Topic Date Due   COVID-19 Vaccine (5 - Booster for Moderna series) 05/15/2021    Colorectal cancer screening: Type  of screening: Colonoscopy. Completed 07-21-14. Repeat every 10 years  Lung Cancer Screening: (Low Dose CT Chest recommended if Age 75-80years, 30 pack-year currently smoking OR have quit w/in 15years.) does not qualify.   Lung Cancer Screening Referral: NA  Additional Screening:  Hepatitis C Screening: does qualify; Completed 06-29-14  Vision Screening: Recommended annual  ophthalmology exams for early detection of glaucoma and other disorders of the eye. Is the patient up to date with their annual eye exam?  Yes  Who is the provider or what is the name of the office in which the patient attends annual eye exams? [facility]  If pt is not established with a provider, would they like to be referred to a provider to establish care? No .   Dental Screening: Recommended annual dental exams for proper oral hygiene  Community Resource Referral / Chronic Care Management: CRR required this visit?  No   CCM required this visit?  No      Plan:     I have personally reviewed and noted the following in the patient's chart:   Medical and social history Use of alcohol, tobacco or illicit drugs  Current medications and supplements including opioid prescriptions. Patient is not currently taking opioid prescriptions. Functional ability and status Nutritional status Physical activity Advanced directives List of other physicians Hospitalizations, surgeries, and ER visits in previous 12 months Vitals Screenings to include cognitive, depression, and falls Referrals and appointments  In addition, I have reviewed and discussed with patient certain preventive protocols, quality metrics, and best practice recommendations. A written personalized care plan for preventive services as well as general preventive health recommendations were provided to patient.     Shelda Altes, CMA   04/18/2022   Nurse Notes:  Mr. Wexler , Thank you for taking time to come for your Medicare Wellness Visit. I  appreciate your ongoing commitment to your health goals. Please review the following plan we discussed and let me know if I can assist you in the future.   These are the goals we discussed:  Goals      Increase physical activity     Is in physical therapy would like to use his walker to get around more         This is a list of the screening recommended for you and due dates:  Health Maintenance  Topic Date Due   COVID-19 Vaccine (5 - Booster for Moderna series) 05/15/2021   Flu Shot  04/30/2022   Hemoglobin A1C  06/23/2022   Eye exam for diabetics  01/30/2023   Complete foot exam   03/21/2023   Tetanus Vaccine  07/05/2024   Colon Cancer Screening  07/21/2024   Pneumonia Vaccine  Completed   Hepatitis C Screening: USPSTF Recommendation to screen - Ages 18-79 yo.  Completed   Zoster (Shingles) Vaccine  Completed   HPV Vaccine  Aged Out

## 2022-04-18 NOTE — Patient Instructions (Signed)
Michael [name] , Thank you for taking time to come for your Medicare Wellness Visit. I appreciate your ongoing commitment to your health goals. Please review the following plan we discussed and let me know if I can assist you in the future.   Screening recommendations/referrals: Colonoscopy: due 07-21-24 Recommended yearly ophthalmology/optometry visit for glaucoma screening and checkup Recommended yearly dental visit for hygiene and checkup  Vaccinations: Influenza vaccine: completed Pneumococcal vaccine: completed Tdap vaccine: completed Shingles vaccine: completed    Advanced directives: copy requested   Conditions/risks identified: Falls, diabetes   Next appointment: 1 year   Preventive Care 31 Years and Older, Male Preventive care refers to lifestyle choices and visits with your health care provider that can promote health and wellness. What does preventive care include? A yearly physical exam. This is also called an annual well check. Dental exams once or twice a year. Routine eye exams. Ask your health care provider how often you should have your eyes checked. Personal lifestyle choices, including: Daily care of your teeth and gums. Regular physical activity. Eating a healthy diet. Avoiding tobacco and drug use. Limiting alcohol use. Practicing safe sex. Taking low doses of aspirin every day. Taking vitamin and mineral supplements as recommended by your health care provider. What happens during an annual well check? The services and screenings done by your health care provider during your annual well check will depend on your age, overall health, lifestyle risk factors, and family history of disease. Counseling  Your health care provider may ask you questions about your: Alcohol use. Tobacco use. Drug use. Emotional well-being. Home and relationship well-being. Sexual activity. Eating habits. History of falls. Memory and ability to understand (cognition). Work and  work Statistician. Screening  You may have the following tests or measurements: Height, weight, and BMI. Blood pressure. Lipid and cholesterol levels. These may be checked every 5 years, or more frequently if you are over 16 years old. Skin check. Lung cancer screening. You may have this screening every year starting at age 70 if you have a 30-pack-year history of smoking and currently smoke or have quit within the past 15 years. Fecal occult blood test (FOBT) of the stool. You may have this test every year starting at age 29. Flexible sigmoidoscopy or colonoscopy. You may have a sigmoidoscopy every 5 years or a colonoscopy every 10 years starting at age 34. Prostate cancer screening. Recommendations will vary depending on your family history and other risks. Hepatitis C blood test. Hepatitis B blood test. Sexually transmitted disease (STD) testing. Diabetes screening. This is done by checking your blood sugar (glucose) after you have not eaten for a while (fasting). You may have this done every 1-3 years. Abdominal aortic aneurysm (AAA) screening. You may need this if you are a current or former smoker. Osteoporosis. You may be screened starting at age 61 if you are at high risk. Talk with your health care provider about your test results, treatment options, and if necessary, the need for more tests. Vaccines  Your health care provider may recommend certain vaccines, such as: Influenza vaccine. This is recommended every year. Tetanus, diphtheria, and acellular pertussis (Tdap, Td) vaccine. You may need a Td booster every 10 years. Zoster vaccine. You may need this after age 4. Pneumococcal 13-valent conjugate (PCV13) vaccine. One dose is recommended after age 29. Pneumococcal polysaccharide (PPSV23) vaccine. One dose is recommended after age 81. Talk to your health care provider about which screenings and vaccines you need and how often you  need them. This information is not intended to  replace advice given to you by your health care provider. Make sure you discuss any questions you have with your health care provider. Document Released: 10/13/2015 Document Revised: 06/05/2016 Document Reviewed: 07/18/2015 Elsevier Interactive Patient Education  2017 [place] Prevention in the Home Falls can cause injuries. They can happen to people of all ages. There are many things you can do to make your home safe and to help prevent falls. What can I do on the outside of my home? Regularly fix the edges of walkways and driveways and fix any cracks. Remove anything that might make you trip as you walk through a door, such as a raised step or threshold. Trim any bushes or trees on the path to your home. Use bright outdoor lighting. Clear any walking paths of anything that might make someone trip, such as rocks or tools. Regularly check to see if handrails are loose or broken. Make sure that both sides of any steps have handrails. Any raised decks and porches should have guardrails on the edges. Have any leaves, snow, or ice cleared regularly. Use sand or salt on walking paths during winter. Clean up any spills in your garage right away. This includes oil or grease spills. What can I do in the bathroom? Use night lights. Install grab bars by the toilet and in the tub and shower. Do not use towel bars as grab bars. Use non-skid mats or decals in the tub or shower. If you need to sit down in the shower, use a plastic, non-slip stool. Keep the floor dry. Clean up any water that spills on the floor as soon as it happens. Remove soap buildup in the tub or shower regularly. Attach bath mats securely with double-sided non-slip rug tape. Do not have throw rugs and other things on the floor that can make you trip. What can I do in the bedroom? Use night lights. Make sure that you have a light by your bed that is easy to reach. Do not use any sheets or blankets that are too big for  your bed. They should not hang down onto the floor. Have a firm chair that has side arms. You can use this for support while you get dressed. Do not have throw rugs and other things on the floor that can make you trip. What can I do in the kitchen? Clean up any spills right away. Avoid walking on wet floors. Keep items that you use a lot in easy-to-reach places. If you need to reach something above you, use a strong step stool that has a grab bar. Keep electrical cords out of the way. Do not use floor polish or wax that makes floors slippery. If you must use wax, use non-skid floor wax. Do not have throw rugs and other things on the floor that can make you trip. What can I do with my stairs? Do not leave any items on the stairs. Make sure that there are handrails on both sides of the stairs and use them. Fix handrails that are broken or loose. Make sure that handrails are as long as the stairways. Check any carpeting to make sure that it is firmly attached to the stairs. Fix any carpet that is loose or worn. Avoid having throw rugs at the top or bottom of the stairs. If you do have throw rugs, attach them to the floor with carpet tape. Make sure that you have a light switch  at the top of the stairs and the bottom of the stairs. If you do not have them, ask someone to add them for you. What else can I do to help prevent falls? Wear shoes that: Do not have high heels. Have rubber bottoms. Are comfortable and fit you well. Are closed at the toe. Do not wear sandals. If you use a stepladder: Make sure that it is fully opened. Do not climb a closed stepladder. Make sure that both sides of the stepladder are locked into place. Ask someone to hold it for you, if possible. Clearly mark and make sure that you can see: Any grab bars or handrails. First and last steps. Where the edge of each step is. Use tools that help you move around (mobility aids) if they are needed. These  include: Canes. Walkers. Scooters. Crutches. Turn on the lights when you go into a dark area. Replace any light bulbs as soon as they burn out. Set up your furniture so you have a clear path. Avoid moving your furniture around. If any of your floors are uneven, fix them. If there are any pets around you, be aware of where they are. Review your medicines with your doctor. Some medicines can make you feel dizzy. This can increase your chance of falling. Ask your doctor what other things that you can do to help prevent falls. This information is not intended to replace advice given to you by your health care provider. Make sure you discuss any questions you have with your health care provider. Document Released: 07/13/2009 Document Revised: 02/22/2016 Document Reviewed: 10/21/2014 Elsevier Interactive Patient Education  2017 [employer].

## 2022-04-22 ENCOUNTER — Other Ambulatory Visit: Payer: Self-pay | Admitting: Internal Medicine

## 2022-04-22 DIAGNOSIS — G894 Chronic pain syndrome: Secondary | ICD-10-CM

## 2022-04-25 ENCOUNTER — Ambulatory Visit (INDEPENDENT_AMBULATORY_CARE_PROVIDER_SITE_OTHER): Payer: Medicare Other | Admitting: Internal Medicine

## 2022-04-25 ENCOUNTER — Encounter: Payer: Self-pay | Admitting: Internal Medicine

## 2022-04-25 VITALS — BP 138/83 | HR 86 | Ht 59.0 in | Wt 211.0 lb

## 2022-04-25 DIAGNOSIS — R300 Dysuria: Secondary | ICD-10-CM | POA: Diagnosis not present

## 2022-04-25 DIAGNOSIS — N3000 Acute cystitis without hematuria: Secondary | ICD-10-CM

## 2022-04-25 DIAGNOSIS — R350 Frequency of micturition: Secondary | ICD-10-CM | POA: Diagnosis not present

## 2022-04-25 DIAGNOSIS — N401 Enlarged prostate with lower urinary tract symptoms: Secondary | ICD-10-CM | POA: Diagnosis not present

## 2022-04-25 LAB — POCT URINALYSIS DIP (CLINITEK)
Bilirubin, UA: NEGATIVE
Blood, UA: NEGATIVE
Glucose, UA: NEGATIVE mg/dL
Ketones, POC UA: NEGATIVE mg/dL
Nitrite, UA: NEGATIVE
POC PROTEIN,UA: 30 — AB
Spec Grav, UA: 1.01 (ref 1.010–1.025)
Urobilinogen, UA: 0.2 E.U./dL
pH, UA: 7 (ref 5.0–8.0)

## 2022-04-25 MED ORDER — NITROFURANTOIN MONOHYD MACRO 100 MG PO CAPS: 100.0000 mg | ORAL_CAPSULE | Freq: Two times a day (BID) | ORAL | 0 refills | Status: DC

## 2022-04-25 NOTE — Assessment & Plan Note (Signed)
Has chronic nocturia and urinary frequency Started Tamsulosin -now better Does not want to see Urology despite elevated PSA

## 2022-04-25 NOTE — Assessment & Plan Note (Signed)
Has had UTIs in the past UA reviewed, check urine culture Started Macrobid Had referred to Urology for elevated PSA and recurrent UTI, does not want to see Urology.

## 2022-04-25 NOTE — Progress Notes (Signed)
Acute Office Visit  Subjective:    Patient ID: Michael [name], male    DOB: May 15, 1946, 76 y.o.   MRN: [id number]  Chief Complaint  Patient presents with   Urinary Tract Infection    Symptoms been there for about a week    HPI Patient is in today for c/o dysuria and worsening of fatigue for the last 1 week.  He denies any hematuria, fever, chills, nausea or vomiting currently. He reports improvement in his nocturia and urinary stream with Flomax now.  Past Medical History:  Diagnosis Date   Arthritis    Congestive heart disease ([place]) 11/2015   COPD (chronic obstructive pulmonary disease) (HCC)    DDD (degenerative disc disease), lumbosacral    Demand ischemia ([place]) 09/23/2019   Depression    Diabetic neuropathy (HCC)    Diastolic dysfunction    Essential hypertension    GERD (gastroesophageal reflux disease)    Hyperlipidemia    Iron deficiency anemia    Left knee DJD    Lumbar spinal stenosis    Lymphedema 11/2015   BOTH LEGS   NASH (nonalcoholic steatohepatitis)    Peripheral edema    Peripheral edema 10/05/2015   Type 2 diabetes mellitus ([place])     Past Surgical History:  Procedure Laterality Date   BIOPSY N/A 07/21/2014   Procedure: BIOPSY;  Surgeon: Daneil Dolin, MD;  Location: [facility];  Service: Endoscopy;  Laterality: N/A;   CATARACT EXTRACTION W/PHACO Right 07/08/2016   Procedure: CATARACT EXTRACTION PHACO AND INTRAOCULAR LENS PLACEMENT RIGHT EYE;  Surgeon: Tonny Branch, MD;  Location: [facility];  Service: Ophthalmology;  Laterality: Right;  CDE: 9.01   CATARACT EXTRACTION W/PHACO Left 07/22/2016   Procedure: CATARACT EXTRACTION PHACO AND INTRAOCULAR LENS PLACEMENT LEFT EYE CDE=10.69;  Surgeon: Tonny Branch, MD;  Location: [facility];  Service: Ophthalmology;  Laterality: Left;  left -    COLONOSCOPY  2011   [facility]: normal colon, normal distal ileum   COLONOSCOPY WITH PROPOFOL N/A 07/21/2014   Procedure: ATTEMPTED COLONOSCOPY WITH PROPOFOL-HAD TO STOP DUE TO  BRADYCARDIA;  Surgeon: Daneil Dolin, MD;  Location: [facility];  Service: Endoscopy;  Laterality: N/A;   EGD with enteroscopy  2011   [facility]: normal esophagus and stomach. Normal duodenum, jejunum. No evidence of AVMs.    ESOPHAGOGASTRODUODENOSCOPY (EGD) WITH PROPOFOL N/A 07/21/2014   Procedure: ESOPHAGOGASTRODUODENOSCOPY (EGD) WITH PROPOFOL;  Surgeon: Daneil Dolin, MD;  Location: [facility];  Service: Endoscopy;  Laterality: N/A;   HERNIA REPAIR     KNEE ARTHROSCOPY WITH MEDIAL MENISECTOMY Left 11/06/2012   Procedure: KNEE ARTHROSCOPY WITH MEDIAL MENISECTOMY;  Surgeon: Carole Civil, MD;  Location: [facility];  Service: Orthopedics;  Laterality: Left;   Lipoma removal     Stomach   LUMBAR LAMINECTOMY/DECOMPRESSION MICRODISCECTOMY Left 08/30/2013   Procedure: LUMBAR LAMINECTOMY/DECOMPRESSION MICRODISCECTOMY LEFT  LUMBAR TWO THREE;  Surgeon: Otilio Connors, MD;  Location: [place] [facility];  Service: Neurosurgery;  Laterality: Left;   RIGHT/LEFT HEART CATH AND CORONARY ANGIOGRAPHY N/A 09/22/2019   Procedure: RIGHT/LEFT HEART CATH AND CORONARY ANGIOGRAPHY;  Surgeon: Martinique, Peter M, MD;  Location: [place] [facility];  Service: Cardiovascular;  Laterality: N/A;   SHOULDER SURGERY     Rght-rotator cuff   TOOTH EXTRACTION      Family History  Problem Relation Age of Onset   Heart disease Mother    Hyperlipidemia Mother    Hypertension Mother    Depression Mother    Diabetes Mother  Rectal cancer Mother    Cancer Mother    Heart disease Father    Hypertension Father    Hyperlipidemia Father    Diabetes Father    Cancer Father    Heart disease Sister    Hyperlipidemia Sister    Hypertension Sister    Diabetes Sister    Diabetes Brother    Heart disease Sister    Hyperlipidemia Sister    Hypertension Sister    Heart disease Sister    Hyperlipidemia Sister    Hypertension Sister    Diabetes Sister    Diabetes Brother     Social History   Socioeconomic History   Marital status:  Widowed    Spouse name: Not on file   Number of children: Not on file   Years of education: 12   Highest education level: Not on file  Occupational History   Not on file  Tobacco Use   Smoking status: Former    Packs/day: 2.50    Years: 44.00    Total pack years: 110.00    Types: Cigarettes    Start date: 05/07/1955    Quit date: 10/01/1999    Years since quitting: 22.5   Smokeless tobacco: Never  Vaping Use   Vaping Use: Never used  Substance and Sexual Activity   Alcohol use: No    Alcohol/week: 0.0 standard drinks of alcohol   Drug use: No   Sexual activity: Yes    Birth control/protection: None  Other Topics Concern   Not on file  Social History Narrative   Not on file   Social Determinants of Health   Financial Resource Strain: Low Risk  (04/18/2022)   Overall Financial Resource Strain (CARDIA)    Difficulty of Paying Living Expenses: Not hard at all  Food Insecurity: No Food Insecurity (04/18/2022)   Hunger Vital Sign    Worried About Running Out of Food in the Last Year: Never true    Ran Out of Food in the Last Year: Never true  Transportation Needs: No Transportation Needs (04/18/2022)   PRAPARE - Hydrologist (Medical): No    Lack of Transportation (Non-Medical): No  Physical Activity: Inactive (04/18/2022)   Exercise Vital Sign    Days of Exercise per Week: 0 days    Minutes of Exercise per Session: 0 min  Stress: No Stress Concern Present (04/18/2022)   [place]    Feeling of Stress : Not at all  Social Connections: Moderately Isolated (04/18/2022)   Social Connection and Isolation Panel [NHANES]    Frequency of Communication with Friends and Family: More than three times a week    Frequency of Social Gatherings with Friends and Family: More than three times a week    Attends Religious Services: 1 to 4 times per year    Active Member of [employer] or Organizations: No     Attends Archivist Meetings: Never    Marital Status: Widowed  Intimate Partner Violence: Not At Risk (04/18/2022)   Humiliation, Afraid, Rape, and Kick questionnaire    Fear of Current or Ex-Partner: No    Emotionally Abused: No    Physically Abused: No    Sexually Abused: No    Outpatient Medications Prior to Visit  Medication Sig Dispense Refill   allopurinol (ZYLOPRIM) 100 MG tablet Take 100 mg by mouth daily.     aspirin EC 81 MG tablet Take 81 mg by mouth  daily.     baclofen (LIORESAL) 10 MG tablet Take 5 mg by mouth 3 (three) times daily as needed.     bumetanide (BUMEX) 2 MG tablet Take 1.5 tablets (3 mg total) by mouth 2 (two) times daily. 270 tablet 3   carvedilol (COREG) 12.5 MG tablet Take 1 tablet (12.5 mg total) by mouth 2 (two) times daily. 180 tablet 3   clotrimazole-betamethasone (LOTRISONE) cream Apply 1 Application topically daily. 30 g 0   diclofenac sodium (VOLTAREN) 1 % GEL Apply 4 g topically 4 (four) times daily as needed (pain). 100 g 3   Ferrous Sulfate (IRON) 325 (65 FE) MG TABS Take 1 tablet by mouth daily.      gabapentin (NEURONTIN) 400 MG capsule Take 1 capsule (400 mg total) by mouth 3 (three) times daily. 90 capsule 0   HYDROcodone-acetaminophen (NORCO) 10-325 MG tablet TAKE (1) TABLET BY MOUTH (3) TIMES DAILY AS NEEDED 90 tablet 0   Insulin Glargine (LANTUS SOLOSTAR) 100 UNIT/ML Solostar Pen Inject 5 Units into the skin daily at 10 pm. (Patient taking differently: Inject 25 Units into the skin at bedtime.) 15 mL 0   levothyroxine (SYNTHROID) 112 MCG tablet TAKE 1 TABLET BY MOUTH ONCE DAILY. 30 tablet 0   magnesium oxide (MAG-OX) 400 MG tablet Take 1 tablet (400 mg total) by mouth 2 (two) times daily. 60 tablet 11   meloxicam (MOBIC) 7.5 MG tablet Take 1 tablet (7.5 mg total) by mouth daily. 30 tablet 5   metFORMIN (GLUCOPHAGE) 1000 MG tablet TAKE ONE TABLET BY MOUTH TWICE DAILY WITH A MEAL. 180 tablet 3   metolazone (ZAROXOLYN) 2.5 MG tablet Two  Times Weekly on Monday and Thursday 10 tablet 11   nystatin cream (MYCOSTATIN) APPLY TO AFFECTED AREA TWICE DAILY. 30 g 0   pantoprazole (PROTONIX) 40 MG tablet Take 1 tablet (40 mg total) by mouth daily. 30 tablet 11   potassium chloride (K-DUR) 10 MEQ tablet Take 1 tablet (10 mEq total) by mouth 2 (two) times daily. 180 tablet 3   pravastatin (PRAVACHOL) 40 MG tablet TAKE ONE TABLET BY MOUTH DAILY. 90 tablet 0   sacubitril-valsartan (ENTRESTO) 49-51 MG Take 1 tablet by mouth 2 (two) times daily. 60 tablet 11   tamsulosin (FLOMAX) 0.4 MG CAPS capsule TAKE (1) CAPSULE BY MOUTH EVERY DAY. 90 capsule 3   tiZANidine (ZANAFLEX) 2 MG tablet Take 1 tablet (2 mg total) by mouth every 8 (eight) hours as needed for muscle spasms. 30 tablet 0   traZODone (DESYREL) 100 MG tablet TAKE 1 TABLET BY MOUTH AT BEDTIME AS NEEDED FOR SLEEP. 90 tablet 2   Vitamin D, Ergocalciferol, (DRISDOL) 1.25 MG (50000 UNIT) CAPS capsule Take 1 capsule (50,000 Units total) by mouth every 7 (seven) days. 12 capsule 1   XTAMPZA ER 9 MG C12A Take 1 capsule by mouth 2 (two) times daily.     nitrofurantoin, macrocrystal-monohydrate, (MACROBID) 100 MG capsule Take 1 capsule (100 mg total) by mouth 2 (two) times daily. 10 capsule 0   No facility-administered medications prior to visit.    No Known Allergies  Review of Systems  Constitutional:  Positive for fatigue. Negative for chills and fever.  HENT:  Negative for congestion and sore throat.   Eyes:  Negative for pain and discharge.  Respiratory:  Negative for cough and shortness of breath.   Cardiovascular:  Positive for leg swelling. Negative for chest pain and palpitations.  Gastrointestinal:  Negative for diarrhea, nausea and vomiting.  Endocrine:  Negative for polydipsia and polyuria.  Genitourinary:  Positive for dysuria. Negative for hematuria.  Musculoskeletal:  Positive for arthralgias, back pain and neck pain. Negative for neck stiffness.  Skin:  Positive for color  change and rash.  Neurological:  Positive for weakness and numbness. Negative for dizziness and headaches.  Psychiatric/Behavioral:  Negative for agitation and behavioral problems.        Objective:    Physical Exam Vitals reviewed.  Constitutional:      General: He is not in acute distress.    Appearance: He is obese. He is not diaphoretic.     Comments: In wheelchair  HENT:     Head: Normocephalic and atraumatic.     Nose: Nose normal.     Mouth/Throat:     Mouth: Mucous membranes are moist.  Eyes:     General: No scleral icterus.    Extraocular Movements: Extraocular movements intact.  Cardiovascular:     Rate and Rhythm: Normal rate and regular rhythm.     Pulses: Normal pulses.     Heart sounds: Normal heart sounds. No murmur heard. Pulmonary:     Breath sounds: Normal breath sounds. No wheezing or rales.  Abdominal:     Palpations: Abdomen is soft.     Tenderness: There is no abdominal tenderness.  Musculoskeletal:     Cervical back: Neck supple. No tenderness.     Right lower leg: Edema (3+) present.     Left lower leg: Edema (3+) present.  Skin:    General: Skin is warm.     Findings: Erythema (Over b/l LE) and rash (Maculopapular eruptions over b/l feet) present.  Neurological:     General: No focal deficit present.     Mental Status: He is alert and oriented to person, place, and time.  Psychiatric:        Mood and Affect: Mood normal.        Behavior: Behavior normal.     BP 138/83   Pulse 86   Ht 4' 11"  (1.499 m)   Wt 211 lb (95.7 kg)   SpO2 99%   BMI 42.62 kg/m  Wt Readings from Last 3 Encounters:  04/25/22 211 lb (95.7 kg)  12/21/21 219 lb 6.4 oz (99.5 kg)  11/09/21 214 lb 3.2 oz (97.2 kg)        Assessment & Plan:   Problem List Items Addressed This Visit       Genitourinary   Acute cystitis without hematuria - Primary    Has had UTIs in the past UA reviewed, check urine culture Started Macrobid Had referred to Urology for  elevated PSA and recurrent UTI, does not want to see Urology.      Relevant Medications   nitrofurantoin, macrocrystal-monohydrate, (MACROBID) 100 MG capsule     Other   Benign prostatic hyperplasia with urinary frequency    Has chronic nocturia and urinary frequency Started Tamsulosin -now better Does not want to see Urology despite elevated PSA      Other Visit Diagnoses     Dysuria       Relevant Orders   POCT URINALYSIS DIP (CLINITEK) (Completed)   Urine Culture        Meds ordered this encounter  Medications   nitrofurantoin, macrocrystal-monohydrate, (MACROBID) 100 MG capsule    Sig: Take 1 capsule (100 mg total) by mouth 2 (two) times daily.    Dispense:  10 capsule    Refill:  0     Tykesha Konicki Keith Rake,  MD

## 2022-04-29 LAB — URINE CULTURE

## 2022-05-20 ENCOUNTER — Other Ambulatory Visit: Payer: Self-pay | Admitting: Internal Medicine

## 2022-05-20 DIAGNOSIS — G894 Chronic pain syndrome: Secondary | ICD-10-CM

## 2022-05-22 ENCOUNTER — Encounter: Payer: Self-pay | Admitting: Cardiology

## 2022-05-22 ENCOUNTER — Ambulatory Visit (INDEPENDENT_AMBULATORY_CARE_PROVIDER_SITE_OTHER): Payer: Medicare Other | Admitting: Cardiology

## 2022-05-22 VITALS — BP 138/80 | HR 71 | Ht 59.0 in | Wt 206.0 lb

## 2022-05-22 DIAGNOSIS — I10 Essential (primary) hypertension: Secondary | ICD-10-CM | POA: Diagnosis not present

## 2022-05-22 DIAGNOSIS — I5022 Chronic systolic (congestive) heart failure: Secondary | ICD-10-CM

## 2022-05-22 NOTE — Progress Notes (Signed)
Clinical Summary Michael [name] is a 76 y.o.male seen today for follow up of the following medical problems.      1. Chronic combined systolic/ diastolic heart failure - several year history of diastolic HF. New diagnosis of systolic HF during 19/6222 admission 08/2019 echo: <20%, akinesis distal 1/3 08/2019 cath mild CAD, normal filling pressures       -last seen in clnic 01/2020 - no recent SOB/DOE - LE edema improving. He is on bumex 29m bid, metolazone 2.549mMon and Thurs     Jan 2023 echo: LVEF 50-55%. Grade II dd - chronic leg edema is up and down.  - he is on bumex 72m20mid and metolazone 2.5mg872mn and Thurs.  -11/2021 labs K 4.7 Cr 0.81  -swelling overall stable.  - compliant with diuretic -some recent additinoal fluid loss with diarrhea, followed by pcp.   - has not taken meds yet today     2. Lymphedema - difficultly getting transporation, did not complete sessions   3. HTN - has not taken meds yet today     SH: veteran, he is a [place]. Served in the [employer] is Michael Mylarrts also a patient of mine, recently passed away around labor day 202003/10/20m pancreatic cancer Past Medical History:  Diagnosis Date   Arthritis    Congestive heart disease (HCC)[place]/2017   COPD (chronic obstructive pulmonary disease) (HCC)[place] DDD (degenerative disc disease), lumbosacral    Demand ischemia (HCC)[place]/24/2020   Depression    Diabetic neuropathy (HCC)[place] Diastolic dysfunction    Essential hypertension    GERD (gastroesophageal reflux disease)    Hyperlipidemia    Iron deficiency anemia    Left knee DJD    Lumbar spinal stenosis    Lymphedema 02/211-Mar-2017OTH LEGS   NASH (nonalcoholic steatohepatitis)    Peripheral edema    Peripheral edema 10/05/2015   Type 2 diabetes mellitus (HCC)      No Known Allergies   Current Outpatient Medications  Medication Sig Dispense Refill   allopurinol (ZYLOPRIM) 100 MG tablet Take 100 mg by mouth daily.     aspirin EC 81  MG tablet Take 81 mg by mouth daily.     baclofen (LIORESAL) 10 MG tablet Take 5 mg by mouth 3 (three) times daily as needed.     bumetanide (BUMEX) 2 MG tablet Take 1.5 tablets (3 mg total) by mouth 2 (two) times daily. 270 tablet 3   carvedilol (COREG) 12.5 MG tablet Take 1 tablet (12.5 mg total) by mouth 2 (two) times daily. 180 tablet 3   clotrimazole-betamethasone (LOTRISONE) cream Apply 1 Application topically daily. 30 g 0   diclofenac sodium (VOLTAREN) 1 % GEL Apply 4 g topically 4 (four) times daily as needed (pain). 100 g 3   Ferrous Sulfate (IRON) 325 (65 FE) MG TABS Take 1 tablet by mouth daily.      gabapentin (NEURONTIN) 400 MG capsule Take 1 capsule (400 mg total) by mouth 3 (three) times daily. 90 capsule 0   HYDROcodone-acetaminophen (NORCO) 10-325 MG tablet TAKE (1) TABLET BY MOUTH (3) TIMES DAILY AS NEEDED 90 tablet 0   Insulin Glargine (LANTUS SOLOSTAR) 100 UNIT/ML Solostar Pen Inject 5 Units into the skin daily at 10 pm. (Patient taking differently: Inject 25 Units into the skin at bedtime.) 15 mL 0   levothyroxine (SYNTHROID) 112 MCG tablet TAKE 1 TABLET BY MOUTH ONCE DAILY. 30 tablet  0   magnesium oxide (MAG-OX) 400 MG tablet Take 1 tablet (400 mg total) by mouth 2 (two) times daily. 60 tablet 11   meloxicam (MOBIC) 7.5 MG tablet Take 1 tablet (7.5 mg total) by mouth daily. 30 tablet 5   metFORMIN (GLUCOPHAGE) 1000 MG tablet TAKE ONE TABLET BY MOUTH TWICE DAILY WITH A MEAL. 180 tablet 3   metolazone (ZAROXOLYN) 2.5 MG tablet Two Times Weekly on Monday and Thursday 10 tablet 11   nystatin cream (MYCOSTATIN) APPLY TO AFFECTED AREA TWICE DAILY. 30 g 0   pantoprazole (PROTONIX) 40 MG tablet Take 1 tablet (40 mg total) by mouth daily. 30 tablet 11   potassium chloride (K-DUR) 10 MEQ tablet Take 1 tablet (10 mEq total) by mouth 2 (two) times daily. 180 tablet 3   pravastatin (PRAVACHOL) 40 MG tablet TAKE ONE TABLET BY MOUTH DAILY. 90 tablet 0   sacubitril-valsartan (ENTRESTO)  49-51 MG Take 1 tablet by mouth 2 (two) times daily. 60 tablet 11   tamsulosin (FLOMAX) 0.4 MG CAPS capsule TAKE (1) CAPSULE BY MOUTH EVERY DAY. 90 capsule 3   tiZANidine (ZANAFLEX) 2 MG tablet Take 1 tablet (2 mg total) by mouth every 8 (eight) hours as needed for muscle spasms. 30 tablet 0   traZODone (DESYREL) 100 MG tablet TAKE 1 TABLET BY MOUTH AT BEDTIME AS NEEDED FOR SLEEP. 90 tablet 2   Vitamin D, Ergocalciferol, (DRISDOL) 1.25 MG (50000 UNIT) CAPS capsule Take 1 capsule (50,000 Units total) by mouth every 7 (seven) days. 12 capsule 1   XTAMPZA ER 9 MG C12A Take 1 capsule by mouth 2 (two) times daily.     nitrofurantoin, macrocrystal-monohydrate, (MACROBID) 100 MG capsule Take 1 capsule (100 mg total) by mouth 2 (two) times daily. (Patient not taking: Reported on 05/22/2022) 10 capsule 0   No current facility-administered medications for this visit.     Past Surgical History:  Procedure Laterality Date   BIOPSY N/A 07/21/2014   Procedure: BIOPSY;  Surgeon: Daneil Dolin, MD;  Location: [facility];  Service: Endoscopy;  Laterality: N/A;   CATARACT EXTRACTION W/PHACO Right 07/08/2016   Procedure: CATARACT EXTRACTION PHACO AND INTRAOCULAR LENS PLACEMENT RIGHT EYE;  Surgeon: Tonny , MD;  Location: [facility];  Service: Ophthalmology;  Laterality: Right;  CDE: 9.01   CATARACT EXTRACTION W/PHACO Left 07/22/2016   Procedure: CATARACT EXTRACTION PHACO AND INTRAOCULAR LENS PLACEMENT LEFT EYE CDE=10.69;  Surgeon: Tonny , MD;  Location: [facility];  Service: Ophthalmology;  Laterality: Left;  left -    COLONOSCOPY  2011   [facility]: normal colon, normal distal ileum   COLONOSCOPY WITH PROPOFOL N/A 07/21/2014   Procedure: ATTEMPTED COLONOSCOPY WITH PROPOFOL-HAD TO STOP DUE TO BRADYCARDIA;  Surgeon: Daneil Dolin, MD;  Location: [facility];  Service: Endoscopy;  Laterality: N/A;   EGD with enteroscopy  2011   [facility]: normal esophagus and stomach. Normal duodenum, jejunum. No evidence of AVMs.     ESOPHAGOGASTRODUODENOSCOPY (EGD) WITH PROPOFOL N/A 07/21/2014   Procedure: ESOPHAGOGASTRODUODENOSCOPY (EGD) WITH PROPOFOL;  Surgeon: Daneil Dolin, MD;  Location: [facility];  Service: Endoscopy;  Laterality: N/A;   HERNIA REPAIR     KNEE ARTHROSCOPY WITH MEDIAL MENISECTOMY Left 11/06/2012   Procedure: KNEE ARTHROSCOPY WITH MEDIAL MENISECTOMY;  Surgeon: Carole Civil, MD;  Location: [facility];  Service: Orthopedics;  Laterality: Left;   Lipoma removal     Stomach   LUMBAR LAMINECTOMY/DECOMPRESSION MICRODISCECTOMY Left 08/30/2013   Procedure: LUMBAR LAMINECTOMY/DECOMPRESSION MICRODISCECTOMY LEFT  LUMBAR  TWO THREE;  Surgeon: Otilio Connors, MD;  Location: [place] [facility];  Service: Neurosurgery;  Laterality: Left;   RIGHT/LEFT HEART CATH AND CORONARY ANGIOGRAPHY N/A 09/22/2019   Procedure: RIGHT/LEFT HEART CATH AND CORONARY ANGIOGRAPHY;  Surgeon: Martinique, Peter M, MD;  Location: [place] [facility];  Service: Cardiovascular;  Laterality: N/A;   SHOULDER SURGERY     Rght-rotator cuff   TOOTH EXTRACTION       No Known Allergies    Family History  Problem Relation Age of Onset   Heart disease Mother    Hyperlipidemia Mother    Hypertension Mother    Depression Mother    Diabetes Mother    Rectal cancer Mother    Cancer Mother    Heart disease Father    Hypertension Father    Hyperlipidemia Father    Diabetes Father    Cancer Father    Heart disease Sister    Hyperlipidemia Sister    Hypertension Sister    Diabetes Sister    Diabetes Brother    Heart disease Sister    Hyperlipidemia Sister    Hypertension Sister    Heart disease Sister    Hyperlipidemia Sister    Hypertension Sister    Diabetes Sister    Diabetes Brother      Social History Mr. Dibello reports that he quit smoking about 22 years ago. His smoking use included cigarettes. He started smoking about 67 years ago. He has a 110.00 pack-year smoking history. He has never used smokeless tobacco. Mr. Schlosser reports no  history of alcohol use.   Review of Systems CONSTITUTIONAL: No weight loss, fever, chills, weakness or fatigue.  HEENT: Eyes: No visual loss, blurred vision, double vision or yellow sclerae.No hearing loss, sneezing, congestion, runny nose or sore throat.  SKIN: No rash or itching.  CARDIOVASCULAR: per hpi RESPIRATORY: No shortness of breath, cough or sputum.  GASTROINTESTINAL: No anorexia, nausea, vomiting or diarrhea. No abdominal pain or blood.  GENITOURINARY: No burning on urination, no polyuria NEUROLOGICAL: No headache, dizziness, syncope, paralysis, ataxia, numbness or tingling in the extremities. No change in bowel or bladder control.  MUSCULOSKELETAL: No muscle, back pain, joint pain or stiffness.  LYMPHATICS: No enlarged nodes. No history of splenectomy.  PSYCHIATRIC: No history of depression or anxiety.  ENDOCRINOLOGIC: No reports of sweating, cold or heat intolerance. No polyuria or polydipsia.  Marland Kitchen   Physical Examination Vitals:   05/22/22 0904 05/22/22 0931  BP: (!) 142/78 138/80  Pulse: 71   SpO2: 96%    Filed Weights   05/22/22 0904  Weight: 206 lb (93.4 kg)    Gen: resting comfortably, no acute distress HEENT: no scleral icterus, pupils equal round and reactive, no palptable cervical adenopathy,  CV: RRR, no m/r/g no jvd Resp: Clear to auscultation bilaterally GI: abdomen is soft, non-tender, non-distended, normal bowel sounds, no hepatosplenomegaly MSK: extremities are warm, no edema.  Skin: warm, no rash Neuro:  no focal deficits Psych: appropriate affect   Diagnostic Studies  08/2019 echo IMPRESSIONS     1. Left ventricular ejection fraction, by visual estimation, is <20%. The  left ventricle has severely decreased function. There is no left  ventricular hypertrophy.   2. The left ventricle demonstrates regional wall motion abnormalities.   3. LVEF is approximately 15 to 20% with severe hypokinesis and akinesis  if the distal 1/3 of left  ventricle.. Only extreme proximal portions of LV  thicken during systole. Compared to echo from 2017, LVEF is  now severely  depressed.   4. Global right ventricle has normal systolic function.The right  ventricular size is normal. No increase in right ventricular wall  thickness.   5. Left atrial size was normal.   6. Right atrial size was normal.   7. Mild mitral annular calcification.   8. The mitral valve is abnormal. Trivial mitral valve regurgitation.   9. The tricuspid valve is normal in structure. Tricuspid valve  regurgitation is mild.  10. The aortic valve is abnormal. Aortic valve regurgitation is not  visualized. Mild aortic valve sclerosis without stenosis.  11. The pulmonic valve was normal in structure. Pulmonic valve  regurgitation is not visualized.  12. The inferior vena cava is dilated in size with <50% respiratory  variability, suggesting right atrial pressure of 15 mmHg.      08/2019 cath LV end diastolic pressure is normal.   1. Mild nonobstructive CAD 2. Normal LV filling pressures 3. Normal right heart pressures. 4. Normal cardiac output. Index 2.7.      Jan 2023 echo  1. Left ventricular ejection fraction, by estimation, is 50 to 55%. The  left ventricle has low normal function. The left ventricle demonstrates  regional wall motion abnormalities (see scoring diagram/findings for  description). Left ventricular diastolic   parameters are consistent with Grade II diastolic dysfunction  (pseudonormalization). Elevated left ventricular end-diastolic pressure.   2. Right ventricular systolic function is normal. The right ventricular  size is normal. Tricuspid regurgitation signal is inadequate for assessing  PA pressure.   3. Left atrial size was moderately dilated.   4. The mitral valve is degenerative. Mild mitral valve regurgitation.   5. The aortic valve is tricuspid. There is mild calcification of the  aortic valve. Aortic valve regurgitation is  trivial. Aortic valve  sclerosis/calcification is present, without any evidence of aortic  stenosis. Aortic valve mean gradient measures 6.0  mmHg.   6. The inferior vena cava is normal in size with greater than 50%  respiratory variability, suggesting right atrial pressure of 3 mmHg.    Assessment and Plan  1. Chronic systolic/diastolic HF - repeat echo shows LVEF has normalized, ongoing diastolic dysfunction - edema is overall stable, continue current diuretics. With additional fluid loss with diarrhea we will recheck bmet/mg since on high dose diuretics.    2. HTN - mildly elevated, from review essentailly runs 130s/80s. Has not taken meds yet today - continue current meds     [name], M.D.

## 2022-05-22 NOTE — Patient Instructions (Signed)
Medication Instructions:  Your physician recommends that you continue on your current medications as directed. Please refer to the Current Medication list given to you today.   Labwork: -BMET -CBC -MAG  Testing/Procedures: None  Follow-Up: Follow up with Dr. Harl Bowie in 6 months.   Any Other Special Instructions Will Be Listed Below (If Applicable).     If you need a refill on your cardiac medications before your next appointment, please call your pharmacy.

## 2022-05-30 ENCOUNTER — Other Ambulatory Visit (HOSPITAL_COMMUNITY)
Admission: RE | Admit: 2022-05-30 | Discharge: 2022-05-30 | Disposition: A | Discharge status: Home / Self Care | Payer: Medicare Other | Source: Ambulatory Visit | Attending: Cardiology | Admitting: Cardiology

## 2022-05-30 DIAGNOSIS — I5022 Chronic systolic (congestive) heart failure: Secondary | ICD-10-CM | POA: Insufficient documentation

## 2022-05-30 LAB — CBC
HCT: 36.9 % — ABNORMAL LOW (ref 39.0–52.0)
Hemoglobin: 11.8 g/dL — ABNORMAL LOW (ref 13.0–17.0)
MCH: 26.6 pg (ref 26.0–34.0)
MCHC: 32 g/dL (ref 30.0–36.0)
MCV: 83.3 fL (ref 80.0–100.0)
Platelets: 179 10*3/uL (ref 150–400)
RBC: 4.43 MIL/uL (ref 4.22–5.81)
RDW: 15.1 % (ref 11.5–15.5)
WBC: 4 10*3/uL (ref 4.0–10.5)
nRBC: 0 % (ref 0.0–0.2)

## 2022-05-30 LAB — BASIC METABOLIC PANEL
Anion gap: 7 (ref 5–15)
BUN: 9 mg/dL (ref 8–23)
CO2: 28 mmol/L (ref 22–32)
Calcium: 8.7 mg/dL — ABNORMAL LOW (ref 8.9–10.3)
Chloride: 101 mmol/L (ref 98–111)
Creatinine, Ser: 0.75 mg/dL (ref 0.61–1.24)
GFR, Estimated: >60 mL/min (ref >60)
Glucose, Bld: 95 mg/dL (ref 70–99)
Potassium: 4 mmol/L (ref 3.5–5.1)
Sodium: 136 mmol/L (ref 135–145)

## 2022-05-30 LAB — MAGNESIUM: Magnesium: 1.3 mg/dL — ABNORMAL LOW (ref 1.7–2.4)

## 2022-06-04 ENCOUNTER — Telehealth: Payer: Self-pay | Admitting: Cardiology

## 2022-06-04 NOTE — Telephone Encounter (Signed)
Spoke to [facility]- who stated that pt has only been taking Mag Ox 400 once daily. Michael [name] verbalized that she will add an extra tablet into pt's medication box.  Please advise if pt still needs to take medication TID.

## 2022-06-04 NOTE — Telephone Encounter (Signed)
Pt c/o medication issue:  1. Name of Medication: magnesium oxide (MAG-OX) 400 MG tablet  2. How are you currently taking this medication (dosage and times per day)? 1 tablet daily   3. Are you having a reaction (difficulty breathing--STAT)? No   4. What is your medication issue? Home health nurse called back to clarify how pt is taking magnesium. States patient is only taking once a day per the [place], advised we have listed for him to take two tablets. She is requesting a callback before she leave the patient's home today to confirm if he should be taking two daily. Please advise.

## 2022-06-04 NOTE — Telephone Encounter (Signed)
[name], MD  06/04/2022 10:37 AM EDT     Labs look good other than low magnesium. Clarify taking magnesium oxide 480m bid, if so increase to tid please     JZandra AbtsMD

## 2022-06-06 NOTE — Telephone Encounter (Signed)
BID dosing would be fine  [name] MD

## 2022-06-06 NOTE — Telephone Encounter (Signed)
Verbalized BID dosing to [facility], Elmyra Ricks who voiced understanding.

## 2022-06-13 ENCOUNTER — Other Ambulatory Visit: Payer: Self-pay | Admitting: Internal Medicine

## 2022-06-13 DIAGNOSIS — G894 Chronic pain syndrome: Secondary | ICD-10-CM

## 2022-06-20 ENCOUNTER — Telehealth: Payer: Self-pay

## 2022-06-20 ENCOUNTER — Ambulatory Visit (INDEPENDENT_AMBULATORY_CARE_PROVIDER_SITE_OTHER): Payer: Medicare Other | Admitting: Internal Medicine

## 2022-06-20 ENCOUNTER — Encounter: Payer: Self-pay | Admitting: Internal Medicine

## 2022-06-20 VITALS — BP 136/80 | HR 85 | Ht 59.0 in | Wt 203.4 lb

## 2022-06-20 DIAGNOSIS — G894 Chronic pain syndrome: Secondary | ICD-10-CM

## 2022-06-20 DIAGNOSIS — E038 Other specified hypothyroidism: Secondary | ICD-10-CM | POA: Diagnosis not present

## 2022-06-20 DIAGNOSIS — E559 Vitamin D deficiency, unspecified: Secondary | ICD-10-CM | POA: Diagnosis not present

## 2022-06-20 DIAGNOSIS — I10 Essential (primary) hypertension: Secondary | ICD-10-CM

## 2022-06-20 DIAGNOSIS — L304 Erythema intertrigo: Secondary | ICD-10-CM | POA: Diagnosis not present

## 2022-06-20 DIAGNOSIS — E782 Mixed hyperlipidemia: Secondary | ICD-10-CM | POA: Diagnosis not present

## 2022-06-20 DIAGNOSIS — Z0001 Encounter for general adult medical examination with abnormal findings: Secondary | ICD-10-CM | POA: Insufficient documentation

## 2022-06-20 DIAGNOSIS — Z794 Long term (current) use of insulin: Secondary | ICD-10-CM

## 2022-06-20 DIAGNOSIS — E1142 Type 2 diabetes mellitus with diabetic polyneuropathy: Secondary | ICD-10-CM | POA: Diagnosis not present

## 2022-06-20 DIAGNOSIS — I890 Lymphedema, not elsewhere classified: Secondary | ICD-10-CM

## 2022-06-20 DIAGNOSIS — B351 Tinea unguium: Secondary | ICD-10-CM | POA: Diagnosis not present

## 2022-06-20 MED ORDER — CLOTRIMAZOLE-BETAMETHASONE 1-0.05 % EX CREA: 1.0000 | TOPICAL_CREAM | Freq: Every day | CUTANEOUS | 0 refills | Status: DC

## 2022-06-20 MED ORDER — TERBINAFINE HCL 250 MG PO TABS: 250.0000 mg | ORAL_TABLET | Freq: Every day | ORAL | 2 refills | Status: DC

## 2022-06-20 NOTE — Assessment & Plan Note (Signed)
BP Readings from Last 1 Encounters:  06/20/22 136/80   Well-controlled today On Entresto and Coreg for HFrEF, also on diuretics- followed by Cardiology Counseled for compliance with the medications Advised DASH diet

## 2022-06-20 NOTE — Telephone Encounter (Signed)
See below

## 2022-06-20 NOTE — Assessment & Plan Note (Addendum)
Lab Results  Component Value Date   HGBA1C 7.7 (H) 12/21/2021   On Lantus 25 U qHS and Metformin, followed by [facility] clinic On ARNI and statin Has diabetic neuropathy - on Gabapentin

## 2022-06-20 NOTE — Progress Notes (Addendum)
Established Patient Office Visit  Subjective:  Patient ID: Michael [name], male    DOB: Apr 13, 1946  Age: 76 y.o. MRN: [id number]  CC:  Chief Complaint  Patient presents with   Annual Exam    Annual exam has blisters on both feet     HPI Michael [name] is a 76 y.o. male with past medical history of HTN, HFrEF, DM with neuropathy, COPD, GERD, hypothyroidism, chronic pain syndrome and chronic lymphedema who presents for annual physical.  Lymphedema: He has chronic bilateral leg swelling, for which he was getting physical therapy through [place].  He has history of HFrEF, for which he takes Entresto, Bumex and metolazone.  He has very limited mobility currently. He c/o creases over his feet and itching in the area.  He has tried using Lotrisone cream with some relief.  He reports noticing blister over bilateral great toes.  He was recently seen at [facility] and was advised to see podiatrist.  His HbA1C was 7.7 in 03/23. He has been taking Lantus 25 U qHS and Metformin. Denies any polyphagia, but admits having chronic fatigue.  He takes hydrocodone-acetaminophen for chronic pain due to lumbar spinal stenosis and physical deconditioning from chronic immobility.  He had been seeing [facility] for chronic pain management. It was difficult for him to travel to [facility] for pain management.  HTN: His BP is well controlled today.  Denies any headache, dizziness, chest pain or palpitations currently.      Past Medical History:  Diagnosis Date   Arthritis    Congestive heart disease ([place]) 11/2015   COPD (chronic obstructive pulmonary disease) (HCC)    DDD (degenerative disc disease), lumbosacral    Demand ischemia ([place]) 09/23/2019   Depression    Diabetic neuropathy (HCC)    Diastolic dysfunction    Essential hypertension    GERD (gastroesophageal reflux disease)    Hyperlipidemia    Iron deficiency anemia    Left knee DJD    Lumbar spinal stenosis    Lymphedema 11/2015   BOTH  LEGS   NASH (nonalcoholic steatohepatitis)    Peripheral edema    Peripheral edema 10/05/2015   Type 2 diabetes mellitus ([place])     Past Surgical History:  Procedure Laterality Date   BIOPSY N/A 07/21/2014   Procedure: BIOPSY;  Surgeon: Daneil Dolin, MD;  Location: [facility];  Service: Endoscopy;  Laterality: N/A;   CATARACT EXTRACTION W/PHACO Right 07/08/2016   Procedure: CATARACT EXTRACTION PHACO AND INTRAOCULAR LENS PLACEMENT RIGHT EYE;  Surgeon: Tonny Branch, MD;  Location: [facility];  Service: Ophthalmology;  Laterality: Right;  CDE: 9.01   CATARACT EXTRACTION W/PHACO Left 07/22/2016   Procedure: CATARACT EXTRACTION PHACO AND INTRAOCULAR LENS PLACEMENT LEFT EYE CDE=10.69;  Surgeon: Tonny Branch, MD;  Location: [facility];  Service: Ophthalmology;  Laterality: Left;  left -    COLONOSCOPY  2011   [facility]: normal colon, normal distal ileum   COLONOSCOPY WITH PROPOFOL N/A 07/21/2014   Procedure: ATTEMPTED COLONOSCOPY WITH PROPOFOL-HAD TO STOP DUE TO BRADYCARDIA;  Surgeon: Daneil Dolin, MD;  Location: [facility];  Service: Endoscopy;  Laterality: N/A;   EGD with enteroscopy  2011   [facility]: normal esophagus and stomach. Normal duodenum, jejunum. No evidence of AVMs.    ESOPHAGOGASTRODUODENOSCOPY (EGD) WITH PROPOFOL N/A 07/21/2014   Procedure: ESOPHAGOGASTRODUODENOSCOPY (EGD) WITH PROPOFOL;  Surgeon: Daneil Dolin, MD;  Location: [facility];  Service: Endoscopy;  Laterality: N/A;   HERNIA REPAIR  KNEE ARTHROSCOPY WITH MEDIAL MENISECTOMY Left 11/06/2012   Procedure: KNEE ARTHROSCOPY WITH MEDIAL MENISECTOMY;  Surgeon: Carole Civil, MD;  Location: [facility];  Service: Orthopedics;  Laterality: Left;   Lipoma removal     Stomach   LUMBAR LAMINECTOMY/DECOMPRESSION MICRODISCECTOMY Left 08/30/2013   Procedure: LUMBAR LAMINECTOMY/DECOMPRESSION MICRODISCECTOMY LEFT  LUMBAR TWO THREE;  Surgeon: Otilio Connors, MD;  Location: [facility];  Service: Neurosurgery;  Laterality: Left;   RIGHT/LEFT HEART CATH AND  CORONARY ANGIOGRAPHY N/A 09/22/2019   Procedure: RIGHT/LEFT HEART CATH AND CORONARY ANGIOGRAPHY;  Surgeon: Martinique, Peter M, MD;  Location: [facility];  Service: Cardiovascular;  Laterality: N/A;   SHOULDER SURGERY     Rght-rotator cuff   TOOTH EXTRACTION      Family History  Problem Relation Age of Onset   Heart disease Mother    Hyperlipidemia Mother    Hypertension Mother    Depression Mother    Diabetes Mother    Rectal cancer Mother    Cancer Mother    Heart disease Father    Hypertension Father    Hyperlipidemia Father    Diabetes Father    Cancer Father    Heart disease Sister    Hyperlipidemia Sister    Hypertension Sister    Diabetes Sister    Diabetes Brother    Heart disease Sister    Hyperlipidemia Sister    Hypertension Sister    Heart disease Sister    Hyperlipidemia Sister    Hypertension Sister    Diabetes Sister    Diabetes Brother     Social History   Socioeconomic History   Marital status: Widowed    Spouse name: Not on file   Number of children: Not on file   Years of education: 12   Highest education level: Not on file  Occupational History   Not on file  Tobacco Use   Smoking status: Former    Packs/day: 2.50    Years: 44.00    Total pack years: 110.00    Types: Cigarettes    Start date: 05/07/1955    Quit date: 10/01/1999    Years since quitting: 22.7   Smokeless tobacco: Never  Vaping Use   Vaping Use: Never used  Substance and Sexual Activity   Alcohol use: No    Alcohol/week: 0.0 standard drinks of alcohol   Drug use: No   Sexual activity: Yes    Birth control/protection: None  Other Topics Concern   Not on file  Social History Narrative   Not on file   Social Determinants of Health   Financial Resource Strain: Low Risk  (04/18/2022)   Overall Financial Resource Strain (CARDIA)    Difficulty of Paying Living Expenses: Not hard at all  Food Insecurity: No Food Insecurity (04/18/2022)   Hunger Vital Sign    Worried  About Running Out of Food in the Last Year: Never true    Ran Out of Food in the Last Year: Never true  Transportation Needs: No Transportation Needs (04/18/2022)   PRAPARE - Hydrologist (Medical): No    Lack of Transportation (Non-Medical): No  Physical Activity: Inactive (04/18/2022)   Exercise Vital Sign    Days of Exercise per Week: 0 days    Minutes of Exercise per Session: 0 min  Stress: No Stress Concern Present (04/18/2022)   [employer]    Feeling of Stress : Not at all  Social Connections: Moderately Isolated (04/18/2022)   Social Connection and Isolation Panel [NHANES]    Frequency of Communication with Friends and Family: More than three times a week    Frequency of Social Gatherings with Friends and Family: More than three times a week    Attends Religious Services: 1 to 4 times per year    Active Member of [employer] or Organizations: No    Attends Archivist Meetings: Never    Marital Status: Widowed  Intimate Partner Violence: Not At Risk (04/18/2022)   Humiliation, Afraid, Rape, and Kick questionnaire    Fear of Current or Ex-Partner: No    Emotionally Abused: No    Physically Abused: No    Sexually Abused: No    Outpatient Medications Prior to Visit  Medication Sig Dispense Refill   allopurinol (ZYLOPRIM) 100 MG tablet Take 100 mg by mouth daily.     aspirin EC 81 MG tablet Take 81 mg by mouth daily.     baclofen (LIORESAL) 10 MG tablet Take 5 mg by mouth 3 (three) times daily as needed.     bumetanide (BUMEX) 2 MG tablet Take 1.5 tablets (3 mg total) by mouth 2 (two) times daily. 270 tablet 3   carvedilol (COREG) 12.5 MG tablet Take 1 tablet (12.5 mg total) by mouth 2 (two) times daily. 180 tablet 3   diclofenac sodium (VOLTAREN) 1 % GEL Apply 4 g topically 4 (four) times daily as needed (pain). 100 g 3   Ferrous Sulfate (IRON) 325 (65 FE) MG TABS Take 1 tablet by  mouth daily.      gabapentin (NEURONTIN) 400 MG capsule Take 1 capsule (400 mg total) by mouth 3 (three) times daily. 90 capsule 0   HYDROcodone-acetaminophen (NORCO) 10-325 MG tablet TAKE (1) TABLET BY MOUTH (3) TIMES DAILY AS NEEDED 90 tablet 0   Insulin Glargine (LANTUS SOLOSTAR) 100 UNIT/ML Solostar Pen Inject 5 Units into the skin daily at 10 pm. (Patient taking differently: Inject 25 Units into the skin at bedtime.) 15 mL 0   levothyroxine (SYNTHROID) 88 MCG tablet Take 88 mcg by mouth daily before breakfast.     magnesium oxide (MAG-OX) 400 MG tablet Take 1 tablet (400 mg total) by mouth 2 (two) times daily. 60 tablet 11   meloxicam (MOBIC) 7.5 MG tablet Take 1 tablet (7.5 mg total) by mouth daily. 30 tablet 5   metFORMIN (GLUCOPHAGE) 1000 MG tablet TAKE ONE TABLET BY MOUTH TWICE DAILY WITH A MEAL. 180 tablet 3   metolazone (ZAROXOLYN) 2.5 MG tablet Two Times Weekly on Monday and Thursday 10 tablet 11   nystatin cream (MYCOSTATIN) APPLY TO AFFECTED AREA TWICE DAILY. 30 g 0   pantoprazole (PROTONIX) 40 MG tablet Take 1 tablet (40 mg total) by mouth daily. 30 tablet 11   potassium chloride (K-DUR) 10 MEQ tablet Take 1 tablet (10 mEq total) by mouth 2 (two) times daily. 180 tablet 3   pravastatin (PRAVACHOL) 40 MG tablet TAKE ONE TABLET BY MOUTH DAILY. 90 tablet 0   sacubitril-valsartan (ENTRESTO) 49-51 MG Take 1 tablet by mouth 2 (two) times daily. 60 tablet 11   tamsulosin (FLOMAX) 0.4 MG CAPS capsule TAKE (1) CAPSULE BY MOUTH EVERY DAY. 90 capsule 3   tiZANidine (ZANAFLEX) 2 MG tablet Take 1 tablet (2 mg total) by mouth every 8 (eight) hours as needed for muscle spasms. 30 tablet 0   traZODone (DESYREL) 100 MG tablet TAKE 1 TABLET BY MOUTH AT BEDTIME AS NEEDED FOR SLEEP.  90 tablet 2   Vitamin D, Ergocalciferol, (DRISDOL) 1.25 MG (50000 UNIT) CAPS capsule Take 1 capsule (50,000 Units total) by mouth every 7 (seven) days. 12 capsule 1   XTAMPZA ER 9 MG C12A Take 1 capsule by mouth 2 (two)  times daily.     clotrimazole-betamethasone (LOTRISONE) cream Apply 1 Application topically daily. 30 g 0   levothyroxine (SYNTHROID) 112 MCG tablet TAKE 1 TABLET BY MOUTH ONCE DAILY. 30 tablet 0   nitrofurantoin, macrocrystal-monohydrate, (MACROBID) 100 MG capsule Take 1 capsule (100 mg total) by mouth 2 (two) times daily. (Patient not taking: Reported on 06/20/2022) 10 capsule 0   No facility-administered medications prior to visit.    No Known Allergies  ROS Review of Systems  Constitutional:  Negative for chills and fever.  HENT:  Negative for congestion and sore throat.   Eyes:  Negative for pain and discharge.  Respiratory:  Negative for cough and shortness of breath.   Cardiovascular:  Positive for leg swelling. Negative for chest pain and palpitations.  Gastrointestinal:  Negative for diarrhea, nausea and vomiting.  Endocrine: Negative for polydipsia and polyuria.  Genitourinary:  Negative for dysuria and hematuria.  Musculoskeletal:  Positive for arthralgias, back pain and neck pain. Negative for neck stiffness.  Skin:  Positive for color change and rash.  Neurological:  Positive for weakness and numbness. Negative for dizziness and headaches.  Psychiatric/Behavioral:  Negative for agitation and behavioral problems.       Objective:    Physical Exam Vitals reviewed.  Constitutional:      General: He is not in acute distress.    Appearance: He is obese. He is not diaphoretic.     Comments: In wheelchair  HENT:     Head: Normocephalic and atraumatic.     Nose: Nose normal.     Mouth/Throat:     Mouth: Mucous membranes are moist.  Eyes:     General: No scleral icterus.    Extraocular Movements: Extraocular movements intact.  Cardiovascular:     Rate and Rhythm: Normal rate and regular rhythm.     Pulses: Normal pulses.     Heart sounds: Normal heart sounds. No murmur heard. Pulmonary:     Breath sounds: Normal breath sounds. No wheezing or rales.  Abdominal:      Palpations: Abdomen is soft.     Tenderness: There is no abdominal tenderness.  Musculoskeletal:     Cervical back: Neck supple. No tenderness.     Right lower leg: Edema (3+) present.     Left lower leg: Edema (3+) present.  Skin:    General: Skin is warm.     Findings: Erythema (Over b/l LE) and rash (Maculopapular eruptions over b/l feet) present.     Comments: Blister over great toes Whitish plaques in between toes  Neurological:     General: No focal deficit present.     Mental Status: He is alert and oriented to person, place, and time.     Sensory: Sensory deficit (B/l feet) present.     Motor: Weakness (B/l LE - 1/5) present.  Psychiatric:        Mood and Affect: Mood normal.        Behavior: Behavior normal.     BP 136/80 (BP Location: Left Arm, Patient Position: Sitting, Cuff Size: Normal)   Pulse 85   Ht 4' 11"  (1.499 m)   Wt 203 lb 6.4 oz (92.3 kg)   SpO2 98%   BMI 41.08 kg/m  Wt Readings from Last 3 Encounters:  06/20/22 203 lb 6.4 oz (92.3 kg)  05/22/22 206 lb (93.4 kg)  04/25/22 211 lb (95.7 kg)    Lab Results  Component Value Date   TSH 3.250 04/13/2021   Lab Results  Component Value Date   WBC 4.0 05/30/2022   HGB 11.8 (L) 05/30/2022   HCT 36.9 (L) 05/30/2022   MCV 83.3 05/30/2022   PLT 179 05/30/2022   Lab Results  Component Value Date   NA 136 05/30/2022   K 4.0 05/30/2022   CO2 28 05/30/2022   GLUCOSE 95 05/30/2022   BUN 9 05/30/2022   CREATININE 0.75 05/30/2022   BILITOT 0.4 12/21/2021   ALKPHOS 76 12/21/2021   AST 25 12/21/2021   ALT 26 12/21/2021   PROT 6.6 12/21/2021   ALBUMIN 4.3 12/21/2021   CALCIUM 8.7 (L) 05/30/2022   ANIONGAP 7 05/30/2022   EGFR 93 12/21/2021   Lab Results  Component Value Date   CHOL 143 04/13/2021   Lab Results  Component Value Date   HDL 49 04/13/2021   Lab Results  Component Value Date   LDLCALC 70 04/13/2021   Lab Results  Component Value Date   TRIG 137 04/13/2021   Lab Results   Component Value Date   CHOLHDL 3.5 04/10/2020   Lab Results  Component Value Date   HGBA1C 7.7 (H) 12/21/2021      Assessment & Plan:   Problem List Items Addressed This Visit       Cardiovascular and Mediastinum   Hypertension    BP Readings from Last 1 Encounters:  06/20/22 136/80  Well-controlled today On Entresto and Coreg for HFrEF, also on diuretics- followed by Cardiology Counseled for compliance with the medications Advised DASH diet      Relevant Orders   CBC with Differential/Platelet     Endocrine   Hypothyroidism    On Levothyroxine 88 mcg QD Check TSH and free T4      Relevant Medications   levothyroxine (SYNTHROID) 88 MCG tablet   Other Relevant Orders   CMP14+EGFR   CBC with Differential/Platelet   TSH + free T4   Diabetes mellitus ([place])    Lab Results  Component Value Date   HGBA1C 7.7 (H) 12/21/2021  On Lantus 25 U qHS and Metformin, followed by [place] [facility] On ARNI and statin Has diabetic neuropathy - on Gabapentin      Relevant Orders   Hemoglobin A1c   CMP14+EGFR   Diabetic peripheral neuropathy (HCC)    On Gabapentin Referred to Podiatry as he has onychomycosis and blisters over great toe b/l      Relevant Orders   Ambulatory referral to Podiatry   CBC with Differential/Platelet     Musculoskeletal and Integument   Onychomycosis    Has thick toenails, whitish plaques between toes and likely fungal infection in the folds due to lymphedema Uses Lotrisone cream Referred to podiatry      Relevant Medications   terbinafine (LAMISIL) 250 MG tablet   clotrimazole-betamethasone (LOTRISONE) cream     Other   Hyperlipidemia    On statin      Relevant Orders   Lipid panel   Chronic pain syndrome    Has diabetic neuropathy and lumbar spinal stenosis with polyarthritis Will continue Norco 10-325 mg, but at 3 times daily dose instead of 4 times daily dose-patient agrees Had been following up with [facility], more  convenient for him to come to [facility]local [facility]  PDMP reviewed, refill provided      Lymphedema of lower extremity    Chronic Applies compression bandages Gets PT through [place] On diuretics Has likely fungal infection over dorsal aspect of b/l feet due to folds created from chronic leg swelling - Lotrisone cream refilled      Relevant Orders   Ambulatory referral to Podiatry   Vitamin D deficiency   Relevant Orders   VITAMIN D 25 Hydroxy (Vit-D Deficiency, Fractures)   Encounter for general adult medical examination with abnormal findings - Primary    Physical exam as documented. Fasting blood tests ordered.      Other Visit Diagnoses     Intertrigo       Relevant Medications   clotrimazole-betamethasone (LOTRISONE) cream       Meds ordered this encounter  Medications   terbinafine (LAMISIL) 250 MG tablet    Sig: Take 1 tablet (250 mg total) by mouth daily.    Dispense:  30 tablet    Refill:  2   clotrimazole-betamethasone (LOTRISONE) cream    Sig: Apply 1 Application topically daily.    Dispense:  45 g    Refill:  0    Follow-up: Return in about 3 months (around 09/19/2022) for DM and chronic pain.    Lindell Spar, MD

## 2022-06-20 NOTE — Telephone Encounter (Signed)
Please send to [facility] who does referrals

## 2022-06-20 NOTE — Telephone Encounter (Signed)
Michael [name] called form [facility] received referral they are not accepting [employer] his insurance.

## 2022-06-20 NOTE — Assessment & Plan Note (Signed)
Physical exam as documented. Fasting blood tests ordered.

## 2022-06-20 NOTE — Assessment & Plan Note (Signed)
Chronic Applies compression bandages Gets PT through [place] On diuretics Has likely fungal infection over dorsal aspect of b/l feet due to folds created from chronic leg swelling - Lotrisone cream refilled

## 2022-06-20 NOTE — Assessment & Plan Note (Signed)
On statin.

## 2022-06-20 NOTE — Assessment & Plan Note (Addendum)
On Levothyroxine 88 mcg QD Check TSH and free T4

## 2022-06-20 NOTE — Assessment & Plan Note (Signed)
On Gabapentin Referred to Podiatry as he has onychomycosis and blisters over great toe b/l

## 2022-06-20 NOTE — Patient Instructions (Signed)
Please take Terbinafine for toenail infection.  You are being referred to Podiatry.  Please continue to take medications as prescribed.  Please continue to follow low carb diet.

## 2022-06-20 NOTE — Assessment & Plan Note (Signed)
Has thick toenails, whitish plaques between toes and likely fungal infection in the folds due to lymphedema Uses Lotrisone cream Referred to podiatry

## 2022-06-20 NOTE — Assessment & Plan Note (Addendum)
Has diabetic neuropathy and lumbar spinal stenosis with polyarthritis Will continue Norco 10-325 mg, but at 3 times daily dose instead of 4 times daily dose-patient agrees Had been following up with [facility], more convenient for him to come to [facility] PDMP reviewed, refill provided

## 2022-06-21 LAB — CMP14+EGFR
ALT: 15 IU/L (ref 0–44)
AST: 18 IU/L (ref 0–40)
Albumin/Globulin Ratio: 2 (ref 1.2–2.2)
Albumin: 4.2 g/dL (ref 3.8–4.8)
Alkaline Phosphatase: 65 IU/L (ref 44–121)
BUN/Creatinine Ratio: 12 (ref 10–24)
BUN: 9 mg/dL (ref 8–27)
Bilirubin Total: 0.4 mg/dL (ref 0.0–1.2)
CO2: 26 mmol/L (ref 20–29)
Calcium: 9.7 mg/dL (ref 8.6–10.2)
Chloride: 97 mmol/L (ref 96–106)
Creatinine, Ser: 0.78 mg/dL (ref 0.76–1.27)
Globulin, Total: 2.1 g/dL (ref 1.5–4.5)
Glucose: 92 mg/dL (ref 70–99)
Potassium: 4.7 mmol/L (ref 3.5–5.2)
Sodium: 139 mmol/L (ref 134–144)
Total Protein: 6.3 g/dL (ref 6.0–8.5)
eGFR: 93 mL/min/{1.73_m2} (ref >59)

## 2022-06-21 LAB — CBC WITH DIFFERENTIAL/PLATELET
Basophils Absolute: 0.1 10*3/uL (ref 0.0–0.2)
Basos: 2 %
EOS (ABSOLUTE): 0.1 10*3/uL (ref 0.0–0.4)
Eos: 3 %
Hematocrit: 38.7 % (ref 37.5–51.0)
Hemoglobin: 12.5 g/dL — ABNORMAL LOW (ref 13.0–17.7)
Immature Grans (Abs): 0 10*3/uL (ref 0.0–0.1)
Immature Granulocytes: 0 %
Lymphocytes Absolute: 1.1 10*3/uL (ref 0.7–3.1)
Lymphs: 24 %
MCH: 26.7 pg (ref 26.6–33.0)
MCHC: 32.3 g/dL (ref 31.5–35.7)
MCV: 83 fL (ref 79–97)
Monocytes Absolute: 0.4 10*3/uL (ref 0.1–0.9)
Monocytes: 8 %
Neutrophils Absolute: 2.7 10*3/uL (ref 1.4–7.0)
Neutrophils: 63 %
Platelets: 221 10*3/uL (ref 150–450)
RBC: 4.69 x10E6/uL (ref 4.14–5.80)
RDW: 15.3 % (ref 11.6–15.4)
WBC: 4.3 10*3/uL (ref 3.4–10.8)

## 2022-06-21 LAB — LIPID PANEL
Chol/HDL Ratio: 2.7 ratio (ref 0.0–5.0)
Cholesterol, Total: 128 mg/dL (ref 100–199)
HDL: 47 mg/dL (ref >39)
LDL Chol Calc (NIH): 56 mg/dL (ref 0–99)
Triglycerides: 142 mg/dL (ref 0–149)
VLDL Cholesterol Cal: 25 mg/dL (ref 5–40)

## 2022-06-21 LAB — VITAMIN D 25 HYDROXY (VIT D DEFICIENCY, FRACTURES): Vit D, 25-Hydroxy: 24.3 ng/mL — ABNORMAL LOW (ref 30.0–100.0)

## 2022-06-21 LAB — HEMOGLOBIN A1C
Est. average glucose Bld gHb Est-mCnc: 134 mg/dL
Hgb A1c MFr Bld: 6.3 % — ABNORMAL HIGH (ref 4.8–5.6)

## 2022-06-21 LAB — TSH+FREE T4
Free T4: 1.58 ng/dL (ref 0.82–1.77)
TSH: 4.76 u[IU]/mL — ABNORMAL HIGH (ref 0.450–4.500)

## 2022-06-21 NOTE — Telephone Encounter (Signed)
Sent to [facility]

## 2022-07-04 ENCOUNTER — Ambulatory Visit: Payer: Medicare Other | Admitting: Podiatry

## 2022-07-04 DIAGNOSIS — I890 Lymphedema, not elsewhere classified: Secondary | ICD-10-CM

## 2022-07-04 DIAGNOSIS — B351 Tinea unguium: Secondary | ICD-10-CM

## 2022-07-04 DIAGNOSIS — E1149 Type 2 diabetes mellitus with other diabetic neurological complication: Secondary | ICD-10-CM

## 2022-07-04 DIAGNOSIS — M79674 Pain in right toe(s): Secondary | ICD-10-CM

## 2022-07-04 DIAGNOSIS — M79675 Pain in left toe(s): Secondary | ICD-10-CM | POA: Diagnosis not present

## 2022-07-04 NOTE — Progress Notes (Signed)
Subjective:   Patient ID: Michael [name], male   DOB: 76 y.o.   MRN: [id number]   HPI Chief Complaint  Patient presents with   Foot Swelling    Bilateral Foot swelling and a soft tissue mass on the left hallux, rate of pain 10 out of 10, sides of the feet are also sore,     76 y.o. male presents with the above complaints. He has lymphedema. He has a nurse that comes out once a week and she will wrap them if needed. His caregiver Michael [name], who is with him today) also will wrap the legs. They are wrapped with ACE bandages.  He also states he has a lymphedema pump at home but he does not use this.  Also needs his nails trimmed today as they are thick elongated causing discomfort.  Last A1c was 6.3 on June 21, 2019   Review of Systems  All other systems reviewed and are negative.  Past Medical History:  Diagnosis Date   Arthritis    Congestive heart disease ([place]) 11/2015   COPD (chronic obstructive pulmonary disease) (HCC)    DDD (degenerative disc disease), lumbosacral    Demand ischemia ([place]) 09/23/2019   Depression    Diabetic neuropathy (HCC)    Diastolic dysfunction    Essential hypertension    GERD (gastroesophageal reflux disease)    Hyperlipidemia    Iron deficiency anemia    Left knee DJD    Lumbar spinal stenosis    Lymphedema 11/2015   BOTH LEGS   NASH (nonalcoholic steatohepatitis)    Peripheral edema    Peripheral edema 10/05/2015   Type 2 diabetes mellitus ([place])     Past Surgical History:  Procedure Laterality Date   BIOPSY N/A 07/21/2014   Procedure: BIOPSY;  Surgeon: Daneil Dolin, MD;  Location: [facility];  Service: Endoscopy;  Laterality: N/A;   CATARACT EXTRACTION W/PHACO Right 07/08/2016   Procedure: CATARACT EXTRACTION PHACO AND INTRAOCULAR LENS PLACEMENT RIGHT EYE;  Surgeon: Tonny Branch, MD;  Location: [facility];  Service: Ophthalmology;  Laterality: Right;  CDE: 9.01   CATARACT EXTRACTION W/PHACO Left 07/22/2016   Procedure: CATARACT EXTRACTION PHACO AND  INTRAOCULAR LENS PLACEMENT LEFT EYE CDE=10.69;  Surgeon: Tonny Branch, MD;  Location: [facility];  Service: Ophthalmology;  Laterality: Left;  left -    COLONOSCOPY  2011   [facility]: normal colon, normal distal ileum   COLONOSCOPY WITH PROPOFOL N/A 07/21/2014   Procedure: ATTEMPTED COLONOSCOPY WITH PROPOFOL-HAD TO STOP DUE TO BRADYCARDIA;  Surgeon: Daneil Dolin, MD;  Location: [facility];  Service: Endoscopy;  Laterality: N/A;   EGD with enteroscopy  2011   [facility]: normal esophagus and stomach. Normal duodenum, jejunum. No evidence of AVMs.    ESOPHAGOGASTRODUODENOSCOPY (EGD) WITH PROPOFOL N/A 07/21/2014   Procedure: ESOPHAGOGASTRODUODENOSCOPY (EGD) WITH PROPOFOL;  Surgeon: Daneil Dolin, MD;  Location: [facility];  Service: Endoscopy;  Laterality: N/A;   HERNIA REPAIR     KNEE ARTHROSCOPY WITH MEDIAL MENISECTOMY Left 11/06/2012   Procedure: KNEE ARTHROSCOPY WITH MEDIAL MENISECTOMY;  Surgeon: Carole Civil, MD;  Location: [facility];  Service: Orthopedics;  Laterality: Left;   Lipoma removal     Stomach   LUMBAR LAMINECTOMY/DECOMPRESSION MICRODISCECTOMY Left 08/30/2013   Procedure: LUMBAR LAMINECTOMY/DECOMPRESSION MICRODISCECTOMY LEFT  LUMBAR TWO THREE;  Surgeon: Otilio Connors, MD;  Location: [place] [facility];  Service: Neurosurgery;  Laterality: Left;   RIGHT/LEFT HEART CATH AND CORONARY ANGIOGRAPHY N/A 09/22/2019   Procedure: RIGHT/LEFT HEART CATH  AND CORONARY ANGIOGRAPHY;  Surgeon: Martinique, Peter M, MD;  Location: [place] [facility];  Service: Cardiovascular;  Laterality: N/A;   SHOULDER SURGERY     Rght-rotator cuff   TOOTH EXTRACTION       Current Outpatient Medications:    allopurinol (ZYLOPRIM) 100 MG tablet, Take 100 mg by mouth daily., Disp: , Rfl:    aspirin EC 81 MG tablet, Take 81 mg by mouth daily., Disp: , Rfl:    baclofen (LIORESAL) 10 MG tablet, Take 5 mg by mouth 3 (three) times daily as needed., Disp: , Rfl:    bumetanide (BUMEX) 2 MG tablet, Take 1.5 tablets (3 mg total) by mouth 2  (two) times daily., Disp: 270 tablet, Rfl: 3   carvedilol (COREG) 12.5 MG tablet, Take 1 tablet (12.5 mg total) by mouth 2 (two) times daily., Disp: 180 tablet, Rfl: 3   clotrimazole-betamethasone (LOTRISONE) cream, Apply 1 Application topically daily., Disp: 45 g, Rfl: 0   diclofenac sodium (VOLTAREN) 1 % GEL, Apply 4 g topically 4 (four) times daily as needed (pain)., Disp: 100 g, Rfl: 3   Ferrous Sulfate (IRON) 325 (65 FE) MG TABS, Take 1 tablet by mouth daily. , Disp: , Rfl:    gabapentin (NEURONTIN) 400 MG capsule, Take 1 capsule (400 mg total) by mouth 3 (three) times daily., Disp: 90 capsule, Rfl: 0   HYDROcodone-acetaminophen (NORCO) 10-325 MG tablet, TAKE (1) TABLET BY MOUTH (3) TIMES DAILY AS NEEDED, Disp: 90 tablet, Rfl: 0   Insulin Glargine (LANTUS SOLOSTAR) 100 UNIT/ML Solostar Pen, Inject 5 Units into the skin daily at 10 pm. (Patient taking differently: Inject 25 Units into the skin at bedtime.), Disp: 15 mL, Rfl: 0   levothyroxine (SYNTHROID) 88 MCG tablet, Take 88 mcg by mouth daily before breakfast., Disp: , Rfl:    magnesium oxide (MAG-OX) 400 MG tablet, Take 1 tablet (400 mg total) by mouth 2 (two) times daily., Disp: 60 tablet, Rfl: 11   meloxicam (MOBIC) 7.5 MG tablet, Take 1 tablet (7.5 mg total) by mouth daily., Disp: 30 tablet, Rfl: 5   metFORMIN (GLUCOPHAGE) 1000 MG tablet, TAKE ONE TABLET BY MOUTH TWICE DAILY WITH A MEAL., Disp: 180 tablet, Rfl: 3   metolazone (ZAROXOLYN) 2.5 MG tablet, Two Times Weekly on Monday and Thursday, Disp: 10 tablet, Rfl: 11   nystatin cream (MYCOSTATIN), APPLY TO AFFECTED AREA TWICE DAILY., Disp: 30 g, Rfl: 0   pantoprazole (PROTONIX) 40 MG tablet, Take 1 tablet (40 mg total) by mouth daily., Disp: 30 tablet, Rfl: 11   potassium chloride (K-DUR) 10 MEQ tablet, Take 1 tablet (10 mEq total) by mouth 2 (two) times daily., Disp: 180 tablet, Rfl: 3   pravastatin (PRAVACHOL) 40 MG tablet, TAKE ONE TABLET BY MOUTH DAILY., Disp: 90 tablet, Rfl: 0    sacubitril-valsartan (ENTRESTO) 49-51 MG, Take 1 tablet by mouth 2 (two) times daily., Disp: 60 tablet, Rfl: 11   tamsulosin (FLOMAX) 0.4 MG CAPS capsule, TAKE (1) CAPSULE BY MOUTH EVERY DAY., Disp: 90 capsule, Rfl: 3   terbinafine (LAMISIL) 250 MG tablet, Take 1 tablet (250 mg total) by mouth daily., Disp: 30 tablet, Rfl: 2   tiZANidine (ZANAFLEX) 2 MG tablet, Take 1 tablet (2 mg total) by mouth every 8 (eight) hours as needed for muscle spasms., Disp: 30 tablet, Rfl: 0   traZODone (DESYREL) 100 MG tablet, TAKE 1 TABLET BY MOUTH AT BEDTIME AS NEEDED FOR SLEEP., Disp: 90 tablet, Rfl: 2   Vitamin D, Ergocalciferol, (DRISDOL) 1.25 MG (  50000 UNIT) CAPS capsule, Take 1 capsule (50,000 Units total) by mouth every 7 (seven) days., Disp: 12 capsule, Rfl: 1   XTAMPZA ER 9 MG C12A, Take 1 capsule by mouth 2 (two) times daily., Disp: , Rfl:   No Known Allergies        Objective:  Physical Exam  General: AAO x3, NAD  Dermatological: Nails are hypertrophic, dystrophic, brittle, discolored, elongated 10. No surrounding redness or drainage. Tenderness nails 1-5 bilaterally. No open lesions or pre-ulcerative lesions are identified today.  Significant lymphedema changes present left side worse than the right.  There is no blister formation or open lesions today.  No erythema or signs of infection.  Vascular: Dorsalis Pedis artery and Posterior Tibial artery pedal pulses are decreased likely due to the swelling bilateral with immedate capillary fill time.There is no pain with calf compression, swelling, warmth, erythema.   Neruologic: Sensation decreased with Semmes Weinstein monofilament  Musculoskeletal: No gross boney pedal deformities bilateral. No pain, crepitus, or limitation noted with foot and ankle range of motion bilateral. Muscular strength 5/5 in all groups tested bilateral.      Assessment:   Symptomatic onychomycosis, lymphedema     Plan:  -Treatment options discussed including all  alternatives, risks, and complications -Etiology of symptoms were discussed -Discussed lymphedema.  I offered a referral to the lymphedema clinic.  After we discussed this he wants to hold off on this and can start trying to use the lymphedema pumps that he has at home.  His caregiver states that she will help him with this.  If no improvement will refer.  Discussed daily foot inspection.  Monitor for any skin breakdown or ulcerations or infection. -Sharply debrided the nails x10 without any complications or bleeding. -Discussed glucose control.  Trula Slade DPM

## 2022-07-12 ENCOUNTER — Other Ambulatory Visit: Payer: Self-pay | Admitting: Internal Medicine

## 2022-07-12 DIAGNOSIS — G894 Chronic pain syndrome: Secondary | ICD-10-CM

## 2022-07-18 DIAGNOSIS — R410 Disorientation, unspecified: Secondary | ICD-10-CM | POA: Diagnosis not present

## 2022-07-18 DIAGNOSIS — E161 Other hypoglycemia: Secondary | ICD-10-CM | POA: Diagnosis not present

## 2022-07-18 DIAGNOSIS — I10 Essential (primary) hypertension: Secondary | ICD-10-CM | POA: Diagnosis not present

## 2022-07-18 DIAGNOSIS — E162 Hypoglycemia, unspecified: Secondary | ICD-10-CM | POA: Diagnosis not present

## 2022-08-12 ENCOUNTER — Other Ambulatory Visit: Payer: Self-pay | Admitting: Orthopedic Surgery

## 2022-08-12 ENCOUNTER — Other Ambulatory Visit: Payer: Self-pay | Admitting: Internal Medicine

## 2022-08-12 DIAGNOSIS — G894 Chronic pain syndrome: Secondary | ICD-10-CM

## 2022-08-12 DIAGNOSIS — M47812 Spondylosis without myelopathy or radiculopathy, cervical region: Secondary | ICD-10-CM

## 2022-08-12 MED ORDER — HYDROCODONE-ACETAMINOPHEN 10-325 MG PO TABS: ORAL_TABLET | ORAL | 0 refills | Status: DC

## 2022-08-12 NOTE — Telephone Encounter (Signed)
Please advise as Dr.Patel is out of town

## 2022-08-20 ENCOUNTER — Other Ambulatory Visit: Payer: Self-pay | Admitting: Internal Medicine

## 2022-08-20 DIAGNOSIS — B351 Tinea unguium: Secondary | ICD-10-CM

## 2022-08-27 DIAGNOSIS — W19XXXA Unspecified fall, initial encounter: Secondary | ICD-10-CM | POA: Diagnosis not present

## 2022-08-27 DIAGNOSIS — T148XXA Other injury of unspecified body region, initial encounter: Secondary | ICD-10-CM | POA: Diagnosis not present

## 2022-09-10 ENCOUNTER — Telehealth: Payer: Self-pay | Admitting: Internal Medicine

## 2022-09-10 ENCOUNTER — Other Ambulatory Visit: Payer: Self-pay | Admitting: Internal Medicine

## 2022-09-10 DIAGNOSIS — R197 Diarrhea, unspecified: Secondary | ICD-10-CM

## 2022-09-10 DIAGNOSIS — G894 Chronic pain syndrome: Secondary | ICD-10-CM

## 2022-09-10 MED ORDER — HYDROCODONE-ACETAMINOPHEN 10-325 MG PO TABS: 1.0000 | ORAL_TABLET | Freq: Three times a day (TID) | ORAL | 0 refills | Status: DC | PRN

## 2022-09-10 NOTE — Addendum Note (Signed)
Addended byIhor Dow on: 09/10/2022 09:10 AM   Modules accepted: Orders

## 2022-09-10 NOTE — Telephone Encounter (Signed)
Michael [name] from [place] advised

## 2022-09-10 NOTE — Telephone Encounter (Signed)
Elmyra Ricks, nurse with [facility], [phone]  Called stating patient diarrhea is getting worse day by day and is needing a refill on ANTI-DIARRHEA. Also needs a refill on  HYDROcodone-acetaminophen (NORCO) 10-325 MG tablet . States pt realizes he is not going to be able to care for hisself much longer at home. States he had a fall on 08/27/22 and laid there about 5 hours before he was found the next morning by his nurse. States he was laying in his own feces when found. He has an appt on 09/25/22 @ 10:20am. He is going to discuss then his concerns and possible FL2 to get into a nursing home.   Can you please refill ANTI-DIARRHEA and HYDROcodone-acetaminophen (NORCO) 10-325 MG tablet?    [employer]

## 2022-09-13 ENCOUNTER — Telehealth: Payer: Self-pay | Admitting: Internal Medicine

## 2022-09-13 NOTE — Telephone Encounter (Signed)
Amy  Physical therapist w. [place] called in for verbal order approving treatment plan sent in by provider.   Call back [phone]

## 2022-09-14 ENCOUNTER — Emergency Department (HOSPITAL_COMMUNITY)
Admission: EM | Admit: 2022-09-14 | Discharge: 2022-09-14 | Disposition: A | Discharge status: Home / Self Care | Payer: Medicare Other | Attending: Emergency Medicine | Admitting: Emergency Medicine

## 2022-09-14 ENCOUNTER — Encounter (HOSPITAL_COMMUNITY): Payer: Self-pay | Admitting: *Deleted

## 2022-09-14 ENCOUNTER — Other Ambulatory Visit: Payer: Self-pay

## 2022-09-14 ENCOUNTER — Emergency Department (HOSPITAL_COMMUNITY): Payer: Medicare Other

## 2022-09-14 DIAGNOSIS — M549 Dorsalgia, unspecified: Secondary | ICD-10-CM | POA: Diagnosis not present

## 2022-09-14 DIAGNOSIS — S199XXA Unspecified injury of neck, initial encounter: Secondary | ICD-10-CM | POA: Diagnosis not present

## 2022-09-14 DIAGNOSIS — W19XXXA Unspecified fall, initial encounter: Secondary | ICD-10-CM

## 2022-09-14 DIAGNOSIS — R6889 Other general symptoms and signs: Secondary | ICD-10-CM | POA: Diagnosis not present

## 2022-09-14 DIAGNOSIS — S22019A Unspecified fracture of first thoracic vertebra, initial encounter for closed fracture: Secondary | ICD-10-CM | POA: Diagnosis not present

## 2022-09-14 DIAGNOSIS — S29002A Unspecified injury of muscle and tendon of back wall of thorax, initial encounter: Secondary | ICD-10-CM | POA: Diagnosis present

## 2022-09-14 DIAGNOSIS — I672 Cerebral atherosclerosis: Secondary | ICD-10-CM | POA: Diagnosis not present

## 2022-09-14 DIAGNOSIS — Z794 Long term (current) use of insulin: Secondary | ICD-10-CM | POA: Diagnosis not present

## 2022-09-14 DIAGNOSIS — S32010A Wedge compression fracture of first lumbar vertebra, initial encounter for closed fracture: Secondary | ICD-10-CM | POA: Diagnosis not present

## 2022-09-14 DIAGNOSIS — I6523 Occlusion and stenosis of bilateral carotid arteries: Secondary | ICD-10-CM | POA: Diagnosis not present

## 2022-09-14 DIAGNOSIS — S22010A Wedge compression fracture of first thoracic vertebra, initial encounter for closed fracture: Secondary | ICD-10-CM

## 2022-09-14 DIAGNOSIS — Z7982 Long term (current) use of aspirin: Secondary | ICD-10-CM | POA: Insufficient documentation

## 2022-09-14 DIAGNOSIS — M542 Cervicalgia: Secondary | ICD-10-CM | POA: Insufficient documentation

## 2022-09-14 DIAGNOSIS — S0990XA Unspecified injury of head, initial encounter: Secondary | ICD-10-CM | POA: Diagnosis not present

## 2022-09-14 DIAGNOSIS — S0081XA Abrasion of other part of head, initial encounter: Secondary | ICD-10-CM | POA: Diagnosis not present

## 2022-09-14 DIAGNOSIS — M47812 Spondylosis without myelopathy or radiculopathy, cervical region: Secondary | ICD-10-CM | POA: Diagnosis not present

## 2022-09-14 DIAGNOSIS — M545 Low back pain, unspecified: Secondary | ICD-10-CM | POA: Diagnosis not present

## 2022-09-14 DIAGNOSIS — Z743 Need for continuous supervision: Secondary | ICD-10-CM | POA: Diagnosis not present

## 2022-09-14 DIAGNOSIS — W050XXA Fall from non-moving wheelchair, initial encounter: Secondary | ICD-10-CM | POA: Diagnosis not present

## 2022-09-14 DIAGNOSIS — I499 Cardiac arrhythmia, unspecified: Secondary | ICD-10-CM | POA: Diagnosis not present

## 2022-09-14 DIAGNOSIS — E860 Dehydration: Secondary | ICD-10-CM | POA: Diagnosis not present

## 2022-09-14 DIAGNOSIS — I10 Essential (primary) hypertension: Secondary | ICD-10-CM | POA: Diagnosis not present

## 2022-09-14 LAB — COMPREHENSIVE METABOLIC PANEL
ALT: 11 U/L (ref 0–44)
AST: 19 U/L (ref 15–41)
Albumin: 3.4 g/dL — ABNORMAL LOW (ref 3.5–5.0)
Alkaline Phosphatase: 72 U/L (ref 38–126)
Anion gap: 8 (ref 5–15)
BUN: 8 mg/dL (ref 8–23)
CO2: 30 mmol/L (ref 22–32)
Calcium: 8.5 mg/dL — ABNORMAL LOW (ref 8.9–10.3)
Chloride: 100 mmol/L (ref 98–111)
Creatinine, Ser: 0.63 mg/dL (ref 0.61–1.24)
GFR, Estimated: >60 mL/min (ref >60)
Glucose, Bld: 62 mg/dL — ABNORMAL LOW (ref 70–99)
Potassium: 3.7 mmol/L (ref 3.5–5.1)
Sodium: 138 mmol/L (ref 135–145)
Total Bilirubin: 0.6 mg/dL (ref 0.3–1.2)
Total Protein: 6.3 g/dL — ABNORMAL LOW (ref 6.5–8.1)

## 2022-09-14 LAB — CBC WITH DIFFERENTIAL/PLATELET
Abs Immature Granulocytes: 0.01 10*3/uL (ref 0.00–0.07)
Basophils Absolute: 0 10*3/uL (ref 0.0–0.1)
Basophils Relative: 1 %
Eosinophils Absolute: 0 10*3/uL (ref 0.0–0.5)
Eosinophils Relative: 0 %
HCT: 40.6 % (ref 39.0–52.0)
Hemoglobin: 13.5 g/dL (ref 13.0–17.0)
Immature Granulocytes: 0 %
Lymphocytes Relative: 16 %
Lymphs Abs: 0.8 10*3/uL (ref 0.7–4.0)
MCH: 27.2 pg (ref 26.0–34.0)
MCHC: 33.3 g/dL (ref 30.0–36.0)
MCV: 81.9 fL (ref 80.0–100.0)
Monocytes Absolute: 0.4 10*3/uL (ref 0.1–1.0)
Monocytes Relative: 8 %
Neutro Abs: 3.8 10*3/uL (ref 1.7–7.7)
Neutrophils Relative %: 75 %
Platelets: 265 10*3/uL (ref 150–400)
RBC: 4.96 MIL/uL (ref 4.22–5.81)
RDW: 15.3 % (ref 11.5–15.5)
WBC: 5 10*3/uL (ref 4.0–10.5)
nRBC: 0 % (ref 0.0–0.2)

## 2022-09-14 LAB — URINALYSIS, ROUTINE W REFLEX MICROSCOPIC
Bilirubin Urine: NEGATIVE
Glucose, UA: NEGATIVE mg/dL
Ketones, ur: 5 mg/dL — AB
Nitrite: NEGATIVE
Protein, ur: 100 mg/dL — AB
Specific Gravity, Urine: 1.01 (ref 1.005–1.030)
pH: 6 (ref 5.0–8.0)

## 2022-09-14 LAB — CK: Total CK: 247 U/L (ref 49–397)

## 2022-09-14 MED ORDER — ZINC OXIDE 40 % EX OINT
TOPICAL_OINTMENT | Freq: Once | CUTANEOUS | Status: AC
  Filled 2022-09-14: qty 57

## 2022-09-14 MED ORDER — HYDROMORPHONE HCL 1 MG/ML IJ SOLN
1.0000 mg | Freq: Once | INTRAMUSCULAR | Status: AC
  Administered 2022-09-14: 1 mg via INTRAMUSCULAR
  Filled 2022-09-14: qty 1

## 2022-09-14 NOTE — Discharge Instructions (Addendum)
Continue current medications.  See your Physician for recheck.

## 2022-09-14 NOTE — ED Notes (Signed)
Patient transported to CT 

## 2022-09-14 NOTE — ED Notes (Signed)
Pt placed on convo waiting list to be returned home by EMS

## 2022-09-14 NOTE — ED Notes (Signed)
ED Provider at bedside. 

## 2022-09-14 NOTE — ED Notes (Signed)
C-Collar removed by pt. Pt cleaned after BM, new linens and gown placed on pt.

## 2022-09-14 NOTE — ED Provider Notes (Signed)
[facility] EMERGENCY DEPARTMENT Provider Note   CSN: [id number] Arrival date & time: 09/14/22  1303     History  Chief Complaint  Patient presents with   Lytle Michaels    Michael [name] is a 76 y.o. male.  Patient reports he was in his wheelchair trying to clean his recliner and he slid too far forward causing him to fall headfirst out of the chair.  Patient reports he did strike his neck.  Patient reports he hit his head.  Patient complains of some soreness in his neck and his back.  Patient does not think that he lost consciousness.  Patient denies any pain to his arms he denies any injury to his legs.  Patient has chronic leg pain and chronic lymphedema.  Patient reports that his medical alert device did not work he reports that he had to sit on the floor for about 4 hours until his neighbor came to check on him.  Patient reports overall he does well in his home he has a care provider who comes in 4 hours a day and several neighbors and church members who check on him.  The history is provided by the patient. No language interpreter was used.  Fall This is a new problem. The current episode started 6 to 12 hours ago. Associated symptoms include headaches. Pertinent negatives include no chest pain and no abdominal pain. Nothing aggravates the symptoms. Nothing relieves the symptoms. He has tried nothing for the symptoms. The treatment provided no relief.       Home Medications Prior to Admission medications   Medication Sig Start Date End Date Taking? Authorizing Provider  allopurinol (ZYLOPRIM) 100 MG tablet Take 100 mg by mouth daily.   Yes [provider]  aspirin EC 81 MG tablet Take 81 mg by mouth daily.   Yes [provider]  baclofen (LIORESAL) 10 MG tablet Take 5 mg by mouth 3 (three) times daily as needed. 12/26/20  Yes [provider]  bumetanide (BUMEX) 2 MG tablet Take 1.5 tablets (3 mg total) by mouth 2 (two) times daily. 11/01/19  Yes Branch, Alphonse Guild, MD  carvedilol (COREG) 12.5 MG tablet Take 1 tablet (12.5 mg total) by mouth 2 (two) times daily. 12/14/19  Yes Branch, Alphonse Guild, MD  clotrimazole-betamethasone (LOTRISONE) cream Apply 1 Application topically daily. 06/20/22  Yes Lindell Spar, MD  diclofenac sodium (VOLTAREN) 1 % GEL Apply 4 g topically 4 (four) times daily as needed (pain). 03/06/17  Yes [place], Modena Nunnery, MD  Ferrous Sulfate (IRON) 325 (65 FE) MG TABS Take 1 tablet by mouth 2 (two) times daily.   Yes [provider]  gabapentin (NEURONTIN) 400 MG capsule Take 1 capsule (400 mg total) by mouth 3 (three) times daily. 03/04/18  Yes [place], Modena Nunnery, MD  HYDROcodone-acetaminophen [facility]) 10-325 MG tablet Take 1 tablet by mouth 3 (three) times daily as needed for severe pain. 09/10/22  Yes Lindell Spar, MD  Insulin Glargine (LANTUS SOLOSTAR) 100 UNIT/ML Solostar Pen Inject 5 Units into the skin daily at 10 pm. Patient taking differently: Inject 25 Units into the skin at bedtime. 03/06/18  Yes [place], Modena Nunnery, MD  levothyroxine (SYNTHROID) 88 MCG tablet Take 88 mcg by mouth daily before breakfast.   Yes [provider]  magnesium oxide (MAG-OX) 400 MG tablet Take 1 tablet (400 mg total) by mouth 2 (two) times daily. 11/19/17  Yes Imogene Burn, PA-C  metFORMIN (GLUCOPHAGE) 1000 MG tablet TAKE ONE  TABLET BY MOUTH TWICE DAILY WITH A MEAL. 09/24/19  Yes Sande Rives E, PA-C  metolazone (ZAROXOLYN) 2.5 MG tablet Two Times Weekly on Monday and Thursday 02/08/20  Yes Branch, Alphonse Guild, MD  nystatin cream (MYCOSTATIN) APPLY TO AFFECTED AREA TWICE DAILY. 03/04/18  Yes [place], Modena Nunnery, MD  pantoprazole (PROTONIX) 40 MG tablet Take 1 tablet (40 mg total) by mouth daily. 02/08/20  Yes Branch, Alphonse Guild, MD  pravastatin (PRAVACHOL) 40 MG tablet TAKE ONE TABLET BY MOUTH DAILY. 05/21/16  Yes [place], Modena Nunnery, MD  sacubitril-valsartan (ENTRESTO) 49-51 MG Take 1 tablet by mouth 2 (two) times daily. 02/08/20  Yes Branch,  Alphonse Guild, MD  tamsulosin (FLOMAX) 0.4 MG CAPS capsule TAKE (1) CAPSULE BY MOUTH EVERY DAY. Patient taking differently: Take 0.4 mg by mouth daily. 10/10/21  Yes Lindell Spar, MD  terbinafine (LAMISIL) 250 MG tablet TAKE ONE TABLET BY MOUTH ONCE DAILY. 08/20/22  Yes Lindell Spar, MD  traZODone (DESYREL) 100 MG tablet TAKE 1 TABLET BY MOUTH AT BEDTIME AS NEEDED FOR SLEEP. 11/18/18  Yes , Modena Nunnery, MD  meloxicam (MOBIC) 7.5 MG tablet TAKE ONE TABLET BY MOUTH ONCE DAILY. 08/12/22   Carole Civil, MD      Allergies    Patient has no known allergies.    Review of Systems   Review of Systems  Cardiovascular:  Negative for chest pain.  Gastrointestinal:  Negative for abdominal pain.  Neurological:  Positive for headaches.  All other systems reviewed and are negative.   Physical Exam Updated Vital Signs BP (!) 175/83   Pulse 81   Temp 98.4 F (36.9 C) (Oral)   Resp 14   Ht 4' 11"  (1.499 m)   Wt 92.1 kg   SpO2 91%   BMI 41.00 kg/m  Physical Exam Vitals and nursing note reviewed.  Constitutional:      Appearance: He is well-developed.  HENT:     Head: Normocephalic.     Comments: Abrasion mid forehead superficial    Nose: Nose normal.     Mouth/Throat:     Mouth: Mucous membranes are moist.  Eyes:     Pupils: Pupils are equal, round, and reactive to light.  Cardiovascular:     Rate and Rhythm: Normal rate.  Pulmonary:     Effort: Pulmonary effort is normal.  Abdominal:     General: There is no distension.  Musculoskeletal:        General: Tenderness present. No deformity.     Cervical back: Normal range of motion.     Comments: Tender lower cervical spine full thoracic spine and upper lumbar spine  Neurological:     Mental Status: He is alert and oriented to person, place, and time.  Psychiatric:        Mood and Affect: Mood normal.     ED Results / Procedures / Treatments   Labs (all labs ordered are listed, but only abnormal results are  displayed) Labs Reviewed  COMPREHENSIVE METABOLIC PANEL - Abnormal; Notable for the following components:      Result Value   Glucose, Bld 62 (*)    Calcium 8.5 (*)    Total Protein 6.3 (*)    Albumin 3.4 (*)    All other components within normal limits  URINALYSIS, ROUTINE W REFLEX MICROSCOPIC - Abnormal; Notable for the following components:   Hgb urine dipstick SMALL (*)    Ketones, ur 5 (*)    Protein, ur 100 (*)  Leukocytes,Ua MODERATE (*)    Bacteria, UA RARE (*)    All other components within normal limits  CBC WITH DIFFERENTIAL/PLATELET  CK    EKG EKG Interpretation  Date/Time:  Saturday September 14 2022 15:37:06 EST Ventricular Rate:  92 PR Interval:  125 QRS Duration: 93 QT Interval:  386 QTC Calculation: 478 R Axis:   23 Text Interpretation: Sinus rhythm Multiple premature complexes, vent & supraven Anterior infarct, old Borderline repolarization abnormality PVCs and PACs are new Confirmed by Isla Pence [phone] on 09/14/2022 4:04:25 PM  Radiology CT Lumbar Spine Wo Contrast  Result Date: 09/14/2022 CLINICAL DATA:  Pain after trauma EXAM: CT LUMBAR SPINE WITHOUT CONTRAST TECHNIQUE: Multidetector CT imaging of the lumbar spine was performed without intravenous contrast administration. Multiplanar CT image reconstructions were also generated. RADIATION DOSE REDUCTION: This exam was performed according to the departmental dose-optimization program which includes automated exposure control, adjustment of the mA and/or kV according to patient size and/or use of iterative reconstruction technique. COMPARISON:  None Available. FINDINGS: Segmentation: 5 lumbar type vertebrae. Alignment: Normal. Vertebrae: No acute fracture or focal pathologic process. Paraspinal and other soft tissues: Negative. Disc levels: Multilevel degenerative disc disease and lower lumbar facet degenerative changes. IMPRESSION: 1. No acute fracture or traumatic malalignment. 2. Multilevel  degenerative disc disease and lower lumbar facet degenerative changes. Electronically Signed   By: Dorise Bullion III M.D.   On: 09/14/2022 16:31   CT Thoracic Spine Wo Contrast  Result Date: 09/14/2022 CLINICAL DATA:  Pain after trauma EXAM: CT THORACIC SPINE WITHOUT CONTRAST TECHNIQUE: Multidetector CT images of the thoracic were obtained using the standard protocol without intravenous contrast. RADIATION DOSE REDUCTION: This exam was performed according to the departmental dose-optimization program which includes automated exposure control, adjustment of the mA and/or kV according to patient size and/or use of iterative reconstruction technique. COMPARISON:  None Available. FINDINGS: Alignment: Normal. Vertebrae: There is a fracture off the anterior superior endplate of T1 described on the cervical spine CT scan. No evidence of involvement of the posterior vertebral body or posterior elements. No other fractures are identified. Paraspinal and other soft tissues: Negative. Disc levels: Multilevel degenerative disc disease and facet degenerative changes. IMPRESSION: 1. There is a fracture off the anterior superior endplate of T1 described on the cervical spine CT scan. No evidence of involvement of the posterior vertebral body or posterior elements. This finding was already called to the patient's provider in the emergency room. 2. Multilevel degenerative disc disease and facet degenerative changes. Electronically Signed   By: Dorise Bullion III M.D.   On: 09/14/2022 16:19   CT Head Wo Contrast  Result Date: 09/14/2022 CLINICAL DATA:  Trauma.  Fall. EXAM: CT HEAD WITHOUT CONTRAST CT CERVICAL SPINE WITHOUT CONTRAST TECHNIQUE: Multidetector CT imaging of the head and cervical spine was performed following the standard protocol without intravenous contrast. Multiplanar CT image reconstructions of the cervical spine were also generated. RADIATION DOSE REDUCTION: This exam was performed according to the  departmental dose-optimization program which includes automated exposure control, adjustment of the mA and/or kV according to patient size and/or use of iterative reconstruction technique. COMPARISON:  CT scan of the brain April 11, 2020. Cervical spine x-rays July 19, 2021. FINDINGS: CT HEAD FINDINGS Brain: No evidence of acute infarction, hemorrhage, hydrocephalus, extra-axial collection or mass lesion/mass effect. Vascular: Calcified atherosclerotic changes are identified in the intracranial carotids. Skull: Normal. Negative for fracture or focal lesion. Sinuses/Orbits: Opacification of the inferior right maxillary sinus is identified. Mucosal thickening  is identified in the left maxillary sinus. No air-fluid levels. Other: No other abnormalities. CT CERVICAL SPINE FINDINGS Alignment: There is anterolisthesis of C4 versus C5. This finding is stable since the scout film from the April 11, 2020 CT scan of the brain. No acute malalignment is identified. Skull base and vertebrae: There is anterior wedging the T1 vertebral body with a lucency through the anterior superior endplate only well identified on the sagittal view. The remainder of the T1 vertebral body is intact no other evidence of fractures identified. Soft tissues and spinal canal: No prevertebral fluid or swelling. No visible canal hematoma. Disc levels: Multilevel degenerative disc disease with anterior and posterior osteophytes. Uncovertebral degenerative changes, particularly in the upper cervical spine. Upper chest: Negative. Other: No other abnormalities. IMPRESSION: 1. No acute intracranial abnormalities. 2. There is anterior wedging of the T1 vertebral body with a lucency through the anterior superior endplate only well identified on the sagittal view. This is favored to represent an acute fracture through the anterior superior endplate of T1. The remainder of the T1 vertebral body maintains height and there is no evident posterior extension.  Posterior elements do not appear to be involved. 3. No acute fracture or traumatic malalignment in the cervical spine. 4. Degenerative changes in the cervical spine. Findings were discussed with the patient's PA, Santiago Glad. Electronically Signed   By: Dorise Bullion III M.D.   On: 09/14/2022 16:09   CT Cervical Spine Wo Contrast  Result Date: 09/14/2022 CLINICAL DATA:  Trauma.  Fall. EXAM: CT HEAD WITHOUT CONTRAST CT CERVICAL SPINE WITHOUT CONTRAST TECHNIQUE: Multidetector CT imaging of the head and cervical spine was performed following the standard protocol without intravenous contrast. Multiplanar CT image reconstructions of the cervical spine were also generated. RADIATION DOSE REDUCTION: This exam was performed according to the departmental dose-optimization program which includes automated exposure control, adjustment of the mA and/or kV according to patient size and/or use of iterative reconstruction technique. COMPARISON:  CT scan of the brain April 11, 2020. Cervical spine x-rays July 19, 2021. FINDINGS: CT HEAD FINDINGS Brain: No evidence of acute infarction, hemorrhage, hydrocephalus, extra-axial collection or mass lesion/mass effect. Vascular: Calcified atherosclerotic changes are identified in the intracranial carotids. Skull: Normal. Negative for fracture or focal lesion. Sinuses/Orbits: Opacification of the inferior right maxillary sinus is identified. Mucosal thickening is identified in the left maxillary sinus. No air-fluid levels. Other: No other abnormalities. CT CERVICAL SPINE FINDINGS Alignment: There is anterolisthesis of C4 versus C5. This finding is stable since the scout film from the April 11, 2020 CT scan of the brain. No acute malalignment is identified. Skull base and vertebrae: There is anterior wedging the T1 vertebral body with a lucency through the anterior superior endplate only well identified on the sagittal view. The remainder of the T1 vertebral body is intact no other  evidence of fractures identified. Soft tissues and spinal canal: No prevertebral fluid or swelling. No visible canal hematoma. Disc levels: Multilevel degenerative disc disease with anterior and posterior osteophytes. Uncovertebral degenerative changes, particularly in the upper cervical spine. Upper chest: Negative. Other: No other abnormalities. IMPRESSION: 1. No acute intracranial abnormalities. 2. There is anterior wedging of the T1 vertebral body with a lucency through the anterior superior endplate only well identified on the sagittal view. This is favored to represent an acute fracture through the anterior superior endplate of T1. The remainder of the T1 vertebral body maintains height and there is no evident posterior extension. Posterior elements do not appear to  be involved. 3. No acute fracture or traumatic malalignment in the cervical spine. 4. Degenerative changes in the cervical spine. Findings were discussed with the patient's PA, Santiago Glad. Electronically Signed   By: Dorise Bullion III M.D.   On: 09/14/2022 16:09    Procedures Procedures    Medications Ordered in ED Medications  liver oil-zinc oxide (DESITIN) 40 % ointment ( Topical Given 09/14/22 1457)  HYDROmorphone (DILAUDID) injection 1 mg (1 mg Intramuscular Given 09/14/22 1715)    ED Course/ Medical Decision Making/ A&P                           Medical Decision Making Patient reports falling out of his wheelchair and hitting his head he complains of pain in his back and multiple areas  Amount and/or Complexity of Data Reviewed Labs: ordered. Decision-making details documented in ED Course.    Details: Labs ordered reviewed and interpreted.  Glucose is 62.  Patient's urinalysis shows moderate leukocytes.  When I reviewed patient's labs he has chronic UTIs.  Is currently asymptomatic Radiology: ordered and independent interpretation performed. Decision-making details documented in ED Course.    Details: ET head C-spine  T-spine and LS-spine are obtained.  CT of cervical and thoracic spine show a T1 wedge deformity with no foraminal encroachment.  Risk OTC drugs. Prescription drug management. Risk Details: On reevaluation patient has full range of motion of his upper extremities he denies any numbness or tingling in his neck or his shoulders.  I counseled the patient on results he is already on chronic pain medicine at home.  Patient is advised to follow-up with his primary care physician for recheck.  Reports he has family that we will check on him today and tomorrow.  He is able to get someone to evaluate his medical alert monitor on Monday.  Patient is wanting to go home he reports he is having people from his church come to visit this evening.           Final Clinical Impression(s) / ED Diagnoses Final diagnoses:  Fall, initial encounter  Closed wedge compression fracture of T1 vertebra, initial encounter ([place])  Abrasion of forehead, initial encounter    Rx / DC Orders ED Discharge Orders     None     An After Visit Summary was printed and given to the patient.     Sidney Ace 09/14/22 1906    Carmin Muskrat, MD 09/14/22 2250

## 2022-09-14 NOTE — ED Triage Notes (Signed)
Pt brought in by [facility] from home with c/o fall this morning around 0500. Pt was found by his neighbor. Pt c/o neck and back pain, pt was placed in c-collar by EMS. Denies use of blood thinners. CBG 90. Denies LOC. HR 95, BP 128/68, O2 sat 99% on RA for EMS.

## 2022-09-16 NOTE — Telephone Encounter (Signed)
Left message for Michael [name]

## 2022-09-17 DIAGNOSIS — E161 Other hypoglycemia: Secondary | ICD-10-CM | POA: Diagnosis not present

## 2022-09-17 DIAGNOSIS — E162 Hypoglycemia, unspecified: Secondary | ICD-10-CM | POA: Diagnosis not present

## 2022-09-17 DIAGNOSIS — R4781 Slurred speech: Secondary | ICD-10-CM | POA: Diagnosis not present

## 2022-09-17 DIAGNOSIS — I10 Essential (primary) hypertension: Secondary | ICD-10-CM | POA: Diagnosis not present

## 2022-09-17 DIAGNOSIS — R404 Transient alteration of awareness: Secondary | ICD-10-CM | POA: Diagnosis not present

## 2022-09-18 ENCOUNTER — Encounter (HOSPITAL_COMMUNITY): Payer: Self-pay | Admitting: *Deleted

## 2022-09-18 ENCOUNTER — Inpatient Hospital Stay (HOSPITAL_COMMUNITY)
Admission: EM | Admit: 2022-09-18 | Discharge: 2022-10-01 | DRG: 305 | Disposition: A | Discharge status: Skilled Nursing Facility | Payer: Medicare Other | Attending: Student | Admitting: Student

## 2022-09-18 ENCOUNTER — Emergency Department (HOSPITAL_COMMUNITY): Payer: Medicare Other

## 2022-09-18 ENCOUNTER — Other Ambulatory Visit: Payer: Self-pay

## 2022-09-18 DIAGNOSIS — Z7984 Long term (current) use of oral hypoglycemic drugs: Secondary | ICD-10-CM

## 2022-09-18 DIAGNOSIS — R410 Disorientation, unspecified: Secondary | ICD-10-CM | POA: Diagnosis not present

## 2022-09-18 DIAGNOSIS — I5042 Chronic combined systolic (congestive) and diastolic (congestive) heart failure: Secondary | ICD-10-CM | POA: Diagnosis present

## 2022-09-18 DIAGNOSIS — R5381 Other malaise: Secondary | ICD-10-CM | POA: Diagnosis present

## 2022-09-18 DIAGNOSIS — Z791 Long term (current) use of non-steroidal anti-inflammatories (NSAID): Secondary | ICD-10-CM

## 2022-09-18 DIAGNOSIS — E876 Hypokalemia: Secondary | ICD-10-CM | POA: Diagnosis not present

## 2022-09-18 DIAGNOSIS — R4701 Aphasia: Secondary | ICD-10-CM | POA: Diagnosis not present

## 2022-09-18 DIAGNOSIS — G9341 Metabolic encephalopathy: Secondary | ICD-10-CM | POA: Diagnosis not present

## 2022-09-18 DIAGNOSIS — E8809 Other disorders of plasma-protein metabolism, not elsewhere classified: Secondary | ICD-10-CM | POA: Diagnosis not present

## 2022-09-18 DIAGNOSIS — Z83438 Family history of other disorder of lipoprotein metabolism and other lipidemia: Secondary | ICD-10-CM

## 2022-09-18 DIAGNOSIS — E1165 Type 2 diabetes mellitus with hyperglycemia: Secondary | ICD-10-CM | POA: Diagnosis not present

## 2022-09-18 DIAGNOSIS — L8990 Pressure ulcer of unspecified site, unspecified stage: Secondary | ICD-10-CM | POA: Insufficient documentation

## 2022-09-18 DIAGNOSIS — G9340 Encephalopathy, unspecified: Secondary | ICD-10-CM | POA: Diagnosis not present

## 2022-09-18 DIAGNOSIS — R531 Weakness: Secondary | ICD-10-CM | POA: Diagnosis not present

## 2022-09-18 DIAGNOSIS — I169 Hypertensive crisis, unspecified: Secondary | ICD-10-CM | POA: Diagnosis present

## 2022-09-18 DIAGNOSIS — Z7982 Long term (current) use of aspirin: Secondary | ICD-10-CM

## 2022-09-18 DIAGNOSIS — I2510 Atherosclerotic heart disease of native coronary artery without angina pectoris: Secondary | ICD-10-CM | POA: Diagnosis present

## 2022-09-18 DIAGNOSIS — J9811 Atelectasis: Secondary | ICD-10-CM | POA: Diagnosis not present

## 2022-09-18 DIAGNOSIS — F32A Depression, unspecified: Secondary | ICD-10-CM | POA: Diagnosis present

## 2022-09-18 DIAGNOSIS — E1140 Type 2 diabetes mellitus with diabetic neuropathy, unspecified: Secondary | ICD-10-CM | POA: Diagnosis present

## 2022-09-18 DIAGNOSIS — D509 Iron deficiency anemia, unspecified: Secondary | ICD-10-CM | POA: Diagnosis not present

## 2022-09-18 DIAGNOSIS — Z800 Family history of malignant neoplasm of digestive organs: Secondary | ICD-10-CM

## 2022-09-18 DIAGNOSIS — R404 Transient alteration of awareness: Secondary | ICD-10-CM | POA: Diagnosis not present

## 2022-09-18 DIAGNOSIS — Z794 Long term (current) use of insulin: Secondary | ICD-10-CM

## 2022-09-18 DIAGNOSIS — K219 Gastro-esophageal reflux disease without esophagitis: Secondary | ICD-10-CM | POA: Diagnosis present

## 2022-09-18 DIAGNOSIS — E119 Type 2 diabetes mellitus without complications: Secondary | ICD-10-CM

## 2022-09-18 DIAGNOSIS — Z87891 Personal history of nicotine dependence: Secondary | ICD-10-CM

## 2022-09-18 DIAGNOSIS — E538 Deficiency of other specified B group vitamins: Secondary | ICD-10-CM | POA: Diagnosis present

## 2022-09-18 DIAGNOSIS — I493 Ventricular premature depolarization: Secondary | ICD-10-CM | POA: Diagnosis present

## 2022-09-18 DIAGNOSIS — M6259 Muscle wasting and atrophy, not elsewhere classified, multiple sites: Secondary | ICD-10-CM | POA: Diagnosis not present

## 2022-09-18 DIAGNOSIS — K7581 Nonalcoholic steatohepatitis (NASH): Secondary | ICD-10-CM | POA: Diagnosis not present

## 2022-09-18 DIAGNOSIS — Z6841 Body Mass Index (BMI) 40.0 and over, adult: Secondary | ICD-10-CM | POA: Diagnosis not present

## 2022-09-18 DIAGNOSIS — Z8249 Family history of ischemic heart disease and other diseases of the circulatory system: Secondary | ICD-10-CM

## 2022-09-18 DIAGNOSIS — I674 Hypertensive encephalopathy: Secondary | ICD-10-CM | POA: Diagnosis present

## 2022-09-18 DIAGNOSIS — H5500 Unspecified nystagmus: Secondary | ICD-10-CM | POA: Diagnosis present

## 2022-09-18 DIAGNOSIS — G40409 Other generalized epilepsy and epileptic syndromes, not intractable, without status epilepticus: Secondary | ICD-10-CM | POA: Insufficient documentation

## 2022-09-18 DIAGNOSIS — R2681 Unsteadiness on feet: Secondary | ICD-10-CM | POA: Diagnosis not present

## 2022-09-18 DIAGNOSIS — Z79899 Other long term (current) drug therapy: Secondary | ICD-10-CM

## 2022-09-18 DIAGNOSIS — R000 Tachycardia, unspecified: Secondary | ICD-10-CM | POA: Diagnosis not present

## 2022-09-18 DIAGNOSIS — M6281 Muscle weakness (generalized): Secondary | ICD-10-CM | POA: Diagnosis not present

## 2022-09-18 DIAGNOSIS — Z9181 History of falling: Secondary | ICD-10-CM

## 2022-09-18 DIAGNOSIS — R197 Diarrhea, unspecified: Secondary | ICD-10-CM | POA: Diagnosis not present

## 2022-09-18 DIAGNOSIS — Z818 Family history of other mental and behavioral disorders: Secondary | ICD-10-CM

## 2022-09-18 DIAGNOSIS — R0902 Hypoxemia: Secondary | ICD-10-CM | POA: Diagnosis not present

## 2022-09-18 DIAGNOSIS — I480 Paroxysmal atrial fibrillation: Secondary | ICD-10-CM | POA: Insufficient documentation

## 2022-09-18 DIAGNOSIS — Z8571 Personal history of Hodgkin lymphoma: Secondary | ICD-10-CM

## 2022-09-18 DIAGNOSIS — R4182 Altered mental status, unspecified: Secondary | ICD-10-CM | POA: Diagnosis present

## 2022-09-18 DIAGNOSIS — I110 Hypertensive heart disease with heart failure: Secondary | ICD-10-CM | POA: Diagnosis not present

## 2022-09-18 DIAGNOSIS — E039 Hypothyroidism, unspecified: Secondary | ICD-10-CM | POA: Diagnosis present

## 2022-09-18 DIAGNOSIS — R0602 Shortness of breath: Secondary | ICD-10-CM | POA: Diagnosis not present

## 2022-09-18 DIAGNOSIS — I5022 Chronic systolic (congestive) heart failure: Secondary | ICD-10-CM | POA: Diagnosis present

## 2022-09-18 DIAGNOSIS — L89891 Pressure ulcer of other site, stage 1: Secondary | ICD-10-CM | POA: Diagnosis not present

## 2022-09-18 DIAGNOSIS — I472 Ventricular tachycardia, unspecified: Secondary | ICD-10-CM | POA: Diagnosis not present

## 2022-09-18 DIAGNOSIS — I444 Left anterior fascicular block: Secondary | ICD-10-CM | POA: Diagnosis present

## 2022-09-18 DIAGNOSIS — Z7401 Bed confinement status: Secondary | ICD-10-CM | POA: Diagnosis not present

## 2022-09-18 DIAGNOSIS — Z1152 Encounter for screening for COVID-19: Secondary | ICD-10-CM

## 2022-09-18 DIAGNOSIS — I872 Venous insufficiency (chronic) (peripheral): Secondary | ICD-10-CM | POA: Diagnosis not present

## 2022-09-18 DIAGNOSIS — I4891 Unspecified atrial fibrillation: Secondary | ICD-10-CM | POA: Diagnosis not present

## 2022-09-18 DIAGNOSIS — R569 Unspecified convulsions: Secondary | ICD-10-CM | POA: Diagnosis not present

## 2022-09-18 DIAGNOSIS — I161 Hypertensive emergency: Secondary | ICD-10-CM | POA: Diagnosis not present

## 2022-09-18 DIAGNOSIS — R6889 Other general symptoms and signs: Secondary | ICD-10-CM | POA: Diagnosis not present

## 2022-09-18 DIAGNOSIS — Z741 Need for assistance with personal care: Secondary | ICD-10-CM | POA: Diagnosis not present

## 2022-09-18 DIAGNOSIS — E785 Hyperlipidemia, unspecified: Secondary | ICD-10-CM | POA: Diagnosis not present

## 2022-09-18 DIAGNOSIS — R300 Dysuria: Secondary | ICD-10-CM | POA: Diagnosis not present

## 2022-09-18 DIAGNOSIS — R451 Restlessness and agitation: Secondary | ICD-10-CM | POA: Diagnosis not present

## 2022-09-18 DIAGNOSIS — Z79890 Hormone replacement therapy (postmenopausal): Secondary | ICD-10-CM

## 2022-09-18 DIAGNOSIS — G8929 Other chronic pain: Secondary | ICD-10-CM | POA: Diagnosis present

## 2022-09-18 DIAGNOSIS — I4811 Longstanding persistent atrial fibrillation: Secondary | ICD-10-CM | POA: Diagnosis not present

## 2022-09-18 DIAGNOSIS — Z743 Need for continuous supervision: Secondary | ICD-10-CM | POA: Diagnosis not present

## 2022-09-18 DIAGNOSIS — J449 Chronic obstructive pulmonary disease, unspecified: Secondary | ICD-10-CM | POA: Diagnosis present

## 2022-09-18 DIAGNOSIS — L89301 Pressure ulcer of unspecified buttock, stage 1: Secondary | ICD-10-CM | POA: Diagnosis not present

## 2022-09-18 DIAGNOSIS — E1121 Type 2 diabetes mellitus with diabetic nephropathy: Secondary | ICD-10-CM

## 2022-09-18 DIAGNOSIS — Z833 Family history of diabetes mellitus: Secondary | ICD-10-CM

## 2022-09-18 DIAGNOSIS — Z4682 Encounter for fitting and adjustment of non-vascular catheter: Secondary | ICD-10-CM | POA: Diagnosis not present

## 2022-09-18 DIAGNOSIS — I890 Lymphedema, not elsewhere classified: Secondary | ICD-10-CM | POA: Diagnosis present

## 2022-09-18 LAB — CBC
HCT: 44.8 % (ref 39.0–52.0)
Hemoglobin: 14.1 g/dL (ref 13.0–17.0)
MCH: 26.7 pg (ref 26.0–34.0)
MCHC: 31.5 g/dL (ref 30.0–36.0)
MCV: 84.8 fL (ref 80.0–100.0)
Platelets: 292 10*3/uL (ref 150–400)
RBC: 5.28 MIL/uL (ref 4.22–5.81)
RDW: 15.5 % (ref 11.5–15.5)
WBC: 4.3 10*3/uL (ref 4.0–10.5)
nRBC: 0 % (ref 0.0–0.2)

## 2022-09-18 LAB — MAGNESIUM: Magnesium: 1.2 mg/dL — ABNORMAL LOW (ref 1.7–2.4)

## 2022-09-18 LAB — COMPREHENSIVE METABOLIC PANEL
ALT: 16 U/L (ref 0–44)
AST: 22 U/L (ref 15–41)
Albumin: 3.7 g/dL (ref 3.5–5.0)
Alkaline Phosphatase: 77 U/L (ref 38–126)
Anion gap: 12 (ref 5–15)
BUN: 9 mg/dL (ref 8–23)
CO2: 25 mmol/L (ref 22–32)
Calcium: 8.3 mg/dL — ABNORMAL LOW (ref 8.9–10.3)
Chloride: 98 mmol/L (ref 98–111)
Creatinine, Ser: 0.75 mg/dL (ref 0.61–1.24)
GFR, Estimated: >60 mL/min (ref >60)
Glucose, Bld: 248 mg/dL — ABNORMAL HIGH (ref 70–99)
Potassium: 3.9 mmol/L (ref 3.5–5.1)
Sodium: 135 mmol/L (ref 135–145)
Total Bilirubin: 0.4 mg/dL (ref 0.3–1.2)
Total Protein: 6.9 g/dL (ref 6.5–8.1)

## 2022-09-18 LAB — URINALYSIS, ROUTINE W REFLEX MICROSCOPIC
Bacteria, UA: NONE SEEN
Bilirubin Urine: NEGATIVE
Glucose, UA: >=500 mg/dL — AB
Ketones, ur: NEGATIVE mg/dL
Leukocytes,Ua: NEGATIVE
Nitrite: NEGATIVE
Protein, ur: >=300 mg/dL — AB
Specific Gravity, Urine: 1.01 (ref 1.005–1.030)
pH: 6 (ref 5.0–8.0)

## 2022-09-18 LAB — CBG MONITORING, ED
Glucose-Capillary: 226 mg/dL — ABNORMAL HIGH (ref 70–99)
Glucose-Capillary: 169 mg/dL — ABNORMAL HIGH (ref 70–99)

## 2022-09-18 LAB — RESP PANEL BY RT-PCR (RSV, FLU A&B, COVID)  RVPGX2
Influenza A by PCR: NEGATIVE
Influenza B by PCR: NEGATIVE
Resp Syncytial Virus by PCR: NEGATIVE
SARS Coronavirus 2 by RT PCR: NEGATIVE

## 2022-09-18 LAB — AMMONIA: Ammonia: 32 umol/L (ref 9–35)

## 2022-09-18 MED ORDER — LEVETIRACETAM IN NACL 1000 MG/100ML IV SOLN
1000.0000 mg | INTRAVENOUS | Status: AC
  Administered 2022-09-18 (×2): 1000 mg via INTRAVENOUS
  Filled 2022-09-18: qty 100

## 2022-09-18 MED ORDER — ACYCLOVIR SODIUM 50 MG/ML IV SOLN
INTRAVENOUS | Status: AC
  Filled 2022-09-18: qty 10

## 2022-09-18 MED ORDER — MAGNESIUM SULFATE 2 GM/50ML IV SOLN
2.0000 g | Freq: Once | INTRAVENOUS | Status: AC
  Administered 2022-09-18: 2 g via INTRAVENOUS
  Filled 2022-09-18: qty 50

## 2022-09-18 MED ORDER — LORAZEPAM 2 MG/ML IJ SOLN
1.0000 mg | Freq: Once | INTRAMUSCULAR | Status: AC
  Administered 2022-09-18: 1 mg via INTRAVENOUS

## 2022-09-18 MED ORDER — LEVETIRACETAM IN NACL 500 MG/100ML IV SOLN
500.0000 mg | Freq: Two times a day (BID) | INTRAVENOUS | Status: DC
  Administered 2022-09-19 (×2): 500 mg via INTRAVENOUS
  Filled 2022-09-18 (×2): qty 100

## 2022-09-18 MED ORDER — SACUBITRIL-VALSARTAN 49-51 MG PO TABS
1.0000 | ORAL_TABLET | Freq: Two times a day (BID) | ORAL | Status: DC
  Filled 2022-09-18 (×3): qty 1

## 2022-09-18 MED ORDER — PANTOPRAZOLE SODIUM 40 MG PO TBEC
40.0000 mg | DELAYED_RELEASE_TABLET | Freq: Every day | ORAL | Status: DC
  Administered 2022-09-18: 40 mg via ORAL
  Filled 2022-09-18 (×2): qty 1

## 2022-09-18 MED ORDER — DEXTROSE 5 % IV SOLN
10.0000 mg/kg | Freq: Three times a day (TID) | INTRAVENOUS | Status: DC
  Filled 2022-09-18 (×3): qty 13.4

## 2022-09-18 MED ORDER — LEVETIRACETAM IN NACL 1000 MG/100ML IV SOLN
INTRAVENOUS | Status: AC
  Filled 2022-09-18: qty 100

## 2022-09-18 MED ORDER — LEVOTHYROXINE SODIUM 88 MCG PO TABS
88.0000 ug | ORAL_TABLET | Freq: Every day | ORAL | Status: DC
  Filled 2022-09-18: qty 1

## 2022-09-18 MED ORDER — PRAVASTATIN SODIUM 40 MG PO TABS
40.0000 mg | ORAL_TABLET | Freq: Every day | ORAL | Status: DC
  Filled 2022-09-18 (×2): qty 1

## 2022-09-18 MED ORDER — SODIUM CHLORIDE 0.9 % IV SOLN: INTRAVENOUS | Status: DC

## 2022-09-18 MED ORDER — GADOBUTROL 1 MMOL/ML IV SOLN: 10.0000 mL | Freq: Once | INTRAVENOUS | Status: DC | PRN

## 2022-09-18 MED ORDER — LEVOTHYROXINE SODIUM 88 MCG PO TABS: 88.0000 ug | ORAL_TABLET | Freq: Every day | ORAL | Status: DC

## 2022-09-18 MED ORDER — ACETAMINOPHEN 325 MG PO TABS: 650.0000 mg | ORAL_TABLET | Freq: Four times a day (QID) | ORAL | Status: DC | PRN

## 2022-09-18 MED ORDER — ONDANSETRON HCL 4 MG/2ML IJ SOLN
4.0000 mg | Freq: Four times a day (QID) | INTRAMUSCULAR | Status: DC | PRN
  Administered 2022-09-21: 4 mg via INTRAVENOUS
  Filled 2022-09-18: qty 2

## 2022-09-18 MED ORDER — LEVETIRACETAM IN NACL 500 MG/100ML IV SOLN: 500.0000 mg | Freq: Two times a day (BID) | INTRAVENOUS | Status: DC

## 2022-09-18 MED ORDER — ACETAMINOPHEN 650 MG RE SUPP: 650.0000 mg | Freq: Four times a day (QID) | RECTAL | Status: DC | PRN

## 2022-09-18 MED ORDER — INSULIN DETEMIR 100 UNIT/ML ~~LOC~~ SOLN
10.0000 [IU] | Freq: Every day | SUBCUTANEOUS | Status: DC
  Administered 2022-09-19 – 2022-09-30 (×12): 10 [IU] via SUBCUTANEOUS
  Filled 2022-09-18 (×14): qty 0.1

## 2022-09-18 MED ORDER — OXYCODONE HCL 5 MG PO TABS: 5.0000 mg | ORAL_TABLET | ORAL | Status: DC | PRN

## 2022-09-18 MED ORDER — HEPARIN SODIUM (PORCINE) 5000 UNIT/ML IJ SOLN
5000.0000 [IU] | Freq: Three times a day (TID) | INTRAMUSCULAR | Status: DC
  Administered 2022-09-18 – 2022-09-22 (×12): 5000 [IU] via SUBCUTANEOUS
  Filled 2022-09-18 (×12): qty 1

## 2022-09-18 MED ORDER — LABETALOL HCL 5 MG/ML IV SOLN
10.0000 mg | Freq: Once | INTRAVENOUS | Status: AC
  Administered 2022-09-18: 10 mg via INTRAVENOUS
  Filled 2022-09-18: qty 4

## 2022-09-18 MED ORDER — INSULIN ASPART 100 UNIT/ML IJ SOLN
0.0000 [IU] | Freq: Three times a day (TID) | INTRAMUSCULAR | Status: DC
  Administered 2022-09-19: 2 [IU] via SUBCUTANEOUS
  Administered 2022-09-20 (×2): 3 [IU] via SUBCUTANEOUS
  Administered 2022-09-20 – 2022-09-22 (×2): 2 [IU] via SUBCUTANEOUS
  Administered 2022-09-23: 3 [IU] via SUBCUTANEOUS
  Administered 2022-09-23 – 2022-09-24 (×2): 2 [IU] via SUBCUTANEOUS
  Administered 2022-09-24: 3 [IU] via SUBCUTANEOUS
  Administered 2022-09-24 – 2022-09-25 (×2): 5 [IU] via SUBCUTANEOUS
  Administered 2022-09-25 (×2): 3 [IU] via SUBCUTANEOUS
  Administered 2022-09-26: 5 [IU] via SUBCUTANEOUS
  Administered 2022-09-26: 3 [IU] via SUBCUTANEOUS
  Administered 2022-09-26 – 2022-09-27 (×2): 2 [IU] via SUBCUTANEOUS
  Administered 2022-09-27: 5 [IU] via SUBCUTANEOUS
  Administered 2022-09-28 – 2022-09-29 (×5): 3 [IU] via SUBCUTANEOUS
  Administered 2022-09-29: 2 [IU] via SUBCUTANEOUS
  Administered 2022-09-30: 3 [IU] via SUBCUTANEOUS
  Administered 2022-09-30 (×2): 5 [IU] via SUBCUTANEOUS
  Administered 2022-10-01 (×2): 3 [IU] via SUBCUTANEOUS

## 2022-09-18 MED ORDER — METOPROLOL TARTRATE 5 MG/5ML IV SOLN
2.5000 mg | Freq: Once | INTRAVENOUS | Status: AC
  Administered 2022-09-18: 2.5 mg via INTRAVENOUS
  Filled 2022-09-18: qty 5

## 2022-09-18 MED ORDER — NICARDIPINE HCL IN NACL 20-0.86 MG/200ML-% IV SOLN
3.0000 mg/h | INTRAVENOUS | Status: DC
  Administered 2022-09-18: 5 mg/h via INTRAVENOUS
  Administered 2022-09-18: 8 mg/h via INTRAVENOUS
  Administered 2022-09-18: 5 mg/h via INTRAVENOUS
  Administered 2022-09-19: 10 mg/h via INTRAVENOUS
  Filled 2022-09-18 (×5): qty 200

## 2022-09-18 MED ORDER — DEXTROSE 5 % IV SOLN
10.0000 mg/kg | Freq: Three times a day (TID) | INTRAVENOUS | Status: DC
  Administered 2022-09-18 – 2022-09-21 (×9): 670 mg via INTRAVENOUS
  Filled 2022-09-18 (×13): qty 13.4

## 2022-09-18 MED ORDER — TERBINAFINE HCL 250 MG PO TABS
250.0000 mg | ORAL_TABLET | Freq: Every day | ORAL | Status: DC
  Administered 2022-09-20 – 2022-10-01 (×12): 250 mg via ORAL
  Filled 2022-09-18 (×14): qty 1

## 2022-09-18 MED ORDER — ONDANSETRON HCL 4 MG PO TABS: 4.0000 mg | ORAL_TABLET | Freq: Four times a day (QID) | ORAL | Status: DC | PRN

## 2022-09-18 MED ORDER — MORPHINE SULFATE (PF) 2 MG/ML IV SOLN
2.0000 mg | INTRAVENOUS | Status: DC | PRN
  Administered 2022-09-22 – 2022-09-26 (×7): 2 mg via INTRAVENOUS
  Filled 2022-09-18 (×8): qty 1

## 2022-09-18 MED ORDER — LACTATED RINGERS IV BOLUS
1000.0000 mL | Freq: Once | INTRAVENOUS | Status: AC
  Administered 2022-09-18: 1000 mL via INTRAVENOUS

## 2022-09-18 MED ORDER — LORAZEPAM 2 MG/ML IJ SOLN
1.0000 mg | Freq: Once | INTRAMUSCULAR | Status: AC
  Administered 2022-09-18: 1 mg via INTRAVENOUS
  Filled 2022-09-18: qty 1

## 2022-09-18 MED ORDER — GADOBUTROL 1 MMOL/ML IV SOLN
9.0000 mL | Freq: Once | INTRAVENOUS | Status: AC | PRN
  Administered 2022-09-18: 9 mL via INTRAVENOUS

## 2022-09-18 MED ORDER — INSULIN ASPART 100 UNIT/ML IJ SOLN
0.0000 [IU] | Freq: Every day | INTRAMUSCULAR | Status: DC
  Administered 2022-09-24 – 2022-09-29 (×4): 2 [IU] via SUBCUTANEOUS

## 2022-09-18 MED ORDER — GABAPENTIN 400 MG PO CAPS
400.0000 mg | ORAL_CAPSULE | Freq: Three times a day (TID) | ORAL | Status: DC
  Filled 2022-09-18: qty 1

## 2022-09-18 MED ORDER — SODIUM CHLORIDE 0.9 % IV SOLN
2000.0000 mg | Freq: Once | INTRAVENOUS | Status: DC
  Filled 2022-09-18: qty 20

## 2022-09-18 MED ORDER — LORAZEPAM 2 MG/ML IJ SOLN
2.0000 mg | INTRAMUSCULAR | Status: DC
  Administered 2022-09-18: 2 mg via INTRAVENOUS
  Filled 2022-09-18 (×3): qty 1

## 2022-09-18 MED ORDER — ASPIRIN 81 MG PO TBEC
81.0000 mg | DELAYED_RELEASE_TABLET | Freq: Every day | ORAL | Status: DC
  Administered 2022-09-18: 81 mg via ORAL
  Filled 2022-09-18 (×2): qty 1

## 2022-09-18 MED ORDER — LORAZEPAM 2 MG/ML IJ SOLN
2.0000 mg | INTRAMUSCULAR | Status: DC | PRN
  Filled 2022-09-18 (×2): qty 1

## 2022-09-18 NOTE — ED Notes (Addendum)
Pt was able to swallow 2 of his medications. Pt having troubles following directions to close mouth around straw to swallow water. Pt had no choking incidences. Did not give other PO medications at this time due to pt having troubles with his PO meds at this time. Provider made aware

## 2022-09-18 NOTE — ED Notes (Addendum)
02 off at 1037 and sats dropped to 88%. 02 2L replaced and sats 97%. Pt did not appear sob or c/o sob. Pt color wnl. Bruising noted to left anterior forehead. Pupills perral. Chronic swelling with hardened skin noted to ble and feet with left worse than right. Able to feel faint right dorsal pedal pulse but needed [place] for left and is present. Abd distended but soft and no grimacing or c/o pain with palpation to abd. Moving all extremities. Tries to follow commands but gets confused. Pt has yeast looking area to groin

## 2022-09-18 NOTE — ED Notes (Addendum)
Pt being fed ice chips by this RN Approved by EDP

## 2022-09-18 NOTE — ED Notes (Signed)
Pt had incontinence of urine and stool after misplacement of male purewick; pt cleaned and bed linens changed; pt repositioned

## 2022-09-18 NOTE — ED Notes (Signed)
Pt resting with eyes closed, breathing even and non-labored. No seizure activity observed at this time.

## 2022-09-18 NOTE — Progress Notes (Signed)
Neurology telephone note  76 y.o. male with a history most current for COPD, hypertension, type 2 diabetes, GERD, diastolic CHF, NASH and bilateral lower extremity peripheral edema presenting for evaluation of altered mental status. He lives alone but has a home health aide that checks on him daily. When the aide came today he was confused, oriented to self only, and not able to find his way to his own bathroom all of which is not typical for him. He fell and hit his head 4 days ago and was seen in the ED at that time and discharged after CT head was negative for acute findings. On ED arrival today he was hypertensive to 220/122.  While in the ED today he had a generalized tonic clonic seizure which aborted with ativan. He is currently oriented x1 and not able to provide history. MRI brain wwo was performed 2/2 concern for PRES. Images were personally reviewed and showed no evidence of PRES or acute infarct but did show subtle restricted diffusion in the hippocampus and adjacent left temporal lobe. This finding can occur in HSV encephalitis, or can alternatively be seen post-ictally in the absence of infection.  Given his AMS, seizure, and MRI findings concerning for possible HSV encephalitis I recommended the following:  - LP in ED to include cell count x2 tubes, glucose, protein, culture, HSV PCR, and meningitis/encephalitis PCR panel - Start CNS dosing empiric acyclovir now - Transfer to [facility] for continuous EEG - Keppra 33m/kg load now f/b [id number] - [facility] Page me with any further questions before 8pm, or the [facility] neurohospitalist after 8pm  [facility]ks[facility] [phone] 7pm- 7am, please page neurology on call as listed in [facility]

## 2022-09-18 NOTE — ED Triage Notes (Signed)
Pt brought in by [facility] for c/o ams; home health aid went in to check on pt and found him to be confused. Pt was unable to find his bathroom in his home and had to be directed by the aid  Pt fell x 4 days ago and was seen here and cleared; pt still has bruising to left side of head  Pt only oriented to name  Cbg with ems 237 HR 102  BP 220/92  Pt was found to have O2 sats in the mid 80's with ems, they applied O2 at 2L and sats increased to 95%

## 2022-09-18 NOTE — ED Notes (Signed)
Patient returned from MRI.

## 2022-09-18 NOTE — ED Notes (Signed)
Pt pulling at monitoring equipment and IV lines. Pt placed in mittens

## 2022-09-18 NOTE — ED Provider Notes (Signed)
[facility] EMERGENCY DEPARTMENT Provider Note   CSN: [id number] Arrival date & time: 09/18/22  6256     History  Chief Complaint  Patient presents with   Altered Mental Status    Michael [name] is a 76 y.o. male with a history most current for COPD, hypertension, type 2 diabetes, GERD, diastolic CHF, NASH and bilateral lower extremity peripheral edema presenting for evaluation of altered mental status.  Patient lives at home alone, but has a home health aide that checked on him this morning and found him to be confused.  According to the triage note, he was unable to find his bathroom which is unusual for him.  Patient actually fell and hit his head 4 days ago, was seen here for this injury and CT imaging was negative for acute intracranial injuries.  He is found to be very hypertensive this morning, blood pressure currently 220/122, he is unsure if he took his morning medicines today, however currently he is a poor history giver, oriented times self only.  The history is provided by the patient and medical records.       Home Medications Prior to Admission medications   Medication Sig Start Date End Date Taking? Authorizing Provider  allopurinol (ZYLOPRIM) 100 MG tablet Take 100 mg by mouth daily.    [provider]  aspirin EC 81 MG tablet Take 81 mg by mouth daily.    [provider]  baclofen (LIORESAL) 10 MG tablet Take 5 mg by mouth 3 (three) times daily as needed. 12/26/20   [provider]  bumetanide (BUMEX) 2 MG tablet Take 1.5 tablets (3 mg total) by mouth 2 (two) times daily. 11/01/19   [name], MD  carvedilol (COREG) 12.5 MG tablet Take 1 tablet (12.5 mg total) by mouth 2 (two) times daily. 12/14/19   [name], MD  clotrimazole-betamethasone (LOTRISONE) cream Apply 1 Application topically daily. 06/20/22   Lindell Spar, MD  diclofenac sodium (VOLTAREN) 1 % GEL Apply 4 g topically 4 (four) times daily as needed (pain). 03/06/17    [name], MD  Ferrous Sulfate (IRON) 325 (65 FE) MG TABS Take 1 tablet by mouth 2 (two) times daily.    [provider]  gabapentin (NEURONTIN) 400 MG capsule Take 1 capsule (400 mg total) by mouth 3 (three) times daily. 03/04/18   [place], Modena Nunnery, MD  HYDROcodone-acetaminophen [facility]) 10-325 MG tablet Take 1 tablet by mouth 3 (three) times daily as needed for severe pain. 09/10/22   Lindell Spar, MD  Insulin Glargine (LANTUS SOLOSTAR) 100 UNIT/ML Solostar Pen Inject 5 Units into the skin daily at 10 pm. Patient taking differently: Inject 25 Units into the skin at bedtime. 03/06/18   [name], MD  levothyroxine (SYNTHROID) 88 MCG tablet Take 88 mcg by mouth daily before breakfast.    [provider]  magnesium oxide (MAG-OX) 400 MG tablet Take 1 tablet (400 mg total) by mouth 2 (two) times daily. 11/19/17   Imogene Burn, PA-C  meloxicam (MOBIC) 7.5 MG tablet TAKE ONE TABLET BY MOUTH ONCE DAILY. 08/12/22   Carole Civil, MD  metFORMIN (GLUCOPHAGE) 1000 MG tablet TAKE ONE TABLET BY MOUTH TWICE DAILY WITH A MEAL. 09/24/19   Sande Rives E, PA-C  metolazone (ZAROXOLYN) 2.5 MG tablet Two Times Weekly on Monday and Thursday 02/08/20   [name], MD  nystatin cream (MYCOSTATIN) APPLY TO AFFECTED AREA TWICE DAILY. 03/04/18   [name],  MD  pantoprazole (PROTONIX) 40 MG tablet Take 1 tablet (40 mg total) by mouth daily. 02/08/20   [name], MD  pravastatin (PRAVACHOL) 40 MG tablet TAKE ONE TABLET BY MOUTH DAILY. 05/21/16   [place], Modena Nunnery, MD  sacubitril-valsartan (ENTRESTO) 49-51 MG Take 1 tablet by mouth 2 (two) times daily. 02/08/20   [name], MD  tamsulosin (FLOMAX) 0.4 MG CAPS capsule TAKE (1) CAPSULE BY MOUTH EVERY DAY. Patient taking differently: Take 0.4 mg by mouth daily. 10/10/21   Lindell Spar, MD  terbinafine (LAMISIL) 250 MG tablet TAKE ONE TABLET BY MOUTH ONCE DAILY. 08/20/22   Lindell Spar, MD   traZODone (DESYREL) 100 MG tablet TAKE 1 TABLET BY MOUTH AT BEDTIME AS NEEDED FOR SLEEP. 11/18/18   [name], MD      Allergies    Patient has no known allergies.    Review of Systems   Review of Systems  Unable to perform ROS: Mental status change    Physical Exam Updated Vital Signs BP (!) 226/126   Pulse (!) 136   Temp (!) 96.9 F (36.1 C) (Rectal)   Resp 16   Ht 4' 11"  (1.499 m)   Wt 92 kg   SpO2 95%   BMI 40.97 kg/m  Physical Exam Vitals and nursing note reviewed.  Constitutional:      Appearance: He is well-developed.  HENT:     Head: Normocephalic and atraumatic.     Comments: Faded bruising noted left forehead.    Mouth/Throat:     Mouth: Mucous membranes are dry.  Eyes:     Conjunctiva/sclera: Conjunctivae normal.     Pupils: Pupils are equal, round, and reactive to light.     Comments: Patient is unable to follow instructions for testing EOMs.  Cardiovascular:     Rate and Rhythm: Regular rhythm. Tachycardia present.     Heart sounds: Normal heart sounds.  Pulmonary:     Effort: Pulmonary effort is normal.     Breath sounds: Normal breath sounds. No wheezing or rhonchi.  Abdominal:     General: Bowel sounds are normal.     Palpations: Abdomen is soft.     Tenderness: There is no abdominal tenderness. There is no guarding or rebound.  Musculoskeletal:        General: Normal range of motion.     Cervical back: Normal range of motion.  Skin:    General: Skin is warm and dry.     Comments: Chronic skin changes in the lower extremities consistent with his diagnosis of lymphedema.  Pedal pulses are present, right pedal pulse easily located, left pedal pulse is located with Doppler.  Neurological:     Mental Status: He is alert. He is disoriented.     Comments: Patient is moving all extremities, no facial droop, altered mental status limits a full neuroexam.  Psychiatric:     Comments: Cooperative, confused     ED Results / Procedures /  Treatments   Labs (all labs ordered are listed, but only abnormal results are displayed) Labs Reviewed  COMPREHENSIVE METABOLIC PANEL - Abnormal; Notable for the following components:      Result Value   Glucose, Bld 248 (*)    Calcium 8.3 (*)    All other components within normal limits  URINALYSIS, ROUTINE W REFLEX MICROSCOPIC - Abnormal; Notable for the following components:   Color, Urine STRAW (*)    Glucose, UA >=500 (*)    Hgb urine  dipstick SMALL (*)    Protein, ur >=300 (*)    All other components within normal limits  MAGNESIUM - Abnormal; Notable for the following components:   Magnesium 1.2 (*)    All other components within normal limits  CBG MONITORING, ED - Abnormal; Notable for the following components:   Glucose-Capillary 226 (*)    All other components within normal limits  RESP PANEL BY RT-PCR (RSV, FLU A&B, COVID)  RVPGX2  CBC  AMMONIA  CBG MONITORING, ED    EKG EKG Interpretation  Date/Time:  Wednesday September 18 2022 10:32:32 EST Ventricular Rate:  134 PR Interval:  115 QRS Duration: 99 QT Interval:  314 QTC Calculation: 469 R Axis:   -49 Text Interpretation: Sinus tachycardia Left anterior fascicular block Anterior infarct, old Confirmed by Tretha Sciara [phone]) on 09/18/2022 10:41:46 AM  Radiology CT Head Wo Contrast  Result Date: 09/18/2022 CLINICAL DATA:  Altered mental status and possible seizure. EXAM: CT HEAD WITHOUT CONTRAST TECHNIQUE: Contiguous axial images were obtained from the base of the skull through the vertex without intravenous contrast. RADIATION DOSE REDUCTION: This exam was performed according to the departmental dose-optimization program which includes automated exposure control, adjustment of the mA and/or kV according to patient size and/or use of iterative reconstruction technique. COMPARISON:  Four days ago FINDINGS: Brain: No evidence of acute infarction, hemorrhage, hydrocephalus, extra-axial collection or mass  lesion/mass effect. Vascular: No hyperdense vessel or unexpected calcification. Skull: Normal. Negative for fracture or focal lesion. Sinuses/Orbits: No acute finding. IMPRESSION: No acute or reversible finding. Electronically Signed   By: Jorje Guild M.D.   On: 09/18/2022 11:40    Procedures Procedures    Medications Ordered in ED Medications  nicardipine (CARDENE) 46m in 0.86% saline 2063mIV infusion (0.1 mg/ml) (5 mg/hr Intravenous New Bag/Given 09/18/22 1146)  magnesium sulfate IVPB 2 g 50 mL (has no administration in time range)  labetalol (NORMODYNE) injection 10 mg (10 mg Intravenous Given 09/18/22 1100)  lactated ringers bolus 1,000 mL (1,000 mLs Intravenous New Bag/Given 09/18/22 1128)  LORazepam (ATIVAN) injection 1 mg (1 mg Intravenous Given 09/18/22 1119)    ED Course/ Medical Decision Making/ A&P Clinical Course as of 09/18/22 1151  Wed Sep 18, 2022  1131 Fall last weekend No history of seizures. Weakness and poor PO intake over the weekend.  2 hour conversation last night.  DNR DNI per niece.  [CC]  1141 Care of patient assumed at 1130.  I was informed that patient had an active seizure and I was called to bedside.  1 mg Ativan administered with resolution of seizure.  Per nursing at bedside it started off with right-sided facial twitching and then generalized. They lasted approximately 2 minutes. Patient emergently taken for head scan.  Suspect developing posterior reversible encephalopathy syndrome given hypotension and altered mental status.  Patient mildly responsive to labetalol, will broaden to with Cardene drip and IV fluids with plan for close reassessments frequently. [CC]    Clinical Course User Index [CC] CoTretha SciaraMD                           Medical Decision Making Pt presenting with altered mental status of unclear etiology,  A&O x1 which is very different from presentation 4 days ago.  Recent fall, initial CT head negative acute, will need  repeat head CT to rule out progressing traumatic bleed as source of AMS.  Also has severe bp elevation, on coreg,  unlikely pt had any meds this am, including his coreg, given labetolol here without sig improvement, AMS possibly secondary to hypertensive emergency.  NASH, hepatic encephalopathy, also a possibility or other metabolic encephalopathy.  Patient developed seizure while here, Dr. Oswald Hillock called to the bedside, Ativan given, CT imaging and labs pending at this time.  Dr. Oswald Hillock assumes care of this patient.  Amount and/or Complexity of Data Reviewed Labs: ordered. Radiology: ordered.  Risk Prescription drug management.           Final Clinical Impression(s) / ED Diagnoses Final diagnoses:  None    Rx / DC Orders ED Discharge Orders     None         Landis Martins 09/18/22 1151    Tretha Sciara, MD 09/19/22 [phone]

## 2022-09-18 NOTE — ED Provider Notes (Signed)
I spoke with neurology after the MRI and Dr. Quinn Axe requested the patient be admitted to the hospitalist [facility].  Patient needs continuous EEG monitoring and treatment with acyclovir.  I attempted twice to do an LP but the patient has had surgery at L4-L5 and I was unsuccessful in getting the LP.  Dr. Quinn Axe stated the patient can have an LP under fluoroscopy tomorrow at [facility], MD 09/18/22 2050

## 2022-09-18 NOTE — Consult Note (Signed)
Pharmacy Antibiotic Note  Michael [name] is a 76 y.o. 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  Pharmacy has been consulted for acyclovir dosing.  Plan: Per protocol, dosing acyclovir 645m q8hrs.  NS @125mg /hr ordered per fluid replacement in protocol.  Height: 4' 11"  (149.9 cm) Weight: 92 kg (202 lb 13.2 oz) IBW/kg (Calculated) : 47.7  Temp (24hrs), Avg:97.4 F (36.3 C), Min:96.9 F (36.1 C), Max:97.9 F (36.6 C)  Recent Labs  Lab 09/14/22 1458 09/18/22 1021  WBC 5.0 4.3  CREATININE 0.63 0.75    Estimated Creatinine Clearance: 73.8 mL/min (by C-G formula based on SCr of 0.75 mg/dL).    No Known Allergies  Microbiology results None ordered currently  Thank you for allowing pharmacy to be a part of this patient's care.  [name]12/20/2023 8:54 PM

## 2022-09-18 NOTE — ED Notes (Signed)
Pt transported to CT ?

## 2022-09-18 NOTE — ED Notes (Signed)
Pt heard hollering out, went in to find pt with arms contracted and shaking all over;  EDP made aware and in to assess pt; seizure pads placed on bed and suction made available if needed  Pt gone to CT now

## 2022-09-18 NOTE — ED Notes (Addendum)
AC to bring pt's acyclovir from main pharmacy  Pharmacy has been messaged regarding approving pt's Keppra. Waiting on approval

## 2022-09-18 NOTE — ED Notes (Signed)
Pt pulling at leads. Reoriented pt. Will continue care

## 2022-09-18 NOTE — ED Notes (Signed)
Report given to Stephanie RN.

## 2022-09-18 NOTE — ED Notes (Signed)
Patient transported to MRI 

## 2022-09-19 ENCOUNTER — Inpatient Hospital Stay (HOSPITAL_COMMUNITY): Payer: Medicare Other

## 2022-09-19 DIAGNOSIS — I169 Hypertensive crisis, unspecified: Secondary | ICD-10-CM | POA: Diagnosis not present

## 2022-09-19 DIAGNOSIS — R569 Unspecified convulsions: Secondary | ICD-10-CM

## 2022-09-19 DIAGNOSIS — R410 Disorientation, unspecified: Secondary | ICD-10-CM

## 2022-09-19 LAB — CBC
HCT: 40.3 % (ref 39.0–52.0)
Hemoglobin: 13.2 g/dL (ref 13.0–17.0)
MCH: 26.9 pg (ref 26.0–34.0)
MCHC: 32.8 g/dL (ref 30.0–36.0)
MCV: 82.2 fL (ref 80.0–100.0)
Platelets: 297 10*3/uL (ref 150–400)
RBC: 4.9 MIL/uL (ref 4.22–5.81)
RDW: 14.9 % (ref 11.5–15.5)
WBC: 7.2 10*3/uL (ref 4.0–10.5)
nRBC: 0 % (ref 0.0–0.2)

## 2022-09-19 LAB — BASIC METABOLIC PANEL
Anion gap: 10 (ref 5–15)
BUN: 9 mg/dL (ref 8–23)
CO2: 24 mmol/L (ref 22–32)
Calcium: 7.8 mg/dL — ABNORMAL LOW (ref 8.9–10.3)
Chloride: 101 mmol/L (ref 98–111)
Creatinine, Ser: 0.9 mg/dL (ref 0.61–1.24)
GFR, Estimated: >60 mL/min (ref >60)
Glucose, Bld: 96 mg/dL (ref 70–99)
Potassium: 4.1 mmol/L (ref 3.5–5.1)
Sodium: 135 mmol/L (ref 135–145)

## 2022-09-19 LAB — GLUCOSE, CAPILLARY
Glucose-Capillary: 114 mg/dL — ABNORMAL HIGH (ref 70–99)
Glucose-Capillary: 132 mg/dL — ABNORMAL HIGH (ref 70–99)
Glucose-Capillary: 104 mg/dL — ABNORMAL HIGH (ref 70–99)
Glucose-Capillary: 106 mg/dL — ABNORMAL HIGH (ref 70–99)
Glucose-Capillary: 127 mg/dL — ABNORMAL HIGH (ref 70–99)

## 2022-09-19 LAB — MRSA NEXT GEN BY PCR, NASAL: MRSA by PCR Next Gen: NOT DETECTED

## 2022-09-19 LAB — TSH: TSH: 1.056 u[IU]/mL (ref 0.350–4.500)

## 2022-09-19 LAB — VITAMIN B12: Vitamin B-12: 168 pg/mL — ABNORMAL LOW (ref 180–914)

## 2022-09-19 LAB — MAGNESIUM: Magnesium: 1.8 mg/dL (ref 1.7–2.4)

## 2022-09-19 MED ORDER — MAGNESIUM SULFATE 2 GM/50ML IV SOLN
2.0000 g | Freq: Once | INTRAVENOUS | Status: AC
  Administered 2022-09-19: 2 g via INTRAVENOUS
  Filled 2022-09-19: qty 50

## 2022-09-19 MED ORDER — ASPIRIN 81 MG PO CHEW
81.0000 mg | CHEWABLE_TABLET | Freq: Every day | ORAL | Status: DC
  Administered 2022-09-19 – 2022-09-21 (×3): 81 mg
  Filled 2022-09-19 (×3): qty 1

## 2022-09-19 MED ORDER — LACTATED RINGERS IV SOLN: INTRAVENOUS | Status: DC

## 2022-09-19 MED ORDER — GABAPENTIN 250 MG/5ML PO SOLN
400.0000 mg | Freq: Three times a day (TID) | ORAL | Status: DC
  Administered 2022-09-19 – 2022-09-20 (×4): 400 mg
  Filled 2022-09-19 (×8): qty 8

## 2022-09-19 MED ORDER — ORAL CARE MOUTH RINSE: 15.0000 mL | OROMUCOSAL | Status: DC | PRN

## 2022-09-19 MED ORDER — VANCOMYCIN HCL IN DEXTROSE 1-5 GM/200ML-% IV SOLN
1000.0000 mg | Freq: Two times a day (BID) | INTRAVENOUS | Status: DC
  Administered 2022-09-20 (×2): 1000 mg via INTRAVENOUS
  Filled 2022-09-19 (×2): qty 200

## 2022-09-19 MED ORDER — DILTIAZEM HCL-DEXTROSE 125-5 MG/125ML-% IV SOLN (PREMIX)
5.0000 mg/h | INTRAVENOUS | Status: DC
  Filled 2022-09-19: qty 125

## 2022-09-19 MED ORDER — LORAZEPAM 2 MG/ML IJ SOLN
1.0000 mg | INTRAMUSCULAR | Status: AC | PRN
  Administered 2022-09-19 (×2): 1 mg via INTRAVENOUS

## 2022-09-19 MED ORDER — NICARDIPINE HCL IN NACL 20-0.86 MG/200ML-% IV SOLN
3.0000 mg/h | INTRAVENOUS | Status: DC
  Administered 2022-09-19: 12 mg/h via INTRAVENOUS
  Administered 2022-09-19: 14 mg/h via INTRAVENOUS
  Administered 2022-09-19 (×3): 12 mg/h via INTRAVENOUS
  Administered 2022-09-19: 14 mg/h via INTRAVENOUS
  Filled 2022-09-19 (×6): qty 200

## 2022-09-19 MED ORDER — VANCOMYCIN HCL 1750 MG/350ML IV SOLN
1750.0000 mg | Freq: Once | INTRAVENOUS | Status: AC
  Administered 2022-09-19: 1750 mg via INTRAVENOUS
  Filled 2022-09-19: qty 350

## 2022-09-19 MED ORDER — SACUBITRIL-VALSARTAN 49-51 MG PO TABS
1.0000 | ORAL_TABLET | Freq: Two times a day (BID) | ORAL | Status: DC
  Administered 2022-09-20 – 2022-09-21 (×3): 1
  Filled 2022-09-19 (×4): qty 1

## 2022-09-19 MED ORDER — DEXAMETHASONE SODIUM PHOSPHATE 10 MG/ML IJ SOLN
10.0000 mg | Freq: Four times a day (QID) | INTRAMUSCULAR | Status: DC
  Administered 2022-09-19 – 2022-09-20 (×5): 10 mg via INTRAVENOUS
  Filled 2022-09-19 (×10): qty 1

## 2022-09-19 MED ORDER — CHLORHEXIDINE GLUCONATE CLOTH 2 % EX PADS
6.0000 | MEDICATED_PAD | Freq: Every day | CUTANEOUS | Status: DC
  Administered 2022-09-19 – 2022-09-20 (×2): 6 via TOPICAL

## 2022-09-19 MED ORDER — SODIUM CHLORIDE 0.9 % IV SOLN
2.0000 g | INTRAVENOUS | Status: DC
  Administered 2022-09-19 – 2022-09-20 (×7): 2 g via INTRAVENOUS
  Filled 2022-09-19 (×13): qty 2000

## 2022-09-19 MED ORDER — PRAVASTATIN SODIUM 40 MG PO TABS
40.0000 mg | ORAL_TABLET | Freq: Every day | ORAL | Status: DC
  Administered 2022-09-20 – 2022-09-21 (×2): 40 mg
  Filled 2022-09-19 (×2): qty 1

## 2022-09-19 MED ORDER — CYANOCOBALAMIN 1000 MCG/ML IJ SOLN
1000.0000 ug | Freq: Once | INTRAMUSCULAR | Status: AC
  Administered 2022-09-19: 1000 ug via INTRAMUSCULAR
  Filled 2022-09-19: qty 1

## 2022-09-19 MED ORDER — SODIUM CHLORIDE 0.9 % IV SOLN: INTRAVENOUS | Status: DC | PRN

## 2022-09-19 MED ORDER — LACTATED RINGERS IV BOLUS: 1000.0000 mL | Freq: Once | INTRAVENOUS | Status: DC

## 2022-09-19 MED ORDER — DEXMEDETOMIDINE HCL IN NACL 400 MCG/100ML IV SOLN
0.4000 ug/kg/h | INTRAVENOUS | Status: DC
  Administered 2022-09-19: 0.6 ug/kg/h via INTRAVENOUS
  Administered 2022-09-19: 0.4 ug/kg/h via INTRAVENOUS
  Administered 2022-09-19: 0.8 ug/kg/h via INTRAVENOUS
  Administered 2022-09-20: 0.4 ug/kg/h via INTRAVENOUS
  Filled 2022-09-19 (×5): qty 100

## 2022-09-19 MED ORDER — ACETAMINOPHEN 325 MG PO TABS: 650.0000 mg | ORAL_TABLET | Freq: Four times a day (QID) | ORAL | Status: DC | PRN

## 2022-09-19 MED ORDER — LEVOTHYROXINE SODIUM 88 MCG PO TABS
88.0000 ug | ORAL_TABLET | Freq: Every day | ORAL | Status: DC
  Administered 2022-09-20 – 2022-09-21 (×2): 88 ug
  Filled 2022-09-19 (×2): qty 1

## 2022-09-19 MED ORDER — ACETAMINOPHEN 650 MG RE SUPP: 650.0000 mg | Freq: Four times a day (QID) | RECTAL | Status: DC | PRN

## 2022-09-19 MED ORDER — SODIUM CHLORIDE 0.9 % IV SOLN
2.0000 g | Freq: Two times a day (BID) | INTRAVENOUS | Status: DC
  Administered 2022-09-19 – 2022-09-20 (×3): 2 g via INTRAVENOUS
  Filled 2022-09-19 (×3): qty 20

## 2022-09-19 MED ORDER — METOPROLOL TARTRATE 5 MG/5ML IV SOLN
5.0000 mg | Freq: Four times a day (QID) | INTRAVENOUS | Status: DC | PRN
  Administered 2022-09-21: 5 mg via INTRAVENOUS
  Filled 2022-09-19: qty 5

## 2022-09-19 MED ORDER — GABAPENTIN 400 MG PO CAPS
400.0000 mg | ORAL_CAPSULE | Freq: Three times a day (TID) | ORAL | Status: DC
  Administered 2022-09-19: 400 mg via ORAL
  Filled 2022-09-19: qty 1

## 2022-09-19 MED ORDER — PANTOPRAZOLE SODIUM 40 MG IV SOLR
40.0000 mg | INTRAVENOUS | Status: DC
  Administered 2022-09-19 – 2022-09-25 (×7): 40 mg via INTRAVENOUS
  Filled 2022-09-19 (×8): qty 10

## 2022-09-19 MED ORDER — SODIUM CHLORIDE 0.9 % IV SOLN
1500.0000 mg | Freq: Once | INTRAVENOUS | Status: AC
  Administered 2022-09-19: 1500 mg via INTRAVENOUS
  Filled 2022-09-19: qty 30

## 2022-09-19 NOTE — Progress Notes (Signed)
SLP Cancellation Note  Patient Details Name: Michael [name] MRN: [id number] DOB: June 02, 1946   Cancelled treatment:       Reason Eval/Treat Not Completed: Other (comment) (Patient NPO for LP procedure. SLP will return to evaluate swallow function later this date)   Sonia Baller, MA, CCC-SLP Speech Therapy

## 2022-09-19 NOTE — Progress Notes (Signed)
09/19/2022 APP Mickel Baas Gleason and I saw and evaluated the patient. Discussed with them and agree with their findings and plan as documented in the their note.  I have seen and evaluated the patient for hypertensive urgency  S:  42yM with history of HFrecEF, COPD, DM who had a fall 4d pta, later found confused by home health and was BIBEMS 12/20, found remarkably hypertensive BP 220/92 started on cardene, course c/b tonic clonic seizure aborted with ativan, workup with MRI raising possibility of HSV encephalitis.   [facility]  O: Blood pressure 125/75, pulse (!) 119, temperature 98.5 F (36.9 C), temperature source Rectal, resp. rate 17, height 4' 11"  (1.499 m), weight 92 kg, SpO2 99 %.   Exam: Gen:       No acute distress HEENT:  tracking, sclera anicteric Neck:      No masses, JVD Lungs:    Clear to auscultation bilaterally; normal respiratory effort CV:         tachy IR; no murmurs Abd:      + bowel sounds; soft, non-tender; no palpable masses, no distension Ext:    No edema; adequate peripheral perfusion, venous stasis changes over legs Neuro:    follows commands  Labs/imaging reviewed  Mg 1.2  CXR with possible retrocardiac opacity  A:  # Acute encephalopathy - possibly due to viral encephalitis # Hypertensive urgency # Sinus tachycardia with frequent PVCs, PACs # Chronic diastolic heart failure, recovered EF  P:  - give additional magnesium bolus - wean cardene for SBP 160, not able to transition to orals at this point - neuro following, plan for LP, cEEG, continue keppra - continue acyclovir and MIVF - slp eval tomorrow  Patient critically ill due to hypertensive urgency Interventions to address this today oversight of titration of continuous antihypertensive infusion Risk of deterioration without these interventions is high  I personally spent 32 minutes providing critical care not including any separately billable procedures   [place]

## 2022-09-19 NOTE — Progress Notes (Signed)
NAME:  Michael [name], MRN:  [id number], DOB:  1945/10/05, LOS: 1 ADMISSION DATE:  09/18/2022, CONSULTATION DATE:  09/19/22 REFERRING MD: Darrell Jewel  , CHIEF COMPLAINT:   AMS  History of Present Illness:  Michael [name] is a 76 y.o. M with PMH significant for HFpEF, COPD, HL, DM, NASH, chronic lymphedema who presented to [facility] ED with AMS.  He had a fall four days prior to admission and was evaluated in the ED with negative head CT.  Home health went to check on patient and found him confused and called EMS.  He was initially hypertensive with BP 220/92.  While in the ED pt had a tonic-clonic seizure which ceased with Ativan.  Head CT again negative.  An MRI brain was obtained showing equivocal/subtle DWI hyperintensity within the left hippocampus could be consistent with encephalitis, no evidence of PRES.   Neurology was consulted, findings could be seen in the setting of HSV encephalitis or post-ictally in the absence  of infection.   An LP was attempted, but not successful given history of surgery at L4-L5.   Neurology recommended transfer to [facility] for IR to perform LP and for continuous EEG.    His labs were significant for Mag 1.2, no leukocytosis and normal renal function.  He was loaded with Keppra and given Acyclovir and transferred to [facility]  Pertinent  Medical History   has a past medical history of Arthritis, Congestive heart disease ([place]) (11/2015), COPD (chronic obstructive pulmonary disease) ([place]), DDD (degenerative disc disease), lumbosacral, Demand ischemia (09/23/2019), Depression, Diabetic neuropathy ([place]), Diastolic dysfunction, Essential hypertension, GERD (gastroesophageal reflux disease), Hyperlipidemia, Iron deficiency anemia, Left knee DJD, Lumbar spinal stenosis, Lymphedema (11/2015), NASH (nonalcoholic steatohepatitis), Peripheral edema, Peripheral edema (10/05/2015), and Type 2 diabetes mellitus ([place]).   Significant Hospital Events: Including procedures, antibiotic start and stop dates  in addition to other pertinent events   12/20 presented to [facility] ED, had seizure event 12/21 arrived to [facility] on cardene gtt, stable  Interim History / Subjective:  Too agitated to get LP today.  A-fib with RVR, periodically having rates up to the 150s and 160s.  Objective   Blood pressure (!) 145/49, pulse 100, temperature 98.3 F (36.8 C), resp. rate (!) 23, height 4' 11"  (1.499 m), weight 85.7 kg, SpO2 96 %.        Intake/Output Summary (Last 24 hours) at 09/19/2022 0925 Last data filed at 09/19/2022 0900 Gross per 24 hour  Intake 3969.52 ml  Output 400 ml  Net 3569.52 ml    Filed Weights   09/18/22 1017 09/19/22 0600  Weight: 92 kg 85.7 kg   General: Ill-appearing man lying in bed no acute distress HEENT: [place]/AT, eyes anicteric Neuro: Awake, tracking with his eyes.  Very delayed verbal responses, sometimes inappropriate answers.  Can answer some questions.  Moving all extremities. CV: S1-S2, tachycardic, irregular rhythm, A-fib on telemetry.   PULM: Breathing comfortably on nasal cannula. GI: Obese, soft, nontender Extremities: Pitting edema bilateral lower extremities, no cyanosis Skin: No rashes, warm and dry  WBC 7.2 H/H13.2/40.3 Platelets 297 B-12 168  EKG personally reviewed-Afib, LAF block.   Covid & flu negative  No blood cultures collected at admission  Resolved Hospital Problem list     Assessment & Plan:   Acute Encephalopathy; possibly post-concussive after a fall several days ago. Concerning for acute encephalitis.  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  Appreciate their recommendations - Escalating antimicrobial regimen.  Continue acyclovir.  Adding steroids, ceftriaxone, vancomycin, ampicillin. -Needs LP, too agitated today to get this despite Ativan.  Will start Precedex to facilitate repeat  attempts. - Needs meningoencephalitis panel, cell count with differential, cultures, glucose, fluid protein level -Seizure precautions, Ativan as needed - B12 supplementation -Starting Precedex to facilitate procedures and allow EEG monitor state place.  Hypertensive emergency HFpEF, chronic - Recovered EF, last 55% HLD -Continue home Cardene - Adding IV metoprolol for sustained heart rates greater than 120 - Needs NG tube for oral meds -Tele monitoring  Afib with RVR -Needs NG tube for metoprolol - Monitor on telemetry - IV metoprolol as needed -Monitor electrolytes and replete as needed; 2 additional grams of magnesium  Hypomagnesemia -Recheck BMP - Additional magnesium repletion  Hypothyroidism; TSH WNL -Synthroid per tube   Type 2 DM, hyperglycemia -SSI PRN -goal BG 140-180 -levemir 10 units daily -With steroids is likely to need more insulin  Chronic lymphedema Chronic pain -Needs NG tube placed, need to resume PTA gabapentin to prevent additional seizures or withdrawal symptoms - Agree with holding oxycodone for now    Best Practice (right click and "Reselect all SmartList Selections" daily)   Diet/type: NPO pending speech eval DVT prophylaxis: prophylactic heparin  GI prophylaxis: N/A Lines: N/A Foley:  N/A Code Status:  full code Last date of multidisciplinary goals of care discussion [pending]  Labs   CBC: Recent Labs  Lab 09/14/22 1458 09/18/22 1021 09/19/22 0639  WBC 5.0 4.3 7.2  NEUTROABS 3.8  --   --   HGB 13.5 14.1 13.2  HCT 40.6 44.8 40.3  MCV 81.9 84.8 82.2  PLT 265 292 297     Basic Metabolic Panel: Recent Labs  Lab 09/14/22 1458 09/18/22 1021 09/19/22 0640  NA 138 135  --   K 3.7 3.9  --   CL 100 98  --   CO2 30 25  --   GLUCOSE 62* 248*  --   BUN 8 9  --   CREATININE 0.63 0.75  --   CALCIUM 8.5* 8.3*  --   MG  --  1.2* 1.8    GFR: Estimated Creatinine Clearance: 71 mL/min (by C-G formula based on SCr of 0.75  mg/dL). Recent Labs  Lab 09/14/22 1458 09/18/22 1021 09/19/22 0639  WBC 5.0 4.3 7.2     Liver Function Tests: Recent Labs  Lab 09/14/22 1458 09/18/22 1021  AST 19 22  ALT 11 16  ALKPHOS 72 77  BILITOT 0.6 0.4  PROT 6.3* 6.9  ALBUMIN 3.4* 3.7    No results for input(s): "LIPASE", "AMYLASE" in the last 168 hours. Recent Labs  Lab 09/18/22 1040  AMMONIA 32    This patient is critically ill with multiple organ system failure which requires frequent high complexity decision making, assessment, support, evaluation, and titration of therapies. This was completed through the application of advanced monitoring technologies and extensive interpretation of multiple databases. During this encounter critical care time was devoted to patient care services described in this note for 45 minutes.  Julian Hy, DO 09/19/22 11:23 AM [facility]  For contact information, see Amion. If no response to pager, please call PCCM consult pager. After hours, 7PM- 7AM, please call Elink.

## 2022-09-19 NOTE — ED Notes (Signed)
Pt's clothes were left behind. Pt's belongs placed in Lost and found labeled with pt's name

## 2022-09-19 NOTE — Consult Note (Signed)
Neurology Consultation Reason for Consult: Altered mental status Referring Physician: [place] B Zierle-Ghosh  CC: Altered mental status  History is obtained from: Chart review  HPI: Michael [name] is a 76 y.o. male with a history of diabetes, hypertension who presents with altered mental status.  He lives at home but has a home health aide who checked on him today and found him to be very confused.  He fell 4 days ago and was seen with negative CT.  At that time, he had fallen and his medical alert device did not work so he had to sit on his floor for 4 hours.  He did hit his head at the time, and was complaining of headache at that time.  On arrival, he was noted to be very confused, moving all extremities.  He was seen to have a generalized tonic-clonic seizure which started with right facial twitching and aborted with Ativan.  MRI brain was obtained which does show some restricted diffusion in the left hippocampus.   Past Medical History:  Diagnosis Date   Arthritis    Congestive heart disease ([place]) 11/2015   COPD (chronic obstructive pulmonary disease) (HCC)    DDD (degenerative disc disease), lumbosacral    Demand ischemia 09/23/2019   Depression    Diabetic neuropathy (HCC)    Diastolic dysfunction    Essential hypertension    GERD (gastroesophageal reflux disease)    Hyperlipidemia    Iron deficiency anemia    Left knee DJD    Lumbar spinal stenosis    Lymphedema 11/2015   BOTH LEGS   NASH (nonalcoholic steatohepatitis)    Peripheral edema    Peripheral edema 10/05/2015   Type 2 diabetes mellitus ([place])      Family History  Problem Relation Age of Onset   Heart disease Mother    Hyperlipidemia Mother    Hypertension Mother    Depression Mother    Diabetes Mother    Rectal cancer Mother    Cancer Mother    Heart disease Father    Hypertension Father    Hyperlipidemia Father    Diabetes Father    Cancer Father    Heart disease Sister    Hyperlipidemia Sister     Hypertension Sister    Diabetes Sister    Diabetes Brother    Heart disease Sister    Hyperlipidemia Sister    Hypertension Sister    Heart disease Sister    Hyperlipidemia Sister    Hypertension Sister    Diabetes Sister    Diabetes Brother      Social History:  reports that he quit smoking about 22 years ago. His smoking use included cigarettes. He started smoking about 67 years ago. He has a 110.00 pack-year smoking history. He has never used smokeless tobacco. He reports that he does not drink alcohol and does not use drugs.   Exam: Current vital signs: BP 125/75   Pulse (!) 119   Temp 98.5 F (36.9 C) (Rectal)   Resp 17   Ht 4' 11"  (1.499 m)   Wt 92 kg   SpO2 99%   BMI 40.97 kg/m  Vital signs in last 24 hours: Temp:  [96.9 F (36.1 C)-98.5 F (36.9 C)] 98.5 F (36.9 C) (12/20 2351) Pulse Rate:  [48-146] 119 (12/21 0153) Resp:  [16-30] 17 (12/21 0145) BP: (112-226)/(60-135) 125/75 (12/21 0153) SpO2:  [88 %-100 %] 99 % (12/21 0153) Weight:  [92 kg] 92 kg (12/20 1017)   Physical Exam  Neuro: Mental Status: He is awake, knows his name, unable to give month or year and when asked where he is, he gives a mumbled response.  Subsequently Cranial Nerves: II: He does not blink to threat from either direction but does fixate and track. Pupils are equal, round, and reactive to light.   III,IV, VI: He has a right gaze preference, he will intermittently look to the right but immediately is able to be fixate on me when I am seeing on the left side. VII: I question a very mild flattening of the left nasolabial fold Motor: He is able to hold all extremities aloft without drift  sensory: He respondse to noxious stimulation bilaterally  Cerebellar: Does not perform     I have reviewed labs in epic and the results pertinent to this consultation are: Sodium 135 Calcium 8.3 Magnesium 1.2(has received 4 g of magnesium since this) Ammonia 32 Urinalysis is  negative   I have reviewed the images obtained: MRI brain-restricted diffusion in the left hippocampus  Impression: 76 year old male with altered mental status and witnessed seizure.  I agree with decision to empirically start acyclovir given his findings and new onset seizures.  He has been afebrile, but this can be seen early in herpes encephalitis.  It is possible that this simply represents changes associated with ongoing seizure, and he needs continuous EEG monitoring.  With his looking to the right intermittently, I am concerned for possible ongoing seizure activity, but will treat based on EEG.  Recommendations: 1) continue Keppra 500 mg twice daily for now 2) LTM EEG 3) LP for cells, protein, glucose, culture, meningoencephalitis panel (bedside was attempted at [facility], will need to be done under fluoroscopy I have ordered this.) 4) agree with magnesium correction  5) further antiepileptics pending EEG   Roland Rack, MD [facility] [phone]  If 7pm- 7am, please page neurology on call as listed in [facility].

## 2022-09-19 NOTE — Progress Notes (Signed)
[facility] Progress Note Patient Name: Michael [name] DOB: 01-24-1946 MRN: [id number]   Date of Service  09/19/2022  HPI/Events of Note  47 M hx of COPD, combined HF EF 50%, DM (A1C 6.3), presented after a fall with MAS. BP 220/92 placed on cardene. Witnessed seizures. MRI with concern for HSV encephalitis.  Seen undergoing EEG  eICU Interventions  Viral encephalitis on acyclovir pending LP Hypertensive emergency on cardene drip     Intervention Category Evaluation Type: New Patient Evaluation  [name] 09/19/2022, 4:27 AM

## 2022-09-19 NOTE — Procedures (Addendum)
Patient Name: Michael [name]  MRN: [id number]  Epilepsy Attending: Lora Havens  Referring Physician/Provider: Greta Doom, MD  Duration: 09/19/2022 0401 to 09/20/2022 0401  Patient history:  76 year old male with altered mental status and witnessed seizure. EEG to evaluate for seizure.  Level of alertness:  lethargic   AEDs during EEG study: LEV, PHT, GBP  Technical aspects: This EEG study was done with scalp electrodes positioned according to the 10-20 International system of electrode placement. Electrical activity was reviewed with band pass filter of 1-70Hz , sensitivity of 7 uV/mm, display speed of 54m/sec with a 60Hz  notched filter applied as appropriate. EEG data were recorded continuously and digitally stored.  Video monitoring was available and reviewed as appropriate.  Description: EEG showed continuous generalized 3-6 hz theta-delta slowing.  Lateralized periodic discharges were noted in left hemisphere, maximal left posterior quadrant with overlying rhythmicity at 0.5-1Hz .  Hyperventilation and photic stimulation were not performed.     ABNORMALITY -Lateralized periodic discharges with overlying rhythmicity, left hemisphere, maximal posterior quadrant ( LPD+R) - Continuous slow, generalized   IMPRESSION: This study showed evidence of epileptogenicity arising from left hemisphere, maximal left posterior quadrant. This EEG pattern is on the ictal-interictal continuum with increased risk of seizures. Additionally there is moderate to severe diffuse encephalopathy, nonspecific etiology.  Dr. KLorrin Goodellwas notified.   Michael [name]

## 2022-09-19 NOTE — H&P (Signed)
NAME:  Michael [name], MRN:  [id number], DOB:  1946-01-26, LOS: 1 ADMISSION DATE:  09/18/2022, CONSULTATION DATE:  09/19/22 REFERRING MD: Darrell Jewel  , CHIEF COMPLAINT:   AMS  History of Present Illness:  Michael [name] is a 76 y.o. M with PMH significant for HFpEF, COPD, HL, DM, NASH, chronic lymphedema who presented to [facility] ED with AMS.  He had a fall four days prior to admission and was evaluated in the ED with negative head CT.  Home health went to check on patient and found him confused and called EMS.  He was initially hypertensive with BP 220/92.  While in the ED pt had a tonic-clonic seizure which ceased with Ativan.  Head CT again negative.  An MRI brain was obtained showing equivocal/subtle DWI hyperintensity within the left hippocampus could be consistent with encephalitis, no evidence of PRES.   Neurology was consulted, findings could be seen in the setting of HSV encephalitis or post-ictally in the absence  of infection.   An LP was attempted, but not successful given history of surgery at L4-L5.   Neurology recommended transfer to [facility] for IR to perform LP and for continuous EEG.    His labs were significant for Mag 1.2, no leukocytosis and normal renal function.  He was loaded with Keppra and given Acyclovir and transferred to [facility]  Pertinent  Medical History   has a past medical history of Arthritis, Congestive heart disease ([place]) (11/2015), COPD (chronic obstructive pulmonary disease) ([place]), DDD (degenerative disc disease), lumbosacral, Demand ischemia (09/23/2019), Depression, Diabetic neuropathy ([place]), Diastolic dysfunction, Essential hypertension, GERD (gastroesophageal reflux disease), Hyperlipidemia, Iron deficiency anemia, Left knee DJD, Lumbar spinal stenosis, Lymphedema (11/2015), NASH (nonalcoholic steatohepatitis), Peripheral edema, Peripheral edema (10/05/2015), and Type 2 diabetes mellitus ([place]).   Significant Hospital Events: Including procedures, antibiotic start and stop dates  in addition to other pertinent events   12/20 presented to [facility] ED, had seizure event 12/21 arrived to [facility] on cardene gtt, stable  Interim History / Subjective:  Pt arrived to [facility] hemodynamically stable and awake but confused, on cardene gtt  Objective   Blood pressure 125/75, pulse (!) 119, temperature 98.5 F (36.9 C), temperature source Rectal, resp. rate 17, height 4' 11"  (1.499 m), weight 92 kg, SpO2 99 %.        Intake/Output Summary (Last 24 hours) at 09/19/2022 0214 Last data filed at 09/19/2022 0151 Gross per 24 hour  Intake 2116.79 ml  Output --  Net 2116.79 ml   Filed Weights   09/18/22 1017  Weight: 92 kg   General:  elderly M, resting in bed, mildly restless but in no acute distress HEENT: MM pink/moist, sclera anicteric, pupils equal Neuro: awake, disoriented x3 but answering questions and following commands, pupils equal and responsive to light  CV: s1s2 tachycardic, irregular, no m/r/g PULM:  clear bilaterally on RA without rhonchi or wheezing GI: soft, non-distended  Extremities: warm/dry, bilateral pitting edema with chronic-appearing ruddy edema   Skin: no rashes or lesions   Resolved Hospital Problem list     Assessment & Plan:    Acute Encephalopathy 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  -Transfer to [facility], neurology consulted for likely LTM EEG -seizure precautions, prn Ativan -IR consult in the AM for LP -continue Acyclovir -loaded with Keppra, prn ativan -seizure precautions -no history of ETOH use -check b12 level   Hypertensive Emergency HFpEF HL Recovered  EF, last 55% -cardene gtt, add home oral medications when patient can safely swallow -continue Entresto and statin when cleared for po's   Hypomagnesemia Mag 1.2  -received 2g IV, give an additional 2g now and repeat level in the  AM   Hypothyroidism -continue synthroid and check TSH   Type 2 DM -SSI   Chronic lymphedema Chronic pain -hold home Neurontin and oxycodone for now      Best Practice (right click and "Reselect all SmartList Selections" daily)   Diet/type: NPO pending speech eval DVT prophylaxis: prophylactic heparin  GI prophylaxis: N/A Lines: N/A Foley:  N/A Code Status:  full code Last date of multidisciplinary goals of care discussion [pending]  Labs   CBC: Recent Labs  Lab 09/14/22 1458 09/18/22 1021  WBC 5.0 4.3  NEUTROABS 3.8  --   HGB 13.5 14.1  HCT 40.6 44.8  MCV 81.9 84.8  PLT 265 315    Basic Metabolic Panel: Recent Labs  Lab 09/14/22 1458 09/18/22 1021  NA 138 135  K 3.7 3.9  CL 100 98  CO2 30 25  GLUCOSE 62* 248*  BUN 8 9  CREATININE 0.63 0.75  CALCIUM 8.5* 8.3*  MG  --  1.2*   GFR: Estimated Creatinine Clearance: 73.8 mL/min (by C-G formula based on SCr of 0.75 mg/dL). Recent Labs  Lab 09/14/22 1458 09/18/22 1021  WBC 5.0 4.3    Liver Function Tests: Recent Labs  Lab 09/14/22 1458 09/18/22 1021  AST 19 22  ALT 11 16  ALKPHOS 72 77  BILITOT 0.6 0.4  PROT 6.3* 6.9  ALBUMIN 3.4* 3.7   No results for input(s): "LIPASE", "AMYLASE" in the last 168 hours. Recent Labs  Lab 09/18/22 1040  AMMONIA 32    ABG    Component Value Date/Time   PHART 7.473 (H) 09/22/2019 1410   PCO2ART 52.3 (H) 09/22/2019 1410   PO2ART 90.0 09/22/2019 1410   HCO3 37.5 (H) 09/22/2019 1415   TCO2 39 (H) 09/22/2019 1415   O2SAT 68.0 09/22/2019 1415     Coagulation Profile: No results for input(s): "INR", "PROTIME" in the last 168 hours.  Cardiac Enzymes: Recent Labs  Lab 09/14/22 1458  CKTOTAL 247    HbA1C: Hgb A1C (fingerstick)  Date/Time Value Ref Range Status  11/07/2015 11:30 AM 6.4 (H) <5.7 % Final    Comment:                                                                           According to the ADA Clinical Practice Recommendations  for 2011, when HbA1c is used as a screening test:     >=6.5%   Diagnostic of Diabetes Mellitus            (if abnormal result is confirmed)   5.7-6.4%   Increased risk of developing Diabetes Mellitus   References:Diagnosis and Classification of Diabetes Mellitus,Diabetes QMGQ,6761,95(KDTOI 1):S62-S69 and Standards of Medical Care in         Diabetes - 2011,Diabetes ZTIW,5809,98 (Suppl 1):S11-S61.     04/24/2015 12:49 PM 6.7 (H) <5.7 % Final    Comment:  According to the ADA Clinical Practice Recommendations for 2011, when HbA1c is used as a screening test:     >=6.5%   Diagnostic of Diabetes Mellitus            (if abnormal result is confirmed)   5.7-6.4%   Increased risk of developing Diabetes Mellitus   References:Diagnosis and Classification of Diabetes Mellitus,Diabetes TSVX,7939,03(ESPQZ 1):S62-S69 and Standards of Medical Care in         Diabetes - 2011,Diabetes RAQT,6226,33 (Suppl 1):S11-S61.      Hemoglobin A1C  Date/Time Value Ref Range Status  08/08/2016 12:40 PM 6.2  Preliminary    Comment:    h   Hgb A1c MFr Bld  Date/Time Value Ref Range Status  06/20/2022 09:53 AM 6.3 (H) 4.8 - 5.6 % Final    Comment:             Prediabetes: 5.7 - 6.4          Diabetes: >6.4          Glycemic control for adults with diabetes: <7.0   12/21/2021 10:54 AM 7.7 (H) 4.8 - 5.6 % Final    Comment:             Prediabetes: 5.7 - 6.4          Diabetes: >6.4          Glycemic control for adults with diabetes: <7.0     CBG: Recent Labs  Lab 09/18/22 1017 09/18/22 2151  GLUCAP 226* 169*    Review of Systems:   Unable to obtain secondary to AMS  Past Medical History:  He,  has a past medical history of Arthritis, Congestive heart disease ([place]) (11/2015), COPD (chronic obstructive pulmonary disease) ([place]), DDD (degenerative disc disease), lumbosacral, Demand ischemia (09/23/2019), Depression,  Diabetic neuropathy ([place]), Diastolic dysfunction, Essential hypertension, GERD (gastroesophageal reflux disease), Hyperlipidemia, Iron deficiency anemia, Left knee DJD, Lumbar spinal stenosis, Lymphedema (11/2015), NASH (nonalcoholic steatohepatitis), Peripheral edema, Peripheral edema (10/05/2015), and Type 2 diabetes mellitus ([place]).   Surgical History:   Past Surgical History:  Procedure Laterality Date   BIOPSY N/A 07/21/2014   Procedure: BIOPSY;  Surgeon: Daneil Dolin, MD;  Location: [facility];  Service: Endoscopy;  Laterality: N/A;   CATARACT EXTRACTION W/PHACO Right 07/08/2016   Procedure: CATARACT EXTRACTION PHACO AND INTRAOCULAR LENS PLACEMENT RIGHT EYE;  Surgeon: Tonny Branch, MD;  Location: [facility];  Service: Ophthalmology;  Laterality: Right;  CDE: 9.01   CATARACT EXTRACTION W/PHACO Left 07/22/2016   Procedure: CATARACT EXTRACTION PHACO AND INTRAOCULAR LENS PLACEMENT LEFT EYE CDE=10.69;  Surgeon: Tonny Branch, MD;  Location: [facility];  Service: Ophthalmology;  Laterality: Left;  left -    COLONOSCOPY  2011   [facility]: normal colon, normal distal ileum   COLONOSCOPY WITH PROPOFOL N/A 07/21/2014   Procedure: ATTEMPTED COLONOSCOPY WITH PROPOFOL-HAD TO STOP DUE TO BRADYCARDIA;  Surgeon: Daneil Dolin, MD;  Location: [facility];  Service: Endoscopy;  Laterality: N/A;   EGD with enteroscopy  2011   [facility]: normal esophagus and stomach. Normal duodenum, jejunum. No evidence of AVMs.    ESOPHAGOGASTRODUODENOSCOPY (EGD) WITH PROPOFOL N/A 07/21/2014   Procedure: ESOPHAGOGASTRODUODENOSCOPY (EGD) WITH PROPOFOL;  Surgeon: Daneil Dolin, MD;  Location: [facility];  Service: Endoscopy;  Laterality: N/A;   HERNIA REPAIR     KNEE ARTHROSCOPY WITH MEDIAL MENISECTOMY Left 11/06/2012   Procedure: KNEE ARTHROSCOPY WITH MEDIAL MENISECTOMY;  Surgeon: Carole Civil, MD;  Location: [facility];  Service: Orthopedics;  Laterality:  Left;   Lipoma removal     Stomach   LUMBAR LAMINECTOMY/DECOMPRESSION MICRODISCECTOMY Left  08/30/2013   Procedure: LUMBAR LAMINECTOMY/DECOMPRESSION MICRODISCECTOMY LEFT  LUMBAR TWO THREE;  Surgeon: Otilio Connors, MD;  Location: [facility];  Service: Neurosurgery;  Laterality: Left;   RIGHT/LEFT HEART CATH AND CORONARY ANGIOGRAPHY N/A 09/22/2019   Procedure: RIGHT/LEFT HEART CATH AND CORONARY ANGIOGRAPHY;  Surgeon: Martinique, Peter M, MD;  Location: [facility] CV LAB;  Service: Cardiovascular;  Laterality: N/A;   SHOULDER SURGERY     Rght-rotator cuff   TOOTH EXTRACTION       Social History:   reports that he quit smoking about 22 years ago. His smoking use included cigarettes. He started smoking about 67 years ago. He has a 110.00 pack-year smoking history. He has never used smokeless tobacco. He reports that he does not drink alcohol and does not use drugs.   Family History:  His family history includes Cancer in his father and mother; Depression in his mother; Diabetes in his brother, brother, father, mother, sister, and sister; Heart disease in his father, mother, sister, sister, and sister; Hyperlipidemia in his father, mother, sister, sister, and sister; Hypertension in his father, mother, sister, sister, and sister; Rectal cancer in his mother.   Allergies No Known Allergies   Home Medications  Prior to Admission medications   Medication Sig Start Date End Date Taking? Authorizing Provider  allopurinol (ZYLOPRIM) 100 MG tablet Take 100 mg by mouth daily.   Yes [provider]  aspirin EC 81 MG tablet Take 81 mg by mouth daily.   Yes [provider]  clotrimazole-betamethasone (LOTRISONE) cream Apply 1 Application topically daily. 06/20/22  Yes Lindell Spar, MD  diclofenac sodium (VOLTAREN) 1 % GEL Apply 4 g topically 4 (four) times daily as needed (pain). 03/06/17  Yes [place], Modena Nunnery, MD  gabapentin (NEURONTIN) 400 MG capsule Take 1 capsule (400 mg total) by mouth 3 (three) times daily. 03/04/18  Yes [place], Modena Nunnery, MD  HYDROcodone-acetaminophen  [facility]) 10-325 MG tablet Take 1 tablet by mouth 3 (three) times daily as needed for severe pain. 09/10/22  Yes Lindell Spar, MD  Insulin Glargine (LANTUS SOLOSTAR) 100 UNIT/ML Solostar Pen Inject 5 Units into the skin daily at 10 pm. Patient taking differently: Inject 20 Units into the skin at bedtime. 03/06/18  Yes [place], Modena Nunnery, MD  pantoprazole (PROTONIX) 40 MG tablet Take 1 tablet (40 mg total) by mouth daily. 02/08/20  Yes Branch, Alphonse Guild, MD  pravastatin (PRAVACHOL) 40 MG tablet TAKE ONE TABLET BY MOUTH DAILY. 05/21/16  Yes [place], Modena Nunnery, MD  sacubitril-valsartan (ENTRESTO) 49-51 MG Take 1 tablet by mouth 2 (two) times daily. 02/08/20  Yes Branch, Alphonse Guild, MD  tamsulosin (FLOMAX) 0.4 MG CAPS capsule TAKE (1) CAPSULE BY MOUTH EVERY DAY. Patient taking differently: Take 0.4 mg by mouth daily. 10/10/21  Yes Lindell Spar, MD  terbinafine (LAMISIL) 250 MG tablet TAKE ONE TABLET BY MOUTH ONCE DAILY. 08/20/22  Yes Lindell Spar, MD  traZODone (DESYREL) 100 MG tablet TAKE 1 TABLET BY MOUTH AT BEDTIME AS NEEDED FOR SLEEP. 11/18/18  Yes [place], Modena Nunnery, MD  baclofen (LIORESAL) 10 MG tablet Take 5 mg by mouth 3 (three) times daily as needed. Patient not taking: Reported on 09/18/2022 12/26/20   [provider]  bumetanide (BUMEX) 2 MG tablet Take 1.5 tablets (3 mg total) by mouth 2 (two) times daily. Patient not taking: Reported on 09/18/2022 11/01/19  [name], MD  carvedilol (COREG) 12.5 MG tablet Take 1 tablet (12.5 mg total) by mouth 2 (two) times daily. Patient not taking: Reported on 09/18/2022 12/14/19   [name], MD  Ferrous Sulfate (IRON) 325 (65 FE) MG TABS Take 1 tablet by mouth 2 (two) times daily.    [provider]  insulin glargine-yfgn (SEMGLEE) 100 UNIT/ML Pen INJECT 20 UNITS SUBCUTANEOUSLY AT BEDTIME FOR DIABETES PNP HANDS AND FEET (CONVERTED FROM LANTUS) 06/18/22   [provider]  levothyroxine (SYNTHROID) 88 MCG tablet Take  88 mcg by mouth daily before breakfast.    [provider]  magnesium oxide (MAG-OX) 400 MG tablet Take 1 tablet (400 mg total) by mouth 2 (two) times daily. 11/19/17   Imogene Burn, PA-C  meloxicam (MOBIC) 7.5 MG tablet TAKE ONE TABLET BY MOUTH ONCE DAILY. 08/12/22   Carole Civil, MD  metFORMIN (GLUCOPHAGE) 1000 MG tablet TAKE ONE TABLET BY MOUTH TWICE DAILY WITH A MEAL. 09/24/19   Sande Rives E, PA-C  metolazone (ZAROXOLYN) 2.5 MG tablet Two Times Weekly on Monday and Thursday 02/08/20   [name], MD  nystatin cream (MYCOSTATIN) APPLY TO AFFECTED AREA TWICE DAILY. 03/04/18   [name], MD     Critical care time:  42 minutes     CRITICAL CARE Performed by: Otilio Carpen Karimah Winquist   Total critical care time: 42 minutes  Critical care time was exclusive of separately billable procedures and treating other patients.  Critical care was necessary to treat or prevent imminent or life-threatening deterioration.  Critical care was time spent personally by me on the following activities: development of treatment plan with patient and/or surrogate as well as nursing, discussions with consultants, evaluation of patient's response to treatment, examination of patient, obtaining history from patient or surrogate, ordering and performing treatments and interventions, ordering and review of laboratory studies, ordering and review of radiographic studies, pulse oximetry and re-evaluation of patient's condition.   Otilio Carpen Merrillyn Ackerley, PA-C [facility] See Amion for pager If no response to pager , please call [phone] until 7pm After 7:00 pm call Elink  [phone]

## 2022-09-19 NOTE — Progress Notes (Addendum)
Neurology Plan of Care Note:  Subjective: 76 y.o. male with a history of diabetes, hypertension who presents with altered mental status.  He lives at home but has a home health aide who checked on him today and found him to be very confused.  He fell 4 days ago and was seen with negative CT.   He was seen to have a generalized tonic-clonic seizure which started with right facial twitching and aborted with Ativan. He was loaded with Keppra 1054m IV. 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 He also has be hypertensive requiring cardene drip.      Objective: Patient is laying in bed on LTM EEG, very restless and confused. He is moving everything antigravity, On cardene gtt @ 13 mg.  LTM EEG 12/21 0401-0900: -Lateralized periodic discharges with overlying rhythmicity, left hemisphere, maximal posterior quadrant ( LPD+R); Continuous slow, generalized. This study showed evidence of epileptogenicity arising from left hemisphere, maximal left posterior quadrant. This EEG pattern is on the ictal-interictal continuum with increased risk of seizures. Additionally there is moderate to severe diffuse encephalopathy, nonspecific etiology. Patient was loaded fosphenytoin 15098mPE this am   Assessment/Plan   Temp:  [97.8 F (36.6 C)-98.5 F (36.9 C)] 98.3 F (36.8 C) (12/21 0700) Pulse Rate:  [48-146] 113 (12/21 1000) Resp:  [15-30] 21 (12/21 1000) BP: (107-199)/(49-135) 133/82 (12/21 1000) SpO2:  [90 %-100 %] 96 % (12/21 1000) Weight:  [85.7 kg] 85.7 kg (12/21 0600)  General - Well nourished, well developed, in no apparent distress. Cardiovascular - Regular rhythm and rate.  Mental Status -  Patient is awake and alert. He is restless and confused. He is able to state his name and date of birth. Unable to answer any other orientation questions. Poor concentration, and at times hard to refocus.   Cranial Nerves II - XII  - II - Visual field intact OU. III, IV, VI - right gaze preference but can cross midline, with nystagmus on right gaze V - Facial sensation intact bilaterally. VII - Facial movement intact bilaterally. VIII - Hearing & vestibular intact bilaterally. X - Palate elevates symmetrically. XI - Chin turning & shoulder shrug intact bilaterally. XII - Tongue protrusion intact. Motor Strength - moves all extremities antigravity Sensory - Light touch, temperature/pinprick were assessed and were symmetrical.   Coordination - unable to assess Gait and Station - deferred.  Recommendations:  Continue Keppra  Continue abx coverage for meningitis LP under fluoro  CSF labs: LP for cells, protein, glucose, culture, meningoencephalitis panel   DeBeulah GandyNP, ACNPC-AG  [facility]  NEUROHOSPITALIST ADDENDUM Performed a face to face diagnostic evaluation.   I have reviewed the contents of history and physical exam as documented by PA/ARNP/Resident and agree with above documentation.  I have discussed and formulated the above plan as documented. Edits to the note have been made as needed.  Impression/Key exam findings/Plan: 57M p/w seizures. MRI with hippocampus DWI changes. LTM with left PLEDs. Exam with sensory and expressive aphasia. Loaded with Keppra and on Keppra 5007mID. I noted aphasia on my exam but my NP saw him after me and after Fos Phenytoin load was given with no further Aphasia.  - Plan to keep him on LTM overnight and increase Keppra to 1000m5mD. - LP under fluoro is pending. Failed LP at bedside. - Recommend empiric meningitis coverage with Vanc, ceftriaxone, Ampicillin and Acyclovir.  SalmDonnetta Simpers [facility] [phone]  If 7pm to 7am, please call on call as listed on AMION.

## 2022-09-19 NOTE — TOC CM/SW Note (Signed)
..   Transition of Care [facility]) Screening Note   Patient Details  Name: Michael [name] Date of Birth: 01-13-1946   [facility]) CM/SW Contact:    Erenest Rasher, RN Phone Number: 09/19/2022, 2:45 PM    Transition of Care Department [facility]) has reviewed patient. We will continue to monitor patient advancement through interdisciplinary progression rounds. Pt lives at home alone and has aide that comes daily. Will need PT/OT evaluation for possible SNF rehab. Will continue to follow for dc needs.

## 2022-09-19 NOTE — Progress Notes (Signed)
SLP Cancellation Note  Patient Details Name: KUNAL LEVARIO MRN: [id number] DOB: 1946-06-01   Cancelled treatment:       Reason Eval/Treat Not Completed: Medical issues which prohibited therapy;Patient's level of consciousness. Patient now on Precedex for agitation, has NG, RN advised to check in tomorrow for readiness for swallow eval.  Sonia Baller, MA, CCC-SLP Speech Therapy

## 2022-09-19 NOTE — Progress Notes (Signed)
Patient presented to the Radiology department for an image-guided lumbar puncture. The patient's ICU nurse accompanied him and administered PRN ativan after the patient was on the fluoroscopy table. The patient remained restless and unable to lay still on the table. Multiple unsuccessful attempts were made to help the patient get comfortable and relaxed on the table.  After approximately 10 minutes the team made the decision to abort the procedure. The patient was returned to his room in the ICU and the ordering provider was made aware.  Soyla Dryer, [place] [phone] 09/19/2022, 10:57 AM

## 2022-09-19 NOTE — Progress Notes (Signed)
Pharmacy Antibiotic Note  Michael [name] is a 76 y.o. male admitted on 09/18/2022 with  rule-out meningitis .  Pharmacy has been consulted for Vancomycin and acyclovir dosing.  Was started on acyclovir yesterday. WBC 7.2, afebrile. Scr 0.9 today (CrCl 63 mL/min). Plan for LP - will re-attempt, was unable to lay still on table.   Plan: Continue acyclovir 670 mg IV every 8 hours (NS @125mg /hr ordered per fluid replacement in protocol) Start vancomycin 1750 mg IV once then 1g IV every 12 hours Start ceftriaxone 2g IV every 12 hours in addition to ampicillin 2g IV every 4 hours Monitor renal fx, cx results, clinical pic, and LP results   Height: 4' 11"  (149.9 cm) Weight: 85.7 kg (188 lb 15 oz) IBW/kg (Calculated) : 47.7  Temp (24hrs), Avg:98.1 F (36.7 C), Min:97.8 F (36.6 C), Max:98.5 F (36.9 C)  Recent Labs  Lab 09/14/22 1458 09/18/22 1021 09/19/22 0639  WBC 5.0 4.3 7.2  CREATININE 0.63 0.75  --     Estimated Creatinine Clearance: 71 mL/min (by C-G formula based on SCr of 0.75 mg/dL).    No Known Allergies  Antimicrobials this admission: Acyclovir 12/20 >>  Ceftriaxone 12/21 >>  Vancomycin 12/21>> Ampicillin 12/21 >>  Dose adjustments this admission: N/A  Microbiology results: 12/21 BCx: sent  12/20 COVID/Flu/RSV PCR: neg  12/21 MRSA PCR: neg  Thank you for allowing pharmacy to be a part of this patient's care.  Antonietta Jewel, PharmD, [place] Clinical Pharmacist  Phone: [phone] 09/19/2022 11:30 AM  Please check AMION for all [place] phone numbers After 10:00 PM, call [place] [phone]

## 2022-09-19 NOTE — Progress Notes (Incomplete)
LTM EEG hooked up and running - no initial skin breakdown - push button tested - [facility] monitoring.

## 2022-09-20 ENCOUNTER — Inpatient Hospital Stay (HOSPITAL_COMMUNITY): Payer: Medicare Other

## 2022-09-20 ENCOUNTER — Inpatient Hospital Stay: Payer: Self-pay

## 2022-09-20 DIAGNOSIS — G9340 Encephalopathy, unspecified: Secondary | ICD-10-CM

## 2022-09-20 DIAGNOSIS — I4811 Longstanding persistent atrial fibrillation: Secondary | ICD-10-CM

## 2022-09-20 DIAGNOSIS — I169 Hypertensive crisis, unspecified: Secondary | ICD-10-CM | POA: Diagnosis not present

## 2022-09-20 DIAGNOSIS — R569 Unspecified convulsions: Secondary | ICD-10-CM | POA: Diagnosis not present

## 2022-09-20 DIAGNOSIS — R410 Disorientation, unspecified: Secondary | ICD-10-CM | POA: Diagnosis not present

## 2022-09-20 LAB — MENINGITIS/ENCEPHALITIS PANEL (CSF)

## 2022-09-20 LAB — BASIC METABOLIC PANEL
Anion gap: 13 (ref 5–15)
BUN: 13 mg/dL (ref 8–23)
CO2: 25 mmol/L (ref 22–32)
Calcium: 7.7 mg/dL — ABNORMAL LOW (ref 8.9–10.3)
Chloride: 97 mmol/L — ABNORMAL LOW (ref 98–111)
Creatinine, Ser: 0.98 mg/dL (ref 0.61–1.24)
GFR, Estimated: >60 mL/min (ref >60)
Glucose, Bld: 192 mg/dL — ABNORMAL HIGH (ref 70–99)
Potassium: 3.6 mmol/L (ref 3.5–5.1)
Sodium: 135 mmol/L (ref 135–145)

## 2022-09-20 LAB — CSF CELL COUNT WITH DIFFERENTIAL
RBC Count, CSF: 1 /mm3 — ABNORMAL HIGH
Tube #: 3
WBC, CSF: 0 /mm3 (ref 0–5)

## 2022-09-20 LAB — PROTEIN AND GLUCOSE, CSF
Glucose, CSF: 86 mg/dL — ABNORMAL HIGH (ref 40–70)
Total  Protein, CSF: 70 mg/dL — ABNORMAL HIGH (ref 15–45)

## 2022-09-20 LAB — GLUCOSE, CAPILLARY
Glucose-Capillary: 193 mg/dL — ABNORMAL HIGH (ref 70–99)
Glucose-Capillary: 169 mg/dL — ABNORMAL HIGH (ref 70–99)
Glucose-Capillary: 125 mg/dL — ABNORMAL HIGH (ref 70–99)
Glucose-Capillary: 107 mg/dL — ABNORMAL HIGH (ref 70–99)

## 2022-09-20 MED ORDER — CARVEDILOL 3.125 MG PO TABS: 3.1250 mg | ORAL_TABLET | Freq: Two times a day (BID) | ORAL | Status: DC

## 2022-09-20 MED ORDER — LEVETIRACETAM IN NACL 1000 MG/100ML IV SOLN
1000.0000 mg | Freq: Two times a day (BID) | INTRAVENOUS | Status: DC
  Administered 2022-09-20 – 2022-09-22 (×5): 1000 mg via INTRAVENOUS
  Filled 2022-09-20 (×6): qty 100

## 2022-09-20 MED ORDER — LIDOCAINE HCL (PF) 1 % IJ SOLN
5.0000 mL | Freq: Once | INTRAMUSCULAR | Status: AC
  Administered 2022-09-20: 5 mL via INTRADERMAL

## 2022-09-20 MED ORDER — CARVEDILOL 3.125 MG PO TABS
3.1250 mg | ORAL_TABLET | Freq: Two times a day (BID) | ORAL | Status: DC
  Administered 2022-09-20: 3.125 mg
  Filled 2022-09-20: qty 1

## 2022-09-20 MED ORDER — HYDRALAZINE HCL 20 MG/ML IJ SOLN
10.0000 mg | INTRAMUSCULAR | Status: AC | PRN
  Administered 2022-09-20 – 2022-09-21 (×2): 10 mg via INTRAVENOUS
  Filled 2022-09-20 (×3): qty 1

## 2022-09-20 MED ORDER — CARVEDILOL 12.5 MG PO TABS
12.5000 mg | ORAL_TABLET | Freq: Two times a day (BID) | ORAL | Status: DC
  Administered 2022-09-21: 12.5 mg
  Filled 2022-09-20: qty 1

## 2022-09-20 NOTE — Progress Notes (Signed)
CSF studies reviewed-- with 0 WBC can stop broad antibiotics and steroids Keep acyclovir.  Transferring to the floor, TRH to assume care tomorrow.  Julian Hy, DO 09/20/22 5:34 PM [facility]  For contact information, see Amion. If no response to pager, please call PCCM consult pager. After hours, 7PM- 7AM, please call Elink.

## 2022-09-20 NOTE — Progress Notes (Signed)
LTM EEG discontinued - no skin breakdown at unhook.   

## 2022-09-20 NOTE — Progress Notes (Signed)
NAME:  Michael [name], MRN:  [id number], DOB:  Jan 30, 1946, LOS: 2 ADMISSION DATE:  09/18/2022, CONSULTATION DATE:  09/20/22 REFERRING MD: Darrell Jewel  , CHIEF COMPLAINT:   AMS  History of Present Illness:  Michael [name] is a 76 y.o. M with PMH significant for HFpEF, COPD, HL, DM, NASH, chronic lymphedema who presented to [facility] ED with AMS.  He had a fall four days prior to admission and was evaluated in the ED with negative head CT.  Home health went to check on patient and found him confused and called EMS.  He was initially hypertensive with BP 220/92.  While in the ED pt had a tonic-clonic seizure which ceased with Ativan.  Head CT again negative.  An MRI brain was obtained showing equivocal/subtle DWI hyperintensity within the left hippocampus could be consistent with encephalitis, no evidence of PRES.   Neurology was consulted, findings could be seen in the setting of HSV encephalitis or post-ictally in the absence  of infection.   An LP was attempted, but not successful given history of surgery at L4-L5.   Neurology recommended transfer to [facility] for IR to perform LP and for continuous EEG.    His labs were significant for Mag 1.2, no leukocytosis and normal renal function.  He was loaded with Keppra and given Acyclovir and transferred to [facility]  Pertinent  Medical History   has a past medical history of Arthritis, Congestive heart disease ([place]) (11/2015), COPD (chronic obstructive pulmonary disease) ([place]), DDD (degenerative disc disease), lumbosacral, Demand ischemia (09/23/2019), Depression, Diabetic neuropathy ([place]), Diastolic dysfunction, Essential hypertension, GERD (gastroesophageal reflux disease), Hyperlipidemia, Iron deficiency anemia, Left knee DJD, Lumbar spinal stenosis, Lymphedema (11/2015), NASH (nonalcoholic steatohepatitis), Peripheral edema, Peripheral edema (10/05/2015), and Type 2 diabetes mellitus ([place]).   Significant Hospital Events: Including procedures, antibiotic start and stop dates  in addition to other pertinent events   12/20 presented to [facility] ED, had seizure event 12/21 arrived to [facility] on cardene gtt, stable  Interim History / Subjective:  More calm on precedex. LP later today.  Objective   Blood pressure 120/62, pulse 61, temperature 97.6 F (36.4 C), temperature source Oral, resp. rate (!) 6, height 4' 11"  (1.499 m), weight 87.3 kg, SpO2 98 %.        Intake/Output Summary (Last 24 hours) at 09/20/2022 1138 Last data filed at 09/20/2022 1000 Gross per 24 hour  Intake 4463.8 ml  Output 725 ml  Net 3738.8 ml    Filed Weights   09/18/22 1017 09/19/22 0600 09/20/22 0500  Weight: 92 kg 85.7 kg 87.3 kg   General: ill appearing man lying in bed in NAD HEENT: [place]/AT, eyes anicteric. Cortrak in place.  Neuro: On precedex, sleeping but arouses to verbal stimulation. Answering some questions with short phrases, but not answering all questions. Follows simple commands in all extremities.  CV: S1S2, reg rate, irreg rhythm   PULM: breathing comfortably on [place], CTAB GI: obese, soft, NT Extremities: chronic lymphedema in feet, mild proximal LE edema Skin: warm, dyr, chronic skin changes in feet. No diffuse rashes.   BUN 13 Cr 0.98 Blood cultures> NGTD  Resolved Hospital Problem list     Assessment & Plan:   Acute Encephalopathy; possibly post-concussive after a fall several days ago. Concerning for acute encephalitis.  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  -Appreciate Neurology's recommendations- can d/c LTM -LP today;  needs meningoencephalitis panel, cell  count with differential, cultures, glucose, fluid protein level -con't antibiotics and acyclovir, hopefully can come off antibiotics and steroids today -precedex to facilitate LP; can d/c after -seizure precautions, ativan PRN - B12 supplementation -keppra per neuro  Hypertensive  emergency HFpEF, chronic - Recovered EF, last 55% HLD -Continue home Entresto; off cardene -can start low dose coreg - Needs NG tube for oral meds -tele monitoring -can hold bumex while NPO, need to resume tomorrow  Afib with RVR -better controlled rate on precedex; adding back low dose coreg. Anticipate once off precedex he will have room for more titration of coreg. -tele monitoring -daily aspirin -no AC before LP; does not appear he was on full AC PTA -IV metoprolol for breakthrough -monitor electrolytes, replete PRN  Hypothyroidism; TSH WNL -synthroid  Type 2 DM, hyperglycemia -SSI PRN -goal BG 140-180 -hold PTA metformin -keep levemir 10 units daily today; if he stays on steroids past today, he will need more  Chronic lymphedema Chronic pain -con't PTA gabapentin to prevent additional seizures or withdrawal symptoms - hold PTA oxycodone for now; monitor for signs of withdrawal  At risk for malnutrition -start TF after LP   Best Practice (right click and "Reselect all SmartList Selections" daily)   Diet/type: NPO pending speech eval DVT prophylaxis: prophylactic heparin  GI prophylaxis: N/A Lines: Central line- picc Foley:  N/A Code Status:  full code Last date of multidisciplinary goals of care discussion [pending]  Labs   CBC: Recent Labs  Lab 09/14/22 1458 09/18/22 1021 09/19/22 0639  WBC 5.0 4.3 7.2  NEUTROABS 3.8  --   --   HGB 13.5 14.1 13.2  HCT 40.6 44.8 40.3  MCV 81.9 84.8 82.2  PLT 265 292 297     Basic Metabolic Panel: Recent Labs  Lab 09/14/22 1458 09/18/22 1021 09/19/22 0640 09/19/22 1215 09/20/22 0419  NA 138 135  --  135 135  K 3.7 3.9  --  4.1 3.6  CL 100 98  --  101 97*  CO2 30 25  --  24 25  GLUCOSE 62* 248*  --  96 192*  BUN 8 9  --  9 13  CREATININE 0.63 0.75  --  0.90 0.98  CALCIUM 8.5* 8.3*  --  7.8* 7.7*  MG  --  1.2* 1.8  --   --     GFR: Estimated Creatinine Clearance: 58.5 mL/min (by C-G formula based on  SCr of 0.98 mg/dL). Recent Labs  Lab 09/14/22 1458 09/18/22 1021 09/19/22 0639  WBC 5.0 4.3 7.2    This patient is critically ill with multiple organ system failure which requires frequent high complexity decision making, assessment, support, evaluation, and titration of therapies. This was completed through the application of advanced monitoring technologies and extensive interpretation of multiple databases. During this encounter critical care time was devoted to patient care services described in this note for 36 minutes.   Julian Hy, DO 09/20/22 12:19 PM [place] [facility]  For contact information, see Amion. If no response to pager, please call PCCM consult pager. After hours, 7PM- 7AM, please call Elink.

## 2022-09-20 NOTE — Progress Notes (Signed)
NEUROLOGY CONSULTATION PROGRESS NOTE   Date of service: September 20, 2022 Patient Name: Michael [name] MRN:  [id number] DOB:  02-Jul-1946  Brief HPI  Michael [name] is a 76 y.o. male with PMH significant for diabetes, hypertension who presents with altered mental status.  He lives at home but has a home health aide who checked on him today and found him to be very confused.  He fell 4 days ago and was seen with negative CT.   He was seen to have a generalized tonic-clonic seizure which started with right facial twitching and aborted with Ativan. He was loaded with Keppra 1048m IV. 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 He also has be hypertensive requiring cardene drip.  He was put on LTM EEG which initially demonstrated left hemispheric PLED + with overlying rhythmicity. He had significant aphasia on exam and was loaded with Fosphenytoin and continued maintenance Keppra 1002mBID.   Interval Hx   Aphasia improved, unable to tolerate LP under IR and plan is to try again today while he is on precedex.  He is on full empiric meningitis coverage with Vanco, ampicillin, cefepime and acyclovir.  LTM with sharp waves, no PLEDs.  Vitals   Vitals:   09/20/22 1100 09/20/22 1113 09/20/22 1200 09/20/22 1300  BP: 120/62  136/68 (!) 108/52  Pulse: 61  (!) 59 72  Resp: (!) 6  14 (!) 0  Temp:  97.6 F (36.4 C)    TempSrc:  Oral    SpO2: 98%  97% 98%  Weight:      Height:         Body mass index is 38.87 kg/m.  Physical Exam   General: Laying comfortably in bed; confused and encephalopathic but not in distress/ HENT: Normal oropharynx and mucosa. Normal external appearance of ears and nose.  Neck: Supple, no pain or tenderness  CV: No JVD. No peripheral edema.  Pulmonary: Symmetric Chest rise. Normal respiratory effort.  Abdomen: Soft to touch, non-tender.  Ext: No cyanosis, + edema, and wrinkling and  thickelning of skin on his BL legs. Skin: No rash. Normal palpation of skin.   Musculoskeletal: Normal digits and nails by inspection. No clubbing.   Neurologic Examination on precedex.  Mental status/Cognition: drowsy, oriented to self, place, poor attention. Speech/language: Non fluent, follows some but not all commands, names some but not all objects.   Cranial nerves:   CN II Pupils equal and reactive to light, no VF deficits    CN III,IV,VI EOM intact, no gaze preference or deviation, no nystagmus    CN V normal sensation in V1, V2, and V3 segments bilaterally    CN VII no asymmetry, no nasolabial fold flattening    CN VIII normal hearing to speech    CN IX & X normal palatal elevation, no uvular deviation    CN XI Head midline.   CN XII midline tongue protrusion    Motor:  Muscle bulk: normal, tone normal Moves bilateral upper extremities spontaneously and antigravity.  Does localize to pain in bilateral lower extremity and wiggle toes to command.  Sensation:  Light touch Intact throughout   Pin prick    Temperature    Vibration   Proprioception    Coordination/Complex Motor:  - Finger to Nose attempts and almost gets to his nose bilaterally. - Heel to shin unable to do - Rapid alternating movement are slowed - Gait: Deferred for  patient safety Labs   Basic Metabolic Panel:  Lab Results  Component Value Date   NA 135 09/20/2022   K 3.6 09/20/2022   CO2 25 09/20/2022   GLUCOSE 192 (H) 09/20/2022   BUN 13 09/20/2022   CREATININE 0.98 09/20/2022   CALCIUM 7.7 (L) 09/20/2022   GFRNONAA >60 09/20/2022   GFRAA >60 01/25/2020   HbA1c:  Lab Results  Component Value Date   HGBA1C 6.3 (H) 06/20/2022   LDL:  Lab Results  Component Value Date   LDLCALC 56 06/20/2022   Urine Drug Screen: No results found for: "LABOPIA", "COCAINSCRNUR", "LABBENZ", "AMPHETMU", "THCU", "LABBARB"  Alcohol Level No results found for: "ETH" No results found for: "PHENYTOIN",  "ZONISAMIDE", "LAMOTRIGINE", "LEVETIRACETA" No results found for: "PHENYTOIN", "PHENOBARB", "VALPROATE", "CBMZ"  Imaging and Diagnostic studies   MR BRAIN WO CONTRAST  IMPRESSION: Motion limited study without definite evidence of acute intracranial abnormality.  MRI brain with and without contrast: On further review, there is equivocal/subtle DWI hyperintensity within the left hippocampus on this study as well as the prior same day MRI. No definite correlate edema on FLAIR. In this could potentially be artifactual; however, encephalitis (both autoimmune and viral) are differential considerations. Consider lumbar puncture for further evaluation.  LTM EEG: 12/21 - 12/22: This study showed evidence of epileptogenicity arising from left temporal region.  Additionally there is moderate to severe diffuse encephalopathy, nonspecific etiology. No definite seizures were seen during this study.   Impression   Michael [name] is a 76 y.o. male with PMH significant for diabetes, hypertension who presents with altered mental status.  He lives at home but has a home health aide who checked on him today and found him to be very confused.  He fell 4 days ago and was seen with negative CT.   He was seen to have a generalized tonic-clonic seizure which started with right facial twitching and aborted with Ativan. He was loaded with Keppra 1047m IV. 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 He also has be hypertensive requiring cardene drip.  He was put on LTM EEG which initially demonstrated left hemispheric PLED + with overlying rhythmicity. He had significant aphasia on exam and was loaded with Fosphenytoin and continued maintenance Keppra 10064mBID.  Dysphagia significantly improved today and EEG also shows improvement today.  He is on broad-spectrum antibiotics with Vanco, ceftriaxone, ampicillin, acyclovir at this  time.  He failed LP of the bed side.  IR was unable to do an LP as patient was unable to sit still.  Plan is to get LP under fluoroscopy with Precedex on board today.  Recommendations  - Discontinue LTM. - Continue Keppra 1000 mg twice daily. -LP under fluoroscopy today with Precedex on board. - Continue empiric coverage with bank, ceftriaxone, ampicillin and acyclovir. - Obtain CSF cell count, differential, protein, glucose, meningitis encephalitis panel, CSF Gram stain and cultures. ______________________________________________________________________   Thank you for the opportunity to take part in the care of this patient. If you have any further questions, please contact the neurology consultation attending.  Signed,  [place]ager Number [phone]

## 2022-09-20 NOTE — Progress Notes (Signed)
Patient not in the room for DC. Brought Machine back to lab.

## 2022-09-20 NOTE — Procedures (Signed)
Patient Name: Michael [name]  MRN: [id number]  Epilepsy Attending: Lora Havens  Referring Physician/Provider: Greta Doom, MD  Duration: 09/20/2022 0401 to 09/20/2022 1226   Patient history:  76 year old male with altered mental status and witnessed seizure. EEG to evaluate for seizure.   Level of alertness:  lethargic    AEDs during EEG study: LEV, PHT, GBP   Technical aspects: This EEG study was done with scalp electrodes positioned according to the 10-20 International system of electrode placement. Electrical activity was reviewed with band pass filter of 1-70Hz , sensitivity of 7 uV/mm, display speed of 88m/sec with a 60Hz  notched filter applied as appropriate. EEG data were recorded continuously and digitally stored.  Video monitoring was available and reviewed as appropriate.   Description: EEG showed continuous generalized 3-6 hz theta-delta slowing. Sharp waves were noted in left temporal region which at times appear periodic at 0.5 Hz. Hyperventilation and photic stimulation were not performed.      ABNORMALITY - Sharp waves, left temporal region - Continuous slow, generalized    IMPRESSION: This study showed evidence of epileptogenicity arising from left temporal region.  Additionally there is moderate to severe diffuse encephalopathy, nonspecific etiology. No definite seizures were seen during this study.   Michael [name] OBarbra Sarks

## 2022-09-20 NOTE — Progress Notes (Signed)
This RN assessed bilateral arms with ultrasound. No suitable veins found for peripheral IV access. Pt with non-compressible, previous infiltrate on the the right forearm. All suitable veins in left forearm already in use. Pt has a good basilic on the right upper arm. Spoke with pt's primary RN Margreta Journey, to notify care team that patient will require either PICC line or midline access. This is the recommendation of the IVT at this time.

## 2022-09-20 NOTE — Progress Notes (Signed)
SLP Cancellation Note  Patient Details Name: Michael [name] MRN: [id number] DOB: September 09, 1946   Cancelled treatment:       Reason Eval/Treat Not Completed: Medical issues which prohibited therapy  Consulted with RN about pt's appropriateness for BSE. Pt remains on Precedex in preparation for LP this afternoon, after which pt will be unable to sit upright for swallow evaluation. Pt has NGT in place.   Given planned events of the day, will hold on attempting swallow evaluation for today, and check with RN tomorrow for appropriateness. RN in agreement.  Audery Wassenaar B. Quentin Ore, [facility], [place] Speech Language Pathologist Office: [phone]  Shonna Chock 09/20/2022, 8:39 AM

## 2022-09-20 NOTE — Progress Notes (Signed)
vLTM maintenance   All impedances below 10kohms.  No skin breakdown noted at all skin sites

## 2022-09-21 ENCOUNTER — Inpatient Hospital Stay (HOSPITAL_COMMUNITY): Payer: Medicare Other

## 2022-09-21 DIAGNOSIS — I169 Hypertensive crisis, unspecified: Secondary | ICD-10-CM | POA: Diagnosis not present

## 2022-09-21 LAB — BASIC METABOLIC PANEL
Anion gap: 9 (ref 5–15)
Anion gap: 8 (ref 5–15)
BUN: 12 mg/dL (ref 8–23)
BUN: 9 mg/dL (ref 8–23)
CO2: 27 mmol/L (ref 22–32)
CO2: 26 mmol/L (ref 22–32)
Calcium: 8 mg/dL — ABNORMAL LOW (ref 8.9–10.3)
Calcium: 7.9 mg/dL — ABNORMAL LOW (ref 8.9–10.3)
Chloride: 103 mmol/L (ref 98–111)
Chloride: 105 mmol/L (ref 98–111)
Creatinine, Ser: 0.89 mg/dL (ref 0.61–1.24)
Creatinine, Ser: 0.94 mg/dL (ref 0.61–1.24)
GFR, Estimated: >60 mL/min (ref >60)
GFR, Estimated: >60 mL/min (ref >60)
Glucose, Bld: 107 mg/dL — ABNORMAL HIGH (ref 70–99)
Glucose, Bld: 74 mg/dL (ref 70–99)
Potassium: 2.8 mmol/L — ABNORMAL LOW (ref 3.5–5.1)
Potassium: 3.6 mmol/L (ref 3.5–5.1)
Sodium: 139 mmol/L (ref 135–145)
Sodium: 139 mmol/L (ref 135–145)

## 2022-09-21 LAB — MAGNESIUM
Magnesium: 1.9 mg/dL (ref 1.7–2.4)
Magnesium: 1.7 mg/dL (ref 1.7–2.4)

## 2022-09-21 LAB — GLUCOSE, CAPILLARY
Glucose-Capillary: 77 mg/dL (ref 70–99)
Glucose-Capillary: 72 mg/dL (ref 70–99)
Glucose-Capillary: 89 mg/dL (ref 70–99)
Glucose-Capillary: 95 mg/dL (ref 70–99)

## 2022-09-21 LAB — PHOSPHORUS: Phosphorus: 2 mg/dL — ABNORMAL LOW (ref 2.5–4.6)

## 2022-09-21 MED ORDER — ACETAMINOPHEN 650 MG RE SUPP: 650.0000 mg | Freq: Four times a day (QID) | RECTAL | Status: DC | PRN

## 2022-09-21 MED ORDER — SODIUM CHLORIDE 0.9 % IV SOLN: INTRAVENOUS | Status: DC

## 2022-09-21 MED ORDER — GABAPENTIN 400 MG PO CAPS
400.0000 mg | ORAL_CAPSULE | Freq: Three times a day (TID) | ORAL | Status: DC
  Administered 2022-09-21 – 2022-10-01 (×30): 400 mg via ORAL
  Filled 2022-09-21 (×31): qty 1

## 2022-09-21 MED ORDER — LACTATED RINGERS IV SOLN: INTRAVENOUS | Status: DC

## 2022-09-21 MED ORDER — PRAVASTATIN SODIUM 40 MG PO TABS
40.0000 mg | ORAL_TABLET | Freq: Every day | ORAL | Status: DC
  Administered 2022-09-22 – 2022-10-01 (×10): 40 mg via ORAL
  Filled 2022-09-21 (×10): qty 1

## 2022-09-21 MED ORDER — LOPERAMIDE HCL 1 MG/7.5ML PO SUSP
2.0000 mg | ORAL | Status: DC | PRN
  Filled 2022-09-21: qty 15

## 2022-09-21 MED ORDER — ASPIRIN 81 MG PO CHEW
81.0000 mg | CHEWABLE_TABLET | Freq: Every day | ORAL | Status: DC
  Administered 2022-09-22 – 2022-10-01 (×10): 81 mg via ORAL
  Filled 2022-09-21 (×10): qty 1

## 2022-09-21 MED ORDER — LEVOTHYROXINE SODIUM 88 MCG PO TABS
88.0000 ug | ORAL_TABLET | Freq: Every day | ORAL | Status: DC
  Administered 2022-09-22 – 2022-10-01 (×10): 88 ug via ORAL
  Filled 2022-09-21 (×10): qty 1

## 2022-09-21 MED ORDER — POTASSIUM CHLORIDE 20 MEQ PO PACK
40.0000 meq | PACK | Freq: Once | ORAL | Status: AC
  Administered 2022-09-21: 40 meq
  Filled 2022-09-21: qty 2

## 2022-09-21 MED ORDER — SACUBITRIL-VALSARTAN 49-51 MG PO TABS
1.0000 | ORAL_TABLET | Freq: Two times a day (BID) | ORAL | Status: DC
  Administered 2022-09-21 – 2022-10-01 (×19): 1 via ORAL
  Filled 2022-09-21 (×21): qty 1

## 2022-09-21 MED ORDER — LOPERAMIDE HCL 1 MG/7.5ML PO SUSP
2.0000 mg | ORAL | Status: AC | PRN
  Administered 2022-09-21 – 2022-09-23 (×3): 2 mg via ORAL
  Filled 2022-09-21 (×4): qty 15

## 2022-09-21 MED ORDER — POTASSIUM CHLORIDE 10 MEQ/100ML IV SOLN
10.0000 meq | INTRAVENOUS | Status: AC
  Administered 2022-09-21 (×4): 10 meq via INTRAVENOUS
  Filled 2022-09-21 (×3): qty 100

## 2022-09-21 MED ORDER — CARVEDILOL 12.5 MG PO TABS
12.5000 mg | ORAL_TABLET | Freq: Two times a day (BID) | ORAL | Status: DC
  Administered 2022-09-22 – 2022-09-24 (×5): 12.5 mg via ORAL
  Filled 2022-09-21 (×5): qty 1

## 2022-09-21 MED ORDER — ACETAMINOPHEN 325 MG PO TABS
650.0000 mg | ORAL_TABLET | Freq: Four times a day (QID) | ORAL | Status: DC | PRN
  Administered 2022-09-24 – 2022-09-30 (×3): 650 mg via ORAL
  Filled 2022-09-21 (×4): qty 2

## 2022-09-21 NOTE — Progress Notes (Signed)
[facility] Progress Note Patient Name: Michael [name] DOB: 02-10-1946 MRN: [id number]   Date of Service  09/21/2022  HPI/Events of Note  Patient with frequent watery stools, no clinical indication of C-Diff infection.  eICU Interventions  PRN Imodium ordered.        Kerry Kass [name] 09/21/2022, 4:56 AM

## 2022-09-21 NOTE — Progress Notes (Signed)
Confused and pulled NGT approx 1/2 out. Re inserted tube w/o much difficulty. MD notified.

## 2022-09-21 NOTE — Progress Notes (Signed)
PROGRESS NOTE    Michael [name]  UUV:[id number] DOB: 1945-10-29 DOA: 09/18/2022 PCP: Lindell Spar, MD  Outpatient Specialists:     Brief Narrative:  As per H&P done on admission: "Michael [name] is a 76 y.o. M with PMH significant for HFpEF, COPD, HL, DM, NASH, chronic lymphedema who presented to [facility] with AMS.  He had a fall four days prior to admission and was evaluated in the ED with negative head CT.  Home health went to check on patient and found him confused and called EMS.  He was initially hypertensive with BP 220/92.  While in the ED pt had a tonic-clonic seizure which ceased with Ativan.  Head CT again negative.  An MRI brain was obtained showing equivocal/subtle DWI hyperintensity within the left hippocampus could be consistent with encephalitis, no evidence of PRES.   Neurology was consulted, findings could be seen in the setting of HSV encephalitis or post-ictally in the absence  of infection.   An LP was attempted, but not successful given history of surgery at L4-L5.   Neurology recommended transfer to [facility] for IR to perform LP and for continuous EEG.    His labs were significant for Mag 1.2, no leukocytosis and normal renal function.  He was loaded with Keppra and given Acyclovir and transferred to [facility]"  09/21/2022: CSF fluid analysis revealed 0 WBC.  Antibiotics and acyclovir have been discontinued.  Patient seen alongside patient's nurse.  Patient remains confused.  No significant history from patient.  Patient has pulled NG tube severally.  For bedside swallowing evaluation by the nurse.   Assessment & Plan:   Principal Problem:   Hypertensive crisis Active Problems:   Disorientation  Acute Encephalopathy 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  -Transfer to [facility], neurology consulted for likely LTM EEG -seizure precautions, prn Ativan -IR consult  in the AM for LP -continue Acyclovir -loaded with Keppra, prn ativan -seizure precautions -no history of ETOH use -check b12 level 09/21/2022: See above documentation.  CSF analysis is nonrevealing.  Antibiotics and acyclovir have been discontinued.  Neurology input is appreciated.  LTM EEG did not reveal any definite seizure.  Patient is still on antiseizure medications.  Patient remains confused.     Hypertensive Emergency HFpEF HL Recovered EF, last 55% -cardene gtt, add home oral medications when patient can safely swallow -continue Entresto and statin when cleared for po's 09/21/2022: Blood pressure control is slowly improving.     Hypomagnesemia Mag 1.2  -received 2g IV, give an additional 2g now and repeat level in the AM 09/21/2022: Last magnesium level was 1.7.     Hypothyroidism -continue synthroid and check TSH 09/21/2022: TSH was 1.056.     Type 2 DM -SSI     Chronic lymphedema Chronic pain   DVT prophylaxis: Subcutaneous heparin Code Status: Full code Family Communication:  Disposition Plan: This will depend on hospital course   Consultants:  Neurology  Procedures:  LTM EEG  Antimicrobials:  None   Subjective: No new complaints. Patient has not particularly good historian.  Objective: Vitals:   09/21/22 0000 09/21/22 0515 09/21/22 0900 09/21/22 1300  BP: (!) 168/90 (!) 146/95 (!) 156/80 (!) 172/80  Pulse: (!) 118 (!) 109 (!) 107 (!) 115  Resp: 18 19 18 16   Temp: 98.2 F (36.8 C) 98.3 F (36.8 C) 98.5 F (36.9 C) 98.3 F (36.8 C)  TempSrc: Oral  Oral Oral Oral  SpO2: 97% 93% 95% 95%  Weight: 93.3 kg     Height:        Intake/Output Summary (Last 24 hours) at 09/21/2022 1503 Last data filed at 09/21/2022 1350 Gross per 24 hour  Intake 574.97 ml  Output 3675 ml  Net -3100.03 ml   Filed Weights   09/19/22 0600 09/20/22 0500 09/21/22 0000  Weight: 85.7 kg 87.3 kg 93.3 kg    Examination:  General exam: Appears calm and  comfortable  Respiratory system: Clear to auscultation. Respiratory effort normal. Cardiovascular system: S1 & S2 heard, RRR. No JVD, murmurs, rubs, gallops or clicks. No pedal edema. Gastrointestinal system: Abdomen is nondistended, soft and nontender. No organomegaly or masses felt. Normal bowel sounds heard. Central nervous system: Alert and oriented. No focal neurological deficits. Extremities: Symmetric 5 x 5 power. Skin: No rashes, lesions or ulcers Psychiatry: Judgement and insight appear normal. Mood & affect appropriate.     Data Reviewed: I have personally reviewed following labs and imaging studies  CBC: Recent Labs  Lab 09/18/22 1021 09/19/22 0639  WBC 4.3 7.2  HGB 14.1 13.2  HCT 44.8 40.3  MCV 84.8 82.2  PLT 292 599   Basic Metabolic Panel: Recent Labs  Lab 09/18/22 1021 09/19/22 0640 09/19/22 1215 09/20/22 0419 09/21/22 0129  NA 135  --  135 135 139  K 3.9  --  4.1 3.6 2.8*  CL 98  --  101 97* 103  CO2 25  --  24 25 27   GLUCOSE 248*  --  96 192* 107*  BUN 9  --  9 13 12   CREATININE 0.75  --  0.90 0.98 0.89  CALCIUM 8.3*  --  7.8* 7.7* 8.0*  MG 1.2* 1.8  --   --  1.9   GFR: Estimated Creatinine Clearance: 66.8 mL/min (by C-G formula based on SCr of 0.89 mg/dL). Liver Function Tests: Recent Labs  Lab 09/18/22 1021  AST 22  ALT 16  ALKPHOS 77  BILITOT 0.4  PROT 6.9  ALBUMIN 3.7   No results for input(s): "LIPASE", "AMYLASE" in the last 168 hours. Recent Labs  Lab 09/18/22 1040  AMMONIA 32   Coagulation Profile: No results for input(s): "INR", "PROTIME" in the last 168 hours. Cardiac Enzymes: No results for input(s): "CKTOTAL", "CKMB", "CKMBINDEX", "TROPONINI" in the last 168 hours. BNP (last 3 results) No results for input(s): "PROBNP" in the last 8760 hours. HbA1C: No results for input(s): "HGBA1C" in the last 72 hours. CBG: Recent Labs  Lab 09/20/22 1127 09/20/22 1619 09/20/22 2142 09/21/22 0852 09/21/22 1246  GLUCAP 169* 125*  107* 77 72   Lipid Profile: No results for input(s): "CHOL", "HDL", "LDLCALC", "TRIG", "CHOLHDL", "LDLDIRECT" in the last 72 hours. Thyroid Function Tests: Recent Labs    09/19/22 0640  TSH 1.056   Anemia Panel: Recent Labs    09/19/22 0640  VITAMINB12 168*   Urine analysis:    Component Value Date/Time   COLORURINE STRAW (A) 09/18/2022 1100   APPEARANCEUR CLEAR 09/18/2022 1100   APPEARANCEUR Clear 12/21/2021 1054   LABSPEC 1.010 09/18/2022 1100   PHURINE 6.0 09/18/2022 1100   GLUCOSEU >=500 (A) 09/18/2022 1100   HGBUR SMALL (A) 09/18/2022 1100   BILIRUBINUR NEGATIVE 09/18/2022 1100   BILIRUBINUR negative 04/25/2022 0857   BILIRUBINUR Negative 12/21/2021 1054   KETONESUR NEGATIVE 09/18/2022 1100   PROTEINUR >=300 (A) 09/18/2022 1100   UROBILINOGEN 0.2 04/25/2022 0857   UROBILINOGEN 0.2 08/24/2013 1129   NITRITE  NEGATIVE 09/18/2022 1100   LEUKOCYTESUR NEGATIVE 09/18/2022 1100   Sepsis Labs: @LABRCNTIP (procalcitonin:4,lacticidven:4)  ) Recent Results (from the past 240 hour(s))  Resp panel by RT-PCR (RSV, Flu A&B, Covid) Anterior Nasal Swab     Status: None   Collection Time: 09/18/22 11:00 AM   Specimen: Anterior Nasal Swab  Result Value Ref Range Status   SARS Coronavirus 2 by RT PCR NEGATIVE NEGATIVE Final    Comment: (NOTE) SARS-CoV-2 target nucleic acids are NOT DETECTED.  The SARS-CoV-2 RNA is generally detectable in upper respiratory specimens during the acute phase of infection. The lowest concentration of SARS-CoV-2 viral copies this assay can detect is 138 copies/mL. A negative result does not preclude SARS-Cov-2 infection and should not be used as the sole basis for treatment or other patient management decisions. A negative result may occur with  improper specimen collection/handling, submission of specimen other than nasopharyngeal swab, presence of viral mutation(s) within the areas targeted by this assay, and inadequate number of  viral copies(<138 copies/mL). A negative result must be combined with clinical observations, patient history, and epidemiological information. The expected result is Negative.  Fact Sheet for Patients:  EntrepreneurPulse.com.au  Fact Sheet for Healthcare Providers:  IncredibleEmployment.be  This test is no t yet approved or cleared by the [place] FDA and  has been authorized for detection and/or diagnosis of SARS-CoV-2 by FDA under an Emergency Use Authorization (EUA). This EUA will remain  in effect (meaning this test can be used) for the duration of the COVID-19 declaration under Section 564(b)(1) of the Act, 21 U.S.C.section 360bbb-3(b)(1), unless the authorization is terminated  or revoked sooner.       Influenza A by PCR NEGATIVE NEGATIVE Final   Influenza B by PCR NEGATIVE NEGATIVE Final    Comment: (NOTE) The Xpert Xpress SARS-CoV-2/FLU/RSV plus assay is intended as an aid in the diagnosis of influenza from Nasopharyngeal swab specimens and should not be used as a sole basis for treatment. Nasal washings and aspirates are unacceptable for Xpert Xpress SARS-CoV-2/FLU/RSV testing.  Fact Sheet for Patients: EntrepreneurPulse.com.au  Fact Sheet for Healthcare Providers: IncredibleEmployment.be  This test is not yet approved or cleared by the [place] FDA and has been authorized for detection and/or diagnosis of SARS-CoV-2 by FDA under an Emergency Use Authorization (EUA). This EUA will remain in effect (meaning this test can be used) for the duration of the COVID-19 declaration under Section 564(b)(1) of the Act, 21 U.S.C. section 360bbb-3(b)(1), unless the authorization is terminated or revoked.     Resp Syncytial Virus by PCR NEGATIVE NEGATIVE Final    Comment: (NOTE) Fact Sheet for Patients: EntrepreneurPulse.com.au  Fact Sheet for Healthcare  Providers: IncredibleEmployment.be  This test is not yet approved or cleared by the [place] FDA and has been authorized for detection and/or diagnosis of SARS-CoV-2 by FDA under an Emergency Use Authorization (EUA). This EUA will remain in effect (meaning this test can be used) for the duration of the COVID-19 declaration under Section 564(b)(1) of the Act, 21 U.S.C. section 360bbb-3(b)(1), unless the authorization is terminated or revoked.  Performed at [facility], [address], [place], [place] [address]   MRSA Next Gen by PCR, Nasal     Status: None   Collection Time: 09/19/22  2:45 AM   Specimen: Nasal Mucosa; Nasal Swab  Result Value Ref Range Status   MRSA by PCR Next Gen NOT DETECTED NOT DETECTED Final    Comment: (NOTE) The GeneXpert MRSA Assay (FDA approved for NASAL  specimens only), is one component of a comprehensive MRSA colonization surveillance program. It is not intended to diagnose MRSA infection nor to guide or monitor treatment for MRSA infections. Test performance is not FDA approved in patients less than 66 years old. Performed at [facility], [place] [address], [place], [place] [address]   Culture, blood (Routine X 2) w Reflex to ID Panel     Status: None (Preliminary result)   Collection Time: 09/19/22 12:15 PM   Specimen: BLOOD RIGHT HAND  Result Value Ref Range Status   Specimen Description BLOOD RIGHT HAND  Final   Special Requests IN PEDIATRIC BOTTLE Blood Culture adequate volume  Final   Culture   Final    NO GROWTH 2 DAYS Performed at [facility], [place] [address] [place], [place] [address]    Report Status PENDING  Incomplete  Culture, blood (Routine X 2) w Reflex to ID Panel     Status: None (Preliminary result)   Collection Time: 09/19/22  2:37 PM   Specimen: BLOOD RIGHT HAND  Result Value Ref Range Status   Specimen Description BLOOD RIGHT HAND  Final   Special Requests   Final    BOTTLES DRAWN AEROBIC  AND ANAEROBIC Blood Culture adequate volume   Culture   Final    NO GROWTH 2 DAYS Performed at [facility], [place] [address], [place], [place] [address]    Report Status PENDING  Incomplete  CSF culture w Gram Stain     Status: None (Preliminary result)   Collection Time: 09/20/22  1:28 PM   Specimen: PATH Cytology CSF; Cerebrospinal Fluid  Result Value Ref Range Status   Specimen Description CSF  Final   Special Requests NONE  Final   Gram Stain   Final    WBC PRESENT, PREDOMINANTLY MONONUCLEAR NO ORGANISMS SEEN    Culture   Final    NO GROWTH < 24 HOURS Performed at [facility], [place] [address], [place], [place] [address]    Report Status PENDING  Incomplete         Radiology Studies: DG CHEST PORT 1 VIEW  Result Date: 09/21/2022 CLINICAL DATA:  Short of breath. EXAM: PORTABLE CHEST 1 VIEW COMPARISON:  09/18/2022. FINDINGS: Cardiac silhouette is normal in size. Normal mediastinal and hilar contours. Lungs are clear.  No convincing pleural effusion.  No pneumothorax. Nasal/orogastric tube passes below the diaphragm. Skeletal structures are grossly intact. IMPRESSION: No active disease. Electronically Signed   By: Lajean Manes M.D.   On: 09/21/2022 11:09   DG FL GUIDED LUMBAR PUNCTURE  Result Date: 09/20/2022 CLINICAL DATA:  Provided history: Altered mental status. EXAM: DIAGNOSTIC LUMBAR PUNCTURE UNDER FLUOROSCOPIC GUIDANCE COMPARISON:  Lumbar spine CT 09/14/2022. FLUOROSCOPY: Fluoroscopy time: 42 seconds (6.50 mGy). PROCEDURE: Due to the patient's altered mental status, Hayley Boisseau, PA-C obtained informed consent from the patient's niece Danford Bad) prior to the procedure. This process included a discussion of procedural risks. The patient was positioned prone on the fluoroscopy table. An appropriate skin entry site was determined under fluoroscopy and marked. A time-out was performed. The operator donned sterile gloves and a mask. 1% lidocaine was used for  local anesthesia. Subsequently, lumbar puncture was performed at the L2-L3 level using a 20 gauge spinal needle with return of clear CSF. 10 mL of CSF were collected and sent for laboratory studies. The patient tolerated the procedure well, and no immediate post-procedure complication was apparent. The procedure was performed by  Hayley Boisseau, PA-C, and was supervised and interpreted by Dr. Kellie Simmering. IMPRESSION: 1. Technically successful fluoroscopically-guided L2-L3 lumbar puncture. 2. 10 mL of CSF collected for laboratory studies. 3. No immediate post-procedure complication. Electronically Signed   By: Kellie Simmering D.O.   On: 09/20/2022 14:13   [place] EKG SITE RITE  Result Date: 09/20/2022 [facility] image not attached, placement could not be confirmed due to current cardiac rhythm.       Scheduled Meds:  aspirin  81 mg Per Tube Daily   carvedilol  12.5 mg Per Tube BID WC   Chlorhexidine Gluconate Cloth  6 each Topical Daily   gabapentin  400 mg Per Tube TID   heparin  5,000 Units Subcutaneous Q8H   insulin aspart  0-15 Units Subcutaneous TID WC   insulin aspart  0-5 Units Subcutaneous QHS   insulin detemir  10 Units Subcutaneous QHS   levothyroxine  88 mcg Per Tube Q0600   pantoprazole (PROTONIX) IV  40 mg Intravenous Q24H   pravastatin  40 mg Per Tube Daily   sacubitril-valsartan  1 tablet Per Tube BID   terbinafine  250 mg Oral Daily   Continuous Infusions:  sodium chloride 10 mL/hr at 09/20/22 2200   sodium chloride 125 mL/hr at 09/21/22 1140   acyclovir 670 mg (09/21/22 1444)   levETIRAcetam 1,000 mg (09/21/22 1144)     LOS: 3 days    Time spent: 55 minutes.    Dana Allan, MD  [facility] Pager #: [phone] 7PM-7AM contact night coverage as above

## 2022-09-21 NOTE — Progress Notes (Incomplete)
Pt more alert and follows commands appropriately, however remains forgetful. Swallow screen performed at bedside. HOB 45 degrees. Provided ice chips, sips of water and takes pudding/puree with feeding assistan

## 2022-09-21 NOTE — Progress Notes (Signed)
PT had 5 beat run of vtach. Multiple pvs. Pt is asymptomatic. Spoke with Camron via [place]. Mag lab draw ordered. Potassium  results pending.

## 2022-09-21 NOTE — Progress Notes (Signed)
Pt more alert and able to follows commands more appropriately. Seen by Dr Marthenia Rolling. Pt pulled NGT out earlier this morning. Performed Trevor Mace Screen at bedside. Tolerated ice chips, sips of water and pudding/puree consistency without cough or other difficulty. PO meds given one at a time with pudding.

## 2022-09-21 NOTE — Progress Notes (Signed)
NEUROLOGY CONSULTATION PROGRESS NOTE   Date of service: September 21, 2022 Patient Name: Michael [name] MRN:  [id number] DOB:  September 05, 1946  Brief HPI  JOSUE FALCONI is a 76 y.o. male with PMH significant for diabetes, hypertension who presents with altered mental status.  He lives at home but has a home health aide who checked on him today and found him to be very confused.  He fell 4 days ago and was seen with negative CT.   He was seen to have a generalized tonic-clonic seizure which started with right facial twitching and aborted with Ativan. He was loaded with Keppra 1064m IV. 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 He also has be hypertensive requiring cardene drip.  He was put on LTM EEG which initially demonstrated left hemispheric PLED + with overlying rhythmicity. He had significant aphasia on exam and was loaded with Fosphenytoin and continued maintenance Keppra 10030mBID.  Started on full empiric meningitis coverage. Failed LP and LP under fluoro   Interval Hx   LP under fluoro.  Vitals   Vitals:   09/20/22 2300 09/21/22 0000 09/21/22 0515 09/21/22 0900  BP: (!) 163/74 (!) 168/90 (!) 146/95 (!) 156/80  Pulse: (!) 117 (!) 118 (!) 109 (!) 107  Resp: 17 18 19 18   Temp:  98.2 F (36.8 C) 98.3 F (36.8 C) 98.5 F (36.9 C)  TempSrc:  Oral Oral Oral  SpO2: 96% 97% 93% 95%  Weight:  93.3 kg    Height:         Body mass index is 41.54 kg/m.  Physical Exam   General: Laying comfortably in bed; confused and encephalopathic but not in distress/ HENT: Normal oropharynx and mucosa. Normal external appearance of ears and nose.  Neck: Supple, no pain or tenderness  CV: No JVD. No peripheral edema.  Pulmonary: Symmetric Chest rise. Normal respiratory effort.  Abdomen: Soft to touch, non-tender.  Ext: No cyanosis, + edema, and wrinkling and thickelning of skin on his BL legs. Skin: No rash.  Normal palpation of skin.   Musculoskeletal: Normal digits and nails by inspection. No clubbing.   Neurologic Examination on precedex.  Mental status/Cognition: drowsy, oriented to self, place, poor attention. Speech/language: Non fluent, follows some but not all commands, names some but not all objects.   Cranial nerves:   CN II Pupils equal and reactive to light, no VF deficits    CN III,IV,VI EOM intact, no gaze preference or deviation, no nystagmus    CN V normal sensation in V1, V2, and V3 segments bilaterally    CN VII no asymmetry, no nasolabial fold flattening    CN VIII normal hearing to speech    CN IX & X normal palatal elevation, no uvular deviation    CN XI Head midline.   CN XII midline tongue protrusion    Motor:  Muscle bulk: normal, tone normal Moves bilateral upper extremities spontaneously and antigravity.  Does localize to pain in bilateral lower extremity and wiggle toes to command.  Sensation:  Light touch Intact throughout   Pin prick    Temperature    Vibration   Proprioception    Coordination/Complex Motor:  - Finger to Nose attempts and almost gets to his nose bilaterally. - Heel to shin unable to do - Rapid alternating movement are slowed - Gait: Deferred for patient safety Labs   Basic Metabolic Panel:  Lab Results  Component Value  Date   NA 139 09/21/2022   K 2.8 (L) 09/21/2022   CO2 27 09/21/2022   GLUCOSE 107 (H) 09/21/2022   BUN 12 09/21/2022   CREATININE 0.89 09/21/2022   CALCIUM 8.0 (L) 09/21/2022   GFRNONAA >60 09/21/2022   GFRAA >60 01/25/2020   HbA1c:  Lab Results  Component Value Date   HGBA1C 6.3 (H) 06/20/2022   LDL:  Lab Results  Component Value Date   LDLCALC 56 06/20/2022   Urine Drug Screen: No results found for: "LABOPIA", "COCAINSCRNUR", "LABBENZ", "AMPHETMU", "THCU", "LABBARB"  Alcohol Level No results found for: "ETH" No results found for: "PHENYTOIN", "ZONISAMIDE", "LAMOTRIGINE", "LEVETIRACETA" No results  found for: "PHENYTOIN", "PHENOBARB", "VALPROATE", "CBMZ"  Imaging and Diagnostic studies   MR BRAIN WO CONTRAST  IMPRESSION: Motion limited study without definite evidence of acute intracranial abnormality.  MRI brain with and without contrast: On further review, there is equivocal/subtle DWI hyperintensity within the left hippocampus on this study as well as the prior same day MRI. No definite correlate edema on FLAIR. In this could potentially be artifactual; however, encephalitis (both autoimmune and viral) are differential considerations. Consider lumbar puncture for further evaluation.  LTM EEG: 12/21 - 12/22: This study showed evidence of epileptogenicity arising from left temporal region.  Additionally there is moderate to severe diffuse encephalopathy, nonspecific etiology. No definite seizures were seen during this study.   Impression   Michael [name] is a 76 y.o. male with PMH significant for diabetes, hypertension who presents with altered mental status.  He lives at home but has a home health aide who checked on him today and found him to be very confused.  He fell 4 days ago and was seen with negative CT.   He was seen to have a generalized tonic-clonic seizure which started with right facial twitching and aborted with Ativan. He was loaded with Keppra 1060m IV. 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 He also has be hypertensive requiring cardene drip.  He was put on LTM EEG which initially demonstrated left hemispheric PLED + with overlying rhythmicity. He had significant aphasia on exam and was loaded with Fosphenytoin and continued maintenance Keppra 10037mBID.  Aphasia significantly improved and EEG improved. Discontinued LTM EEG.  He was started on broad-spectrum antibiotics with Vanco, ceftriaxone, ampicillin, acyclovir at this time.  He failed LP of the bed side.  IR was unable to do  an LP as patient was unable to sit still.    Recommendations  - Continue Keppra 1000 mg twice daily. -LP under fluoroscopy with CSF studies with no Wbc, meningitis/encephalitis panel is negative, cultures negative so far. - Stop empiric meningitis coverage. ______________________________________________________________________   Thank you for the opportunity to take part in the care of this patient. If you have any further questions, please contact the neurology consultation attending.  Signed,  SaDeschutes River Woodsager Number [phone]

## 2022-09-22 DIAGNOSIS — I169 Hypertensive crisis, unspecified: Secondary | ICD-10-CM | POA: Diagnosis not present

## 2022-09-22 LAB — GLUCOSE, CAPILLARY
Glucose-Capillary: 88 mg/dL (ref 70–99)
Glucose-Capillary: 148 mg/dL — ABNORMAL HIGH (ref 70–99)
Glucose-Capillary: 133 mg/dL — ABNORMAL HIGH (ref 70–99)
Glucose-Capillary: 99 mg/dL (ref 70–99)
Glucose-Capillary: 168 mg/dL — ABNORMAL HIGH (ref 70–99)

## 2022-09-22 LAB — RENAL FUNCTION PANEL
Albumin: 2.7 g/dL — ABNORMAL LOW (ref 3.5–5.0)
Albumin: 2.6 g/dL — ABNORMAL LOW (ref 3.5–5.0)
Anion gap: 12 (ref 5–15)
Anion gap: 14 (ref 5–15)
BUN: 6 mg/dL — ABNORMAL LOW (ref 8–23)
BUN: 5 mg/dL — ABNORMAL LOW (ref 8–23)
CO2: 24 mmol/L (ref 22–32)
CO2: 24 mmol/L (ref 22–32)
Calcium: 8 mg/dL — ABNORMAL LOW (ref 8.9–10.3)
Calcium: 7.9 mg/dL — ABNORMAL LOW (ref 8.9–10.3)
Chloride: 101 mmol/L (ref 98–111)
Chloride: 99 mmol/L (ref 98–111)
Creatinine, Ser: 0.75 mg/dL (ref 0.61–1.24)
Creatinine, Ser: 1 mg/dL (ref 0.61–1.24)
GFR, Estimated: >60 mL/min (ref >60)
GFR, Estimated: >60 mL/min (ref >60)
Glucose, Bld: 86 mg/dL (ref 70–99)
Glucose, Bld: 162 mg/dL — ABNORMAL HIGH (ref 70–99)
Phosphorus: 1.7 mg/dL — ABNORMAL LOW (ref 2.5–4.6)
Phosphorus: 1.7 mg/dL — ABNORMAL LOW (ref 2.5–4.6)
Potassium: 3.2 mmol/L — ABNORMAL LOW (ref 3.5–5.1)
Potassium: 2.6 mmol/L — CL (ref 3.5–5.1)
Sodium: 137 mmol/L (ref 135–145)
Sodium: 137 mmol/L (ref 135–145)

## 2022-09-22 LAB — MAGNESIUM
Magnesium: 1.3 mg/dL — ABNORMAL LOW (ref 1.7–2.4)
Magnesium: 2 mg/dL (ref 1.7–2.4)

## 2022-09-22 MED ORDER — LEVETIRACETAM IN NACL 1000 MG/100ML IV SOLN
1000.0000 mg | Freq: Two times a day (BID) | INTRAVENOUS | Status: DC
  Filled 2022-09-22 (×8): qty 100

## 2022-09-22 MED ORDER — POTASSIUM CHLORIDE 10 MEQ/100ML IV SOLN
10.0000 meq | INTRAVENOUS | Status: AC
  Administered 2022-09-22 – 2022-09-23 (×6): 10 meq via INTRAVENOUS
  Filled 2022-09-22 (×6): qty 100

## 2022-09-22 MED ORDER — LABETALOL HCL 5 MG/ML IV SOLN: 5.0000 mg | INTRAVENOUS | Status: DC | PRN

## 2022-09-22 MED ORDER — LEVETIRACETAM 500 MG PO TABS
1000.0000 mg | ORAL_TABLET | Freq: Two times a day (BID) | ORAL | Status: DC
  Administered 2022-09-22 – 2022-10-01 (×18): 1000 mg via ORAL
  Filled 2022-09-22 (×19): qty 2

## 2022-09-22 MED ORDER — MAGNESIUM SULFATE 4 GM/100ML IV SOLN
4.0000 g | Freq: Once | INTRAVENOUS | Status: AC
  Administered 2022-09-22: 4 g via INTRAVENOUS
  Filled 2022-09-22: qty 100

## 2022-09-22 MED ORDER — ADULT MULTIVITAMIN W/MINERALS CH
1.0000 | ORAL_TABLET | Freq: Every day | ORAL | Status: DC
  Administered 2022-09-22 – 2022-10-01 (×10): 1 via ORAL
  Filled 2022-09-22 (×10): qty 1

## 2022-09-22 MED ORDER — APIXABAN 5 MG PO TABS
5.0000 mg | ORAL_TABLET | Freq: Two times a day (BID) | ORAL | Status: DC
  Administered 2022-09-22 – 2022-10-01 (×18): 5 mg via ORAL
  Filled 2022-09-22 (×18): qty 1

## 2022-09-22 MED ORDER — VITAMIN B-12 1000 MCG PO TABS
1500.0000 ug | ORAL_TABLET | Freq: Every day | ORAL | Status: DC
  Administered 2022-09-22 – 2022-10-01 (×10): 1500 ug via ORAL
  Filled 2022-09-22 (×11): qty 5

## 2022-09-22 MED ORDER — POTASSIUM CHLORIDE CRYS ER 20 MEQ PO TBCR
40.0000 meq | EXTENDED_RELEASE_TABLET | Freq: Once | ORAL | Status: AC
  Administered 2022-09-23: 40 meq via ORAL
  Filled 2022-09-22: qty 2

## 2022-09-22 MED ORDER — LABETALOL HCL 5 MG/ML IV SOLN
5.0000 mg | INTRAVENOUS | Status: AC | PRN
  Administered 2022-09-22 (×2): 5 mg via INTRAVENOUS
  Filled 2022-09-22 (×2): qty 4

## 2022-09-22 MED ORDER — DILTIAZEM HCL-DEXTROSE 125-5 MG/125ML-% IV SOLN (PREMIX)
5.0000 mg/h | INTRAVENOUS | Status: DC
  Administered 2022-09-22: 5 mg/h via INTRAVENOUS
  Administered 2022-09-23 – 2022-09-24 (×4): 15 mg/h via INTRAVENOUS
  Filled 2022-09-22 (×5): qty 125

## 2022-09-22 NOTE — Progress Notes (Signed)
   09/22/22 1730  Assess: MEWS Score  BP (!) 153/73  Pulse Rate (!) 139 (MD notfied)  Level of Consciousness Alert  SpO2 100 %  Assess: MEWS Score  MEWS Temp 0  MEWS Systolic 0  MEWS Pulse 3  MEWS RR 0  MEWS LOC 0  MEWS Score 3  MEWS Score Color Yellow  Assess: if the MEWS score is Yellow or Red  Were vital signs taken at a resting state? Yes  Focused Assessment Change from prior assessment (see assessment flowsheet)  Does the patient meet 2 or more of the SIRS criteria? No  Does the patient have a confirmed or suspected source of infection? No  Provider and Rapid Response Notified? Yes  MEWS guidelines implemented *See Row Information* Yes  Treat  MEWS Interventions Escalated (See documentation below)  Take Vital Signs  Increase Vital Sign Frequency  Yellow: Q 2hr X 2 then Q 4hr X 2, if remains yellow, continue Q 4hrs  Escalate  MEWS: Escalate Yellow: discuss with charge nurse/RN and consider discussing with provider and RRT  Notify: Charge Nurse/RN  Name of Charge Nurse/RN Notified Nona Dell, RN  Date Charge Nurse/RN Notified 09/22/22  Time Charge Nurse/RN Notified 1745  Provider Notification  Provider Name/Title Bevelyn Buckles  Date Provider Notified 09/22/22  Time Provider Notified 1732  Method of Notification Page  Notification Reason Change in status  Provider response See new orders  Date of Provider Response 09/22/22  Time of Provider Response 1737  Assess: SIRS CRITERIA  SIRS Temperature  0  SIRS Pulse 1  SIRS Respirations  0  SIRS WBC 0  SIRS Score Sum  1

## 2022-09-22 NOTE — Progress Notes (Signed)
Overnight event  Patient in A-fib with RVR with rate in the 140s to 160s.  Hypokalemia likely contributing as repeat labs showing potassium 2.6.  Potassium replacement has been ordered and will continue to monitor.  Magnesium 2.0.  TSH was normal on labs done yesterday.  Patient is asymptomatic.  PE less likely as patient is not hypoxic.  Last echo done 11 months ago showing EF 50-55%.  He was started on Eliquis during this hospitalization.  He is on IV Cardizem gtt and was given a dose of IV metoprolol 5 mg.  Blood pressure stable.  Rate has improved to 110-120s.  Blood pressure 148/81.  Continue IV Cardizem gtt.  Repeat echocardiogram ordered.  Continue to monitor closely.

## 2022-09-22 NOTE — Evaluation (Signed)
Clinical/Bedside Swallow Evaluation Patient Details  Name: Michael [name] MRN: [id number] Date of Birth: 1945/11/01  Today's Date: 09/22/2022 Time: SLP Start Time (ACUTE ONLY): 35 SLP Stop Time (ACUTE ONLY): 1055 SLP Time Calculation (min) (ACUTE ONLY): 33 min  Past Medical History:  Past Medical History:  Diagnosis Date   Arthritis    Congestive heart disease ([place]) 11/2015   COPD (chronic obstructive pulmonary disease) (HCC)    DDD (degenerative disc disease), lumbosacral    Demand ischemia 09/23/2019   Depression    Diabetic neuropathy (HCC)    Diastolic dysfunction    Essential hypertension    GERD (gastroesophageal reflux disease)    Hyperlipidemia    Iron deficiency anemia    Left knee DJD    Lumbar spinal stenosis    Lymphedema 11/2015   BOTH LEGS   NASH (nonalcoholic steatohepatitis)    Peripheral edema    Peripheral edema 10/05/2015   Type 2 diabetes mellitus ([place])    Past Surgical History:  Past Surgical History:  Procedure Laterality Date   BIOPSY N/A 07/21/2014   Procedure: BIOPSY;  Surgeon: Daneil Dolin, MD;  Location: [facility];  Service: Endoscopy;  Laterality: N/A;   CATARACT EXTRACTION W/PHACO Right 07/08/2016   Procedure: CATARACT EXTRACTION PHACO AND INTRAOCULAR LENS PLACEMENT RIGHT EYE;  Surgeon: Tonny Branch, MD;  Location: [facility];  Service: Ophthalmology;  Laterality: Right;  CDE: 9.01   CATARACT EXTRACTION W/PHACO Left 07/22/2016   Procedure: CATARACT EXTRACTION PHACO AND INTRAOCULAR LENS PLACEMENT LEFT EYE CDE=10.69;  Surgeon: Tonny Branch, MD;  Location: [facility];  Service: Ophthalmology;  Laterality: Left;  left -    COLONOSCOPY  2011   [facility]: normal colon, normal distal ileum   COLONOSCOPY WITH PROPOFOL N/A 07/21/2014   Procedure: ATTEMPTED COLONOSCOPY WITH PROPOFOL-HAD TO STOP DUE TO BRADYCARDIA;  Surgeon: Daneil Dolin, MD;  Location: [facility];  Service: Endoscopy;  Laterality: N/A;   EGD with enteroscopy  2011   [facility]: normal esophagus and  stomach. Normal duodenum, jejunum. No evidence of AVMs.    ESOPHAGOGASTRODUODENOSCOPY (EGD) WITH PROPOFOL N/A 07/21/2014   Procedure: ESOPHAGOGASTRODUODENOSCOPY (EGD) WITH PROPOFOL;  Surgeon: Daneil Dolin, MD;  Location: [facility];  Service: Endoscopy;  Laterality: N/A;   HERNIA REPAIR     KNEE ARTHROSCOPY WITH MEDIAL MENISECTOMY Left 11/06/2012   Procedure: KNEE ARTHROSCOPY WITH MEDIAL MENISECTOMY;  Surgeon: Carole Civil, MD;  Location: [facility];  Service: Orthopedics;  Laterality: Left;   Lipoma removal     Stomach   LUMBAR LAMINECTOMY/DECOMPRESSION MICRODISCECTOMY Left 08/30/2013   Procedure: LUMBAR LAMINECTOMY/DECOMPRESSION MICRODISCECTOMY LEFT  LUMBAR TWO THREE;  Surgeon: Otilio Connors, MD;  Location: [place] [facility];  Service: Neurosurgery;  Laterality: Left;   RIGHT/LEFT HEART CATH AND CORONARY ANGIOGRAPHY N/A 09/22/2019   Procedure: RIGHT/LEFT HEART CATH AND CORONARY ANGIOGRAPHY;  Surgeon: Martinique, Peter M, MD;  Location: [place] [facility] LAB;  Service: Cardiovascular;  Laterality: N/A;   SHOULDER SURGERY     Rght-rotator cuff   TOOTH EXTRACTION     HPI:   Michael [name] is a 76 y.o. M who presented to [facility] ED via EMS with AMS; while in ED had a tonic-clonic seizure.   An MRI brain was obtained showing equivocal/subtle DWI hyperintensity within the left hippocampus could be consistent with encephalitis, no evidence of PRES.   Transferred to [facility] for IR to perform LP and for continuous EEG. Dx include acute encephalopathy, new onset seizure activity, hypertensive emergency. PMH significant for HFpEF,  COPD, HL, DM, NASH, chronic lymphedema.        Assessment / Plan / Recommendation  Clinical Impression  Pt presents with normal oropharyngeal swallow. He was alert, remains confused and fearful re: poor memory as well as concerns that he is actively dying. Provided encouragement.  He demonstrated no difficulty swallowing, no concerns for aspiration, thorough mastication and + ability to feed  himself. Resume a regular diet, thin liquids. No SLP f/u is needed. SLP Visit Diagnosis: Dysphagia, unspecified (R13.10)    Aspiration Risk  No limitations    Diet Recommendation   Regular solids, thin liquids  Medication Administration: Whole meds with liquid    Other  Recommendations Oral Care Recommendations: Oral care BID    Recommendations for follow up therapy are one component of a multi-disciplinary discharge planning process, led by the attending physician.  Recommendations may be updated based on patient status, additional functional criteria and insurance authorization.  Follow up Recommendations No SLP follow up        [place] Date of Onset: 09/22/22 Type of Study: Bedside Swallow Evaluation Previous Swallow Assessment: no Diet Prior to this Study: Other (Comment) (clear liquids) Temperature Spikes Noted: No Respiratory Status: Room air History of Recent Intubation: No Behavior/Cognition: Alert;Cooperative;Pleasant mood;Confused Oral Cavity Assessment: Within Functional Limits Oral Care Completed by SLP: Recent completion by staff Vision: Functional for self-feeding Self-Feeding Abilities: Able to feed self Patient Positioning: Upright in bed Baseline Vocal Quality: Normal Volitional Cough: Strong Volitional Swallow: Able to elicit    Oral/Motor/Sensory Function Overall Oral Motor/Sensory Function: Within functional limits   Ice Chips Ice chips: Within functional limits   Thin Liquid Thin Liquid: Within functional limits    Nectar Thick Nectar Thick Liquid: Not tested   Honey Thick Honey Thick Liquid: Not tested   Puree Puree: Within functional limits   Solid     Solid: Within functional limits      Michael [name] 09/22/2022,11:04 AM  Estill Bamberg L. Tivis Ringer, MA CCC/SLP Clinical Specialist - [place] Office number [phone]

## 2022-09-22 NOTE — Progress Notes (Signed)
PROGRESS NOTE    KAIZER DISSINGER  JQB:[id number] DOB: 06/27/1946 DOA: 09/18/2022 PCP: Lindell Spar, MD  Outpatient Specialists:     Brief Narrative:  As per H&P done on admission: "Michael [name] is a 76 y.o. M with PMH significant for HFpEF, COPD, HL, DM, NASH, chronic lymphedema who presented to [facility] ED with AMS.  He had a fall four days prior to admission and was evaluated in the ED with negative head CT.  Home health went to check on patient and found him confused and called EMS.  He was initially hypertensive with BP 220/92.  While in the ED pt had a tonic-clonic seizure which ceased with Ativan.  Head CT again negative.  An MRI brain was obtained showing equivocal/subtle DWI hyperintensity within the left hippocampus could be consistent with encephalitis, no evidence of PRES.   Neurology was consulted, findings could be seen in the setting of HSV encephalitis or post-ictally in the absence  of infection.   An LP was attempted, but not successful given history of surgery at L4-L5.   Neurology recommended transfer to [facility] for IR to perform LP and for continuous EEG.    His labs were significant for Mag 1.2, no leukocytosis and normal renal function.  He was loaded with Keppra and given Acyclovir and transferred to [facility]"  09/21/2022: CSF fluid analysis revealed 0 WBC.  Antibiotics and acyclovir have been discontinued.  Patient seen alongside patient's nurse.  Patient remains confused.  No significant history from patient.  Patient has pulled NG tube severally.  For bedside swallowing evaluation by the nurse.  09/22/2022: Patient seen.  Patient remains significantly confused.  Patient developed A-fib RVR later in the day.  Patient was started Cardizem drip and Eliquis.  Low threshold to consult the cardiology team.  Likely, patient may have paroxysmal atrial fibrillation.   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  CSF analysis is nonrevealing.  Antibiotics and acyclovir have been discontinued.  Neurology input is appreciated.  LTM EEG did not reveal any definite seizure.  Patient is still on antiseizure medications.  Patient remains confused. 09/22/2022: Patient remains significantly confused.  A-fib with RVR: -Start Cardizem drip. -Start Eliquis. -Low threshold to reconsult cardiology team. -Low threshold to repeat echocardiogram. -Normal TSH. -Check D-dimer.     Hypertensive Emergency HFpEF HL Recovered EF, last 55% -cardene gtt, add home oral medications when patient can safely swallow -continue Entresto and statin when cleared for po's 09/22/2022: Blood pressure control is slowly improving.     Hypomagnesemia Mag 1.2  -received 2g IV, give an additional 2g now and repeat level in the AM 09/22/2022: Last magnesium level was 2.  Continue to monitor and replete     Hypothyroidism -continue synthroid and check TSH 09/21/2022: TSH was 1.056.     Type 2 DM -SSI     Chronic lymphedema Chronic pain   DVT prophylaxis: Eliquis Code Status: Full code Family Communication:  Disposition Plan: This will depend on hospital course   Consultants:  Neurology  Procedures:  LTM EEG  Antimicrobials:  None   Subjective: No new complaints. Patient is confused, therefore, can not give any history.   Objective: Vitals:   09/22/22 0004 09/22/22 0451 09/22/22 0719 09/22/22 1142  BP: (!) 194/78 (!) 176/93 (!) 183/86 (!) 167/81   Pulse: 98 90 60 75  Resp: 11 16 18 17   Temp:  98.1 F (36.7 C) 97.7 F (36.5 C) 98 F (36.7 C)  TempSrc:  Oral Oral Oral  SpO2: 98% 99% 99% 100%  Weight:      Height:        Intake/Output Summary (Last 24 hours) at 09/22/2022 1216 Last data filed at 09/22/2022 1144 Gross per 24 hour  Intake 962.68 ml  Output 7250 ml  Net -6287.32 ml    Filed Weights   09/19/22 0600 09/20/22 0500 09/21/22 0000  Weight: 85.7 kg 87.3 kg 93.3 kg    Examination:  General exam: Appears calm and comfortable.  Remains confused Respiratory system: Clear to auscultation.   Cardiovascular system: S1 & S2 heard  Gastrointestinal system: Abdomen is obese, soft and non tender. . Central nervous system: Confused.  Data Reviewed: I have personally reviewed following labs and imaging studies  CBC: Recent Labs  Lab 09/18/22 1021 09/19/22 0639  WBC 4.3 7.2  HGB 14.1 13.2  HCT 44.8 40.3  MCV 84.8 82.2  PLT 292 818    Basic Metabolic Panel: Recent Labs  Lab 09/18/22 1021 09/19/22 0640 09/19/22 1215 09/20/22 0419 09/21/22 0129 09/21/22 1148 09/22/22 0612  NA 135  --  135 135 139 139  --   K 3.9  --  4.1 3.6 2.8* 3.6  --   CL 98  --  101 97* 103 105  --   CO2 25  --  24 25 27 26   --   GLUCOSE 248*  --  96 192* 107* 74  --   BUN 9  --  9 13 12 9   --   CREATININE 0.75  --  0.90 0.98 0.89 0.94  --   CALCIUM 8.3*  --  7.8* 7.7* 8.0* 7.9*  --   MG 1.2* 1.8  --   --  1.9 1.7 1.3*  PHOS  --   --   --   --   --  2.0*  --     GFR: Estimated Creatinine Clearance: 63.3 mL/min (by C-G formula based on SCr of 0.94 mg/dL). Liver Function Tests: Recent Labs  Lab 09/18/22 1021  AST 22  ALT 16  ALKPHOS 77  BILITOT 0.4  PROT 6.9  ALBUMIN 3.7    No results for input(s): "LIPASE", "AMYLASE" in the last 168 hours. Recent Labs  Lab 09/18/22 1040  AMMONIA 32    Coagulation Profile: No results for input(s): "INR", "PROTIME" in the last 168 hours. Cardiac Enzymes: No results for  input(s): "CKTOTAL", "CKMB", "CKMBINDEX", "TROPONINI" in the last 168 hours. BNP (last 3 results) No results for input(s): "PROBNP" in the last 8760 hours. HbA1C: No results for input(s): "HGBA1C" in the last 72 hours. CBG: Recent Labs  Lab 09/21/22 1246 09/21/22 1704 09/21/22 2118 09/22/22 0722 09/22/22 1134  GLUCAP 72 89 95 88 148*    Lipid Profile: No results for input(s): "CHOL", "HDL", "LDLCALC", "TRIG", "CHOLHDL", "LDLDIRECT" in the last 72 hours. Thyroid Function Tests: No results for input(s): "TSH", "T4TOTAL", "FREET4", "T3FREE", "THYROIDAB" in the last 72 hours.  Anemia Panel: No results for input(s): "VITAMINB12", "FOLATE", "FERRITIN", "TIBC", "IRON", "RETICCTPCT" in the last 72 hours.  Urine analysis:  Component Value Date/Time   COLORURINE STRAW (A) 09/18/2022 1100   APPEARANCEUR CLEAR 09/18/2022 1100   APPEARANCEUR Clear 12/21/2021 1054   LABSPEC 1.010 09/18/2022 1100   PHURINE 6.0 09/18/2022 1100   GLUCOSEU >=500 (A) 09/18/2022 1100   HGBUR SMALL (A) 09/18/2022 1100   BILIRUBINUR NEGATIVE 09/18/2022 1100   BILIRUBINUR negative 04/25/2022 0857   BILIRUBINUR Negative 12/21/2021 1054   KETONESUR NEGATIVE 09/18/2022 1100   PROTEINUR >=300 (A) 09/18/2022 1100   UROBILINOGEN 0.2 04/25/2022 0857   UROBILINOGEN 0.2 08/24/2013 1129   NITRITE NEGATIVE 09/18/2022 1100   LEUKOCYTESUR NEGATIVE 09/18/2022 1100   Sepsis Labs: @LABRCNTIP (procalcitonin:4,lacticidven:4)  ) Recent Results (from the past 240 hour(s))  Resp panel by RT-PCR (RSV, Flu A&B, Covid) Anterior Nasal Swab     Status: None   Collection Time: 09/18/22 11:00 AM   Specimen: Anterior Nasal Swab  Result Value Ref Range Status   SARS Coronavirus 2 by RT PCR NEGATIVE NEGATIVE Final    Comment: (NOTE) SARS-CoV-2 target nucleic acids are NOT DETECTED.  The SARS-CoV-2 RNA is generally detectable in upper respiratory specimens during the acute phase of infection. The lowest concentration of  SARS-CoV-2 viral copies this assay can detect is 138 copies/mL. A negative result does not preclude SARS-Cov-2 infection and should not be used as the sole basis for treatment or other patient management decisions. A negative result may occur with  improper specimen collection/handling, submission of specimen other than nasopharyngeal swab, presence of viral mutation(s) within the areas targeted by this assay, and inadequate number of viral copies(<138 copies/mL). A negative result must be combined with clinical observations, patient history, and epidemiological information. The expected result is Negative.  Fact Sheet for Patients:  EntrepreneurPulse.com.au  Fact Sheet for Healthcare Providers:  IncredibleEmployment.be  This test is no t yet approved or cleared by the [place] FDA and  has been authorized for detection and/or diagnosis of SARS-CoV-2 by FDA under an Emergency Use Authorization (EUA). This EUA will remain  in effect (meaning this test can be used) for the duration of the COVID-19 declaration under Section 564(b)(1) of the Act, 21 U.S.C.section 360bbb-3(b)(1), unless the authorization is terminated  or revoked sooner.       Influenza A by PCR NEGATIVE NEGATIVE Final   Influenza B by PCR NEGATIVE NEGATIVE Final    Comment: (NOTE) The Xpert Xpress SARS-CoV-2/FLU/RSV plus assay is intended as an aid in the diagnosis of influenza from Nasopharyngeal swab specimens and should not be used as a sole basis for treatment. Nasal washings and aspirates are unacceptable for Xpert Xpress SARS-CoV-2/FLU/RSV testing.  Fact Sheet for Patients: EntrepreneurPulse.com.au  Fact Sheet for Healthcare Providers: IncredibleEmployment.be  This test is not yet approved or cleared by the [place] FDA and has been authorized for detection and/or diagnosis of SARS-CoV-2 by FDA under an Emergency Use  Authorization (EUA). This EUA will remain in effect (meaning this test can be used) for the duration of the COVID-19 declaration under Section 564(b)(1) of the Act, 21 U.S.C. section 360bbb-3(b)(1), unless the authorization is terminated or revoked.     Resp Syncytial Virus by PCR NEGATIVE NEGATIVE Final    Comment: (NOTE) Fact Sheet for Patients: EntrepreneurPulse.com.au  Fact Sheet for Healthcare Providers: IncredibleEmployment.be  This test is not yet approved or cleared by the [place] FDA and has been authorized for detection and/or diagnosis of SARS-CoV-2 by FDA under an Emergency Use Authorization (EUA). This EUA will remain in effect (meaning this test can be used) for the  duration of the COVID-19 declaration under Section 564(b)(1) of the Act, 21 U.S.C. section 360bbb-3(b)(1), unless the authorization is terminated or revoked.  Performed at [facility], [address] [place], [place] [address]   MRSA Next Gen by PCR, Nasal     Status: None   Collection Time: 09/19/22  2:45 AM   Specimen: Nasal Mucosa; Nasal Swab  Result Value Ref Range Status   MRSA by PCR Next Gen NOT DETECTED NOT DETECTED Final    Comment: (NOTE) The GeneXpert MRSA Assay (FDA approved for NASAL specimens only), is one component of a comprehensive MRSA colonization surveillance program. It is not intended to diagnose MRSA infection nor to guide or monitor treatment for MRSA infections. Test performance is not FDA approved in patients less than 22 years old. Performed at [facility], [place] [address], [place], [place] [address]   Culture, blood (Routine X 2) w Reflex to ID Panel     Status: None (Preliminary result)   Collection Time: 09/19/22 12:15 PM   Specimen: BLOOD RIGHT HAND  Result Value Ref Range Status   Specimen Description BLOOD RIGHT HAND  Final   Special Requests IN PEDIATRIC BOTTLE Blood Culture adequate volume  Final   Culture   Final     NO GROWTH 3 DAYS Performed at [facility], [place] [address] [place], [place] [address]    Report Status PENDING  Incomplete  Culture, blood (Routine X 2) w Reflex to ID Panel     Status: None (Preliminary result)   Collection Time: 09/19/22  2:37 PM   Specimen: BLOOD RIGHT HAND  Result Value Ref Range Status   Specimen Description BLOOD RIGHT HAND  Final   Special Requests   Final    BOTTLES DRAWN AEROBIC AND ANAEROBIC Blood Culture adequate volume   Culture   Final    NO GROWTH 3 DAYS Performed at [facility], [place] [address] [place], [place] [address]    Report Status PENDING  Incomplete  CSF culture w Gram Stain     Status: None (Preliminary result)   Collection Time: 09/20/22  1:28 PM   Specimen: PATH Cytology CSF; Cerebrospinal Fluid  Result Value Ref Range Status   Specimen Description CSF  Final   Special Requests NONE  Final   Gram Stain   Final    WBC PRESENT, PREDOMINANTLY MONONUCLEAR NO ORGANISMS SEEN    Culture   Final    NO GROWTH 2 DAYS Performed at [facility], [place] [address] [place], [place] [address]    Report Status PENDING  Incomplete         Radiology Studies: DG CHEST PORT 1 VIEW  Result Date: 09/21/2022 CLINICAL DATA:  Short of breath. EXAM: PORTABLE CHEST 1 VIEW COMPARISON:  09/18/2022. FINDINGS: Cardiac silhouette is normal in size. Normal mediastinal and hilar contours. Lungs are clear.  No convincing pleural effusion.  No pneumothorax. Nasal/orogastric tube passes below the diaphragm. Skeletal structures are grossly intact. IMPRESSION: No active disease. Electronically Signed   By: Lajean Manes M.D.   On: 09/21/2022 11:09   DG FL GUIDED LUMBAR PUNCTURE  Result Date: 09/20/2022 CLINICAL DATA:  Provided history: Altered mental status. EXAM: DIAGNOSTIC LUMBAR PUNCTURE UNDER FLUOROSCOPIC GUIDANCE COMPARISON:  Lumbar spine CT 09/14/2022. FLUOROSCOPY: Fluoroscopy time: 42 seconds (6.50 mGy). PROCEDURE: Due to the patient's  altered mental status, Hayley Boisseau, PA-C obtained informed consent from the patient's niece Danford Bad) prior to the procedure. This process included  a discussion of procedural risks. The patient was positioned prone on the fluoroscopy table. An appropriate skin entry site was determined under fluoroscopy and marked. A time-out was performed. The operator donned sterile gloves and a mask. 1% lidocaine was used for local anesthesia. Subsequently, lumbar puncture was performed at the L2-L3 level using a 20 gauge spinal needle with return of clear CSF. 10 mL of CSF were collected and sent for laboratory studies. The patient tolerated the procedure well, and no immediate post-procedure complication was apparent. The procedure was performed by Pasty Spillers, PA-C, and was supervised and interpreted by Dr. Kellie Simmering. IMPRESSION: 1. Technically successful fluoroscopically-guided L2-L3 lumbar puncture. 2. 10 mL of CSF collected for laboratory studies. 3. No immediate post-procedure complication. Electronically Signed   By: Kellie Simmering D.O.   On: 09/20/2022 14:13        Scheduled Meds:  aspirin  81 mg Oral Daily   carvedilol  12.5 mg Oral BID WC   gabapentin  400 mg Oral TID   heparin  5,000 Units Subcutaneous Q8H   insulin aspart  0-15 Units Subcutaneous TID WC   insulin aspart  0-5 Units Subcutaneous QHS   insulin detemir  10 Units Subcutaneous QHS   levETIRAcetam  1,000 mg Oral BID   levothyroxine  88 mcg Oral Q0600   pantoprazole (PROTONIX) IV  40 mg Intravenous Q24H   pravastatin  40 mg Oral Daily   sacubitril-valsartan  1 tablet Oral BID   terbinafine  250 mg Oral Daily   vitamin B-12  1,500 mcg Oral Daily   Continuous Infusions:  sodium chloride 10 mL/hr at 09/20/22 2200   sodium chloride 125 mL/hr at 09/21/22 2147   levETIRAcetam     magnesium sulfate bolus IVPB       LOS: 4 days    Time spent: 55 minutes.    Dana Allan, MD  [facility] Pager #: [phone] 7PM-7AM contact night coverage as above

## 2022-09-22 NOTE — Progress Notes (Signed)
NEUROLOGY CONSULTATION PROGRESS NOTE   Date of service: September 22, 2022 Patient Name: Michael [name] MRN:  [id number] DOB:  1945-12-18  Brief HPI  Michael [name] is a 76 y.o. male with PMH significant for diabetes, hypertension who presents with altered mental status.  He lives at home but has a home health aide who checked on him today and found him to be very confused.  No Hx of seizure, ETOH or illicit drug use, prior head bleed, stroke, or other head trauma.  He fell 4 days ago and was seen with negative CT.   He was seen to have a generalized tonic-clonic seizure which started with right facial twitching and aborted with Ativan. He was loaded with Keppra 1049m IV. 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 He also has been hypertensive requiring cardene drip.  He was put on LTM EEG which initially demonstrated left hemispheric PLED + with overlying rhythmicity. He had significant aphasia on exam and was loaded with Fosphenytoin and continued maintenance Keppra 10057mBID.  LP under fluoro complete, elevated protein and glucose (only 1 RBC).  empiric meningitis coverage stopped due to no infection noted on CSF studies.   Interval Hx   No one at bedside during visit.  Overall improved since yesterday with confusion and motor.  Only mild confusion to president and year. Moving all extremities, full strength, able to follow instructions  - discussed with pt importance of having family members (along with in-home caregiver) to check in on him every day.  Could have been having seizures prior to this.  Need help monitoring if AED upon discharge is well managing any future seizures.   Vitals   Vitals:   09/22/22 0004 09/22/22 0451 09/22/22 0719 09/22/22 1142  BP: (!) 194/78 (!) 176/93 (!) 183/86 (!) 167/81  Pulse: 98 90 60 75  Resp: 11 16 18 17   Temp:  98.1 F (36.7 C) 97.7 F (36.5 C) 98 F (36.7 C)   TempSrc:  Oral Oral Oral  SpO2: 98% 99% 99% 100%  Weight:      Height:         Body mass index is 41.54 kg/m.  Physical Exam   General: Laying comfortably in bed; confusion has improved since yesterday, encephalopathic but not in distress HENT: Normal oropharynx and mucosa. Normal external appearance of ears and nose.  Neck: Supple, no pain or tenderness  CV: No JVD. No peripheral edema.  Pulmonary: Symmetric Chest rise. Normal respiratory effort.  Abdomen: Soft to touch, non-tender.  Ext: No cyanosis, + edema, and wrinkling and thickelning of skin on his BL legs. Skin: No rash. Normal palpation of skin.   Musculoskeletal: Normal digits and nails by inspection. No clubbing.   Neurologic Examination on precedex.  Mental status/Cognition: alert, oriented to self, place, month, disoriented to year. Speech/language: fluent, follows all commands, names objects.   Cranial nerves:   CN II Pupils equal and reactive to light, no VF deficits    CN III,IV,VI EOM intact, no gaze preference or deviation, no nystagmus    CN V normal sensation in V1, V2, and V3 segments bilaterally    CN VII no asymmetry, no nasolabial fold flattening    CN VIII normal hearing to speech    CN IX & X normal palatal elevation, no uvular deviation    CN XI Head midline.   CN XII midline tongue protrusion    Motor:  Muscle  bulk: normal, tone normal Moves bilateral upper extremities spontaneously and antigravity.  Can raise and hold lower extremities.  No drift in any extremities.  Sensation:  Light touch Intact throughout   Pin prick    Temperature    Vibration   Proprioception    Coordination/Complex Motor:  - Finger to Nose normal. - Heel to shin very slight ataxia bil - Rapid alternating movement normal - Gait: Deferred for patient safety Labs   Basic Metabolic Panel:  Lab Results  Component Value Date   NA 139 09/21/2022   K 3.6 09/21/2022   CO2 26 09/21/2022   GLUCOSE 74 09/21/2022    BUN 9 09/21/2022   CREATININE 0.94 09/21/2022   CALCIUM 7.9 (L) 09/21/2022   GFRNONAA >60 09/21/2022   GFRAA >60 01/25/2020   HbA1c:  Lab Results  Component Value Date   HGBA1C 6.3 (H) 06/20/2022   LDL:  Lab Results  Component Value Date   LDLCALC 56 06/20/2022   Urine Drug Screen: No results found for: "LABOPIA", "COCAINSCRNUR", "LABBENZ", "AMPHETMU", "THCU", "LABBARB"  Alcohol Level No results found for: "ETH" No results found for: "PHENYTOIN", "ZONISAMIDE", "LAMOTRIGINE", "LEVETIRACETA" No results found for: "PHENYTOIN", "PHENOBARB", "VALPROATE", "CBMZ"  Mag low 1.3 B12 low 168  CSF Studies (-)   Imaging and Diagnostic studies   MR BRAIN WO CONTRAST IMPRESSION: Motion limited study without definite evidence of acute intracranial abnormality.  MRI brain with and without contrast: On further review, there is equivocal/subtle DWI hyperintensity within the left hippocampus on this study as well as the prior same day MRI. No definite correlate edema on FLAIR. In this could potentially be artifactual; however, encephalitis (both autoimmune and viral) are differential considerations. Consider lumbar puncture for further evaluation.  LTM EEG: 12/21 - 12/22: This study showed evidence of epileptogenicity arising from left temporal region.  Additionally there is moderate to severe diffuse encephalopathy, nonspecific etiology. No definite seizures were seen during this study.   Impression   Michael [name] is a 76 y.o. male with PMH significant for diabetes, hypertension who presents with altered mental status.  He lives at home but has a home health aide who checked on him today and found him to be very confused.  He fell 4 days ago and was seen with negative CT.   He was seen to have a generalized tonic-clonic seizure which started with right facial twitching and aborted with Ativan. He was loaded with Keppra 1036m IV. 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 He also has be hypertensive requiring cardene drip.  He was put on LTM EEG which initially demonstrated left hemispheric PLED + with overlying rhythmicity. He had significant aphasia on exam and was loaded with Fosphenytoin and continued maintenance Keppra 10018mBID.  Aphasia significantly improved and EEG improved. Discontinued LTM EEG.  He was started on broad-spectrum antibiotics with Vanco, ceftriaxone, ampicillin, acyclovir.  LP under fluoro completed with unremarkable results so far and thus stopped all antimicrobial agents with persistent improvement in mentation.  Low B12 could be contributing to his encephalopathy.  Recommendations  - Convert Keppra IV to PO: 1000 mg twice daily - start B12 150069mdaily for serum deficiency -  Recommend daily Multivitamin PO tablet for concerns that he could be deficient in other vitamins too. - We will signoff. Please feel free to contact us [place] any questions or concerns.   ______________________________________________________________________   Thank you for the opportunity to take  part in the care of this patient. If you have any further questions, please contact the neurology consultation attending.  Signed,  Patient seen and examined by NP with MD. MD to update note as needed.   Dellia Beckwith, [facility]ist [phone] cell  To contact Stroke Continuity provider, please refer to [email or web address] After hours, contact General Neurology.If 7pm- 7am, please page neurology on call as listed in [place].   NEUROHOSPITALIST ADDENDUM Performed a face to face diagnostic evaluation.   I have reviewed the contents of history and physical exam as documented by PA/ARNP/Resident and agree with above documentation.  I have discussed and formulated the above plan as documented. Edits to the note have been made as needed.   Donnetta Simpers, MD [facility] [phone]   If 7pm to 7am, please call on call as listed on AMION.

## 2022-09-22 NOTE — Progress Notes (Incomplete)
Overnight event  Patient tachycardic and noted to be in new onset A-fib with RVR with rate in the 130s.  Blood pressure 157/64.  Possibly precipitated by hypokalemia as repeat labs showing potassium 2.6.  Potassium replacement has been ordered and will continue to monitor.  Magnesium 2.0.  TSH was normal on labs done yesterday.  Patient is asymptomatic.  PE less likely as patient is not hypoxic.  Last echo done 11 months ago showing EF 50-55%.  Patient has been started on IV Cardizem and repeat echocardiogram ordered.  He is already on Eliquis.  76 y.o. M with PMH significant for HFpEF, COPD, HL, DM, NASH, chronic lymphedem   Here for encephalopathy, hypertensive emergency

## 2022-09-23 ENCOUNTER — Inpatient Hospital Stay (HOSPITAL_COMMUNITY): Payer: Medicare Other

## 2022-09-23 DIAGNOSIS — I4891 Unspecified atrial fibrillation: Secondary | ICD-10-CM

## 2022-09-23 DIAGNOSIS — I169 Hypertensive crisis, unspecified: Secondary | ICD-10-CM | POA: Diagnosis not present

## 2022-09-23 LAB — BASIC METABOLIC PANEL
Anion gap: 13 (ref 5–15)
Anion gap: 6 (ref 5–15)
BUN: 5 mg/dL — ABNORMAL LOW (ref 8–23)
BUN: 5 mg/dL — ABNORMAL LOW (ref 8–23)
CO2: 24 mmol/L (ref 22–32)
CO2: 28 mmol/L (ref 22–32)
Calcium: 7.8 mg/dL — ABNORMAL LOW (ref 8.9–10.3)
Calcium: 7.8 mg/dL — ABNORMAL LOW (ref 8.9–10.3)
Chloride: 101 mmol/L (ref 98–111)
Chloride: 102 mmol/L (ref 98–111)
Creatinine, Ser: 0.86 mg/dL (ref 0.61–1.24)
Creatinine, Ser: 0.85 mg/dL (ref 0.61–1.24)
GFR, Estimated: >60 mL/min (ref >60)
GFR, Estimated: >60 mL/min (ref >60)
Glucose, Bld: 138 mg/dL — ABNORMAL HIGH (ref 70–99)
Glucose, Bld: 166 mg/dL — ABNORMAL HIGH (ref 70–99)
Potassium: 3.2 mmol/L — ABNORMAL LOW (ref 3.5–5.1)
Potassium: 3.8 mmol/L (ref 3.5–5.1)
Sodium: 138 mmol/L (ref 135–145)
Sodium: 136 mmol/L (ref 135–145)

## 2022-09-23 LAB — RENAL FUNCTION PANEL
Albumin: 2.7 g/dL — ABNORMAL LOW (ref 3.5–5.0)
Anion gap: 10 (ref 5–15)
BUN: <5 mg/dL — ABNORMAL LOW (ref 8–23)
CO2: 25 mmol/L (ref 22–32)
Calcium: 8 mg/dL — ABNORMAL LOW (ref 8.9–10.3)
Chloride: 104 mmol/L (ref 98–111)
Creatinine, Ser: 0.78 mg/dL (ref 0.61–1.24)
GFR, Estimated: >60 mL/min (ref >60)
Glucose, Bld: 111 mg/dL — ABNORMAL HIGH (ref 70–99)
Phosphorus: 1.4 mg/dL — ABNORMAL LOW (ref 2.5–4.6)
Potassium: 3.9 mmol/L (ref 3.5–5.1)
Sodium: 139 mmol/L (ref 135–145)

## 2022-09-23 LAB — CSF CULTURE W GRAM STAIN: Culture: NO GROWTH

## 2022-09-23 LAB — D-DIMER, QUANTITATIVE: D-Dimer, Quant: 0.71 ug/mL-FEU — ABNORMAL HIGH (ref 0.00–0.50)

## 2022-09-23 LAB — ECHOCARDIOGRAM COMPLETE
AR max vel: 1.34 cm2
AV Area VTI: 1.23 cm2
AV Area mean vel: 1.19 cm2
AV Mean grad: 4.5 mmHg
AV Peak grad: 9.4 mmHg
Ao pk vel: 1.53 m/s
Area-P 1/2: 3.36 cm2
Height: 59 in
S' Lateral: 3.3 cm
Weight: 3291.03 oz

## 2022-09-23 LAB — GLUCOSE, CAPILLARY
Glucose-Capillary: 120 mg/dL — ABNORMAL HIGH (ref 70–99)
Glucose-Capillary: 164 mg/dL — ABNORMAL HIGH (ref 70–99)
Glucose-Capillary: 124 mg/dL — ABNORMAL HIGH (ref 70–99)
Glucose-Capillary: 179 mg/dL — ABNORMAL HIGH (ref 70–99)

## 2022-09-23 LAB — MAGNESIUM: Magnesium: 1.8 mg/dL (ref 1.7–2.4)

## 2022-09-23 LAB — PHOSPHORUS: Phosphorus: 3.5 mg/dL (ref 2.5–4.6)

## 2022-09-23 MED ORDER — POTASSIUM PHOSPHATES 15 MMOLE/5ML IV SOLN
30.0000 mmol | Freq: Once | INTRAVENOUS | Status: AC
  Administered 2022-09-23: 30 mmol via INTRAVENOUS
  Filled 2022-09-23: qty 10

## 2022-09-23 MED ORDER — POTASSIUM & SODIUM PHOSPHATES 280-160-250 MG PO PACK
1.0000 | PACK | Freq: Three times a day (TID) | ORAL | Status: DC
  Administered 2022-09-23 – 2022-10-01 (×33): 1 via ORAL
  Filled 2022-09-23 (×38): qty 1

## 2022-09-23 MED ORDER — GERHARDT'S BUTT CREAM
TOPICAL_CREAM | Freq: Every day | CUTANEOUS | Status: DC | PRN
  Administered 2022-09-29: 1 via TOPICAL
  Filled 2022-09-23 (×3): qty 1

## 2022-09-23 MED ORDER — IOHEXOL 350 MG/ML SOLN
75.0000 mL | Freq: Once | INTRAVENOUS | Status: AC | PRN
  Administered 2022-09-23: 75 mL via INTRAVENOUS

## 2022-09-23 MED ORDER — POTASSIUM PHOSPHATES 15 MMOLE/5ML IV SOLN
30.0000 mmol | Freq: Once | INTRAVENOUS | Status: AC
  Filled 2022-09-23: qty 10

## 2022-09-23 MED ORDER — METOPROLOL TARTRATE 5 MG/5ML IV SOLN
5.0000 mg | Freq: Once | INTRAVENOUS | Status: AC
  Administered 2022-09-23: 5 mg via INTRAVENOUS
  Filled 2022-09-23: qty 5

## 2022-09-23 NOTE — Progress Notes (Signed)
Echocardiogram not complete due to personal care.  Darlina Sicilian RDCS

## 2022-09-23 NOTE — Progress Notes (Signed)
Episode of Afib RVR with HR up to 180's. BP = 159/81. EKG done, but HR back to 90's by that time. On Cardizem gtt @ 15 mg/hr. Carvedilol 12.5 mg given. Has been having some runs of NSVT up to 15 beats today. Almyra Deforest, PA notified via secure chat.

## 2022-09-23 NOTE — Progress Notes (Signed)
PROGRESS NOTE    Michael [name]  FBP:[id number] DOB: Mar 22, 1946 DOA: 09/18/2022 PCP: Lindell Spar, MD  Outpatient Specialists:     Brief Narrative:  As per H&P done on admission: "Michael [name] is a 76 y.o. M with PMH significant for HFpEF, COPD, HL, DM, NASH, chronic lymphedema who presented to [facility] with AMS.  He had a fall four days prior to admission and was evaluated in the ED with negative head CT.  Home health went to check on patient and found him confused and called EMS.  He was initially hypertensive with BP 220/92.  While in the ED pt had a tonic-clonic seizure which ceased with Ativan.  Head CT again negative.  An MRI brain was obtained showing equivocal/subtle DWI hyperintensity within the left hippocampus could be consistent with encephalitis, no evidence of PRES.   Neurology was consulted, findings could be seen in the setting of HSV encephalitis or post-ictally in the absence  of infection.   An LP was attempted, but not successful given history of surgery at L4-L5.   Neurology recommended transfer to [facility] for IR to perform LP and for continuous EEG.    His labs were significant for Mag 1.2, no leukocytosis and normal renal function.  He was loaded with Keppra and given Acyclovir and transferred to [facility]"  09/21/2022: CSF fluid analysis revealed 0 WBC.  Antibiotics and acyclovir have been discontinued.  Patient seen alongside patient's nurse.  Patient remains confused.  No significant history from patient.  Patient has pulled NG tube severally.  For bedside swallowing evaluation by the nurse.  09/22/2022: Patient seen.  Patient remains significantly confused.  Patient developed A-fib RVR later in the day.  Patient was started Cardizem drip and Eliquis.  Low threshold to consult the cardiology team.  Likely, patient may have paroxysmal atrial fibrillation.  09/23/2022: Heart rate is controlled on Cardizem drip.  Mildly elevated D-dimer.  No recent echocardiogram.  Patient seen  alongside patient's niece.  Patient has no children.  Patient's niece is next of kin.  There are concerns about disposition.  Will consult TOC team.  Pursue echocardiogram and CTA chest.  Low threshold to consult the cardiology team.  Confusion is improving.   Assessment & Plan:   Principal Problem:   Hypertensive crisis Active Problems:   Disorientation  Acute Encephalopathy 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  -Transfer to [facility], neurology consulted for likely LTM EEG -seizure precautions, prn Ativan -IR consult in the AM for LP -continue Acyclovir -loaded with Keppra, prn ativan -seizure precautions -no history of ETOH use -check b12 level 09/21/2022: See above documentation.  CSF analysis is nonrevealing.  Antibiotics and acyclovir have been discontinued.  Neurology input is appreciated.  LTM EEG did not reveal any definite seizure.  Patient is still on antiseizure medications.  Patient remains confused. 09/22/2022: Patient remains significantly confused. 09/23/2022: Slowly improving.  A-fib with RVR: -Start Cardizem drip. -Start Eliquis. -Low threshold to reconsult cardiology team. -Low threshold to repeat echocardiogram. -Normal TSH. -Check D-dimer. 09/23/2022: D-dimer is minimally elevated.  CTA chest.  Echocardiogram.  Low threshold to consult the cardiology team.  Heart rate is controlled.  Continue Eliquis.     Hypertensive Emergency HFpEF HL Recovered EF, last 55% -cardene gtt, add home oral medications when patient can safely swallow -continue Entresto and statin when cleared for po's 09/22/2022: Blood pressure control is slowly improving. 09/23/2022:  Heart rate is controlled.     Hypomagnesemia Mag 1.2  -received 2g IV, give an additional 2g now and repeat level in the AM 09/22/2022: Last magnesium level was 2.  Continue to monitor  and replete 09/23/2022: Magnesium 1.8 today.  Hypophosphatemia: -Phosphorus is 1.4. -Monitor and replete.     Hypothyroidism -continue synthroid and check TSH 09/21/2022: TSH was 1.056.  Hypoalbuminemia: -This may have prognostic significance. -Cannot rule out moderate malnutrition/PEM.     Type 2 DM -SSI     Chronic lymphedema Chronic pain   DVT prophylaxis: Eliquis Code Status: Full code Family Communication:  Disposition Plan: This will depend on hospital course   Consultants:  Neurology  Procedures:  LTM EEG  Antimicrobials:  None   Subjective: No new complaints. Confusion is improving.     Objective: Vitals:   09/22/22 2355 09/23/22 0021 09/23/22 0250 09/23/22 0430  BP:  120/71 (!) 148/81 135/86  Pulse: 97 84 (!) 46 96  Resp: (!) 22 18 18 18   Temp:  97.7 F (36.5 C)  98.5 F (36.9 C)  TempSrc:  Oral  Oral  SpO2: 100% 98% 99% 97%  Weight:      Height:        Intake/Output Summary (Last 24 hours) at 09/23/2022 0800 Last data filed at 09/23/2022 0240 Gross per 24 hour  Intake 4310.46 ml  Output 3700 ml  Net 610.46 ml    Filed Weights   09/19/22 0600 09/20/22 0500 09/21/22 0000  Weight: 85.7 kg 87.3 kg 93.3 kg    Examination:  General exam: Appears calm and comfortable.  Remains confused Respiratory system: Clear to auscultation.   Cardiovascular system: S1 & S2 heard  Gastrointestinal system: Abdomen is obese, soft and non tender. . Central nervous system: Confusion is improving.  Data Reviewed: I have personally reviewed following labs and imaging studies  CBC: Recent Labs  Lab 09/18/22 1021 09/19/22 0639  WBC 4.3 7.2  HGB 14.1 13.2  HCT 44.8 40.3  MCV 84.8 82.2  PLT 292 973    Basic Metabolic Panel: Recent Labs  Lab 09/21/22 0129 09/21/22 1148 09/22/22 0612 09/22/22 1216 09/22/22 2121 09/23/22 0143 09/23/22 0546  NA 139 139  --  137 137 138 139  K 2.8* 3.6  --  3.2* 2.6* 3.2* 3.9  CL 103 105  --  101 99 101  104  CO2 27 26  --  24 24 24 25   GLUCOSE 107* 74  --  86 162* 138* 111*  BUN 12 9  --  6* 5* 5* <5*  CREATININE 0.89 0.94  --  0.75 1.00 0.86 0.78  CALCIUM 8.0* 7.9*  --  8.0* 7.9* 7.8* 8.0*  MG 1.9 1.7 1.3*  --  2.0  --  1.8  PHOS  --  2.0*  --  1.7* 1.7*  --  1.4*    GFR: Estimated Creatinine Clearance: 74.4 mL/min (by C-G formula based on SCr of 0.78 mg/dL). Liver Function Tests: Recent Labs  Lab 09/18/22 1021 09/22/22 1216 09/22/22 2121 09/23/22 0546  AST 22  --   --   --   ALT 16  --   --   --   ALKPHOS 77  --   --   --   BILITOT 0.4  --   --   --   PROT 6.9  --   --   --   ALBUMIN 3.7 2.7* 2.6* 2.7*    No results for input(s): "LIPASE", "AMYLASE" in the  last 168 hours. Recent Labs  Lab 09/18/22 1040  AMMONIA 32    Coagulation Profile: No results for input(s): "INR", "PROTIME" in the last 168 hours. Cardiac Enzymes: No results for input(s): "CKTOTAL", "CKMB", "CKMBINDEX", "TROPONINI" in the last 168 hours. BNP (last 3 results) No results for input(s): "PROBNP" in the last 8760 hours. HbA1C: No results for input(s): "HGBA1C" in the last 72 hours. CBG: Recent Labs  Lab 09/22/22 0722 09/22/22 1134 09/22/22 1304 09/22/22 1632 09/22/22 2104  GLUCAP 88 148* 133* 99 168*    Lipid Profile: No results for input(s): "CHOL", "HDL", "LDLCALC", "TRIG", "CHOLHDL", "LDLDIRECT" in the last 72 hours. Thyroid Function Tests: No results for input(s): "TSH", "T4TOTAL", "FREET4", "T3FREE", "THYROIDAB" in the last 72 hours.  Anemia Panel: No results for input(s): "VITAMINB12", "FOLATE", "FERRITIN", "TIBC", "IRON", "RETICCTPCT" in the last 72 hours.  Urine analysis:    Component Value Date/Time   COLORURINE STRAW (A) 09/18/2022 1100   APPEARANCEUR CLEAR 09/18/2022 1100   APPEARANCEUR Clear 12/21/2021 1054   LABSPEC 1.010 09/18/2022 1100   PHURINE 6.0 09/18/2022 1100   GLUCOSEU >=500 (A) 09/18/2022 1100   HGBUR SMALL (A) 09/18/2022 1100   BILIRUBINUR NEGATIVE  09/18/2022 1100   BILIRUBINUR negative 04/25/2022 0857   BILIRUBINUR Negative 12/21/2021 1054   KETONESUR NEGATIVE 09/18/2022 1100   PROTEINUR >=300 (A) 09/18/2022 1100   UROBILINOGEN 0.2 04/25/2022 0857   UROBILINOGEN 0.2 08/24/2013 1129   NITRITE NEGATIVE 09/18/2022 1100   LEUKOCYTESUR NEGATIVE 09/18/2022 1100   Sepsis Labs: @LABRCNTIP (procalcitonin:4,lacticidven:4)  ) Recent Results (from the past 240 hour(s))  Resp panel by RT-PCR (RSV, Flu A&B, Covid) Anterior Nasal Swab     Status: None   Collection Time: 09/18/22 11:00 AM   Specimen: Anterior Nasal Swab  Result Value Ref Range Status   SARS Coronavirus 2 by RT PCR NEGATIVE NEGATIVE Final    Comment: (NOTE) SARS-CoV-2 target nucleic acids are NOT DETECTED.  The SARS-CoV-2 RNA is generally detectable in upper respiratory specimens during the acute phase of infection. The lowest concentration of SARS-CoV-2 viral copies this assay can detect is 138 copies/mL. A negative result does not preclude SARS-Cov-2 infection and should not be used as the sole basis for treatment or other patient management decisions. A negative result may occur with  improper specimen collection/handling, submission of specimen other than nasopharyngeal swab, presence of viral mutation(s) within the areas targeted by this assay, and inadequate number of viral copies(<138 copies/mL). A negative result must be combined with clinical observations, patient history, and epidemiological information. The expected result is Negative.  Fact Sheet for Patients:  EntrepreneurPulse.com.au  Fact Sheet for Healthcare Providers:  IncredibleEmployment.be  This test is no t yet approved or cleared by the [place] FDA and  has been authorized for detection and/or diagnosis of SARS-CoV-2 by FDA under an Emergency Use Authorization (EUA). This EUA will remain  in effect (meaning this test can be used) for the duration of  the COVID-19 declaration under Section 564(b)(1) of the Act, 21 U.S.C.section 360bbb-3(b)(1), unless the authorization is terminated  or revoked sooner.       Influenza A by PCR NEGATIVE NEGATIVE Final   Influenza B by PCR NEGATIVE NEGATIVE Final    Comment: (NOTE) The Xpert Xpress SARS-CoV-2/FLU/RSV plus assay is intended as an aid in the diagnosis of influenza from Nasopharyngeal swab specimens and should not be used as a sole basis for treatment. Nasal washings and aspirates are unacceptable for Xpert Xpress SARS-CoV-2/FLU/RSV testing.  Fact Sheet for Patients:  EntrepreneurPulse.com.au  Fact Sheet for Healthcare Providers: IncredibleEmployment.be  This test is not yet approved or cleared by the [place] FDA and has been authorized for detection and/or diagnosis of SARS-CoV-2 by FDA under an Emergency Use Authorization (EUA). This EUA will remain in effect (meaning this test can be used) for the duration of the COVID-19 declaration under Section 564(b)(1) of the Act, 21 U.S.C. section 360bbb-3(b)(1), unless the authorization is terminated or revoked.     Resp Syncytial Virus by PCR NEGATIVE NEGATIVE Final    Comment: (NOTE) Fact Sheet for Patients: EntrepreneurPulse.com.au  Fact Sheet for Healthcare Providers: IncredibleEmployment.be  This test is not yet approved or cleared by the [place] FDA and has been authorized for detection and/or diagnosis of SARS-CoV-2 by FDA under an Emergency Use Authorization (EUA). This EUA will remain in effect (meaning this test can be used) for the duration of the COVID-19 declaration under Section 564(b)(1) of the Act, 21 U.S.C. section 360bbb-3(b)(1), unless the authorization is terminated or revoked.  Performed at [facility], [address] [place], [place] [address]   MRSA Next Gen by PCR, Nasal     Status: None   Collection Time: 09/19/22  2:45  AM   Specimen: Nasal Mucosa; Nasal Swab  Result Value Ref Range Status   MRSA by PCR Next Gen NOT DETECTED NOT DETECTED Final    Comment: (NOTE) The GeneXpert MRSA Assay (FDA approved for NASAL specimens only), is one component of a comprehensive MRSA colonization surveillance program. It is not intended to diagnose MRSA infection nor to guide or monitor treatment for MRSA infections. Test performance is not FDA approved in patients less than 78 years old. Performed at [facility], [place] [address], [place], [place] [address]   Culture, blood (Routine X 2) w Reflex to ID Panel     Status: None (Preliminary result)   Collection Time: 09/19/22 12:15 PM   Specimen: BLOOD RIGHT HAND  Result Value Ref Range Status   Specimen Description BLOOD RIGHT HAND  Final   Special Requests IN PEDIATRIC BOTTLE Blood Culture adequate volume  Final   Culture   Final    NO GROWTH 3 DAYS Performed at [facility], [place] [address] [place], [place] [address]    Report Status PENDING  Incomplete  Culture, blood (Routine X 2) w Reflex to ID Panel     Status: None (Preliminary result)   Collection Time: 09/19/22  2:37 PM   Specimen: BLOOD RIGHT HAND  Result Value Ref Range Status   Specimen Description BLOOD RIGHT HAND  Final   Special Requests   Final    BOTTLES DRAWN AEROBIC AND ANAEROBIC Blood Culture adequate volume   Culture   Final    NO GROWTH 3 DAYS Performed at [facility], [place] [address] [place], [place] [address]    Report Status PENDING  Incomplete  CSF culture w Gram Stain     Status: None (Preliminary result)   Collection Time: 09/20/22  1:28 PM   Specimen: PATH Cytology CSF; Cerebrospinal Fluid  Result Value Ref Range Status   Specimen Description CSF  Final   Special Requests NONE  Final   Gram Stain   Final    WBC PRESENT, PREDOMINANTLY MONONUCLEAR NO ORGANISMS SEEN    Culture   Final    NO GROWTH 2 DAYS Performed at [facility], [place] [address]  [place],  [address]    Report Status PENDING  Incomplete  Radiology Studies: DG CHEST PORT 1 VIEW  Result Date: 09/21/2022 CLINICAL DATA:  Short of breath. EXAM: PORTABLE CHEST 1 VIEW COMPARISON:  09/18/2022. FINDINGS: Cardiac silhouette is normal in size. Normal mediastinal and hilar contours. Lungs are clear.  No convincing pleural effusion.  No pneumothorax. Nasal/orogastric tube passes below the diaphragm. Skeletal structures are grossly intact. IMPRESSION: No active disease. Electronically Signed   By: Lajean Manes M.D.   On: 09/21/2022 11:09        Scheduled Meds:  apixaban  5 mg Oral BID   aspirin  81 mg Oral Daily   carvedilol  12.5 mg Oral BID WC   gabapentin  400 mg Oral TID   insulin aspart  0-15 Units Subcutaneous TID WC   insulin aspart  0-5 Units Subcutaneous QHS   insulin detemir  10 Units Subcutaneous QHS   levETIRAcetam  1,000 mg Oral BID   levothyroxine  88 mcg Oral Q0600   multivitamin with minerals  1 tablet Oral Daily   pantoprazole (PROTONIX) IV  40 mg Intravenous Q24H   potassium & sodium phosphates  1 packet Oral TID WC & HS   pravastatin  40 mg Oral Daily   sacubitril-valsartan  1 tablet Oral BID   terbinafine  250 mg Oral Daily   vitamin B-12  1,500 mcg Oral Daily   Continuous Infusions:  sodium chloride 10 mL/hr at 09/20/22 2200   sodium chloride Stopped (09/23/22 0430)   diltiazem (CARDIZEM) infusion 15 mg/hr (09/23/22 0522)   levETIRAcetam     potassium PHOSPHATE IVPB (in mmol) 30 mmol (09/23/22 0734)   potassium PHOSPHATE IVPB (in mmol)       LOS: 5 days    Time spent: 55 minutes.    Dana Allan, MD  [facility] Pager #: [phone] 7PM-7AM contact night coverage as above

## 2022-09-23 NOTE — Plan of Care (Signed)
  Problem: Clinical Measurements: Goal: Ability to maintain clinical measurements within normal limits will improve Outcome: Progressing Goal: Cardiovascular complication will be avoided Outcome: Progressing   

## 2022-09-23 NOTE — Progress Notes (Signed)
  Echocardiogram 2D Echocardiogram has been performed.  Michael [name] M 09/23/2022, 3:11 PM

## 2022-09-24 ENCOUNTER — Other Ambulatory Visit (HOSPITAL_COMMUNITY): Payer: Self-pay

## 2022-09-24 DIAGNOSIS — I169 Hypertensive crisis, unspecified: Secondary | ICD-10-CM | POA: Diagnosis not present

## 2022-09-24 LAB — COMPREHENSIVE METABOLIC PANEL
ALT: 20 U/L (ref 0–44)
AST: 19 U/L (ref 15–41)
Albumin: 2.6 g/dL — ABNORMAL LOW (ref 3.5–5.0)
Alkaline Phosphatase: 58 U/L (ref 38–126)
Anion gap: 10 (ref 5–15)
BUN: <5 mg/dL — ABNORMAL LOW (ref 8–23)
CO2: 25 mmol/L (ref 22–32)
Calcium: 8 mg/dL — ABNORMAL LOW (ref 8.9–10.3)
Chloride: 103 mmol/L (ref 98–111)
Creatinine, Ser: 0.69 mg/dL (ref 0.61–1.24)
GFR, Estimated: >60 mL/min (ref >60)
Glucose, Bld: 153 mg/dL — ABNORMAL HIGH (ref 70–99)
Potassium: 3.3 mmol/L — ABNORMAL LOW (ref 3.5–5.1)
Sodium: 138 mmol/L (ref 135–145)
Total Bilirubin: 0.7 mg/dL (ref 0.3–1.2)
Total Protein: 4.9 g/dL — ABNORMAL LOW (ref 6.5–8.1)

## 2022-09-24 LAB — CBC WITH DIFFERENTIAL/PLATELET
Abs Immature Granulocytes: 0.01 10*3/uL (ref 0.00–0.07)
Basophils Absolute: 0 10*3/uL (ref 0.0–0.1)
Basophils Relative: 1 %
Eosinophils Absolute: 0.2 10*3/uL (ref 0.0–0.5)
Eosinophils Relative: 4 %
HCT: 37.9 % — ABNORMAL LOW (ref 39.0–52.0)
Hemoglobin: 12.6 g/dL — ABNORMAL LOW (ref 13.0–17.0)
Immature Granulocytes: 0 %
Lymphocytes Relative: 18 %
Lymphs Abs: 0.8 10*3/uL (ref 0.7–4.0)
MCH: 27.5 pg (ref 26.0–34.0)
MCHC: 33.2 g/dL (ref 30.0–36.0)
MCV: 82.6 fL (ref 80.0–100.0)
Monocytes Absolute: 0.4 10*3/uL (ref 0.1–1.0)
Monocytes Relative: 9 %
Neutro Abs: 3.1 10*3/uL (ref 1.7–7.7)
Neutrophils Relative %: 68 %
Platelets: 171 10*3/uL (ref 150–400)
RBC: 4.59 MIL/uL (ref 4.22–5.81)
RDW: 15.7 % — ABNORMAL HIGH (ref 11.5–15.5)
WBC: 4.5 10*3/uL (ref 4.0–10.5)
nRBC: 0 % (ref 0.0–0.2)

## 2022-09-24 LAB — GLUCOSE, CAPILLARY
Glucose-Capillary: 146 mg/dL — ABNORMAL HIGH (ref 70–99)
Glucose-Capillary: 170 mg/dL — ABNORMAL HIGH (ref 70–99)
Glucose-Capillary: 221 mg/dL — ABNORMAL HIGH (ref 70–99)
Glucose-Capillary: 217 mg/dL — ABNORMAL HIGH (ref 70–99)

## 2022-09-24 LAB — MAGNESIUM: Magnesium: 1.5 mg/dL — ABNORMAL LOW (ref 1.7–2.4)

## 2022-09-24 LAB — CULTURE, BLOOD (ROUTINE X 2)
Culture: NO GROWTH
Culture: NO GROWTH
Special Requests: ADEQUATE
Special Requests: ADEQUATE

## 2022-09-24 MED ORDER — BUMETANIDE 2 MG PO TABS
3.0000 mg | ORAL_TABLET | Freq: Two times a day (BID) | ORAL | Status: DC
  Administered 2022-09-24 – 2022-09-28 (×8): 3 mg via ORAL
  Filled 2022-09-24 (×8): qty 1

## 2022-09-24 MED ORDER — CARVEDILOL 25 MG PO TABS
25.0000 mg | ORAL_TABLET | Freq: Two times a day (BID) | ORAL | Status: DC
  Administered 2022-09-24 – 2022-09-28 (×8): 25 mg via ORAL
  Filled 2022-09-24 (×8): qty 1

## 2022-09-24 MED ORDER — POTASSIUM CHLORIDE CRYS ER 20 MEQ PO TBCR
40.0000 meq | EXTENDED_RELEASE_TABLET | Freq: Three times a day (TID) | ORAL | Status: AC
  Administered 2022-09-24 – 2022-09-25 (×3): 40 meq via ORAL
  Filled 2022-09-24 (×3): qty 2

## 2022-09-24 MED ORDER — MAGNESIUM SULFATE 2 GM/50ML IV SOLN
2.0000 g | Freq: Once | INTRAVENOUS | Status: AC
  Administered 2022-09-24: 2 g via INTRAVENOUS
  Filled 2022-09-24: qty 50

## 2022-09-24 NOTE — Evaluation (Signed)
Occupational Therapy Evaluation Patient Details Name: Michael [name] MRN: [id number] DOB: 1946-02-02 Today's Date: 09/24/2022   History of Present Illness Pt is a 76 y/o M presenting to [facility] on 12/10 for evaluation on AMS, recent fall x4 days ago and seen in ED/discharged after head CT negative for acute findings. Seizure in ED on 12/20. MRI brain revealing hyperintensity in L hippocampus consistent with encephalitis. Transfer to [facility] for LTM EEG. PMH includes COPD, HTN, DM2, CHF, NASH, BLE edema, and chronic lymphedema.   Clinical Impression   Pt reports independence at baseline with ADLs and uses w/c for mobility, lives alone but has niece available for PRN assist. Pt currently needing set up - mod A for ADLs, min guard for bed mobility, and min guard for lateral scoot transfers. Pt reports only laterally scooting to transfer at home. Pt with decreased cognition, asking multiple times why he is here and which hospital he is at despite orientation at start of session. Pt presenting with impairments listed below, will follow acutely. Recommend SNF at d/c unless family able to provide level of assist needed for home, then could go home with [facility].      Recommendations for follow up therapy are one component of a multi-disciplinary discharge planning process, led by the attending physician.  Recommendations may be updated based on patient status, additional functional criteria and insurance authorization.   Follow Up Recommendations  Skilled nursing-short term rehab (<3 hours/day) (unless family able to provide level of assist needed, then could home with [facility])     Assistance Recommended at Discharge Frequent or constant Supervision/Assistance  Patient can return home with the following A little help with bathing/dressing/bathroom;Assistance with cooking/housework;Direct supervision/assist for medications management;Direct supervision/assist for financial management;Assist for transportation;Help  with stairs or ramp for entrance    Functional Status Assessment  Patient has had a recent decline in their functional status and demonstrates the ability to make significant improvements in function in a reasonable and predictable amount of time.  Equipment Recommendations  None recommended by OT (pt has all needed DME)    Recommendations for Other Services PT consult     Precautions / Restrictions Precautions Precautions: Fall Restrictions Weight Bearing Restrictions: No      Mobility Bed Mobility Overal bed mobility: Needs Assistance Bed Mobility: Supine to Sit     Supine to sit: Min guard          Transfers Overall transfer level: Needs assistance Equipment used: None Transfers: Bed to chair/wheelchair/BSC            Lateral/Scoot Transfers: Min guard General transfer comment: laterally scooted to L side to drop arm recliner      Balance Overall balance assessment: Needs assistance Sitting-balance support: Feet supported Sitting balance-Leahy Scale: Fair                                     ADL either performed or assessed with clinical judgement   ADL Overall ADL's : Needs assistance/impaired Eating/Feeding: Set up   Grooming: Set up   Upper Body Bathing: Minimal assistance   Lower Body Bathing: Moderate assistance   Upper Body Dressing : Minimal assistance   Lower Body Dressing: Moderate assistance   Toilet Transfer: Min guard   Toileting- Clothing Manipulation and Hygiene: Min guard       Functional mobility during ADLs: Min guard       Vision Baseline Vision/History: 1  Wears glasses Additional Comments: reports not wearing glasses all the time, intermittent blurry vision per pt report     Perception Perception Perception Tested?: No   Praxis Praxis Praxis tested?: Not tested    Pertinent Vitals/Pain Pain Assessment Pain Assessment: No/denies pain     Hand Dominance Left   Extremity/Trunk Assessment  Upper Extremity Assessment Upper Extremity Assessment: Overall WFL for tasks assessed   Lower Extremity Assessment Lower Extremity Assessment: Defer to PT evaluation   Cervical / Trunk Assessment Cervical / Trunk Assessment: Normal   Communication Communication Communication: No difficulties   Cognition Arousal/Alertness: Awake/alert Behavior During Therapy: WFL for tasks assessed/performed Overall Cognitive Status: Impaired/Different from baseline Area of Impairment: Orientation, Attention, Memory, Following commands, Awareness, Safety/judgement, Problem solving                 Orientation Level: Disoriented to, Situation, Place Current Attention Level: Focused Memory: Decreased short-term memory Following Commands: Follows one step commands with increased time Safety/Judgement: Decreased awareness of safety Awareness: Emergent Problem Solving: Slow processing, Decreased initiation, Requires verbal cues General Comments: asking 3x during session if he is at [facility] despite being told at start of session, unable to recall why he is admitted, despite being told.     General Comments  VSS on RA    Exercises     Shoulder Instructions      Home Living Family/patient expects to be discharged to:: Private residence Living Arrangements: Alone   Type of Home: Apartment Home Access: Level entry     Home Layout: One level     Bathroom Shower/Tub: Chief Strategy Officer: Wheelchair - manual;BSC/3in1;Shower seat;Adaptive equipment Adaptive Equipment: Reacher        Prior Functioning/Environment Prior Level of Function : Independent/Modified Independent             Mobility Comments: uses w/c baseline, lateral scoots to transfer ADLs Comments: has assist for IADLs from neice        OT Problem List: Decreased strength;Decreased range of motion;Decreased activity tolerance;Decreased safety awareness;Decreased cognition;Impaired balance (sitting  and/or standing)      OT Treatment/Interventions: Self-care/ADL training;Therapeutic exercise;Energy conservation;DME and/or AE instruction;Therapeutic activities;Patient/family education;Balance training;Cognitive remediation/compensation    OT Goals(Current goals can be found in the care plan section) Acute Rehab OT Goals Patient Stated Goal: none stated OT Goal Formulation: With patient Time For Goal Achievement: 10/08/22 Potential to Achieve Goals: Good  OT Frequency: Min 2X/week    Co-evaluation PT/OT/SLP Co-Evaluation/Treatment: Yes Reason for Co-Treatment: Necessary to address cognition/behavior during functional activity;To address functional/ADL transfers;For patient/therapist safety   OT goals addressed during session: ADL's and self-care;Strengthening/ROM      AM-PAC OT "6 Clicks" Daily Activity     Outcome Measure Help from another person eating meals?: A Little Help from another person taking care of personal grooming?: A Little Help from another person toileting, which includes using toliet, bedpan, or urinal?: A Little Help from another person bathing (including washing, rinsing, drying)?: A Lot Help from another person to put on and taking off regular upper body clothing?: A Little Help from another person to put on and taking off regular lower body clothing?: A Lot 6 Click Score: 16   End of Session Nurse Communication: Mobility status  Activity Tolerance: Patient tolerated treatment well Patient left: in chair;with call bell/phone within reach  OT Visit Diagnosis: Unsteadiness on feet (R26.81);Other abnormalities of gait and mobility (R26.89);Muscle weakness (generalized) (M62.81)  Time: 1300-1319 OT Time Calculation (min): 19 min Charges:  OT General Charges $OT Visit: 1 Visit OT Evaluation $OT Eval Moderate Complexity: 1 Mod  Rehaan Viloria K, OTD, OTR/L SecureChat Preferred Acute Rehab [phone]  Renaye Rakers Koonce 09/24/2022, 1:34  PM

## 2022-09-24 NOTE — TOC Benefit Eligibility Note (Signed)
Patient Teacher, English as a foreign language completed.    The patient is currently admitted and upon discharge could be taking Eliquis 5 mg.  The current 30 day co-pay is $47.00.   The patient is insured through [place], [place] Patient Advocate Specialist [place] Patient Advocate Team Direct Number: [phone]  Fax: [phone]

## 2022-09-24 NOTE — Progress Notes (Signed)
Mobility Specialist - Progress Note   09/24/22 1512  Mobility  Activity Transferred from chair to bed  Level of Assistance +2 (takes two people)  Assistive Device None  Activity Response Tolerated well  Mobility Referral Yes  $Mobility charge 1 Mobility   Pt received in chair requesting assistance back to bed. Pt was +2 for transfer back to bed. Pt was left in bed on bed pain with all needs met.   Michael [name]  Mobility Specialist Please contact via Solicitor or Rehab office at [phone]

## 2022-09-24 NOTE — Care Management Important Message (Signed)
Important Message  Patient Details  Name: Michael [name] MRN: [id number] Date of Birth: [birth year]   Medicare Important Message Given:  Yes     [name] 09/24/2022, 3:52 PM

## 2022-09-24 NOTE — Progress Notes (Signed)
Left vm for patient's niece to discuss SNF recommendation.   Gilmore Laroche, MSW, [facility]

## 2022-09-24 NOTE — TOC Initial Note (Signed)
Transition of Care [facility]) - Initial/Assessment Note    Patient Details  Name: Michael [name] MRN: [id number] Date of Birth: 1946-01-18  [facility] CM/SW Contact:    Benard Halsted, LCSW Phone Number: 09/24/2022, 4:42 PM  Clinical Narrative:                 CSW received return call from patient's niece, [name]. She stated she is patient's only living relative that can assist him. She recently placed her mom in a memory care in [facility] and was able to get her approved for Medicaid but stated she tried to get patient approved for Special Assistance Medicaid for ALF but he makes $1400/month SSI and gets $900/month for the [place] [facility]. CSW encouraged her to follow up with [facility] (she has a contact named Lattie Haw) to see if patient goes to SNF if he is able to revoke the extra [place] for patient to qualify for Medicaid.   She is requesting [facility], hospital) or [employer]. CSW left voicemails to review the referral; both liaisons are off today.   Expected Discharge Plan: Skilled Nursing Facility Barriers to Discharge: Continued Medical Work up, Ship broker, SNF Pending bed offer   Patient Goals and CMS Choice Patient states their goals for this hospitalization and ongoing recovery are:: Rehab CMS Medicare.gov Compare Post Acute Care list provided to:: Patient Represenative (must comment) Choice offered to / list presented to :  (Niece) [place] ownership interest in [facility]provided to::  (Niece)    Expected Discharge Plan and Services In-house Referral: Clinical Social Work   Post Acute Care Choice: [place] Living arrangements for the past 2 months: [place]                                      Prior Living Arrangements/Services Living arrangements for the past 2 months: Single Family Home Lives with:: Self Patient language and need for interpreter reviewed:: Yes Do you  feel safe going back to the place where you live?: Yes      Need for Family Participation in Patient Care: Yes (Comment) Care giver support system in place?: Yes (comment) Current home services: Homehealth aide (Through the [place]) Criminal Activity/Legal Involvement Pertinent to Current Situation/Hospitalization: No - Comment as needed  Activities of Daily Living Home Assistive Devices/Equipment: None ADL Screening (condition at time of admission) Patient's cognitive ability adequate to safely complete daily activities?: Yes Is the patient deaf or have difficulty hearing?: No Does the patient have difficulty seeing, even when wearing glasses/contacts?: No Does the patient have difficulty concentrating, remembering, or making decisions?: No Patient able to express need for assistance with ADLs?: Yes Does the patient have difficulty dressing or bathing?: No Independently performs ADLs?: Yes (appropriate for developmental age) Does the patient have difficulty walking or climbing stairs?: No Weakness of Legs: None Weakness of Arms/Hands: None  Permission Sought/Granted Permission sought to share information with : Facility Sport and exercise psychologist, Family Supports Permission granted to share information with : No  Share Information with NAME: [name]  Permission granted to share info w AGENCY: SNFs  Permission granted to share info w Relationship: Niece  Permission granted to share info w Contact Information: [phone]  Emotional Assessment Appearance:: Appears stated age Attitude/Demeanor/Rapport: Unable to Assess Affect (typically observed): Unable to Assess Orientation: : Oriented to Self, Oriented to Place Alcohol /  Substance Use: Not Applicable Psych Involvement: No (comment)  Admission diagnosis:  Hypertensive crisis [I16.9] Patient Active Problem List   Diagnosis Date Noted   Disorientation 09/19/2022   Hypertensive crisis 09/18/2022   Encounter for general adult medical  examination with abnormal findings 06/20/2022   Onychomycosis 06/20/2022   Physical deconditioning 12/21/2021   Acute cystitis without hematuria 07/10/2021   Benign prostatic hyperplasia with urinary frequency 07/10/2021   Vitamin D deficiency 05/23/2021   Anxiety 01/11/2021   Chronic obstructive lung disease ([place]) 01/11/2021   Contact dermatitis and other eczema 01/11/2021   Diabetes mellitus ([place]) 01/11/2021   Diabetic peripheral neuropathy ([place]) 01/11/2021   Gout 01/11/2021   History of colonic polyps 01/11/2021   Ventral hernia 01/11/2021   Neck pain 11/20/2020   Lymphedema of lower extremity 11/18/2018   Chronic pain syndrome 05/06/2018   Depression, major, single episode, mild ([place]) 06/20/2017   DNR (do not resuscitate) 06/20/2017   Chronic combined systolic and diastolic heart failure ([place]) 10/23/2016   Hypothyroidism 11/30/2014   Sinus pause 07/21/2014   Difficulty walking 05/03/2014   Anemia 07/14/2013   Spinal stenosis of lumbar region 07/06/2013   Carpal tunnel syndrome 04/13/2013   DDD (degenerative disc disease), lumbosacral 04/13/2013   Peripheral neuropathy 01/05/2013   S/P arthroscopy of left knee 11/09/2012   Osteoarthritis of knee 10/07/2012   NASH (nonalcoholic steatohepatitis) 05/22/2007   Hyperlipidemia 10/15/2006   Class 3 obesity ([place]) 10/15/2006   Hypertension 10/15/2006   Gastroesophageal reflux disease 10/15/2006   PCP:  Lindell Spar, MD Pharmacy:   [place], [address] [place] [address] Phone: [phone] Fax: [place], [place] - [place] [address] [place] [address] Phone: [phone] Fax: [phone]     Social Determinants of Health (SDOH) Social History: SDOH Screenings   Food Insecurity: No Food Insecurity (09/19/2022)  Housing: Low Risk  (09/19/2022)  Transportation Needs:  No Transportation Needs (09/19/2022)  Utilities: Not At Risk (09/19/2022)  Alcohol Screen: Low Risk  (04/18/2022)  Depression (PHQ2-9): Low Risk  (06/20/2022)  Financial Resource Strain: Low Risk  (04/18/2022)  Physical Activity: Inactive (04/18/2022)  Social Connections: Moderately Isolated (04/18/2022)  Stress: No Stress Concern Present (04/18/2022)  Tobacco Use: Medium Risk (09/18/2022)   SDOH Interventions:     Readmission Risk Interventions     No data to display

## 2022-09-24 NOTE — Consult Note (Addendum)
Cardiology Consultation   Michael ID: Michael [name] MRN: [id number]; DOB: 03/01/1946  Admit date: 09/18/2022 Date of Consult: 09/24/2022  PCP:  Lindell Spar, [facility] Cardiologist:  [name], MD     Michael Profile:   Michael [name] is a 76 y.o. male with a hx of heart failure with improved EF, COPD, HLD, HTN, Type 2 DM, NASH, chronic lymphedema who is being seen 09/24/2022 for the evaluation of Atrial Fibrillation at the request of Dr. Marthenia Rolling.  History of Present Illness:   Michael [name] is a 76 year old male with above medical history who is followed by Dr. Harl Bowie. Per chart review, Michael was admitted to the hospital in 08/2019 for treatment of worsening dyspnea. Underwent echocardiogram on 09/20/2019 showed EF <20% with regional wall motion abnormalities, (down from 50-55% in 2017). Underwent R/L heart cath on 09/22/2019 that showed normal LVEDP, mild, nonobstructive CAD. Michael was discharged on losartan 25, coreg, lasix. Later transitioned from lasix to bumex, losartan to entresto. Repeat echocardiogram on 10/25/21 showed EF 50-55% with regional wall motion abnormalities, grade II diastolic dysfunction. Michael was last seen by cardiology on 05/22/22. At that time, Michael was doing well from a cardiac standpoint. Remained on bumex, carvedilol, metolazone 2 times weekly, entresto.   Michael presented to the ED on 12/20 with altered mental status. Found to have severely elevated BP to 220/122. While in the ED, Michael had a generalized tonic clonic seizure that was aborted with ativan. 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 Also started on keppra.  Noted to have converted to afib with RVR on 12/21, treated with metoprolol. Later transitioned to carvedilol. Michael had lumbar puncture on 12/22, showed 0 WBC. Antibiotics and steroids were stopped. Started Eliquis after  LP  Late at night on 12/24, Michael was noted to have developed afib with RVR, HR in the 140s-160s. Treated with IV ditiazem. Cardiology was consulted for further management.   On interview, Michael reports that he is feeling well today. He reports that earlier this admission, he really didn't know why he was in the hospital or what was going on. Does not remember having any symptoms. He has been told that he had seizures and had altered mental status, and is able to explain his overall hospital course to me today. Today, Michael reports that he is feeling better and feels like things are "coming together". He denies having chest pain, sob today. Did have some palpitations/fluttering in his chest overnight, but denies having palpitations now. He denies history of afib. Denies alcohol use. Does drink caffeine. No known sleep apnea, but he has never had a sleep study   Past Medical History:  Diagnosis Date   Arthritis    Congestive heart disease ([place]) 11/2015   COPD (chronic obstructive pulmonary disease) (HCC)    DDD (degenerative disc disease), lumbosacral    Demand ischemia 09/23/2019   Depression    Diabetic neuropathy (HCC)    Diastolic dysfunction    Essential hypertension    GERD (gastroesophageal reflux disease)    Hyperlipidemia    Iron deficiency anemia    Left knee DJD    Lumbar spinal stenosis    Lymphedema 11/2015   BOTH LEGS   NASH (nonalcoholic steatohepatitis)    Peripheral edema    Peripheral edema 10/05/2015   Type 2 diabetes mellitus ([place])     Past Surgical History:  Procedure Laterality  Date   BIOPSY N/A 07/21/2014   Procedure: BIOPSY;  Surgeon: Daneil Dolin, MD;  Location: [facility];  Service: Endoscopy;  Laterality: N/A;   CATARACT EXTRACTION W/PHACO Right 07/08/2016   Procedure: CATARACT EXTRACTION PHACO AND INTRAOCULAR LENS PLACEMENT RIGHT EYE;  Surgeon: Tonny Branch, MD;  Location: [facility];  Service: Ophthalmology;  Laterality: Right;  CDE: 9.01   CATARACT  EXTRACTION W/PHACO Left 07/22/2016   Procedure: CATARACT EXTRACTION PHACO AND INTRAOCULAR LENS PLACEMENT LEFT EYE CDE=10.69;  Surgeon: Tonny Branch, MD;  Location: [facility];  Service: Ophthalmology;  Laterality: Left;  left -    COLONOSCOPY  2011   [facility]: normal colon, normal distal ileum   COLONOSCOPY WITH PROPOFOL N/A 07/21/2014   Procedure: ATTEMPTED COLONOSCOPY WITH PROPOFOL-HAD TO STOP DUE TO BRADYCARDIA;  Surgeon: Daneil Dolin, MD;  Location: [facility];  Service: Endoscopy;  Laterality: N/A;   EGD with enteroscopy  2011   [facility]: normal esophagus and stomach. Normal duodenum, jejunum. No evidence of AVMs.    ESOPHAGOGASTRODUODENOSCOPY (EGD) WITH PROPOFOL N/A 07/21/2014   Procedure: ESOPHAGOGASTRODUODENOSCOPY (EGD) WITH PROPOFOL;  Surgeon: Daneil Dolin, MD;  Location: [facility];  Service: Endoscopy;  Laterality: N/A;   HERNIA REPAIR     KNEE ARTHROSCOPY WITH MEDIAL MENISECTOMY Left 11/06/2012   Procedure: KNEE ARTHROSCOPY WITH MEDIAL MENISECTOMY;  Surgeon: Carole Civil, MD;  Location: [facility];  Service: Orthopedics;  Laterality: Left;   Lipoma removal     Stomach   LUMBAR LAMINECTOMY/DECOMPRESSION MICRODISCECTOMY Left 08/30/2013   Procedure: LUMBAR LAMINECTOMY/DECOMPRESSION MICRODISCECTOMY LEFT  LUMBAR TWO THREE;  Surgeon: Otilio Connors, MD;  Location: [facility];  Service: Neurosurgery;  Laterality: Left;   RIGHT/LEFT HEART CATH AND CORONARY ANGIOGRAPHY N/A 09/22/2019   Procedure: RIGHT/LEFT HEART CATH AND CORONARY ANGIOGRAPHY;  Surgeon: [place], Peter M, MD;  Location: [facility];  Service: Cardiovascular;  Laterality: N/A;   SHOULDER SURGERY     Rght-rotator cuff   TOOTH EXTRACTION       Home Medications:  Prior to Admission medications   Medication Sig Start Date End Date Taking? Authorizing Provider  aspirin EC 81 MG tablet Take 81 mg by mouth daily.   Yes [provider]  Baclofen 5 MG TABS Take 2.5 mg by mouth 3 (three) times daily as needed.   Yes  [provider]  bumetanide (BUMEX) 2 MG tablet Take 1.5 tablets (3 mg total) by mouth 2 (two) times daily. 11/01/19  Yes Branch, Alphonse Guild, MD  carvedilol (COREG) 12.5 MG tablet Take 1 tablet (12.5 mg total) by mouth 2 (two) times daily. 12/14/19  Yes BranchAlphonse Guild, MD  Cholecalciferol (VITAMIN D3) 10 MCG (400 UNIT) tablet Take 400 Units by mouth daily.   Yes [provider]  clotrimazole-betamethasone (LOTRISONE) cream Apply 1 Application topically daily. 06/20/22  Yes Lindell Spar, MD  diclofenac sodium (VOLTAREN) 1 % GEL Apply 4 g topically 4 (four) times daily as needed (pain). Michael taking differently: Apply 1 Application topically 4 (four) times daily as needed (pain). 03/06/17  Yes [place], Modena Nunnery, MD  Ferrous Sulfate (IRON) 325 (65 FE) MG TABS Take 325 mg by mouth daily.   Yes [provider]  gabapentin (NEURONTIN) 400 MG capsule Take 1 capsule (400 mg total) by mouth 3 (three) times daily. 03/04/18  Yes , Modena Nunnery, MD  HYDROcodone-acetaminophen [facility]) 10-325 MG tablet Take 1 tablet by mouth 3 (three) times daily as needed for severe pain. Michael taking differently:  Take by mouth. 09/10/22  Yes Lindell Spar, MD  Insulin Glargine (LANTUS SOLOSTAR) 100 UNIT/ML Solostar Pen Inject 5 Units into the skin daily at 10 pm. Michael taking differently: Inject 5 Units into the skin at bedtime. 03/06/18  Yes [place], Modena Nunnery, MD  levothyroxine (SYNTHROID) 100 MCG tablet Take 100 mcg by mouth daily before breakfast.   Yes [provider]  loperamide (IMODIUM A-D) 2 MG tablet Take 2 mg by mouth daily as needed for diarrhea or loose stools.   Yes [provider]  magnesium oxide (MAG-OX) 400 MG tablet Take 1 tablet (400 mg total) by mouth 2 (two) times daily. 11/19/17  Yes Imogene Burn, PA-C  metFORMIN (GLUCOPHAGE) 1000 MG tablet TAKE ONE TABLET BY MOUTH TWICE DAILY WITH A MEAL. Michael taking differently: Take 500 mg by mouth 2 (two) times  daily with a meal. 09/24/19  Yes Sande Rives E, PA-C  metolazone (ZAROXOLYN) 2.5 MG tablet Two Times Weekly on Monday and Thursday Michael taking differently: Take 2.5 mg by mouth 2 (two) times a week. Monday and Thursday 02/08/20  Yes Branch, Alphonse Guild, MD  nystatin cream (MYCOSTATIN) APPLY TO AFFECTED AREA TWICE DAILY. Michael taking differently: Apply 1 Application topically 2 (two) times daily as needed (redness). 03/04/18  Yes [place], Modena Nunnery, MD  oxyCODONE-acetaminophen (PERCOCET) 7.5-325 MG tablet Take 1 tablet by mouth every 6 (six) hours as needed for severe pain.   Yes [provider]  pantoprazole (PROTONIX) 40 MG tablet Take 1 tablet (40 mg total) by mouth daily. 02/08/20  Yes Branch, Alphonse Guild, MD  potassium chloride (KLOR-CON) 10 MEQ tablet Take 10 mEq by mouth 2 (two) times daily.   Yes [provider]  pravastatin (PRAVACHOL) 40 MG tablet TAKE ONE TABLET BY MOUTH DAILY. Michael taking differently: Take 40 mg by mouth daily. 05/21/16  Yes [place], Modena Nunnery, MD  sacubitril-valsartan (ENTRESTO) 49-51 MG Take 1 tablet by mouth 2 (two) times daily. 02/08/20  Yes Branch, Alphonse Guild, MD  tamsulosin (FLOMAX) 0.4 MG CAPS capsule TAKE (1) CAPSULE BY MOUTH EVERY DAY. Michael taking differently: Take 0.4 mg by mouth daily. 10/10/21  Yes Lindell Spar, MD  terbinafine (LAMISIL) 250 MG tablet TAKE ONE TABLET BY MOUTH ONCE DAILY. 08/20/22  Yes Lindell Spar, MD  traZODone (DESYREL) 100 MG tablet TAKE 1 TABLET BY MOUTH AT BEDTIME AS NEEDED FOR SLEEP. Michael taking differently: Take 100 mg by mouth at bedtime as needed for sleep. 11/18/18  Yes [place], Modena Nunnery, MD  triamcinolone cream (KENALOG) 0.1 % Apply 1 Application topically 2 (two) times daily.   Yes [provider]  allopurinol (ZYLOPRIM) 100 MG tablet Take 100 mg by mouth daily. Michael not taking: Reported on 09/19/2022    [provider]  insulin glargine-yfgn (SEMGLEE) 100 UNIT/ML Pen INJECT 20  UNITS SUBCUTANEOUSLY AT BEDTIME FOR DIABETES PNP HANDS AND FEET (CONVERTED FROM LANTUS) Michael not taking: Reported on 09/19/2022 06/18/22   [provider]  meloxicam (MOBIC) 7.5 MG tablet TAKE ONE TABLET BY MOUTH ONCE DAILY. Michael not taking: Reported on 09/19/2022 08/12/22   Carole Civil, MD    Inpatient Medications: Scheduled Meds:  apixaban  5 mg Oral BID   aspirin  81 mg Oral Daily   carvedilol  12.5 mg Oral BID WC   gabapentin  400 mg Oral TID   insulin aspart  0-15 Units Subcutaneous TID WC   insulin aspart  0-5 Units Subcutaneous QHS   insulin detemir  10 Units Subcutaneous QHS   levETIRAcetam  1,000 mg Oral BID   levothyroxine  88 mcg Oral Q0600   multivitamin with minerals  1 tablet Oral Daily   pantoprazole (PROTONIX) IV  40 mg Intravenous Q24H   potassium & sodium phosphates  1 packet Oral TID WC & HS   pravastatin  40 mg Oral Daily   sacubitril-valsartan  1 tablet Oral BID   terbinafine  250 mg Oral Daily   vitamin B-12  1,500 mcg Oral Daily   Continuous Infusions:  sodium chloride 10 mL/hr at 09/20/22 2200   diltiazem (CARDIZEM) infusion 15 mg/hr (09/24/22 0822)   levETIRAcetam     PRN Meds: sodium chloride, acetaminophen **OR** acetaminophen, Gerhardt's butt cream, LORazepam, morphine injection, mouth rinse  Allergies:   No Known Allergies  Social History:   Social History   Socioeconomic History   Marital status: Widowed    Spouse name: Not on file   Number of children: Not on file   Years of education: 12   Highest education level: Not on file  Occupational History   Not on file  Tobacco Use   Smoking status: Former    Packs/day: 2.50    Years: 44.00    Total pack years: 110.00    Types: Cigarettes    Start date: 05/07/1955    Quit date: 10/01/1999    Years since quitting: 22.9   Smokeless tobacco: Never  Vaping Use   Vaping Use: Never used  Substance and Sexual Activity   Alcohol use: No    Alcohol/week: 0.0 standard drinks  of alcohol   Drug use: No   Sexual activity: Yes    Birth control/protection: None  Other Topics Concern   Not on file  Social History Narrative   Not on file   Social Determinants of Health   Financial Resource Strain: Low Risk  (04/18/2022)   Overall Financial Resource Strain (CARDIA)    Difficulty of Paying Living Expenses: Not hard at all  Food Insecurity: No Food Insecurity (09/19/2022)   Hunger Vital Sign    Worried About Running Out of Food in the Last Year: Never true    [place] in the Last Year: Never true  Transportation Needs: No Transportation Needs (09/19/2022)   PRAPARE - Hydrologist (Medical): No    Lack of Transportation (Non-Medical): No  Physical Activity: Inactive (04/18/2022)   Exercise Vital Sign    Days of Exercise per Week: 0 days    Minutes of Exercise per Session: 0 min  Stress: No Stress Concern Present (04/18/2022)   [place]    Feeling of Stress : Not at all  Social Connections: Moderately Isolated (04/18/2022)   Social Connection and Isolation Panel [NHANES]    Frequency of Communication with Friends and Family: More than three times a week    Frequency of Social Gatherings with Friends and Family: More than three times a week    Attends Religious Services: 1 to 4 times per year    Active Member of [employer] or Organizations: No    Attends Archivist Meetings: Never    Marital Status: Widowed  Intimate Partner Violence: Not At Risk (09/19/2022)   Humiliation, Afraid, Rape, and Kick questionnaire    Fear of Current or Ex-Partner: No    Emotionally Abused: No    Physically Abused: No    Sexually Abused: No    Family History:  Family History  Problem Relation Age of Onset   Heart disease Mother    Hyperlipidemia Mother    Hypertension Mother    Depression Mother    Diabetes Mother    Rectal cancer Mother    Cancer Mother    Heart  disease Father    Hypertension Father    Hyperlipidemia Father    Diabetes Father    Cancer Father    Heart disease Sister    Hyperlipidemia Sister    Hypertension Sister    Diabetes Sister    Diabetes Brother    Heart disease Sister    Hyperlipidemia Sister    Hypertension Sister    Heart disease Sister    Hyperlipidemia Sister    Hypertension Sister    Diabetes Sister    Diabetes Brother      ROS:  Please see the history of present illness.   All other ROS reviewed and negative.     Physical Exam/Data:   Vitals:   09/24/22 0551 09/24/22 0640 09/24/22 0757 09/24/22 1220  BP: (!) 161/80 (!) 168/88 (!) 195/85 129/63  Pulse: 82  87 61  Resp: 18 14  15   Temp: 97.7 F (36.5 C)   97.7 F (36.5 C)  TempSrc: Oral   Oral  SpO2: 95%   95%  Weight:      Height:        Intake/Output Summary (Last 24 hours) at 09/24/2022 1357 Last data filed at 09/24/2022 1055 Gross per 24 hour  Intake --  Output 3890 ml  Net -3890 ml      09/21/2022   12:00 AM 09/20/2022    5:00 AM 09/19/2022    6:00 AM  Last 3 Weights  Weight (lbs) 205 lb 11 oz 192 lb 7.4 oz 188 lb 15 oz  Weight (kg) 93.3 kg 87.3 kg 85.7 kg     Body mass index is 41.54 kg/m.  General:  Well nourished, well developed, in no acute distress. Sitting up comfortably in the chair with legs extended  HEENT: normal Neck: no JVD Vascular: Radial pulses 2+ bilaterally Cardiac:  normal S1, S2; RRR; no murmur  Lungs:  Crackles in right lower lung, otherwise clear to auscultation. Normal WOB on room air   Abd: soft, nontender, no hepatomegaly  Ext: trace edema in BLE, chronic skin thickening on ble  Musculoskeletal:  No deformities, BUE and BLE strength normal and equal Skin: warm and dry  Neuro:  CNs 2-12 intact, no focal abnormalities noted Psych:  Normal affect   EKG:  The EKG was personally reviewed and demonstrates:  Atrial fibrillation, left axis deviation, HR 100 BPM  Telemetry:  Telemetry was personally  reviewed and demonstrates:  NSR with frequent PVCs and PACs   Relevant CV Studies:  Echocardiogram 09/23/22 1. Left ventricular ejection fraction, by estimation, is 50 to 55%. The  left ventricle has low normal function. Left ventricular endocardial  border not optimally defined to evaluate regional wall motion. Left  ventricular diastolic parameters are  consistent with Grade II diastolic dysfunction (pseudonormalization).  Elevated left ventricular end-diastolic pressure.   2. Right ventricular systolic function is normal. The right ventricular  size is normal.   3. Left atrial size was mildly dilated.   4. The mitral valve is degenerative. Trivial mitral valve regurgitation.  No evidence of mitral stenosis.   5. The aortic valve is tricuspid. Aortic valve regurgitation is not  visualized. Aortic valve sclerosis/calcification is present, without any  evidence of aortic stenosis.  Aortic valve area, by VTI measures 1.23 cm.  Aortic valve mean gradient measures  4.5 mmHg. Aortic valve Vmax measures 1.53 m/s.   Laboratory Data:  High Sensitivity Troponin:  No results for input(s): "TROPONINIHS" in the last 720 hours.   Chemistry Recent Labs  Lab 09/22/22 2121 09/23/22 0143 09/23/22 0546 09/23/22 1511 09/24/22 0221  NA 137   < > 139 136 138  K 2.6*   < > 3.9 3.8 3.3*  CL 99   < > 104 102 103  CO2 24   < > 25 28 25   GLUCOSE 162*   < > 111* 166* 153*  BUN 5*   < > <5* 5* <5*  CREATININE 1.00   < > 0.78 0.85 0.69  CALCIUM 7.9*   < > 8.0* 7.8* 8.0*  MG 2.0  --  1.8  --  1.5*  GFRNONAA >60   < > >60 >60 >60  ANIONGAP 14   < > 10 6 10    < > = values in this interval not displayed.    Recent Labs  Lab 09/18/22 1021 09/22/22 1216 09/22/22 2121 09/23/22 0546 09/24/22 0221  PROT 6.9  --   --   --  4.9*  ALBUMIN 3.7   < > 2.6* 2.7* 2.6*  AST 22  --   --   --  19  ALT 16  --   --   --  20  ALKPHOS 77  --   --   --  58  BILITOT 0.4  --   --   --  0.7   < > = values in this  interval not displayed.   Lipids No results for input(s): "CHOL", "TRIG", "HDL", "LABVLDL", "LDLCALC", "CHOLHDL" in the last 168 hours.  Hematology Recent Labs  Lab 09/18/22 1021 09/19/22 0639 09/24/22 0221  WBC 4.3 7.2 4.5  RBC 5.28 4.90 4.59  HGB 14.1 13.2 12.6*  HCT 44.8 40.3 37.9*  MCV 84.8 82.2 82.6  MCH 26.7 26.9 27.5  MCHC 31.5 32.8 33.2  RDW 15.5 14.9 15.7*  PLT 292 297 171   Thyroid  Recent Labs  Lab 09/19/22 0640  TSH 1.056    BNPNo results for input(s): "BNP", "PROBNP" in the last 168 hours.  DDimer  Recent Labs  Lab 09/23/22 0546  DDIMER 0.71*     Radiology/Studies:  CT Angio Chest Pulmonary Embolism (PE) W or WO Contrast  Result Date: 09/23/2022 CLINICAL DATA:  Short of breath, suspected pulmonary embolus EXAM: CT ANGIOGRAPHY CHEST WITH CONTRAST TECHNIQUE: Multidetector CT imaging of the chest was performed using the standard protocol during bolus administration of intravenous contrast. Multiplanar CT image reconstructions and MIPs were obtained to evaluate the vascular anatomy. RADIATION DOSE REDUCTION: This exam was performed according to the departmental dose-optimization program which includes automated exposure control, adjustment of the mA and/or kV according to Michael size and/or use of iterative reconstruction technique. CONTRAST:  67m OMNIPAQUE IOHEXOL 350 MG/ML SOLN COMPARISON:  09/23/2022 FINDINGS: Cardiovascular: This is a technically adequate evaluation of the pulmonary vasculature. No filling defects or pulmonary emboli. Mild cardiomegaly without pericardial effusion. Atherosclerosis throughout the coronary vasculature greatest in the LAD and circumflex distributions. Normal caliber of the thoracic aorta. Atherosclerosis of the aortic arch. Mediastinum/Nodes: No enlarged mediastinal, hilar, or axillary lymph nodes. Thyroid gland, trachea, and esophagus demonstrate no significant findings. Lungs/Pleura: There are trace bilateral pleural effusions. No  acute airspace disease or pneumothorax. There is mild bilateral bronchial wall thickening greatest at the lung  bases. The central airways are patent. Upper Abdomen: Nodular contour of the liver capsule may reflect underlying cirrhosis. No acute upper abdominal findings. Musculoskeletal: No acute or destructive bony lesions. Reconstructed images demonstrate no additional findings. Review of the MIP images confirms the above findings. IMPRESSION: 1. No evidence of pulmonary embolus. 2. Trace bilateral pleural effusions. 3. Mild bilateral bronchial wall thickening which could reflect reactive airway disease or bronchitis. No acute airspace disease. 4. Cirrhosis. 5. Aortic Atherosclerosis (ICD10-I70.0). Coronary artery atherosclerosis. Electronically Signed   By: Randa Ngo M.D.   On: 09/23/2022 19:19   ECHOCARDIOGRAM COMPLETE  Result Date: 09/23/2022    ECHOCARDIOGRAM REPORT   Michael Name:   ZIV [name] Date of Exam: 09/23/2022 Medical Rec #:  [id number]       Height:       59.0 in Accession #:    [id number]      Weight:       205.7 lb Date of Birth:  03-16-1946        BSA:          1.866 m Michael Age:    3 years        BP:           118/74 mmHg Michael Gender: M               HR:           79 bpm. Exam Location:  Inpatient Procedure: 2D Echo, Cardiac Doppler and Color Doppler Indications:    Atrial Fibrillation I48.91  History:        Michael has prior history of Echocardiogram examinations, most                 recent 10/25/2021. CHF, COPD; Risk Factors:Hypertension,                 Dyslipidemia and Diabetes.  Sonographer:    Darlina Sicilian RDCS Referring Phys: [id number] [place]  1. Left ventricular ejection fraction, by estimation, is 50 to 55%. The left ventricle has low normal function. Left ventricular endocardial border not optimally defined to evaluate regional wall motion. Left ventricular diastolic parameters are consistent with Grade II diastolic dysfunction  (pseudonormalization). Elevated left ventricular end-diastolic pressure.  2. Right ventricular systolic function is normal. The right ventricular size is normal.  3. Left atrial size was mildly dilated.  4. The mitral valve is degenerative. Trivial mitral valve regurgitation. No evidence of mitral stenosis.  5. The aortic valve is tricuspid. Aortic valve regurgitation is not visualized. Aortic valve sclerosis/calcification is present, without any evidence of aortic stenosis. Aortic valve area, by VTI measures 1.23 cm. Aortic valve mean gradient measures 4.5 mmHg. Aortic valve Vmax measures 1.53 m/s. FINDINGS  Left Ventricle: Left ventricular ejection fraction, by estimation, is 50 to 55%. The left ventricle has low normal function. Left ventricular endocardial border not optimally defined to evaluate regional wall motion. The left ventricular internal cavity  size was normal in size. There is no left ventricular hypertrophy. Left ventricular diastolic parameters are consistent with Grade II diastolic dysfunction (pseudonormalization). Elevated left ventricular end-diastolic pressure. Right Ventricle: The right ventricular size is normal. No increase in right ventricular wall thickness. Right ventricular systolic function is normal. Left Atrium: Left atrial size was mildly dilated. Right Atrium: Right atrial size was normal in size. Pericardium: There is no evidence of pericardial effusion. Presence of epicardial fat layer. Mitral Valve: The mitral valve is degenerative in appearance. There is mild calcification of  the mitral valve leaflet(s). Mild mitral annular calcification. Trivial mitral valve regurgitation. No evidence of mitral valve stenosis. Tricuspid Valve: The tricuspid valve is grossly normal. Tricuspid valve regurgitation is not demonstrated. No evidence of tricuspid stenosis. Aortic Valve: The aortic valve is tricuspid. Aortic valve regurgitation is not visualized. Aortic valve sclerosis/calcification  is present, without any evidence of aortic stenosis. Aortic valve mean gradient measures 4.5 mmHg. Aortic valve peak gradient measures 9.4 mmHg. Aortic valve area, by VTI measures 1.23 cm. Pulmonic Valve: The pulmonic valve was grossly normal. Pulmonic valve regurgitation is not visualized. No evidence of pulmonic stenosis. Aorta: The aortic root and ascending aorta are structurally normal, with no evidence of dilitation. IAS/Shunts: No atrial level shunt detected by color flow Doppler.  LEFT VENTRICLE PLAX 2D LVIDd:         4.60 cm   Diastology LVIDs:         3.30 cm   LV e' medial:    6.00 cm/s LV PW:         1.10 cm   LV E/e' medial:  15.8 LV IVS:        1.00 cm   LV e' lateral:   8.52 cm/s LVOT diam:     1.90 cm   LV E/e' lateral: 11.1 LV SV:         33 LV SV Index:   18 LVOT Area:     2.84 cm  RIGHT VENTRICLE RV S prime:     14.90 cm/s TAPSE (M-mode): 1.8 cm LEFT ATRIUM             Index        RIGHT ATRIUM           Index LA diam:        3.60 cm 1.93 cm/m   RA Area:     13.30 cm LA Vol (A2C):   53.0 ml 28.40 ml/m  RA Volume:   27.20 ml  14.57 ml/m LA Vol (A4C):   61.3 ml 32.84 ml/m LA Biplane Vol: 60.4 ml 32.36 ml/m  AORTIC VALVE AV Area (Vmax):    1.34 cm AV Area (Vmean):   1.19 cm AV Area (VTI):     1.23 cm AV Vmax:           153.00 cm/s AV Vmean:          100.050 cm/s AV VTI:            0.272 m AV Peak Grad:      9.4 mmHg AV Mean Grad:      4.5 mmHg LVOT Vmax:         72.20 cm/s LVOT Vmean:        42.100 cm/s LVOT VTI:          0.118 m LVOT/AV VTI ratio: 0.43  AORTA Ao Root diam: 3.30 cm Ao Asc diam:  3.50 cm MITRAL VALVE MV Area (PHT): 3.36 cm    SHUNTS MV Decel Time: 226 msec    Systemic VTI:  0.12 m MV E velocity: 94.60 cm/s  Systemic Diam: 1.90 cm MV A velocity: 74.55 cm/s MV E/A ratio:  1.27 Skeet Latch MD Electronically signed by Skeet Latch MD Signature Date/Time: 09/23/2022/6:05:10 PM    Final    DG CHEST PORT 1 VIEW  Result Date: 09/21/2022 CLINICAL DATA:  Short of  breath. EXAM: PORTABLE CHEST 1 VIEW COMPARISON:  09/18/2022. FINDINGS: Cardiac silhouette is normal in size. Normal mediastinal and hilar contours. Lungs are clear.  No convincing pleural effusion.  No pneumothorax. Nasal/orogastric tube passes below the diaphragm. Skeletal structures are grossly intact. IMPRESSION: No active disease. Electronically Signed   By: Lajean Manes M.D.   On: 09/21/2022 11:09     Assessment and Plan:   Paroxysmal Atrial Fibrillation, new onset  - Michael has been in and out of afib this admission. No known history  - Per telemetry, Michael is now in NSR with frequent PVCs and PACs  - K 3.3, mag 1.5 today. Michael needs supplementation to maintain K>4, mag >2.  - Stop IV diltiazem  - Continue eliquis 5 mg BID  - Increase carvedilol to 25 mg BID  - Can consider outpatient sleep study   Chronic Heart Failure with Improved EF  - Michael had EF <20% in 2020.  - Echocardiogram this admission with EF 50-55% - Michael euvolemic on exam (continues to have trace ankle edema, but Michael has chronic lymphadenopathy)  - Increase carvedilol to 25 mg BID, continue entresto 49-51 mg BID - Resume home bumex 3 mg BID   Otherwise per primary  - Acute encephalopathy, seizure activity  - Hypothyroidism  - Type 2 Dm  - Chronic lymphedema  - Chronic pain   Risk Assessment/Risk Scores:   CHA2DS2-VASc Score = 6  This indicates a 9.7% annual risk of stroke. The Michael's score is based upon: CHF History: 1 HTN History: 1 Diabetes History: 1 Stroke History: 0 Vascular Disease History: 1 Age Score: 2 Gender Score: 0   For questions or updates, please contact [place] Please consult [email or web address] for contact info under    Signed, Margie Billet, PA-C  09/24/2022 1:57 PM  Michael seen and examined with KJ PA.  Agree as above, with the following exceptions and changes as noted below. Pt of Dr. Harl Bowie admitted with worsening dyspnea, noted to have  altered mental status and new onset afib. Gen: NAD, CV: RRR, no murmurs, Lungs: clear, Abd: soft, Extrem: Warm, well perfused, no edema, Neuro/Psych: alert and oriented x 3, normal mood and affect. All available labs, radiology testing, previous records reviewed. Needs aggressive electrolyte repletion, not yet completed today. K 40 meQ Q8 for 3 doses and mag 1.5 needs 2 g IV mag sulfate repletion. Had a 14 beat run of NSVT - both afib and NSVT will benefit from electrolytes being well treated.  Continues in Afib despite brief SR. Continue on IV dilt for tonight, however could consider transition to oral tomorrow.   Elouise Munroe, MD 09/24/22 3:45 PM

## 2022-09-24 NOTE — Progress Notes (Signed)
PROGRESS NOTE    Michael [name]  KWI:[id number] DOB: [birth year] DOA: 09/18/2022 PCP: Lindell Spar, MD  Outpatient Specialists:     Brief Narrative:  As per H&P done on admission: "Michael [name] is a 76 y.o. M with PMH significant for HFpEF, COPD, HL, DM, NASH, chronic lymphedema who presented to [facility] with AMS.  He had a fall four days prior to admission and was evaluated in the ED with negative head CT.  Home health went to check on patient and found him confused and called EMS.  He was initially hypertensive with BP 220/92.  While in the ED pt had a tonic-clonic seizure which ceased with Ativan.  Head CT again negative.  An MRI brain was obtained showing equivocal/subtle DWI hyperintensity within the left hippocampus could be consistent with encephalitis, no evidence of PRES.   Neurology was consulted, findings could be seen in the setting of HSV encephalitis or post-ictally in the absence  of infection.   An LP was attempted, but not successful given history of surgery at L4-L5.   Neurology recommended transfer to [facility] for IR to perform LP and for continuous EEG.    His labs were significant for Mag 1.2, no leukocytosis and normal renal function.  He was loaded with Keppra and given Acyclovir and transferred to [facility]"  09/21/2022: CSF fluid analysis revealed 0 WBC.  Antibiotics and acyclovir have been discontinued.  Patient seen alongside patient's nurse.  Patient remains confused.  No significant history from patient.  Patient has pulled NG tube severally.  For bedside swallowing evaluation by the nurse.  09/22/2022: Patient seen.  Patient remains significantly confused.  Patient developed A-fib RVR later in the day.  Patient was started Cardizem drip and Eliquis.  Low threshold to consult the cardiology team.  Likely, patient may have paroxysmal atrial fibrillation.  09/23/2022: Heart rate is controlled on Cardizem drip.  Mildly elevated D-dimer.  No recent echocardiogram.  Patient seen  alongside patient's niece.  Patient has no children.  Patient's niece is next of kin.  There are concerns about disposition.  Will consult TOC team.  Pursue echocardiogram and CTA chest.  Low threshold to consult the cardiology team.  Confusion is improving.  09/24/2022: Patient seen.  No new complaints today.  Patient continues to improve.  Cardiology input is appreciated.  IV Cardizem drip to be continued.  Lab work done today revealed sodium of 136, potassium of 3.3, BUN of less than 5, serum creatinine 0.69 and magnesium of 1.5.  Will continue to monitor and replete electrolytes.   Assessment & Plan:   Principal Problem:   Hypertensive crisis Active Problems:   Disorientation  Acute Encephalopathy 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  -Transfer to [facility], neurology consulted for likely LTM EEG -seizure precautions, prn Ativan -IR consult in the AM for LP -continue Acyclovir -loaded with Keppra, prn ativan -seizure precautions -no history of ETOH use -check b12 level 09/21/2022: See above documentation.  CSF analysis is nonrevealing.  Antibiotics and acyclovir have been discontinued.  Neurology input is appreciated.  LTM EEG did not reveal any definite seizure.  Patient is still on antiseizure medications.  Patient remains confused. 09/22/2022: Patient remains significantly confused. 09/24/2022: Slowly improving.  A-fib with RVR: -Start Cardizem drip. -Start Eliquis. -Low threshold to reconsult cardiology team. -Low threshold to repeat echocardiogram. -Normal TSH. -D-dimer was minimally elevated. -CTA chest is negative for  pulmonary embolism.  Echocardiogram revealed:   1. Left ventricular ejection fraction, by estimation, is 50 to 55%. The  left ventricle has low normal function. Left ventricular endocardial  border not optimally defined to evaluate  regional wall motion. Left  ventricular diastolic parameters are  consistent with Grade II diastolic dysfunction (pseudonormalization).  Elevated left ventricular end-diastolic pressure.   2. Right ventricular systolic function is normal. The right ventricular  size is normal.   3. Left atrial size was mildly dilated.   4. The mitral valve is degenerative. Trivial mitral valve regurgitation.  No evidence of mitral stenosis.   5. The aortic valve is tricuspid. Aortic valve regurgitation is not  visualized. Aortic valve sclerosis/calcification is present, without any  evidence of aortic stenosis. Aortic valve area, by VTI measures 1.23 cm.  Aortic valve mean gradient measures  4.5 mmHg. Aortic valve Vmax measures 1.53 m/s.    Hypertensive Emergency HFpEF HL     Hypomagnesemia Mag of 1.5. -Replete.  Hypophosphatemia: -Replete.  Hypothyroidism -continue synthroid and check TSH 09/21/2022: TSH was 1.056.  Hypoalbuminemia: -This may have prognostic significance. -Cannot rule out moderate malnutrition/PEM.     Type 2 DM -SSI     Chronic lymphedema Chronic pain   DVT prophylaxis: Eliquis Code Status: Full code Family Communication:  Disposition Plan: This will depend on hospital course   Consultants:  Neurology  Procedures:  LTM EEG  Antimicrobials:  None   Subjective: No new complaints. Confusion is improving.     Objective: Vitals:   09/24/22 0640 09/24/22 0757 09/24/22 1220 09/24/22 1610  BP: (!) 168/88 (!) 195/85 129/63 (!) 136/95  Pulse:  87 61 94  Resp: 14  15 20   Temp:   97.7 F (36.5 C) 98 F (36.7 C)  TempSrc:   Oral Oral  SpO2:   95% 95%  Weight:      Height:        Intake/Output Summary (Last 24 hours) at 09/24/2022 1926 Last data filed at 09/24/2022 1730 Gross per 24 hour  Intake --  Output 4340 ml  Net -4340 ml    Filed Weights   09/19/22 0600 09/20/22 0500 09/21/22 0000  Weight: 85.7 kg 87.3 kg 93.3 kg     Examination:  General exam: Appears calm and comfortable.  Remains confused Respiratory system: Clear to auscultation.   Cardiovascular system: S1 & S2 heard  Gastrointestinal system: Abdomen is obese, soft and non tender. . Central nervous system: Confusion is improving.  Data Reviewed: I have personally reviewed following labs and imaging studies  CBC: Recent Labs  Lab 09/18/22 1021 09/19/22 0639 09/24/22 0221  WBC 4.3 7.2 4.5  NEUTROABS  --   --  3.1  HGB 14.1 13.2 12.6*  HCT 44.8 40.3 37.9*  MCV 84.8 82.2 82.6  PLT 292 297 794    Basic Metabolic Panel: Recent Labs  Lab 09/21/22 1148 09/22/22 0612 09/22/22 1216 09/22/22 2121 09/23/22 0143 09/23/22 0546 09/23/22 1511 09/24/22 0221  NA 139  --  137 137 138 139 136 138  K 3.6  --  3.2* 2.6* 3.2* 3.9 3.8 3.3*  CL 105  --  101 99 101 104 102 103  CO2 26  --  24 24 24 25 28 25   GLUCOSE 74  --  86 162* 138* 111* 166* 153*  BUN 9  --  6* 5* 5* <5* 5* <5*  CREATININE 0.94  --  0.75 1.00 0.86 0.78 0.85 0.69  CALCIUM 7.9*  --  8.0*  7.9* 7.8* 8.0* 7.8* 8.0*  MG 1.7 1.3*  --  2.0  --  1.8  --  1.5*  PHOS 2.0*  --  1.7* 1.7*  --  1.4* 3.5  --     GFR: Estimated Creatinine Clearance: 74.4 mL/min (by C-G formula based on SCr of 0.69 mg/dL). Liver Function Tests: Recent Labs  Lab 09/18/22 1021 09/22/22 1216 09/22/22 2121 09/23/22 0546 09/24/22 0221  AST 22  --   --   --  19  ALT 16  --   --   --  20  ALKPHOS 77  --   --   --  58  BILITOT 0.4  --   --   --  0.7  PROT 6.9  --   --   --  4.9*  ALBUMIN 3.7 2.7* 2.6* 2.7* 2.6*    No results for input(s): "LIPASE", "AMYLASE" in the last 168 hours. Recent Labs  Lab 09/18/22 1040  AMMONIA 32    Coagulation Profile: No results for input(s): "INR", "PROTIME" in the last 168 hours. Cardiac Enzymes: No results for input(s): "CKTOTAL", "CKMB", "CKMBINDEX", "TROPONINI" in the last 168 hours. BNP (last 3 results) No results for input(s): "PROBNP" in the last 8760  hours. HbA1C: No results for input(s): "HGBA1C" in the last 72 hours. CBG: Recent Labs  Lab 09/23/22 1601 09/23/22 2117 09/24/22 0839 09/24/22 1218 09/24/22 1608  GLUCAP 124* 179* 146* 170* 221*    Lipid Profile: No results for input(s): "CHOL", "HDL", "LDLCALC", "TRIG", "CHOLHDL", "LDLDIRECT" in the last 72 hours. Thyroid Function Tests: No results for input(s): "TSH", "T4TOTAL", "FREET4", "T3FREE", "THYROIDAB" in the last 72 hours.  Anemia Panel: No results for input(s): "VITAMINB12", "FOLATE", "FERRITIN", "TIBC", "IRON", "RETICCTPCT" in the last 72 hours.  Urine analysis:    Component Value Date/Time   COLORURINE STRAW (A) 09/18/2022 1100   APPEARANCEUR CLEAR 09/18/2022 1100   APPEARANCEUR Clear 12/21/2021 1054   LABSPEC 1.010 09/18/2022 1100   PHURINE 6.0 09/18/2022 1100   GLUCOSEU >=500 (A) 09/18/2022 1100   HGBUR SMALL (A) 09/18/2022 1100   BILIRUBINUR NEGATIVE 09/18/2022 1100   BILIRUBINUR negative 04/25/2022 0857   BILIRUBINUR Negative 12/21/2021 1054   KETONESUR NEGATIVE 09/18/2022 1100   PROTEINUR >=300 (A) 09/18/2022 1100   UROBILINOGEN 0.2 04/25/2022 0857   UROBILINOGEN 0.2 08/24/2013 1129   NITRITE NEGATIVE 09/18/2022 1100   LEUKOCYTESUR NEGATIVE 09/18/2022 1100   Sepsis Labs: @LABRCNTIP (procalcitonin:4,lacticidven:4)  ) Recent Results (from the past 240 hour(s))  Resp panel by RT-PCR (RSV, Flu A&B, Covid) Anterior Nasal Swab     Status: None   Collection Time: 09/18/22 11:00 AM   Specimen: Anterior Nasal Swab  Result Value Ref Range Status   SARS Coronavirus 2 by RT PCR NEGATIVE NEGATIVE Final    Comment: (NOTE) SARS-CoV-2 target nucleic acids are NOT DETECTED.  The SARS-CoV-2 RNA is generally detectable in upper respiratory specimens during the acute phase of infection. The lowest concentration of SARS-CoV-2 viral copies this assay can detect is 138 copies/mL. A negative result does not preclude SARS-Cov-2 infection and should not be used as  the sole basis for treatment or other patient management decisions. A negative result may occur with  improper specimen collection/handling, submission of specimen other than nasopharyngeal swab, presence of viral mutation(s) within the areas targeted by this assay, and inadequate number of viral copies(<138 copies/mL). A negative result must be combined with clinical observations, patient history, and epidemiological information. The expected result is Negative.  Fact Sheet for Patients:  EntrepreneurPulse.com.au  Fact Sheet for Healthcare Providers:  IncredibleEmployment.be  This test is no t yet approved or cleared by the [place] FDA and  has been authorized for detection and/or diagnosis of SARS-CoV-2 by FDA under an Emergency Use Authorization (EUA). This EUA will remain  in effect (meaning this test can be used) for the duration of the COVID-19 declaration under Section 564(b)(1) of the Act, 21 U.S.C.section 360bbb-3(b)(1), unless the authorization is terminated  or revoked sooner.       Influenza A by PCR NEGATIVE NEGATIVE Final   Influenza B by PCR NEGATIVE NEGATIVE Final    Comment: (NOTE) The Xpert Xpress SARS-CoV-2/FLU/RSV plus assay is intended as an aid in the diagnosis of influenza from Nasopharyngeal swab specimens and should not be used as a sole basis for treatment. Nasal washings and aspirates are unacceptable for Xpert Xpress SARS-CoV-2/FLU/RSV testing.  Fact Sheet for Patients: EntrepreneurPulse.com.au  Fact Sheet for Healthcare Providers: IncredibleEmployment.be  This test is not yet approved or cleared by the [place] FDA and has been authorized for detection and/or diagnosis of SARS-CoV-2 by FDA under an Emergency Use Authorization (EUA). This EUA will remain in effect (meaning this test can be used) for the duration of the COVID-19 declaration under Section 564(b)(1) of  the Act, 21 U.S.C. section 360bbb-3(b)(1), unless the authorization is terminated or revoked.     Resp Syncytial Virus by PCR NEGATIVE NEGATIVE Final    Comment: (NOTE) Fact Sheet for Patients: EntrepreneurPulse.com.au  Fact Sheet for Healthcare Providers: IncredibleEmployment.be  This test is not yet approved or cleared by the [place] FDA and has been authorized for detection and/or diagnosis of SARS-CoV-2 by FDA under an Emergency Use Authorization (EUA). This EUA will remain in effect (meaning this test can be used) for the duration of the COVID-19 declaration under Section 564(b)(1) of the Act, 21 U.S.C. section 360bbb-3(b)(1), unless the authorization is terminated or revoked.  Performed at [facility], [address] [place], [place] [address]   MRSA Next Gen by PCR, Nasal     Status: None   Collection Time: 09/19/22  2:45 AM   Specimen: Nasal Mucosa; Nasal Swab  Result Value Ref Range Status   MRSA by PCR Next Gen NOT DETECTED NOT DETECTED Final    Comment: (NOTE) The GeneXpert MRSA Assay (FDA approved for NASAL specimens only), is one component of a comprehensive MRSA colonization surveillance program. It is not intended to diagnose MRSA infection nor to guide or monitor treatment for MRSA infections. Test performance is not FDA approved in patients less than 58 years old. Performed at [facility], [place] [address] [place], [place] [address]   Culture, blood (Routine X 2) w Reflex to ID Panel     Status: None   Collection Time: 09/19/22 12:15 PM   Specimen: BLOOD RIGHT HAND  Result Value Ref Range Status   Specimen Description BLOOD RIGHT HAND  Final   Special Requests IN PEDIATRIC BOTTLE Blood Culture adequate volume  Final   Culture   Final    NO GROWTH 5 DAYS Performed at [facility], [place] [address] [place],  [address]    Report Status 09/24/2022 FINAL  Final  Culture, blood (Routine X 2) w  Reflex to ID Panel     Status: None   Collection Time: 09/19/22  2:37 PM   Specimen: BLOOD RIGHT HAND  Result Value Ref Range Status   Specimen Description BLOOD RIGHT HAND  Final   Special  Requests   Final    BOTTLES DRAWN AEROBIC AND ANAEROBIC Blood Culture adequate volume   Culture   Final    NO GROWTH 5 DAYS Performed at [facility], [place] [address] [place], [place] [address]    Report Status 09/24/2022 FINAL  Final  CSF culture w Gram Stain     Status: None   Collection Time: 09/20/22  1:28 PM   Specimen: PATH Cytology CSF; Cerebrospinal Fluid  Result Value Ref Range Status   Specimen Description CSF  Final   Special Requests NONE  Final   Gram Stain   Final    WBC PRESENT, PREDOMINANTLY MONONUCLEAR NO ORGANISMS SEEN    Culture   Final    NO GROWTH 3 DAYS Performed at [facility], [place] [address], [place], [place] [address]    Report Status 09/23/2022 FINAL  Final         Radiology Studies: CT Angio Chest Pulmonary Embolism (PE) W or WO Contrast  Result Date: 09/23/2022 CLINICAL DATA:  Short of breath, suspected pulmonary embolus EXAM: CT ANGIOGRAPHY CHEST WITH CONTRAST TECHNIQUE: Multidetector CT imaging of the chest was performed using the standard protocol during bolus administration of intravenous contrast. Multiplanar CT image reconstructions and MIPs were obtained to evaluate the vascular anatomy. RADIATION DOSE REDUCTION: This exam was performed according to the departmental dose-optimization program which includes automated exposure control, adjustment of the mA and/or kV according to patient size and/or use of iterative reconstruction technique. CONTRAST:  72m OMNIPAQUE IOHEXOL 350 MG/ML SOLN COMPARISON:  09/23/2022 FINDINGS: Cardiovascular: This is a technically adequate evaluation of the pulmonary vasculature. No filling defects or pulmonary emboli. Mild cardiomegaly without pericardial effusion. Atherosclerosis throughout the coronary vasculature  greatest in the LAD and circumflex distributions. Normal caliber of the thoracic aorta. Atherosclerosis of the aortic arch. Mediastinum/Nodes: No enlarged mediastinal, hilar, or axillary lymph nodes. Thyroid gland, trachea, and esophagus demonstrate no significant findings. Lungs/Pleura: There are trace bilateral pleural effusions. No acute airspace disease or pneumothorax. There is mild bilateral bronchial wall thickening greatest at the lung bases. The central airways are patent. Upper Abdomen: Nodular contour of the liver capsule may reflect underlying cirrhosis. No acute upper abdominal findings. Musculoskeletal: No acute or destructive bony lesions. Reconstructed images demonstrate no additional findings. Review of the MIP images confirms the above findings. IMPRESSION: 1. No evidence of pulmonary embolus. 2. Trace bilateral pleural effusions. 3. Mild bilateral bronchial wall thickening which could reflect reactive airway disease or bronchitis. No acute airspace disease. 4. Cirrhosis. 5. Aortic Atherosclerosis (ICD10-I70.0). Coronary artery atherosclerosis. Electronically Signed   By: MRanda NgoM.D.   On: 09/23/2022 19:19   ECHOCARDIOGRAM COMPLETE  Result Date: 09/23/2022    ECHOCARDIOGRAM REPORT   Patient Name:   JJAMONTE CURFMANDate of Exam: 09/23/2022 Medical Rec #:  [id number]      Height:       59.0 in Accession #:    [id number]     Weight:       205.7 lb Date of Birth:  [birth year]       BSA:          1.866 m Patient Age:    89years        BP:           118/74 mmHg Patient Gender: M               HR:  79 bpm. Exam Location:  Inpatient Procedure: 2D Echo, Cardiac Doppler and Color Doppler Indications:    Atrial Fibrillation I48.91  History:        Patient has prior history of Echocardiogram examinations, most                 recent 10/25/2021. CHF, COPD; Risk Factors:Hypertension,                 Dyslipidemia and Diabetes.  Sonographer:    Darlina Sicilian RDCS Referring Phys: [id number]  [place]  1. Left ventricular ejection fraction, by estimation, is 50 to 55%. The left ventricle has low normal function. Left ventricular endocardial border not optimally defined to evaluate regional wall motion. Left ventricular diastolic parameters are consistent with Grade II diastolic dysfunction (pseudonormalization). Elevated left ventricular end-diastolic pressure.  2. Right ventricular systolic function is normal. The right ventricular size is normal.  3. Left atrial size was mildly dilated.  4. The mitral valve is degenerative. Trivial mitral valve regurgitation. No evidence of mitral stenosis.  5. The aortic valve is tricuspid. Aortic valve regurgitation is not visualized. Aortic valve sclerosis/calcification is present, without any evidence of aortic stenosis. Aortic valve area, by VTI measures 1.23 cm. Aortic valve mean gradient measures 4.5 mmHg. Aortic valve Vmax measures 1.53 m/s. FINDINGS  Left Ventricle: Left ventricular ejection fraction, by estimation, is 50 to 55%. The left ventricle has low normal function. Left ventricular endocardial border not optimally defined to evaluate regional wall motion. The left ventricular internal cavity  size was normal in size. There is no left ventricular hypertrophy. Left ventricular diastolic parameters are consistent with Grade II diastolic dysfunction (pseudonormalization). Elevated left ventricular end-diastolic pressure. Right Ventricle: The right ventricular size is normal. No increase in right ventricular wall thickness. Right ventricular systolic function is normal. Left Atrium: Left atrial size was mildly dilated. Right Atrium: Right atrial size was normal in size. Pericardium: There is no evidence of pericardial effusion. Presence of epicardial fat layer. Mitral Valve: The mitral valve is degenerative in appearance. There is mild calcification of the mitral valve leaflet(s). Mild mitral annular calcification. Trivial mitral valve  regurgitation. No evidence of mitral valve stenosis. Tricuspid Valve: The tricuspid valve is grossly normal. Tricuspid valve regurgitation is not demonstrated. No evidence of tricuspid stenosis. Aortic Valve: The aortic valve is tricuspid. Aortic valve regurgitation is not visualized. Aortic valve sclerosis/calcification is present, without any evidence of aortic stenosis. Aortic valve mean gradient measures 4.5 mmHg. Aortic valve peak gradient measures 9.4 mmHg. Aortic valve area, by VTI measures 1.23 cm. Pulmonic Valve: The pulmonic valve was grossly normal. Pulmonic valve regurgitation is not visualized. No evidence of pulmonic stenosis. Aorta: The aortic root and ascending aorta are structurally normal, with no evidence of dilitation. IAS/Shunts: No atrial level shunt detected by color flow Doppler.  LEFT VENTRICLE PLAX 2D LVIDd:         4.60 cm   Diastology LVIDs:         3.30 cm   LV e' medial:    6.00 cm/s LV PW:         1.10 cm   LV E/e' medial:  15.8 LV IVS:        1.00 cm   LV e' lateral:   8.52 cm/s LVOT diam:     1.90 cm   LV E/e' lateral: 11.1 LV SV:         33 LV SV Index:   18 LVOT Area:  2.84 cm  RIGHT VENTRICLE RV S prime:     14.90 cm/s TAPSE (M-mode): 1.8 cm LEFT ATRIUM             Index        RIGHT ATRIUM           Index LA diam:        3.60 cm 1.93 cm/m   RA Area:     13.30 cm LA Vol (A2C):   53.0 ml 28.40 ml/m  RA Volume:   27.20 ml  14.57 ml/m LA Vol (A4C):   61.3 ml 32.84 ml/m LA Biplane Vol: 60.4 ml 32.36 ml/m  AORTIC VALVE AV Area (Vmax):    1.34 cm AV Area (Vmean):   1.19 cm AV Area (VTI):     1.23 cm AV Vmax:           153.00 cm/s AV Vmean:          100.050 cm/s AV VTI:            0.272 m AV Peak Grad:      9.4 mmHg AV Mean Grad:      4.5 mmHg LVOT Vmax:         72.20 cm/s LVOT Vmean:        42.100 cm/s LVOT VTI:          0.118 m LVOT/AV VTI ratio: 0.43  AORTA Ao Root diam: 3.30 cm Ao Asc diam:  3.50 cm MITRAL VALVE MV Area (PHT): 3.36 cm    SHUNTS MV Decel Time: 226 msec     Systemic VTI:  0.12 m MV E velocity: 94.60 cm/s  Systemic Diam: 1.90 cm MV A velocity: 74.55 cm/s MV E/A ratio:  1.27 Skeet Latch MD Electronically signed by Skeet Latch MD Signature Date/Time: 09/23/2022/6:05:10 PM    Final         Scheduled Meds:  apixaban  5 mg Oral BID   aspirin  81 mg Oral Daily   bumetanide  3 mg Oral BID   carvedilol  25 mg Oral BID WC   gabapentin  400 mg Oral TID   insulin aspart  0-15 Units Subcutaneous TID WC   insulin aspart  0-5 Units Subcutaneous QHS   insulin detemir  10 Units Subcutaneous QHS   levETIRAcetam  1,000 mg Oral BID   levothyroxine  88 mcg Oral Q0600   multivitamin with minerals  1 tablet Oral Daily   pantoprazole (PROTONIX) IV  40 mg Intravenous Q24H   potassium & sodium phosphates  1 packet Oral TID WC & HS   potassium chloride  40 mEq Oral TID   pravastatin  40 mg Oral Daily   sacubitril-valsartan  1 tablet Oral BID   terbinafine  250 mg Oral Daily   vitamin B-12  1,500 mcg Oral Daily   Continuous Infusions:  sodium chloride 10 mL/hr at 09/20/22 2200   levETIRAcetam       LOS: 6 days    Time spent: 55 minutes.    Dana Allan, MD  [facility] Pager #: [phone] 7PM-7AM contact night coverage as above

## 2022-09-24 NOTE — NC FL2 (Signed)
[place] LEVEL OF CARE FORM     IDENTIFICATION  Patient Name: Michael [name] Birthdate: 02/18/1946 Sex: male Admission Date (Current Location): 09/18/2022  [facility] and [place] Number:  [employer] and Address:  [facility] [place]. [facility], [place] [address] [place], [place] [address]      Provider Number: [id number]  Attending Physician Name and Address:  Bonnell Public, MD  Relative Name and Phone Number:       Current Level of Care: Hospital Recommended Level of Care: [place] Prior Approval Number:    Date Approved/Denied:   PASRR Number: [id number]  Discharge Plan: SNF    Current Diagnoses: Patient Active Problem List   Diagnosis Date Noted   Disorientation 09/19/2022   Hypertensive crisis 09/18/2022   Encounter for general adult medical examination with abnormal findings 06/20/2022   Onychomycosis 06/20/2022   Physical deconditioning 12/21/2021   Acute cystitis without hematuria 07/10/2021   Benign prostatic hyperplasia with urinary frequency 07/10/2021   Vitamin D deficiency 05/23/2021   Anxiety 01/11/2021   Chronic obstructive lung disease ([place]) 01/11/2021   Contact dermatitis and other eczema 01/11/2021   Diabetes mellitus ([place]) 01/11/2021   Diabetic peripheral neuropathy ([place]) 01/11/2021   Gout 01/11/2021   History of colonic polyps 01/11/2021   Ventral hernia 01/11/2021   Neck pain 11/20/2020   Lymphedema of lower extremity 11/18/2018   Chronic pain syndrome 05/06/2018   Depression, major, single episode, mild ([place]) 06/20/2017   DNR (do not resuscitate) 06/20/2017   Chronic combined systolic and diastolic heart failure ([place]) 10/23/2016   Hypothyroidism 11/30/2014   Sinus pause 07/21/2014   Difficulty walking 05/03/2014   Anemia 07/14/2013   Spinal stenosis of lumbar region 07/06/2013   Carpal tunnel syndrome 04/13/2013   DDD (degenerative disc disease), lumbosacral 04/13/2013   Peripheral  neuropathy 01/05/2013   S/P arthroscopy of left knee 11/09/2012   Osteoarthritis of knee 10/07/2012   NASH (nonalcoholic steatohepatitis) 05/22/2007   Hyperlipidemia 10/15/2006   Class 3 obesity ([place]) 10/15/2006   Hypertension 10/15/2006   Gastroesophageal reflux disease 10/15/2006    Orientation RESPIRATION BLADDER Height & Weight     Self, Situation, Place  Normal Incontinent, External catheter Weight: 205 lb 11 oz (93.3 kg) Height:  4' 11"  (149.9 cm)  BEHAVIORAL SYMPTOMS/MOOD NEUROLOGICAL BOWEL NUTRITION STATUS      Continent Diet (See dc summary)  AMBULATORY STATUS COMMUNICATION OF NEEDS Skin   Limited Assist Verbally PU Stage and Appropriate Care (pressure injury on left and right pretibials)                       Personal Care Assistance Level of Assistance  Bathing, Feeding, Dressing Bathing Assistance: Limited assistance Feeding assistance: Limited assistance Dressing Assistance: Limited assistance     Functional Limitations Info             [place]  PT (By licensed PT), OT (By licensed OT)     PT Frequency: 5x/week OT Frequency: 5x/week            Contractures Contractures Info: Not present    Additional Factors Info  Code Status, Allergies, Insulin Sliding Scale Code Status Info: Full Allergies Info: NKA   Insulin Sliding Scale Info: See dc summary       Current Medications (09/24/2022):  This is the current hospital active medication list Current Facility-Administered Medications  Medication Dose Route Frequency Provider Last Rate Last Admin  0.9 %  sodium chloride infusion   Intravenous PRN Julian Hy, DO 10 mL/hr at 09/20/22 2200 Restarted at 09/20/22 2200   acetaminophen (TYLENOL) tablet 650 mg  650 mg Oral Q6H PRN Dana Allan I, MD   650 mg at 09/24/22 7124   Or   acetaminophen (TYLENOL) suppository 650 mg  650 mg Rectal Q6H PRN Bonnell Public, MD       apixaban Arne Cleveland) tablet 5 mg  5 mg Oral  BID Dana Allan I, MD   5 mg at 09/24/22 5809   aspirin chewable tablet 81 mg  81 mg Oral Daily Dana Allan I, MD   81 mg at 09/24/22 0835   bumetanide (BUMEX) tablet 3 mg  3 mg Oral BID Margie Billet, PA-C       carvedilol (COREG) tablet 25 mg  25 mg Oral BID WC Vikki Ports R, PA-C       gabapentin (NEURONTIN) capsule 400 mg  400 mg Oral TID Dana Allan I, MD   400 mg at 09/24/22 9833   Gerhardt's butt cream   Topical Daily PRN Shela Leff, MD       insulin aspart (novoLOG) injection 0-15 Units  0-15 Units Subcutaneous TID WC Zierle-Ghosh, Asia B, DO   3 Units at 09/24/22 1236   insulin aspart (novoLOG) injection 0-5 Units  0-5 Units Subcutaneous QHS Zierle-Ghosh, Asia B, DO       insulin detemir (LEVEMIR) injection 10 Units  10 Units Subcutaneous QHS Zierle-Ghosh, Asia B, DO   10 Units at 09/23/22 2122   levETIRAcetam (KEPPRA) IVPB 1000 mg/100 mL premix  1,000 mg Intravenous BID Donnetta Simpers, MD       Or   levETIRAcetam (KEPPRA) tablet 1,000 mg  1,000 mg Oral BID Donnetta Simpers, MD   1,000 mg at 09/24/22 8250   levothyroxine (SYNTHROID) tablet 88 mcg  88 mcg Oral Q0600 Dana Allan I, MD   88 mcg at 09/24/22 5397   LORazepam (ATIVAN) injection 2 mg  2 mg Intravenous Q5 min PRN Zierle-Ghosh, Asia B, DO       morphine (PF) 2 MG/ML injection 2 mg  2 mg Intravenous Q2H PRN Zierle-Ghosh, Asia B, DO   2 mg at 09/24/22 6734   multivitamin with minerals tablet 1 tablet  1 tablet Oral Daily Donnetta Simpers, MD   1 tablet at 09/24/22 1937   Oral care mouth rinse  15 mL Mouth Rinse PRN Noemi Chapel P, DO       pantoprazole (PROTONIX) injection 40 mg  40 mg Intravenous Q24H Sloan Leiter B, RPH   40 mg at 09/23/22 1956   potassium & sodium phosphates (PHOS-NAK) 280-160-250 MG packet 1 packet  1 packet Oral TID WC & HS Dana Allan I, MD   1 packet at 09/24/22 1236   pravastatin (PRAVACHOL) tablet 40 mg  40 mg Oral Daily Dana Allan I, MD    40 mg at 09/24/22 9024   sacubitril-valsartan (ENTRESTO) 49-51 mg per tablet  1 tablet Oral BID Dana Allan I, MD   1 tablet at 09/24/22 0973   terbinafine (LAMISIL) tablet 250 mg  250 mg Oral Daily Zierle-Ghosh, Asia B, DO   250 mg at 09/24/22 5329   vitamin B-12 (CYANOCOBALAMIN) tablet 1,500 mcg  1,500 mcg Oral Daily Dellia Beckwith, NP   1,500 mcg at 09/24/22 9242     Discharge Medications: Please see discharge summary for a list of discharge medications.  Relevant Imaging Results:  Relevant Lab  Results:   Additional Information SSN: [id number]  [place], [place]

## 2022-09-24 NOTE — Progress Notes (Signed)
Physical Therapy Evaluation Patient Details Name: Michael [name] MRN: [id number] DOB: Sep 26, 1946 Today's Date: 09/24/2022  History of Present Illness  Pt is a 76 y/o M presenting to [facility] on 12/10 for evaluation on AMS, recent fall x4 days ago and seen in ED/discharged after head CT negative for acute findings. Seizure in ED on 12/20. MRI brain revealing hyperintensity in L hippocampus consistent with encephalitis. Transfer to [facility] for LTM EEG. PMH includes COPD, HTN, DM2, CHF, NASH, BLE edema, and chronic lymphedema.  Clinical Impression  PT evaluated pt today with OT in attendance, mainly as pt is a non ambulatory baseline functionally limited individual who will need to be assisted extensively to go home.  Given his readmission history, recommending him to do SNF rehab to get LE strength back, to increase safety and independence of lateral scoot and short standing transfers, and to get his home situation set for managing him to  handle his day to day self care tasks for quality and independence.  Follow acutely for goals of PT as are outlined below.       Recommendations for follow up therapy are one component of a multi-disciplinary discharge planning process, led by the attending physician.  Recommendations may be updated based on patient status, additional functional criteria and insurance authorization.  Follow Up Recommendations Skilled nursing-short term rehab (<3 hours/day) Can patient physically be transported by private vehicle: No    Assistance Recommended at Discharge Frequent or constant Supervision/Assistance  Patient can return home with the following  A little help with walking and/or transfers;A little help with bathing/dressing/bathroom;Assistance with cooking/housework;Assist for transportation;Direct supervision/assist for financial management;Direct supervision/assist for medications management    Equipment Recommendations None recommended by PT  Recommendations for Other  Services       Functional Status Assessment Patient has had a recent decline in their functional status and demonstrates the ability to make significant improvements in function in a reasonable and predictable amount of time.     Precautions / Restrictions Precautions Precautions: Fall Restrictions Weight Bearing Restrictions: No      Mobility  Bed Mobility Overal bed mobility: Needs Assistance Bed Mobility: Supine to Sit     Supine to sit: Min guard          Transfers Overall transfer level: Needs assistance Equipment used: None Transfers: Bed to chair/wheelchair/BSC   Stand pivot transfers: Min assist        Lateral/Scoot Transfers: Min guard General transfer comment: did scoot to chair and did one stand from the chair to reposition    Ambulation/Gait               General Gait Details: has not walked in years  Stairs            Wheelchair Mobility    Modified Rankin (Stroke Patients Only)       Balance Overall balance assessment: Needs assistance Sitting-balance support: Feet supported Sitting balance-Leahy Scale: Fair                                       Pertinent Vitals/Pain Pain Assessment Pain Assessment: No/denies pain    Home Living Family/patient expects to be discharged to:: Private residence Living Arrangements: Alone Available Help at Discharge: Family;Friend(s);Available PRN/intermittently Type of Home: Apartment Home Access: Level entry       Home Layout: One level Home Equipment: Wheelchair - manual;BSC/3in1;Shower seat;Adaptive equipment  Prior Function Prior Level of Function : Independent/Modified Independent             Mobility Comments: wheelchair mobility for several years ADLs Comments: has assist for IADLs from neice     Hand Dominance   Dominant Hand: Left    Extremity/Trunk Assessment   Upper Extremity Assessment Upper Extremity Assessment: Defer to OT evaluation     Lower Extremity Assessment Lower Extremity Assessment: Generalized weakness    Cervical / Trunk Assessment Cervical / Trunk Assessment: Normal  Communication   Communication: No difficulties  Cognition Arousal/Alertness: Awake/alert Behavior During Therapy: WFL for tasks assessed/performed Overall Cognitive Status: Impaired/Different from baseline Area of Impairment: Orientation, Attention, Memory, Following commands, Awareness, Safety/judgement, Problem solving                 Orientation Level: Disoriented to, Situation, Place Current Attention Level: Selective Memory: Decreased short-term memory Following Commands: Follows one step commands with increased time Safety/Judgement: Decreased awareness of safety Awareness: Emergent, Intellectual Problem Solving: Slow processing, Requires tactile cues, Requires verbal cues General Comments: difficulty retaining information about admission despite being retold        General Comments General comments (skin integrity, edema, etc.): monitored HR and sats with mobility and noted controlled values    Exercises     Assessment/Plan    PT Assessment Patient needs continued PT services  PT Problem List Decreased strength;Decreased balance;Decreased activity tolerance;Decreased mobility;Decreased skin integrity;Impaired sensation       PT Treatment Interventions DME instruction;Functional mobility training;Therapeutic activities;Therapeutic exercise;Balance training;Neuromuscular re-education;Patient/family education    PT Goals (Current goals can be found in the Care Plan section)  Acute Rehab PT Goals Patient Stated Goal: to return home to live independently PT Goal Formulation: With patient Time For Goal Achievement: 10/07/22 Potential to Achieve Goals: Good    Frequency Min 3X/week     Co-evaluation PT/OT/SLP Co-Evaluation/Treatment: Yes Reason for Co-Treatment: For patient/therapist safety;To address  functional/ADL transfers PT goals addressed during session: Mobility/safety with mobility;Strengthening/ROM OT goals addressed during session: ADL's and self-care;Strengthening/ROM       AM-PAC PT "6 Clicks" Mobility  Outcome Measure Help needed turning from your back to your side while in a flat bed without using bedrails?: A Little Help needed moving from lying on your back to sitting on the side of a flat bed without using bedrails?: A Little Help needed moving to and from a bed to a chair (including a wheelchair)?: A Little Help needed standing up from a chair using your arms (e.g., wheelchair or bedside chair)?: Total Help needed to walk in hospital room?: Total Help needed climbing 3-5 steps with a railing? : Total 6 Click Score: 12    End of Session   Activity Tolerance: Patient tolerated treatment well Patient left: in chair;with call bell/phone within reach;Other (comment) (legs elevated)   PT Visit Diagnosis: Muscle weakness (generalized) (M62.81);Other abnormalities of gait and mobility (R26.89)    Time: 1255-1319 PT Time Calculation (min) (ACUTE ONLY): 24 min   Charges:   PT Evaluation $PT Eval Moderate Complexity: 1 Mod         Ramond Dial 09/24/2022, 4:06 PM  Mee Hives, PT PhD Acute Rehab Dept. Number: [place] [facility] [place]

## 2022-09-25 ENCOUNTER — Ambulatory Visit: Payer: Medicare Other | Admitting: Internal Medicine

## 2022-09-25 DIAGNOSIS — I169 Hypertensive crisis, unspecified: Secondary | ICD-10-CM | POA: Diagnosis not present

## 2022-09-25 LAB — CBC
HCT: 39.3 % (ref 39.0–52.0)
Hemoglobin: 13.2 g/dL (ref 13.0–17.0)
MCH: 27.7 pg (ref 26.0–34.0)
MCHC: 33.6 g/dL (ref 30.0–36.0)
MCV: 82.6 fL (ref 80.0–100.0)
Platelets: 175 10*3/uL (ref 150–400)
RBC: 4.76 MIL/uL (ref 4.22–5.81)
RDW: 15.9 % — ABNORMAL HIGH (ref 11.5–15.5)
WBC: 4.1 10*3/uL (ref 4.0–10.5)
nRBC: 0 % (ref 0.0–0.2)

## 2022-09-25 LAB — GLUCOSE, CAPILLARY
Glucose-Capillary: 167 mg/dL — ABNORMAL HIGH (ref 70–99)
Glucose-Capillary: 163 mg/dL — ABNORMAL HIGH (ref 70–99)
Glucose-Capillary: 204 mg/dL — ABNORMAL HIGH (ref 70–99)

## 2022-09-25 LAB — BASIC METABOLIC PANEL
Anion gap: 7 (ref 5–15)
BUN: 5 mg/dL — ABNORMAL LOW (ref 8–23)
CO2: 28 mmol/L (ref 22–32)
Calcium: 8.3 mg/dL — ABNORMAL LOW (ref 8.9–10.3)
Chloride: 104 mmol/L (ref 98–111)
Creatinine, Ser: 0.94 mg/dL (ref 0.61–1.24)
GFR, Estimated: >60 mL/min (ref >60)
Glucose, Bld: 184 mg/dL — ABNORMAL HIGH (ref 70–99)
Potassium: 4.6 mmol/L (ref 3.5–5.1)
Sodium: 139 mmol/L (ref 135–145)

## 2022-09-25 LAB — MAGNESIUM: Magnesium: 1.7 mg/dL (ref 1.7–2.4)

## 2022-09-25 MED ORDER — MAGNESIUM SULFATE 2 GM/50ML IV SOLN
2.0000 g | Freq: Once | INTRAVENOUS | Status: AC
  Administered 2022-09-25: 2 g via INTRAVENOUS
  Filled 2022-09-25: qty 50

## 2022-09-25 MED ORDER — METOPROLOL TARTRATE 5 MG/5ML IV SOLN
5.0000 mg | Freq: Once | INTRAVENOUS | Status: AC
  Administered 2022-09-25: 5 mg via INTRAVENOUS
  Filled 2022-09-25: qty 5

## 2022-09-25 NOTE — Progress Notes (Signed)
PROGRESS NOTE    Michael [name]  TFT:[id number] DOB: April 06, 1946 DOA: 09/18/2022 PCP: Lindell Spar, MD  Outpatient Specialists:     Brief Narrative:  As per H&P done on admission: "Michael [name] is a 76 y.o. M with PMH significant for HFpEF, COPD, HL, DM, NASH, chronic lymphedema who presented to [facility] with AMS.  He had a fall four days prior to admission and was evaluated in the ED with negative head CT.  Home health went to check on patient and found him confused and called EMS.  He was initially hypertensive with BP 220/92.  While in the ED pt had a tonic-clonic seizure which ceased with Ativan.  Head CT again negative.  An MRI brain was obtained showing equivocal/subtle DWI hyperintensity within the left hippocampus could be consistent with encephalitis, no evidence of PRES.   Neurology was consulted, findings could be seen in the setting of HSV encephalitis or post-ictally in the absence  of infection.   An LP was attempted, but not successful given history of surgery at L4-L5.   Neurology recommended transfer to [facility] for IR to perform LP and for continuous EEG.    His labs were significant for Mag 1.2, no leukocytosis and normal renal function.  He was loaded with Keppra and given Acyclovir and transferred to [facility]"  09/21/2022: CSF fluid analysis revealed 0 WBC.  Antibiotics and acyclovir have been discontinued.  Patient seen alongside patient's nurse.  Patient remains confused.  No significant history from patient.  Patient has pulled NG tube severally.  For bedside swallowing evaluation by the nurse.  09/22/2022: Patient seen.  Patient remains significantly confused.  Patient developed A-fib RVR later in the day.  Patient was started Cardizem drip and Eliquis.  Low threshold to consult the cardiology team.  Likely, patient may have paroxysmal atrial fibrillation.  09/23/2022: Heart rate is controlled on Cardizem drip.  Mildly elevated D-dimer.  No recent echocardiogram.  Patient seen  alongside patient's niece.  Patient has no children.  Patient's niece is next of kin.  There are concerns about disposition.  Will consult TOC team.  Pursue echocardiogram and CTA chest.  Low threshold to consult the cardiology team.  Confusion is improving.  09/24/2022: Patient seen.  No new complaints today.  Patient continues to improve.  Cardiology input is appreciated.  IV Cardizem drip to be continued.  Lab work done today revealed sodium of 136, potassium of 3.3, BUN of less than 5, serum creatinine 0.69 and magnesium of 1.5.  Will continue to monitor and replete electrolytes.  09/25/2022: Patient seen.  Heart rate is controlled.  Potassium is 4.6 today.  Magnesium of 1.7.  Cardiology input is appreciated.  Cardizem changed to oral.  Hopefully, patient will be discharged in the next 24 to 48 hours.   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  CSF analysis is nonrevealing.  Antibiotics and acyclovir have been discontinued.  Neurology input is appreciated.  LTM EEG did not reveal any definite seizure.  Patient is still on antiseizure medications.  Patient  remains confused. 09/22/2022: Patient remains significantly confused. 09/24/2022: Slowly improving. 09/25/2022: Encephalopathy has resolved significantly.  A-fib with RVR: -Start Cardizem drip. -Start Eliquis. -Low threshold to reconsult cardiology team. -Low threshold to repeat echocardiogram. -Normal TSH. -D-dimer was  minimally elevated. -CTA chest is negative for pulmonary embolism. 09/25/2022: Heart rate is controlled.  Cardizem is being transitioned to oral.  Cardiology is directing.  Echocardiogram revealed:   1. Left ventricular ejection fraction, by estimation, is 50 to 55%. The  left ventricle has low normal function. Left ventricular endocardial  border not optimally defined to evaluate regional wall motion. Left  ventricular diastolic parameters are  consistent with Grade II diastolic dysfunction (pseudonormalization).  Elevated left ventricular end-diastolic pressure.   2. Right ventricular systolic function is normal. The right ventricular  size is normal.   3. Left atrial size was mildly dilated.   4. The mitral valve is degenerative. Trivial mitral valve regurgitation.  No evidence of mitral stenosis.   5. The aortic valve is tricuspid. Aortic valve regurgitation is not  visualized. Aortic valve sclerosis/calcification is present, without any  evidence of aortic stenosis. Aortic valve area, by VTI measures 1.23 cm.  Aortic valve mean gradient measures  4.5 mmHg. Aortic valve Vmax measures 1.53 m/s.    Hypertensive Emergency HFpEF HL     Hypomagnesemia Mag of 1.5. -Replete.  Hypophosphatemia: -Replete.  Hypothyroidism -continue synthroid and check TSH 09/21/2022: TSH was 1.056.  Hypoalbuminemia: -This may have prognostic significance. -Cannot rule out moderate malnutrition/PEM.     Type 2 DM -SSI     Chronic lymphedema Chronic pain   DVT prophylaxis: Eliquis Code Status: Full code Family Communication:  Disposition Plan: This will depend on hospital course   Consultants:  Neurology  Procedures:  LTM EEG  Antimicrobials:  None   Subjective: No new complaints.   Objective: Vitals:   09/25/22 0800 09/25/22 0816 09/25/22 1154 09/25/22 1625  BP: (!) 161/88  101/72 117/70  Pulse: (!) 109 91 93 75  Resp: 12  16 16   Temp: 98.7 F (37.1 C)  98.7 F  (37.1 C) 98 F (36.7 C)  TempSrc: Oral  Oral Oral  SpO2: 92%  94% 95%  Weight:      Height:        Intake/Output Summary (Last 24 hours) at 09/25/2022 1921 Last data filed at 09/25/2022 1751 Gross per 24 hour  Intake 50 ml  Output --  Net 50 ml    Filed Weights   09/19/22 0600 09/20/22 0500 09/21/22 0000  Weight: 85.7 kg 87.3 kg 93.3 kg    Examination:  General exam: Appears calm and comfortable.  Remains confused Respiratory system: Clear to auscultation.   Cardiovascular system: S1 & S2 heard  Gastrointestinal system: Abdomen is obese, soft and non tender. . Central nervous system: Confusion is improving.  Data Reviewed: I have personally reviewed following labs and imaging studies  CBC: Recent Labs  Lab 09/19/22 0639 09/24/22 0221 09/25/22 1102  WBC 7.2 4.5 4.1  NEUTROABS  --  3.1  --   HGB 13.2 12.6* 13.2  HCT 40.3 37.9* 39.3  MCV 82.2 82.6 82.6  PLT 297 171 798    Basic Metabolic Panel: Recent Labs  Lab 09/21/22 1148 09/22/22 0612 09/22/22 1216 09/22/22 2121 09/23/22 0143 09/23/22 0546 09/23/22 1511 09/24/22 0221 09/25/22 1102  NA 139  --  137 137 138 139 136 138 139  K 3.6  --  3.2* 2.6* 3.2* 3.9 3.8 3.3* 4.6  CL 105  --  101 99 101 104 102 103 104  CO2 26  --  24 24 24 25 28 25 28   GLUCOSE 74  --  86 162* 138* 111* 166* 153* 184*  BUN 9  --  6* 5* 5* <5* 5* <5* 5*  CREATININE 0.94  --  0.75 1.00 0.86 0.78 0.85 0.69 0.94  CALCIUM 7.9*  --  8.0* 7.9* 7.8* 8.0* 7.8* 8.0* 8.3*  MG 1.7 1.3*  --  2.0  --  1.8  --  1.5* 1.7  PHOS 2.0*  --  1.7* 1.7*  --  1.4* 3.5  --   --     GFR: Estimated Creatinine Clearance: 63.3 mL/min (by C-G formula based on SCr of 0.94 mg/dL). Liver Function Tests: Recent Labs  Lab 09/22/22 1216 09/22/22 2121 09/23/22 0546 09/24/22 0221  AST  --   --   --  19  ALT  --   --   --  20  ALKPHOS  --   --   --  58  BILITOT  --   --   --  0.7  PROT  --   --   --  4.9*  ALBUMIN 2.7* 2.6* 2.7* 2.6*    No results  for input(s): "LIPASE", "AMYLASE" in the last 168 hours. No results for input(s): "AMMONIA" in the last 168 hours.  Coagulation Profile: No results for input(s): "INR", "PROTIME" in the last 168 hours. Cardiac Enzymes: No results for input(s): "CKTOTAL", "CKMB", "CKMBINDEX", "TROPONINI" in the last 168 hours. BNP (last 3 results) No results for input(s): "PROBNP" in the last 8760 hours. HbA1C: No results for input(s): "HGBA1C" in the last 72 hours. CBG: Recent Labs  Lab 09/24/22 1608 09/24/22 2136 09/25/22 0803 09/25/22 1152 09/25/22 1618  GLUCAP 221* 217* 167* 163* 204*    Lipid Profile: No results for input(s): "CHOL", "HDL", "LDLCALC", "TRIG", "CHOLHDL", "LDLDIRECT" in the last 72 hours. Thyroid Function Tests: No results for input(s): "TSH", "T4TOTAL", "FREET4", "T3FREE", "THYROIDAB" in the last 72 hours.  Anemia Panel: No results for input(s): "VITAMINB12", "FOLATE", "FERRITIN", "TIBC", "IRON", "RETICCTPCT" in the last 72 hours.  Urine analysis:    Component Value Date/Time   COLORURINE STRAW (A) 09/18/2022 1100   APPEARANCEUR CLEAR 09/18/2022 1100   APPEARANCEUR Clear 12/21/2021 1054   LABSPEC 1.010 09/18/2022 1100   PHURINE 6.0 09/18/2022 1100   GLUCOSEU >=500 (A) 09/18/2022 1100   HGBUR SMALL (A) 09/18/2022 1100   BILIRUBINUR NEGATIVE 09/18/2022 1100   BILIRUBINUR negative 04/25/2022 0857   BILIRUBINUR Negative 12/21/2021 1054   KETONESUR NEGATIVE 09/18/2022 1100   PROTEINUR >=300 (A) 09/18/2022 1100   UROBILINOGEN 0.2 04/25/2022 0857   UROBILINOGEN 0.2 08/24/2013 1129   NITRITE NEGATIVE 09/18/2022 1100   LEUKOCYTESUR NEGATIVE 09/18/2022 1100   Sepsis Labs: @LABRCNTIP (procalcitonin:4,lacticidven:4)  ) Recent Results (from the past 240 hour(s))  Resp panel by RT-PCR (RSV, Flu A&B, Covid) Anterior Nasal Swab     Status: None   Collection Time: 09/18/22 11:00 AM   Specimen: Anterior Nasal Swab  Result Value Ref Range Status   SARS Coronavirus 2 by RT  PCR NEGATIVE NEGATIVE Final    Comment: (NOTE) SARS-CoV-2 target nucleic acids are NOT DETECTED.  The SARS-CoV-2 RNA is generally detectable in upper respiratory specimens during the acute phase of infection. The lowest concentration of SARS-CoV-2 viral copies this assay can detect is 138 copies/mL. A negative result does not preclude SARS-Cov-2 infection and should not be used as the sole basis for treatment or other patient  management decisions. A negative result may occur with  improper specimen collection/handling, submission of specimen other than nasopharyngeal swab, presence of viral mutation(s) within the areas targeted by this assay, and inadequate number of viral copies(<138 copies/mL). A negative result must be combined with clinical observations, patient history, and epidemiological information. The expected result is Negative.  Fact Sheet for Patients:  EntrepreneurPulse.com.au  Fact Sheet for Healthcare Providers:  IncredibleEmployment.be  This test is no t yet approved or cleared by the [place] FDA and  has been authorized for detection and/or diagnosis of SARS-CoV-2 by FDA under an Emergency Use Authorization (EUA). This EUA will remain  in effect (meaning this test can be used) for the duration of the COVID-19 declaration under Section 564(b)(1) of the Act, 21 U.S.C.section 360bbb-3(b)(1), unless the authorization is terminated  or revoked sooner.       Influenza A by PCR NEGATIVE NEGATIVE Final   Influenza B by PCR NEGATIVE NEGATIVE Final    Comment: (NOTE) The Xpert Xpress SARS-CoV-2/FLU/RSV plus assay is intended as an aid in the diagnosis of influenza from Nasopharyngeal swab specimens and should not be used as a sole basis for treatment. Nasal washings and aspirates are unacceptable for Xpert Xpress SARS-CoV-2/FLU/RSV testing.  Fact Sheet for Patients: EntrepreneurPulse.com.au  Fact Sheet for  Healthcare Providers: IncredibleEmployment.be  This test is not yet approved or cleared by the [place] FDA and has been authorized for detection and/or diagnosis of SARS-CoV-2 by FDA under an Emergency Use Authorization (EUA). This EUA will remain in effect (meaning this test can be used) for the duration of the COVID-19 declaration under Section 564(b)(1) of the Act, 21 U.S.C. section 360bbb-3(b)(1), unless the authorization is terminated or revoked.     Resp Syncytial Virus by PCR NEGATIVE NEGATIVE Final    Comment: (NOTE) Fact Sheet for Patients: EntrepreneurPulse.com.au  Fact Sheet for Healthcare Providers: IncredibleEmployment.be  This test is not yet approved or cleared by the [place] FDA and has been authorized for detection and/or diagnosis of SARS-CoV-2 by FDA under an Emergency Use Authorization (EUA). This EUA will remain in effect (meaning this test can be used) for the duration of the COVID-19 declaration under Section 564(b)(1) of the Act, 21 U.S.C. section 360bbb-3(b)(1), unless the authorization is terminated or revoked.  Performed at [facility], [address], [place], [place] [address]   MRSA Next Gen by PCR, Nasal     Status: None   Collection Time: 09/19/22  2:45 AM   Specimen: Nasal Mucosa; Nasal Swab  Result Value Ref Range Status   MRSA by PCR Next Gen NOT DETECTED NOT DETECTED Final    Comment: (NOTE) The GeneXpert MRSA Assay (FDA approved for NASAL specimens only), is one component of a comprehensive MRSA colonization surveillance program. It is not intended to diagnose MRSA infection nor to guide or monitor treatment for MRSA infections. Test performance is not FDA approved in patients less than 73 years old. Performed at [facility], [place] [address] [place], [place] [address]   Culture, blood (Routine X 2) w Reflex to ID Panel     Status: None   Collection Time: 09/19/22  12:15 PM   Specimen: BLOOD RIGHT HAND  Result Value Ref Range Status   Specimen Description BLOOD RIGHT HAND  Final   Special Requests IN PEDIATRIC BOTTLE Blood Culture adequate volume  Final   Culture   Final    NO GROWTH 5 DAYS Performed at [facility], [place]  [address], [place], [place] [address]    Report Status 09/24/2022 FINAL  Final  Culture, blood (Routine X 2) w Reflex to ID Panel     Status: None   Collection Time: 09/19/22  2:37 PM   Specimen: BLOOD RIGHT HAND  Result Value Ref Range Status   Specimen Description BLOOD RIGHT HAND  Final   Special Requests   Final    BOTTLES DRAWN AEROBIC AND ANAEROBIC Blood Culture adequate volume   Culture   Final    NO GROWTH 5 DAYS Performed at [facility], [place] [address], [place], [place] [address]    Report Status 09/24/2022 FINAL  Final  CSF culture w Gram Stain     Status: None   Collection Time: 09/20/22  1:28 PM   Specimen: PATH Cytology CSF; Cerebrospinal Fluid  Result Value Ref Range Status   Specimen Description CSF  Final   Special Requests NONE  Final   Gram Stain   Final    WBC PRESENT, PREDOMINANTLY MONONUCLEAR NO ORGANISMS SEEN    Culture   Final    NO GROWTH 3 DAYS Performed at [facility], [place] [address] [place], [place] [address]    Report Status 09/23/2022 FINAL  Final         Radiology Studies: No results found.      Scheduled Meds:  apixaban  5 mg Oral BID   aspirin  81 mg Oral Daily   bumetanide  3 mg Oral BID   carvedilol  25 mg Oral BID WC   gabapentin  400 mg Oral TID   insulin aspart  0-15 Units Subcutaneous TID WC   insulin aspart  0-5 Units Subcutaneous QHS   insulin detemir  10 Units Subcutaneous QHS   levETIRAcetam  1,000 mg Oral BID   levothyroxine  88 mcg Oral Q0600   multivitamin with minerals  1 tablet Oral Daily   pantoprazole (PROTONIX) IV  40 mg Intravenous Q24H   potassium & sodium phosphates  1 packet Oral TID WC & HS   pravastatin  40 mg Oral Daily    sacubitril-valsartan  1 tablet Oral BID   terbinafine  250 mg Oral Daily   vitamin B-12  1,500 mcg Oral Daily   Continuous Infusions:  sodium chloride 10 mL/hr at 09/20/22 2200   levETIRAcetam       LOS: 7 days    Time spent: 35 minutes.    Dana Allan, MD  [facility] Pager #: [phone] 7PM-7AM contact night coverage as above

## 2022-09-25 NOTE — Progress Notes (Addendum)
Rounding Note    Patient Name: Michael [name] Date of Encounter: 09/25/2022  [facility] Cardiologist: Dina Rich, MD   Subjective   In SR. Rate around 100. Morning labs not available for review.  Inpatient Medications    Scheduled Meds:  apixaban  5 mg Oral BID   aspirin  81 mg Oral Daily   bumetanide  3 mg Oral BID   carvedilol  25 mg Oral BID WC   gabapentin  400 mg Oral TID   insulin aspart  0-15 Units Subcutaneous TID WC   insulin aspart  0-5 Units Subcutaneous QHS   insulin detemir  10 Units Subcutaneous QHS   levETIRAcetam  1,000 mg Oral BID   levothyroxine  88 mcg Oral Q0600   multivitamin with minerals  1 tablet Oral Daily   pantoprazole (PROTONIX) IV  40 mg Intravenous Q24H   potassium & sodium phosphates  1 packet Oral TID WC & HS   pravastatin  40 mg Oral Daily   sacubitril-valsartan  1 tablet Oral BID   terbinafine  250 mg Oral Daily   vitamin B-12  1,500 mcg Oral Daily   Continuous Infusions:  sodium chloride 10 mL/hr at 09/20/22 2200   levETIRAcetam     PRN Meds: sodium chloride, acetaminophen **OR** acetaminophen, Gerhardt's butt cream, LORazepam, morphine injection, mouth rinse   Vital Signs    Vitals:   09/25/22 0138 09/25/22 0157 09/25/22 0800 09/25/22 0816  BP: 127/81 114/75 (!) 161/88   Pulse: 87  (!) 109 91  Resp: 15 13 12    Temp: 98.6 F (37 C)  98.7 F (37.1 C)   TempSrc: Oral  Oral   SpO2: 96%  92%   Weight:      Height:        Intake/Output Summary (Last 24 hours) at 09/25/2022 0859 Last data filed at 09/24/2022 1900 Gross per 24 hour  Intake --  Output 2450 ml  Net -2450 ml      09/21/2022   12:00 AM 09/20/2022    5:00 AM 09/19/2022    6:00 AM  Last 3 Weights  Weight (lbs) 205 lb 11 oz 192 lb 7.4 oz 188 lb 15 oz  Weight (kg) 93.3 kg 87.3 kg 85.7 kg      Telemetry    SR 100 bpm - Personally Reviewed  ECG    SR, rate 80, septal infarct pattern - Personally Reviewed  Physical Exam   GEN:  No acute distress.   Neck: No JVD Cardiac: RRR, no murmurs, rubs, or gallops.  Respiratory: Clear to auscultation bilaterally. GI: Soft, nontender, non-distended  MS: No edema; No deformity. Neuro:  Nonfocal  Psych: Normal affect   Labs    High Sensitivity Troponin:  No results for input(s): "TROPONINIHS" in the last 720 hours.   Chemistry Recent Labs  Lab 09/18/22 1021 09/19/22 0640 09/22/22 2121 09/23/22 0143 09/23/22 0546 09/23/22 1511 09/24/22 0221  NA 135   < > 137   < > 139 136 138  K 3.9   < > 2.6*   < > 3.9 3.8 3.3*  CL 98   < > 99   < > 104 102 103  CO2 25   < > 24   < > 25 28 25   GLUCOSE 248*   < > 162*   < > 111* 166* 153*  BUN 9   < > 5*   < > <5* 5* <5*  CREATININE 0.75   < >  1.00   < > 0.78 0.85 0.69  CALCIUM 8.3*   < > 7.9*   < > 8.0* 7.8* 8.0*  MG 1.2*   < > 2.0  --  1.8  --  1.5*  PROT 6.9  --   --   --   --   --  4.9*  ALBUMIN 3.7   < > 2.6*  --  2.7*  --  2.6*  AST 22  --   --   --   --   --  19  ALT 16  --   --   --   --   --  20  ALKPHOS 77  --   --   --   --   --  58  BILITOT 0.4  --   --   --   --   --  0.7  GFRNONAA >60   < > >60   < > >60 >60 >60  ANIONGAP 12   < > 14   < > 10 6 10    < > = values in this interval not displayed.    Lipids No results for input(s): "CHOL", "TRIG", "HDL", "LABVLDL", "LDLCALC", "CHOLHDL" in the last 168 hours.  Hematology Recent Labs  Lab 09/18/22 1021 09/19/22 0639 09/24/22 0221  WBC 4.3 7.2 4.5  RBC 5.28 4.90 4.59  HGB 14.1 13.2 12.6*  HCT 44.8 40.3 37.9*  MCV 84.8 82.2 82.6  MCH 26.7 26.9 27.5  MCHC 31.5 32.8 33.2  RDW 15.5 14.9 15.7*  PLT 292 297 171   Thyroid  Recent Labs  Lab 09/19/22 0640  TSH 1.056    BNPNo results for input(s): "BNP", "PROBNP" in the last 168 hours.  DDimer  Recent Labs  Lab 09/23/22 0546  DDIMER 0.71*     Radiology    CT Angio Chest Pulmonary Embolism (PE) W or WO Contrast  Result Date: 09/23/2022 CLINICAL DATA:  Short of breath, suspected pulmonary embolus  EXAM: CT ANGIOGRAPHY CHEST WITH CONTRAST TECHNIQUE: Multidetector CT imaging of the chest was performed using the standard protocol during bolus administration of intravenous contrast. Multiplanar CT image reconstructions and MIPs were obtained to evaluate the vascular anatomy. RADIATION DOSE REDUCTION: This exam was performed according to the departmental dose-optimization program which includes automated exposure control, adjustment of the mA and/or kV according to patient size and/or use of iterative reconstruction technique. CONTRAST:  75mL OMNIPAQUE IOHEXOL 350 MG/ML SOLN COMPARISON:  09/23/2022 FINDINGS: Cardiovascular: This is a technically adequate evaluation of the pulmonary vasculature. No filling defects or pulmonary emboli. Mild cardiomegaly without pericardial effusion. Atherosclerosis throughout the coronary vasculature greatest in the LAD and circumflex distributions. Normal caliber of the thoracic aorta. Atherosclerosis of the aortic arch. Mediastinum/Nodes: No enlarged mediastinal, hilar, or axillary lymph nodes. Thyroid gland, trachea, and esophagus demonstrate no significant findings. Lungs/Pleura: There are trace bilateral pleural effusions. No acute airspace disease or pneumothorax. There is mild bilateral bronchial wall thickening greatest at the lung bases. The central airways are patent. Upper Abdomen: Nodular contour of the liver capsule may reflect underlying cirrhosis. No acute upper abdominal findings. Musculoskeletal: No acute or destructive bony lesions. Reconstructed images demonstrate no additional findings. Review of the MIP images confirms the above findings. IMPRESSION: 1. No evidence of pulmonary embolus. 2. Trace bilateral pleural effusions. 3. Mild bilateral bronchial wall thickening which could reflect reactive airway disease or bronchitis. No acute airspace disease. 4. Cirrhosis. 5. Aortic Atherosclerosis (ICD10-I70.0). Coronary artery atherosclerosis. Electronically Signed    By: Casimiro Needle  Manson Passey M.D.   On: 09/23/2022 19:19   ECHOCARDIOGRAM COMPLETE  Result Date: 09/23/2022    ECHOCARDIOGRAM REPORT   Patient Name:   Michael [name] Date of Exam: 09/23/2022 Medical Rec #:  [id number]       Height:       59.0 in Accession #:    [id number]      Weight:       205.7 lb Date of Birth:  [birth year]        BSA:          1.866 m Patient Age:    76 years        BP:           118/74 mmHg Patient Gender: M               HR:           79 bpm. Exam Location:  Inpatient Procedure: 2D Echo, Cardiac Doppler and Color Doppler Indications:    Atrial Fibrillation I48.91  History:        Patient has prior history of Echocardiogram examinations, most                 recent 10/25/2021. CHF, COPD; Risk Factors:Hypertension,                 Dyslipidemia and Diabetes.  Sonographer:    Leta Jungling RDCS Referring Phys: [id number] VASUNDHRA RATHORE IMPRESSIONS  1. Left ventricular ejection fraction, by estimation, is 50 to 55%. The left ventricle has low normal function. Left ventricular endocardial border not optimally defined to evaluate regional wall motion. Left ventricular diastolic parameters are consistent with Grade II diastolic dysfunction (pseudonormalization). Elevated left ventricular end-diastolic pressure.  2. Right ventricular systolic function is normal. The right ventricular size is normal.  3. Left atrial size was mildly dilated.  4. The mitral valve is degenerative. Trivial mitral valve regurgitation. No evidence of mitral stenosis.  5. The aortic valve is tricuspid. Aortic valve regurgitation is not visualized. Aortic valve sclerosis/calcification is present, without any evidence of aortic stenosis. Aortic valve area, by VTI measures 1.23 cm. Aortic valve mean gradient measures 4.5 mmHg. Aortic valve Vmax measures 1.53 m/s. FINDINGS  Left Ventricle: Left ventricular ejection fraction, by estimation, is 50 to 55%. The left ventricle has low normal function. Left ventricular endocardial border not  optimally defined to evaluate regional wall motion. The left ventricular internal cavity  size was normal in size. There is no left ventricular hypertrophy. Left ventricular diastolic parameters are consistent with Grade II diastolic dysfunction (pseudonormalization). Elevated left ventricular end-diastolic pressure. Right Ventricle: The right ventricular size is normal. No increase in right ventricular wall thickness. Right ventricular systolic function is normal. Left Atrium: Left atrial size was mildly dilated. Right Atrium: Right atrial size was normal in size. Pericardium: There is no evidence of pericardial effusion. Presence of epicardial fat layer. Mitral Valve: The mitral valve is degenerative in appearance. There is mild calcification of the mitral valve leaflet(s). Mild mitral annular calcification. Trivial mitral valve regurgitation. No evidence of mitral valve stenosis. Tricuspid Valve: The tricuspid valve is grossly normal. Tricuspid valve regurgitation is not demonstrated. No evidence of tricuspid stenosis. Aortic Valve: The aortic valve is tricuspid. Aortic valve regurgitation is not visualized. Aortic valve sclerosis/calcification is present, without any evidence of aortic stenosis. Aortic valve mean gradient measures 4.5 mmHg. Aortic valve peak gradient measures 9.4 mmHg. Aortic valve area, by VTI measures 1.23 cm. Pulmonic Valve: The pulmonic valve was grossly normal.  Pulmonic valve regurgitation is not visualized. No evidence of pulmonic stenosis. Aorta: The aortic root and ascending aorta are structurally normal, with no evidence of dilitation. IAS/Shunts: No atrial level shunt detected by color flow Doppler.  LEFT VENTRICLE PLAX 2D LVIDd:         4.60 cm   Diastology LVIDs:         3.30 cm   LV e' medial:    6.00 cm/s LV PW:         1.10 cm   LV E/e' medial:  15.8 LV IVS:        1.00 cm   LV e' lateral:   8.52 cm/s LVOT diam:     1.90 cm   LV E/e' lateral: 11.1 LV SV:         33 LV SV Index:    18 LVOT Area:     2.84 cm  RIGHT VENTRICLE RV S prime:     14.90 cm/s TAPSE (M-mode): 1.8 cm LEFT ATRIUM             Index        RIGHT ATRIUM           Index LA diam:        3.60 cm 1.93 cm/m   RA Area:     13.30 cm LA Vol (A2C):   53.0 ml 28.40 ml/m  RA Volume:   27.20 ml  14.57 ml/m LA Vol (A4C):   61.3 ml 32.84 ml/m LA Biplane Vol: 60.4 ml 32.36 ml/m  AORTIC VALVE AV Area (Vmax):    1.34 cm AV Area (Vmean):   1.19 cm AV Area (VTI):     1.23 cm AV Vmax:           153.00 cm/s AV Vmean:          100.050 cm/s AV VTI:            0.272 m AV Peak Grad:      9.4 mmHg AV Mean Grad:      4.5 mmHg LVOT Vmax:         72.20 cm/s LVOT Vmean:        42.100 cm/s LVOT VTI:          0.118 m LVOT/AV VTI ratio: 0.43  AORTA Ao Root diam: 3.30 cm Ao Asc diam:  3.50 cm MITRAL VALVE MV Area (PHT): 3.36 cm    SHUNTS MV Decel Time: 226 msec    Systemic VTI:  0.12 m MV E velocity: 94.60 cm/s  Systemic Diam: 1.90 cm MV A velocity: 74.55 cm/s MV E/A ratio:  1.27 Chilton Si MD Electronically signed by Chilton Si MD Signature Date/Time: 09/23/2022/6:05:10 PM    Final     Cardiac Studies   above  Patient Profile     76 y.o. male with a hx of heart failure with improved EF, COPD, HLD, HTN, Type 2 DM, NASH, chronic lymphedema who is being seen 09/24/2022 for the evaluation of Atrial Fibrillation.  Assessment & Plan    Paroxysmal Atrial Fibrillation, new onset  - Patient has been in and out of afib this admission. No known history  - Per telemetry, patient is now in NSR - K 3.3, mag 1.5 yesterday, I repleted, no labs available to review this am. Ordered. - Patient needs supplementation to maintain K>4, mag >2.  - Stop IV diltiazem  - Continue eliquis 5 mg BID  - Increase carvedilol to 25 mg BID  - Can consider  outpatient sleep study    Chronic Heart Failure with Improved EF  - Patient had EF <20% in 2020.  - Echocardiogram this admission with EF 50-55% - Patient euvolemic on exam  - Increase  carvedilol to 25 mg BID, continue entresto 49-51 mg BID - Resume home bumex 3 mg BID  - diuresing aggressively, even before resuming bumex. Monitor lytes closely.   Otherwise per primary  - Acute encephalopathy, seizure activity  - Hypothyroidism  - Type 2 Dm  - Chronic lymphedema  - Chronic pain      For questions or updates, please contact [facility] Please consult [email or web address] for contact info under   ADDENDUM: electrolytes reviewed, mg repleted. Cardiology will sign off.    Signed, Parke Poisson, MD  09/25/2022, 8:59 AM

## 2022-09-25 NOTE — TOC Progression Note (Signed)
Transition of Care [facility]) - Progression Note    Patient Details  Name: Michael [name] MRN: [id number] Date of Birth: 09-Sep-1946  Transition of Care [facility] CM/SW [place], [place] Phone Number: 09/25/2022, 4:42 PM  Clinical Narrative:     CSW contacted pt's niece and explained SNF's she was interested were unable to offer a bed. CSW provided list of SNF offers and medicare star ratings verbally. Daughter chooses [facility]. CSW answer questions about medicaid and ALF for in the future. Daughter is unsure if pt would qualify for medicaid as he has a pension and SSN. CSW provided contact info for [facility] for further guidance on ALF in the future.   Expected Discharge Plan: [place] Barriers to Discharge: Continued Medical Work up, Ship broker, SNF Pending bed offer  Expected Discharge Plan and Services In-house Referral: Clinical Social Work   Post Acute Care Choice: [place] Living arrangements for the past 2 months: [place] Determinants of Health (SDOH) Interventions SDOH Screenings   Food Insecurity: No Food Insecurity (09/19/2022)  Housing: Low Risk  (09/19/2022)  Transportation Needs: No Transportation Needs (09/19/2022)  Utilities: Not At Risk (09/19/2022)  Alcohol Screen: Low Risk  (04/18/2022)  Depression (PHQ2-9): Low Risk  (06/20/2022)  Financial Resource Strain: Low Risk  (04/18/2022)  Physical Activity: Inactive (04/18/2022)  Social Connections: Moderately Isolated (04/18/2022)  Stress: No Stress Concern Present (04/18/2022)  Tobacco Use: Medium Risk (09/18/2022)    Readmission Risk Interventions     No data to display

## 2022-09-25 NOTE — Progress Notes (Signed)
Pt  afib RV hr 120-140s. Provider notified

## 2022-09-25 NOTE — Progress Notes (Signed)
Mobility Specialist Progress Note    09/25/22 1118  Mobility  Activity Transferred from bed to chair  Level of Assistance Contact guard assist, steadying assist  Assistive Device Other (Comment) (HHA)  Activity Response Tolerated well  Mobility Referral Yes  $Mobility charge 1 Mobility   Pt received and agreeable. No complaints. Completed sets of LE exercises and x1 STS w/ minA once in chair. Left with call bell in reach and visitors present.   Hildred Alamin Mobility Specialist  Please Psychologist, sport and exercise or Rehab Office at [phone]

## 2022-09-25 NOTE — Progress Notes (Signed)
0455: EKG done showed NSR. Provider notified

## 2022-09-26 DIAGNOSIS — I169 Hypertensive crisis, unspecified: Secondary | ICD-10-CM | POA: Diagnosis not present

## 2022-09-26 LAB — GLUCOSE, CAPILLARY
Glucose-Capillary: 211 mg/dL — ABNORMAL HIGH (ref 70–99)
Glucose-Capillary: 139 mg/dL — ABNORMAL HIGH (ref 70–99)
Glucose-Capillary: 165 mg/dL — ABNORMAL HIGH (ref 70–99)
Glucose-Capillary: 223 mg/dL — ABNORMAL HIGH (ref 70–99)
Glucose-Capillary: 190 mg/dL — ABNORMAL HIGH (ref 70–99)

## 2022-09-26 MED ORDER — PANTOPRAZOLE SODIUM 40 MG PO TBEC
40.0000 mg | DELAYED_RELEASE_TABLET | Freq: Every day | ORAL | Status: DC
  Administered 2022-09-26 – 2022-10-01 (×6): 40 mg via ORAL
  Filled 2022-09-26 (×6): qty 1

## 2022-09-26 NOTE — Progress Notes (Addendum)
[place] auth request submitted for [facility], reference # [id number]. Anticipate determination in the next 24-48 hours.   Wandra Feinstein, MSW, LCSW [phone] (coverage)

## 2022-09-26 NOTE — Progress Notes (Signed)
PROGRESS NOTE    Michael [name]  ZOX:[id number] DOB: 16-May-1946 DOA: 09/18/2022 PCP: Lindell Spar, MD  Outpatient Specialists:     Brief Narrative:  As per H&P done on admission: "Michael [name] is a 76 y.o. M with PMH significant for HFpEF, COPD, HL, DM, NASH, chronic lymphedema who presented to [facility] with AMS.  He had a fall four days prior to admission and was evaluated in the ED with negative head CT.  Home health went to check on patient and found him confused and called EMS.  He was initially hypertensive with BP 220/92.  While in the ED pt had a tonic-clonic seizure which ceased with Ativan.  Head CT again negative.  An MRI brain was obtained showing equivocal/subtle DWI hyperintensity within the left hippocampus could be consistent with encephalitis, no evidence of PRES.   Neurology was consulted, findings could be seen in the setting of HSV encephalitis or post-ictally in the absence  of infection.   An LP was attempted, but not successful given history of surgery at L4-L5.   Neurology recommended transfer to [facility] for IR to perform LP and for continuous EEG.    His labs were significant for Mag 1.2, no leukocytosis and normal renal function.  He was loaded with Keppra and given Acyclovir and transferred to [facility]"  09/21/2022: CSF fluid analysis revealed 0 WBC.  Antibiotics and acyclovir have been discontinued.  Patient seen alongside patient's nurse.  Patient remains confused.  No significant history from patient.  Patient has pulled NG tube severally.  For bedside swallowing evaluation by the nurse.  09/22/2022: Patient seen.  Patient remains significantly confused.  Patient developed A-fib RVR later in the day.  Patient was started Cardizem drip and Eliquis.  Low threshold to consult the cardiology team.  Likely, patient may have paroxysmal atrial fibrillation.  09/23/2022: Heart rate is controlled on Cardizem drip.  Mildly elevated D-dimer.  No recent echocardiogram.  Patient seen  alongside patient's niece.  Patient has no children.  Patient's niece is next of kin.  There are concerns about disposition.  Will consult TOC team.  Pursue echocardiogram and CTA chest.  Low threshold to consult the cardiology team.  Confusion is improving.  09/24/2022: Patient seen.  No new complaints today.  Patient continues to improve.  Cardiology input is appreciated.  IV Cardizem drip to be continued.  Lab work done today revealed sodium of 136, potassium of 3.3, BUN of less than 5, serum creatinine 0.69 and magnesium of 1.5.  Will continue to monitor and replete electrolytes.  09/25/2022: Patient seen.  Heart rate is controlled.  Potassium is 4.6 today.  Magnesium of 1.7.  Cardiology input is appreciated.  Cardizem changed to oral.  Hopefully, patient will be discharged in the next 24 to 48 hours.  09/26/2022: Patient continues to improve.  No new labs today.  Confusion also seems to be improving.  Patient be discharged with skilled nursing facility for short-term rehab.   Assessment & Plan:   Principal Problem:   Hypertensive crisis Active Problems:   Disorientation  Acute Encephalopathy 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  -Transfer to [facility], neurology consulted for likely LTM EEG -seizure precautions, prn Ativan -IR consult in the AM for LP -continue Acyclovir -loaded with Keppra, prn ativan -seizure precautions -no history of ETOH use -check b12 level 09/21/2022: See above documentation.  CSF analysis is nonrevealing.  Antibiotics and acyclovir have been discontinued.  Neurology input is appreciated.  LTM EEG did not reveal any definite seizure.  Patient is still on antiseizure medications.  Patient remains confused. 09/22/2022: Patient remains significantly confused. 09/24/2022: Slowly improving. 09/25/2022: Encephalopathy has resolved  significantly. 09/26/2022: Confusion has continued to improve.  A-fib with RVR: -Start Cardizem drip. -Start Eliquis. -Low threshold to reconsult cardiology team. -Low threshold to repeat echocardiogram. -Normal TSH. -D-dimer was minimally elevated. -CTA chest is negative for pulmonary embolism. 09/25/2022: Heart rate is controlled.  Cardizem is being transitioned to oral.  Cardiology is directing. 09/26/2022: Heart rate is controlled.  Echocardiogram revealed:   1. Left ventricular ejection fraction, by estimation, is 50 to 55%. The  left ventricle has low normal function. Left ventricular endocardial  border not optimally defined to evaluate regional wall motion. Left  ventricular diastolic parameters are  consistent with Grade II diastolic dysfunction (pseudonormalization).  Elevated left ventricular end-diastolic pressure.   2. Right ventricular systolic function is normal. The right ventricular  size is normal.   3. Left atrial size was mildly dilated.   4. The mitral valve is degenerative. Trivial mitral valve regurgitation.  No evidence of mitral stenosis.   5. The aortic valve is tricuspid. Aortic valve regurgitation is not  visualized. Aortic valve sclerosis/calcification is present, without any  evidence of aortic stenosis. Aortic valve area, by VTI measures 1.23 cm.  Aortic valve mean gradient measures  4.5 mmHg. Aortic valve Vmax measures 1.53 m/s.    Hypertensive Emergency HFpEF HL     Hypomagnesemia Continue to monitor and replete.  Hypophosphatemia: -Continue to monitor and replete..  Hypothyroidism -continue synthroid and check TSH 09/21/2022: TSH was 1.056.  Hypoalbuminemia: -This may have prognostic significance. -Cannot rule out moderate malnutrition/PEM.     Type 2 DM -SSI     Chronic lymphedema Chronic pain   DVT prophylaxis: Eliquis Code Status: Full code Family Communication:  Disposition Plan: This will depend on hospital  course   Consultants:  Neurology  Procedures:  LTM EEG  Antimicrobials:  None   Subjective: No new complaints.   Objective: Vitals:   09/26/22 0359 09/26/22 1005 09/26/22 1222 09/26/22 1818  BP: (!) 149/85 (!) 141/90 104/68 120/74  Pulse: 79 86 81 86  Resp: 15 14 17    Temp: (!) 97.2 F (36.2 C)  98.1 F (36.7 C)   TempSrc: Oral  Oral   SpO2:   93%   Weight:      Height:        Intake/Output Summary (Last 24 hours) at 09/26/2022 1834 Last data filed at 09/26/2022 1300 Gross per 24 hour  Intake 1080 ml  Output 1850 ml  Net -770 ml    Filed Weights   09/19/22 0600 09/20/22 0500 09/21/22 0000  Weight: 85.7 kg 87.3 kg 93.3 kg    Examination:  General exam: Appears calm and comfortable.  Remains confused Respiratory system: Clear to auscultation.   Cardiovascular system: S1 & S2 heard  Gastrointestinal system: Abdomen is obese, soft and non tender. . Central nervous system: Confusion is improving.  Data Reviewed: I have personally reviewed following labs and imaging studies  CBC: Recent Labs  Lab 09/24/22 0221 09/25/22 1102  WBC 4.5 4.1  NEUTROABS 3.1  --   HGB 12.6* 13.2  HCT 37.9* 39.3  MCV 82.6 82.6  PLT 171 250    Basic Metabolic Panel: Recent Labs  Lab 09/21/22 1148 09/22/22 0612 09/22/22 1216 09/22/22 2121 09/23/22 0143 09/23/22 0546 09/23/22 1511  09/24/22 0221 09/25/22 1102  NA 139  --  137 137 138 139 136 138 139  K 3.6  --  3.2* 2.6* 3.2* 3.9 3.8 3.3* 4.6  CL 105  --  101 99 101 104 102 103 104  CO2 26  --  24 24 24 25 28 25 28   GLUCOSE 74  --  86 162* 138* 111* 166* 153* 184*  BUN 9  --  6* 5* 5* <5* 5* <5* 5*  CREATININE 0.94  --  0.75 1.00 0.86 0.78 0.85 0.69 0.94  CALCIUM 7.9*  --  8.0* 7.9* 7.8* 8.0* 7.8* 8.0* 8.3*  MG 1.7 1.3*  --  2.0  --  1.8  --  1.5* 1.7  PHOS 2.0*  --  1.7* 1.7*  --  1.4* 3.5  --   --     GFR: Estimated Creatinine Clearance: 63.3 mL/min (by C-G formula based on SCr of 0.94 mg/dL). Liver  Function Tests: Recent Labs  Lab 09/22/22 1216 09/22/22 2121 09/23/22 0546 09/24/22 0221  AST  --   --   --  19  ALT  --   --   --  20  ALKPHOS  --   --   --  58  BILITOT  --   --   --  0.7  PROT  --   --   --  4.9*  ALBUMIN 2.7* 2.6* 2.7* 2.6*    No results for input(s): "LIPASE", "AMYLASE" in the last 168 hours. No results for input(s): "AMMONIA" in the last 168 hours.  Coagulation Profile: No results for input(s): "INR", "PROTIME" in the last 168 hours. Cardiac Enzymes: No results for input(s): "CKTOTAL", "CKMB", "CKMBINDEX", "TROPONINI" in the last 168 hours. BNP (last 3 results) No results for input(s): "PROBNP" in the last 8760 hours. HbA1C: No results for input(s): "HGBA1C" in the last 72 hours. CBG: Recent Labs  Lab 09/25/22 1618 09/25/22 2150 09/26/22 0739 09/26/22 1230 09/26/22 1627  GLUCAP 204* 211* 139* 165* 223*    Lipid Profile: No results for input(s): "CHOL", "HDL", "LDLCALC", "TRIG", "CHOLHDL", "LDLDIRECT" in the last 72 hours. Thyroid Function Tests: No results for input(s): "TSH", "T4TOTAL", "FREET4", "T3FREE", "THYROIDAB" in the last 72 hours.  Anemia Panel: No results for input(s): "VITAMINB12", "FOLATE", "FERRITIN", "TIBC", "IRON", "RETICCTPCT" in the last 72 hours.  Urine analysis:    Component Value Date/Time   COLORURINE STRAW (A) 09/18/2022 1100   APPEARANCEUR CLEAR 09/18/2022 1100   APPEARANCEUR Clear 12/21/2021 1054   LABSPEC 1.010 09/18/2022 1100   PHURINE 6.0 09/18/2022 1100   GLUCOSEU >=500 (A) 09/18/2022 1100   HGBUR SMALL (A) 09/18/2022 1100   BILIRUBINUR NEGATIVE 09/18/2022 1100   BILIRUBINUR negative 04/25/2022 0857   BILIRUBINUR Negative 12/21/2021 1054   KETONESUR NEGATIVE 09/18/2022 1100   PROTEINUR >=300 (A) 09/18/2022 1100   UROBILINOGEN 0.2 04/25/2022 0857   UROBILINOGEN 0.2 08/24/2013 1129   NITRITE NEGATIVE 09/18/2022 1100   LEUKOCYTESUR NEGATIVE 09/18/2022 1100   Sepsis  Labs: @LABRCNTIP (procalcitonin:4,lacticidven:4)  ) Recent Results (from the past 240 hour(s))  Resp panel by RT-PCR (RSV, Flu A&B, Covid) Anterior Nasal Swab     Status: None   Collection Time: 09/18/22 11:00 AM   Specimen: Anterior Nasal Swab  Result Value Ref Range Status   SARS Coronavirus 2 by RT PCR NEGATIVE NEGATIVE Final    Comment: (NOTE) SARS-CoV-2 target nucleic acids are NOT DETECTED.  The SARS-CoV-2 RNA is generally detectable in upper respiratory specimens during the acute phase of infection.  The lowest concentration of SARS-CoV-2 viral copies this assay can detect is 138 copies/mL. A negative result does not preclude SARS-Cov-2 infection and should not be used as the sole basis for treatment or other patient management decisions. A negative result may occur with  improper specimen collection/handling, submission of specimen other than nasopharyngeal swab, presence of viral mutation(s) within the areas targeted by this assay, and inadequate number of viral copies(<138 copies/mL). A negative result must be combined with clinical observations, patient history, and epidemiological information. The expected result is Negative.  Fact Sheet for Patients:  EntrepreneurPulse.com.au  Fact Sheet for Healthcare Providers:  IncredibleEmployment.be  This test is no t yet approved or cleared by the [place] FDA and  has been authorized for detection and/or diagnosis of SARS-CoV-2 by FDA under an Emergency Use Authorization (EUA). This EUA will remain  in effect (meaning this test can be used) for the duration of the COVID-19 declaration under Section 564(b)(1) of the Act, 21 U.S.C.section 360bbb-3(b)(1), unless the authorization is terminated  or revoked sooner.       Influenza A by PCR NEGATIVE NEGATIVE Final   Influenza B by PCR NEGATIVE NEGATIVE Final    Comment: (NOTE) The Xpert Xpress SARS-CoV-2/FLU/RSV plus assay is intended as  an aid in the diagnosis of influenza from Nasopharyngeal swab specimens and should not be used as a sole basis for treatment. Nasal washings and aspirates are unacceptable for Xpert Xpress SARS-CoV-2/FLU/RSV testing.  Fact Sheet for Patients: EntrepreneurPulse.com.au  Fact Sheet for Healthcare Providers: IncredibleEmployment.be  This test is not yet approved or cleared by the [place] FDA and has been authorized for detection and/or diagnosis of SARS-CoV-2 by FDA under an Emergency Use Authorization (EUA). This EUA will remain in effect (meaning this test can be used) for the duration of the COVID-19 declaration under Section 564(b)(1) of the Act, 21 U.S.C. section 360bbb-3(b)(1), unless the authorization is terminated or revoked.     Resp Syncytial Virus by PCR NEGATIVE NEGATIVE Final    Comment: (NOTE) Fact Sheet for Patients: EntrepreneurPulse.com.au  Fact Sheet for Healthcare Providers: IncredibleEmployment.be  This test is not yet approved or cleared by the [place] FDA and has been authorized for detection and/or diagnosis of SARS-CoV-2 by FDA under an Emergency Use Authorization (EUA). This EUA will remain in effect (meaning this test can be used) for the duration of the COVID-19 declaration under Section 564(b)(1) of the Act, 21 U.S.C. section 360bbb-3(b)(1), unless the authorization is terminated or revoked.  Performed at [facility], [address], [place], [place] [address]   MRSA Next Gen by PCR, Nasal     Status: None   Collection Time: 09/19/22  2:45 AM   Specimen: Nasal Mucosa; Nasal Swab  Result Value Ref Range Status   MRSA by PCR Next Gen NOT DETECTED NOT DETECTED Final    Comment: (NOTE) The GeneXpert MRSA Assay (FDA approved for NASAL specimens only), is one component of a comprehensive MRSA colonization surveillance program. It is not intended to diagnose MRSA infection  nor to guide or monitor treatment for MRSA infections. Test performance is not FDA approved in patients less than 54 years old. Performed at [facility], [place] [address] [place], [place] [address]   Culture, blood (Routine X 2) w Reflex to ID Panel     Status: None   Collection Time: 09/19/22 12:15 PM   Specimen: BLOOD RIGHT HAND  Result Value Ref Range Status   Specimen Description BLOOD RIGHT HAND  Final   Special Requests IN PEDIATRIC BOTTLE Blood Culture adequate volume  Final   Culture   Final    NO GROWTH 5 DAYS Performed at [facility], [place] [address], [place], [place] [address]    Report Status 09/24/2022 FINAL  Final  Culture, blood (Routine X 2) w Reflex to ID Panel     Status: None   Collection Time: 09/19/22  2:37 PM   Specimen: BLOOD RIGHT HAND  Result Value Ref Range Status   Specimen Description BLOOD RIGHT HAND  Final   Special Requests   Final    BOTTLES DRAWN AEROBIC AND ANAEROBIC Blood Culture adequate volume   Culture   Final    NO GROWTH 5 DAYS Performed at [facility], [place] [address] [place], [place] [address]    Report Status 09/24/2022 FINAL  Final  CSF culture w Gram Stain     Status: None   Collection Time: 09/20/22  1:28 PM   Specimen: PATH Cytology CSF; Cerebrospinal Fluid  Result Value Ref Range Status   Specimen Description CSF  Final   Special Requests NONE  Final   Gram Stain   Final    WBC PRESENT, PREDOMINANTLY MONONUCLEAR NO ORGANISMS SEEN    Culture   Final    NO GROWTH 3 DAYS Performed at [facility], [place] [address], [place], [place] [address]    Report Status 09/23/2022 FINAL  Final         Radiology Studies: No results found.      Scheduled Meds:  apixaban  5 mg Oral BID   aspirin  81 mg Oral Daily   bumetanide  3 mg Oral BID   carvedilol  25 mg Oral BID WC   gabapentin  400 mg Oral TID   insulin aspart  0-15 Units Subcutaneous TID WC   insulin aspart  0-5 Units Subcutaneous QHS    insulin detemir  10 Units Subcutaneous QHS   levETIRAcetam  1,000 mg Oral BID   levothyroxine  88 mcg Oral Q0600   multivitamin with minerals  1 tablet Oral Daily   pantoprazole  40 mg Oral Daily   potassium & sodium phosphates  1 packet Oral TID WC & HS   pravastatin  40 mg Oral Daily   sacubitril-valsartan  1 tablet Oral BID   terbinafine  250 mg Oral Daily   vitamin B-12  1,500 mcg Oral Daily   Continuous Infusions:  sodium chloride 10 mL/hr at 09/20/22 2200     LOS: 8 days    Time spent: 35 minutes.    Dana Allan, MD  [facility] Pager #: [phone] 7PM-7AM contact night coverage as above

## 2022-09-26 NOTE — Progress Notes (Signed)
Physical Therapy Treatment Patient Details Name: Michael [name] MRN: [id number] DOB: Jul 14, 1946 Today's Date: 09/26/2022   History of Present Illness Pt is a 76 y/o M presenting to [facility] on 12/10 for evaluation on AMS, recent fall x4 days ago and seen in ED/discharged after head CT negative for acute findings. Seizure in ED on 12/20. MRI brain revealing hyperintensity in L hippocampus consistent with encephalitis. Transfer to [facility] for LTM EEG. PMH includes COPD, HTN, DM2, CHF, NASH, BLE edema, and chronic lymphedema.    PT Comments    Patient continues to require assistance with functional mobility. He has limited activity tolerance. Patient performed incremental scooting along the edge of bed with activity tolerance limited by fatigue and mild dizziness reported initially with sitting upright. The patient is hopeful to discharge home, however SNF is recommended for short term rehab as patient reports he lives alone. PT will continue to follow to maximize independence and facilitate return to prior level of function.    Recommendations for follow up therapy are one component of a multi-disciplinary discharge planning process, led by the attending physician.  Recommendations may be updated based on patient status, additional functional criteria and insurance authorization.  Follow Up Recommendations  Skilled nursing-short term rehab (<3 hours/day) Can patient physically be transported by private vehicle: No   Assistance Recommended at Discharge Frequent or constant Supervision/Assistance  Patient can return home with the following A little help with walking and/or transfers;A little help with bathing/dressing/bathroom;Assistance with cooking/housework;Assist for transportation;Direct supervision/assist for financial management;Direct supervision/assist for medications management   Equipment Recommendations  None recommended by PT    Recommendations for Other Services       Precautions /  Restrictions Precautions Precautions: Fall Restrictions Weight Bearing Restrictions: No     Mobility  Bed Mobility Overal bed mobility: Needs Assistance Bed Mobility: Supine to Sit, Sit to Supine     Supine to sit: Min assist Sit to supine: Min assist   General bed mobility comments: increased time and effort required. verbal cues for technique    Transfers Overall transfer level: Needs assistance                Lateral/Scoot Transfers: Min guard General transfer comment: patient performed lateral scoot along the side of bed x 5 bouts with cues for technique. activity tolerance limited by fatigue    Ambulation/Gait               General Gait Details: patient reports he is non ambulatory at baseline   Stairs             Wheelchair Mobility    Modified Rankin (Stroke Patients Only)       Balance Overall balance assessment: Needs assistance Sitting-balance support: Feet supported Sitting balance-Leahy Scale: Fair                                      Cognition Arousal/Alertness: Awake/alert Behavior During Therapy: WFL for tasks assessed/performed Overall Cognitive Status: Impaired/Different from baseline Area of Impairment: Memory, Orientation, Problem solving                 Orientation Level: Disoriented to, Time, Situation, Place   Memory: Decreased short-term memory Following Commands: Follows one step commands consistently Safety/Judgement: Decreased awareness of safety              Exercises      General Comments General comments (  skin integrity, edema, etc.): vitals stable throughotu session      Pertinent Vitals/Pain Pain Assessment Pain Assessment: Faces Faces Pain Scale: Hurts a little bit Pain Location: generalized leg pain (chronic) Pain Descriptors / Indicators: Discomfort    Home Living                          Prior Function            PT Goals (current goals can now be  found in the care plan section) Acute Rehab PT Goals Patient Stated Goal: to return home to live independently PT Goal Formulation: With patient Time For Goal Achievement: 10/07/22 Potential to Achieve Goals: Good Progress towards PT goals: Progressing toward goals    Frequency    Min 3X/week      PT Plan Current plan remains appropriate    Co-evaluation              AM-PAC PT "6 Clicks" Mobility   Outcome Measure  Help needed turning from your back to your side while in a flat bed without using bedrails?: A Little Help needed moving from lying on your back to sitting on the side of a flat bed without using bedrails?: A Little Help needed moving to and from a bed to a chair (including a wheelchair)?: A Little Help needed standing up from a chair using your arms (e.g., wheelchair or bedside chair)?: Total Help needed to walk in hospital room?: Total Help needed climbing 3-5 steps with a railing? : Total 6 Click Score: 12    End of Session   Activity Tolerance: Patient tolerated treatment well Patient left: in bed;with call bell/phone within reach;with bed alarm set   PT Visit Diagnosis: Muscle weakness (generalized) (M62.81);Other abnormalities of gait and mobility (R26.89)     Time: 6808-8110 PT Time Calculation (min) (ACUTE ONLY): 20 min  Charges:  $Therapeutic Activity: 8-22 mins                     Minna Merritts, PT, MPT   Percell Locus 09/26/2022, 1:20 PM

## 2022-09-27 DIAGNOSIS — I169 Hypertensive crisis, unspecified: Secondary | ICD-10-CM | POA: Diagnosis not present

## 2022-09-27 LAB — CBC WITH DIFFERENTIAL/PLATELET
Abs Immature Granulocytes: 0.02 10*3/uL (ref 0.00–0.07)
Basophils Absolute: 0.1 10*3/uL (ref 0.0–0.1)
Basophils Relative: 1 %
Eosinophils Absolute: 0.2 10*3/uL (ref 0.0–0.5)
Eosinophils Relative: 3 %
HCT: 39.9 % (ref 39.0–52.0)
Hemoglobin: 13.3 g/dL (ref 13.0–17.0)
Immature Granulocytes: 0 %
Lymphocytes Relative: 25 %
Lymphs Abs: 1.3 10*3/uL (ref 0.7–4.0)
MCH: 27.8 pg (ref 26.0–34.0)
MCHC: 33.3 g/dL (ref 30.0–36.0)
MCV: 83.3 fL (ref 80.0–100.0)
Monocytes Absolute: 0.5 10*3/uL (ref 0.1–1.0)
Monocytes Relative: 9 %
Neutro Abs: 3.2 10*3/uL (ref 1.7–7.7)
Neutrophils Relative %: 62 %
Platelets: 191 10*3/uL (ref 150–400)
RBC: 4.79 MIL/uL (ref 4.22–5.81)
RDW: 15.8 % — ABNORMAL HIGH (ref 11.5–15.5)
WBC: 5.2 10*3/uL (ref 4.0–10.5)
nRBC: 0 % (ref 0.0–0.2)

## 2022-09-27 LAB — RENAL FUNCTION PANEL
Albumin: 2.7 g/dL — ABNORMAL LOW (ref 3.5–5.0)
Anion gap: 13 (ref 5–15)
BUN: 11 mg/dL (ref 8–23)
CO2: 27 mmol/L (ref 22–32)
Calcium: 8.5 mg/dL — ABNORMAL LOW (ref 8.9–10.3)
Chloride: 99 mmol/L (ref 98–111)
Creatinine, Ser: 0.9 mg/dL (ref 0.61–1.24)
GFR, Estimated: >60 mL/min (ref >60)
Glucose, Bld: 180 mg/dL — ABNORMAL HIGH (ref 70–99)
Phosphorus: 4 mg/dL (ref 2.5–4.6)
Potassium: 4.3 mmol/L (ref 3.5–5.1)
Sodium: 139 mmol/L (ref 135–145)

## 2022-09-27 LAB — GLUCOSE, CAPILLARY
Glucose-Capillary: 231 mg/dL — ABNORMAL HIGH (ref 70–99)
Glucose-Capillary: 135 mg/dL — ABNORMAL HIGH (ref 70–99)
Glucose-Capillary: 231 mg/dL — ABNORMAL HIGH (ref 70–99)
Glucose-Capillary: 216 mg/dL — ABNORMAL HIGH (ref 70–99)

## 2022-09-27 LAB — MAGNESIUM: Magnesium: 1.6 mg/dL — ABNORMAL LOW (ref 1.7–2.4)

## 2022-09-27 MED ORDER — MAGNESIUM SULFATE 2 GM/50ML IV SOLN
2.0000 g | Freq: Once | INTRAVENOUS | Status: AC
  Administered 2022-09-27: 2 g via INTRAVENOUS
  Filled 2022-09-27: qty 50

## 2022-09-27 NOTE — Progress Notes (Signed)
PROGRESS NOTE    Michael [name]  FBP:[id number] DOB: [birth year] DOA: 09/18/2022 PCP: Lindell Spar, MD  Outpatient Specialists:     Brief Narrative:  As per H&P done on admission: "Michael [name] is a 76 y.o. M with PMH significant for HFpEF, COPD, HL, DM, NASH, chronic lymphedema who presented to [facility] with AMS.  He had a fall four days prior to admission and was evaluated in the ED with negative head CT.  Home health went to check on patient and found him confused and called EMS.  He was initially hypertensive with BP 220/92.  While in the ED pt had a tonic-clonic seizure which ceased with Ativan.  Head CT again negative.  An MRI brain was obtained showing equivocal/subtle DWI hyperintensity within the left hippocampus could be consistent with encephalitis, no evidence of PRES.   Neurology was consulted, findings could be seen in the setting of HSV encephalitis or post-ictally in the absence  of infection.   An LP was attempted, but not successful given history of surgery at L4-L5.   Neurology recommended transfer to [facility] for IR to perform LP and for continuous EEG.    His labs were significant for Mag 1.2, no leukocytosis and normal renal function.  He was loaded with Keppra and given Acyclovir and transferred to [facility]"  09/21/2022: CSF fluid analysis revealed 0 WBC.  Antibiotics and acyclovir have been discontinued.  Patient seen alongside patient's nurse.  Patient remains confused.  No significant history from patient.  Patient has pulled NG tube severally.  For bedside swallowing evaluation by the nurse.  09/22/2022: Patient seen.  Patient remains significantly confused.  Patient developed A-fib RVR later in the day.  Patient was started Cardizem drip and Eliquis.  Low threshold to consult the cardiology team.  Likely, patient may have paroxysmal atrial fibrillation.  09/23/2022: Heart rate is controlled on Cardizem drip.  Mildly elevated D-dimer.  No recent echocardiogram.  Patient seen  alongside patient's niece.  Patient has no children.  Patient's niece is next of kin.  There are concerns about disposition.  Will consult TOC team.  Pursue echocardiogram and CTA chest.  Low threshold to consult the cardiology team.  Confusion is improving.  09/24/2022: Patient seen.  No new complaints today.  Patient continues to improve.  Cardiology input is appreciated.  IV Cardizem drip to be continued.  Lab work done today revealed sodium of 136, potassium of 3.3, BUN of less than 5, serum creatinine 0.69 and magnesium of 1.5.  Will continue to monitor and replete electrolytes.  09/25/2022: Patient seen.  Heart rate is controlled.  Potassium is 4.6 today.  Magnesium of 1.7.  Cardiology input is appreciated.  Cardizem changed to oral.  Hopefully, patient will be discharged in the next 24 to 48 hours.  09/26/2022: Patient continues to improve.  No new labs today.  Confusion also seems to be improving.  Patient be discharged with skilled nursing facility for short-term rehab.  09/27/2022: Patient seen.  No new changes.  Memory remains significantly impaired.  Awaiting disposition.  Continue to monitor and replete electrolytes.  Nursing staff report dysuria.  Will proceed with urinalysis.  Assessment & Plan:   Principal Problem:   Hypertensive crisis Active Problems:   Disorientation  Acute Encephalopathy 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  -Transfer to [facility], neurology consulted for likely LTM EEG -seizure precautions, prn Ativan -IR consult  in the AM for LP -continue Acyclovir -loaded with Keppra, prn ativan -seizure precautions -no history of ETOH use -check b12 level 09/21/2022: See above documentation.  CSF analysis is nonrevealing.  Antibiotics and acyclovir have been discontinued.  Neurology input is appreciated.  LTM EEG did not reveal any definite  seizure.  Patient is still on antiseizure medications.  Patient remains confused. 09/22/2022: Patient remains significantly confused. 09/24/2022: Slowly improving. 09/25/2022: Encephalopathy has resolved significantly. 09/27/2022: Memory deficit persists..  A-fib with RVR: -Start Cardizem drip. -Start Eliquis. -Low threshold to reconsult cardiology team. -Low threshold to repeat echocardiogram. -Normal TSH. -D-dimer was minimally elevated. -CTA chest is negative for pulmonary embolism. 09/25/2022: Heart rate is controlled.  Cardizem is being transitioned to oral.  Cardiology is directing. 09/26/2022: Heart rate is controlled.  Echocardiogram revealed:   1. Left ventricular ejection fraction, by estimation, is 50 to 55%. The  left ventricle has low normal function. Left ventricular endocardial  border not optimally defined to evaluate regional wall motion. Left  ventricular diastolic parameters are  consistent with Grade II diastolic dysfunction (pseudonormalization).  Elevated left ventricular end-diastolic pressure.   2. Right ventricular systolic function is normal. The right ventricular  size is normal.   3. Left atrial size was mildly dilated.   4. The mitral valve is degenerative. Trivial mitral valve regurgitation.  No evidence of mitral stenosis.   5. The aortic valve is tricuspid. Aortic valve regurgitation is not  visualized. Aortic valve sclerosis/calcification is present, without any  evidence of aortic stenosis. Aortic valve area, by VTI measures 1.23 cm.  Aortic valve mean gradient measures  4.5 mmHg. Aortic valve Vmax measures 1.53 m/s.    Hypertensive Emergency HFpEF HL     Hypomagnesemia Continue to monitor and replete.  Hypophosphatemia: -Continue to monitor and replete..  Hypothyroidism -continue synthroid and check TSH 09/21/2022: TSH was 1.056.  Hypoalbuminemia: -This may have prognostic significance. -Cannot rule out moderate  malnutrition/PEM.     Type 2 DM -SSI     Chronic lymphedema Chronic pain   DVT prophylaxis: Eliquis Code Status: Full code Family Communication:  Disposition Plan: This will depend on hospital course   Consultants:  Neurology  Procedures:  LTM EEG  Antimicrobials:  None   Subjective: No new complaints.   Objective: Vitals:   09/26/22 1818 09/26/22 2117 09/27/22 0059 09/27/22 0907  BP: 120/74 (!) 92/52 106/70 134/72  Pulse: 86 86 82 86  Resp: 17 19 15    Temp:  98.1 F (36.7 C) 98.1 F (36.7 C) 97.9 F (36.6 C)  TempSrc:  Oral Oral Oral  SpO2:  93%  95%  Weight:      Height:        Intake/Output Summary (Last 24 hours) at 09/27/2022 1823 Last data filed at 09/27/2022 1318 Gross per 24 hour  Intake 365.98 ml  Output 1950 ml  Net -1584.02 ml    Filed Weights   09/19/22 0600 09/20/22 0500 09/21/22 0000  Weight: 85.7 kg 87.3 kg 93.3 kg    Examination:  General exam: Appears calm and comfortable.  Remains confused Respiratory system: Clear to auscultation.   Cardiovascular system: S1 & S2 heard  Gastrointestinal system: Abdomen is obese, soft and non tender. . Central nervous system: Confusion is improving.  Data Reviewed: I have personally reviewed following labs and imaging studies  CBC: Recent Labs  Lab 09/24/22 0221 09/25/22 1102 09/27/22 0647  WBC 4.5 4.1 5.2  NEUTROABS 3.1  --  3.2  HGB 12.6* 13.2 13.3  HCT 37.9* 39.3 39.9  MCV 82.6 82.6 83.3  PLT 171 175 423    Basic Metabolic Panel: Recent Labs  Lab 09/22/22 1216 09/22/22 2121 09/23/22 0143 09/23/22 0546 09/23/22 1511 09/24/22 0221 09/25/22 1102 09/27/22 0647  NA 137 137   < > 139 136 138 139 139  K 3.2* 2.6*   < > 3.9 3.8 3.3* 4.6 4.3  CL 101 99   < > 104 102 103 104 99  CO2 24 24   < > 25 28 25 28 27   GLUCOSE 86 162*   < > 111* 166* 153* 184* 180*  BUN 6* 5*   < > <5* 5* <5* 5* 11  CREATININE 0.75 1.00   < > 0.78 0.85 0.69 0.94 0.90  CALCIUM 8.0* 7.9*   < > 8.0*  7.8* 8.0* 8.3* 8.5*  MG  --  2.0  --  1.8  --  1.5* 1.7 1.6*  PHOS 1.7* 1.7*  --  1.4* 3.5  --   --  4.0   < > = values in this interval not displayed.    GFR: Estimated Creatinine Clearance: 66.1 mL/min (by C-G formula based on SCr of 0.9 mg/dL). Liver Function Tests: Recent Labs  Lab 09/22/22 1216 09/22/22 2121 09/23/22 0546 09/24/22 0221 09/27/22 0647  AST  --   --   --  19  --   ALT  --   --   --  20  --   ALKPHOS  --   --   --  58  --   BILITOT  --   --   --  0.7  --   PROT  --   --   --  4.9*  --   ALBUMIN 2.7* 2.6* 2.7* 2.6* 2.7*    No results for input(s): "LIPASE", "AMYLASE" in the last 168 hours. No results for input(s): "AMMONIA" in the last 168 hours.  Coagulation Profile: No results for input(s): "INR", "PROTIME" in the last 168 hours. Cardiac Enzymes: No results for input(s): "CKTOTAL", "CKMB", "CKMBINDEX", "TROPONINI" in the last 168 hours. BNP (last 3 results) No results for input(s): "PROBNP" in the last 8760 hours. HbA1C: No results for input(s): "HGBA1C" in the last 72 hours. CBG: Recent Labs  Lab 09/26/22 1627 09/26/22 2134 09/27/22 0906 09/27/22 1315 09/27/22 1753  GLUCAP 223* 190* 231* 135* 231*    Lipid Profile: No results for input(s): "CHOL", "HDL", "LDLCALC", "TRIG", "CHOLHDL", "LDLDIRECT" in the last 72 hours. Thyroid Function Tests: No results for input(s): "TSH", "T4TOTAL", "FREET4", "T3FREE", "THYROIDAB" in the last 72 hours.  Anemia Panel: No results for input(s): "VITAMINB12", "FOLATE", "FERRITIN", "TIBC", "IRON", "RETICCTPCT" in the last 72 hours.  Urine analysis:    Component Value Date/Time   COLORURINE STRAW (A) 09/18/2022 1100   APPEARANCEUR CLEAR 09/18/2022 1100   APPEARANCEUR Clear 12/21/2021 1054   LABSPEC 1.010 09/18/2022 1100   PHURINE 6.0 09/18/2022 1100   GLUCOSEU >=500 (A) 09/18/2022 1100   HGBUR SMALL (A) 09/18/2022 1100   BILIRUBINUR NEGATIVE 09/18/2022 1100   BILIRUBINUR negative 04/25/2022 0857    BILIRUBINUR Negative 12/21/2021 1054   KETONESUR NEGATIVE 09/18/2022 1100   PROTEINUR >=300 (A) 09/18/2022 1100   UROBILINOGEN 0.2 04/25/2022 0857   UROBILINOGEN 0.2 08/24/2013 1129   NITRITE NEGATIVE 09/18/2022 1100   LEUKOCYTESUR NEGATIVE 09/18/2022 1100   Sepsis Labs: @LABRCNTIP (procalcitonin:4,lacticidven:4)  ) Recent Results (from the past 240 hour(s))  Resp panel by RT-PCR (RSV, Flu A&B, Covid) Anterior Nasal Swab  Status: None   Collection Time: 09/18/22 11:00 AM   Specimen: Anterior Nasal Swab  Result Value Ref Range Status   SARS Coronavirus 2 by RT PCR NEGATIVE NEGATIVE Final    Comment: (NOTE) SARS-CoV-2 target nucleic acids are NOT DETECTED.  The SARS-CoV-2 RNA is generally detectable in upper respiratory specimens during the acute phase of infection. The lowest concentration of SARS-CoV-2 viral copies this assay Michael detect is 138 copies/mL. A negative result does not preclude SARS-Cov-2 infection and should not be used as the sole basis for treatment or other patient management decisions. A negative result may occur with  improper specimen collection/handling, submission of specimen other than nasopharyngeal swab, presence of viral mutation(s) within the areas targeted by this assay, and inadequate number of viral copies(<138 copies/mL). A negative result must be combined with clinical observations, patient history, and epidemiological information. The expected result is Negative.  Fact Sheet for Patients:  EntrepreneurPulse.com.au  Fact Sheet for Healthcare Providers:  IncredibleEmployment.be  This test is no t yet approved or cleared by the [place] FDA and  has been authorized for detection and/or diagnosis of SARS-CoV-2 by FDA under an Emergency Use Authorization (EUA). This EUA will remain  in effect (meaning this test Michael be used) for the duration of the COVID-19 declaration under Section 564(b)(1) of the Act,  21 U.S.C.section 360bbb-3(b)(1), unless the authorization is terminated  or revoked sooner.       Influenza A by PCR NEGATIVE NEGATIVE Final   Influenza B by PCR NEGATIVE NEGATIVE Final    Comment: (NOTE) The Xpert Xpress SARS-CoV-2/FLU/RSV plus assay is intended as an aid in the diagnosis of influenza from Nasopharyngeal swab specimens and should not be used as a sole basis for treatment. Nasal washings and aspirates are unacceptable for Xpert Xpress SARS-CoV-2/FLU/RSV testing.  Fact Sheet for Patients: EntrepreneurPulse.com.au  Fact Sheet for Healthcare Providers: IncredibleEmployment.be  This test is not yet approved or cleared by the [place] FDA and has been authorized for detection and/or diagnosis of SARS-CoV-2 by FDA under an Emergency Use Authorization (EUA). This EUA will remain in effect (meaning this test Michael be used) for the duration of the COVID-19 declaration under Section 564(b)(1) of the Act, 21 U.S.C. section 360bbb-3(b)(1), unless the authorization is terminated or revoked.     Resp Syncytial Virus by PCR NEGATIVE NEGATIVE Final    Comment: (NOTE) Fact Sheet for Patients: EntrepreneurPulse.com.au  Fact Sheet for Healthcare Providers: IncredibleEmployment.be  This test is not yet approved or cleared by the [place] FDA and has been authorized for detection and/or diagnosis of SARS-CoV-2 by FDA under an Emergency Use Authorization (EUA). This EUA will remain in effect (meaning this test Michael be used) for the duration of the COVID-19 declaration under Section 564(b)(1) of the Act, 21 U.S.C. section 360bbb-3(b)(1), unless the authorization is terminated or revoked.  Performed at [facility], [address] [place], [place] [address]   MRSA Next Gen by PCR, Nasal     Status: None   Collection Time: 09/19/22  2:45 AM   Specimen: Nasal Mucosa; Nasal Swab  Result Value Ref  Range Status   MRSA by PCR Next Gen NOT DETECTED NOT DETECTED Final    Comment: (NOTE) The GeneXpert MRSA Assay (FDA approved for NASAL specimens only), is one component of a comprehensive MRSA colonization surveillance program. It is not intended to diagnose MRSA infection nor to guide or monitor treatment for MRSA infections. Test performance is not FDA approved in patients less than  60 years old. Performed at [facility], [place] [address], [place], [place] [address]   Culture, blood (Routine X 2) w Reflex to ID Panel     Status: None   Collection Time: 09/19/22 12:15 PM   Specimen: BLOOD RIGHT HAND  Result Value Ref Range Status   Specimen Description BLOOD RIGHT HAND  Final   Special Requests IN PEDIATRIC BOTTLE Blood Culture adequate volume  Final   Culture   Final    NO GROWTH 5 DAYS Performed at [facility], [place] [address], [place], [place] [address]    Report Status 09/24/2022 FINAL  Final  Culture, blood (Routine X 2) w Reflex to ID Panel     Status: None   Collection Time: 09/19/22  2:37 PM   Specimen: BLOOD RIGHT HAND  Result Value Ref Range Status   Specimen Description BLOOD RIGHT HAND  Final   Special Requests   Final    BOTTLES DRAWN AEROBIC AND ANAEROBIC Blood Culture adequate volume   Culture   Final    NO GROWTH 5 DAYS Performed at [facility], [place] [address] [place], [place] [address]    Report Status 09/24/2022 FINAL  Final  CSF culture w Gram Stain     Status: None   Collection Time: 09/20/22  1:28 PM   Specimen: PATH Cytology CSF; Cerebrospinal Fluid  Result Value Ref Range Status   Specimen Description CSF  Final   Special Requests NONE  Final   Gram Stain   Final    WBC PRESENT, PREDOMINANTLY MONONUCLEAR NO ORGANISMS SEEN    Culture   Final    NO GROWTH 3 DAYS Performed at [facility], [place] [address] [place], [place] [address]    Report Status 09/23/2022 FINAL  Final         Radiology Studies: No results  found.      Scheduled Meds:  apixaban  5 mg Oral BID   aspirin  81 mg Oral Daily   bumetanide  3 mg Oral BID   carvedilol  25 mg Oral BID WC   gabapentin  400 mg Oral TID   insulin aspart  0-15 Units Subcutaneous TID WC   insulin aspart  0-5 Units Subcutaneous QHS   insulin detemir  10 Units Subcutaneous QHS   levETIRAcetam  1,000 mg Oral BID   levothyroxine  88 mcg Oral Q0600   multivitamin with minerals  1 tablet Oral Daily   pantoprazole  40 mg Oral Daily   potassium & sodium phosphates  1 packet Oral TID WC & HS   pravastatin  40 mg Oral Daily   sacubitril-valsartan  1 tablet Oral BID   terbinafine  250 mg Oral Daily   vitamin B-12  1,500 mcg Oral Daily   Continuous Infusions:  sodium chloride 10 mL/hr at 09/27/22 0100     LOS: 9 days    Time spent: 35 minutes.    Dana Allan, MD  [facility] Pager #: [phone] 7PM-7AM contact night coverage as above

## 2022-09-27 NOTE — Care Management Important Message (Signed)
Important Message  Patient Details  Name: Michael [name] MRN: [id number] Date of Birth: 02-17-1946   Medicare Important Message Given:  Yes     Shelda Altes 09/27/2022, 12:21 PM

## 2022-09-27 NOTE — Progress Notes (Signed)
Occupational Therapy Treatment Patient Details Name: Michael [name] MRN: [id number] DOB: 1946-04-26 Today's Date: 09/27/2022   History of present illness Pt is a 76 y/o M presenting to [facility] ED on 12/10 for evaluation on AMS, recent fall x4 days ago and seen in ED/discharged after head CT negative for acute findings. Seizure in ED on 12/20. MRI brain revealing hyperintensity in L hippocampus consistent with encephalitis. Transfer to [facility] for LTM EEG. PMH includes COPD, HTN, DM2, CHF, NASH, BLE edema, and chronic lymphedema.   OT comments  Pt. Seen for skilled OT treatment session.  Pt. Able to complete bed mobility with min guard/min a. Mod a for lb adls but reports he has A/E at home for use and able to complete without assistance.   Lateral scoots for back to head of bed. No physical assist or LOB during scoot but pt. Stated he was tired and need to stop. Able to get back into bed with min a.  Heavy reliance on bed rails for in/out of bed. States he has bed rails at home that he uses as well.  Continue next session with focus on toilet transfers.     Recommendations for follow up therapy are one component of a multi-disciplinary discharge planning process, led by the attending physician.  Recommendations may be updated based on patient status, additional functional criteria and insurance authorization.    Follow Up Recommendations  Skilled nursing-short term rehab (<3 hours/day)     Assistance Recommended at Discharge Frequent or constant Supervision/Assistance  Patient can return home with the following  A little help with bathing/dressing/bathroom;Assistance with cooking/housework;Direct supervision/assist for medications management;Direct supervision/assist for financial management;Assist for transportation;Help with stairs or ramp for entrance   Equipment Recommendations  None recommended by OT    Recommendations for Other Services      Precautions / Restrictions Precautions Precautions:  Fall Restrictions Weight Bearing Restrictions: No       Mobility Bed Mobility   Bed Mobility: Supine to Sit, Sit to Supine     Supine to sit: Min guard Sit to supine: Min assist   General bed mobility comments: increased time and effort required. verbal cues for technique, heavy reliance on bed rails. reports he has bed rails at home that he uses to get in/out.    Transfers                  Lateral/Scoot Transfers: Min guard General transfer comment: patient performed lateral scoot along the side of bed x 5 bouts with cues for technique. activity tolerance limited by fatigue     Balance                                           ADL either performed or assessed with clinical judgement   ADL Overall ADL's : Needs assistance/impaired                     Lower Body Dressing: Moderate assistance;Sitting/lateral leans Lower Body Dressing Details (indicate cue type and reason): pt. reports he uses a sock aide and reacher at home for LB dressing   Toilet Transfer Details (indicate cue type and reason): pt. on bed pan upon arrival. assisted off bed pan and with peri care.  for home he describes at home he transfers from w/c moving arm rest and scoots onto/off toilet.   Toileting - Water quality scientist  Details (indicate cue type and reason): pt. reports at home he is able to lean side to side without any issue to perform his care       General ADL Comments: describes systems in place for toileting and also reports having A/E at home for use for LB ADLs.    Extremity/Trunk Assessment              Vision       Perception     Praxis      Cognition Arousal/Alertness: Awake/alert Behavior During Therapy: WFL for tasks assessed/performed Overall Cognitive Status: Within Functional Limits for tasks assessed                                          Exercises      Shoulder Instructions       General Comments       Pertinent Vitals/ Pain       Pain Assessment Pain Assessment: No/denies pain  Home Living                                          Prior Functioning/Environment              Frequency  Min 2X/week        Progress Toward Goals  OT Goals(current goals can now be found in the care plan section)  Progress towards OT goals: Progressing toward goals     Plan Discharge plan remains appropriate    Co-evaluation                 AM-PAC OT "6 Clicks" Daily Activity     Outcome Measure   Help from another person eating meals?: A Little Help from another person taking care of personal grooming?: A Little Help from another person toileting, which includes using toliet, bedpan, or urinal?: A Little Help from another person bathing (including washing, rinsing, drying)?: A Lot Help from another person to put on and taking off regular upper body clothing?: A Little Help from another person to put on and taking off regular lower body clothing?: A Lot 6 Click Score: 16    End of Session    OT Visit Diagnosis: Unsteadiness on feet (R26.81);Other abnormalities of gait and mobility (R26.89);Muscle weakness (generalized) (M62.81)   Activity Tolerance Patient tolerated treatment well   Patient Left in bed;with call bell/phone within reach   Nurse Communication          Time: 2241-1464 OT Time Calculation (min): 10 min  Charges: OT General Charges $OT Visit: 1 Visit OT Treatments $Self Care/Home Management : 8-22 mins  Sonia Baller, COTA/L Acute Rehabilitation [phone]   Clearnce Sorrel Lorraine-COTA/L 09/27/2022, 1:12 PM

## 2022-09-27 NOTE — Progress Notes (Signed)
Pt c/o urinary burning constantly. Dr. Marthenia Rolling made aware via [facility]. Carroll Kinds RN

## 2022-09-27 NOTE — Progress Notes (Signed)
[employer] auth request remains pending at this time. If Michael [name] comes back today, [facility] can accept today but if Michael [name] remains pending, they are unable to accept until Tuesday 1/2 due to holiday weekend. Will continue to check for auth today and provide update if approved.   Wandra Feinstein, MSW, LCSW [phone] (coverage)

## 2022-09-28 DIAGNOSIS — I169 Hypertensive crisis, unspecified: Secondary | ICD-10-CM | POA: Diagnosis not present

## 2022-09-28 LAB — URINALYSIS, ROUTINE W REFLEX MICROSCOPIC
Bacteria, UA: NONE SEEN
Bilirubin Urine: NEGATIVE
Glucose, UA: NEGATIVE mg/dL
Hgb urine dipstick: NEGATIVE
Ketones, ur: NEGATIVE mg/dL
Nitrite: NEGATIVE
Protein, ur: NEGATIVE mg/dL
Specific Gravity, Urine: 1.008 (ref 1.005–1.030)
pH: 6 (ref 5.0–8.0)

## 2022-09-28 LAB — RENAL FUNCTION PANEL
Albumin: 2.8 g/dL — ABNORMAL LOW (ref 3.5–5.0)
Anion gap: 12 (ref 5–15)
BUN: 13 mg/dL (ref 8–23)
CO2: 26 mmol/L (ref 22–32)
Calcium: 8.3 mg/dL — ABNORMAL LOW (ref 8.9–10.3)
Chloride: 95 mmol/L — ABNORMAL LOW (ref 98–111)
Creatinine, Ser: 1.09 mg/dL (ref 0.61–1.24)
GFR, Estimated: >60 mL/min (ref >60)
Glucose, Bld: 209 mg/dL — ABNORMAL HIGH (ref 70–99)
Phosphorus: 4.4 mg/dL (ref 2.5–4.6)
Potassium: 4 mmol/L (ref 3.5–5.1)
Sodium: 133 mmol/L — ABNORMAL LOW (ref 135–145)

## 2022-09-28 LAB — GLUCOSE, CAPILLARY
Glucose-Capillary: 159 mg/dL — ABNORMAL HIGH (ref 70–99)
Glucose-Capillary: 198 mg/dL — ABNORMAL HIGH (ref 70–99)
Glucose-Capillary: 182 mg/dL — ABNORMAL HIGH (ref 70–99)
Glucose-Capillary: 181 mg/dL — ABNORMAL HIGH (ref 70–99)

## 2022-09-28 LAB — MAGNESIUM: Magnesium: 1.7 mg/dL (ref 1.7–2.4)

## 2022-09-28 MED ORDER — CARVEDILOL 12.5 MG PO TABS
12.5000 mg | ORAL_TABLET | Freq: Two times a day (BID) | ORAL | Status: DC
  Administered 2022-09-28 – 2022-10-01 (×7): 12.5 mg via ORAL
  Filled 2022-09-28 (×7): qty 1

## 2022-09-28 MED ORDER — MAGNESIUM SULFATE 2 GM/50ML IV SOLN
2.0000 g | Freq: Once | INTRAVENOUS | Status: AC
  Administered 2022-09-28: 2 g via INTRAVENOUS
  Filled 2022-09-28: qty 50

## 2022-09-28 MED ORDER — BUMETANIDE 1 MG PO TABS
1.0000 mg | ORAL_TABLET | Freq: Two times a day (BID) | ORAL | Status: DC
  Administered 2022-09-28 – 2022-10-01 (×7): 1 mg via ORAL
  Filled 2022-09-28 (×8): qty 1

## 2022-09-28 NOTE — Progress Notes (Signed)
PROGRESS NOTE    Michael [name]  OYD:[id number] DOB: Sep 24, 1946 DOA: 09/18/2022 PCP: Lindell Spar, MD  Outpatient Specialists:     Brief Narrative:  As per H&P done on admission: "Michael [name] is a 76 y.o. M with PMH significant for HFpEF, COPD, HL, DM, NASH, chronic lymphedema who presented to [facility] with AMS.  He had a fall four days prior to admission and was evaluated in the ED with negative head CT.  Home health went to check on patient and found him confused and called EMS.  He was initially hypertensive with BP 220/92.  While in the ED pt had a tonic-clonic seizure which ceased with Ativan.  Head CT again negative.  An MRI brain was obtained showing equivocal/subtle DWI hyperintensity within the left hippocampus could be consistent with encephalitis, no evidence of PRES.   Neurology was consulted, findings could be seen in the setting of HSV encephalitis or post-ictally in the absence  of infection.   An LP was attempted, but not successful given history of surgery at L4-L5.   Neurology recommended transfer to [facility] for IR to perform LP and for continuous EEG.    His labs were significant for Mag 1.2, no leukocytosis and normal renal function.  He was loaded with Keppra and given Acyclovir and transferred to [facility]"  09/21/2022: CSF fluid analysis revealed 0 WBC.  Antibiotics and acyclovir have been discontinued.  Patient seen alongside patient's nurse.  Patient remains confused.  No significant history from patient.  Patient has pulled NG tube severally.  For bedside swallowing evaluation by the nurse.  09/22/2022: Patient seen.  Patient remains significantly confused.  Patient developed A-fib RVR later in the day.  Patient was started Cardizem drip and Eliquis.  Low threshold to consult the cardiology team.  Likely, patient may have paroxysmal atrial fibrillation.  09/23/2022: Heart rate is controlled on Cardizem drip.  Mildly elevated D-dimer.  No recent echocardiogram.  Patient seen  alongside patient's niece.  Patient has no children.  Patient's niece is next of kin.  There are concerns about disposition.  Will consult TOC team.  Pursue echocardiogram and CTA chest.  Low threshold to consult the cardiology team.  Confusion is improving.  09/24/2022: Patient seen.  No new complaints today.  Patient continues to improve.  Cardiology input is appreciated.  IV Cardizem drip to be continued.  Lab work done today revealed sodium of 136, potassium of 3.3, BUN of less than 5, serum creatinine 0.69 and magnesium of 1.5.  Will continue to monitor and replete electrolytes.  09/25/2022: Patient seen.  Heart rate is controlled.  Potassium is 4.6 today.  Magnesium of 1.7.  Cardiology input is appreciated.  Cardizem changed to oral.  Hopefully, patient will be discharged in the next 24 to 48 hours.  09/26/2022: Patient continues to improve.  No new labs today.  Confusion also seems to be improving.  Patient be discharged with skilled nursing facility for short-term rehab.  09/27/2022: Patient seen.  No new changes.  Memory remains significantly impaired.  Awaiting disposition.  Continue to monitor and replete electrolytes.  Nursing staff report dysuria.  Will proceed with urinalysis.  09/28/2022: Patient's blood pressure was reported to be dropping.  Minimize antihypertensives.  Decrease Coreg from 25 Mg p.o. twice daily to 12.5 Mg p.o. twice daily.  Continue to monitor the heart rate.  Do not give antihypertensive for SBP less than 110 mmHg.  Decrease Bumex from 3 Mg p.o. twice daily to 1 Mg  p.o. twice daily.  Volume overload has improved significantly.  Will renal function is normal.  Cardiology input is appreciated.  Cardiology team has been involved in patient's new onset A-fib with RVR.  Further management will depend on hospital course.  Renal panel and magnesium ordered.  Patient reported having diarrhea stools.  Assessment & Plan:   Principal Problem:   Hypertensive crisis Active  Problems:   Disorientation  Acute Encephalopathy 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  -Transfer to [facility], neurology consulted for likely LTM EEG -seizure precautions, prn Ativan -IR consult in the AM for LP -continue Acyclovir -loaded with Keppra, prn ativan -seizure precautions -no history of ETOH use -check b12 level 09/21/2022: See above documentation.  CSF analysis is nonrevealing.  Antibiotics and acyclovir have been discontinued.  Neurology input is appreciated.  LTM EEG did not reveal any definite seizure.  Patient is still on antiseizure medications.  Patient remains confused. 09/22/2022: Patient remains significantly confused. 09/24/2022: Slowly improving. 09/25/2022: Encephalopathy has resolved significantly. 09/28/2022: Memory deficit persists..  A-fib with RVR: -Start Cardizem drip. -Start Eliquis. -Low threshold to reconsult cardiology team. -Low threshold to repeat echocardiogram. -Normal TSH. -D-dimer was minimally elevated. -CTA chest is negative for pulmonary embolism. 09/25/2022: Heart rate is controlled.  Cardizem is being transitioned to oral.  Cardiology is directing. 09/28/2022: Heart rate is controlled.  Echocardiogram revealed:   1. Left ventricular ejection fraction, by estimation, is 50 to 55%. The  left ventricle has low normal function. Left ventricular endocardial  border not optimally defined to evaluate regional wall motion. Left  ventricular diastolic parameters are  consistent with Grade II diastolic dysfunction (pseudonormalization).  Elevated left ventricular end-diastolic pressure.   2. Right ventricular systolic function is normal. The right ventricular  size is normal.   3. Left atrial size was mildly dilated.   4. The mitral valve is degenerative. Trivial mitral valve regurgitation.  No evidence of mitral  stenosis.   5. The aortic valve is tricuspid. Aortic valve regurgitation is not  visualized. Aortic valve sclerosis/calcification is present, without any  evidence of aortic stenosis. Aortic valve area, by VTI measures 1.23 cm.  Aortic valve mean gradient measures  4.5 mmHg. Aortic valve Vmax measures 1.53 m/s.    Hypertensive Emergency HFpEF HL     Hypomagnesemia Continue to monitor and replete. Magnesium is 1.7 today.  Hypophosphatemia: -Continue to monitor and replete. -Phosphorus is 4.2 today.  Hypothyroidism -continue synthroid and check TSH 09/21/2022: TSH was 1.056.  Hypoalbuminemia: -This may have prognostic significance. -Cannot rule out moderate malnutrition/PEM.     Type 2 DM -SSI     Chronic lymphedema Chronic pain   DVT prophylaxis: Eliquis Code Status: Full code Family Communication:  Disposition Plan: This will depend on hospital course   Consultants:  Neurology  Procedures:  LTM EEG  Antimicrobials:  None   Subjective: Patient reports having diarrhea stools.  Objective: Vitals:   09/28/22 0450 09/28/22 0814 09/28/22 0937 09/28/22 1003  BP: 114/68 118/82 (!) 75/48 105/79  Pulse: 60 82 80 73  Resp: 20 18 16 17   Temp: 97.8 F (36.6 C)  98.2 F (36.8 C)   TempSrc: Oral  Oral   SpO2: 99%  95% 96%  Weight:      Height:        Intake/Output Summary (Last 24 hours) at 09/28/2022 1006 Last data filed at 09/28/2022 0451 Gross per 24 hour  Intake --  Output 2051 ml  Net -2051 ml    Filed Weights   09/19/22 0600 09/20/22 0500 09/21/22 0000  Weight: 85.7 kg 87.3 kg 93.3 kg    Examination:  General exam: Appears calm and comfortable.  Remains confused Respiratory system: Clear to auscultation.   Cardiovascular system: S1 & S2 heard  Gastrointestinal system: Abdomen is obese, soft and non tender. . Central nervous system: Patient moves all extremities.  Patient is awake and alert.  Memory problems persist.  Data Reviewed: I  have personally reviewed following labs and imaging studies  CBC: Recent Labs  Lab 09/24/22 0221 09/25/22 1102 09/27/22 0647  WBC 4.5 4.1 5.2  NEUTROABS 3.1  --  3.2  HGB 12.6* 13.2 13.3  HCT 37.9* 39.3 39.9  MCV 82.6 82.6 83.3  PLT 171 175 972    Basic Metabolic Panel: Recent Labs  Lab 09/22/22 1216 09/22/22 2121 09/23/22 0143 09/23/22 0546 09/23/22 1511 09/24/22 0221 09/25/22 1102 09/27/22 0647  NA 137 137   < > 139 136 138 139 139  K 3.2* 2.6*   < > 3.9 3.8 3.3* 4.6 4.3  CL 101 99   < > 104 102 103 104 99  CO2 24 24   < > 25 28 25 28 27   GLUCOSE 86 162*   < > 111* 166* 153* 184* 180*  BUN 6* 5*   < > <5* 5* <5* 5* 11  CREATININE 0.75 1.00   < > 0.78 0.85 0.69 0.94 0.90  CALCIUM 8.0* 7.9*   < > 8.0* 7.8* 8.0* 8.3* 8.5*  MG  --  2.0  --  1.8  --  1.5* 1.7 1.6*  PHOS 1.7* 1.7*  --  1.4* 3.5  --   --  4.0   < > = values in this interval not displayed.    GFR: Estimated Creatinine Clearance: 66.1 mL/min (by C-G formula based on SCr of 0.9 mg/dL). Liver Function Tests: Recent Labs  Lab 09/22/22 1216 09/22/22 2121 09/23/22 0546 09/24/22 0221 09/27/22 0647  AST  --   --   --  19  --   ALT  --   --   --  20  --   ALKPHOS  --   --   --  58  --   BILITOT  --   --   --  0.7  --   PROT  --   --   --  4.9*  --   ALBUMIN 2.7* 2.6* 2.7* 2.6* 2.7*    No results for input(s): "LIPASE", "AMYLASE" in the last 168 hours. No results for input(s): "AMMONIA" in the last 168 hours.  Coagulation Profile: No results for input(s): "INR", "PROTIME" in the last 168 hours. Cardiac Enzymes: No results for input(s): "CKTOTAL", "CKMB", "CKMBINDEX", "TROPONINI" in the last 168 hours. BNP (last 3 results) No results for input(s): "PROBNP" in the last 8760 hours. HbA1C: No results for input(s): "HGBA1C" in the last 72 hours. CBG: Recent Labs  Lab 09/27/22 0906 09/27/22 1315 09/27/22 1753 09/27/22 2143 09/28/22 0936  GLUCAP 231* 135* 231* 216* 159*    Lipid Profile: No  results for input(s): "CHOL", "HDL", "LDLCALC", "TRIG", "CHOLHDL", "LDLDIRECT" in the last 72 hours. Thyroid Function Tests: No results for input(s): "TSH", "T4TOTAL", "FREET4", "T3FREE", "THYROIDAB" in the last 72 hours.  Anemia Panel: No results for input(s): "VITAMINB12", "FOLATE", "FERRITIN", "TIBC", "IRON", "RETICCTPCT" in the last 72 hours.  Urine analysis:    Component Value Date/Time  COLORURINE YELLOW 09/28/2022 0008   APPEARANCEUR CLOUDY (A) 09/28/2022 0008   APPEARANCEUR Clear 12/21/2021 1054   LABSPEC 1.008 09/28/2022 0008   PHURINE 6.0 09/28/2022 0008   GLUCOSEU NEGATIVE 09/28/2022 0008   HGBUR NEGATIVE 09/28/2022 0008   BILIRUBINUR NEGATIVE 09/28/2022 0008   BILIRUBINUR negative 04/25/2022 0857   BILIRUBINUR Negative 12/21/2021 1054   KETONESUR NEGATIVE 09/28/2022 0008   PROTEINUR NEGATIVE 09/28/2022 0008   UROBILINOGEN 0.2 04/25/2022 0857   UROBILINOGEN 0.2 08/24/2013 1129   NITRITE NEGATIVE 09/28/2022 0008   LEUKOCYTESUR TRACE (A) 09/28/2022 0008   Sepsis Labs: @LABRCNTIP (procalcitonin:4,lacticidven:4)  ) Recent Results (from the past 240 hour(s))  Resp panel by RT-PCR (RSV, Flu A&B, Covid) Anterior Nasal Swab     Status: None   Collection Time: 09/18/22 11:00 AM   Specimen: Anterior Nasal Swab  Result Value Ref Range Status   SARS Coronavirus 2 by RT PCR NEGATIVE NEGATIVE Final    Comment: (NOTE) SARS-CoV-2 target nucleic acids are NOT DETECTED.  The SARS-CoV-2 RNA is generally detectable in upper respiratory specimens during the acute phase of infection. The lowest concentration of SARS-CoV-2 viral copies this assay can detect is 138 copies/mL. A negative result does not preclude SARS-Cov-2 infection and should not be used as the sole basis for treatment or other patient management decisions. A negative result may occur with  improper specimen collection/handling, submission of specimen other than nasopharyngeal swab, presence of viral mutation(s)  within the areas targeted by this assay, and inadequate number of viral copies(<138 copies/mL). A negative result must be combined with clinical observations, patient history, and epidemiological information. The expected result is Negative.  Fact Sheet for Patients:  EntrepreneurPulse.com.au  Fact Sheet for Healthcare Providers:  IncredibleEmployment.be  This test is no t yet approved or cleared by the [place] FDA and  has been authorized for detection and/or diagnosis of SARS-CoV-2 by FDA under an Emergency Use Authorization (EUA). This EUA will remain  in effect (meaning this test can be used) for the duration of the COVID-19 declaration under Section 564(b)(1) of the Act, 21 U.S.C.section 360bbb-3(b)(1), unless the authorization is terminated  or revoked sooner.       Influenza A by PCR NEGATIVE NEGATIVE Final   Influenza B by PCR NEGATIVE NEGATIVE Final    Comment: (NOTE) The Xpert Xpress SARS-CoV-2/FLU/RSV plus assay is intended as an aid in the diagnosis of influenza from Nasopharyngeal swab specimens and should not be used as a sole basis for treatment. Nasal washings and aspirates are unacceptable for Xpert Xpress SARS-CoV-2/FLU/RSV testing.  Fact Sheet for Patients: EntrepreneurPulse.com.au  Fact Sheet for Healthcare Providers: IncredibleEmployment.be  This test is not yet approved or cleared by the [place] FDA and has been authorized for detection and/or diagnosis of SARS-CoV-2 by FDA under an Emergency Use Authorization (EUA). This EUA will remain in effect (meaning this test can be used) for the duration of the COVID-19 declaration under Section 564(b)(1) of the Act, 21 U.S.C. section 360bbb-3(b)(1), unless the authorization is terminated or revoked.     Resp Syncytial Virus by PCR NEGATIVE NEGATIVE Final    Comment: (NOTE) Fact Sheet for  Patients: EntrepreneurPulse.com.au  Fact Sheet for Healthcare Providers: IncredibleEmployment.be  This test is not yet approved or cleared by the [place] FDA and has been authorized for detection and/or diagnosis of SARS-CoV-2 by FDA under an Emergency Use Authorization (EUA). This EUA will remain in effect (meaning this test can be used) for the duration of the COVID-19 declaration under Section  564(b)(1) of the Act, 21 U.S.C. section 360bbb-3(b)(1), unless the authorization is terminated or revoked.  Performed at [facility], [address], [place], [place] [address]   MRSA Next Gen by PCR, Nasal     Status: None   Collection Time: 09/19/22  2:45 AM   Specimen: Nasal Mucosa; Nasal Swab  Result Value Ref Range Status   MRSA by PCR Next Gen NOT DETECTED NOT DETECTED Final    Comment: (NOTE) The GeneXpert MRSA Assay (FDA approved for NASAL specimens only), is one component of a comprehensive MRSA colonization surveillance program. It is not intended to diagnose MRSA infection nor to guide or monitor treatment for MRSA infections. Test performance is not FDA approved in patients less than 69 years old. Performed at [facility], [place] [address], [place], [place] [address]   Culture, blood (Routine X 2) w Reflex to ID Panel     Status: None   Collection Time: 09/19/22 12:15 PM   Specimen: BLOOD RIGHT HAND  Result Value Ref Range Status   Specimen Description BLOOD RIGHT HAND  Final   Special Requests IN PEDIATRIC BOTTLE Blood Culture adequate volume  Final   Culture   Final    NO GROWTH 5 DAYS Performed at [facility], [place] [address], [place], [place] [address]    Report Status 09/24/2022 FINAL  Final  Culture, blood (Routine X 2) w Reflex to ID Panel     Status: None   Collection Time: 09/19/22  2:37 PM   Specimen: BLOOD RIGHT HAND  Result Value Ref Range Status   Specimen Description BLOOD RIGHT HAND  Final   Special  Requests   Final    BOTTLES DRAWN AEROBIC AND ANAEROBIC Blood Culture adequate volume   Culture   Final    NO GROWTH 5 DAYS Performed at [facility], [place] [address], [place], [place] [address]    Report Status 09/24/2022 FINAL  Final  CSF culture w Gram Stain     Status: None   Collection Time: 09/20/22  1:28 PM   Specimen: PATH Cytology CSF; Cerebrospinal Fluid  Result Value Ref Range Status   Specimen Description CSF  Final   Special Requests NONE  Final   Gram Stain   Final    WBC PRESENT, PREDOMINANTLY MONONUCLEAR NO ORGANISMS SEEN    Culture   Final    NO GROWTH 3 DAYS Performed at [facility], [place] [address] [place], [place] [address]    Report Status 09/23/2022 FINAL  Final         Radiology Studies: No results found.      Scheduled Meds:  apixaban  5 mg Oral BID   aspirin  81 mg Oral Daily   bumetanide  1 mg Oral BID   carvedilol  12.5 mg Oral BID WC   gabapentin  400 mg Oral TID   insulin aspart  0-15 Units Subcutaneous TID WC   insulin aspart  0-5 Units Subcutaneous QHS   insulin detemir  10 Units Subcutaneous QHS   levETIRAcetam  1,000 mg Oral BID   levothyroxine  88 mcg Oral Q0600   multivitamin with minerals  1 tablet Oral Daily   pantoprazole  40 mg Oral Daily   potassium & sodium phosphates  1 packet Oral TID WC & HS   pravastatin  40 mg Oral Daily   sacubitril-valsartan  1 tablet Oral BID   terbinafine  250 mg Oral Daily  vitamin B-12  1,500 mcg Oral Daily   Continuous Infusions:  sodium chloride 10 mL/hr at 09/27/22 0100     LOS: 10 days    Time spent: 35 minutes.    Dana Allan, MD  [facility] Pager #: [phone] 7PM-7AM contact night coverage as above

## 2022-09-28 NOTE — Progress Notes (Signed)
Voicemail received from [facility] 12/29 at 7:15pm with auth details for [facility]. Unfortunately [facility] unable to accept pt until 1/2 due to holiday. [employer] auth valid 12/29-1/2.   Wandra Feinstein, MSW, LCSW [phone] (coverage)

## 2022-09-28 NOTE — Progress Notes (Signed)
Mobility Specialist Progress Note:   09/28/22 0953  Mobility  Activity Contraindicated/medical hold   Pt with Low BP. Will follow-up as time allows.   Gareth Eagle Jurline Folger Mobility Specialist Please contact via [employer] or  Rehab Office at [phone]

## 2022-09-29 DIAGNOSIS — I169 Hypertensive crisis, unspecified: Secondary | ICD-10-CM | POA: Diagnosis not present

## 2022-09-29 LAB — GLUCOSE, CAPILLARY
Glucose-Capillary: 140 mg/dL — ABNORMAL HIGH (ref 70–99)
Glucose-Capillary: 164 mg/dL — ABNORMAL HIGH (ref 70–99)
Glucose-Capillary: 183 mg/dL — ABNORMAL HIGH (ref 70–99)
Glucose-Capillary: 138 mg/dL — ABNORMAL HIGH (ref 70–99)
Glucose-Capillary: 217 mg/dL — ABNORMAL HIGH (ref 70–99)

## 2022-09-29 LAB — RENAL FUNCTION PANEL
Albumin: 2.9 g/dL — ABNORMAL LOW (ref 3.5–5.0)
Anion gap: 5 (ref 5–15)
BUN: 15 mg/dL (ref 8–23)
CO2: 32 mmol/L (ref 22–32)
Calcium: 9 mg/dL (ref 8.9–10.3)
Chloride: 97 mmol/L — ABNORMAL LOW (ref 98–111)
Creatinine, Ser: 1.21 mg/dL (ref 0.61–1.24)
GFR, Estimated: >60 mL/min (ref >60)
Glucose, Bld: 179 mg/dL — ABNORMAL HIGH (ref 70–99)
Phosphorus: 3.8 mg/dL (ref 2.5–4.6)
Potassium: 4.8 mmol/L (ref 3.5–5.1)
Sodium: 134 mmol/L — ABNORMAL LOW (ref 135–145)

## 2022-09-29 LAB — MAGNESIUM: Magnesium: 2.1 mg/dL (ref 1.7–2.4)

## 2022-09-29 NOTE — Progress Notes (Signed)
PROGRESS NOTE    Michael [name]  SFK:[id number] DOB: 01/01/1946 DOA: 09/18/2022 PCP: Lindell Spar, MD  Outpatient Specialists:     Brief Narrative:  As per H&P done on admission: "Michael [name] is a 76 y.o. M with PMH significant for HFpEF, COPD, HL, DM, NASH, chronic lymphedema who presented to [facility] with AMS.  He had a fall four days prior to admission and was evaluated in the ED with negative head CT.  Home health went to check on patient and found him confused and called EMS.  He was initially hypertensive with BP 220/92.  While in the ED pt had a tonic-clonic seizure which ceased with Ativan.  Head CT again negative.  An MRI brain was obtained showing equivocal/subtle DWI hyperintensity within the left hippocampus could be consistent with encephalitis, no evidence of PRES.   Neurology was consulted, findings could be seen in the setting of HSV encephalitis or post-ictally in the absence  of infection.   An LP was attempted, but not successful given history of surgery at L4-L5.   Neurology recommended transfer to [facility] for IR to perform LP and for continuous EEG.    His labs were significant for Mag 1.2, no leukocytosis and normal renal function.  He was loaded with Keppra and given Acyclovir and transferred to [facility]"  09/21/2022: CSF fluid analysis revealed 0 WBC.  Antibiotics and acyclovir have been discontinued.  Patient seen alongside patient's nurse.  Patient remains confused.  No significant history from patient.  Patient has pulled NG tube severally.  For bedside swallowing evaluation by the nurse.  09/22/2022: Patient seen.  Patient remains significantly confused.  Patient developed A-fib RVR later in the day.  Patient was started Cardizem drip and Eliquis.  Low threshold to consult the cardiology team.  Likely, patient may have paroxysmal atrial fibrillation.  09/23/2022: Heart rate is controlled on Cardizem drip.  Mildly elevated D-dimer.  No recent echocardiogram.  Patient seen  alongside patient's niece.  Patient has no children.  Patient's niece is next of kin.  There are concerns about disposition.  Will consult TOC team.  Pursue echocardiogram and CTA chest.  Low threshold to consult the cardiology team.  Confusion is improving.  09/24/2022: Patient seen.  No new complaints today.  Patient continues to improve.  Cardiology input is appreciated.  IV Cardizem drip to be continued.  Lab work done today revealed sodium of 136, potassium of 3.3, BUN of less than 5, serum creatinine 0.69 and magnesium of 1.5.  Will continue to monitor and replete electrolytes.  09/25/2022: Patient seen.  Heart rate is controlled.  Potassium is 4.6 today.  Magnesium of 1.7.  Cardiology input is appreciated.  Cardizem changed to oral.  Hopefully, patient will be discharged in the next 24 to 48 hours.  09/26/2022: Patient continues to improve.  No new labs today.  Confusion also seems to be improving.  Patient be discharged with skilled nursing facility for short-term rehab.  09/27/2022: Patient seen.  No new changes.  Memory remains significantly impaired.  Awaiting disposition.  Continue to monitor and replete electrolytes.  Nursing staff report dysuria.  Will proceed with urinalysis.  09/28/2022: Patient's blood pressure was reported to be dropping.  Minimize antihypertensives.  Decrease Coreg from 25 Mg p.o. twice daily to 12.5 Mg p.o. twice daily.  Continue to monitor the heart rate.  Do not give antihypertensive for SBP less than 110 mmHg.  Decrease Bumex from 3 Mg p.o. twice daily to 1 Mg  p.o. twice daily.  Volume overload has improved significantly.  Will renal function is normal.  Cardiology input is appreciated.  Cardiology team has been involved in patient's new onset A-fib with RVR.  Further management will depend on hospital course.  Renal panel and magnesium ordered.  Patient reported having diarrhea stools.  09/29/2022: Patient seen.  Diarrhea has improved.  Pursue  disposition.  Assessment & Plan:   Principal Problem:   Hypertensive crisis Active Problems:   Disorientation  Acute Encephalopathy 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  -Transfer to [facility], neurology consulted for likely LTM EEG -seizure precautions, prn Ativan -IR consult in the AM for LP -continue Acyclovir -loaded with Keppra, prn ativan -seizure precautions -no history of ETOH use -check b12 level 09/21/2022: See above documentation.  CSF analysis is nonrevealing.  Antibiotics and acyclovir have been discontinued.  Neurology input is appreciated.  LTM EEG did not reveal any definite seizure.  Patient is still on antiseizure medications.  Patient remains confused. 09/22/2022: Patient remains significantly confused. 09/24/2022: Slowly improving. 09/25/2022: Encephalopathy has resolved significantly. 09/29/2022: Memory deficit persists..  A-fib with RVR: -Start Cardizem drip. -Start Eliquis. -Low threshold to reconsult cardiology team. -Low threshold to repeat echocardiogram. -Normal TSH. -D-dimer was minimally elevated. -CTA chest is negative for pulmonary embolism. 09/25/2022: Heart rate is controlled.  Cardizem is being transitioned to oral.  Cardiology is directing. 09/28/2022: Heart rate is controlled.  Echocardiogram revealed:   1. Left ventricular ejection fraction, by estimation, is 50 to 55%. The  left ventricle has low normal function. Left ventricular endocardial  border not optimally defined to evaluate regional wall motion. Left  ventricular diastolic parameters are  consistent with Grade II diastolic dysfunction (pseudonormalization).  Elevated left ventricular end-diastolic pressure.   2. Right ventricular systolic function is normal. The right ventricular  size is normal.   3. Left atrial size was mildly dilated.   4. The mitral  valve is degenerative. Trivial mitral valve regurgitation.  No evidence of mitral stenosis.   5. The aortic valve is tricuspid. Aortic valve regurgitation is not  visualized. Aortic valve sclerosis/calcification is present, without any  evidence of aortic stenosis. Aortic valve area, by VTI measures 1.23 cm.  Aortic valve mean gradient measures  4.5 mmHg. Aortic valve Vmax measures 1.53 m/s.    Hypertensive Emergency HFpEF HL     Hypomagnesemia Continue to monitor and replete. Magnesium is 2.1 today.  Hypophosphatemia: -Continue to monitor and replete. -Phosphorus is 3.8 today.  Hypothyroidism -continue synthroid and check TSH 09/21/2022: TSH was 1.056.  Hypoalbuminemia: -This may have prognostic significance. -Cannot rule out moderate malnutrition/PEM.     Type 2 DM -SSI     Chronic lymphedema Chronic pain   DVT prophylaxis: Eliquis Code Status: Full code Family Communication:  Disposition Plan: This will depend on hospital course   Consultants:  Neurology  Procedures:  LTM EEG  Antimicrobials:  None   Subjective: Patient reports having diarrhea stools.  Objective: Vitals:   09/28/22 2013 09/29/22 0112 09/29/22 0605 09/29/22 1300  BP: 98/83 (!) 111/57 (!) 144/76 110/85  Pulse: 70  73 84  Resp: 18 15 19 16   Temp: 97.6 F (36.4 C) 97.8 F (36.6 C) 97.6 F (36.4 C) (!) 97.5 F (36.4 C)  TempSrc: Oral Oral Oral Oral  SpO2: 99% 99% 95% 96%  Weight:      Height:  Intake/Output Summary (Last 24 hours) at 09/29/2022 1610 Last data filed at 09/29/2022 0900 Gross per 24 hour  Intake 385.66 ml  Output 1300 ml  Net -914.34 ml    Filed Weights   09/19/22 0600 09/20/22 0500 09/21/22 0000  Weight: 85.7 kg 87.3 kg 93.3 kg    Examination:  General exam: Appears calm and comfortable.  Remains confused Respiratory system: Clear to auscultation.   Cardiovascular system: S1 & S2 heard  Gastrointestinal system: Abdomen is obese, soft and  non tender. . Central nervous system: Patient moves all extremities.  Patient is awake and alert.  Memory problems persist.  Data Reviewed: I have personally reviewed following labs and imaging studies  CBC: Recent Labs  Lab 09/24/22 0221 09/25/22 1102 09/27/22 0647  WBC 4.5 4.1 5.2  NEUTROABS 3.1  --  3.2  HGB 12.6* 13.2 13.3  HCT 37.9* 39.3 39.9  MCV 82.6 82.6 83.3  PLT 171 175 371    Basic Metabolic Panel: Recent Labs  Lab 09/23/22 0546 09/23/22 1511 09/24/22 0221 09/25/22 1102 09/27/22 0647 09/28/22 1016 09/29/22 1249  NA 139 136 138 139 139 133* 134*  K 3.9 3.8 3.3* 4.6 4.3 4.0 4.8  CL 104 102 103 104 99 95* 97*  CO2 25 28 25 28 27 26  32  GLUCOSE 111* 166* 153* 184* 180* 209* 179*  BUN <5* 5* <5* 5* 11 13 15   CREATININE 0.78 0.85 0.69 0.94 0.90 1.09 1.21  CALCIUM 8.0* 7.8* 8.0* 8.3* 8.5* 8.3* 9.0  MG 1.8  --  1.5* 1.7 1.6* 1.7 2.1  PHOS 1.4* 3.5  --   --  4.0 4.4 3.8    GFR: Estimated Creatinine Clearance: 49.2 mL/min (by C-G formula based on SCr of 1.21 mg/dL). Liver Function Tests: Recent Labs  Lab 09/23/22 0546 09/24/22 0221 09/27/22 0647 09/28/22 1016 09/29/22 1249  AST  --  19  --   --   --   ALT  --  20  --   --   --   ALKPHOS  --  58  --   --   --   BILITOT  --  0.7  --   --   --   PROT  --  4.9*  --   --   --   ALBUMIN 2.7* 2.6* 2.7* 2.8* 2.9*    No results for input(s): "LIPASE", "AMYLASE" in the last 168 hours. No results for input(s): "AMMONIA" in the last 168 hours.  Coagulation Profile: No results for input(s): "INR", "PROTIME" in the last 168 hours. Cardiac Enzymes: No results for input(s): "CKTOTAL", "CKMB", "CKMBINDEX", "TROPONINI" in the last 168 hours. BNP (last 3 results) No results for input(s): "PROBNP" in the last 8760 hours. HbA1C: No results for input(s): "HGBA1C" in the last 72 hours. CBG: Recent Labs  Lab 09/28/22 1637 09/28/22 2206 09/29/22 0740 09/29/22 0937 09/29/22 1228  GLUCAP 182* 181* 140* 164* 183*     Lipid Profile: No results for input(s): "CHOL", "HDL", "LDLCALC", "TRIG", "CHOLHDL", "LDLDIRECT" in the last 72 hours. Thyroid Function Tests: No results for input(s): "TSH", "T4TOTAL", "FREET4", "T3FREE", "THYROIDAB" in the last 72 hours.  Anemia Panel: No results for input(s): "VITAMINB12", "FOLATE", "FERRITIN", "TIBC", "IRON", "RETICCTPCT" in the last 72 hours.  Urine analysis:    Component Value Date/Time   COLORURINE YELLOW 09/28/2022 0008   APPEARANCEUR CLOUDY (A) 09/28/2022 0008   APPEARANCEUR Clear 12/21/2021 1054   LABSPEC 1.008 09/28/2022 0008   PHURINE 6.0 09/28/2022 0008   GLUCOSEU NEGATIVE  09/28/2022 0008   HGBUR NEGATIVE 09/28/2022 0008   BILIRUBINUR NEGATIVE 09/28/2022 0008   BILIRUBINUR negative 04/25/2022 0857   BILIRUBINUR Negative 12/21/2021 1054   KETONESUR NEGATIVE 09/28/2022 0008   PROTEINUR NEGATIVE 09/28/2022 0008   UROBILINOGEN 0.2 04/25/2022 0857   UROBILINOGEN 0.2 08/24/2013 1129   NITRITE NEGATIVE 09/28/2022 0008   LEUKOCYTESUR TRACE (A) 09/28/2022 0008   Sepsis Labs: @LABRCNTIP (procalcitonin:4,lacticidven:4)  ) Recent Results (from the past 240 hour(s))  CSF culture w Gram Stain     Status: None   Collection Time: 09/20/22  1:28 PM   Specimen: PATH Cytology CSF; Cerebrospinal Fluid  Result Value Ref Range Status   Specimen Description CSF  Final   Special Requests NONE  Final   Gram Stain   Final    WBC PRESENT, PREDOMINANTLY MONONUCLEAR NO ORGANISMS SEEN    Culture   Final    NO GROWTH 3 DAYS Performed at [facility], [place] [address], [place], [place] [address]    Report Status 09/23/2022 FINAL  Final         Radiology Studies: No results found.      Scheduled Meds:  apixaban  5 mg Oral BID   aspirin  81 mg Oral Daily   bumetanide  1 mg Oral BID   carvedilol  12.5 mg Oral BID WC   gabapentin  400 mg Oral TID   insulin aspart  0-15 Units Subcutaneous TID WC   insulin aspart  0-5 Units Subcutaneous QHS    insulin detemir  10 Units Subcutaneous QHS   levETIRAcetam  1,000 mg Oral BID   levothyroxine  88 mcg Oral Q0600   multivitamin with minerals  1 tablet Oral Daily   pantoprazole  40 mg Oral Daily   potassium & sodium phosphates  1 packet Oral TID WC & HS   pravastatin  40 mg Oral Daily   sacubitril-valsartan  1 tablet Oral BID   terbinafine  250 mg Oral Daily   vitamin B-12  1,500 mcg Oral Daily   Continuous Infusions:  sodium chloride 10 mL/hr at 09/28/22 1854     LOS: 11 days    Time spent: 35 minutes.    Dana Allan, MD  [facility] Pager #: [phone] 7PM-7AM contact night coverage as above

## 2022-09-30 DIAGNOSIS — I169 Hypertensive crisis, unspecified: Secondary | ICD-10-CM | POA: Diagnosis not present

## 2022-09-30 LAB — GLUCOSE, CAPILLARY
Glucose-Capillary: 206 mg/dL — ABNORMAL HIGH (ref 70–99)
Glucose-Capillary: 179 mg/dL — ABNORMAL HIGH (ref 70–99)
Glucose-Capillary: 239 mg/dL — ABNORMAL HIGH (ref 70–99)
Glucose-Capillary: 225 mg/dL — ABNORMAL HIGH (ref 70–99)
Glucose-Capillary: 137 mg/dL — ABNORMAL HIGH (ref 70–99)

## 2022-09-30 NOTE — Progress Notes (Signed)
Pt woke up this am confused and pulled off telemetry, gown, male purwick, an pulled IV out. Pt initially refusing to let NT replace telemetry and clean him up, pt stated he wanted to put his pajamas on and go home. I entered room and pt reoriented easily, allowed [place] to clean him up replace purwick and telemetry, however pt refused to have IV/NSL replaced at this time. Pt states he is tired of being stuck and wants to talk to his doctor this am. Will continue to monitor. Jessie Foot, RN

## 2022-09-30 NOTE — TOC Progression Note (Signed)
Transition of Care [facility]) - Progression Note    Patient Details  Name: Michael [name] MRN: [id number] Date of Birth: [birth year]  Transition of Care [facility]) CM/SW [place], [place] Phone Number: 09/30/2022, 11:37 AM  Clinical Narrative:     Patient has SNF bed at [facility] on 1/2. Insurance authorization has been approved through 1/2. CSW will continue to follow and assist with patients dc planning needs.   Expected Discharge Plan: [place] Barriers to Discharge: Continued Medical Work up, Ship broker, SNF Pending bed offer  Expected Discharge Plan and Services In-house Referral: Clinical Social Work   Post Acute Care Choice: [place] Living arrangements for the past 2 months: [place] Determinants of Health (SDOH) Interventions SDOH Screenings   Food Insecurity: No Food Insecurity (09/19/2022)  Housing: Low Risk  (09/19/2022)  Transportation Needs: No Transportation Needs (09/19/2022)  Utilities: Not At Risk (09/19/2022)  Alcohol Screen: Low Risk  (04/18/2022)  Depression (PHQ2-9): Low Risk  (06/20/2022)  Financial Resource Strain: Low Risk  (04/18/2022)  Physical Activity: Inactive (04/18/2022)  Social Connections: Moderately Isolated (04/18/2022)  Stress: No Stress Concern Present (04/18/2022)  Tobacco Use: Medium Risk (09/18/2022)    Readmission Risk Interventions     No data to display

## 2022-09-30 NOTE — Progress Notes (Signed)
PROGRESS NOTE    Michael [name]  DZH:[id number] DOB: 1946/04/27 DOA: 09/18/2022 PCP: Lindell Spar, MD  Outpatient Specialists:     Brief Narrative:  Patient is a 77 year old male with past medical history significant for HFpEF, COPD, HL, DM, NASH, chronic lymphedema who presented to [facility] with AMS.  He had a fall four days prior to admission and was evaluated in the ED with negative head CT.  Home health went to check on patient and found him confused and called EMS.  He was initially hypertensive with BP 220/92.  While in the ED pt had a tonic-clonic seizure which ceased with Ativan.  Head CT again negative.  An MRI brain was obtained showing equivocal/subtle DWI hyperintensity within the left hippocampus could be consistent with encephalitis, no evidence of PRES.   Neurology was consulted, findings could be seen in the setting of HSV encephalitis or post-ictally in the absence  of infection.   An LP was attempted, but not successful given history of surgery at L4-L5.   Neurology recommended transfer to [facility] for IR to perform LP and for continuous EEG.  Initial labs were significant for Mag 1.2, no leukocytosis and normal renal function.  He was loaded with Keppra and given Acyclovir and transferred to [facility].  CSF fluid analysis reveals 0 WBC.  Antibiotics and acyclovir have been discontinued.  Neurology team has signed off.  Patient remains significantly confused versus significant memory loss.  During the hospital stay, patient went into A-fib with RVR.  Cardiology team was consulted.  Heart rate is currently controlled.  Patient developed A-fib RVR later in the day.  Patient has been optimized.  Pursue disposition.  Patient would likely need short-term SNF.  Assessment & Plan:   Principal Problem:   Hypertensive crisis Active Problems:   Disorientation  Acute Encephalopathy New onset seizure activity -CT head came back negative, MRI with subtle hyperintensity which could  signify post-ictal change vs encephalitis Concern for possible HSV encephalitis  -Transferred to [facility], neurology consulted for likely LTM EEG -seizure precautions, prn Ativan -IR consult in the AM for LP -Initially on acyclovir and Keppra. -Acyclovir has been discontinued.  CSF analysis is nonrevealing.   -Neurology input is appreciated.  LTM EEG did not reveal any definite seizure.  Patient is still on antiseizure medications.     A-fib with RVR: -Initially managed with Cardizem drip. -Patient was started on Eliquis. -Cardiology team directed care. -Heart rate is controlled.   -Normal TSH. -D-dimer was minimally elevated. -CTA chest was negative for pulmonary embolism.   Echocardiogram revealed:   1. Left ventricular ejection fraction, by estimation, is 50 to 55%. The  left ventricle has low normal function. Left ventricular endocardial  border not optimally defined to evaluate regional wall motion. Left  ventricular diastolic parameters are  consistent with Grade II diastolic dysfunction (pseudonormalization).  Elevated left ventricular end-diastolic pressure.   2. Right ventricular systolic function is normal. The right ventricular  size is normal.   3. Left atrial size was mildly dilated.   4. The mitral valve is degenerative. Trivial mitral valve regurgitation.  No evidence of mitral stenosis.   5. The aortic valve is tricuspid. Aortic valve regurgitation is not  visualized. Aortic valve sclerosis/calcification is present, without any  evidence of aortic stenosis. Aortic valve area, by VTI measures 1.23 cm.  Aortic valve mean gradient measures  4.5 mmHg. Aortic valve Vmax measures 1.53 m/s.    Hypertensive Emergency HFpEF HL  Hypomagnesemia Continue to monitor and replete.  Hypophosphatemia: -Continue to monitor and replete.  Hypothyroidism -continue synthroid and check TSH 09/21/2022: TSH was 1.056.  Hypoalbuminemia: -This may have prognostic  significance. -Cannot rule out moderate malnutrition/PEM.     Type 2 DM -SSI     Chronic lymphedema Chronic pain   DVT prophylaxis: Eliquis Code Status: Full code Family Communication:  Disposition Plan: This will depend on hospital course   Consultants:  Neurology  Procedures:  LTM EEG  Antimicrobials:  None   Subjective: No new complaints. Patient denies diarrhea.  Objective: Vitals:   09/30/22 0353 09/30/22 0831 09/30/22 1209 09/30/22 1636  BP: 112/73 109/66 90/60 (!) 122/96  Pulse: 71 82 72 66  Resp: '14 13 14   '$ Temp: 98.6 F (37 C)  97.6 F (36.4 C)   TempSrc: Oral  Oral   SpO2: 100% 100% 97%   Weight:      Height:        Intake/Output Summary (Last 24 hours) at 09/30/2022 1944 Last data filed at 09/30/2022 0355 Gross per 24 hour  Intake --  Output 500 ml  Net -500 ml    Filed Weights   09/19/22 0600 09/20/22 0500 09/21/22 0000  Weight: 85.7 kg 87.3 kg 93.3 kg    Examination:  General exam: Appears calm and comfortable.  Remains confused Respiratory system: Clear to auscultation.   Cardiovascular system: S1 & S2 heard  Gastrointestinal system: Abdomen is obese, soft and non tender. . Central nervous system: Patient moves all extremities.  Patient is awake and alert.  Memory problems persist.  Data Reviewed: I have personally reviewed following labs and imaging studies  CBC: Recent Labs  Lab 09/24/22 0221 09/25/22 1102 09/27/22 0647  WBC 4.5 4.1 5.2  NEUTROABS 3.1  --  3.2  HGB 12.6* 13.2 13.3  HCT 37.9* 39.3 39.9  MCV 82.6 82.6 83.3  PLT 171 175 785    Basic Metabolic Panel: Recent Labs  Lab 09/24/22 0221 09/25/22 1102 09/27/22 0647 09/28/22 1016 09/29/22 1249  NA 138 139 139 133* 134*  K 3.3* 4.6 4.3 4.0 4.8  CL 103 104 99 95* 97*  CO2 '25 28 27 26 '$ 32  GLUCOSE 153* 184* 180* 209* 179*  BUN <5* 5* '11 13 15  '$ CREATININE 0.69 0.94 0.90 1.09 1.21  CALCIUM 8.0* 8.3* 8.5* 8.3* 9.0  MG 1.5* 1.7 1.6* 1.7 2.1  PHOS  --   --   4.0 4.4 3.8    GFR: Estimated Creatinine Clearance: 49.2 mL/min (by C-G formula based on SCr of 1.21 mg/dL). Liver Function Tests: Recent Labs  Lab 09/24/22 0221 09/27/22 0647 09/28/22 1016 09/29/22 1249  AST 19  --   --   --   ALT 20  --   --   --   ALKPHOS 58  --   --   --   BILITOT 0.7  --   --   --   PROT 4.9*  --   --   --   ALBUMIN 2.6* 2.7* 2.8* 2.9*    No results for input(s): "LIPASE", "AMYLASE" in the last 168 hours. No results for input(s): "AMMONIA" in the last 168 hours.  Coagulation Profile: No results for input(s): "INR", "PROTIME" in the last 168 hours. Cardiac Enzymes: No results for input(s): "CKTOTAL", "CKMB", "CKMBINDEX", "TROPONINI" in the last 168 hours. BNP (last 3 results) No results for input(s): "PROBNP" in the last 8760 hours. HbA1C: No results for input(s): "HGBA1C" in the last 72 hours.  CBG: Recent Labs  Lab 09/29/22 2122 09/30/22 0345 09/30/22 0803 09/30/22 1207 09/30/22 1659  GLUCAP 217* 206* 179* 239* 225*    Lipid Profile: No results for input(s): "CHOL", "HDL", "LDLCALC", "TRIG", "CHOLHDL", "LDLDIRECT" in the last 72 hours. Thyroid Function Tests: No results for input(s): "TSH", "T4TOTAL", "FREET4", "T3FREE", "THYROIDAB" in the last 72 hours.  Anemia Panel: No results for input(s): "VITAMINB12", "FOLATE", "FERRITIN", "TIBC", "IRON", "RETICCTPCT" in the last 72 hours.  Urine analysis:    Component Value Date/Time   COLORURINE YELLOW 09/28/2022 0008   APPEARANCEUR CLOUDY (A) 09/28/2022 0008   APPEARANCEUR Clear 12/21/2021 1054   LABSPEC 1.008 09/28/2022 0008   PHURINE 6.0 09/28/2022 0008   GLUCOSEU NEGATIVE 09/28/2022 0008   HGBUR NEGATIVE 09/28/2022 0008   BILIRUBINUR NEGATIVE 09/28/2022 0008   BILIRUBINUR negative 04/25/2022 0857   BILIRUBINUR Negative 12/21/2021 1054   KETONESUR NEGATIVE 09/28/2022 0008   PROTEINUR NEGATIVE 09/28/2022 0008   UROBILINOGEN 0.2 04/25/2022 0857   UROBILINOGEN 0.2 08/24/2013 1129    NITRITE NEGATIVE 09/28/2022 0008   LEUKOCYTESUR TRACE (A) 09/28/2022 0008   Sepsis Labs: '@LABRCNTIP'$ (procalcitonin:4,lacticidven:4)  ) No results found for this or any previous visit (from the past 240 hour(s)).        Radiology Studies: No results found.      Scheduled Meds:  apixaban  5 mg Oral BID   aspirin  81 mg Oral Daily   bumetanide  1 mg Oral BID   carvedilol  12.5 mg Oral BID WC   gabapentin  400 mg Oral TID   insulin aspart  0-15 Units Subcutaneous TID WC   insulin aspart  0-5 Units Subcutaneous QHS   insulin detemir  10 Units Subcutaneous QHS   levETIRAcetam  1,000 mg Oral BID   levothyroxine  88 mcg Oral Q0600   multivitamin with minerals  1 tablet Oral Daily   pantoprazole  40 mg Oral Daily   potassium & sodium phosphates  1 packet Oral TID WC & HS   pravastatin  40 mg Oral Daily   sacubitril-valsartan  1 tablet Oral BID   terbinafine  250 mg Oral Daily   vitamin B-12  1,500 mcg Oral Daily   Continuous Infusions:  sodium chloride 10 mL/hr at 09/28/22 1854     LOS: 12 days    Time spent: 35 minutes.    Dana Allan, MD  [facility] Pager #: [phone] 7PM-7AM contact night coverage as above

## 2022-10-01 DIAGNOSIS — R300 Dysuria: Secondary | ICD-10-CM | POA: Diagnosis not present

## 2022-10-01 DIAGNOSIS — N390 Urinary tract infection, site not specified: Secondary | ICD-10-CM | POA: Diagnosis not present

## 2022-10-01 DIAGNOSIS — I5042 Chronic combined systolic (congestive) and diastolic (congestive) heart failure: Secondary | ICD-10-CM | POA: Diagnosis not present

## 2022-10-01 DIAGNOSIS — R410 Disorientation, unspecified: Secondary | ICD-10-CM | POA: Diagnosis not present

## 2022-10-01 DIAGNOSIS — I872 Venous insufficiency (chronic) (peripheral): Secondary | ICD-10-CM | POA: Diagnosis not present

## 2022-10-01 DIAGNOSIS — E1140 Type 2 diabetes mellitus with diabetic neuropathy, unspecified: Secondary | ICD-10-CM | POA: Diagnosis not present

## 2022-10-01 DIAGNOSIS — R5381 Other malaise: Secondary | ICD-10-CM | POA: Diagnosis not present

## 2022-10-01 DIAGNOSIS — Z794 Long term (current) use of insulin: Secondary | ICD-10-CM | POA: Diagnosis not present

## 2022-10-01 DIAGNOSIS — G9340 Encephalopathy, unspecified: Secondary | ICD-10-CM | POA: Diagnosis not present

## 2022-10-01 DIAGNOSIS — M6281 Muscle weakness (generalized): Secondary | ICD-10-CM | POA: Diagnosis not present

## 2022-10-01 DIAGNOSIS — D649 Anemia, unspecified: Secondary | ICD-10-CM | POA: Diagnosis not present

## 2022-10-01 DIAGNOSIS — E785 Hyperlipidemia, unspecified: Secondary | ICD-10-CM | POA: Diagnosis not present

## 2022-10-01 DIAGNOSIS — M6259 Muscle wasting and atrophy, not elsewhere classified, multiple sites: Secondary | ICD-10-CM | POA: Diagnosis not present

## 2022-10-01 DIAGNOSIS — R2681 Unsteadiness on feet: Secondary | ICD-10-CM | POA: Diagnosis not present

## 2022-10-01 DIAGNOSIS — G40409 Other generalized epilepsy and epileptic syndromes, not intractable, without status epilepticus: Secondary | ICD-10-CM | POA: Insufficient documentation

## 2022-10-01 DIAGNOSIS — L89301 Pressure ulcer of unspecified buttock, stage 1: Secondary | ICD-10-CM

## 2022-10-01 DIAGNOSIS — I480 Paroxysmal atrial fibrillation: Secondary | ICD-10-CM | POA: Diagnosis not present

## 2022-10-01 DIAGNOSIS — J449 Chronic obstructive pulmonary disease, unspecified: Secondary | ICD-10-CM

## 2022-10-01 DIAGNOSIS — Z743 Need for continuous supervision: Secondary | ICD-10-CM | POA: Diagnosis not present

## 2022-10-01 DIAGNOSIS — E119 Type 2 diabetes mellitus without complications: Secondary | ICD-10-CM

## 2022-10-01 DIAGNOSIS — Z741 Need for assistance with personal care: Secondary | ICD-10-CM | POA: Diagnosis not present

## 2022-10-01 DIAGNOSIS — I169 Hypertensive crisis, unspecified: Secondary | ICD-10-CM | POA: Diagnosis not present

## 2022-10-01 DIAGNOSIS — Z7401 Bed confinement status: Secondary | ICD-10-CM | POA: Diagnosis not present

## 2022-10-01 DIAGNOSIS — I959 Hypotension, unspecified: Secondary | ICD-10-CM | POA: Diagnosis not present

## 2022-10-01 DIAGNOSIS — K7581 Nonalcoholic steatohepatitis (NASH): Secondary | ICD-10-CM

## 2022-10-01 DIAGNOSIS — G629 Polyneuropathy, unspecified: Secondary | ICD-10-CM | POA: Diagnosis not present

## 2022-10-01 DIAGNOSIS — R531 Weakness: Secondary | ICD-10-CM | POA: Diagnosis not present

## 2022-10-01 DIAGNOSIS — I10 Essential (primary) hypertension: Secondary | ICD-10-CM | POA: Diagnosis not present

## 2022-10-01 DIAGNOSIS — L8990 Pressure ulcer of unspecified site, unspecified stage: Secondary | ICD-10-CM | POA: Insufficient documentation

## 2022-10-01 DIAGNOSIS — G9341 Metabolic encephalopathy: Secondary | ICD-10-CM | POA: Diagnosis not present

## 2022-10-01 LAB — GLUCOSE, CAPILLARY
Glucose-Capillary: 157 mg/dL — ABNORMAL HIGH (ref 70–99)
Glucose-Capillary: 184 mg/dL — ABNORMAL HIGH (ref 70–99)
Glucose-Capillary: 161 mg/dL — ABNORMAL HIGH (ref 70–99)

## 2022-10-01 MED ORDER — CARVEDILOL 25 MG PO TABS: 25.0000 mg | ORAL_TABLET | Freq: Two times a day (BID) | ORAL | 0 refills | Status: DC

## 2022-10-01 MED ORDER — POLYETHYLENE GLYCOL 3350 17 GM/SCOOP PO POWD: 17.0000 g | Freq: Two times a day (BID) | ORAL | 2 refills | Status: DC | PRN

## 2022-10-01 MED ORDER — ACETAMINOPHEN 325 MG PO TABS: 650.0000 mg | ORAL_TABLET | Freq: Four times a day (QID) | ORAL | 2 refills | Status: AC | PRN

## 2022-10-01 MED ORDER — LEVETIRACETAM 1000 MG PO TABS: 1000.0000 mg | ORAL_TABLET | Freq: Two times a day (BID) | ORAL | Status: DC

## 2022-10-01 MED ORDER — APIXABAN 5 MG PO TABS: 5.0000 mg | ORAL_TABLET | Freq: Two times a day (BID) | ORAL | Status: DC

## 2022-10-01 MED ORDER — ADULT MULTIVITAMIN W/MINERALS CH: 1.0000 | ORAL_TABLET | Freq: Every day | ORAL | Status: DC

## 2022-10-01 MED ORDER — TRAZODONE HCL 50 MG PO TABS: 50.0000 mg | ORAL_TABLET | Freq: Every day | ORAL | Status: AC

## 2022-10-01 MED ORDER — CYANOCOBALAMIN 500 MCG PO TABS: 1500.0000 ug | ORAL_TABLET | Freq: Every day | ORAL | Status: DC

## 2022-10-01 MED ORDER — SENNOSIDES-DOCUSATE SODIUM 8.6-50 MG PO TABS: 1.0000 | ORAL_TABLET | Freq: Two times a day (BID) | ORAL | 0 refills | Status: DC | PRN

## 2022-10-01 NOTE — Care Management Important Message (Signed)
Important Message  Patient Details  Name: Michael [name] MRN: [id number] Date of Birth: [birth year]   Medicare Important Message Given:  Yes     Shelda Altes 10/01/2022, 12:18 PM

## 2022-10-01 NOTE — Progress Notes (Signed)
Called [facility] and gave report to Bjorn Pippin, RN.

## 2022-10-01 NOTE — Discharge Summary (Signed)
Physician Discharge Summary  Michael [name]:[id number] DOB: 1945-12-17 DOA: 09/18/2022  PCP: Lindell Spar, MD  Admit date: 09/18/2022 Discharge date: 10/01/2022 Admitted From: Home Disposition: SNF Recommendations for Outpatient Follow-up:  Follow up with cardiology and neurology as below Check fluid status, BP, CMP and CBC in 1 week Please follow up on the following pending results: None   Discharge Condition: Stable CODE STATUS: Full code  Contact information for follow-up providers     [facility]. Schedule an appointment as soon as possible for a visit in 6 week(s).   Specialty: Neurology Contact information: [address] [place]. Schedule an appointment as soon as possible for a visit in 2 week(s).   Contact information: [address], [address] [place] [phone]             Contact information for after-discharge care     Destination     [facility] Preferred SNF .   Service: Skilled Nursing Contact information: [address] [facility] 77 year old M with PMH of diastolic CHF, COPD, Hodgkin's lymphoma, IDDM-2, NASH, chronic lymphedema who presented to [facility] ED with AMS.  He had a fall four days prior to admission and was evaluated in the ED with negative head CT.  Home health went to check on patient and found him confused and called EMS.  He was initially hypertensive with BP 220/92.  While in the ED pt had a tonic-clonic seizure which ceased with Ativan.  Head CT again negative.  An MRI brain was obtained showing equivocal/subtle DWI hyperintensity within the left hippocampus could be consistent with encephalitis, no evidence of PRES.   Neurology was consulted, and felt MRI finding could be seen in the setting of  HSV encephalitis or post-ictally in the absence  of infection.   An LP was attempted, but not successful given history of surgery at L4-L5.   Neurology recommended transfer to [facility] for IR to perform LP and for continuous EEG.  Initial labs were significant for Mag 1.2, no leukocytosis and normal renal function.  He was loaded with Keppra and given Acyclovir and transferred to [facility].  He underwent lumbar puncture.  CSF fluid analysis reveals 0 WBC.  Antibiotics and acyclovir discontinued.  Neurology team recommended Keppra 1 g twice daily and signed off.    During the hospital stay, patient went into A-fib with RVR.  Cardiology team was consulted and recommended increasing his Coreg to 25 mg twice daily and increasing his Bumex to home dose and started p.o. Eliquis for anticoagulation.   Noted that patient is on terbinafine for about 6 weeks now.  Recommend monitoring LFT if he needs to continue.   Therapy recommended SNF.  See individual problem list below for more.   Problems addressed during this hospitalization Principal Problem:   Hypertensive crisis Active Problems:   NASH (nonalcoholic steatohepatitis)   Chronic combined systolic and diastolic heart failure (HCC)   Chronic obstructive lung disease (HCC)   Insulin dependent type 2 diabetes mellitus (HCC)   Physical deconditioning   Disorientation   Tonic-clonic seizure (HCC)   Pressure injury of skin   Paroxysmal atrial fibrillation with RVR (HCC)   Venous stasis dermatitis Hypokalemia/hypomagnesemia/hypophosphatemia  Pressure  skin injury: POA Pressure Injury 09/20/22 Anus Medial Stage 1 -  Intact skin with non-blanchable redness of a localized area usually over a bony prominence. (Active)  09/20/22 2300  Location: Anus  Location Orientation: Medial  Staging: Stage 1 -  Intact skin with non-blanchable redness of a localized area usually over a bony prominence.  Wound Description (Comments):   Present on Admission:  Yes  Dressing Type None 10/01/22 0100    Vital signs Vitals:   09/30/22 2052 10/01/22 0339 10/01/22 0734 10/01/22 0908  BP: (!) 106/95 126/72 (!) 143/84 (!) 147/72  Pulse: 66 72 78 82  Temp: 97.6 F (36.4 C) 98.1 F (36.7 C) 98.1 F (36.7 C)   Resp: '16 18 16   '$ Height:      Weight:      SpO2: 97% 95% 97%   TempSrc: Oral Oral Oral   BMI (Calculated):         Discharge exam  GENERAL: No apparent distress.  Nontoxic. HEENT: MMM.  Vision and hearing grossly intact.  NECK: Supple.  No apparent JVD.  RESP:  No IWOB.  Fair aeration bilaterally. CVS:  RRR. Heart sounds normal.  ABD/GI/GU: BS+. Abd soft, NTND.  MSK/EXT:  Moves extremities. No apparent deformity. No edema.  SKIN: Venous stasis dermatitis NEURO: Awake and alert. Oriented fairly.  No apparent focal neuro deficit. PSYCH: Calm. Normal affect.   Discharge Instructions  Allergies as of 10/01/2022   No Known Allergies      Medication List     STOP taking these medications    Baclofen 5 MG Tabs   clotrimazole-betamethasone cream Commonly known as: LOTRISONE   diclofenac sodium 1 % Gel Commonly known as: Voltaren   HYDROcodone-acetaminophen 10-325 MG tablet Commonly known as: NORCO   insulin glargine 100 UNIT/ML Solostar Pen Commonly known as: Lantus SoloStar   meloxicam 7.5 MG tablet Commonly known as: MOBIC   oxyCODONE-acetaminophen 7.5-325 MG tablet Commonly known as: PERCOCET       TAKE these medications    acetaminophen 325 MG tablet Commonly known as: Tylenol Take 2 tablets (650 mg total) by mouth every 6 (six) hours as needed.   allopurinol 100 MG tablet Commonly known as: ZYLOPRIM Take 100 mg by mouth daily.   apixaban 5 MG Tabs tablet Commonly known as: ELIQUIS Take 1 tablet (5 mg total) by mouth 2 (two) times daily.   aspirin EC 81 MG tablet Take 81 mg by mouth daily.   bumetanide 2 MG tablet Commonly known as: Bumex Take 1.5 tablets (3 mg total) by mouth 2 (two) times  daily.   carvedilol 25 MG tablet Commonly known as: COREG Take 1 tablet (25 mg total) by mouth 2 (two) times daily with a meal. What changed:  medication strength how much to take when to take this   cyanocobalamin 500 MCG tablet Commonly known as: VITAMIN B12 Take 3 tablets (1,500 mcg total) by mouth daily. Start taking on: October 02, 2022   Entresto 49-51 MG Generic drug: sacubitril-valsartan Take 1 tablet by mouth 2 (two) times daily.   gabapentin 400 MG capsule Commonly known as: NEURONTIN Take 1 capsule (400 mg total) by mouth 3 (three) times daily.   insulin glargine-yfgn 100 UNIT/ML Pen Commonly known as: SEMGLEE INJECT 20 UNITS SUBCUTANEOUSLY AT BEDTIME FOR DIABETES PNP HANDS AND FEET (CONVERTED FROM LANTUS)   Iron 325 (65 Fe) MG Tabs Take 325 mg by mouth daily.   levETIRAcetam 1000 MG tablet Commonly known as: KEPPRA Take 1 tablet (  1,000 mg total) by mouth 2 (two) times daily.   levothyroxine 100 MCG tablet Commonly known as: SYNTHROID Take 100 mcg by mouth daily before breakfast.   loperamide 2 MG tablet Commonly known as: IMODIUM A-D Take 2 mg by mouth daily as needed for diarrhea or loose stools.   magnesium oxide 400 MG tablet Commonly known as: MAG-OX Take 1 tablet (400 mg total) by mouth 2 (two) times daily.   metFORMIN 1000 MG tablet Commonly known as: GLUCOPHAGE TAKE ONE TABLET BY MOUTH TWICE DAILY WITH A MEAL. What changed:  how much to take how to take this when to take this additional instructions   metolazone 2.5 MG tablet Commonly known as: ZAROXOLYN Two Times Weekly on Monday and Thursday What changed:  how much to take how to take this when to take this additional instructions   multivitamin with minerals Tabs tablet Take 1 tablet by mouth daily. Start taking on: October 02, 2022   nystatin cream Commonly known as: MYCOSTATIN APPLY TO AFFECTED AREA TWICE DAILY. What changed:  how much to take how to take this when to  take this reasons to take this additional instructions   pantoprazole 40 MG tablet Commonly known as: PROTONIX Take 1 tablet (40 mg total) by mouth daily.   polyethylene glycol powder 17 GM/SCOOP powder Commonly known as: MiraLax Take 17 g by mouth 2 (two) times daily as needed for moderate constipation or mild constipation.   potassium chloride 10 MEQ tablet Commonly known as: KLOR-CON Take 10 mEq by mouth 2 (two) times daily.   pravastatin 40 MG tablet Commonly known as: PRAVACHOL TAKE ONE TABLET BY MOUTH DAILY.   senna-docusate 8.6-50 MG tablet Commonly known as: Senokot-S Take 1 tablet by mouth 2 (two) times daily between meals as needed for mild constipation.   tamsulosin 0.4 MG Caps capsule Commonly known as: FLOMAX TAKE (1) CAPSULE BY MOUTH EVERY DAY. What changed: See the new instructions.   terbinafine 250 MG tablet Commonly known as: LAMISIL TAKE ONE TABLET BY MOUTH ONCE DAILY.   traZODone 50 MG tablet Commonly known as: DESYREL Take 1 tablet (50 mg total) by mouth at bedtime. TAKE 1 TABLET BY MOUTH AT BEDTIME AS NEEDED FOR SLEEP. What changed:  medication strength how much to take how to take this when to take this   triamcinolone cream 0.1 % Commonly known as: KENALOG Apply 1 Application topically 2 (two) times daily.   Vitamin D3 10 MCG (400 UNIT) tablet Take 400 Units by mouth daily.        Consultations: Cardiology Neurology  Procedures/Studies: Lumbar puncture   CT Angio Chest Pulmonary Embolism (PE) W or WO Contrast  Result Date: 09/23/2022 CLINICAL DATA:  Short of breath, suspected pulmonary embolus EXAM: CT ANGIOGRAPHY CHEST WITH CONTRAST TECHNIQUE: Multidetector CT imaging of the chest was performed using the standard protocol during bolus administration of intravenous contrast. Multiplanar CT image reconstructions and MIPs were obtained to evaluate the vascular anatomy. RADIATION DOSE REDUCTION: This exam was performed according to  the departmental dose-optimization program which includes automated exposure control, adjustment of the mA and/or kV according to patient size and/or use of iterative reconstruction technique. CONTRAST:  51m OMNIPAQUE IOHEXOL 350 MG/ML SOLN COMPARISON:  09/23/2022 FINDINGS: Cardiovascular: This is a technically adequate evaluation of the pulmonary vasculature. No filling defects or pulmonary emboli. Mild cardiomegaly without pericardial effusion. Atherosclerosis throughout the coronary vasculature greatest in the LAD and circumflex distributions. Normal caliber of the thoracic aorta. Atherosclerosis of the aortic  arch. Mediastinum/Nodes: No enlarged mediastinal, hilar, or axillary lymph nodes. Thyroid gland, trachea, and esophagus demonstrate no significant findings. Lungs/Pleura: There are trace bilateral pleural effusions. No acute airspace disease or pneumothorax. There is mild bilateral bronchial wall thickening greatest at the lung bases. The central airways are patent. Upper Abdomen: Nodular contour of the liver capsule may reflect underlying cirrhosis. No acute upper abdominal findings. Musculoskeletal: No acute or destructive bony lesions. Reconstructed images demonstrate no additional findings. Review of the MIP images confirms the above findings. IMPRESSION: 1. No evidence of pulmonary embolus. 2. Trace bilateral pleural effusions. 3. Mild bilateral bronchial wall thickening which could reflect reactive airway disease or bronchitis. No acute airspace disease. 4. Cirrhosis. 5. Aortic Atherosclerosis (ICD10-I70.0). Coronary artery atherosclerosis. Electronically Signed   By: Randa Ngo M.D.   On: 09/23/2022 19:19   ECHOCARDIOGRAM COMPLETE  Result Date: 09/23/2022    ECHOCARDIOGRAM REPORT   Patient Name:   MASATO PETTIE Date of Exam: 09/23/2022 Medical Rec #:  [id number]       Height:       59.0 in Accession #:    [id number]      Weight:       205.7 lb Date of Birth:  1946-05-19        BSA:           1.866 m Patient Age:    27 years        BP:           118/74 mmHg Patient Gender: M               HR:           79 bpm. Exam Location:  Inpatient Procedure: 2D Echo, Cardiac Doppler and Color Doppler Indications:    Atrial Fibrillation I48.91  History:        Patient has prior history of Echocardiogram examinations, most                 recent 10/25/2021. CHF, COPD; Risk Factors:Hypertension,                 Dyslipidemia and Diabetes.  Sonographer:    Darlina Sicilian RDCS Referring Phys: [id number] Ida  1. Left ventricular ejection fraction, by estimation, is 50 to 55%. The left ventricle has low normal function. Left ventricular endocardial border not optimally defined to evaluate regional wall motion. Left ventricular diastolic parameters are consistent with Grade II diastolic dysfunction (pseudonormalization). Elevated left ventricular end-diastolic pressure.  2. Right ventricular systolic function is normal. The right ventricular size is normal.  3. Left atrial size was mildly dilated.  4. The mitral valve is degenerative. Trivial mitral valve regurgitation. No evidence of mitral stenosis.  5. The aortic valve is tricuspid. Aortic valve regurgitation is not visualized. Aortic valve sclerosis/calcification is present, without any evidence of aortic stenosis. Aortic valve area, by VTI measures 1.23 cm. Aortic valve mean gradient measures 4.5 mmHg. Aortic valve Vmax measures 1.53 m/s. FINDINGS  Left Ventricle: Left ventricular ejection fraction, by estimation, is 50 to 55%. The left ventricle has low normal function. Left ventricular endocardial border not optimally defined to evaluate regional wall motion. The left ventricular internal cavity  size was normal in size. There is no left ventricular hypertrophy. Left ventricular diastolic parameters are consistent with Grade II diastolic dysfunction (pseudonormalization). Elevated left ventricular end-diastolic pressure. Right Ventricle: The  right ventricular size is normal. No increase in right ventricular wall thickness. Right ventricular systolic function is normal.  Left Atrium: Left atrial size was mildly dilated. Right Atrium: Right atrial size was normal in size. Pericardium: There is no evidence of pericardial effusion. Presence of epicardial fat layer. Mitral Valve: The mitral valve is degenerative in appearance. There is mild calcification of the mitral valve leaflet(s). Mild mitral annular calcification. Trivial mitral valve regurgitation. No evidence of mitral valve stenosis. Tricuspid Valve: The tricuspid valve is grossly normal. Tricuspid valve regurgitation is not demonstrated. No evidence of tricuspid stenosis. Aortic Valve: The aortic valve is tricuspid. Aortic valve regurgitation is not visualized. Aortic valve sclerosis/calcification is present, without any evidence of aortic stenosis. Aortic valve mean gradient measures 4.5 mmHg. Aortic valve peak gradient measures 9.4 mmHg. Aortic valve area, by VTI measures 1.23 cm. Pulmonic Valve: The pulmonic valve was grossly normal. Pulmonic valve regurgitation is not visualized. No evidence of pulmonic stenosis. Aorta: The aortic root and ascending aorta are structurally normal, with no evidence of dilitation. IAS/Shunts: No atrial level shunt detected by color flow Doppler.  LEFT VENTRICLE PLAX 2D LVIDd:         4.60 cm   Diastology LVIDs:         3.30 cm   LV e' medial:    6.00 cm/s LV PW:         1.10 cm   LV E/e' medial:  15.8 LV IVS:        1.00 cm   LV e' lateral:   8.52 cm/s LVOT diam:     1.90 cm   LV E/e' lateral: 11.1 LV SV:         33 LV SV Index:   18 LVOT Area:     2.84 cm  RIGHT VENTRICLE RV S prime:     14.90 cm/s TAPSE (M-mode): 1.8 cm LEFT ATRIUM             Index        RIGHT ATRIUM           Index LA diam:        3.60 cm 1.93 cm/m   RA Area:     13.30 cm LA Vol (A2C):   53.0 ml 28.40 ml/m  RA Volume:   27.20 ml  14.57 ml/m LA Vol (A4C):   61.3 ml 32.84 ml/m LA Biplane  Vol: 60.4 ml 32.36 ml/m  AORTIC VALVE AV Area (Vmax):    1.34 cm AV Area (Vmean):   1.19 cm AV Area (VTI):     1.23 cm AV Vmax:           153.00 cm/s AV Vmean:          100.050 cm/s AV VTI:            0.272 m AV Peak Grad:      9.4 mmHg AV Mean Grad:      4.5 mmHg LVOT Vmax:         72.20 cm/s LVOT Vmean:        42.100 cm/s LVOT VTI:          0.118 m LVOT/AV VTI ratio: 0.43  AORTA Ao Root diam: 3.30 cm Ao Asc diam:  3.50 cm MITRAL VALVE MV Area (PHT): 3.36 cm    SHUNTS MV Decel Time: 226 msec    Systemic VTI:  0.12 m MV E velocity: 94.60 cm/s  Systemic Diam: 1.90 cm MV A velocity: 74.55 cm/s MV E/A ratio:  1.27 Skeet Latch MD Electronically signed by Skeet Latch MD Signature Date/Time: 09/23/2022/6:05:10 PM  Final    DG CHEST PORT 1 VIEW  Result Date: 09/21/2022 CLINICAL DATA:  Short of breath. EXAM: PORTABLE CHEST 1 VIEW COMPARISON:  09/18/2022. FINDINGS: Cardiac silhouette is normal in size. Normal mediastinal and hilar contours. Lungs are clear.  No convincing pleural effusion.  No pneumothorax. Nasal/orogastric tube passes below the diaphragm. Skeletal structures are grossly intact. IMPRESSION: No active disease. Electronically Signed   By: Lajean Manes M.D.   On: 09/21/2022 11:09   DG FL GUIDED LUMBAR PUNCTURE  Result Date: 09/20/2022 CLINICAL DATA:  Provided history: Altered mental status. EXAM: DIAGNOSTIC LUMBAR PUNCTURE UNDER FLUOROSCOPIC GUIDANCE COMPARISON:  Lumbar spine CT 09/14/2022. FLUOROSCOPY: Fluoroscopy time: 42 seconds (6.50 mGy). PROCEDURE: Due to the patient's altered mental status, Hayley Boisseau, PA-C obtained informed consent from the patient's niece Danford Bad) prior to the procedure. This process included a discussion of procedural risks. The patient was positioned prone on the fluoroscopy table. An appropriate skin entry site was determined under fluoroscopy and marked. A time-out was performed. The operator donned sterile gloves and a mask. 1% lidocaine was  used for local anesthesia. Subsequently, lumbar puncture was performed at the L2-L3 level using a 20 gauge spinal needle with return of clear CSF. 10 mL of CSF were collected and sent for laboratory studies. The patient tolerated the procedure well, and no immediate post-procedure complication was apparent. The procedure was performed by Pasty Spillers, PA-C, and was supervised and interpreted by Dr. Kellie Simmering. IMPRESSION: 1. Technically successful fluoroscopically-guided L2-L3 lumbar puncture. 2. 10 mL of CSF collected for laboratory studies. 3. No immediate post-procedure complication. Electronically Signed   By: Kellie Simmering D.O.   On: 09/20/2022 14:13   [place] EKG SITE RITE  Result Date: 09/20/2022 If [facility] image not attached, placement could not be confirmed due to current cardiac rhythm.  DG Abd 1 View  Result Date: 09/19/2022 CLINICAL DATA:  Nasogastric tube placement. EXAM: ABDOMEN - 1 VIEW COMPARISON:  None Available. FINDINGS: The bowel gas pattern is normal. Distal tip of nasogastric tube is seen looped in distal stomach. No radio-opaque calculi or other significant radiographic abnormality are seen. IMPRESSION: Distal tip of nasogastric tube is seen looped in distal stomach. Electronically Signed   By: Marijo Conception M.D.   On: 09/19/2022 12:55   Overnight EEG with video  Result Date: 09/19/2022 Lora Havens, MD     09/20/2022  6:36 AM Patient Name: JANDIEL MAGALLANES MRN: [id number] Epilepsy Attending: Lora Havens Referring Physician/Provider: Greta Doom, MD Duration: 09/19/2022 0401 to 09/20/2022 0401 Patient history:  77 year old male with altered mental status and witnessed seizure. EEG to evaluate for seizure. Level of alertness:  lethargic AEDs during EEG study: LEV, PHT, GBP Technical aspects: This EEG study was done with scalp electrodes positioned according to the 10-20 International system of electrode placement. Electrical activity was reviewed with band  pass filter of 1-'70Hz'$ , sensitivity of 7 uV/mm, display speed of 25m/sec with a '60Hz'$  notched filter applied as appropriate. EEG data were recorded continuously and digitally stored.  Video monitoring was available and reviewed as appropriate. Description: EEG showed continuous generalized 3-6 hz theta-delta slowing.  Lateralized periodic discharges were noted in left hemisphere, maximal left posterior quadrant with overlying rhythmicity at 0.5-'1Hz'$ .  Hyperventilation and photic stimulation were not performed.   ABNORMALITY -Lateralized periodic discharges with overlying rhythmicity, left hemisphere, maximal posterior quadrant ( LPD+R) - Continuous slow, generalized IMPRESSION: This study showed evidence of epileptogenicity arising from left hemisphere, maximal left posterior  quadrant. This EEG pattern is on the ictal-interictal continuum with increased risk of seizures. Additionally there is moderate to severe diffuse encephalopathy, nonspecific etiology. Dr. Lorrin Goodell was notified. Lora Havens   MR Brain W and Wo Contrast  Addendum Date: 09/18/2022   ADDENDUM REPORT: 09/18/2022 16:16 ADDENDUM: On further review, there is equivocal/subtle DWI hyperintensity within the left hippocampus on this study as well as the prior same day MRI. No definite correlate edema on FLAIR. In this could potentially be artifactual; however, encephalitis (both autoimmune and viral) are differential considerations. Consider lumbar puncture for further evaluation. These findings were discussed with Dr. Roderic Palau via telephone at 4:14 p.m. Electronically Signed   By: Margaretha Sheffield M.D.   On: 09/18/2022 16:16   Result Date: 09/18/2022 CLINICAL DATA:  Seizure, new-onset, no history of trauma EXAM: MRI HEAD WITHOUT AND WITH CONTRAST TECHNIQUE: Multiplanar, multiecho pulse sequences of the brain and surrounding structures were obtained without and with intravenous contrast. CONTRAST:  27m GADAVIST GADOBUTROL 1 MMOL/ML IV SOLN  COMPARISON:  Same day MRI. FINDINGS: Motion limited study.  Within this limitation: Brain: No acute infarction, hemorrhage, hydrocephalus, extra-axial collection or mass lesion. No apparent enhancement on the postcontrast imaging. Vascular: Major arterial flow voids are maintained at the skull base. Skull and upper cervical spine: Normal marrow signal. Sinuses/Orbits: Largely clear sinuses.  No acute orbital findings. Other: No mastoid effusions IMPRESSION: 1. Motion limited study without evidence of acute intracranial abnormality. 2. No apparent enhancement on the postcontrast imaging. This could potentially due to extravasation. Electronically Signed: By: FMargaretha SheffieldM.D. On: 09/18/2022 15:57   MR BRAIN WO CONTRAST  Result Date: 09/18/2022 CLINICAL DATA:  Seizure, new-onset, no history of trauma EXAM: MRI HEAD WITHOUT CONTRAST TECHNIQUE: Multiplanar, multiecho pulse sequences of the brain and surrounding structures were obtained without intravenous contrast. COMPARISON:  CT head the same day. FINDINGS: Motion limited study.  Within this limitation: Brain: No acute infarction, hemorrhage, hydrocephalus, extra-axial collection or mass lesion. Vascular: Major arterial flow voids are maintained skull base. Skull and upper cervical spine: Normal marrow signal. Sinuses/Orbits: Largely clear sinuses.  No acute orbital findings. Other: No mastoid effusions. IMPRESSION: Motion limited study without definite evidence of acute intracranial abnormality. Electronically Signed   By: FMargaretha SheffieldM.D.   On: 09/18/2022 14:23   DG Chest Portable 1 View  Result Date: 09/18/2022 CLINICAL DATA:  Provided history: Hypoxia. EXAM: PORTABLE CHEST 1 VIEW COMPARISON:  Prior chest radiographs 09/20/2019 and earlier. FINDINGS: Shallow inspiration radiograph. Cardiomegaly. Central pulmonary vascular congestion with possible mild interstitial edema. Mild ill-defined opacity within the left lung base, with an appearance  favoring atelectasis. No evidence of pleural effusion or pneumothorax. Dextrocurvature and spondylosis of the thoracic spine. IMPRESSION: Shallow inspiration radiograph. Cardiomegaly. Central pulmonary vascular congestion with possible mild interstitial edema. Mild ill-defined opacity within the left lung base, with an appearance favoring atelectasis. Electronically Signed   By: KKellie SimmeringD.O.   On: 09/18/2022 12:07   CT Head Wo Contrast  Result Date: 09/18/2022 CLINICAL DATA:  Altered mental status and possible seizure. EXAM: CT HEAD WITHOUT CONTRAST TECHNIQUE: Contiguous axial images were obtained from the base of the skull through the vertex without intravenous contrast. RADIATION DOSE REDUCTION: This exam was performed according to the departmental dose-optimization program which includes automated exposure control, adjustment of the mA and/or kV according to patient size and/or use of iterative reconstruction technique. COMPARISON:  Four days ago FINDINGS: Brain: No evidence of acute infarction, hemorrhage, hydrocephalus, extra-axial collection or  mass lesion/mass effect. Vascular: No hyperdense vessel or unexpected calcification. Skull: Normal. Negative for fracture or focal lesion. Sinuses/Orbits: No acute finding. IMPRESSION: No acute or reversible finding. Electronically Signed   By: Jorje Guild M.D.   On: 09/18/2022 11:40   CT Lumbar Spine Wo Contrast  Result Date: 09/14/2022 CLINICAL DATA:  Pain after trauma EXAM: CT LUMBAR SPINE WITHOUT CONTRAST TECHNIQUE: Multidetector CT imaging of the lumbar spine was performed without intravenous contrast administration. Multiplanar CT image reconstructions were also generated. RADIATION DOSE REDUCTION: This exam was performed according to the departmental dose-optimization program which includes automated exposure control, adjustment of the mA and/or kV according to patient size and/or use of iterative reconstruction technique. COMPARISON:  None  Available. FINDINGS: Segmentation: 5 lumbar type vertebrae. Alignment: Normal. Vertebrae: No acute fracture or focal pathologic process. Paraspinal and other soft tissues: Negative. Disc levels: Multilevel degenerative disc disease and lower lumbar facet degenerative changes. IMPRESSION: 1. No acute fracture or traumatic malalignment. 2. Multilevel degenerative disc disease and lower lumbar facet degenerative changes. Electronically Signed   By: Dorise Bullion III M.D.   On: 09/14/2022 16:31   CT Thoracic Spine Wo Contrast  Result Date: 09/14/2022 CLINICAL DATA:  Pain after trauma EXAM: CT THORACIC SPINE WITHOUT CONTRAST TECHNIQUE: Multidetector CT images of the thoracic were obtained using the standard protocol without intravenous contrast. RADIATION DOSE REDUCTION: This exam was performed according to the departmental dose-optimization program which includes automated exposure control, adjustment of the mA and/or kV according to patient size and/or use of iterative reconstruction technique. COMPARISON:  None Available. FINDINGS: Alignment: Normal. Vertebrae: There is a fracture off the anterior superior endplate of T1 described on the cervical spine CT scan. No evidence of involvement of the posterior vertebral body or posterior elements. No other fractures are identified. Paraspinal and other soft tissues: Negative. Disc levels: Multilevel degenerative disc disease and facet degenerative changes. IMPRESSION: 1. There is a fracture off the anterior superior endplate of T1 described on the cervical spine CT scan. No evidence of involvement of the posterior vertebral body or posterior elements. This finding was already called to the patient's provider in the emergency room. 2. Multilevel degenerative disc disease and facet degenerative changes. Electronically Signed   By: Dorise Bullion III M.D.   On: 09/14/2022 16:19   CT Head Wo Contrast  Result Date: 09/14/2022 CLINICAL DATA:  Trauma.  Fall. EXAM: CT  HEAD WITHOUT CONTRAST CT CERVICAL SPINE WITHOUT CONTRAST TECHNIQUE: Multidetector CT imaging of the head and cervical spine was performed following the standard protocol without intravenous contrast. Multiplanar CT image reconstructions of the cervical spine were also generated. RADIATION DOSE REDUCTION: This exam was performed according to the departmental dose-optimization program which includes automated exposure control, adjustment of the mA and/or kV according to patient size and/or use of iterative reconstruction technique. COMPARISON:  CT scan of the brain April 11, 2020. Cervical spine x-rays July 19, 2021. FINDINGS: CT HEAD FINDINGS Brain: No evidence of acute infarction, hemorrhage, hydrocephalus, extra-axial collection or mass lesion/mass effect. Vascular: Calcified atherosclerotic changes are identified in the intracranial carotids. Skull: Normal. Negative for fracture or focal lesion. Sinuses/Orbits: Opacification of the inferior right maxillary sinus is identified. Mucosal thickening is identified in the left maxillary sinus. No air-fluid levels. Other: No other abnormalities. CT CERVICAL SPINE FINDINGS Alignment: There is anterolisthesis of C4 versus C5. This finding is stable since the scout film from the April 11, 2020 CT scan of the brain. No acute malalignment is identified. Skull base  and vertebrae: There is anterior wedging the T1 vertebral body with a lucency through the anterior superior endplate only well identified on the sagittal view. The remainder of the T1 vertebral body is intact no other evidence of fractures identified. Soft tissues and spinal canal: No prevertebral fluid or swelling. No visible canal hematoma. Disc levels: Multilevel degenerative disc disease with anterior and posterior osteophytes. Uncovertebral degenerative changes, particularly in the upper cervical spine. Upper chest: Negative. Other: No other abnormalities. IMPRESSION: 1. No acute intracranial abnormalities. 2.  There is anterior wedging of the T1 vertebral body with a lucency through the anterior superior endplate only well identified on the sagittal view. This is favored to represent an acute fracture through the anterior superior endplate of T1. The remainder of the T1 vertebral body maintains height and there is no evident posterior extension. Posterior elements do not appear to be involved. 3. No acute fracture or traumatic malalignment in the cervical spine. 4. Degenerative changes in the cervical spine. Findings were discussed with the patient's PA, Santiago Glad. Electronically Signed   By: Dorise Bullion III M.D.   On: 09/14/2022 16:09   CT Cervical Spine Wo Contrast  Result Date: 09/14/2022 CLINICAL DATA:  Trauma.  Fall. EXAM: CT HEAD WITHOUT CONTRAST CT CERVICAL SPINE WITHOUT CONTRAST TECHNIQUE: Multidetector CT imaging of the head and cervical spine was performed following the standard protocol without intravenous contrast. Multiplanar CT image reconstructions of the cervical spine were also generated. RADIATION DOSE REDUCTION: This exam was performed according to the departmental dose-optimization program which includes automated exposure control, adjustment of the mA and/or kV according to patient size and/or use of iterative reconstruction technique. COMPARISON:  CT scan of the brain April 11, 2020. Cervical spine x-rays July 19, 2021. FINDINGS: CT HEAD FINDINGS Brain: No evidence of acute infarction, hemorrhage, hydrocephalus, extra-axial collection or mass lesion/mass effect. Vascular: Calcified atherosclerotic changes are identified in the intracranial carotids. Skull: Normal. Negative for fracture or focal lesion. Sinuses/Orbits: Opacification of the inferior right maxillary sinus is identified. Mucosal thickening is identified in the left maxillary sinus. No air-fluid levels. Other: No other abnormalities. CT CERVICAL SPINE FINDINGS Alignment: There is anterolisthesis of C4 versus C5. This finding is  stable since the scout film from the April 11, 2020 CT scan of the brain. No acute malalignment is identified. Skull base and vertebrae: There is anterior wedging the T1 vertebral body with a lucency through the anterior superior endplate only well identified on the sagittal view. The remainder of the T1 vertebral body is intact no other evidence of fractures identified. Soft tissues and spinal canal: No prevertebral fluid or swelling. No visible canal hematoma. Disc levels: Multilevel degenerative disc disease with anterior and posterior osteophytes. Uncovertebral degenerative changes, particularly in the upper cervical spine. Upper chest: Negative. Other: No other abnormalities. IMPRESSION: 1. No acute intracranial abnormalities. 2. There is anterior wedging of the T1 vertebral body with a lucency through the anterior superior endplate only well identified on the sagittal view. This is favored to represent an acute fracture through the anterior superior endplate of T1. The remainder of the T1 vertebral body maintains height and there is no evident posterior extension. Posterior elements do not appear to be involved. 3. No acute fracture or traumatic malalignment in the cervical spine. 4. Degenerative changes in the cervical spine. Findings were discussed with the patient's PA, Santiago Glad. Electronically Signed   By: Dorise Bullion III M.D.   On: 09/14/2022 16:09       The results  of significant diagnostics from this hospitalization (including imaging, microbiology, ancillary and laboratory) are listed below for reference.     Microbiology: No results found for this or any previous visit (from the past 240 hour(s)).   Labs:  CBC: Recent Labs  Lab 09/25/22 1102 09/27/22 0647  WBC 4.1 5.2  NEUTROABS  --  3.2  HGB 13.2 13.3  HCT 39.3 39.9  MCV 82.6 83.3  PLT 175 191   BMP &GFR Recent Labs  Lab 09/25/22 1102 09/27/22 0647 09/28/22 1016 09/29/22 1249  NA 139 139 133* 134*  K 4.6 4.3 4.0 4.8   CL 104 99 95* 97*  CO2 '28 27 26 '$ 32  GLUCOSE 184* 180* 209* 179*  BUN 5* '11 13 15  '$ CREATININE 0.94 0.90 1.09 1.21  CALCIUM 8.3* 8.5* 8.3* 9.0  MG 1.7 1.6* 1.7 2.1  PHOS  --  4.0 4.4 3.8   Estimated Creatinine Clearance: 49.2 mL/min (by C-G formula based on SCr of 1.21 mg/dL). Liver & Pancreas: Recent Labs  Lab 09/27/22 0647 09/28/22 1016 09/29/22 1249  ALBUMIN 2.7* 2.8* 2.9*   No results for input(s): "LIPASE", "AMYLASE" in the last 168 hours. No results for input(s): "AMMONIA" in the last 168 hours. Diabetic: No results for input(s): "HGBA1C" in the last 72 hours. Recent Labs  Lab 09/30/22 1207 09/30/22 1659 09/30/22 2049 10/01/22 0731 10/01/22 1115  GLUCAP 239* 225* 137* 157* 184*   Cardiac Enzymes: No results for input(s): "CKTOTAL", "CKMB", "CKMBINDEX", "TROPONINI" in the last 168 hours. No results for input(s): "PROBNP" in the last 8760 hours. Coagulation Profile: No results for input(s): "INR", "PROTIME" in the last 168 hours. Thyroid Function Tests: No results for input(s): "TSH", "T4TOTAL", "FREET4", "T3FREE", "THYROIDAB" in the last 72 hours. Lipid Profile: No results for input(s): "CHOL", "HDL", "LDLCALC", "TRIG", "CHOLHDL", "LDLDIRECT" in the last 72 hours. Anemia Panel: No results for input(s): "VITAMINB12", "FOLATE", "FERRITIN", "TIBC", "IRON", "RETICCTPCT" in the last 72 hours. Urine analysis:    Component Value Date/Time   COLORURINE YELLOW 09/28/2022 0008   APPEARANCEUR CLOUDY (A) 09/28/2022 0008   APPEARANCEUR Clear 12/21/2021 1054   LABSPEC 1.008 09/28/2022 0008   PHURINE 6.0 09/28/2022 0008   GLUCOSEU NEGATIVE 09/28/2022 0008   HGBUR NEGATIVE 09/28/2022 0008   BILIRUBINUR NEGATIVE 09/28/2022 0008   BILIRUBINUR negative 04/25/2022 0857   BILIRUBINUR Negative 12/21/2021 1054   KETONESUR NEGATIVE 09/28/2022 0008   PROTEINUR NEGATIVE 09/28/2022 0008   UROBILINOGEN 0.2 04/25/2022 0857   UROBILINOGEN 0.2 08/24/2013 1129   NITRITE NEGATIVE  09/28/2022 0008   LEUKOCYTESUR TRACE (A) 09/28/2022 0008   Sepsis Labs: Invalid input(s): "PROCALCITONIN", "LACTICIDVEN"   SIGNED:  Mercy Riding, MD  [facility] 10/01/2022, 12:06 PM

## 2022-10-01 NOTE — TOC Transition Note (Addendum)
Transition of Care [facility]) - CM/SW Discharge Note   Patient Details  Name: Michael [name] MRN: [id number] Date of Birth: [birth year]  Transition of Care [facility]) CM/SW Contact:  Milas Gain, [place] Phone Number: 10/01/2022, 12:03 PM   Clinical Narrative:     Patient will DC to: [facility]  Anticipated DC date: 10/01/2022  Family notified: [name] (niece)  Transport by: Corey Harold  ?  Per MD patient ready for DC to [facility] . RN, patient, patient's family, and facility notified of DC. Discharge Summary sent to facility. RN given number for report tele# [phone] RM# [id number]. DC packet on chart. Ambulance transport requested for patient.  CSW signing off.   Final next level of care: Skilled Nursing Facility Barriers to Discharge: No Barriers Identified   Patient Goals and CMS Choice CMS Medicare.gov Compare Post Acute Care list provided to:: Patient Choice offered to / list presented to :  (Niece)  Discharge Placement                Patient chooses bed at: [facility] Patient to be transferred to facility by: [place] Name of family member notified: [name] Patient and family notified of of transfer: 10/01/22  Discharge Plan and Services Additional resources added to the After Visit Summary for   In-house Referral: Clinical Social Work   Post Acute Care Choice: [place]                               Social Determinants of Health (SDOH) Interventions SDOH Screenings   Food Insecurity: No Food Insecurity (09/19/2022)  Housing: Low Risk  (09/19/2022)  Transportation Needs: No Transportation Needs (09/19/2022)  Utilities: Not At Risk (09/19/2022)  Alcohol Screen: Low Risk  (04/18/2022)  Depression (PHQ2-9): Low Risk  (06/20/2022)  Financial Resource Strain: Low Risk  (04/18/2022)  Physical Activity: Inactive (04/18/2022)  Social Connections: Moderately Isolated (04/18/2022)  Stress: No Stress Concern Present  (04/18/2022)  Tobacco Use: Medium Risk (09/18/2022)     Readmission Risk Interventions     No data to display

## 2022-10-01 NOTE — TOC Progression Note (Signed)
Transition of Care [facility]) - Progression Note    Patient Details  Name: Michael [name] MRN: [id number] Date of Birth: 01-Jan-1946  Transition of Care [facility]) CM/SW [place] Phone Number: 10/01/2022, 10:44 AM  Clinical Narrative:     CSW spoke with Perrin Smack at [place] who confirmed if patient is medically ready can dc over today. CSW informed MD. Patients insurance authorization approved Plan Auth ID# [id number] [facility] ID# [id number]. Insurance authorization has been approved from 12/29-1/2. CSW will continue to follow and assist with patients dc planning needs.   Expected Discharge Plan: [place] Barriers to Discharge: Continued Medical Work up, Ship broker, SNF Pending bed offer  Expected Discharge Plan and Services In-house Referral: Clinical Social Work   Post Acute Care Choice: [place] Living arrangements for the past 2 months: [place] Determinants of Health (SDOH) Interventions SDOH Screenings   Food Insecurity: No Food Insecurity (09/19/2022)  Housing: Low Risk  (09/19/2022)  Transportation Needs: No Transportation Needs (09/19/2022)  Utilities: Not At Risk (09/19/2022)  Alcohol Screen: Low Risk  (04/18/2022)  Depression (PHQ2-9): Low Risk  (06/20/2022)  Financial Resource Strain: Low Risk  (04/18/2022)  Physical Activity: Inactive (04/18/2022)  Social Connections: Moderately Isolated (04/18/2022)  Stress: No Stress Concern Present (04/18/2022)  Tobacco Use: Medium Risk (09/18/2022)    Readmission Risk Interventions     No data to display

## 2022-10-02 DIAGNOSIS — K7581 Nonalcoholic steatohepatitis (NASH): Secondary | ICD-10-CM | POA: Diagnosis not present

## 2022-10-02 DIAGNOSIS — J449 Chronic obstructive pulmonary disease, unspecified: Secondary | ICD-10-CM | POA: Diagnosis not present

## 2022-10-02 DIAGNOSIS — G629 Polyneuropathy, unspecified: Secondary | ICD-10-CM | POA: Diagnosis not present

## 2022-10-02 DIAGNOSIS — D649 Anemia, unspecified: Secondary | ICD-10-CM | POA: Diagnosis not present

## 2022-10-02 DIAGNOSIS — I480 Paroxysmal atrial fibrillation: Secondary | ICD-10-CM | POA: Diagnosis not present

## 2022-10-02 DIAGNOSIS — G9340 Encephalopathy, unspecified: Secondary | ICD-10-CM | POA: Diagnosis not present

## 2022-10-02 DIAGNOSIS — I5042 Chronic combined systolic (congestive) and diastolic (congestive) heart failure: Secondary | ICD-10-CM | POA: Diagnosis not present

## 2022-10-02 DIAGNOSIS — E785 Hyperlipidemia, unspecified: Secondary | ICD-10-CM | POA: Diagnosis not present

## 2022-10-02 DIAGNOSIS — I872 Venous insufficiency (chronic) (peripheral): Secondary | ICD-10-CM | POA: Diagnosis not present

## 2022-10-02 DIAGNOSIS — I169 Hypertensive crisis, unspecified: Secondary | ICD-10-CM | POA: Diagnosis not present

## 2022-10-03 DIAGNOSIS — E1140 Type 2 diabetes mellitus with diabetic neuropathy, unspecified: Secondary | ICD-10-CM | POA: Diagnosis not present

## 2022-10-03 DIAGNOSIS — R300 Dysuria: Secondary | ICD-10-CM | POA: Diagnosis not present

## 2022-10-03 DIAGNOSIS — I10 Essential (primary) hypertension: Secondary | ICD-10-CM | POA: Diagnosis not present

## 2022-10-09 ENCOUNTER — Encounter (INDEPENDENT_AMBULATORY_CARE_PROVIDER_SITE_OTHER): Payer: Self-pay

## 2022-10-09 DIAGNOSIS — I480 Paroxysmal atrial fibrillation: Secondary | ICD-10-CM | POA: Diagnosis not present

## 2022-10-09 DIAGNOSIS — J449 Chronic obstructive pulmonary disease, unspecified: Secondary | ICD-10-CM | POA: Diagnosis not present

## 2022-10-09 DIAGNOSIS — K7581 Nonalcoholic steatohepatitis (NASH): Secondary | ICD-10-CM | POA: Diagnosis not present

## 2022-10-09 DIAGNOSIS — D649 Anemia, unspecified: Secondary | ICD-10-CM | POA: Diagnosis not present

## 2022-10-09 DIAGNOSIS — I872 Venous insufficiency (chronic) (peripheral): Secondary | ICD-10-CM | POA: Diagnosis not present

## 2022-10-09 DIAGNOSIS — I169 Hypertensive crisis, unspecified: Secondary | ICD-10-CM | POA: Diagnosis not present

## 2022-10-09 DIAGNOSIS — G9340 Encephalopathy, unspecified: Secondary | ICD-10-CM | POA: Diagnosis not present

## 2022-10-09 DIAGNOSIS — G629 Polyneuropathy, unspecified: Secondary | ICD-10-CM | POA: Diagnosis not present

## 2022-10-09 DIAGNOSIS — I5042 Chronic combined systolic (congestive) and diastolic (congestive) heart failure: Secondary | ICD-10-CM | POA: Diagnosis not present

## 2022-10-09 DIAGNOSIS — E785 Hyperlipidemia, unspecified: Secondary | ICD-10-CM | POA: Diagnosis not present

## 2022-10-10 ENCOUNTER — Telehealth: Payer: Self-pay | Admitting: Internal Medicine

## 2022-10-10 DIAGNOSIS — E1140 Type 2 diabetes mellitus with diabetic neuropathy, unspecified: Secondary | ICD-10-CM | POA: Diagnosis not present

## 2022-10-10 DIAGNOSIS — R300 Dysuria: Secondary | ICD-10-CM | POA: Diagnosis not present

## 2022-10-10 DIAGNOSIS — I10 Essential (primary) hypertension: Secondary | ICD-10-CM | POA: Diagnosis not present

## 2022-10-10 NOTE — Telephone Encounter (Signed)
Niece is going to make an appt to get forms filled out

## 2022-10-10 NOTE — Telephone Encounter (Signed)
Pt niece called stating that they are trying to get him into an assistant living and is need to explain to nurse about his condition & see if FL2 can be completed?

## 2022-10-11 ENCOUNTER — Other Ambulatory Visit: Payer: Self-pay | Admitting: *Deleted

## 2022-10-11 DIAGNOSIS — E1140 Type 2 diabetes mellitus with diabetic neuropathy, unspecified: Secondary | ICD-10-CM | POA: Diagnosis not present

## 2022-10-11 DIAGNOSIS — I959 Hypotension, unspecified: Secondary | ICD-10-CM | POA: Diagnosis not present

## 2022-10-11 NOTE — Patient Outreach (Signed)
Late entry for 10/10/22 Screening for potential [facility] care coordination services as benefit of insurance plan and Primary Care Provider with [facility] Primary.  Facility site visit to [place]. Collaboration with Bubba Hales, Education officer, museum. Anticipated transition plan is [facility] in [place], if able to accept. Transition plans pending. Niece Danford Bad is primary contact.   Will continue to follow.   Marthenia Rolling, MSN, RN,BSN [place] Acute Care Coordinator [phone] (Direct dial)

## 2022-10-14 ENCOUNTER — Telehealth: Payer: Medicare Other | Admitting: Internal Medicine

## 2022-10-14 DIAGNOSIS — I959 Hypotension, unspecified: Secondary | ICD-10-CM | POA: Diagnosis not present

## 2022-10-14 DIAGNOSIS — E1140 Type 2 diabetes mellitus with diabetic neuropathy, unspecified: Secondary | ICD-10-CM | POA: Diagnosis not present

## 2022-10-15 ENCOUNTER — Other Ambulatory Visit: Payer: Self-pay | Admitting: *Deleted

## 2022-10-15 ENCOUNTER — Ambulatory Visit: Payer: Self-pay | Admitting: *Deleted

## 2022-10-15 DIAGNOSIS — I959 Hypotension, unspecified: Secondary | ICD-10-CM | POA: Diagnosis not present

## 2022-10-15 NOTE — Patient Outreach (Signed)
[place] Coordinator follow up. Mr. Legrand resides in [place] skilled nursing facility. Screening for potential [facility] care coordination services as benefit of insurance plan and Primary Care Provider.  Update received from [place], [facility] skilled nursing facility social worker. Transition plan is for [facility] in [place]. Assessment by ALF was done yesterday. [place] Education officer, museum awaiting to hear what date  ALF can accept.   No identifiable [facility] care coordination needs at this time.    Marthenia Rolling, MSN, RN,BSN [place] Acute Care Coordinator [phone] (Direct dial)

## 2022-10-16 ENCOUNTER — Ambulatory Visit: Payer: No Typology Code available for payment source | Attending: Medical | Admitting: Medical

## 2022-10-16 ENCOUNTER — Encounter: Payer: Self-pay | Admitting: Medical

## 2022-10-16 VITALS — BP 106/60 | HR 77 | Ht 59.0 in | Wt 173.0 lb

## 2022-10-16 DIAGNOSIS — E785 Hyperlipidemia, unspecified: Secondary | ICD-10-CM | POA: Diagnosis not present

## 2022-10-16 DIAGNOSIS — J449 Chronic obstructive pulmonary disease, unspecified: Secondary | ICD-10-CM | POA: Diagnosis not present

## 2022-10-16 DIAGNOSIS — I169 Hypertensive crisis, unspecified: Secondary | ICD-10-CM | POA: Diagnosis not present

## 2022-10-16 DIAGNOSIS — Z79899 Other long term (current) drug therapy: Secondary | ICD-10-CM

## 2022-10-16 DIAGNOSIS — I5042 Chronic combined systolic (congestive) and diastolic (congestive) heart failure: Secondary | ICD-10-CM | POA: Diagnosis not present

## 2022-10-16 DIAGNOSIS — G4730 Sleep apnea, unspecified: Secondary | ICD-10-CM

## 2022-10-16 DIAGNOSIS — G9340 Encephalopathy, unspecified: Secondary | ICD-10-CM | POA: Diagnosis not present

## 2022-10-16 DIAGNOSIS — I2510 Atherosclerotic heart disease of native coronary artery without angina pectoris: Secondary | ICD-10-CM | POA: Diagnosis not present

## 2022-10-16 DIAGNOSIS — I480 Paroxysmal atrial fibrillation: Secondary | ICD-10-CM

## 2022-10-16 DIAGNOSIS — G629 Polyneuropathy, unspecified: Secondary | ICD-10-CM | POA: Diagnosis not present

## 2022-10-16 DIAGNOSIS — D649 Anemia, unspecified: Secondary | ICD-10-CM | POA: Diagnosis not present

## 2022-10-16 DIAGNOSIS — I10 Essential (primary) hypertension: Secondary | ICD-10-CM | POA: Diagnosis not present

## 2022-10-16 DIAGNOSIS — K7581 Nonalcoholic steatohepatitis (NASH): Secondary | ICD-10-CM | POA: Diagnosis not present

## 2022-10-16 DIAGNOSIS — I872 Venous insufficiency (chronic) (peripheral): Secondary | ICD-10-CM | POA: Diagnosis not present

## 2022-10-16 DIAGNOSIS — E119 Type 2 diabetes mellitus without complications: Secondary | ICD-10-CM | POA: Diagnosis not present

## 2022-10-16 NOTE — Patient Instructions (Signed)
Medication Instructions:  STOP Aspirin  Labwork: CBC,BMET  Testing/Procedures: None today  Follow-Up: 3 months  Any Other Special Instructions Will Be Listed Below (If Applicable).    You have been referred to Pulmonary. They will call you to schedule an appointment.   If you need a refill on your cardiac medications before your next appointment, please call your pharmacy.

## 2022-10-16 NOTE — Progress Notes (Signed)
Cardiology Office Note:    Date:  10/16/2022   ID:  Michael [name], DOB 1945/11/16, MRN [id number]  PCP:  Lindell Spar, MD  [facility] Cardiologist:  [name], MD  [place] Electrophysiologist:  None   Referring MD: Lindell Spar, MD   Chief Complaint: Hospital follow-up  History of Present Illness:    Michael [name] is a 77 y.o. male with a hx of heart failure with improved EF, COPD, hyperlipidemia, hypertension, type 2 diabetes, NASH, chronic lymphedema, A-fib who is being seen for hospital follow-up.  Patient was admitted in 2020 for worsening dyspnea.  Underwent echocardiogram 09/19/2019 showing EF less than 20% with regional wall motion abnormalities,(down from 50 to 55% in 2017).  He underwent right and left heart cath on September 22, 2019 showing normal LVEDP, mild nonobstructive CAD.  Patient was discharged on the losartan, Coreg, Lasix.  Later Lasix was transitioned to Bumex and losartan to [facility].  Repeat echocardiogram January 2023 showed EF 50 to 55% with regional wall motion abnormalities, grade 2 diastolic dysfunction.   Patient was admitted to the hospital in late December with altered mental status found to have severely elevated blood pressure, generalized tonic-clonic seizures.  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  Patient converted to A-fib with RVR treated with metoprolol later transitioned to carvedilol.  Echo showed LVEF 50-55%, G2DD, normal RVSF. After a lumbar puncture patient was started on Eliquis.  He went back in A-fib RVR and was started on IV diltiazem with conversion to NSR.  Patient was discharged to rehab facility.  Today, hospitalization was reviewed. He does not remember anything regarding the admission. He lives at home and has occasional help from a niece. He is always in a wheelchair. He denies chest pain or shortness of breath.  Patient plans on moving to a facility, where he can have help.  Patient has stable lymphedema.  EKG  shows normal sinus rhythm.  Past Medical History:  Diagnosis Date   Arthritis    Congestive heart disease ([place]) 11/2015   COPD (chronic obstructive pulmonary disease) (HCC)    DDD (degenerative disc disease), lumbosacral    Demand ischemia 09/23/2019   Depression    Diabetic neuropathy (HCC)    Diastolic dysfunction    Essential hypertension    GERD (gastroesophageal reflux disease)    Hyperlipidemia    Iron deficiency anemia    Left knee DJD    Lumbar spinal stenosis    Lymphedema 11/2015   BOTH LEGS   NASH (nonalcoholic steatohepatitis)    Peripheral edema    Peripheral edema 10/05/2015   Type 2 diabetes mellitus ([place])     Past Surgical History:  Procedure Laterality Date   BIOPSY N/A 07/21/2014   Procedure: BIOPSY;  Surgeon: Daneil Dolin, MD;  Location: [facility];  Service: Endoscopy;  Laterality: N/A;   CATARACT EXTRACTION W/PHACO Right 07/08/2016   Procedure: CATARACT EXTRACTION PHACO AND INTRAOCULAR LENS PLACEMENT RIGHT EYE;  Surgeon: Tonny Branch, MD;  Location: [facility];  Service: Ophthalmology;  Laterality: Right;  CDE: 9.01   CATARACT EXTRACTION W/PHACO Left 07/22/2016   Procedure: CATARACT EXTRACTION PHACO AND INTRAOCULAR LENS PLACEMENT LEFT EYE CDE=10.69;  Surgeon: Tonny Branch, MD;  Location: [facility];  Service: Ophthalmology;  Laterality: Left;  left -    COLONOSCOPY  2011   [facility]: normal colon, normal distal ileum   COLONOSCOPY WITH PROPOFOL N/A 07/21/2014   Procedure: ATTEMPTED COLONOSCOPY WITH PROPOFOL-HAD TO STOP DUE TO BRADYCARDIA;  Surgeon:  Daneil Dolin, MD;  Location: [facility];  Service: Endoscopy;  Laterality: N/A;   EGD with enteroscopy  2011   [facility]: normal esophagus and stomach. Normal duodenum, jejunum. No evidence of AVMs.    ESOPHAGOGASTRODUODENOSCOPY (EGD) WITH PROPOFOL N/A 07/21/2014   Procedure: ESOPHAGOGASTRODUODENOSCOPY (EGD) WITH PROPOFOL;  Surgeon: Daneil Dolin, MD;  Location: [facility];  Service: Endoscopy;  Laterality: N/A;   HERNIA REPAIR      KNEE ARTHROSCOPY WITH MEDIAL MENISECTOMY Left 11/06/2012   Procedure: KNEE ARTHROSCOPY WITH MEDIAL MENISECTOMY;  Surgeon: Carole Civil, MD;  Location: [facility];  Service: Orthopedics;  Laterality: Left;   Lipoma removal     Stomach   LUMBAR LAMINECTOMY/DECOMPRESSION MICRODISCECTOMY Left 08/30/2013   Procedure: LUMBAR LAMINECTOMY/DECOMPRESSION MICRODISCECTOMY LEFT  LUMBAR TWO THREE;  Surgeon: Otilio Connors, MD;  Location: [place] [facility];  Service: Neurosurgery;  Laterality: Left;   RIGHT/LEFT HEART CATH AND CORONARY ANGIOGRAPHY N/A 09/22/2019   Procedure: RIGHT/LEFT HEART CATH AND CORONARY ANGIOGRAPHY;  Surgeon: Martinique, Peter M, MD;  Location: [place] [facility];  Service: Cardiovascular;  Laterality: N/A;   SHOULDER SURGERY     Rght-rotator cuff   TOOTH EXTRACTION      Current Medications: Current Meds  Medication Sig   acetaminophen (TYLENOL) 325 MG tablet Take 2 tablets (650 mg total) by mouth every 6 (six) hours as needed.   allopurinol (ZYLOPRIM) 100 MG tablet Take 100 mg by mouth daily.   apixaban (ELIQUIS) 5 MG TABS tablet Take 1 tablet (5 mg total) by mouth 2 (two) times daily.   bumetanide (BUMEX) 2 MG tablet Take 1.5 tablets (3 mg total) by mouth 2 (two) times daily.   carvedilol (COREG) 25 MG tablet Take 1 tablet (25 mg total) by mouth 2 (two) times daily with a meal.   Cholecalciferol (VITAMIN D3) 10 MCG (400 UNIT) tablet Take 400 Units by mouth daily.   Ferrous Sulfate (IRON) 325 (65 FE) MG TABS Take 325 mg by mouth daily.   gabapentin (NEURONTIN) 400 MG capsule Take 1 capsule (400 mg total) by mouth 3 (three) times daily.   insulin glargine-yfgn (SEMGLEE) 100 UNIT/ML Pen    levETIRAcetam (KEPPRA) 1000 MG tablet Take 1 tablet (1,000 mg total) by mouth 2 (two) times daily.   levothyroxine (SYNTHROID) 100 MCG tablet Take 100 mcg by mouth daily before breakfast.   loperamide (IMODIUM A-D) 2 MG tablet Take 2 mg by mouth daily as needed for diarrhea or loose stools.   magnesium  oxide (MAG-OX) 400 MG tablet Take 1 tablet (400 mg total) by mouth 2 (two) times daily.   metFORMIN (GLUCOPHAGE) 1000 MG tablet TAKE ONE TABLET BY MOUTH TWICE DAILY WITH A MEAL. (Patient taking differently: Take 500 mg by mouth 2 (two) times daily with a meal.)   metolazone (ZAROXOLYN) 2.5 MG tablet Two Times Weekly on Monday and Thursday (Patient taking differently: Take 2.5 mg by mouth 2 (two) times a week. Monday and Thursday)   Multiple Vitamin (MULTIVITAMIN WITH MINERALS) TABS tablet Take 1 tablet by mouth daily.   nystatin cream (MYCOSTATIN) APPLY TO AFFECTED AREA TWICE DAILY. (Patient taking differently: Apply 1 Application topically 2 (two) times daily as needed (redness).)   pantoprazole (PROTONIX) 40 MG tablet Take 1 tablet (40 mg total) by mouth daily.   polyethylene glycol powder (MIRALAX) 17 GM/SCOOP powder Take 17 g by mouth 2 (two) times daily as needed for moderate constipation or mild constipation.   potassium chloride (KLOR-CON) 10 MEQ tablet Take 10  mEq by mouth 2 (two) times daily.   pravastatin (PRAVACHOL) 40 MG tablet TAKE ONE TABLET BY MOUTH DAILY. (Patient taking differently: Take 40 mg by mouth daily.)   sacubitril-valsartan (ENTRESTO) 49-51 MG Take 1 tablet by mouth 2 (two) times daily.   senna-docusate (SENOKOT-S) 8.6-50 MG tablet Take 1 tablet by mouth 2 (two) times daily between meals as needed for mild constipation.   tamsulosin (FLOMAX) 0.4 MG CAPS capsule TAKE (1) CAPSULE BY MOUTH EVERY DAY. (Patient taking differently: Take 0.4 mg by mouth daily.)   terbinafine (LAMISIL) 250 MG tablet TAKE ONE TABLET BY MOUTH ONCE DAILY.   traZODone (DESYREL) 50 MG tablet Take 1 tablet (50 mg total) by mouth at bedtime. TAKE 1 TABLET BY MOUTH AT BEDTIME AS NEEDED FOR SLEEP.   triamcinolone cream (KENALOG) 0.1 % Apply 1 Application topically 2 (two) times daily.   vitamin B-12 (VITAMIN B12) 500 MCG tablet Take 3 tablets (1,500 mcg total) by mouth daily.   [DISCONTINUED] aspirin EC 81  MG tablet Take 81 mg by mouth daily.     Allergies:   Patient has no known allergies.   Social History   Socioeconomic History   Marital status: Widowed    Spouse name: Not on file   Number of children: Not on file   Years of education: 12   Highest education level: Not on file  Occupational History   Not on file  Tobacco Use   Smoking status: Former    Packs/day: 2.50    Years: 44.00    Total pack years: 110.00    Types: Cigarettes    Start date: 05/07/1955    Quit date: 10/01/1999    Years since quitting: 23.0   Smokeless tobacco: Never  Vaping Use   Vaping Use: Never used  Substance and Sexual Activity   Alcohol use: No    Alcohol/week: 0.0 standard drinks of alcohol   Drug use: No   Sexual activity: Yes    Birth control/protection: None  Other Topics Concern   Not on file  Social History Narrative   Not on file   Social Determinants of Health   Financial Resource Strain: Low Risk  (04/18/2022)   Overall Financial Resource Strain (CARDIA)    Difficulty of Paying Living Expenses: Not hard at all  Food Insecurity: No Food Insecurity (09/19/2022)   Hunger Vital Sign    Worried About Running Out of Food in the Last Year: Never true    [place] in the Last Year: Never true  Transportation Needs: No Transportation Needs (09/19/2022)   PRAPARE - Hydrologist (Medical): No    Lack of Transportation (Non-Medical): No  Physical Activity: Inactive (04/18/2022)   Exercise Vital Sign    Days of Exercise per Week: 0 days    Minutes of Exercise per Session: 0 min  Stress: No Stress Concern Present (04/18/2022)   [place]    Feeling of Stress : Not at all  Social Connections: Moderately Isolated (04/18/2022)   Social Connection and Isolation Panel [NHANES]    Frequency of Communication with Friends and Family: More than three times a week    Frequency of Social Gatherings  with Friends and Family: More than three times a week    Attends Religious Services: 1 to 4 times per year    Active Member of [employer] or Organizations: No    Attends Archivist Meetings: Never  Marital Status: Widowed     Family History: The patient's family history includes Cancer in his father and mother; Depression in his mother; Diabetes in his brother, brother, father, mother, sister, and sister; Heart disease in his father, mother, sister, sister, and sister; Hyperlipidemia in his father, mother, sister, sister, and sister; Hypertension in his father, mother, sister, sister, and sister; Rectal cancer in his mother.  ROS:   Please see the history of present illness.     All other systems reviewed and are negative.  EKGs/Labs/Other Studies Reviewed:    The following studies were reviewed today:  Echo 08/2022 1. Left ventricular ejection fraction, by estimation, is 50 to 55%. The  left ventricle has low normal function. Left ventricular endocardial  border not optimally defined to evaluate regional wall motion. Left  ventricular diastolic parameters are  consistent with Grade II diastolic dysfunction (pseudonormalization).  Elevated left ventricular end-diastolic pressure.   2. Right ventricular systolic function is normal. The right ventricular  size is normal.   3. Left atrial size was mildly dilated.   4. The mitral valve is degenerative. Trivial mitral valve regurgitation.  No evidence of mitral stenosis.   5. The aortic valve is tricuspid. Aortic valve regurgitation is not  visualized. Aortic valve sclerosis/calcification is present, without any  evidence of aortic stenosis. Aortic valve area, by VTI measures 1.23 cm.  Aortic valve mean gradient measures  4.5 mmHg. Aortic valve Vmax measures 1.53 m/s.   Echo 09/2021  1. Left ventricular ejection fraction, by estimation, is 50 to 55%. The  left ventricle has low normal function. The left ventricle demonstrates   regional wall motion abnormalities (see scoring diagram/findings for  description). Left ventricular diastolic   parameters are consistent with Grade II diastolic dysfunction  (pseudonormalization). Elevated left ventricular end-diastolic pressure.   2. Right ventricular systolic function is normal. The right ventricular  size is normal. Tricuspid regurgitation signal is inadequate for assessing  PA pressure.   3. Left atrial size was moderately dilated.   4. The mitral valve is degenerative. Mild mitral valve regurgitation.   5. The aortic valve is tricuspid. There is mild calcification of the  aortic valve. Aortic valve regurgitation is trivial. Aortic valve  sclerosis/calcification is present, without any evidence of aortic  stenosis. Aortic valve mean gradient measures 6.0  mmHg.   6. The inferior vena cava is normal in size with greater than 50%  respiratory variability, suggesting right atrial pressure of 3 mmHg.   Comparison(s): Prior images reviewed side by side. There has been  significant improvement in LVEF since the prior study.   Cardiac cath 62/8315 LV end diastolic pressure is normal.   1. Mild nonobstructive CAD 2. Normal LV filling pressures 3. Normal right heart pressures. 4. Normal cardiac output. Index 2.7.     Plan: will switch IV lasix to po. Optimize CHF therapy. Would anticipate DC in am if stable.   EKG:  EKG is ordered today.  The ekg ordered today demonstrates sinus rhythm, 82 bpm, nonspecific T wave changes.  Recent Labs: 09/19/2022: TSH 1.056 09/24/2022: ALT 20 09/27/2022: Hemoglobin 13.3; Platelets 191 09/29/2022: BUN 15; Creatinine, Ser 1.21; Magnesium 2.1; Potassium 4.8; Sodium 134  Recent Lipid Panel    Component Value Date/Time   CHOL 128 06/20/2022 0953   TRIG 142 06/20/2022 0953   HDL 47 06/20/2022 0953   CHOLHDL 2.7 06/20/2022 0953   CHOLHDL 3.5 04/10/2020 1023   VLDL 22 04/10/2016 1019   LDLCALC 56 06/20/2022 0953  [place] 82  04/10/2020 1023    Physical Exam:    VS:  BP 106/60   Pulse 77   Ht '4\' 11"'$  (1.499 m)   Wt 173 lb (78.5 kg)   SpO2 99%   BMI 34.94 kg/m     Wt Readings from Last 3 Encounters:  10/16/22 173 lb (78.5 kg)  09/21/22 205 lb 11 oz (93.3 kg)  09/14/22 203 lb (92.1 kg)     GEN:  Well nourished, well developed in no acute distress HEENT: Normal NECK: No JVD; No carotid bruits LYMPHATICS: No lymphadenopathy CARDIAC: RRR, no murmurs, rubs, gallops RESPIRATORY:  Clear to auscultation without rales, wheezing or rhonchi  ABDOMEN: Soft, non-tender, non-distended MUSCULOSKELETAL:  minimal lower leg edema; No deformity  SKIN: Warm and dry NEUROLOGIC:  Alert and oriented x 3 PSYCHIATRIC:  Normal affect   ASSESSMENT:    1. Paroxysmal A-fib ([place])   2. Medication management   3. Sleep apnea, unspecified type   4. Coronary artery disease involving native coronary artery of native heart without angina pectoris    PLAN:    In order of problems listed above:  Paroxysmal Afib Recent hospitalization with multiple issues found to have A-fib RVR converted to sinus rhythm with rate control medication. He was started on Eliquis and Coreg. He does not remember anything during the hospitalization.  He is now at a rehab facility. Today patient is in normal sinus rhythm. Continue Eliquis 5 mg twice daily for stroke prophylaxis continue Coreg 25 mg twice daily. for rate control.  Check a CBC today.  Will refer him to pulmonology for possible sleep study.  Chronic Heart Failure with improved EF Echo during recent hospitalizations showed LVEF 50 to 12%, grade 2 diastolic dysfunction, normal RV SF, trivial MR.  Patient takes Bumex 3 mg twice daily and metolazone 2.5 two times weekly.  Continue Coreg and Entresto.  Patient has chronic lymphedema.  I will check a BMET today.  Nonobstructive CAD Non-obstructive CAD by cath in 2020.  Patient denies chest pain.  I will stop aspirin as he is on Eliquis.   Continue beta-blocker and statin therapy  Hypertension Blood pressure soft today. Continue Entresto and Coreg.  Disposition: Follow up in 3 month(s) with MD/APP    Signed, Teriana Danker Ninfa Meeker, PA-C  10/16/2022 3:00 PM    [facility]

## 2022-10-17 ENCOUNTER — Other Ambulatory Visit: Payer: Self-pay | Admitting: *Deleted

## 2022-10-17 DIAGNOSIS — E871 Hypo-osmolality and hyponatremia: Secondary | ICD-10-CM | POA: Diagnosis not present

## 2022-10-17 NOTE — Patient Outreach (Signed)
Michael [name] resides in [place] skilled nursing facility. Screening for potential [facility] care coordination services as benefit of insurance plan and Primary Care Provider.   Met with Cindie Crumbly social worker earlier today. Michael [name] will transition to [facility] in [place] on 10/21/22.  Marthenia Rolling, MSN, RN,BSN [place] Acute Care Coordinator [phone] (Direct dial)

## 2022-10-18 DIAGNOSIS — E871 Hypo-osmolality and hyponatremia: Secondary | ICD-10-CM | POA: Diagnosis not present

## 2022-10-18 DIAGNOSIS — I10 Essential (primary) hypertension: Secondary | ICD-10-CM | POA: Diagnosis not present

## 2022-10-21 DIAGNOSIS — I959 Hypotension, unspecified: Secondary | ICD-10-CM | POA: Diagnosis not present

## 2022-10-21 DIAGNOSIS — E871 Hypo-osmolality and hyponatremia: Secondary | ICD-10-CM | POA: Diagnosis not present

## 2022-10-22 ENCOUNTER — Telehealth: Payer: Medicare Other | Admitting: Internal Medicine

## 2022-10-22 DIAGNOSIS — M6259 Muscle wasting and atrophy, not elsewhere classified, multiple sites: Secondary | ICD-10-CM | POA: Diagnosis not present

## 2022-10-22 DIAGNOSIS — R2681 Unsteadiness on feet: Secondary | ICD-10-CM | POA: Diagnosis not present

## 2022-10-22 DIAGNOSIS — Z741 Need for assistance with personal care: Secondary | ICD-10-CM | POA: Diagnosis not present

## 2022-10-22 DIAGNOSIS — G9340 Encephalopathy, unspecified: Secondary | ICD-10-CM | POA: Diagnosis not present

## 2022-10-22 DIAGNOSIS — M6281 Muscle weakness (generalized): Secondary | ICD-10-CM | POA: Diagnosis not present

## 2022-10-28 DIAGNOSIS — J449 Chronic obstructive pulmonary disease, unspecified: Secondary | ICD-10-CM | POA: Diagnosis not present

## 2022-10-28 DIAGNOSIS — K219 Gastro-esophageal reflux disease without esophagitis: Secondary | ICD-10-CM | POA: Diagnosis not present

## 2022-10-28 DIAGNOSIS — I10 Essential (primary) hypertension: Secondary | ICD-10-CM | POA: Diagnosis not present

## 2022-10-28 DIAGNOSIS — I480 Paroxysmal atrial fibrillation: Secondary | ICD-10-CM | POA: Diagnosis not present

## 2022-10-28 DIAGNOSIS — I739 Peripheral vascular disease, unspecified: Secondary | ICD-10-CM | POA: Diagnosis not present

## 2022-10-28 DIAGNOSIS — I5042 Chronic combined systolic (congestive) and diastolic (congestive) heart failure: Secondary | ICD-10-CM | POA: Diagnosis not present

## 2022-10-28 DIAGNOSIS — I890 Lymphedema, not elsewhere classified: Secondary | ICD-10-CM | POA: Diagnosis not present

## 2022-10-28 DIAGNOSIS — M5136 Other intervertebral disc degeneration, lumbar region: Secondary | ICD-10-CM | POA: Diagnosis not present

## 2022-11-15 ENCOUNTER — Ambulatory Visit: Payer: Medicare Other | Attending: Cardiology | Admitting: Cardiology

## 2022-11-15 NOTE — Progress Notes (Deleted)
Clinical Summary Mr. Rouland is a 77 y.o.male  seen today for follow up of the following medical problems.      1. Chronic combined systolic/ diastolic heart failure - several year history of diastolic HF. New diagnosis of systolic HF during [id number] admission 08/2019 echo: <20%, akinesis distal 1/3 08/2019 cath mild CAD, normal filling pressures       -last seen in [facility] 01/2020 - no recent SOB/DOE - LE edema improving. He is on bumex 67m bid, metolazone 2.556mMo2024/04/06nd Thurs     Jan 2023 echo: LVEF 50-55%. Grade II dd - chronic leg edema is up and down.  - he is on bumex 70m41mid and metolazone 2.5mg56mn January 04, 2023 Thurs.  -3/204/06/23s K 4.7 Cr 0.81   -swelling overall stable.  - compliant with diuretic -some recent additinoal fluid loss with diarrhea, followed by pcp.   - has not taken meds yet today     2. Lymphedema - difficultly getting transporation, did not complete sessions   3. HTN - has not taken meds yet today  4.PAF    5. Seizures - admit Jan 2024 with AMS, seizure activity -      SH: veteran, he is a [place]. Served in the [employer] is [name] also a patient of mine, recently passed away around labor day 202006-Apr-2020m pancreatic cancer Past Medical History:  Diagnosis Date   Arthritis    Congestive heart disease (HCC)[place]/206-Apr-2017OPD (chronic obstructive pulmonary disease) (HCC)[place] DDD (degenerative disc disease), lumbosacral    Demand ischemia 09/23/2019   Depression    Diabetic neuropathy (HCC)    Diastolic dysfunction    Essential hypertension    GERD (gastroesophageal reflux disease)    Hyperlipidemia    Iron deficiency anemia    Left knee DJD    Lumbar spinal stenosis    Lymphedema 11/2015   BOTH LEGS   NASH (nonalcoholic steatohepatitis)    Peripheral edema    Peripheral edema 10/05/2015   Type 2 diabetes mellitus (HCC)      No Known Allergies   Current Outpatient Medications  Medication Sig Dispense Refill    acetaminophen (TYLENOL) 325 MG tablet Take 2 tablets (650 mg total) by mouth every 6 (six) hours as needed. 100 tablet 2   allopurinol (ZYLOPRIM) 100 MG tablet Take 100 mg by mouth daily.     apixaban (ELIQUIS) 5 MG TABS tablet Take 1 tablet (5 mg total) by mouth 2 (two) times daily. 60 tablet    bumetanide (BUMEX) 2 MG tablet Take 1.5 tablets (3 mg total) by mouth 2 (two) times daily. 270 tablet 3   carvedilol (COREG) 25 MG tablet Take 1 tablet (25 mg total) by mouth 2 (two) times daily with a meal. 180 tablet 0   Cholecalciferol (VITAMIN D3) 10 MCG (400 UNIT) tablet Take 400 Units by mouth daily.     Ferrous Sulfate (IRON) 325 (65 FE) MG TABS Take 325 mg by mouth daily.     gabapentin (NEURONTIN) 400 MG capsule Take 1 capsule (400 mg total) by mouth 3 (three) times daily. 90 capsule 0   insulin glargine-yfgn (SEMGLEE) 100 UNIT/ML Pen      levETIRAcetam (KEPPRA) 1000 MG tablet Take 1 tablet (1,000 mg total) by mouth 2 (two) times daily.     levothyroxine (SYNTHROID) 100 MCG tablet Take 100 mcg by mouth daily before breakfast.     loperamide (IMODIUM A-D)  2 MG tablet Take 2 mg by mouth daily as needed for diarrhea or loose stools.     magnesium oxide (MAG-OX) 400 MG tablet Take 1 tablet (400 mg total) by mouth 2 (two) times daily. 60 tablet 11   metFORMIN (GLUCOPHAGE) 1000 MG tablet TAKE ONE TABLET BY MOUTH TWICE DAILY WITH A MEAL. (Patient taking differently: Take 500 mg by mouth 2 (two) times daily with a meal.) 180 tablet 3   metolazone (ZAROXOLYN) 2.5 MG tablet Two Times Weekly on Monday and Thursday (Patient taking differently: Take 2.5 mg by mouth 2 (two) times a week. Monday and Thursday) 10 tablet 11   Multiple Vitamin (MULTIVITAMIN WITH MINERALS) TABS tablet Take 1 tablet by mouth daily.     nystatin cream (MYCOSTATIN) APPLY TO AFFECTED AREA TWICE DAILY. (Patient taking differently: Apply 1 Application topically 2 (two) times daily as needed (redness).) 30 g 0   pantoprazole (PROTONIX) 40  MG tablet Take 1 tablet (40 mg total) by mouth daily. 30 tablet 11   polyethylene glycol powder (MIRALAX) 17 GM/SCOOP powder Take 17 g by mouth 2 (two) times daily as needed for moderate constipation or mild constipation. 255 g 2   potassium chloride (KLOR-CON) 10 MEQ tablet Take 10 mEq by mouth 2 (two) times daily.     pravastatin (PRAVACHOL) 40 MG tablet TAKE ONE TABLET BY MOUTH DAILY. (Patient taking differently: Take 40 mg by mouth daily.) 90 tablet 0   sacubitril-valsartan (ENTRESTO) 49-51 MG Take 1 tablet by mouth 2 (two) times daily. 60 tablet 11   senna-docusate (SENOKOT-S) 8.6-50 MG tablet Take 1 tablet by mouth 2 (two) times daily between meals as needed for mild constipation. 60 tablet 0   tamsulosin (FLOMAX) 0.4 MG CAPS capsule TAKE (1) CAPSULE BY MOUTH EVERY DAY. (Patient taking differently: Take 0.4 mg by mouth daily.) 90 capsule 3   terbinafine (LAMISIL) 250 MG tablet TAKE ONE TABLET BY MOUTH ONCE DAILY. 30 tablet 0   traZODone (DESYREL) 50 MG tablet Take 1 tablet (50 mg total) by mouth at bedtime. TAKE 1 TABLET BY MOUTH AT BEDTIME AS NEEDED FOR SLEEP.     triamcinolone cream (KENALOG) 0.1 % Apply 1 Application topically 2 (two) times daily.     vitamin B-12 (VITAMIN B12) 500 MCG tablet Take 3 tablets (1,500 mcg total) by mouth daily.     No current facility-administered medications for this visit.     Past Surgical History:  Procedure Laterality Date   BIOPSY N/A 07/21/2014   Procedure: BIOPSY;  Surgeon: Daneil Dolin, MD;  Location: [facility];  Service: Endoscopy;  Laterality: N/A;   CATARACT EXTRACTION W/PHACO Right 07/08/2016   Procedure: CATARACT EXTRACTION PHACO AND INTRAOCULAR LENS PLACEMENT RIGHT EYE;  Surgeon: Tonny , MD;  Location: [facility];  Service: Ophthalmology;  Laterality: Right;  CDE: 9.01   CATARACT EXTRACTION W/PHACO Left 07/22/2016   Procedure: CATARACT EXTRACTION PHACO AND INTRAOCULAR LENS PLACEMENT LEFT EYE CDE=10.69;  Surgeon: Tonny , MD;  Location: [facility];  Service: Ophthalmology;  Laterality: Left;  left -    COLONOSCOPY  2011   [facility]: normal colon, normal distal ileum   COLONOSCOPY WITH PROPOFOL N/A 07/21/2014   Procedure: ATTEMPTED COLONOSCOPY WITH PROPOFOL-HAD TO STOP DUE TO BRADYCARDIA;  Surgeon: Daneil Dolin, MD;  Location: [facility];  Service: Endoscopy;  Laterality: N/A;   EGD with enteroscopy  2011   [facility]: normal esophagus and stomach. Normal duodenum, jejunum. No evidence of AVMs.    ESOPHAGOGASTRODUODENOSCOPY (EGD)  WITH PROPOFOL N/A 07/21/2014   Procedure: ESOPHAGOGASTRODUODENOSCOPY (EGD) WITH PROPOFOL;  Surgeon: Daneil Dolin, MD;  Location: [facility];  Service: Endoscopy;  Laterality: N/A;   HERNIA REPAIR     KNEE ARTHROSCOPY WITH MEDIAL MENISECTOMY Left 11/06/2012   Procedure: KNEE ARTHROSCOPY WITH MEDIAL MENISECTOMY;  Surgeon: Carole Civil, MD;  Location: [facility];  Service: Orthopedics;  Laterality: Left;   Lipoma removal     Stomach   LUMBAR LAMINECTOMY/DECOMPRESSION MICRODISCECTOMY Left 08/30/2013   Procedure: LUMBAR LAMINECTOMY/DECOMPRESSION MICRODISCECTOMY LEFT  LUMBAR TWO THREE;  Surgeon: Otilio Connors, MD;  Location: [facility];  Service: Neurosurgery;  Laterality: Left;   RIGHT/LEFT HEART CATH AND CORONARY ANGIOGRAPHY N/A 09/22/2019   Procedure: RIGHT/LEFT HEART CATH AND CORONARY ANGIOGRAPHY;  Surgeon: Martinique, Peter M, MD;  Location: [facility];  Service: Cardiovascular;  Laterality: N/A;   SHOULDER SURGERY     Rght-rotator cuff   TOOTH EXTRACTION       No Known Allergies    Family History  Problem Relation Age of Onset   Heart disease Mother    Hyperlipidemia Mother    Hypertension Mother    Depression Mother    Diabetes Mother    Rectal cancer Mother    Cancer Mother    Heart disease Father    Hypertension Father    Hyperlipidemia Father    Diabetes Father    Cancer Father    Heart disease Sister    Hyperlipidemia Sister    Hypertension Sister    Diabetes Sister    Diabetes  Brother    Heart disease Sister    Hyperlipidemia Sister    Hypertension Sister    Heart disease Sister    Hyperlipidemia Sister    Hypertension Sister    Diabetes Sister    Diabetes Brother      Social History Mr. Shiroma reports that he quit smoking about 23 years ago. His smoking use included cigarettes. He started smoking about 67 years ago. He has a 110.00 pack-year smoking history. He has never used smokeless tobacco. Mr. Gohn reports no history of alcohol use.   Review of Systems CONSTITUTIONAL: No weight loss, fever, chills, weakness or fatigue.  HEENT: Eyes: No visual loss, blurred vision, double vision or yellow sclerae.No hearing loss, sneezing, congestion, runny nose or sore throat.  SKIN: No rash or itching.  CARDIOVASCULAR:  RESPIRATORY: No shortness of breath, cough or sputum.  GASTROINTESTINAL: No anorexia, nausea, vomiting or diarrhea. No abdominal pain or blood.  GENITOURINARY: No burning on urination, no polyuria NEUROLOGICAL: No headache, dizziness, syncope, paralysis, ataxia, numbness or tingling in the extremities. No change in bowel or bladder control.  MUSCULOSKELETAL: No muscle, back pain, joint pain or stiffness.  LYMPHATICS: No enlarged nodes. No history of splenectomy.  PSYCHIATRIC: No history of depression or anxiety.  ENDOCRINOLOGIC: No reports of sweating, cold or heat intolerance. No polyuria or polydipsia.  Marland Kitchen   Physical Examination There were no vitals filed for this visit. There were no vitals filed for this visit.  Gen: resting comfortably, no acute distress HEENT: no scleral icterus, pupils equal round and reactive, no palptable cervical adenopathy,  CV Resp: Clear to auscultation bilaterally GI: abdomen is soft, non-tender, non-distended, normal bowel sounds, no hepatosplenomegaly MSK: extremities are warm, no edema.  Skin: warm, no rash Neuro:  no focal deficits Psych: appropriate affect   Diagnostic Studies 08/2019  echo IMPRESSIONS     1. Left ventricular ejection fraction, by visual estimation, is <20%. The  left ventricle has severely decreased function. There is no left  ventricular hypertrophy.   2. The left ventricle demonstrates regional wall motion abnormalities.   3. LVEF is approximately 15 to 20% with severe hypokinesis and akinesis  if the distal 1/3 of left ventricle.. Only extreme proximal portions of LV  thicken during systole. Compared to echo from 2017, LVEF is now severely  depressed.   4. Global right ventricle has normal systolic function.The right  ventricular size is normal. No increase in right ventricular wall  thickness.   5. Left atrial size was normal.   6. Right atrial size was normal.   7. Mild mitral annular calcification.   8. The mitral valve is abnormal. Trivial mitral valve regurgitation.   9. The tricuspid valve is normal in structure. Tricuspid valve  regurgitation is mild.  10. The aortic valve is abnormal. Aortic valve regurgitation is not  visualized. Mild aortic valve sclerosis without stenosis.  11. The pulmonic valve was normal in structure. Pulmonic valve  regurgitation is not visualized.  12. The inferior vena cava is dilated in size with <50% respiratory  variability, suggesting right atrial pressure of 15 mmHg.      08/2019 cath LV end diastolic pressure is normal.   1. Mild nonobstructive CAD 2. Normal LV filling pressures 3. Normal right heart pressures. 4. Normal cardiac output. Index 2.7.      Jan 2023 echo  1. Left ventricular ejection fraction, by estimation, is 50 to 55%. The  left ventricle has low normal function. The left ventricle demonstrates  regional wall motion abnormalities (see scoring diagram/findings for  description). Left ventricular diastolic   parameters are consistent with Grade II diastolic dysfunction  (pseudonormalization). Elevated left ventricular end-diastolic pressure.   2. Right ventricular systolic  function is normal. The right ventricular  size is normal. Tricuspid regurgitation signal is inadequate for assessing  PA pressure.   3. Left atrial size was moderately dilated.   4. The mitral valve is degenerative. Mild mitral valve regurgitation.   5. The aortic valve is tricuspid. There is mild calcification of the  aortic valve. Aortic valve regurgitation is trivial. Aortic valve  sclerosis/calcification is present, without any evidence of aortic  stenosis. Aortic valve mean gradient measures 6.0  mmHg.   6. The inferior vena cava is normal in size with greater than 50%  respiratory variability, suggesting right atrial pressure of 3 mmHg.      08/2022 echo IMPRESSIONS     1. Left ventricular ejection fraction, by estimation, is 50 to 55%. The  left ventricle has low normal function. Left ventricular endocardial  border not optimally defined to evaluate regional wall motion. Left  ventricular diastolic parameters are  consistent with Grade II diastolic dysfunction (pseudonormalization).  Elevated left ventricular end-diastolic pressure.   2. Right ventricular systolic function is normal. The right ventricular  size is normal.   3. Left atrial size was mildly dilated.   4. The mitral valve is degenerative. Trivial mitral valve regurgitation.  No evidence of mitral stenosis.   5. The aortic valve is tricuspid. Aortic valve regurgitation is not  visualized. Aortic valve sclerosis/calcification is present, without any  evidence of aortic stenosis. Aortic valve area, by VTI measures 1.23 cm.  Aortic valve mean gradient measures  4.5 mmHg. Aortic valve Vmax measures 1.53 m/s.      Assessment and Plan   1. Chronic systolic/diastolic HF - repeat echo shows LVEF has normalized, ongoing diastolic dysfunction - edema is overall stable,  continue current diuretics. With additional fluid loss with diarrhea we will recheck bmet/mg since on high dose diuretics.    2. HTN - mildly  elevated, from review essentailly runs 130s/80s. Has not taken meds yet today - continue current meds     [name], M.D., F.A.C.C.

## 2022-11-18 ENCOUNTER — Emergency Department (HOSPITAL_COMMUNITY): Payer: No Typology Code available for payment source

## 2022-11-18 ENCOUNTER — Other Ambulatory Visit: Payer: Self-pay

## 2022-11-18 ENCOUNTER — Inpatient Hospital Stay (HOSPITAL_COMMUNITY)
Admission: EM | Admit: 2022-11-18 | Discharge: 2022-11-21 | DRG: 871 | Disposition: A | Discharge status: Skilled Nursing Facility | Payer: No Typology Code available for payment source | Source: Skilled Nursing Facility | Attending: Internal Medicine | Admitting: Internal Medicine

## 2022-11-18 DIAGNOSIS — D62 Acute posthemorrhagic anemia: Secondary | ICD-10-CM | POA: Diagnosis present

## 2022-11-18 DIAGNOSIS — E861 Hypovolemia: Secondary | ICD-10-CM | POA: Diagnosis present

## 2022-11-18 DIAGNOSIS — Z794 Long term (current) use of insulin: Secondary | ICD-10-CM

## 2022-11-18 DIAGNOSIS — Z83438 Family history of other disorder of lipoprotein metabolism and other lipidemia: Secondary | ICD-10-CM

## 2022-11-18 DIAGNOSIS — I110 Hypertensive heart disease with heart failure: Secondary | ICD-10-CM | POA: Diagnosis present

## 2022-11-18 DIAGNOSIS — N390 Urinary tract infection, site not specified: Secondary | ICD-10-CM | POA: Diagnosis present

## 2022-11-18 DIAGNOSIS — R54 Age-related physical debility: Secondary | ICD-10-CM | POA: Diagnosis present

## 2022-11-18 DIAGNOSIS — Z7189 Other specified counseling: Secondary | ICD-10-CM | POA: Diagnosis not present

## 2022-11-18 DIAGNOSIS — B961 Klebsiella pneumoniae [K. pneumoniae] as the cause of diseases classified elsewhere: Secondary | ICD-10-CM | POA: Diagnosis present

## 2022-11-18 DIAGNOSIS — Z8249 Family history of ischemic heart disease and other diseases of the circulatory system: Secondary | ICD-10-CM | POA: Diagnosis not present

## 2022-11-18 DIAGNOSIS — E860 Dehydration: Secondary | ICD-10-CM | POA: Diagnosis present

## 2022-11-18 DIAGNOSIS — N179 Acute kidney failure, unspecified: Secondary | ICD-10-CM | POA: Diagnosis present

## 2022-11-18 DIAGNOSIS — N3001 Acute cystitis with hematuria: Secondary | ICD-10-CM

## 2022-11-18 DIAGNOSIS — Z9842 Cataract extraction status, left eye: Secondary | ICD-10-CM

## 2022-11-18 DIAGNOSIS — K7581 Nonalcoholic steatohepatitis (NASH): Secondary | ICD-10-CM | POA: Diagnosis present

## 2022-11-18 DIAGNOSIS — R6521 Severe sepsis with septic shock: Secondary | ICD-10-CM | POA: Diagnosis present

## 2022-11-18 DIAGNOSIS — Z7984 Long term (current) use of oral hypoglycemic drugs: Secondary | ICD-10-CM

## 2022-11-18 DIAGNOSIS — E039 Hypothyroidism, unspecified: Secondary | ICD-10-CM | POA: Diagnosis present

## 2022-11-18 DIAGNOSIS — G9341 Metabolic encephalopathy: Secondary | ICD-10-CM | POA: Diagnosis not present

## 2022-11-18 DIAGNOSIS — Z833 Family history of diabetes mellitus: Secondary | ICD-10-CM

## 2022-11-18 DIAGNOSIS — Z79899 Other long term (current) drug therapy: Secondary | ICD-10-CM

## 2022-11-18 DIAGNOSIS — I480 Paroxysmal atrial fibrillation: Secondary | ICD-10-CM | POA: Diagnosis present

## 2022-11-18 DIAGNOSIS — I5042 Chronic combined systolic (congestive) and diastolic (congestive) heart failure: Secondary | ICD-10-CM | POA: Diagnosis present

## 2022-11-18 DIAGNOSIS — Z66 Do not resuscitate: Secondary | ICD-10-CM | POA: Diagnosis present

## 2022-11-18 DIAGNOSIS — E1142 Type 2 diabetes mellitus with diabetic polyneuropathy: Secondary | ICD-10-CM | POA: Diagnosis present

## 2022-11-18 DIAGNOSIS — R195 Other fecal abnormalities: Secondary | ICD-10-CM

## 2022-11-18 DIAGNOSIS — K219 Gastro-esophageal reflux disease without esophagitis: Secondary | ICD-10-CM | POA: Diagnosis present

## 2022-11-18 DIAGNOSIS — R1310 Dysphagia, unspecified: Secondary | ICD-10-CM | POA: Diagnosis present

## 2022-11-18 DIAGNOSIS — R571 Hypovolemic shock: Secondary | ICD-10-CM | POA: Diagnosis present

## 2022-11-18 DIAGNOSIS — R6520 Severe sepsis without septic shock: Secondary | ICD-10-CM | POA: Diagnosis not present

## 2022-11-18 DIAGNOSIS — A419 Sepsis, unspecified organism: Secondary | ICD-10-CM | POA: Diagnosis present

## 2022-11-18 DIAGNOSIS — Z9841 Cataract extraction status, right eye: Secondary | ICD-10-CM

## 2022-11-18 DIAGNOSIS — D649 Anemia, unspecified: Secondary | ICD-10-CM | POA: Diagnosis present

## 2022-11-18 DIAGNOSIS — Z87891 Personal history of nicotine dependence: Secondary | ICD-10-CM

## 2022-11-18 DIAGNOSIS — Z961 Presence of intraocular lens: Secondary | ICD-10-CM | POA: Diagnosis present

## 2022-11-18 DIAGNOSIS — I5022 Chronic systolic (congestive) heart failure: Secondary | ICD-10-CM | POA: Diagnosis present

## 2022-11-18 DIAGNOSIS — E785 Hyperlipidemia, unspecified: Secondary | ICD-10-CM | POA: Diagnosis present

## 2022-11-18 DIAGNOSIS — Z79890 Hormone replacement therapy (postmenopausal): Secondary | ICD-10-CM

## 2022-11-18 DIAGNOSIS — Z7901 Long term (current) use of anticoagulants: Secondary | ICD-10-CM

## 2022-11-18 DIAGNOSIS — F32A Depression, unspecified: Secondary | ICD-10-CM | POA: Diagnosis present

## 2022-11-18 DIAGNOSIS — Z515 Encounter for palliative care: Secondary | ICD-10-CM

## 2022-11-18 DIAGNOSIS — J449 Chronic obstructive pulmonary disease, unspecified: Secondary | ICD-10-CM | POA: Diagnosis present

## 2022-11-18 DIAGNOSIS — M109 Gout, unspecified: Secondary | ICD-10-CM | POA: Diagnosis present

## 2022-11-18 DIAGNOSIS — G40409 Other generalized epilepsy and epileptic syndromes, not intractable, without status epilepticus: Secondary | ICD-10-CM | POA: Diagnosis present

## 2022-11-18 DIAGNOSIS — L89326 Pressure-induced deep tissue damage of left buttock: Secondary | ICD-10-CM | POA: Diagnosis present

## 2022-11-18 DIAGNOSIS — Z7982 Long term (current) use of aspirin: Secondary | ICD-10-CM

## 2022-11-18 LAB — CBC WITH DIFFERENTIAL/PLATELET
Abs Immature Granulocytes: 0.02 10*3/uL (ref 0.00–0.07)
Basophils Absolute: 0 10*3/uL (ref 0.0–0.1)
Basophils Relative: 0 %
Eosinophils Absolute: 0 10*3/uL (ref 0.0–0.5)
Eosinophils Relative: 0 %
HCT: 30.1 % — ABNORMAL LOW (ref 39.0–52.0)
Hemoglobin: 9.5 g/dL — ABNORMAL LOW (ref 13.0–17.0)
Immature Granulocytes: 0 %
Lymphocytes Relative: 8 %
Lymphs Abs: 0.6 10*3/uL — ABNORMAL LOW (ref 0.7–4.0)
MCH: 27.7 pg (ref 26.0–34.0)
MCHC: 31.6 g/dL (ref 30.0–36.0)
MCV: 87.8 fL (ref 80.0–100.0)
Monocytes Absolute: 0.5 10*3/uL (ref 0.1–1.0)
Monocytes Relative: 6 %
Neutro Abs: 6.7 10*3/uL (ref 1.7–7.7)
Neutrophils Relative %: 86 %
Platelets: 290 10*3/uL (ref 150–400)
RBC: 3.43 MIL/uL — ABNORMAL LOW (ref 4.22–5.81)
RDW: 17.8 % — ABNORMAL HIGH (ref 11.5–15.5)
WBC: 7.9 10*3/uL (ref 4.0–10.5)
nRBC: 0 % (ref 0.0–0.2)

## 2022-11-18 LAB — COMPREHENSIVE METABOLIC PANEL
ALT: 21 U/L (ref 0–44)
AST: 22 U/L (ref 15–41)
Albumin: 2.1 g/dL — ABNORMAL LOW (ref 3.5–5.0)
Alkaline Phosphatase: 113 U/L (ref 38–126)
Anion gap: 7 (ref 5–15)
BUN: 91 mg/dL — ABNORMAL HIGH (ref 8–23)
CO2: 20 mmol/L — ABNORMAL LOW (ref 22–32)
Calcium: 8.5 mg/dL — ABNORMAL LOW (ref 8.9–10.3)
Chloride: 106 mmol/L (ref 98–111)
Creatinine, Ser: 3.07 mg/dL — ABNORMAL HIGH (ref 0.61–1.24)
GFR, Estimated: 20 mL/min — ABNORMAL LOW (ref >60)
Glucose, Bld: 105 mg/dL — ABNORMAL HIGH (ref 70–99)
Potassium: 5.8 mmol/L — ABNORMAL HIGH (ref 3.5–5.1)
Sodium: 133 mmol/L — ABNORMAL LOW (ref 135–145)
Total Bilirubin: 1.2 mg/dL (ref 0.3–1.2)
Total Protein: 5.7 g/dL — ABNORMAL LOW (ref 6.5–8.1)

## 2022-11-18 LAB — URINALYSIS, ROUTINE W REFLEX MICROSCOPIC
Bilirubin Urine: NEGATIVE
Glucose, UA: NEGATIVE mg/dL
Ketones, ur: NEGATIVE mg/dL
Nitrite: POSITIVE — AB
Protein, ur: NEGATIVE mg/dL
RBC / HPF: >50 RBC/hpf (ref 0–5)
Specific Gravity, Urine: 1.009 (ref 1.005–1.030)
WBC, UA: >50 WBC/hpf (ref 0–5)
pH: 5 (ref 5.0–8.0)

## 2022-11-18 LAB — TROPONIN I (HIGH SENSITIVITY)
Troponin I (High Sensitivity): 8 ng/L (ref <18)
Troponin I (High Sensitivity): 222 ng/L (ref <18)

## 2022-11-18 LAB — POC OCCULT BLOOD, ED: Fecal Occult Bld: POSITIVE — AB

## 2022-11-18 LAB — PROTIME-INR
INR: 2.9 — ABNORMAL HIGH (ref 0.8–1.2)
Prothrombin Time: 30.1 seconds — ABNORMAL HIGH (ref 11.4–15.2)

## 2022-11-18 LAB — GLUCOSE, CAPILLARY: Glucose-Capillary: 99 mg/dL (ref 70–99)

## 2022-11-18 LAB — PREPARE RBC (CROSSMATCH)

## 2022-11-18 LAB — AMMONIA: Ammonia: 14 umol/L (ref 9–35)

## 2022-11-18 LAB — TSH: TSH: 3.824 u[IU]/mL (ref 0.350–4.500)

## 2022-11-18 LAB — LACTIC ACID, PLASMA: Lactic Acid, Venous: 0.9 mmol/L (ref 0.5–1.9)

## 2022-11-18 LAB — MAGNESIUM: Magnesium: 2.5 mg/dL — ABNORMAL HIGH (ref 1.7–2.4)

## 2022-11-18 MED ORDER — TAMSULOSIN HCL 0.4 MG PO CAPS
0.4000 mg | ORAL_CAPSULE | Freq: Every day | ORAL | Status: DC
  Administered 2022-11-19 – 2022-11-21 (×3): 0.4 mg via ORAL
  Filled 2022-11-18 (×3): qty 1

## 2022-11-18 MED ORDER — SODIUM CHLORIDE 0.9 % IV SOLN: INTRAVENOUS | Status: AC

## 2022-11-18 MED ORDER — LEVOTHYROXINE SODIUM 100 MCG PO TABS
100.0000 ug | ORAL_TABLET | Freq: Every day | ORAL | Status: DC
  Administered 2022-11-20 – 2022-11-21 (×2): 100 ug via ORAL
  Filled 2022-11-18 (×2): qty 1

## 2022-11-18 MED ORDER — ONDANSETRON HCL 4 MG/2ML IJ SOLN: 4.0000 mg | Freq: Four times a day (QID) | INTRAMUSCULAR | Status: DC | PRN

## 2022-11-18 MED ORDER — LEVETIRACETAM 500 MG PO TABS: 1000.0000 mg | ORAL_TABLET | Freq: Two times a day (BID) | ORAL | Status: DC

## 2022-11-18 MED ORDER — POLYETHYLENE GLYCOL 3350 17 G PO PACK: 17.0000 g | PACK | Freq: Every day | ORAL | Status: DC | PRN

## 2022-11-18 MED ORDER — ONDANSETRON HCL 4 MG PO TABS: 4.0000 mg | ORAL_TABLET | Freq: Four times a day (QID) | ORAL | Status: DC | PRN

## 2022-11-18 MED ORDER — SODIUM CHLORIDE 0.9 % IV BOLUS: 1000.0000 mL | Freq: Once | INTRAVENOUS | Status: DC

## 2022-11-18 MED ORDER — ACETAMINOPHEN 650 MG RE SUPP: 650.0000 mg | Freq: Four times a day (QID) | RECTAL | Status: DC | PRN

## 2022-11-18 MED ORDER — SODIUM CHLORIDE 0.9 % IV BOLUS
1000.0000 mL | Freq: Once | INTRAVENOUS | Status: AC
  Administered 2022-11-18: 1000 mL via INTRAVENOUS

## 2022-11-18 MED ORDER — LORAZEPAM 2 MG/ML IJ SOLN
1.0000 mg | Freq: Once | INTRAMUSCULAR | Status: AC
  Administered 2022-11-18: 1 mg via INTRAVENOUS
  Filled 2022-11-18: qty 1

## 2022-11-18 MED ORDER — SODIUM CHLORIDE 0.9% IV SOLUTION: Freq: Once | INTRAVENOUS | Status: DC

## 2022-11-18 MED ORDER — PANTOPRAZOLE SODIUM 40 MG IV SOLR
40.0000 mg | INTRAVENOUS | Status: DC
  Administered 2022-11-18 – 2022-11-20 (×3): 40 mg via INTRAVENOUS
  Filled 2022-11-18 (×3): qty 10

## 2022-11-18 MED ORDER — VANCOMYCIN HCL 1500 MG/300ML IV SOLN
1500.0000 mg | Freq: Once | INTRAVENOUS | Status: AC
  Administered 2022-11-18: 1500 mg via INTRAVENOUS
  Filled 2022-11-18: qty 300

## 2022-11-18 MED ORDER — SODIUM CHLORIDE 0.9 % IV SOLN
250.0000 mL | INTRAVENOUS | Status: DC
  Administered 2022-11-18: 250 mL via INTRAVENOUS

## 2022-11-18 MED ORDER — PANTOPRAZOLE SODIUM 40 MG IV SOLR
40.0000 mg | Freq: Once | INTRAVENOUS | Status: AC
  Administered 2022-11-18: 40 mg via INTRAVENOUS
  Filled 2022-11-18: qty 10

## 2022-11-18 MED ORDER — PHENYLEPHRINE HCL-NACL 20-0.9 MG/250ML-% IV SOLN: 25.0000 ug/min | INTRAVENOUS | Status: DC

## 2022-11-18 MED ORDER — PHENYLEPHRINE HCL-NACL 20-0.9 MG/250ML-% IV SOLN
INTRAVENOUS | Status: AC
  Administered 2022-11-19: 25 ug/min via INTRAVENOUS
  Filled 2022-11-18: qty 250

## 2022-11-18 MED ORDER — ACETAMINOPHEN 325 MG PO TABS
ORAL_TABLET | ORAL | Status: AC
  Filled 2022-11-18: qty 1

## 2022-11-18 MED ORDER — NOREPINEPHRINE 4 MG/250ML-% IV SOLN: 2.0000 ug/min | INTRAVENOUS | Status: DC

## 2022-11-18 MED ORDER — ACETAMINOPHEN 325 MG PO TABS: 650.0000 mg | ORAL_TABLET | Freq: Four times a day (QID) | ORAL | Status: DC | PRN

## 2022-11-18 MED ORDER — INSULIN ASPART 100 UNIT/ML IJ SOLN
0.0000 [IU] | Freq: Four times a day (QID) | INTRAMUSCULAR | Status: DC
  Administered 2022-11-19 – 2022-11-20 (×5): 2 [IU] via SUBCUTANEOUS

## 2022-11-18 MED ORDER — SODIUM CHLORIDE 0.9 % IV BOLUS
500.0000 mL | Freq: Once | INTRAVENOUS | Status: AC
  Administered 2022-11-19: 500 mL via INTRAVENOUS

## 2022-11-18 MED ORDER — DILTIAZEM HCL 25 MG/5ML IV SOLN
INTRAVENOUS | Status: AC
  Administered 2022-11-18: 10 mg
  Filled 2022-11-18: qty 5

## 2022-11-18 MED ORDER — ALLOPURINOL 100 MG PO TABS
100.0000 mg | ORAL_TABLET | Freq: Every day | ORAL | Status: DC
  Administered 2022-11-19 – 2022-11-21 (×3): 100 mg via ORAL
  Filled 2022-11-18 (×3): qty 1

## 2022-11-18 MED ORDER — LACTATED RINGERS IV BOLUS
500.0000 mL | Freq: Once | INTRAVENOUS | Status: AC
  Administered 2022-11-18: 500 mL via INTRAVENOUS

## 2022-11-18 MED ORDER — VANCOMYCIN VARIABLE DOSE PER UNSTABLE RENAL FUNCTION (PHARMACIST DOSING): Status: DC

## 2022-11-18 MED ORDER — SODIUM CHLORIDE 0.9 % IV SOLN
1.0000 g | INTRAVENOUS | Status: DC
  Administered 2022-11-18: 1 g via INTRAVENOUS
  Filled 2022-11-18: qty 10

## 2022-11-18 MED ORDER — ACETAMINOPHEN 325 MG PO TABS
650.0000 mg | ORAL_TABLET | Freq: Once | ORAL | Status: AC
  Administered 2022-11-18: 650 mg via ORAL

## 2022-11-18 MED ORDER — DILTIAZEM HCL-DEXTROSE 125-5 MG/125ML-% IV SOLN (PREMIX)
5.0000 mg/h | INTRAVENOUS | Status: DC
  Administered 2022-11-18: 10 mg/h via INTRAVENOUS
  Filled 2022-11-18: qty 125

## 2022-11-18 MED ORDER — SODIUM CHLORIDE 0.9 % IV SOLN
2.0000 g | INTRAVENOUS | Status: DC
  Administered 2022-11-19 – 2022-11-20 (×2): 2 g via INTRAVENOUS
  Filled 2022-11-18 (×2): qty 20

## 2022-11-18 MED ORDER — METRONIDAZOLE 500 MG/100ML IV SOLN
500.0000 mg | Freq: Two times a day (BID) | INTRAVENOUS | Status: DC
  Administered 2022-11-18 – 2022-11-20 (×4): 500 mg via INTRAVENOUS
  Filled 2022-11-18 (×4): qty 100

## 2022-11-18 MED ORDER — LACTATED RINGERS IV BOLUS
1000.0000 mL | Freq: Once | INTRAVENOUS | Status: AC
  Administered 2022-11-18: 1000 mL via INTRAVENOUS

## 2022-11-18 MED ORDER — ACETAMINOPHEN 325 MG PO TABS
ORAL_TABLET | ORAL | Status: AC
  Filled 2022-11-18: qty 2

## 2022-11-18 MED ORDER — SODIUM CHLORIDE 0.9 % IV SOLN: 250.0000 mL | INTRAVENOUS | Status: DC

## 2022-11-18 MED ORDER — LIDOCAINE HCL URETHRAL/MUCOSAL 2 % EX GEL
1.0000 | Freq: Once | CUTANEOUS | Status: AC
  Administered 2022-11-18: 1 via URETHRAL

## 2022-11-18 MED ORDER — NOREPINEPHRINE 4 MG/250ML-% IV SOLN
INTRAVENOUS | Status: AC
  Administered 2022-11-18: 2 ug/min via INTRAVENOUS
  Filled 2022-11-18: qty 250

## 2022-11-18 MED ORDER — SODIUM CHLORIDE 0.9 % IV BOLUS
500.0000 mL | Freq: Once | INTRAVENOUS | Status: AC
  Administered 2022-11-18: 500 mL via INTRAVENOUS

## 2022-11-18 MED ORDER — PHENYLEPHRINE HCL-NACL 20-0.9 MG/250ML-% IV SOLN: 0.0000 ug/min | INTRAVENOUS | Status: DC

## 2022-11-18 MED ORDER — SODIUM CHLORIDE 0.9 % IV SOLN
1.0000 g | Freq: Once | INTRAVENOUS | Status: AC
  Administered 2022-11-18: 1 g via INTRAVENOUS
  Filled 2022-11-18: qty 10

## 2022-11-18 MED ORDER — DILTIAZEM LOAD VIA INFUSION
10.0000 mg | Freq: Once | INTRAVENOUS | Status: DC
  Filled 2022-11-18: qty 10

## 2022-11-18 MED ORDER — LEVETIRACETAM IN NACL 1000 MG/100ML IV SOLN
1000.0000 mg | Freq: Two times a day (BID) | INTRAVENOUS | Status: DC
  Administered 2022-11-19 (×3): 1000 mg via INTRAVENOUS
  Filled 2022-11-18 (×3): qty 100

## 2022-11-18 NOTE — Assessment & Plan Note (Signed)
Reports urinary symptoms, UA suggestive of UTI.  Presenting with hypotension, hypothermia, normal lactic acid 0.9.   While on bair huggers temperature increased to Tmax of 100.4.  Last urine culture 03/2022, pansensitive, Klebsiella. - Follow-up blood cultures  -Add on urine cultures -IV Rocephin

## 2022-11-18 NOTE — Assessment & Plan Note (Signed)
Hemoglobin 9.5, stool occult positive- brown stool without blood, baseline hemoglobin over the past 2 months 12-14.  He is on anticoagulation with Eliquis.  He denies melena, hematemesis or hematochezia.  -Due to concerns for GI bleed as etiology for hypotension, patient was given 1 unit PRBC in ED -Hold Eliquis and aspirin for now -Trend hemoglobin -IV Protonix 40 daily

## 2022-11-18 NOTE — ED Notes (Addendum)
EDP at [facility]. Remains alert, NAD, calm, interactive. GCS 15. VSS, improved. Bair hugger continues. Tolerating transfusion.

## 2022-11-18 NOTE — ED Notes (Signed)
Calmer, less restless. VSS. VS improved. Remains alert, NAD, interactive, cooperative, following commands.

## 2022-11-18 NOTE — ED Provider Notes (Signed)
[facility] Provider Note   CSN: [id number] Arrival date & time: 11/18/22  1052     History  Chief Complaint  Patient presents with   Altered Mental Status   Hypotension    Michael [name] is a 77 y.o. male.  Level 5 caveat secondary to altered mental status.  Patient is brought in from rehab facility for altered mental status x 3 days.  Blood pressure reportedly low given IV fluids by EMS.  Patient here does not know why he was transported.  He denies any specific complaints other than being thirsty and cold.  He knows he is in rehab he does not remember why.  He denies any chest pain shortness of breath abdominal pain vomiting diarrhea or urinary symptoms.  He does not know what the date is.  The history is provided by the patient.  Altered Mental Status Presenting symptoms: confusion   Context: recent illness   Associated symptoms: no abdominal pain, no fever, no nausea and no vomiting        Home Medications Prior to Admission medications   Medication Sig Start Date End Date Taking? Authorizing Provider  acetaminophen (TYLENOL) 325 MG tablet Take 2 tablets (650 mg total) by mouth every 6 (six) hours as needed. 10/01/22 10/01/23  Mercy Riding, MD  allopurinol (ZYLOPRIM) 100 MG tablet Take 100 mg by mouth daily.    [provider]  apixaban (ELIQUIS) 5 MG TABS tablet Take 1 tablet (5 mg total) by mouth 2 (two) times daily. 10/01/22   Mercy Riding, MD  bumetanide (BUMEX) 2 MG tablet Take 1.5 tablets (3 mg total) by mouth 2 (two) times daily. 11/01/19   [name], MD  carvedilol (COREG) 25 MG tablet Take 1 tablet (25 mg total) by mouth 2 (two) times daily with a meal. 10/01/22 12/30/22  Mercy Riding, MD  Cholecalciferol (VITAMIN D3) 10 MCG (400 UNIT) tablet Take 400 Units by mouth daily.    [provider]  Ferrous Sulfate (IRON) 325 (65 FE) MG TABS Take 325 mg by mouth daily.    [provider]  gabapentin  (NEURONTIN) 400 MG capsule Take 1 capsule (400 mg total) by mouth 3 (three) times daily. 03/04/18   [name], MD  insulin glargine-yfgn [facility]) 100 UNIT/ML Pen  06/18/22   [provider]  levETIRAcetam (KEPPRA) 1000 MG tablet Take 1 tablet (1,000 mg total) by mouth 2 (two) times daily. 10/01/22   Mercy Riding, MD  levothyroxine (SYNTHROID) 100 MCG tablet Take 100 mcg by mouth daily before breakfast.    [provider]  loperamide (IMODIUM A-D) 2 MG tablet Take 2 mg by mouth daily as needed for diarrhea or loose stools.    [provider]  magnesium oxide (MAG-OX) 400 MG tablet Take 1 tablet (400 mg total) by mouth 2 (two) times daily. 11/19/17   Imogene Burn, PA-C  metFORMIN (GLUCOPHAGE) 1000 MG tablet TAKE ONE TABLET BY MOUTH TWICE DAILY WITH A MEAL. Patient taking differently: Take 500 mg by mouth 2 (two) times daily with a meal. 09/24/19   Sande Rives E, PA-C  metolazone (ZAROXOLYN) 2.5 MG tablet Two Times Weekly on Monday and Thursday Patient taking differently: Take 2.5 mg by mouth 2 (two) times a week. Monday and Thursday 02/08/20   [name], MD  Multiple Vitamin (MULTIVITAMIN WITH MINERALS) TABS tablet Take 1 tablet by mouth daily. 10/02/22   Mercy Riding,  MD  nystatin cream (MYCOSTATIN) APPLY TO AFFECTED AREA TWICE DAILY. Patient taking differently: Apply 1 Application topically 2 (two) times daily as needed (redness). 03/04/18   [name], MD  pantoprazole (PROTONIX) 40 MG tablet Take 1 tablet (40 mg total) by mouth daily. 02/08/20   [name], MD  polyethylene glycol powder (MIRALAX) 17 GM/SCOOP powder Take 17 g by mouth 2 (two) times daily as needed for moderate constipation or mild constipation. 10/01/22   Mercy Riding, MD  potassium chloride (KLOR-CON) 10 MEQ tablet Take 10 mEq by mouth 2 (two) times daily.    [provider]  pravastatin (PRAVACHOL) 40 MG tablet TAKE ONE TABLET BY MOUTH DAILY. Patient taking  differently: Take 40 mg by mouth daily. 05/21/16   [place], Modena Nunnery, MD  sacubitril-valsartan (ENTRESTO) 49-51 MG Take 1 tablet by mouth 2 (two) times daily. 02/08/20   [name], MD  senna-docusate (SENOKOT-S) 8.6-50 MG tablet Take 1 tablet by mouth 2 (two) times daily between meals as needed for mild constipation. 10/01/22   Mercy Riding, MD  tamsulosin (FLOMAX) 0.4 MG CAPS capsule TAKE (1) CAPSULE BY MOUTH EVERY DAY. Patient taking differently: Take 0.4 mg by mouth daily. 10/10/21   Lindell Spar, MD  terbinafine (LAMISIL) 250 MG tablet TAKE ONE TABLET BY MOUTH ONCE DAILY. 08/20/22   Lindell Spar, MD  traZODone (DESYREL) 50 MG tablet Take 1 tablet (50 mg total) by mouth at bedtime. TAKE 1 TABLET BY MOUTH AT BEDTIME AS NEEDED FOR SLEEP. 10/01/22   Mercy Riding, MD  triamcinolone cream (KENALOG) 0.1 % Apply 1 Application topically 2 (two) times daily.    [provider]  vitamin B-12 (VITAMIN B12) 500 MCG tablet Take 3 tablets (1,500 mcg total) by mouth daily. 10/02/22   Mercy Riding, MD      Allergies    Patient has no known allergies.    Review of Systems   Review of Systems  Unable to perform ROS: Mental status change  Constitutional:  Negative for fever.  Eyes:  Negative for visual disturbance.  Gastrointestinal:  Negative for abdominal pain, nausea and vomiting.  Psychiatric/Behavioral:  Positive for confusion.     Physical Exam Updated Vital Signs BP (!) 76/52   Pulse 82   Temp 97.7 F (36.5 C) (Oral)   Resp 19   Ht 4' 11"$  (1.499 m)   Wt 76.7 kg   SpO2 99%   BMI 34.17 kg/m  Physical Exam Vitals and nursing note reviewed.  Constitutional:      General: He is not in acute distress.    Appearance: Normal appearance. He is well-developed.  HENT:     Head: Normocephalic and atraumatic.  Eyes:     Conjunctiva/sclera: Conjunctivae normal.  Cardiovascular:     Rate and Rhythm: Normal rate and regular rhythm.     Heart sounds: No murmur heard. Pulmonary:      Effort: Pulmonary effort is normal. No respiratory distress.     Breath sounds: Normal breath sounds.  Abdominal:     Palpations: Abdomen is soft.     Tenderness: There is no abdominal tenderness. There is no guarding or rebound.  Musculoskeletal:     Cervical back: Neck supple.     Right lower leg: Edema present.     Left lower leg: Edema present.     Comments: He has edema around his ankles and feet and some chronic stasis changes of his lower extremities.  Skin:  General: Skin is warm and dry.     Capillary Refill: Capillary refill takes less than 2 seconds.  Neurological:     General: No focal deficit present.     Mental Status: He is alert. He is disoriented.     Motor: No weakness.     Comments: Patient is alert and conversant.  He is confused about some details but is able to carry on a fluid conversation.  He is moving all extremities without any focal deficits.     ED Results / Procedures / Treatments   Labs (all labs ordered are listed, but only abnormal results are displayed) Labs Reviewed  URINALYSIS, ROUTINE W REFLEX MICROSCOPIC - Abnormal; Notable for the following components:      Result Value   APPearance HAZY (*)    Hgb urine dipstick LARGE (*)    Nitrite POSITIVE (*)    Leukocytes,Ua LARGE (*)    Bacteria, UA MANY (*)    All other components within normal limits  COMPREHENSIVE METABOLIC PANEL - Abnormal; Notable for the following components:   Sodium 133 (*)    Potassium 5.8 (*)    CO2 20 (*)    Glucose, Bld 105 (*)    BUN 91 (*)    Creatinine, Ser 3.07 (*)    Calcium 8.5 (*)    Total Protein 5.7 (*)    Albumin 2.1 (*)    GFR, Estimated 20 (*)    All other components within normal limits  CBC WITH DIFFERENTIAL/PLATELET - Abnormal; Notable for the following components:   RBC 3.43 (*)    Hemoglobin 9.5 (*)    HCT 30.1 (*)    RDW 17.8 (*)    Lymphs Abs 0.6 (*)    All other components within normal limits  PROTIME-INR - Abnormal; Notable for  the following components:   Prothrombin Time 30.1 (*)    INR 2.9 (*)    All other components within normal limits  MAGNESIUM - Abnormal; Notable for the following components:   Magnesium 2.5 (*)    All other components within normal limits  POC OCCULT BLOOD, ED - Abnormal; Notable for the following components:   Fecal Occult Bld POSITIVE (*)    All other components within normal limits  CULTURE, BLOOD (ROUTINE X 2)  CULTURE, BLOOD (ROUTINE X 2)  AMMONIA  LACTIC ACID, PLASMA  TSH  URINALYSIS, W/ REFLEX TO CULTURE (INFECTION SUSPECTED)  TYPE AND SCREEN  PREPARE RBC (CROSSMATCH)    EKG EKG Interpretation  Date/Time:  Monday November 18 2022 13:18:49 EST Ventricular Rate:  81 PR Interval:  136 QRS Duration: 95 QT Interval:  389 QTC Calculation: 452 R Axis:   4 Text Interpretation: Sinus rhythm Anterior infarct, old No significant change since prior 12/23 Confirmed by Aletta Edouard [phone]) on 11/18/2022 1:33:48 PM  Radiology DG Chest Port 1 View  Result Date: 11/18/2022 CLINICAL DATA:  Altered mental status EXAM: PORTABLE CHEST 1 VIEW COMPARISON:  09/21/2022 x-ray and older.  CT angiogram 09/23/2022 FINDINGS: The heart size and mediastinal contours are within normal limits. Both lungs are clear. The visualized skeletal structures are unremarkable. Calcified aorta. Overlapping gown snaps. Lordotic x-ray. IMPRESSION: No active disease. Electronically Signed   By: Jill Side M.D.   On: 11/18/2022 12:23    Procedures .Critical Care  Performed by: Hayden Rasmussen, MD Authorized by: Hayden Rasmussen, MD   Critical care provider statement:    Critical care time (minutes):  45   Critical care time  was exclusive of:  Separately billable procedures and treating other patients   Critical care was necessary to treat or prevent imminent or life-threatening deterioration of the following conditions:  Circulatory failure   Critical care was time spent personally by me on the following  activities:  Development of treatment plan with patient or surrogate, discussions with consultants, evaluation of patient's response to treatment, examination of patient, obtaining history from patient or surrogate, ordering and performing treatments and interventions, ordering and review of laboratory studies, ordering and review of radiographic studies, pulse oximetry, re-evaluation of patient's condition and review of old charts   I assumed direction of critical care for this patient from another provider in my specialty: no       Medications Ordered in ED Medications  cefTRIAXone (ROCEPHIN) 1 g in sodium chloride 0.9 % 100 mL IVPB (has no administration in time range)  lactated ringers bolus 500 mL (0 mLs Intravenous Stopped 11/18/22 1235)  lactated ringers bolus 500 mL (0 mLs Intravenous Stopped 11/18/22 1319)  pantoprazole (PROTONIX) injection 40 mg (40 mg Intravenous Given 11/18/22 1312)  lidocaine (XYLOCAINE) 2 % jelly 1 Application (1 Application Urethral Given 11/18/22 1337)  lactated ringers bolus 1,000 mL (0 mLs Intravenous Stopped 11/18/22 1435)  sodium chloride 0.9 % bolus 1,000 mL (1,000 mLs Intravenous New Bag/Given 11/18/22 1716)    ED Course/ Medical Decision Making/ A&P Clinical Course as of 11/18/22 1605  Mon Nov 18, 2022  1206 X-ray interpreted by me as no acute infiltrate.  Awaiting radiology reading. [MB]  1322 ECG not crossing in epic.  Normal sinus rhythm low voltage old anterior infarct no significant changes from prior. [MB]  [id number] Discussed with Dr. Denton Brick [facility] hospitalist who will evaluate patient for admission. [MB]  [id number] Discussed with Dr. Abbey Chatters from GI.  He said they will consult on the patient. [MB]    Clinical Course User Index [MB] Hayden Rasmussen, MD                             Medical Decision Making Amount and/or Complexity of Data Reviewed Labs: ordered. Radiology: ordered.  Risk Prescription drug management. Decision regarding  hospitalization.   This patient complains of low blood pressure altered mental status; this involves an extensive number of treatment Options and is a complaint that carries with it a high risk of complications and morbidity. The differential includes sepsis, Sirs, anemia, GI bleed, ACS, infection  I ordered, reviewed and interpreted labs, which included CBC with normal white count, hemoglobin lower than priors, chemistries with elevated potassium elevated BUN and creatinine, lactate normal, fecal occult positive, ammonia normal I ordered medication IV fluids transfusion of packed red blood cells PPI and reviewed PMP when indicated. I ordered imaging studies which included chest x-ray and I independently    visualized and interpreted imaging which showed no acute infiltrate Additional history obtained from EMS Previous records obtained and reviewed in epic including recent discharge summary and cardiology notes I consulted Dr. Abbey Chatters GI and [facility] hospitalist Dr. Denton Brick and discussed lab and imaging findings and discussed disposition.  Cardiac monitoring reviewed, normal sinus rhythm Social determinants considered, patient is physically inactive Critical Interventions: IV fluids and transfusion of packed red blood cells for symptomatic hypotension  After the interventions stated above, I reevaluated the patient and found patient to be symptomatically improving. Admission and further testing considered, he would benefit from the hospital for continued management of his hypotension and further  GI evaluation.         Final Clinical Impression(s) / ED Diagnoses Final diagnoses:  Symptomatic anemia  Hypotension due to hypovolemia  Heme positive stool    Rx / DC Orders ED Discharge Orders     None         Hayden Rasmussen, MD 11/18/22 1721

## 2022-11-18 NOTE — ED Notes (Signed)
Pt c/o painful urination.  

## 2022-11-18 NOTE — Assessment & Plan Note (Signed)
Stable and compensated, appears hypovolemic.  08/2022, EF of 50 to 55%. -Hold Entresto, metolazone, Bumex, carvedilol

## 2022-11-18 NOTE — Assessment & Plan Note (Addendum)
Heart rate initially 80s spikes to 150.  On anticoagulation with Eliquis.  He is now reporting some chest pain. -Check EKG-confirms atrial fibrillation, several artifacts, subsequent EKG shows T wave inversions in leads V5 and V6, as he is severely septic, will trend troponin for now. - Hold Eliquis for now with acute anemia -IV Cardizem 10 mg, with Cardizem drip.

## 2022-11-18 NOTE — ED Notes (Signed)
Admitting MD notified of fever, chills, tachycardia.

## 2022-11-18 NOTE — ED Notes (Signed)
Bethena Roys, MD at [facility]

## 2022-11-18 NOTE — H&P (Addendum)
History and Physical    Michael [name] [id number] DOB: 08/16/1946 DOA: 11/18/2022  PCP: [facility]  Patient coming from: [facility] [place]  I have personally briefly reviewed patient's old medical records in [place]  Chief Complaint: AMS  HPI: Michael [name] is a 77 y.o. male with medical history significant for atrial fibrillation on chronic anticoagulation, hypertension, gout, diabetes mellitus, COPD, CHF, [place]. Patient was brought to the ED from nursing home via EMS reports of altered mental status.  On my evaluation, patient is awake alert oriented and able to answer questions appropriately, but he does not know why he is in the hospital.  He endorses pain with urination.  He denies vomiting, no diarrhea, cough no fever no chills, no abdominal pain, no black stools no blood in stools, no bleeding from any orifice.  I called the nursing home, unfortunately history from nursing home is limited.  I talked to one of the staff, reading from the notes left by the prior provider, patient had complained of not feeling well, so recent that patient was disoriented.  Staff not able to explain further reports of altered mental status.  At baseline, patient is awake alert oriented x 4, no significant memory problems, ambulates with a wheelchair.  Reports a week ago patient's had reduced oral intake.  On EMS arrival, patient was hypotensive.  ED Course:  Blood pressure down to 65/22, temperature down to 96.6 he was placed on Bair hugger.  Heart rate 72- 96.  WBC 7.9.  Creatinine elevated 3.07.  Lactic acid 0.9.  INR 2.9.  UA pending at the time.  Hemoglobin 9.5, stool occult positive.  Hypotension patient being on anticoagulation, does consent for possible GI bleed, so 1 units PRBC ordered for transfusion, 1.5 L bolus also given.  Review of Systems: As per HPI all other systems reviewed and negative.  Past Medical History:  Diagnosis Date   Arthritis    Congestive  heart disease ([place]) 11/2015   COPD (chronic obstructive pulmonary disease) (HCC)    DDD (degenerative disc disease), lumbosacral    Demand ischemia 09/23/2019   Depression    Diabetic neuropathy (HCC)    Diastolic dysfunction    Essential hypertension    GERD (gastroesophageal reflux disease)    Hyperlipidemia    Iron deficiency anemia    Left knee DJD    Lumbar spinal stenosis    Lymphedema 11/2015   BOTH LEGS   NASH (nonalcoholic steatohepatitis)    Peripheral edema    Peripheral edema 10/05/2015   Type 2 diabetes mellitus ([place])     Past Surgical History:  Procedure Laterality Date   BIOPSY N/A 07/21/2014   Procedure: BIOPSY;  Surgeon: Daneil Dolin, MD;  Location: [facility];  Service: Endoscopy;  Laterality: N/A;   CATARACT EXTRACTION W/PHACO Right 07/08/2016   Procedure: CATARACT EXTRACTION PHACO AND INTRAOCULAR LENS PLACEMENT RIGHT EYE;  Surgeon: Tonny Branch, MD;  Location: [facility];  Service: Ophthalmology;  Laterality: Right;  CDE: 9.01   CATARACT EXTRACTION W/PHACO Left 07/22/2016   Procedure: CATARACT EXTRACTION PHACO AND INTRAOCULAR LENS PLACEMENT LEFT EYE CDE=10.69;  Surgeon: Tonny Branch, MD;  Location: [facility];  Service: Ophthalmology;  Laterality: Left;  left -    COLONOSCOPY  2011   [facility]: normal colon, normal distal ileum   COLONOSCOPY WITH PROPOFOL N/A 07/21/2014   Procedure: ATTEMPTED COLONOSCOPY WITH PROPOFOL-HAD TO STOP DUE TO BRADYCARDIA;  Surgeon: Daneil Dolin, MD;  Location: [facility];  Service: Endoscopy;  Laterality: N/A;   EGD with enteroscopy  2011   [facility]: normal esophagus and stomach. Normal duodenum, jejunum. No evidence of AVMs.    ESOPHAGOGASTRODUODENOSCOPY (EGD) WITH PROPOFOL N/A 07/21/2014   Procedure: ESOPHAGOGASTRODUODENOSCOPY (EGD) WITH PROPOFOL;  Surgeon: Daneil Dolin, MD;  Location: [facility];  Service: Endoscopy;  Laterality: N/A;   HERNIA REPAIR     KNEE ARTHROSCOPY WITH MEDIAL MENISECTOMY Left 11/06/2012   Procedure: KNEE ARTHROSCOPY WITH  MEDIAL MENISECTOMY;  Surgeon: Carole Civil, MD;  Location: [facility];  Service: Orthopedics;  Laterality: Left;   Lipoma removal     Stomach   LUMBAR LAMINECTOMY/DECOMPRESSION MICRODISCECTOMY Left 08/30/2013   Procedure: LUMBAR LAMINECTOMY/DECOMPRESSION MICRODISCECTOMY LEFT  LUMBAR TWO THREE;  Surgeon: Otilio Connors, MD;  Location: [place] [facility];  Service: Neurosurgery;  Laterality: Left;   RIGHT/LEFT HEART CATH AND CORONARY ANGIOGRAPHY N/A 09/22/2019   Procedure: RIGHT/LEFT HEART CATH AND CORONARY ANGIOGRAPHY;  Surgeon: Martinique, Peter M, MD;  Location: [place] [facility];  Service: Cardiovascular;  Laterality: N/A;   SHOULDER SURGERY     Rght-rotator cuff   TOOTH EXTRACTION       reports that he quit smoking about 23 years ago. His smoking use included cigarettes. He started smoking about 67 years ago. He has a 110.00 pack-year smoking history. He has never used smokeless tobacco. He reports that he does not drink alcohol and does not use drugs.  No Known Allergies  Family History  Problem Relation Age of Onset   Heart disease Mother    Hyperlipidemia Mother    Hypertension Mother    Depression Mother    Diabetes Mother    Rectal cancer Mother    Cancer Mother    Heart disease Father    Hypertension Father    Hyperlipidemia Father    Diabetes Father    Cancer Father    Heart disease Sister    Hyperlipidemia Sister    Hypertension Sister    Diabetes Sister    Diabetes Brother    Heart disease Sister    Hyperlipidemia Sister    Hypertension Sister    Heart disease Sister    Hyperlipidemia Sister    Hypertension Sister    Diabetes Sister    Diabetes Brother     Prior to Admission medications   Medication Sig Start Date End Date Taking? Authorizing Provider  acetaminophen (TYLENOL) 325 MG tablet Take 2 tablets (650 mg total) by mouth every 6 (six) hours as needed. 10/01/22 10/01/23 Yes Mercy Riding, MD  allopurinol (ZYLOPRIM) 100 MG tablet Take 100 mg by mouth daily.   Yes  [provider]  apixaban (ELIQUIS) 5 MG TABS tablet Take 1 tablet (5 mg total) by mouth 2 (two) times daily. 10/01/22  Yes Mercy Riding, MD  aspirin 81 MG chewable tablet Chew 81 mg by mouth daily.   Yes [provider]  bumetanide (BUMEX) 1 MG tablet Take 1 mg by mouth See admin instructions. Take 3 tablets (78m) by mouth daily 11/05/22  Yes [provider]  carvedilol (COREG) 12.5 MG tablet Take 12.5 mg by mouth 2 (two) times daily. 11/12/22  Yes [provider]  Cholecalciferol (VITAMIN D3) 10 MCG (400 UNIT) tablet Take 400 Units by mouth daily.   Yes [provider]  Ferrous Sulfate (IRON) 325 (65 FE) MG TABS Take 325 mg by mouth daily.   Yes [provider]  folic acid (FOLVITE) 1 MG tablet Take 1 mg by  mouth daily. 11/05/22  Yes [provider]  gabapentin (NEURONTIN) 400 MG capsule Take 1 capsule (400 mg total) by mouth 3 (three) times daily. 03/04/18  Yes Rhodell, Modena Nunnery, MD  LANTUS SOLOSTAR 100 UNIT/ML Solostar Pen Inject 10 Units into the skin at bedtime. 10/29/22  Yes [provider]  levETIRAcetam (KEPPRA) 1000 MG tablet Take 1 tablet (1,000 mg total) by mouth 2 (two) times daily. 10/01/22  Yes Mercy Riding, MD  levothyroxine (SYNTHROID) 100 MCG tablet Take 100 mcg by mouth daily before breakfast.   Yes [provider]  loperamide (IMODIUM A-D) 2 MG tablet Take 2 mg by mouth daily as needed for diarrhea or loose stools.   Yes [provider]  magnesium oxide (MAG-OX) 400 MG tablet Take 1 tablet (400 mg total) by mouth 2 (two) times daily. 11/19/17  Yes Imogene Burn, PA-C  metFORMIN (GLUCOPHAGE) 1000 MG tablet TAKE ONE TABLET BY MOUTH TWICE DAILY WITH A MEAL. Patient taking differently: Take 1,000 mg by mouth 2 (two) times daily with a meal. 09/24/19  Yes Sande Rives E, PA-C  metolazone (ZAROXOLYN) 2.5 MG tablet Two Times Weekly on Monday and Thursday Patient taking differently: Take 2.5 mg by mouth  2 (two) times a week. Monday and Thursday 02/08/20  Yes BranchAlphonse Guild, MD  Multiple Vitamin (MULTIVITAMIN WITH MINERALS) TABS tablet Take 1 tablet by mouth daily. 10/02/22  Yes Mercy Riding, MD  naproxen (NAPROSYN) 500 MG tablet Take 500 mg by mouth 2 (two) times daily. 11/05/22  Yes [provider]  pantoprazole (PROTONIX) 40 MG tablet Take 1 tablet (40 mg total) by mouth daily. 02/08/20  Yes Branch, Alphonse Guild, MD  polyethylene glycol powder (MIRALAX) 17 GM/SCOOP powder Take 17 g by mouth 2 (two) times daily as needed for moderate constipation or mild constipation. 10/01/22  Yes Mercy Riding, MD  potassium chloride (KLOR-CON) 10 MEQ tablet Take 10 mEq by mouth 2 (two) times daily.   Yes [provider]  pravastatin (PRAVACHOL) 40 MG tablet TAKE ONE TABLET BY MOUTH DAILY. Patient taking differently: Take 40 mg by mouth daily. 05/21/16  Yes Captiva, Modena Nunnery, MD  sacubitril-valsartan (ENTRESTO) 49-51 MG Take 1 tablet by mouth 2 (two) times daily. 02/08/20  Yes Branch, Alphonse Guild, MD  senna-docusate (SENOKOT-S) 8.6-50 MG tablet Take 1 tablet by mouth 2 (two) times daily between meals as needed for mild constipation. 10/01/22  Yes Mercy Riding, MD  tamsulosin (FLOMAX) 0.4 MG CAPS capsule TAKE (1) CAPSULE BY MOUTH EVERY DAY. Patient taking differently: Take 0.4 mg by mouth daily. 10/10/21  Yes Lindell Spar, MD  traZODone (DESYREL) 50 MG tablet Take 1 tablet (50 mg total) by mouth at bedtime. TAKE 1 TABLET BY MOUTH AT BEDTIME AS NEEDED FOR SLEEP. 10/01/22  Yes Mercy Riding, MD  carvedilol (COREG) 25 MG tablet Take 1 tablet (25 mg total) by mouth 2 (two) times daily with a meal. Patient not taking: Reported on 11/18/2022 10/01/22 12/30/22  Mercy Riding, MD  insulin glargine-yfgn (SEMGLEE) 100 UNIT/ML Pen  06/18/22   [provider]  metFORMIN (GLUCOPHAGE) 500 MG tablet Take 500 mg by mouth 3 (three) times daily. Patient not taking: Reported on 11/18/2022 10/10/22   [provider]  [facility] powder Apply topically. Patient not taking: Reported on 11/18/2022 10/29/22   [provider]  sodium chloride 1 g tablet Take 1 g by mouth every morning. Patient not taking: Reported on 11/18/2022 10/16/22  [provider]  terbinafine (LAMISIL) 250 MG tablet TAKE ONE TABLET BY MOUTH ONCE DAILY. Patient not taking: Reported on 11/18/2022 08/20/22   Lindell Spar, MD  triamcinolone cream (KENALOG) 0.1 % Apply 1 Application topically 2 (two) times daily. Patient not taking: Reported on 11/18/2022    [provider]  vitamin B-12 (VITAMIN B12) 500 MCG tablet Take 3 tablets (1,500 mcg total) by mouth daily. Patient not taking: Reported on 11/18/2022 10/02/22   Mercy Riding, MD    Physical Exam: Vitals:   11/18/22 1545 11/18/22 1600 11/18/22 1605 11/18/22 1658  BP: (!) 94/52 (!) 89/64 (!) 86/53   Pulse: 96 89 95   Resp: 20 16 17   $ Temp:    98.6 F (37 C)  TempSrc:    Oral  SpO2: 97% 97% 96%   Weight:      Height:        Constitutional: NAD, calm, comfortable Vitals:   11/18/22 1545 11/18/22 1600 11/18/22 1605 11/18/22 1658  BP: (!) 94/52 (!) 89/64 (!) 86/53   Pulse: 96 89 95   Resp: 20 16 17   $ Temp:    98.6 F (37 C)  TempSrc:    Oral  SpO2: 97% 97% 96%   Weight:      Height:       Eyes: PERRL, lids and conjunctivae normal ENMT: Mucous membranes are very dry.  Neck: normal, supple, no masses, no thyromegaly Respiratory: clear to auscultation bilaterally, no wheezing, no crackles. Normal respiratory effort. No accessory muscle use.  Cardiovascular: Tachycardiac, irregular rate and rhythm, no murmurs / rubs / gallops. No extremity edema.  Abdomen: no tenderness, no masses palpated. No hepatosplenomegaly. Bowel sounds positive.  Musculoskeletal: no clubbing / cyanosis. No joint deformity upper and lower extremities.  Skin: Chronic stasis changes to bilateral lower extremity , no ulcers. No induration Neurologic: No facial asymmetry, speech  clear without aphasia, 4+5 strength in all extremities Psychiatric:  Alert and oriented x person and place, he knows he is at one of the [facility] facilities but was not told which one he was taken to. Normal mood.   Labs on Admission: I have personally reviewed following labs and imaging studies  CBC: Recent Labs  Lab 11/18/22 1118  WBC 7.9  NEUTROABS 6.7  HGB 9.5*  HCT 30.1*  MCV 87.8  PLT [id number]   Basic Metabolic Panel: Recent Labs  Lab 11/18/22 1118  NA 133*  K 5.8*  CL 106  CO2 20*  GLUCOSE 105*  BUN 91*  CREATININE 3.07*  CALCIUM 8.5*  MG 2.5*   GFR: Estimated Creatinine Clearance: 17.4 mL/min (A) (by C-G formula based on SCr of 3.07 mg/dL (H)). Liver Function Tests: Recent Labs  Lab 11/18/22 1118  AST 22  ALT 21  ALKPHOS 113  BILITOT 1.2  PROT 5.7*  ALBUMIN 2.1*   No results for input(s): "LIPASE", "AMYLASE" in the last 168 hours. Recent Labs  Lab 11/18/22 1118  AMMONIA 14   Coagulation Profile: Recent Labs  Lab 11/18/22 1118  INR 2.9*   Urine analysis:    Component Value Date/Time   COLORURINE YELLOW 11/18/2022 1337   APPEARANCEUR HAZY (A) 11/18/2022 1337   APPEARANCEUR Clear 12/21/2021 1054   LABSPEC 1.009 11/18/2022 1337   PHURINE 5.0 11/18/2022 1337   GLUCOSEU NEGATIVE 11/18/2022 1337   HGBUR LARGE (A) 11/18/2022 1337   BILIRUBINUR NEGATIVE 11/18/2022 1337   BILIRUBINUR negative 04/25/2022 0857   BILIRUBINUR Negative 12/21/2021 1054   KETONESUR NEGATIVE  11/18/2022 1337   PROTEINUR NEGATIVE 11/18/2022 1337   UROBILINOGEN 0.2 04/25/2022 0857   UROBILINOGEN 0.2 08/24/2013 1129   NITRITE POSITIVE (A) 11/18/2022 1337   LEUKOCYTESUR LARGE (A) 11/18/2022 1337    Radiological Exams on Admission: DG Chest Port 1 View  Result Date: 11/18/2022 CLINICAL DATA:  Altered mental status EXAM: PORTABLE CHEST 1 VIEW COMPARISON:  09/21/2022 x-ray and older.  CT angiogram 09/23/2022 FINDINGS: The heart size and mediastinal contours are within normal  limits. Both lungs are clear. The visualized skeletal structures are unremarkable. Calcified aorta. Overlapping gown snaps. Lordotic x-ray. IMPRESSION: No active disease. Electronically Signed   By: Jill Side M.D.   On: 11/18/2022 12:23    EKG: Independently reviewed.  Sinus rhythm, rate 81, QTc 452.  No significant ST or T wave changes from prior.  Assessment/Plan Principal Problem:   Severe sepsis (HCC) Active Problems:   UTI (urinary tract infection)   AKI (acute kidney injury) ([place])   Acute metabolic encephalopathy   NASH (nonalcoholic steatohepatitis)   Acute anemia   Hypothyroidism   Chronic combined systolic and diastolic heart failure (HCC)   Chronic obstructive lung disease (HCC)   Diabetic peripheral neuropathy (HCC)   Gout   Tonic-clonic seizure (HCC)   Paroxysmal atrial fibrillation with RVR (HCC)  Assessment and Plan: * Severe sepsis (HCC) Hypotensive, later became tachycardic to 150s, initial hypothermia temperature down to 96.6, while on Bair hugger's increased to 100.4 with shaking chills.  With evidence of endorgan dysfunction AKI.  Lactic acid 0.9.  Blood pressure down to 62/22, improved -  Status post 1 unit PRBC, 1.5 L bolus.  Hypotension likely from sepsis and dehydration.  He appears very dehydrated, with AKI.  Staff reports poor oral intake about a week ago.  No GI losses.  At baseline he has hypertension. - Admit to stepdown - 3rd L bolus ordered, continue N/s 100cc/hr x 15hrs - D/c Bair Huggers -Ceftriaxone 2 g daily - Addendum-patient now with septic shock, blood pressure initially improved to 0000000 systolic, now down to 0000000.  Total of 3 L bolus given.  Cardizem drip discontinued.  Heart rate low 100s. Started on Levophed via peripheral.  Foley catheter.  Continue maintenance fluids.  Will broaden antibiotic-continue IV ceftriaxone 2 g daily, add IV vancomycin and metronidazole   Acute metabolic encephalopathy Mental status initially intact, but with spike  in temperature and tachycardia, patient became agitated, pulling at outlines. -Ativan 1 mg x 1 -IV antibiotics, IV fluids  AKI (acute kidney injury) ([place]) Creatinine elevated at 3.07.  Normal baseline renal function creatinine 0.6 - 1.  Patient appears dehydrated, reported poor oral intake a week ago, home medications include Entresto, Bumex, metolazone. -Hydrate -Hold Entresto, Bumex, metolazone  UTI (urinary tract infection) Reports urinary symptoms, UA suggestive of UTI.  Presenting with hypotension, hypothermia, normal lactic acid 0.9.   While on bair huggers temperature increased to Tmax of 100.4.  Last urine culture 03/2022, pansensitive, Klebsiella. - Follow-up blood cultures  -Add on urine cultures -IV Rocephin  Paroxysmal atrial fibrillation with RVR (HCC) Heart rate initially 80s spikes to 150.  On anticoagulation with Eliquis.  He is now reporting some chest pain. -Check EKG-confirms atrial fibrillation, several artifacts, subsequent EKG shows T wave inversions in leads V5 and V6, as he is severely septic, will trend troponin for now. - Hold Eliquis for now with acute anemia -IV Cardizem 10 mg, with Cardizem drip.  Tonic-clonic seizure ([place]) Resume Keppra  Gout Resume allopurinol  Diabetic peripheral  neuropathy ([place]) - SSI- S -Hold home long-acting insulin, metformin - HgbA1c  Chronic combined systolic and diastolic heart failure (HCC) Stable and compensated, appears hypovolemic.  08/2022, EF of 50 to 55%. -Hold Entresto, metolazone, Bumex, carvedilol  Hypothyroidism Resume Synthroid.  Acute anemia Hemoglobin 9.5, stool occult positive- brown stool without blood, baseline hemoglobin over the past 2 months 12-14.  He is on anticoagulation with Eliquis.  He denies melena, hematemesis or hematochezia.  -Due to concerns for GI bleed as etiology for hypotension, patient was given 1 unit PRBC in ED -Hold Eliquis and aspirin for now -Trend hemoglobin -IV Protonix 40  daily   DVT prophylaxis: SCDS Code Status: DNR- Universal DNR form at bedside. Family Communication: I Called patient's nieces- Nathaneil Canary [name] listed as POA and Laren Boom, both listed as [place] in demographics, no response from both, got voicemail.  Did not leave message. Disposition Plan: >2 days Consults called: None  Admission status: Inpt Stepdown I certify that at the point of admission it is my clinical judgment that the patient will require inpatient hospital care spanning beyond 2 midnights from the point of admission due to high intensity of service, high risk for further deterioration and high frequency of surveillance required.   CRITICAL CARE Performed by: Bethena Roys   Total critical care time: 70 minutes  Critical care time was exclusive of separately billable procedures and treating other patients.  Critical care was necessary to treat or prevent imminent or life-threatening deterioration.  Critical care was time spent personally by me on the following activities: development of treatment plan with patient and/or surrogate as well as nursing, discussions with consultants, evaluation of patient's response to treatment, examination of patient, obtaining history from patient or surrogate, ordering and performing treatments and interventions, ordering and review of laboratory studies, ordering and review of radiographic studies, pulse oximetry and re-evaluation of patient's condition.    Author: Bethena Roys, MD 11/18/2022 9:03 PM  For on call review [email or web address].

## 2022-11-18 NOTE — ED Notes (Signed)
Xray at BS 

## 2022-11-18 NOTE — Assessment & Plan Note (Signed)
Resume Keppra 

## 2022-11-18 NOTE — ED Notes (Signed)
Zoll pads applied, HR 150

## 2022-11-18 NOTE — Assessment & Plan Note (Signed)
-   SSI- S -Hold home long-acting insulin, metformin - HgbA1c

## 2022-11-18 NOTE — Assessment & Plan Note (Signed)
Resume allopurinol 

## 2022-11-18 NOTE — Assessment & Plan Note (Signed)
Mental status initially intact, but with spike in temperature and tachycardia, patient became agitated, pulling at outlines. -Ativan 1 mg x 1 -IV antibiotics, IV fluids

## 2022-11-18 NOTE — ED Notes (Signed)
EDP at BS 

## 2022-11-18 NOTE — Assessment & Plan Note (Addendum)
Hypotensive, later became tachycardic to 150s, initial hypothermia temperature down to 96.6, while on Bair hugger's increased to 100.4 with shaking chills.  With evidence of endorgan dysfunction AKI.  Lactic acid 0.9.  Blood pressure down to 62/22, improved -  Status post 1 unit PRBC, 1.5 L bolus.  Hypotension likely from sepsis and dehydration.  He appears very dehydrated, with AKI.  Staff reports poor oral intake about a week ago.  No GI losses.  At baseline he has hypertension. - Admit to stepdown - 3rd L bolus ordered, continue N/s 100cc/hr x 15hrs - D/c Bair Huggers -Ceftriaxone 2 g daily - Addendum-patient now with septic shock, blood pressure initially improved to 0000000 systolic, now down to 0000000.  Total of 3 L bolus given.  Cardizem drip discontinued.  Heart rate low 100s. Started on Levophed via peripheral.  Foley catheter.  Continue maintenance fluids.  Will broaden antibiotic-continue IV ceftriaxone 2 g daily, add IV vancomycin and metronidazole

## 2022-11-18 NOTE — Progress Notes (Signed)
Pharmacy Antibiotic Note  Michael [name] is a 77 y.o. male admitted on 11/18/2022 with AMS, pain with urination, hypotension, tachcardia.  Pharmacy has been consulted for Vancomycin dosing for sepsis - possible urinary source.  Patient also on Rocephin and Flagyl.  Also noted to have AKI with SCr ~ 3, baseline ~ 1 (Dec 2023).   Plan: Vancomycin 1541m IV x 1 dose Follow-up SCr and clinical progress. Further dosing based on levels. Rx will continue to follow.  Height: 5' 7"$  (170.2 cm) Weight: 76.1 kg (167 lb 12.3 oz) IBW/kg (Calculated) : 66.1  Temp (24hrs), Avg:98.3 F (36.8 C), Min:96.6 F (35.9 C), Max:100.4 F (38 C)  Recent Labs  Lab 11/18/22 1118  WBC 7.9  CREATININE 3.07*  LATICACIDVEN 0.9    Estimated Creatinine Clearance: 19.4 mL/min (A) (by C-G formula based on SCr of 3.07 mg/dL (H)).    No Known Allergies  Antimicrobials this admission: Vanc 2/19 >>  Rocephin 2/19 >>  Flagyl 2/19 >>  Dose adjustments this admission:   Microbiology results: 2/19 BCx: pending 2/19 UCx: pending    Thank you for allowing pharmacy to be a part of this patient's care.  HElmer Ramp2/19/2024 9:31 PM

## 2022-11-18 NOTE — ED Notes (Signed)
EDP updated on hemoccult positive, hgb, creatinine, potassium. Pt unable to provide urine sample at this time.

## 2022-11-18 NOTE — Assessment & Plan Note (Addendum)
Creatinine elevated at 3.07.  Normal baseline renal function creatinine 0.6 - 1.  Patient appears dehydrated, reported poor oral intake a week ago, home medications include Entresto, Bumex, metolazone. -Hydrate -Hold Entresto, Bumex, metolazone

## 2022-11-18 NOTE — Assessment & Plan Note (Signed)
Resume Synthroid.

## 2022-11-18 NOTE — Assessment & Plan Note (Deleted)
Blood pressure down to 62/22, improved now low [id number] systolic.  Status post 1 unit PRBC, 1.5 L bolus.  Hypotension likely from dehydration, possibly UTI.  Hypotensive, with hypothermia temperature down to 96.6. He appears very dehydrated, with AKI.  Staff reports poor oral intake about a week ago.  No GI losses.  At baseline he has hypertension. - Admit to stepdown - 3rd L bolus ordered, continue N/s 100cc/hr x 15hrs -Continue Bair Huggers

## 2022-11-18 NOTE — ED Triage Notes (Signed)
Pt arrived [facility] from [facility] with c/o AMS x 3days. Pt was hypotensive upon arrival 70/40. 20 G in left AC and 500 bolus of NS given. Pt can stand and pivot.

## 2022-11-19 ENCOUNTER — Inpatient Hospital Stay (HOSPITAL_COMMUNITY): Payer: No Typology Code available for payment source

## 2022-11-19 ENCOUNTER — Encounter (HOSPITAL_COMMUNITY): Payer: Self-pay | Admitting: Internal Medicine

## 2022-11-19 DIAGNOSIS — Z7189 Other specified counseling: Secondary | ICD-10-CM | POA: Diagnosis not present

## 2022-11-19 DIAGNOSIS — Z515 Encounter for palliative care: Secondary | ICD-10-CM

## 2022-11-19 DIAGNOSIS — A419 Sepsis, unspecified organism: Secondary | ICD-10-CM | POA: Diagnosis not present

## 2022-11-19 DIAGNOSIS — R6520 Severe sepsis without septic shock: Secondary | ICD-10-CM | POA: Diagnosis not present

## 2022-11-19 LAB — BASIC METABOLIC PANEL
Anion gap: 11 (ref 5–15)
BUN: 66 mg/dL — ABNORMAL HIGH (ref 8–23)
CO2: 13 mmol/L — ABNORMAL LOW (ref 22–32)
Calcium: 7.5 mg/dL — ABNORMAL LOW (ref 8.9–10.3)
Chloride: 114 mmol/L — ABNORMAL HIGH (ref 98–111)
Creatinine, Ser: 2.05 mg/dL — ABNORMAL HIGH (ref 0.61–1.24)
GFR, Estimated: 33 mL/min — ABNORMAL LOW (ref >60)
Glucose, Bld: 136 mg/dL — ABNORMAL HIGH (ref 70–99)
Potassium: 4.8 mmol/L (ref 3.5–5.1)
Sodium: 138 mmol/L (ref 135–145)

## 2022-11-19 LAB — TYPE AND SCREEN
ABO/RH(D): A POS
Antibody Screen: NEGATIVE
Unit division: 0

## 2022-11-19 LAB — CBC
HCT: 31.7 % — ABNORMAL LOW (ref 39.0–52.0)
HCT: 33.2 % — ABNORMAL LOW (ref 39.0–52.0)
Hemoglobin: 10.6 g/dL — ABNORMAL LOW (ref 13.0–17.0)
Hemoglobin: 10.9 g/dL — ABNORMAL LOW (ref 13.0–17.0)
MCH: 28.5 pg (ref 26.0–34.0)
MCH: 29 pg (ref 26.0–34.0)
MCHC: 32.8 g/dL (ref 30.0–36.0)
MCHC: 33.4 g/dL (ref 30.0–36.0)
MCV: 86.6 fL (ref 80.0–100.0)
MCV: 86.7 fL (ref 80.0–100.0)
Platelets: 261 10*3/uL (ref 150–400)
Platelets: 270 10*3/uL (ref 150–400)
RBC: 3.66 MIL/uL — ABNORMAL LOW (ref 4.22–5.81)
RBC: 3.83 MIL/uL — ABNORMAL LOW (ref 4.22–5.81)
RDW: 16.9 % — ABNORMAL HIGH (ref 11.5–15.5)
RDW: 16.9 % — ABNORMAL HIGH (ref 11.5–15.5)
WBC: 10.7 10*3/uL — ABNORMAL HIGH (ref 4.0–10.5)
WBC: 8.8 10*3/uL (ref 4.0–10.5)
nRBC: 0 % (ref 0.0–0.2)
nRBC: 0 % (ref 0.0–0.2)

## 2022-11-19 LAB — GLUCOSE, CAPILLARY
Glucose-Capillary: 147 mg/dL — ABNORMAL HIGH (ref 70–99)
Glucose-Capillary: 152 mg/dL — ABNORMAL HIGH (ref 70–99)
Glucose-Capillary: 161 mg/dL — ABNORMAL HIGH (ref 70–99)
Glucose-Capillary: 188 mg/dL — ABNORMAL HIGH (ref 70–99)
Glucose-Capillary: 99 mg/dL (ref 70–99)

## 2022-11-19 LAB — COMPREHENSIVE METABOLIC PANEL
ALT: 19 U/L (ref 0–44)
AST: 23 U/L (ref 15–41)
Albumin: 1.7 g/dL — ABNORMAL LOW (ref 3.5–5.0)
Alkaline Phosphatase: 92 U/L (ref 38–126)
Anion gap: 9 (ref 5–15)
BUN: 77 mg/dL — ABNORMAL HIGH (ref 8–23)
CO2: 16 mmol/L — ABNORMAL LOW (ref 22–32)
Calcium: 7.7 mg/dL — ABNORMAL LOW (ref 8.9–10.3)
Chloride: 110 mmol/L (ref 98–111)
Creatinine, Ser: 2.36 mg/dL — ABNORMAL HIGH (ref 0.61–1.24)
GFR, Estimated: 28 mL/min — ABNORMAL LOW (ref >60)
Glucose, Bld: 108 mg/dL — ABNORMAL HIGH (ref 70–99)
Potassium: 5.3 mmol/L — ABNORMAL HIGH (ref 3.5–5.1)
Sodium: 135 mmol/L (ref 135–145)
Total Bilirubin: 1.1 mg/dL (ref 0.3–1.2)
Total Protein: 4.9 g/dL — ABNORMAL LOW (ref 6.5–8.1)

## 2022-11-19 LAB — TROPONIN I (HIGH SENSITIVITY)
Troponin I (High Sensitivity): 279 ng/L (ref <18)
Troponin I (High Sensitivity): 192 ng/L (ref <18)

## 2022-11-19 LAB — BPAM RBC
Blood Product Expiration Date: 202403102359
ISSUE DATE / TIME: 202402191430
Unit Type and Rh: 6200

## 2022-11-19 LAB — MRSA NEXT GEN BY PCR, NASAL: MRSA by PCR Next Gen: DETECTED — AB

## 2022-11-19 LAB — LACTIC ACID, PLASMA: Lactic Acid, Venous: 0.7 mmol/L (ref 0.5–1.9)

## 2022-11-19 MED ORDER — CHLORHEXIDINE GLUCONATE CLOTH 2 % EX PADS
6.0000 | MEDICATED_PAD | Freq: Every day | CUTANEOUS | Status: DC
  Administered 2022-11-19 – 2022-11-21 (×4): 6 via TOPICAL

## 2022-11-19 MED ORDER — HYDROCORTISONE SOD SUC (PF) 100 MG IJ SOLR
100.0000 mg | Freq: Once | INTRAMUSCULAR | Status: AC
  Administered 2022-11-19: 100 mg via INTRAVENOUS
  Filled 2022-11-19: qty 2

## 2022-11-19 MED ORDER — VANCOMYCIN HCL 1500 MG/300ML IV SOLN: 1500.0000 mg | INTRAVENOUS | Status: DC

## 2022-11-19 MED ORDER — CHLORHEXIDINE GLUCONATE CLOTH 2 % EX PADS
6.0000 | MEDICATED_PAD | Freq: Every day | CUTANEOUS | Status: DC
  Administered 2022-11-20 – 2022-11-21 (×2): 6 via TOPICAL

## 2022-11-19 MED ORDER — NOREPINEPHRINE 4 MG/250ML-% IV SOLN
0.0000 ug/min | INTRAVENOUS | Status: DC
  Administered 2022-11-19: 10 ug/min via INTRAVENOUS
  Filled 2022-11-19 (×2): qty 250

## 2022-11-19 MED ORDER — HYDROCORTISONE SOD SUC (PF) 100 MG IJ SOLR
100.0000 mg | Freq: Two times a day (BID) | INTRAMUSCULAR | Status: DC
  Administered 2022-11-19 – 2022-11-20 (×3): 100 mg via INTRAVENOUS
  Filled 2022-11-19 (×3): qty 2

## 2022-11-19 MED ORDER — MUPIROCIN 2 % EX OINT
1.0000 | TOPICAL_OINTMENT | Freq: Two times a day (BID) | CUTANEOUS | Status: DC
  Administered 2022-11-19 – 2022-11-21 (×5): 1 via NASAL
  Filled 2022-11-19 (×2): qty 22

## 2022-11-19 MED ORDER — PHENYLEPHRINE HCL-NACL 20-0.9 MG/250ML-% IV SOLN
0.0000 ug/min | INTRAVENOUS | Status: DC
  Administered 2022-11-19: 115 ug/min via INTRAVENOUS
  Administered 2022-11-19: 135 ug/min via INTRAVENOUS
  Filled 2022-11-19 (×2): qty 250

## 2022-11-19 MED ORDER — METOPROLOL TARTRATE 25 MG PO TABS
12.5000 mg | ORAL_TABLET | Freq: Two times a day (BID) | ORAL | Status: DC
  Administered 2022-11-19 – 2022-11-20 (×3): 12.5 mg via ORAL
  Filled 2022-11-19 (×3): qty 1

## 2022-11-19 NOTE — Progress Notes (Signed)
Date and time results received: 11/19/22 1415 (use smartphrase ".now" to insert current time)  Test: MRSA PCR Critical Value: Positive  Name of Provider Notified: Manuella Ghazi. MD  Orders Received? Or Actions Taken?:  Standing orders placed.

## 2022-11-19 NOTE — Progress Notes (Signed)
Patient continues to be hypotensive despite 62mg levophed in peripheral line. I have ordered neo. I spoke with niece who is an agreement with central line (she is listed as POA). Patient is altered, and not able to make this decision himself at this point. We will also add stress dose steroids as recommended by [facility]. Ordering cbc, cmp, lactate at this time. Will continue to follow.

## 2022-11-19 NOTE — Progress Notes (Signed)
CBG=194 PER METER

## 2022-11-19 NOTE — Plan of Care (Signed)
  Problem: Education: Goal: Ability to describe self-care measures that may prevent or decrease complications (Diabetes Survival Skills Education) will improve Outcome: Progressing   

## 2022-11-19 NOTE — Progress Notes (Signed)
PROGRESS NOTE    Michael [name]  [id number] DOB: 10/24/1945 DOA: 11/18/2022 PCP: [facility]   Brief Narrative:    Michael [name] is a 77 y.o. male with medical history significant for atrial fibrillation on chronic anticoagulation, hypertension, gout, diabetes mellitus, COPD, CHF, [place]. Patient was brought to the ED from nursing home via EMS reports of altered mental status.  He was admitted with acute metabolic encephalopathy as well as AKI in the setting of septic shock secondary to UTI.  He was empirically started on Rocephin, vancomycin, and metronidazole with cultures pending.    Assessment & Plan:   Principal Problem:   Severe sepsis ([place]) Active Problems:   UTI (urinary tract infection)   AKI (acute kidney injury) ([place])   Acute metabolic encephalopathy   NASH (nonalcoholic steatohepatitis)   Acute anemia   Hypothyroidism   Chronic combined systolic and diastolic heart failure (HCC)   Chronic obstructive lung disease (HCC)   Diabetic peripheral neuropathy (HCC)   Gout   Tonic-clonic seizure (HCC)   Paroxysmal atrial fibrillation with RVR (HCC)  Assessment and Plan:   Acute metabolic encephalopathy secondary to septic shock Continue IV antibiotics as ordered Continue IV fluids Mentation appears to be improving Follow blood and urine cultures Continue stress dose hydrocortisone until weaned off of pressors  AKI (acute kidney injury) (HCC)-secondary to above Creatinine elevated at 3.07.  Normal baseline renal function creatinine 0.6 - 1.  Patient appears dehydrated, reported poor oral intake a week ago, home medications include Entresto, Bumex, metolazone. -Hydrate -Hold Entresto, Bumex, metolazone -Appears to be improving, continue to monitor   UTI (urinary tract infection) Reports urinary symptoms, UA suggestive of UTI.  Presenting with hypotension, hypothermia, normal lactic acid 0.9.   While on bair huggers temperature increased to Tmax of  100.4.  Last urine culture 03/2022, pansensitive, Klebsiella. - Follow-up blood cultures with no growth to date -Add on urine cultures -IV Rocephin   Paroxysmal atrial fibrillation with RVR (HCC)-resolved Heart rate initially 80s spikes to 150.  On anticoagulation with Eliquis.  He is now reporting some chest pain. -Check EKG-confirms atrial fibrillation, several artifacts, subsequent EKG shows T wave inversions in leads V5 and V6, as he is severely septic, will trend troponin for now. - Hold Eliquis for now with acute anemia -Patient off of Cardizem drip -Continue close monitoring on telemetry   Tonic-clonic seizure ([place]) Resume Keppra   Gout Resume allopurinol   Diabetic peripheral neuropathy (HCC) - SSI- S -Hold home long-acting insulin, metformin - HgbA1c   Chronic combined systolic and diastolic heart failure (HCC) Stable and compensated, appears hypovolemic.  08/2022, EF of 50 to 55%. -Hold Entresto, metolazone, Bumex, carvedilol   Hypothyroidism Resume Synthroid. TSH 3.8   Acute anemia-stable Hemoglobin 9.5, stool occult positive- brown stool without blood, baseline hemoglobin over the past 2 months 12-14.  He is on anticoagulation with Eliquis.  He denies melena, hematemesis or hematochezia.  -Due to concerns for GI bleed as etiology for hypotension, patient was given 1 unit PRBC in ED -Hold Eliquis and aspirin for now -Trend hemoglobin -IV Protonix 40 daily   DVT prophylaxis:SCDs Code Status: DNR Family Communication: Niece, POA, bedside 2/20 Disposition Plan:  Status is: Inpatient Remains inpatient appropriate because: Need for ongoing IV fluid and medications.   Consultants:  Palliative care  Procedures:  Right IJ central venous line placement 2/20  Antimicrobials:  Anti-infectives (From admission, onward)    Start     Dose/Rate Route Frequency Ordered Stop  11/21/22 0000  vancomycin (VANCOREADY) IVPB 1500 mg/300 mL        1,500 mg 150 mL/hr over  120 Minutes Intravenous Every 48 hours 11/19/22 1059     11/19/22 1800  cefTRIAXone (ROCEPHIN) 2 g in sodium chloride 0.9 % 100 mL IVPB        2 g 200 mL/hr over 30 Minutes Intravenous Every 24 hours 11/18/22 2059     11/18/22 2230  vancomycin (VANCOREADY) IVPB 1500 mg/300 mL        1,500 mg 150 mL/hr over 120 Minutes Intravenous  Once 11/18/22 2134 11/19/22 0102   11/18/22 2200  metroNIDAZOLE (FLAGYL) IVPB 500 mg        500 mg 100 mL/hr over 60 Minutes Intravenous Every 12 hours 11/18/22 2059     11/18/22 2134  vancomycin variable dose per unstable renal function (pharmacist dosing)         Does not apply See admin instructions 11/18/22 2134     11/18/22 2130  cefTRIAXone (ROCEPHIN) 1 g in sodium chloride 0.9 % 100 mL IVPB        1 g 200 mL/hr over 30 Minutes Intravenous  Once 11/18/22 2059 11/18/22 2307   11/18/22 1800  cefTRIAXone (ROCEPHIN) 1 g in sodium chloride 0.9 % 100 mL IVPB  Status:  Discontinued        1 g 200 mL/hr over 30 Minutes Intravenous Every 24 hours 11/18/22 1706 11/18/22 2059      Subjective: Patient seen and evaluated today with no new acute complaints or concerns. No acute concerns or events noted overnight.  Blood pressure appears to be improving this morning and he is alert and awake.  Objective: Vitals:   11/19/22 0750 11/19/22 0755 11/19/22 0800 11/19/22 1100  BP: (!) 113/96  (!) 115/49   Pulse: 83 83 80   Resp: 11 13 20   $ Temp:    97.7 F (36.5 C)  TempSrc:    Oral  SpO2: 98% 99% 99%   Weight:      Height:        Intake/Output Summary (Last 24 hours) at 11/19/2022 1154 Last data filed at 11/19/2022 [id number] Gross per 24 hour  Intake 6497.75 ml  Output 1300 ml  Net 5197.75 ml   Filed Weights   11/18/22 1102 11/18/22 1857 11/19/22 0500  Weight: 76.7 kg 76.1 kg 76.8 kg    Examination:  General exam: Appears calm and comfortable  Respiratory system: Clear to auscultation. Respiratory effort normal. Cardiovascular system: S1 & S2 heard, RRR.   Gastrointestinal system: Abdomen is soft Central nervous system: Alert and awake Extremities: No edema Skin: No significant lesions noted Psychiatry: Flat affect.    Data Reviewed: I have personally reviewed following labs and imaging studies  CBC: Recent Labs  Lab 11/18/22 1118 11/19/22 0024 11/19/22 0421  WBC 7.9 8.8 10.7*  NEUTROABS 6.7  --   --   HGB 9.5* 10.6* 10.9*  HCT 30.1* 31.7* 33.2*  MCV 87.8 86.6 86.7  PLT 290 261 [id number]   Basic Metabolic Panel: Recent Labs  Lab 11/18/22 1118 11/19/22 0024 11/19/22 0421  NA 133* 135 138  K 5.8* 5.3* 4.8  CL 106 110 114*  CO2 20* 16* 13*  GLUCOSE 105* 108* 136*  BUN 91* 77* 66*  CREATININE 3.07* 2.36* 2.05*  CALCIUM 8.5* 7.7* 7.5*  MG 2.5*  --   --    GFR: Estimated Creatinine Clearance: 29.1 mL/min (A) (by C-G formula based on SCr of 2.05 mg/dL (  H)). Liver Function Tests: Recent Labs  Lab 11/18/22 1118 11/19/22 0024  AST 22 23  ALT 21 19  ALKPHOS 113 92  BILITOT 1.2 1.1  PROT 5.7* 4.9*  ALBUMIN 2.1* 1.7*   No results for input(s): "LIPASE", "AMYLASE" in the last 168 hours. Recent Labs  Lab 11/18/22 1118  AMMONIA 14   Coagulation Profile: Recent Labs  Lab 11/18/22 1118  INR 2.9*   Cardiac Enzymes: No results for input(s): "CKTOTAL", "CKMB", "CKMBINDEX", "TROPONINI" in the last 168 hours. BNP (last 3 results) No results for input(s): "PROBNP" in the last 8760 hours. HbA1C: No results for input(s): "HGBA1C" in the last 72 hours. CBG: Recent Labs  Lab 11/18/22 1951 11/19/22 0007 11/19/22 0608 11/19/22 0754  GLUCAP 99 99 152* 147*   Lipid Profile: No results for input(s): "CHOL", "HDL", "LDLCALC", "TRIG", "CHOLHDL", "LDLDIRECT" in the last 72 hours. Thyroid Function Tests: Recent Labs    11/18/22 1715  TSH 3.824   Anemia Panel: No results for input(s): "VITAMINB12", "FOLATE", "FERRITIN", "TIBC", "IRON", "RETICCTPCT" in the last 72 hours. Sepsis Labs: Recent Labs  Lab 11/18/22 1118  11/19/22 0024  LATICACIDVEN 0.9 0.7    No results found for this or any previous visit (from the past 240 hour(s)).       Radiology Studies: DG Chest Port 1 View  Result Date: 11/19/2022 CLINICAL DATA:  Check central line placement EXAM: PORTABLE CHEST 1 VIEW COMPARISON:  11/18/2022 FINDINGS: Cardiac shadow is stable. Right jugular central line is noted catheter tip at cavoatrial junction. No evidence of pneumothorax is seen. Mild central vascular congestion is noted. No bony abnormality is seen. IMPRESSION: Central line in satisfactory position. No definitive pneumothorax is noted. Mild central vascular congestion. Electronically Signed   By: Inez Catalina M.D.   On: 11/19/2022 01:56   DG Chest Port 1 View  Result Date: 11/18/2022 CLINICAL DATA:  Altered mental status EXAM: PORTABLE CHEST 1 VIEW COMPARISON:  09/21/2022 x-ray and older.  CT angiogram 09/23/2022 FINDINGS: The heart size and mediastinal contours are within normal limits. Both lungs are clear. The visualized skeletal structures are unremarkable. Calcified aorta. Overlapping gown snaps. Lordotic x-ray. IMPRESSION: No active disease. Electronically Signed   By: Jill Side M.D.   On: 11/18/2022 12:23        Scheduled Meds:  allopurinol  100 mg Oral Daily   Chlorhexidine Gluconate Cloth  6 each Topical Daily   hydrocortisone sod succinate (SOLU-CORTEF) inj  100 mg Intravenous Q12H   insulin aspart  0-9 Units Subcutaneous Q6H   levothyroxine  100 mcg Oral QAC breakfast   pantoprazole (PROTONIX) IV  40 mg Intravenous Q24H   tamsulosin  0.4 mg Oral Daily   vancomycin variable dose per unstable renal function (pharmacist dosing)   Does not apply See admin instructions   Continuous Infusions:  sodium chloride 75 mL/hr at 11/19/22 0613   sodium chloride 20 mL/hr at 11/19/22 0613   sodium chloride Stopped (11/19/22 0003)   cefTRIAXone (ROCEPHIN)  IV     levETIRAcetam Stopped (11/19/22 0039)   metronidazole 500 mg (11/19/22  1022)   norepinephrine (LEVOPHED) Adult infusion 7 mcg/min (11/19/22 1011)   phenylephrine (NEO-SYNEPHRINE) Adult infusion Stopped (11/19/22 0528)   [START ON 11/21/2022] vancomycin       LOS: 1 day    Total critical care time: 55 minutes.    Paiden Cavell Darleen Crocker, DO [facility]  If 7PM-7AM, please contact night-coverage [email or web address] 11/19/2022, 11:54 AM

## 2022-11-19 NOTE — ED Provider Notes (Signed)
Asked by hospitalist service to evaluate patient.  Patient currently admitted in the ICU and is septic.  Patient on peripheral pressors, becoming more hypotensive and will need increased blood pressure support.  Physical Exam  BP (!) 96/48   Pulse 81   Temp 98.3 F (36.8 C) (Axillary)   Resp 15   Ht 5' 7"$  (1.702 m)   Wt 76.1 kg   SpO2 96%   BMI 26.28 kg/m   Physical Exam Constitutional:      Appearance: He is ill-appearing.  Eyes:     Pupils: Pupils are equal, round, and reactive to light.  Cardiovascular:     Rate and Rhythm: Normal rate and regular rhythm.  Pulmonary:     Effort: Pulmonary effort is normal.     Breath sounds: Normal breath sounds.  Abdominal:     Palpations: Abdomen is soft.  Musculoskeletal:        General: Normal range of motion.  Neurological:     General: No focal deficit present.     Procedures  .Central Line  Date/Time: 11/19/2022 6:35 AM  Performed by: Orpah Greek, MD Authorized by: Orpah Greek, MD   Consent:    Consent obtained:  Verbal   Consent given by:  Healthcare agent   Risks, benefits, and alternatives were discussed: yes   Universal protocol:    Procedure explained and questions answered to patient or proxy's satisfaction: yes     Relevant documents present and verified: yes     Test results available: yes     Imaging studies available: yes     Required blood products, implants, devices, and special equipment available: yes     Site/side marked: yes     Immediately prior to procedure, a time out was called: yes     Patient identity confirmed:  Hospital-assigned identification number Pre-procedure details:    Indication(s): central venous access and insufficient peripheral access     Hand hygiene: Hand hygiene performed prior to insertion     Sterile barrier technique: All elements of maximal sterile technique followed     Skin preparation:  Chlorhexidine   Skin preparation agent: Skin preparation agent  completely dried prior to procedure   Sedation:    Sedation type:  None Anesthesia:    Anesthesia method:  Local infiltration   Local anesthetic:  Lidocaine 1% w/o epi Procedure details:    Location:  R internal jugular   Patient position:  Trendelenburg   Procedural supplies:  Triple lumen   Catheter size:  7 Fr   Landmarks identified: yes     Ultrasound guidance: yes     Ultrasound guidance timing: real time     Sterile ultrasound techniques: Sterile gel and sterile probe covers were used     Number of attempts:  2   Successful placement: yes   Post-procedure details:    Post-procedure:  Dressing applied and line sutured   Assessment:  Blood return through all ports, no pneumothorax on x-ray, placement verified by x-ray and free fluid flow   Procedure completion:  Tolerated well, no immediate complications ARTERIAL LINE  Date/Time: 11/19/2022 6:36 AM  Performed by: Orpah Greek, MD Authorized by: Orpah Greek, MD   Consent:    Consent obtained:  Verbal   Consent given by:  Healthcare agent   Risks, benefits, and alternatives were discussed: yes   Universal protocol:    Procedure explained and questions answered to patient or proxy's satisfaction: yes  Relevant documents present and verified: yes     Test results available: yes     Imaging studies available: yes     Required blood products, implants, devices, and special equipment available: yes     Site/side marked: yes     Immediately prior to procedure, a time out was called: yes     Patient identity confirmed:  Hospital-assigned identification number Indications:    Indications: hemodynamic monitoring and multiple ABGs   Pre-procedure details:    Skin preparation:  Chlorhexidine   Preparation: Patient was prepped and draped in sterile fashion   Sedation:    Sedation type:  None Anesthesia:    Anesthesia method:  None Procedure details:    Location:  R radial   Allen's test performed: yes      Allen's test abnormal: no     Needle gauge:  20 G   Placement technique:  Seldinger   Number of attempts:  1   Transducer: waveform confirmed   Post-procedure details:    Post-procedure:  Biopatch applied, secured with tape and sterile dressing applied   CMS:  Normal   Procedure completion:  Tolerated well, no immediate complications   ED Course / MDM    Medical Decision Making Amount and/or Complexity of Data Reviewed Labs: ordered. Radiology: ordered.  Risk Prescription drug management. Decision regarding hospitalization.   Patient encountered in the ICU.  Patient currently being treated for sepsis.  Patient with decreasing blood pressures despite peripheral strength pressors.  Patient requires central line for pressors.  This was placed without difficulty.  An art line was also placed by myself for hemodynamic monitoring.  Patient maintained in the ICU under the care of the hospitalist service.       Orpah Greek, MD 11/19/22 [phone]

## 2022-11-19 NOTE — Consult Note (Signed)
Consultation Note Date: 11/19/2022   Patient Name: Michael [name]  DOB: [birth year]  MRN: [id number]  Age / Sex: 77 y.o., male  PCP: [facility] Referring Physician: Rodena Goldmann, DO  Reason for Consultation: Establishing goals of care  HPI/Patient Profile: 77 y.o. male  with past medical history of A-fib on chronic anticoagulation, HTN/HLD, IDA, chronic lymphedema bilateral lower extremities, gout, DM, COPD, heart failure, NASH, arthritis, DDD, GERD, admitted on 11/18/2022 with severe sepsis/acute metabolic encephalopathy secondary to septic shock, acute kidney injury with UTI.   Clinical Assessment and Goals of Care: I have reviewed medical records including EPIC notes, labs and imaging, received report from RN, assessed the patient.  Mr. Michael, [name], is resting quietly in bed.  He appears acutely/chronically ill and quite frail.  He is alert, but oriented to self only.  I believe that he can make his basic needs known.  His niece/HCPOA, Danford Bad, is present at bedside along with his brother-in-law Jenny Reichmann.  We meet at bedside to discuss diagnosis prognosis, GOC, EOL wishes, disposition and options.  I introduced Palliative Medicine as specialized medical care for people living with serious illness. It focuses on providing relief from the symptoms and stress of a serious illness. The goal is to improve quality of life for both the patient and the family.  We discussed a brief life review of the patient.  [name] shares that Michael [name] is a widower for several years now.  She shares that he never had children.  They share that he had been living independently until recently.  He fell, came to the hospital, went to short-term rehab and is now in assisted living.  [name]'s sister, [name]'s mother, has memory loss.  [name] is his healthcare power of attorney.  We then focused on their current illness.  We talk  in detail about Mr. Slemmer acute health concerns and the treatment plan.  We talk about time for outcomes.  [name] shares that quality of life is meaningful for Michael [name] and that they are ready if this is his end-of-life.  The natural disease trajectory and expectations at EOL were discussed.  I attempted to elicit values and goals of care important to the patient.  Patient and niece/HCPOA both endorse that Michael [name] is ready to pass away when his time comes.  They share his [name] faith.  [name] shares that independence is important to [name].  She shares understanding that he will continue to have declines every time he gets sick.  She shares that he would not want to live the rest of his life in a nursing home.   The difference between aggressive medical intervention and comfort care was considered in light of the patient's goals of care.  We talk about the concept of "let nature take its course.  At this point 24 to 48 hours for outcomes.  If no meaningful improvement anticipate transition to comfort care.  Advanced directives, concepts specific to code status, artifical feeding and hydration, and rehospitalization were considered and discussed.  DNR  verified.  At this point no escalation of care.    Hospice and Palliative Care services outpatient were explained and offered.  We talk about residential hospice care, to let nature take its course.  I shared that if he is able to recover from this illness, there is the concept of do not rehospitalize.  When he gets sick again, transitioning to residential hospice, not the hospital.  Discussed the importance of continued conversation with family and the medical providers regarding overall plan of care and treatment options, ensuring decisions are within the context of the patient's values and GOCs. Questions and concerns were addressed.   The family was encouraged to call with questions or concerns.  PMT will continue to support holistically.  Conference  with attending, bedside nursing staff, transition of care team related to patient condition, needs, goals of care, disposition.   HCPOA  HCPOA -niece, Danford Bad, states that she is legal healthcare power of attorney    SUMMARY OF RECOMMENDATIONS   At this point continue to treat the treatable but no CPR or intubation No escalation of care 24 to 48 hours for outcomes Considering comfort measures if no meaningful improvement   Code Status/Advance Care Planning: DNR  Symptom Management:  Per hospitalist, no additional needs at this time.  Palliative Prophylaxis:  Frequent Pain Assessment, Oral Care, and Turn Reposition  Additional Recommendations (Limitations, Scope, Preferences): Treat the treatable but no CPR or intubation  Psycho-social/Spiritual:  Desire for further Chaplaincy support:no Additional Recommendations: Caregiving  Support/Resources and Education on Hospice  Prognosis:  Unable to determine, based on outcomes.  Guarded.  24 to 48 hours.  Discharge Planning: To Be Determined      Primary Diagnoses: Present on Admission:  UTI (urinary tract infection)  Gout  Diabetic peripheral neuropathy (HCC)  Chronic obstructive lung disease (HCC)  Chronic combined systolic and diastolic heart failure (HCC)  Paroxysmal atrial fibrillation with RVR (HCC)  Hypothyroidism  NASH (nonalcoholic steatohepatitis)  AKI (acute kidney injury) ([place])  Acute anemia  Tonic-clonic seizure ([place])  Severe sepsis (HCC)  Acute metabolic encephalopathy   I have reviewed the medical record, interviewed the patient and family, and examined the patient. The following aspects are pertinent.  Past Medical History:  Diagnosis Date   Arthritis    Congestive heart disease ([place]) 11/2015   COPD (chronic obstructive pulmonary disease) (HCC)    DDD (degenerative disc disease), lumbosacral    Demand ischemia 09/23/2019   Depression    Diabetic neuropathy (HCC)    Diastolic dysfunction     Essential hypertension    GERD (gastroesophageal reflux disease)    Hyperlipidemia    Iron deficiency anemia    Left knee DJD    Lumbar spinal stenosis    Lymphedema 11/2015   BOTH LEGS   NASH (nonalcoholic steatohepatitis)    Peripheral edema    Peripheral edema 10/05/2015   Type 2 diabetes mellitus ([place])    Social History   Socioeconomic History   Marital status: Widowed    Spouse name: Not on file   Number of children: Not on file   Years of education: 12   Highest education level: Not on file  Occupational History   Not on file  Tobacco Use   Smoking status: Former    Packs/day: 2.50    Years: 44.00    Total pack years: 110.00    Types: Cigarettes    Start date: 05/07/1955    Quit date: 10/01/1999    Years since quitting:  23.1   Smokeless tobacco: Never  Vaping Use   Vaping Use: Never used  Substance and Sexual Activity   Alcohol use: No    Alcohol/week: 0.0 standard drinks of alcohol   Drug use: No   Sexual activity: Yes    Birth control/protection: None  Other Topics Concern   Not on file  Social History Narrative   Not on file   Social Determinants of Health   Financial Resource Strain: Low Risk  (04/18/2022)   Overall Financial Resource Strain (CARDIA)    Difficulty of Paying Living Expenses: Not hard at all  Food Insecurity: No Food Insecurity (09/19/2022)   Hunger Vital Sign    Worried About Running Out of Food in the Last Year: Never true    Ran Out of Food in the Last Year: Never true  Transportation Needs: No Transportation Needs (09/19/2022)   PRAPARE - Hydrologist (Medical): No    Lack of Transportation (Non-Medical): No  Physical Activity: Inactive (04/18/2022)   Exercise Vital Sign    Days of Exercise per Week: 0 days    Minutes of Exercise per Session: 0 min  Stress: No Stress Concern Present (04/18/2022)   [place]    Feeling of Stress : Not at  all  Social Connections: Moderately Isolated (04/18/2022)   Social Connection and Isolation Panel [NHANES]    Frequency of Communication with Friends and Family: More than three times a week    Frequency of Social Gatherings with Friends and Family: More than three times a week    Attends Religious Services: 1 to 4 times per year    Active Member of [employer] or Organizations: No    Attends Archivist Meetings: Never    Marital Status: Widowed   Family History  Problem Relation Age of Onset   Heart disease Mother    Hyperlipidemia Mother    Hypertension Mother    Depression Mother    Diabetes Mother    Rectal cancer Mother    Cancer Mother    Heart disease Father    Hypertension Father    Hyperlipidemia Father    Diabetes Father    Cancer Father    Heart disease Sister    Hyperlipidemia Sister    Hypertension Sister    Diabetes Sister    Diabetes Brother    Heart disease Sister    Hyperlipidemia Sister    Hypertension Sister    Heart disease Sister    Hyperlipidemia Sister    Hypertension Sister    Diabetes Sister    Diabetes Brother    Scheduled Meds:  allopurinol  100 mg Oral Daily   Chlorhexidine Gluconate Cloth  6 each Topical Daily   hydrocortisone sod succinate (SOLU-CORTEF) inj  100 mg Intravenous Q12H   insulin aspart  0-9 Units Subcutaneous Q6H   levothyroxine  100 mcg Oral QAC breakfast   metoprolol tartrate  12.5 mg Oral BID   pantoprazole (PROTONIX) IV  40 mg Intravenous Q24H   tamsulosin  0.4 mg Oral Daily   vancomycin variable dose per unstable renal function (pharmacist dosing)   Does not apply See admin instructions   Continuous Infusions:  sodium chloride 75 mL/hr at 11/19/22 0613   sodium chloride 20 mL/hr at 11/19/22 0613   sodium chloride Stopped (11/19/22 0003)   cefTRIAXone (ROCEPHIN)  IV     levETIRAcetam 1,000 mg (11/19/22 1252)   metronidazole 500 mg (11/19/22  1022)   norepinephrine (LEVOPHED) Adult infusion 6 mcg/min (11/19/22  1347)   phenylephrine (NEO-SYNEPHRINE) Adult infusion Stopped (11/19/22 0528)   [START ON 11/21/2022] vancomycin     PRN Meds:.acetaminophen **OR** acetaminophen, ondansetron **OR** ondansetron (ZOFRAN) IV, polyethylene glycol Medications Prior to Admission:  Prior to Admission medications   Medication Sig Start Date End Date Taking? Authorizing Provider  acetaminophen (TYLENOL) 325 MG tablet Take 2 tablets (650 mg total) by mouth every 6 (six) hours as needed. 10/01/22 10/01/23 Yes Mercy Riding, MD  allopurinol (ZYLOPRIM) 100 MG tablet Take 100 mg by mouth daily.   Yes [provider]  apixaban (ELIQUIS) 5 MG TABS tablet Take 1 tablet (5 mg total) by mouth 2 (two) times daily. 10/01/22  Yes Mercy Riding, MD  aspirin 81 MG chewable tablet Chew 81 mg by mouth daily.   Yes [provider]  bumetanide (BUMEX) 1 MG tablet Take 1 mg by mouth See admin instructions. Take 3 tablets (23m) by mouth daily 11/05/22  Yes [provider]  carvedilol (COREG) 12.5 MG tablet Take 12.5 mg by mouth 2 (two) times daily. 11/12/22  Yes [provider]  Cholecalciferol (VITAMIN D3) 10 MCG (400 UNIT) tablet Take 400 Units by mouth daily.   Yes [provider]  Ferrous Sulfate (IRON) 325 (65 FE) MG TABS Take 325 mg by mouth daily.   Yes [provider]  folic acid (FOLVITE) 1 MG tablet Take 1 mg by mouth daily. 11/05/22  Yes [provider]  gabapentin (NEURONTIN) 400 MG capsule Take 1 capsule (400 mg total) by mouth 3 (three) times daily. 03/04/18  Yes Erie, KModena Nunnery MD  LANTUS SOLOSTAR 100 UNIT/ML Solostar Pen Inject 10 Units into the skin at bedtime. 10/29/22  Yes [provider]  levETIRAcetam (KEPPRA) 1000 MG tablet Take 1 tablet (1,000 mg total) by mouth 2 (two) times daily. 10/01/22  Yes GMercy Riding MD  levothyroxine (SYNTHROID) 100 MCG tablet Take 100 mcg by mouth daily before breakfast.   Yes [provider]  loperamide (IMODIUM A-D) 2 MG  tablet Take 2 mg by mouth daily as needed for diarrhea or loose stools.   Yes [provider]  magnesium oxide (MAG-OX) 400 MG tablet Take 1 tablet (400 mg total) by mouth 2 (two) times daily. 11/19/17  Yes LImogene Burn PA-C  metFORMIN (GLUCOPHAGE) 1000 MG tablet TAKE ONE TABLET BY MOUTH TWICE DAILY WITH A MEAL. Patient taking differently: Take 1,000 mg by mouth 2 (two) times daily with a meal. 09/24/19  Yes GSande RivesE, PA-C  metolazone (ZAROXOLYN) 2.5 MG tablet Two Times Weekly on Monday and Thursday Patient taking differently: Take 2.5 mg by mouth 2 (two) times a week. Monday and Thursday 02/08/20  Yes Branch,Alphonse Guild MD  Multiple Vitamin (MULTIVITAMIN WITH MINERALS) TABS tablet Take 1 tablet by mouth daily. 10/02/22  Yes GMercy Riding MD  naproxen (NAPROSYN) 500 MG tablet Take 500 mg by mouth 2 (two) times daily. 11/05/22  Yes [provider]  pantoprazole (PROTONIX) 40 MG tablet Take 1 tablet (40 mg total) by mouth daily. 02/08/20  Yes Branch, JAlphonse Guild MD  polyethylene glycol powder (MIRALAX) 17 GM/SCOOP powder Take 17 g by mouth 2 (two) times daily as needed for moderate constipation or mild constipation. 10/01/22  Yes GMercy Riding MD  potassium chloride (KLOR-CON) 10 MEQ tablet Take 10 mEq by mouth 2 (two) times daily.   Yes [provider]  pravastatin (PRAVACHOL) 40  MG tablet TAKE ONE TABLET BY MOUTH DAILY. Patient taking differently: Take 40 mg by mouth daily. 05/21/16  Yes Export, Modena Nunnery, MD  sacubitril-valsartan (ENTRESTO) 49-51 MG Take 1 tablet by mouth 2 (two) times daily. 02/08/20  Yes Branch, Alphonse Guild, MD  senna-docusate (SENOKOT-S) 8.6-50 MG tablet Take 1 tablet by mouth 2 (two) times daily between meals as needed for mild constipation. 10/01/22  Yes Mercy Riding, MD  tamsulosin (FLOMAX) 0.4 MG CAPS capsule TAKE (1) CAPSULE BY MOUTH EVERY DAY. Patient taking differently: Take 0.4 mg by mouth daily. 10/10/21  Yes Lindell Spar, MD  traZODone  (DESYREL) 50 MG tablet Take 1 tablet (50 mg total) by mouth at bedtime. TAKE 1 TABLET BY MOUTH AT BEDTIME AS NEEDED FOR SLEEP. 10/01/22  Yes Mercy Riding, MD  carvedilol (COREG) 25 MG tablet Take 1 tablet (25 mg total) by mouth 2 (two) times daily with a meal. Patient not taking: Reported on 11/18/2022 10/01/22 12/30/22  Mercy Riding, MD  insulin glargine-yfgn (SEMGLEE) 100 UNIT/ML Pen  06/18/22   [provider]  metFORMIN (GLUCOPHAGE) 500 MG tablet Take 500 mg by mouth 3 (three) times daily. Patient not taking: Reported on 11/18/2022 10/10/22   [provider]  [facility] powder Apply topically. Patient not taking: Reported on 11/18/2022 10/29/22   [provider]  sodium chloride 1 g tablet Take 1 g by mouth every morning. Patient not taking: Reported on 11/18/2022 10/16/22   [provider]  terbinafine (LAMISIL) 250 MG tablet TAKE ONE TABLET BY MOUTH ONCE DAILY. Patient not taking: Reported on 11/18/2022 08/20/22   Lindell Spar, MD  triamcinolone cream (KENALOG) 0.1 % Apply 1 Application topically 2 (two) times daily. Patient not taking: Reported on 11/18/2022    [provider]  vitamin B-12 (VITAMIN B12) 500 MCG tablet Take 3 tablets (1,500 mcg total) by mouth daily. Patient not taking: Reported on 11/18/2022 10/02/22   Mercy Riding, MD   No Known Allergies Review of Systems  Unable to perform ROS: Acuity of condition    Physical Exam Vitals and nursing note reviewed.  Cardiovascular:     Rate and Rhythm: Normal rate.  Pulmonary:     Effort: Pulmonary effort is normal. No respiratory distress.  Skin:    General: Skin is warm and dry.     Comments: Bilateral lower extremities scaly and dark  Neurological:     Mental Status: He is alert.     Comments: Oriented to self only at this time  Psychiatric:     Comments: Calm and cooperative, not fearful     Vital Signs: BP (!) 115/49   Pulse 80   Temp 97.7 F (36.5 C) (Oral)   Resp 20   Ht 5' 7"$   (1.702 m)   Wt 76.8 kg   SpO2 99%   BMI 26.52 kg/m  Pain Scale: 0-10   Pain Score: Asleep   SpO2: SpO2: 99 % O2 Device:SpO2: 99 % O2 Flow Rate: .   IO: Intake/output summary:  Intake/Output Summary (Last 24 hours) at 11/19/2022 1425 Last data filed at 11/19/2022 [id number] Gross per 24 hour  Intake 5497.75 ml  Output 950 ml  Net 4547.75 ml    LBM:   Baseline Weight: Weight: 76.7 kg Most recent weight: Weight: 76.8 kg     Palliative Assessment/Data:     Time In: 1215 Time Out: 1330 Time Total: 75 minutes  Greater than 50%  of this time was spent counseling  and coordinating care related to the above assessment and plan.  Signed by: Drue Novel, NP   Please contact Palliative Medicine Team phone at [phone] for questions and concerns.  For individual provider: See Shea Evans

## 2022-11-19 NOTE — TOC Initial Note (Signed)
Transition of Care [facility]) - Initial/Assessment Note    Patient Details  Name: Michael [name] MRN: [id number] Date of Birth: 1946-07-06  Transition of Care [facility]) CM/SW Contact:    Salome Arnt, [place] Phone Number: 11/19/2022, 11:02 AM  Clinical Narrative:  Pt admitted due to severe sepsis. Assessment completed with pt's niece, [name] who reports she is HCPOA. Pt has been a resident at [facility] for about a month after completing rehab at [facility]. He requires some assist with ADLs. Pt uses wheelchair at ALF. He is active with HHPT/OT through Billey Chang with [facility] aware of admission. Per Juliann Pulse at ALF, okay to return as long as ALF appropriate. [name] requests same. TOC will continue to follow.                  Expected Discharge Plan: Assisted Living Barriers to Discharge: Continued Medical Work up   Patient Goals and CMS Choice Patient states their goals for this hospitalization and ongoing recovery are:: hopeful for return to ALF   Choice offered to / list presented to : [facility] POA / Guardian (niece) [place] ownership interest in [facility].provided to::  (n/a)    Expected Discharge Plan and Services In-house Referral: Clinical Social Work   Post Acute Care Choice: Resumption of Svcs/PTA Provider Living arrangements for the past 2 months: Assisted Living Facility                           HH Arranged: PT, OT [place] Agency: Other - See comment Therapist, music) Date HH Agency Contacted: 11/19/22 Time HH Agency Contacted: 39 Representative spoke with at [facility] Arrangements/Services Living arrangements for the past 2 months: [place] Lives with:: Facility Resident Patient language and need for interpreter reviewed:: Yes Do you feel safe going back to the place where you live?: Yes      Need for Family Participation in Patient Care: Yes (Comment) Care giver support system in place?: Yes (comment) Current home  services: Home OT, Home PT, DME (wheelchair) Criminal Activity/Legal Involvement Pertinent to Current Situation/Hospitalization: No - Comment as needed  Activities of Daily Living      Permission Sought/Granted                  Emotional Assessment   Attitude/Demeanor/Rapport: Unable to Assess Affect (typically observed): Unable to Assess Orientation: : Oriented to Self Alcohol / Substance Use: Not Applicable Psych Involvement: No (comment)  Admission diagnosis:  Heme positive stool [R19.5] Hypotension [I95.9] Symptomatic anemia [D64.9] Hypotension due to hypovolemia [I95.89, E86.1] Patient Active Problem List   Diagnosis Date Noted   UTI (urinary tract infection) 11/18/2022   AKI (acute kidney injury) ([place]) 11/18/2022   Severe sepsis ([place]) 0000000   Acute metabolic encephalopathy 0000000   Tonic-clonic seizure ([place]) 10/01/2022   Pressure injury of skin 10/01/2022   Paroxysmal atrial fibrillation with RVR ([place]) 10/01/2022   Venous stasis dermatitis 10/01/2022   Disorientation 09/19/2022   Hypertensive crisis 09/18/2022   Encounter for general adult medical examination with abnormal findings 06/20/2022   Onychomycosis 06/20/2022   Physical deconditioning 12/21/2021   Acute cystitis without hematuria 07/10/2021   Benign prostatic hyperplasia with urinary frequency 07/10/2021   Vitamin D deficiency 05/23/2021   Anxiety 01/11/2021   Chronic obstructive lung disease ([place]) 01/11/2021   Contact dermatitis and other eczema 01/11/2021   Insulin dependent type 2 diabetes mellitus ([place]) 01/11/2021   Diabetic  peripheral neuropathy ([place]) 01/11/2021   Gout 01/11/2021   History of colonic polyps 01/11/2021   Ventral hernia 01/11/2021   Neck pain 11/20/2020   Lymphedema of lower extremity 11/18/2018   Chronic pain syndrome 05/06/2018   Depression, major, single episode, mild ([place]) 06/20/2017   DNR (do not resuscitate) 06/20/2017   Chronic combined systolic and  diastolic heart failure ([place]) 10/23/2016   Hypothyroidism 11/30/2014   Sinus pause 07/21/2014   Difficulty walking 05/03/2014   Acute anemia 07/14/2013   Spinal stenosis of lumbar region 07/06/2013   Carpal tunnel syndrome 04/13/2013   DDD (degenerative disc disease), lumbosacral 04/13/2013   Peripheral neuropathy 01/05/2013   S/P arthroscopy of left knee 11/09/2012   Osteoarthritis of knee 10/07/2012   NASH (nonalcoholic steatohepatitis) 05/22/2007   Hyperlipidemia 10/15/2006   Class 3 obesity ([place]) 10/15/2006   Hypertension 10/15/2006   Gastroesophageal reflux disease 10/15/2006   PCP:  Clinic, [place], [place] [address] Phone: [phone] Fax: [place], [place] - [address] [place] [address] Phone: [phone] Fax: [phone]     Social Determinants of Health (SDOH) Social History: [place]: No Food Insecurity (09/19/2022)  Housing: Low Risk  (09/19/2022)  Transportation Needs: No Transportation Needs (09/19/2022)  Utilities: Not At Risk (09/19/2022)  Alcohol Screen: Low Risk  (04/18/2022)  Depression (PHQ2-9): Low Risk  (06/20/2022)  Financial Resource Strain: Low Risk  (04/18/2022)  Physical Activity: Inactive (04/18/2022)  Social Connections: Moderately Isolated (04/18/2022)  Stress: No Stress Concern Present (04/18/2022)  Tobacco Use: Medium Risk (10/16/2022)   SDOH Interventions:     Readmission Risk Interventions    11/19/2022   11:00 AM  Readmission Risk Prevention Plan  Transportation Screening Complete  Medication Review (RN Care Manager) Complete  HRI or Home Care Consult Complete  SW Recovery Care/Counseling Consult Complete  Palliative Care Screening Not [place] Not Applicable

## 2022-11-19 NOTE — Progress Notes (Signed)
[facility] Progress Note Patient Name: Michael [name] DOB: Jun 14, 1946 MRN: [id number]   Date of Service  11/19/2022  HPI/Events of Note  Was called by hospitalist for recommendations. 77 year old man from SNF now with septic shock and confusion, secondary to UTI. No focal signs and was initially alert and then got drowsy. No meningitis signs documented. UA with UTI. CXR with no infiltrates. Given more than 3 L  fluids and has ended up on levophed and now being started on Phenylephrine as well. Central line being planned. Seen on camera. HR in 90s. MAP is 61. No overt distress. Elderly male laying in bed. Was also given 1 unit RBC for occult blood positive stool but no overt bleeding endorsed. Has A fib on Eliquis.   eICU Interventions  Recommendations : Agree with CVC placement Pressors to keep Map > 65 Vanc, ceftriaxone and flagyl given . Follow cultures Check labs now - cbc bmp and lactic acid Start stress steroids - 100 mg hydrocortisone now and then 50 mg every 6 hours  Hold eliquis with concern for possible GI bleed Serial H/H  DNR status noted Call E link for assistance      Intervention Category Major Interventions: Shock - evaluation and management Evaluation Type: New Patient Evaluation  Margaretmary Lombard 11/19/2022, 12:15 AM

## 2022-11-19 NOTE — TOC Progression Note (Signed)
Transition of Care [facility]) - Progression Note    Patient Details  Name: VALERIY TALBURT MRN: [id number] Date of Birth: 1946-05-08  Transition of Care [facility]) CM/SW Contact  Salome Arnt, [place] Phone Number: 11/19/2022, 8:07 AM  Clinical Narrative:  [place] notification completed. Notification ID: [id number].          Expected Discharge Plan and Services                                               Social Determinants of Health (SDOH) Interventions SDOH Screenings   Food Insecurity: No Food Insecurity (09/19/2022)  Housing: Low Risk  (09/19/2022)  Transportation Needs: No Transportation Needs (09/19/2022)  Utilities: Not At Risk (09/19/2022)  Alcohol Screen: Low Risk  (04/18/2022)  Depression (PHQ2-9): Low Risk  (06/20/2022)  Financial Resource Strain: Low Risk  (04/18/2022)  Physical Activity: Inactive (04/18/2022)  Social Connections: Moderately Isolated (04/18/2022)  Stress: No Stress Concern Present (04/18/2022)  Tobacco Use: Medium Risk (10/16/2022)    Readmission Risk Interventions     No data to display

## 2022-11-19 NOTE — Progress Notes (Signed)
eLink Physician-Brief Progress Note Patient Name: Michael [name] DOB: 06/27/1946 MRN: [id number]   Date of Service  11/19/2022  HPI/Events of Note  Notified of lab results: K+ 5.3 Albumin 1.7 Hgb improved 10.6 Lactic normal 0.7  eICU Interventions  No change in intervention necessary at this time     Intervention Category Intermediate Interventions: Electrolyte abnormality - evaluation and management  [name] 11/19/2022, 12:56 AM

## 2022-11-19 NOTE — Progress Notes (Signed)
Came down to put in ART line on patient, Dr. Waverly Ferrari was already in room and had placed central line in patient and performed ART line as well.  Assisted with dressing of line after placement.

## 2022-11-20 ENCOUNTER — Inpatient Hospital Stay: Payer: Medicare Other | Admitting: Diagnostic Neuroimaging

## 2022-11-20 DIAGNOSIS — Z515 Encounter for palliative care: Secondary | ICD-10-CM | POA: Diagnosis not present

## 2022-11-20 DIAGNOSIS — Z7189 Other specified counseling: Secondary | ICD-10-CM | POA: Diagnosis not present

## 2022-11-20 DIAGNOSIS — A419 Sepsis, unspecified organism: Secondary | ICD-10-CM | POA: Diagnosis not present

## 2022-11-20 DIAGNOSIS — R6520 Severe sepsis without septic shock: Secondary | ICD-10-CM | POA: Diagnosis not present

## 2022-11-20 LAB — URINE CULTURE: Culture: >=100000 — AB

## 2022-11-20 LAB — CBC
HCT: 29.8 % — ABNORMAL LOW (ref 39.0–52.0)
Hemoglobin: 9.8 g/dL — ABNORMAL LOW (ref 13.0–17.0)
MCH: 28.2 pg (ref 26.0–34.0)
MCHC: 32.9 g/dL (ref 30.0–36.0)
MCV: 85.9 fL (ref 80.0–100.0)
Platelets: 254 10*3/uL (ref 150–400)
RBC: 3.47 MIL/uL — ABNORMAL LOW (ref 4.22–5.81)
RDW: 17.4 % — ABNORMAL HIGH (ref 11.5–15.5)
WBC: 4.4 10*3/uL (ref 4.0–10.5)
nRBC: 0 % (ref 0.0–0.2)

## 2022-11-20 LAB — BASIC METABOLIC PANEL
Anion gap: 9 (ref 5–15)
BUN: 50 mg/dL — ABNORMAL HIGH (ref 8–23)
CO2: 16 mmol/L — ABNORMAL LOW (ref 22–32)
Calcium: 7.7 mg/dL — ABNORMAL LOW (ref 8.9–10.3)
Chloride: 114 mmol/L — ABNORMAL HIGH (ref 98–111)
Creatinine, Ser: 1.58 mg/dL — ABNORMAL HIGH (ref 0.61–1.24)
GFR, Estimated: 45 mL/min — ABNORMAL LOW (ref >60)
Glucose, Bld: 175 mg/dL — ABNORMAL HIGH (ref 70–99)
Potassium: 3.6 mmol/L (ref 3.5–5.1)
Sodium: 139 mmol/L (ref 135–145)

## 2022-11-20 LAB — GLUCOSE, CAPILLARY
Glucose-Capillary: 157 mg/dL — ABNORMAL HIGH (ref 70–99)
Glucose-Capillary: 182 mg/dL — ABNORMAL HIGH (ref 70–99)
Glucose-Capillary: 188 mg/dL — ABNORMAL HIGH (ref 70–99)
Glucose-Capillary: 163 mg/dL — ABNORMAL HIGH (ref 70–99)
Glucose-Capillary: 179 mg/dL — ABNORMAL HIGH (ref 70–99)

## 2022-11-20 LAB — MAGNESIUM: Magnesium: 1.7 mg/dL (ref 1.7–2.4)

## 2022-11-20 MED ORDER — INSULIN ASPART 100 UNIT/ML IJ SOLN: 0.0000 [IU] | Freq: Every day | INTRAMUSCULAR | Status: DC

## 2022-11-20 MED ORDER — LEVETIRACETAM 500 MG PO TABS
1000.0000 mg | ORAL_TABLET | Freq: Two times a day (BID) | ORAL | Status: DC
  Administered 2022-11-20 – 2022-11-21 (×3): 1000 mg via ORAL
  Filled 2022-11-20 (×3): qty 2

## 2022-11-20 MED ORDER — CARVEDILOL 12.5 MG PO TABS: 12.5000 mg | ORAL_TABLET | Freq: Two times a day (BID) | ORAL | Status: DC

## 2022-11-20 MED ORDER — CARVEDILOL 12.5 MG PO TABS
25.0000 mg | ORAL_TABLET | Freq: Two times a day (BID) | ORAL | Status: DC
  Administered 2022-11-20 – 2022-11-21 (×3): 25 mg via ORAL
  Filled 2022-11-20 (×3): qty 2

## 2022-11-20 MED ORDER — INSULIN ASPART 100 UNIT/ML IJ SOLN
0.0000 [IU] | Freq: Three times a day (TID) | INTRAMUSCULAR | Status: DC
  Administered 2022-11-20 – 2022-11-21 (×4): 2 [IU] via SUBCUTANEOUS

## 2022-11-20 MED ORDER — FOLIC ACID 1 MG PO TABS
1.0000 mg | ORAL_TABLET | Freq: Every day | ORAL | Status: DC
  Administered 2022-11-20 – 2022-11-21 (×2): 1 mg via ORAL
  Filled 2022-11-20 (×2): qty 1

## 2022-11-20 MED ORDER — FERROUS SULFATE 325 (65 FE) MG PO TABS
325.0000 mg | ORAL_TABLET | Freq: Every day | ORAL | Status: DC
  Administered 2022-11-20 – 2022-11-21 (×2): 325 mg via ORAL
  Filled 2022-11-20 (×2): qty 1

## 2022-11-20 NOTE — Progress Notes (Signed)
Palliative: [name], Michael [name], is resting quietly in bed.  He greets me, making and mostly keeping eye contact.  He is calm and cooperative, pleasant.  He is oriented to self and situation only.  I believe that he can make his needs known.  His niece/HCPOA, Michael [name], is present at bedside.  Face-to-face discussion with bedside nursing staff related to patient condition, needs.  We talk about Michael [name]'s acute and chronic health concerns.  We talk about his improvement from sepsis, anticipated transfer to the floor today.  We talk about disposition.  Michael [name] and Michael [name] are agreeable to PT evaluation and the possibility of short-term rehab if needed.  Ultimately the goal is to return to [facility].  They would like [facility] to provide hospice care.  They are considering do not rehospitalize.  Conference with attending, bedside nursing staff, transition of care team related to patient condition, needs, goals of care, disposition.  Plan:   Continue to treat the treatable but no CPR or intubation.  Considering short-term rehab versus returning to [facility].  Requesting [facility] hospice care.  69 minutes Michael Axe, NP Palliative medicine team Team phone [phone] Greater than 50% of this time was spent counseling and coordinating care related to the above assessment and plan.

## 2022-11-20 NOTE — TOC Progression Note (Signed)
Transition of Care [facility]) - Progression Note    Patient Details  Name: Michael [name] MRN: [id number] Date of Birth: 1946/09/25  Transition of Care [facility]) CM/SW Contact  Salome Arnt, [place] Phone Number: 11/20/2022, 3:50 PM  Clinical Narrative:   Per palliative, family request [name] to follow after return to [facility]. Referral made to [facility].     Expected Discharge Plan: Assisted Living Barriers to Discharge: Continued Medical Work up  Expected Discharge Plan and Services In-house Referral: Clinical Social Work   Post Acute Care Choice: Resumption of Svcs/PTA Provider Living arrangements for the past 2 months: Assisted Living Facility                           HH Arranged: PT, OT [facility] Agency: Other - See comment Therapist, music) Date HH Agency Contacted: 11/19/22 Time [place]: 1101 Representative spoke with at [place]: [place] Determinants of Health ([place]) Interventions [place]: No Food Insecurity (09/19/2022)  Housing: Low Risk  (09/19/2022)  Transportation Needs: No Transportation Needs (09/19/2022)  Utilities: Not At Risk (09/19/2022)  Alcohol Screen: Low Risk  (04/18/2022)  Depression (PHQ2-9): Low Risk  (06/20/2022)  Financial Resource Strain: Low Risk  (04/18/2022)  Physical Activity: Inactive (04/18/2022)  Social Connections: Moderately Isolated (04/18/2022)  Stress: No Stress Concern Present (04/18/2022)  Tobacco Use: Medium Risk (11/19/2022)    Readmission Risk Interventions    11/19/2022   11:00 AM  Readmission Risk Prevention Plan  Transportation Screening Complete  Medication Review (RN Care Manager) Complete  HRI or Home Care Consult Complete  SW Recovery Care/Counseling Consult Complete  Palliative Care Screening Not [place] Not Applicable

## 2022-11-20 NOTE — Plan of Care (Signed)
  Problem: Acute Rehab PT Goals(only PT should resolve) Goal: Pt Will Go Supine/Side To Sit Outcome: Progressing Flowsheets (Taken 11/20/2022 1552) Pt will go Supine/Side to Sit:  Independently  with modified independence Goal: Patient Will Transfer Sit To/From Stand Outcome: Progressing Flowsheets (Taken 11/20/2022 1552) Patient will transfer sit to/from stand:  with supervision  with modified independence Goal: Pt Will Transfer Bed To Chair/Chair To Bed Outcome: Progressing Flowsheets (Taken 11/20/2022 1552) Pt will Transfer Bed to Chair/Chair to Bed:  with modified independence  with supervision Goal: Pt Will Ambulate Outcome: Progressing Flowsheets (Taken 11/20/2022 1552) Pt will Ambulate:  15 feet  with minimal assist  with moderate assist  with rolling walker   3:52 PM, 11/20/22 Lonell Grandchild, MPT Physical Therapist with [facility] [phone] office [phone] mobile phone

## 2022-11-20 NOTE — Progress Notes (Signed)
PROGRESS NOTE    Michael [name]  [id number] DOB: 06-Oct-1945 DOA: 11/18/2022 PCP: [facility]   Brief Narrative:    Michael [name] is a 77 y.o. male with medical history significant for atrial fibrillation on chronic anticoagulation, hypertension, gout, diabetes mellitus, COPD, CHF, [place]. Patient was brought to the ED from nursing home via EMS reports of altered mental status.  He was admitted with acute metabolic encephalopathy as well as AKI in the setting of septic shock secondary to UTI.  He was empirically started on Rocephin, vancomycin, and metronidazole with cultures pending.  He is now noted to have Klebsiella UTI and will be kept on Rocephin only per sensitivities.  Assessment & Plan:   Principal Problem:   Severe sepsis ([place]) Active Problems:   UTI (urinary tract infection)   AKI (acute kidney injury) ([place])   Acute metabolic encephalopathy   NASH (nonalcoholic steatohepatitis)   Acute anemia   Hypothyroidism   Chronic combined systolic and diastolic heart failure (HCC)   Chronic obstructive lung disease (HCC)   Diabetic peripheral neuropathy (HCC)   Gout   Tonic-clonic seizure (HCC)   Paroxysmal atrial fibrillation with RVR (HCC)  Assessment and Plan:   Acute metabolic encephalopathy secondary to septic shock-resolved Continue IV antibiotics with Rocephin only For IV fluid Mentation appears to be improving Blood cultures with no growth to date Discontinue further hydrocortisone Okay for transfer to telemetry  AKI (acute kidney injury) (HCC)-secondary to above-improving Creatinine elevated at 3.07.  Normal baseline renal function creatinine 0.6 - 1.  Patient appears dehydrated, reported poor oral intake a week ago, home medications include Entresto, Bumex, metolazone. -Hold further IV fluid -Hold Entresto, Bumex, metolazone -Appears to be improving, continue to monitor, continue to monitor   Klebsiella UTI (urinary tract infection) -IV  Rocephin d3/5   Paroxysmal atrial fibrillation with RVR (HCC)-resolved Heart rate initially 80s spikes to 150.  On anticoagulation with Eliquis.  He is now reporting some chest pain. -Currently in sinus rhythm -Plan to resume Coreg -Hold Eliquis and have GI evaluation as noted below   Tonic-clonic seizure ([place]) Resume Keppra   Gout Resume allopurinol   Diabetic peripheral neuropathy ([place]) - SSI- S -Hold home long-acting insulin, metformin - HgbA1c   Chronic combined systolic and diastolic heart failure ([place]) Stable and compensated, appears hypovolemic.  08/2022, EF of 50 to 55%. -Hold Entresto, metolazone, Bumex -Resume carvedilol   Hypothyroidism Resume Synthroid. TSH 3.8   Acute anemia-stable Hemoglobin 9.5, stool occult positive- brown stool without blood, baseline hemoglobin over the past 2 months 12-14.  He is on anticoagulation with Eliquis.  He denies melena, hematemesis or hematochezia.  -Plan to hold Eliquis and aspirin for now and involve GI for further evaluation   DVT prophylaxis:SCDs Code Status: DNR Family Communication: Niece, POA, bedside 2/20 Disposition Plan:  Status is: Inpatient Remains inpatient appropriate because: Need for ongoing IV fluid and medications.   Consultants:  Palliative care GI  Procedures:  Right IJ central venous line placement 2/20  Antimicrobials:  Anti-infectives (From admission, onward)    Start     Dose/Rate Route Frequency Ordered Stop   11/21/22 0000  vancomycin (VANCOREADY) IVPB 1500 mg/300 mL        1,500 mg 150 mL/hr over 120 Minutes Intravenous Every 48 hours 11/19/22 1059     11/19/22 1800  cefTRIAXone (ROCEPHIN) 2 g in sodium chloride 0.9 % 100 mL IVPB        2 g 200 mL/hr over 30 Minutes  Intravenous Every 24 hours 11/18/22 2059     11/18/22 2230  vancomycin (VANCOREADY) IVPB 1500 mg/300 mL        1,500 mg 150 mL/hr over 120 Minutes Intravenous  Once 11/18/22 2134 11/19/22 0102   11/18/22 2200   metroNIDAZOLE (FLAGYL) IVPB 500 mg        500 mg 100 mL/hr over 60 Minutes Intravenous Every 12 hours 11/18/22 2059     11/18/22 2134  vancomycin variable dose per unstable renal function (pharmacist dosing)         Does not apply See admin instructions 11/18/22 2134     11/18/22 2130  cefTRIAXone (ROCEPHIN) 1 g in sodium chloride 0.9 % 100 mL IVPB        1 g 200 mL/hr over 30 Minutes Intravenous  Once 11/18/22 2059 11/18/22 2307   11/18/22 1800  cefTRIAXone (ROCEPHIN) 1 g in sodium chloride 0.9 % 100 mL IVPB  Status:  Discontinued        1 g 200 mL/hr over 30 Minutes Intravenous Every 24 hours 11/18/22 1706 11/18/22 2059      Subjective: Patient seen and evaluated today with no new acute complaints or concerns. No acute concerns or events noted overnight.  Blood pressure appears to be improving this morning and he is alert and awake.  He is eager for discharge when able.  Objective: Vitals:   11/20/22 0400 11/20/22 0500 11/20/22 0740 11/20/22 0909  BP: (!) 123/53   (!) 149/57  Pulse: 75   85  Resp: 16     Temp: 97.6 F (36.4 C)  (!) 97.5 F (36.4 C)   TempSrc: Oral  Oral   SpO2: 97%     Weight:  79.3 kg    Height:        Intake/Output Summary (Last 24 hours) at 11/20/2022 1106 Last data filed at 11/19/2022 2200 Gross per 24 hour  Intake 60 ml  Output 1675 ml  Net -1615 ml   Filed Weights   11/18/22 1857 11/19/22 0500 11/20/22 0500  Weight: 76.1 kg 76.8 kg 79.3 kg    Examination:  General exam: Appears calm and comfortable  Respiratory system: Clear to auscultation. Respiratory effort normal. Cardiovascular system: S1 & S2 heard, RRR.  Gastrointestinal system: Abdomen is soft Central nervous system: Alert and awake Extremities: No edema Skin: No significant lesions noted Psychiatry: Flat affect.    Data Reviewed: I have personally reviewed following labs and imaging studies  CBC: Recent Labs  Lab 11/18/22 1118 11/19/22 0024 11/19/22 0421 11/20/22 0439   WBC 7.9 8.8 10.7* 4.4  NEUTROABS 6.7  --   --   --   HGB 9.5* 10.6* 10.9* 9.8*  HCT 30.1* 31.7* 33.2* 29.8*  MCV 87.8 86.6 86.7 85.9  PLT 290 261 270 0000000   Basic Metabolic Panel: Recent Labs  Lab 11/18/22 1118 11/19/22 0024 11/19/22 0421 11/20/22 0439  NA 133* 135 138 139  K 5.8* 5.3* 4.8 3.6  CL 106 110 114* 114*  CO2 20* 16* 13* 16*  GLUCOSE 105* 108* 136* 175*  BUN 91* 77* 66* 50*  CREATININE 3.07* 2.36* 2.05* 1.58*  CALCIUM 8.5* 7.7* 7.5* 7.7*  MG 2.5*  --   --  1.7   GFR: Estimated Creatinine Clearance: 37.8 mL/min (A) (by C-G formula based on SCr of 1.58 mg/dL (H)). Liver Function Tests: Recent Labs  Lab 11/18/22 1118 11/19/22 0024  AST 22 23  ALT 21 19  ALKPHOS 113 92  BILITOT 1.2 1.1  PROT 5.7* 4.9*  ALBUMIN 2.1* 1.7*   No results for input(s): "LIPASE", "AMYLASE" in the last 168 hours. Recent Labs  Lab 11/18/22 1118  AMMONIA 14   Coagulation Profile: Recent Labs  Lab 11/18/22 1118  INR 2.9*   Cardiac Enzymes: No results for input(s): "CKTOTAL", "CKMB", "CKMBINDEX", "TROPONINI" in the last 168 hours. BNP (last 3 results) No results for input(s): "PROBNP" in the last 8760 hours. HbA1C: No results for input(s): "HGBA1C" in the last 72 hours. CBG: Recent Labs  Lab 11/19/22 0608 11/19/22 0754 11/19/22 1642 11/19/22 1943 11/20/22 0104  GLUCAP 152* 147* 161* 188* 157*   Lipid Profile: No results for input(s): "CHOL", "HDL", "LDLCALC", "TRIG", "CHOLHDL", "LDLDIRECT" in the last 72 hours. Thyroid Function Tests: Recent Labs    11/18/22 1715  TSH 3.824   Anemia Panel: No results for input(s): "VITAMINB12", "FOLATE", "FERRITIN", "TIBC", "IRON", "RETICCTPCT" in the last 72 hours. Sepsis Labs: Recent Labs  Lab 11/18/22 1118 11/19/22 0024  LATICACIDVEN 0.9 0.7    Recent Results (from the past 240 hour(s))  Culture, blood (routine x 2)     Status: None (Preliminary result)   Collection Time: 11/18/22 11:18 AM   Specimen: BLOOD   Result Value Ref Range Status   Specimen Description BLOOD RIGHT ASSIST CONTROL  Final   Special Requests   Final    BOTTLES DRAWN AEROBIC AND ANAEROBIC Blood Culture adequate volume   Culture   Final    NO GROWTH 2 DAYS Performed at [facility], [address], [place], [place] [address]    Report Status PENDING  Incomplete  Culture, blood (routine x 2)     Status: None (Preliminary result)   Collection Time: 11/18/22 11:25 AM   Specimen: BLOOD  Result Value Ref Range Status   Specimen Description BLOOD LEFT ASSIST CONTROL  Final   Special Requests   Final    BOTTLES DRAWN AEROBIC AND ANAEROBIC Blood Culture adequate volume   Culture   Final    NO GROWTH 2 DAYS Performed at [facility], [address], [place], [place] [address]    Report Status PENDING  Incomplete  Urine Culture     Status: Abnormal   Collection Time: 11/18/22  1:37 PM   Specimen: Urine, Random  Result Value Ref Range Status   Specimen Description   Final    URINE, RANDOM Performed at [facility], [address] [place], [place] [address]    Special Requests   Final    NONE Performed at [facility], [address], [place], [place] [address]    Culture >=100,000 COLONIES/mL KLEBSIELLA PNEUMONIAE (A)  Final   Report Status 11/20/2022 FINAL  Final   Organism ID, Bacteria KLEBSIELLA PNEUMONIAE (A)  Final      Susceptibility   Klebsiella pneumoniae - MIC*    AMPICILLIN >=32 RESISTANT Resistant     CEFAZOLIN <=4 SENSITIVE Sensitive     CEFEPIME <=0.12 SENSITIVE Sensitive     CEFTRIAXONE <=0.25 SENSITIVE Sensitive     CIPROFLOXACIN <=0.25 SENSITIVE Sensitive     GENTAMICIN <=1 SENSITIVE Sensitive     IMIPENEM <=0.25 SENSITIVE Sensitive     NITROFURANTOIN 64 INTERMEDIATE Intermediate     TRIMETH/SULFA <=20 SENSITIVE Sensitive     AMPICILLIN/SULBACTAM 4 SENSITIVE Sensitive     PIP/TAZO <=4 SENSITIVE Sensitive     * >=100,000 COLONIES/mL KLEBSIELLA PNEUMONIAE  MRSA Next Gen by PCR, Nasal     Status:  Abnormal   Collection Time: 11/18/22  7:15 PM  Specimen: Nasal Mucosa; Nasal Swab  Result Value Ref Range Status   MRSA by PCR Next Gen DETECTED (A) NOT DETECTED Final    Comment: RESULT CALLED TO, READ BACK BY AND VERIFIED WITH: FOLEY, B AT 1414 ON 11/19/22 NIELSEN, S (NOTE) The GeneXpert MRSA Assay (FDA approved for NASAL specimens only), is one component of a comprehensive MRSA colonization surveillance program. It is not intended to diagnose MRSA infection nor to guide or monitor treatment for MRSA infections. Test performance is not FDA approved in patients less than 44 years old. Performed at [facility], [address] [place], [place] [address]          Radiology Studies: [facility] Chest [facility] 1 View  Result Date: 11/19/2022 CLINICAL DATA:  Check central line placement EXAM: PORTABLE CHEST 1 VIEW COMPARISON:  11/18/2022 FINDINGS: Cardiac shadow is stable. Right jugular central line is noted catheter tip at cavoatrial junction. No evidence of pneumothorax is seen. Mild central vascular congestion is noted. No bony abnormality is seen. IMPRESSION: Central line in satisfactory position. No definitive pneumothorax is noted. Mild central vascular congestion. Electronically Signed   By: Inez Catalina M.D.   On: 11/19/2022 01:56   DG Chest Port 1 View  Result Date: 11/18/2022 CLINICAL DATA:  Altered mental status EXAM: PORTABLE CHEST 1 VIEW COMPARISON:  09/21/2022 x-ray and older.  CT angiogram 09/23/2022 FINDINGS: The heart size and mediastinal contours are within normal limits. Both lungs are clear. The visualized skeletal structures are unremarkable. Calcified aorta. Overlapping gown snaps. Lordotic x-ray. IMPRESSION: No active disease. Electronically Signed   By: Jill Side M.D.   On: 11/18/2022 12:23        Scheduled Meds:  allopurinol  100 mg Oral Daily   Chlorhexidine Gluconate Cloth  6 each Topical Daily   Chlorhexidine Gluconate Cloth  6 each Topical Q0600   hydrocortisone  sod succinate (SOLU-CORTEF) inj  100 mg Intravenous Q12H   insulin aspart  0-9 Units Subcutaneous Q6H   levothyroxine  100 mcg Oral QAC breakfast   metoprolol tartrate  12.5 mg Oral BID   mupirocin ointment  1 Application Nasal BID   pantoprazole (PROTONIX) IV  40 mg Intravenous Q24H   tamsulosin  0.4 mg Oral Daily   vancomycin variable dose per unstable renal function (pharmacist dosing)   Does not apply See admin instructions   Continuous Infusions:  sodium chloride 20 mL/hr at 11/19/22 0613   sodium chloride Stopped (11/19/22 0003)   cefTRIAXone (ROCEPHIN)  IV 2 g (11/19/22 1700)   levETIRAcetam 1,000 mg (11/19/22 2334)   metronidazole 500 mg (11/20/22 0924)   norepinephrine (LEVOPHED) Adult infusion Stopped (11/19/22 2048)   phenylephrine (NEO-SYNEPHRINE) Adult infusion Stopped (11/19/22 0528)   [START ON 11/21/2022] vancomycin       LOS: 2 days    Total critical care time: 55 minutes.    Charisse Wendell Darleen Crocker, DO [facility]  If 7PM-7AM, please contact night-coverage [email or web address] 11/20/2022, 11:06 AM

## 2022-11-20 NOTE — Plan of Care (Signed)
  Problem: Coping: Goal: Ability to adjust to condition or change in health will improve Outcome: Progressing   

## 2022-11-20 NOTE — Evaluation (Signed)
Physical Therapy Evaluation Patient Details Name: Michael [name] MRN: [id number] DOB: Mar 06, 1946 Today's Date: 11/20/2022  History of Present Illness  CALCIFER SCHWEITZER is a 77 y.o. male with medical history significant for atrial fibrillation on chronic anticoagulation, hypertension, gout, diabetes mellitus, COPD, CHF, [place].  Patient was brought to the ED from nursing home via EMS reports of altered mental status.  On my evaluation, patient is awake alert oriented and able to answer questions appropriately, but he does not know why he is in the hospital.  He endorses pain with urination.  He denies vomiting, no diarrhea, cough no fever no chills, no abdominal pain, no black stools no blood in stools, no bleeding from any orifice.     I called the nursing home, unfortunately history from nursing home is limited.  I talked to one of the staff, reading from the notes left by the prior provider, patient had complained of not feeling well, so recent that patient was disoriented.  Staff not able to explain further reports of altered mental status.  At baseline, patient is awake alert oriented x 4, no significant memory problems, ambulates with a wheelchair.  Reports a week ago patient's had reduced oral intake.   Clinical Impression  Patient demonstrates good return for sitting up at bedside with [facility] slightly raised, slow labored movement for completing stand pivot transfer to chair without loss of balance and able to take a few steps forward/backward at bedside before having to sit due to fatigue.  Patient tolerated sitting up in chair after therapy - nurse notified.  Patient will benefit from continued skilled physical therapy in hospital and recommended venue below to increase strength, balance, endurance for safe ADLs and gait.         Recommendations for follow up therapy are one component of a multi-disciplinary discharge planning process, led by the attending physician.  Recommendations may be updated  based on patient status, additional functional criteria and insurance authorization.  Follow Up Recommendations Home health PT      Assistance Recommended at Discharge Set up Supervision/Assistance  Patient can return home with the following  A little help with walking and/or transfers;A little help with bathing/dressing/bathroom;Help with stairs or ramp for entrance;Assistance with cooking/housework    Equipment Recommendations None recommended by PT  Recommendations for Other Services       Functional Status Assessment Patient has had a recent decline in their functional status and demonstrates the ability to make significant improvements in function in a reasonable and predictable amount of time.     Precautions / Restrictions Precautions Precautions: Fall Restrictions Weight Bearing Restrictions: No      Mobility  Bed Mobility Overal bed mobility: Modified Independent                  Transfers Overall transfer level: Needs assistance Equipment used: Rolling walker (2 wheels), None, 1 person hand held assist Transfers: Sit to/from Stand, Bed to chair/wheelchair/BSC Sit to Stand: Supervision, Min guard Stand pivot transfers: Supervision Step pivot transfers: Supervision, Min guard       General transfer comment: good return for transferring to chair using armrest of chair with slow labored movement    Ambulation/Gait Ambulation/Gait assistance: Min guard, Min assist Gait Distance (Feet): 5 Feet Assistive device: Rolling walker (2 wheels) Gait Pattern/deviations: Decreased step length - right, Decreased step length - left, Decreased stride length, Trunk flexed Gait velocity: slow     General Gait Details: limited to a few steps forward/backward at  bedside before having to sit due to c/o fatigue  Stairs            Wheelchair Mobility    Modified Rankin (Stroke Patients Only)       Balance Overall balance assessment: Needs  assistance Sitting-balance support: Feet supported, No upper extremity supported Sitting balance-Leahy Scale: Fair Sitting balance - Comments: fair/good seated at EOB   Standing balance support: During functional activity, No upper extremity supported Standing balance-Leahy Scale: Poor Standing balance comment: fair using RW                             Pertinent Vitals/Pain Pain Assessment Pain Assessment: No/denies pain    Home Living Family/patient expects to be discharged to:: Assisted living                 Home Equipment: Conservation officer, nature (2 wheels);Wheelchair - manual      Prior Function Prior Level of Function : Needs assist       Physical Assist : Mobility (physical);ADLs (physical) Mobility (physical): Bed mobility;Transfers;Gait;Stairs   Mobility Comments: uses wheelchair for mobility, walk short household distances using RW with assist ADLs Comments: assisted by ALF staff     Hand Dominance   Dominant Hand: Left    Extremity/Trunk Assessment   Upper Extremity Assessment Upper Extremity Assessment: Overall WFL for tasks assessed    Lower Extremity Assessment Lower Extremity Assessment: Generalized weakness    Cervical / Trunk Assessment Cervical / Trunk Assessment: Kyphotic  Communication   Communication: No difficulties  Cognition Arousal/Alertness: Awake/alert Behavior During Therapy: WFL for tasks assessed/performed Overall Cognitive Status: Within Functional Limits for tasks assessed                                          General Comments      Exercises     Assessment/Plan    PT Assessment Patient needs continued PT services  PT Problem List Decreased strength;Decreased activity tolerance;Decreased balance;Decreased mobility       PT Treatment Interventions DME instruction;Gait training;Functional mobility training;Therapeutic activities;Therapeutic exercise;Wheelchair mobility  training;Patient/family education;Balance training    PT Goals (Current goals can be found in the Care Plan section)  Acute Rehab PT Goals Patient Stated Goal: return home with ALF staff to assist PT Goal Formulation: With patient Time For Goal Achievement: 11/22/22 Potential to Achieve Goals: Good    Frequency Min 3X/week     Co-evaluation               AM-PAC PT "6 Clicks" Mobility  Outcome Measure Help needed turning from your back to your side while in a flat bed without using bedrails?: None Help needed moving from lying on your back to sitting on the side of a flat bed without using bedrails?: None Help needed moving to and from a bed to a chair (including a wheelchair)?: A Little Help needed standing up from a chair using your arms (e.g., wheelchair or bedside chair)?: A Little Help needed to walk in hospital room?: A Lot Help needed climbing 3-5 steps with a railing? : A Lot 6 Click Score: 18    End of Session   Activity Tolerance: Patient tolerated treatment well;Patient limited by fatigue Patient left: in chair;with call bell/phone within reach Nurse Communication: Mobility status PT Visit Diagnosis: Unsteadiness on feet (R26.81);Other abnormalities of gait and  mobility (R26.89);Muscle weakness (generalized) (M62.81)    Time: [id number] PT Time Calculation (min) (ACUTE ONLY): 20 min   Charges:   PT Evaluation $PT Eval Moderate Complexity: 1 Mod PT Treatments $Therapeutic Activity: 8-22 mins        3:51 PM, 11/20/22 Lonell Grandchild, MPT Physical Therapist with [facility] 336 [phone] office [phone] mobile phone

## 2022-11-20 NOTE — Consult Note (Signed)
Gastroenterology Consult   Referring Provider: No ref. provider found Primary Care Physician:  [facility] Primary Gastroenterologist:  not established   Patient ID: Michael [name]; [id number]; May 30, 1946   Admit date: 11/18/2022  LOS: 2 days  Date of Consultation: 11/20/2022 Reason for Consultation:  anemia  History of Present Illness   Michael [name] is a 77 y.o. year old male with medical history significant for atrial fibrillation on chronic anticoagulation, hypertension, gout, diabetes mellitus, COPD, CHF, Nash. Patient was brought to the ED from nursing home via EMS reports of AMS He was admitted with acute metabolic encephalopathy as well as AKI in the setting of septic shock secondary to UTI (Klebsiella) requiring pressors for BP support previously. GI consulted for anemia.  Hgb as low as 9.5, 9.8 today though fluctuating into the 10 range over the past few days as well. FOBT positive. No BRBPR or melena reported during admission. Eliquis currently on hold.  Patient alert, oriented to person, place, disoriented to time. He endorses abdominal pain, cannot pintpoint when it began. He endorses mid abdominal pain around his umbilicus. Endorses that certain foods tend to cause his pain, such as fried foods. Has occasional nausea/vomiting, unsure how often. He endorses some heartburn almost every time he eats. Very occasional dysphagia. He is unsure of any presence of rectal bleeding or melena.   Patient defers to his niece, Michael who Is his POA to help provide further history/decision making as he states his memory is not great. I did connect with patient's Niece Michael via telephone to discuss patient's anemia and heme positive stool, she does not believe he has any other recent endoscopic evaluations since 2015. She reports he has been at [facility] facility since the end of January. She does report he has some loose stools and has had some fecal incontinence. No  reports to her regarding any rectal bleeding or melena. She reports that they had a discussion with palliative care and are all in agreement to avoid any invasive procedures, they are planning for patient to go back to [facility] with palliative/hospice care once he is ready for discharge. She does not want patient to undergo any endoscopic procedures at this time given his current health state.   Last EGD: 2015 Inflammatory changes involving the gastric mucosa most likely NSAID effect. I suspect NSAID effect has much to do with his iron deficiency anemia -  status post gastric and duodenal biopsy Biopsy showed mild chronic gastritis   Last Colonoscopy: 2015 Incomplete colonoscopy secondary termination of procedure because of bradycardia  Past Medical History:  Diagnosis Date   Arthritis    Congestive heart disease ([place]) 11/2015   COPD (chronic obstructive pulmonary disease) (HCC)    DDD (degenerative disc disease), lumbosacral    Demand ischemia 09/23/2019   Depression    Diabetic neuropathy (HCC)    Diastolic dysfunction    Essential hypertension    GERD (gastroesophageal reflux disease)    Hyperlipidemia    Iron deficiency anemia    Left knee DJD    Lumbar spinal stenosis    Lymphedema 11/2015   BOTH LEGS   NASH (nonalcoholic steatohepatitis)    Peripheral edema    Peripheral edema 10/05/2015   Type 2 diabetes mellitus ([place])     Past Surgical History:  Procedure Laterality Date   BIOPSY N/A 07/21/2014   Procedure: BIOPSY;  Surgeon: Daneil Dolin, MD;  Location: [facility];  Service: Endoscopy;  Laterality: N/A;  CATARACT EXTRACTION W/PHACO Right 07/08/2016   Procedure: CATARACT EXTRACTION PHACO AND INTRAOCULAR LENS PLACEMENT RIGHT EYE;  Surgeon: Tonny Branch, MD;  Location: [facility];  Service: Ophthalmology;  Laterality: Right;  CDE: 9.01   CATARACT EXTRACTION W/PHACO Left 07/22/2016   Procedure: CATARACT EXTRACTION PHACO AND INTRAOCULAR LENS PLACEMENT LEFT EYE CDE=10.69;   Surgeon: Tonny Branch, MD;  Location: [facility];  Service: Ophthalmology;  Laterality: Left;  left -    COLONOSCOPY  2011   [facility]: normal colon, normal distal ileum   COLONOSCOPY WITH PROPOFOL N/A 07/21/2014   Procedure: ATTEMPTED COLONOSCOPY WITH PROPOFOL-HAD TO STOP DUE TO BRADYCARDIA;  Surgeon: Daneil Dolin, MD;  Location: [facility];  Service: Endoscopy;  Laterality: N/A;   EGD with enteroscopy  2011   [facility]: normal esophagus and stomach. Normal duodenum, jejunum. No evidence of AVMs.    ESOPHAGOGASTRODUODENOSCOPY (EGD) WITH PROPOFOL N/A 07/21/2014   Procedure: ESOPHAGOGASTRODUODENOSCOPY (EGD) WITH PROPOFOL;  Surgeon: Daneil Dolin, MD;  Location: [facility];  Service: Endoscopy;  Laterality: N/A;   HERNIA REPAIR     KNEE ARTHROSCOPY WITH MEDIAL MENISECTOMY Left 11/06/2012   Procedure: KNEE ARTHROSCOPY WITH MEDIAL MENISECTOMY;  Surgeon: Carole Civil, MD;  Location: [facility];  Service: Orthopedics;  Laterality: Left;   Lipoma removal     Stomach   LUMBAR LAMINECTOMY/DECOMPRESSION MICRODISCECTOMY Left 08/30/2013   Procedure: LUMBAR LAMINECTOMY/DECOMPRESSION MICRODISCECTOMY LEFT  LUMBAR TWO THREE;  Surgeon: Otilio Connors, MD;  Location: [facility];  Service: Neurosurgery;  Laterality: Left;   RIGHT/LEFT HEART CATH AND CORONARY ANGIOGRAPHY N/A 09/22/2019   Procedure: RIGHT/LEFT HEART CATH AND CORONARY ANGIOGRAPHY;  Surgeon: Martinique, Peter M, MD;  Location: [facility];  Service: Cardiovascular;  Laterality: N/A;   SHOULDER SURGERY     Rght-rotator cuff   TOOTH EXTRACTION      Prior to Admission medications   Medication Sig Start Date End Date Taking? Authorizing Provider  acetaminophen (TYLENOL) 325 MG tablet Take 2 tablets (650 mg total) by mouth every 6 (six) hours as needed. 10/01/22 10/01/23 Yes Mercy Riding, MD  allopurinol (ZYLOPRIM) 100 MG tablet Take 100 mg by mouth daily.   Yes [provider]  apixaban (ELIQUIS) 5 MG TABS tablet Take 1 tablet (5 mg total) by mouth 2  (two) times daily. 10/01/22  Yes Mercy Riding, MD  aspirin 81 MG chewable tablet Chew 81 mg by mouth daily.   Yes [provider]  bumetanide (BUMEX) 1 MG tablet Take 1 mg by mouth See admin instructions. Take 3 tablets (23m) by mouth daily 11/05/22  Yes [provider]  carvedilol (COREG) 12.5 MG tablet Take 12.5 mg by mouth 2 (two) times daily. 11/12/22  Yes [provider]  Cholecalciferol (VITAMIN D3) 10 MCG (400 UNIT) tablet Take 400 Units by mouth daily.   Yes [provider]  Ferrous Sulfate (IRON) 325 (65 FE) MG TABS Take 325 mg by mouth daily.   Yes [provider]  folic acid (FOLVITE) 1 MG tablet Take 1 mg by mouth daily. 11/05/22  Yes [provider]  gabapentin (NEURONTIN) 400 MG capsule Take 1 capsule (400 mg total) by mouth 3 (three) times daily. 03/04/18  Yes Ko Olina, KModena Nunnery MD  LANTUS SOLOSTAR 100 UNIT/ML Solostar Pen Inject 10 Units into the skin at bedtime. 10/29/22  Yes [provider]  levETIRAcetam (KEPPRA) 1000 MG tablet Take 1 tablet (1,000 mg total) by mouth 2 (two) times daily. 10/01/22  Yes GWendee Beavers  T, MD  levothyroxine (SYNTHROID) 100 MCG tablet Take 100 mcg by mouth daily before breakfast.   Yes [provider]  loperamide (IMODIUM A-D) 2 MG tablet Take 2 mg by mouth daily as needed for diarrhea or loose stools.   Yes [provider]  magnesium oxide (MAG-OX) 400 MG tablet Take 1 tablet (400 mg total) by mouth 2 (two) times daily. 11/19/17  Yes Imogene Burn, PA-C  metFORMIN (GLUCOPHAGE) 1000 MG tablet TAKE ONE TABLET BY MOUTH TWICE DAILY WITH A MEAL. Patient taking differently: Take 1,000 mg by mouth 2 (two) times daily with a meal. 09/24/19  Yes Sande Rives E, PA-C  metolazone (ZAROXOLYN) 2.5 MG tablet Two Times Weekly on Monday and Thursday Patient taking differently: Take 2.5 mg by mouth 2 (two) times a week. Monday and Thursday 02/08/20  Yes BranchAlphonse Guild, MD  Multiple Vitamin  (MULTIVITAMIN WITH MINERALS) TABS tablet Take 1 tablet by mouth daily. 10/02/22  Yes Mercy Riding, MD  naproxen (NAPROSYN) 500 MG tablet Take 500 mg by mouth 2 (two) times daily. 11/05/22  Yes [provider]  pantoprazole (PROTONIX) 40 MG tablet Take 1 tablet (40 mg total) by mouth daily. 02/08/20  Yes Branch, Alphonse Guild, MD  polyethylene glycol powder (MIRALAX) 17 GM/SCOOP powder Take 17 g by mouth 2 (two) times daily as needed for moderate constipation or mild constipation. 10/01/22  Yes Mercy Riding, MD  potassium chloride (KLOR-CON) 10 MEQ tablet Take 10 mEq by mouth 2 (two) times daily.   Yes [provider]  pravastatin (PRAVACHOL) 40 MG tablet TAKE ONE TABLET BY MOUTH DAILY. Patient taking differently: Take 40 mg by mouth daily. 05/21/16  Yes Istachatta, Modena Nunnery, MD  sacubitril-valsartan (ENTRESTO) 49-51 MG Take 1 tablet by mouth 2 (two) times daily. 02/08/20  Yes Branch, Alphonse Guild, MD  senna-docusate (SENOKOT-S) 8.6-50 MG tablet Take 1 tablet by mouth 2 (two) times daily between meals as needed for mild constipation. 10/01/22  Yes Mercy Riding, MD  tamsulosin (FLOMAX) 0.4 MG CAPS capsule TAKE (1) CAPSULE BY MOUTH EVERY DAY. Patient taking differently: Take 0.4 mg by mouth daily. 10/10/21  Yes Lindell Spar, MD  traZODone (DESYREL) 50 MG tablet Take 1 tablet (50 mg total) by mouth at bedtime. TAKE 1 TABLET BY MOUTH AT BEDTIME AS NEEDED FOR SLEEP. 10/01/22  Yes Mercy Riding, MD  carvedilol (COREG) 25 MG tablet Take 1 tablet (25 mg total) by mouth 2 (two) times daily with a meal. Patient not taking: Reported on 11/18/2022 10/01/22 12/30/22  Mercy Riding, MD  insulin glargine-yfgn (SEMGLEE) 100 UNIT/ML Pen  06/18/22   [provider]  metFORMIN (GLUCOPHAGE) 500 MG tablet Take 500 mg by mouth 3 (three) times daily. Patient not taking: Reported on 11/18/2022 10/10/22   [provider]  [facility] powder Apply topically. Patient not taking: Reported on 11/18/2022 10/29/22    [provider]  sodium chloride 1 g tablet Take 1 g by mouth every morning. Patient not taking: Reported on 11/18/2022 10/16/22   [provider]  terbinafine (LAMISIL) 250 MG tablet TAKE ONE TABLET BY MOUTH ONCE DAILY. Patient not taking: Reported on 11/18/2022 08/20/22   Lindell Spar, MD  triamcinolone cream (KENALOG) 0.1 % Apply 1 Application topically 2 (two) times daily. Patient not taking: Reported on 11/18/2022    [provider]  vitamin B-12 (VITAMIN B12) 500 MCG tablet Take 3 tablets (1,500 mcg total) by mouth daily. Patient not taking: Reported  on 11/18/2022 10/02/22   Mercy Riding, MD    Current Facility-Administered Medications  Medication Dose Route Frequency Provider Last Rate Last Admin   0.9 %  sodium chloride infusion  250 mL Intravenous Continuous Emokpae, Ejiroghene E, MD 20 mL/hr at 11/19/22 [id number] Infusion Verify at 11/19/22 0613   0.9 %  sodium chloride infusion  250 mL Intravenous Continuous Emokpae, Ejiroghene E, MD   Held at 11/19/22 0003   acetaminophen (TYLENOL) tablet 650 mg  650 mg Oral Q6H PRN Emokpae, Ejiroghene E, MD       Or   acetaminophen (TYLENOL) suppository 650 mg  650 mg Rectal Q6H PRN Emokpae, Ejiroghene E, MD       allopurinol (ZYLOPRIM) tablet 100 mg  100 mg Oral Daily Emokpae, Ejiroghene E, MD   100 mg at 11/20/22 0909   carvedilol (COREG) tablet 25 mg  25 mg Oral BID WC Shah, Pratik D, DO       cefTRIAXone (ROCEPHIN) 2 g in sodium chloride 0.9 % 100 mL IVPB  2 g Intravenous Q24H Emokpae, Ejiroghene E, MD 200 mL/hr at 11/19/22 1700 2 g at 11/19/22 1700   Chlorhexidine Gluconate Cloth 2 % PADS 6 each  6 each Topical Daily Emokpae, Ejiroghene E, MD   6 each at 11/20/22 0919   Chlorhexidine Gluconate Cloth 2 % PADS 6 each  6 each Topical Q0600 Manuella Ghazi, Pratik D, DO       ferrous sulfate tablet 325 mg  325 mg Oral Q breakfast Shah, Pratik D, DO       folic acid (FOLVITE) tablet 1 mg  1 mg Oral Daily Shah, Pratik D, DO       insulin  aspart (novoLOG) injection 0-9 Units  0-9 Units Subcutaneous Q6H Emokpae, Ejiroghene E, MD   2 Units at 11/20/22 0909   levETIRAcetam (KEPPRA) tablet 1,000 mg  1,000 mg Oral BID Manuella Ghazi, Pratik D, DO       levothyroxine (SYNTHROID) tablet 100 mcg  100 mcg Oral QAC breakfast Emokpae, Ejiroghene E, MD   100 mcg at 11/20/22 [id number]   mupirocin ointment (BACTROBAN) 2 % 1 Application  1 Application Nasal BID Manuella Ghazi, Pratik D, DO   1 Application at [id number]   ondansetron (ZOFRAN) tablet 4 mg  4 mg Oral Q6H PRN Emokpae, Ejiroghene E, MD       Or   ondansetron (ZOFRAN) injection 4 mg  4 mg Intravenous Q6H PRN Emokpae, Ejiroghene E, MD       pantoprazole (PROTONIX) injection 40 mg  40 mg Intravenous Q24H Emokpae, Ejiroghene E, MD   40 mg at 11/19/22 1951   polyethylene glycol (MIRALAX / GLYCOLAX) packet 17 g  17 g Oral Daily PRN Emokpae, Ejiroghene E, MD       tamsulosin (FLOMAX) capsule 0.4 mg  0.4 mg Oral Daily Emokpae, Ejiroghene E, MD   0.4 mg at 11/20/22 0910    Allergies as of 11/18/2022   (No Known Allergies)    Family History  Problem Relation Age of Onset   Heart disease Mother    Hyperlipidemia Mother    Hypertension Mother    Depression Mother    Diabetes Mother    Rectal cancer Mother    Cancer Mother    Heart disease Father    Hypertension Father    Hyperlipidemia Father    Diabetes Father    Cancer Father    Heart disease Sister    Hyperlipidemia Sister    Hypertension Sister  Diabetes Sister    Diabetes Brother    Heart disease Sister    Hyperlipidemia Sister    Hypertension Sister    Heart disease Sister    Hyperlipidemia Sister    Hypertension Sister    Diabetes Sister    Diabetes Brother     Social History   Socioeconomic History   Marital status: Widowed    Spouse name: Not on file   Number of children: Not on file   Years of education: 12   Highest education level: Not on file  Occupational History   Not on file  Tobacco Use   Smoking status: Former     Packs/day: 2.50    Years: 44.00    Total pack years: 110.00    Types: Cigarettes    Start date: 05/07/1955    Quit date: 10/01/1999    Years since quitting: 23.1   Smokeless tobacco: Never  Vaping Use   Vaping Use: Never used  Substance and Sexual Activity   Alcohol use: No    Alcohol/week: 0.0 standard drinks of alcohol   Drug use: No   Sexual activity: Yes    Birth control/protection: None  Other Topics Concern   Not on file  Social History Narrative   Not on file   Social Determinants of Health   Financial Resource Strain: Low Risk  (04/18/2022)   Overall Financial Resource Strain (CARDIA)    Difficulty of Paying Living Expenses: Not hard at all  Food Insecurity: No Food Insecurity (09/19/2022)   Hunger Vital Sign    Worried About Running Out of Food in the Last Year: Never true    [place] in the Last Year: Never true  Transportation Needs: No Transportation Needs (09/19/2022)   PRAPARE - Hydrologist (Medical): No    Lack of Transportation (Non-Medical): No  Physical Activity: Inactive (04/18/2022)   Exercise Vital Sign    Days of Exercise per Week: 0 days    Minutes of Exercise per Session: 0 min  Stress: No Stress Concern Present (04/18/2022)   [place]    Feeling of Stress : Not at all  Social Connections: Moderately Isolated (04/18/2022)   Social Connection and Isolation Panel [NHANES]    Frequency of Communication with Friends and Family: More than three times a week    Frequency of Social Gatherings with Friends and Family: More than three times a week    Attends Religious Services: 1 to 4 times per year    Active Member of [employer] or Organizations: No    Attends Archivist Meetings: Never    Marital Status: Widowed  Intimate Partner Violence: Not At Risk (09/19/2022)   Humiliation, Afraid, Rape, and Kick questionnaire    Fear of Current or  Ex-Partner: No    Emotionally Abused: No    Physically Abused: No    Sexually Abused: No     Review of Systems   Gen: Denies any fever, chills, loss of appetite, change in weight or weight loss CV: Denies chest pain, heart palpitations, syncope, edema  Resp: Denies shortness of breath with rest, cough, wheezing, coughing up blood, and pleurisy. GI:  denies melena, hematochezia, nausea, vomiting, constipation, dysphagia, odyonophagia, early satiety or weight loss. +periumbilical pain +loose stools GU : Denies urinary burning, blood in urine, urinary frequency, and urinary incontinence. MS: Denies joint pain, limitation of movement, swelling, cramps, and atrophy.  Derm: Denies rash,  itching, dry skin, hives. Psych: Denies depression, anxiety, memory loss, hallucinations, and confusion. Heme: Denies bruising or bleeding Neuro:  Denies any headaches, dizziness, paresthesias, shaking  Physical Exam   Vital Signs in last 24 hours: Temp:  [97.5 F (36.4 C)-97.6 F (36.4 C)] 97.5 F (36.4 C) (02/21 0740) Pulse Rate:  [68-95] 85 (02/21 0909) Resp:  [13-28] 16 (02/21 0400) BP: (81-156)/(51-137) 149/57 (02/21 0909) SpO2:  [95 %-100 %] 97 % (02/21 0400) Arterial Line BP: (134-172)/(49-87) 167/82 (02/21 0400) Weight:  [79.3 kg] 79.3 kg (02/21 0500)   General:   Alert,  Well-developed, ill appearing, pleasant and cooperative Head:  Normocephalic and atraumatic. Eyes:  Sclera clear, no icterus.   Conjunctiva pink. Ears:  Normal auditory acuity. Mouth:  No deformity or lesions, dentition normal. Lungs:  Clear throughout to auscultation.   No wheezes, crackles, or rhonchi. No acute distress. Heart:  Regular rate and rhythm; no murmurs, clicks, rubs,  or gallops. Abdomen:  Soft, nontender and nondistended. No masses, hepatosplenomegaly or hernias noted. Normal bowel sounds, without guarding, and without rebound.   Msk:  Symmetrical without gross deformities. Normal posture. Extremities:   Without clubbing or edema. Neurologic:  Alert and oriented to person, place disoriented to time  Skin:  Intact without significant lesions or rashes. Psych:  Alert and cooperative. Normal mood and affect.  Intake/Output from previous day: 02/20 0701 - 02/21 0700 In: 60 [P.O.:60] Out: 1675 [Urine:1675] Intake/Output this shift: No intake/output data recorded.   Labs/Studies   Recent Labs Recent Labs    11/19/22 0024 11/19/22 0421 11/20/22 0439  WBC 8.8 10.7* 4.4  HGB 10.6* 10.9* 9.8*  HCT 31.7* 33.2* 29.8*  PLT 261 270 254   BMET Recent Labs    11/19/22 0024 11/19/22 0421 11/20/22 0439  NA 135 138 139  K 5.3* 4.8 3.6  CL 110 114* 114*  CO2 16* 13* 16*  GLUCOSE 108* 136* 175*  BUN 77* 66* 50*  CREATININE 2.36* 2.05* 1.58*  CALCIUM 7.7* 7.5* 7.7*   LFT Recent Labs    11/18/22 1118 11/19/22 0024  PROT 5.7* 4.9*  ALBUMIN 2.1* 1.7*  AST 22 23  ALT 21 19  ALKPHOS 113 92  BILITOT 1.2 1.1   PT/INR Recent Labs    11/18/22 1118  LABPROT 30.1*  INR 2.9*   DG Chest Port 1 View  Result Date: 11/19/2022 CLINICAL DATA:  Check central line placement EXAM: PORTABLE CHEST 1 VIEW COMPARISON:  11/18/2022 FINDINGS: Cardiac shadow is stable. Right jugular central line is noted catheter tip at cavoatrial junction. No evidence of pneumothorax is seen. Mild central vascular congestion is noted. No bony abnormality is seen. IMPRESSION: Central line in satisfactory position. No definitive pneumothorax is noted. Mild central vascular congestion. Electronically Signed   By: Inez Catalina M.D.   On: 11/19/2022 01:56   DG Chest Port 1 View  Result Date: 11/18/2022 CLINICAL DATA:  Altered mental status EXAM: PORTABLE CHEST 1 VIEW COMPARISON:  09/21/2022 x-ray and older.  CT angiogram 09/23/2022 FINDINGS: The heart size and mediastinal contours are within normal limits. Both lungs are clear. The visualized skeletal structures are unremarkable. Calcified aorta. Overlapping gown snaps.  Lordotic x-ray. IMPRESSION: No active disease. Electronically Signed   By: Jill Side M.D.   On: 11/18/2022 12:23     Assessment   ALDYN SWENOR is a 77 y.o. year old male with medical history significant for atrial fibrillation on chronic anticoagulation, hypertension, gout, diabetes mellitus, COPD, CHF, Nash. Patient was  brought to the ED from nursing home via EMS reports of AMS He was admitted with acute metabolic encephalopathy as well as AKI in the setting of septic shock secondary to UTI (Klebsiella) requiring pressors for BP support previously. GI consulted for anemia.  Anemia/heme positive stool: last colonoscopy in 2015, incomplete due to development of bradycardia, EGD at that time with mild chronic gastritis thought secondary to NSAID use. No overt bleeding this admission. Hgb stable in 9-10 range. I did connect with patient's POA Michael [name] to discuss patient's anemia and heme positive stool. We discussed typical evaluation with EGD/Colonoscopy in stable patients with anemia, however, given patient's multimorbidities and recent acute illness, patient and family have agreed to proceed with hospice care upon patient's discharge, they do not want any invasive procedures, to include endoscopic evaluation at this time. I think this Is a very reasonable choice to conservatively manage patient's anemia, given his current health status. Would recommend continuing to monitor for overt GI bleeding, trend h&h and transfuse as needed for hemoglobin less than 7, can continue with PO iron and PPI daily.    Plan / Recommendations    Trend h&h Transfuse for hgb <7 Monitor for overt GI bleeding Avoid NSAIDs  5. Continue PO iron 6. PPI daily    11/20/2022, 11:17 AM Sie Formisano L. Alver Sorrow, MSN, APRN, AGNP-C Adult-Gerontology Nurse Practitioner [facility] at [facility]

## 2022-11-20 NOTE — Progress Notes (Signed)
IJ central line and right radial arterial line have been removed. Both catheters were intact upon removal. Bleeding has stopped and dressings are in place. While removing the arterial line dressing a skin tear occurred on the posterior wrist. It is round and about the size of a quarter. Dressing is in place and it was added to pt's chart. Vitals signs remain stable and no change in mental status is present.

## 2022-11-21 DIAGNOSIS — R6520 Severe sepsis without septic shock: Secondary | ICD-10-CM | POA: Diagnosis not present

## 2022-11-21 DIAGNOSIS — A419 Sepsis, unspecified organism: Secondary | ICD-10-CM | POA: Diagnosis not present

## 2022-11-21 LAB — CBC
HCT: 32 % — ABNORMAL LOW (ref 39.0–52.0)
Hemoglobin: 10.7 g/dL — ABNORMAL LOW (ref 13.0–17.0)
MCH: 28.3 pg (ref 26.0–34.0)
MCHC: 33.4 g/dL (ref 30.0–36.0)
MCV: 84.7 fL (ref 80.0–100.0)
Platelets: 276 10*3/uL (ref 150–400)
RBC: 3.78 MIL/uL — ABNORMAL LOW (ref 4.22–5.81)
RDW: 17.5 % — ABNORMAL HIGH (ref 11.5–15.5)
WBC: 5.8 10*3/uL (ref 4.0–10.5)
nRBC: 0 % (ref 0.0–0.2)

## 2022-11-21 LAB — BASIC METABOLIC PANEL
Anion gap: 10 (ref 5–15)
BUN: 41 mg/dL — ABNORMAL HIGH (ref 8–23)
CO2: 18 mmol/L — ABNORMAL LOW (ref 22–32)
Calcium: 8.1 mg/dL — ABNORMAL LOW (ref 8.9–10.3)
Chloride: 115 mmol/L — ABNORMAL HIGH (ref 98–111)
Creatinine, Ser: 1.36 mg/dL — ABNORMAL HIGH (ref 0.61–1.24)
GFR, Estimated: 54 mL/min — ABNORMAL LOW (ref >60)
Glucose, Bld: 153 mg/dL — ABNORMAL HIGH (ref 70–99)
Potassium: 3.5 mmol/L (ref 3.5–5.1)
Sodium: 143 mmol/L (ref 135–145)

## 2022-11-21 LAB — MAGNESIUM: Magnesium: 1.7 mg/dL (ref 1.7–2.4)

## 2022-11-21 LAB — GLUCOSE, CAPILLARY
Glucose-Capillary: 157 mg/dL — ABNORMAL HIGH (ref 70–99)
Glucose-Capillary: 157 mg/dL — ABNORMAL HIGH (ref 70–99)
Glucose-Capillary: 175 mg/dL — ABNORMAL HIGH (ref 70–99)

## 2022-11-21 MED ORDER — SODIUM CHLORIDE 0.9 % IV SOLN: 2.0000 g | INTRAVENOUS | Status: DC

## 2022-11-21 NOTE — NC FL2 (Signed)
[place] LEVEL OF CARE FORM     IDENTIFICATION  Patient Name: Michael [name] Birthdate: 06-29-1946 Sex: male Admission Date (Current Location): 11/18/2022  [facility] and [place] Number:  [employer] and Address:  [facility] [address], [place]      Provider Number: [id number]  Attending Physician Name and Address:  Rodena Goldmann, DO  Relative Name and Phone Number:       Current Level of Care: Hospital Recommended Level of Care: Assisted Living Facility Prior Approval Number:    Date Approved/Denied:   PASRR Number:    Discharge Plan: Other (Comment) (ALF)    Current Diagnoses: Patient Active Problem List   Diagnosis Date Noted   UTI (urinary tract infection) 11/18/2022   AKI (acute kidney injury) ([place]) 11/18/2022   Severe sepsis (Home) 0000000   Acute metabolic encephalopathy 0000000   Tonic-clonic seizure ([place]) 10/01/2022   Pressure injury of skin 10/01/2022   Paroxysmal atrial fibrillation with RVR ([place]) 10/01/2022   Venous stasis dermatitis 10/01/2022   Disorientation 09/19/2022   Hypertensive crisis 09/18/2022   Encounter for general adult medical examination with abnormal findings 06/20/2022   Onychomycosis 06/20/2022   Physical deconditioning 12/21/2021   Acute cystitis without hematuria 07/10/2021   Benign prostatic hyperplasia with urinary frequency 07/10/2021   Vitamin D deficiency 05/23/2021   Anxiety 01/11/2021   Chronic obstructive lung disease ([place]) 01/11/2021   Contact dermatitis and other eczema 01/11/2021   Insulin dependent type 2 diabetes mellitus ([place]) 01/11/2021   Diabetic peripheral neuropathy ([place]) 01/11/2021   Gout 01/11/2021   History of colonic polyps 01/11/2021   Ventral hernia 01/11/2021   Neck pain 11/20/2020   Lymphedema of lower extremity 11/18/2018   Chronic pain syndrome 05/06/2018   Depression, major, single episode, mild ([place]) 06/20/2017   DNR (do not resuscitate)  06/20/2017   Chronic combined systolic and diastolic heart failure ([place]) 10/23/2016   Hypothyroidism 11/30/2014   Sinus pause 07/21/2014   Difficulty walking 05/03/2014   Acute anemia 07/14/2013   Spinal stenosis of lumbar region 07/06/2013   Carpal tunnel syndrome 04/13/2013   DDD (degenerative disc disease), lumbosacral 04/13/2013   Peripheral neuropathy 01/05/2013   S/P arthroscopy of left knee 11/09/2012   Osteoarthritis of knee 10/07/2012   NASH (nonalcoholic steatohepatitis) 05/22/2007   Hyperlipidemia 10/15/2006   Class 3 obesity ([place]) 10/15/2006   Hypertension 10/15/2006   Gastroesophageal reflux disease 10/15/2006    Orientation RESPIRATION BLADDER Height & Weight     Self  Normal Indwelling catheter Weight: 174 lb 13.2 oz (79.3 kg) Height:  5' 7"$  (170.2 cm)  BEHAVIORAL SYMPTOMS/MOOD NEUROLOGICAL BOWEL NUTRITION STATUS      Incontinent Diet (Heart healthy/carb modified)  AMBULATORY STATUS COMMUNICATION OF NEEDS Skin   Extensive Assist Verbally Other (Comment) (Redness to buttocks. Deep tissue pressure injury to buttocks.)                       Personal Care Assistance Level of Assistance  Bathing, Feeding, Dressing Bathing Assistance: Limited assistance Feeding assistance: Limited assistance Dressing Assistance: Limited assistance     Functional Limitations Info  Sight, Hearing, Speech Sight Info: Impaired Hearing Info: Impaired Speech Info: Impaired    SPECIAL CARE FACTORS FREQUENCY  PT (By licensed PT), OT (By licensed OT)     PT Frequency: [place] OT Frequency: [place]            Contractures  Additional Factors Info  Code Status, Allergies, Psychotropic Code Status Info: DNR Allergies Info: No known allergies Psychotropic Info: Trazodone         Current Medications (11/21/2022):  This is the current hospital active medication list Current Facility-Administered Medications  Medication Dose Route Frequency  Provider Last Rate Last Admin   0.9 %  sodium chloride infusion  250 mL Intravenous Continuous Emokpae, Ejiroghene E, MD 20 mL/hr at 11/19/22 [id number] Infusion Verify at 11/19/22 0613   0.9 %  sodium chloride infusion  250 mL Intravenous Continuous Emokpae, Ejiroghene E, MD   Held at 11/19/22 0003   acetaminophen (TYLENOL) tablet 650 mg  650 mg Oral Q6H PRN Emokpae, Ejiroghene E, MD       Or   acetaminophen (TYLENOL) suppository 650 mg  650 mg Rectal Q6H PRN Emokpae, Ejiroghene E, MD       allopurinol (ZYLOPRIM) tablet 100 mg  100 mg Oral Daily Emokpae, Ejiroghene E, MD   100 mg at 11/21/22 0842   carvedilol (COREG) tablet 25 mg  25 mg Oral BID WC Manuella Ghazi, Pratik D, DO   25 mg at 11/21/22 0841   cefTRIAXone (ROCEPHIN) 2 g in sodium chloride 0.9 % 100 mL IVPB  2 g Intravenous Q24H Manuella Ghazi, Pratik D, DO       Chlorhexidine Gluconate Cloth 2 % PADS 6 each  6 each Topical Daily Emokpae, Ejiroghene E, MD   6 each at 11/21/22 0842   Chlorhexidine Gluconate Cloth 2 % PADS 6 each  6 each Topical Q0600 Heath Lark D, DO   6 each at 11/21/22 [id number]   ferrous sulfate tablet 325 mg  325 mg Oral Q breakfast Heath Lark D, DO   325 mg at [id number]   folic acid (FOLVITE) tablet 1 mg  1 mg Oral Daily Manuella Ghazi, Pratik D, DO   1 mg at 11/21/22 0841   insulin aspart (novoLOG) injection 0-5 Units  0-5 Units Subcutaneous QHS Manuella Ghazi, Pratik D, DO       insulin aspart (novoLOG) injection 0-9 Units  0-9 Units Subcutaneous TID WC Manuella Ghazi, Pratik D, DO   2 Units at 11/21/22 0841   levETIRAcetam (KEPPRA) tablet 1,000 mg  1,000 mg Oral BID Manuella Ghazi, Pratik D, DO   1,000 mg at 11/21/22 [id number]   levothyroxine (SYNTHROID) tablet 100 mcg  100 mcg Oral QAC breakfast Emokpae, Ejiroghene E, MD   100 mcg at 11/21/22 0513   mupirocin ointment (BACTROBAN) 2 % 1 Application  1 Application Nasal BID Manuella Ghazi, Pratik D, DO   1 Application at [id number]   ondansetron (ZOFRAN) tablet 4 mg  4 mg Oral Q6H PRN Emokpae, Ejiroghene E, MD       Or   ondansetron  (ZOFRAN) injection 4 mg  4 mg Intravenous Q6H PRN Emokpae, Ejiroghene E, MD       pantoprazole (PROTONIX) injection 40 mg  40 mg Intravenous Q24H Emokpae, Ejiroghene E, MD   40 mg at 11/20/22 1939   polyethylene glycol (MIRALAX / GLYCOLAX) packet 17 g  17 g Oral Daily PRN Emokpae, Ejiroghene E, MD       tamsulosin (FLOMAX) capsule 0.4 mg  0.4 mg Oral Daily Emokpae, Ejiroghene E, MD   0.4 mg at 11/21/22 0841     Discharge Medications:  STOP taking these medications     apixaban 5 MG Tabs tablet Commonly known as: ELIQUIS    aspirin 81 MG chewable tablet    naproxen 500 MG tablet Commonly known as: NAPROSYN  TAKE these medications     acetaminophen 325 MG tablet Commonly known as: Tylenol Take 2 tablets (650 mg total) by mouth every 6 (six) hours as needed.    allopurinol 100 MG tablet Commonly known as: ZYLOPRIM Take 100 mg by mouth daily.    bumetanide 1 MG tablet Commonly known as: BUMEX Take 1 mg by mouth See admin instructions. Take 3 tablets (78m) by mouth daily    carvedilol 25 MG tablet Commonly known as: COREG Take 1 tablet (25 mg total) by mouth 2 (two) times daily with a meal.    carvedilol 12.5 MG tablet Commonly known as: COREG Take 12.5 mg by mouth 2 (two) times daily.    cyanocobalamin 500 MCG tablet Commonly known as: VITAMIN B12 Take 3 tablets (1,500 mcg total) by mouth daily.    Entresto 49-51 MG Generic drug: sacubitril-valsartan Take 1 tablet by mouth 2 (two) times daily.    folic acid 1 MG tablet Commonly known as: FOLVITE Take 1 mg by mouth daily.    gabapentin 400 MG capsule Commonly known as: NEURONTIN Take 1 capsule (400 mg total) by mouth 3 (three) times daily.    insulin glargine-yfgn 100 UNIT/ML Pen Commonly known as: SEMGLEE    Iron 325 (65 Fe) MG Tabs Take 325 mg by mouth daily.    Lantus SoloStar 100 UNIT/ML Solostar Pen Generic drug: insulin glargine Inject 10 Units into the skin at bedtime.    levETIRAcetam  1000 MG tablet Commonly known as: KEPPRA Take 1 tablet (1,000 mg total) by mouth 2 (two) times daily.    levothyroxine 100 MCG tablet Commonly known as: SYNTHROID Take 100 mcg by mouth daily before breakfast.    loperamide 2 MG tablet Commonly known as: IMODIUM A-D Take 2 mg by mouth daily as needed for diarrhea or loose stools.    magnesium oxide 400 MG tablet Commonly known as: MAG-OX Take 1 tablet (400 mg total) by mouth 2 (two) times daily.    metFORMIN 1000 MG tablet Commonly known as: GLUCOPHAGE TAKE ONE TABLET BY MOUTH TWICE DAILY WITH A MEAL. What changed:  how much to take how to take this when to take this additional instructions    metFORMIN 500 MG tablet Commonly known as: GLUCOPHAGE Take 500 mg by mouth 3 (three) times daily. What changed: Another medication with the same name was changed. Make sure you understand how and when to take each.    metolazone 2.5 MG tablet Commonly known as: ZAROXOLYN Two Times Weekly on Monday and Thursday What changed:  how much to take how to take this when to take this additional instructions    multivitamin with minerals Tabs tablet Take 1 tablet by mouth daily.    Nyamyc powder Generic drug: nystatin Apply topically.    pantoprazole 40 MG tablet Commonly known as: PROTONIX Take 1 tablet (40 mg total) by mouth daily.    polyethylene glycol powder 17 GM/SCOOP powder Commonly known as: MiraLax Take 17 g by mouth 2 (two) times daily as needed for moderate constipation or mild constipation.    potassium chloride 10 MEQ tablet Commonly known as: KLOR-CON Take 10 mEq by mouth 2 (two) times daily.    pravastatin 40 MG tablet Commonly known as: PRAVACHOL TAKE ONE TABLET BY MOUTH DAILY.    senna-docusate 8.6-50 MG tablet Commonly known as: Senokot-S Take 1 tablet by mouth 2 (two) times daily between meals as needed for mild constipation.    sodium chloride 1 g tablet Take 1  g by mouth every morning.     tamsulosin 0.4 MG Caps capsule Commonly known as: FLOMAX TAKE (1) CAPSULE BY MOUTH EVERY DAY. What changed: See the new instructions.    terbinafine 250 MG tablet Commonly known as: LAMISIL TAKE ONE TABLET BY MOUTH ONCE DAILY.    traZODone 50 MG tablet Commonly known as: DESYREL Take 1 tablet (50 mg total) by mouth at bedtime. TAKE 1 TABLET BY MOUTH AT BEDTIME AS NEEDED FOR SLEEP.    triamcinolone cream 0.1 % Commonly known as: KENALOG Apply 1 Application topically 2 (two) times daily.    Vitamin D3 10 MCG (400 UNIT) Tabs tablet Take 400 Units by mouth daily.      Relevant Imaging Results:  Relevant Lab Results:   Additional Information [facility], LCSW

## 2022-11-21 NOTE — Plan of Care (Signed)
  Problem: Education: Goal: Ability to describe self-care measures that may prevent or decrease complications (Diabetes Survival Skills Education) will improve Outcome: Adequate for Discharge Goal: Individualized Educational Video(s) Outcome: Adequate for Discharge   Problem: Coping: Goal: Ability to adjust to condition or change in health will improve Outcome: Adequate for Discharge   Problem: Fluid Volume: Goal: Ability to maintain a balanced intake and output will improve Outcome: Adequate for Discharge   Problem: Health Behavior/Discharge Planning: Goal: Ability to identify and utilize available resources and services will improve Outcome: Adequate for Discharge Goal: Ability to manage health-related needs will improve Outcome: Adequate for Discharge   Problem: Metabolic: Goal: Ability to maintain appropriate glucose levels will improve Outcome: Adequate for Discharge   Problem: Nutritional: Goal: Maintenance of adequate nutrition will improve Outcome: Adequate for Discharge Goal: Progress toward achieving an optimal weight will improve Outcome: Adequate for Discharge   Problem: Skin Integrity: Goal: Risk for impaired skin integrity will decrease Outcome: Adequate for Discharge   Problem: Tissue Perfusion: Goal: Adequacy of tissue perfusion will improve Outcome: Adequate for Discharge   Problem: Education: Goal: Knowledge of General Education information will improve Description: Including pain rating scale, medication(s)/side effects and non-pharmacologic comfort measures Outcome: Adequate for Discharge   Problem: Health Behavior/Discharge Planning: Goal: Ability to manage health-related needs will improve Outcome: Adequate for Discharge   Problem: Clinical Measurements: Goal: Ability to maintain clinical measurements within normal limits will improve Outcome: Adequate for Discharge Goal: Will remain free from infection Outcome: Adequate for Discharge Goal:  Diagnostic test results will improve Outcome: Adequate for Discharge Goal: Respiratory complications will improve Outcome: Adequate for Discharge Goal: Cardiovascular complication will be avoided Outcome: Adequate for Discharge   Problem: Activity: Goal: Risk for activity intolerance will decrease Outcome: Adequate for Discharge   Problem: Nutrition: Goal: Adequate nutrition will be maintained Outcome: Adequate for Discharge   Problem: Coping: Goal: Level of anxiety will decrease Outcome: Adequate for Discharge   Problem: Elimination: Goal: Will not experience complications related to bowel motility Outcome: Adequate for Discharge Goal: Will not experience complications related to urinary retention Outcome: Adequate for Discharge   Problem: Pain Managment: Goal: General experience of comfort will improve Outcome: Adequate for Discharge   Problem: Safety: Goal: Ability to remain free from injury will improve Outcome: Adequate for Discharge   Problem: Skin Integrity: Goal: Risk for impaired skin integrity will decrease Outcome: Adequate for Discharge   Problem: Acute Rehab PT Goals(only PT should resolve) Goal: Pt Will Go Supine/Side To Sit Outcome: Adequate for Discharge Goal: Patient Will Transfer Sit To/From Stand Outcome: Adequate for Discharge Goal: Pt Will Transfer Bed To Chair/Chair To Bed Outcome: Adequate for Discharge Goal: Pt Will Ambulate Outcome: Adequate for Discharge

## 2022-11-21 NOTE — TOC Transition Note (Signed)
Transition of Care [facility]) - CM/SW Discharge Note   Patient Details  Name: ANTONEO LAVELLE MRN: [id number] Date of Birth: [birth year]  [facility] CM/SW Contact:  Shade Flood, LCSW Phone Number: 11/21/2022, 11:55 AM   Clinical Narrative:     TOC following. Pt stable for dc back to ALF with hospice as planned per MD. Damaris Schooner with Marchia Meiers at [facility]. Spoke with pt's niece who confirms dc plan, and updated Juliann Pulse at [facility]. Faxed dc clinical. EMS arranged.  Updated Sarah at [facility] that pt would be going back with hospice.  No further dc needs at this time.  Final next level of care: Assisted Living Barriers to Discharge: Barriers Resolved   Patient Goals and CMS Choice CMS Medicare.gov Compare Post Acute Care list provided to:: Patient Represenative (must comment) (niece) Choice offered to / list presented to : [facility] POA / Guardian (niece)  Discharge Placement                         Discharge Plan and Services Additional resources added to the After Visit Summary for   In-house Referral: Clinical Social Work   Post Acute Care Choice: Resumption of Svcs/PTA Provider                    HH Arranged: PT, OT [facility] Agency: [place] Date [place]: 11/21/22 Time [place]: 1101 Representative spoke with at [place]: [place] Determinants of Health (Central) Interventions SDOH Screenings   Food Insecurity: No Food Insecurity (09/19/2022)  Housing: Low Risk  (09/19/2022)  Transportation Needs: No Transportation Needs (09/19/2022)  Utilities: Not At Risk (09/19/2022)  Alcohol Screen: Low Risk  (04/18/2022)  Depression (PHQ2-9): Low Risk  (06/20/2022)  Financial Resource Strain: Low Risk  (04/18/2022)  Physical Activity: Inactive (04/18/2022)  Social Connections: Moderately Isolated (04/18/2022)  Stress: No Stress Concern Present (04/18/2022)  Tobacco Use: Medium Risk (11/19/2022)     Readmission Risk  Interventions    11/19/2022   11:00 AM  Readmission Risk Prevention Plan  Transportation Screening Complete  Medication Review (RN Care Manager) Complete  HRI or Home Care Consult Complete  SW Recovery Care/Counseling Consult Complete  Palliative Care Screening Not [place] Not Applicable

## 2022-11-21 NOTE — Progress Notes (Signed)
Pt noted with confusion throughout the night. Frequently yelling out or calling nurses station requesting to leave to "go across the street to meet my sister". Requiring frequent redirection and reorientation throughout the night. Pt noted to be agitated at times with reorientation to surroundings and situation. Pt noted be more cooperative and lucid this monring.

## 2022-11-21 NOTE — Discharge Summary (Signed)
Physician Discharge Summary  Michael [name] [id number] DOB: [birth year] DOA: 11/18/2022  PCP: [facility]  Admit date: 11/18/2022  Discharge date: 11/21/2022  Admitted From:ALF  Disposition:  ALF with outpt hospice  Recommendations for Outpatient Follow-up:  Follow up with PCP in 1-2 weeks Continue on medications as noted below and hold further use of aspirin and Eliquis given concerns for potential GI bleed Completed course of Rocephin for sepsis with UTI and no further need of antibiotics noted  Home Health: Yes with PT, outpatient hospice  Equipment/Devices: None  Discharge Condition:Stable  CODE STATUS: DNR  Diet recommendation: Heart Healthy/carb modified  Brief/Interim Summary:  Michael [name] is a 77 y.o. male with medical history significant for atrial fibrillation on chronic anticoagulation, hypertension, gout, diabetes mellitus, COPD, CHF, [place]. Patient was brought to the ED from nursing home via EMS reports of altered mental status.  He was admitted with acute metabolic encephalopathy as well as AKI in the setting of septic shock secondary to UTI.  He was empirically started on Rocephin, vancomycin, and metronidazole and urine cultures eventually resulted with Klebsiella UTI and he was maintained on Rocephin per sensitivities.  He completed over 3-day course of treatment and does not require any further antibiotics on discharge.  He is in stable condition for discharge at this time with PT recommending home health physical therapy.  He was seen by palliative care with recommendations for outpatient hospice after discussion with his niece who is HCPOA.  No other acute events or concerns noted throughout the course of this admission and he is stable for discharge back to his assisted living facility with outpatient hospice to be initiated.  Discharge Diagnoses:  Principal Problem:   Severe sepsis ([place]) Active Problems:   UTI (urinary tract infection)   AKI  (acute kidney injury) ([place])   Acute metabolic encephalopathy   NASH (nonalcoholic steatohepatitis)   Acute anemia   Hypothyroidism   Chronic combined systolic and diastolic heart failure (HCC)   Chronic obstructive lung disease (HCC)   Diabetic peripheral neuropathy (HCC)   Gout   Tonic-clonic seizure (HCC)   Paroxysmal atrial fibrillation with RVR ([place])  Principal discharge diagnosis: Acute metabolic encephalopathy secondary to septic shock related to Klebsiella UTI with associated AKI.  Paroxysmal atrial fibrillation with RVR.  Discharge Instructions  Discharge Instructions     Diet - low sodium heart healthy   Complete by: As directed    If the dressing is still on your incision site when you go home, remove it on the third day after your surgery date. Remove dressing if it begins to fall off, or if it is dirty or damaged before the third day.   Complete by: As directed    Increase activity slowly   Complete by: As directed       Allergies as of 11/21/2022   No Known Allergies      Medication List     STOP taking these medications    apixaban 5 MG Tabs tablet Commonly known as: ELIQUIS   aspirin 81 MG chewable tablet   naproxen 500 MG tablet Commonly known as: NAPROSYN       TAKE these medications    acetaminophen 325 MG tablet Commonly known as: Tylenol Take 2 tablets (650 mg total) by mouth every 6 (six) hours as needed.   allopurinol 100 MG tablet Commonly known as: ZYLOPRIM Take 100 mg by mouth daily.   bumetanide 1 MG tablet Commonly known as: BUMEX Take 1 mg  by mouth See admin instructions. Take 3 tablets (4m) by mouth daily   carvedilol 25 MG tablet Commonly known as: COREG Take 1 tablet (25 mg total) by mouth 2 (two) times daily with a meal.   carvedilol 12.5 MG tablet Commonly known as: COREG Take 12.5 mg by mouth 2 (two) times daily.   cyanocobalamin 500 MCG tablet Commonly known as: VITAMIN B12 Take 3 tablets (1,500 mcg total) by  mouth daily.   Entresto 49-51 MG Generic drug: sacubitril-valsartan Take 1 tablet by mouth 2 (two) times daily.   folic acid 1 MG tablet Commonly known as: FOLVITE Take 1 mg by mouth daily.   gabapentin 400 MG capsule Commonly known as: NEURONTIN Take 1 capsule (400 mg total) by mouth 3 (three) times daily.   insulin glargine-yfgn 100 UNIT/ML Pen Commonly known as: SEMGLEE   Iron 325 (65 Fe) MG Tabs Take 325 mg by mouth daily.   Lantus SoloStar 100 UNIT/ML Solostar Pen Generic drug: insulin glargine Inject 10 Units into the skin at bedtime.   levETIRAcetam 1000 MG tablet Commonly known as: KEPPRA Take 1 tablet (1,000 mg total) by mouth 2 (two) times daily.   levothyroxine 100 MCG tablet Commonly known as: SYNTHROID Take 100 mcg by mouth daily before breakfast.   loperamide 2 MG tablet Commonly known as: IMODIUM A-D Take 2 mg by mouth daily as needed for diarrhea or loose stools.   magnesium oxide 400 MG tablet Commonly known as: MAG-OX Take 1 tablet (400 mg total) by mouth 2 (two) times daily.   metFORMIN 1000 MG tablet Commonly known as: GLUCOPHAGE TAKE ONE TABLET BY MOUTH TWICE DAILY WITH A MEAL. What changed:  how much to take how to take this when to take this additional instructions   metFORMIN 500 MG tablet Commonly known as: GLUCOPHAGE Take 500 mg by mouth 3 (three) times daily. What changed: Another medication with the same name was changed. Make sure you understand how and when to take each.   metolazone 2.5 MG tablet Commonly known as: ZAROXOLYN Two Times Weekly on Monday and Thursday What changed:  how much to take how to take this when to take this additional instructions   multivitamin with minerals Tabs tablet Take 1 tablet by mouth daily.   Nyamyc powder Generic drug: nystatin Apply topically.   pantoprazole 40 MG tablet Commonly known as: PROTONIX Take 1 tablet (40 mg total) by mouth daily.   polyethylene glycol powder 17  GM/SCOOP powder Commonly known as: MiraLax Take 17 g by mouth 2 (two) times daily as needed for moderate constipation or mild constipation.   potassium chloride 10 MEQ tablet Commonly known as: KLOR-CON Take 10 mEq by mouth 2 (two) times daily.   pravastatin 40 MG tablet Commonly known as: PRAVACHOL TAKE ONE TABLET BY MOUTH DAILY.   senna-docusate 8.6-50 MG tablet Commonly known as: Senokot-S Take 1 tablet by mouth 2 (two) times daily between meals as needed for mild constipation.   sodium chloride 1 g tablet Take 1 g by mouth every morning.   tamsulosin 0.4 MG Caps capsule Commonly known as: FLOMAX TAKE (1) CAPSULE BY MOUTH EVERY DAY. What changed: See the new instructions.   terbinafine 250 MG tablet Commonly known as: LAMISIL TAKE ONE TABLET BY MOUTH ONCE DAILY.   traZODone 50 MG tablet Commonly known as: DESYREL Take 1 tablet (50 mg total) by mouth at bedtime. TAKE 1 TABLET BY MOUTH AT BEDTIME AS NEEDED FOR SLEEP.   triamcinolone cream 0.1 %  Commonly known as: KENALOG Apply 1 Application topically 2 (two) times daily.   Vitamin D3 10 MCG (400 UNIT) Tabs tablet Take 400 Units by mouth daily.               Discharge Care Instructions  (From admission, onward)           Start     Ordered   11/21/22 0000  If the dressing is still on your incision site when you go home, remove it on the third day after your surgery date. Remove dressing if it begins to fall off, or if it is dirty or damaged before the third day.        11/21/22 0941            Follow-up Information     [facility], [facility] Schedule an appointment as soon as possible for a visit in 1 week(s).   Contact information: [place] [address] [phone]                No Known Allergies  Consultations: GI Palliative   Procedures/Studies: DG Chest Port 1 View  Result Date: 11/19/2022 CLINICAL DATA:  Check central line placement  EXAM: PORTABLE CHEST 1 VIEW COMPARISON:  11/18/2022 FINDINGS: Cardiac shadow is stable. Right jugular central line is noted catheter tip at cavoatrial junction. No evidence of pneumothorax is seen. Mild central vascular congestion is noted. No bony abnormality is seen. IMPRESSION: Central line in satisfactory position. No definitive pneumothorax is noted. Mild central vascular congestion. Electronically Signed   By: Inez Catalina M.D.   On: 11/19/2022 01:56   DG Chest Port 1 View  Result Date: 11/18/2022 CLINICAL DATA:  Altered mental status EXAM: PORTABLE CHEST 1 VIEW COMPARISON:  09/21/2022 x-ray and older.  CT angiogram 09/23/2022 FINDINGS: The heart size and mediastinal contours are within normal limits. Both lungs are clear. The visualized skeletal structures are unremarkable. Calcified aorta. Overlapping gown snaps. Lordotic x-ray. IMPRESSION: No active disease. Electronically Signed   By: Jill Side M.D.   On: 11/18/2022 12:23     Discharge Exam: Vitals:   11/21/22 0013 11/21/22 0542  BP: (!) 110/58 131/76  Pulse: 86 94  Resp: 14 17  Temp: (!) 97.5 F (36.4 C) 98 F (36.7 C)  SpO2: 100% 99%   Vitals:   11/20/22 2012 11/20/22 2215 11/21/22 0013 11/21/22 0542  BP: 132/63 132/67 (!) 110/58 131/76  Pulse: 80 76 86 94  Resp: 16 19 14 17  $ Temp: 97.9 F (36.6 C) 98.2 F (36.8 C) (!) 97.5 F (36.4 C) 98 F (36.7 C)  TempSrc: Oral  Oral Axillary  SpO2: 97% 97% 100% 99%  Weight:      Height:        General: Pt is alert, awake, not in acute distress Cardiovascular: RRR, S1/S2 +, no rubs, no gallops Respiratory: CTA bilaterally, no wheezing, no rhonchi Abdominal: Soft, NT, ND, bowel sounds + Extremities: no edema, no cyanosis    The results of significant diagnostics from this hospitalization (including imaging, microbiology, ancillary and laboratory) are listed below for reference.     Microbiology: Recent Results (from the past 240 hour(s))  Culture, blood (routine x 2)      Status: None (Preliminary result)   Collection Time: 11/18/22 11:18 AM   Specimen: BLOOD  Result Value Ref Range Status   Specimen Description BLOOD RIGHT ASSIST CONTROL  Final   Special Requests   Final    BOTTLES DRAWN AEROBIC  AND ANAEROBIC Blood Culture adequate volume   Culture   Final    NO GROWTH 3 DAYS Performed at [facility], [address], [place], [place] [address]    Report Status PENDING  Incomplete  Culture, blood (routine x 2)     Status: None (Preliminary result)   Collection Time: 11/18/22 11:25 AM   Specimen: BLOOD  Result Value Ref Range Status   Specimen Description BLOOD LEFT ASSIST CONTROL  Final   Special Requests   Final    BOTTLES DRAWN AEROBIC AND ANAEROBIC Blood Culture adequate volume   Culture   Final    NO GROWTH 3 DAYS Performed at [facility], [address] [place], [place] [address]    Report Status PENDING  Incomplete  Urine Culture     Status: Abnormal   Collection Time: 11/18/22  1:37 PM   Specimen: Urine, Random  Result Value Ref Range Status   Specimen Description   Final    URINE, RANDOM Performed at [facility], [address] [place], [place] [address]    Special Requests   Final    NONE Performed at [facility], [address] [place], [place] [address]    Culture >=100,000 COLONIES/mL KLEBSIELLA PNEUMONIAE (A)  Final   Report Status 11/20/2022 FINAL  Final   Organism ID, Bacteria KLEBSIELLA PNEUMONIAE (A)  Final      Susceptibility   Klebsiella pneumoniae - MIC*    AMPICILLIN >=32 RESISTANT Resistant     CEFAZOLIN <=4 SENSITIVE Sensitive     CEFEPIME <=0.12 SENSITIVE Sensitive     CEFTRIAXONE <=0.25 SENSITIVE Sensitive     CIPROFLOXACIN <=0.25 SENSITIVE Sensitive     GENTAMICIN <=1 SENSITIVE Sensitive     IMIPENEM <=0.25 SENSITIVE Sensitive     NITROFURANTOIN 64 INTERMEDIATE Intermediate     TRIMETH/SULFA <=20 SENSITIVE Sensitive     AMPICILLIN/SULBACTAM 4 SENSITIVE Sensitive     PIP/TAZO <=4 SENSITIVE Sensitive      * >=100,000 COLONIES/mL KLEBSIELLA PNEUMONIAE  MRSA Next Gen by PCR, Nasal     Status: Abnormal   Collection Time: 11/18/22  7:15 PM   Specimen: Nasal Mucosa; Nasal Swab  Result Value Ref Range Status   MRSA by PCR Next Gen DETECTED (A) NOT DETECTED Final    Comment: RESULT CALLED TO, READ BACK BY AND VERIFIED WITH: FOLEY, B AT 1414 ON 11/19/22 NIELSEN, S (NOTE) The GeneXpert MRSA Assay (FDA approved for NASAL specimens only), is one component of a comprehensive MRSA colonization surveillance program. It is not intended to diagnose MRSA infection nor to guide or monitor treatment for MRSA infections. Test performance is not FDA approved in patients less than 43 years old. Performed at [facility], [address] [place],  [address]      Labs: BNP (last 3 results) No results for input(s): "BNP" in the last 8760 hours. Basic Metabolic Panel: Recent Labs  Lab 11/18/22 1118 11/19/22 0024 11/19/22 0421 11/20/22 0439 11/21/22 0502  NA 133* 135 138 139 143  K 5.8* 5.3* 4.8 3.6 3.5  CL 106 110 114* 114* 115*  CO2 20* 16* 13* 16* 18*  GLUCOSE 105* 108* 136* 175* 153*  BUN 91* 77* 66* 50* 41*  CREATININE 3.07* 2.36* 2.05* 1.58* 1.36*  CALCIUM 8.5* 7.7* 7.5* 7.7* 8.1*  MG 2.5*  --   --  1.7 1.7   Liver Function Tests: Recent Labs  Lab 11/18/22 1118 11/19/22 0024  AST 22 23  ALT 21 19  ALKPHOS 113 92  BILITOT 1.2 1.1  PROT 5.7* 4.9*  ALBUMIN 2.1* 1.7*   No results for input(s): "LIPASE", "AMYLASE" in the last 168 hours. Recent Labs  Lab 11/18/22 1118  AMMONIA 14   CBC: Recent Labs  Lab 11/18/22 1118 11/19/22 0024 11/19/22 0421 11/20/22 0439 11/21/22 0502  WBC 7.9 8.8 10.7* 4.4 5.8  NEUTROABS 6.7  --   --   --   --   HGB 9.5* 10.6* 10.9* 9.8* 10.7*  HCT 30.1* 31.7* 33.2* 29.8* 32.0*  MCV 87.8 86.6 86.7 85.9 84.7  PLT 290 261 270 254 276   Cardiac Enzymes: No results for input(s): "CKTOTAL", "CKMB", "CKMBINDEX", "TROPONINI" in the last 168  hours. BNP: Invalid input(s): "POCBNP" CBG: Recent Labs  Lab 11/20/22 1214 11/20/22 1634 11/20/22 2218 11/21/22 0015 11/21/22 0726  GLUCAP 188* 163* 179* 175* 157*   D-Dimer No results for input(s): "DDIMER" in the last 72 hours. Hgb A1c No results for input(s): "HGBA1C" in the last 72 hours. Lipid Profile No results for input(s): "CHOL", "HDL", "LDLCALC", "TRIG", "CHOLHDL", "LDLDIRECT" in the last 72 hours. Thyroid function studies Recent Labs    11/18/22 1715  TSH 3.824   Anemia work up No results for input(s): "VITAMINB12", "FOLATE", "FERRITIN", "TIBC", "IRON", "RETICCTPCT" in the last 72 hours. Urinalysis    Component Value Date/Time   COLORURINE YELLOW 11/18/2022 1337   APPEARANCEUR HAZY (A) 11/18/2022 1337   APPEARANCEUR Clear 12/21/2021 1054   LABSPEC 1.009 11/18/2022 1337   PHURINE 5.0 11/18/2022 1337   GLUCOSEU NEGATIVE 11/18/2022 1337   HGBUR LARGE (A) 11/18/2022 1337   BILIRUBINUR NEGATIVE 11/18/2022 1337   BILIRUBINUR negative 04/25/2022 0857   BILIRUBINUR Negative 12/21/2021 1054   KETONESUR NEGATIVE 11/18/2022 1337   PROTEINUR NEGATIVE 11/18/2022 1337   UROBILINOGEN 0.2 04/25/2022 0857   UROBILINOGEN 0.2 08/24/2013 1129   NITRITE POSITIVE (A) 11/18/2022 1337   LEUKOCYTESUR LARGE (A) 11/18/2022 1337   Sepsis Labs Recent Labs  Lab 11/19/22 0024 11/19/22 0421 11/20/22 0439 11/21/22 0502  WBC 8.8 10.7* 4.4 5.8   Microbiology Recent Results (from the past 240 hour(s))  Culture, blood (routine x 2)     Status: None (Preliminary result)   Collection Time: 11/18/22 11:18 AM   Specimen: BLOOD  Result Value Ref Range Status   Specimen Description BLOOD RIGHT ASSIST CONTROL  Final   Special Requests   Final    BOTTLES DRAWN AEROBIC AND ANAEROBIC Blood Culture adequate volume   Culture   Final    NO GROWTH 3 DAYS Performed at [facility], [address], [place], [place] [address]    Report Status PENDING  Incomplete  Culture, blood (routine x  2)     Status: None (Preliminary result)   Collection Time: 11/18/22 11:25 AM   Specimen: BLOOD  Result Value Ref Range Status   Specimen Description BLOOD LEFT ASSIST CONTROL  Final   Special Requests   Final    BOTTLES DRAWN AEROBIC AND ANAEROBIC Blood Culture adequate volume   Culture   Final    NO GROWTH 3 DAYS Performed at [facility], [address], [place], [place] [address]    Report Status PENDING  Incomplete  Urine Culture     Status: Abnormal   Collection Time: 11/18/22  1:37 PM   Specimen: Urine, Random  Result Value Ref Range Status   Specimen Description   Final    URINE, RANDOM Performed at [facility], [address] [place], [place] [address]    Special Requests  Final    NONE Performed at [facility], [address] [place], [place] [address]    Culture >=100,000 COLONIES/mL KLEBSIELLA PNEUMONIAE (A)  Final   Report Status 11/20/2022 FINAL  Final   Organism ID, Bacteria KLEBSIELLA PNEUMONIAE (A)  Final      Susceptibility   Klebsiella pneumoniae - MIC*    AMPICILLIN >=32 RESISTANT Resistant     CEFAZOLIN <=4 SENSITIVE Sensitive     CEFEPIME <=0.12 SENSITIVE Sensitive     CEFTRIAXONE <=0.25 SENSITIVE Sensitive     CIPROFLOXACIN <=0.25 SENSITIVE Sensitive     GENTAMICIN <=1 SENSITIVE Sensitive     IMIPENEM <=0.25 SENSITIVE Sensitive     NITROFURANTOIN 64 INTERMEDIATE Intermediate     TRIMETH/SULFA <=20 SENSITIVE Sensitive     AMPICILLIN/SULBACTAM 4 SENSITIVE Sensitive     PIP/TAZO <=4 SENSITIVE Sensitive     * >=100,000 COLONIES/mL KLEBSIELLA PNEUMONIAE  MRSA Next Gen by PCR, Nasal     Status: Abnormal   Collection Time: 11/18/22  7:15 PM   Specimen: Nasal Mucosa; Nasal Swab  Result Value Ref Range Status   MRSA by PCR Next Gen DETECTED (A) NOT DETECTED Final    Comment: RESULT CALLED TO, READ BACK BY AND VERIFIED WITH: FOLEY, B AT 1414 ON 11/19/22 NIELSEN, S (NOTE) The GeneXpert MRSA Assay (FDA approved for NASAL specimens only), is one component  of a comprehensive MRSA colonization surveillance program. It is not intended to diagnose MRSA infection nor to guide or monitor treatment for MRSA infections. Test performance is not FDA approved in patients less than 61 years old. Performed at [facility], [address] [place], [place] [address]      Time coordinating discharge: 35 minutes  SIGNED:   Rodena Goldmann, DO [facility] 11/21/2022, 9:41 AM  If 7PM-7AM, please contact night-coverage [email or web address]

## 2022-11-21 NOTE — Progress Notes (Signed)
Patient discharged back to [place], transported by [employer] [facility]. Discharge packet given to rescue squad.   Samiha Denapoli, Tivis Ringer, RN

## 2022-11-23 LAB — CULTURE, BLOOD (ROUTINE X 2)
Culture: NO GROWTH
Culture: NO GROWTH
Special Requests: ADEQUATE
Special Requests: ADEQUATE

## 2022-11-28 ENCOUNTER — Encounter: Payer: Self-pay | Admitting: Radiology

## 2022-12-05 ENCOUNTER — Institutional Professional Consult (permissible substitution): Payer: Medicare Other | Admitting: Pulmonary Disease

## 2023-01-16 ENCOUNTER — Ambulatory Visit: Payer: Medicare Other | Admitting: Medical

## 2023-04-10 ENCOUNTER — Emergency Department (HOSPITAL_COMMUNITY): Payer: Medicare Other

## 2023-04-10 ENCOUNTER — Inpatient Hospital Stay (HOSPITAL_COMMUNITY)
Admission: EM | Admit: 2023-04-10 | Discharge: 2023-04-15 | DRG: 871 | Disposition: A | Discharge status: Home / Self Care | Payer: Medicare Other | Source: Skilled Nursing Facility | Attending: Internal Medicine | Admitting: Internal Medicine

## 2023-04-10 ENCOUNTER — Other Ambulatory Visit: Payer: Self-pay

## 2023-04-10 ENCOUNTER — Encounter (HOSPITAL_COMMUNITY): Payer: Self-pay | Admitting: Emergency Medicine

## 2023-04-10 DIAGNOSIS — I4719 Other supraventricular tachycardia: Secondary | ICD-10-CM | POA: Diagnosis not present

## 2023-04-10 DIAGNOSIS — E1122 Type 2 diabetes mellitus with diabetic chronic kidney disease: Secondary | ICD-10-CM | POA: Diagnosis present

## 2023-04-10 DIAGNOSIS — Z833 Family history of diabetes mellitus: Secondary | ICD-10-CM

## 2023-04-10 DIAGNOSIS — Z818 Family history of other mental and behavioral disorders: Secondary | ICD-10-CM

## 2023-04-10 DIAGNOSIS — Z83438 Family history of other disorder of lipoprotein metabolism and other lipidemia: Secondary | ICD-10-CM

## 2023-04-10 DIAGNOSIS — K219 Gastro-esophageal reflux disease without esophagitis: Secondary | ICD-10-CM | POA: Diagnosis present

## 2023-04-10 DIAGNOSIS — Z9841 Cataract extraction status, right eye: Secondary | ICD-10-CM

## 2023-04-10 DIAGNOSIS — I5022 Chronic systolic (congestive) heart failure: Secondary | ICD-10-CM | POA: Diagnosis not present

## 2023-04-10 DIAGNOSIS — J449 Chronic obstructive pulmonary disease, unspecified: Secondary | ICD-10-CM | POA: Diagnosis present

## 2023-04-10 DIAGNOSIS — K7581 Nonalcoholic steatohepatitis (NASH): Secondary | ICD-10-CM | POA: Diagnosis present

## 2023-04-10 DIAGNOSIS — I959 Hypotension, unspecified: Secondary | ICD-10-CM | POA: Diagnosis present

## 2023-04-10 DIAGNOSIS — E1142 Type 2 diabetes mellitus with diabetic polyneuropathy: Secondary | ICD-10-CM | POA: Diagnosis present

## 2023-04-10 DIAGNOSIS — Z9842 Cataract extraction status, left eye: Secondary | ICD-10-CM

## 2023-04-10 DIAGNOSIS — E039 Hypothyroidism, unspecified: Secondary | ICD-10-CM | POA: Diagnosis present

## 2023-04-10 DIAGNOSIS — I130 Hypertensive heart and chronic kidney disease with heart failure and stage 1 through stage 4 chronic kidney disease, or unspecified chronic kidney disease: Secondary | ICD-10-CM | POA: Diagnosis present

## 2023-04-10 DIAGNOSIS — E876 Hypokalemia: Secondary | ICD-10-CM | POA: Diagnosis present

## 2023-04-10 DIAGNOSIS — N1831 Chronic kidney disease, stage 3a: Secondary | ICD-10-CM | POA: Diagnosis present

## 2023-04-10 DIAGNOSIS — G9341 Metabolic encephalopathy: Secondary | ICD-10-CM | POA: Diagnosis present

## 2023-04-10 DIAGNOSIS — I471 Supraventricular tachycardia, unspecified: Secondary | ICD-10-CM | POA: Diagnosis not present

## 2023-04-10 DIAGNOSIS — E872 Acidosis, unspecified: Secondary | ICD-10-CM | POA: Diagnosis present

## 2023-04-10 DIAGNOSIS — N3091 Cystitis, unspecified with hematuria: Secondary | ICD-10-CM | POA: Diagnosis not present

## 2023-04-10 DIAGNOSIS — G40909 Epilepsy, unspecified, not intractable, without status epilepticus: Secondary | ICD-10-CM | POA: Diagnosis present

## 2023-04-10 DIAGNOSIS — E785 Hyperlipidemia, unspecified: Secondary | ICD-10-CM | POA: Diagnosis present

## 2023-04-10 DIAGNOSIS — Z79899 Other long term (current) drug therapy: Secondary | ICD-10-CM

## 2023-04-10 DIAGNOSIS — Z794 Long term (current) use of insulin: Secondary | ICD-10-CM

## 2023-04-10 DIAGNOSIS — A419 Sepsis, unspecified organism: Secondary | ICD-10-CM | POA: Diagnosis present

## 2023-04-10 DIAGNOSIS — R6520 Severe sepsis without septic shock: Secondary | ICD-10-CM | POA: Diagnosis present

## 2023-04-10 DIAGNOSIS — F32A Depression, unspecified: Secondary | ICD-10-CM | POA: Diagnosis present

## 2023-04-10 DIAGNOSIS — I5042 Chronic combined systolic (congestive) and diastolic (congestive) heart failure: Secondary | ICD-10-CM | POA: Diagnosis present

## 2023-04-10 DIAGNOSIS — Z7984 Long term (current) use of oral hypoglycemic drugs: Secondary | ICD-10-CM

## 2023-04-10 DIAGNOSIS — Z87891 Personal history of nicotine dependence: Secondary | ICD-10-CM

## 2023-04-10 DIAGNOSIS — Z800 Family history of malignant neoplasm of digestive organs: Secondary | ICD-10-CM

## 2023-04-10 DIAGNOSIS — Z66 Do not resuscitate: Secondary | ICD-10-CM | POA: Diagnosis present

## 2023-04-10 DIAGNOSIS — I872 Venous insufficiency (chronic) (peripheral): Secondary | ICD-10-CM | POA: Diagnosis present

## 2023-04-10 DIAGNOSIS — Z515 Encounter for palliative care: Secondary | ICD-10-CM | POA: Diagnosis not present

## 2023-04-10 DIAGNOSIS — N179 Acute kidney failure, unspecified: Secondary | ICD-10-CM | POA: Insufficient documentation

## 2023-04-10 DIAGNOSIS — E875 Hyperkalemia: Secondary | ICD-10-CM | POA: Diagnosis present

## 2023-04-10 DIAGNOSIS — Z961 Presence of intraocular lens: Secondary | ICD-10-CM | POA: Diagnosis present

## 2023-04-10 DIAGNOSIS — M4313 Spondylolisthesis, cervicothoracic region: Secondary | ICD-10-CM | POA: Diagnosis present

## 2023-04-10 DIAGNOSIS — Z8249 Family history of ischemic heart disease and other diseases of the circulatory system: Secondary | ICD-10-CM

## 2023-04-10 DIAGNOSIS — N390 Urinary tract infection, site not specified: Secondary | ICD-10-CM | POA: Diagnosis present

## 2023-04-10 DIAGNOSIS — E869 Volume depletion, unspecified: Secondary | ICD-10-CM | POA: Diagnosis present

## 2023-04-10 DIAGNOSIS — M4854XA Collapsed vertebra, not elsewhere classified, thoracic region, initial encounter for fracture: Secondary | ICD-10-CM | POA: Diagnosis present

## 2023-04-10 DIAGNOSIS — J9811 Atelectasis: Secondary | ICD-10-CM | POA: Diagnosis present

## 2023-04-10 DIAGNOSIS — Z79890 Hormone replacement therapy (postmenopausal): Secondary | ICD-10-CM

## 2023-04-10 DIAGNOSIS — R4189 Other symptoms and signs involving cognitive functions and awareness: Secondary | ICD-10-CM | POA: Diagnosis present

## 2023-04-10 DIAGNOSIS — M1712 Unilateral primary osteoarthritis, left knee: Secondary | ICD-10-CM | POA: Diagnosis present

## 2023-04-10 LAB — CBC WITH DIFFERENTIAL/PLATELET
Abs Immature Granulocytes: 0.02 10*3/uL (ref 0.00–0.07)
Basophils Absolute: 0 10*3/uL (ref 0.0–0.1)
Basophils Relative: 1 %
Eosinophils Absolute: 0 10*3/uL (ref 0.0–0.5)
Eosinophils Relative: 0 %
HCT: 35.1 % — ABNORMAL LOW (ref 39.0–52.0)
Hemoglobin: 11.7 g/dL — ABNORMAL LOW (ref 13.0–17.0)
Immature Granulocytes: 0 %
Lymphocytes Relative: 15 %
Lymphs Abs: 0.8 10*3/uL (ref 0.7–4.0)
MCH: 32 pg (ref 26.0–34.0)
MCHC: 33.3 g/dL (ref 30.0–36.0)
MCV: 95.9 fL (ref 80.0–100.0)
Monocytes Absolute: 0.5 10*3/uL (ref 0.1–1.0)
Monocytes Relative: 8 %
Neutro Abs: 4.2 10*3/uL (ref 1.7–7.7)
Neutrophils Relative %: 76 %
Platelets: 169 10*3/uL (ref 150–400)
RBC: 3.66 MIL/uL — ABNORMAL LOW (ref 4.22–5.81)
RDW: 13.6 % (ref 11.5–15.5)
WBC: 5.5 10*3/uL (ref 4.0–10.5)
nRBC: 0 % (ref 0.0–0.2)

## 2023-04-10 LAB — URINALYSIS, ROUTINE W REFLEX MICROSCOPIC
Bilirubin Urine: NEGATIVE
Glucose, UA: NEGATIVE mg/dL
Ketones, ur: NEGATIVE mg/dL
Nitrite: NEGATIVE
Protein, ur: 30 mg/dL — AB
Specific Gravity, Urine: 1.011 (ref 1.005–1.030)
WBC, UA: >50 WBC/hpf (ref 0–5)
pH: 5 (ref 5.0–8.0)

## 2023-04-10 LAB — COMPREHENSIVE METABOLIC PANEL
ALT: 31 U/L (ref 0–44)
AST: 17 U/L (ref 15–41)
Albumin: 3.4 g/dL — ABNORMAL LOW (ref 3.5–5.0)
Alkaline Phosphatase: 117 U/L (ref 38–126)
Anion gap: 7 (ref 5–15)
BUN: 74 mg/dL — ABNORMAL HIGH (ref 8–23)
CO2: 15 mmol/L — ABNORMAL LOW (ref 22–32)
Calcium: 11.8 mg/dL — ABNORMAL HIGH (ref 8.9–10.3)
Chloride: 121 mmol/L — ABNORMAL HIGH (ref 98–111)
Creatinine, Ser: 3.61 mg/dL — ABNORMAL HIGH (ref 0.61–1.24)
GFR, Estimated: 17 mL/min — ABNORMAL LOW (ref >60)
Glucose, Bld: 143 mg/dL — ABNORMAL HIGH (ref 70–99)
Potassium: 6.3 mmol/L (ref 3.5–5.1)
Sodium: 143 mmol/L (ref 135–145)
Total Bilirubin: 0.9 mg/dL (ref 0.3–1.2)
Total Protein: 6.3 g/dL — ABNORMAL LOW (ref 6.5–8.1)

## 2023-04-10 LAB — PROTIME-INR
INR: 1.2 (ref 0.8–1.2)
Prothrombin Time: 15.7 seconds — ABNORMAL HIGH (ref 11.4–15.2)

## 2023-04-10 LAB — APTT: aPTT: 24 seconds (ref 24–36)

## 2023-04-10 LAB — LACTIC ACID, PLASMA
Lactic Acid, Venous: 1.1 mmol/L (ref 0.5–1.9)
Lactic Acid, Venous: 1.6 mmol/L (ref 0.5–1.9)

## 2023-04-10 LAB — PROCALCITONIN: Procalcitonin: 0.42 ng/mL

## 2023-04-10 LAB — TSH: TSH: 0.272 u[IU]/mL — ABNORMAL LOW (ref 0.350–4.500)

## 2023-04-10 LAB — VITAMIN D 25 HYDROXY (VIT D DEFICIENCY, FRACTURES): Vit D, 25-Hydroxy: 34.99 ng/mL (ref 30–100)

## 2023-04-10 MED ORDER — VANCOMYCIN HCL 1500 MG/300ML IV SOLN
1500.0000 mg | Freq: Once | INTRAVENOUS | Status: AC
  Administered 2023-04-10: 1500 mg via INTRAVENOUS
  Filled 2023-04-10: qty 300

## 2023-04-10 MED ORDER — LACTATED RINGERS IV BOLUS
1000.0000 mL | Freq: Once | INTRAVENOUS | Status: AC
  Administered 2023-04-10: 1000 mL via INTRAVENOUS

## 2023-04-10 MED ORDER — ONDANSETRON HCL 4 MG/2ML IJ SOLN
4.0000 mg | Freq: Four times a day (QID) | INTRAMUSCULAR | Status: DC | PRN
  Administered 2023-04-11: 4 mg via INTRAVENOUS
  Filled 2023-04-10: qty 2

## 2023-04-10 MED ORDER — LEVETIRACETAM IN NACL 500 MG/100ML IV SOLN
500.0000 mg | Freq: Two times a day (BID) | INTRAVENOUS | Status: DC
  Administered 2023-04-10 – 2023-04-15 (×10): 500 mg via INTRAVENOUS
  Filled 2023-04-10 (×10): qty 100

## 2023-04-10 MED ORDER — SERTRALINE HCL 50 MG PO TABS
50.0000 mg | ORAL_TABLET | Freq: Every day | ORAL | Status: DC
  Administered 2023-04-11 – 2023-04-15 (×5): 50 mg via ORAL
  Filled 2023-04-10 (×5): qty 1

## 2023-04-10 MED ORDER — INSULIN ASPART 100 UNIT/ML IV SOLN
5.0000 [IU] | Freq: Once | INTRAVENOUS | Status: AC
  Administered 2023-04-10: 5 [IU] via INTRAVENOUS

## 2023-04-10 MED ORDER — SODIUM ZIRCONIUM CYCLOSILICATE 10 G PO PACK: 10.0000 g | PACK | Freq: Once | ORAL | Status: DC

## 2023-04-10 MED ORDER — ACETAMINOPHEN 650 MG RE SUPP: 650.0000 mg | Freq: Four times a day (QID) | RECTAL | Status: DC | PRN

## 2023-04-10 MED ORDER — DEXTROSE 50 % IV SOLN
1.0000 | Freq: Once | INTRAVENOUS | Status: AC
  Administered 2023-04-10: 50 mL via INTRAVENOUS
  Filled 2023-04-10: qty 50

## 2023-04-10 MED ORDER — CHLORHEXIDINE GLUCONATE CLOTH 2 % EX PADS
6.0000 | MEDICATED_PAD | Freq: Every day | CUTANEOUS | Status: DC
  Administered 2023-04-11 – 2023-04-15 (×5): 6 via TOPICAL

## 2023-04-10 MED ORDER — SODIUM CHLORIDE 0.9 % IV SOLN
2.0000 g | INTRAVENOUS | Status: DC
  Administered 2023-04-11 – 2023-04-13 (×3): 2 g via INTRAVENOUS
  Filled 2023-04-10 (×3): qty 12.5

## 2023-04-10 MED ORDER — LACTULOSE 10 GM/15ML PO SOLN
10.0000 g | Freq: Three times a day (TID) | ORAL | Status: DC
  Administered 2023-04-10 – 2023-04-15 (×10): 10 g via ORAL
  Filled 2023-04-10 (×10): qty 30

## 2023-04-10 MED ORDER — ACETAMINOPHEN 325 MG PO TABS
650.0000 mg | ORAL_TABLET | Freq: Four times a day (QID) | ORAL | Status: DC | PRN
  Administered 2023-04-13: 650 mg via ORAL
  Filled 2023-04-10: qty 2

## 2023-04-10 MED ORDER — LEVOTHYROXINE SODIUM 100 MCG PO TABS
100.0000 ug | ORAL_TABLET | Freq: Every day | ORAL | Status: DC
  Administered 2023-04-11 – 2023-04-15 (×5): 100 ug via ORAL
  Filled 2023-04-10 (×5): qty 1

## 2023-04-10 MED ORDER — SODIUM CHLORIDE 0.9 % IV SOLN
2.0000 g | Freq: Once | INTRAVENOUS | Status: AC
  Administered 2023-04-10: 2 g via INTRAVENOUS
  Filled 2023-04-10: qty 12.5

## 2023-04-10 MED ORDER — SODIUM CHLORIDE 0.9 % IV SOLN: INTRAVENOUS | Status: DC

## 2023-04-10 MED ORDER — SODIUM ZIRCONIUM CYCLOSILICATE 10 G PO PACK
10.0000 g | PACK | Freq: Once | ORAL | Status: AC
  Administered 2023-04-10: 10 g via ORAL
  Filled 2023-04-10: qty 1

## 2023-04-10 MED ORDER — HEPARIN SODIUM (PORCINE) 5000 UNIT/ML IJ SOLN
5000.0000 [IU] | Freq: Three times a day (TID) | INTRAMUSCULAR | Status: DC
  Administered 2023-04-10 – 2023-04-15 (×14): 5000 [IU] via SUBCUTANEOUS
  Filled 2023-04-10 (×14): qty 1

## 2023-04-10 MED ORDER — ONDANSETRON HCL 4 MG PO TABS: 4.0000 mg | ORAL_TABLET | Freq: Four times a day (QID) | ORAL | Status: DC | PRN

## 2023-04-10 MED ORDER — VANCOMYCIN HCL IN DEXTROSE 1-5 GM/200ML-% IV SOLN
1000.0000 mg | INTRAVENOUS | Status: AC
  Administered 2023-04-12: 1000 mg via INTRAVENOUS
  Filled 2023-04-10: qty 200

## 2023-04-10 MED ORDER — TAMSULOSIN HCL 0.4 MG PO CAPS
0.4000 mg | ORAL_CAPSULE | Freq: Every day | ORAL | Status: DC
  Administered 2023-04-11 – 2023-04-15 (×5): 0.4 mg via ORAL
  Filled 2023-04-10 (×5): qty 1

## 2023-04-10 NOTE — ED Notes (Signed)
Patient turned onto right side.

## 2023-04-10 NOTE — Hospital Course (Addendum)
77 year old male with a history of combined CHF (prior EF 15-20%), NASH, diabetes mellitus type 2, hypertension, hypothyroidism, venous stasis dermatitis presenting from [facility] with altered mental status.  Apparently, the patient has been getting more confused since 04/08/23.  A UA was obtained at that time and suggested possible UTI.  The patient was started on cephalexin at his facility.  Unfortunately, the patient's confusion continue to worsen.  At baseline, the patient gets around on a wheelchair up, but is able to make transfers.  However in the last 24 hours, he has not been able to get out of bed, and he has been less responsive.  As result, the patient was brought to the emergency department for further evaluation and treatment.  At the time of my evaluation, the patient is not able to provide a significant history.  He is able to answer some simple yes/no questions.  History is obtained from review of medical record and speaking with EDP.  At baseline, the patient is pleasantly confused but he is able to carry on a conversation.  At the time of my evaluation, the patient denies chest pain, shortness breath, abdominal pain, headache.  Remainder review of systems is unobtainable secondary the patient 's encephalopathy. In the ED, the patient had low-grade temperature 99.5 F.  He was tachycardic up to the 130s.  The patient was initially hypotensive with a blood pressure of 80/58.  The patient was fluid resuscitated and given 2 L of LR.  He was started on cefepime and vancomycin.  CT of the chest, abdomen, and pelvis was negative for any infiltrates or edema.  There was circumferential mural thickening of his bladder.  A Foley catheter was pleasant.  There was no abnormal lytic or blastic lesions of his thoracic spine.  There was LLL subsegmental atelectasis.  There is a nodular hepatic contour.  WBC 5.5, hemoglobin 1.7, platelets 269,000.  LFTs unremarkable.  Albumin 3.4, calcium 11.8 with the  corrected calcium of 12.2.  Sodium 143, potassium 6.3, bicarbonate 15, serum creatinine 3.61.  The patient was given temporizing measures for his hyperkalemia.  Lactic acid 1.1>> 1.6.  UA showed>50 WBC.  EKG shows sinus tachycardia with nonspecific T wave changes. The patient improved clinically with resolution of his sepsis physiology.  The patient remained intermittently confused.  However his mental status returned to essentially his baseline.  This was confirmed with the patient's niece.  At baseline, the patient is able to make transfers to wheelchair.  As such, he will return to [facility].  The patient's blood cultures remain negative.  Urine cultures were polymicrobial.  Nevertheless, the patient was continued on ceftriaxone with ceftriaxone improvement.  He was discharged with 2 additional days of cefdinir.

## 2023-04-10 NOTE — H&P (Addendum)
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  Apparently, the patient has been getting more confused since 7024.  A UA was obtained at that time and suggested possible UTI.  The patient was started on cephalexin at his facility.  Unfortunately, the patient's confusion continue to worsen.  At baseline, the patient gets around on a wheelchair up, but is able to make transfers.  However in the last 24 hours, he has not been able to get out of bed, and he has been less responsive.  As result, the patient was brought to the emergency department for further evaluation and treatment.  At the time of my evaluation, the patient is not able to provide a significant history.  He is able to answer some simple yes/no questions.  History is obtained from review of medical record and speaking with EDP.  At baseline, the patient is pleasantly confused but he is able to carry on a conversation.  At the time of my evaluation, the patient denies chest pain, shortness breath, abdominal pain, headache.  Remainder review of systems is unobtainable secondary the patient 's encephalopathy. In the ED, the patient had low-grade temperature 99.5 F.  He was tachycardic up to the 130s.  The patient was initially hypotensive with a blood pressure of 80/58.  The patient was fluid resuscitated and given 2 L of LR.  He was started on cefepime and vancomycin.  CT of the chest, abdomen, and pelvis was negative for any infiltrates or  edema.  There was circumferential mural thickening of his bladder.  A Foley catheter was pleasant.  There was no abnormal lytic or blastic lesions of his thoracic spine.  There was LLL subsegmental atelectasis.  There is a nodular hepatic contour.  WBC 5.5, hemoglobin 1.7, platelets 269,000.  LFTs unremarkable.  Albumin 3.4, calcium 11.8 with the corrected calcium of 12.2.  Sodium 143, potassium 6.3, bicarbonate 15, serum creatinine 3.61.  The patient was given temporizing measures for his hyperkalemia.  Lactic acid 1.1>> 1.6.  UA showed>50 WBC.  EKG shows sinus tachycardia with nonspecific T wave changes.  Review of Systems: As mentioned in the history of present illness. All other systems reviewed and are negative. Past Medical History:  Diagnosis Date   Arthritis    Congestive heart disease (HCC) 11/2015   COPD (chronic obstructive pulmonary disease) (HCC)    DDD (degenerative disc disease), lumbosacral    Demand ischemia 09/23/2019   Depression    Diabetic neuropathy (HCC)    Diastolic dysfunction    Essential hypertension    GERD (gastroesophageal reflux disease)    Hyperlipidemia    Iron deficiency anemia    Left knee DJD    Lumbar spinal stenosis    Lymphedema 11/2015   BOTH LEGS   NASH (nonalcoholic steatohepatitis)    Peripheral edema    Peripheral edema 10/05/2015   Type 2 diabetes mellitus (HCC)    Past Surgical History:  Procedure Laterality Date   BIOPSY N/A 07/21/2014   Procedure: BIOPSY;  Surgeon: Molly Maduro  Sonnie Alamo, MD;  Location: [facility];  Service: Endoscopy;  Laterality: N/A;   CATARACT EXTRACTION W/PHACO Right 07/08/2016   Procedure: CATARACT EXTRACTION PHACO AND INTRAOCULAR LENS PLACEMENT RIGHT EYE;  Surgeon: Gemma Payor, MD;  Location: [facility];  Service: Ophthalmology;  Laterality: Right;  CDE: 9.01   CATARACT EXTRACTION W/PHACO Left 07/22/2016   Procedure: CATARACT EXTRACTION PHACO AND INTRAOCULAR LENS PLACEMENT LEFT EYE CDE=10.69;  Surgeon: Gemma Payor, MD;  Location: [facility];  Service: Ophthalmology;  Laterality: Left;  left -    COLONOSCOPY  2011   [facility]: normal colon, normal distal ileum   COLONOSCOPY WITH PROPOFOL N/A 07/21/2014   Procedure: ATTEMPTED COLONOSCOPY WITH PROPOFOL-HAD TO STOP DUE TO BRADYCARDIA;  Surgeon: Corbin Ade, MD;  Location: [facility];  Service: Endoscopy;  Laterality: N/A;   EGD with enteroscopy  2011   [facility]: normal esophagus and stomach. Normal duodenum, jejunum. No evidence of AVMs.    ESOPHAGOGASTRODUODENOSCOPY (EGD) WITH PROPOFOL N/A 07/21/2014   Procedure: ESOPHAGOGASTRODUODENOSCOPY (EGD) WITH PROPOFOL;  Surgeon: Corbin Ade, MD;  Location: [facility];  Service: Endoscopy;  Laterality: N/A;   HERNIA REPAIR     KNEE ARTHROSCOPY WITH MEDIAL MENISECTOMY Left 11/06/2012   Procedure: KNEE ARTHROSCOPY WITH MEDIAL MENISECTOMY;  Surgeon: Vickki Hearing, MD;  Location: [facility];  Service: Orthopedics;  Laterality: Left;   Lipoma removal     Stomach   LUMBAR LAMINECTOMY/DECOMPRESSION MICRODISCECTOMY Left 08/30/2013   Procedure: LUMBAR LAMINECTOMY/DECOMPRESSION MICRODISCECTOMY LEFT  LUMBAR TWO THREE;  Surgeon: Clydene Fake, MD;  Location: [facility];  Service: Neurosurgery;  Laterality: Left;   RIGHT/LEFT HEART CATH AND CORONARY ANGIOGRAPHY N/A 09/22/2019   Procedure: RIGHT/LEFT HEART CATH AND CORONARY ANGIOGRAPHY;  Surgeon: [place], Peter M, MD;  Location: [facility] INVASIVE CV LAB;  Service: Cardiovascular;  Laterality: N/A;   SHOULDER SURGERY     Rght-rotator cuff   TOOTH EXTRACTION     Social History:  reports that he quit smoking about 23 years ago. His smoking use included cigarettes. He started smoking about 67 years ago. He has a 111 pack-year smoking history. He has never used smokeless tobacco. He reports that he does not drink alcohol and does not use drugs.  No Known Allergies  Family History  Problem Relation Age of Onset   Heart disease Mother    Hyperlipidemia Mother    Hypertension Mother    Depression Mother     Diabetes Mother    Rectal cancer Mother    Cancer Mother    Heart disease Father    Hypertension Father    Hyperlipidemia Father    Diabetes Father    Cancer Father    Heart disease Sister    Hyperlipidemia Sister    Hypertension Sister    Diabetes Sister    Diabetes Brother    Heart disease Sister    Hyperlipidemia Sister    Hypertension Sister    Heart disease Sister    Hyperlipidemia Sister    Hypertension Sister    Diabetes Sister    Diabetes Brother     Prior to Admission medications   Medication Sig Start Date End Date Taking? Authorizing Provider  acetaminophen (TYLENOL) 325 MG tablet Take 2 tablets (650 mg total) by mouth every 6 (six) hours as needed. 10/01/22 10/01/23 Yes Almon Hercules, MD  allopurinol (ZYLOPRIM) 100 MG tablet Take 100 mg by mouth daily.   Yes [provider]  bumetanide (BUMEX) 1 MG tablet Take 1 mg by mouth  See admin instructions. Take 3 tablets (3mg ) by mouth daily 11/05/22  Yes [provider]  chlorhexidine (PERIDEX) 0.12 % solution Use as directed 15 mLs in the mouth or throat 2 (two) times daily. 04/03/23  Yes [provider]  gabapentin (NEURONTIN) 400 MG capsule Take 1 capsule (400 mg total) by mouth 3 (three) times daily. 03/04/18  Yes , Velna Hatchet, MD  lactulose [facility]) 10 GM/15ML solution Take 10 g by mouth 3 (three) times daily. 02/11/23  Yes [provider]  levETIRAcetam (KEPPRA) 1000 MG tablet Take 1 tablet (1,000 mg total) by mouth 2 (two) times daily. 10/01/22  Yes Almon Hercules, MD  levothyroxine (SYNTHROID) 100 MCG tablet Take 100 mcg by mouth daily before breakfast.   Yes [provider]  loperamide (IMODIUM A-D) 2 MG tablet Take 2 mg by mouth daily as needed for diarrhea or loose stools.   Yes [provider]  metFORMIN (GLUCOPHAGE) 1000 MG tablet TAKE ONE TABLET BY MOUTH TWICE DAILY WITH A MEAL. Patient taking differently: Take 1,000 mg by mouth 2 (two) times daily with a meal.  09/24/19  Yes Marjie Skiff E, PA-C  metolazone (ZAROXOLYN) 2.5 MG tablet Two Times Weekly on Monday and Thursday Patient taking differently: Take 2.5 mg by mouth 2 (two) times a week. Monday and Thursday 02/08/20  Yes BranchDorothe Pea, MD  Morphine Sulfate (MORPHINE CONCENTRATE) 10 mg / 0.5 ml concentrated solution Take 5 mg by mouth every 2 (two) hours as needed for moderate pain. 04/08/23  Yes [provider]  pantoprazole (PROTONIX) 40 MG tablet Take 1 tablet (40 mg total) by mouth daily. 02/08/20  Yes Branch, Dorothe Pea, MD  PATADAY 0.1 % ophthalmic solution Place 1 drop into both eyes 2 (two) times daily. 01/31/23  Yes [provider]  polyethylene glycol powder (MIRALAX) 17 GM/SCOOP powder Take 17 g by mouth 2 (two) times daily as needed for moderate constipation or mild constipation. 10/01/22  Yes Almon Hercules, MD  potassium chloride (KLOR-CON) 10 MEQ tablet Take 10 mEq by mouth 2 (two) times daily.   Yes [provider]  sacubitril-valsartan (ENTRESTO) 49-51 MG Take 1 tablet by mouth 2 (two) times daily. 02/08/20  Yes BranchDorothe Pea, MD  sertraline (ZOLOFT) 50 MG tablet Take 50 mg by mouth daily.   Yes [provider]  sodium chloride 1 g tablet Take 1 g by mouth every morning. 10/16/22  Yes [provider]  sulfamethoxazole-trimethoprim (BACTRIM DS) 800-160 MG tablet Take 1 tablet by mouth 2 (two) times daily.   Yes [provider]  tamsulosin (FLOMAX) 0.4 MG CAPS capsule TAKE (1) CAPSULE BY MOUTH EVERY DAY. Patient taking differently: Take 0.4 mg by mouth daily. 10/10/21  Yes Anabel Halon, MD  traZODone (DESYREL) 50 MG tablet Take 1 tablet (50 mg total) by mouth at bedtime. TAKE 1 TABLET BY MOUTH AT BEDTIME AS NEEDED FOR SLEEP. Patient taking differently: Take 100 mg by mouth at bedtime. 10/01/22  Yes Almon Hercules, MD  carvedilol (COREG) 12.5 MG tablet Take 12.5 mg by mouth 2 (two) times daily. Patient not taking: Reported on  04/10/2023 11/12/22   [provider]  carvedilol (COREG) 25 MG tablet Take 1 tablet (25 mg total) by mouth 2 (two) times daily with a meal. Patient not taking: Reported on 11/18/2022 10/01/22 12/30/22  Almon Hercules, MD  insulin glargine-yfgn (SEMGLEE) 100 UNIT/ML Pen  06/18/22   [provider]  LANTUS SOLOSTAR 100 UNIT/ML Solostar  Pen Inject 10 Units into the skin at bedtime. Patient not taking: Reported on 04/10/2023 10/29/22   [provider]  pravastatin (PRAVACHOL) 40 MG tablet TAKE ONE TABLET BY MOUTH DAILY. Patient not taking: Reported on 04/10/2023 05/21/16   Salley Scarlet, MD    Physical Exam: Vitals:   04/10/23 1300 04/10/23 1330 04/10/23 1400 04/10/23 1402  BP: (!) 81/59 (!) 80/58 106/70   Pulse: (!) 109 (!) 107 (!) 131   Resp: 13 13 13    Temp:    98.4 F (36.9 C)  TempSrc:    Rectal  SpO2: 92% 94% 97%   Weight:      Height:       GENERAL:  A&O x 1, NAD, well developed, cooperative, follows commands HEENT: [place]/AT, No thrush, No icterus, No oral ulcers Neck:  No neck mass, No meningismus, soft, supple CV: RRR, no S3, no S4, no rub, no JVD Lungs: Bibasilar rales.  No wheezing.  Good air movement Abd: soft/NT +BS, nondistended Ext: No edema, no lymphangitis, no cyanosis, no rashes Neuro:  CN II-XII intact, strength 4/5 in RUE, RLE, strength 4/5 LUE, LLE; sensation intact bilateral; no dysmetria; babinski equivocal  Data Reviewed: Data reviewed above in the history  Assessment and Plan: Sepsis -Present on admission -Secondary to urine source -UA>50 WBC -Chest x-ray negative for infiltrates -CT chest, A/P--negative for any acute findings except increased anterior wedging of T1 with new grade 1 anterolisthesis C7-T1 -Follow blood cultures and urine cultures -Continue empiric vanc and  cefepime  UTI -continue empiric vanc and cefepime pending culture data -Foley catheter was placed in the ED with 350 cc out  Acute on chronic renal failure-CKD  3a -Baseline creatinine 1.0-1.3 -Presented with serum creatinine 3.61 -Secondary to volume depletion and hemodynamic changes -Continue IV fluids and trend BMP -Temporarily holding Bumex  Acute metabolic encephalopathy -Secondary to infectious process and hypercalcemia and AKI -Further workup if no improvement with treatment of infection  Hypercalcemia -check intact PTH -25 vitamin D -PTh-rp -continue IVF  Hyperkalemia -anticipate improvement with IVF -temporizing measures given -lokelma  Chronic HFimprEF -09/20/2019 echo EF 15-20%, AK of the distal one third LV -09/23/2022 echo EF 50 to 55%, grade 2 DD, trivial MR -Holding Entresto secondary to AKI -Temporarily holding Bumex -Holding carvedilol secondary to hypotension  Hypothyroidism -Continue Synthroid  Diabetes mellitus type 2 -Holding metformin -NovoLog sliding scale -06/20/2022 hemoglobin A1c 6.3 -Update A1C  Hyperlipidemia -Continue statin  Diabetic polyneuropathy -Holding gabapentin in the setting of myoclonus with AKI      Advance Care Planning: DNR--pt has goldenrod form confirming DNR  Consults: none  Family Communication: none present  Severity of Illness: The appropriate patient status for this patient is INPATIENT. Inpatient status is judged to be reasonable and necessary in order to provide the required intensity of service to ensure the patient's safety. The patient's presenting symptoms, physical exam findings, and initial radiographic and laboratory data in the context of their chronic comorbidities is felt to place them at high risk for further clinical deterioration. Furthermore, it is not anticipated that the patient will be medically stable for discharge from the hospital within 2 midnights of admission.   * I certify that at the point of admission it is my clinical judgment that the patient will require inpatient hospital care spanning beyond 2 midnights from the point of admission due to  high intensity of service, high risk for further deterioration and high frequency of surveillance required.*  Author: Catarina Hartshorn, MD 04/10/2023  2:54 PM  For on call review [email or web address].

## 2023-04-10 NOTE — ED Triage Notes (Signed)
Pt to ER via EMS from [facility] of [address].  Pt has known UTI, per facility pt "will not" take his abx.  Pt is under hospice care for heart failure.  Per facility pt is normally verbal, per EMS, pt was nonverbal during their trip her and pt is not answering questions from ER staff.

## 2023-04-10 NOTE — Progress Notes (Signed)
Pharmacy Antibiotic Note  Michael [name] is a 77 y.o. male admitted on 04/10/2023 with sepsis.  Pharmacy has been consulted for Vancomycin dosing.  Plan: Vancomycin 1500 mg IV loading dose then 1000 mg IV Q 48 hrs. Goal AUC 400-550. Expected AUC: 494 SCr used: 3.61 F/U cxs and clinical progress Monitor V/S, labs and levels as indicated   Height: 5\' 7"  (170.2 cm) Weight: 80 kg (176 lb 5.9 oz) IBW/kg (Calculated) : 66.1  Temp (24hrs), Avg:99 F (37.2 C), Min:98.4 F (36.9 C), Max:99.5 F (37.5 C)  Recent Labs  Lab 04/10/23 1017 04/10/23 1208  WBC 5.5  --   CREATININE 3.61*  --   LATICACIDVEN 1.1 1.6    Estimated Creatinine Clearance: 17.9 mL/min (A) (by C-G formula based on SCr of 3.61 mg/dL (H)).    No Known Allergies  Antimicrobials this admission: Vancomycin 7/11 >> Cefepime 7/11  Microbiology results: 7/11 BCx: pending 7/1 UCx: pending   MRSA PCR:   Thank you for allowing pharmacy to be a part of this patient's care.  Elder Cyphers, BS Pharm D, BCPS Clinical Pharmacist 04/10/2023 2:14 PM

## 2023-04-10 NOTE — Progress Notes (Signed)
Pharmacy Antibiotic Note  Michael [name] is a 77 y.o. male admitted on 04/10/2023 with sepsis.  Pharmacy has been consulted for Vancomycin dosing.  Plan: Vancomycin 1500 mg IV loading dose then 1000 mg IV Q 48 hrs. Goal AUC 400-550. Expected AUC: 494 SCr used: 3.61 Cefepime 2g IV q24 F/U cxs and clinical progress Monitor V/S, labs and levels as indicated   Height: 5\' 7"  (170.2 cm) Weight: 80 kg (176 lb 5.9 oz) IBW/kg (Calculated) : 66.1  Temp (24hrs), Avg:99 F (37.2 C), Min:98.4 F (36.9 C), Max:99.5 F (37.5 C)  Recent Labs  Lab 04/10/23 1017 04/10/23 1208  WBC 5.5  --   CREATININE 3.61*  --   LATICACIDVEN 1.1 1.6    Estimated Creatinine Clearance: 17.9 mL/min (A) (by C-G formula based on SCr of 3.61 mg/dL (H)).    No Known Allergies  Antimicrobials this admission: Vancomycin 7/11 >> Cefepime 7/11>>   Microbiology results: 7/11 BCx: pending 7/1 UCx: pending   MRSA PCR:   Thank you for allowing pharmacy to be a part of this patient's care.  Elder Cyphers, BS Pharm D, BCPS Clinical Pharmacist 04/10/2023 3:25 PM

## 2023-04-10 NOTE — ED Provider Notes (Signed)
[name] EMERGENCY DEPARTMENT AT [facility] Provider Note   CSN: [id number] Arrival date & time: 04/10/23  0865     History  Chief Complaint  Patient presents with   Altered Mental Status   Urinary Tract Infection    Michael [name] is a 77 y.o. male.  With a significant history including but not limited to COPD, type 2 diabetes, hypertension, CHF with an EF of 50 to 55% on echo from 09/23/2022, iron deficiency anemia who presents to the ED via EMS for evaluation of altered mental status.  Patient is unable to contribute to his own history.  EMS states patient was diagnosed with a UTI but has not been taking his antibiotics.  Patient is from [facility] of [address].   Patient presents with DNR paperwork.  He is reportedly also on hospice care for his CHF.   Altered Mental Status Urinary Tract Infection      Home Medications Prior to Admission medications   Medication Sig Start Date End Date Taking? Authorizing Provider  acetaminophen (TYLENOL) 325 MG tablet Take 2 tablets (650 mg total) by mouth every 6 (six) hours as needed. 10/01/22 10/01/23 Yes Almon Hercules, MD  allopurinol (ZYLOPRIM) 100 MG tablet Take 100 mg by mouth daily.   Yes [provider]  bumetanide (BUMEX) 1 MG tablet Take 1 mg by mouth See admin instructions. Take 3 tablets (3mg ) by mouth daily 11/05/22  Yes [provider]  chlorhexidine (PERIDEX) 0.12 % solution Use as directed 15 mLs in the mouth or throat 2 (two) times daily. 04/03/23  Yes [provider]  gabapentin (NEURONTIN) 400 MG capsule Take 1 capsule (400 mg total) by mouth 3 (three) times daily. 03/04/18  Yes [place], Velna Hatchet, MD  lactulose [facility]) 10 GM/15ML solution Take 10 g by mouth 3 (three) times daily. 02/11/23  Yes [provider]  levETIRAcetam (KEPPRA) 1000 MG tablet Take 1 tablet (1,000 mg total) by mouth 2 (two) times daily. 10/01/22  Yes Almon Hercules, MD  levothyroxine (SYNTHROID) 100 MCG tablet  Take 100 mcg by mouth daily before breakfast.   Yes [provider]  loperamide (IMODIUM A-D) 2 MG tablet Take 2 mg by mouth daily as needed for diarrhea or loose stools.   Yes [provider]  metFORMIN (GLUCOPHAGE) 1000 MG tablet TAKE ONE TABLET BY MOUTH TWICE DAILY WITH A MEAL. Patient taking differently: Take 1,000 mg by mouth 2 (two) times daily with a meal. 09/24/19  Yes Marjie Skiff E, PA-C  metolazone (ZAROXOLYN) 2.5 MG tablet Two Times Weekly on Monday and Thursday Patient taking differently: Take 2.5 mg by mouth 2 (two) times a week. Monday and Thursday 02/08/20  Yes BranchDorothe Pea, MD  Morphine Sulfate (MORPHINE CONCENTRATE) 10 mg / 0.5 ml concentrated solution Take 5 mg by mouth every 2 (two) hours as needed for moderate pain. 04/08/23  Yes [provider]  pantoprazole (PROTONIX) 40 MG tablet Take 1 tablet (40 mg total) by mouth daily. 02/08/20  Yes Branch, Dorothe Pea, MD  PATADAY 0.1 % ophthalmic solution Place 1 drop into both eyes 2 (two) times daily. 01/31/23  Yes [provider]  polyethylene glycol powder (MIRALAX) 17 GM/SCOOP powder Take 17 g by mouth 2 (two) times daily as needed for moderate constipation or mild constipation. 10/01/22  Yes Almon Hercules, MD  potassium chloride (KLOR-CON) 10 MEQ tablet Take 10 mEq by mouth 2 (two) times daily.   Yes [provider]  sacubitril-valsartan (ENTRESTO) 49-51 MG Take 1 tablet by mouth 2 (two) times daily. 02/08/20  Yes BranchDorothe Pea, MD  sertraline (ZOLOFT) 50 MG tablet Take 50 mg by mouth daily.   Yes [provider]  sodium chloride 1 g tablet Take 1 g by mouth every morning. 10/16/22  Yes [provider]  sulfamethoxazole-trimethoprim (BACTRIM DS) 800-160 MG tablet Take 1 tablet by mouth 2 (two) times daily.   Yes [provider]  tamsulosin (FLOMAX) 0.4 MG CAPS capsule TAKE (1) CAPSULE BY MOUTH EVERY DAY. Patient taking differently: Take 0.4 mg by mouth  daily. 10/10/21  Yes Anabel Halon, MD  traZODone (DESYREL) 50 MG tablet Take 1 tablet (50 mg total) by mouth at bedtime. TAKE 1 TABLET BY MOUTH AT BEDTIME AS NEEDED FOR SLEEP. Patient taking differently: Take 100 mg by mouth at bedtime. 10/01/22  Yes Almon Hercules, MD  carvedilol (COREG) 12.5 MG tablet Take 12.5 mg by mouth 2 (two) times daily. Patient not taking: Reported on 04/10/2023 11/12/22   [provider]  carvedilol (COREG) 25 MG tablet Take 1 tablet (25 mg total) by mouth 2 (two) times daily with a meal. Patient not taking: Reported on 11/18/2022 10/01/22 12/30/22  Almon Hercules, MD  insulin glargine-yfgn (SEMGLEE) 100 UNIT/ML Pen  06/18/22   [provider]  LANTUS SOLOSTAR 100 UNIT/ML Solostar Pen Inject 10 Units into the skin at bedtime. Patient not taking: Reported on 04/10/2023 10/29/22   [provider]  pravastatin (PRAVACHOL) 40 MG tablet TAKE ONE TABLET BY MOUTH DAILY. Patient not taking: Reported on 04/10/2023 05/21/16   Salley Scarlet, MD      Allergies    Patient has no known allergies.    Review of Systems   Review of Systems  Reason unable to perform ROS: non verbal.    Physical Exam Updated Vital Signs BP 106/70   Pulse (!) 131   Temp 98.4 F (36.9 C) (Rectal)   Resp 13   Ht 5\' 7"  (1.702 m)   Wt 80 kg   SpO2 97%   BMI 27.62 kg/m  Physical Exam Vitals and nursing note reviewed.  Constitutional:      Appearance: He is well-developed.     Comments: Laying in bed, tremor, nonverbal  HENT:     Head: Normocephalic and atraumatic.  Eyes:     Conjunctiva/sclera: Conjunctivae normal.  Cardiovascular:     Rate and Rhythm: Regular rhythm. Tachycardia present.  Pulmonary:     Effort: Pulmonary effort is normal.  Abdominal:     Palpations: Abdomen is soft.  Genitourinary:    Comments: Dry diaper in place Musculoskeletal:        General: No swelling.     Cervical back: Neck supple.     Comments: LE edema, dependent rubor  Skin:     General: Skin is warm and dry.     Capillary Refill: Capillary refill takes less than 2 seconds.     ED Results / Procedures / Treatments   Labs (all labs ordered are listed, but only abnormal results are displayed) Labs Reviewed  COMPREHENSIVE METABOLIC PANEL - Abnormal; Notable for the following components:      Result Value   Potassium 6.3 (*)    Chloride 121 (*)    CO2 15 (*)    Glucose, Bld 143 (*)    BUN 74 (*)    Creatinine, Ser 3.61 (*)    Calcium 11.8 (*)    Total Protein 6.3 (*)  Albumin 3.4 (*)    GFR, Estimated 17 (*)    All other components within normal limits  CBC WITH DIFFERENTIAL/PLATELET - Abnormal; Notable for the following components:   RBC 3.66 (*)    Hemoglobin 11.7 (*)    HCT 35.1 (*)    All other components within normal limits  URINALYSIS, ROUTINE W REFLEX MICROSCOPIC - Abnormal; Notable for the following components:   APPearance HAZY (*)    Hgb urine dipstick MODERATE (*)    Protein, ur 30 (*)    Leukocytes,Ua LARGE (*)    Bacteria, UA MANY (*)    All other components within normal limits  PROTIME-INR - Abnormal; Notable for the following components:   Prothrombin Time 15.7 (*)    All other components within normal limits  CULTURE, BLOOD (ROUTINE X 2)  CULTURE, BLOOD (ROUTINE X 2)  URINE CULTURE  LACTIC ACID, PLASMA  LACTIC ACID, PLASMA  APTT    EKG None  Radiology CT CHEST ABDOMEN PELVIS WO CONTRAST  Result Date: 04/10/2023 CLINICAL DATA:  Known UTI with altered mental status EXAM: CT CHEST, ABDOMEN AND PELVIS WITHOUT CONTRAST TECHNIQUE: Multidetector CT imaging of the chest, abdomen and pelvis was performed following the standard protocol without IV contrast. RADIATION DOSE REDUCTION: This exam was performed according to the departmental dose-optimization program which includes automated exposure control, adjustment of the mA and/or kV according to patient size and/or use of iterative reconstruction technique. COMPARISON:  CTA chest  dated 09/23/2022, CT cervical spine dated 09/14/2022, CT abdomen and pelvis dated 08/31/2004 FINDINGS: CT CHEST FINDINGS Cardiovascular: Normal heart size. No significant pericardial fluid/thickening. Great vessels are normal in course and caliber. Coronary artery calcifications. Mediastinum/Nodes: Imaged thyroid gland without nodules meeting criteria for imaging follow-up by size. Normal esophagus. No pathologically enlarged axillary, supraclavicular, mediastinal, or hilar lymph nodes. Lungs/Pleura: The central airways are patent. Left lower lobe subsegmental atelectasis. No pneumothorax. Trace left pleural effusion. Musculoskeletal: No acute or abnormal lytic or blastic osseous lesions. Multilevel degenerative changes of the thoracic spine. Increased anterior wedging of T1 with new grade 1 anterolisthesis at C7-T1 compared to 09/14/2022. Mandible with periapical lucency along the left frontal and lateral incisors. CT ABDOMEN PELVIS FINDINGS Hepatobiliary: Mildly nodular hepatic contour. No intra or extrahepatic biliary ductal dilation. Normal gallbladder. Pancreas: No focal lesions or main ductal dilation. Spleen: Normal in size without focal abnormality. Adrenals/Urinary Tract: No adrenal nodules. No suspicious renal mass, calculi, or hydronephrosis. Circumferential mural thickening of the underdistended bladder with catheter in-situ. Stomach/Bowel: Normal appearance of the stomach. No evidence of bowel wall thickening, distention, or inflammatory changes. Colonic diverticulosis without acute diverticulitis. Normal appendix. Vascular/Lymphatic: Aortic atherosclerosis. No enlarged abdominal or pelvic lymph nodes. Reproductive: Prostate is unremarkable. Other: No free fluid, fluid collection, or free air. Musculoskeletal: No acute or abnormal lytic or blastic osseous findings. Degenerative changes of the bilateral hips. Multilevel degenerative changes of the lumbar spine. Fat containing lower midline anterior  abdominal hernia. IMPRESSION: 1. Circumferential mural thickening of the underdistended bladder with catheter in-situ, which may be due to underdistention or cystitis. 2. Increased anterior wedging of T1 with new grade 1 anterolisthesis at C7-T1 compared to 09/14/2022. 3. Mildly nodular hepatic contour, which can be seen in the setting of cirrhosis. 4. Trace left pleural effusion. 5. Aortic Atherosclerosis (ICD10-I70.0). Coronary artery calcifications. Assessment for potential risk factor modification, dietary therapy or pharmacologic therapy may be warranted, if clinically indicated. Electronically Signed   By: Agustin Cree M.D.   On: 04/10/2023 13:36  DG Chest Port 1 View  Result Date: 04/10/2023 CLINICAL DATA:  Questionable sepsis.  Evaluate for abnormality. EXAM: PORTABLE CHEST 1 VIEW COMPARISON:  11/19/2022. FINDINGS: Left basilar atelectasis. No consolidation or pulmonary edema. Stable cardiac and mediastinal contours. No pleural effusion or pneumothorax. IMPRESSION: Left basilar atelectasis. No consolidation. Electronically Signed   By: Orvan Falconer M.D.   On: 04/10/2023 11:17    Procedures .Critical Care  Performed by: Michelle Piper, PA-C Authorized by: Michelle Piper, PA-C   Critical care provider statement:    Critical care time (minutes):  75   Critical care was necessary to treat or prevent imminent or life-threatening deterioration of the following conditions:  Circulatory failure and renal failure   Critical care was time spent personally by me on the following activities:  Development of treatment plan with patient or surrogate, discussions with consultants, evaluation of patient's response to treatment, examination of patient, ordering and review of laboratory studies, ordering and review of radiographic studies, ordering and performing treatments and interventions, pulse oximetry, re-evaluation of patient's condition and review of old charts   Care discussed with:  admitting provider   Comments:     Sepsis with acute kidney failure     Medications Ordered in ED Medications  vancomycin (VANCOREADY) IVPB 1500 mg/300 mL (1,500 mg Intravenous New Bag/Given 04/10/23 1444)    Followed by  vancomycin (VANCOCIN) IVPB 1000 mg/200 mL premix (has no administration in time range)  lactated ringers bolus 1,000 mL (0 mLs Intravenous Stopped 04/10/23 1352)  insulin aspart (novoLOG) injection 5 Units (5 Units Intravenous Given 04/10/23 1120)    And  dextrose 50 % solution 50 mL (50 mLs Intravenous Given 04/10/23 1120)  lactated ringers bolus 1,000 mL (1,000 mLs Intravenous New Bag/Given 04/10/23 1356)  ceFEPIme (MAXIPIME) 2 g in sodium chloride 0.9 % 100 mL IVPB (2 g Intravenous New Bag/Given 04/10/23 1357)    ED Course/ Medical Decision Making/ A&P Clinical Course as of 04/10/23 1451  Thu Apr 10, 2023  1011 Spoke with [facility] [employer]. Altered since Monday, progressive, usually mostly independent, significantly worse this morning, positive for UTI Wednesday, treated that day, [facility] hospice pt [AS]  1040 Spoke with [facility] hospice care. They will contact on-call RN for information and call back [AS]  1055 [facility] will contact niece to discuss plans of care and call back [AS]  1103 [facility] RN Victorino Dike contacted nursing home who spoke with the patient's niece who is the POA who requested patient be sent to an emergency department and undergo any interventions that hospice care would cover.  This includes labs, IV medications, admission [AS]  1122 [facility] RN Okey Regal at bedside. Comfort measures were developed on Tuesday but not currently in place.  [AS]  1431 Spoke with hospitalist who will admit [AS]    Clinical Course User Index [AS] Tania Perrott, Edsel Petrin, PA-C                             Medical Decision Making Amount and/or Complexity of Data Reviewed Labs: ordered. Radiology: ordered. ECG/medicine tests: ordered.  Risk OTC drugs. Prescription drug  management. Decision regarding hospitalization.  This patient presents to the ED for concern of altered mental status, this involves an extensive number of treatment options, and is a complaint that carries with it a high risk of complications and morbidity. The differential diagnosis for AMS is extensive and includes, but is not limited to: drug overdose -  opioids, alcohol, sedatives, antipsychotics, drug withdrawal, others; Metabolic: hypoxia, hypoglycemia, hyperglycemia, hypercalcemia, hypernatremia, hyponatremia, uremia, hepatic encephalopathy, hypothyroidism, hyperthyroidism, vitamin B12 or thiamine deficiency, carbon monoxide poisoning, Wilson's disease, Lactic acidosis, DKA/HHOS; Infectious: meningitis, encephalitis, bacteremia/sepsis, urinary tract infection, pneumonia, neurosyphilis; Structural: Space-occupying lesion, (brain tumor, subdural hematoma, hydrocephalus,); Vascular: stroke, subarachnoid hemorrhage, coronary ischemia, hypertensive encephalopathy, CNS vasculitis, thrombotic thrombocytopenic purpura, disseminated intravascular coagulation, hyperviscosity; Psychiatric: Schizophrenia, depression; Other: Seizure, hypothermia, heat stroke, ICU psychosis, dementia -"sundowning."   Co morbidities that complicate the patient evaluation  significant history including but not limited to COPD, type 2 diabetes, hypertension, CHF with an EF of 50 to 55% on echo from 09/23/2022, iron deficiency anemia   My initial workup includes sepsis workup  Additional history obtained from: Nursing notes from this visit. EMS provides a portion of the history [employer] nursing home staff [facility] hospice care  I ordered, reviewed and interpreted labs which include: sepsis labs.  Stable anemia with hemoglobin of 11.7.  White blood cell count of 5.5.  I ordered imaging studies including chest x-ray, CT chest abdomen pelvis without contrast I independently visualized and interpreted imaging which showed  evidence of cystitis on CT chest abdomen pelvis, no other acute abnormalities I agree with the radiologist interpretation  Cardiac Monitoring:  The patient was maintained on a cardiac monitor.  I personally viewed and interpreted the cardiac monitored which showed an underlying rhythm of: Sinus tachycardia  Consultations Obtained:  I requested consultation with the hospitalist Dr. Arbutus Leas,  and discussed lab and imaging findings as well as pertinent plan - they recommend: Admission  Afebrile, tachycardic with soft blood pressures.  77 year old male presenting to the ED for evaluation of altered mental status.  He is a DNR/DNI and on hospice care.  He was diagnosed with a UTI a few days ago.  He had 2 doses of his antibiotic yesterday.  He was more acutely altered today.  On my evaluation patient is nonverbal and does not contribute to his own history.  He has no adventitious breath sounds.  He has no guarding of the abdomen on palpation.  I had significant conversations with his hospice nurse and the nursing home regarding treatment.  Patient will be treated for sepsis with broad-spectrum antibiotics including vancomycin and cefepime.  He was given a fluid bolus of 2 L of LR.  He will be admitted to the hospital service for continued management.  Patient's case discussed with Dr. Posey Rea who agrees with plan to admit.   Note: Portions of this report may have been transcribed using voice recognition software. Every effort was made to ensure accuracy; however, inadvertent computerized transcription errors may still be present.        Final Clinical Impression(s) / ED Diagnoses Final diagnoses:  Sepsis with acute renal failure, due to unspecified organism, unspecified acute renal failure type, unspecified whether septic shock present [facility])    Rx / DC Orders ED Discharge Orders     None         Michelle Piper, Cordelia Poche 04/10/23 1451    Glendora Score, MD 04/10/23 2221

## 2023-04-10 NOTE — ED Notes (Signed)
Attempted I&O catheter without success.  Catheter noted to have frank blood, PA notified.  VO for bladder scan.

## 2023-04-11 DIAGNOSIS — N179 Acute kidney failure, unspecified: Secondary | ICD-10-CM | POA: Diagnosis not present

## 2023-04-11 DIAGNOSIS — G9341 Metabolic encephalopathy: Secondary | ICD-10-CM | POA: Diagnosis not present

## 2023-04-11 DIAGNOSIS — A419 Sepsis, unspecified organism: Secondary | ICD-10-CM | POA: Diagnosis not present

## 2023-04-11 LAB — COMPREHENSIVE METABOLIC PANEL
ALT: 30 U/L (ref 0–44)
AST: 19 U/L (ref 15–41)
Albumin: 2.9 g/dL — ABNORMAL LOW (ref 3.5–5.0)
Alkaline Phosphatase: 101 U/L (ref 38–126)
Anion gap: 8 (ref 5–15)
BUN: 68 mg/dL — ABNORMAL HIGH (ref 8–23)
CO2: 13 mmol/L — ABNORMAL LOW (ref 22–32)
Calcium: 10.8 mg/dL — ABNORMAL HIGH (ref 8.9–10.3)
Chloride: 122 mmol/L — ABNORMAL HIGH (ref 98–111)
Creatinine, Ser: 3.22 mg/dL — ABNORMAL HIGH (ref 0.61–1.24)
GFR, Estimated: 19 mL/min — ABNORMAL LOW (ref >60)
Glucose, Bld: 143 mg/dL — ABNORMAL HIGH (ref 70–99)
Potassium: 4.4 mmol/L (ref 3.5–5.1)
Sodium: 143 mmol/L (ref 135–145)
Total Bilirubin: 1 mg/dL (ref 0.3–1.2)
Total Protein: 5.6 g/dL — ABNORMAL LOW (ref 6.5–8.1)

## 2023-04-11 LAB — CBC
HCT: 33.8 % — ABNORMAL LOW (ref 39.0–52.0)
Hemoglobin: 11.2 g/dL — ABNORMAL LOW (ref 13.0–17.0)
MCH: 32.2 pg (ref 26.0–34.0)
MCHC: 33.1 g/dL (ref 30.0–36.0)
MCV: 97.1 fL (ref 80.0–100.0)
Platelets: 154 10*3/uL (ref 150–400)
RBC: 3.48 MIL/uL — ABNORMAL LOW (ref 4.22–5.81)
RDW: 13.8 % (ref 11.5–15.5)
WBC: 5.9 10*3/uL (ref 4.0–10.5)
nRBC: 0 % (ref 0.0–0.2)

## 2023-04-11 LAB — URINE CULTURE

## 2023-04-11 LAB — MAGNESIUM: Magnesium: 1.3 mg/dL — ABNORMAL LOW (ref 1.7–2.4)

## 2023-04-11 LAB — PARATHYROID HORMONE, INTACT (NO CA): PTH: 12 pg/mL — ABNORMAL LOW (ref 15–65)

## 2023-04-11 LAB — HEMOGLOBIN A1C
Hgb A1c MFr Bld: 5.1 % (ref 4.8–5.6)
Mean Plasma Glucose: 100 mg/dL

## 2023-04-11 LAB — MRSA NEXT GEN BY PCR, NASAL: MRSA by PCR Next Gen: NOT DETECTED

## 2023-04-11 MED ORDER — SODIUM BICARBONATE 8.4 % IV SOLN
INTRAVENOUS | Status: DC
  Filled 2023-04-11 (×4): qty 1000

## 2023-04-11 MED ORDER — MAGNESIUM SULFATE 2 GM/50ML IV SOLN
2.0000 g | Freq: Once | INTRAVENOUS | Status: AC
  Administered 2023-04-11: 2 g via INTRAVENOUS
  Filled 2023-04-11: qty 50

## 2023-04-11 MED ORDER — SODIUM CHLORIDE 0.9 % IV BOLUS
1000.0000 mL | Freq: Once | INTRAVENOUS | Status: AC
  Administered 2023-04-11: 1000 mL via INTRAVENOUS

## 2023-04-11 NOTE — Plan of Care (Signed)

## 2023-04-11 NOTE — Evaluation (Signed)
Clinical/Bedside Swallow Evaluation Patient Details  Name: Michael [name] MRN: [id number] Date of Birth: [birth year]  Today's Date: 04/11/2023 Time: SLP Start Time (ACUTE ONLY): 1415 SLP Stop Time (ACUTE ONLY): 1430 SLP Time Calculation (min) (ACUTE ONLY): 15 min  Past Medical History:  Past Medical History:  Diagnosis Date   Arthritis    Congestive heart disease (HCC) 11/2015   COPD (chronic obstructive pulmonary disease) (HCC)    DDD (degenerative disc disease), lumbosacral    Demand ischemia 09/23/2019   Depression    Diabetic neuropathy (HCC)    Diastolic dysfunction    Essential hypertension    GERD (gastroesophageal reflux disease)    Hyperlipidemia    Iron deficiency anemia    Left knee DJD    Lumbar spinal stenosis    Lymphedema 11/2015   BOTH LEGS   NASH (nonalcoholic steatohepatitis)    Peripheral edema    Peripheral edema 10/05/2015   Type 2 diabetes mellitus (HCC)    Past Surgical History:  Past Surgical History:  Procedure Laterality Date   BIOPSY N/A 07/21/2014   Procedure: BIOPSY;  Surgeon: Corbin Ade, MD;  Location: [facility];  Service: Endoscopy;  Laterality: N/A;   CATARACT EXTRACTION W/PHACO Right 07/08/2016   Procedure: CATARACT EXTRACTION PHACO AND INTRAOCULAR LENS PLACEMENT RIGHT EYE;  Surgeon: Gemma Payor, MD;  Location: [facility];  Service: Ophthalmology;  Laterality: Right;  CDE: 9.01   CATARACT EXTRACTION W/PHACO Left 07/22/2016   Procedure: CATARACT EXTRACTION PHACO AND INTRAOCULAR LENS PLACEMENT LEFT EYE CDE=10.69;  Surgeon: Gemma Payor, MD;  Location: [facility];  Service: Ophthalmology;  Laterality: Left;  left -    COLONOSCOPY  2011   [facility]: normal colon, normal distal ileum   COLONOSCOPY WITH PROPOFOL N/A 07/21/2014   Procedure: ATTEMPTED COLONOSCOPY WITH PROPOFOL-HAD TO STOP DUE TO BRADYCARDIA;  Surgeon: Corbin Ade, MD;  Location: [facility];  Service: Endoscopy;  Laterality: N/A;   EGD with enteroscopy  2011   [facility]: normal esophagus and  stomach. Normal duodenum, jejunum. No evidence of AVMs.    ESOPHAGOGASTRODUODENOSCOPY (EGD) WITH PROPOFOL N/A 07/21/2014   Procedure: ESOPHAGOGASTRODUODENOSCOPY (EGD) WITH PROPOFOL;  Surgeon: Corbin Ade, MD;  Location: [facility];  Service: Endoscopy;  Laterality: N/A;   HERNIA REPAIR     KNEE ARTHROSCOPY WITH MEDIAL MENISECTOMY Left 11/06/2012   Procedure: KNEE ARTHROSCOPY WITH MEDIAL MENISECTOMY;  Surgeon: Vickki Hearing, MD;  Location: [facility];  Service: Orthopedics;  Laterality: Left;   Lipoma removal     Stomach   LUMBAR LAMINECTOMY/DECOMPRESSION MICRODISCECTOMY Left 08/30/2013   Procedure: LUMBAR LAMINECTOMY/DECOMPRESSION MICRODISCECTOMY LEFT  LUMBAR TWO THREE;  Surgeon: Clydene Fake, MD;  Location: [facility];  Service: Neurosurgery;  Laterality: Left;   RIGHT/LEFT HEART CATH AND CORONARY ANGIOGRAPHY N/A 09/22/2019   Procedure: RIGHT/LEFT HEART CATH AND CORONARY ANGIOGRAPHY;  Surgeon: [place], Peter M, MD;  Location: [facility] INVASIVE CV LAB;  Service: Cardiovascular;  Laterality: N/A;   SHOULDER SURGERY     Rght-rotator cuff   TOOTH EXTRACTION     HPI:  Michael [name] is a 77 year old male with a history of combined CHF (prior EF 15-20%), NASH, diabetes mellitus type 2, hypertension, hypothyroidism, venous stasis dermatitis presenting from [facility] with altered mental status.  Apparently, the patient has been getting more confused since 7024.  A UA was obtained at that time and suggested possible UTI.  The patient was started on cephalexin at his facility.  Unfortunately, the patient's confusion continue to worsen.  At baseline, the patient gets around on a wheelchair up, but is able to make transfers.  However in the last 24 hours, he has not been able to get out of bed, and he has been less responsive.  As result, the patient was brought to the emergency department for further evaluation and treatment.  At the time of my evaluation, the patient is not able to provide a significant  history.  He is able to answer some simple yes/no questions.  History is obtained from review of medical record and speaking with EDP.  At baseline, the patient is pleasantly confused but he is able to carry on a conversation.  At the time of my evaluation, the patient denies chest pain, shortness breath, abdominal pain, headache.  Remainder review of systems is unobtainable secondary the patient 's encephalopathy.    Assessment / Plan / Recommendation  Clinical Impression  Clinical swallowing evaluation completed while Pt was sitting upright in bed; Note MD at bedside and Pt mouth breathing upon SLP entering the room. Oral mechanism exam reveals xerostomia with dry scab like dried secretions on lingual surface. SLP cleansed oral cavity with toothettes removing dried secretions from lingual and labial surfaces. Note primary cognitive based dysphagia. Pt demonstrated oral holding of ice chips and required cues to "close your mouth" and "swallow". Suspect delayed swallowing trigger upon palpation of swallow, however no coughing or wet vocal quality was observed despite delay and audible swallow. Pt did accept thin trials via straw requiring clear instructions, "I am giving you a straw, take a sip of water". Again, note suspected delayed swallow likely d/t poor oral awareness and cognitive based dysphagia, but no coughing or wet vocal quality noted. Pt consumed puree textures without incident. Pt was unable to masticate solid textures ? again cognitive based and likely negatively impacted by sparse dentition. Recommend downgrade Pt's diet to D1/puree and continue with thin liquids. Recommend meds whole with puree. ST will continue to follow acutely, thank you for this referral SLP Visit Diagnosis: Dysphagia, unspecified (R13.10)    Aspiration Risk  Mild aspiration risk    Diet Recommendation Dysphagia 1 (Puree);Thin liquid    Liquid Administration via: Cup;Straw Medication Administration: Whole meds with  puree Supervision: Full supervision/cueing for compensatory strategies Compensations: Minimize environmental distractions;Slow rate;Small sips/bites Postural Changes: Seated upright at 90 degrees    Other  Recommendations Oral Care Recommendations: Oral care QID    Recommendations for follow up therapy are one component of a multi-disciplinary discharge planning process, led by the attending physician.  Recommendations may be updated based on patient status, additional functional criteria and insurance authorization.  Follow up Recommendations Skilled nursing-short term rehab (<3 hours/day)      Assistance Recommended at Discharge    Functional Status Assessment    Frequency and Duration min 1 x/week  1 week       Prognosis Prognosis for improved oropharyngeal function: Fair Barriers to Reach Goals: Cognitive deficits      Swallow Study   General HPI: Michael [name] is a 77 year old male with a history of combined CHF (prior EF 15-20%), NASH, diabetes mellitus type 2, hypertension, hypothyroidism, venous stasis dermatitis presenting from [facility] with altered mental status.  Apparently, the patient has been getting more confused since 7024.  A UA was obtained at that time and suggested possible UTI.  The patient was started on cephalexin at his facility.  Unfortunately, the patient's confusion continue to worsen.  At baseline, the patient gets around on a  wheelchair up, but is able to make transfers.  However in the last 24 hours, he has not been able to get out of bed, and he has been less responsive.  As result, the patient was brought to the emergency department for further evaluation and treatment.  At the time of my evaluation, the patient is not able to provide a significant history.  He is able to answer some simple yes/no questions.  History is obtained from review of medical record and speaking with EDP.  At baseline, the patient is pleasantly confused but he is able to carry  on a conversation.  At the time of my evaluation, the patient denies chest pain, shortness breath, abdominal pain, headache.  Remainder review of systems is unobtainable secondary the patient 's encephalopathy. Type of Study: Bedside Swallow Evaluation Previous Swallow Assessment: none in chart Diet Prior to this Study: Regular;Thin liquids (Level 0) Temperature Spikes Noted: No Respiratory Status: Nasal cannula History of Recent Intubation: No Behavior/Cognition: Alert;Cooperative;Pleasant mood Oral Cavity Assessment: Dry;Dried secretions Oral Care Completed by SLP: Yes Oral Cavity - Dentition: Missing dentition;Poor condition Vision: Functional for self-feeding Self-Feeding Abilities: Total assist Patient Positioning: Upright in bed Baseline Vocal Quality: Low vocal intensity Volitional Cough: Cognitively unable to elicit Volitional Swallow: Unable to elicit    Oral/Motor/Sensory Function Overall Oral Motor/Sensory Function: Within functional limits   Ice Chips Ice chips: Within functional limits   Thin Liquid Thin Liquid: Impaired Presentation: Straw Oral Phase Impairments: Poor awareness of bolus Pharyngeal  Phase Impairments: Suspected delayed Swallow    Nectar Thick Nectar Thick Liquid: Not tested   Honey Thick Honey Thick Liquid: Not tested   Puree Puree: Impaired Oral Phase Impairments: Poor awareness of bolus;Reduced lingual movement/coordination Oral Phase Functional Implications: Prolonged oral transit Pharyngeal Phase Impairments: Suspected delayed Swallow   Solid     Solid: Impaired Presentation: Spoon Oral Phase Impairments: Poor awareness of bolus Oral Phase Functional Implications: Prolonged oral transit;Oral holding Pharyngeal Phase Impairments:  (manually removed from oral cavity)     Angeleen Horney H. Romie Levee, CCC-SLP Speech Language Pathologist  Georgetta Haber 04/11/2023,2:43 PM

## 2023-04-11 NOTE — Progress Notes (Signed)
PROGRESS NOTE  Michael [name]:[id number] DOB: 06-Jul-1946 DOA: 04/10/2023 PCP: [facility]  Brief History:  77 year old male with a history of combined CHF (prior EF 15-20%), NASH, diabetes mellitus type 2, hypertension, hypothyroidism, venous stasis dermatitis presenting from [facility] with altered mental status.  Apparently, the patient has been getting more confused since 04/08/23.  A UA was obtained at that time and suggested possible UTI.  The patient was started on cephalexin at his facility.  Unfortunately, the patient's confusion continue to worsen.  At baseline, the patient gets around on a wheelchair up, but is able to make transfers.  However in the last 24 hours, he has not been able to get out of bed, and he has been less responsive.  As result, the patient was brought to the emergency department for further evaluation and treatment.  At the time of my evaluation, the patient is not able to provide a significant history.  He is able to answer some simple yes/no questions.  History is obtained from review of medical record and speaking with EDP.  At baseline, the patient is pleasantly confused but he is able to carry on a conversation.  At the time of my evaluation, the patient denies chest pain, shortness breath, abdominal pain, headache.  Remainder review of systems is unobtainable secondary the patient 's encephalopathy. In the ED, the patient had low-grade temperature 99.5 F.  He was tachycardic up to the 130s.  The patient was initially hypotensive with a blood pressure of 80/58.  The patient was fluid resuscitated and given 2 L of LR.  He was started on cefepime and vancomycin.  CT of the chest, abdomen, and pelvis was negative for any infiltrates or edema.  There was circumferential mural thickening of his bladder.  A Foley catheter was pleasant.  There was no abnormal lytic or blastic lesions of his thoracic spine.  There was LLL subsegmental atelectasis.  There  is a nodular hepatic contour.  WBC 5.5, hemoglobin 1.7, platelets 269,000.  LFTs unremarkable.  Albumin 3.4, calcium 11.8 with the corrected calcium of 12.2.  Sodium 143, potassium 6.3, bicarbonate 15, serum creatinine 3.61.  The patient was given temporizing measures for his hyperkalemia.  Lactic acid 1.1>> 1.6.  UA showed>50 WBC.  EKG shows sinus tachycardia with nonspecific T wave changes.   Assessment/Plan: Sepsis -Present on admission -Secondary to urine source -UA>50 WBC -Chest x-ray negative for infiltrates -CT chest, A/P--negative for any acute findings except increased anterior wedging of T1 with new grade 1 anterolisthesis C7-T1 -Follow blood cultures and urine cultures -Continue empiric vanc and  cefepime   UTI -continue empiric vanc and cefepime pending culture data -Foley catheter was placed in the ED with 350 cc out   Acute on chronic renal failure-CKD 3a -Baseline creatinine 1.0-1.3 -Presented with serum creatinine 3.61 -Secondary to volume depletion and hemodynamic changes -Continue IV fluids and trend BMP -Temporarily holding Bumex   Acute metabolic encephalopathy -Secondary to infectious process and hypercalcemia and AKI -pt has cognitive impairment at baseline -7/12--some improvement, but remains confused   Hypercalcemia -check intact PTH -25 vitamin D--34.99 -PTh-rp -continue IVF   Hyperkalemia -anticipate improvement with IVF -temporizing measures given -lokelma -improved   Chronic HFimprEF -09/20/2019 echo EF 15-20%, AK of the distal one third LV -09/23/2022 echo EF 50 to 55%, grade 2 DD, trivial MR -Holding Entresto secondary to AKI -Temporarily holding Bumex -Holding carvedilol secondary to hypotension   Hypothyroidism -  Continue Synthroid   Diabetes mellitus type 2 -Holding metformin -NovoLog sliding scale -06/20/2022 hemoglobin A1c 6.3 -04/10/23 A1C--5.1   Hyperlipidemia -Continue statin   Diabetic polyneuropathy -Holding gabapentin  in the setting of myoclonus with AKI        Family Communication:   niece updated 7/12  Consultants:  none  Code Status:   DNR  DVT Prophylaxis:  Albin Heparin    Procedures: As Listed in Progress Note Above  Antibiotics: Vanc 7/11>> Cefepime 7/11>>        Subjective:  Patient is awake.  Pleasantly confused.  Denies cp, sob, abd pain.  Remainder ROS not obtainable due to confusion Objective: Vitals:   04/11/23 1103 04/11/23 1130 04/11/23 1258 04/11/23 1300  BP: (!) 106/57  (!) 98/53 (!) 97/38  Pulse: (!) 109  (!) 104 (!) 101  Resp: (!) 24  18 12   Temp:  98.7 F (37.1 C)    TempSrc:  Oral    SpO2: 96%  98% 95%  Weight:      Height:        Intake/Output Summary (Last 24 hours) at 04/11/2023 1429 Last data filed at 04/11/2023 1120 Gross per 24 hour  Intake --  Output 1650 ml  Net -1650 ml   Weight change:  Exam:  General:  Pt is awake and alert. Follows 1-step commands appropriately, not in acute distress HEENT: No icterus, No thrush, No neck mass, [place]/AT Cardiovascular: RRR, S1/S2, no rubs, no gallops Respiratory: bibasilar rales. No wheeze Abdomen: Soft/+BS, non tender, non distended, no guarding Extremities: No edema, No lymphangitis, No petechiae, No rashes, no synovitis   Data Reviewed: I have personally reviewed following labs and imaging studies Basic Metabolic Panel: Recent Labs  Lab 04/10/23 1017 04/11/23 0424  NA 143 143  K 6.3* 4.4  CL 121* 122*  CO2 15* 13*  GLUCOSE 143* 143*  BUN 74* 68*  CREATININE 3.61* 3.22*  CALCIUM 11.8* 10.8*  MG  --  1.3*   Liver Function Tests: Recent Labs  Lab 04/10/23 1017 04/11/23 0424  AST 17 19  ALT 31 30  ALKPHOS 117 101  BILITOT 0.9 1.0  PROT 6.3* 5.6*  ALBUMIN 3.4* 2.9*   No results for input(s): "LIPASE", "AMYLASE" in the last 168 hours. No results for input(s): "AMMONIA" in the last 168 hours. Coagulation Profile: Recent Labs  Lab 04/10/23 1107  INR 1.2   CBC: Recent Labs  Lab  04/10/23 1017 04/11/23 0424  WBC 5.5 5.9  NEUTROABS 4.2  --   HGB 11.7* 11.2*  HCT 35.1* 33.8*  MCV 95.9 97.1  PLT 169 154   Cardiac Enzymes: No results for input(s): "CKTOTAL", "CKMB", "CKMBINDEX", "TROPONINI" in the last 168 hours. BNP: Invalid input(s): "POCBNP" CBG: No results for input(s): "GLUCAP" in the last 168 hours. HbA1C: Recent Labs    04/10/23 1730  HGBA1C 5.1   Urine analysis:    Component Value Date/Time   COLORURINE YELLOW 04/10/2023 1043   APPEARANCEUR HAZY (A) 04/10/2023 1043   APPEARANCEUR Clear 12/21/2021 1054   LABSPEC 1.011 04/10/2023 1043   PHURINE 5.0 04/10/2023 1043   GLUCOSEU NEGATIVE 04/10/2023 1043   HGBUR MODERATE (A) 04/10/2023 1043   BILIRUBINUR NEGATIVE 04/10/2023 1043   BILIRUBINUR negative 04/25/2022 0857   BILIRUBINUR Negative 12/21/2021 1054   KETONESUR NEGATIVE 04/10/2023 1043   PROTEINUR 30 (A) 04/10/2023 1043   UROBILINOGEN 0.2 04/25/2022 0857   UROBILINOGEN 0.2 08/24/2013 1129   NITRITE NEGATIVE 04/10/2023 1043   LEUKOCYTESUR LARGE (A) 04/10/2023  1043   Sepsis Labs: @LABRCNTIP (procalcitonin:4,lacticidven:4) ) Recent Results (from the past 240 hour(s))  Urine Culture (for pregnant, neutropenic or urologic patients or patients with an indwelling urinary catheter)     Status: Abnormal   Collection Time: 04/10/23 10:43 AM   Specimen: Urine, Clean Catch  Result Value Ref Range Status   Specimen Description   Final    URINE, CLEAN CATCH Performed at [facility], [address] [place], [place] [address]    Special Requests   Final    NONE Performed at [facility], [address], [place], [place] [address]    Culture MULTIPLE SPECIES PRESENT, SUGGEST RECOLLECTION (A)  Final   Report Status 04/11/2023 FINAL  Final  Blood Culture (routine x 2)     Status: None (Preliminary result)   Collection Time: 04/10/23 11:07 AM   Specimen: BLOOD  Result Value Ref Range Status   Specimen Description BLOOD RIGHT ANTECUBITAL  Final    Special Requests   Final    Blood Culture adequate volume BOTTLES DRAWN AEROBIC AND ANAEROBIC   Culture   Final    NO GROWTH < 24 HOURS Performed at [facility], [address] [place], [place] [address]    Report Status PENDING  Incomplete  Blood Culture (routine x 2)     Status: None (Preliminary result)   Collection Time: 04/10/23 11:07 AM   Specimen: BLOOD  Result Value Ref Range Status   Specimen Description BLOOD BLOOD LEFT HAND  Final   Special Requests   Final    BOTTLES DRAWN AEROBIC AND ANAEROBIC Blood Culture adequate volume   Culture   Final    NO GROWTH < 24 HOURS Performed at [facility], [address], [place], [place] [address]    Report Status PENDING  Incomplete  MRSA Next Gen by PCR, Nasal     Status: None   Collection Time: 04/10/23  4:04 PM   Specimen: Nasal Mucosa; Nasal Swab  Result Value Ref Range Status   MRSA by PCR Next Gen NOT DETECTED NOT DETECTED Final    Comment: (NOTE) The GeneXpert MRSA Assay (FDA approved for NASAL specimens only), is one component of a comprehensive MRSA colonization surveillance program. It is not intended to diagnose MRSA infection nor to guide or monitor treatment for MRSA infections. Test performance is not FDA approved in patients less than 37 years old. Performed at [facility], [address] [place], [place] [address]      Scheduled Meds:  Chlorhexidine Gluconate Cloth  6 each Topical Daily   heparin  5,000 Units Subcutaneous Q8H   lactulose  10 g Oral TID   levothyroxine  100 mcg Oral QAC breakfast   sertraline  50 mg Oral Daily   tamsulosin  0.4 mg Oral Daily   Continuous Infusions:  sodium chloride 75 mL/hr at 04/11/23 1238   ceFEPime (MAXIPIME) IV 2 g (04/11/23 1340)   levETIRAcetam 500 mg (04/11/23 0457)   sodium chloride     [START ON 04/12/2023] vancomycin      Procedures/Studies: CT CHEST ABDOMEN PELVIS WO CONTRAST  Result Date: 04/10/2023 CLINICAL DATA:  Known UTI with altered mental status  EXAM: CT CHEST, ABDOMEN AND PELVIS WITHOUT CONTRAST TECHNIQUE: Multidetector CT imaging of the chest, abdomen and pelvis was performed following the standard protocol without IV contrast. RADIATION DOSE REDUCTION: This exam was performed according to the departmental dose-optimization program which includes automated exposure control, adjustment of the mA and/or kV according to patient size and/or use  of iterative reconstruction technique. COMPARISON:  CTA chest dated 09/23/2022, CT cervical spine dated 09/14/2022, CT abdomen and pelvis dated 08/31/2004 FINDINGS: CT CHEST FINDINGS Cardiovascular: Normal heart size. No significant pericardial fluid/thickening. Great vessels are normal in course and caliber. Coronary artery calcifications. Mediastinum/Nodes: Imaged thyroid gland without nodules meeting criteria for imaging follow-up by size. Normal esophagus. No pathologically enlarged axillary, supraclavicular, mediastinal, or hilar lymph nodes. Lungs/Pleura: The central airways are patent. Left lower lobe subsegmental atelectasis. No pneumothorax. Trace left pleural effusion. Musculoskeletal: No acute or abnormal lytic or blastic osseous lesions. Multilevel degenerative changes of the thoracic spine. Increased anterior wedging of T1 with new grade 1 anterolisthesis at C7-T1 compared to 09/14/2022. Mandible with periapical lucency along the left frontal and lateral incisors. CT ABDOMEN PELVIS FINDINGS Hepatobiliary: Mildly nodular hepatic contour. No intra or extrahepatic biliary ductal dilation. Normal gallbladder. Pancreas: No focal lesions or main ductal dilation. Spleen: Normal in size without focal abnormality. Adrenals/Urinary Tract: No adrenal nodules. No suspicious renal mass, calculi, or hydronephrosis. Circumferential mural thickening of the underdistended bladder with catheter in-situ. Stomach/Bowel: Normal appearance of the stomach. No evidence of bowel wall thickening, distention, or inflammatory  changes. Colonic diverticulosis without acute diverticulitis. Normal appendix. Vascular/Lymphatic: Aortic atherosclerosis. No enlarged abdominal or pelvic lymph nodes. Reproductive: Prostate is unremarkable. Other: No free fluid, fluid collection, or free air. Musculoskeletal: No acute or abnormal lytic or blastic osseous findings. Degenerative changes of the bilateral hips. Multilevel degenerative changes of the lumbar spine. Fat containing lower midline anterior abdominal hernia. IMPRESSION: 1. Circumferential mural thickening of the underdistended bladder with catheter in-situ, which may be due to underdistention or cystitis. 2. Increased anterior wedging of T1 with new grade 1 anterolisthesis at C7-T1 compared to 09/14/2022. 3. Mildly nodular hepatic contour, which can be seen in the setting of cirrhosis. 4. Trace left pleural effusion. 5. Aortic Atherosclerosis (ICD10-I70.0). Coronary artery calcifications. Assessment for potential risk factor modification, dietary therapy or pharmacologic therapy may be warranted, if clinically indicated. Electronically Signed   By: Agustin Cree M.D.   On: 04/10/2023 13:36   DG Chest Port 1 View  Result Date: 04/10/2023 CLINICAL DATA:  Questionable sepsis.  Evaluate for abnormality. EXAM: PORTABLE CHEST 1 VIEW COMPARISON:  11/19/2022. FINDINGS: Left basilar atelectasis. No consolidation or pulmonary edema. Stable cardiac and mediastinal contours. No pleural effusion or pneumothorax. IMPRESSION: Left basilar atelectasis. No consolidation. Electronically Signed   By: Orvan Falconer M.D.   On: 04/10/2023 11:17    Catarina Hartshorn, DO  [facility]  If 7PM-7AM, please contact night-coverage [email or web address] Password [facility] 04/11/2023, 2:29 PM   LOS: 1 day

## 2023-04-11 NOTE — Plan of Care (Signed)

## 2023-04-11 NOTE — Progress Notes (Signed)
   04/11/23 1624  Readmission Prevention Plan - High Risk  Transportation Screening Complete  PCP or Specialist appointment within 5-7 days of discharge Complete  Home Care Consult (High Risk) Not Complete (From [facility])  High Risk Social Work Consult for recovery care planning/counseling (includes patient and caregiver) Complete  High Risk Palliative Care Screening Not Applicable (Pt Hospice care for Heat Failure)  Medication Review Complete

## 2023-04-12 DIAGNOSIS — N179 Acute kidney failure, unspecified: Secondary | ICD-10-CM | POA: Diagnosis not present

## 2023-04-12 DIAGNOSIS — G9341 Metabolic encephalopathy: Secondary | ICD-10-CM | POA: Diagnosis not present

## 2023-04-12 DIAGNOSIS — N3091 Cystitis, unspecified with hematuria: Secondary | ICD-10-CM | POA: Diagnosis not present

## 2023-04-12 DIAGNOSIS — A419 Sepsis, unspecified organism: Secondary | ICD-10-CM | POA: Diagnosis not present

## 2023-04-12 LAB — BASIC METABOLIC PANEL
Anion gap: 7 (ref 5–15)
BUN: 59 mg/dL — ABNORMAL HIGH (ref 8–23)
CO2: 18 mmol/L — ABNORMAL LOW (ref 22–32)
Calcium: 10 mg/dL (ref 8.9–10.3)
Chloride: 121 mmol/L — ABNORMAL HIGH (ref 98–111)
Creatinine, Ser: 2.71 mg/dL — ABNORMAL HIGH (ref 0.61–1.24)
GFR, Estimated: 24 mL/min — ABNORMAL LOW (ref >60)
Glucose, Bld: 121 mg/dL — ABNORMAL HIGH (ref 70–99)
Potassium: 3.3 mmol/L — ABNORMAL LOW (ref 3.5–5.1)
Sodium: 146 mmol/L — ABNORMAL HIGH (ref 135–145)

## 2023-04-12 LAB — CBC
HCT: 30.4 % — ABNORMAL LOW (ref 39.0–52.0)
Hemoglobin: 10.2 g/dL — ABNORMAL LOW (ref 13.0–17.0)
MCH: 32.4 pg (ref 26.0–34.0)
MCHC: 33.6 g/dL (ref 30.0–36.0)
MCV: 96.5 fL (ref 80.0–100.0)
Platelets: 138 10*3/uL — ABNORMAL LOW (ref 150–400)
RBC: 3.15 MIL/uL — ABNORMAL LOW (ref 4.22–5.81)
RDW: 13.8 % (ref 11.5–15.5)
WBC: 4.1 10*3/uL (ref 4.0–10.5)
nRBC: 0 % (ref 0.0–0.2)

## 2023-04-12 LAB — MAGNESIUM: Magnesium: 1.8 mg/dL (ref 1.7–2.4)

## 2023-04-12 LAB — VITAMIN B12: Vitamin B-12: 630 pg/mL (ref 180–914)

## 2023-04-12 LAB — FOLATE: Folate: 35.4 ng/mL (ref >5.9)

## 2023-04-12 MED ORDER — POTASSIUM CHLORIDE CRYS ER 20 MEQ PO TBCR: 20.0000 meq | EXTENDED_RELEASE_TABLET | Freq: Once | ORAL | Status: DC

## 2023-04-12 MED ORDER — POTASSIUM CHLORIDE IN NACL 20-0.45 MEQ/L-% IV SOLN
INTRAVENOUS | Status: AC
  Filled 2023-04-12 (×2): qty 1000

## 2023-04-12 MED ORDER — POTASSIUM CHLORIDE CRYS ER 20 MEQ PO TBCR
20.0000 meq | EXTENDED_RELEASE_TABLET | Freq: Once | ORAL | Status: AC
  Administered 2023-04-12: 20 meq via ORAL
  Filled 2023-04-12: qty 1

## 2023-04-12 NOTE — Plan of Care (Signed)
  Problem: Clinical Measurements: Goal: Diagnostic test results will improve Outcome: Progressing Goal: Cardiovascular complication will be avoided Outcome: Progressing   Problem: Nutrition: Goal: Adequate nutrition will be maintained Outcome: Progressing   Problem: Coping: Goal: Level of anxiety will decrease Outcome: Progressing   Problem: Elimination: Goal: Will not experience complications related to urinary retention Outcome: Progressing

## 2023-04-12 NOTE — TOC Initial Note (Signed)
Transition of Care [facility]) - Initial/Assessment Note    Patient Details  Name: Michael [name] MRN: [id number] Date of Birth: [birth year]  [facility]) CM/SW Contact:    Catalina Gravel, LCSW Phone Number: 04/12/2023, 5:43 PM  Clinical Narrative:                  Patient admitted with Sepsis. Patient has high risk for readmission. Patient is from [facility] and [facility] is actively involved since January.  Niece on record is advocate and only family resource.  Plan to return  to ALF at DC. TOC to follow.  Expected Discharge Plan: Long Term Nursing Home (ALF [place]) Barriers to Discharge: Continued Medical Work up   Patient Goals and CMS Choice Patient states their goals for this hospitalization and ongoing recovery are:: Return to ALF with Hospice in place/niece support          Expected Discharge Plan and Services In-house Referral: Clinical Social Work     Living arrangements for the past 2 months: Assisted Living Facility                                      Prior Living Arrangements/Services Living arrangements for the past 2 months: Assisted Living Facility Lives with:: Self Patient language and need for interpreter reviewed:: Yes        Need for Family Participation in Patient Care: No (Comment) (Niece is an advocate) Care giver support system in place?: Yes (comment) Current home services: Hospice ([facility]) Criminal Activity/Legal Involvement Pertinent to Current Situation/Hospitalization: No - Comment as needed  Activities of Daily Living      Permission Sought/Granted                  Emotional Assessment Appearance:: Appears stated age Attitude/Demeanor/Rapport: Unable to Assess Affect (typically observed): Unable to Assess Orientation: : Oriented to Self Alcohol / Substance Use: Not Applicable    Admission diagnosis:  Sepsis due to undetermined organism (HCC) [A41.9] Sepsis with acute renal  failure, due to unspecified organism, unspecified acute renal failure type, unspecified whether septic shock present (HCC) [A41.9, R65.20, N17.9] Patient Active Problem List   Diagnosis Date Noted   Sepsis due to undetermined organism (HCC) 04/10/2023   Cystitis with hematuria 04/10/2023   Hypercalcemia 04/10/2023   Acute renal failure superimposed on stage 3a chronic kidney disease (HCC) 04/10/2023   UTI (urinary tract infection) 11/18/2022   AKI (acute kidney injury) (HCC) 11/18/2022   Severe sepsis (HCC) 11/18/2022   Acute metabolic encephalopathy 11/18/2022   Tonic-clonic seizure (HCC) 10/01/2022   Pressure injury of skin 10/01/2022   Paroxysmal atrial fibrillation with RVR (HCC) 10/01/2022   Venous stasis dermatitis 10/01/2022   Disorientation 09/19/2022   Hypertensive crisis 09/18/2022   Encounter for general adult medical examination with abnormal findings 06/20/2022   Onychomycosis 06/20/2022   Physical deconditioning 12/21/2021   Acute cystitis without hematuria 07/10/2021   Benign prostatic hyperplasia with urinary frequency 07/10/2021   Vitamin D deficiency 05/23/2021   Anxiety 01/11/2021   Chronic obstructive lung disease (HCC) 01/11/2021   Contact dermatitis and other eczema 01/11/2021   Insulin dependent type 2 diabetes mellitus (HCC) 01/11/2021   Diabetic peripheral neuropathy (HCC) 01/11/2021   Gout 01/11/2021   History of colonic polyps 01/11/2021   Ventral hernia 01/11/2021   Neck pain 11/20/2020   Lymphedema of lower extremity 11/18/2018  Chronic pain syndrome 05/06/2018   Depression, major, single episode, mild (HCC) 06/20/2017   DNR (do not resuscitate) 06/20/2017   Chronic HFrEF (heart failure with reduced ejection fraction) (HCC) 10/23/2016   Hypothyroidism 11/30/2014   Sinus pause 07/21/2014   Difficulty walking 05/03/2014   Acute anemia 07/14/2013   Spinal stenosis of lumbar region 07/06/2013   Carpal tunnel syndrome 04/13/2013   DDD (degenerative  disc disease), lumbosacral 04/13/2013   Peripheral neuropathy 01/05/2013   S/P arthroscopy of left knee 11/09/2012   Osteoarthritis of knee 10/07/2012   NASH (nonalcoholic steatohepatitis) 05/22/2007   Hyperlipidemia 10/15/2006   Class 3 obesity (HCC) 10/15/2006   Hypertension 10/15/2006   Gastroesophageal reflux disease 10/15/2006   PCP:  [facility] Pharmacy:   [facility] [place], [place] - [address]  [place] [address] Phone: [phone] Fax: [phone]  [facility] - [place], [place] - [address] [place] [address] Phone: [phone] Fax: [phone]     Social Determinants of Health (SDOH) Social History: SDOH Screenings   Food Insecurity: No Food Insecurity (09/19/2022)  Housing: Low Risk  (09/19/2022)  Transportation Needs: No Transportation Needs (09/19/2022)  Utilities: Not At Risk (09/19/2022)  Alcohol Screen: Low Risk  (04/18/2022)  Depression (PHQ2-9): Low Risk  (06/20/2022)  Financial Resource Strain: Low Risk  (04/18/2022)  Physical Activity: Inactive (04/18/2022)  Social Connections: Moderately Isolated (04/18/2022)  Stress: No Stress Concern Present (04/18/2022)  Tobacco Use: Medium Risk (04/10/2023)   SDOH Interventions:     Readmission Risk Interventions    04/12/2023    5:39 PM 04/11/2023    4:24 PM 11/19/2022   11:00 AM  Readmission Risk Prevention Plan  Transportation Screening  Complete Complete  PCP or Specialist Appt within 3-5 Days  Complete   HRI or Home Care Consult -- Not Complete   Social Work Consult for Recovery Care Planning/Counseling Complete Complete   Palliative Care Screening Complete Not Applicable   Medication Review Oceanographer) Complete Complete Complete  HRI or Home Care Consult   Complete  SW Recovery Care/Counseling Consult   Complete  Palliative Care Screening   Not Applicable  Skilled  Nursing Facility   Not Applicable

## 2023-04-12 NOTE — Plan of Care (Signed)
  Problem: Clinical Measurements: Goal: Respiratory complications will improve Outcome: Progressing Goal: Cardiovascular complication will be avoided Outcome: Progressing   Problem: Activity: Goal: Risk for activity intolerance will decrease Outcome: Progressing   Problem: Elimination: Goal: Will not experience complications related to bowel motility Outcome: Progressing   Problem: Pain Managment: Goal: General experience of comfort will improve Outcome: Progressing   

## 2023-04-12 NOTE — Progress Notes (Signed)
[facility] nurse at bedside checking on patient.

## 2023-04-12 NOTE — Progress Notes (Addendum)
PROGRESS NOTE  Michael [name] QMV:[id number] DOB: 07/02/1946 DOA: 04/10/2023 PCP: [facility]  Brief History:  77 year old male with a history of combined CHF (prior EF 15-20%), NASH, diabetes mellitus type 2, hypertension, hypothyroidism, venous stasis dermatitis presenting from [facility] with altered mental status.  Apparently, the patient has been getting more confused since 04/08/23.  A UA was obtained at that time and suggested possible UTI.  The patient was started on cephalexin at his facility.  Unfortunately, the patient's confusion continue to worsen.  At baseline, the patient gets around on a wheelchair up, but is able to make transfers.  However in the last 24 hours, he has not been able to get out of bed, and he has been less responsive.  As result, the patient was brought to the emergency department for further evaluation and treatment.  At the time of my evaluation, the patient is not able to provide a significant history.  He is able to answer some simple yes/no questions.  History is obtained from review of medical record and speaking with EDP.  At baseline, the patient is pleasantly confused but he is able to carry on a conversation.  At the time of my evaluation, the patient denies chest pain, shortness breath, abdominal pain, headache.  Remainder review of systems is unobtainable secondary the patient 's encephalopathy. In the ED, the patient had low-grade temperature 99.5 F.  He was tachycardic up to the 130s.  The patient was initially hypotensive with a blood pressure of 80/58.  The patient was fluid resuscitated and given 2 L of LR.  He was started on cefepime and vancomycin.  CT of the chest, abdomen, and pelvis was negative for any infiltrates or edema.  There was circumferential mural thickening of his bladder.  A Foley catheter was pleasant.  There was no abnormal lytic or blastic lesions of his thoracic spine.  There was LLL subsegmental atelectasis.  There  is a nodular hepatic contour.  WBC 5.5, hemoglobin 1.7, platelets 269,000.  LFTs unremarkable.  Albumin 3.4, calcium 11.8 with the corrected calcium of 12.2.  Sodium 143, potassium 6.3, bicarbonate 15, serum creatinine 3.61.  The patient was given temporizing measures for his hyperkalemia.  Lactic acid 1.1>> 1.6.  UA showed>50 WBC.  EKG shows sinus tachycardia with nonspecific T wave changes.   Assessment/Plan: Sepsis -Present on admission -Secondary to urine source -UA>50 WBC -Chest x-ray negative for infiltrates -CT chest, A/P--negative for any acute findings except increased anterior wedging of T1 with new grade 1 anterolisthesis C7-T1 -Follow blood cultures and urine cultures -Initially empiric vanc and  cefepime -d/c vanc   UTI -continue empiric vanc and cefepime pending culture data -Foley catheter was placed in the ED with 350 cc out -urine culture--polymicrobial -d/c vanc   Acute on chronic renal failure-CKD 3a -Baseline creatinine 1.0-1.3 -Presented with serum creatinine 3.61 -Secondary to volume depletion and hemodynamic changes -Continue IV fluids and trend BMP -Temporarily holding Bumex   Acute metabolic encephalopathy -Secondary to infectious process and hypercalcemia and AKI -pt has cognitive impairment at baseline -7/12--some improvement, but remains confused -7/13--continues to improve   Hypercalcemia -check intact PTH--12 -25 vitamin D--34.99 -PTh-rp pending -continue IVF -1, 25 vitamin D -SPEP   Hyperkalemia -anticipate improvement with IVF -temporizing measures given -lokelma x 1 -improved  NAGMA -started on IV bicarb>>d/c   Chronic HFimprEF -09/20/2019 echo EF 15-20%, AK of the distal one third LV -09/23/2022 echo EF 50  to 55%, grade 2 DD, trivial MR -Holding Entresto secondary to AKI -Temporarily holding Bumex -Holding carvedilol secondary to hypotension   Hypothyroidism -Continue Synthroid   Diabetes mellitus type 2 -Holding  metformin -NovoLog sliding scale -06/20/2022 hemoglobin A1c 6.3 -04/10/23 A1C--5.1   Hyperlipidemia -Continue statin   Diabetic polyneuropathy -Holding gabapentin in the setting of myoclonus with AKI           Family Communication:   niece updated 7/12   Consultants:  none   Code Status:   DNR   DVT Prophylaxis:  [place] Heparin      Procedures: As Listed in Progress Note Above   Antibiotics: Vanc 7/11>> Cefepime 7/11>>1/13            Subjective: Patient denies fevers, chills, headache, chest pain, dyspnea, nausea, vomiting, diarrhea, abdominal pain, dysuria, hematuria, hematochezia, and melena.   Objective: Vitals:   04/12/23 0800 04/12/23 0900 04/12/23 1000 04/12/23 1217  BP: (!) 99/38 (!) 94/43 (!) 126/40   Pulse: 74 70 (!) 55   Resp: 13 13 13    Temp:    97.6 F (36.4 C)  TempSrc:    Oral  SpO2: 97% 97% 99%   Weight:      Height:        Intake/Output Summary (Last 24 hours) at 04/12/2023 1402 Last data filed at 04/12/2023 1036 Gross per 24 hour  Intake 4144.24 ml  Output 925 ml  Net 3219.24 ml   Weight change: -17.2 kg Exam:  General:  Pt is alert, follows commands appropriately, not in acute distress HEENT: No icterus, No thrush, No neck mass, [place]/AT Cardiovascular: RRR, S1/S2, no rubs, no gallops Respiratory: CTA bilaterally, no wheezing, no crackles, no rhonchi Abdomen: Soft/+BS, non tender, non distended, no guarding Extremities: No edema, No lymphangitis, No petechiae, No rashes, no synovitis   Data Reviewed: I have personally reviewed following labs and imaging studies Basic Metabolic Panel: Recent Labs  Lab 04/10/23 1017 04/11/23 0424 04/12/23 0523  NA 143 143 146*  K 6.3* 4.4 3.3*  CL 121* 122* 121*  CO2 15* 13* 18*  GLUCOSE 143* 143* 121*  BUN 74* 68* 59*  CREATININE 3.61* 3.22* 2.71*  CALCIUM 11.8* 10.8* 10.0  MG  --  1.3* 1.8   Liver Function Tests: Recent Labs  Lab 04/10/23 1017 04/11/23 0424  AST 17 19  ALT 31 30   ALKPHOS 117 101  BILITOT 0.9 1.0  PROT 6.3* 5.6*  ALBUMIN 3.4* 2.9*   No results for input(s): "LIPASE", "AMYLASE" in the last 168 hours. No results for input(s): "AMMONIA" in the last 168 hours. Coagulation Profile: Recent Labs  Lab 04/10/23 1107  INR 1.2   CBC: Recent Labs  Lab 04/10/23 1017 04/11/23 0424 04/12/23 0523  WBC 5.5 5.9 4.1  NEUTROABS 4.2  --   --   HGB 11.7* 11.2* 10.2*  HCT 35.1* 33.8* 30.4*  MCV 95.9 97.1 96.5  PLT 169 154 138*   Cardiac Enzymes: No results for input(s): "CKTOTAL", "CKMB", "CKMBINDEX", "TROPONINI" in the last 168 hours. BNP: Invalid input(s): "POCBNP" CBG: No results for input(s): "GLUCAP" in the last 168 hours. HbA1C: Recent Labs    04/10/23 1730  HGBA1C 5.1   Urine analysis:    Component Value Date/Time   COLORURINE YELLOW 04/10/2023 1043   APPEARANCEUR HAZY (A) 04/10/2023 1043   APPEARANCEUR Clear 12/21/2021 1054   LABSPEC 1.011 04/10/2023 1043   PHURINE 5.0 04/10/2023 1043   GLUCOSEU NEGATIVE 04/10/2023 1043   HGBUR MODERATE (A) 04/10/2023  1043   BILIRUBINUR NEGATIVE 04/10/2023 1043   BILIRUBINUR negative 04/25/2022 0857   BILIRUBINUR Negative 12/21/2021 1054   KETONESUR NEGATIVE 04/10/2023 1043   PROTEINUR 30 (A) 04/10/2023 1043   UROBILINOGEN 0.2 04/25/2022 0857   UROBILINOGEN 0.2 08/24/2013 1129   NITRITE NEGATIVE 04/10/2023 1043   LEUKOCYTESUR LARGE (A) 04/10/2023 1043   Sepsis Labs: @LABRCNTIP (procalcitonin:4,lacticidven:4) ) Recent Results (from the past 240 hour(s))  Urine Culture (for pregnant, neutropenic or urologic patients or patients with an indwelling urinary catheter)     Status: Abnormal   Collection Time: 04/10/23 10:43 AM   Specimen: Urine, Clean Catch  Result Value Ref Range Status   Specimen Description   Final    URINE, CLEAN CATCH Performed at [facility], [address] [place], [place] [address]    Special Requests   Final    NONE Performed at [facility], [address],  [place], [place] [address]    Culture MULTIPLE SPECIES PRESENT, SUGGEST RECOLLECTION (A)  Final   Report Status 04/11/2023 FINAL  Final  Blood Culture (routine x 2)     Status: None (Preliminary result)   Collection Time: 04/10/23 11:07 AM   Specimen: BLOOD  Result Value Ref Range Status   Specimen Description BLOOD RIGHT ANTECUBITAL  Final   Special Requests   Final    Blood Culture adequate volume BOTTLES DRAWN AEROBIC AND ANAEROBIC   Culture   Final    NO GROWTH 2 DAYS Performed at [facility], [address], [place], [place] [address]    Report Status PENDING  Incomplete  Blood Culture (routine x 2)     Status: None (Preliminary result)   Collection Time: 04/10/23 11:07 AM   Specimen: BLOOD  Result Value Ref Range Status   Specimen Description BLOOD BLOOD LEFT HAND  Final   Special Requests   Final    BOTTLES DRAWN AEROBIC AND ANAEROBIC Blood Culture adequate volume   Culture   Final    NO GROWTH 2 DAYS Performed at [facility], [address], [place], [place] [address]    Report Status PENDING  Incomplete  MRSA Next Gen by PCR, Nasal     Status: None   Collection Time: 04/10/23  4:04 PM   Specimen: Nasal Mucosa; Nasal Swab  Result Value Ref Range Status   MRSA by PCR Next Gen NOT DETECTED NOT DETECTED Final    Comment: (NOTE) The GeneXpert MRSA Assay (FDA approved for NASAL specimens only), is one component of a comprehensive MRSA colonization surveillance program. It is not intended to diagnose MRSA infection nor to guide or monitor treatment for MRSA infections. Test performance is not FDA approved in patients less than 63 years old. Performed at [facility], [address], [place], [place] [address]      Scheduled Meds:  Chlorhexidine Gluconate Cloth  6 each Topical Daily   heparin  5,000 Units Subcutaneous Q8H   lactulose  10 g Oral TID   levothyroxine  100 mcg Oral QAC breakfast   sertraline  50 mg Oral Daily   tamsulosin  0.4 mg Oral Daily   Continuous  Infusions:  ceFEPime (MAXIPIME) IV Stopped (04/11/23 1410)   levETIRAcetam Stopped (04/12/23 0625)   sodium bicarbonate 150 mEq in dextrose 5 % 1,150 mL infusion 75 mL/hr at 04/12/23 0859   vancomycin      Procedures/Studies: CT CHEST ABDOMEN PELVIS WO CONTRAST  Result Date: 04/10/2023 CLINICAL DATA:  Known UTI with altered mental status EXAM: CT CHEST, ABDOMEN AND  PELVIS WITHOUT CONTRAST TECHNIQUE: Multidetector CT imaging of the chest, abdomen and pelvis was performed following the standard protocol without IV contrast. RADIATION DOSE REDUCTION: This exam was performed according to the departmental dose-optimization program which includes automated exposure control, adjustment of the mA and/or kV according to patient size and/or use of iterative reconstruction technique. COMPARISON:  CTA chest dated 09/23/2022, CT cervical spine dated 09/14/2022, CT abdomen and pelvis dated 08/31/2004 FINDINGS: CT CHEST FINDINGS Cardiovascular: Normal heart size. No significant pericardial fluid/thickening. Great vessels are normal in course and caliber. Coronary artery calcifications. Mediastinum/Nodes: Imaged thyroid gland without nodules meeting criteria for imaging follow-up by size. Normal esophagus. No pathologically enlarged axillary, supraclavicular, mediastinal, or hilar lymph nodes. Lungs/Pleura: The central airways are patent. Left lower lobe subsegmental atelectasis. No pneumothorax. Trace left pleural effusion. Musculoskeletal: No acute or abnormal lytic or blastic osseous lesions. Multilevel degenerative changes of the thoracic spine. Increased anterior wedging of T1 with new grade 1 anterolisthesis at C7-T1 compared to 09/14/2022. Mandible with periapical lucency along the left frontal and lateral incisors. CT ABDOMEN PELVIS FINDINGS Hepatobiliary: Mildly nodular hepatic contour. No intra or extrahepatic biliary ductal dilation. Normal gallbladder. Pancreas: No focal lesions or main ductal dilation.  Spleen: Normal in size without focal abnormality. Adrenals/Urinary Tract: No adrenal nodules. No suspicious renal mass, calculi, or hydronephrosis. Circumferential mural thickening of the underdistended bladder with catheter in-situ. Stomach/Bowel: Normal appearance of the stomach. No evidence of bowel wall thickening, distention, or inflammatory changes. Colonic diverticulosis without acute diverticulitis. Normal appendix. Vascular/Lymphatic: Aortic atherosclerosis. No enlarged abdominal or pelvic lymph nodes. Reproductive: Prostate is unremarkable. Other: No free fluid, fluid collection, or free air. Musculoskeletal: No acute or abnormal lytic or blastic osseous findings. Degenerative changes of the bilateral hips. Multilevel degenerative changes of the lumbar spine. Fat containing lower midline anterior abdominal hernia. IMPRESSION: 1. Circumferential mural thickening of the underdistended bladder with catheter in-situ, which may be due to underdistention or cystitis. 2. Increased anterior wedging of T1 with new grade 1 anterolisthesis at C7-T1 compared to 09/14/2022. 3. Mildly nodular hepatic contour, which can be seen in the setting of cirrhosis. 4. Trace left pleural effusion. 5. Aortic Atherosclerosis (ICD10-I70.0). Coronary artery calcifications. Assessment for potential risk factor modification, dietary therapy or pharmacologic therapy may be warranted, if clinically indicated. Electronically Signed   By: Agustin Cree M.D.   On: 04/10/2023 13:36   DG Chest Port 1 View  Result Date: 04/10/2023 CLINICAL DATA:  Questionable sepsis.  Evaluate for abnormality. EXAM: PORTABLE CHEST 1 VIEW COMPARISON:  11/19/2022. FINDINGS: Left basilar atelectasis. No consolidation or pulmonary edema. Stable cardiac and mediastinal contours. No pleural effusion or pneumothorax. IMPRESSION: Left basilar atelectasis. No consolidation. Electronically Signed   By: Orvan Falconer M.D.   On: 04/10/2023 11:17    Catarina Hartshorn,  DO  [facility]  If 7PM-7AM, please contact night-coverage [email or web address] Password [email or web address] 04/12/2023, 2:02 PM   LOS: 2 days

## 2023-04-13 DIAGNOSIS — A419 Sepsis, unspecified organism: Secondary | ICD-10-CM | POA: Diagnosis not present

## 2023-04-13 DIAGNOSIS — I5022 Chronic systolic (congestive) heart failure: Secondary | ICD-10-CM | POA: Diagnosis not present

## 2023-04-13 DIAGNOSIS — G9341 Metabolic encephalopathy: Secondary | ICD-10-CM | POA: Diagnosis not present

## 2023-04-13 DIAGNOSIS — N3091 Cystitis, unspecified with hematuria: Secondary | ICD-10-CM | POA: Diagnosis not present

## 2023-04-13 LAB — BASIC METABOLIC PANEL
Anion gap: 6 (ref 5–15)
BUN: 47 mg/dL — ABNORMAL HIGH (ref 8–23)
CO2: 20 mmol/L — ABNORMAL LOW (ref 22–32)
Calcium: 9.7 mg/dL (ref 8.9–10.3)
Chloride: 116 mmol/L — ABNORMAL HIGH (ref 98–111)
Creatinine, Ser: 2.03 mg/dL — ABNORMAL HIGH (ref 0.61–1.24)
GFR, Estimated: 34 mL/min — ABNORMAL LOW (ref >60)
Glucose, Bld: 96 mg/dL (ref 70–99)
Potassium: 3.8 mmol/L (ref 3.5–5.1)
Sodium: 142 mmol/L (ref 135–145)

## 2023-04-13 LAB — CBC
HCT: 31 % — ABNORMAL LOW (ref 39.0–52.0)
Hemoglobin: 10.6 g/dL — ABNORMAL LOW (ref 13.0–17.0)
MCH: 32.1 pg (ref 26.0–34.0)
MCHC: 34.2 g/dL (ref 30.0–36.0)
MCV: 93.9 fL (ref 80.0–100.0)
Platelets: 139 10*3/uL — ABNORMAL LOW (ref 150–400)
RBC: 3.3 MIL/uL — ABNORMAL LOW (ref 4.22–5.81)
RDW: 13.3 % (ref 11.5–15.5)
WBC: 3.8 10*3/uL — ABNORMAL LOW (ref 4.0–10.5)
nRBC: 0 % (ref 0.0–0.2)

## 2023-04-13 LAB — MAGNESIUM: Magnesium: 1.5 mg/dL — ABNORMAL LOW (ref 1.7–2.4)

## 2023-04-13 MED ORDER — MAGNESIUM SULFATE 2 GM/50ML IV SOLN
2.0000 g | Freq: Once | INTRAVENOUS | Status: AC
  Administered 2023-04-13: 2 g via INTRAVENOUS
  Filled 2023-04-13: qty 50

## 2023-04-13 MED ORDER — POTASSIUM CHLORIDE CRYS ER 20 MEQ PO TBCR
20.0000 meq | EXTENDED_RELEASE_TABLET | Freq: Once | ORAL | Status: AC
  Administered 2023-04-13: 20 meq via ORAL
  Filled 2023-04-13: qty 1

## 2023-04-13 MED ORDER — SODIUM CHLORIDE 0.9 % IV SOLN
1.0000 g | INTRAVENOUS | Status: DC
Start: 2023-04-14 — End: 2023-04-13

## 2023-04-13 MED ORDER — SODIUM CHLORIDE 0.9 % IV SOLN
1.0000 g | INTRAVENOUS | Status: DC
  Administered 2023-04-14 (×2): 1 g via INTRAVENOUS
  Filled 2023-04-13 (×2): qty 10

## 2023-04-13 NOTE — Plan of Care (Signed)
  Problem: Education: Goal: Knowledge of General Education information will improve Description: Including pain rating scale, medication(s)/side effects and non-pharmacologic comfort measures Outcome: Progressing   Problem: Clinical Measurements: Goal: Will remain free from infection Outcome: Progressing Goal: Respiratory complications will improve Outcome: Progressing Goal: Cardiovascular complication will be avoided Outcome: Progressing   Problem: Activity: Goal: Risk for activity intolerance will decrease Outcome: Progressing   Problem: Nutrition: Goal: Adequate nutrition will be maintained Outcome: Not Progressing

## 2023-04-13 NOTE — Progress Notes (Signed)
Foley catheter removed per order.

## 2023-04-13 NOTE — Progress Notes (Signed)
PROGRESS NOTE  Michael [name] NFA:[id number] DOB: 05-18-1946 DOA: 04/10/2023 PCP: [facility]  Brief History:  77 year old male with a history of combined CHF (prior EF 15-20%), NASH, diabetes mellitus type 2, hypertension, hypothyroidism, venous stasis dermatitis presenting from [facility] with altered mental status.  Apparently, the patient has been getting more confused since 04/08/23.  A UA was obtained at that time and suggested possible UTI.  The patient was started on cephalexin at his facility.  Unfortunately, the patient's confusion continue to worsen.  At baseline, the patient gets around on a wheelchair up, but is able to make transfers.  However in the last 24 hours, he has not been able to get out of bed, and he has been less responsive.  As result, the patient was brought to the emergency department for further evaluation and treatment.  At the time of my evaluation, the patient is not able to provide a significant history.  He is able to answer some simple yes/no questions.  History is obtained from review of medical record and speaking with EDP.  At baseline, the patient is pleasantly confused but he is able to carry on a conversation.  At the time of my evaluation, the patient denies chest pain, shortness breath, abdominal pain, headache.  Remainder review of systems is unobtainable secondary the patient 's encephalopathy. In the ED, the patient had low-grade temperature 99.5 F.  He was tachycardic up to the 130s.  The patient was initially hypotensive with a blood pressure of 80/58.  The patient was fluid resuscitated and given 2 L of LR.  He was started on cefepime and vancomycin.  CT of the chest, abdomen, and pelvis was negative for any infiltrates or edema.  There was circumferential mural thickening of his bladder.  A Foley catheter was pleasant.  There was no abnormal lytic or blastic lesions of his thoracic spine.  There was LLL subsegmental atelectasis.  There  is a nodular hepatic contour.  WBC 5.5, hemoglobin 1.7, platelets 269,000.  LFTs unremarkable.  Albumin 3.4, calcium 11.8 with the corrected calcium of 12.2.  Sodium 143, potassium 6.3, bicarbonate 15, serum creatinine 3.61.  The patient was given temporizing measures for his hyperkalemia.  Lactic acid 1.1>> 1.6.  UA showed>50 WBC.  EKG shows sinus tachycardia with nonspecific T wave changes.   Assessment/Plan: Sepsis -Present on admission -Secondary to urine source -UA>50 WBC -Chest x-ray negative for infiltrates -CT chest, A/P--negative for any acute findings except increased anterior wedging of T1 with new grade 1 anterolisthesis C7-T1 -Follow blood cultures and urine cultures -Initially empiric vanc and  cefepime -d/c vanc   UTI -continue empiric vanc and cefepime pending culture data -Foley catheter was placed in the ED with 350 cc out -urine culture--polymicrobial -d/c vanc -change to ceftriaxone   Acute on chronic renal failure-CKD 3a -Baseline creatinine 1.0-1.3 -Presented with serum creatinine 3.61 -Secondary to volume depletion and hemodynamic changes -Continue IV fluids and trend BMP -Temporarily holding Bumex   Acute metabolic encephalopathy -Secondary to infectious process and hypercalcemia and AKI -pt has cognitive impairment at baseline -7/12--some improvement, but remains confused -7/13--continues to improve -7/14--mental status back to baseline   Hypercalcemia -check intact PTH--12 -25 vitamin D--34.99 -PTh-rp pending -continue IVF>>improving -1, 25 vitamin D -SPEP   Hyperkalemia -anticipate improvement with IVF -temporizing measures given -lokelma x 1 -improved   NAGMA -started on IV bicarb>>d/c   Chronic HFimprEF -09/20/2019 echo EF 15-20%, AK of  the distal one third LV -09/23/2022 echo EF 50 to 55%, grade 2 DD, trivial MR -Holding Entresto secondary to AKI -Temporarily holding Bumex -Holding carvedilol secondary to hypotension    Hypothyroidism -Continue Synthroid   Diabetes mellitus type 2 -Holding metformin -NovoLog sliding scale -06/20/2022 hemoglobin A1c 6.3 -04/10/23 A1C--5.1   Hyperlipidemia -Continue statin   Diabetic polyneuropathy -Holding gabapentin in the setting of myoclonus with AKI           Family Communication:   niece updated 7/14   Consultants:  none   Code Status:   DNR   DVT Prophylaxis:  [place] Heparin      Procedures: As Listed in Progress Note Above   Antibiotics: Vanc 7/11>> Cefepime 7/11>>1/13            Subjective: Patient denies fevers, chills, headache, chest pain, dyspnea, nausea, vomiting, diarrhea, abdominal pain, dysuria, hematuria, hematochezia, and melena.   Objective: Vitals:   04/13/23 1300 04/13/23 1400 04/13/23 1530 04/13/23 1606  BP: (!) 112/50 (!) 141/74    Pulse:  92 91 96  Resp:  18 18 15   Temp:    99.3 F (37.4 C)  TempSrc:    Oral  SpO2:  100% 100% 100%  Weight:      Height:        Intake/Output Summary (Last 24 hours) at 04/13/2023 1706 Last data filed at 04/13/2023 1700 Gross per 24 hour  Intake 2316.04 ml  Output 1550 ml  Net 766.04 ml   Weight change: 2.5 kg Exam:  General:  Pt is alert, follows commands appropriately, not in acute distress HEENT: No icterus, No thrush, No neck mass, [place]/AT Cardiovascular: RRR, S1/S2, no rubs, no gallops Respiratory: bibasilar rales.  No wheeze Abdomen: Soft/+BS, non tender, non distended, no guarding Extremities: No edema, No lymphangitis, No petechiae, No rashes, no synovitis   Data Reviewed: I have personally reviewed following labs and imaging studies Basic Metabolic Panel: Recent Labs  Lab 04/10/23 1017 04/11/23 0424 04/12/23 0523 04/13/23 0536  NA 143 143 146* 142  K 6.3* 4.4 3.3* 3.8  CL 121* 122* 121* 116*  CO2 15* 13* 18* 20*  GLUCOSE 143* 143* 121* 96  BUN 74* 68* 59* 47*  CREATININE 3.61* 3.22* 2.71* 2.03*  CALCIUM 11.8* 10.8* 10.0 9.7  MG  --  1.3* 1.8 1.5*    Liver Function Tests: Recent Labs  Lab 04/10/23 1017 04/11/23 0424  AST 17 19  ALT 31 30  ALKPHOS 117 101  BILITOT 0.9 1.0  PROT 6.3* 5.6*  ALBUMIN 3.4* 2.9*   No results for input(s): "LIPASE", "AMYLASE" in the last 168 hours. No results for input(s): "AMMONIA" in the last 168 hours. Coagulation Profile: Recent Labs  Lab 04/10/23 1107  INR 1.2   CBC: Recent Labs  Lab 04/10/23 1017 04/11/23 0424 04/12/23 0523 04/13/23 0536  WBC 5.5 5.9 4.1 3.8*  NEUTROABS 4.2  --   --   --   HGB 11.7* 11.2* 10.2* 10.6*  HCT 35.1* 33.8* 30.4* 31.0*  MCV 95.9 97.1 96.5 93.9  PLT 169 154 138* 139*   Cardiac Enzymes: No results for input(s): "CKTOTAL", "CKMB", "CKMBINDEX", "TROPONINI" in the last 168 hours. BNP: Invalid input(s): "POCBNP" CBG: No results for input(s): "GLUCAP" in the last 168 hours. HbA1C: Recent Labs    04/10/23 1730  HGBA1C 5.1   Urine analysis:    Component Value Date/Time   COLORURINE YELLOW 04/10/2023 1043   APPEARANCEUR HAZY (A) 04/10/2023 1043   APPEARANCEUR Clear 12/21/2021  1054   LABSPEC 1.011 04/10/2023 1043   PHURINE 5.0 04/10/2023 1043   GLUCOSEU NEGATIVE 04/10/2023 1043   HGBUR MODERATE (A) 04/10/2023 1043   BILIRUBINUR NEGATIVE 04/10/2023 1043   BILIRUBINUR negative 04/25/2022 0857   BILIRUBINUR Negative 12/21/2021 1054   KETONESUR NEGATIVE 04/10/2023 1043   PROTEINUR 30 (A) 04/10/2023 1043   UROBILINOGEN 0.2 04/25/2022 0857   UROBILINOGEN 0.2 08/24/2013 1129   NITRITE NEGATIVE 04/10/2023 1043   LEUKOCYTESUR LARGE (A) 04/10/2023 1043   Sepsis Labs: @LABRCNTIP (procalcitonin:4,lacticidven:4) ) Recent Results (from the past 240 hour(s))  Urine Culture (for pregnant, neutropenic or urologic patients or patients with an indwelling urinary catheter)     Status: Abnormal   Collection Time: 04/10/23 10:43 AM   Specimen: Urine, Clean Catch  Result Value Ref Range Status   Specimen Description   Final    URINE, CLEAN CATCH Performed at  [facility], [address], [place], [place] [address]    Special Requests   Final    NONE Performed at [facility], [address] [place], [place] [address]    Culture MULTIPLE SPECIES PRESENT, SUGGEST RECOLLECTION (A)  Final   Report Status 04/11/2023 FINAL  Final  Blood Culture (routine x 2)     Status: None (Preliminary result)   Collection Time: 04/10/23 11:07 AM   Specimen: BLOOD  Result Value Ref Range Status   Specimen Description BLOOD RIGHT ANTECUBITAL  Final   Special Requests   Final    Blood Culture adequate volume BOTTLES DRAWN AEROBIC AND ANAEROBIC   Culture   Final    NO GROWTH 3 DAYS Performed at [facility], [address] [place], [place] [address]    Report Status PENDING  Incomplete  Blood Culture (routine x 2)     Status: None (Preliminary result)   Collection Time: 04/10/23 11:07 AM   Specimen: BLOOD  Result Value Ref Range Status   Specimen Description BLOOD BLOOD LEFT HAND  Final   Special Requests   Final    BOTTLES DRAWN AEROBIC AND ANAEROBIC Blood Culture adequate volume   Culture   Final    NO GROWTH 3 DAYS Performed at [facility], [address] [place], [place] [address]    Report Status PENDING  Incomplete  MRSA Next Gen by PCR, Nasal     Status: None   Collection Time: 04/10/23  4:04 PM   Specimen: Nasal Mucosa; Nasal Swab  Result Value Ref Range Status   MRSA by PCR Next Gen NOT DETECTED NOT DETECTED Final    Comment: (NOTE) The GeneXpert MRSA Assay (FDA approved for NASAL specimens only), is one component of a comprehensive MRSA colonization surveillance program. It is not intended to diagnose MRSA infection nor to guide or monitor treatment for MRSA infections. Test performance is not FDA approved in patients less than 73 years old. Performed at [facility], [address] [place], [place] [address]      Scheduled Meds:  Chlorhexidine Gluconate Cloth  6 each Topical Daily   heparin  5,000 Units Subcutaneous Q8H   lactulose   10 g Oral TID   levothyroxine  100 mcg Oral QAC breakfast   sertraline  50 mg Oral Daily   tamsulosin  0.4 mg Oral Daily   Continuous Infusions:  ceFEPime (MAXIPIME) IV Stopped (04/13/23 1247)   levETIRAcetam Stopped (04/13/23 1553)   magnesium sulfate bolus IVPB 50 mL/hr at 04/13/23 1700    Procedures/Studies: CT CHEST ABDOMEN PELVIS WO CONTRAST  Result Date: 04/10/2023 CLINICAL  DATA:  Known UTI with altered mental status EXAM: CT CHEST, ABDOMEN AND PELVIS WITHOUT CONTRAST TECHNIQUE: Multidetector CT imaging of the chest, abdomen and pelvis was performed following the standard protocol without IV contrast. RADIATION DOSE REDUCTION: This exam was performed according to the departmental dose-optimization program which includes automated exposure control, adjustment of the mA and/or kV according to patient size and/or use of iterative reconstruction technique. COMPARISON:  CTA chest dated 09/23/2022, CT cervical spine dated 09/14/2022, CT abdomen and pelvis dated 08/31/2004 FINDINGS: CT CHEST FINDINGS Cardiovascular: Normal heart size. No significant pericardial fluid/thickening. Great vessels are normal in course and caliber. Coronary artery calcifications. Mediastinum/Nodes: Imaged thyroid gland without nodules meeting criteria for imaging follow-up by size. Normal esophagus. No pathologically enlarged axillary, supraclavicular, mediastinal, or hilar lymph nodes. Lungs/Pleura: The central airways are patent. Left lower lobe subsegmental atelectasis. No pneumothorax. Trace left pleural effusion. Musculoskeletal: No acute or abnormal lytic or blastic osseous lesions. Multilevel degenerative changes of the thoracic spine. Increased anterior wedging of T1 with new grade 1 anterolisthesis at C7-T1 compared to 09/14/2022. Mandible with periapical lucency along the left frontal and lateral incisors. CT ABDOMEN PELVIS FINDINGS Hepatobiliary: Mildly nodular hepatic contour. No intra or extrahepatic biliary  ductal dilation. Normal gallbladder. Pancreas: No focal lesions or main ductal dilation. Spleen: Normal in size without focal abnormality. Adrenals/Urinary Tract: No adrenal nodules. No suspicious renal mass, calculi, or hydronephrosis. Circumferential mural thickening of the underdistended bladder with catheter in-situ. Stomach/Bowel: Normal appearance of the stomach. No evidence of bowel wall thickening, distention, or inflammatory changes. Colonic diverticulosis without acute diverticulitis. Normal appendix. Vascular/Lymphatic: Aortic atherosclerosis. No enlarged abdominal or pelvic lymph nodes. Reproductive: Prostate is unremarkable. Other: No free fluid, fluid collection, or free air. Musculoskeletal: No acute or abnormal lytic or blastic osseous findings. Degenerative changes of the bilateral hips. Multilevel degenerative changes of the lumbar spine. Fat containing lower midline anterior abdominal hernia. IMPRESSION: 1. Circumferential mural thickening of the underdistended bladder with catheter in-situ, which may be due to underdistention or cystitis. 2. Increased anterior wedging of T1 with new grade 1 anterolisthesis at C7-T1 compared to 09/14/2022. 3. Mildly nodular hepatic contour, which can be seen in the setting of cirrhosis. 4. Trace left pleural effusion. 5. Aortic Atherosclerosis (ICD10-I70.0). Coronary artery calcifications. Assessment for potential risk factor modification, dietary therapy or pharmacologic therapy may be warranted, if clinically indicated. Electronically Signed   By: Agustin Cree M.D.   On: 04/10/2023 13:36   DG Chest Port 1 View  Result Date: 04/10/2023 CLINICAL DATA:  Questionable sepsis.  Evaluate for abnormality. EXAM: PORTABLE CHEST 1 VIEW COMPARISON:  11/19/2022. FINDINGS: Left basilar atelectasis. No consolidation or pulmonary edema. Stable cardiac and mediastinal contours. No pleural effusion or pneumothorax. IMPRESSION: Left basilar atelectasis. No consolidation.  Electronically Signed   By: Orvan Falconer M.D.   On: 04/10/2023 11:17    Catarina Hartshorn, DO  [facility]  If 7PM-7AM, please contact night-coverage [email or web address] Password [email or web address] 04/13/2023, 5:06 PM   LOS: 3 days

## 2023-04-14 ENCOUNTER — Other Ambulatory Visit (HOSPITAL_COMMUNITY): Payer: Self-pay | Admitting: *Deleted

## 2023-04-14 ENCOUNTER — Inpatient Hospital Stay (HOSPITAL_COMMUNITY): Payer: Medicare Other

## 2023-04-14 DIAGNOSIS — A419 Sepsis, unspecified organism: Secondary | ICD-10-CM | POA: Diagnosis not present

## 2023-04-14 DIAGNOSIS — I471 Supraventricular tachycardia, unspecified: Secondary | ICD-10-CM | POA: Diagnosis not present

## 2023-04-14 DIAGNOSIS — I4719 Other supraventricular tachycardia: Secondary | ICD-10-CM | POA: Diagnosis not present

## 2023-04-14 DIAGNOSIS — I5022 Chronic systolic (congestive) heart failure: Secondary | ICD-10-CM

## 2023-04-14 DIAGNOSIS — N179 Acute kidney failure, unspecified: Secondary | ICD-10-CM | POA: Diagnosis not present

## 2023-04-14 DIAGNOSIS — G9341 Metabolic encephalopathy: Secondary | ICD-10-CM | POA: Diagnosis not present

## 2023-04-14 LAB — ECHOCARDIOGRAM COMPLETE
Area-P 1/2: 3.56 cm2
Height: 67 in
S' Lateral: 2.5 cm
Weight: 2303.37 oz

## 2023-04-14 LAB — CALCITRIOL (1,25 DI-OH VIT D): Vit D, 1,25-Dihydroxy: 13.2 pg/mL — ABNORMAL LOW (ref 24.8–81.5)

## 2023-04-14 LAB — BASIC METABOLIC PANEL
Anion gap: 7 (ref 5–15)
BUN: 41 mg/dL — ABNORMAL HIGH (ref 8–23)
CO2: 20 mmol/L — ABNORMAL LOW (ref 22–32)
Calcium: 9.6 mg/dL (ref 8.9–10.3)
Chloride: 118 mmol/L — ABNORMAL HIGH (ref 98–111)
Creatinine, Ser: 1.94 mg/dL — ABNORMAL HIGH (ref 0.61–1.24)
GFR, Estimated: 35 mL/min — ABNORMAL LOW (ref >60)
Glucose, Bld: 96 mg/dL (ref 70–99)
Potassium: 3.7 mmol/L (ref 3.5–5.1)
Sodium: 145 mmol/L (ref 135–145)

## 2023-04-14 LAB — CULTURE, BLOOD (ROUTINE X 2)
Culture: NO GROWTH
Special Requests: ADEQUATE

## 2023-04-14 LAB — MAGNESIUM: Magnesium: 2.4 mg/dL (ref 1.7–2.4)

## 2023-04-14 MED ORDER — DILTIAZEM HCL-DEXTROSE 125-5 MG/125ML-% IV SOLN (PREMIX)
5.0000 mg/h | INTRAVENOUS | Status: DC
  Administered 2023-04-14: 5 mg/h via INTRAVENOUS
  Filled 2023-04-14: qty 125

## 2023-04-14 MED ORDER — METOPROLOL TARTRATE 25 MG PO TABS
25.0000 mg | ORAL_TABLET | Freq: Four times a day (QID) | ORAL | Status: DC
  Administered 2023-04-14 – 2023-04-15 (×4): 25 mg via ORAL
  Filled 2023-04-14 (×4): qty 1

## 2023-04-14 MED ORDER — SODIUM CHLORIDE 0.45 % IV SOLN: INTRAVENOUS | Status: AC

## 2023-04-14 MED ORDER — METOPROLOL TARTRATE 5 MG/5ML IV SOLN: 2.5000 mg | Freq: Four times a day (QID) | INTRAVENOUS | Status: DC

## 2023-04-14 MED ORDER — DILTIAZEM LOAD VIA INFUSION
10.0000 mg | Freq: Once | INTRAVENOUS | Status: AC
  Administered 2023-04-14: 10 mg via INTRAVENOUS
  Filled 2023-04-14: qty 10

## 2023-04-14 MED ORDER — MAGNESIUM SULFATE 2 GM/50ML IV SOLN
2.0000 g | Freq: Once | INTRAVENOUS | Status: AC
  Administered 2023-04-14: 2 g via INTRAVENOUS

## 2023-04-14 MED ORDER — METOPROLOL TARTRATE 5 MG/5ML IV SOLN: 5.0000 mg | Freq: Four times a day (QID) | INTRAVENOUS | Status: DC

## 2023-04-14 MED ORDER — HALOPERIDOL LACTATE 5 MG/ML IJ SOLN
2.0000 mg | Freq: Once | INTRAMUSCULAR | Status: AC
  Administered 2023-04-14: 2 mg via INTRAMUSCULAR
  Filled 2023-04-14: qty 1

## 2023-04-14 MED ORDER — DILTIAZEM LOAD VIA INFUSION: 10.0000 mg | Freq: Once | INTRAVENOUS | Status: DC

## 2023-04-14 NOTE — Progress Notes (Signed)
Speech Language Pathology Treatment: Dysphagia  Patient Details Name: WILBERTO CONSOLE MRN: [id number] DOB: 1946-03-24 Today's Date: 04/14/2023 Time: 1640-1700 SLP Time Calculation (min) (ACUTE ONLY): 20 min  Assessment / Plan / Recommendation Clinical Impression  Pt seen for ongoing dysphagia intervention. He is more alert and oriented to task than when previously seen. Pt able to self feed regular textures and thin liquids via cup/straw with initial cue to avoid talking while eating. Pt without overt signs or symptoms of aspiration, mildly prolonged oral transit with solids. Recommend regular textures and thin liquids with standard aspiration and reflux precautions. OK for PO medications whole with water or in puree if larger pills. No further SLP services indicated at this time. SLP will sign off.    HPI HPI: AVANISH CERULLO is a 77 year old male with a history of combined CHF (prior EF 15-20%), NASH, diabetes mellitus type 2, hypertension, hypothyroidism, venous stasis dermatitis presenting from [facility] with altered mental status.  Apparently, the patient has been getting more confused since 7024.  A UA was obtained at that time and suggested possible UTI.  The patient was started on cephalexin at his facility.  Unfortunately, the patient's confusion continue to worsen.  At baseline, the patient gets around on a wheelchair up, but is able to make transfers.  However in the last 24 hours, he has not been able to get out of bed, and he has been less responsive.  As result, the patient was brought to the emergency department for further evaluation and treatment.  At the time of my evaluation, the patient is not able to provide a significant history.  He is able to answer some simple yes/no questions.  History is obtained from review of medical record and speaking with EDP.  At baseline, the patient is pleasantly confused but he is able to carry on a conversation.  At the time of my evaluation, the  patient denies chest pain, shortness breath, abdominal pain, headache.  Remainder review of systems is unobtainable secondary the patient 's encephalopathy.      SLP Plan  All goals met;Discharge SLP treatment due to (comment)      Recommendations for follow up therapy are one component of a multi-disciplinary discharge planning process, led by the attending physician.  Recommendations may be updated based on patient status, additional functional criteria and insurance authorization.    Recommendations  Diet recommendations: Regular;Thin liquid Liquids provided via: Straw;Cup Medication Administration: Whole meds with puree Supervision: Patient able to self feed;Intermittent supervision to cue for compensatory strategies Compensations: Minimize environmental distractions;Slow rate;Small sips/bites Postural Changes and/or Swallow Maneuvers: Seated upright 90 degrees;Upright 30-60 min after meal                  Oral care BID;Staff/trained caregiver to provide oral care   None Dysphagia, unspecified (R13.10)     All goals met;Discharge SLP treatment due to (comment)    Thank you,  Havery Moros, CCC-SLP [phone]  Maitland Muhlbauer  04/14/2023, 5:00 PM

## 2023-04-14 NOTE — Progress Notes (Addendum)
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  Apparently, the patient has been getting more confused since 04/08/23.  A UA was obtained at that time and suggested possible UTI.  The patient was started on cephalexin at his facility.  Unfortunately, the patient's confusion continue to worsen.  At baseline, the patient gets around on a wheelchair up, but is able to make transfers.  However in the last 24 hours, he has not been able to get out of bed, and he has been less responsive.  As result, the patient was brought to the emergency department for further evaluation and treatment.  At the time of my evaluation, the patient is not able to provide a significant history.  He is able to answer some simple yes/no questions.  History is obtained from review of medical record and speaking with EDP.  At baseline, the patient is pleasantly confused but he is able to carry on a conversation.  At the time of my evaluation, the patient denies chest pain, shortness breath, abdominal pain, headache.  Remainder review of systems is unobtainable secondary the patient 's encephalopathy. In the ED, the patient had low-grade temperature 99.5 F.  He was tachycardic up to the 130s.  The patient was initially hypotensive with a blood pressure of 80/58.  The patient was fluid resuscitated and given 2 L of LR.  He was started on cefepime and vancomycin.  CT of the chest, abdomen, and pelvis was negative for any infiltrates or edema.  There was circumferential mural thickening of his bladder.  A Foley catheter was pleasant.  There was no abnormal lytic or blastic lesions of his thoracic spine.  There was LLL subsegmental atelectasis.  There  is a nodular hepatic contour.  WBC 5.5, hemoglobin 1.7, platelets 269,000.  LFTs unremarkable.  Albumin 3.4, calcium 11.8 with the corrected calcium of 12.2.  Sodium 143, potassium 6.3, bicarbonate 15, serum creatinine 3.61.  The patient was given temporizing measures for his hyperkalemia.  Lactic acid 1.1>> 1.6.  UA showed>50 WBC.  EKG shows sinus tachycardia with nonspecific T wave changes.   Assessment/Plan:  Sepsis -Present on admission -Secondary to urine source -UA>50 WBC -Chest x-ray negative for infiltrates -CT chest, A/P--negative for any acute findings except increased anterior wedging of T1 with new grade 1 anterolisthesis C7-T1 -Follow blood cultures and urine cultures -Initially empiric vanc and  cefepime -d/c vanc -now on ceftriaxone -sepsis physiology resoved   UTI -continue empiric vanc and cefepime pending culture data -Foley catheter was placed in the ED with 350 cc out -urine culture--polymicrobial -d/c vanc -change to ceftriaxone  SVT/Atrial tachycardia -start cardizem drip -Echo   Acute on chronic renal failure-CKD 3a -Baseline creatinine 1.0-1.3 -Presented with serum creatinine 3.61 -Secondary to volume depletion and hemodynamic changes -Continue IV fluids and trend BMP -Temporarily holding Bumex   Acute metabolic encephalopathy -Secondary to infectious process and hypercalcemia and AKI -pt has cognitive impairment at baseline -7/12--some improvement, but remains confused -7/13--continues to improve -7/14--mental status back to baseline   Hypercalcemia -check intact PTH--12 -25 vitamin D--34.99 -PTh-rp pending -continue IVF>>improving -1, 25 vitamin D -SPEP   Hyperkalemia -anticipate improvement with IVF -temporizing measures given -lokelma x 1 -improved   NAGMA -  started on IV bicarb>>d/c   Chronic HFimprEF -09/20/2019 echo EF 15-20%, AK of the distal one third LV -09/23/2022 echo EF 50 to 55%, grade 2 DD, trivial MR -Holding Entresto  secondary to AKI -Temporarily holding Bumex -Holding carvedilol secondary to hypotension   Hypothyroidism -Continue Synthroid   Diabetes mellitus type 2 -Holding metformin -NovoLog sliding scale -06/20/2022 hemoglobin A1c 6.3 -04/10/23 A1C--5.1   Hyperlipidemia -Continue statin   Diabetic polyneuropathy -Holding gabapentin in the setting of myoclonus with AKI  Hypomagnesemia/Hypokalemia -replete           Family Communication:   niece updated 7/14   Consultants:  none   Code Status:   DNR   DVT Prophylaxis:  [place] Heparin      Procedures: As Listed in Progress Note Above   Antibiotics: Vanc 7/11>> Cefepime 7/11>>1/13                    Subjective: Patient denies fevers, chills, headache, chest pain, dyspnea, nausea, vomiting, diarrhea, abdominal pain, dysuria, hematuria, hematochezia, and melena.   Objective: Vitals:   04/14/23 0700 04/14/23 0731 04/14/23 0800 04/14/23 0822  BP: (!) 149/91  (!) 163/75   Pulse: (!) 122   (!) 138  Resp: 19   (!) 25  Temp:  98.1 F (36.7 C)    TempSrc:  Oral    SpO2: 100%  100% 100%  Weight:      Height:        Intake/Output Summary (Last 24 hours) at 04/14/2023 0831 Last data filed at 04/14/2023 0804 Gross per 24 hour  Intake 1380.32 ml  Output 700 ml  Net 680.32 ml   Weight change:  Exam:  General:  Pt is alert, follows commands appropriately, not in acute distress HEENT: No icterus, No thrush, No neck mass, [place]/AT Cardiovascular: RRR, S1/S2, no rubs, no gallops Respiratory: bibasilar crackles. No wheeze Abdomen: Soft/+BS, non tender, non distended, no guarding Extremities: trace edema, No lymphangitis, No petechiae, No rashes, no synovitis   Data Reviewed: I have personally reviewed following labs and imaging studies Basic Metabolic Panel: Recent Labs  Lab 04/10/23 1017 04/11/23 0424 04/12/23 0523 04/13/23 0536 04/14/23 0355 04/14/23 0422  NA 143 143 146* 142 145  --   K 6.3* 4.4 3.3* 3.8  3.7  --   CL 121* 122* 121* 116* 118*  --   CO2 15* 13* 18* 20* 20*  --   GLUCOSE 143* 143* 121* 96 96  --   BUN 74* 68* 59* 47* 41*  --   CREATININE 3.61* 3.22* 2.71* 2.03* 1.94*  --   CALCIUM 11.8* 10.8* 10.0 9.7 9.6  --   MG  --  1.3* 1.8 1.5*  --  2.4   Liver Function Tests: Recent Labs  Lab 04/10/23 1017 04/11/23 0424  AST 17 19  ALT 31 30  ALKPHOS 117 101  BILITOT 0.9 1.0  PROT 6.3* 5.6*  ALBUMIN 3.4* 2.9*   No results for input(s): "LIPASE", "AMYLASE" in the last 168 hours. No results for input(s): "AMMONIA" in the last 168 hours. Coagulation Profile: Recent Labs  Lab 04/10/23 1107  INR 1.2   CBC: Recent Labs  Lab 04/10/23 1017 04/11/23 0424 04/12/23 0523 04/13/23 0536  WBC 5.5 5.9 4.1 3.8*  NEUTROABS 4.2  --   --   --   HGB 11.7* 11.2* 10.2* 10.6*  HCT 35.1* 33.8* 30.4* 31.0*  MCV 95.9 97.1 96.5 93.9  PLT 169 154 138* 139*   Cardiac Enzymes: No  results for input(s): "CKTOTAL", "CKMB", "CKMBINDEX", "TROPONINI" in the last 168 hours. BNP: Invalid input(s): "POCBNP" CBG: No results for input(s): "GLUCAP" in the last 168 hours. HbA1C: No results for input(s): "HGBA1C" in the last 72 hours. Urine analysis:    Component Value Date/Time   COLORURINE YELLOW 04/10/2023 1043   APPEARANCEUR HAZY (A) 04/10/2023 1043   APPEARANCEUR Clear 12/21/2021 1054   LABSPEC 1.011 04/10/2023 1043   PHURINE 5.0 04/10/2023 1043   GLUCOSEU NEGATIVE 04/10/2023 1043   HGBUR MODERATE (A) 04/10/2023 1043   BILIRUBINUR NEGATIVE 04/10/2023 1043   BILIRUBINUR negative 04/25/2022 0857   BILIRUBINUR Negative 12/21/2021 1054   KETONESUR NEGATIVE 04/10/2023 1043   PROTEINUR 30 (A) 04/10/2023 1043   UROBILINOGEN 0.2 04/25/2022 0857   UROBILINOGEN 0.2 08/24/2013 1129   NITRITE NEGATIVE 04/10/2023 1043   LEUKOCYTESUR LARGE (A) 04/10/2023 1043   Sepsis Labs: @LABRCNTIP (procalcitonin:4,lacticidven:4) ) Recent Results (from the past 240 hour(s))  Urine Culture (for pregnant,  neutropenic or urologic patients or patients with an indwelling urinary catheter)     Status: Abnormal   Collection Time: 04/10/23 10:43 AM   Specimen: Urine, Clean Catch  Result Value Ref Range Status   Specimen Description   Final    URINE, CLEAN CATCH Performed at [facility], [address], [place], [place] [address]    Special Requests   Final    NONE Performed at [facility], [address], [place], [place] [address]    Culture MULTIPLE SPECIES PRESENT, SUGGEST RECOLLECTION (A)  Final   Report Status 04/11/2023 FINAL  Final  Blood Culture (routine x 2)     Status: None (Preliminary result)   Collection Time: 04/10/23 11:07 AM   Specimen: BLOOD  Result Value Ref Range Status   Specimen Description BLOOD RIGHT ANTECUBITAL  Final   Special Requests   Final    Blood Culture adequate volume BOTTLES DRAWN AEROBIC AND ANAEROBIC   Culture   Final    NO GROWTH 3 DAYS Performed at [facility], [address], [place], [place] [address]    Report Status PENDING  Incomplete  Blood Culture (routine x 2)     Status: None (Preliminary result)   Collection Time: 04/10/23 11:07 AM   Specimen: BLOOD  Result Value Ref Range Status   Specimen Description BLOOD BLOOD LEFT HAND  Final   Special Requests   Final    BOTTLES DRAWN AEROBIC AND ANAEROBIC Blood Culture adequate volume   Culture   Final    NO GROWTH 3 DAYS Performed at [facility], [address] [place], [place] [address]    Report Status PENDING  Incomplete  MRSA Next Gen by PCR, Nasal     Status: None   Collection Time: 04/10/23  4:04 PM   Specimen: Nasal Mucosa; Nasal Swab  Result Value Ref Range Status   MRSA by PCR Next Gen NOT DETECTED NOT DETECTED Final    Comment: (NOTE) The GeneXpert MRSA Assay (FDA approved for NASAL specimens only), is one component of a comprehensive MRSA colonization surveillance program. It is not intended to diagnose MRSA infection nor to guide or monitor treatment for MRSA  infections. Test performance is not FDA approved in patients less than 80 years old. Performed at [facility], [address], [place], [place] [address]      Scheduled Meds:  Chlorhexidine Gluconate Cloth  6 each Topical Daily   diltiazem  10 mg Intravenous Once   heparin  5,000 Units Subcutaneous Q8H   lactulose  10 g Oral TID   levothyroxine  100 mcg Oral QAC breakfast   sertraline  50 mg Oral Daily   tamsulosin  0.4 mg Oral Daily   Continuous Infusions:  cefTRIAXone (ROCEPHIN)  IV Stopped (04/14/23 0101)   diltiazem (CARDIZEM) infusion     levETIRAcetam Stopped (04/14/23 0629)    Procedures/Studies: CT CHEST ABDOMEN PELVIS WO CONTRAST  Result Date: 04/10/2023 CLINICAL DATA:  Known UTI with altered mental status EXAM: CT CHEST, ABDOMEN AND PELVIS WITHOUT CONTRAST TECHNIQUE: Multidetector CT imaging of the chest, abdomen and pelvis was performed following the standard protocol without IV contrast. RADIATION DOSE REDUCTION: This exam was performed according to the departmental dose-optimization program which includes automated exposure control, adjustment of the mA and/or kV according to patient size and/or use of iterative reconstruction technique. COMPARISON:  CTA chest dated 09/23/2022, CT cervical spine dated 09/14/2022, CT abdomen and pelvis dated 08/31/2004 FINDINGS: CT CHEST FINDINGS Cardiovascular: Normal heart size. No significant pericardial fluid/thickening. Great vessels are normal in course and caliber. Coronary artery calcifications. Mediastinum/Nodes: Imaged thyroid gland without nodules meeting criteria for imaging follow-up by size. Normal esophagus. No pathologically enlarged axillary, supraclavicular, mediastinal, or hilar lymph nodes. Lungs/Pleura: The central airways are patent. Left lower lobe subsegmental atelectasis. No pneumothorax. Trace left pleural effusion. Musculoskeletal: No acute or abnormal lytic or blastic osseous lesions. Multilevel degenerative changes of  the thoracic spine. Increased anterior wedging of T1 with new grade 1 anterolisthesis at C7-T1 compared to 09/14/2022. Mandible with periapical lucency along the left frontal and lateral incisors. CT ABDOMEN PELVIS FINDINGS Hepatobiliary: Mildly nodular hepatic contour. No intra or extrahepatic biliary ductal dilation. Normal gallbladder. Pancreas: No focal lesions or main ductal dilation. Spleen: Normal in size without focal abnormality. Adrenals/Urinary Tract: No adrenal nodules. No suspicious renal mass, calculi, or hydronephrosis. Circumferential mural thickening of the underdistended bladder with catheter in-situ. Stomach/Bowel: Normal appearance of the stomach. No evidence of bowel wall thickening, distention, or inflammatory changes. Colonic diverticulosis without acute diverticulitis. Normal appendix. Vascular/Lymphatic: Aortic atherosclerosis. No enlarged abdominal or pelvic lymph nodes. Reproductive: Prostate is unremarkable. Other: No free fluid, fluid collection, or free air. Musculoskeletal: No acute or abnormal lytic or blastic osseous findings. Degenerative changes of the bilateral hips. Multilevel degenerative changes of the lumbar spine. Fat containing lower midline anterior abdominal hernia. IMPRESSION: 1. Circumferential mural thickening of the underdistended bladder with catheter in-situ, which may be due to underdistention or cystitis. 2. Increased anterior wedging of T1 with new grade 1 anterolisthesis at C7-T1 compared to 09/14/2022. 3. Mildly nodular hepatic contour, which can be seen in the setting of cirrhosis. 4. Trace left pleural effusion. 5. Aortic Atherosclerosis (ICD10-I70.0). Coronary artery calcifications. Assessment for potential risk factor modification, dietary therapy or pharmacologic therapy may be warranted, if clinically indicated. Electronically Signed   By: Agustin Cree M.D.   On: 04/10/2023 13:36   DG Chest Port 1 View  Result Date: 04/10/2023 CLINICAL DATA:  Questionable  sepsis.  Evaluate for abnormality. EXAM: PORTABLE CHEST 1 VIEW COMPARISON:  11/19/2022. FINDINGS: Left basilar atelectasis. No consolidation or pulmonary edema. Stable cardiac and mediastinal contours. No pleural effusion or pneumothorax. IMPRESSION: Left basilar atelectasis. No consolidation. Electronically Signed   By: Orvan Falconer M.D.   On: 04/10/2023 11:17    Catarina Hartshorn, DO  [facility]  If 7PM-7AM, please contact night-coverage [email or web address] Password [facility] 04/14/2023, 8:31 AM   LOS: 4 days

## 2023-04-14 NOTE — Progress Notes (Signed)
*  PRELIMINARY RESULTS* Echocardiogram 2D Echocardiogram has been performed.  Stacey Drain 04/14/2023, 11:37 AM

## 2023-04-14 NOTE — TOC Progression Note (Signed)
Transition of Care [facility]) - Progression Note    Patient Details  Name: Michael [name] MRN: [id number] Date of Birth: [birth year]  Transition of Care [facility]) CM/SW Contact  Karn [place], [place] Phone Number: 04/14/2023, 10:28 AM  Clinical Narrative: Alexander [facility] updated Olegario Messier at [facility] on pt. Aware of possible d/c tomorrow. TOC will follow.       Expected Discharge Plan: Long Term Nursing Home ([facility]) Barriers to Discharge: Continued Medical Work up  Expected Discharge Plan and Services In-house Referral: Clinical Social Work     Living arrangements for the past 2 months: Assisted Living Facility                                       Social Determinants of Health (SDOH) Interventions SDOH Screenings   Food Insecurity: No Food Insecurity (09/19/2022)  Housing: Low Risk  (09/19/2022)  Transportation Needs: No Transportation Needs (09/19/2022)  Utilities: Not At Risk (09/19/2022)  Alcohol Screen: Low Risk  (04/18/2022)  Depression (PHQ2-9): Low Risk  (06/20/2022)  Financial Resource Strain: Low Risk  (04/18/2022)  Physical Activity: Inactive (04/18/2022)  Social Connections: Moderately Isolated (04/18/2022)  Stress: No Stress Concern Present (04/18/2022)  Tobacco Use: Medium Risk (04/10/2023)    Readmission Risk Interventions    04/12/2023    5:39 PM 04/11/2023    4:24 PM 11/19/2022   11:00 AM  Readmission Risk Prevention Plan  Transportation Screening  Complete Complete  PCP or Specialist Appt within 3-5 Days  Complete   HRI or Home Care Consult -- Not Complete   Social Work Consult for Recovery Care Planning/Counseling Complete Complete   Palliative Care Screening Complete Not Applicable   Medication Review Oceanographer) Complete Complete Complete  HRI or Home Care Consult   Complete  SW Recovery Care/Counseling Consult   Complete  Palliative Care Screening   Not Applicable  Skilled Nursing Facility   Not Applicable

## 2023-04-15 DIAGNOSIS — G9341 Metabolic encephalopathy: Secondary | ICD-10-CM | POA: Diagnosis not present

## 2023-04-15 DIAGNOSIS — A419 Sepsis, unspecified organism: Secondary | ICD-10-CM | POA: Diagnosis not present

## 2023-04-15 DIAGNOSIS — N179 Acute kidney failure, unspecified: Secondary | ICD-10-CM | POA: Diagnosis not present

## 2023-04-15 DIAGNOSIS — I5022 Chronic systolic (congestive) heart failure: Secondary | ICD-10-CM | POA: Diagnosis not present

## 2023-04-15 LAB — BASIC METABOLIC PANEL
Anion gap: 7 (ref 5–15)
BUN: 35 mg/dL — ABNORMAL HIGH (ref 8–23)
CO2: 18 mmol/L — ABNORMAL LOW (ref 22–32)
Calcium: 8.8 mg/dL — ABNORMAL LOW (ref 8.9–10.3)
Chloride: 114 mmol/L — ABNORMAL HIGH (ref 98–111)
Creatinine, Ser: 1.83 mg/dL — ABNORMAL HIGH (ref 0.61–1.24)
GFR, Estimated: 38 mL/min — ABNORMAL LOW (ref >60)
Glucose, Bld: 158 mg/dL — ABNORMAL HIGH (ref 70–99)
Potassium: 3.4 mmol/L — ABNORMAL LOW (ref 3.5–5.1)
Sodium: 139 mmol/L (ref 135–145)

## 2023-04-15 LAB — CBC
HCT: 31.4 % — ABNORMAL LOW (ref 39.0–52.0)
Hemoglobin: 10.8 g/dL — ABNORMAL LOW (ref 13.0–17.0)
MCH: 32.2 pg (ref 26.0–34.0)
MCHC: 34.4 g/dL (ref 30.0–36.0)
MCV: 93.7 fL (ref 80.0–100.0)
Platelets: 154 10*3/uL (ref 150–400)
RBC: 3.35 MIL/uL — ABNORMAL LOW (ref 4.22–5.81)
RDW: 13.5 % (ref 11.5–15.5)
WBC: 4.7 10*3/uL (ref 4.0–10.5)
nRBC: 0 % (ref 0.0–0.2)

## 2023-04-15 LAB — CULTURE, BLOOD (ROUTINE X 2)
Culture: NO GROWTH
Special Requests: ADEQUATE

## 2023-04-15 LAB — T4, FREE: Free T4: 1.18 ng/dL — ABNORMAL HIGH (ref 0.61–1.12)

## 2023-04-15 MED ORDER — CEFDINIR 300 MG PO CAPS: 300.0000 mg | ORAL_CAPSULE | Freq: Two times a day (BID) | ORAL | 0 refills | Status: DC

## 2023-04-15 MED ORDER — LEVETIRACETAM 500 MG PO TABS: 500.0000 mg | ORAL_TABLET | Freq: Two times a day (BID) | ORAL | Status: AC

## 2023-04-15 MED ORDER — ORAL CARE MOUTH RINSE
15.0000 mL | OROMUCOSAL | Status: DC
  Administered 2023-04-15: 15 mL via OROMUCOSAL

## 2023-04-15 MED ORDER — METOPROLOL TARTRATE 25 MG PO TABS
25.0000 mg | ORAL_TABLET | Freq: Two times a day (BID) | ORAL | Status: DC
  Administered 2023-04-15: 25 mg via ORAL
  Filled 2023-04-15: qty 1

## 2023-04-15 MED ORDER — ORAL CARE MOUTH RINSE: 15.0000 mL | OROMUCOSAL | Status: DC | PRN

## 2023-04-15 MED ORDER — METOPROLOL TARTRATE 25 MG PO TABS: 25.0000 mg | ORAL_TABLET | Freq: Two times a day (BID) | ORAL | 1 refills | Status: AC

## 2023-04-15 MED ORDER — CEFDINIR 300 MG PO CAPS: 300.0000 mg | ORAL_CAPSULE | Freq: Two times a day (BID) | ORAL | Status: DC

## 2023-04-15 NOTE — Progress Notes (Signed)
Nsg Discharge Note  Admit Date:  04/10/2023 Discharge date: 04/15/2023   Michael [name] to be D/C'd  [facility] [place]  per MD order.  AVS completed.  Copy for chart, and copy for patient signed, and dated. Patient/caregiver able to verbalize understanding.  Discharge Medication: Allergies as of 04/15/2023   No Known Allergies      Medication List     STOP taking these medications    carvedilol 12.5 MG tablet Commonly known as: COREG   carvedilol 25 MG tablet Commonly known as: COREG   insulin glargine-yfgn 100 UNIT/ML Pen Commonly known as: SEMGLEE   Lantus SoloStar 100 UNIT/ML Solostar Pen Generic drug: insulin glargine   loperamide 2 MG tablet Commonly known as: IMODIUM A-D   metFORMIN 1000 MG tablet Commonly known as: GLUCOPHAGE   pravastatin 40 MG tablet Commonly known as: PRAVACHOL   sodium chloride 1 g tablet   sulfamethoxazole-trimethoprim 800-160 MG tablet Commonly known as: BACTRIM DS       TAKE these medications    acetaminophen 325 MG tablet Commonly known as: Tylenol Take 2 tablets (650 mg total) by mouth every 6 (six) hours as needed.   allopurinol 100 MG tablet Commonly known as: ZYLOPRIM Take 100 mg by mouth daily.   bumetanide 1 MG tablet Commonly known as: BUMEX Take 1 mg by mouth See admin instructions. Take 3 tablets (3mg ) by mouth daily   cefdinir 300 MG capsule Commonly known as: OMNICEF Take 1 capsule (300 mg total) by mouth every 12 (twelve) hours. X 2 days   chlorhexidine 0.12 % solution Commonly known as: PERIDEX Use as directed 15 mLs in the mouth or throat 2 (two) times daily.   Entresto 49-51 MG Generic drug: sacubitril-valsartan Take 1 tablet by mouth 2 (two) times daily.   gabapentin 400 MG capsule Commonly known as: NEURONTIN Take 1 capsule (400 mg total) by mouth 3 (three) times daily.   lactulose 10 GM/15ML solution Commonly known as: CHRONULAC Take 10 g by mouth 3 (three) times daily.    levETIRAcetam 500 MG tablet Commonly known as: KEPPRA Take 1 tablet (500 mg total) by mouth 2 (two) times daily. What changed:  medication strength how much to take   levothyroxine 100 MCG tablet Commonly known as: SYNTHROID Take 100 mcg by mouth daily before breakfast.   metolazone 2.5 MG tablet Commonly known as: ZAROXOLYN Two Times Weekly on Monday and Thursday What changed:  how much to take how to take this when to take this additional instructions   metoprolol tartrate 25 MG tablet Commonly known as: LOPRESSOR Take 1 tablet (25 mg total) by mouth 2 (two) times daily.   morphine CONCENTRATE 10 mg / 0.5 ml concentrated solution Take 5 mg by mouth every 2 (two) hours as needed for moderate pain.   pantoprazole 40 MG tablet Commonly known as: PROTONIX Take 1 tablet (40 mg total) by mouth daily.   Pataday 0.1 % ophthalmic solution Generic drug: olopatadine Place 1 drop into both eyes 2 (two) times daily.   polyethylene glycol powder 17 GM/SCOOP powder Commonly known as: MiraLax Take 17 g by mouth 2 (two) times daily as needed for moderate constipation or mild constipation.   potassium chloride 10 MEQ tablet Commonly known as: KLOR-CON Take 10 mEq by mouth 2 (two) times daily.   sertraline 50 MG tablet Commonly known as: ZOLOFT Take 50 mg by mouth daily.   tamsulosin 0.4 MG Caps capsule Commonly known as: FLOMAX TAKE (1) CAPSULE BY  MOUTH EVERY DAY. What changed: See the new instructions.   traZODone 50 MG tablet Commonly known as: DESYREL Take 1 tablet (50 mg total) by mouth at bedtime. TAKE 1 TABLET BY MOUTH AT BEDTIME AS NEEDED FOR SLEEP. What changed:  how much to take additional instructions        Discharge Assessment: Vitals:   04/15/23 1210 04/15/23 1300  BP: 119/64 126/69  Pulse:    Resp: (!) 21 (!) 22  Temp:    SpO2:     Skin clean, dry and intact without evidence of skin break down, no evidence of skin tears noted. IV catheter  discontinued intact. Site without signs and symptoms of complications - no redness or edema noted at insertion site, patient denies c/o pain - only slight tenderness at site.  Dressing with slight pressure applied.  D/c Instructions-Education: Discharge instructions given to patient/family with verbalized understanding. D/c education completed with patient/family including follow up instructions, medication list, d/c activities limitations if indicated, with other d/c instructions as indicated by MD - patient able to verbalize understanding, all questions fully answered. Patient instructed to return to ED, call 911, or call MD for any changes in condition.  Patient escorted via EMS.   [name], RN 04/15/2023 1:36 PM

## 2023-04-15 NOTE — Plan of Care (Signed)
  Problem: Education: Goal: Knowledge of General Education information will improve Description: Including pain rating scale, medication(s)/side effects and non-pharmacologic comfort measures Outcome: Not Progressing   Problem: Health Behavior/Discharge Planning: Goal: Ability to manage health-related needs will improve Outcome: Not Progressing   Problem: Clinical Measurements: Goal: Ability to maintain clinical measurements within normal limits will improve Outcome: Progressing Goal: Will remain free from infection Outcome: Progressing Goal: Diagnostic test results will improve Outcome: Progressing Goal: Respiratory complications will improve Outcome: Progressing Goal: Cardiovascular complication will be avoided Outcome: Progressing   Problem: Activity: Goal: Risk for activity intolerance will decrease Outcome: Not Progressing   Problem: Nutrition: Goal: Adequate nutrition will be maintained Outcome: Progressing   Problem: Coping: Goal: Level of anxiety will decrease Outcome: Progressing   Problem: Elimination: Goal: Will not experience complications related to bowel motility Outcome: Not Progressing Goal: Will not experience complications related to urinary retention Outcome: Not Progressing   Problem: Pain Managment: Goal: General experience of comfort will improve Outcome: Progressing   Problem: Safety: Goal: Ability to remain free from injury will improve Outcome: Progressing   Problem: Skin Integrity: Goal: Risk for impaired skin integrity will decrease Outcome: Progressing   Problem: Safety: Goal: Non-violent Restraint(s) Outcome: Progressing

## 2023-04-15 NOTE — Progress Notes (Signed)
Pt has pulled out one of his IV's this morning, continues to have intermittent confusion with pulling off his leads and hollering for a lady. Re-orienting patient often and providing emotional support. Will continue to monitor.

## 2023-04-15 NOTE — Progress Notes (Signed)
EMS called for transport of patient.

## 2023-04-15 NOTE — Discharge Summary (Signed)
Physician Discharge Summary   Patient: Michael [name] MRN: [id number] DOB: Nov 07, 1945  Admit date:     04/10/2023  Discharge date: 04/15/23  Discharge Physician: Onalee Hua Kaytlen Lightsey   PCP: [facility]   Recommendations at discharge:   Please follow up with primary care provider within 1-2 weeks  Please repeat BMP and CBC in one week     Hospital Course: 77 year old male with a history of combined CHF (prior EF 15-20%), NASH, diabetes mellitus type 2, hypertension, hypothyroidism, venous stasis dermatitis presenting from [facility] with altered mental status.  Apparently, the patient has been getting more confused since 04/08/23.  A UA was obtained at that time and suggested possible UTI.  The patient was started on cephalexin at his facility.  Unfortunately, the patient's confusion continue to worsen.  At baseline, the patient gets around on a wheelchair up, but is able to make transfers.  However in the last 24 hours, he has not been able to get out of bed, and he has been less responsive.  As result, the patient was brought to the emergency department for further evaluation and treatment.  At the time of my evaluation, the patient is not able to provide a significant history.  He is able to answer some simple yes/no questions.  History is obtained from review of medical record and speaking with EDP.  At baseline, the patient is pleasantly confused but he is able to carry on a conversation.  At the time of my evaluation, the patient denies chest pain, shortness breath, abdominal pain, headache.  Remainder review of systems is unobtainable secondary the patient 's encephalopathy. In the ED, the patient had low-grade temperature 99.5 F.  He was tachycardic up to the 130s.  The patient was initially hypotensive with a blood pressure of 80/58.  The patient was fluid resuscitated and given 2 L of LR.  He was started on cefepime and vancomycin.  CT of the chest, abdomen, and pelvis was negative for  any infiltrates or edema.  There was circumferential mural thickening of his bladder.  A Foley catheter was pleasant.  There was no abnormal lytic or blastic lesions of his thoracic spine.  There was LLL subsegmental atelectasis.  There is a nodular hepatic contour.  WBC 5.5, hemoglobin 1.7, platelets 269,000.  LFTs unremarkable.  Albumin 3.4, calcium 11.8 with the corrected calcium of 12.2.  Sodium 143, potassium 6.3, bicarbonate 15, serum creatinine 3.61.  The patient was given temporizing measures for his hyperkalemia.  Lactic acid 1.1>> 1.6.  UA showed>50 WBC.  EKG shows sinus tachycardia with nonspecific T wave changes. The patient improved clinically with resolution of his sepsis physiology.  The patient remained intermittently confused.  However his mental status returned to essentially his baseline.  This was confirmed with the patient's niece.  At baseline, the patient is able to make transfers to wheelchair.  As such, he will return to [facility].  The patient's blood cultures remain negative.  Urine cultures were polymicrobial.  Nevertheless, the patient was continued on ceftriaxone with ceftriaxone improvement.  He was discharged with 2 additional days of cefdinir.  Assessment and Plan:  Sepsis -Present on admission -Secondary to urine source -UA>50 WBC -Chest x-ray negative for infiltrates -CT chest, A/P--negative for any acute findings except increased anterior wedging of T1 with new grade 1 anterolisthesis C7-T1 -Follow blood cultures and urine cultures -Initially empiric vanc and  cefepime -d/c vanc -now on ceftriaxone -sepsis physiology resoved   UTI -continue empiric vanc  and cefepime pending culture data -Foley catheter was placed in the ED with 350 cc out -urine culture--polymicrobial -d/c vanc -changed to ceftriaxone -Discharged with cefdinir x 2 more days.   SVT/Atrial tachycardia -started cardizem drip initially -Oral metoprolol started -Weaned off of Cardizem  drip -Return to baseline sinus rhythm -Echo--EF 55-60%.  No WMA, normal RVEF -Discharged with metoprolol tartrate 25 mg twice daily   Acute on chronic renal failure-CKD 3a -Baseline creatinine 1.0-1.4 -Presented with serum creatinine 3.61 -Secondary to volume depletion and hemodynamic changes -Continue IV fluids and trend BMP -Temporarily holding Bumex>> restart after discharge -serum creatinine 1.83 on day of d/c -repeat CMP one week after d/c   Acute metabolic encephalopathy -Secondary to infectious process and hypercalcemia and AKI -pt has cognitive impairment at baseline -7/12--some improvement, but remains confused -7/13--continues to improve -7/14--mental status back to baseline   Hypercalcemia -check intact PTH--12 -25 vitamin D--34.99 -PTh-rp pending -continue IVF>>improving -1, 25 vitamin D--13.2 -SPEP--results pending -Suspect due to immobility   Hyperkalemia -anticipate improvement with IVF -temporizing measures given -lokelma x 1 -improved   NAGMA -started on IV bicarb>>d/c   Chronic HFimprEF -09/20/2019 echo EF 15-20%, AK of the distal one third LV -09/23/2022 echo EF 50 to 55%, grade 2 DD, trivial MR -Holding Entresto secondary to AKI>>restart after dc -Temporarily holding Bumex   Hypothyroidism -Continue Synthroid   Diabetes mellitus type 2 -Holding metformin -NovoLog sliding scale -06/20/2022 hemoglobin A1c 6.3 -04/10/23 A1C--5.1   Hyperlipidemia -Continue statin   Diabetic polyneuropathy -Holding gabapentin in the setting of myoclonus with AKI   Hypomagnesemia/Hypokalemia -repleted  Seizure disorder -keppra decreased to 500 mg bid due to AKI--adjusted for renal function        Consultants: none Procedures performed: none  Disposition: Home Diet recommendation:  Cardiac diet DISCHARGE MEDICATION: Allergies as of 04/15/2023   No Known Allergies      Medication List     STOP taking these medications    carvedilol 12.5 MG  tablet Commonly known as: COREG   carvedilol 25 MG tablet Commonly known as: COREG   insulin glargine-yfgn 100 UNIT/ML Pen Commonly known as: SEMGLEE   Lantus SoloStar 100 UNIT/ML Solostar Pen Generic drug: insulin glargine   loperamide 2 MG tablet Commonly known as: IMODIUM A-D   pravastatin 40 MG tablet Commonly known as: PRAVACHOL   sodium chloride 1 g tablet   sulfamethoxazole-trimethoprim 800-160 MG tablet Commonly known as: BACTRIM DS       TAKE these medications    acetaminophen 325 MG tablet Commonly known as: Tylenol Take 2 tablets (650 mg total) by mouth every 6 (six) hours as needed.   allopurinol 100 MG tablet Commonly known as: ZYLOPRIM Take 100 mg by mouth daily.   bumetanide 1 MG tablet Commonly known as: BUMEX Take 1 mg by mouth See admin instructions. Take 3 tablets (3mg ) by mouth daily   cefdinir 300 MG capsule Commonly known as: OMNICEF Take 1 capsule (300 mg total) by mouth every 12 (twelve) hours. X 2 days   chlorhexidine 0.12 % solution Commonly known as: PERIDEX Use as directed 15 mLs in the mouth or throat 2 (two) times daily.   Entresto 49-51 MG Generic drug: sacubitril-valsartan Take 1 tablet by mouth 2 (two) times daily.   gabapentin 400 MG capsule Commonly known as: NEURONTIN Take 1 capsule (400 mg total) by mouth 3 (three) times daily.   lactulose 10 GM/15ML solution Commonly known as: CHRONULAC Take 10 g by mouth 3 (three) times daily.  levETIRAcetam 1000 MG tablet Commonly known as: KEPPRA Take 1 tablet (1,000 mg total) by mouth 2 (two) times daily.   levothyroxine 100 MCG tablet Commonly known as: SYNTHROID Take 100 mcg by mouth daily before breakfast.   metFORMIN 1000 MG tablet Commonly known as: GLUCOPHAGE TAKE ONE TABLET BY MOUTH TWICE DAILY WITH A MEAL. What changed:  how much to take how to take this when to take this additional instructions   metolazone 2.5 MG tablet Commonly known as: ZAROXOLYN Two  Times Weekly on Monday and Thursday What changed:  how much to take how to take this when to take this additional instructions   metoprolol tartrate 25 MG tablet Commonly known as: LOPRESSOR Take 1 tablet (25 mg total) by mouth 2 (two) times daily.   morphine CONCENTRATE 10 mg / 0.5 ml concentrated solution Take 5 mg by mouth every 2 (two) hours as needed for moderate pain.   pantoprazole 40 MG tablet Commonly known as: PROTONIX Take 1 tablet (40 mg total) by mouth daily.   Pataday 0.1 % ophthalmic solution Generic drug: olopatadine Place 1 drop into both eyes 2 (two) times daily.   polyethylene glycol powder 17 GM/SCOOP powder Commonly known as: MiraLax Take 17 g by mouth 2 (two) times daily as needed for moderate constipation or mild constipation.   potassium chloride 10 MEQ tablet Commonly known as: KLOR-CON Take 10 mEq by mouth 2 (two) times daily.   sertraline 50 MG tablet Commonly known as: ZOLOFT Take 50 mg by mouth daily.   tamsulosin 0.4 MG Caps capsule Commonly known as: FLOMAX TAKE (1) CAPSULE BY MOUTH EVERY DAY. What changed: See the new instructions.   traZODone 50 MG tablet Commonly known as: DESYREL Take 1 tablet (50 mg total) by mouth at bedtime. TAKE 1 TABLET BY MOUTH AT BEDTIME AS NEEDED FOR SLEEP. What changed:  how much to take additional instructions        Discharge Exam: Filed Weights   04/12/23 0424 04/13/23 0500 04/15/23 0535  Weight: 62.8 kg 65.3 kg 65.7 kg   HEENT:  [place]/AT, No thrush, no icterus CV:  RRR, no rub, no S3, no S4 Lung:  bibasilar rales.  No wheeze Abd:  soft/+BS, NT Ext:  No edema, no lymphangitis, no synovitis, no rash   Condition at discharge: stable  The results of significant diagnostics from this hospitalization (including imaging, microbiology, ancillary and laboratory) are listed below for reference.   Imaging Studies: ECHOCARDIOGRAM COMPLETE  Result Date: 04/14/2023    ECHOCARDIOGRAM REPORT   Patient  Name:   ELI PATTILLO Date of Exam: 04/14/2023 Medical Rec #:  [id number]       Height:       67.0 in Accession #:    [id number]      Weight:       144.0 lb Date of Birth:  Nov 28, 1945        BSA:          1.759 m Patient Age:    76 years        BP:           125/51 mmHg Patient Gender: M               HR:           88 bpm. [facility] Procedure: 2D Echo, Cardiac Doppler and Color Doppler Indications:    SVT (supraventricular tachycardia) [[id number]]  History:        Patient has  prior history of Echocardiogram examinations, most                 recent 09/23/2022. CHF, COPD; Risk Factors:Hypertension,                 Diabetes and Dyslipidemia. Hx of Paroxysmal atrial fibrillation                 with RVR (HCC).  Sonographer:    Celesta Gentile RCS Referring Phys: [phone] Tiondra Fang IMPRESSIONS  1. Left ventricular ejection fraction, by estimation, is 55 to 60%. The left ventricle has normal function. The left ventricle has no regional wall motion abnormalities. Left ventricular diastolic parameters are consistent with Grade I diastolic dysfunction (impaired relaxation).  2. Right ventricular systolic function is normal. The right ventricular size is normal. Tricuspid regurgitation signal is inadequate for assessing PA pressure.  3. Left atrial size was mild to moderately dilated.  4. The mitral valve is abnormal. No evidence of mitral valve regurgitation. No evidence of mitral stenosis. Moderate to severe mitral annular calcification.  5. The aortic valve is tricuspid. There is moderate calcification of the aortic valve. Aortic valve regurgitation is not visualized. No aortic stenosis is present.  6. The inferior vena cava is normal in size with greater than 50% respiratory variability, suggesting right atrial pressure of 3 mmHg. Comparison(s): No significant change from prior study. FINDINGS  Left Ventricle: Left ventricular ejection fraction, by estimation, is 55 to 60%. The left ventricle has normal function.  The left ventricle has no regional wall motion abnormalities. The left ventricular internal cavity size was normal in size. There is  no left ventricular hypertrophy. Left ventricular diastolic parameters are consistent with Grade I diastolic dysfunction (impaired relaxation). Right Ventricle: The right ventricular size is normal. No increase in right ventricular wall thickness. Right ventricular systolic function is normal. Tricuspid regurgitation signal is inadequate for assessing PA pressure. Left Atrium: Left atrial size was mild to moderately dilated. Right Atrium: Right atrial size was normal in size. Pericardium: There is no evidence of pericardial effusion. Mitral Valve: The mitral valve is abnormal. Moderate to severe mitral annular calcification. No evidence of mitral valve regurgitation. No evidence of mitral valve stenosis. Tricuspid Valve: The tricuspid valve is not well visualized. Tricuspid valve regurgitation is not demonstrated. No evidence of tricuspid stenosis. Aortic Valve: The aortic valve is tricuspid. There is moderate calcification of the aortic valve. Aortic valve regurgitation is not visualized. No aortic stenosis is present. Pulmonic Valve: The pulmonic valve was not well visualized. Pulmonic valve regurgitation is trivial. No evidence of pulmonic stenosis. Aorta: The aortic root is normal in size and structure. Venous: The inferior vena cava is normal in size with greater than 50% respiratory variability, suggesting right atrial pressure of 3 mmHg. IAS/Shunts: No atrial level shunt detected by color flow Doppler.  LEFT VENTRICLE PLAX 2D LVIDd:         3.70 cm   Diastology LVIDs:         2.50 cm   LV e' medial:    6.85 cm/s LV PW:         0.90 cm   LV E/e' medial:  11.4 LV IVS:        1.00 cm   LV e' lateral:   7.18 cm/s LVOT diam:     2.00 cm   LV E/e' lateral: 10.9 LV SV:         48 LV SV Index:   28 LVOT Area:  3.14 cm  RIGHT VENTRICLE RV S prime:     13.10 cm/s TAPSE (M-mode): 2.0  cm LEFT ATRIUM             Index        RIGHT ATRIUM           Index LA diam:        3.30 cm 1.88 cm/m   RA Area:     17.40 cm LA Vol (A2C):   86.6 ml 49.25 ml/m  RA Volume:   48.20 ml  27.41 ml/m LA Vol (A4C):   55.7 ml 31.67 ml/m LA Biplane Vol: 72.6 ml 41.28 ml/m  AORTIC VALVE LVOT Vmax:   85.50 cm/s LVOT Vmean:  56.300 cm/s LVOT VTI:    0.154 m  AORTA Ao Root diam: 3.70 cm MITRAL VALVE MV Area (PHT): 3.56 cm    SHUNTS MV Decel Time: 213 msec    Systemic VTI:  0.15 m MV E velocity: 78.20 cm/s  Systemic Diam: 2.00 cm MV A velocity: 96.05 cm/s MV E/A ratio:  0.81 Vishnu Priya Mallipeddi Electronically signed by Winfield Rast Mallipeddi Signature Date/Time: 04/14/2023/3:09:34 PM    Final    CT CHEST ABDOMEN PELVIS WO CONTRAST  Result Date: 04/10/2023 CLINICAL DATA:  Known UTI with altered mental status EXAM: CT CHEST, ABDOMEN AND PELVIS WITHOUT CONTRAST TECHNIQUE: Multidetector CT imaging of the chest, abdomen and pelvis was performed following the standard protocol without IV contrast. RADIATION DOSE REDUCTION: This exam was performed according to the departmental dose-optimization program which includes automated exposure control, adjustment of the mA and/or kV according to patient size and/or use of iterative reconstruction technique. COMPARISON:  CTA chest dated 09/23/2022, CT cervical spine dated 09/14/2022, CT abdomen and pelvis dated 08/31/2004 FINDINGS: CT CHEST FINDINGS Cardiovascular: Normal heart size. No significant pericardial fluid/thickening. Great vessels are normal in course and caliber. Coronary artery calcifications. Mediastinum/Nodes: Imaged thyroid gland without nodules meeting criteria for imaging follow-up by size. Normal esophagus. No pathologically enlarged axillary, supraclavicular, mediastinal, or hilar lymph nodes. Lungs/Pleura: The central airways are patent. Left lower lobe subsegmental atelectasis. No pneumothorax. Trace left pleural effusion. Musculoskeletal: No acute or  abnormal lytic or blastic osseous lesions. Multilevel degenerative changes of the thoracic spine. Increased anterior wedging of T1 with new grade 1 anterolisthesis at C7-T1 compared to 09/14/2022. Mandible with periapical lucency along the left frontal and lateral incisors. CT ABDOMEN PELVIS FINDINGS Hepatobiliary: Mildly nodular hepatic contour. No intra or extrahepatic biliary ductal dilation. Normal gallbladder. Pancreas: No focal lesions or main ductal dilation. Spleen: Normal in size without focal abnormality. Adrenals/Urinary Tract: No adrenal nodules. No suspicious renal mass, calculi, or hydronephrosis. Circumferential mural thickening of the underdistended bladder with catheter in-situ. Stomach/Bowel: Normal appearance of the stomach. No evidence of bowel wall thickening, distention, or inflammatory changes. Colonic diverticulosis without acute diverticulitis. Normal appendix. Vascular/Lymphatic: Aortic atherosclerosis. No enlarged abdominal or pelvic lymph nodes. Reproductive: Prostate is unremarkable. Other: No free fluid, fluid collection, or free air. Musculoskeletal: No acute or abnormal lytic or blastic osseous findings. Degenerative changes of the bilateral hips. Multilevel degenerative changes of the lumbar spine. Fat containing lower midline anterior abdominal hernia. IMPRESSION: 1. Circumferential mural thickening of the underdistended bladder with catheter in-situ, which may be due to underdistention or cystitis. 2. Increased anterior wedging of T1 with new grade 1 anterolisthesis at C7-T1 compared to 09/14/2022. 3. Mildly nodular hepatic contour, which can be seen in the setting of cirrhosis. 4. Trace left pleural effusion. 5. Aortic Atherosclerosis (ICD10-I70.0).  Coronary artery calcifications. Assessment for potential risk factor modification, dietary therapy or pharmacologic therapy may be warranted, if clinically indicated. Electronically Signed   By: Agustin Cree M.D.   On: 04/10/2023 13:36    DG Chest Port 1 View  Result Date: 04/10/2023 CLINICAL DATA:  Questionable sepsis.  Evaluate for abnormality. EXAM: PORTABLE CHEST 1 VIEW COMPARISON:  11/19/2022. FINDINGS: Left basilar atelectasis. No consolidation or pulmonary edema. Stable cardiac and mediastinal contours. No pleural effusion or pneumothorax. IMPRESSION: Left basilar atelectasis. No consolidation. Electronically Signed   By: Orvan Falconer M.D.   On: 04/10/2023 11:17    Microbiology: Results for orders placed or performed during the hospital encounter of 04/10/23  Urine Culture (for pregnant, neutropenic or urologic patients or patients with an indwelling urinary catheter)     Status: Abnormal   Collection Time: 04/10/23 10:43 AM   Specimen: Urine, Clean Catch  Result Value Ref Range Status   Specimen Description   Final    URINE, CLEAN CATCH Performed at [facility], [address], [place], [place] [address]    Special Requests   Final    NONE Performed at [facility], [address] [place], [place] [address]    Culture MULTIPLE SPECIES PRESENT, SUGGEST RECOLLECTION (A)  Final   Report Status 04/11/2023 FINAL  Final  Blood Culture (routine x 2)     Status: None   Collection Time: 04/10/23 11:07 AM   Specimen: BLOOD  Result Value Ref Range Status   Specimen Description BLOOD RIGHT ANTECUBITAL  Final   Special Requests   Final    Blood Culture adequate volume BOTTLES DRAWN AEROBIC AND ANAEROBIC   Culture   Final    NO GROWTH 5 DAYS Performed at [facility], [address] [place], [place] [address]    Report Status 04/15/2023 FINAL  Final  Blood Culture (routine x 2)     Status: None   Collection Time: 04/10/23 11:07 AM   Specimen: BLOOD  Result Value Ref Range Status   Specimen Description BLOOD BLOOD LEFT HAND  Final   Special Requests   Final    BOTTLES DRAWN AEROBIC AND ANAEROBIC Blood Culture adequate volume   Culture   Final    NO GROWTH 5 DAYS Performed at [facility], [address],  [place], [place] [address]    Report Status 04/15/2023 FINAL  Final  MRSA Next Gen by PCR, Nasal     Status: None   Collection Time: 04/10/23  4:04 PM   Specimen: Nasal Mucosa; Nasal Swab  Result Value Ref Range Status   MRSA by PCR Next Gen NOT DETECTED NOT DETECTED Final    Comment: (NOTE) The GeneXpert MRSA Assay (FDA approved for NASAL specimens only), is one component of a comprehensive MRSA colonization surveillance program. It is not intended to diagnose MRSA infection nor to guide or monitor treatment for MRSA infections. Test performance is not FDA approved in patients less than 77 years old. Performed at [facility], [address], [place], [place] [address]    *Note: Due to a large number of results and/or encounters for the requested time period, some results have not been displayed. A complete set of results can be found in Results Review.    Labs: CBC: Recent Labs  Lab 04/10/23 1017 04/11/23 0424 04/12/23 0523 04/13/23 0536  WBC 5.5 5.9 4.1 3.8*  NEUTROABS 4.2  --   --   --   HGB 11.7* 11.2* 10.2* 10.6*  HCT 35.1* 33.8*  30.4* 31.0*  MCV 95.9 97.1 96.5 93.9  PLT 169 154 138* 139*   Basic Metabolic Panel: Recent Labs  Lab 04/10/23 1017 04/11/23 0424 04/12/23 0523 04/13/23 0536 04/14/23 0355 04/14/23 0422  NA 143 143 146* 142 145  --   K 6.3* 4.4 3.3* 3.8 3.7  --   CL 121* 122* 121* 116* 118*  --   CO2 15* 13* 18* 20* 20*  --   GLUCOSE 143* 143* 121* 96 96  --   BUN 74* 68* 59* 47* 41*  --   CREATININE 3.61* 3.22* 2.71* 2.03* 1.94*  --   CALCIUM 11.8* 10.8* 10.0 9.7 9.6  --   MG  --  1.3* 1.8 1.5*  --  2.4   Liver Function Tests: Recent Labs  Lab 04/10/23 1017 04/11/23 0424  AST 17 19  ALT 31 30  ALKPHOS 117 101  BILITOT 0.9 1.0  PROT 6.3* 5.6*  ALBUMIN 3.4* 2.9*   CBG: No results for input(s): "GLUCAP" in the last 168 hours.  Discharge time spent: greater than 30 minutes.  Signed: Catarina Hartshorn, MD [facility] 04/15/2023

## 2023-04-15 NOTE — [place] FL2 (Signed)
[place] MEDICAID FL2 LEVEL OF CARE FORM     IDENTIFICATION  Patient Name: Michael [name] Birthdate: [birth year] Sex: male Admission Date (Current Location): 04/10/2023  [facility] [place] and [place] Number:  [employer] and Address:  [facility],  [address], [place] [address]      Provider Number: [id number]  Attending Physician Name and Address:  Catarina Hartshorn, MD  Relative Name and Phone Number:  Nelta Numbers (Niece)  [phone] (Home Phone)    Current Level of Care: Hospital Recommended Level of Care: Assisted Living Facility Prior Approval Number:    Date Approved/Denied:   PASRR Number:    Discharge Plan: Other (Comment) [facility] [place] [facility] [place])    Current Diagnoses: Patient Active Problem List   Diagnosis Date Noted   Multifocal atrial tachycardia 04/14/2023   Sepsis due to undetermined organism (HCC) 04/10/2023   Cystitis with hematuria 04/10/2023   Hypercalcemia 04/10/2023   Acute renal failure superimposed on stage 3a chronic kidney disease (HCC) 04/10/2023   UTI (urinary tract infection) 11/18/2022   AKI (acute kidney injury) (HCC) 11/18/2022   Severe sepsis (HCC) 11/18/2022   Acute metabolic encephalopathy 11/18/2022   Tonic-clonic seizure (HCC) 10/01/2022   Pressure injury of skin 10/01/2022   Paroxysmal atrial fibrillation with RVR (HCC) 10/01/2022   Venous stasis dermatitis 10/01/2022   Disorientation 09/19/2022   Hypertensive crisis 09/18/2022   Encounter for general adult medical examination with abnormal findings 06/20/2022   Onychomycosis 06/20/2022   Physical deconditioning 12/21/2021   Acute cystitis without hematuria 07/10/2021   Benign prostatic hyperplasia with urinary frequency 07/10/2021   Vitamin D deficiency 05/23/2021   Anxiety 01/11/2021   Chronic obstructive lung disease (HCC) 01/11/2021   Contact dermatitis and other eczema 01/11/2021   Insulin dependent type 2 diabetes mellitus (HCC) 01/11/2021   Diabetic  peripheral neuropathy (HCC) 01/11/2021   Gout 01/11/2021   History of colonic polyps 01/11/2021   Ventral hernia 01/11/2021   Neck pain 11/20/2020   Lymphedema of lower extremity 11/18/2018   Chronic pain syndrome 05/06/2018   Depression, major, single episode, mild (HCC) 06/20/2017   DNR (do not resuscitate) 06/20/2017   Chronic HFrEF (heart failure with reduced ejection fraction) (HCC) 10/23/2016   Hypothyroidism 11/30/2014   Sinus pause 07/21/2014   Difficulty walking 05/03/2014   Acute anemia 07/14/2013   Spinal stenosis of lumbar region 07/06/2013   Carpal tunnel syndrome 04/13/2013   DDD (degenerative disc disease), lumbosacral 04/13/2013   Peripheral neuropathy 01/05/2013   S/P arthroscopy of left knee 11/09/2012   Osteoarthritis of knee 10/07/2012   NASH (nonalcoholic steatohepatitis) 05/22/2007   Hyperlipidemia 10/15/2006   Class 3 obesity (HCC) 10/15/2006   Hypertension 10/15/2006   Gastroesophageal reflux disease 10/15/2006    Orientation RESPIRATION BLADDER Height & Weight     Self, Place  Normal External catheter, Incontinent Weight: 144 lb 13.5 oz (65.7 kg) Height:  5\' 7"  (170.2 cm)  BEHAVIORAL SYMPTOMS/MOOD NEUROLOGICAL BOWEL NUTRITION STATUS      Continent Diet  AMBULATORY STATUS COMMUNICATION OF NEEDS Skin   Extensive Assist Verbally (Slurred) Other (Comment) (Dry)                       Personal Care Assistance Level of Assistance  Bathing, Feeding, Dressing Bathing Assistance: Limited assistance Feeding assistance: Limited assistance Dressing Assistance: Limited assistance     Functional Limitations Info  Sight, Hearing, Speech Sight Info: Adequate Hearing Info: Adequate Speech Info: Adequate    SPECIAL CARE FACTORS  FREQUENCY                       Contractures Contractures Info: Not present    Additional Factors Info  Code Status, Allergies Code Status Info: DNR Allergies Info: NKA           Current Medications  (04/15/2023):  This is the current hospital active medication list Current Facility-Administered Medications  Medication Dose Route Frequency Provider Last Rate Last Admin   acetaminophen (TYLENOL) tablet 650 mg  650 mg Oral Q6H PRN Tat, Onalee Hua, MD   650 mg at 04/13/23 1612   Or   acetaminophen (TYLENOL) suppository 650 mg  650 mg Rectal Q6H PRN Tat, Onalee Hua, MD       cefdinir (OMNICEF) capsule 300 mg  300 mg Oral Q12H Tat, Onalee Hua, MD       Chlorhexidine Gluconate Cloth 2 % PADS 6 each  6 each Topical Daily Tat, David, MD   6 each at 04/15/23 0813   diltiazem (CARDIZEM) 125 mg in dextrose 5% 125 mL (1 mg/mL) infusion  5-15 mg/hr Intravenous Continuous Tat, Onalee Hua, MD   Stopped at 04/14/23 1733   heparin injection 5,000 Units  5,000 Units Subcutaneous Andres Labrum, MD   5,000 Units at 04/15/23 0551   lactulose (CHRONULAC) 10 GM/15ML solution 10 g  10 g Oral TID Catarina Hartshorn, MD   10 g at 04/15/23 8119   levETIRAcetam (KEPPRA) IVPB 500 mg/100 mL premix  500 mg Intravenous Pablo Ledger, MD 400 mL/hr at 04/15/23 0514 500 mg at 04/15/23 0514   levothyroxine (SYNTHROID) tablet 100 mcg  100 mcg Oral QAC breakfast Tat, Onalee Hua, MD   100 mcg at 04/15/23 0551   metoprolol tartrate (LOPRESSOR) tablet 25 mg  25 mg Oral BID Tat, Onalee Hua, MD   25 mg at 04/15/23 1049   ondansetron (ZOFRAN) tablet 4 mg  4 mg Oral Q6H PRN Tat, Onalee Hua, MD       Or   ondansetron [facility]) injection 4 mg  4 mg Intravenous Q6H PRN Catarina Hartshorn, MD   4 mg at 04/11/23 0323   Oral care mouth rinse  15 mL Mouth Rinse 4 times per day Tat, Onalee Hua, MD       Oral care mouth rinse  15 mL Mouth Rinse PRN Tat, Onalee Hua, MD       sertraline (ZOLOFT) tablet 50 mg  50 mg Oral Daily Tat, Onalee Hua, MD   50 mg at 04/15/23 1478   tamsulosin (FLOMAX) capsule 0.4 mg  0.4 mg Oral Daily Tat, David, MD   0.4 mg at 04/15/23 2956     Discharge Medications: acetaminophen 325 MG tablet Commonly known as: Tylenol Take 2 tablets (650 mg total) by mouth every 6 (six) hours  as needed.    allopurinol 100 MG tablet Commonly known as: ZYLOPRIM Take 100 mg by mouth daily.    bumetanide 1 MG tablet Commonly known as: BUMEX Take 1 mg by mouth See admin instructions. Take 3 tablets (3mg ) by mouth daily    cefdinir 300 MG capsule Commonly known as: OMNICEF Take 1 capsule (300 mg total) by mouth every 12 (twelve) hours. X 2 days    chlorhexidine 0.12 % solution Commonly known as: PERIDEX Use as directed 15 mLs in the mouth or throat 2 (two) times daily.    Entresto 49-51 MG Generic drug: sacubitril-valsartan Take 1 tablet by mouth 2 (two) times daily.    gabapentin 400 MG capsule Commonly known as: NEURONTIN  Take 1 capsule (400 mg total) by mouth 3 (three) times daily.    lactulose 10 GM/15ML solution Commonly known as: CHRONULAC Take 10 g by mouth 3 (three) times daily.    levETIRAcetam 1000 MG tablet Commonly known as: KEPPRA Take 1 tablet (1,000 mg total) by mouth 2 (two) times daily.    levothyroxine 100 MCG tablet Commonly known as: SYNTHROID Take 100 mcg by mouth daily before breakfast.    metFORMIN 1000 MG tablet Commonly known as: GLUCOPHAGE TAKE ONE TABLET BY MOUTH TWICE DAILY WITH A MEAL. What changed:  how much to take how to take this when to take this additional instructions    metolazone 2.5 MG tablet Commonly known as: ZAROXOLYN Two Times Weekly on Monday and Thursday What changed:  how much to take how to take this when to take this additional instructions    metoprolol tartrate 25 MG tablet Commonly known as: LOPRESSOR Take 1 tablet (25 mg total) by mouth 2 (two) times daily.    morphine CONCENTRATE 10 mg / 0.5 ml concentrated solution Take 5 mg by mouth every 2 (two) hours as needed for moderate pain.    pantoprazole 40 MG tablet Commonly known as: PROTONIX Take 1 tablet (40 mg total) by mouth daily.    Pataday 0.1 % ophthalmic solution Generic drug: olopatadine Place 1 drop into both eyes 2 (two) times  daily.    polyethylene glycol powder 17 GM/SCOOP powder Commonly known as: MiraLax Take 17 g by mouth 2 (two) times daily as needed for moderate constipation or mild constipation.    potassium chloride 10 MEQ tablet Commonly known as: KLOR-CON Take 10 mEq by mouth 2 (two) times daily.    sertraline 50 MG tablet Commonly known as: ZOLOFT Take 50 mg by mouth daily.    tamsulosin 0.4 MG Caps capsule Commonly known as: FLOMAX TAKE (1) CAPSULE BY MOUTH EVERY DAY. What changed: See the new instructions.    traZODone 50 MG tablet Commonly known as: DESYREL Take 1 tablet (50 mg total) by mouth at bedtime. TAKE 1 TABLET BY MOUTH AT BEDTIME AS NEEDED FOR SLEEP. What changed:  how much to take additional instructions    Relevant Imaging Results:  Relevant Lab Results:   Additional Information Hospice care at the ALF   SSN: [id number], [address]

## 2023-04-15 NOTE — TOC Transition Note (Signed)
Transition of Care [facility]) - CM/SW Discharge Note   Patient Details  Name: Michael [name] MRN: [id number] Date of Birth: 1946-01-17  [facility] of [facility]) CM/SW Contact:  Villa Herb, LCSWA Phone Number: 04/15/2023, 1:14 PM   Clinical Narrative:    CSW updated that pt is medically ready for D/C back to ALF. CSW spoke to [employer] who states they are ready to accept back. CSW faxed D/C clinicals to facility. CSW updated RN that med necessity has been printed to the floor. EMS to be called. CSW attempted to reach niece to update, unable to reach at this time. TOC signing off.   Final next level of care: Assisted Living Barriers to Discharge: Barriers Resolved   Patient Goals and CMS Choice      Discharge Placement                         Discharge Plan and Services Additional resources added to the After Visit Summary for   In-house Referral: Clinical Social Work                                   Social Determinants of Health (SDOH) Interventions SDOH Screenings   Food Insecurity: No Food Insecurity (09/19/2022)  Housing: Low Risk  (09/19/2022)  Transportation Needs: No Transportation Needs (09/19/2022)  Utilities: Not At Risk (09/19/2022)  Alcohol Screen: Low Risk  (04/18/2022)  Depression (PHQ2-9): Low Risk  (06/20/2022)  Financial Resource Strain: Low Risk  (04/18/2022)  Physical Activity: Inactive (04/18/2022)  Social Connections: Moderately Isolated (04/18/2022)  Stress: No Stress Concern Present (04/18/2022)  Tobacco Use: Medium Risk (04/10/2023)     Readmission Risk Interventions    04/12/2023    5:39 PM 04/11/2023    4:24 PM 11/19/2022   11:00 AM  Readmission Risk Prevention Plan  Transportation Screening  Complete Complete  PCP or Specialist Appt within 3-5 Days  Complete   HRI or Home Care Consult -- Not Complete   Social Work Consult for Recovery Care Planning/Counseling Complete Complete   Palliative Care Screening Complete Not  Applicable   Medication Review Oceanographer) Complete Complete Complete  HRI or Home Care Consult   Complete  SW Recovery Care/Counseling Consult   Complete  Palliative Care Screening   Not Applicable  Skilled Nursing Facility   Not Applicable

## 2023-04-17 LAB — PROTEIN ELECTROPHORESIS, SERUM
A/G Ratio: 1.2 (ref 0.7–1.7)
Albumin ELP: 2.5 g/dL — ABNORMAL LOW (ref 2.9–4.4)
Alpha-1-Globulin: 0.2 g/dL (ref 0.0–0.4)
Alpha-2-Globulin: 1 g/dL (ref 0.4–1.0)
Beta Globulin: 0.5 g/dL — ABNORMAL LOW (ref 0.7–1.3)
Gamma Globulin: 0.4 g/dL (ref 0.4–1.8)
Globulin, Total: 2.1 g/dL — ABNORMAL LOW (ref 2.2–3.9)
Total Protein ELP: 4.6 g/dL — ABNORMAL LOW (ref 6.0–8.5)

## 2023-04-18 LAB — PTH-RELATED PEPTIDE: PTH-related peptide: <2 pmol/L

## 2024-01-20 ENCOUNTER — Observation Stay (HOSPITAL_COMMUNITY)

## 2024-01-20 ENCOUNTER — Encounter (HOSPITAL_COMMUNITY): Payer: Self-pay | Admitting: *Deleted

## 2024-01-20 ENCOUNTER — Emergency Department (HOSPITAL_COMMUNITY)

## 2024-01-20 ENCOUNTER — Other Ambulatory Visit: Payer: Self-pay

## 2024-01-20 ENCOUNTER — Inpatient Hospital Stay (HOSPITAL_COMMUNITY)
Admission: EM | Admit: 2024-01-20 | Discharge: 2024-01-22 | DRG: 683 | Source: Skilled Nursing Facility | Attending: Internal Medicine | Admitting: Internal Medicine

## 2024-01-20 DIAGNOSIS — E1142 Type 2 diabetes mellitus with diabetic polyneuropathy: Secondary | ICD-10-CM | POA: Diagnosis present

## 2024-01-20 DIAGNOSIS — N1831 Chronic kidney disease, stage 3a: Secondary | ICD-10-CM | POA: Diagnosis present

## 2024-01-20 DIAGNOSIS — E785 Hyperlipidemia, unspecified: Secondary | ICD-10-CM | POA: Diagnosis present

## 2024-01-20 DIAGNOSIS — E1121 Type 2 diabetes mellitus with diabetic nephropathy: Secondary | ICD-10-CM | POA: Diagnosis present

## 2024-01-20 DIAGNOSIS — E860 Dehydration: Principal | ICD-10-CM | POA: Diagnosis present

## 2024-01-20 DIAGNOSIS — Z993 Dependence on wheelchair: Secondary | ICD-10-CM

## 2024-01-20 DIAGNOSIS — G40909 Epilepsy, unspecified, not intractable, without status epilepticus: Secondary | ICD-10-CM

## 2024-01-20 DIAGNOSIS — I959 Hypotension, unspecified: Secondary | ICD-10-CM | POA: Diagnosis present

## 2024-01-20 DIAGNOSIS — N138 Other obstructive and reflux uropathy: Secondary | ICD-10-CM | POA: Diagnosis present

## 2024-01-20 DIAGNOSIS — N179 Acute kidney failure, unspecified: Principal | ICD-10-CM | POA: Diagnosis present

## 2024-01-20 DIAGNOSIS — K7581 Nonalcoholic steatohepatitis (NASH): Secondary | ICD-10-CM | POA: Diagnosis present

## 2024-01-20 DIAGNOSIS — E871 Hypo-osmolality and hyponatremia: Secondary | ICD-10-CM | POA: Diagnosis present

## 2024-01-20 DIAGNOSIS — Z66 Do not resuscitate: Secondary | ICD-10-CM | POA: Diagnosis present

## 2024-01-20 DIAGNOSIS — Z833 Family history of diabetes mellitus: Secondary | ICD-10-CM

## 2024-01-20 DIAGNOSIS — Z8249 Family history of ischemic heart disease and other diseases of the circulatory system: Secondary | ICD-10-CM

## 2024-01-20 DIAGNOSIS — I878 Other specified disorders of veins: Secondary | ICD-10-CM | POA: Diagnosis present

## 2024-01-20 DIAGNOSIS — Z794 Long term (current) use of insulin: Secondary | ICD-10-CM

## 2024-01-20 DIAGNOSIS — N401 Enlarged prostate with lower urinary tract symptoms: Secondary | ICD-10-CM | POA: Diagnosis present

## 2024-01-20 DIAGNOSIS — J449 Chronic obstructive pulmonary disease, unspecified: Secondary | ICD-10-CM | POA: Diagnosis present

## 2024-01-20 DIAGNOSIS — A419 Sepsis, unspecified organism: Secondary | ICD-10-CM

## 2024-01-20 DIAGNOSIS — Z83438 Family history of other disorder of lipoprotein metabolism and other lipidemia: Secondary | ICD-10-CM

## 2024-01-20 DIAGNOSIS — F419 Anxiety disorder, unspecified: Secondary | ICD-10-CM | POA: Diagnosis present

## 2024-01-20 DIAGNOSIS — K219 Gastro-esophageal reflux disease without esophagitis: Secondary | ICD-10-CM | POA: Diagnosis present

## 2024-01-20 DIAGNOSIS — Z87891 Personal history of nicotine dependence: Secondary | ICD-10-CM

## 2024-01-20 DIAGNOSIS — I5032 Chronic diastolic (congestive) heart failure: Secondary | ICD-10-CM | POA: Diagnosis present

## 2024-01-20 DIAGNOSIS — I130 Hypertensive heart and chronic kidney disease with heart failure and stage 1 through stage 4 chronic kidney disease, or unspecified chronic kidney disease: Secondary | ICD-10-CM | POA: Diagnosis present

## 2024-01-20 DIAGNOSIS — E1122 Type 2 diabetes mellitus with diabetic chronic kidney disease: Secondary | ICD-10-CM | POA: Diagnosis present

## 2024-01-20 DIAGNOSIS — R338 Other retention of urine: Secondary | ICD-10-CM | POA: Diagnosis present

## 2024-01-20 DIAGNOSIS — E039 Hypothyroidism, unspecified: Secondary | ICD-10-CM | POA: Diagnosis present

## 2024-01-20 DIAGNOSIS — E876 Hypokalemia: Secondary | ICD-10-CM | POA: Diagnosis present

## 2024-01-20 DIAGNOSIS — F0394 Unspecified dementia, unspecified severity, with anxiety: Secondary | ICD-10-CM | POA: Diagnosis present

## 2024-01-20 DIAGNOSIS — G894 Chronic pain syndrome: Secondary | ICD-10-CM | POA: Diagnosis present

## 2024-01-20 DIAGNOSIS — Z79890 Hormone replacement therapy (postmenopausal): Secondary | ICD-10-CM

## 2024-01-20 DIAGNOSIS — Z79899 Other long term (current) drug therapy: Secondary | ICD-10-CM

## 2024-01-20 DIAGNOSIS — M109 Gout, unspecified: Secondary | ICD-10-CM | POA: Diagnosis present

## 2024-01-20 DIAGNOSIS — Z7401 Bed confinement status: Secondary | ICD-10-CM

## 2024-01-20 LAB — URINALYSIS, W/ REFLEX TO CULTURE (INFECTION SUSPECTED)
Bacteria, UA: NONE SEEN
Bilirubin Urine: NEGATIVE
Glucose, UA: NEGATIVE mg/dL
Hgb urine dipstick: NEGATIVE
Ketones, ur: NEGATIVE mg/dL
Leukocytes,Ua: NEGATIVE
Nitrite: NEGATIVE
Protein, ur: NEGATIVE mg/dL
Specific Gravity, Urine: 1.009 (ref 1.005–1.030)
pH: 5 (ref 5.0–8.0)

## 2024-01-20 LAB — COMPREHENSIVE METABOLIC PANEL WITH GFR
ALT: 15 U/L (ref 0–44)
AST: 21 U/L (ref 15–41)
Albumin: 3.4 g/dL — ABNORMAL LOW (ref 3.5–5.0)
Alkaline Phosphatase: 136 U/L — ABNORMAL HIGH (ref 38–126)
Anion gap: 13 (ref 5–15)
BUN: 56 mg/dL — ABNORMAL HIGH (ref 8–23)
CO2: 24 mmol/L (ref 22–32)
Calcium: 8 mg/dL — ABNORMAL LOW (ref 8.9–10.3)
Chloride: 83 mmol/L — ABNORMAL LOW (ref 98–111)
Creatinine, Ser: 2.48 mg/dL — ABNORMAL HIGH (ref 0.61–1.24)
GFR, Estimated: 26 mL/min — ABNORMAL LOW (ref >60)
Glucose, Bld: 230 mg/dL — ABNORMAL HIGH (ref 70–99)
Potassium: 4.1 mmol/L (ref 3.5–5.1)
Sodium: 120 mmol/L — ABNORMAL LOW (ref 135–145)
Total Bilirubin: 0.6 mg/dL (ref 0.0–1.2)
Total Protein: 6.4 g/dL — ABNORMAL LOW (ref 6.5–8.1)

## 2024-01-20 LAB — OSMOLALITY: Osmolality: 283 mosm/kg (ref 275–295)

## 2024-01-20 LAB — GLUCOSE, CAPILLARY
Glucose-Capillary: 255 mg/dL — ABNORMAL HIGH (ref 70–99)
Glucose-Capillary: 163 mg/dL — ABNORMAL HIGH (ref 70–99)

## 2024-01-20 LAB — RESP PANEL BY RT-PCR (RSV, FLU A&B, COVID)  RVPGX2
Influenza A by PCR: NEGATIVE
Influenza B by PCR: NEGATIVE
Resp Syncytial Virus by PCR: NEGATIVE
SARS Coronavirus 2 by RT PCR: NEGATIVE

## 2024-01-20 LAB — HEMOGLOBIN A1C
Hgb A1c MFr Bld: 9.4 % — ABNORMAL HIGH (ref 4.8–5.6)
Mean Plasma Glucose: 223.08 mg/dL

## 2024-01-20 LAB — CBC WITH DIFFERENTIAL/PLATELET
Abs Immature Granulocytes: 0.03 10*3/uL (ref 0.00–0.07)
Basophils Absolute: 0 10*3/uL (ref 0.0–0.1)
Basophils Relative: 1 %
Eosinophils Absolute: 0.2 10*3/uL (ref 0.0–0.5)
Eosinophils Relative: 3 %
HCT: 29.4 % — ABNORMAL LOW (ref 39.0–52.0)
Hemoglobin: 10.3 g/dL — ABNORMAL LOW (ref 13.0–17.0)
Immature Granulocytes: 0 %
Lymphocytes Relative: 10 %
Lymphs Abs: 0.7 10*3/uL (ref 0.7–4.0)
MCH: 28.9 pg (ref 26.0–34.0)
MCHC: 35 g/dL (ref 30.0–36.0)
MCV: 82.6 fL (ref 80.0–100.0)
Monocytes Absolute: 0.6 10*3/uL (ref 0.1–1.0)
Monocytes Relative: 8 %
Neutro Abs: 5.4 10*3/uL (ref 1.7–7.7)
Neutrophils Relative %: 78 %
Platelets: 169 10*3/uL (ref 150–400)
RBC: 3.56 MIL/uL — ABNORMAL LOW (ref 4.22–5.81)
RDW: 14 % (ref 11.5–15.5)
WBC: 6.9 10*3/uL (ref 4.0–10.5)
nRBC: 0 % (ref 0.0–0.2)

## 2024-01-20 LAB — TSH: TSH: 3.224 u[IU]/mL (ref 0.350–4.500)

## 2024-01-20 LAB — SODIUM, URINE, RANDOM: Sodium, Ur: 36 mmol/L

## 2024-01-20 LAB — BRAIN NATRIURETIC PEPTIDE: B Natriuretic Peptide: 126 pg/mL — ABNORMAL HIGH (ref 0.0–100.0)

## 2024-01-20 LAB — BLOOD GAS, VENOUS
Acid-Base Excess: 0.2 mmol/L (ref 0.0–2.0)
Bicarbonate: 26.9 mmol/L (ref 20.0–28.0)
Drawn by: 1854
O2 Saturation: 43.8 %
Patient temperature: 38
pCO2, Ven: 53 mmHg (ref 44–60)
pH, Ven: 7.32 (ref 7.25–7.43)
pO2, Ven: <31 mmHg — CL (ref 32–45)

## 2024-01-20 LAB — PROTIME-INR
INR: 1 (ref 0.8–1.2)
Prothrombin Time: 13.8 s (ref 11.4–15.2)

## 2024-01-20 LAB — LACTIC ACID, PLASMA
Lactic Acid, Venous: 1.7 mmol/L (ref 0.5–1.9)
Lactic Acid, Venous: 1.3 mmol/L (ref 0.5–1.9)

## 2024-01-20 LAB — MAGNESIUM: Magnesium: 1.4 mg/dL — ABNORMAL LOW (ref 1.7–2.4)

## 2024-01-20 LAB — TROPONIN I (HIGH SENSITIVITY)
Troponin I (High Sensitivity): 10 ng/L (ref <18)
Troponin I (High Sensitivity): 9 ng/L (ref <18)

## 2024-01-20 MED ORDER — LEVETIRACETAM 500 MG PO TABS
500.0000 mg | ORAL_TABLET | Freq: Two times a day (BID) | ORAL | Status: DC
  Administered 2024-01-20 – 2024-01-22 (×4): 500 mg via ORAL
  Filled 2024-01-20 (×4): qty 1

## 2024-01-20 MED ORDER — PANTOPRAZOLE SODIUM 40 MG PO TBEC
40.0000 mg | DELAYED_RELEASE_TABLET | Freq: Every day | ORAL | Status: DC
  Administered 2024-01-20 – 2024-01-22 (×3): 40 mg via ORAL
  Filled 2024-01-20 (×3): qty 1

## 2024-01-20 MED ORDER — TAMSULOSIN HCL 0.4 MG PO CAPS
0.4000 mg | ORAL_CAPSULE | Freq: Every day | ORAL | Status: DC
  Administered 2024-01-21 – 2024-01-22 (×2): 0.4 mg via ORAL
  Filled 2024-01-20 (×2): qty 1

## 2024-01-20 MED ORDER — ZINC OXIDE 40 % EX OINT
TOPICAL_OINTMENT | Freq: Every day | CUTANEOUS | Status: DC
  Filled 2024-01-20: qty 57

## 2024-01-20 MED ORDER — HEPARIN SODIUM (PORCINE) 5000 UNIT/ML IJ SOLN
5000.0000 [IU] | Freq: Three times a day (TID) | INTRAMUSCULAR | Status: DC
  Administered 2024-01-20 – 2024-01-22 (×5): 5000 [IU] via SUBCUTANEOUS
  Filled 2024-01-20 (×5): qty 1

## 2024-01-20 MED ORDER — ONDANSETRON HCL 4 MG PO TABS: 4.0000 mg | ORAL_TABLET | Freq: Four times a day (QID) | ORAL | Status: DC | PRN

## 2024-01-20 MED ORDER — NYSTATIN 100000 UNIT/GM EX POWD
Freq: Once | CUTANEOUS | Status: DC
  Filled 2024-01-20: qty 15

## 2024-01-20 MED ORDER — INSULIN ASPART 100 UNIT/ML IJ SOLN: 0.0000 [IU] | Freq: Every day | INTRAMUSCULAR | Status: DC

## 2024-01-20 MED ORDER — ACETAMINOPHEN 650 MG RE SUPP: 650.0000 mg | Freq: Four times a day (QID) | RECTAL | Status: DC | PRN

## 2024-01-20 MED ORDER — CHLORHEXIDINE GLUCONATE CLOTH 2 % EX PADS
6.0000 | MEDICATED_PAD | Freq: Every day | CUTANEOUS | Status: DC
  Administered 2024-01-21 – 2024-01-22 (×2): 6 via TOPICAL

## 2024-01-20 MED ORDER — METRONIDAZOLE 500 MG/100ML IV SOLN
500.0000 mg | Freq: Once | INTRAVENOUS | Status: AC
  Administered 2024-01-20: 500 mg via INTRAVENOUS
  Filled 2024-01-20: qty 100

## 2024-01-20 MED ORDER — INSULIN ASPART 100 UNIT/ML IJ SOLN
0.0000 [IU] | Freq: Three times a day (TID) | INTRAMUSCULAR | Status: DC
  Administered 2024-01-20: 5 [IU] via SUBCUTANEOUS
  Administered 2024-01-21: 2 [IU] via SUBCUTANEOUS
  Administered 2024-01-21: 1 [IU] via SUBCUTANEOUS
  Administered 2024-01-21: 2 [IU] via SUBCUTANEOUS
  Administered 2024-01-22: 3 [IU] via SUBCUTANEOUS

## 2024-01-20 MED ORDER — LACTATED RINGERS IV BOLUS (SEPSIS)
1000.0000 mL | Freq: Once | INTRAVENOUS | Status: AC
  Administered 2024-01-20: 1000 mL via INTRAVENOUS

## 2024-01-20 MED ORDER — OLOPATADINE HCL 0.1 % OP SOLN
1.0000 [drp] | Freq: Two times a day (BID) | OPHTHALMIC | Status: DC
  Administered 2024-01-21 – 2024-01-22 (×2): 1 [drp] via OPHTHALMIC
  Filled 2024-01-20: qty 5

## 2024-01-20 MED ORDER — IPRATROPIUM-ALBUTEROL 0.5-2.5 (3) MG/3ML IN SOLN
3.0000 mL | Freq: Once | RESPIRATORY_TRACT | Status: AC
  Administered 2024-01-20: 3 mL via RESPIRATORY_TRACT
  Filled 2024-01-20: qty 3

## 2024-01-20 MED ORDER — SERTRALINE HCL 50 MG PO TABS
50.0000 mg | ORAL_TABLET | Freq: Every day | ORAL | Status: DC
  Administered 2024-01-21 – 2024-01-22 (×2): 50 mg via ORAL
  Filled 2024-01-20 (×2): qty 1

## 2024-01-20 MED ORDER — SODIUM CHLORIDE 0.9 % IV SOLN: INTRAVENOUS | Status: DC

## 2024-01-20 MED ORDER — LACTATED RINGERS IV SOLN: INTRAVENOUS | Status: DC

## 2024-01-20 MED ORDER — LACTULOSE 10 GM/15ML PO SOLN
10.0000 g | Freq: Three times a day (TID) | ORAL | Status: DC
  Administered 2024-01-20 – 2024-01-22 (×5): 10 g via ORAL
  Filled 2024-01-20 (×5): qty 30

## 2024-01-20 MED ORDER — SODIUM CHLORIDE 0.9 % IV BOLUS: 1000.0000 mL | Freq: Once | INTRAVENOUS | Status: DC

## 2024-01-20 MED ORDER — VANCOMYCIN HCL IN DEXTROSE 1-5 GM/200ML-% IV SOLN
1000.0000 mg | Freq: Once | INTRAVENOUS | Status: DC
  Filled 2024-01-20: qty 200

## 2024-01-20 MED ORDER — SODIUM CHLORIDE 0.9 % IV SOLN: INTRAVENOUS | Status: AC

## 2024-01-20 MED ORDER — NYSTATIN 100000 UNIT/GM EX POWD: Freq: Two times a day (BID) | CUTANEOUS | Status: DC

## 2024-01-20 MED ORDER — LEVOTHYROXINE SODIUM 100 MCG PO TABS
100.0000 ug | ORAL_TABLET | Freq: Every day | ORAL | Status: DC
  Administered 2024-01-21 – 2024-01-22 (×2): 100 ug via ORAL
  Filled 2024-01-20 (×2): qty 1

## 2024-01-20 MED ORDER — VANCOMYCIN HCL 1750 MG/350ML IV SOLN
1750.0000 mg | Freq: Once | INTRAVENOUS | Status: AC
  Administered 2024-01-20: 1750 mg via INTRAVENOUS
  Filled 2024-01-20: qty 350

## 2024-01-20 MED ORDER — MAGNESIUM SULFATE 2 GM/50ML IV SOLN
2.0000 g | Freq: Once | INTRAVENOUS | Status: AC
  Administered 2024-01-20: 2 g via INTRAVENOUS
  Filled 2024-01-20: qty 50

## 2024-01-20 MED ORDER — SODIUM CHLORIDE 0.9 % IV SOLN
2.0000 g | Freq: Once | INTRAVENOUS | Status: AC
  Administered 2024-01-20: 2 g via INTRAVENOUS
  Filled 2024-01-20: qty 12.5

## 2024-01-20 MED ORDER — ALLOPURINOL 100 MG PO TABS
100.0000 mg | ORAL_TABLET | Freq: Every day | ORAL | Status: DC
  Administered 2024-01-20 – 2024-01-22 (×3): 100 mg via ORAL
  Filled 2024-01-20 (×3): qty 1

## 2024-01-20 MED ORDER — ACETAMINOPHEN 325 MG PO TABS
650.0000 mg | ORAL_TABLET | Freq: Four times a day (QID) | ORAL | Status: DC | PRN
  Administered 2024-01-21 – 2024-01-22 (×2): 650 mg via ORAL
  Filled 2024-01-20 (×2): qty 2

## 2024-01-20 MED ORDER — ACETAMINOPHEN 325 MG PO TABS
650.0000 mg | ORAL_TABLET | Freq: Once | ORAL | Status: AC
  Administered 2024-01-20: 650 mg via ORAL
  Filled 2024-01-20: qty 2

## 2024-01-20 MED ORDER — ONDANSETRON HCL 4 MG/2ML IJ SOLN: 4.0000 mg | Freq: Four times a day (QID) | INTRAMUSCULAR | Status: DC | PRN

## 2024-01-20 NOTE — TOC Initial Note (Signed)
 Transition of Care [facility]) - Initial/Assessment Note    Patient Details  Name: Michael [name] MRN: [id number] Date of Birth: December 11, 1945  Transition of Care [facility]) CM/SW Contact:    Geraldina Klinefelter, RN Phone Number: 01/20/2024, 9:31 PM  Clinical Narrative:                 Pt admitted c/AKI, hypotension. From [facility] [place], non-ambulatory. TOC to follow.   Expected Discharge Plan: Assisted Living Barriers to Discharge: Continued Medical Work up   Patient Goals and CMS Choice   CMS Medicare.gov Compare Post Acute Care list provided to:: Patient Choice offered to / list presented to : Patient [place] ownership interest in [facility].provided to:: Patient    Expected Discharge Plan and Services In-house Referral: Clinical Social Work Discharge Planning Services: CM Consult   Living arrangements for the past 2 months: Assisted Living Facility  Prior Living Arrangements/Services Living arrangements for the past 2 months: Assisted Living Facility Lives with:: Facility Resident Patient language and need for interpreter reviewed:: Yes Do you feel safe going back to the place where you live?: Yes      Need for Family Participation in Patient Care: Yes (Comment) Care giver support system in place?: Yes (comment)   Criminal Activity/Legal Involvement Pertinent to Current Situation/Hospitalization: No - Comment as needed  Activities of Daily Living   ADL Screening (condition at time of admission) Independently performs ADLs?: No Does the patient have a NEW difficulty with bathing/dressing/toileting/self-feeding that is expected to last >3 days?: Yes (Initiates electronic notice to provider for possible OT consult) Does the patient have a NEW difficulty with getting in/out of bed, walking, or climbing stairs that is expected to last >3 days?: Yes (Initiates electronic notice to provider for possible PT consult) Does the patient have a NEW difficulty with  communication that is expected to last >3 days?: No Is the patient deaf or have difficulty hearing?: No Does the patient have difficulty seeing, even when wearing glasses/contacts?: No Does the patient have difficulty concentrating, remembering, or making decisions?: Yes  Permission Sought/Granted   Emotional Assessment   Attitude/Demeanor/Rapport: Other (comment) (Somnolent)   Alcohol / Substance Use: Not Applicable Psych Involvement: No (comment)  Admission diagnosis:  Dehydration [E86.0] Hyponatremia [E87.1] AKI (acute kidney injury) (HCC) [N17.9] Sepsis, due to unspecified organism, unspecified whether acute organ dysfunction present [facility]) [A41.9] Patient Active Problem List   Diagnosis Date Noted   Multifocal atrial tachycardia (HCC) 04/14/2023   Sepsis due to undetermined organism (HCC) 04/10/2023   Cystitis with hematuria 04/10/2023   Hypercalcemia 04/10/2023   Acute renal failure superimposed on stage 3a chronic kidney disease (HCC) 04/10/2023   UTI (urinary tract infection) 11/18/2022   AKI (acute kidney injury) (HCC) 11/18/2022   Severe sepsis (HCC) 11/18/2022   Acute metabolic encephalopathy 11/18/2022   Tonic-clonic seizure (HCC) 10/01/2022   Pressure injury of skin 10/01/2022   Paroxysmal atrial fibrillation with RVR (HCC) 10/01/2022   Venous stasis dermatitis 10/01/2022   Disorientation 09/19/2022   Hypertensive crisis 09/18/2022   Encounter for general adult medical examination with abnormal findings 06/20/2022   Onychomycosis 06/20/2022   Physical deconditioning 12/21/2021   Acute cystitis without hematuria 07/10/2021   Benign prostatic hyperplasia with urinary frequency 07/10/2021   Vitamin D  deficiency 05/23/2021   Anxiety 01/11/2021   Chronic obstructive lung disease (HCC) 01/11/2021   Contact dermatitis and other eczema 01/11/2021   Insulin  dependent type 2 diabetes mellitus (HCC) 01/11/2021   Diabetic peripheral neuropathy (  HCC) 01/11/2021   Gout  01/11/2021   History of colonic polyps 01/11/2021   Ventral hernia 01/11/2021   Neck pain 11/20/2020   Lymphedema of lower extremity 11/18/2018   Chronic pain syndrome 05/06/2018   Depression, major, single episode, mild (HCC) 06/20/2017   DNR (do not resuscitate) 06/20/2017   Chronic HFrEF (heart failure with reduced ejection fraction) (HCC) 10/23/2016   Hypothyroidism 11/30/2014   Sinus pause 07/21/2014   Difficulty walking 05/03/2014   Acute anemia 07/14/2013   Spinal stenosis of lumbar region 07/06/2013   Carpal tunnel syndrome 04/13/2013   DDD (degenerative disc disease), lumbosacral 04/13/2013   Peripheral neuropathy 01/05/2013   S/P arthroscopy of left knee 11/09/2012   Osteoarthritis of knee 10/07/2012   NASH (nonalcoholic steatohepatitis) 05/22/2007   Hyperlipidemia 10/15/2006   Class 3 obesity 10/15/2006   Hypertension 10/15/2006   Gastroesophageal reflux disease 10/15/2006   PCP:  Clinic, [facility]acy:   [facility] [place], [place] - [address] [place] [address] Phone: [phone] Fax: [phone]  [facility] - [place], [place] - [address] [place] [address] Phone: [phone] Fax: [phone]  Social Drivers of Health (SDOH) Social History: SDOH Screenings   Food Insecurity: No Food Insecurity (01/20/2024)  Housing: Low Risk  (01/20/2024)  Transportation Needs: No Transportation Needs (01/20/2024)  Utilities: Not At Risk (01/20/2024)  Alcohol Screen: Low Risk  (04/18/2022)  Depression (PHQ2-9): Low Risk  (06/20/2022)  Financial Resource Strain: Low Risk  (04/18/2022)  Physical Activity: Inactive (04/18/2022)  Social Connections: Unknown (01/20/2024)  Stress: No Stress Concern Present (04/18/2022)  Tobacco Use: Medium Risk (01/20/2024)   SDOH Interventions:   Readmission Risk Interventions    04/12/2023    5:39 PM 04/11/2023     4:24 PM 11/19/2022   11:00 AM  Readmission Risk Prevention Plan  Transportation Screening  Complete Complete  PCP or Specialist Appt within 3-5 Days  Complete   HRI or Home Care Consult -- Not Complete   Social Work Consult for Recovery Care Planning/Counseling Complete Complete   Palliative Care Screening Complete Not Applicable   Medication Review Oceanographer) Complete Complete Complete  HRI or Home Care Consult   Complete  SW Recovery Care/Counseling Consult   Complete  Palliative Care Screening   Not Applicable  Skilled Nursing Facility   Not Applicable

## 2024-01-20 NOTE — ED Provider Notes (Signed)
 [place] [facility] AT [facility] Provider Note   CSN: [id number] Arrival date & time: 01/20/24  1005     History  Chief Complaint  Patient presents with   Hypotension    Michael [name] is a 78 y.o. male.  HPI Patient presents for hypotension.  Medical history includes HLD, HTN, GERD, NASH, arthritis, anemia, CHF, hypothyroidism, depression, chronic pain, COPD, anxiety, BPH, seizures, CKD, DM.  He resides at a nursing facility.  Nursing facility noted no urine output in the past 4 days.  He has been receiving Benadryl for treatment of seasonal allergies.  He endorses a bifrontal headache but denies any other current symptoms.  Blood pressure at facility this morning was 84/47.  This was after he received his morning dose of metoprolol .  EMS noted slightly improved blood pressure.  Patient reports that he has been bedbound for several years.  He is not sure why he is no longer able to walk.  He does have a history of dementia.    Home Medications Prior to Admission medications   Medication Sig Start Date End Date Taking? Authorizing Provider  oxyCODONE  (ROXICODONE ) 15 MG immediate release tablet Take 15 mg by mouth 3 (three) times daily. 01/16/24  Yes [provider]  Oxycodone  HCl 10 MG TABS Take 10 mg by mouth 3 (three) times daily. 01/15/24  Yes [provider]  allopurinol  (ZYLOPRIM ) 100 MG tablet Take 100 mg by mouth daily.    [provider]  bumetanide  (BUMEX ) 1 MG tablet Take 1 mg by mouth See admin instructions. Take 3 tablets (3mg ) by mouth daily 11/05/22   [provider]  cefdinir  (OMNICEF ) 300 MG capsule Take 1 capsule (300 mg total) by mouth every 12 (twelve) hours. X 2 days 04/15/23   Demaris Fillers, MD  chlorhexidine  (PERIDEX ) 0.12 % solution Use as directed 15 mLs in the mouth or throat 2 (two) times daily. 04/03/23   [provider]  gabapentin  (NEURONTIN ) 400 MG capsule Take 1 capsule (400 mg total) by mouth 3  (three) times daily. 03/04/18   Mathis Som, MD  lactulose  (CHRONULAC ) 10 GM/15ML solution Take 10 g by mouth 3 (three) times daily. 02/11/23   [provider]  levETIRAcetam  (KEPPRA ) 500 MG tablet Take 1 tablet (500 mg total) by mouth 2 (two) times daily. 04/15/23   Demaris Fillers, MD  levothyroxine  (SYNTHROID ) 100 MCG tablet Take 100 mcg by mouth daily before breakfast.    [provider]  metolazone  (ZAROXOLYN ) 2.5 MG tablet Two Times Weekly on Monday and Thursday Patient taking differently: Take 2.5 mg by mouth 2 (two) times a week. Monday and Thursday 02/08/20   Laurann Pollock, MD  metoprolol  tartrate (LOPRESSOR ) 25 MG tablet Take 1 tablet (25 mg total) by mouth 2 (two) times daily. 04/15/23   Demaris Fillers, MD  Morphine  Sulfate (MORPHINE  CONCENTRATE) 10 mg / 0.5 ml concentrated solution Take 5 mg by mouth every 2 (two) hours as needed for moderate pain. 04/08/23   [provider]  pantoprazole  (PROTONIX ) 40 MG tablet Take 1 tablet (40 mg total) by mouth daily. 02/08/20   Laurann Pollock, MD  PATADAY  0.1 % ophthalmic solution Place 1 drop into both eyes 2 (two) times daily. 01/31/23   [provider]  polyethylene glycol powder (MIRALAX ) 17 GM/SCOOP powder Take 17 g by mouth 2 (two) times daily as needed for moderate constipation or mild constipation. 10/01/22   Gonfa, Taye T, MD  potassium chloride  (  KLOR-CON ) 10 MEQ tablet Take 10 mEq by mouth 2 (two) times daily.    [provider]  sacubitril -valsartan  (ENTRESTO ) 49-51 MG Take 1 tablet by mouth 2 (two) times daily. 02/08/20   Laurann Pollock, MD  sertraline  (ZOLOFT ) 50 MG tablet Take 50 mg by mouth daily.    [provider]  tamsulosin  (FLOMAX ) 0.4 MG CAPS capsule TAKE (1) CAPSULE BY MOUTH EVERY DAY. Patient taking differently: Take 0.4 mg by mouth daily. 10/10/21   Meldon Sport, MD  traZODone  (DESYREL ) 50 MG tablet Take 1 tablet (50 mg total) by mouth at bedtime. TAKE 1 TABLET BY MOUTH AT  BEDTIME AS NEEDED FOR SLEEP. Patient taking differently: Take 100 mg by mouth at bedtime. 10/01/22   Gonfa, Taye T, MD      Allergies    Patient has no known allergies.    Review of Systems   Review of Systems  Genitourinary:  Positive for decreased urine volume.  Neurological:  Positive for headaches.  All other systems reviewed and are negative.   Physical Exam Updated Vital Signs BP (!) 101/52 (BP Location: Left Arm)   Pulse 97   Temp 97.7 F (36.5 C) (Oral)   Resp 18   SpO2 97%  Physical Exam Vitals and nursing note reviewed.  Constitutional:      General: He is not in acute distress.    Appearance: Normal appearance. He is well-developed. He is not toxic-appearing or diaphoretic.  HENT:     Head: Normocephalic and atraumatic.     Right Ear: External ear normal.     Left Ear: External ear normal.     Nose: Nose normal.     Mouth/Throat:     Mouth: Mucous membranes are moist.  Eyes:     Extraocular Movements: Extraocular movements intact.     Conjunctiva/sclera: Conjunctivae normal.  Cardiovascular:     Rate and Rhythm: Normal rate and regular rhythm.     Heart sounds: No murmur heard. Pulmonary:     Effort: Pulmonary effort is normal. No respiratory distress.     Breath sounds: Wheezing present. No rales.  Chest:     Chest wall: No tenderness.  Abdominal:     General: There is distension.     Palpations: Abdomen is soft.     Tenderness: There is no abdominal tenderness. There is no guarding or rebound.     Comments: 800 cc bladder volume on bedside ultrasound.  Musculoskeletal:        General: No swelling or deformity.     Cervical back: Normal range of motion and neck supple.  Skin:    General: Skin is warm and dry.     Coloration: Skin is not jaundiced or pale.  Neurological:     General: No focal deficit present.     Mental Status: He is alert.  Psychiatric:        Mood and Affect: Mood normal.        Behavior: Behavior normal.     ED Results /  Procedures / Treatments   Labs (all labs ordered are listed, but only abnormal results are displayed) Labs Reviewed  COMPREHENSIVE METABOLIC PANEL WITH GFR - Abnormal; Notable for the following components:      Result Value   Sodium 120 (*)    Chloride 83 (*)    Glucose, Bld 230 (*)    BUN 56 (*)    Creatinine, Ser 2.48 (*)    Calcium  8.0 (*)  Total Protein 6.4 (*)    Albumin 3.4 (*)    Alkaline Phosphatase 136 (*)    GFR, Estimated 26 (*)    All other components within normal limits  CBC WITH DIFFERENTIAL/PLATELET - Abnormal; Notable for the following components:   RBC 3.56 (*)    Hemoglobin 10.3 (*)    HCT 29.4 (*)    All other components within normal limits  BRAIN NATRIURETIC PEPTIDE - Abnormal; Notable for the following components:   B Natriuretic Peptide 126.0 (*)    All other components within normal limits  BLOOD GAS, VENOUS - Abnormal; Notable for the following components:   pO2, Ven <31 (*)    All other components within normal limits  MAGNESIUM  - Abnormal; Notable for the following components:   Magnesium  1.4 (*)    All other components within normal limits  RESP PANEL BY RT-PCR (RSV, FLU A&B, COVID)  RVPGX2  CULTURE, BLOOD (ROUTINE X 2)  CULTURE, BLOOD (ROUTINE X 2)  MRSA NEXT GEN BY PCR, NASAL  LACTIC ACID, PLASMA  LACTIC ACID, PLASMA  PROTIME-INR  URINALYSIS, W/ REFLEX TO CULTURE (INFECTION SUSPECTED)  LEGIONELLA PNEUMOPHILA SEROGP 1 UR AG  SODIUM, URINE, RANDOM  OSMOLALITY, URINE  OSMOLALITY  TSH  HEMOGLOBIN A1C  TROPONIN I (HIGH SENSITIVITY)  TROPONIN I (HIGH SENSITIVITY)    EKG None  Radiology US  Venous Img Lower Unilateral Left Result Date: 01/20/2024 CLINICAL DATA:  Left lower extremity swelling. EXAM: Left LOWER EXTREMITY VENOUS DOPPLER ULTRASOUND TECHNIQUE: Gray-scale sonography with compression, as well as color and duplex ultrasound, were performed to evaluate the deep venous system(s) from the level of the common femoral vein through the  popliteal and proximal calf veins. COMPARISON:  Ultrasound 05/13/2016. FINDINGS: VENOUS Normal compressibility of the common femoral, superficial femoral, and popliteal veins, as well as the visualized calf veins. Visualized portions of profunda femoral vein and great saphenous vein unremarkable. No filling defects to suggest DVT on grayscale or color Doppler imaging. Doppler waveforms show normal direction of venous flow, normal respiratory plasticity and response to augmentation. Limited views of the contralateral common femoral vein are unremarkable. OTHER None. Limitations: none IMPRESSION: No evidence of left lower extremity DVT. Electronically Signed   By: Adrianna Horde M.D.   On: 01/20/2024 15:47   CT CHEST ABDOMEN PELVIS WO CONTRAST Result Date: 01/20/2024 CLINICAL DATA:  Hypotension with concern for sepsis. EXAM: CT CHEST, ABDOMEN AND PELVIS WITHOUT CONTRAST TECHNIQUE: Multidetector CT imaging of the chest, abdomen and pelvis was performed following the standard protocol without IV contrast. RADIATION DOSE REDUCTION: This exam was performed according to the departmental dose-optimization program which includes automated exposure control, adjustment of the mA and/or kV according to patient size and/or use of iterative reconstruction technique. COMPARISON:  CT chest, abdomen, and pelvis dated 04/10/2023. FINDINGS: CT CHEST FINDINGS Cardiovascular: Normal heart size. No pericardial effusion. Thoracic aorta is normal in caliber with atherosclerotic calcification. Multivessel coronary artery calcifications. Mediastinum/Nodes: No enlarged mediastinal, hilar, or axillary lymph nodes. Thyroid  gland, trachea, and esophagus demonstrate no significant findings. Lungs/Pleura: Motion degradation limits evaluation, most notably at the lung bases. No focal consolidation, pleural effusion, or pneumothorax. Musculoskeletal: No acute osseous abnormality. No suspicious osseous lesion. Multilevel degenerative changes of the  thoracic spine with similar anterior wedge deformity of T1 and grade 1 anterolisthesis of C7 on T1. CT ABDOMEN PELVIS FINDINGS Hepatobiliary: Similar mild hepatic contour nodularity. No suspicious focal lesion identified within the limits of an unenhanced exam. Gallbladder is unremarkable. No biliary dilatation. Pancreas: No  focal lesions or main ductal dilation. Spleen: Normal in size without focal abnormality. Adrenals/Urinary Tract: Adrenal glands are unremarkable. No urolithiasis or hydronephrosis. Similar left-greater-than-right nonspecific perinephric stranding. Bladder is collapsed with Foley catheter in place and circumferential wall thickening. Stomach/Bowel: Stomach is within normal limits. Appendix appears normal. No evidence of bowel wall thickening, distention, or inflammatory changes. Colonic diverticulosis without evidence of acute diverticulitis. Vascular/Lymphatic: Aortic atherosclerosis. No enlarged abdominal or pelvic lymph nodes. Reproductive: Unremarkable. Other: No abdominopelvic ascites. No intraperitoneal free air. Fat containing lower ventral abdominal wall hernia. Musculoskeletal: Severe degenerative changes of the bilateral hips with complete joint space loss, morphological flattening of the bilateral femoral heads, and extensive subchondral cystic changes. Multilevel degenerative changes of the thoracic spine. No acute osseous abnormality. IMPRESSION: 1. Bladder is collapsed with Foley catheter in place and circumferential wall thickening, which may be secondary to underdistention or cystitis. Similar left-greater-than-right nonspecific perinephric stranding could be secondary to chronic changes or ascending infection. 2. Colonic diverticulosis without evidence of acute diverticulitis. 3.  Aortic Atherosclerosis (ICD10-I70.0). 4. Additional unchanged chronic findings, as described above. Electronically Signed   By: Mannie Seek M.D.   On: 01/20/2024 14:06   DG Chest Port 1  View Result Date: 01/20/2024 CLINICAL DATA:  Hypotension. EXAM: PORTABLE CHEST 1 VIEW COMPARISON:  April 10, 2023. FINDINGS: The heart size and mediastinal contours are within normal limits. Both lungs are clear. The visualized skeletal structures are unremarkable. IMPRESSION: No active disease. Electronically Signed   By: Rosalene Colon M.D.   On: 01/20/2024 12:13    Procedures Procedures    Medications Ordered in ED Medications  nystatin  (MYCOSTATIN /NYSTOP ) topical powder (has no administration in time range)  Chlorhexidine  Gluconate Cloth 2 % PADS 6 each (has no administration in time range)  allopurinol  (ZYLOPRIM ) tablet 100 mg (has no administration in time range)  sertraline  (ZOLOFT ) tablet 50 mg (has no administration in time range)  levothyroxine  (SYNTHROID ) tablet 100 mcg (has no administration in time range)  tamsulosin  (FLOMAX ) capsule 0.4 mg (has no administration in time range)  pantoprazole  (PROTONIX ) EC tablet 40 mg (has no administration in time range)  lactulose  (CHRONULAC ) 10 GM/15ML solution 10 g (has no administration in time range)  levETIRAcetam  (KEPPRA ) tablet 500 mg (has no administration in time range)  olopatadine  (PATANOL) 0.1 % ophthalmic solution 1 drop (has no administration in time range)  heparin  injection 5,000 Units (has no administration in time range)  acetaminophen  (TYLENOL ) tablet 650 mg (has no administration in time range)    Or  acetaminophen  (TYLENOL ) suppository 650 mg (has no administration in time range)  ondansetron  (ZOFRAN ) tablet 4 mg (has no administration in time range)    Or  ondansetron  (ZOFRAN ) injection 4 mg (has no administration in time range)  insulin  aspart (novoLOG ) injection 0-9 Units (has no administration in time range)  insulin  aspart (novoLOG ) injection 0-5 Units (has no administration in time range)  0.9 %  sodium chloride  infusion (has no administration in time range)  ceFEPIme  (MAXIPIME ) 2 g in sodium chloride  0.9 % 100  mL IVPB (0 g Intravenous Stopped 01/20/24 1305)  metroNIDAZOLE  (FLAGYL ) IVPB 500 mg (0 mg Intravenous Stopped 01/20/24 1305)  lactated ringers  bolus 1,000 mL (0 mLs Intravenous Stopped 01/20/24 1306)  ipratropium-albuterol  (DUONEB) 0.5-2.5 (3) MG/3ML nebulizer solution 3 mL (3 mLs Nebulization Given 01/20/24 1150)  acetaminophen  (TYLENOL ) tablet 650 mg (650 mg Oral Given 01/20/24 1143)  vancomycin  (VANCOREADY) IVPB 1750 mg/350 mL (0 mg Intravenous Stopped 01/20/24 1416)  magnesium  sulfate  IVPB 2 g 50 mL (0 g Intravenous Stopped 01/20/24 1503)    ED Course/ Medical Decision Making/ A&P                                 Medical Decision Making Amount and/or Complexity of Data Reviewed Labs: ordered. Radiology: ordered.  Risk OTC drugs. Prescription drug management. Decision regarding hospitalization.   This patient presents to the ED for concern of hypotension, this involves an extensive number of treatment options, and is a complaint that carries with it a high risk of complications and morbidity.  The differential diagnosis includes polypharmacy, sepsis, dehydration, arrhythmia, cardiogenic shock   Co morbidities that complicate the patient evaluation  HLD, HTN, GERD, NASH, arthritis, anemia, CHF, hypothyroidism, depression, chronic pain, COPD, anxiety, BPH, seizures, CKD, DM   Additional history obtained:  Additional history obtained from EMS, patient's niece External records from outside source obtained and reviewed including EMR   Lab Tests:  I Ordered, and personally interpreted labs.  The pertinent results include: Serum lab work notable for AKI, azotemia, hyponatremia, hypomagnesemia.  No leukocytosis is present.  Anemia is baseline.   Imaging Studies ordered:  I ordered imaging studies including chest x-ray, CT of chest, abdomen, pelvis, left leg DVT study I independently visualized and interpreted imaging which showed chronic left greater than right perinephric stranding,  no acute findings I agree with the radiologist interpretation   Cardiac Monitoring: / EKG:  The patient was maintained on a cardiac monitor.  I personally viewed and interpreted the cardiac monitored which showed an underlying rhythm of: Sinus rhythm   Problem List / ED Course / Critical interventions / Medication management  Patient presenting for hypotension, identified at a skilled nursing facility this morning.  On arrival, patient is awake and alert.  He endorses a mild bifrontal headache but denies any other current symptoms.  Nursing facility reported no urine output in the past several days.  He does have 800 cc of bladder volume on bedside ultrasound.  He has been getting frequent doses of Benadryl lately for treatment of seasonal allergies.  This may have induced a urine retention.  Foley catheter to be placed.  On rectal temperature, patient has a low-grade fever 100.4 degrees.  This in commendation with his hypotension is suggestive of sepsis.  Septic workup and treatment were initiated.  Patient also has mild expiratory wheezing on lung auscultation.  DuoNeb was ordered.  Lab work notable for increase in creatinine from baseline, hyponatremia, hypomagnesemia.  Replacement magnesium  was ordered.  Patient's blood pressures remain soft.  I spoke with his niece who joined him at bedside.  He is currently on hospice.  She states that she saw him on Sunday and he seemed fine at the time, although he does have some good days and some bad.  His CT scan showed concern of possible ascending urine infection.  Despite that, his urinalysis today does not show evidence of infection.  Patient may have bacterial infection superimposed on the fungal intertriginous rash.  There are no other significant skin findings.  Patient was admitted for further management. I ordered medication including IV fluids and broad-spectrum antibiotics for empiric treatment of sepsis; nystatin  powder for intertriginous rash  Tylenol  for antipyresis Reevaluation of the patient after these medicines showed that the patient improved I have reviewed the patients home medicines and have made adjustments as needed   Social Determinants of Health:  Resides in  nursing facility.  Currently on hospice.  CRITICAL CARE Performed by: Iva Mariner   Total critical care time: 34 minutes  Critical care time was exclusive of separately billable procedures and treating other patients.  Critical care was necessary to treat or prevent imminent or life-threatening deterioration.  Critical care was time spent personally by me on the following activities: development of treatment plan with patient and/or surrogate as well as nursing, discussions with consultants, evaluation of patient's response to treatment, examination of patient, obtaining history from patient or surrogate, ordering and performing treatments and interventions, ordering and review of laboratory studies, ordering and review of radiographic studies, pulse oximetry and re-evaluation of patient's condition.         Final Clinical Impression(s) / ED Diagnoses Final diagnoses:  Dehydration  Hyponatremia  AKI (acute kidney injury) (HCC)  Sepsis, due to unspecified organism, unspecified whether acute organ dysfunction present [facility])    Rx / DC Orders ED Discharge Orders     None         Iva Mariner, MD 01/20/24 1600

## 2024-01-20 NOTE — ED Triage Notes (Signed)
 Pt BIB [facility] from [facility] for c/o hypotension  The facility had a BP of 84/47  EMS 91/57 HR 94  Cbg 248  Pt has dementia   Pt c/o headache

## 2024-01-20 NOTE — ED Notes (Signed)
 Noticed infiltration of IV on left arm after pt had returned from scans. Immediately d/c'd the lactated Ringer 's running in it, removed the IV and wrapped with cohesive bandage.

## 2024-01-20 NOTE — H&P (Signed)
 History and Physical    Michael [name] UJW:[id number] DOB: [birth year] DOA: 01/20/2024  PCP: [facility]   Patient coming from: SNF  Chief Complaint: Hypotension  HPI: Michael [name] is a 78 y.o. male with medical history significant for combined CHF, seizures, NASH, type 2 diabetes, hypertension, hypothyroidism, dementia, and venous stasis dermatitis who presented from [facility] with hypotension.  He is typically wheelchair-bound and niece at bedside states that he was having some headaches this morning and difficulty urinating as well.  His facility could not collect an adequate urine sample and they feared that he may have UTI.  They sent him to the ED for further evaluation and workup.  Apparently, he has been under hospice care due to his heart failure over the last 1 year.   ED Course: Vital signs with some soft blood pressure readings and temperature 100.4.  Noted to have hemoglobin levels 10.3 and sodium 120.  Creatinine 2.48 and BUN 56.  Glucose 230, BNP 126, magnesium  1.4.  CT imaging with no significant findings noted and urine analysis within normal limits.  Chest x-ray within normal limits.  Blood cultures ordered and pending and patient received some IV antibiotics on admission.  Foley placed in ED.  Review of Systems: Reviewed as noted above, otherwise negative.  Past Medical History:  Diagnosis Date   Arthritis    Congestive heart disease (HCC) 11/2015   COPD (chronic obstructive pulmonary disease) (HCC)    DDD (degenerative disc disease), lumbosacral    Demand ischemia (HCC) 09/23/2019   Depression    Diabetic neuropathy (HCC)    Diastolic dysfunction    Essential hypertension    GERD (gastroesophageal reflux disease)    Hyperlipidemia    Iron deficiency anemia    Left knee DJD    Lumbar spinal stenosis    Lymphedema 11/2015   BOTH LEGS   NASH (nonalcoholic steatohepatitis)    Peripheral edema    Peripheral edema 10/05/2015   Type 2 diabetes mellitus  (HCC)     Past Surgical History:  Procedure Laterality Date   BIOPSY N/A 07/21/2014   Procedure: BIOPSY;  Surgeon: Suzette Espy, MD;  Location: [facility];  Service: Endoscopy;  Laterality: N/A;   CATARACT EXTRACTION W/PHACO Right 07/08/2016   Procedure: CATARACT EXTRACTION PHACO AND INTRAOCULAR LENS PLACEMENT RIGHT EYE;  Surgeon: Anner Kill, MD;  Location: [facility];  Service: Ophthalmology;  Laterality: Right;  CDE: 9.01   CATARACT EXTRACTION W/PHACO Left 07/22/2016   Procedure: CATARACT EXTRACTION PHACO AND INTRAOCULAR LENS PLACEMENT LEFT EYE CDE=10.69;  Surgeon: Anner Kill, MD;  Location: [facility];  Service: Ophthalmology;  Laterality: Left;  left -    COLONOSCOPY  2011   [facility]: normal colon, normal distal ileum   COLONOSCOPY WITH PROPOFOL  N/A 07/21/2014   Procedure: ATTEMPTED COLONOSCOPY WITH PROPOFOL -HAD TO STOP DUE TO BRADYCARDIA;  Surgeon: Suzette Espy, MD;  Location: [facility];  Service: Endoscopy;  Laterality: N/A;   EGD with enteroscopy  2011   [facility]: normal esophagus and stomach. Normal duodenum, jejunum. No evidence of AVMs.    ESOPHAGOGASTRODUODENOSCOPY (EGD) WITH PROPOFOL  N/A 07/21/2014   Procedure: ESOPHAGOGASTRODUODENOSCOPY (EGD) WITH PROPOFOL ;  Surgeon: Suzette Espy, MD;  Location: [facility];  Service: Endoscopy;  Laterality: N/A;   HERNIA REPAIR     KNEE ARTHROSCOPY WITH MEDIAL MENISECTOMY Left 11/06/2012   Procedure: KNEE ARTHROSCOPY WITH MEDIAL MENISECTOMY;  Surgeon: Darrin Emerald, MD;  Location: [facility];  Service: Orthopedics;  Laterality: Left;  Lipoma removal     Stomach   LUMBAR LAMINECTOMY/DECOMPRESSION MICRODISCECTOMY Left 08/30/2013   Procedure: LUMBAR LAMINECTOMY/DECOMPRESSION MICRODISCECTOMY LEFT  LUMBAR TWO THREE;  Surgeon: Shary Deems, MD;  Location: [facility];  Service: Neurosurgery;  Laterality: Left;   RIGHT/LEFT HEART CATH AND CORONARY ANGIOGRAPHY N/A 09/22/2019   Procedure: RIGHT/LEFT HEART CATH AND CORONARY ANGIOGRAPHY;  Surgeon: [place]azil[place], Peter M,  MD;  Location: [facility] INVASIVE CV LAB;  Service: Cardiovascular;  Laterality: N/A;   SHOULDER SURGERY     Rght-rotator cuff   TOOTH EXTRACTION       reports that he quit smoking about 24 years ago. His smoking use included cigarettes. He started smoking about 68 years ago. He has a 111 pack-year smoking history. He has never used smokeless tobacco. He reports that he does not drink alcohol and does not use drugs.  No Known Allergies  Family History  Problem Relation Age of Onset   Heart disease Mother    Hyperlipidemia Mother    Hypertension Mother    Depression Mother    Diabetes Mother    Rectal cancer Mother    Cancer Mother    Heart disease Father    Hypertension Father    Hyperlipidemia Father    Diabetes Father    Cancer Father    Heart disease Sister    Hyperlipidemia Sister    Hypertension Sister    Diabetes Sister    Diabetes Brother    Heart disease Sister    Hyperlipidemia Sister    Hypertension Sister    Heart disease Sister    Hyperlipidemia Sister    Hypertension Sister    Diabetes Sister    Diabetes Brother     Prior to Admission medications   Medication Sig Start Date End Date Taking? Authorizing Provider  allopurinol  (ZYLOPRIM ) 100 MG tablet Take 100 mg by mouth daily.    [provider]  bumetanide  (BUMEX ) 1 MG tablet Take 1 mg by mouth See admin instructions. Take 3 tablets (3mg ) by mouth daily 11/05/22   [provider]  cefdinir  (OMNICEF ) 300 MG capsule Take 1 capsule (300 mg total) by mouth every 12 (twelve) hours. X 2 days 04/15/23   Demaris Fillers, MD  chlorhexidine  (PERIDEX ) 0.12 % solution Use as directed 15 mLs in the mouth or throat 2 (two) times daily. 04/03/23   [provider]  gabapentin  (NEURONTIN ) 400 MG capsule Take 1 capsule (400 mg total) by mouth 3 (three) times daily. 03/04/18   Mathis Som, MD  lactulose  (CHRONULAC ) 10 GM/15ML solution Take 10 g by mouth 3 (three) times daily. 02/11/23   [provider]   levETIRAcetam  (KEPPRA ) 500 MG tablet Take 1 tablet (500 mg total) by mouth 2 (two) times daily. 04/15/23   Demaris Fillers, MD  levothyroxine  (SYNTHROID ) 100 MCG tablet Take 100 mcg by mouth daily before breakfast.    [provider]  metolazone  (ZAROXOLYN ) 2.5 MG tablet Two Times Weekly on Monday and Thursday Patient taking differently: Take 2.5 mg by mouth 2 (two) times a week. Monday and Thursday 02/08/20   Laurann Pollock, MD  metoprolol  tartrate (LOPRESSOR ) 25 MG tablet Take 1 tablet (25 mg total) by mouth 2 (two) times daily. 04/15/23   Demaris Fillers, MD  Morphine  Sulfate (MORPHINE  CONCENTRATE) 10 mg / 0.5 ml concentrated solution Take 5 mg by mouth every 2 (two) hours as needed for moderate pain. 04/08/23   [provider]  pantoprazole  (PROTONIX ) 40 MG tablet Take 1 tablet (  40 mg total) by mouth daily. 02/08/20   Laurann Pollock, MD  PATADAY  0.1 % ophthalmic solution Place 1 drop into both eyes 2 (two) times daily. 01/31/23   [provider]  polyethylene glycol powder (MIRALAX ) 17 GM/SCOOP powder Take 17 g by mouth 2 (two) times daily as needed for moderate constipation or mild constipation. 10/01/22   Gonfa, Taye T, MD  potassium chloride  (KLOR-CON ) 10 MEQ tablet Take 10 mEq by mouth 2 (two) times daily.    [provider]  sacubitril -valsartan  (ENTRESTO ) 49-51 MG Take 1 tablet by mouth 2 (two) times daily. 02/08/20   Laurann Pollock, MD  sertraline  (ZOLOFT ) 50 MG tablet Take 50 mg by mouth daily.    [provider]  tamsulosin  (FLOMAX ) 0.4 MG CAPS capsule TAKE (1) CAPSULE BY MOUTH EVERY DAY. Patient taking differently: Take 0.4 mg by mouth daily. 10/10/21   Meldon Sport, MD  traZODone  (DESYREL ) 50 MG tablet Take 1 tablet (50 mg total) by mouth at bedtime. TAKE 1 TABLET BY MOUTH AT BEDTIME AS NEEDED FOR SLEEP. Patient taking differently: Take 100 mg by mouth at bedtime. 10/01/22   Theadore Finger, MD    Physical Exam: Vitals:   01/20/24 1230 01/20/24  1300 01/20/24 1412 01/20/24 1502  BP: 106/70 (!) 108/50 (!) 105/48 (!) 101/52  Pulse: 93 97 95 97  Resp: 12 16  18   Temp:    97.7 F (36.5 C)  TempSrc:    Oral  SpO2: 99% 99% 97% 97%    Constitutional: NAD, calm, comfortable Vitals:   01/20/24 1230 01/20/24 1300 01/20/24 1412 01/20/24 1502  BP: 106/70 (!) 108/50 (!) 105/48 (!) 101/52  Pulse: 93 97 95 97  Resp: 12 16  18   Temp:    97.7 F (36.5 C)  TempSrc:    Oral  SpO2: 99% 99% 97% 97%   Eyes: lids and conjunctivae normal Neck: normal, supple Respiratory: clear to auscultation bilaterally. Normal respiratory effort. No accessory muscle use.  Cardiovascular: Regular rate and rhythm, no murmurs. Abdomen: no tenderness, no distention. Bowel sounds positive.  Musculoskeletal:  No edema. Skin: no rashes, lesions, ulcers.  Psychiatric: Flat affect Foley with clear, yellow urine output  Labs on Admission: I have personally reviewed following labs and imaging studies  CBC: Recent Labs  Lab 01/20/24 1128  WBC 6.9  NEUTROABS 5.4  HGB 10.3*  HCT 29.4*  MCV 82.6  PLT 169   Basic Metabolic Panel: Recent Labs  Lab 01/20/24 1128  NA 120*  K 4.1  CL 83*  CO2 24  GLUCOSE 230*  BUN 56*  CREATININE 2.48*  CALCIUM  8.0*  MG 1.4*   GFR: CrCl cannot be calculated (Unknown ideal weight.). Liver Function Tests: Recent Labs  Lab 01/20/24 1128  AST 21  ALT 15  ALKPHOS 136*  BILITOT 0.6  PROT 6.4*  ALBUMIN 3.4*   No results for input(s): "LIPASE", "AMYLASE" in the last 168 hours. No results for input(s): "AMMONIA" in the last 168 hours. Coagulation Profile: Recent Labs  Lab 01/20/24 1128  INR 1.0   Cardiac Enzymes: No results for input(s): "CKTOTAL", "CKMB", "CKMBINDEX", "TROPONINI" in the last 168 hours. BNP (last 3 results) No results for input(s): "PROBNP" in the last 8760 hours. HbA1C: No results for input(s): "HGBA1C" in the last 72 hours. CBG: No results for input(s): "GLUCAP" in the last 168  hours. Lipid Profile: No results for input(s): "CHOL", "HDL", "LDLCALC", "TRIG", "CHOLHDL", "LDLDIRECT" in the last 72 hours. Thyroid  Function  Tests: No results for input(s): "TSH", "T4TOTAL", "FREET4", "T3FREE", "THYROIDAB" in the last 72 hours. Anemia Panel: No results for input(s): "VITAMINB12", "FOLATE", "FERRITIN", "TIBC", "IRON", "RETICCTPCT" in the last 72 hours. Urine analysis:    Component Value Date/Time   COLORURINE YELLOW 01/20/2024 1041   APPEARANCEUR CLEAR 01/20/2024 1041   APPEARANCEUR Clear 12/21/2021 1054   LABSPEC 1.009 01/20/2024 1041   PHURINE 5.0 01/20/2024 1041   GLUCOSEU NEGATIVE 01/20/2024 1041   HGBUR NEGATIVE 01/20/2024 1041   BILIRUBINUR NEGATIVE 01/20/2024 1041   BILIRUBINUR negative 04/25/2022 0857   BILIRUBINUR Negative 12/21/2021 1054   KETONESUR NEGATIVE 01/20/2024 1041   PROTEINUR NEGATIVE 01/20/2024 1041   UROBILINOGEN 0.2 04/25/2022 0857   UROBILINOGEN 0.2 08/24/2013 1129   NITRITE NEGATIVE 01/20/2024 1041   LEUKOCYTESUR NEGATIVE 01/20/2024 1041    Radiological Exams on Admission: CT CHEST ABDOMEN PELVIS WO CONTRAST Result Date: 01/20/2024 CLINICAL DATA:  Hypotension with concern for sepsis. EXAM: CT CHEST, ABDOMEN AND PELVIS WITHOUT CONTRAST TECHNIQUE: Multidetector CT imaging of the chest, abdomen and pelvis was performed following the standard protocol without IV contrast. RADIATION DOSE REDUCTION: This exam was performed according to the departmental dose-optimization program which includes automated exposure control, adjustment of the mA and/or kV according to patient size and/or use of iterative reconstruction technique. COMPARISON:  CT chest, abdomen, and pelvis dated 04/10/2023. FINDINGS: CT CHEST FINDINGS Cardiovascular: Normal heart size. No pericardial effusion. Thoracic aorta is normal in caliber with atherosclerotic calcification. Multivessel coronary artery calcifications. Mediastinum/Nodes: No enlarged mediastinal, hilar, or axillary  lymph nodes. Thyroid  gland, trachea, and esophagus demonstrate no significant findings. Lungs/Pleura: Motion degradation limits evaluation, most notably at the lung bases. No focal consolidation, pleural effusion, or pneumothorax. Musculoskeletal: No acute osseous abnormality. No suspicious osseous lesion. Multilevel degenerative changes of the thoracic spine with similar anterior wedge deformity of T1 and grade 1 anterolisthesis of C7 on T1. CT ABDOMEN PELVIS FINDINGS Hepatobiliary: Similar mild hepatic contour nodularity. No suspicious focal lesion identified within the limits of an unenhanced exam. Gallbladder is unremarkable. No biliary dilatation. Pancreas: No focal lesions or main ductal dilation. Spleen: Normal in size without focal abnormality. Adrenals/Urinary Tract: Adrenal glands are unremarkable. No urolithiasis or hydronephrosis. Similar left-greater-than-right nonspecific perinephric stranding. Bladder is collapsed with Foley catheter in place and circumferential wall thickening. Stomach/Bowel: Stomach is within normal limits. Appendix appears normal. No evidence of bowel wall thickening, distention, or inflammatory changes. Colonic diverticulosis without evidence of acute diverticulitis. Vascular/Lymphatic: Aortic atherosclerosis. No enlarged abdominal or pelvic lymph nodes. Reproductive: Unremarkable. Other: No abdominopelvic ascites. No intraperitoneal free air. Fat containing lower ventral abdominal wall hernia. Musculoskeletal: Severe degenerative changes of the bilateral hips with complete joint space loss, morphological flattening of the bilateral femoral heads, and extensive subchondral cystic changes. Multilevel degenerative changes of the thoracic spine. No acute osseous abnormality. IMPRESSION: 1. Bladder is collapsed with Foley catheter in place and circumferential wall thickening, which may be secondary to underdistention or cystitis. Similar left-greater-than-right nonspecific perinephric  stranding could be secondary to chronic changes or ascending infection. 2. Colonic diverticulosis without evidence of acute diverticulitis. 3.  Aortic Atherosclerosis (ICD10-I70.0). 4. Additional unchanged chronic findings, as described above. Electronically Signed   By: Mannie Seek M.D.   On: 01/20/2024 14:06   DG Chest Port 1 View Result Date: 01/20/2024 CLINICAL DATA:  Hypotension. EXAM: PORTABLE CHEST 1 VIEW COMPARISON:  April 10, 2023. FINDINGS: The heart size and mediastinal contours are within normal limits. Both lungs are clear. The visualized skeletal structures are unremarkable. IMPRESSION: No  active disease. Electronically Signed   By: Rosalene Colon M.D.   On: 01/20/2024 12:13    EKG: Independently reviewed.  A-fib 92 bpm.  Assessment/Plan Principal Problem:   AKI (acute kidney injury) (HCC) Active Problems:   Hyperlipidemia   Gastroesophageal reflux disease   Hypothyroidism   DNR (do not resuscitate)   Chronic pain syndrome   Anxiety   Chronic obstructive lung disease (HCC)   Gout   Benign prostatic hyperplasia with urinary frequency   Acute renal failure superimposed on stage 3a chronic kidney disease (HCC)    AKI on CKD stage IIIa likely secondary to dehydration with hypotension - Baseline creatinine near 1.0-1.4 - Presents with creatinine 2.48 - Hold home diuretics - Continue gentle IV fluid hydration  Hyponatremia secondary to above - Fluid repletion with IV normal saline and monitor closely -Further evaluation with TSH, urine and serum osmolarity - Avoid diuretics  Hypothyroidism - Continue Synthroid , check TSH as above  History of SVT/atrial tachycardia - Hold metoprolol  and monitor on telemetry given hypotension  Chronic HFimpEF - Prior 2D echocardiogram 09/23/2022 with EF 50-55% and grade 2 diastolic dysfunction - Hold Entresto  and metoprolol  given hypotension  Type 2 diabetes - SSI and carb modified diet  Dyslipidemia - Continue  statin  Diabetic polyneuropathy - Hold gabapentin  in setting of AKI  Seizure disorder - Continue Keppra   Acute urinary retention secondary to BPH - Status post Foley catheter placement in the ED   DVT prophylaxis: Heparin  Code Status: DNR Family Communication: Niece at bedside Disposition Plan: Admit for IV fluid hydration and monitoring of blood pressures Consults called: None Admission status: Observation, Telemetry  Severity of Illness: The appropriate patient status for this patient is OBSERVATION. Observation status is judged to be reasonable and necessary in order to provide the required intensity of service to ensure the patient's safety. The patient's presenting symptoms, physical exam findings, and initial radiographic and laboratory data in the context of their medical condition is felt to place them at decreased risk for further clinical deterioration. Furthermore, it is anticipated that the patient will be medically stable for discharge from the hospital within 2 midnights of admission.    Kimala Horne D Mason Sole DO [facility]  If 7PM-7AM, please contact night-coverage [email or web address]  01/20/2024, 3:09 PM

## 2024-01-20 NOTE — Discharge Instructions (Signed)
 Your [facility] notification ID is: [id number]

## 2024-01-21 DIAGNOSIS — E871 Hypo-osmolality and hyponatremia: Secondary | ICD-10-CM | POA: Diagnosis present

## 2024-01-21 DIAGNOSIS — E782 Mixed hyperlipidemia: Secondary | ICD-10-CM | POA: Diagnosis not present

## 2024-01-21 DIAGNOSIS — E785 Hyperlipidemia, unspecified: Secondary | ICD-10-CM | POA: Diagnosis present

## 2024-01-21 DIAGNOSIS — J449 Chronic obstructive pulmonary disease, unspecified: Secondary | ICD-10-CM

## 2024-01-21 DIAGNOSIS — K7581 Nonalcoholic steatohepatitis (NASH): Secondary | ICD-10-CM | POA: Diagnosis present

## 2024-01-21 DIAGNOSIS — G40909 Epilepsy, unspecified, not intractable, without status epilepticus: Secondary | ICD-10-CM

## 2024-01-21 DIAGNOSIS — I959 Hypotension, unspecified: Secondary | ICD-10-CM | POA: Diagnosis present

## 2024-01-21 DIAGNOSIS — F0394 Unspecified dementia, unspecified severity, with anxiety: Secondary | ICD-10-CM | POA: Diagnosis present

## 2024-01-21 DIAGNOSIS — N179 Acute kidney failure, unspecified: Secondary | ICD-10-CM | POA: Diagnosis present

## 2024-01-21 DIAGNOSIS — Z794 Long term (current) use of insulin: Secondary | ICD-10-CM | POA: Diagnosis not present

## 2024-01-21 DIAGNOSIS — N1831 Chronic kidney disease, stage 3a: Secondary | ICD-10-CM | POA: Diagnosis present

## 2024-01-21 DIAGNOSIS — I130 Hypertensive heart and chronic kidney disease with heart failure and stage 1 through stage 4 chronic kidney disease, or unspecified chronic kidney disease: Secondary | ICD-10-CM | POA: Diagnosis present

## 2024-01-21 DIAGNOSIS — I5032 Chronic diastolic (congestive) heart failure: Secondary | ICD-10-CM | POA: Diagnosis present

## 2024-01-21 DIAGNOSIS — M109 Gout, unspecified: Secondary | ICD-10-CM | POA: Diagnosis present

## 2024-01-21 DIAGNOSIS — E876 Hypokalemia: Secondary | ICD-10-CM | POA: Diagnosis present

## 2024-01-21 DIAGNOSIS — E1142 Type 2 diabetes mellitus with diabetic polyneuropathy: Secondary | ICD-10-CM | POA: Diagnosis present

## 2024-01-21 DIAGNOSIS — Z66 Do not resuscitate: Secondary | ICD-10-CM | POA: Diagnosis present

## 2024-01-21 DIAGNOSIS — N401 Enlarged prostate with lower urinary tract symptoms: Secondary | ICD-10-CM | POA: Diagnosis present

## 2024-01-21 DIAGNOSIS — E1121 Type 2 diabetes mellitus with diabetic nephropathy: Secondary | ICD-10-CM | POA: Diagnosis not present

## 2024-01-21 DIAGNOSIS — K219 Gastro-esophageal reflux disease without esophagitis: Secondary | ICD-10-CM | POA: Diagnosis present

## 2024-01-21 DIAGNOSIS — N138 Other obstructive and reflux uropathy: Secondary | ICD-10-CM | POA: Diagnosis present

## 2024-01-21 DIAGNOSIS — E039 Hypothyroidism, unspecified: Secondary | ICD-10-CM | POA: Diagnosis present

## 2024-01-21 DIAGNOSIS — E038 Other specified hypothyroidism: Secondary | ICD-10-CM

## 2024-01-21 DIAGNOSIS — G894 Chronic pain syndrome: Secondary | ICD-10-CM | POA: Diagnosis present

## 2024-01-21 DIAGNOSIS — E860 Dehydration: Secondary | ICD-10-CM | POA: Diagnosis present

## 2024-01-21 DIAGNOSIS — E1122 Type 2 diabetes mellitus with diabetic chronic kidney disease: Secondary | ICD-10-CM | POA: Diagnosis present

## 2024-01-21 LAB — CBC
HCT: 25.3 % — ABNORMAL LOW (ref 39.0–52.0)
Hemoglobin: 9 g/dL — ABNORMAL LOW (ref 13.0–17.0)
MCH: 29.4 pg (ref 26.0–34.0)
MCHC: 35.6 g/dL (ref 30.0–36.0)
MCV: 82.7 fL (ref 80.0–100.0)
Platelets: 133 10*3/uL — ABNORMAL LOW (ref 150–400)
RBC: 3.06 MIL/uL — ABNORMAL LOW (ref 4.22–5.81)
RDW: 13.9 % (ref 11.5–15.5)
WBC: 4.2 10*3/uL (ref 4.0–10.5)
nRBC: 0 % (ref 0.0–0.2)

## 2024-01-21 LAB — BASIC METABOLIC PANEL WITH GFR
Anion gap: 11 (ref 5–15)
BUN: 51 mg/dL — ABNORMAL HIGH (ref 8–23)
CO2: 25 mmol/L (ref 22–32)
Calcium: 8 mg/dL — ABNORMAL LOW (ref 8.9–10.3)
Chloride: 89 mmol/L — ABNORMAL LOW (ref 98–111)
Creatinine, Ser: 2.22 mg/dL — ABNORMAL HIGH (ref 0.61–1.24)
GFR, Estimated: 30 mL/min — ABNORMAL LOW (ref >60)
Glucose, Bld: 166 mg/dL — ABNORMAL HIGH (ref 70–99)
Potassium: 3.4 mmol/L — ABNORMAL LOW (ref 3.5–5.1)
Sodium: 125 mmol/L — ABNORMAL LOW (ref 135–145)

## 2024-01-21 LAB — GLUCOSE, CAPILLARY
Glucose-Capillary: 153 mg/dL — ABNORMAL HIGH (ref 70–99)
Glucose-Capillary: 158 mg/dL — ABNORMAL HIGH (ref 70–99)
Glucose-Capillary: 144 mg/dL — ABNORMAL HIGH (ref 70–99)
Glucose-Capillary: 144 mg/dL — ABNORMAL HIGH (ref 70–99)

## 2024-01-21 LAB — MAGNESIUM: Magnesium: 1.6 mg/dL — ABNORMAL LOW (ref 1.7–2.4)

## 2024-01-21 LAB — OSMOLALITY, URINE: Osmolality, Ur: 271 mosm/kg — ABNORMAL LOW (ref 300–900)

## 2024-01-21 LAB — MRSA NEXT GEN BY PCR, NASAL: MRSA by PCR Next Gen: NOT DETECTED

## 2024-01-21 MED ORDER — SODIUM CHLORIDE 0.9 % IV SOLN: INTRAVENOUS | Status: AC

## 2024-01-21 MED ORDER — POTASSIUM CHLORIDE CRYS ER 20 MEQ PO TBCR
40.0000 meq | EXTENDED_RELEASE_TABLET | Freq: Once | ORAL | Status: AC
  Administered 2024-01-21: 40 meq via ORAL
  Filled 2024-01-21: qty 2

## 2024-01-21 MED ORDER — LIVING WELL WITH DIABETES BOOK: Freq: Once | Status: AC

## 2024-01-21 NOTE — Plan of Care (Signed)

## 2024-01-21 NOTE — Progress Notes (Signed)
 OT Cancellation Note  Patient Details Name: Michael [name] MRN: [id number] DOB: [birth year]   Cancelled Treatment:    Reason Eval/Treat Not Completed: Other (comment). Pt physical therapy note, pt is full hospice thus is not appropriate for OT services. Pt will be removed from the OT treatment list.   Thurnell Floss OT, MOT   Thurnell Floss 01/21/2024, 2:00 PM

## 2024-01-21 NOTE — Plan of Care (Signed)
  Problem: Clinical Measurements: Goal: Diagnostic test results will improve Outcome: Progressing Goal: Respiratory complications will improve Outcome: Progressing   Problem: Coping: Goal: Level of anxiety will decrease Outcome: Progressing

## 2024-01-21 NOTE — Inpatient Diabetes Management (Signed)
 Inpatient Diabetes Program Recommendations  AACE/ADA: New Consensus Statement on Inpatient Glycemic Control (2015)  Target Ranges:  Prepandial:   less than 140 mg/dL      Peak postprandial:   less than 180 mg/dL (1-2 hours)      Critically ill patients:  140 - 180 mg/dL   Lab Results  Component Value Date   GLUCAP 153 (H) 01/21/2024   HGBA1C 9.4 (H) 01/20/2024    Review of Glycemic Control  Diabetes history: DM2 Outpatient Diabetes medications: None Current orders for Inpatient glycemic control: Novolog  0-9 units tid, 0-5 units hs  Inpatient Diabetes Program Recommendations:   A1c 9.4. Ordered Living Well With diabetes booklet for patient review. Will follow during hospitalization. May need to add oral DM medication on discharge.  Thank you, Trelon Plush E. Adron Geisel, RN, MSN, CDCES  Diabetes Coordinator Inpatient Glycemic Control Team Team Pager [phone] (8am-5pm) 01/21/2024 8:35 AM

## 2024-01-21 NOTE — Progress Notes (Signed)
 Progress Note   Patient: Michael [name] [id number]:[id number] DOB: 1946-05-31 DOA: 01/20/2024     0 DOS: the patient was seen and examined on 01/21/2024   Brief hospital admission narrative course: As per H&P written by Dr. Mason Sole on 01/20/2024 Michael [name] is a 78 y.o. male with medical history significant for combined CHF, seizures, NASH, type 2 diabetes, hypertension, hypothyroidism, dementia, and venous stasis dermatitis who presented from [facility] with hypotension.  He is typically wheelchair-bound and niece at bedside states that he was having some headaches this morning and difficulty urinating as well.  His facility could not collect an adequate urine sample and they feared that he may have UTI.  They sent him to the ED for further evaluation and workup.  Apparently, he has been under hospice care due to his heart failure over the last 1 year.   ED Course: Vital signs with some soft blood pressure readings and temperature 100.4.  Noted to have hemoglobin levels 10.3 and sodium 120.  Creatinine 2.48 and BUN 56.  Glucose 230, BNP 126, magnesium  1.4.  CT imaging with no significant findings noted and urine analysis within normal limits.  Chest x-ray within normal limits.  Blood cultures ordered and pending and patient received some IV antibiotics on admission.  Foley placed in ED.  Assessment and plan  AKI (acute kidney injury) (HCC) - In the setting of dehydration, obstructive uropathy by BPH and hypotension - Continue to hold diuretics and avoid hypotension - Continue IV fluids - Follow renal function trend maintain adequate hydration. -Foley catheter placed in the ED - Continue treatment with Flomax .  Hyperlipidemia - Continue statin.  Gastroesophageal reflux disease - Continue PPI.  Hypothyroidism - Continue Synthroid .  DNR (do not resuscitate) - Wishes respected - DNR CODE STATUS in place.  Chronic pain syndrome -  Chronic obstructive lung disease (HCC) - No acute  exacerbation currently appreciated - Continue bronchodilator management.  Benign prostatic hyperplasia with urinary frequency - Foley catheter placed at time of admission due to urinary retention symptom - Continue Flomax  - Outpatient follow-up with urology recommended.  Seizure disorder (HCC) - Continue Keppra .  Chronic diastolic CHF (congestive heart failure) (HCC) - Stable and compensated - Continue to follow daily weights, strict I's and O's and low-sodium diet - Holding diuretics in the setting of acute kidney injury.  Type 2 diabetes with nephropathy (HCC) - Continue sliding scale insulin  and modified carbohydrate diet - Follow CBG fluctuation and adjust hypoglycemic regimen as required - Patient also with polyneuropathy and will hold gabapentin  until further improvement in renal function achieved.  Hypokalemia/hypomagnesemia - Continue to replete electrolytes and follow trend.  Subjective:  Generalized weakness, fatigue and chronic deconditioning appreciated.  Patient expressed feeling slightly better and just complaining of mild headache.  Physical Exam: Vitals:   01/20/24 2117 01/21/24 0039 01/21/24 0412 01/21/24 1407  BP: (!) 102/51 104/70 (!) 104/51 (!) 106/50  Pulse: 89 88 79 90  Resp: 16 13 18 16   Temp: (!) 97.5 F (36.4 C) 98.7 F (37.1 C) 98 F (36.7 C) 99.1 F (37.3 C)  TempSrc: Oral Oral Oral Oral  SpO2: 97% 96% 94% 95%   General exam: Alert, awake, oriented x 3; in no acute distress. Respiratory system: No wheezing, no crackles, good saturation on room air. Cardiovascular system:RRR. No rubs or gallops; no JVD. Gastrointestinal system: Abdomen is nondistended, soft and nontender. No organomegaly or masses felt. Normal bowel sounds heard. Central nervous system: No focal neurological deficits. Extremities: No cyanosis  or clubbing; trace edema appreciated bilaterally.  Chronic venous stasis changes seen on exam. Skin: No petechiae. Psychiatry: Flat  affect.  Data Reviewed: Basic metabolic panel: Sodium 125, potassium 3.4, chloride 89, bicarb 25, BUN 51, creatinine 2.2 and GFR 30 Magnesium : 1.6 CBC: WBCs 4.2, hemoglobin 9.0 and platelet count 133K  Family Communication: No family at bedside.  Disposition: Status is: Inpatient Remains inpatient appropriate because: Continue IV therapy.  Planning to return back to assisted living facility at discharge.  Time spent: 50 minutes  Author: Justina Oman, MD 01/21/2024 4:34 PM  For on call review [email or web address].

## 2024-01-21 NOTE — Assessment & Plan Note (Signed)
 Continue PPI ?

## 2024-01-21 NOTE — Evaluation (Addendum)
 Physical Therapy Evaluation Patient Details Name: Michael [name] MRN: [id number] DOB: [birth year] Today's Date: 01/21/2024  History of Present Illness  a 78 y.o. male with medical history significant for combined CHF, seizures, NASH, type 2 diabetes, hypertension, hypothyroidism, dementia, and venous stasis dermatitis who presented from [facility] with hypotension.  He is typically wheelchair-bound and niece at bedside states that he was having some headaches this morning and difficulty urinating as well.  His facility could not collect an adequate urine sample and they feared that he may have UTI.  They sent him to the ED for further evaluation and workup.  Apparently, he has been under hospice care due to his heart failure over the last 1 year.  Clinical Impression   Pt tolerated today's Physical Therapy Evaluation, decently but was severely limited due to muscular weakness and fatigue. Pt's baseline is unknown currently, previous TOC notes report non-ambulatory and assisted with ADLs by previous staff at his [facility]. Pt reported that he is from home and was walking with RW and independent with ADLs. Pt is poor historian. Currently, pt demonstrating significant functional limitations in functional mobility, transfers, ADLs and ambulation due to severe muscle weakness. Unknown length of time regarding current functional status. Discussion with TOC reported that pt is full hospice and plan is to return with full hospice. PT will discharge from pt from current caseload at this time, as no acute skilled PT needs are indicated at this time.              If plan is discharge home, recommend the following: A lot of help with walking and/or transfers;Two people to help with walking and/or transfers;A lot of help with bathing/dressing/bathroom;Two people to help with bathing/dressing/bathroom   Can travel by private vehicle   No    Equipment Recommendations None recommended by PT  Recommendations for  Other Services       Functional Status Assessment Patient has had a recent decline in their functional status and demonstrates the ability to make significant improvements in function in a reasonable and predictable amount of time.     Precautions / Restrictions Precautions Precautions: None Restrictions Weight Bearing Restrictions Per Provider Order: No      Mobility  Bed Mobility Overal bed mobility: Needs Assistance Bed Mobility: Supine to Sit, Sit to Supine     Supine to sit: Total assist Sit to supine: Total assist   General bed mobility comments: total assist for supine to sit and return with more command following. Slow, labord with assist needed to move BLE. Seated at EOB max assist for sitting balance with posterior lean. Trendelenberg position with max assist of draw sheet to place superiorly at [facility].    Transfers                   General transfer comment: Not indicated-too weak    Ambulation/Gait               General Gait Details: Not indicated-too weak  Stairs            Wheelchair Mobility     Tilt Bed    Modified Rankin (Stroke Patients Only)       Balance Overall balance assessment: Needs assistance Sitting-balance support: Bilateral upper extremity supported, Feet unsupported Sitting balance-Leahy Scale: Poor Sitting balance - Comments: moderate posterior lean Postural control: Posterior lean  Pertinent Vitals/Pain Pain Assessment Pain Assessment: Faces Faces Pain Scale: Hurts little more Pain Location: Headache on L side    Home Living Family/patient expects to be discharged to:: Assisted living Living Arrangements: Alone               Home Equipment: Rolling Walker (2 wheels);Wheelchair - manual      Prior Function Prior Level of Function : Needs assist       Physical Assist : Mobility (physical);ADLs (physical)     Mobility Comments: Poor historian,  reports walking with a walker. Pt reports not using WC at all. ADLs Comments: Poor historian, pt reports showering and dressing by himself, f     Extremity/Trunk Assessment   Upper Extremity Assessment Upper Extremity Assessment: Defer to OT evaluation    Lower Extremity Assessment Lower Extremity Assessment: RLE deficits/detail;LLE deficits/detail RLE Deficits / Details: 2+/5 supine grossly in bed RLE Sensation: WNL LLE Deficits / Details: 2+/5 supine grossly in bed LLE Sensation: WNL    Cervical / Trunk Assessment Cervical / Trunk Assessment: Kyphotic  Communication   Communication Communication: No apparent difficulties    Cognition Arousal: Lethargic Behavior During Therapy: Flat affect   PT - Cognitive impairments: History of cognitive impairments, Memory                         Following commands: Impaired Following commands impaired: Follows one step commands inconsistently     Cueing Cueing Techniques: Tactile cues, Verbal cues     General Comments      Exercises     Assessment/Plan    PT Assessment Patient needs continued PT services  PT Problem List Decreased strength;Decreased activity tolerance;Decreased balance;Decreased mobility       PT Treatment Interventions Functional mobility training;Therapeutic activities;Therapeutic exercise;Balance training;Neuromuscular re-education    PT Goals (Current goals can be found in the Care Plan section)  Acute Rehab PT Goals Patient Stated Goal: return back to prior Dc setting. PT Goal Formulation: With patient Time For Goal Achievement: 02/04/24 Potential to Achieve Goals: Good    Frequency Min 3X/week     Co-evaluation               AM-PAC PT "6 Clicks" Mobility  Outcome Measure Help needed turning from your back to your side while in a flat bed without using bedrails?: Total Help needed moving from lying on your back to sitting on the side of a flat bed without using bedrails?:  Total Help needed moving to and from a bed to a chair (including a wheelchair)?: Total Help needed standing up from a chair using your arms (e.g., wheelchair or bedside chair)?: Total Help needed to walk in hospital room?: Total Help needed climbing 3-5 steps with a railing? : Total 6 Click Score: 6    End of Session     Patient left: in bed;with call bell/phone within reach;with bed alarm set Nurse Communication: Mobility status PT Visit Diagnosis: Other abnormalities of gait and mobility (R26.89);Muscle weakness (generalized) (M62.81)    Time: 3244-0102 PT Time Calculation (min) (ACUTE ONLY): 22 min   Charges:   PT Evaluation $PT Eval Low Complexity: 1 Low   PT General Charges $$ ACUTE PT VISIT: 1 Visit         Astrid Lay, DPT [facility] [place] [address] [phone] office  Gatha Kaska 01/21/2024, 10:28 AM

## 2024-01-21 NOTE — Progress Notes (Signed)
 Patient states that his hands are hurting a 10 out of 10. Patients states that this is new for him. This nurse administered tylenol  and [facility] aware.

## 2024-01-21 NOTE — Assessment & Plan Note (Signed)
-   Stable and compensated - Continue to follow daily weights, strict I's and O's and low-sodium diet - Holding diuretics in the setting of acute kidney injury.

## 2024-01-21 NOTE — Assessment & Plan Note (Addendum)
-   In the setting of dehydration, obstructive uropathy by BPH and hypotension - Continue to hold diuretics and avoid hypotension - Continue IV fluids - Follow renal function trend maintain adequate hydration. -Foley catheter placed in the ED - Continue treatment with Flomax .

## 2024-01-21 NOTE — Assessment & Plan Note (Signed)
 Continue Synthroid

## 2024-01-21 NOTE — Assessment & Plan Note (Signed)
-   Foley catheter placed at time of admission due to urinary retention symptom - Continue Flomax  - Outpatient follow-up with urology recommended.

## 2024-01-21 NOTE — Assessment & Plan Note (Signed)
-   No acute exacerbation currently appreciated - Continue bronchodilator management.

## 2024-01-21 NOTE — Care Management Obs Status (Signed)
 MEDICARE OBSERVATION STATUS NOTIFICATION   Patient Details  Name: Michael [name] MRN: [id number] Date of Birth: 1946-08-27   Medicare Observation Status Notification Given:  Yes    Geraldina Klinefelter, RN 01/21/2024, 3:30 PM

## 2024-01-21 NOTE — Assessment & Plan Note (Signed)
 Michael [name]

## 2024-01-21 NOTE — Assessment & Plan Note (Signed)
-   Wishes respected - DNR CODE STATUS in place.

## 2024-01-21 NOTE — Assessment & Plan Note (Signed)
 Continue statin.

## 2024-01-21 NOTE — Assessment & Plan Note (Signed)
 Continue Keppra

## 2024-01-21 NOTE — Assessment & Plan Note (Signed)
-   Continue sliding scale insulin  and modified carbohydrate diet - Follow CBG fluctuation and adjust hypoglycemic regimen as required - Patient also with polyneuropathy and will hold gabapentin  until further improvement in renal function achieved.

## 2024-01-21 NOTE — TOC Progression Note (Signed)
 Transition of Care [facility]) - Progression Note    Patient Details  Name: Michael [name] MRN: [id number] Date of Birth: 1946/07/27  [facility] CM/SW Contact  Orelia Binet, RN Phone Number: 01/21/2024, 10:32 AM  Clinical Narrative:   CM spoke with Thersia Flax at [facility]. Patient is there with full hospice care provided by [facility]. He is not ambulatory. Hospice nurse sent him to hospital to better control blood pressure. He will return to ALF with hospice in 1 to 2 days. Thersia Flax approved.     Expected Discharge Plan: Assisted Living Barriers to Discharge: Continued Medical Work up  Expected Discharge Plan and Services In-house Referral: Clinical Social Work Discharge Planning Services: CM Consult   Living arrangements for the past 2 months: Assisted Living Facility                   Social Determinants of Health (SDOH) Interventions SDOH Screenings   Food Insecurity: No Food Insecurity (01/20/2024)  Housing: Low Risk  (01/20/2024)  Transportation Needs: No Transportation Needs (01/20/2024)  Utilities: Not At Risk (01/20/2024)  Alcohol Screen: Low Risk  (04/18/2022)  Depression (PHQ2-9): Low Risk  (06/20/2022)  Financial Resource Strain: Low Risk  (04/18/2022)  Physical Activity: Inactive (04/18/2022)  Social Connections: Unknown (01/20/2024)  Stress: No Stress Concern Present (04/18/2022)  Tobacco Use: Medium Risk (01/20/2024)    Readmission Risk Interventions    04/12/2023    5:39 PM 04/11/2023    4:24 PM 11/19/2022   11:00 AM  Readmission Risk Prevention Plan  Transportation Screening  Complete Complete  PCP or Specialist Appt within 3-5 Days  Complete   HRI or Home Care Consult -- Not Complete   Social Work Consult for Recovery Care Planning/Counseling Complete Complete   Palliative Care Screening Complete Not Applicable   Medication Review Oceanographer) Complete Complete Complete  HRI or Home Care Consult   Complete  SW Recovery Care/Counseling Consult    Complete  Palliative Care Screening   Not Applicable  Skilled Nursing Facility   Not Applicable

## 2024-01-22 DIAGNOSIS — E782 Mixed hyperlipidemia: Secondary | ICD-10-CM

## 2024-01-22 DIAGNOSIS — K219 Gastro-esophageal reflux disease without esophagitis: Secondary | ICD-10-CM

## 2024-01-22 DIAGNOSIS — G40909 Epilepsy, unspecified, not intractable, without status epilepticus: Secondary | ICD-10-CM | POA: Diagnosis not present

## 2024-01-22 DIAGNOSIS — J449 Chronic obstructive pulmonary disease, unspecified: Secondary | ICD-10-CM | POA: Diagnosis not present

## 2024-01-22 DIAGNOSIS — R350 Frequency of micturition: Secondary | ICD-10-CM

## 2024-01-22 DIAGNOSIS — N401 Enlarged prostate with lower urinary tract symptoms: Secondary | ICD-10-CM

## 2024-01-22 DIAGNOSIS — N179 Acute kidney failure, unspecified: Secondary | ICD-10-CM | POA: Diagnosis not present

## 2024-01-22 LAB — GLUCOSE, CAPILLARY
Glucose-Capillary: 148 mg/dL — ABNORMAL HIGH (ref 70–99)
Glucose-Capillary: 220 mg/dL — ABNORMAL HIGH (ref 70–99)

## 2024-01-22 LAB — BASIC METABOLIC PANEL WITH GFR
Anion gap: 12 (ref 5–15)
BUN: 41 mg/dL — ABNORMAL HIGH (ref 8–23)
CO2: 24 mmol/L (ref 22–32)
Calcium: 8.4 mg/dL — ABNORMAL LOW (ref 8.9–10.3)
Chloride: 95 mmol/L — ABNORMAL LOW (ref 98–111)
Creatinine, Ser: 1.73 mg/dL — ABNORMAL HIGH (ref 0.61–1.24)
GFR, Estimated: 40 mL/min — ABNORMAL LOW (ref >60)
Glucose, Bld: 152 mg/dL — ABNORMAL HIGH (ref 70–99)
Potassium: 3.8 mmol/L (ref 3.5–5.1)
Sodium: 131 mmol/L — ABNORMAL LOW (ref 135–145)

## 2024-01-22 LAB — MAGNESIUM: Magnesium: 1.6 mg/dL — ABNORMAL LOW (ref 1.7–2.4)

## 2024-01-22 MED ORDER — GABAPENTIN 300 MG PO CAPS
300.0000 mg | ORAL_CAPSULE | Freq: Two times a day (BID) | ORAL | 1 refills | Status: AC
Start: 2024-01-22 — End: ?

## 2024-01-22 MED ORDER — MAGNESIUM SULFATE IN D5W 1-5 GM/100ML-% IV SOLN
1.0000 g | Freq: Once | INTRAVENOUS | Status: AC
  Administered 2024-01-22: 1 g via INTRAVENOUS
  Filled 2024-01-22: qty 100

## 2024-01-22 MED ORDER — ENTRESTO 24-26 MG PO TABS: 1.0000 | ORAL_TABLET | Freq: Two times a day (BID) | ORAL | 2 refills | Status: AC

## 2024-01-22 MED ORDER — BUMETANIDE 2 MG PO TABS: 2.0000 mg | ORAL_TABLET | Freq: Every day | ORAL | Status: AC

## 2024-01-22 MED ORDER — OXYCODONE HCL 15 MG PO TABS: 15.0000 mg | ORAL_TABLET | Freq: Three times a day (TID) | ORAL | Status: AC

## 2024-01-22 NOTE — Discharge Summary (Addendum)
 Physician Discharge Summary   Patient: Michael [name] MRN: [id number] DOB: June 25, 1946  Admit date:     01/20/2024  Discharge date: 01/22/24  Discharge Physician: Justina Oman   PCP: [facility]   Recommendations at discharge:  Repeat basic metabolic panel to follow electrolytes and renal function Reassess blood pressure and adjust antihypertensive as needed Make sure patient follow-up with neurology service as instructed for voiding trials and if needed cystoscopy. Follow CBG fluctuation and further adjust hypoglycemic regimen as required.   Discharge Diagnoses: Principal Problem:   AKI (acute kidney injury) (HCC) Active Problems:   Hyperlipidemia   Gastroesophageal reflux disease   Hypothyroidism   DNR (do not resuscitate)   Anxiety   Chronic obstructive lung disease (HCC)   Type 2 diabetes with nephropathy (HCC)   Gout   Benign prostatic hyperplasia with urinary frequency   Acute renal failure superimposed on stage 3a chronic kidney disease (HCC)   Seizure disorder (HCC)   Chronic diastolic CHF (congestive heart failure) (HCC)  Brief hospital admission narrative course: As per H&P written by Dr. Mason Sole on 01/20/2024 Michael [name] is a 78 y.o. male with medical history significant for combined CHF, seizures, NASH, type 2 diabetes, hypertension, hypothyroidism, dementia, and venous stasis dermatitis who presented from [facility] with hypotension.  He is typically wheelchair-bound and niece at bedside states that he was having some headaches this morning and difficulty urinating as well.  His facility could not collect an adequate urine sample and they feared that he may have UTI.  They sent him to the ED for further evaluation and workup.  Apparently, he has been under hospice care due to his heart failure over the last 1 year.   ED Course: Vital signs with some soft blood pressure readings and temperature 100.4.  Noted to have hemoglobin levels 10.3 and sodium 120.   Creatinine 2.48 and BUN 56.  Glucose 230, BNP 126, magnesium  1.4.  CT imaging with no significant findings noted and urine analysis within normal limits.  Chest x-ray within normal limits.  Blood cultures ordered and pending and patient received some IV antibiotics on admission.  Foley placed in ED.  Assessment and Plan: AKI (acute kidney injury) (HCC) - In the setting of dehydration, obstructive uropathy by BPH and hypotension - Improved and back to baseline at discharge - Continue minimizing nephrotoxic agents - Diuretics and GDMT drugs has been adjusted at discharge - Patient advised to maintain adequate hydration - Repeat basic metabolic panel follow-up visit to follow renal function trend. -Foley catheter placed in the ED; patient has been continuing Flomax  and will follow-up with urology service as an outpatient.   Hyperlipidemia - Continue statin.   Gastroesophageal reflux disease - Continue PPI.   Hypothyroidism - Continue Synthroid .   DNR (do not resuscitate) - Wishes respected - DNR CODE STATUS in place.   Chronic pain syndrome - Continue home analgesic therapy.   Chronic obstructive lung disease (HCC) - No acute exacerbation currently appreciated - Continue bronchodilator management.   Benign prostatic hyperplasia with urinary frequency - Foley catheter placed at time of admission due to urinary retention symptom - Continue Flomax  - Outpatient follow-up with urology recommended; patient has been discharged with Foley catheter in place..   Seizure disorder (HCC) - Continue Keppra . -No seizure activity appreciated.   Chronic diastolic CHF (congestive heart failure) (HCC) - Stable and compensated - Continue to follow daily weights, strict I's and O's and low-sodium diet - Resume adjusted dose of diuretics and  GDMT. - Continue patient follow-up with cardiology service.   Type 2 diabetes with nephropathy (HCC) - Continue modify carbohydrate diet; patient advised  to maintain adequate hydration. -Continue to follow CBG fluctuation and further adjust hypoglycemic regimen as required.  Hypokalemia/hypomagnesemia - Continue to replete electrolytes and follow trend.  Consultants: None Procedures performed: See below for x-ray reports. Disposition: Assisted living Diet recommendation: Healthy/low-sodium and modified carbohydrate diet.  DISCHARGE MEDICATION: Allergies as of 01/22/2024   No Known Allergies      Medication List     STOP taking these medications    Entresto  49-51 MG Generic drug: sacubitril -valsartan  Replaced by: Entresto  24-26 MG   ibuprofen 200 MG tablet Commonly known as: ADVIL   loperamide  2 MG tablet Commonly known as: IMODIUM  A-D   metolazone  2.5 MG tablet Commonly known as: ZAROXOLYN    potassium chloride  10 MEQ tablet Commonly known as: KLOR-CON        TAKE these medications    acetaminophen  325 MG tablet Commonly known as: TYLENOL  Take 650 mg by mouth every 6 (six) hours as needed for mild pain (pain score 1-3).   allopurinol  100 MG tablet Commonly known as: ZYLOPRIM  Take 100 mg by mouth daily.   bumetanide  2 MG tablet Commonly known as: BUMEX  Take 1 tablet (2 mg total) by mouth daily. Start taking on: January 24, 2024 What changed:  how much to take These instructions start on January 24, 2024. If you are unsure what to do until then, ask your doctor or other care provider.   chlorhexidine  0.12 % solution Commonly known as: PERIDEX  Use as directed 15 mLs in the mouth or throat 2 (two) times daily.   diphenhydrAMINE 25 MG tablet Commonly known as: BENADRYL Take 25 mg by mouth every 4 (four) hours as needed for allergies (allergic reaction).   Entresto  24-26 MG Generic drug: sacubitril -valsartan  Take 1 tablet by mouth 2 (two) times daily. Start taking on: January 26, 2024 Replaces: Entresto  49-51 MG   gabapentin  300 MG capsule Commonly known as: NEURONTIN  Take 1 capsule (300 mg total) by mouth 2  (two) times daily. What changed:  medication strength how much to take when to take this   hydrocortisone  cream 1 % Apply 1 Application topically 3 (three) times daily as needed for itching (rash).   lactulose  10 GM/15ML solution Commonly known as: CHRONULAC  Take 10 g by mouth 3 (three) times daily.   levETIRAcetam  500 MG tablet Commonly known as: KEPPRA  Take 1 tablet (500 mg total) by mouth 2 (two) times daily.   levothyroxine  100 MCG tablet Commonly known as: SYNTHROID  Take 100 mcg by mouth daily before breakfast.   metoprolol  tartrate 25 MG tablet Commonly known as: LOPRESSOR  Take 1 tablet (25 mg total) by mouth 2 (two) times daily. What changed: additional instructions   nystatin  powder Commonly known as: MYCOSTATIN /NYSTOP  Apply 1 Application topically 3 (three) times daily as needed (rash). What changed: Another medication with the same name was removed. Continue taking this medication, and follow the directions you see here.   oxyCODONE  15 MG immediate release tablet Commonly known as: ROXICODONE  Take 1 tablet (15 mg total) by mouth 3 (three) times daily.   pantoprazole  40 MG tablet Commonly known as: PROTONIX  Take 1 tablet (40 mg total) by mouth daily.   Pataday  0.1 % ophthalmic solution Generic drug: olopatadine  Place 1 drop into both eyes 2 (two) times daily.   ROBITUSSIN COLD & COUGH PO Take 10 mLs by mouth every 6 (six) hours as needed (cough).  Do not exceed 4 doses in 24 hours, if cough has not improved in 24 hours contact MD   sertraline  50 MG tablet Commonly known as: ZOLOFT  Take 50 mg by mouth daily.   tamsulosin  0.4 MG Caps capsule Commonly known as: FLOMAX  TAKE (1) CAPSULE BY MOUTH EVERY DAY. What changed: See the new instructions.   traZODone  50 MG tablet Commonly known as: DESYREL  Take 1 tablet (50 mg total) by mouth at bedtime. TAKE 1 TABLET BY MOUTH AT BEDTIME AS NEEDED FOR SLEEP. What changed:  how much to take additional  instructions        Contact information for follow-up providers     [facility] Schedule an appointment as soon as possible for a visit in 1 week(s).   Contact information: [address] [place] [address] [phone]         Marco Severs, MD. Schedule an appointment as soon as possible for a visit in 10 day(s).   Specialty: Urology Contact information: [address]  [place] [address] [phone]              Contact information for after-discharge care     Destination     [facility] .   Service: Assisted Living Contact information: [phone] [place]  [address] [phone]                    Discharge Exam:  General exam: Alert, awake, oriented x 3; in no acute distress.  Feeling ready for discharge. Respiratory system: No wheezing, no crackles, good saturation on room air appreciated. Cardiovascular system:RRR. No rubs or gallops; no JVD.   Gastrointestinal system: Abdomen is nondistended, soft and nontender. No organomegaly or masses felt. Normal bowel sounds heard. Central nervous system: No focal neurological deficits. Extremities: No cyanosis or clubbing; trace edema appreciated bilaterally.  Chronic venous stasis changes seen on exam. Skin: No petechiae. Psychiatry: Flat affect.   Condition at discharge: Stable and improved.  The results of significant diagnostics from this hospitalization (including imaging, microbiology, ancillary and laboratory) are listed below for reference.   Imaging Studies: US  Venous Img Lower Unilateral Left Result Date: 01/20/2024 CLINICAL DATA:  Left lower extremity swelling. EXAM: Left LOWER EXTREMITY VENOUS DOPPLER ULTRASOUND TECHNIQUE: Gray-scale sonography with compression, as well as color and duplex ultrasound, were performed to evaluate the deep venous system(s) from the level of the common femoral  vein through the popliteal and proximal calf veins. COMPARISON:  Ultrasound 05/13/2016. FINDINGS: VENOUS Normal compressibility of the common femoral, superficial femoral, and popliteal veins, as well as the visualized calf veins. Visualized portions of profunda femoral vein and great saphenous vein unremarkable. No filling defects to suggest DVT on grayscale or color Doppler imaging. Doppler waveforms show normal direction of venous flow, normal respiratory plasticity and response to augmentation. Limited views of the contralateral common femoral vein are unremarkable. OTHER None. Limitations: none IMPRESSION: No evidence of left lower extremity DVT. Electronically Signed   By: Adrianna Horde M.D.   On: 01/20/2024 15:47   CT CHEST ABDOMEN PELVIS WO CONTRAST Result Date: 01/20/2024 CLINICAL DATA:  Hypotension with concern for sepsis. EXAM: CT CHEST, ABDOMEN AND PELVIS WITHOUT CONTRAST TECHNIQUE: Multidetector CT imaging of the chest, abdomen and pelvis was performed following the standard protocol without IV contrast. RADIATION DOSE REDUCTION: This exam was performed according to the departmental dose-optimization program which includes automated exposure control, adjustment of the mA and/or kV according  to patient size and/or use of iterative reconstruction technique. COMPARISON:  CT chest, abdomen, and pelvis dated 04/10/2023. FINDINGS: CT CHEST FINDINGS Cardiovascular: Normal heart size. No pericardial effusion. Thoracic aorta is normal in caliber with atherosclerotic calcification. Multivessel coronary artery calcifications. Mediastinum/Nodes: No enlarged mediastinal, hilar, or axillary lymph nodes. Thyroid  gland, trachea, and esophagus demonstrate no significant findings. Lungs/Pleura: Motion degradation limits evaluation, most notably at the lung bases. No focal consolidation, pleural effusion, or pneumothorax. Musculoskeletal: No acute osseous abnormality. No suspicious osseous lesion. Multilevel  degenerative changes of the thoracic spine with similar anterior wedge deformity of T1 and grade 1 anterolisthesis of C7 on T1. CT ABDOMEN PELVIS FINDINGS Hepatobiliary: Similar mild hepatic contour nodularity. No suspicious focal lesion identified within the limits of an unenhanced exam. Gallbladder is unremarkable. No biliary dilatation. Pancreas: No focal lesions or main ductal dilation. Spleen: Normal in size without focal abnormality. Adrenals/Urinary Tract: Adrenal glands are unremarkable. No urolithiasis or hydronephrosis. Similar left-greater-than-right nonspecific perinephric stranding. Bladder is collapsed with Foley catheter in place and circumferential wall thickening. Stomach/Bowel: Stomach is within normal limits. Appendix appears normal. No evidence of bowel wall thickening, distention, or inflammatory changes. Colonic diverticulosis without evidence of acute diverticulitis. Vascular/Lymphatic: Aortic atherosclerosis. No enlarged abdominal or pelvic lymph nodes. Reproductive: Unremarkable. Other: No abdominopelvic ascites. No intraperitoneal free air. Fat containing lower ventral abdominal wall hernia. Musculoskeletal: Severe degenerative changes of the bilateral hips with complete joint space loss, morphological flattening of the bilateral femoral heads, and extensive subchondral cystic changes. Multilevel degenerative changes of the thoracic spine. No acute osseous abnormality. IMPRESSION: 1. Bladder is collapsed with Foley catheter in place and circumferential wall thickening, which may be secondary to underdistention or cystitis. Similar left-greater-than-right nonspecific perinephric stranding could be secondary to chronic changes or ascending infection. 2. Colonic diverticulosis without evidence of acute diverticulitis. 3.  Aortic Atherosclerosis (ICD10-I70.0). 4. Additional unchanged chronic findings, as described above. Electronically Signed   By: Mannie Seek M.D.   On: 01/20/2024 14:06    DG Chest Port 1 View Result Date: 01/20/2024 CLINICAL DATA:  Hypotension. EXAM: PORTABLE CHEST 1 VIEW COMPARISON:  April 10, 2023. FINDINGS: The heart size and mediastinal contours are within normal limits. Both lungs are clear. The visualized skeletal structures are unremarkable. IMPRESSION: No active disease. Electronically Signed   By: Rosalene Colon M.D.   On: 01/20/2024 12:13    Microbiology: Results for orders placed or performed during the hospital encounter of 01/20/24  Resp panel by RT-PCR (RSV, Flu A&B, Covid) Anterior Nasal Swab     Status: None   Collection Time: 01/20/24 10:47 AM   Specimen: Anterior Nasal Swab  Result Value Ref Range Status   SARS Coronavirus 2 by RT PCR NEGATIVE NEGATIVE Final    Comment: (NOTE) SARS-CoV-2 target nucleic acids are NOT DETECTED.  The SARS-CoV-2 RNA is generally detectable in upper respiratory specimens during the acute phase of infection. The lowest concentration of SARS-CoV-2 viral copies this assay can detect is 138 copies/mL. A negative result does not preclude SARS-Cov-2 infection and should not be used as the sole basis for treatment or other patient management decisions. A negative result may occur with  improper specimen collection/handling, submission of specimen other than nasopharyngeal swab, presence of viral mutation(s) within the areas targeted by this assay, and inadequate number of viral copies(<138 copies/mL). A negative result must be combined with clinical observations, patient history, and epidemiological information. The expected result is Negative.  Fact Sheet for Patients:  BloggerCourse.com  Fact Sheet  for Healthcare Providers:  SeriousBroker.it  This test is no t yet approved or cleared by the [place]  FDA and  has been authorized for detection and/or diagnosis of SARS-CoV-2 by FDA under an Emergency Use Authorization (EUA). This EUA will remain  in  effect (meaning this test can be used) for the duration of the COVID-19 declaration under Section 564(b)(1) of the Act, 21 U.S.C.section 360bbb-3(b)(1), unless the authorization is terminated  or revoked sooner.       Influenza A by PCR NEGATIVE NEGATIVE Final   Influenza B by PCR NEGATIVE NEGATIVE Final    Comment: (NOTE) The Xpert Xpress SARS-CoV-2/FLU/RSV plus assay is intended as an aid in the diagnosis of influenza from Nasopharyngeal swab specimens and should not be used as a sole basis for treatment. Nasal washings and aspirates are unacceptable for Xpert Xpress SARS-CoV-2/FLU/RSV testing.  Fact Sheet for Patients: BloggerCourse.com  Fact Sheet for Healthcare Providers: SeriousBroker.it  This test is not yet approved or cleared by the [place]  FDA and has been authorized for detection and/or diagnosis of SARS-CoV-2 by FDA under an Emergency Use Authorization (EUA). This EUA will remain in effect (meaning this test can be used) for the duration of the COVID-19 declaration under Section 564(b)(1) of the Act, 21 U.S.C. section 360bbb-3(b)(1), unless the authorization is terminated or revoked.     Resp Syncytial Virus by PCR NEGATIVE NEGATIVE Final    Comment: (NOTE) Fact Sheet for Patients: BloggerCourse.com  Fact Sheet for Healthcare Providers: SeriousBroker.it  This test is not yet approved or cleared by the [place]  FDA and has been authorized for detection and/or diagnosis of SARS-CoV-2 by FDA under an Emergency Use Authorization (EUA). This EUA will remain in effect (meaning this test can be used) for the duration of the COVID-19 declaration under Section 564(b)(1) of the Act, 21 U.S.C. section 360bbb-3(b)(1), unless the authorization is terminated or revoked.  Performed at [facility], [address], [place], [place] [address]   Blood Culture (routine  x 2)     Status: None (Preliminary result)   Collection Time: 01/20/24 11:28 AM   Specimen: BLOOD  Result Value Ref Range Status   Specimen Description BLOOD RIGHT ANTECUBITAL  Final   Special Requests   Final    BOTTLES DRAWN AEROBIC AND ANAEROBIC Blood Culture adequate volume   Culture   Final    NO GROWTH 2 DAYS Performed at [facility], [address] [place], [place] [address]    Report Status PENDING  Incomplete  Blood Culture (routine x 2)     Status: None (Preliminary result)   Collection Time: 01/20/24 11:28 AM   Specimen: BLOOD  Result Value Ref Range Status   Specimen Description BLOOD BLOOD LEFT ARM  Final   Special Requests   Final    BOTTLES DRAWN AEROBIC AND ANAEROBIC Blood Culture results may not be optimal due to an inadequate volume of blood received in culture bottles   Culture   Final    NO GROWTH 2 DAYS Performed at [facility], [address], [place], [place] [address]    Report Status PENDING  Incomplete  MRSA Next Gen by PCR, Nasal     Status: None   Collection Time: 01/21/24  5:40 AM   Specimen: Nasal Mucosa; Nasal Swab  Result Value Ref Range Status   MRSA by PCR Next Gen NOT DETECTED NOT DETECTED Final    Comment: (NOTE) The GeneXpert MRSA Assay (FDA approved for NASAL specimens only), is one component  of a comprehensive MRSA colonization surveillance program. It is not intended to diagnose MRSA infection nor to guide or monitor treatment for MRSA infections. Test performance is not FDA approved in patients less than 31 years old. Performed at [facility], [address], [place], [place] [address]    *Note: Due to a large number of results and/or encounters for the requested time period, some results have not been displayed. A complete set of results can be found in Results Review.    Labs: CBC: Recent Labs  Lab 01/20/24 1128 01/21/24 0407  WBC 6.9 4.2  NEUTROABS 5.4  --   HGB 10.3* 9.0*  HCT 29.4* 25.3*  MCV 82.6 82.7  PLT 169 133*    Basic Metabolic Panel: Recent Labs  Lab 01/20/24 1128 01/21/24 0407 01/22/24 0441  NA 120* 125* 131*  K 4.1 3.4* 3.8  CL 83* 89* 95*  CO2 24 25 24   GLUCOSE 230* 166* 152*  BUN 56* 51* 41*  CREATININE 2.48* 2.22* 1.73*  CALCIUM  8.0* 8.0* 8.4*  MG 1.4* 1.6* 1.6*   Liver Function Tests: Recent Labs  Lab 01/20/24 1128  AST 21  ALT 15  ALKPHOS 136*  BILITOT 0.6  PROT 6.4*  ALBUMIN 3.4*   CBG: Recent Labs  Lab 01/21/24 1115 01/21/24 1604 01/21/24 2136 01/22/24 0739 01/22/24 1120  GLUCAP 158* 144* 144* 148* 220*    Discharge time spent: greater than 30 minutes.  Signed: Justina Oman, MD [facility] 01/22/2024

## 2024-01-22 NOTE — Progress Notes (Signed)
 Pt discharged via WC to [facility] for return to [facility].

## 2024-01-22 NOTE — Plan of Care (Signed)

## 2024-01-22 NOTE — [place] FL2 (Signed)
 [place]  MEDICAID FL2 LEVEL OF CARE FORM     IDENTIFICATION  Patient Name: Michael [name] Birthdate: [birth year] Sex: male Admission Date (Current Location): 01/20/2024  [place] and [place] Number:  [employer] and Address:  [facility],  [address] [place] [address]      Provider Number: [id number]  Attending Physician Name and Address:  Justina Oman, MD  Relative Name and Phone Number:  Corinthia Dickinson (Niece)  [phone]    Current Level of Care: Hospital Recommended Level of Care: Assisted Living Facility Prior Approval Number:    Date Approved/Denied:   PASRR Number:    Discharge Plan:  [facility] [place])    Current Diagnoses: Patient Active Problem List   Diagnosis Date Noted   Seizure disorder (HCC) 01/21/2024   Chronic diastolic CHF (congestive heart failure) (HCC) 01/21/2024   Multifocal atrial tachycardia (HCC) 04/14/2023   Sepsis due to undetermined organism (HCC) 04/10/2023   Cystitis with hematuria 04/10/2023   Hypercalcemia 04/10/2023   Acute renal failure superimposed on stage 3a chronic kidney disease (HCC) 04/10/2023   UTI (urinary tract infection) 11/18/2022   AKI (acute kidney injury) (HCC) 11/18/2022   Severe sepsis (HCC) 11/18/2022   Acute metabolic encephalopathy 11/18/2022   Tonic-clonic seizure (HCC) 10/01/2022   Pressure injury of skin 10/01/2022   Paroxysmal atrial fibrillation with RVR (HCC) 10/01/2022   Venous stasis dermatitis 10/01/2022   Disorientation 09/19/2022   Hypertensive crisis 09/18/2022   Encounter for general adult medical examination with abnormal findings 06/20/2022   Onychomycosis 06/20/2022   Physical deconditioning 12/21/2021   Acute cystitis without hematuria 07/10/2021   Benign prostatic hyperplasia with urinary frequency 07/10/2021   Vitamin D  deficiency 05/23/2021   Anxiety 01/11/2021   Chronic obstructive lung disease (HCC) 01/11/2021   Contact dermatitis and other eczema  01/11/2021   Type 2 diabetes with nephropathy (HCC) 01/11/2021   Diabetic peripheral neuropathy (HCC) 01/11/2021   Gout 01/11/2021   History of colonic polyps 01/11/2021   Ventral hernia 01/11/2021   Neck pain 11/20/2020   Lymphedema of lower extremity 11/18/2018   Chronic pain syndrome 05/06/2018   Depression, major, single episode, mild (HCC) 06/20/2017   DNR (do not resuscitate) 06/20/2017   Chronic HFrEF (heart failure with reduced ejection fraction) (HCC) 10/23/2016   Hypothyroidism 11/30/2014   Sinus pause 07/21/2014   Difficulty walking 05/03/2014   Acute anemia 07/14/2013   Spinal stenosis of lumbar region 07/06/2013   Carpal tunnel syndrome 04/13/2013   DDD (degenerative disc disease), lumbosacral 04/13/2013   Peripheral neuropathy 01/05/2013   S/P arthroscopy of left knee 11/09/2012   Osteoarthritis of knee 10/07/2012   NASH (nonalcoholic steatohepatitis) 05/22/2007   Hyperlipidemia 10/15/2006   Class 3 obesity 10/15/2006   Hypertension 10/15/2006   Gastroesophageal reflux disease 10/15/2006    Orientation RESPIRATION BLADDER Height & Weight     Self  Normal External catheter Weight:   Height:     BEHAVIORAL SYMPTOMS/MOOD NEUROLOGICAL BOWEL NUTRITION STATUS      Continent Diet (Heart healthy, carb modified)  AMBULATORY STATUS COMMUNICATION OF NEEDS Skin   Extensive Assist (Non-ambulatory) Verbally Normal                       Personal Care Assistance Level of Assistance  Bathing, Feeding Bathing Assistance: Limited assistance Feeding assistance: Limited assistance       Functional Limitations Info  Sight, Hearing, Speech Sight Info: Impaired Hearing Info: Impaired Speech Info: Adequate  SPECIAL CARE FACTORS FREQUENCY                       Contractures Contractures Info: Not present    Additional Factors Info  Code Status, Allergies Code Status Info: DNR - limited: If pulseless and not breathing, no CPR or chest compressions.  In  pre-arrest conditions (patient is breathing and has a pulse), do not intubate. Provide all appropriate non-invasive medical interventions. Allergies Info: None known           Current Medications (01/22/2024):  This is the current hospital active medication list Current Facility-Administered Medications  Medication Dose Route Frequency Provider Last Rate Last Admin   acetaminophen  (TYLENOL ) tablet 650 mg  650 mg Oral Q6H PRN Shah, Pratik D, DO   650 mg at 01/22/24 1140   Or   acetaminophen  (TYLENOL ) suppository 650 mg  650 mg Rectal Q6H PRN Mason Sole, Pratik D, DO       allopurinol  (ZYLOPRIM ) tablet 100 mg  100 mg Oral Daily Mason Sole, Pratik D, DO   100 mg at 01/22/24 1141   Chlorhexidine  Gluconate Cloth 2 % PADS 6 each  6 each Topical Q0600 Doreene Gammon D, DO   6 each at 01/22/24 4696   heparin  injection 5,000 Units  5,000 Units Subcutaneous Q8H Shah, Pratik D, DO   5,000 Units at 01/22/24 2952   insulin  aspart (novoLOG ) injection 0-5 Units  0-5 Units Subcutaneous QHS Mason Sole, Pratik D, DO       insulin  aspart (novoLOG ) injection 0-9 Units  0-9 Units Subcutaneous TID WC Mason Sole, Pratik D, DO   3 Units at 01/22/24 1152   lactulose  (CHRONULAC ) 10 GM/15ML solution 10 g  10 g Oral TID Shah, Pratik D, DO   10 g at 01/22/24 1140   levETIRAcetam  (KEPPRA ) tablet 500 mg  500 mg Oral BID Mason Sole, Pratik D, DO   500 mg at 01/22/24 1141   levothyroxine  (SYNTHROID ) tablet 100 mcg  100 mcg Oral QAC breakfast Doreene Gammon D, DO   100 mcg at 01/22/24 8413   liver oil-zinc  oxide (DESITIN) 40 % ointment   Topical Daily Mason Sole, Pratik D, DO   Given at 01/22/24 0800   nystatin  (MYCOSTATIN /NYSTOP ) topical powder   Topical Once Mason Sole, Pratik D, DO       nystatin  (MYCOSTATIN /NYSTOP ) topical powder   Topical BID Mason Sole, Pratik D, DO   Given at 01/22/24 0800   olopatadine  (PATANOL) 0.1 % ophthalmic solution 1 drop  1 drop Both Eyes BID Mason Sole, Pratik D, DO   1 drop at 01/22/24 1144   ondansetron  (ZOFRAN ) tablet 4 mg  4 mg Oral Q6H PRN Mason Sole,  Pratik D, DO       Or   ondansetron  (ZOFRAN ) injection 4 mg  4 mg Intravenous Q6H PRN Mason Sole, Pratik D, DO       pantoprazole  (PROTONIX ) EC tablet 40 mg  40 mg Oral Daily Mason Sole, Pratik D, DO   40 mg at 01/22/24 1144   sertraline  (ZOLOFT ) tablet 50 mg  50 mg Oral Daily Mason Sole, Pratik D, DO   50 mg at 01/22/24 1141   tamsulosin  (FLOMAX ) capsule 0.4 mg  0.4 mg Oral Daily Shah, Pratik D, DO   0.4 mg at 01/22/24 1141     Discharge Medications: Allergies as of 01/22/2024   No Known Allergies      Medication List     STOP taking these medications    Entresto  49-51 MG Generic drug: sacubitril -valsartan  Replaced by: Entresto   24-26 MG   ibuprofen 200 MG tablet Commonly known as: ADVIL   loperamide  2 MG tablet Commonly known as: IMODIUM  A-D   metolazone  2.5 MG tablet Commonly known as: ZAROXOLYN    potassium chloride  10 MEQ tablet Commonly known as: KLOR-CON        TAKE these medications    acetaminophen  325 MG tablet Commonly known as: TYLENOL  Take 650 mg by mouth every 6 (six) hours as needed for mild pain (pain score 1-3).   allopurinol  100 MG tablet Commonly known as: ZYLOPRIM  Take 100 mg by mouth daily.   bumetanide  2 MG tablet Commonly known as: BUMEX  Take 1 tablet (2 mg total) by mouth daily. Start taking on: January 24, 2024 What changed:  how much to take These instructions start on January 24, 2024. If you are unsure what to do until then, ask your doctor or other care provider.   chlorhexidine  0.12 % solution Commonly known as: PERIDEX  Use as directed 15 mLs in the mouth or throat 2 (two) times daily.   diphenhydrAMINE 25 MG tablet Commonly known as: BENADRYL Take 25 mg by mouth every 4 (four) hours as needed for allergies (allergic reaction).   Entresto  24-26 MG Generic drug: sacubitril -valsartan  Take 1 tablet by mouth 2 (two) times daily. Start taking on: January 26, 2024 Replaces: Entresto  49-51 MG   gabapentin  300 MG capsule Commonly known as:  NEURONTIN  Take 1 capsule (300 mg total) by mouth 2 (two) times daily. What changed:  medication strength how much to take when to take this   hydrocortisone  cream 1 % Apply 1 Application topically 3 (three) times daily as needed for itching (rash).   lactulose  10 GM/15ML solution Commonly known as: CHRONULAC  Take 10 g by mouth 3 (three) times daily.   levETIRAcetam  500 MG tablet Commonly known as: KEPPRA  Take 1 tablet (500 mg total) by mouth 2 (two) times daily.   levothyroxine  100 MCG tablet Commonly known as: SYNTHROID  Take 100 mcg by mouth daily before breakfast.   metoprolol  tartrate 25 MG tablet Commonly known as: LOPRESSOR  Take 1 tablet (25 mg total) by mouth 2 (two) times daily. What changed: additional instructions   nystatin  powder Commonly known as: MYCOSTATIN /NYSTOP  Apply 1 Application topically 3 (three) times daily as needed (rash). What changed: Another medication with the same name was removed. Continue taking this medication, and follow the directions you see here.   oxyCODONE  15 MG immediate release tablet Commonly known as: ROXICODONE  Take 1 tablet (15 mg total) by mouth 3 (three) times daily.   pantoprazole  40 MG tablet Commonly known as: PROTONIX  Take 1 tablet (40 mg total) by mouth daily.   Pataday  0.1 % ophthalmic solution Generic drug: olopatadine  Place 1 drop into both eyes 2 (two) times daily.   ROBITUSSIN COLD & COUGH PO Take 10 mLs by mouth every 6 (six) hours as needed (cough). Do not exceed 4 doses in 24 hours, if cough has not improved in 24 hours contact MD   sertraline  50 MG tablet Commonly known as: ZOLOFT  Take 50 mg by mouth daily.   tamsulosin  0.4 MG Caps capsule Commonly known as: FLOMAX  TAKE (1) CAPSULE BY MOUTH EVERY DAY. What changed: See the new instructions.   traZODone  50 MG tablet Commonly known as: DESYREL  Take 1 tablet (50 mg total) by mouth at bedtime. TAKE 1 TABLET BY MOUTH AT BEDTIME AS NEEDED FOR SLEEP. What  changed:  how much to take additional instructions         Relevant Imaging  Results:  Relevant Lab Results:   Additional Information Hospice care at the ALF   SSN: [id number]  Persephone Schriever, Ivette Marks, RN

## 2024-01-22 NOTE — TOC Transition Note (Signed)
 Transition of Care [facility]) - Discharge Note   Patient Details  Name: Michael [name] MRN: [id number] Date of Birth: 05-Sep-1946  Transition of Care [facility]) CM/SW Contact:  Orelia Binet, RN Phone Number: 01/22/2024, 1:39 PM   Clinical Narrative:   Patient is medically ready to return to [facility]. FL2 faxed to [facility]. Per Thersia Flax he will need EMS transport. CM called his niece, [name] to update her with discharge plan.  Samantha with [facility] updated. Medical necessity printed and CM called EMS.     Final next level of care: Assisted Living Barriers to Discharge: Barriers Resolved   Patient Goals and CMS Choice Patient states their goals for this hospitalization and ongoing recovery are:: Return to ALF CMS Medicare.gov Compare Post Acute Care list provided to:: Patient Choice offered to / list presented to : Patient [place] ownership interest in [facility].provided to:: Patient    Discharge Placement                Patient to be transferred to facility by: EMS Name of family member notified: [name] Patient and family notified of of transfer: 01/22/24  Discharge Plan and Services Additional resources added to the After Visit Summary for   In-house Referral: Clinical Social Work Discharge Planning Services: CM Consult                    Social Drivers of Health (SDOH) Interventions SDOH Screenings   Food Insecurity: No Food Insecurity (01/20/2024)  Housing: Low Risk  (01/20/2024)  Transportation Needs: No Transportation Needs (01/20/2024)  Utilities: Not At Risk (01/20/2024)  Alcohol Screen: Low Risk  (04/18/2022)  Depression (PHQ2-9): Low Risk  (06/20/2022)  Financial Resource Strain: Low Risk  (04/18/2022)  Physical Activity: Inactive (04/18/2022)  Social Connections: Unknown (01/20/2024)  Stress: No Stress Concern Present (04/18/2022)  Tobacco Use: Medium Risk (01/20/2024)     Readmission Risk Interventions    04/12/2023    5:39 PM 04/11/2023    4:24 PM  11/19/2022   11:00 AM  Readmission Risk Prevention Plan  Transportation Screening  Complete Complete  PCP or Specialist Appt within 3-5 Days  Complete   HRI or Home Care Consult -- Not Complete   Social Work Consult for Recovery Care Planning/Counseling Complete Complete   Palliative Care Screening Complete Not Applicable   Medication Review [employer]) Complete Complete Complete  HRI or Home Care Consult   Complete  SW Recovery Care/Counseling Consult   Complete  Palliative Care Screening   Not Applicable  Skilled Nursing Facility   Not Applicable

## 2024-01-22 NOTE — Progress Notes (Signed)
 Pt assisted up to recliner this am. Pt able to get self to side of bed, stand and use FWW to shuffle-step to recliner. Pt reminded to stand up straight and hold head up, but able to bear weight without assistance. Chair alarm on for safety, call bell within reach. Reminded to call for needs and to not get up without assistance. Pt states understanding.

## 2024-01-23 LAB — LEGIONELLA PNEUMOPHILA SEROGP 1 UR AG: L. pneumophila Serogp 1 Ur Ag: NEGATIVE

## 2024-01-25 LAB — CULTURE, BLOOD (ROUTINE X 2)
Culture: NO GROWTH
Culture: NO GROWTH
Special Requests: ADEQUATE
